# Patient Record
Sex: Female | Born: 1940 | Race: White | Hispanic: No | Marital: Married | Smoking: Former smoker
Health system: Southern US, Community
[De-identification: ages and names within clinical notes are randomized; demographics above are authoritative.]

## PROBLEM LIST (undated history)

## (undated) ENCOUNTER — Emergency Department (HOSPITAL_BASED_OUTPATIENT_CLINIC_OR_DEPARTMENT_OTHER): Disposition: A | Discharge status: Home / Self Care | Payer: PPO

## (undated) ENCOUNTER — Emergency Department (HOSPITAL_BASED_OUTPATIENT_CLINIC_OR_DEPARTMENT_OTHER): Admission: EM | Source: Home / Self Care

## (undated) ENCOUNTER — Ambulatory Visit: Admission: EM

## (undated) DIAGNOSIS — I951 Orthostatic hypotension: Secondary | ICD-10-CM

## (undated) DIAGNOSIS — M25562 Pain in left knee: Secondary | ICD-10-CM

## (undated) DIAGNOSIS — L729 Follicular cyst of the skin and subcutaneous tissue, unspecified: Secondary | ICD-10-CM

## (undated) DIAGNOSIS — R0600 Dyspnea, unspecified: Secondary | ICD-10-CM

## (undated) DIAGNOSIS — Z8041 Family history of malignant neoplasm of ovary: Secondary | ICD-10-CM

## (undated) DIAGNOSIS — Z9289 Personal history of other medical treatment: Secondary | ICD-10-CM

## (undated) DIAGNOSIS — M8580 Other specified disorders of bone density and structure, unspecified site: Secondary | ICD-10-CM

## (undated) DIAGNOSIS — R700 Elevated erythrocyte sedimentation rate: Secondary | ICD-10-CM

## (undated) DIAGNOSIS — T7840XA Allergy, unspecified, initial encounter: Secondary | ICD-10-CM

## (undated) DIAGNOSIS — J449 Chronic obstructive pulmonary disease, unspecified: Secondary | ICD-10-CM

## (undated) DIAGNOSIS — R350 Frequency of micturition: Secondary | ICD-10-CM

## (undated) DIAGNOSIS — J189 Pneumonia, unspecified organism: Secondary | ICD-10-CM

## (undated) DIAGNOSIS — Z8619 Personal history of other infectious and parasitic diseases: Secondary | ICD-10-CM

## (undated) DIAGNOSIS — I739 Peripheral vascular disease, unspecified: Secondary | ICD-10-CM

## (undated) DIAGNOSIS — R109 Unspecified abdominal pain: Secondary | ICD-10-CM

## (undated) DIAGNOSIS — M1990 Unspecified osteoarthritis, unspecified site: Secondary | ICD-10-CM

## (undated) DIAGNOSIS — K5520 Angiodysplasia of colon without hemorrhage: Secondary | ICD-10-CM

## (undated) DIAGNOSIS — J439 Emphysema, unspecified: Secondary | ICD-10-CM

## (undated) DIAGNOSIS — W19XXXA Unspecified fall, initial encounter: Secondary | ICD-10-CM

## (undated) DIAGNOSIS — R1310 Dysphagia, unspecified: Secondary | ICD-10-CM

## (undated) DIAGNOSIS — I779 Disorder of arteries and arterioles, unspecified: Secondary | ICD-10-CM

## (undated) DIAGNOSIS — K222 Esophageal obstruction: Secondary | ICD-10-CM

## (undated) DIAGNOSIS — E785 Hyperlipidemia, unspecified: Secondary | ICD-10-CM

## (undated) DIAGNOSIS — F419 Anxiety disorder, unspecified: Secondary | ICD-10-CM

## (undated) DIAGNOSIS — S52133A Displaced fracture of neck of unspecified radius, initial encounter for closed fracture: Secondary | ICD-10-CM

## (undated) DIAGNOSIS — R748 Abnormal levels of other serum enzymes: Secondary | ICD-10-CM

## (undated) DIAGNOSIS — L309 Dermatitis, unspecified: Secondary | ICD-10-CM

## (undated) DIAGNOSIS — C801 Malignant (primary) neoplasm, unspecified: Secondary | ICD-10-CM

## (undated) DIAGNOSIS — F329 Major depressive disorder, single episode, unspecified: Secondary | ICD-10-CM

## (undated) DIAGNOSIS — F1021 Alcohol dependence, in remission: Secondary | ICD-10-CM

## (undated) DIAGNOSIS — D689 Coagulation defect, unspecified: Secondary | ICD-10-CM

## (undated) DIAGNOSIS — Z800 Family history of malignant neoplasm of digestive organs: Secondary | ICD-10-CM

## (undated) DIAGNOSIS — R319 Hematuria, unspecified: Secondary | ICD-10-CM

## (undated) DIAGNOSIS — F1910 Other psychoactive substance abuse, uncomplicated: Secondary | ICD-10-CM

## (undated) DIAGNOSIS — J45909 Unspecified asthma, uncomplicated: Secondary | ICD-10-CM

## (undated) DIAGNOSIS — Z853 Personal history of malignant neoplasm of breast: Secondary | ICD-10-CM

## (undated) DIAGNOSIS — Z0000 Encounter for general adult medical examination without abnormal findings: Secondary | ICD-10-CM

## (undated) DIAGNOSIS — D649 Anemia, unspecified: Secondary | ICD-10-CM

## (undated) DIAGNOSIS — R0789 Other chest pain: Secondary | ICD-10-CM

## (undated) DIAGNOSIS — F0390 Unspecified dementia without behavioral disturbance: Secondary | ICD-10-CM

## (undated) DIAGNOSIS — K200 Eosinophilic esophagitis: Secondary | ICD-10-CM

## (undated) DIAGNOSIS — I493 Ventricular premature depolarization: Secondary | ICD-10-CM

## (undated) DIAGNOSIS — J069 Acute upper respiratory infection, unspecified: Secondary | ICD-10-CM

## (undated) DIAGNOSIS — J019 Acute sinusitis, unspecified: Secondary | ICD-10-CM

## (undated) DIAGNOSIS — M7120 Synovial cyst of popliteal space [Baker], unspecified knee: Secondary | ICD-10-CM

## (undated) DIAGNOSIS — N760 Acute vaginitis: Secondary | ICD-10-CM

## (undated) DIAGNOSIS — M810 Age-related osteoporosis without current pathological fracture: Secondary | ICD-10-CM

## (undated) DIAGNOSIS — M25561 Pain in right knee: Secondary | ICD-10-CM

## (undated) DIAGNOSIS — I10 Essential (primary) hypertension: Secondary | ICD-10-CM

## (undated) DIAGNOSIS — Z803 Family history of malignant neoplasm of breast: Secondary | ICD-10-CM

## (undated) DIAGNOSIS — K219 Gastro-esophageal reflux disease without esophagitis: Secondary | ICD-10-CM

## (undated) DIAGNOSIS — Z9221 Personal history of antineoplastic chemotherapy: Secondary | ICD-10-CM

## (undated) DIAGNOSIS — E782 Mixed hyperlipidemia: Secondary | ICD-10-CM

## (undated) DIAGNOSIS — T1490XA Injury, unspecified, initial encounter: Principal | ICD-10-CM

## (undated) DIAGNOSIS — Z923 Personal history of irradiation: Secondary | ICD-10-CM

## (undated) HISTORY — DX: Orthostatic hypotension: I95.1

## (undated) HISTORY — DX: Family history of malignant neoplasm of ovary: Z80.41

## (undated) HISTORY — DX: Family history of malignant neoplasm of breast: Z80.3

## (undated) HISTORY — DX: Coagulation defect, unspecified: D68.9

## (undated) HISTORY — PX: ESOPHAGOGASTRODUODENOSCOPY (EGD) WITH ESOPHAGEAL DILATION: SHX5812

## (undated) HISTORY — DX: Other specified disorders of bone density and structure, unspecified site: M85.80

## (undated) HISTORY — DX: Acute upper respiratory infection, unspecified: J06.9

## (undated) HISTORY — DX: Hyperlipidemia, unspecified: E78.5

## (undated) HISTORY — DX: Unspecified abdominal pain: R10.9

## (undated) HISTORY — DX: Dysphagia, unspecified: R13.10

## (undated) HISTORY — DX: Anxiety disorder, unspecified: F41.9

## (undated) HISTORY — PX: APPENDECTOMY: SHX54

## (undated) HISTORY — DX: Pain in left knee: M25.562

## (undated) HISTORY — DX: Elevated erythrocyte sedimentation rate: R70.0

## (undated) HISTORY — DX: Unspecified osteoarthritis, unspecified site: M19.90

## (undated) HISTORY — DX: Family history of malignant neoplasm of digestive organs: Z80.0

## (undated) HISTORY — DX: Allergy, unspecified, initial encounter: T78.40XA

## (undated) HISTORY — DX: Acute vaginitis: N76.0

## (undated) HISTORY — DX: Age-related osteoporosis without current pathological fracture: M81.0

## (undated) HISTORY — DX: Unspecified asthma, uncomplicated: J45.909

## (undated) HISTORY — DX: Other psychoactive substance abuse, uncomplicated: F19.10

## (undated) HISTORY — DX: Personal history of other medical treatment: Z92.89

## (undated) HISTORY — DX: Emphysema, unspecified: J43.9

## (undated) HISTORY — DX: Hematuria, unspecified: R31.9

## (undated) HISTORY — DX: Injury, unspecified, initial encounter: T14.90XA

## (undated) HISTORY — DX: Unspecified dementia, unspecified severity, without behavioral disturbance, psychotic disturbance, mood disturbance, and anxiety: F03.90

## (undated) HISTORY — DX: Angiodysplasia of colon without hemorrhage: K55.20

## (undated) HISTORY — DX: Follicular cyst of the skin and subcutaneous tissue, unspecified: L72.9

## (undated) HISTORY — DX: Unspecified fall, initial encounter: W19.XXXA

## (undated) HISTORY — DX: Esophageal obstruction: K22.2

## (undated) HISTORY — DX: Dermatitis, unspecified: L30.9

## (undated) HISTORY — DX: Personal history of malignant neoplasm of breast: Z85.3

## (undated) HISTORY — DX: Major depressive disorder, single episode, unspecified: F32.9

## (undated) HISTORY — DX: Synovial cyst of popliteal space (Baker), unspecified knee: M71.20

## (undated) HISTORY — DX: Encounter for general adult medical examination without abnormal findings: Z00.00

## (undated) HISTORY — DX: Disorder of arteries and arterioles, unspecified: I77.9

## (undated) HISTORY — PX: REDUCTION MAMMAPLASTY: SUR839

## (undated) HISTORY — DX: Frequency of micturition: R35.0

## (undated) HISTORY — DX: Chronic obstructive pulmonary disease, unspecified: J44.9

## (undated) HISTORY — DX: Gastro-esophageal reflux disease without esophagitis: K21.9

## (undated) HISTORY — DX: Anemia, unspecified: D64.9

## (undated) HISTORY — PX: BREAST RECONSTRUCTION: SHX9

## (undated) HISTORY — DX: Ventricular premature depolarization: I49.3

## (undated) HISTORY — DX: Other chest pain: R07.89

## (undated) HISTORY — DX: Acute sinusitis, unspecified: J01.90

## (undated) HISTORY — DX: Pain in right knee: M25.561

## (undated) HISTORY — DX: Abnormal levels of other serum enzymes: R74.8

## (undated) HISTORY — DX: Pneumonia, unspecified organism: J18.9

## (undated) HISTORY — DX: Peripheral vascular disease, unspecified: I73.9

## (undated) HISTORY — DX: Malignant (primary) neoplasm, unspecified: C80.1

## (undated) HISTORY — DX: Personal history of other infectious and parasitic diseases: Z86.19

## (undated) HISTORY — DX: Displaced fracture of neck of unspecified radius, initial encounter for closed fracture: S52.133A

## (undated) HISTORY — DX: Mixed hyperlipidemia: E78.2

## (undated) HISTORY — DX: Alcohol dependence, in remission: F10.21

---

## 1998-12-16 ENCOUNTER — Other Ambulatory Visit: Admission: RE | Admit: 1998-12-16 | Discharge: 1998-12-16 | Payer: Self-pay | Admitting: Family Medicine

## 1999-11-13 DIAGNOSIS — Z9221 Personal history of antineoplastic chemotherapy: Secondary | ICD-10-CM

## 1999-11-13 DIAGNOSIS — Z923 Personal history of irradiation: Secondary | ICD-10-CM

## 1999-11-13 HISTORY — PX: BREAST LUMPECTOMY: SHX2

## 1999-11-13 HISTORY — DX: Personal history of antineoplastic chemotherapy: Z92.21

## 1999-11-13 HISTORY — DX: Personal history of irradiation: Z92.3

## 2000-01-22 ENCOUNTER — Encounter: Payer: Self-pay | Admitting: Surgery

## 2000-01-22 ENCOUNTER — Encounter (INDEPENDENT_AMBULATORY_CARE_PROVIDER_SITE_OTHER): Payer: Self-pay

## 2000-01-22 ENCOUNTER — Ambulatory Visit (HOSPITAL_COMMUNITY): Admission: RE | Admit: 2000-01-22 | Discharge: 2000-01-22 | Payer: Self-pay | Admitting: Surgery

## 2000-02-09 ENCOUNTER — Encounter: Admission: RE | Admit: 2000-02-09 | Discharge: 2000-02-09 | Payer: Self-pay | Admitting: Family Medicine

## 2000-02-09 ENCOUNTER — Encounter: Payer: Self-pay | Admitting: Family Medicine

## 2000-02-16 ENCOUNTER — Ambulatory Visit (HOSPITAL_COMMUNITY): Admission: RE | Admit: 2000-02-16 | Discharge: 2000-02-16 | Payer: Self-pay | Admitting: Surgery

## 2000-02-16 ENCOUNTER — Encounter: Payer: Self-pay | Admitting: Surgery

## 2000-02-16 ENCOUNTER — Encounter (INDEPENDENT_AMBULATORY_CARE_PROVIDER_SITE_OTHER): Payer: Self-pay | Admitting: *Deleted

## 2000-07-17 ENCOUNTER — Observation Stay (HOSPITAL_COMMUNITY): Admission: RE | Admit: 2000-07-17 | Discharge: 2000-07-18 | Payer: Self-pay | Admitting: Oncology

## 2000-08-14 ENCOUNTER — Observation Stay (HOSPITAL_COMMUNITY): Admission: RE | Admit: 2000-08-14 | Discharge: 2000-08-15 | Payer: Self-pay | Admitting: Oncology

## 2000-08-16 ENCOUNTER — Encounter (HOSPITAL_COMMUNITY): Admission: RE | Admit: 2000-08-16 | Discharge: 2000-11-14 | Payer: Self-pay | Admitting: Oncology

## 2000-09-09 ENCOUNTER — Observation Stay (HOSPITAL_COMMUNITY): Admission: AD | Admit: 2000-09-09 | Discharge: 2000-09-10 | Payer: Self-pay | Admitting: Oncology

## 2000-10-07 ENCOUNTER — Encounter: Admission: RE | Admit: 2000-10-07 | Discharge: 2001-01-05 | Payer: Self-pay | Admitting: Radiation Oncology

## 2000-12-31 ENCOUNTER — Other Ambulatory Visit: Admission: RE | Admit: 2000-12-31 | Discharge: 2000-12-31 | Payer: Self-pay | Admitting: Obstetrics and Gynecology

## 2001-01-06 ENCOUNTER — Encounter: Admission: RE | Admit: 2001-01-06 | Discharge: 2001-04-06 | Payer: Self-pay | Admitting: Radiation Oncology

## 2001-04-11 ENCOUNTER — Encounter: Admission: RE | Admit: 2001-04-11 | Discharge: 2001-04-11 | Payer: Self-pay | Admitting: Oncology

## 2001-04-11 ENCOUNTER — Encounter: Payer: Self-pay | Admitting: Oncology

## 2001-04-11 ENCOUNTER — Ambulatory Visit (HOSPITAL_COMMUNITY): Admission: RE | Admit: 2001-04-11 | Discharge: 2001-04-11 | Payer: Self-pay | Admitting: Oncology

## 2001-04-16 ENCOUNTER — Ambulatory Visit (HOSPITAL_COMMUNITY): Admission: RE | Admit: 2001-04-16 | Discharge: 2001-04-16 | Payer: Self-pay | Admitting: Oncology

## 2001-04-16 ENCOUNTER — Encounter: Payer: Self-pay | Admitting: Oncology

## 2001-04-23 ENCOUNTER — Ambulatory Visit: Admission: RE | Admit: 2001-04-23 | Discharge: 2001-07-22 | Payer: Self-pay | Admitting: *Deleted

## 2002-07-21 ENCOUNTER — Encounter: Admission: RE | Admit: 2002-07-21 | Discharge: 2002-07-21 | Payer: Self-pay | Admitting: Family Medicine

## 2002-07-21 ENCOUNTER — Encounter: Payer: Self-pay | Admitting: Family Medicine

## 2003-07-24 ENCOUNTER — Encounter: Payer: Self-pay | Admitting: Emergency Medicine

## 2003-07-24 ENCOUNTER — Emergency Department (HOSPITAL_COMMUNITY): Admission: EM | Admit: 2003-07-24 | Discharge: 2003-07-25 | Payer: Self-pay | Admitting: Emergency Medicine

## 2003-11-16 ENCOUNTER — Encounter: Admission: RE | Admit: 2003-11-16 | Discharge: 2003-11-16 | Payer: Self-pay

## 2004-02-01 ENCOUNTER — Ambulatory Visit (HOSPITAL_COMMUNITY): Admission: RE | Admit: 2004-02-01 | Discharge: 2004-02-01 | Payer: Self-pay | Admitting: Oncology

## 2004-10-24 ENCOUNTER — Encounter: Admission: RE | Admit: 2004-10-24 | Discharge: 2004-10-24 | Payer: Self-pay | Admitting: Oncology

## 2004-11-22 ENCOUNTER — Ambulatory Visit: Payer: Self-pay | Admitting: Oncology

## 2004-11-22 ENCOUNTER — Encounter: Admission: RE | Admit: 2004-11-22 | Discharge: 2004-11-22 | Payer: Self-pay | Admitting: Oncology

## 2005-05-24 ENCOUNTER — Ambulatory Visit: Payer: Self-pay | Admitting: Oncology

## 2005-05-25 ENCOUNTER — Ambulatory Visit (HOSPITAL_COMMUNITY): Admission: RE | Admit: 2005-05-25 | Discharge: 2005-05-25 | Payer: Self-pay | Admitting: Oncology

## 2005-05-30 ENCOUNTER — Encounter: Admission: RE | Admit: 2005-05-30 | Discharge: 2005-05-30 | Payer: Self-pay | Admitting: Oncology

## 2005-07-12 ENCOUNTER — Ambulatory Visit: Payer: Self-pay | Admitting: Oncology

## 2005-09-03 ENCOUNTER — Ambulatory Visit: Payer: Self-pay | Admitting: Oncology

## 2005-12-11 ENCOUNTER — Encounter: Admission: RE | Admit: 2005-12-11 | Discharge: 2005-12-11 | Payer: Self-pay | Admitting: Oncology

## 2006-04-29 ENCOUNTER — Ambulatory Visit: Payer: Self-pay | Admitting: Oncology

## 2006-05-01 LAB — CBC WITH DIFFERENTIAL/PLATELET
BASO%: 0.1 % (ref 0.0–2.0)
Basophils Absolute: 0 10*3/uL (ref 0.0–0.1)
EOS%: 5 % (ref 0.0–7.0)
HCT: 38.7 % (ref 34.8–46.6)
LYMPH%: 19.9 % (ref 14.0–48.0)
MCH: 31.5 pg (ref 26.0–34.0)
MCHC: 34.1 g/dL (ref 32.0–36.0)
MCV: 92.3 fL (ref 81.0–101.0)
MONO%: 8.3 % (ref 0.0–13.0)
NEUT%: 66.7 % (ref 39.6–76.8)
Platelets: 251 10*3/uL (ref 145–400)
lymph#: 1.3 10*3/uL (ref 0.9–3.3)

## 2006-05-01 LAB — COMPREHENSIVE METABOLIC PANEL
Albumin: 3.8 g/dL (ref 3.5–5.2)
Alkaline Phosphatase: 80 U/L (ref 39–117)
BUN: 24 mg/dL — ABNORMAL HIGH (ref 6–23)
Creatinine, Ser: 0.72 mg/dL (ref 0.40–1.20)
Glucose, Bld: 89 mg/dL (ref 70–99)
Total Bilirubin: 0.4 mg/dL (ref 0.3–1.2)

## 2006-07-29 ENCOUNTER — Ambulatory Visit: Payer: Self-pay | Admitting: Oncology

## 2006-10-25 ENCOUNTER — Ambulatory Visit: Payer: Self-pay | Admitting: Oncology

## 2006-10-30 LAB — COMPREHENSIVE METABOLIC PANEL
ALT: 17 U/L (ref 0–35)
AST: 15 U/L (ref 0–37)
Alkaline Phosphatase: 99 U/L (ref 39–117)
Calcium: 9.4 mg/dL (ref 8.4–10.5)
Chloride: 103 mEq/L (ref 96–112)
Creatinine, Ser: 0.72 mg/dL (ref 0.40–1.20)

## 2006-10-30 LAB — CBC WITH DIFFERENTIAL/PLATELET
BASO%: 0.5 % (ref 0.0–2.0)
EOS%: 2.6 % (ref 0.0–7.0)
HCT: 41.3 % (ref 34.8–46.6)
MCH: 31.2 pg (ref 26.0–34.0)
MCHC: 33.8 g/dL (ref 32.0–36.0)
MONO%: 7.4 % (ref 0.0–13.0)
NEUT%: 58.3 % (ref 39.6–76.8)
RDW: 13.4 % (ref 11.3–14.5)
lymph#: 1.7 10*3/uL (ref 0.9–3.3)

## 2006-11-12 DIAGNOSIS — C801 Malignant (primary) neoplasm, unspecified: Secondary | ICD-10-CM

## 2006-11-12 HISTORY — DX: Malignant (primary) neoplasm, unspecified: C80.1

## 2006-12-12 ENCOUNTER — Encounter: Admission: RE | Admit: 2006-12-12 | Discharge: 2006-12-12 | Payer: Self-pay | Admitting: Oncology

## 2006-12-26 ENCOUNTER — Encounter (INDEPENDENT_AMBULATORY_CARE_PROVIDER_SITE_OTHER): Payer: Self-pay | Admitting: *Deleted

## 2006-12-26 ENCOUNTER — Encounter: Admission: RE | Admit: 2006-12-26 | Discharge: 2006-12-26 | Payer: Self-pay | Admitting: Oncology

## 2006-12-26 HISTORY — PX: BREAST BIOPSY: SHX20

## 2007-01-02 ENCOUNTER — Encounter: Admission: RE | Admit: 2007-01-02 | Discharge: 2007-01-02 | Payer: Self-pay | Admitting: Oncology

## 2007-01-02 ENCOUNTER — Encounter (INDEPENDENT_AMBULATORY_CARE_PROVIDER_SITE_OTHER): Payer: Self-pay | Admitting: Specialist

## 2007-01-02 ENCOUNTER — Encounter (INDEPENDENT_AMBULATORY_CARE_PROVIDER_SITE_OTHER): Payer: Self-pay | Admitting: Diagnostic Radiology

## 2007-01-02 HISTORY — PX: BREAST BIOPSY: SHX20

## 2007-01-23 ENCOUNTER — Ambulatory Visit (HOSPITAL_COMMUNITY): Admission: RE | Admit: 2007-01-23 | Discharge: 2007-01-23 | Payer: Self-pay | Admitting: Surgery

## 2007-01-29 ENCOUNTER — Ambulatory Visit: Payer: Self-pay | Admitting: Oncology

## 2007-04-23 ENCOUNTER — Ambulatory Visit: Payer: Self-pay | Admitting: Psychiatry

## 2007-05-01 ENCOUNTER — Ambulatory Visit: Payer: Self-pay | Admitting: Psychiatry

## 2007-05-07 ENCOUNTER — Ambulatory Visit: Payer: Self-pay | Admitting: Psychiatry

## 2007-05-27 HISTORY — PX: AUGMENTATION MAMMAPLASTY: SUR837

## 2007-05-27 HISTORY — PX: MASTECTOMY MODIFIED RADICAL: SUR848

## 2007-06-06 ENCOUNTER — Inpatient Hospital Stay (HOSPITAL_COMMUNITY): Admission: RE | Admit: 2007-06-06 | Discharge: 2007-06-09 | Payer: Self-pay | Admitting: Surgery

## 2007-06-06 ENCOUNTER — Encounter (INDEPENDENT_AMBULATORY_CARE_PROVIDER_SITE_OTHER): Payer: Self-pay | Admitting: Surgery

## 2007-06-16 ENCOUNTER — Ambulatory Visit: Payer: Self-pay | Admitting: Oncology

## 2007-07-01 LAB — POC HEMOCCULT BLD/STL (HOME/3-CARD/SCREEN)

## 2007-08-07 ENCOUNTER — Ambulatory Visit: Payer: Self-pay | Admitting: Oncology

## 2007-08-12 LAB — CBC WITH DIFFERENTIAL/PLATELET
Basophils Absolute: 0 10*3/uL (ref 0.0–0.1)
Eosinophils Absolute: 0.2 10*3/uL (ref 0.0–0.5)
HCT: 31.9 % — ABNORMAL LOW (ref 34.8–46.6)
HGB: 11.1 g/dL — ABNORMAL LOW (ref 11.6–15.9)
MCV: 88 fL (ref 81.0–101.0)
MONO%: 5.3 % (ref 0.0–13.0)
NEUT#: 5.8 10*3/uL (ref 1.5–6.5)
Platelets: 464 10*3/uL — ABNORMAL HIGH (ref 145–400)
RDW: 13.9 % (ref 11.3–14.5)

## 2007-08-12 LAB — COMPREHENSIVE METABOLIC PANEL
Albumin: 3.6 g/dL (ref 3.5–5.2)
Alkaline Phosphatase: 173 U/L — ABNORMAL HIGH (ref 39–117)
BUN: 15 mg/dL (ref 6–23)
Calcium: 9.1 mg/dL (ref 8.4–10.5)
Glucose, Bld: 88 mg/dL (ref 70–99)
Potassium: 3.8 mEq/L (ref 3.5–5.3)

## 2007-08-12 LAB — CANCER ANTIGEN 27.29: CA 27.29: 16 U/mL (ref 0–39)

## 2007-10-02 ENCOUNTER — Ambulatory Visit: Payer: Self-pay | Admitting: Oncology

## 2007-10-06 LAB — COMPREHENSIVE METABOLIC PANEL
AST: 14 U/L (ref 0–37)
Albumin: 4.2 g/dL (ref 3.5–5.2)
Alkaline Phosphatase: 132 U/L — ABNORMAL HIGH (ref 39–117)
Glucose, Bld: 93 mg/dL (ref 70–99)
Potassium: 4.1 mEq/L (ref 3.5–5.3)
Sodium: 140 mEq/L (ref 135–145)
Total Bilirubin: 0.3 mg/dL (ref 0.3–1.2)
Total Protein: 7.1 g/dL (ref 6.0–8.3)

## 2007-10-06 LAB — CBC WITH DIFFERENTIAL/PLATELET
BASO%: 0.3 % (ref 0.0–2.0)
MCHC: 34.1 g/dL (ref 32.0–36.0)
MONO#: 0.4 10*3/uL (ref 0.1–0.9)
RBC: 4.29 10*6/uL (ref 3.70–5.32)
RDW: 14.4 % (ref 11.3–14.5)
WBC: 6.4 10*3/uL (ref 3.9–10.0)
lymph#: 1.7 10*3/uL (ref 0.9–3.3)

## 2007-11-17 ENCOUNTER — Encounter (INDEPENDENT_AMBULATORY_CARE_PROVIDER_SITE_OTHER): Payer: Self-pay | Admitting: Specialist

## 2007-11-17 ENCOUNTER — Ambulatory Visit (HOSPITAL_BASED_OUTPATIENT_CLINIC_OR_DEPARTMENT_OTHER): Admission: RE | Admit: 2007-11-17 | Discharge: 2007-11-17 | Payer: Self-pay | Admitting: Specialist

## 2007-12-04 ENCOUNTER — Ambulatory Visit: Payer: Self-pay | Admitting: Oncology

## 2007-12-09 LAB — CBC WITH DIFFERENTIAL/PLATELET
Basophils Absolute: 0.1 10*3/uL (ref 0.0–0.1)
EOS%: 7 % (ref 0.0–7.0)
Eosinophils Absolute: 0.4 10*3/uL (ref 0.0–0.5)
LYMPH%: 26.9 % (ref 14.0–48.0)
MCH: 29.1 pg (ref 26.0–34.0)
MCV: 86.5 fL (ref 81.0–101.0)
MONO%: 6.2 % (ref 0.0–13.0)
NEUT#: 3.3 10*3/uL (ref 1.5–6.5)
Platelets: 380 10*3/uL (ref 145–400)
RBC: 4.18 10*6/uL (ref 3.70–5.32)

## 2007-12-09 LAB — COMPREHENSIVE METABOLIC PANEL
AST: 15 U/L (ref 0–37)
Alkaline Phosphatase: 149 U/L — ABNORMAL HIGH (ref 39–117)
BUN: 25 mg/dL — ABNORMAL HIGH (ref 6–23)
Glucose, Bld: 88 mg/dL (ref 70–99)
Sodium: 141 mEq/L (ref 135–145)
Total Bilirubin: 0.3 mg/dL (ref 0.3–1.2)

## 2007-12-15 ENCOUNTER — Encounter: Admission: RE | Admit: 2007-12-15 | Discharge: 2007-12-15 | Payer: Self-pay | Admitting: Oncology

## 2008-01-29 ENCOUNTER — Ambulatory Visit: Payer: Self-pay | Admitting: Oncology

## 2008-02-02 LAB — COMPREHENSIVE METABOLIC PANEL
ALT: 14 U/L (ref 0–35)
AST: 16 U/L (ref 0–37)
Alkaline Phosphatase: 112 U/L (ref 39–117)
Chloride: 102 mEq/L (ref 96–112)
Creatinine, Ser: 0.76 mg/dL (ref 0.40–1.20)
Total Bilirubin: 0.3 mg/dL (ref 0.3–1.2)

## 2008-02-02 LAB — CBC WITH DIFFERENTIAL/PLATELET
BASO%: 0.6 % (ref 0.0–2.0)
EOS%: 2.6 % (ref 0.0–7.0)
HCT: 36.3 % (ref 34.8–46.6)
LYMPH%: 26.7 % (ref 14.0–48.0)
MCH: 30.8 pg (ref 26.0–34.0)
MCHC: 35 g/dL (ref 32.0–36.0)
NEUT%: 61.4 % (ref 39.6–76.8)
Platelets: 260 10*3/uL (ref 145–400)

## 2008-02-20 ENCOUNTER — Encounter: Admission: RE | Admit: 2008-02-20 | Discharge: 2008-02-20 | Payer: Self-pay | Admitting: Oncology

## 2008-06-06 ENCOUNTER — Ambulatory Visit: Payer: Self-pay | Admitting: Oncology

## 2008-06-11 LAB — CBC WITH DIFFERENTIAL/PLATELET
BASO%: 0.4 % (ref 0.0–2.0)
EOS%: 3 % (ref 0.0–7.0)
LYMPH%: 22.4 % (ref 14.0–48.0)
MCH: 30.9 pg (ref 26.0–34.0)
MCHC: 34.2 g/dL (ref 32.0–36.0)
MCV: 90.4 fL (ref 81.0–101.0)
MONO%: 6 % (ref 0.0–13.0)
Platelets: 251 10*3/uL (ref 145–400)
RBC: 4.22 10*6/uL (ref 3.70–5.32)
WBC: 5.6 10*3/uL (ref 3.9–10.0)

## 2008-06-11 LAB — COMPREHENSIVE METABOLIC PANEL
ALT: 16 U/L (ref 0–35)
Alkaline Phosphatase: 101 U/L (ref 39–117)
Sodium: 140 mEq/L (ref 135–145)
Total Bilirubin: 0.4 mg/dL (ref 0.3–1.2)
Total Protein: 7.1 g/dL (ref 6.0–8.3)

## 2008-07-13 ENCOUNTER — Encounter: Admission: RE | Admit: 2008-07-13 | Discharge: 2008-07-13 | Payer: Self-pay | Admitting: Oncology

## 2008-07-16 LAB — CBC WITH DIFFERENTIAL/PLATELET
BASO%: 0.5 % (ref 0.0–2.0)
LYMPH%: 28.8 % (ref 14.0–48.0)
MCHC: 33.9 g/dL (ref 32.0–36.0)
MONO#: 0.4 10*3/uL (ref 0.1–0.9)
Platelets: 258 10*3/uL (ref 145–400)
RBC: 4.05 10*6/uL (ref 3.70–5.32)
WBC: 5.2 10*3/uL (ref 3.9–10.0)

## 2008-07-16 LAB — COMPREHENSIVE METABOLIC PANEL
ALT: 14 U/L (ref 0–35)
Alkaline Phosphatase: 110 U/L (ref 39–117)
CO2: 30 mEq/L (ref 19–32)
Sodium: 140 mEq/L (ref 135–145)
Total Bilirubin: 0.4 mg/dL (ref 0.3–1.2)
Total Protein: 7 g/dL (ref 6.0–8.3)

## 2008-07-22 ENCOUNTER — Ambulatory Visit: Payer: Self-pay | Admitting: Oncology

## 2008-08-13 ENCOUNTER — Ambulatory Visit (HOSPITAL_COMMUNITY): Admission: RE | Admit: 2008-08-13 | Discharge: 2008-08-13 | Payer: Self-pay | Admitting: Oncology

## 2008-09-16 ENCOUNTER — Ambulatory Visit: Payer: Self-pay | Admitting: Oncology

## 2008-09-27 ENCOUNTER — Ambulatory Visit: Payer: Self-pay | Admitting: Internal Medicine

## 2008-10-06 ENCOUNTER — Ambulatory Visit: Payer: Self-pay | Admitting: Internal Medicine

## 2008-10-06 LAB — CONVERTED CEMR LAB
Cholesterol: 252 mg/dL (ref 0–200)
HDL: 67 mg/dL (ref >39.0)
VLDL: 16 mg/dL (ref 0–40)

## 2008-11-12 HISTORY — PX: ERCP: SHX60

## 2008-11-12 HISTORY — PX: CHOLECYSTECTOMY: SHX55

## 2008-12-08 ENCOUNTER — Ambulatory Visit: Payer: Self-pay | Admitting: Internal Medicine

## 2008-12-08 LAB — CONVERTED CEMR LAB: AST: 14 units/L (ref 0–37)

## 2008-12-13 DIAGNOSIS — J189 Pneumonia, unspecified organism: Secondary | ICD-10-CM

## 2008-12-13 HISTORY — DX: Pneumonia, unspecified organism: J18.9

## 2008-12-15 ENCOUNTER — Encounter: Admission: RE | Admit: 2008-12-15 | Discharge: 2008-12-15 | Payer: Self-pay | Admitting: Oncology

## 2008-12-16 ENCOUNTER — Encounter: Admission: RE | Admit: 2008-12-16 | Discharge: 2008-12-16 | Payer: Self-pay | Admitting: Oncology

## 2008-12-17 ENCOUNTER — Ambulatory Visit: Payer: Self-pay | Admitting: Oncology

## 2008-12-21 ENCOUNTER — Encounter: Payer: Self-pay | Admitting: Internal Medicine

## 2008-12-21 LAB — COMPREHENSIVE METABOLIC PANEL
ALT: 43 U/L — ABNORMAL HIGH (ref 0–35)
Albumin: 4 g/dL (ref 3.5–5.2)
CO2: 29 mEq/L (ref 19–32)
Calcium: 9 mg/dL (ref 8.4–10.5)
Chloride: 100 mEq/L (ref 96–112)
Potassium: 4.1 mEq/L (ref 3.5–5.3)
Sodium: 139 mEq/L (ref 135–145)
Total Bilirubin: 0.3 mg/dL (ref 0.3–1.2)
Total Protein: 6.9 g/dL (ref 6.0–8.3)

## 2008-12-21 LAB — CBC WITH DIFFERENTIAL/PLATELET
BASO%: 0.1 % (ref 0.0–2.0)
MCHC: 34 g/dL (ref 32.0–36.0)
MONO#: 0.7 10*3/uL (ref 0.1–0.9)
RBC: 3.85 10*6/uL (ref 3.70–5.32)
RDW: 13.8 % (ref 11.3–14.5)
WBC: 9 10*3/uL (ref 3.9–10.0)
lymph#: 1.4 10*3/uL (ref 0.9–3.3)

## 2008-12-23 ENCOUNTER — Inpatient Hospital Stay (HOSPITAL_COMMUNITY): Admission: EM | Admit: 2008-12-23 | Discharge: 2008-12-27 | Payer: Self-pay | Admitting: Emergency Medicine

## 2009-01-11 ENCOUNTER — Encounter: Payer: Self-pay | Admitting: Internal Medicine

## 2009-01-11 LAB — COMPREHENSIVE METABOLIC PANEL
ALT: 26 U/L (ref 0–35)
BUN: 10 mg/dL (ref 6–23)
CO2: 32 mEq/L (ref 19–32)
Calcium: 9 mg/dL (ref 8.4–10.5)
Chloride: 102 mEq/L (ref 96–112)
Creatinine, Ser: 0.68 mg/dL (ref 0.40–1.20)
Glucose, Bld: 108 mg/dL — ABNORMAL HIGH (ref 70–99)
Total Bilirubin: 0.4 mg/dL (ref 0.3–1.2)

## 2009-01-12 ENCOUNTER — Encounter: Admission: RE | Admit: 2009-01-12 | Discharge: 2009-01-12 | Payer: Self-pay | Admitting: Family Medicine

## 2009-01-13 ENCOUNTER — Ambulatory Visit: Payer: Self-pay

## 2009-02-07 ENCOUNTER — Ambulatory Visit: Payer: Self-pay | Admitting: Oncology

## 2009-02-10 ENCOUNTER — Encounter: Payer: Self-pay | Admitting: Internal Medicine

## 2009-02-10 LAB — CANCER ANTIGEN 27.29: CA 27.29: 15 U/mL (ref 0–39)

## 2009-02-10 LAB — COMPREHENSIVE METABOLIC PANEL
ALT: 113 U/L — ABNORMAL HIGH (ref 0–35)
AST: 24 U/L (ref 0–37)
Albumin: 3.8 g/dL (ref 3.5–5.2)
Alkaline Phosphatase: 246 U/L — ABNORMAL HIGH (ref 39–117)
Glucose, Bld: 84 mg/dL (ref 70–99)
Potassium: 3.6 mEq/L (ref 3.5–5.3)
Sodium: 138 mEq/L (ref 135–145)
Total Bilirubin: 0.3 mg/dL (ref 0.3–1.2)
Total Protein: 7.1 g/dL (ref 6.0–8.3)

## 2009-02-17 ENCOUNTER — Ambulatory Visit (HOSPITAL_COMMUNITY): Admission: RE | Admit: 2009-02-17 | Discharge: 2009-02-17 | Payer: Self-pay | Admitting: Oncology

## 2009-02-17 ENCOUNTER — Encounter (INDEPENDENT_AMBULATORY_CARE_PROVIDER_SITE_OTHER): Payer: Self-pay | Admitting: *Deleted

## 2009-03-15 ENCOUNTER — Ambulatory Visit: Payer: Self-pay | Admitting: Internal Medicine

## 2009-03-15 DIAGNOSIS — R748 Abnormal levels of other serum enzymes: Secondary | ICD-10-CM

## 2009-03-15 DIAGNOSIS — R079 Chest pain, unspecified: Secondary | ICD-10-CM

## 2009-03-15 HISTORY — DX: Abnormal levels of other serum enzymes: R74.8

## 2009-03-18 ENCOUNTER — Telehealth: Payer: Self-pay | Admitting: Internal Medicine

## 2009-03-21 ENCOUNTER — Telehealth: Payer: Self-pay | Admitting: Internal Medicine

## 2009-03-22 ENCOUNTER — Encounter: Admission: RE | Admit: 2009-03-22 | Discharge: 2009-03-22 | Payer: Self-pay | Admitting: Internal Medicine

## 2009-03-22 ENCOUNTER — Telehealth: Payer: Self-pay | Admitting: Internal Medicine

## 2009-03-29 ENCOUNTER — Ambulatory Visit (HOSPITAL_COMMUNITY): Admission: RE | Admit: 2009-03-29 | Discharge: 2009-03-29 | Payer: Self-pay | Admitting: Internal Medicine

## 2009-03-29 ENCOUNTER — Ambulatory Visit: Payer: Self-pay | Admitting: Internal Medicine

## 2009-03-29 DIAGNOSIS — K222 Esophageal obstruction: Secondary | ICD-10-CM

## 2009-03-29 HISTORY — DX: Esophageal obstruction: K22.2

## 2009-03-30 ENCOUNTER — Telehealth: Payer: Self-pay | Admitting: Internal Medicine

## 2009-04-07 ENCOUNTER — Telehealth: Payer: Self-pay | Admitting: Internal Medicine

## 2009-04-07 ENCOUNTER — Ambulatory Visit: Payer: Self-pay | Admitting: Gastroenterology

## 2009-04-07 LAB — CONVERTED CEMR LAB
ALT: 33 units/L (ref 0–35)
Alkaline Phosphatase: 211 units/L — ABNORMAL HIGH (ref 39–117)
Basophils Absolute: 0 10*3/uL (ref 0.0–0.1)
HCT: 34.4 % — ABNORMAL LOW (ref 36.0–46.0)
Lymphs Abs: 1.1 10*3/uL (ref 0.7–4.0)
MCV: 86.1 fL (ref 78.0–100.0)
Monocytes Absolute: 0.6 10*3/uL (ref 0.1–1.0)
Platelets: 246 10*3/uL (ref 150.0–400.0)
RDW: 14.4 % (ref 11.5–14.6)
Sodium: 140 meq/L (ref 135–145)
Total Bilirubin: 0.6 mg/dL (ref 0.3–1.2)
Total Protein: 7.2 g/dL (ref 6.0–8.3)

## 2009-04-08 ENCOUNTER — Inpatient Hospital Stay (HOSPITAL_COMMUNITY): Admission: AD | Admit: 2009-04-08 | Discharge: 2009-04-11 | Payer: Self-pay | Admitting: General Surgery

## 2009-04-08 ENCOUNTER — Encounter (INDEPENDENT_AMBULATORY_CARE_PROVIDER_SITE_OTHER): Payer: Self-pay | Admitting: General Surgery

## 2009-04-08 ENCOUNTER — Encounter: Payer: Self-pay | Admitting: Internal Medicine

## 2009-04-08 ENCOUNTER — Ambulatory Visit (HOSPITAL_COMMUNITY): Admission: RE | Admit: 2009-04-08 | Discharge: 2009-04-08 | Payer: Self-pay | Admitting: Gastroenterology

## 2009-04-12 ENCOUNTER — Telehealth: Payer: Self-pay | Admitting: Internal Medicine

## 2009-04-22 ENCOUNTER — Encounter: Payer: Self-pay | Admitting: Internal Medicine

## 2009-04-29 ENCOUNTER — Ambulatory Visit: Payer: Self-pay | Admitting: Oncology

## 2009-05-04 ENCOUNTER — Encounter: Payer: Self-pay | Admitting: Internal Medicine

## 2009-05-04 LAB — CBC WITH DIFFERENTIAL/PLATELET
BASO%: 0.6 % (ref 0.0–2.0)
Basophils Absolute: 0 10*3/uL (ref 0.0–0.1)
Eosinophils Absolute: 0.2 10*3/uL (ref 0.0–0.5)
HCT: 33.5 % — ABNORMAL LOW (ref 34.8–46.6)
HGB: 11.7 g/dL (ref 11.6–15.9)
MCHC: 35 g/dL (ref 31.5–36.0)
MONO#: 0.4 10*3/uL (ref 0.1–0.9)
NEUT#: 3.4 10*3/uL (ref 1.5–6.5)
NEUT%: 64 % (ref 38.4–76.8)
WBC: 5.3 10*3/uL (ref 3.9–10.3)
lymph#: 1.3 10*3/uL (ref 0.9–3.3)

## 2009-05-04 LAB — COMPREHENSIVE METABOLIC PANEL
ALT: 11 U/L (ref 0–35)
Albumin: 3.9 g/dL (ref 3.5–5.2)
CO2: 28 mEq/L (ref 19–32)
Calcium: 9.3 mg/dL (ref 8.4–10.5)
Chloride: 105 mEq/L (ref 96–112)
Glucose, Bld: 75 mg/dL (ref 70–99)
Sodium: 143 mEq/L (ref 135–145)
Total Bilirubin: 0.3 mg/dL (ref 0.3–1.2)
Total Protein: 6.8 g/dL (ref 6.0–8.3)

## 2009-05-20 ENCOUNTER — Ambulatory Visit: Payer: Self-pay | Admitting: Internal Medicine

## 2009-06-22 ENCOUNTER — Ambulatory Visit: Payer: Self-pay | Admitting: Oncology

## 2009-07-12 ENCOUNTER — Telehealth: Payer: Self-pay | Admitting: Internal Medicine

## 2009-07-14 ENCOUNTER — Ambulatory Visit: Payer: Self-pay | Admitting: Oncology

## 2009-07-14 LAB — CEA: CEA: 1.2 ng/mL (ref 0.0–5.0)

## 2009-07-14 LAB — CBC WITH DIFFERENTIAL/PLATELET
BASO%: 0.5 % (ref 0.0–2.0)
Basophils Absolute: 0 10e3/uL (ref 0.0–0.1)
EOS%: 3.1 % (ref 0.0–7.0)
Eosinophils Absolute: 0.2 10e3/uL (ref 0.0–0.5)
HCT: 34.2 % — ABNORMAL LOW (ref 34.8–46.6)
HGB: 11.6 g/dL (ref 11.6–15.9)
LYMPH%: 17.9 % (ref 14.0–49.7)
MCH: 30.4 pg (ref 25.1–34.0)
MCHC: 33.9 g/dL (ref 31.5–36.0)
MCV: 89.6 fL (ref 79.5–101.0)
MONO#: 0.3 10e3/uL (ref 0.1–0.9)
MONO%: 5.9 % (ref 0.0–14.0)
NEUT#: 3.6 10e3/uL (ref 1.5–6.5)
NEUT%: 72.6 % (ref 38.4–76.8)
Platelets: 220 10e3/uL (ref 145–400)
RBC: 3.81 10e6/uL (ref 3.70–5.45)
RDW: 15.1 % — ABNORMAL HIGH (ref 11.2–14.5)
WBC: 5 10e3/uL (ref 3.9–10.3)
lymph#: 0.9 10e3/uL (ref 0.9–3.3)

## 2009-07-14 LAB — COMPREHENSIVE METABOLIC PANEL WITH GFR
ALT: 14 U/L (ref 0–35)
AST: 14 U/L (ref 0–37)
Albumin: 3.9 g/dL (ref 3.5–5.2)
Alkaline Phosphatase: 127 U/L — ABNORMAL HIGH (ref 39–117)
BUN: 15 mg/dL (ref 6–23)
CO2: 28 meq/L (ref 19–32)
Calcium: 9.1 mg/dL (ref 8.4–10.5)
Chloride: 103 meq/L (ref 96–112)
Creatinine, Ser: 0.7 mg/dL (ref 0.40–1.20)
Glucose, Bld: 104 mg/dL — ABNORMAL HIGH (ref 70–99)
Potassium: 4 meq/L (ref 3.5–5.3)
Sodium: 140 meq/L (ref 135–145)
Total Bilirubin: 0.3 mg/dL (ref 0.3–1.2)
Total Protein: 6.7 g/dL (ref 6.0–8.3)

## 2009-07-14 LAB — CANCER ANTIGEN 27.29: CA 27.29: 22 U/mL (ref 0–39)

## 2009-07-25 ENCOUNTER — Ambulatory Visit (HOSPITAL_COMMUNITY): Admission: RE | Admit: 2009-07-25 | Discharge: 2009-07-25 | Payer: Self-pay | Admitting: Oncology

## 2009-08-02 ENCOUNTER — Telehealth: Payer: Self-pay | Admitting: Internal Medicine

## 2009-08-09 ENCOUNTER — Encounter (INDEPENDENT_AMBULATORY_CARE_PROVIDER_SITE_OTHER): Payer: Self-pay | Admitting: Surgery

## 2009-08-09 ENCOUNTER — Ambulatory Visit (HOSPITAL_BASED_OUTPATIENT_CLINIC_OR_DEPARTMENT_OTHER): Admission: RE | Admit: 2009-08-09 | Discharge: 2009-08-09 | Payer: Self-pay | Admitting: Surgery

## 2009-08-16 ENCOUNTER — Ambulatory Visit: Payer: Self-pay | Admitting: Oncology

## 2009-09-01 ENCOUNTER — Encounter: Admission: RE | Admit: 2009-09-01 | Discharge: 2009-09-01 | Payer: Self-pay | Admitting: Surgery

## 2009-09-09 LAB — CBC WITH DIFFERENTIAL/PLATELET
BASO%: 0.3 % (ref 0.0–2.0)
Eosinophils Absolute: 0.1 10*3/uL (ref 0.0–0.5)
LYMPH%: 28.2 % (ref 14.0–49.7)
MCHC: 34 g/dL (ref 31.5–36.0)
MONO#: 0.4 10*3/uL (ref 0.1–0.9)
NEUT#: 4 10*3/uL (ref 1.5–6.5)
RBC: 4.11 10*6/uL (ref 3.70–5.45)
RDW: 14.9 % — ABNORMAL HIGH (ref 11.2–14.5)
WBC: 6.3 10*3/uL (ref 3.9–10.3)
lymph#: 1.8 10*3/uL (ref 0.9–3.3)

## 2009-09-13 LAB — PULMONARY FUNCTION TEST

## 2009-09-16 ENCOUNTER — Ambulatory Visit: Payer: Self-pay | Admitting: Oncology

## 2009-09-16 LAB — CBC WITH DIFFERENTIAL/PLATELET
BASO%: 0.3 % (ref 0.0–2.0)
Eosinophils Absolute: 0.1 10*3/uL (ref 0.0–0.5)
LYMPH%: 27.4 % (ref 14.0–49.7)
MCHC: 33.5 g/dL (ref 31.5–36.0)
MONO#: 0.3 10*3/uL (ref 0.1–0.9)
NEUT#: 4.2 10*3/uL (ref 1.5–6.5)
Platelets: 254 10*3/uL (ref 145–400)
RBC: 4.08 10*6/uL (ref 3.70–5.45)
RDW: 15 % — ABNORMAL HIGH (ref 11.2–14.5)
WBC: 6.4 10*3/uL (ref 3.9–10.3)

## 2009-09-27 ENCOUNTER — Encounter: Payer: Self-pay | Admitting: Internal Medicine

## 2009-09-27 ENCOUNTER — Telehealth: Payer: Self-pay | Admitting: Internal Medicine

## 2009-09-27 LAB — CBC WITH DIFFERENTIAL/PLATELET
BASO%: 0.7 % (ref 0.0–2.0)
Eosinophils Absolute: 0.1 10*3/uL (ref 0.0–0.5)
HCT: 36 % (ref 34.8–46.6)
LYMPH%: 36.1 % (ref 14.0–49.7)
MCHC: 33.6 g/dL (ref 31.5–36.0)
MONO#: 0.4 10*3/uL (ref 0.1–0.9)
NEUT#: 2.7 10*3/uL (ref 1.5–6.5)
NEUT%: 53.3 % (ref 38.4–76.8)
Platelets: 251 10*3/uL (ref 145–400)
RBC: 3.81 10*6/uL (ref 3.70–5.45)
WBC: 5 10*3/uL (ref 3.9–10.3)
lymph#: 1.8 10*3/uL (ref 0.9–3.3)

## 2009-09-27 LAB — COMPREHENSIVE METABOLIC PANEL
ALT: 18 U/L (ref 0–35)
Albumin: 4.4 g/dL (ref 3.5–5.2)
CO2: 29 mEq/L (ref 19–32)
Calcium: 9.7 mg/dL (ref 8.4–10.5)
Chloride: 101 mEq/L (ref 96–112)
Glucose, Bld: 89 mg/dL (ref 70–99)
Sodium: 142 mEq/L (ref 135–145)
Total Protein: 7.3 g/dL (ref 6.0–8.3)

## 2009-09-29 ENCOUNTER — Ambulatory Visit: Payer: Self-pay | Admitting: Gastroenterology

## 2009-09-29 DIAGNOSIS — Z853 Personal history of malignant neoplasm of breast: Secondary | ICD-10-CM

## 2009-09-29 DIAGNOSIS — K219 Gastro-esophageal reflux disease without esophagitis: Secondary | ICD-10-CM

## 2009-09-29 DIAGNOSIS — F411 Generalized anxiety disorder: Secondary | ICD-10-CM | POA: Insufficient documentation

## 2009-09-29 DIAGNOSIS — R197 Diarrhea, unspecified: Secondary | ICD-10-CM

## 2009-09-29 DIAGNOSIS — K5900 Constipation, unspecified: Secondary | ICD-10-CM | POA: Insufficient documentation

## 2009-09-29 HISTORY — DX: Personal history of malignant neoplasm of breast: Z85.3

## 2009-09-29 HISTORY — DX: Gastro-esophageal reflux disease without esophagitis: K21.9

## 2009-10-18 ENCOUNTER — Encounter: Payer: Self-pay | Admitting: Internal Medicine

## 2009-10-18 ENCOUNTER — Ambulatory Visit: Payer: Self-pay | Admitting: Oncology

## 2009-10-18 LAB — COMPREHENSIVE METABOLIC PANEL
ALT: 14 U/L (ref 0–35)
AST: 16 U/L (ref 0–37)
Alkaline Phosphatase: 95 U/L (ref 39–117)
CO2: 24 mEq/L (ref 19–32)
Sodium: 141 mEq/L (ref 135–145)
Total Bilirubin: 0.3 mg/dL (ref 0.3–1.2)
Total Protein: 6.7 g/dL (ref 6.0–8.3)

## 2009-10-18 LAB — CBC WITH DIFFERENTIAL/PLATELET
BASO%: 0.3 % (ref 0.0–2.0)
EOS%: 1 % (ref 0.0–7.0)
LYMPH%: 12.5 % — ABNORMAL LOW (ref 14.0–49.7)
MCH: 32.9 pg (ref 25.1–34.0)
MCHC: 33.9 g/dL (ref 31.5–36.0)
MONO#: 0.3 10*3/uL (ref 0.1–0.9)
Platelets: 240 10*3/uL (ref 145–400)
RBC: 3.67 10*6/uL — ABNORMAL LOW (ref 3.70–5.45)
WBC: 9.5 10*3/uL (ref 3.9–10.3)

## 2009-11-01 LAB — CBC WITH DIFFERENTIAL/PLATELET
BASO%: 0.2 % (ref 0.0–2.0)
Eosinophils Absolute: 0.1 10*3/uL (ref 0.0–0.5)
LYMPH%: 9 % — ABNORMAL LOW (ref 14.0–49.7)
MCHC: 34 g/dL (ref 31.5–36.0)
MONO#: 0.5 10*3/uL (ref 0.1–0.9)
NEUT#: 9.8 10*3/uL — ABNORMAL HIGH (ref 1.5–6.5)
Platelets: 265 10*3/uL (ref 145–400)
RBC: 3.66 10*6/uL — ABNORMAL LOW (ref 3.70–5.45)
RDW: 18 % — ABNORMAL HIGH (ref 11.2–14.5)
WBC: 11.4 10*3/uL — ABNORMAL HIGH (ref 3.9–10.3)
lymph#: 1 10*3/uL (ref 0.9–3.3)

## 2009-11-01 LAB — COMPREHENSIVE METABOLIC PANEL
ALT: 14 U/L (ref 0–35)
Albumin: 4.3 g/dL (ref 3.5–5.2)
CO2: 28 mEq/L (ref 19–32)
Calcium: 9.5 mg/dL (ref 8.4–10.5)
Chloride: 101 mEq/L (ref 96–112)
Glucose, Bld: 95 mg/dL (ref 70–99)
Potassium: 3.7 mEq/L (ref 3.5–5.3)
Sodium: 140 mEq/L (ref 135–145)
Total Protein: 7.2 g/dL (ref 6.0–8.3)

## 2009-11-12 DIAGNOSIS — K5520 Angiodysplasia of colon without hemorrhage: Secondary | ICD-10-CM

## 2009-11-12 HISTORY — DX: Angiodysplasia of colon without hemorrhage: K55.20

## 2009-11-15 ENCOUNTER — Encounter: Payer: Self-pay | Admitting: Internal Medicine

## 2009-11-15 LAB — CBC WITH DIFFERENTIAL/PLATELET
BASO%: 0.2 % (ref 0.0–2.0)
LYMPH%: 24.4 % (ref 14.0–49.7)
MCHC: 33.9 g/dL (ref 31.5–36.0)
MONO#: 0.4 10*3/uL (ref 0.1–0.9)
MONO%: 6.6 % (ref 0.0–14.0)
Platelets: 275 10*3/uL (ref 145–400)
RBC: 3.51 10*6/uL — ABNORMAL LOW (ref 3.70–5.45)
RDW: 18.4 % — ABNORMAL HIGH (ref 11.2–14.5)
WBC: 5.7 10*3/uL (ref 3.9–10.3)

## 2009-12-02 ENCOUNTER — Ambulatory Visit: Payer: Self-pay | Admitting: Surgery

## 2009-12-06 ENCOUNTER — Ambulatory Visit: Payer: Self-pay | Admitting: Oncology

## 2009-12-09 LAB — COMPREHENSIVE METABOLIC PANEL
ALT: 13 U/L (ref 0–35)
Albumin: 4.4 g/dL (ref 3.5–5.2)
CO2: 28 mEq/L (ref 19–32)
Calcium: 9.5 mg/dL (ref 8.4–10.5)
Chloride: 101 mEq/L (ref 96–112)
Sodium: 139 mEq/L (ref 135–145)
Total Protein: 7.1 g/dL (ref 6.0–8.3)

## 2009-12-09 LAB — CBC WITH DIFFERENTIAL/PLATELET
BASO%: 0.4 % (ref 0.0–2.0)
HCT: 36.3 % (ref 34.8–46.6)
MCHC: 34.5 g/dL (ref 31.5–36.0)
MONO#: 0.3 10*3/uL (ref 0.1–0.9)
NEUT#: 3.3 10*3/uL (ref 1.5–6.5)
RBC: 3.6 10*6/uL — ABNORMAL LOW (ref 3.70–5.45)
WBC: 5.4 10*3/uL (ref 3.9–10.3)
lymph#: 1.7 10*3/uL (ref 0.9–3.3)

## 2009-12-16 ENCOUNTER — Encounter: Admission: RE | Admit: 2009-12-16 | Discharge: 2009-12-16 | Payer: Self-pay | Admitting: Oncology

## 2009-12-23 ENCOUNTER — Telehealth: Payer: Self-pay | Admitting: Physician Assistant

## 2009-12-26 ENCOUNTER — Telehealth: Payer: Self-pay | Admitting: Internal Medicine

## 2010-01-04 ENCOUNTER — Encounter: Payer: Self-pay | Admitting: Family Medicine

## 2010-01-04 LAB — CBC WITH DIFFERENTIAL/PLATELET
Basophils Absolute: 0 10*3/uL (ref 0.0–0.1)
Eosinophils Absolute: 0.1 10*3/uL (ref 0.0–0.5)
LYMPH%: 16 % (ref 14.0–49.7)
MCH: 33.3 pg (ref 25.1–34.0)
MCV: 102.2 fL — ABNORMAL HIGH (ref 79.5–101.0)
MONO%: 6.7 % (ref 0.0–14.0)
NEUT#: 7.1 10*3/uL — ABNORMAL HIGH (ref 1.5–6.5)
Platelets: 238 10*3/uL (ref 145–400)
RBC: 3.63 10*6/uL — ABNORMAL LOW (ref 3.70–5.45)

## 2010-01-13 ENCOUNTER — Encounter: Payer: Self-pay | Admitting: Internal Medicine

## 2010-01-13 DIAGNOSIS — I6529 Occlusion and stenosis of unspecified carotid artery: Secondary | ICD-10-CM

## 2010-01-17 ENCOUNTER — Ambulatory Visit: Payer: Self-pay

## 2010-01-17 ENCOUNTER — Encounter: Payer: Self-pay | Admitting: Internal Medicine

## 2010-01-30 ENCOUNTER — Ambulatory Visit: Payer: Self-pay | Admitting: Oncology

## 2010-02-01 LAB — CBC WITH DIFFERENTIAL/PLATELET
BASO%: 0.7 % (ref 0.0–2.0)
EOS%: 2.2 % (ref 0.0–7.0)
Eosinophils Absolute: 0.1 10*3/uL (ref 0.0–0.5)
LYMPH%: 23.4 % (ref 14.0–49.7)
MCH: 33.9 pg (ref 25.1–34.0)
MCHC: 34 g/dL (ref 31.5–36.0)
MCV: 99.8 fL (ref 79.5–101.0)
MONO%: 6.5 % (ref 0.0–14.0)
Platelets: 234 10*3/uL (ref 145–400)
RBC: 3.59 10*6/uL — ABNORMAL LOW (ref 3.70–5.45)
RDW: 14.7 % — ABNORMAL HIGH (ref 11.2–14.5)

## 2010-02-01 LAB — COMPREHENSIVE METABOLIC PANEL
AST: 16 U/L (ref 0–37)
Alkaline Phosphatase: 89 U/L (ref 39–117)
Glucose, Bld: 81 mg/dL (ref 70–99)
Sodium: 138 mEq/L (ref 135–145)
Total Bilirubin: 0.3 mg/dL (ref 0.3–1.2)
Total Protein: 6.5 g/dL (ref 6.0–8.3)

## 2010-02-07 ENCOUNTER — Ambulatory Visit: Payer: Self-pay | Admitting: Internal Medicine

## 2010-02-10 ENCOUNTER — Ambulatory Visit: Payer: Self-pay | Admitting: Internal Medicine

## 2010-02-10 DIAGNOSIS — E785 Hyperlipidemia, unspecified: Secondary | ICD-10-CM | POA: Insufficient documentation

## 2010-03-10 ENCOUNTER — Ambulatory Visit: Payer: Self-pay | Admitting: Oncology

## 2010-03-13 LAB — CBC WITH DIFFERENTIAL/PLATELET
EOS%: 2 % (ref 0.0–7.0)
Eosinophils Absolute: 0.1 10*3/uL (ref 0.0–0.5)
LYMPH%: 31.8 % (ref 14.0–49.7)
MCH: 33.3 pg (ref 25.1–34.0)
MCV: 97.7 fL (ref 79.5–101.0)
MONO%: 5.5 % (ref 0.0–14.0)
NEUT#: 3.8 10*3/uL (ref 1.5–6.5)
Platelets: 246 10*3/uL (ref 145–400)
RBC: 3.54 10*6/uL — ABNORMAL LOW (ref 3.70–5.45)
RDW: 15.1 % — ABNORMAL HIGH (ref 11.2–14.5)

## 2010-03-13 LAB — COMPREHENSIVE METABOLIC PANEL
AST: 21 U/L (ref 0–37)
Alkaline Phosphatase: 89 U/L (ref 39–117)
BUN: 17 mg/dL (ref 6–23)
Glucose, Bld: 71 mg/dL (ref 70–99)
Potassium: 4 mEq/L (ref 3.5–5.3)
Sodium: 140 mEq/L (ref 135–145)
Total Bilirubin: 0.3 mg/dL (ref 0.3–1.2)
Total Protein: 7 g/dL (ref 6.0–8.3)

## 2010-03-15 ENCOUNTER — Telehealth: Payer: Self-pay | Admitting: Internal Medicine

## 2010-04-03 ENCOUNTER — Ambulatory Visit (HOSPITAL_COMMUNITY): Admission: RE | Admit: 2010-04-03 | Discharge: 2010-04-03 | Payer: Self-pay | Admitting: Oncology

## 2010-04-07 ENCOUNTER — Encounter: Payer: Self-pay | Admitting: Internal Medicine

## 2010-04-07 ENCOUNTER — Encounter: Payer: Self-pay | Admitting: Family Medicine

## 2010-04-07 LAB — CBC WITH DIFFERENTIAL/PLATELET
Basophils Absolute: 0 10*3/uL (ref 0.0–0.1)
EOS%: 1.4 % (ref 0.0–7.0)
Eosinophils Absolute: 0.1 10*3/uL (ref 0.0–0.5)
HGB: 11.4 g/dL — ABNORMAL LOW (ref 11.6–15.9)
LYMPH%: 18.4 % (ref 14.0–49.7)
MCH: 33.5 pg (ref 25.1–34.0)
MCV: 97.8 fL (ref 79.5–101.0)
MONO%: 5.4 % (ref 0.0–14.0)
NEUT#: 4.5 10*3/uL (ref 1.5–6.5)
Platelets: 199 10*3/uL (ref 145–400)

## 2010-04-17 ENCOUNTER — Telehealth: Payer: Self-pay | Admitting: Internal Medicine

## 2010-04-18 ENCOUNTER — Telehealth: Payer: Self-pay | Admitting: Internal Medicine

## 2010-04-25 ENCOUNTER — Telehealth: Payer: Self-pay | Admitting: Internal Medicine

## 2010-04-26 ENCOUNTER — Ambulatory Visit: Payer: Self-pay | Admitting: Oncology

## 2010-04-28 LAB — CBC WITH DIFFERENTIAL/PLATELET
Basophils Absolute: 0 10*3/uL (ref 0.0–0.1)
EOS%: 2.3 % (ref 0.0–7.0)
Eosinophils Absolute: 0.1 10*3/uL (ref 0.0–0.5)
HCT: 34.2 % — ABNORMAL LOW (ref 34.8–46.6)
HGB: 11.7 g/dL (ref 11.6–15.9)
LYMPH%: 29.9 % (ref 14.0–49.7)
MCH: 34.1 pg — ABNORMAL HIGH (ref 25.1–34.0)
MCV: 99.7 fL (ref 79.5–101.0)
MONO%: 6.5 % (ref 0.0–14.0)
NEUT#: 3.2 10*3/uL (ref 1.5–6.5)
NEUT%: 60.6 % (ref 38.4–76.8)
Platelets: 273 10*3/uL (ref 145–400)

## 2010-05-04 ENCOUNTER — Ambulatory Visit: Payer: Self-pay | Admitting: Internal Medicine

## 2010-05-05 ENCOUNTER — Telehealth: Payer: Self-pay | Admitting: Internal Medicine

## 2010-05-10 ENCOUNTER — Encounter: Payer: Self-pay | Admitting: Internal Medicine

## 2010-05-24 ENCOUNTER — Encounter: Payer: Self-pay | Admitting: Family Medicine

## 2010-05-24 LAB — CBC WITH DIFFERENTIAL/PLATELET
BASO%: 0.5 % (ref 0.0–2.0)
EOS%: 2.2 % (ref 0.0–7.0)
LYMPH%: 27.2 % (ref 14.0–49.7)
MCH: 34.2 pg — ABNORMAL HIGH (ref 25.1–34.0)
MCHC: 34.5 g/dL (ref 31.5–36.0)
MONO#: 0.4 10*3/uL (ref 0.1–0.9)
Platelets: 231 10*3/uL (ref 145–400)
RBC: 3.47 10*6/uL — ABNORMAL LOW (ref 3.70–5.45)
WBC: 5.2 10*3/uL (ref 3.9–10.3)
lymph#: 1.4 10*3/uL (ref 0.9–3.3)

## 2010-05-24 LAB — COMPREHENSIVE METABOLIC PANEL
ALT: 20 U/L (ref 0–35)
AST: 26 U/L (ref 0–37)
Alkaline Phosphatase: 91 U/L (ref 39–117)
CO2: 26 mEq/L (ref 19–32)
Creatinine, Ser: 0.76 mg/dL (ref 0.40–1.20)
Sodium: 140 mEq/L (ref 135–145)
Total Bilirubin: 0.4 mg/dL (ref 0.3–1.2)
Total Protein: 7.1 g/dL (ref 6.0–8.3)

## 2010-05-24 LAB — CANCER ANTIGEN 27.29: CA 27.29: 21 U/mL (ref 0–39)

## 2010-06-14 ENCOUNTER — Telehealth: Payer: Self-pay | Admitting: Internal Medicine

## 2010-06-16 ENCOUNTER — Ambulatory Visit: Payer: Self-pay | Admitting: Oncology

## 2010-06-20 ENCOUNTER — Encounter: Payer: Self-pay | Admitting: Family Medicine

## 2010-07-13 ENCOUNTER — Encounter (INDEPENDENT_AMBULATORY_CARE_PROVIDER_SITE_OTHER): Payer: Self-pay | Admitting: *Deleted

## 2010-07-13 ENCOUNTER — Ambulatory Visit: Payer: Self-pay | Admitting: Internal Medicine

## 2010-07-13 DIAGNOSIS — R198 Other specified symptoms and signs involving the digestive system and abdomen: Secondary | ICD-10-CM

## 2010-07-21 ENCOUNTER — Ambulatory Visit: Payer: Self-pay | Admitting: Oncology

## 2010-07-26 ENCOUNTER — Encounter: Payer: Self-pay | Admitting: Family Medicine

## 2010-07-26 LAB — CBC WITH DIFFERENTIAL/PLATELET
BASO%: 0.5 % (ref 0.0–2.0)
LYMPH%: 27.8 % (ref 14.0–49.7)
MCHC: 33.1 g/dL (ref 31.5–36.0)
MCV: 98.4 fL (ref 79.5–101.0)
MONO%: 6 % (ref 0.0–14.0)
NEUT%: 64.1 % (ref 38.4–76.8)
Platelets: 251 10*3/uL (ref 145–400)
RBC: 3.63 10*6/uL — ABNORMAL LOW (ref 3.70–5.45)

## 2010-08-11 ENCOUNTER — Ambulatory Visit: Payer: Self-pay | Admitting: Internal Medicine

## 2010-08-22 ENCOUNTER — Telehealth: Payer: Self-pay | Admitting: Internal Medicine

## 2010-08-24 LAB — CONVERTED CEMR LAB
AST: 27 units/L (ref 0–37)
Albumin: 3.9 g/dL (ref 3.5–5.2)
Cholesterol: 208 mg/dL — ABNORMAL HIGH (ref 0–200)
Direct LDL: 121.2 mg/dL
Total CHOL/HDL Ratio: 3
Triglycerides: 114 mg/dL (ref 0.0–149.0)
VLDL: 22.8 mg/dL (ref 0.0–40.0)

## 2010-09-04 ENCOUNTER — Telehealth (INDEPENDENT_AMBULATORY_CARE_PROVIDER_SITE_OTHER): Payer: Self-pay | Admitting: *Deleted

## 2010-09-05 ENCOUNTER — Ambulatory Visit: Payer: Self-pay | Admitting: Internal Medicine

## 2010-09-05 HISTORY — PX: COLONOSCOPY: SHX5424

## 2010-09-05 LAB — HM COLONOSCOPY

## 2010-09-15 ENCOUNTER — Ambulatory Visit: Payer: Self-pay | Admitting: Oncology

## 2010-09-19 ENCOUNTER — Encounter: Payer: Self-pay | Admitting: Family Medicine

## 2010-09-19 LAB — CBC WITH DIFFERENTIAL/PLATELET
BASO%: 0.4 % (ref 0.0–2.0)
LYMPH%: 27.8 % (ref 14.0–49.7)
MCHC: 33.6 g/dL (ref 31.5–36.0)
MCV: 96.8 fL (ref 79.5–101.0)
MONO%: 6.6 % (ref 0.0–14.0)
Platelets: 244 10*3/uL (ref 145–400)
RBC: 3.69 10*6/uL — ABNORMAL LOW (ref 3.70–5.45)
WBC: 5 10*3/uL (ref 3.9–10.3)

## 2010-09-20 LAB — COMPREHENSIVE METABOLIC PANEL WITH GFR
ALT: 22 U/L (ref 0–35)
AST: 23 U/L (ref 0–37)
Albumin: 4.6 g/dL (ref 3.5–5.2)
Alkaline Phosphatase: 113 U/L (ref 39–117)
BUN: 16 mg/dL (ref 6–23)
CO2: 28 meq/L (ref 19–32)
Calcium: 9.4 mg/dL (ref 8.4–10.5)
Chloride: 103 meq/L (ref 96–112)
Creatinine, Ser: 0.72 mg/dL (ref 0.40–1.20)
Glucose, Bld: 95 mg/dL (ref 70–99)
Potassium: 3.8 meq/L (ref 3.5–5.3)
Sodium: 142 meq/L (ref 135–145)
Total Bilirubin: 0.3 mg/dL (ref 0.3–1.2)
Total Protein: 6.7 g/dL (ref 6.0–8.3)

## 2010-10-17 ENCOUNTER — Ambulatory Visit: Payer: Self-pay | Admitting: Internal Medicine

## 2010-10-17 DIAGNOSIS — E782 Mixed hyperlipidemia: Secondary | ICD-10-CM

## 2010-10-17 HISTORY — DX: Mixed hyperlipidemia: E78.2

## 2010-10-17 LAB — CONVERTED CEMR LAB: Cholesterol: 205 mg/dL — ABNORMAL HIGH (ref 0–200)

## 2010-10-24 ENCOUNTER — Telehealth: Payer: Self-pay | Admitting: Internal Medicine

## 2010-11-24 ENCOUNTER — Ambulatory Visit: Payer: Self-pay | Admitting: Oncology

## 2010-11-28 LAB — COMPREHENSIVE METABOLIC PANEL
ALT: 17 U/L (ref 0–35)
AST: 17 U/L (ref 0–37)
Albumin: 4.2 g/dL (ref 3.5–5.2)
Alkaline Phosphatase: 110 U/L (ref 39–117)
BUN: 18 mg/dL (ref 6–23)
CO2: 30 mEq/L (ref 19–32)
Calcium: 9.5 mg/dL (ref 8.4–10.5)
Chloride: 103 mEq/L (ref 96–112)
Creatinine, Ser: 0.7 mg/dL (ref 0.40–1.20)
Glucose, Bld: 78 mg/dL (ref 70–99)
Potassium: 4.1 mEq/L (ref 3.5–5.3)
Sodium: 141 mEq/L (ref 135–145)
Total Bilirubin: 0.4 mg/dL (ref 0.3–1.2)
Total Protein: 6.8 g/dL (ref 6.0–8.3)

## 2010-11-28 LAB — CBC WITH DIFFERENTIAL/PLATELET
BASO%: 0.8 % (ref 0.0–2.0)
Basophils Absolute: 0 10*3/uL (ref 0.0–0.1)
EOS%: 1.6 % (ref 0.0–7.0)
Eosinophils Absolute: 0.1 10*3/uL (ref 0.0–0.5)
HCT: 35.4 % (ref 34.8–46.6)
HGB: 11.9 g/dL (ref 11.6–15.9)
LYMPH%: 25.1 % (ref 14.0–49.7)
MCH: 32.3 pg (ref 25.1–34.0)
MCHC: 33.8 g/dL (ref 31.5–36.0)
MCV: 95.5 fL (ref 79.5–101.0)
MONO#: 0.4 10*3/uL (ref 0.1–0.9)
MONO%: 6.9 % (ref 0.0–14.0)
NEUT#: 3.5 10*3/uL (ref 1.5–6.5)
NEUT%: 65.6 % (ref 38.4–76.8)
Platelets: 223 10*3/uL (ref 145–400)
RBC: 3.7 10*6/uL (ref 3.70–5.45)
RDW: 13.1 % (ref 11.2–14.5)
WBC: 5.4 10*3/uL (ref 3.9–10.3)
lymph#: 1.3 10*3/uL (ref 0.9–3.3)

## 2010-12-04 ENCOUNTER — Encounter: Payer: Self-pay | Admitting: Internal Medicine

## 2010-12-12 NOTE — Progress Notes (Signed)
Summary: pt calling to give report  Phone Note Call from Patient Call back at Home Phone [phone]   Caller: Patient Reason for Call: Talk to Nurse, Talk to Doctor Summary of Call: pt was taken off crestor for 1wk and was told to call and give a report  Initial call taken by: Omer Jack,  April 25, 2010 1:09 PM  Follow-up for Phone Call        Phone Call Completed PT INSTRUCTED TO CONT TO HOLD  CRESTOR FOR ANOTHER WEEK AND TO CALL WITH UPDATE  INSTRUCTED  WILL FORWARD TO DR Parissa Chiao  FOR REVIEW IF GIVEN DIFFER INSTRUCTIONS WILL CALL PT BACK VERBALZIED UNDERSTANDING. Follow-up by: Scherrie Bateman, LPN,  April 25, 2010 1:30 PM  Additional Follow-up for Phone Call Additional follow up Details #1::        Patient came off crestor because she was convinced it was leading to knee swelling.  When she calls again need to ask how legs/knee is. Additional Follow-up by: Sherrill Raring, MD, [facility],  April 25, 2010 2:28 PM     Appended Document: pt calling to give report CONT TO C/O KNEE SWELLING AND SLIGHT LEG PAIN INSTRUCTED TO GIVE ANOTHER WEEK AND TO CALL WITH UPDATE VERBALIZED UNDERSTANDING.

## 2010-12-12 NOTE — Progress Notes (Signed)
Summary: Medication  Phone Note Call from Patient   Caller: Spouse Call For: Dr. Leone Payor Reason for Call: Talk to Nurse Summary of Call: Pt is calling about the Omeprazole medication. She is on chemo one wk and then off one wk. Was suggested she take the omneprozole twice a day on the days she has chemo so she is using more medication than what is prescribed. Initial call taken by: Karna Christmas,  December 26, 2009 2:23 PM  Follow-up for Phone Call        spoke with pt's husband--I will correct 90 day rx sent to [facility].  Offered pt samples until mail order arrives.[name] pt's husband declines.  Advised I will change to two times a day dosing.  I spoke to [place], Kathie Dike and [place], Teacher, early years/pre.  Corrected quantity to 180 tabs (90 day supply) and changed prescriber  to Dr. Leone Payor.   Follow-up by: Francee Piccolo CMA Duncan Dull),  December 28, 2009 11:27 AM    Prescriptions: OMEPRAZOLE 20 MG  CPDR (OMEPRAZOLE) take one by mouth two times a day  #180 x 2   Entered by:   Francee Piccolo CMA (AAMA)   Authorized by:   [name] MD, [facility]   Signed by:   Francee Piccolo CMA (AAMA) on 12/28/2009   Method used:   Reprint   RxID:   [id number]

## 2010-12-12 NOTE — Letter (Signed)
[facility]   Imported By: Sherian Rein 12/16/2009 07:47:05  _____________________________________________________________________  External Attachment:    Type:   Image     Comment:   External Document

## 2010-12-12 NOTE — Progress Notes (Signed)
Summary: test results  Phone Note Call from Patient Call back at Home Phone [phone]   Caller: Patient Reason for Call: Talk to Nurse, Lab or Test Results Summary of Call: pt will like to speak to a nurse re her test results.  Initial call taken by: Roe Coombs,  August 22, 2010 9:56 AM  Follow-up for Phone Call        Spoke with pt. Lab results and Md's recommendations given. Pt is taken Lipitor 10 mg tablet. She  takes one tablet by mouth daily. A prescription was send to [facility] for Lipitor 20 mg take one tablet by mouth daily. An appointment was made for pt. for labs, Lipid profile and AST in 8 weeks 10/17/10. Pt. aware. Follow-up by: Ollen Gross, RN, BSN,  August 22, 2010 10:27 AM    New/Updated Medications: LIPITOR 20 MG  TABS (ATORVASTATIN CALCIUM) take One tablet by mouth daily Prescriptions: LIPITOR 20 MG  TABS (ATORVASTATIN CALCIUM) take One tablet by mouth daily  #30 x 6   Entered by:   Ollen Gross, RN, BSN   Authorized by:   Sherrill Raring, MD, [facility]   Signed by:   Ollen Gross, RN, BSN on 08/22/2010   Method used:   Electronically to        [facility] [phone]* (retail)       [address] [place], [place]  [address]       Ph: [phone]       Fax: [phone]   RxID:   [id number]

## 2010-12-12 NOTE — Miscellaneous (Signed)
Summary: Orders Update  Clinical Lists Changes  Problems: Added new problem of CAROTID ARTERY STENOSIS (ICD-433.10) Orders: Added new Test order of Carotid Duplex (Carotid Duplex) - Signed 

## 2010-12-12 NOTE — Letter (Signed)
[facility]   Imported By: Maryln Gottron 02/02/2010 15:04:58  _____________________________________________________________________  External Attachment:    Type:   Image     Comment:   External Document

## 2010-12-12 NOTE — Letter (Signed)
Summary: [facility] Instructions  [facility]  [address] [place], [place] [address]   Phone: [phone]  Fax: [phone]       JOSIE BURLEIGH    1941-09-02    MRN: [id number]      Procedure Day Dorna Bloom: Jake Shark, 09/05/10     Arrival Time: 7:30 AM      Procedure Time: 8:30 AM    Location of Procedure:                    _X_  [facility] (4th Floor)                    PREPARATION FOR COLONOSCOPY WITH MOVIPREP   Starting 5 days prior to your procedure 08/31/10 do not eat nuts, seeds, popcorn, corn, beans, peas,  salads, or any raw vegetables.  Do not take any fiber supplements (e.g. Metamucil, Citrucel, and Benefiber).  THE DAY BEFORE YOUR PROCEDURE         MONDAY, 09/04/10  1.  Drink clear liquids the entire day-NO SOLID FOOD  2.  Do not drink anything colored red or purple.  Avoid juices with pulp.  No orange juice.  3.  Drink at least 64 oz. (8 glasses) of fluid/clear liquids during the day to prevent dehydration and help the prep work efficiently.  CLEAR LIQUIDS INCLUDE: Water Jello Ice Popsicles Tea (sugar ok, no milk/cream) Powdered fruit flavored drinks Coffee (sugar ok, no milk/cream) Gatorade Juice: apple, white grape, white cranberry  Lemonade Clear bullion, consomm, broth Carbonated beverages (any kind) Strained chicken noodle soup Hard Candy                             4.  In the morning, mix first dose of MoviPrep solution:    Empty 1 Pouch A and 1 Pouch B into the disposable container    Add lukewarm drinking water to the top line of the container. Mix to dissolve    Refrigerate (mixed solution should be used within 24 hrs)  5.  Begin drinking the prep at 5:00 p.m. The MoviPrep container is divided by 4 marks.   Every 15 minutes drink the solution down to the next mark (approximately 8 oz) until the full liter is complete.   6.  Follow completed prep with 16 oz of clear liquid of your choice (Nothing red or purple).   Continue to drink clear liquids until bedtime.  7.  Mix second dose of MoviPrep solution:    Empty 1 Pouch A and 1 Pouch B into the disposable container    Add lukewarm drinking water to the top line of the container. Mix to dissolve    Refrigerate  Beginning at 9:00 p.m.         1. Every 15 minutes, drink the solution down to the next mark (approx 8 oz) until the full liter is complete.         2. Follow completed prep with 16 oz. of clear liquid of your choice.  THE DAY OF YOUR PROCEDURE      TUESDAY, 09/05/10    1. You may drink clear liquids until 6:30 AM (2 HOURS BEFORE PROCEDURE).  MEDICATION INSTRUCTIONS  Unless otherwise instructed, you should take regular prescription medications with a small sip of water   as early as possible the morning of your procedure.       OTHER INSTRUCTIONS  You will need  a responsible adult at least 69 years of age to accompany you and drive you home.   This person must remain in the waiting room during your procedure.  Wear loose fitting clothing that is easily removed.  Leave jewelry and other valuables at home.  However, you may wish to bring a book to read or  an iPod/MP3 player to listen to music as you wait for your procedure to start.  Remove all body piercing jewelry and leave at home.  Total time from sign-in until discharge is approximately 2-3 hours.  You should go home directly after your procedure and rest.  You can resume normal activities the  day after your procedure.  The day of your procedure you should not:   Drive   Make legal decisions   Operate machinery   Drink alcohol   Return to work  You will receive specific instructions about eating, activities and medications before you leave.   The above instructions have been reviewed and explained to me by   _______________________   I fully understand and can verbalize these instructions _____________________________ Date _________

## 2010-12-12 NOTE — Letter (Signed)
[facility]   Imported By: Maryln Gottron 07/06/2010 12:45:55  _____________________________________________________________________  External Attachment:    Type:   Image     Comment:   External Document

## 2010-12-12 NOTE — Progress Notes (Signed)
[facility]   Imported By: Debby Freiberg 05/06/2010 10:03:36  _____________________________________________________________________  External Attachment:    Type:   Image     Comment:   External Document

## 2010-12-12 NOTE — Letter (Signed)
Summary: [facility]  [facility]   Imported By: Maryln Gottron 08/10/2010 14:20:48  _____________________________________________________________________  External Attachment:    Type:   Image     Comment:   External Document

## 2010-12-12 NOTE — Miscellaneous (Signed)
Summary: Orders Update  Clinical Lists Changes  Orders: Added new Test order of TLB-Lipid Panel (80061-LIPID) - Signed Added new Test order of TLB-AST (SGOT) (84450-SGOT) - Signed 

## 2010-12-12 NOTE — Progress Notes (Signed)
Summary: New script for omeprazole  Phone Note Call from Patient Call back at Home Phone [phone]   Caller: Spouse-Sam Call For: Mike Gip, PA Summary of Call: Is on chemo one wk off one wk. Was suggested she take her medicine omneprozole twice a day on the days she has chemo. Now she is getting short on her medicines. Can we please fax a new script to [facility] [employer]  Initial call taken by: Leanor Kail [facility],  December 23, 2009 3:42 PM  Follow-up for Phone Call        rx sent, left message on patients machine to advise patient. patient needs office visit on may 2011.  Follow-up by: Harlow Mares CMA Duncan Dull),  December 23, 2009 4:32 PM    New/Updated Medications: OMEPRAZOLE 20 MG  CPDR (OMEPRAZOLE) take one by mouth two times a day Prescriptions: OMEPRAZOLE 20 MG  CPDR (OMEPRAZOLE) take one by mouth two times a day  #90 x 2   Entered by:   Harlow Mares CMA (AAMA)   Authorized by:   Sammuel Cooper PA-c   Signed by:   Harlow Mares CMA (AAMA) on 12/23/2009   Method used:   Faxed to ...       [facility] (mail-order)       [address]       [place], [place]  [id number]       Ph: [phone]       Fax: [phone]   RxID:   [id number]

## 2010-12-12 NOTE — Progress Notes (Signed)
Summary: Triage  Phone Note Call from Patient Call back at Home Phone [phone]   Caller: Patient Call For: Dr. Leone Payor Reason for Call: Talk to Nurse Summary of Call: Pt. has popcorn Friday and Saturday evening....would like to discuss Initial call taken by: Karna Christmas,  September 04, 2010 9:10 AM  Follow-up for Phone Call        Returned phone call to patient. She was concerned after reading prep instructions again that there would be a problem as she ate popcorn Friday and Saturday evenings. Advised to follow prep instructions given and follow clear liquid diet today. Pt denies any further questions. Follow-up by: Haynes Bast, RN

## 2010-12-12 NOTE — Letter (Signed)
Summary: [facility]  [facility]   Imported By: Maryln Gottron 09/29/2010 14:31:12  _____________________________________________________________________  External Attachment:    Type:   Image     Comment:   External Document

## 2010-12-12 NOTE — Letter (Signed)
[facility]   Imported By: Lester [place] 11/24/2009 12:27:34  _____________________________________________________________________  External Attachment:    Type:   Image     Comment:   External Document

## 2010-12-12 NOTE — Letter (Signed)
[facility]   Imported By: Maryln Gottron 06/08/2010 14:21:15  _____________________________________________________________________  External Attachment:    Type:   Image     Comment:   External Document

## 2010-12-12 NOTE — Progress Notes (Signed)
Summary: Preload-Problems-Medications-Allergies       Additional Follow-up for Phone Call Additional follow up Details #2::    Pharmacy wants to know can pt switch to simvastaion  (per insurance) instead of lipitor. Follow-up by: Burnett Kanaris, CNA,  June 14, 2010 3:40 PM  Additional Follow-up for Phone Call Additional follow up Details #3:: Details for Additional Follow-up Action Taken: She could try Zocor 20.  Had problems with Crestor (metabolized differently)  F/U lipid and AST and CK in 8 wks. Additional Follow-up by: Sherrill Raring, MD, [facility],  June 20, 2010 10:17 AM   Appended Document: Preload-Problems-Medications-Allergies Called patient and advised will mail Lipitor 4 dollar coupon for 1 month supply each refill untill it goes generic so she can stay on the same medication.

## 2010-12-12 NOTE — Assessment & Plan Note (Signed)
Summary: per phone call/ jr   Referring Provider:  Lupita Dawn, MD Primary Provider:  Herb Grays, MD  CC:  ROV; Lab results.  History of Present Illness: patient is a 70 year old with a hsitory of CV dz and dyslipidemia.  I last saw her in November 2009. Since seen she denies chest pain.  Breathing is good.  She has backed down on activity some because of breast CA and chemotherapy.   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 At Bedtime 2)  Amitriptyline Hcl 150 Mg Tabs (Amitriptyline Hcl) .... Take 1 Tab By Mouth At Bedtime 3)  Omeprazole 20 Mg  Cpdr (Omeprazole) .... Take One By Mouth Two Times A Day 4)  Vitamin B-6 50 Mg Tabs (Pyridoxine Hcl) .... Three Times A Day 5)  Vitamin B-12 1000 Mcg Tabs (Cyanocobalamin) .... Once Daily 6)  Vitamin D3 1000 Unit Tabs (Cholecalciferol) .... Take 2 Tablets Daily 7)  Allegra 60 Mg Tabs (Fexofenadine Hcl) .... As Needed 8)  Align  Caps (Probiotic Product) .... Take 1 Capsule Daily X 1 Month 9)  Xeloda 500 Mg Tabs (Capecitabine) .... As Directed  Allergies: 1)  ! Benadryl 2)  ! Codeine 3)  ! Morphine  Past History:  Past medical, surgical, family and social histories (including risk factors) reviewed, and no changes noted (except as noted below).  Past Medical History: Reviewed history from 05/20/2009 and no changes required. Breast Cancer 2001, 2008 Anemia Anxiety Disorder COPD Hyperlipidemia Pneumonia/Pleurisy 12/2008 Arthritis Breast Cancer XRT/chemo 2001-2002 Depression  Past Surgical History: Reviewed history from 05/20/2009 and no changes  required. Breast-Lumpectomy 2001 Breast-Mastectomy-modified radical 2008 Reconstruction surgery,2008,2009, 2010 ERCP, CBD stone extraction 2010 Cholecystectomy 2010 EGD/dili 2010  Family History: Reviewed history from 05/20/2009 and no changes required. Family History of Breast Cancer: Family History of Ovarian Cancer: Family History of Pancreatic Cancer: Family History of Heart Disease: Father Primary Biliary Cirrhosis-sister died age 23  Social History: Reviewed history from 03/15/2009 and no changes required. Occupation: Retired Patient is a former smoker.  Alcohol Use - yes occ Daily Caffeine Use -2 Illicit Drug Use - no  Review of Systems       ALl systems reviewed.  Neg to above problem.  Vital Signs:  Patient profile:   70 year old female Height:      66 inches Weight:      128 pounds Pulse rate:   82 / minute Pulse rhythm:   regular BP sitting:   120 / 76  (left arm) Cuff size:   regular  Vitals Entered By: Stanton Kidney, EMT-P (February 10, 2010 3:44 PM)  Physical Exam  Additional Exam:  Patient is in NAD HEENT:  Normocephalic, atraumatic. EOMI, PERRLA.  Neck: JVP is normal. No thyromegaly. No bruits.  Lungs: clear to auscultation. No rales no wheezes.  Heart: Regular rate and rhythm. Normal S1, S2. No S3.   No significant murmurs. PMI not displaced.  Abdomen:  Supple, nontender. Normal bowel sounds. No masses. No hepatomegaly.  Extremities:   Good distal pulses throughout. No lower extremity edema.  Musculoskeletal :moving all extremities.  Neuro:   alert and  oriented x3.    EKG  Procedure date:  02/10/2010  Findings:      NSR.  82 bpm.  Impression & Recommendations:  Problem # 1:  CAROTID ARTERY STENOSIS (ICD-433.10) MIld by recent USN.  F/U in 1 year.  Will need control of lipids.  Problem # 2:  HYPERLIPIDEMIA-MIXED (ICD-272.4) SIgnificant .  LDL was 192 with smaller particles.  I would begin Crestor.  I have reviewed wht pharmacy.  I do not  think it sould interfer with chemo Rx.  Check lipomed in 10 wks. with AST. Her updated medication list for this problem includes:    Crestor 10 Mg Tabs (Rosuvastatin calcium) .[name]... 1 tab every day at bedtime  Other Orders: EKG w/ Interpretation (93000)  Patient Instructions: 1)  Your physician recommends that you return for a FASTING lipid profile: lipomed and ast in 10 weeks [address] lab. Prescriptions: CRESTOR 10 MG TABS (ROSUVASTATIN CALCIUM) 1 tab every day at bedtime  #30 x 6   Entered by:   Layne Benton, RN, BSN   Authorized by:   Sherrill Raring, MD, [facility]   Signed by:   Layne Benton, RN, BSN on 02/10/2010   Method used:   Electronically to        [facility] [phone]* (retail)       [address] [place], [place]  [address]       Ph: [phone]       Fax: [phone]   RxID:   [id number]

## 2010-12-12 NOTE — Procedures (Signed)
Summary: Colonoscopy  Patient: [name] Note: All result statuses are Final unless otherwise noted.  Tests: (1) Colonoscopy (COL)   COL Colonoscopy           DONE     [facility]     [address]     [place], [place]  [address]           COLONOSCOPY PROCEDURE REPORT           PATIENT:  Anne [name], Anne [name]  MR#:  [id number]     BIRTHDATE:  Feb 14, 1941, 68 yrs. old  GENDER:  female     ENDOSCOPIST:  [name], MD, [facility]           PROCEDURE DATE:  09/05/2010     PROCEDURE:  Colonoscopy [address]     ASA CLASS:  Class II     INDICATIONS:  change in bowel habits increased constipation since     starting Arimidex     MEDICATIONS:   Fentanyl 100 mcg IV, Versed 9 mg IV           DESCRIPTION OF PROCEDURE:   After the risks benefits and     alternatives of the procedure were thoroughly explained, informed     consent was obtained.  Digital rectal exam was performed and     revealed no abnormalities and decreased sphincter tone.  Mildly     decreased tone, no masses. The LB CF-[id number] endoscope was     introduced through the anus and advanced to the cecum, which was     identified by both the appendix and ileocecal valve, limited by a     redundant colon.    The quality of the prep was good, using     MoviPrep.  The instrument was then slowly withdrawn as the colon     was fully examined. Insertion: 19:17 minutes Withdrawal: 12:03     minutes     <<PROCEDUREIMAGES>>           FINDINGS:  Arteriovenous malformations were seen in the in the     cecum. 3, non-bleeding, not >5 mm.  This was otherwise a normal     examination of the colon. The colon was very redundant.     Retroflexed views in the rectum revealed no abnormalities.    The     scope was then withdrawn from the patient and the procedure     completed.           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     Watch for blood loss anemia or other bleeding if placed on     anti-platelet or anti-coagulant agents in the future.           REPEAT EXAM:  In 10 year(s) for routine screening colonoscopy.     Consider deep sedation then.           [name], MD, [facility]           CC:  Jama Flavors, MD     Herb Grays, MD     The Patient           n.     Rosalie Doctor:   [name] at 09/05/2010 09:20 AM  [place], [place], [id number]  Note: An exclamation mark (!) indicates a result that was not dispersed into the flowsheet. Document Creation Date: 09/05/2010 9:20 AM _______________________________________________________________________  (1) Order result status: Final Collection or observation date-time: 09/05/2010 09:07 Requested date-time:  Receipt date-time:  Reported date-time:  Referring Physician:   Ordering Physician: Stan Head [phone]) Specimen Source:  Source: Launa Grill Order Number: [id number] Lab site:   Appended Document: Colonoscopy    Clinical Lists Changes  Observations: Added new observation of COLONNXTDUE: 08/2020 (09/05/2010 14:48)

## 2010-12-12 NOTE — Miscellaneous (Signed)
  Clinical Lists Changes  Medications: Added new medication of LIPITOR 10 MG TABS (ATORVASTATIN CALCIUM) 1 tab every evening - Signed Rx of LIPITOR 10 MG TABS (ATORVASTATIN CALCIUM) 1 tab every evening;  #30 x 3;  Signed;  Entered by: Layne Benton, RN, BSN;  Authorized by: Sherrill Raring, MD, [facility];  Method used: Electronically to [facility] [phone]*, [address] [place], [place]  [address], Ph: [phone], Fax: [phone]    Prescriptions: LIPITOR 10 MG TABS (ATORVASTATIN CALCIUM) 1 tab every evening  #30 x 3   Entered by:   Layne Benton, RN, BSN   Authorized by:   Sherrill Raring, MD, [facility]   Signed by:   Layne Benton, RN, BSN on 05/10/2010   Method used:   Electronically to        [facility] [phone]* (retail)       [address] [place], [place]  [address]       Ph: [phone]       Fax: [phone]   RxID:   [id number]

## 2010-12-12 NOTE — Progress Notes (Signed)
Summary: medication question  Phone Note Call from Patient Call back at Home Phone [phone]   Caller: Spouse Reason for Call: Talk to Nurse, Talk to Doctor Summary of Call: they have question regarding medication pt was perscribed lipitor about 6 months ealier and has not been taking it due to treatment. So at her next visit Dr. Tenny Craw gave her crestor. Since she has been on crestor pt has been having leg cramps so they where wondering if Dr. Tenny Craw knew that she wasn't taking the lipitor and if patient should still take crestor or can she go back on lipitor Initial call taken by: Omer Jack,  Mar 15, 2010 3:08 PM  Follow-up for Phone Call        Called pt. and the husband back...she is having leg pains on Crestor 10mg  every day. Advised her to decrease dose to 5 mg every day and give this at least a week and if not better to call us back. She is scheduled for repeat lab work in about 5 to 6 weeks. Will let Dr.Vallory Oetken know. Follow-up by: J REISS RN    New/Updated Medications: CRESTOR 10 MG TABS (ROSUVASTATIN CALCIUM) 1 half tab every day at bedtime

## 2010-12-12 NOTE — Progress Notes (Signed)
Summary: Legs have stop hurting since stopping Crestor  Phone Note Call from Patient Call back at Home Phone [phone]   Caller: Patient Summary of Call: Pt calling regarding her legs they have stop hurting since she have stop taking Crestor Initial call taken by: Judie Grieve,  May 05, 2010 9:53 AM  Follow-up for Phone Call        [facility] for call back. Follow-up by: Suzan Garibaldi RN  Additional Follow-up for Phone Call Additional follow up Details #1::        will discuss on return. Additional Follow-up by: Sherrill Raring, MD, [facility],  May 06, 2010 10:35 PM     Appended Document: Legs have stop hurting since stopping Crestor LMOM with above information.

## 2010-12-12 NOTE — Progress Notes (Signed)
Summary: c/o arm went cold on yesterday  Phone Note Call from Patient Call back at Home Phone [phone]   Caller: Patient Reason for Call: Talk to Nurse Summary of Call: per pt calling back today/no problems today/arms went cold  late yesterday Initial call taken by: Lorne Skeens,  April 18, 2010 11:40 AM  Follow-up for Phone Call        Called patient back...she reports that yesterday she went from her house that was air conditioned to outside where it was warm and she had an episode where her arms became cold lasting 30 to 60 seconds. She denies numbness, tingling,  or discoloration. She has not had another episode since yesterday. She does have a history of a Right mastectomy and a Left lumpectomy. Advised her I would let Dr.Kirin Brandenburger know and call her if she wants anything to be done. Patient will be calling back in about a week concerning the Crestor issue. Follow-up by: J REISS RN     Appended Document: c/o arm went cold on yesterday I would recomm following progresssion of symptoms.  Symptoms are atypical.

## 2010-12-12 NOTE — Letter (Signed)
[facility]   Imported By: Maryln Gottron 04/27/2010 08:42:40  _____________________________________________________________________  External Attachment:    Type:   Image     Comment:   External Document

## 2010-12-12 NOTE — Assessment & Plan Note (Signed)
Summary: checkup after completing Chemo/bs   History of Present Illness Visit Type: Follow-up Visit Primary GI MD: Stan Head MD [facility] Primary Arlen Dupuis: Herb Grays, MD Requesting Samarion Ehle: n/a Chief Complaint: f/u after Chemo treatment for recurrent breast cancer. Pt states her bowels are doing well and denies any problems at this time.  History of Present Illness:   After staring Arimidex she began to have bilateral LQ pain and defecation. She thought it was related to the Arimidex, she stopped it and is not having that problem anymore. Dr. Darrold Span thought it was probably not due to the medication and that we should see her.  Still has some difficulty getting foods from mouth to esophagus when anxous but no impact dysphagia.   GI Review of Systems      Denies abdominal pain, acid reflux, belching, bloating, chest pain, dysphagia with liquids, dysphagia with solids, heartburn, loss of appetite, nausea, vomiting, vomiting blood, weight loss, and  weight gain.        Denies anal fissure, black tarry stools, change in bowel habit, constipation, diarrhea, diverticulosis, fecal incontinence, heme positive stool, hemorrhoids, irritable bowel syndrome, jaundice, light color stool, liver problems, rectal bleeding, and  rectal pain.    Current Medications (verified): 1)  Xanax 0.5 Mg Tabs (Alprazolam) .... At Bedtime 2)  Amitriptyline Hcl 150 Mg Tabs (Amitriptyline Hcl) .... Take 1 Tab By Mouth At Bedtime 3)  Omeprazole 20 Mg  Cpdr (Omeprazole) .... Take One By Mouth Two Times A Day 4)  Vitamin B-6 50 Mg Tabs (Pyridoxine Hcl) .... Three Times A Day 5)  Vitamin B-12 1000 Mcg Tabs (Cyanocobalamin) .... Once Daily 6)  Vitamin D3 1000 Unit Tabs (Cholecalciferol) .... Take 2 Tablets Daily 7)  Lipitor 10 Mg Tabs (Atorvastatin Calcium) .[name].. 1 Tab Every Evening 8)  Arimidex 1 Mg Tabs (Anastrozole) .... One Tablet By Mouth Once Daily  Allergies (verified): 1)  ! Benadryl 2)  ! Codeine 3)  !  Morphine  Past History:  Past Medical History: Reviewed history from 05/20/2009 and no changes required. Breast Cancer 2001, 2008 Anemia Anxiety Disorder COPD Hyperlipidemia Pneumonia/Pleurisy 12/2008 Arthritis Breast Cancer XRT/chemo 2001-2002 Depression  Past Surgical History: Reviewed history from 05/20/2009 and no changes required. Breast-Lumpectomy 2001 Breast-Mastectomy-modified radical 2008 Reconstruction surgery,2008,2009, 2010 ERCP, CBD stone extraction 2010 Cholecystectomy 2010 EGD/dili 2010  Family History: Reviewed history from 05/20/2009 and no changes required. Family History of Breast Cancer: Family History of Ovarian Cancer: Family History of Pancreatic Cancer: Family History of Heart Disease: Father Primary Biliary Cirrhosis-sister died age 53  Social History: Reviewed history from 03/15/2009 and no changes required. Occupation: Retired Patient is a former smoker.  Alcohol Use - yes occ Daily Caffeine Use -2 Illicit Drug Use - no  Review of Systems       anxiety at times  Vital Signs:  Patient profile:   70 year old female Height:      66 inches Weight:      125.38 pounds BMI:     20.31 O2 Sat:      96 % on Room air Pulse rate:   105 / minute Pulse rhythm:   regular BP sitting:   98 / 62  (left arm) Cuff size:   regular  Vitals Entered By: Christie Nottingham CMA Duncan Dull) (July 13, 2010 2:29 PM)  O2 Flow:  Room air  Physical Exam  General:  Well developed, well nourished, no acute distress. Abdomen:  Soft, nontender and nondistended. No masses, hepatosplenomegal. Normal bowel sounds.  Extremities:  no edema    Impression & Recommendations:  Problem # 1:  CHANGE IN BOWELS (EAV-409.81) Assessment New ? Arimidex causing - probably given that it started after she began the medication adn stopped with holdimg she has never had a colonoscopy so will pursue to be sure no other cause of change in bowels Risks, benefits,and indications  of endoscopic procedure(s) were reviewed with the patient and all questions answered.   Orders: Colonoscopy (Colon)  Problem # 2:  GERD (ICD-530.81) Assessment: Improved ok on omeprazole she still has some anxiety-indued dysphagia (oropharyngeal it seems)  Patient Instructions: 1)  Please pick up your medications at your pharmacy. 2)  We will see you at your procedure on 09/05/10. 3)  [facility] Patient Information Guide given to patient.  4)  Colonoscopy and Flexible Sigmoidoscopy brochure given.  5)  Copy sent to : Jama Flavors, MD, Herb Grays, MD 6)  The medication list was reviewed and reconciled.  All changed / newly prescribed medications were explained.  A complete medication list was provided to the patient / caregiver. Prescriptions: MOVIPREP 100 GM  SOLR (PEG-KCL-NACL-NASULF-NA ASC-C) As per prep instructions.  #1 x 0   Entered by:   Francee Piccolo CMA (AAMA)   Authorized by:   [name] MD, [facility]   Signed by:   Francee Piccolo CMA (AAMA) on 07/13/2010   Method used:   Electronically to        [facility]  [facility] [phone]* (retail)       [address] [place], [place]  [address]       Ph: [phone]       Fax: [phone]   RxID:   [id number]

## 2010-12-12 NOTE — Progress Notes (Signed)
Summary: left leg swelling -stop crestor  Phone Note Call from Patient Call back at Home Phone [phone]   Caller: Patient Reason for Call: Talk to Nurse Summary of Call: c/o left leg swelling - was given a brace by her pcp. stop taken crestor 2 night ago.  Initial call taken by: Lorne Skeens,  April 17, 2010 10:11 AM  Follow-up for Phone Call        [facility] for call back. Follow-up by: Suzan Garibaldi RN  Additional Follow-up for Phone Call Additional follow up Details #1::        Called patient back. She states that she did feel better on the decreased dose of Crestor 5mg  every day. Then she states that her left knee and the inner part of her leg became swollen and painful so she saw Dr.Tammy Spears. She states that the PCP thought that she was abusing her knee with too much yard work so she gave her a knee brace. She states that the knee was a little better but she is sure that the problem is from the Crestor so she stopped the medication 2 days ago. Advised her to call back in 1 week to let us know her status then we will discuss what meds to take and when to have repeat labs and MD visit. She verbalized understanding.  Additional Follow-up by: Suzan Garibaldi RN    Additional Follow-up for Phone Call Additional follow up Details #2::    OK.  Await patients's call Follow-up by: Sherrill Raring, MD, [facility],  April 18, 2010 11:13 AM

## 2010-12-14 NOTE — Progress Notes (Signed)
Summary: pt calling for test results  Phone Note Call from Patient   Caller: Patient [phone] Reason for Call: Talk to Nurse, Lab or Test Results Summary of Call: pt calling back re test results Initial call taken by: Glynda Jaeger,  October 24, 2010 9:51 AM  Follow-up for Phone Call        Called patient with lab results...she will increase Lipitor to 40mg  at bedtime and return for labs on 12/27/2010.  Layne Benton, RN, BSN  October 24, 2010 4:36 PM       New/Updated Medications: LIPITOR 40 MG TABS (ATORVASTATIN CALCIUM) 1 tab prior to bedtime Prescriptions: LIPITOR 40 MG TABS (ATORVASTATIN CALCIUM) 1 tab prior to bedtime  #30 x 6   Entered by:   Layne Benton, RN, BSN   Authorized by:   Sherrill Raring, MD, [facility]   Signed by:   Layne Benton, RN, BSN on 10/24/2010   Method used:   Electronically to        [facility] [phone]* (retail)       [address] [place], [place]  [address]       Ph: [phone]       Fax: [phone]   RxID:   [id number]

## 2010-12-15 ENCOUNTER — Other Ambulatory Visit: Payer: Self-pay | Admitting: Oncology

## 2010-12-15 DIAGNOSIS — Z853 Personal history of malignant neoplasm of breast: Secondary | ICD-10-CM

## 2010-12-20 ENCOUNTER — Ambulatory Visit
Admission: RE | Admit: 2010-12-20 | Discharge: 2010-12-20 | Disposition: A | Discharge status: Home / Self Care | Payer: Medicare HMO | Source: Ambulatory Visit | Attending: Oncology | Admitting: Oncology

## 2010-12-20 DIAGNOSIS — Z853 Personal history of malignant neoplasm of breast: Secondary | ICD-10-CM

## 2010-12-27 ENCOUNTER — Other Ambulatory Visit: Payer: Self-pay | Admitting: Internal Medicine

## 2010-12-27 ENCOUNTER — Other Ambulatory Visit (INDEPENDENT_AMBULATORY_CARE_PROVIDER_SITE_OTHER): Payer: Medicare HMO

## 2010-12-27 ENCOUNTER — Encounter: Payer: Self-pay | Admitting: Internal Medicine

## 2010-12-27 DIAGNOSIS — E7800 Pure hypercholesterolemia, unspecified: Secondary | ICD-10-CM

## 2010-12-27 DIAGNOSIS — E785 Hyperlipidemia, unspecified: Secondary | ICD-10-CM

## 2010-12-27 LAB — LIPID PANEL
Cholesterol: 204 mg/dL — ABNORMAL HIGH (ref 0–200)
HDL: 71.8 mg/dL
Total CHOL/HDL Ratio: 3
Triglycerides: 111 mg/dL (ref 0.0–149.0)
VLDL: 22.2 mg/dL (ref 0.0–40.0)

## 2010-12-27 LAB — AST: AST: 25 U/L (ref 0–37)

## 2010-12-29 ENCOUNTER — Telehealth: Payer: Self-pay | Admitting: Internal Medicine

## 2011-01-09 NOTE — Progress Notes (Addendum)
Summary: pt calling for lab results  Phone Note Call from Patient   Caller: Patient [phone] ok to leave message Reason for Call: Talk to Nurse, Lab or Test Results Complaint: Breathing Problems Summary of Call: pt calling for lab results  Initial call taken by: Glynda Jaeger,  December 29, 2010 2:23 PM  Follow-up for Phone Call        Called patient with lab results. She advised me that she took Crestor in the past but had leg pains and could not tolerate.  Advised her that I will speak with Dr.Carlissa Pesola about the lipid lowering medication and call her back.  Layne Benton, RN, BSN  December 29, 2010 4:14 PM    Additional Follow-up for Phone Call Additional follow up Details #1::        Is there any statin she has not tried.  needs to get LDL down. Additional Follow-up by: Sherrill Raring, MD, [facility],  December 31, 2010 11:12 PM     Appended Document: pt calling for lab results Called patient again. She failed Crestor because of the leg pains but is tolerating Lipitor 40mg  every day without problems although she is not at goal. She has not tried any other statins. Will discuss with Dr.Arnell Mausolf and call her back.  Appended Document: pt calling for lab results Get back on Lipitor 40.  Will check lipids and AST in 8 wks.  Appended Document: pt calling for lab results Patient aware to stay on Lipitor 40mg  every day. She will have repeat labs done on 03/20/2011.

## 2011-01-31 ENCOUNTER — Encounter (INDEPENDENT_AMBULATORY_CARE_PROVIDER_SITE_OTHER): Payer: Medicare HMO | Admitting: *Deleted

## 2011-01-31 ENCOUNTER — Other Ambulatory Visit: Payer: Self-pay | Admitting: *Deleted

## 2011-01-31 DIAGNOSIS — I6529 Occlusion and stenosis of unspecified carotid artery: Secondary | ICD-10-CM

## 2011-02-14 ENCOUNTER — Encounter: Payer: Self-pay | Admitting: Internal Medicine

## 2011-02-20 LAB — COMPREHENSIVE METABOLIC PANEL
AST: 27 U/L (ref 0–37)
Alkaline Phosphatase: 173 U/L — ABNORMAL HIGH (ref 39–117)
Alkaline Phosphatase: 226 U/L — ABNORMAL HIGH (ref 39–117)
BUN: 5 mg/dL — ABNORMAL LOW (ref 6–23)
CO2: 30 mEq/L (ref 19–32)
CO2: 31 mEq/L (ref 19–32)
Chloride: 103 mEq/L (ref 96–112)
Chloride: 97 mEq/L (ref 96–112)
Creatinine, Ser: 0.65 mg/dL (ref 0.4–1.2)
Creatinine, Ser: 0.65 mg/dL (ref 0.4–1.2)
GFR calc Af Amer: >60 mL/min (ref >60)
GFR calc non Af Amer: >60 mL/min (ref >60)
GFR calc non Af Amer: >60 mL/min (ref >60)
Glucose, Bld: 101 mg/dL — ABNORMAL HIGH (ref 70–99)
Potassium: 3.6 mEq/L (ref 3.5–5.1)
Total Bilirubin: 0.4 mg/dL (ref 0.3–1.2)
Total Bilirubin: 0.6 mg/dL (ref 0.3–1.2)

## 2011-02-20 LAB — COMPREHENSIVE METABOLIC PANEL WITH GFR
ALT: 91 U/L — ABNORMAL HIGH (ref 0–35)
AST: 101 U/L — ABNORMAL HIGH (ref 0–37)
Albumin: 3 g/dL — ABNORMAL LOW (ref 3.5–5.2)
Alkaline Phosphatase: 200 U/L — ABNORMAL HIGH (ref 39–117)
BUN: 7 mg/dL (ref 6–23)
CO2: 28 meq/L (ref 19–32)
Calcium: 8.6 mg/dL (ref 8.4–10.5)
Chloride: 101 meq/L (ref 96–112)
Creatinine, Ser: 0.58 mg/dL (ref 0.4–1.2)
GFR calc non Af Amer: >60 mL/min
Glucose, Bld: 188 mg/dL — ABNORMAL HIGH (ref 70–99)
Potassium: 4.2 meq/L (ref 3.5–5.1)
Sodium: 135 meq/L (ref 135–145)
Total Bilirubin: 0.5 mg/dL (ref 0.3–1.2)
Total Protein: 6.3 g/dL (ref 6.0–8.3)

## 2011-02-20 LAB — CBC
HCT: 31.6 % — ABNORMAL LOW (ref 36.0–46.0)
HCT: 39.6 % (ref 36.0–46.0)
Hemoglobin: 11.4 g/dL — ABNORMAL LOW (ref 12.0–15.0)
MCHC: 33.5 g/dL (ref 30.0–36.0)
MCV: 89.3 fL (ref 78.0–100.0)
MCV: 89.5 fL (ref 78.0–100.0)
Platelets: 223 K/uL (ref 150–400)
RBC: 3.53 MIL/uL — ABNORMAL LOW (ref 3.87–5.11)
RBC: 4.44 MIL/uL (ref 3.87–5.11)
RDW: 15.1 % (ref 11.5–15.5)
WBC: 8.8 K/uL (ref 4.0–10.5)
WBC: 9.3 10*3/uL (ref 4.0–10.5)

## 2011-02-20 LAB — LIPASE, BLOOD: Lipase: 18 U/L (ref 11–59)

## 2011-02-27 LAB — DIFFERENTIAL
Basophils Absolute: 0 10*3/uL (ref 0.0–0.1)
Basophils Relative: 0 % (ref 0–1)
Lymphocytes Relative: 12 % (ref 12–46)
Monocytes Absolute: 0.5 10*3/uL (ref 0.1–1.0)
Neutro Abs: 9.5 10*3/uL — ABNORMAL HIGH (ref 1.7–7.7)
Neutrophils Relative %: 83 % — ABNORMAL HIGH (ref 43–77)

## 2011-02-27 LAB — CBC
Hemoglobin: 11.7 g/dL — ABNORMAL LOW (ref 12.0–15.0)
Hemoglobin: 9.8 g/dL — ABNORMAL LOW (ref 12.0–15.0)
MCHC: 35 g/dL (ref 30.0–36.0)
MCV: 89.6 fL (ref 78.0–100.0)
MCV: 89.6 fL (ref 78.0–100.0)
Platelets: 233 10*3/uL (ref 150–400)
Platelets: 240 10*3/uL (ref 150–400)
Platelets: 297 10*3/uL (ref 150–400)
RBC: 3.17 MIL/uL — ABNORMAL LOW (ref 3.87–5.11)
RBC: 3.71 MIL/uL — ABNORMAL LOW (ref 3.87–5.11)
RDW: 13.7 % (ref 11.5–15.5)
RDW: 13.7 % (ref 11.5–15.5)
RDW: 13.9 % (ref 11.5–15.5)
WBC: 8.1 10*3/uL (ref 4.0–10.5)

## 2011-02-27 LAB — CULTURE, BLOOD (ROUTINE X 2)
Culture: NO GROWTH
Culture: NO GROWTH

## 2011-02-27 LAB — BASIC METABOLIC PANEL
BUN: 9 mg/dL (ref 6–23)
BUN: 9 mg/dL (ref 6–23)
CO2: 26 mEq/L (ref 19–32)
Calcium: 9.3 mg/dL (ref 8.4–10.5)
Chloride: 101 mEq/L (ref 96–112)
Chloride: 106 mEq/L (ref 96–112)
Creatinine, Ser: 0.53 mg/dL (ref 0.4–1.2)
Creatinine, Ser: 0.63 mg/dL (ref 0.4–1.2)
Creatinine, Ser: 0.75 mg/dL (ref 0.4–1.2)
GFR calc Af Amer: >60 mL/min (ref >60)
GFR calc non Af Amer: >60 mL/min (ref >60)
Glucose, Bld: 81 mg/dL (ref 70–99)
Sodium: 138 mEq/L (ref 135–145)

## 2011-02-27 LAB — RETICULOCYTES: Retic Count, Absolute: 28.2 10*3/uL (ref 19.0–186.0)

## 2011-02-27 LAB — IRON AND TIBC
Iron: 16 ug/dL — ABNORMAL LOW (ref 42–135)
Saturation Ratios: 8 % — ABNORMAL LOW (ref 20–55)
TIBC: 192 ug/dL — ABNORMAL LOW (ref 250–470)

## 2011-02-27 LAB — LIPID PANEL
LDL Cholesterol: 89 mg/dL (ref 0–99)
Triglycerides: 70 mg/dL (ref <150)

## 2011-02-27 LAB — TSH: TSH: 2.605 u[IU]/mL (ref 0.350–4.500)

## 2011-02-27 LAB — FOLATE: Folate: 6.4 ng/mL

## 2011-02-27 LAB — VITAMIN B12: Vitamin B-12: 687 pg/mL (ref 211–911)

## 2011-03-07 ENCOUNTER — Other Ambulatory Visit: Payer: Self-pay | Admitting: Oncology

## 2011-03-07 ENCOUNTER — Encounter (HOSPITAL_BASED_OUTPATIENT_CLINIC_OR_DEPARTMENT_OTHER): Payer: Medicare HMO | Admitting: Oncology

## 2011-03-07 DIAGNOSIS — M899 Disorder of bone, unspecified: Secondary | ICD-10-CM

## 2011-03-07 DIAGNOSIS — Z170 Estrogen receptor positive status [ER+]: Secondary | ICD-10-CM

## 2011-03-07 DIAGNOSIS — C792 Secondary malignant neoplasm of skin: Secondary | ICD-10-CM

## 2011-03-07 DIAGNOSIS — C50419 Malignant neoplasm of upper-outer quadrant of unspecified female breast: Secondary | ICD-10-CM

## 2011-03-07 LAB — COMPREHENSIVE METABOLIC PANEL
ALT: 27 U/L (ref 0–35)
CO2: 28 mEq/L (ref 19–32)
Calcium: 9.2 mg/dL (ref 8.4–10.5)
Chloride: 104 mEq/L (ref 96–112)
Creatinine, Ser: 0.69 mg/dL (ref 0.40–1.20)
Glucose, Bld: 76 mg/dL (ref 70–99)
Total Bilirubin: 0.5 mg/dL (ref 0.3–1.2)
Total Protein: 6.8 g/dL (ref 6.0–8.3)

## 2011-03-07 LAB — CBC WITH DIFFERENTIAL/PLATELET
Basophils Absolute: 0 10*3/uL (ref 0.0–0.1)
Eosinophils Absolute: 0.1 10*3/uL (ref 0.0–0.5)
HCT: 33.7 % — ABNORMAL LOW (ref 34.8–46.6)
HGB: 11.3 g/dL — ABNORMAL LOW (ref 11.6–15.9)
LYMPH%: 23.4 % (ref 14.0–49.7)
MONO#: 0.5 10*3/uL (ref 0.1–0.9)
NEUT#: 3.7 10*3/uL (ref 1.5–6.5)
NEUT%: 66 % (ref 38.4–76.8)
Platelets: 232 10*3/uL (ref 145–400)
WBC: 5.6 10*3/uL (ref 3.9–10.3)
lymph#: 1.3 10*3/uL (ref 0.9–3.3)

## 2011-03-12 ENCOUNTER — Encounter: Payer: Self-pay | Admitting: Family Medicine

## 2011-03-14 ENCOUNTER — Encounter: Payer: Self-pay | Admitting: Family Medicine

## 2011-03-14 ENCOUNTER — Ambulatory Visit (INDEPENDENT_AMBULATORY_CARE_PROVIDER_SITE_OTHER): Payer: Medicare HMO | Admitting: Family Medicine

## 2011-03-14 ENCOUNTER — Ambulatory Visit: Payer: Medicare HMO | Admitting: Family Medicine

## 2011-03-14 DIAGNOSIS — K219 Gastro-esophageal reflux disease without esophagitis: Secondary | ICD-10-CM

## 2011-03-14 DIAGNOSIS — F411 Generalized anxiety disorder: Secondary | ICD-10-CM

## 2011-03-14 DIAGNOSIS — E7800 Pure hypercholesterolemia, unspecified: Secondary | ICD-10-CM

## 2011-03-14 DIAGNOSIS — K5900 Constipation, unspecified: Secondary | ICD-10-CM

## 2011-03-14 DIAGNOSIS — Z8619 Personal history of other infectious and parasitic diseases: Secondary | ICD-10-CM | POA: Insufficient documentation

## 2011-03-14 DIAGNOSIS — I6529 Occlusion and stenosis of unspecified carotid artery: Secondary | ICD-10-CM

## 2011-03-14 DIAGNOSIS — M8580 Other specified disorders of bone density and structure, unspecified site: Secondary | ICD-10-CM

## 2011-03-14 DIAGNOSIS — Z853 Personal history of malignant neoplasm of breast: Secondary | ICD-10-CM

## 2011-03-14 DIAGNOSIS — M949 Disorder of cartilage, unspecified: Secondary | ICD-10-CM

## 2011-03-14 HISTORY — DX: Other specified disorders of bone density and structure, unspecified site: M85.80

## 2011-03-14 NOTE — Assessment & Plan Note (Signed)
Has had 2 episodes in past agrees to Zostavax at next visit

## 2011-03-14 NOTE — Patient Instructions (Signed)
Hypercholesterolemia High Blood Cholesterol Cholesterol is a white, waxy, fat-like protein needed by your body in small amounts. The liver makes all the cholesterol you need. It is carried from the liver by the blood through the blood vessels. Deposits (plaque) may build up on blood vessel walls. This makes the arteries narrower and stiffer. Plaque increases the risk for heart attack and stroke. You cannot feel your cholesterol level even if it is very high. The only way to know is by a blood test to check your lipid (fats) levels. Once you know your cholesterol levels, you should keep a record of the test results. Work with your caregiver to to keep your levels in the desired range. WHAT THE RESULTS MEAN:  Total cholesterol is a rough measure of all the cholesterol in your blood.   LDL is the so-called bad cholesterol. This is the type that deposits cholesterol in the walls of the arteries. You want this level to be low.   HDL is the good cholesterol because it cleans the arteries and carries the LDL away. You want this level to be high.   Triglycerides are fat that the body can either burn for energy or store. High levels are closely linked to heart disease.  DESIRED LEVELS:  Total cholesterol below 200.   LDL below 100 for people at risk, below 70 for very high risk.   HDL above 50 is good, above 60 is best.   Triglycerides below 150.  HOW TO LOWER YOUR CHOLESTEROL:  Diet.   Choose fish or white meat chicken and [place], roasted or baked. Limit fatty cuts of red meat, fried foods, and processed meats, such as sausage and lunch meat.   Eat lots of fresh fruits and vegetables. Choose whole grains, beans, pasta, potatoes and cereals.   Use only small amounts of olive, corn or canola oils. Avoid butter, mayonnaise, shortening or palm kernel oils. Avoid foods with trans-fats.   Use skim/nonfat milk and low-fat/nonfat yogurt and cheeses. Avoid whole milk, cream, ice cream, egg yolks and  cheeses. Healthy desserts include angel food cake, gingersnaps, animal crackers, hard candy, popsicles, and low-fat/nonfat frozen yogurt. Avoid pastries, cakes, pies and cookies.   Exercise.   A regular program helps decrease LDL and raises HDL.   Helps with weight control.   Do things that increase your activity level like gardening, walking, or taking the stairs.   Medication.   May be prescribed by your caregiver to help lowering cholesterol and the risk for heart disease.   You may need medicine even if your levels are normal if you have several risk factors.  HOME CARE INSTRUCTIONS  Follow your diet and exercise programs as suggested by your caregiver.   Take medications as directed.   Have blood work done when your caregiver feels it is necessary.  MAKE SURE YOU:   Understand these instructions.   Will watch your condition.   Will get help right away if you are not doing well or get worse.  Document Released: 10/29/2005 Document Re-Released: 10/11/2008 [facility] Patient Information 2011 [place], [place].  Release of Records Dr Dewain Penning

## 2011-03-14 NOTE — Assessment & Plan Note (Signed)
Patient notes she follows with cardiology and last had it checked in 2/12, only mild elevation noted, tolerating Lipitor, will not make any significant changes to her meds today

## 2011-03-15 ENCOUNTER — Other Ambulatory Visit: Payer: Self-pay | Admitting: Family Medicine

## 2011-03-15 ENCOUNTER — Encounter: Payer: Self-pay | Admitting: Family Medicine

## 2011-03-15 DIAGNOSIS — K5909 Other constipation: Secondary | ICD-10-CM | POA: Insufficient documentation

## 2011-03-15 DIAGNOSIS — F419 Anxiety disorder, unspecified: Secondary | ICD-10-CM

## 2011-03-15 NOTE — Assessment & Plan Note (Signed)
Patient feels she is doing well inthis regard at this time. Is using Alprazolam infrequently prn with good results, may continue the same

## 2011-03-15 NOTE — Telephone Encounter (Signed)
Please advise 

## 2011-03-15 NOTE — Telephone Encounter (Signed)
Patient needs a refill on her xanax. [facility] in [place], #[phone].

## 2011-03-15 NOTE — Assessment & Plan Note (Signed)
Patient is taking in at least 3 servings of milk daily. We will monitor labs and Dexa scans and patient encouraged to stay active.

## 2011-03-15 NOTE — Assessment & Plan Note (Signed)
Improved s/p her cholecystectomy, encouraged ongoing hi fiber diet, good fluid intake, consider a probiotic or yogurt daily and maintain adequate exercise

## 2011-03-15 NOTE — Assessment & Plan Note (Signed)
No recent flares has done well since her Esophageal dilatation, may use Omeprazole prn

## 2011-03-15 NOTE — Assessment & Plan Note (Signed)
Patient following closely with Oncology has had 3 recurrences all on the right side and is doing well at this time after undergoing a lumpectomy in 2001, a modified radical mastectomy in 2008 and excision of recurrent lesions on her abdominal wall in 2010 as well as chemo and radiation. Patient now maintained on Arimedex with good results. No changes today

## 2011-03-15 NOTE — Progress Notes (Signed)
Anne [name] [id number] 19-Jun-1941 03/15/2011      Progress Note New Patient  Subjective  Chief Complaint  Chief Complaint  Patient presents with  . Establish Care    new patient    HPI  Patient is a 70 year old Caucasian female in today to establish new patient appointment. Overall she feels she's doing well at the present time but he is interested in a new PMD. Her major medical history consists of recurrent right-sided breast cancer. She was a smoker up until 2008 smoking greater than a pack per day for 50 years. Her initial cancer diagnosis was in 2001 when she underwent a right-sided lumpectomy chemotherapy and radiation. Then in 2008 she had a recurrence despite 5 years of tamoxifen in the intervening period. She went through modified radical: Mastectomy in 2008 only to experience a abdominal wall recurrence in 2010. She once again underwent surgery and has had no further recurrence. Presently is maintained on Demadex and is following closely with oncology. Overall at present she feels she's doing well she believes her anxiety is well controlled on her current course of medications. In the past she's had quite a bit of trouble with GI issues as well most notably dysphasia reflux which improved with stretching. She also underwent a cholecystectomy several years ago. Prior to that she been struggling with constipation she then developed some diarrhea but now reports that her bowels move relatively well each day without any bloody or tarry stool. She does note some stiffness and discomfort in her knees first in the morning but this always improves with walking and right now she feels her symptoms are better than they've been in quite some time. She walks routinely. No recent fevers, chills, congestion, chest pain, palpitations, shortness of breath, GI or GU concerns at this time.  Past Medical History  Diagnosis Date  . Anemia   . Anxiety   . COPD (chronic obstructive pulmonary disease)     . Hyperlipidemia   . Pneumonia 2-10    pleurisy  . Arthritis   . Depression   . GERD (gastroesophageal reflux disease)     improved s/p cholecystectomy and esophagus dilatation  . History of chicken pox   . History of shingles     2 episodes  . History of measles   . Osteopenia 03/14/2011  . 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 Pure hypercholesterolemia 10/17/2010  . Constipation 03/15/2011  . 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 GERD 09/29/2009  . ESOPHAGEAL STRICTURE 03/29/2009  . CONSTIPATION 09/29/2009  . CAROTID ARTERY STENOSIS 01/13/2010  . ANXIETY 09/29/2009  . ALKALINE PHOSPHATASE, ELEVATED 03/15/2009    Past Surgical History  Procedure Date  . Anterior cruciate ligament repair 08, 09, 10  . Ercp, cbd stone extraction 2010  . Cholecystectomy 2010  . Egd/ dili 2010  . Breast surgery 2001, 2008    lumpectomy (01), Mastectomy modified radical (08), breast reconstruction, CA lesions excised lateral abd wall 2010    Family History  Problem Relation Age of Onset  . Heart disease Father   . Cancer Father     lung cancer/smoker  . Cirrhosis Sister     Primary Biliary  . Cancer Other     Breast, ovarian, pancreatic  . Stroke Maternal Grandmother   . Alcohol abuse Maternal Grandfather   . Heart disease Paternal Grandfather   . Anxiety disorder Sister   . Osteoporosis Sister   . Cancer Sister  skin  . Osteoporosis Sister   . Other Mother     tic deleauroux    History   Social History  . Marital Status: Married    Spouse Name: N/A    Number of Children: N/A  . Years of Education: N/A   Occupational History  . Not on file.   Social History Main Topics  . Smoking status: Former Smoker    Quit date: 05/22/2007  . Smokeless tobacco: Never Used  . Alcohol Use: Yes     1 bottle of wine a week  . Drug Use: No  . Sexually Active: Yes -- Female partner(s)   Other Topics Concern  . Not on file   Social History Narrative  . No  narrative on file    Current Outpatient Prescriptions on File Prior to Visit  Medication Sig Dispense Refill  . ALPRAZolam (XANAX) 0.5 MG tablet Take 0.5 mg by mouth at bedtime as needed.        [name] amitriptyline (ELAVIL) 150 MG tablet Take 150 mg by mouth at bedtime.        [name] anastrozole (ARIMIDEX) 1 MG tablet Take 1 mg by mouth daily.        [name] atorvastatin (LIPITOR) 40 MG tablet Take 40 mg by mouth daily.       . Cholecalciferol (VITAMIN D3) 1000 UNITS CAPS Take 2 tablets by mouth daily.        [name] omeprazole (PRILOSEC) 20 MG capsule Take 20 mg by mouth 2 (two) times daily.        [name] pyridOXINE (VITAMIN B-6) 50 MG tablet Take 50 mg by mouth 3 (three) times daily.        . vitamin B-12 (CYANOCOBALAMIN) 1000 MCG tablet Take 1,000 mcg by mouth daily.          Allergies  Allergen Reactions  . Codeine   . Diphenhydramine Hcl   . Morphine     Review of Systems  Review of Systems  Constitutional: Negative for fever, chills, weight loss and malaise/fatigue.  HENT: Negative for hearing loss, nosebleeds and congestion.   Eyes: Negative for discharge.  Respiratory: Negative for cough, sputum production, shortness of breath and wheezing.   Cardiovascular: Negative for chest pain, palpitations and leg swelling.  Gastrointestinal: Negative for heartburn, nausea, vomiting, abdominal pain, diarrhea, constipation and blood in stool.  Genitourinary: Negative for dysuria, urgency, frequency and hematuria.  Musculoskeletal: Negative for myalgias, back pain and falls.  Skin: Negative for rash.  Neurological: Negative for dizziness, tremors, sensory change, focal weakness, loss of consciousness, weakness and headaches.  Endo/Heme/Allergies: Negative for polydipsia. Does not bruise/bleed easily.  Psychiatric/Behavioral: Negative for depression and suicidal ideas. The patient is not nervous/anxious and does not have insomnia.     Objective  BP 111/74  Pulse 89  Temp(Src) 99 F (37.2 C) (Oral)  Ht  5\' 6"  (1.676 m)  Wt 129 lb (58.514 kg)  BMI 20.82 kg/m2  SpO2 96%  Physical Exam  Physical Exam  Constitutional: She is oriented to person, place, and time and well-developed, well-nourished, and in no distress. No distress.  HENT:  Head: Normocephalic and atraumatic.  Right Ear: External ear normal.  Left Ear: External ear normal.  Nose: Nose normal.  Mouth/Throat: Oropharynx is clear and moist. No oropharyngeal exudate.  Eyes: Conjunctivae are normal. Pupils are equal, round, and reactive to light. Right eye exhibits no discharge. Left eye exhibits no discharge. No scleral icterus.  Neck: Normal range of motion. Neck supple.  No thyromegaly present.  Cardiovascular: Normal rate, regular rhythm, normal heart sounds and intact distal pulses.   No murmur heard. Pulmonary/Chest: Effort normal and breath sounds normal. No respiratory distress. She has no wheezes. She has no rales.  Abdominal: Soft. Bowel sounds are normal. She exhibits no distension and no mass. There is no tenderness.  Musculoskeletal: Normal range of motion. She exhibits no edema and no tenderness.  Lymphadenopathy:    She has no cervical adenopathy.  Neurological: She is alert and oriented to person, place, and time. She has normal reflexes. No cranial nerve deficit. Coordination normal.  Skin: Skin is warm and dry. No rash noted. She is not diaphoretic.  Psychiatric: Mood, memory and affect normal.       Assessment & Plan  PURE HYPERCHOLESTEROLEMIA Patient notes she follows with cardiology and last had it checked in 2/12, only mild elevation noted, tolerating Lipitor, will not make any significant changes to her meds today  History of shingles Has had 2 episodes in past agrees to Zostavax at next visit  PERSONAL HX BREAST CANCER Patient following closely with Oncology has had 3 recurrences all on the right side and is doing well at this time after undergoing a lumpectomy in 2001, a modified radical mastectomy  in 2008 and excision of recurrent lesions on her abdominal wall in 2010 as well as chemo and radiation. Patient now maintained on Arimedex with good results. No changes today  Osteopenia Patient is taking in at least 3 servings of milk daily. We will monitor labs and Dexa scans and patient encouraged to stay active.   GERD No recent flares has done well since her Esophageal dilatation, may use Omeprazole prn  CONSTIPATION Improved s/p her cholecystectomy, encouraged ongoing hi fiber diet, good fluid intake, consider a probiotic or yogurt daily and maintain adequate exercise  CAROTID ARTERY STENOSIS Patient may benefit from repeat US in the future  ANXIETY Patient feels she is doing well inthis regard at this time. Is using Alprazolam infrequently prn with good results, may continue the same

## 2011-03-15 NOTE — Assessment & Plan Note (Signed)
Patient may benefit from repeat US in the future

## 2011-03-16 MED ORDER — ALPRAZOLAM 0.5 MG PO TABS: 0.5000 mg | ORAL_TABLET | Freq: Every evening | ORAL | Status: DC | PRN

## 2011-03-16 NOTE — Telephone Encounter (Signed)
OK to fill with same sig, #30, 2 rf

## 2011-03-16 NOTE — Telephone Encounter (Signed)
Rx faxed to pharmacy  

## 2011-03-20 ENCOUNTER — Telehealth: Payer: Self-pay | Admitting: Family Medicine

## 2011-03-20 ENCOUNTER — Other Ambulatory Visit (INDEPENDENT_AMBULATORY_CARE_PROVIDER_SITE_OTHER): Payer: 59 | Admitting: *Deleted

## 2011-03-20 DIAGNOSIS — E7800 Pure hypercholesterolemia, unspecified: Secondary | ICD-10-CM

## 2011-03-20 LAB — LIPID PANEL
HDL: 76 mg/dL (ref >39.00)
Total CHOL/HDL Ratio: 3
Triglycerides: 123 mg/dL (ref 0.0–149.0)

## 2011-03-20 LAB — AST: AST: 25 U/L (ref 0–37)

## 2011-03-20 NOTE — Telephone Encounter (Signed)
Please advise 

## 2011-03-20 NOTE — Telephone Encounter (Signed)
Patient requests dosage change to 1/2 up to 2 tablets per day, would like next refill to be a quantity of 60 tablets for one month

## 2011-03-21 MED ORDER — ALPRAZOLAM 0.5 MG PO TABS: ORAL_TABLET | ORAL | Status: DC

## 2011-03-21 NOTE — Telephone Encounter (Signed)
Ok to change sig to read 1/2 to 2 tabs po qhs prn insomnia/anxiety. Disp # 60 with 3 rf

## 2011-03-27 NOTE — Assessment & Plan Note (Signed)
[facility]                            CARDIOLOGY OFFICE NOTE   ISABEAU, Anne [name]                      MRN:          [id number]  DATE:09/27/2008                            DOB:          07-17-1941    IDENTIFICATION:  [name] is a 70 year old, who is referred by Dr.  Darrold [name] for evaluation of chest pain.   HISTORY OF PRESENT ILLNESS:  The patient has no known history of  coronary artery disease.  Note, she is followed by Dr. Darrold [name], has a  history of breast cancer.   She was seen recently in related history of chest pain.  She states back  in October, she woke up in the middle of one night, with chest pressure,  substernal in location, no radiation, took some water, did not help,  took Pepto-Bismol set up, about an hour later the episodes eased off and  she was able to go back to sleep.  She noted some shortness of breath  with this.  After this, she was having at about 2 times per week.  Again, always when she was lying at night, not associated with activity.  About 3 weeks ago, she was started on Nexium and she notes that since  then she has only had spells one time per week again at night.  She  denies any brackish taste in her mouth.  No other reflux symptoms.   Otherwise, she remains active.  She does yard work.  She and her husband  are walking 4-5 miles per day doing about 11-13 minute a mile.  She  notes no problems with this.  No chest pressure.   CURRENT MEDICATIONS:  1. Amitriptyline 150.  2. Lorazepam 0.5.  3. Aspirin 81.  4. Vitamin D3.  5. Vitamin B12.  6. Nexium 40.   ALLERGIES:  None.   PAST MEDICAL HISTORY:  1. History of breast CA of the right breast, status post lumpectomy in      2001 with chemotherapy and radiation and also 5 years of tamoxifen.      She developed a second tumor in the right breast and underwent a      brief trial of Arimidex.  2. History of osteopenia.   SOCIAL HISTORY:  The patient is married.   Quit tobacco in July 2008  after about a 50 pack-year, drinks alcohol rarely.   FAMILY HISTORY:  Mother died at age 30.  Father died at age 33 of lung  cancer, had angina in his 22s.  One sister died of primary biliary  cirrhosis, two sisters alive, one with skin cancer.   REVIEW OF SYSTEMS:  All systems reviewed, history of syncope in 2001, no  recurrence.  Otherwise, symptoms negative to the above problem except as  noted.   PHYSICAL EXAMINATION:  GENERAL:  The patient is in no acute distress at  rest.  VITAL SIGNS:  Blood pressure is 119/81, pulse is 90, and weight 140.  HEENT:  Normocephalic and atraumatic.  EOMI.  PERL.  Mucous membranes  are moist.  NECK:  JVP is normal.  No bruits.  LUNGS:  Clear to auscultation.  No wheezes or rales.  CARDIAC:  Regular rate and rhythm, S1 and S2.  No S3, S4, or murmurs.  ABDOMEN:  Benign.  EXTREMITIES:  No masses.  No hepatomegaly.  Good distal pulses  throughout.  No bruits.  No lower extremity edema.   Chest CT done on August 13, 2008, shows no pulmonary embolus.  No  adenopathy.  Mild emphysematous changes.  Note, there is a comment of  mild atherosclerotic plaques in thoracic aorta.   A 12-lead EKG, normal sinus rhythm, 90 beats per minute, and right  bundle-branch block.   IMPRESSION:  [name] is a 70 year old woman with a history of chest  pain.  Pain is very atypical for coronary ischemia.  I think it is more  suggestive of reflux.  It seems to be improving with Nexium.  With  activity, she notes no symptoms.   She has some evidence of mild atherosclerosis on the CT scan, but again  I do not think there are any flow-limiting lesions.   For now, I would not recommend any further testing unless her symptoms  change.  I encouraged her to stay active.  I would be in touch with Dr.  Alda [name] [facility] to see if we can get a fasting lipid panel.  We will be  in touch with this.   Otherwise, again continue on current medical regimen.   I will see her on  a p.r.n. basis for symptom changes.     Pricilla Riffle, MD, [facility]  Electronically Signed    PVR/MedQ  DD: 09/27/2008  DT: 09/28/2008  Job #: [id number]   cc:   Anne [name], M.D.  Anne [name], M.D.  Anne [name], M.D.

## 2011-03-27 NOTE — Discharge Summary (Signed)
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   CLINICAL HISTORY:  [name] is a 65-year lady who is status post  lumpectomy, sentinel node evaluation several years ago for an invasive  lobular carcinoma.  She recently presented with a local recurrence.  After a lengthy discussion with the patient, she elected to come in and  have a mastectomy.  She planned also for reconstruction with a mastopexy  on the contralateral side.   HOSPITAL COURSE:  The patient admitted and taken to the operating room  where she underwent the surgery.  Postoperatively, she had an  essentially benign course.  She did have a little anxiety episode but  that resolved quickly.  By 08/28, she was feeling better.  Some Aleve  helped with her postoperative pain control.  She was able to be  discharged on the 28th.   FINAL PATHOLOGY REPORT:  Confirmed invasive lobular carcinoma 1.5 cm,  MSBR one of three.  1/3.  Three lymph nodes were negative for tumor.  ER  was positive 81%, PR positive 96%, Ki 67 was 49%.  HER-2/neu by  HercepTest was 1+ negative, and HER-2/neu by FISH was not performed.   Following discharge, the patient is to be followed in our office as well  as by Dr. Shon Hough who did the reconstruction.  She is sent home on  Tylox plus Aleve for pain.  She was to resume her amitriptyline, Ativan  and B12.      Currie Paris, M.D.  Electronically Signed     CJS/MEDQ  D:  07/16/2007  T:  07/16/2007  Job:  [id number]   cc:   Earvin Hansen L. Shon Hough, M.D.  Lennis P. Darrold Span, M.D.

## 2011-03-27 NOTE — Op Note (Signed)
NAME:  Anne [name], Anne [name]               ACCOUNT NO.:  [id number]   MEDICAL RECORD NO.:  [id number]          PATIENT TYPE:  AMB   LOCATION:  DSC                          FACILITY:  [facility]   PHYSICIAN:  Earvin Hansen L. Shon Hough, M.D.DATE OF BIRTH:  [birth year]   DATE OF PROCEDURE:  11/17/2007  DATE OF DISCHARGE:                               OPERATIVE REPORT   INDICATIONS:  A 70 year old lady status post right mastectomy for breast  cancer.  She had a tissue expander placed immediately at that time.  However, the skin overlying the flap was very thin and broke down post  op.  We attempted to salvage the skin, but over a period of time it  weakened, causing [place] to have to remove the tissue expander temporarily  and treat the wound as an open wound.  The wound is finally healed, but  still has weakening, measuring approximately 2 x 2.5 cm medial aspect of  the right mastectomy.   PROCEDURES DONE:  Excision of scars right chest, tissue expansion.   ANESTHESIA:  General.   PROCEDURE:  Preoperatively, the patient was set up and drawn for the  midline of the chest.  The anterior axillary line as well as the new  inframammary fold.  She then underwent general anesthesia intubated  orally.  Prep was done to the chest and breast areas in a routine  fashion using hibiclens soap and solution, walled off with sterile  towels and drapes so as to make a sterile field.  Xylocaine 0.5% with  epinephrine was injected locally, 1:200,000 in its concentration, a  total of 50 cc.  The previous transverse incision was opened.  Then  medially, the scar surface was totally excised down to the underlying  deep subcutaneous tissue.  Laterally, finger dissection, as well as  use  of the lateral retractor was used to dissect subpectorally.  The flap  over to the scar vision area, and I was able to dissect under that as  well.  Hemostasis was maintained with the Bovie unit on coagulation.  Medially, finger dissection was  done to give a cleavage area, as well as  superior aspect of the flap.  Laterally the same.  After the proper  hemostasis, the wound was irrigated with copious amounts of saline and I  was able to put a tissue expander in, [place] type with no fill.  The  tissue expander was placed in the subpectoral plane and the port placed  laterally into the axillary bl anterior areas and secured with 3-0  Monocryl.  Next, the muscles were padded back with 2-0 Monocryl.  The  subcutaneous tissues deep with 2-0 Monocryl.  The subdermal suture with  3-0 Monocryl, then I ran a subcuticular stitch with 3-0 Monocryl.  The  wounds were cleansed, Steri-Strips with soft dressings applied to all  the areas.  She withstood the procedures very well and was taken to  recovery in excellent condition.      Yaakov Guthrie. Shon Hough, M.D.  Electronically Signed     GLT/MEDQ  D:  11/17/2007  T:  11/17/2007  Job:  [id number]

## 2011-03-27 NOTE — Procedures (Signed)
DUPLEX DEEP VENOUS EXAM - LOWER EXTREMITY   INDICATION:  Right lower extremity pain and swelling.   HISTORY:  Edema:  Yes.  Trauma/Surgery:  No.  Pain:  Yes.  PE:  No.  Previous DVT:  No.  Anticoagulants:  None.  Other:   DUPLEX EXAM:                CFV   SFV   PopV  PTV    GSV                R  L  R  L  R  L  R   L  R  L  Thrombosis    0  0  0     0     0      0  Spontaneous   +  +  +     +     +  Phasic        +  +  +     +     +  Augmentation  +  +  +     +     +  Compressible  +  +  +     +     +  Competent     +  +  +     +     +   Legend:  + - yes  o - no  p - partial  D - decreased   IMPRESSION:  1. No evidence of deep venous thrombosis noted in the right leg.     _____________________________  V. Charlena Cross, MD    Notified Dr. Herb Grays with results.   MG/MEDQ  D:  12/02/2009  T:  12/03/2009  Job:  [id number]

## 2011-03-27 NOTE — Discharge Summary (Signed)
[name], [name]               ACCOUNT NO.:  [id number]   MEDICAL RECORD NO.:  [id number]          PATIENT TYPE:  INP   LOCATION:  5503                         FACILITY:  [facility]   PHYSICIAN:  Anne Scott, MD     DATE OF BIRTH:  [birth year]   DATE OF ADMISSION:  12/23/2008  DATE OF DISCHARGE:  12/27/2008                               DISCHARGE SUMMARY   PRIMARY MEDICAL DOCTORS:  Dr. Herb Grays and Dr. Evelena Peat.   ONCOLOGIST:  Dr. Darrold Span.   PLASTIC SURGEON:  Dr. Shon Hough.   DISCHARGE DIAGNOSES:  1. Community-acquired pneumonia.  2. Anemia.  3. Hypotension - resolved.  4. Leukocytosis.  5. Breast cancer status post right mastectomy.  6. Hyperlipidemia.  7. Chronic obstructive pulmonary disease.  8. Anxiety.   DISCHARGE MEDICATIONS:  1. Lorazepam 0.5 mg p.o. daily.  2. Amitriptyline 150 mg p.o. q.h.s.  3. Zocor 20 mg p.o. daily.  4. Prevacid 15 mg p.o. daily.  5. Enteric-coated aspirin 81 mg p.o. daily.  6. Tamiflu 75 mg p.o. b.i.d.  7. Flonase 1 spray each nostril b.i.d.  8. Avelox 400 mg p.o. daily.  9. Robitussin 5 mL  p.o. q.6 hours p.r.n.  10.Tylenol 650 mg p.o. q.6 hours p.r.n.   PROCEDURES:  1. Chest x-ray on February 11.  Impression:      a.     Suspect interval right lumpectomy with axillary node       dissection.      b.     Right upper lobe infiltrate.   PERTINENT LABORATORIES:  Anemia panel with absolute reticulocytes 28.2.  Iron 16.  Total iron binding capacity 192, % saturation 8.  Vitamin B12  687.  Serum folate 6.4.  Ferritin  179.  CBC with hemoglobin 9.8,  hematocrit 25.9, white blood cells 9, platelets 297.  Blood cultures x2  are no growth to date.  Basic metabolic panel on February 13  unremarkable with BUN 9, creatinine 0.63.  TSH was 2.605.  Lipid panel:  Cholesterol 149, triglycerides 70, HDL 46, LDL 80, VLDL  14.   CONSULTATIONS:  Phone consultation with Dr. Shon Hough.   HOSPITAL COURSE AND PATIENT DISPOSITION:  [name] is a  very pleasant 70-  year-old Caucasian female patient with history of right breast cancer  status post mastectomy, hyperlipidemia, anxiety, previous smoker who  presented with worsening dyspnea and productive cough.  She was seen by  her primary MD, and prescribed Zithromax but continued to have  congestion in the head and these symptoms.  She was sent to the  emergency room where findings were suggestive of a right upper lobe  pneumonia.  The patient was then admitted for further management.   1. Community-acquired pneumonia.  The patient was admitted to the      hospital.  She was placed on droplet isolation.  Cultures were sent      off.  She was then placed on IV  Avelox and Tamiflu.  She initially      had right-sided chest pain which was found to be pleuritic in      nature.  However, patient has a breast expander on that side and      was concerned that this was contributing.  In any event examination      of her breasts did not reveal any new findings.  Her case was      discussed with Dr. Shon Hough by phone who agreed that  her chest      pain was probably secondary to her pneumonic process rather than      from the breast expander.  The patient also had some low blood      pressures, and was hydrated with IV fluids.  With these measures      the patient progressively did well.  She has been afebrile with      minimal dry cough She has no dyspnea and no further chest pains.      Her lung findings are clear.  Her leukocytosis has resolved.  The      patient will be sent home to complete a total 1 week course of      Avelox and 5 days' course of Tamiflu.  She has been advised to use      loop  face mask around family members until cough is completely      resolved.  She is to follow with her primary MD in a week's time.      Also recommend repeating chest x-ray in the next couple of weeks to      ensure resolution of findings. The patient complains of head      congestion.  She is  to continue using her Flonase and has been      advised regarding p.r.n. over the counter decongestants such as      Claritin.  2. Anemia which seems to be of chronic disease, but is stable.      Outpatient followup with Dr. Darrold Span.  3. Hypotension - resolved.  The patient has had low normal blood      pressures according to husband, systolics between 100 and 120 mmHg.  4. Leukocytosis - resolved.  5. Breast cancer - status post right mastectomy.  Follow up with Dr.      Darrold Span and Dr. Shon Hough.     Time spent in coordinating discharge 25 minutes.      Anne Scott, MD  Electronically Signed     AH/MEDQ  D:  12/27/2008  T:  12/27/2008  Job:  [id number]   cc:   Tammy R. Collins [place], M.D.  Evelena Peat, M.D.  Yaakov Guthrie. Shon Hough, M.D.  Lennis P. Darrold Span, M.D.

## 2011-03-27 NOTE — Discharge Summary (Signed)
[name], Anne [name]               ACCOUNT NO.:  [id number]   MEDICAL RECORD NO.:  [id number]          PATIENT TYPE:  INP   LOCATION:  1523                         FACILITY:  [facility]   PHYSICIAN:  Juanetta Gosling, MDDATE OF BIRTH:  [birth year]   DATE OF ADMISSION:  04/08/2009  DATE OF DISCHARGE:  04/11/2009                               DISCHARGE SUMMARY   ADMISSION DIAGNOSES:  1. Acute cholecystitis.  2. Chronic obstructive pulmonary disorder.  3. Anxiety disorder.  4. History of right breast cancer.   DISCHARGE DIAGNOSES:  1. Acute cholecystitis.  2. Chronic obstructive pulmonary disorder.  3. Anxiety disorder.  4. History of right breast cancer.  5. Status post laparoscopic converted to open cholecystectomy.   PERTINENT RADIOLOGIC STUDIES:  1. CT scan February 17, 2009 with a mildly dilated common bile duct and a      possible common bile duct stone.  2. MRCP May 11 with numerous gallstones and distal common bile duct      calculus.  3. ERCP May 18 that had a stone removed and a sphincterotomy      performed.   PERTINENT LABORATORY EVALUATION:  Showed a lipase and amylase that were  normal.  An alkaline phosphatase of 211, total bilirubin that was  normal, and a white blood cell count of 9.2 with a left shift.   HOSPITAL COURSE:  Anne Anne [name] is a 70 year old female who was seen in the  office by Dr. Corliss Skains, and diagnosed with acute cholecystitis on her  examination with a pulse of 102, a temperature of 98.1, and some  significant right upper quadrant pain.  I then evaluated her in the  [facility] emergency room where I agreed with Dr. Fatima Sanger plan.  She  had also had a HIDA scan on May 20 that showed a non-filling  gallbladder.  On her exam she had significant right upper quadrant  tenderness.  She and I discussed a laparoscopic with a possible open  cholecystectomy on Apr 08, 2009.  She was taken to the operating room  where she had a laparoscopic converted to an open  cholecystectomy with a  cholangiogram performed and also left a 19-French Blake drain in the  gallbladder fossa.  Her gallbladder was very inflamed, and the common  duct gallbladder junction was very enlarged and was unable to dissect it  satisfactory laparoscopically, so I did open her and extracted all the  stones and left a small cuff on gallbladder on the common bile duct and  then oversewed this with a drain placed immediately overlying that.  Immediately postoperatively she did well.  Her pain was controlled.  No  nausea or vomiting were noted.  The JP had only serosanguineous output  and no bile throughout the hospital course.  She had some right upper  elbow pain that appeared to subside over the time as well.  Her Foley  was removed on postoperative day #1.  Her total bilirubin was normal on  postoperative day #1 as well.  She continued to do well, and began  tolerating clears on postoperative day #2.  Her  wound remained clean  without any evidence of infection, and was changed over to oral pain  medications.  On postoperative day #3, she was tolerating a diet, taking  a lot of clears, and had return of  flatus and was doing well on the  oral pain medications.  She and her husband both agreed going home would  be a reasonable plan at that point.   MEDICATIONS UPON DISCHARGE:  Were her previous medications which  included amitriptyline and her lorazepam and Ativan was 0.5 mg q.h.s.  and p.r.n.  Amitriptyline is 150 mg q.h.s.   OTHER MEDICATIONS:  The other medications I discharged her home with:   1. Percocet as needed.  2. Colace 100 mg p.o. b.i.d.   FOLLOWUP:  With [facility] in 1 week as she went home with  her JP.  She had drain teaching prior to discharge as well.   Her pathology returned as an acute cholecystitis.      Juanetta Gosling, MD  Electronically Signed     MCW/MEDQ  D:  05/17/2009  T:  05/17/2009  Job:  [id number]

## 2011-03-27 NOTE — H&P (Signed)
NAME:  Anne [name], Anne [name]               ACCOUNT NO.:  [id number]   MEDICAL RECORD NO.:  [id number]          PATIENT TYPE:  INP   LOCATION:  5503                         FACILITY:  [facility]   PHYSICIAN:  Eduard Clos, MDDATE OF BIRTH:  [birth year]   DATE OF ADMISSION:  12/23/2008  DATE OF DISCHARGE:                              HISTORY & PHYSICAL   PRIMARY CARE PHYSICIAN:  Tammy R. Collins Scotland, M.D.   CHIEF COMPLAINT:  Shortness of breath and cough.   HISTORY OF THE PRESENT ILLNESS:  This is a 70 year old female with a  known history of CA of the breast status post right-sided mastectomy 2  years ago, hyperlipidemia, anxiety disorder and previous history of  cigarette smoking, but quit 2 years ago, who presents with complaints of  increasing shortness of breath, cough and expectoration.  The patient  initially developed these symptoms 4 days ago.  She went to see her  primary care physician who prescribed Zithromax; however, despite this  the patient had increasing tension of her face, cough with expectoration  and shortness of breath, with shortness of breath being present at rest.  In the ER the patient had a chest x-ray, which showed features of  pneumonia and the patient is admitted for further management.  Presently  the patient is not in acute.  She denies any __________ , dizziness,  loss of consciousness, nausea, vomiting, and abdominal pain.  She has  had some fever and 2-3 days ago; however, presently she does not have  any chills or fever.  She denies any diarrhea, discharge or dysuria.   PAST MEDICAL HISTORY:  1. History of CA of the breast status post right-sided mastectomy and      axillary node dissection.  2. History of hyperlipidemia.  3. COPD.   PAST SURGICAL HISTORY:  History of mastectomy.   MEDICATIONS:  The patient's medications prior to admission included:  1. Lorazepam 0.5 daily.  2. Amitriptyline 150 mg by mouth at bedtime.  3. Zocor 20 mg by mouth daily.  4.  Prevacid 15 by mouth daily.   ALLERGIES:  BENADRYL, WHICH CAUSES DISCOMFORT.   FAMILY HISTORY:  The family history reveals nothing contributory.   SOCIAL HISTORY:  The patient quit smoking 2 years ago and had smoked for  more than 30 years.  The patient is a social drinker.  Denies any drug  abuse.   REVIEW OF SYSTEMS:  The review of systems is as per the history of  presenting illness otherwise nothing else of significance.   PHYSICAL EXAMINATION:  GENERAL APPEARANCE:  The patient is examined at  the bedside. She is not in acute distress.  VITAL SIGNS:  Blood pressure is 124/78, pulse is 106/minute, temperature  98.1, respirations are 18, and O2 is sat 94% on room air.  HEENT:  Anicteric.  No pallor.  CHEST:  The chest shows bilateral air entry present.  No rhonchi.  No  crepitations.  HEART:  S1 and S2 are heard.  ABDOMEN:  The abdomen is soft and nontender.  Bowel sounds are present.  NEUROLOGIC EXAMINATION:  The patient is  alert and oriented to time,  place and person.  EXTREMITIES:  The patient moves all four extremities well.  Peripheral  pulses are felt.  No edema.   LABORATORY DATA:  Chest x-ray shows right mastectomy with axillary node  dissection and right upper lobe infiltrates.  CBC; WBC 11.4, hemoglobin  7.7, hematocrit 33.3, platelets 247,000, and neutrophils  83%.  Basic  metabolic panel; sodium 138, potassium 3.7, chloride 98, carbon dioxide  30, glucose 93, BUN 16 and creatinine 0.7.  Calcium 9.3.  Blood cultures  are pending.   ASSESSMENT:  1. Pneumonia,  2. Hyperlipidemia.  3. Anxiety disorder.  4. History of carcinoma of the breast status post right-sided      mastectomy.   PLAN:  1. We will admit the patient to telemetry.  2. We will place the patient on Avelox and Tamiflu.  3. We will place on droplet  precautions.  4. We will follow blood cultures.  5. If the patient's condition improves we will change her to by mouth      Avelox and discharge ti  home.  6. For recommendations as her condition evolves.      Eduard Clos, MD  Electronically Signed     ANK/MEDQ  D:  12/23/2008  T:  12/24/2008  Job:  [id number]   cc:   Tammy R. Collins Scotland, M.D.

## 2011-03-27 NOTE — Op Note (Signed)
NAME:  Anne [name], Anne [name]               ACCOUNT NO.:  [id number]   MEDICAL RECORD NO.:  [id number]          PATIENT TYPE:  INP   LOCATION:  5734                         FACILITY:  [facility]   PHYSICIAN:  Currie Paris, M.D.DATE OF BIRTH:  [birth year]   DATE OF PROCEDURE:  06/06/2007  DATE OF DISCHARGE:                               OPERATIVE REPORT   OFFICE MEDICAL RECORD NUMBER:  [id number]   PREOPERATIVE DIAGNOSIS:  Recurrent right breast cancer, upper outer  quadrant.   POSTOPERATIVE DIAGNOSIS:  Recurrent right breast cancer, upper outer  quadrant.   OPERATION:  Right total mastectomy with blue dye injection, attempted  sentinel lymph node biopsy and axillary dissection.   SURGEON:  Currie Paris, M.D.   ASSISTANT:  Leonie Man, M.D.   ANESTHESIA:  General endotracheal.   CLINICAL HISTORY:  Anne [name] is a 70 year old lady who has presented with  a right breast cancer that is recurrent and looks to be in the same site  as her primary, which was many years ago and was in the upper outer  quadrant of the right breast.  A PET scan showed no evidence of  metastatic disease.  After lengthy discussion with the patient, she  elected to have a mastectomy with reconstruction with a reduction on the  opposite side.  We told her we would attempt to do a sentinel node  biopsy, but since she has already had a sentinel node biopsy, one of  which had been positive previously and treated with radiation, I was not  certain whether we would be able to find a sentinel node and that we  would need to take nodal tissue out for evaluation, especially if any of  it felt abnormal.  She had no further questions.   DESCRIPTION OF PROCEDURE:  The patient was seen in the holding area and  she had no further questions there.  The right breast was identified as  the operative side and initialed by me.  She had already had her  filtered sulfur colloid injected.   The patient was taken to the  operating room and after satisfactory  general anesthesia had been obtained, both breasts and upper abdomen  were prepped and draped as a sterile field.  A time-out occurred and I  injected 5 mL of methylene blue subcutaneously around the nipple-areolar  area.   I outlined an elliptical incision somewhat oblique from lower inner to  upper outer so I could encompass the prior upper outer quadrant scar.  I  took basically no skin medial to the nipple and only a narrow area  lateral, but to include the scar.  I raised the skin flap superiorly,  going to the clavicle and then out into the axilla and I carried this  one medially to the sternal edge.  Using the Neoprobe, I tried to  identify a hot area in the axilla and could not find any.  I looked for  blue dye and saw none.  At this point, after several minutes of trying,  I went ahead to complete the mastectomy with another look planned  at the  axilla with the breast retracted laterally.   The inferior skin incision was made and the flaps raised in the usual  fashion.  The breast was removed from medial to lateral.  The  clavipectoral fascia was opened and the axilla entered.  There was some  very firm nodal tissue present and I did not know if this represented  scarred-up tissue for radiation or disease.  We went ahead and stripped  out the axillary contents, which were really fairly scant, going from  medial to lateral, superior to inferior, taking care to stay away from  the vein and not to above it and to preserve and identify both the long  thoracic and thoracodorsal nerves.  The final lateral attachments to the  latissimus were divided the specimen handed off.  The area was irrigated  and checked for hemostasis and everything appeared to be dry.  I pinched  both the long thoracic and thoracodorsal nerves and they seemed to work  fine.  The skin flaps appeared healthy.   Dr. Shon Hough was then to come in and complete the  reconstruction with a  reduction on the opposite side.      Currie Paris, M.D.  Electronically Signed     CJS/MEDQ  D:  06/06/2007  T:  06/07/2007  Job:  [id number]

## 2011-03-27 NOTE — Op Note (Signed)
NAME:  Anne [name], Anne [name]               ACCOUNT NO.:  [id number]   MEDICAL RECORD NO.:  [id number]          PATIENT TYPE:  INP   LOCATION:  1523                         [facility]AC[facility]   PHYSICIAN:  Juanetta Gosling, MDDATE OF BIRTH:  [birth year]   DATE OF PROCEDURE:  04/08/2009  DATE OF DISCHARGE:  04/08/2009                               OPERATIVE REPORT   PREOPERATIVE DIAGNOSIS:  Acute cholecystitis.   POSTOPERATIVE DIAGNOSIS:  Acute cholecystitis.   PROCEDURE:  Laparoscopic cholecystectomy converted to open  cholecystectomy with a cholangiogram.   SURGEON:  Troy Sine. Dwain Sarna, M.D.   ASSISTANT:  Currie Paris, M.D.   ANESTHESIA:  General.   SPECIMENS:  Gallbladder and contents to pathology.   ESTIMATED BLOOD LOSS:  100 mL.   DRAINS:  90 [place] Blake drain to gallbladder fossa.   COMPLICATIONS:  None.   SUPERVISING ANESTHESIOLOGIST:  Hezzie Bump. Rose, M.D.   DISPOSITION:  To recovery room in stable condition.   INDICATIONS:  Anne [name] is a 70 year old female who was a former patient  of Dr. Tenna Child for a right breast cancer who has had a number of years  history of right upper quadrant pain.  She was identified as having an  elevated alkaline phosphatase and underwent a CT scan in early April  with a mildly dilated common bile duct and possible common bile duct  stone.  She underwent MRCP May 11 with numerous gallstones and a distal  common bile duct calculus.  This was followed by an ERCP by Dr. Stan Head on May 18 with the stone removed and a sphincterotomy performed.  Forty-eight hours ago she had worsening right upper quadrant pain and  back pain that persisted.  She underwent a HIDA scan that showed  nonfilling gallbladder.  She and I discussed laparoscopic with possible  open cholecystectomy for her acute cholecystitis.   PROCEDURE IN DETAIL:  After informed consent was obtained the patient  was taken to the operating room.  She was administered 1  gram of  intravenous cefoxitin.  Sequential compression devices were placed on  lower extremities prior to induction.  She was then placed under general  anesthesia without complication.  Her abdomen was then prepped and  draped in a standard sterile surgical fashion.  A surgical time-out was  then performed.  A 10 mm vertical incision was then made below her umbilicus.  Dissection  was carried out down to the level of her fascia.  This was incised  sharply and the peritoneum was entered bluntly.  A zero Vicryl  pursestring suture was placed in the fascia.  Hasson trocar was then  introduced and the abdomen insufflated to 15 mmHg pressure.  Three  further 5 mm ports were placed in the epigastrium and right side of the  abdomen after infiltration with local anesthetic under direct vision  without complication.  She was then placed in the reverse Trendelenburg  position.  The gallbladder was noted to be very adherent to the omentum  and the duodenum.  Blunt dissection was used to remove the gallbladder  from the duodenum.  The gallbladder was very difficult to retract, had a  very thick wall and was completely full of stones.  There was a lot of  friable tissue around there causing some bleeding with all of this  dissection as well.  The gallbladder was attempted to be aspirated with  no real results.  Eventually we were able to trace down to find what we  thought was the cystic duct/common duct junction.  This appeared to be a  very enlarged junction and we were unable to confidently separate the  cystic duct from the common bile duct.  We clearly saw the common bile  duct in the gallbladder but we were really unable to identify  comfortably this junction to continue the dissection.  Dr. Jamey Ripa and I  both felt that at this point it would be safer to perform an open  procedure on her as it was very difficult to identify her structures and  to retract her gallbladder at all.  We removed  all the laparoscopic instruments and made a Kocher incision  in the right upper quadrant.  Dissection was carried out down to the  level of her fascia.  This was incised.  Her rectus muscle was divided.  Her peritoneum was entered.  The Bookwalter retractor was then inserted  and packs were placed above the liver to bring the gallbladder down.  The gallbladder was identified and then was taken down in a top down  fashion with electrocautery and removed from the liver bed.  This was  very adherent to the liver bed and there were multiple areas of  hemorrhage from the liver bed that were controlled with electrocautery.  This was then dissected down to the level of the cystic duct/common duct  junction.  We were able to identify an obliterated cystic artery that  was tied and divided.  We then were able to identify where the very  large cystic duct met the common duct.  We dissected this free.  This  was still about, with the inflammation around it, 2 cm in diameter.  We  divided the gallbladder about a centimeter off of the common duct and  passed that off the table as a specimen.  We then extracted a stone that  was impacted at the cystic duct/common duct junction.  We then passed a  Baham catheter into the common duct and secured this with a ligature.  A  cholangiogram was then performed which showed no further stones apparent  with filling of the duodenum and filling of both hepatic radicals  showing that we were in the cystic duct.  Then we removed this  cholangiogram catheter.  We discussed placing  a T tube and attempted  actually to place one but we were unable to do this due to the small  size of our cystic duct ductotomy.  We then elected to just over sew her  gallbladder, the cuff of gallbladder at that point as opposed to  entering into her common duct.  A 3-0 Vicryl was then used to perform a  running two layered closure of the gallbladder and this was then tied on  itself upon  completion.  Irrigation was then performed.  Hemostasis was  observed to the liver.  There was no leakage of bile from the common  bile duct or the gallbladder.  We then placed a 19 [place] Blake drain to  the gallbladder fossa as well as near the repair of the  gallbladder/common duct junction  with suture.  We then closed the  peritoneum with a 2-0 Vicryl.  The fascia was then closed with #1 PDS.  Skin was then closed with staples and as were the one remaining  laparoscopic incision.  The drain was secured with a 2-0 nylon suture.  Sponge and needle counts were correct at the completion of this  operation.  Sterile dressings were placed.  A Foley catheter was placed  and then she was awakened in the operating room and transferred to the  recovery room in stable condition.      Juanetta Gosling, MD  Electronically Signed     MCW/MEDQ  D:  04/08/2009  T:  04/09/2009  Job:  [id number]   cc:   [name], MD,FACG  [facility]  [address] [place], [place] [address]

## 2011-03-27 NOTE — Op Note (Signed)
[name], Anne [name]               ACCOUNT NO.:  [id number]   MEDICAL RECORD NO.:  [id number]          PATIENT TYPE:  INP   LOCATION:  5734                         FACILITY:  [facility]   PHYSICIAN:  Yaakov Guthrie. [place], M.D.DATE OF BIRTH:  1941-05-02   DATE OF PROCEDURE:  06/06/2007  DATE OF DISCHARGE:                               OPERATIVE REPORT   This 70 year old lady has recurrent right breast cancer, and is now been  prepared for a right modified radical mastectomy.  The patient is to now  get a reconstruction today and used a tissue expansion.  She also has  increased ptosis about her left breast and will undergo a mastopexy for  symmetry.   ANESTHESIA:  General.   The patient came by my office yesterday to have drawings done for each  procedure, remarking the nipple areolar complexes out to 20 cm on the  left side for the mastopexy for lift.  After Dr. Jamey Ripa had done his  right mastectomy, I then came in using sterile technique.  We injected  the left breast with Xylocaine 0.14% with epinephrine 1:400,000  concentration, in a total of 200 mL.  The skin over the left drawing was  de-epithelialized with a #15 and #20 blades.  The lateral flaps were  freed up and the mastopexy had the shape of an anchor-type of inverted  T.  Flaps were freed laterally.  Hemostasis was maintained with the  Bovie unipolar coagulation.  Next, the 12 o'clock position of the nipple  areolar complex then was brought up to the 12 o'clock position for the  new lift of the periareolar area and secured with 2-0 Monocryl sutures.  The flaps were then closed in vertically with multiple sutures of 2-0  Monocryl times 2 layers, then running subcuticular sutures of 3-0 and 5-  0 Monocryl.  The wounds were cleansed and half the Steri-Strips were  applied.   Next, attention was drawn to the right mastectomy site.  A flap was made  between the pectoralis major and the serratus anterior musculature.  The  flap  was then freed medially all the way medial to the sternum,  superiorly, inferiorly over the fifth rib and laterally.  After proper  hemostasis, the Mentor Spectrum implant tissue expander was placed in  the subpectoral pocket, 200 mL of saline was injected to give [place] a head  start.  The muscles then reclosed with 2-0 Monocryl.  The port was then  closed with 3-0 Monocryl and the lateral aspect of the wound.  Next, the  skin edges were reapproximated with 2-0 Monocryl times 2 layers in the  deep subcutaneous plane and then running subcuticular stitches of 3-0  Monocryl.  Half inch Steri-Strips and soft dressings were applied.  The  wounds were drained with #10 fully fluted Blake drain, which was placed  in the depth of the wound and brought out through the lateral portion of  the incision and secured with 3-0 Prolene.  The wounds were cleansed.  Sterile dressing applied to all of the areas included Xeroform, 4 x 4's,  ABDs, Hypafix tape.  She was then taken to recovery in excellent  condition.   ESTIMATED BLOOD LOSS:  Less than 150 mL.   COMPLICATIONS:  None.     Yaakov Guthrie. Shon Hough, M.D.  Electronically Signed    GLT/MEDQ  D:  06/06/2007  T:  06/06/2007  Job:  [id number]

## 2011-03-29 ENCOUNTER — Encounter: Payer: Self-pay | Admitting: Family Medicine

## 2011-03-29 ENCOUNTER — Ambulatory Visit (INDEPENDENT_AMBULATORY_CARE_PROVIDER_SITE_OTHER): Payer: 59 | Admitting: Family Medicine

## 2011-03-29 DIAGNOSIS — T148XXA Other injury of unspecified body region, initial encounter: Secondary | ICD-10-CM

## 2011-03-29 DIAGNOSIS — F1021 Alcohol dependence, in remission: Secondary | ICD-10-CM | POA: Insufficient documentation

## 2011-03-29 DIAGNOSIS — R000 Tachycardia, unspecified: Secondary | ICD-10-CM

## 2011-03-29 DIAGNOSIS — F1010 Alcohol abuse, uncomplicated: Secondary | ICD-10-CM

## 2011-03-29 HISTORY — DX: Alcohol dependence, in remission: F10.21

## 2011-03-29 LAB — CBC WITH DIFFERENTIAL/PLATELET
Basophils Relative: 0.9 % (ref 0.0–3.0)
Eosinophils Relative: 1.3 % (ref 0.0–5.0)
Lymphocytes Relative: 24.8 % (ref 12.0–46.0)
Monocytes Absolute: 0.4 10*3/uL (ref 0.1–1.0)
Neutrophils Relative %: 66.2 % (ref 43.0–77.0)
Platelets: 235 10*3/uL (ref 150.0–400.0)
RBC: 3.73 Mil/uL — ABNORMAL LOW (ref 3.87–5.11)
WBC: 5.2 10*3/uL (ref 4.5–10.5)

## 2011-03-29 LAB — PROTIME-INR: INR: 1 ratio (ref 0.8–1.0)

## 2011-03-29 MED ORDER — NEBIVOLOL HCL 5 MG PO TABS: 5.0000 mg | ORAL_TABLET | Freq: Every day | ORAL | Status: DC

## 2011-03-29 MED ORDER — LORAZEPAM 1 MG PO TABS: ORAL_TABLET | ORAL | Status: DC

## 2011-03-29 MED ORDER — FOLIC ACID 1 MG PO TABS: 1.0000 mg | ORAL_TABLET | Freq: Every day | ORAL | Status: AC

## 2011-03-29 MED ORDER — THIAMINE HCL 100 MG PO TABS: 100.0000 mg | ORAL_TABLET | Freq: Every day | ORAL | Status: AC

## 2011-03-29 NOTE — Patient Instructions (Signed)
Alcohol Withdrawal Anytime drug use is interfering with normal living activities it has become abuse. This includes problems with family and friends. Psychological dependence has developed when your mind tells you that the drug is needed. This is usually followed by physical dependence when a continuing increase of drugs are required to get the same feeling or "high". This is known as addiction or chemical dependency. A person's risk is much higher if there is a history of chemical dependency in the family. Mild Withdrawal Following Stopping Alcohol, When Addiction or Chemical Dependency Has Developed When a person has developed tolerance to alcohol, any sudden stopping of alcohol can cause uncomfortable physical symptoms (problems). Most of the time these are mild and consist of tremors in the hands and increases in heart rate, breathing, and temperature. Sometimes these symptoms are associated with anxiety, panic attacks, and bad dreams. There may also be stomach upset. Normal sleep patterns are often interrupted with periods of insomnia (inability to sleep). This may last for 6 months. Because of this discomfort, many people choose to continue drinking to get rid of this discomfort and to try to feel normal. Severe Withdrawal with Decreased or No Alcohol Intake, When Addiction or Chemical Dependency Has Developed About five percent of alcoholics will develop signs of severe withdrawal when they stop using alcohol. One sign of this is development of generalized seizures (convulsions). Other signs of this are severe agitation and confusion. This may be associated with delusions and hallucinations (believing in things which are not real or seeing things which are not really there). Vitamin deficiencies are usually present if alcohol intake has been long term. Treatment for this most often requires hospitalization and close observation. Addiction can only be helped by stopping use of all chemicals. This is hard  but may save your life. With continual alcohol use possible outcomes are usually loss of self respect and esteem, violence, and death. It can eventually lead to prison if the use of alcohol has caused the death of another. Addiction cannot be cured but it can be stopped. This often requires outside help and the care of professionals. Treatment centers are listed in the yellow pages under Cocaine, Narcotics, and Alcoholics anonymous. Most hospitals and clinics can refer you to a specialized care center. It is not necessary for you to go through the uncomfortable symptoms of withdrawal. Your caregiver can provide you with medications that will help you through this difficult period. Try to avoid situations, friends, or drugs that made it possible for you to keep using alcohol in the past. Learn how to say no! It takes a long period of time to overcome addictions to all drugs, including alcohol. There may be many times when you feel as though you want a drink. All this amounts to is a want. After getting rid of the physical addiction and withdrawal, you will have a lessening of the craving which tells you that you need alcohol to feel normal. Call your caregiver if more support is needed. Learn who to talk to in your family and among your friends so that during these periods you can receive outside help. AA (Alcoholics Anonymous) has helped many people over the years. To get further help, contact AA or call your caregiver, counselor or minister. AL-ANON and ALA-TEEN are support groups for friends and family members of an alcoholic. The people who love and care for an alcoholic often need help, too. For information about these organizations, check your phone directory or call a local alcoholism  treatment center.  SEEK IMMEDIATE MEDICAL CARE IF:  You have a seizure.   You develop a high fever.   You experience uncontrolled vomiting or you vomit up blood. This may be bright red or look like black coffee grounds.    You have blood in the stool. This may be bright red or appear as a black tarry, foul smelling stool.   You become light headed or faint. DO NOT DRIVE IF YOU FEEL THIS WAY. Have someone else drive you or call 161 for help.   You become more agitated or confused.   You develop uncontrolled anxiety.   You begin to have hallucinations (see things that are not really there).  Your caregiver has determined that you completely understand your medical condition, and that your mental state is back to normal. You understand that you have been treated for alcohol withdrawal, have agreed not to drink any alcohol for a minimum of 1 day, will not operate a car or other machinery for 24 hours, and have had an opportunity to ask any questions about your condition. Document Released: 08/08/2005 Document Re-Released: 01/23/2010 [facility] Patient Information 2011 [place], [place].Sprains Your caregiver has diagnosed you as suffering from a sprain. Sprains are painful injuries to the joints as a result of partial or complete tearing of ligaments. HOME CARE INSTRUCTIONS  For the first 24 hours, keep the injured limb raised on 2 pillows while lying down.   Apply ice bags about every 2 hours for 20 to 30 minutes, while awake, to the injured area for the first 24 hours. Then apply as directed by your caregiver. Place the ice in a plastic bag with a towel around it to prevent frostbite to the skin.   Only take over-the-counter or prescription medicines for pain, discomfort, or fever as directed by your caregiver.   If an ace bandage (stretchy, elastic wrapping bandage) has been applied today, remove and reapply every 3 to 4 hours. Apply firm enough to keep swelling down. Do not apply tightly. Watch fingers or toes for swelling, bluish discoloration, coldness, numbness, or excessive pain. If any of these symptoms (problems) occur, remove the ace bandage and reapply more loosely. Contact your caregiver or return to this  location if these symptoms persist.  Persistent pain and inability to use the injured area for more than 2 to 3 days are warning signs. See a caregiver for a follow-up visit as soon as possible. A hairline fracture (this is the same as a broken bone) may not show on x-rays. Persistent pain and swelling indicate that further evaluation, use of crutches, and/or more x-rays are needed. X-rays may sometimes not show a small fracture until a week or ten days later. Make a follow-up appointment with your own caregiver or to whom we have referred you. A radiologist (a specialist in reading x-rays) will re-read your x-rays. Make sure you know how to obtain your x-ray results. Do not assume everything is normal if you do not hear from [place]. SEEK IMMEDIATE MEDICAL ATTENTION IF:  You develop severe pain or more swelling.   The pain is not controlled with medicine.   Your skin or nails below the injury turn blue or grey or feel cold or numb.  Document Released: 10/26/2000 Document Re-Released: 08/07/2008 [facility] Patient Information 2011 [place], [place].   Try applying Aspercreme to left arm for pain twice daily

## 2011-03-30 ENCOUNTER — Ambulatory Visit (INDEPENDENT_AMBULATORY_CARE_PROVIDER_SITE_OTHER)
Admission: RE | Admit: 2011-03-30 | Discharge: 2011-03-30 | Disposition: A | Discharge status: Home / Self Care | Payer: 59 | Source: Ambulatory Visit | Attending: Family Medicine | Admitting: Family Medicine

## 2011-03-30 DIAGNOSIS — T148XXA Other injury of unspecified body region, initial encounter: Secondary | ICD-10-CM

## 2011-03-30 NOTE — H&P (Signed)
[facility]  Patient:    Anne [name], Anne [name]                        MRN: [id number] Adm. Date:  07/17/00 Attending:  Ottie Glazier P. Darrold Span, M.D. CC:         Teena Irani. Arlyce Dice, M.D.  Currie Paris, M.D.  Jackelyn Knife, M.D.   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  Admitted for 23-hour observation for continuous IV fluids and IV antiemetics following the fourth cycle of chemotherapy due to intolerable nausea, vomiting, and symptomatic orthostatic hypertension with her previous treatments.  The situation has been complicated by psychiatric problems exacerbated by the stress of her diagnosis and the treatments.  She has a history of depression, anorexia, and sleep disturbance for years.  Since 1981, she has been on large doses of amitriptyline.  This recently decreased gradually from 200 down to 150 mg at h.s. because of the orthostatic hypotension.  She has recently seen started also on Wellbutrin from Dr. Teena Irani. [place] office and seen a counselor with [facility] on July 16, 2000.  No changes were made to her medications then and she is for return return visit to the counselor in two weeks.  REVIEW OF SYSTEMS:  Slightly decreased appetite for the past two days, though she did eat breakfast.  Has not been drinking her Gatorade.  She has been exercising quite a bit.  She has been sleeping without problems.  No symptoms of infection.  Just some minimal orthostatic hypotensive symptoms that had previously been much more severe.  ALLERGIES: Intolerance to CODEINE and CIPROFLOXACIN.  She gets headaches with ZOFRAN.  SOCIAL HISTORY:  Primary care is Teena Irani. Arlyce Dice, M.D.  Ongoing tobacco.  She is married and lives [place]  FAMILY HISTORY:  Positive for breast cancer in two maternal aunts.  PHYSICAL EXAMINATION:  Weight 130 pounds.  VITAL SIGNS:  Temperature 97.7 degrees, respirations not labored, blood pressure 100/80, heart rate 80 and regular.  HEENT:  Pupils equal and reactive.  Oral mucosa and posterior pharynx clear.  NECK:  Supple.  No palpable peripheral adenopathy.  No JVD.  LUNGS:  Clear to the bases.  HEART:  Regular rate and rhythm.  BREASTS:  Exam has unremarkable for recurrence around the lumpectomy.  ABDOMEN:  Soft and nontender without appreciable organomegaly.  Normally active bowel sounds.  EXTREMITIES:  Lower extremities with no edema.  NEUROLOGIC:  Nonfocal exam.  She is alert, talkative, and appropriate today.  LABORATORY DATA:  Today the white count was 5700 with an ANC of 3.8, hemoglobin 12.6, and platelets 259.  IMPRESSION AND PLAN: 1. T1, N1 right breast carcinoma in a patient who has had intractable nausea    and vomiting and symptomatic orthostatic hypotension exacerbated by    dehydration, particularly with the last treatment.  Will treat as an    outpatient at [facility] today and then admit her for 23-hour    observation for continuous IV fluid and IV antiemetics. 2. Psychiatric problems with anxiety and sleep and appetite disturbance on    amitriptyline at very large doses for the past 20 years and Wellbutrin    recently added. DD:  07/17/00 TD:  07/17/00 Job: [id number]

## 2011-03-30 NOTE — Op Note (Signed)
[facility]  Patient:    Anne [name], Anne [name]                      MRN: [id number] Proc. Date: 02/16/00 Adm. Date:  [id number] Attending:  Charlton Haws                           Operative Report  OFFICE NUMBER:  [id number]  PREOPERATIVE DIAGNOSIS:  Carcinoma of right breast.  POSTOPERATIVE DIAGNOSIS:  Carcinoma of right breast.  PROCEDURE:  Right sentinel node dissection with blue dye injection and lymphatic mapping.  SURGEON:  Currie Paris, M.D.  ANESTHESIA:  General endotracheal anesthesia.  INDICATIONS:  This patient is status post excision of her breast cancer, right breast upper outer quadrant.  For staging purposes, node dissection was indicated.  DESCRIPTION OF PROCEDURE:  The patient was brought to the operating room having had her radionucleatide injected.  The Neoprobe was used and identified an area in he axilla.  There were no areas in the sternal areas noted to be hot.  Blue dye was injected circumareolarly and subdermally around the lumpectomy site and then in the breast tissue around the lumpectomy site.  This was massaged in for about five minutes.  At this point a transverse incision was made in the lower area of the axilla directly over the area that was identified as hot.  I divided some of the subcutaneous tissues and almost immediately found a blue lymphatic.  I traced that a little bit deeper and superior and this was the direction that the Neoprobe indicated the node to be.  After very brief dissection, I found the blue dye entering the node which had already begun to turn blue and grasped this with Babcock.  There was actually a cluster of three nodes here together, two of which had already turned blue and these were excised as a single specimen.  The third of the three nodes had not turned blue and had not become hot, but was closely attached to the first two which had counts in the range of 2000  with background in the range of about 20 to 30.  With removal of these, backround counts in the axilla dropped down to level all through the axillary areas.  There were no other hot areas noted.  Central dissection revealed no other blue nodes.  The breast was closed with some 3-0 Vicryl followed by 4-0 Monocryl subcuticular plus Steri-Strips.  Pathology report of these nodes were negative for touchpreps for metastatic disease. DD:  02/16/00 TD:  02/16/00 Job: [id number]

## 2011-04-01 NOTE — Progress Notes (Signed)
Anne [name] [id number] 08/09/1941 04/01/2011      Progress Note-Follow Up  Subjective  Chief Complaint  Chief Complaint  Patient presents with  . Hand Injury    bruise on left hand X7 days (had a blister on it when spot first appeared    HPI  Patient is a 70 year old Caucasian female in today with her husband initially complaining of a bruise at the base of her thumb on the left hand which he says it's been present for 7 days. She denies any obvious injury or fall but says that there is a slightly painful and slowly improving. She also complains of arm pain going from her elbow down her arm at times. She then proceeds to discuss that she's been drinking heavily about following a day. This is a new problem and she is aware that she needs to cut back. She denies she's ever had the same problem she denies any chest pain, palpitations, shortness of breath, fevers, chills, recent illness, GI or GU complaints. She acknowledges being under a great deal of stress and to using alcohol to help her manage her stress. She cannot use depression anhedonia has become an ongoing issue for her.  Past Medical History  Diagnosis Date  . Anemia   . Anxiety   . COPD (chronic obstructive pulmonary disease)   . Hyperlipidemia   . Pneumonia 2-10    pleurisy  . Arthritis   . Depression   . GERD (gastroesophageal reflux disease)     improved s/p cholecystectomy and esophagus dilatation  . History of chicken pox   . History of shingles     2 episodes  . History of measles   . Osteopenia 03/14/2011  . 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 Pure hypercholesterolemia 10/17/2010  . Constipation 03/15/2011  . 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 GERD 09/29/2009  . ESOPHAGEAL STRICTURE 03/29/2009  . CONSTIPATION 09/29/2009  . CAROTID ARTERY STENOSIS 01/13/2010  . ANXIETY 09/29/2009  . ALKALINE PHOSPHATASE, ELEVATED 03/15/2009    Past Surgical History  Procedure Date  . Anterior  cruciate ligament repair 08, 09, 10  . Ercp, cbd stone extraction 2010  . Cholecystectomy 2010  . Egd/ dili 2010  . Breast surgery 2001, 2008    lumpectomy (01), Mastectomy modified radical (08), breast reconstruction, CA lesions excised lateral abd wall 2010    Family History  Problem Relation Age of Onset  . Heart disease Father   . Cancer Father     lung cancer/smoker  . Cirrhosis Sister     Primary Biliary  . Cancer Other     Breast, ovarian, pancreatic  . Stroke Maternal Grandmother   . Alcohol abuse Maternal Grandfather   . Heart disease Paternal Grandfather   . Anxiety disorder Sister   . Osteoporosis Sister   . Cancer Sister     skin  . Osteoporosis Sister   . Other Mother     tic deleauroux    History   Social History  . Marital Status: Married    Spouse Name: N/A    Number of Children: N/A  . Years of Education: N/A   Occupational History  . Not on file.   Social History Main Topics  . Smoking status: Former Smoker    Quit date: 05/22/2007  . Smokeless tobacco: Never Used  . Alcohol Use: Yes     1 bottle of wine a week  . Drug Use: No  .  Sexually Active: Yes -- Female partner(s)   Other Topics Concern  . Not on file   Social History Narrative  . No narrative on file    Current Outpatient Prescriptions on File Prior to Visit  Medication Sig Dispense Refill  . amitriptyline (ELAVIL) 150 MG tablet Take 150 mg by mouth at bedtime.        [name] anastrozole (ARIMIDEX) 1 MG tablet Take 1 mg by mouth daily.        [name] aspirin 81 MG tablet Take 81 mg by mouth daily.        [name] atorvastatin (LIPITOR) 40 MG tablet Take 40 mg by mouth daily.       . beta carotene 65784 UNIT capsule Take 25,000 Units by mouth daily.        . Calcium Citrate (CITRACAL PO) Take 500 mg by mouth daily.        . Cholecalciferol (VITAMIN D3) 1000 UNITS CAPS Take 2 tablets by mouth daily.        . diphenoxylate-atropine (LOMOTIL) 2.5-0.025 MG per tablet Take 1 tablet by mouth as needed.         . NON FORMULARY Zinc oxide 50 mg       . omeprazole (PRILOSEC) 20 MG capsule Take 20 mg by mouth 2 (two) times daily.        . ondansetron (ZOFRAN) 8 MG tablet Take by mouth 2 (two) times daily.        [name] pyridOXINE (VITAMIN B-6) 50 MG tablet Take 50 mg by mouth 3 (three) times daily.        . vitamin B-12 (CYANOCOBALAMIN) 1000 MCG tablet Take 1,000 mcg by mouth daily.          Allergies  Allergen Reactions  . Codeine   . Diphenhydramine Hcl   . Morphine     Review of Systems  Review of Systems  Constitutional: Negative for fever and malaise/fatigue.  HENT: Negative for congestion.   Eyes: Negative for discharge.  Respiratory: Negative for shortness of breath.   Cardiovascular: Negative for chest pain, palpitations and leg swelling.  Gastrointestinal: Negative for nausea, abdominal pain and diarrhea.  Genitourinary: Negative for dysuria.  Musculoskeletal: Negative for falls.  Skin: Negative for rash.  Neurological: Negative for loss of consciousness and headaches.  Endo/Heme/Allergies: Negative for polydipsia. Bruises/bleeds easily.  Psychiatric/Behavioral: Positive for substance abuse. Negative for depression and suicidal ideas. The patient is not nervous/anxious and does not have insomnia.     Objective  BP 115/77  Pulse 96  Temp(Src) 98 F (36.7 C) (Oral)  Ht 5\' 6"  (1.676 m)  Wt 126 lb 1.9 oz (57.208 kg)  BMI 20.36 kg/m2  SpO2 97%  Physical Exam  Physical Exam  Constitutional: She is oriented to person, place, and time and well-developed, well-nourished, and in no distress. No distress.  HENT:  Head: Normocephalic and atraumatic.  Eyes: Conjunctivae are normal.  Neck: Neck supple. No thyromegaly present.  Cardiovascular: Normal rate, regular rhythm and normal heart sounds.   No murmur heard. Pulmonary/Chest: Effort normal and breath sounds normal. She has no wheezes.  Abdominal: She exhibits no distension and no mass.  Musculoskeletal: She exhibits no  edema.       Yellowing bruise over left hand, at base of thumb  Lymphadenopathy:    She has no cervical adenopathy.  Neurological: She is alert and oriented to person, place, and time.  Skin: Skin is warm and dry. No rash noted. She is not diaphoretic.  Psychiatric: Memory, affect and judgment normal.     Lab Results  Component Value Date   WBC 5.2 03/29/2011   HGB 12.1 03/29/2011   HCT 35.2* 03/29/2011   MCV 94.5 03/29/2011   PLT 235.0 03/29/2011   Lab Results  Component Value Date   CREATININE 0.69 03/07/2011   CREATININE 0.69 03/07/2011   BUN 22 03/07/2011   BUN 22 03/07/2011   NA 140 03/07/2011   NA 140 03/07/2011   K 3.9 03/07/2011   K 3.9 03/07/2011   CL 104 03/07/2011   CL 104 03/07/2011   CO2 28 03/07/2011   CO2 28 03/07/2011   Lab Results  Component Value Date   ALT 27 03/07/2011   ALT 27 03/07/2011   AST 25 03/20/2011   ALKPHOS 113 03/07/2011   ALKPHOS 113 03/07/2011   BILITOT 0.5 03/07/2011   BILITOT 0.5 03/07/2011   Lab Results  Component Value Date   CHOL 195 03/20/2011   Lab Results  Component Value Date   HDL 76.00 03/20/2011   Lab Results  Component Value Date   LDLCALC 94 03/20/2011   Lab Results  Component Value Date   TRIG 123.0 03/20/2011   Lab Results  Component Value Date   CHOLHDL 3 03/20/2011     Assessment & Plan  Alcohol abuse Patient acknowledges her drinking has gotten away from her recently. Her husband is with her and concurs that her she is drinking about a bottle of wine each day. They deny that she has ever drank this much before. She agrees she needs to stop, so we will have her cut down by 1/2 and start Ativan tid, then she will continue to drop her intake every couple of days. We will see her next week in follow up. She will seek immediate medical care if she has any concerning symptoms  Tachycardia Will start her on Bystolic 5mg  1/2 tab po daily and we will reeval next week. If her pulse begins to run over 100 we will increase to a full tab.    Bruising She has bruising over left hand but does not remember falling. We will xray her hand and elbow where she is uncomfortable and aeait results

## 2011-04-01 NOTE — Assessment & Plan Note (Signed)
Will start her on Bystolic 5mg  1/2 tab po daily and we will reeval next week. If her pulse begins to run over 100 we will increase to a full tab.

## 2011-04-01 NOTE — Assessment & Plan Note (Signed)
She has bruising over left hand but does not remember falling. We will xray her hand and elbow where she is uncomfortable and aeait results

## 2011-04-01 NOTE — Assessment & Plan Note (Addendum)
Patient acknowledges her drinking has gotten away from her recently. Her husband is with her and concurs that her she is drinking about a bottle of wine each day. They deny that she has ever drank this much before. She agrees she needs to stop, so we will have her cut down by 1/2 and start Ativan tid, then she will continue to drop her intake every couple of days. We will see her next week in follow up. She will seek immediate medical care if she has any concerning symptoms

## 2011-04-02 ENCOUNTER — Other Ambulatory Visit: Payer: Self-pay

## 2011-04-02 DIAGNOSIS — F1010 Alcohol abuse, uncomplicated: Secondary | ICD-10-CM

## 2011-04-02 MED ORDER — LORAZEPAM 1 MG PO TABS: ORAL_TABLET | ORAL | Status: DC

## 2011-04-04 ENCOUNTER — Telehealth: Payer: Self-pay | Admitting: Family Medicine

## 2011-04-04 NOTE — Telephone Encounter (Signed)
Please advise 

## 2011-04-04 NOTE — Telephone Encounter (Signed)
Patient informed and transferred call to Diane to make appt for pt on 04-04-10

## 2011-04-04 NOTE — Telephone Encounter (Signed)
Patient said she is still is craving alcohol feels like it may be getting worse, she feels like the ativan in not helping

## 2011-04-04 NOTE — Telephone Encounter (Signed)
Patient needs to come in for [place] to check her vital signs and reevaluate her condition before we change these medications

## 2011-04-05 ENCOUNTER — Encounter: Payer: Self-pay | Admitting: Family Medicine

## 2011-04-05 ENCOUNTER — Ambulatory Visit (INDEPENDENT_AMBULATORY_CARE_PROVIDER_SITE_OTHER): Payer: 59 | Admitting: Family Medicine

## 2011-04-05 VITALS — BP 112/75 | HR 95 | Temp 98.0°F | Ht 66.0 in | Wt 126.1 lb

## 2011-04-05 DIAGNOSIS — T148XXA Other injury of unspecified body region, initial encounter: Secondary | ICD-10-CM

## 2011-04-05 DIAGNOSIS — F1010 Alcohol abuse, uncomplicated: Secondary | ICD-10-CM

## 2011-04-05 DIAGNOSIS — R000 Tachycardia, unspecified: Secondary | ICD-10-CM

## 2011-04-05 DIAGNOSIS — E7800 Pure hypercholesterolemia, unspecified: Secondary | ICD-10-CM

## 2011-04-05 MED ORDER — LORAZEPAM 1 MG PO TABS: ORAL_TABLET | ORAL | Status: DC

## 2011-04-05 MED ORDER — NEBIVOLOL HCL 5 MG PO TABS: ORAL_TABLET | ORAL | Status: DC

## 2011-04-05 NOTE — Progress Notes (Signed)
Anne [name] [id number] [birth year] 04/05/2011      Progress Note-Follow Up  Subjective  Chief Complaint  Chief Complaint  Patient presents with  . Follow-up    pt is still drinking doesnt' think ativan is helping    HPI  Patient in any 70 year old Caucasian female in today accompanied by her husband. They're here for followup on her substance abuse. She continues to drink close to a ball wine a day. After her last visit she did try cutting half bottle and became very irritable anxious and went back up to a bottle. She stated that level until yesterday when she tried to three quarters of a bottle that says she still felt anxious and uncomfortable. He did not start the lorazepam as instructed but instead used it much more sparingly. They're asked to use it as instructed one tab 3 times a day for the next week and then titrate back as tolerated but to only take this dose if she is cutting back on alcohol. She will report any concerning symptoms or seek immediate medical care if she has any further troubles but presently she is denying any chest pain, palpitations, fevers, chills, nausea, vomiting, diaphoresis, shortness of breath, abdominal pain, GI or GU complaints. Her left hand and left arm continued to improve and recent x-rays were negative and are the patient. She is taking an occasional adult for the discomfort and that has been helpful  Past Medical History  Diagnosis Date  . Anemia   . Anxiety   . COPD (chronic obstructive pulmonary disease)   . Hyperlipidemia   . Pneumonia 2-10    pleurisy  . Arthritis   . Depression   . GERD (gastroesophageal reflux disease)     improved s/p cholecystectomy and esophagus dilatation  . History of chicken pox   . History of shingles     2 episodes  . History of measles   . Osteopenia 03/14/2011  . 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 Pure hypercholesterolemia 10/17/2010  . Constipation 03/15/2011  . PERSONAL HX BREAST  CANCER 09/29/2009  . GERD 09/29/2009  . ESOPHAGEAL STRICTURE 03/29/2009  . CONSTIPATION 09/29/2009  . CAROTID ARTERY STENOSIS 01/13/2010  . ANXIETY 09/29/2009  . ALKALINE PHOSPHATASE, ELEVATED 03/15/2009    Past Surgical History  Procedure Date  . Anterior cruciate ligament repair 08, 09, 10  . Ercp, cbd stone extraction 2010  . Cholecystectomy 2010  . Egd/ dili 2010  . Breast surgery 2001, 2008    lumpectomy (01), Mastectomy modified radical (08), breast reconstruction, CA lesions excised lateral abd wall 2010    Family History  Problem Relation Age of Onset  . Heart disease Father   . Cancer Father     lung cancer/smoker  . Cirrhosis Sister     Primary Biliary  . Cancer Other     Breast, ovarian, pancreatic  . Stroke Maternal Grandmother   . Alcohol abuse Maternal Grandfather   . Heart disease Paternal Grandfather   . Anxiety disorder Sister   . Osteoporosis Sister   . Cancer Sister     skin  . Osteoporosis Sister   . Other Mother     tic deleauroux    History   Social History  . Marital Status: Married    Spouse Name: N/A    Number of Children: N/A  . Years of Education: N/A   Occupational History  . Not on file.   Social History Main Topics  .  Smoking status: Former Smoker    Quit date: 05/22/2007  . Smokeless tobacco: Never Used  . Alcohol Use: Yes     1 bottle of wine a week  . Drug Use: No  . Sexually Active: Yes -- Female partner(s)   Other Topics Concern  . Not on file   Social History Narrative  . No narrative on file    Current Outpatient Prescriptions on File Prior to Visit  Medication Sig Dispense Refill  . amitriptyline (ELAVIL) 150 MG tablet Take 150 mg by mouth at bedtime.        [name] anastrozole (ARIMIDEX) 1 MG tablet Take 1 mg by mouth daily.        [name] aspirin 81 MG tablet Take 81 mg by mouth daily.        [name] atorvastatin (LIPITOR) 40 MG tablet Take 40 mg by mouth daily.       . beta carotene 34742 UNIT capsule Take 25,000 Units by mouth  daily.        . Calcium Citrate (CITRACAL PO) Take 500 mg by mouth daily.        . Cholecalciferol (VITAMIN D3) 1000 UNITS CAPS Take 2 tablets by mouth daily.        . diphenoxylate-atropine (LOMOTIL) 2.5-0.025 MG per tablet Take 1 tablet by mouth as needed.        . folic acid (FOLVITE) 1 MG tablet Take 1 tablet (1 mg total) by mouth daily.  30 tablet  11  . NON FORMULARY Zinc oxide 50 mg       . omeprazole (PRILOSEC) 20 MG capsule Take 20 mg by mouth 2 (two) times daily.        . ondansetron (ZOFRAN) 8 MG tablet Take by mouth 2 (two) times daily.        [name] pyridOXINE (VITAMIN B-6) 50 MG tablet Take 50 mg by mouth 3 (three) times daily.        [name] thiamine 100 MG tablet Take 1 tablet (100 mg total) by mouth daily.  30 tablet  11  . vitamin B-12 (CYANOCOBALAMIN) 1000 MCG tablet Take 1,000 mcg by mouth daily.        [name] DISCONTD: LORazepam (ATIVAN) 1 MG tablet 1-2 tabs po tid prn anxiety, tremulousness, Take 1 tab 3 x daily for next week and the others only as needed as you quit alcohol  90 tablet  0  . DISCONTD: nebivolol (BYSTOLIC) 5 MG tablet Take 1 tablet (5 mg total) by mouth daily.  21 tablet  0    Allergies  Allergen Reactions  . Codeine   . Diphenhydramine Hcl   . Morphine     Review of Systems  Review of Systems  Constitutional: Negative for fever and malaise/fatigue.  HENT: Negative for congestion.   Eyes: Negative for discharge.  Respiratory: Negative for shortness of breath.   Cardiovascular: Negative for chest pain, palpitations and leg swelling.  Gastrointestinal: Negative for nausea, abdominal pain and diarrhea.  Genitourinary: Negative for dysuria.  Musculoskeletal: Negative for falls.  Skin: Negative for rash.  Neurological: Negative for loss of consciousness and headaches.  Endo/Heme/Allergies: Negative for polydipsia. Bruises/bleeds easily.  Psychiatric/Behavioral: Positive for substance abuse. Negative for depression, suicidal ideas and hallucinations. The patient is  nervous/anxious. The patient does not have insomnia.     Objective  BP 112/75  Pulse 95  Temp(Src) 98 F (36.7 C) (Oral)  Ht 5\' 6"  (1.676 m)  Wt 126 lb 1.9 oz (57.208 kg)  BMI  20.36 kg/m2  SpO2 100%  Physical Exam  Physical Exam  Constitutional: She is oriented to person, place, and time and well-developed, well-nourished, and in no distress. No distress.  HENT:  Head: Normocephalic and atraumatic.  Eyes: Conjunctivae are normal.  Neck: Neck supple. No thyromegaly present.  Cardiovascular: Normal rate, regular rhythm and normal heart sounds.   No murmur heard. Pulmonary/Chest: Effort normal and breath sounds normal. She has no wheezes.  Abdominal: She exhibits no distension and no mass.  Musculoskeletal: She exhibits no edema.  Lymphadenopathy:    She has no cervical adenopathy.  Neurological: She is alert and oriented to person, place, and time.  Skin: Skin is warm and dry. No rash noted. She is not diaphoretic.  Psychiatric: Memory, affect and judgment normal.     Lab Results  Component Value Date   WBC 5.2 03/29/2011   HGB 12.1 03/29/2011   HCT 35.2* 03/29/2011   MCV 94.5 03/29/2011   PLT 235.0 03/29/2011   Lab Results  Component Value Date   CREATININE 0.69 03/07/2011   CREATININE 0.69 03/07/2011   BUN 22 03/07/2011   BUN 22 03/07/2011   NA 140 03/07/2011   NA 140 03/07/2011   K 3.9 03/07/2011   K 3.9 03/07/2011   CL 104 03/07/2011   CL 104 03/07/2011   CO2 28 03/07/2011   CO2 28 03/07/2011   Lab Results  Component Value Date   ALT 27 03/07/2011   ALT 27 03/07/2011   AST 25 03/20/2011   ALKPHOS 113 03/07/2011   ALKPHOS 113 03/07/2011   BILITOT 0.5 03/07/2011   BILITOT 0.5 03/07/2011   Lab Results  Component Value Date   CHOL 195 03/20/2011   Lab Results  Component Value Date   HDL 76.00 03/20/2011   Lab Results  Component Value Date   LDLCALC 94 03/20/2011   Lab Results  Component Value Date   TRIG 123.0 03/20/2011   Lab Results  Component Value Date   CHOLHDL  3 03/20/2011     Assessment & Plan  Tachycardia Improved today but did not stop the diastolic at this point she did drink three quarters of a bubbling yesterday and had been back up to a bottle she is asked to start decreasing again back in his record as well for a few days and then down to half a bottle and then drop by class a day every few days. We will monitor her blood pressure and pulse in for blood pressure pulse began to trend up we will start half diastolic is instructed.  PURE HYPERCHOLESTEROLEMIA  Labs reviewed with patient today will continue her Lipitor and avoid trans-fats continue to monitor.  Bruising Bruising and pain in left upper extremity improving slowly x-rays were unremarkable patient may continue to use very infrequent Advil with food intake or try topical Aspercreme and report if symptoms worsen.  Alcohol abuse Patient has continued to struggle with alcohol and unfortunately has only cut down slightly. She did not receive lorazepam earlier this week. Last week she tried cutting back from about half a bottle and had excessive anxiety so went back up to a bottle of wine daily and yesterday did recommend to three quarters of a bottle but does acknowledge increased anxiety when she does that. Denies any chest pain, diaphoresis, nausea, hallucinations or palpitations thus far. She and her husband agree that they will start lorazepam 3 times a day as instructed which they had not previously done and only use any further  doses the area sparingly her husband will monitor and control medications. She will cut back and states records or less over the next couple days and continued to drop to a half and then drop by glass each day we will see her in followup in 2 weeks time or as needed they have started Foley catheter thiamine and they recurred to continue this

## 2011-04-05 NOTE — Assessment & Plan Note (Signed)
Bruising and pain in left upper extremity improving slowly x-rays were unremarkable patient may continue to use very infrequent Advil with food intake or try topical Aspercreme and report if symptoms worsen.

## 2011-04-05 NOTE — Assessment & Plan Note (Signed)
Improved today but did not stop the diastolic at this point she did drink three quarters of a bubbling yesterday and had been back up to a bottle she is asked to start decreasing again back in his record as well for a few days and then down to half a bottle and then drop by class a day every few days. We will monitor her blood pressure and pulse in for blood pressure pulse began to trend up we will start half diastolic is instructed.

## 2011-04-05 NOTE — Assessment & Plan Note (Signed)
Patient has continued to struggle with alcohol and unfortunately has only cut down slightly. She did not receive lorazepam earlier this week. Last week she tried cutting back from about half a bottle and had excessive anxiety so went back up to a bottle of wine daily and yesterday did recommend to three quarters of a bottle but does acknowledge increased anxiety when she does that. Denies any chest pain, diaphoresis, nausea, hallucinations or palpitations thus far. She and her husband agree that they will start lorazepam 3 times a day as instructed which they had not previously done and only use any further doses the area sparingly her husband will monitor and control medications. She will cut back and states records or less over the next couple days and continued to drop to a half and then drop by glass each day we will see her in followup in 2 weeks time or as needed they have started Foley catheter thiamine and they recurred to continue this

## 2011-04-05 NOTE — Patient Instructions (Signed)
Alcohol Withdrawal   Anytime drug use is interfering with normal living activities it has become abuse. This includes problems with family and friends. Psychological dependence has developed when your mind tells you that the drug is needed. This is usually followed by physical dependence when a continuing increase of drugs are required to get the same feeling or “high”. This is known as addiction or chemical dependency. A person's risk is much higher if there is a history of chemical dependency in the family.   Mild Withdrawal Following Stopping Alcohol, When Addiction or Chemical Dependency Has Developed   When a person has developed tolerance to alcohol, any sudden stopping of alcohol can cause uncomfortable physical symptoms (problems). Most of the time these are mild and consist of tremors in the hands and increases in heart rate, breathing, and temperature. Sometimes these symptoms are associated with anxiety, panic attacks, and bad dreams. There may also be stomach upset. Normal sleep patterns are often interrupted with periods of insomnia (inability to sleep). This may last for 6 months. Because of this discomfort, many people choose to continue drinking to get rid of this discomfort and to try to feel normal.   Severe Withdrawal with Decreased or No Alcohol Intake, When Addiction or Chemical Dependency Has Developed   About five percent of alcoholics will develop signs of severe withdrawal when they stop using alcohol. One sign of this is development of generalized seizures (convulsions). Other signs of this are severe agitation and confusion. This may be associated with delusions and hallucinations (believing in things which are not real or seeing things which are not really there). Vitamin deficiencies are usually present if alcohol intake has been long term. Treatment for this most often requires hospitalization and close observation.   Addiction can only be helped by stopping use of all chemicals. This  is hard but may save your life. With continual alcohol use possible outcomes are usually loss of self respect and esteem, violence, and death. It can eventually lead to prison if the use of alcohol has caused the death of another.   Addiction cannot be cured but it can be stopped. This often requires outside help and the care of professionals. Treatment centers are listed in the yellow pages under Cocaine, Narcotics, and Alcoholics anonymous. Most hospitals and clinics can refer you to a specialized care center.   It is not necessary for you to go through the uncomfortable symptoms of withdrawal. Your caregiver can provide you with medications that will help you through this difficult period. Try to avoid situations, friends, or drugs that made it possible for you to keep using alcohol in the past. Learn how to say no!   It takes a long period of time to overcome addictions to all drugs, including alcohol. There may be many times when you feel as though you want a drink. All this amounts to is a want. After getting rid of the physical addiction and withdrawal, you will have a lessening of the craving which tells you that you need alcohol to feel normal. Call your caregiver if more support is needed. Learn who to talk to in your family and among your friends so that during these periods you can receive outside help. AA (Alcoholics Anonymous) has helped many people over the years. To get further help, contact AA or call your caregiver, counselor or minister. AL-ANON and ALA-TEEN are support groups for friends and family members of an alcoholic. The people who love and care for an alcoholic   often need help, too. For information about these organizations, check your phone directory or call a local alcoholism treatment center.   SEEK IMMEDIATE MEDICAL CARE IF:   You have a seizure.   You develop a high fever.   You experience uncontrolled vomiting or you vomit up blood. This may be bright red or look like black coffee  grounds.   You have blood in the stool. This may be bright red or appear as a black tarry, foul smelling stool.   You become light headed or faint. DO NOT DRIVE IF YOU FEEL THIS WAY. Have someone else drive you or call 911 for help.   You become more agitated or confused.   You develop uncontrolled anxiety.   You begin to have hallucinations (see things that are not really there).   Your caregiver has determined that you completely understand your medical condition, and that your mental state is back to normal. You understand that you have been treated for alcohol withdrawal, have agreed not to drink any alcohol for a minimum of 1 day, will not operate a car or other machinery for 24 hours, and have had an opportunity to ask any questions about your condition.   Document Released: 08/08/2005 Document Re-Released: 01/23/2010   [employer]® Patient Information ©2011 [employer].

## 2011-04-05 NOTE — Assessment & Plan Note (Signed)
Labs reviewed with patient today will continue her Lipitor and avoid trans-fats continue to monitor.

## 2011-04-12 ENCOUNTER — Encounter: Payer: Self-pay | Admitting: Internal Medicine

## 2011-04-20 ENCOUNTER — Ambulatory Visit (INDEPENDENT_AMBULATORY_CARE_PROVIDER_SITE_OTHER): Payer: 59 | Admitting: Family Medicine

## 2011-04-20 ENCOUNTER — Encounter: Payer: Self-pay | Admitting: Family Medicine

## 2011-04-20 VITALS — BP 111/76 | HR 86 | Temp 97.9°F | Ht 66.0 in | Wt 125.8 lb

## 2011-04-20 DIAGNOSIS — F32A Depression, unspecified: Secondary | ICD-10-CM

## 2011-04-20 DIAGNOSIS — R000 Tachycardia, unspecified: Secondary | ICD-10-CM

## 2011-04-20 DIAGNOSIS — F329 Major depressive disorder, single episode, unspecified: Secondary | ICD-10-CM

## 2011-04-20 DIAGNOSIS — F1010 Alcohol abuse, uncomplicated: Secondary | ICD-10-CM

## 2011-04-20 DIAGNOSIS — F411 Generalized anxiety disorder: Secondary | ICD-10-CM

## 2011-04-20 MED ORDER — CITALOPRAM HYDROBROMIDE 10 MG PO TABS: 10.0000 mg | ORAL_TABLET | Freq: Every day | ORAL | Status: DC

## 2011-04-20 NOTE — Patient Instructions (Signed)
Alcohol and Drug Use  When Is It Abuse? When Is It Addiction? When alcohol and/or drug use interferes with normal living activities, it is called alcohol and/or drug abuse. Problems can be with family and friends, one's job, health issues, legal issues, financial and emotional problems. Mood swings are not uncommon. Depression may even develop and persist. When abuse continues even with bad consequences, the substance abuse may have progressed to addiction. Addiction to alcohol is usually referred to as alcoholism. Addiction to other substances is often referred to as drug addiction. Chemical dependency also describes the condition where a person cannot control their alcohol and/or drug use. CAUSES   The cause of alcoholism and drug addiction (chemical dependency) is unknown.   Research has found that having an alcoholic family member makes it more likely to have another family member become an alcoholic as well.   A person's risk for developing alcoholism or drug addiction can increase based on the person's environment. This includes:   Where and how he or she lives.  Family, friends, and culture.   Peer pressure.  How easy it is to get alcohol and drugs.   SYMPTOMS  A strong need or instinct to drink and/or use drugs (cravings).   Not able to limit drinking and/or drug-taking on any given occasion (loss of control).   Withdrawal symptoms such as nausea, sweating, shakiness and anxiety occur when alcohol and/or drug use is stopped after a period of heavy drinking and/or using (physical dependence).   The need to drink greater amounts of alcohol or take greater amounts of drugs in order to "get high" (tolerance).  DIAGNOSIS The diagnosis is best made by a physician who specializes in addiction medicine or a licensed drug and alcohol specialist. However, it is possible to get an idea if you have a problem by answering the questions below.  Have you been told by friends or family that  drugs/alcohol has become a problem?   Do you get into fights when drinking or using drugs?   Do you not remember what you do while using (blackouts)?   Do you lie about use or amounts of drugs or alcohol you consume?   Do you need chemicals to get you going?   Do you suffer in work performance or school because of drug or alcohol use?   Do you get sick from use of drugs or alcohol, but keep using anyway?   Do you need drugs or alcohol to relate to people or feel comfortable in social situations?   Do you use drugs or alcohol to forget problems?  If you answered yes to any of the above questions, you show signs of chemical dependency. Professional evaluation is suggested. The longer you use drugs and alcohol, the greater the problems will become. TREATMENT Alcoholism/addiction has little to do with willpower. Alcoholics and addicts have an uncontrollable need for alcohol and/or drugs that overrides their ability to stop drinking or using. This need can be as strong as the need for food or water. Although some people are able to recover from alcoholism/addiction without help, most alcoholics and addicts need help. With treatment and support, many people are able to stop drinking and using drugs. Long term recovery is possible.  Addiction cannot be cured but it can be treated successfully. Most hospitals and clinics can refer you to a specialized care center.  Document Released: 10/23/2001 Document Re-Released: 08/07/2008 [facility] Patient Information 2011 [place], [place].  Please cut back on the Lorazepam to no  more than 1 daily until you are ready to cut down on the alchol further.  For the diarrhea consider a Gatorade or Powerade or Pedialyte daily and start a probiotici such as the samples of Restora or OTC Align 1 cap daily whenever you have an episode of diarrhea or stomach upset.

## 2011-04-25 ENCOUNTER — Encounter: Payer: Self-pay | Admitting: Family Medicine

## 2011-04-25 NOTE — Assessment & Plan Note (Signed)
Improved today, she will return next week for further evaluation

## 2011-04-25 NOTE — Assessment & Plan Note (Signed)
Patient admits to dealing worsening anxiety and depression since her breast cancer diagnosis. She agrees to start an antidepressant, Citalopram today. She denies any suicidal or homicidal ideation. She agrees to seek immediate care if any of her symptoms worsen

## 2011-04-25 NOTE — Assessment & Plan Note (Signed)
Patient is brought in today by her daughter for evaluation but patient does not want her daughter in the room. She has cut down from 1 bottle of wine to 3/4 of a bottle but she continues to drink and she is taking the Ativan several times a day. The patient does not agree to cut down on her drinking more at this time and declines inpatient rehab but she does agree to cut her Ativan use down to once daily unless she decides to decrease her drinking later.

## 2011-04-25 NOTE — Progress Notes (Signed)
Anne [name] [id number] April 15, 1941 04/25/2011      Progress Note-Follow Up  Subjective  Chief Complaint  Chief Complaint  Patient presents with  . Alcohol Problem    can't stop drinking    HPI  Patient is in today for evaluation of ongoing drinking and worsening depression. She admits to drinking roughly 3/4 of bottle of wine daily. She has cut down some but does not want to cut down further, she continues to take her Ativan several times a day. She denies any suicidal or homicidal ideation but she notes her depression has been worsening since her breast cancer diagnosis. She is noting she is also struggling with some tensions in her marriage and she is willing to try an antidepressant to help with her stress. She is noting fatigue but she is not offering any other physical complaints such as CP/palp/SOB/GI or GU c/o.  Past Medical History  Diagnosis Date  . Anemia   . Anxiety   . COPD (chronic obstructive pulmonary disease)   . Hyperlipidemia   . Pneumonia 2-10    pleurisy  . Arthritis   . Depression   . GERD (gastroesophageal reflux disease)     improved s/p cholecystectomy and esophagus dilatation  . History of chicken pox   . History of shingles     2 episodes  . History of measles   . Osteopenia 03/14/2011  . 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 Pure hypercholesterolemia 10/17/2010  . Constipation 03/15/2011  . 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 GERD 09/29/2009  . ESOPHAGEAL STRICTURE 03/29/2009  . CONSTIPATION 09/29/2009  . CAROTID ARTERY STENOSIS 01/13/2010  . ANXIETY 09/29/2009  . ALKALINE PHOSPHATASE, ELEVATED 03/15/2009    Past Surgical History  Procedure Date  . Anterior cruciate ligament repair 08, 09, 10  . Ercp, cbd stone extraction 2010  . Cholecystectomy 2010  . Egd/ dili 2010  . Breast surgery 2001, 2008    lumpectomy (01), Mastectomy modified radical (08), breast reconstruction, CA lesions excised lateral abd wall 2010     Family History  Problem Relation Age of Onset  . Heart disease Father   . Cancer Father     lung cancer/smoker  . Cirrhosis Sister     Primary Biliary  . Cancer Other     Breast, ovarian, pancreatic  . Stroke Maternal Grandmother   . Alcohol abuse Maternal Grandfather   . Heart disease Paternal Grandfather   . Anxiety disorder Sister   . Osteoporosis Sister   . Cancer Sister     skin  . Osteoporosis Sister   . Other Mother     tic deleauroux    History   Social History  . Marital Status: Married    Spouse Name: N/A    Number of Children: N/A  . Years of Education: N/A   Occupational History  . Not on file.   Social History Main Topics  . Smoking status: Former Smoker    Quit date: 05/22/2007  . Smokeless tobacco: Never Used  . Alcohol Use: Yes     1 bottle of wine a week  . Drug Use: No  . Sexually Active: Yes -- Female partner(s)   Other Topics Concern  . Not on file   Social History Narrative  . No narrative on file    Current Outpatient Prescriptions on File Prior to Visit  Medication Sig Dispense Refill  . ALPRAZolam (XANAX) 0.5 MG tablet Take 0.5 mg  by mouth at bedtime as needed.        [name] amitriptyline (ELAVIL) 150 MG tablet Take 150 mg by mouth at bedtime.        [name] anastrozole (ARIMIDEX) 1 MG tablet Take 1 mg by mouth daily.        [name] aspirin 81 MG tablet Take 81 mg by mouth daily.        [name] atorvastatin (LIPITOR) 40 MG tablet Take 40 mg by mouth daily.       . beta carotene 33295 UNIT capsule Take 25,000 Units by mouth daily.        . Calcium Citrate (CITRACAL PO) Take 500 mg by mouth daily.        . Cholecalciferol (VITAMIN D3) 1000 UNITS CAPS Take 2 tablets by mouth daily.        . diphenoxylate-atropine (LOMOTIL) 2.5-0.025 MG per tablet Take 1 tablet by mouth as needed.        . folic acid (FOLVITE) 1 MG tablet Take 1 tablet (1 mg total) by mouth daily.  30 tablet  11  . LORazepam (ATIVAN) 1 MG tablet 1-2 tabs po tid prn anxiety, tremulousness,  Take 1 tab 3 x daily for next week and the others only as needed as you quit alcohol  90 tablet  0  . nebivolol (BYSTOLIC) 5 MG tablet 1/2 tab po daily prn BP>135/90, P>105  21 tablet  0  . NON FORMULARY Zinc oxide 50 mg       . omeprazole (PRILOSEC) 20 MG capsule Take 20 mg by mouth 2 (two) times daily.        . ondansetron (ZOFRAN) 8 MG tablet Take by mouth 2 (two) times daily.        [name] pyridOXINE (VITAMIN B-6) 50 MG tablet Take 50 mg by mouth 3 (three) times daily.        [name] thiamine 100 MG tablet Take 1 tablet (100 mg total) by mouth daily.  30 tablet  11  . vitamin B-12 (CYANOCOBALAMIN) 1000 MCG tablet Take 1,000 mcg by mouth daily.          Allergies  Allergen Reactions  . Codeine   . Diphenhydramine Hcl   . Morphine     Review of Systems  Review of Systems  Constitutional: Negative for fever and malaise/fatigue.  HENT: Negative for congestion.   Eyes: Negative for discharge.  Respiratory: Negative for shortness of breath.   Cardiovascular: Negative for chest pain, palpitations and leg swelling.  Gastrointestinal: Negative for nausea, abdominal pain and diarrhea.  Genitourinary: Negative for dysuria.  Musculoskeletal: Negative for falls.  Skin: Negative for rash.  Neurological: Negative for loss of consciousness and headaches.  Endo/Heme/Allergies: Negative for polydipsia.  Psychiatric/Behavioral: Positive for depression and substance abuse. Negative for suicidal ideas. The patient is nervous/anxious. The patient does not have insomnia.     Objective  BP 111/76  Pulse 86  Temp(Src) 97.9 F (36.6 C) (Oral)  Ht 5\' 6"  (1.676 m)  Wt 125 lb 12.8 oz (57.063 kg)  BMI 20.30 kg/m2  SpO2 96%  Physical Exam  Physical Exam  Constitutional: She is oriented to person, place, and time and well-developed, well-nourished, and in no distress. No distress.  HENT:  Head: Normocephalic and atraumatic.  Eyes: Conjunctivae are normal.  Neck: Neck supple. No thyromegaly present.   Cardiovascular: Normal rate, regular rhythm and normal heart sounds.   No murmur heard. Pulmonary/Chest: Effort normal and breath sounds normal. She has no wheezes.  Abdominal: She exhibits no distension and no mass.  Musculoskeletal: She exhibits no edema.  Lymphadenopathy:    She has no cervical adenopathy.  Neurological: She is alert and oriented to person, place, and time.  Skin: Skin is warm and dry. No rash noted. She is not diaphoretic.  Psychiatric: Memory, affect and judgment normal.     Lab Results  Component Value Date   WBC 5.2 03/29/2011   HGB 12.1 03/29/2011   HCT 35.2* 03/29/2011   MCV 94.5 03/29/2011   PLT 235.0 03/29/2011   Lab Results  Component Value Date   CREATININE 0.69 03/07/2011   CREATININE 0.69 03/07/2011   BUN 22 03/07/2011   BUN 22 03/07/2011   NA 140 03/07/2011   NA 140 03/07/2011   K 3.9 03/07/2011   K 3.9 03/07/2011   CL 104 03/07/2011   CL 104 03/07/2011   CO2 28 03/07/2011   CO2 28 03/07/2011   Lab Results  Component Value Date   ALT 27 03/07/2011   ALT 27 03/07/2011   AST 25 03/20/2011   ALKPHOS 113 03/07/2011   ALKPHOS 113 03/07/2011   BILITOT 0.5 03/07/2011   BILITOT 0.5 03/07/2011   Lab Results  Component Value Date   CHOL 195 03/20/2011   Lab Results  Component Value Date   HDL 76.00 03/20/2011   Lab Results  Component Value Date   LDLCALC 94 03/20/2011   Lab Results  Component Value Date   TRIG 123.0 03/20/2011   Lab Results  Component Value Date   CHOLHDL 3 03/20/2011     Assessment & Plan  Alcohol abuse Patient is brought in today by her daughter for evaluation but patient does not want her daughter in the room. She has cut down from 1 bottle of wine to 3/4 of a bottle but she continues to drink and she is taking the Ativan several times a day. The patient does not agree to cut down on her drinking more at this time and declines inpatient rehab but she does agree to cut her Ativan use down to once daily unless she decides to decrease her  drinking later.   ANXIETY Patient admits to dealing worsening anxiety and depression since her breast cancer diagnosis. She agrees to start an antidepressant, Citalopram today. She denies any suicidal or homicidal ideation. She agrees to seek immediate care if any of her symptoms worsen  Tachycardia Improved today, she will return next week for further evaluation

## 2011-04-27 ENCOUNTER — Ambulatory Visit (INDEPENDENT_AMBULATORY_CARE_PROVIDER_SITE_OTHER): Payer: 59 | Admitting: Family Medicine

## 2011-04-27 ENCOUNTER — Encounter: Payer: Self-pay | Admitting: Family Medicine

## 2011-04-27 VITALS — BP 116/80 | HR 90 | Temp 98.2°F | Ht 66.0 in | Wt 127.8 lb

## 2011-04-27 DIAGNOSIS — F341 Dysthymic disorder: Secondary | ICD-10-CM

## 2011-04-27 DIAGNOSIS — F329 Major depressive disorder, single episode, unspecified: Secondary | ICD-10-CM

## 2011-04-27 DIAGNOSIS — F1010 Alcohol abuse, uncomplicated: Secondary | ICD-10-CM

## 2011-04-27 DIAGNOSIS — R000 Tachycardia, unspecified: Secondary | ICD-10-CM

## 2011-04-27 MED ORDER — LORAZEPAM 1 MG PO TABS: ORAL_TABLET | ORAL | Status: DC

## 2011-04-27 NOTE — Patient Instructions (Addendum)
Alcohol Withdrawal Anytime drug use is interfering with normal living activities it has become abuse. This includes problems with family and friends. Psychological dependence has developed when your mind tells you that the drug is needed. This is usually followed by physical dependence when a continuing increase of drugs are required to get the same feeling or "high". This is known as addiction or chemical dependency. A person's risk is much higher if there is a history of chemical dependency in the family. Mild Withdrawal Following Stopping Alcohol, When Addiction or Chemical Dependency Has Developed When a person has developed tolerance to alcohol, any sudden stopping of alcohol can cause uncomfortable physical symptoms (problems). Most of the time these are mild and consist of tremors in the hands and increases in heart rate, breathing, and temperature. Sometimes these symptoms are associated with anxiety, panic attacks, and bad dreams. There may also be stomach upset. Normal sleep patterns are often interrupted with periods of insomnia (inability to sleep). This may last for 6 months. Because of this discomfort, many people choose to continue drinking to get rid of this discomfort and to try to feel normal. Severe Withdrawal with Decreased or No Alcohol Intake, When Addiction or Chemical Dependency Has Developed About five percent of alcoholics will develop signs of severe withdrawal when they stop using alcohol. One sign of this is development of generalized seizures (convulsions). Other signs of this are severe agitation and confusion. This may be associated with delusions and hallucinations (believing in things which are not real or seeing things which are not really there). Vitamin deficiencies are usually present if alcohol intake has been long term. Treatment for this most often requires hospitalization and close observation. Addiction can only be helped by stopping use of all chemicals. This is hard  but may save your life. With continual alcohol use possible outcomes are usually loss of self respect and esteem, violence, and death. It can eventually lead to prison if the use of alcohol has caused the death of another. Addiction cannot be cured but it can be stopped. This often requires outside help and the care of professionals. Treatment centers are listed in the yellow pages under Cocaine, Narcotics, and Alcoholics anonymous. Most hospitals and clinics can refer you to a specialized care center. It is not necessary for you to go through the uncomfortable symptoms of withdrawal. Your caregiver can provide you with medications that will help you through this difficult period. Try to avoid situations, friends, or drugs that made it possible for you to keep using alcohol in the past. Learn how to say no! It takes a long period of time to overcome addictions to all drugs, including alcohol. There may be many times when you feel as though you want a drink. All this amounts to is a want. After getting rid of the physical addiction and withdrawal, you will have a lessening of the craving which tells you that you need alcohol to feel normal. Call your caregiver if more support is needed. Learn who to talk to in your family and among your friends so that during these periods you can receive outside help. AA (Alcoholics Anonymous) has helped many people over the years. To get further help, contact AA or call your caregiver, counselor or minister. AL-ANON and ALA-TEEN are support groups for friends and family members of an alcoholic. The people who love and care for an alcoholic often need help, too. For information about these organizations, check your phone directory or call a local alcoholism  treatment center.  SEEK IMMEDIATE MEDICAL CARE IF:  You have a seizure.   You develop a high fever.   You experience uncontrolled vomiting or you vomit up blood. This may be bright red or look like black coffee grounds.    You have blood in the stool. This may be bright red or appear as a black tarry, foul smelling stool.   You become light headed or faint. DO NOT DRIVE IF YOU FEEL THIS WAY. Have someone else drive you or call 161 for help.   You become more agitated or confused.   You develop uncontrolled anxiety.   You begin to have hallucinations (see things that are not really there).  Your caregiver has determined that you completely understand your medical condition, and that your mental state is back to normal. You understand that you have been treated for alcohol withdrawal, have agreed not to drink any alcohol for a minimum of 1 day, will not operate a car or other machinery for 24 hours, and have had an opportunity to ask any questions about your condition. Document Released: 08/08/2005 Document Re-Released: 01/23/2010 [facility] Patient Information 2011 [place], [place].  Increase the Citalopram 10 mg tabs to two tabs daily and if tolerating call next week for new prescription of the Citalopram 20 mg tabs one daily

## 2011-04-28 ENCOUNTER — Encounter: Payer: Self-pay | Admitting: Family Medicine

## 2011-04-28 DIAGNOSIS — F419 Anxiety disorder, unspecified: Secondary | ICD-10-CM

## 2011-04-28 DIAGNOSIS — F329 Major depressive disorder, single episode, unspecified: Secondary | ICD-10-CM | POA: Insufficient documentation

## 2011-04-28 DIAGNOSIS — F32A Depression, unspecified: Secondary | ICD-10-CM

## 2011-04-28 HISTORY — DX: Depression, unspecified: F32.A

## 2011-04-28 HISTORY — DX: Anxiety disorder, unspecified: F41.9

## 2011-04-28 NOTE — Progress Notes (Signed)
Anne [name] [id number] 04-Oct-1941 04/28/2011      Progress Note-Follow Up  Subjective  Chief Complaint  Chief Complaint  Patient presents with  . Follow-up    1 week follow up    HPI  Patient is in today accompanied by her husband and has done a very good job of cutting down on her alcohol consumption over the past week. She had been taking the Levaquin nightly when she was seen last week and has titrated herself a couple of glasses each night last night she drinks one glass of wine. Denies any palpitations, chest pain, shortness of breath, hallucinations, or neurologic concerns with this change. She does note feeling anxious and jittery at times but uses the Ativan with good effect. Complains of pulling in the next week. She did quit to start Celexa at the last visit and has not had any difficulty with that either she denies any upset stomach, nausea, diarrhea any change in sleeping patterns or other concerns. She is willing to continue taking it despite not seeing any benefit so far in terms of her mood. She does not believe it is helped with her anxiety depression at this point is willing to continue taking. Past Medical History  Diagnosis Date  . Anemia   . Anxiety   . COPD (chronic obstructive pulmonary disease)   . Hyperlipidemia   . Pneumonia 2-10    pleurisy  . Arthritis   . Depression   . GERD (gastroesophageal reflux disease)     improved s/p cholecystectomy and esophagus dilatation  . History of chicken pox   . History of shingles     2 episodes  . History of measles   . Osteopenia 03/14/2011  . 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 Pure hypercholesterolemia 10/17/2010  . Constipation 03/15/2011  . 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 GERD 09/29/2009  . ESOPHAGEAL STRICTURE 03/29/2009  . CONSTIPATION 09/29/2009  . CAROTID ARTERY STENOSIS 01/13/2010  . ANXIETY 09/29/2009  . ALKALINE PHOSPHATASE, ELEVATED 03/15/2009  . Anxiety and depression  04/28/2011    Past Surgical History  Procedure Date  . Anterior cruciate ligament repair 08, 09, 10  . Ercp, cbd stone extraction 2010  . Cholecystectomy 2010  . Egd/ dili 2010  . Breast surgery 2001, 2008    lumpectomy (01), Mastectomy modified radical (08), breast reconstruction, CA lesions excised lateral abd wall 2010    Family History  Problem Relation Age of Onset  . Heart disease Father   . Cancer Father     lung cancer/smoker  . Cirrhosis Sister     Primary Biliary  . Cancer Other     Breast, ovarian, pancreatic  . Stroke Maternal Grandmother   . Alcohol abuse Maternal Grandfather   . Heart disease Paternal Grandfather   . Anxiety disorder Sister   . Osteoporosis Sister   . Cancer Sister     skin  . Osteoporosis Sister   . Other Mother     tic deleauroux    History   Social History  . Marital Status: Married    Spouse Name: N/A    Number of Children: N/A  . Years of Education: N/A   Occupational History  . Not on file.   Social History Main Topics  . Smoking status: Former Smoker    Quit date: 05/22/2007  . Smokeless tobacco: Never Used  . Alcohol Use: Yes     6 oz of wine daily  .  Drug Use: No  . Sexually Active: Yes -- Female partner(s)   Other Topics Concern  . Not on file   Social History Narrative  . No narrative on file    Current Outpatient Prescriptions on File Prior to Visit  Medication Sig Dispense Refill  . ALPRAZolam (XANAX) 0.5 MG tablet Take 0.5 mg by mouth at bedtime as needed.        [name] amitriptyline (ELAVIL) 150 MG tablet Take 150 mg by mouth at bedtime.        [name] anastrozole (ARIMIDEX) 1 MG tablet Take 1 mg by mouth daily.        [name] aspirin 81 MG tablet Take 81 mg by mouth daily.        [name] atorvastatin (LIPITOR) 40 MG tablet Take 40 mg by mouth daily.       . beta carotene 16109 UNIT capsule Take 25,000 Units by mouth daily.        . Calcium Citrate (CITRACAL PO) Take 500 mg by mouth daily.        . Cholecalciferol (VITAMIN D3)  1000 UNITS CAPS Take 2 tablets by mouth daily.        . citalopram (CELEXA) 10 MG tablet Take 1 tablet (10 mg total) by mouth daily.  30 tablet  2  . diphenoxylate-atropine (LOMOTIL) 2.5-0.025 MG per tablet Take 1 tablet by mouth as needed.        . folic acid (FOLVITE) 1 MG tablet Take 1 tablet (1 mg total) by mouth daily.  30 tablet  11  . nebivolol (BYSTOLIC) 5 MG tablet 1/2 tab po daily prn BP>135/90, P>105  21 tablet  0  . NON FORMULARY Zinc oxide 50 mg       . omeprazole (PRILOSEC) 20 MG capsule Take 20 mg by mouth 2 (two) times daily.        . ondansetron (ZOFRAN) 8 MG tablet Take by mouth 2 (two) times daily.        [name] pyridOXINE (VITAMIN B-6) 50 MG tablet Take 50 mg by mouth 3 (three) times daily.        [name] thiamine 100 MG tablet Take 1 tablet (100 mg total) by mouth daily.  30 tablet  11  . vitamin B-12 (CYANOCOBALAMIN) 1000 MCG tablet Take 1,000 mcg by mouth daily.          Allergies  Allergen Reactions  . Codeine   . Diphenhydramine Hcl   . Morphine     Review of Systems  Review of Systems  Constitutional: Positive for malaise/fatigue. Negative for fever.  HENT: Negative for congestion.   Eyes: Negative for discharge.  Respiratory: Negative for shortness of breath.   Cardiovascular: Negative for chest pain, palpitations and leg swelling.  Gastrointestinal: Negative for nausea, abdominal pain and diarrhea.  Genitourinary: Negative for dysuria.  Musculoskeletal: Negative for falls.  Skin: Negative for rash.  Neurological: Negative for loss of consciousness and headaches.  Endo/Heme/Allergies: Negative for polydipsia.  Psychiatric/Behavioral: Positive for depression and substance abuse. Negative for suicidal ideas. The patient is not nervous/anxious and does not have insomnia.     Objective  BP 116/80  Pulse 90  Temp(Src) 98.2 F (36.8 C) (Oral)  Ht 5\' 6"  (1.676 m)  Wt 127 lb 12.8 oz (57.97 kg)  BMI 20.63 kg/m2  SpO2 96%  Physical Exam  Physical Exam    Constitutional: She is oriented to person, place, and time. She appears well-developed and well-nourished. No distress.  HENT:  Head: Normocephalic  and atraumatic.  Eyes: Conjunctivae are normal. Pupils are equal, round, and reactive to light. Right eye exhibits no discharge. Left eye exhibits no discharge. No scleral icterus.  Neck: Normal range of motion. Neck supple.  Cardiovascular: Normal rate, regular rhythm and normal heart sounds.   Pulmonary/Chest: Effort normal and breath sounds normal. No respiratory distress. She has no wheezes.  Abdominal: Soft. Bowel sounds are normal. She exhibits no distension. There is no tenderness.  Musculoskeletal: Normal range of motion. She exhibits no edema.  Lymphadenopathy:    She has no cervical adenopathy.  Neurological: She is alert and oriented to person, place, and time. She exhibits normal muscle tone.  Skin: Skin is warm and dry. She is not diaphoretic.  Psychiatric: Her behavior is normal. Thought content normal.      Lab Results  Component Value Date   WBC 5.2 03/29/2011   HGB 12.1 03/29/2011   HCT 35.2* 03/29/2011   MCV 94.5 03/29/2011   PLT 235.0 03/29/2011   Lab Results  Component Value Date   CREATININE 0.69 03/07/2011   CREATININE 0.69 03/07/2011   BUN 22 03/07/2011   BUN 22 03/07/2011   NA 140 03/07/2011   NA 140 03/07/2011   K 3.9 03/07/2011   K 3.9 03/07/2011   CL 104 03/07/2011   CL 104 03/07/2011   CO2 28 03/07/2011   CO2 28 03/07/2011   Lab Results  Component Value Date   ALT 27 03/07/2011   ALT 27 03/07/2011   AST 25 03/20/2011   ALKPHOS 113 03/07/2011   ALKPHOS 113 03/07/2011   BILITOT 0.5 03/07/2011   BILITOT 0.5 03/07/2011   Lab Results  Component Value Date   CHOL 195 03/20/2011   Lab Results  Component Value Date   HDL 76.00 03/20/2011   Lab Results  Component Value Date   LDLCALC 94 03/20/2011   Lab Results  Component Value Date   TRIG 123.0 03/20/2011   Lab Results  Component Value Date   CHOLHDL 3 03/20/2011      Assessment & Plan  Alcohol abuse Patient is in with her husband and has made a very strong effort to cut down significantly on her alcohol consumption this week. She is down from a bottle of wine a night to just one glass of wine daily. Her plan is to quit completely, she is encouraged to stick with one glass daily for roughly the next 4 days and then stop. She may use the Ativan as prescribed to help with the process, we will see her in f/u in 3 weeks or as needed.  Anxiety and depression She hs tolerated the Citalopram at 10 mg without any side effects and would like to increase to the 20mg  dose. She will take two tabs til gone and then start on a 20mg  tab, taking one daily. She will report any concerning side effects and we will reevaluate in 3 weeks  Tachycardia Well controlled today even with Alcohol reduction. They are monitoring at home and will report any increase in BP or pulse

## 2011-04-28 NOTE — Assessment & Plan Note (Signed)
Patient is in with her husband and has made a very strong effort to cut down significantly on her alcohol consumption this week. She is down from a bottle of wine a night to just one glass of wine daily. Her plan is to quit completely, she is encouraged to stick with one glass daily for roughly the next 4 days and then stop. She may use the Ativan as prescribed to help with the process, we will see her in f/u in 3 weeks or as needed.

## 2011-04-28 NOTE — Assessment & Plan Note (Signed)
She hs tolerated the Citalopram at 10 mg without any side effects and would like to increase to the 20mg  dose. She will take two tabs til gone and then start on a 20mg  tab, taking one daily. She will report any concerning side effects and we will reevaluate in 3 weeks

## 2011-04-28 NOTE — Assessment & Plan Note (Signed)
Well controlled today even with Alcohol reduction. They are monitoring at home and will report any increase in BP or pulse

## 2011-04-30 ENCOUNTER — Ambulatory Visit (INDEPENDENT_AMBULATORY_CARE_PROVIDER_SITE_OTHER): Payer: 59 | Admitting: Internal Medicine

## 2011-04-30 ENCOUNTER — Encounter: Payer: Self-pay | Admitting: Internal Medicine

## 2011-04-30 VITALS — BP 100/70 | HR 80 | Ht 66.0 in | Wt 128.0 lb

## 2011-04-30 DIAGNOSIS — E7800 Pure hypercholesterolemia, unspecified: Secondary | ICD-10-CM

## 2011-04-30 DIAGNOSIS — I2510 Atherosclerotic heart disease of native coronary artery without angina pectoris: Secondary | ICD-10-CM

## 2011-04-30 DIAGNOSIS — F1010 Alcohol abuse, uncomplicated: Secondary | ICD-10-CM

## 2011-04-30 NOTE — Patient Instructions (Signed)
Your physician wants you to follow-up in: 1 year. You will receive a reminder letter in the mail two months in advance. If you don't receive a letter, please call our office to schedule the follow-up appointment.  

## 2011-04-30 NOTE — Progress Notes (Addendum)
HPI Patient is a 70 year old with a history of CV disease and dyslpiidemia.  She also has a history of ETOH abuse.  I saw her in clinic 1 year ago and have been f/yu with lipids since. Patient denies chest pains.  No dizziness, weakness.   Has cut back on EtOH.  Now drinkng 2 oz wine 3x per day. Allergies  Allergen Reactions  . Codeine   . Diphenhydramine Hcl   . Morphine     Current Outpatient Prescriptions  Medication Sig Dispense Refill  . amitriptyline (ELAVIL) 150 MG tablet Take 150 mg by mouth at bedtime.        [name] anastrozole (ARIMIDEX) 1 MG tablet Take 1 mg by mouth daily.        [name] aspirin 81 MG tablet Take 81 mg by mouth daily.        [name] atorvastatin (LIPITOR) 40 MG tablet Take 40 mg by mouth daily.       . beta carotene 16109 UNIT capsule Take 25,000 Units by mouth daily.        . Calcium Citrate (CITRACAL PO) Take 500 mg by mouth daily.        . Cholecalciferol (VITAMIN D3) 1000 UNITS CAPS Take 2 tablets by mouth daily.        . citalopram (CELEXA) 10 MG tablet Take 20 mg by mouth daily.        . folic acid (FOLVITE) 1 MG tablet Take 1 tablet (1 mg total) by mouth daily.  30 tablet  11  . LORazepam (ATIVAN) 1 MG tablet 1-2 tabs po tid prn anxiety, tremulousness, Take 1 tab 3 x daily for next week and the others only as needed as you quit alcohol  90 tablet  0  . nebivolol (BYSTOLIC) 5 MG tablet 1/2 tab po daily prn BP>135/90, P>105  21 tablet  0  . NON FORMULARY Zinc oxide 50 mg       . omeprazole (PRILOSEC) 20 MG capsule Take 20 mg by mouth 2 (two) times daily.        [name] pyridOXINE (VITAMIN B-6) 50 MG tablet Take 50 mg by mouth 3 (three) times daily.        [name] thiamine 100 MG tablet Take 1 tablet (100 mg total) by mouth daily.  30 tablet  11  . vitamin B-12 (CYANOCOBALAMIN) 1000 MCG tablet Take 1,000 mcg by mouth daily.        [name] DISCONTD: citalopram (CELEXA) 10 MG tablet Take 1 tablet (10 mg total) by mouth daily.  30 tablet  2  . diphenoxylate-atropine (LOMOTIL) 2.5-0.025 MG  per tablet Take 1 tablet by mouth as needed.        . ondansetron (ZOFRAN) 8 MG tablet Take by mouth 2 (two) times daily.        [name] DISCONTD: ALPRAZolam (XANAX) 0.5 MG tablet Take 0.5 mg by mouth at bedtime as needed.          Past Medical History  Diagnosis Date  . Anemia   . Anxiety   . COPD (chronic obstructive pulmonary disease)   . Hyperlipidemia   . Pneumonia 2-10    pleurisy  . Arthritis   . Depression   . GERD (gastroesophageal reflux disease)     improved s/p cholecystectomy and esophagus dilatation  . History of chicken pox   . History of shingles     2 episodes  . History of measles   . Osteopenia 03/14/2011  . 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 Pure hypercholesterolemia 10/17/2010  . Constipation 03/15/2011  . 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 GERD 09/29/2009  . ESOPHAGEAL STRICTURE 03/29/2009  . CONSTIPATION 09/29/2009  . CAROTID ARTERY STENOSIS 01/13/2010  . ANXIETY 09/29/2009  . ALKALINE PHOSPHATASE, ELEVATED 03/15/2009  . Anxiety and depression 04/28/2011    Past Surgical History  Procedure Date  . Anterior cruciate ligament repair 08, 09, 10  . Ercp, cbd stone extraction 2010  . Cholecystectomy 2010  . Egd/ dili 2010  . Breast surgery 2001, 2008    lumpectomy (01), Mastectomy modified radical (08), breast reconstruction, CA lesions excised lateral abd wall 2010    Family History  Problem Relation Age of Onset  . Heart disease Father   . Cancer Father     lung cancer/smoker  . Cirrhosis Sister     Primary Biliary  . Cancer Other     Breast, ovarian, pancreatic  . Stroke Maternal Grandmother   . Alcohol abuse Maternal Grandfather   . Heart disease Paternal Grandfather   . Anxiety disorder Sister   . Osteoporosis Sister   . Cancer Sister     skin  . Osteoporosis Sister   . Other Mother     tic deleauroux    History   Social History  . Marital Status: Married    Spouse Name: N/A    Number of Children: N/A  . Years  of Education: N/A   Occupational History  . Not on file.   Social History Main Topics  . Smoking status: Former Smoker    Quit date: 05/22/2007  . Smokeless tobacco: Never Used  . Alcohol Use: Yes     6 oz of wine daily  . Drug Use: No  . Sexually Active: Yes -- Female partner(s)   Other Topics Concern  . Not on file   Social History Narrative  . No narrative on file    Review of Systems:  All systems reviewed.  They are negative to the above problem except as previously stated.  Vital Signs: BP 100/70  Pulse 80  Ht 5\' 6"  (1.676 m)  Wt 128 lb (58.06 kg)  BMI 20.66 kg/m2  Physical Exam Patient is in NAD  HEENT:  Normocephalic, atraumatic. EOMI, PERRLA.  Neck: JVP is normal. No thyromegaly. No bruits.  Lungs: clear to auscultation. No rales no wheezes.  Heart: Regular rate and rhythm. Normal S1, S2. No S3.   No significant murmurs. PMI not displaced.  Abdomen:  Supple, nontender. Normal bowel sounds. No masses. No hepatomegaly.  Extremities:   Good distal pulses throughout. No lower extremity edema.  Musculoskeletal :moving all extremities.  Neuro:   alert and oriented x3.  CN II-XII grossly intact.  EKG:  NSR.  88 bpm.  RBBB.  Nonspecific ST T wave changes.   Assessment and Plan:

## 2011-05-04 ENCOUNTER — Telehealth: Payer: Self-pay | Admitting: Internal Medicine

## 2011-05-04 DIAGNOSIS — K222 Esophageal obstruction: Secondary | ICD-10-CM

## 2011-05-04 NOTE — Assessment & Plan Note (Signed)
Continues to be dependent bu volume down.

## 2011-05-04 NOTE — Telephone Encounter (Signed)
Left message for patient to call back  

## 2011-05-04 NOTE — Assessment & Plan Note (Addendum)
Patient says she is taking meds regularly.  Will continue to recheck lipids.

## 2011-05-04 NOTE — Telephone Encounter (Signed)
Patient had a EGD with dil in 2010 for a esophageal stricture and ring.  She reports an increase in dysphagia and foods sticking in her throat.  She is wanting to be seen.  Dr Leone Payor is it ok to set her up for a direct dil, but please review her chart and indicate if she will need propofol? She is advised until Dr  Leone Payor has reviewed she is to avoid steak, chicken, non-ground beef/meat, or bread.  She is stating she is having difficulty with her pills.  I have asked her to crush and take in applesauce all pills that are able to be crushed.  The patient verbalized understanding.

## 2011-05-07 NOTE — Telephone Encounter (Signed)
Ok to arrange for EGD/dilation (balloon) at hospital this week - moderate (tandard) sedation should be ok

## 2011-05-07 NOTE — Telephone Encounter (Signed)
Left message for patient to call back  

## 2011-05-08 NOTE — Telephone Encounter (Signed)
Left message for patient to call back  

## 2011-05-08 NOTE — Telephone Encounter (Signed)
Patient is scheduled for EGD with balloon dil [facility] 05/11/11 10:0.  I have advised her to be NPO after midnight and to arrive at 9:30.  The patient verbalized understanding.

## 2011-05-10 ENCOUNTER — Telehealth: Payer: Self-pay | Admitting: Family Medicine

## 2011-05-10 ENCOUNTER — Other Ambulatory Visit: Payer: Self-pay | Admitting: Family Medicine

## 2011-05-10 NOTE — Telephone Encounter (Signed)
Patient needs Rx for citalopram sent to [facility] in [place] [phone], she needs Rx changed to b.i.d.

## 2011-05-11 ENCOUNTER — Ambulatory Visit (HOSPITAL_COMMUNITY)
Admission: RE | Admit: 2011-05-11 | Discharge: 2011-05-11 | Disposition: A | Discharge status: Home / Self Care | Payer: Medicare HMO | Source: Ambulatory Visit | Attending: Internal Medicine | Admitting: Internal Medicine

## 2011-05-11 ENCOUNTER — Telehealth: Payer: Self-pay | Admitting: Internal Medicine

## 2011-05-11 ENCOUNTER — Other Ambulatory Visit: Payer: Self-pay | Admitting: Internal Medicine

## 2011-05-11 ENCOUNTER — Encounter: Payer: 59 | Admitting: Internal Medicine

## 2011-05-11 DIAGNOSIS — K222 Esophageal obstruction: Secondary | ICD-10-CM

## 2011-05-11 DIAGNOSIS — R1319 Other dysphagia: Secondary | ICD-10-CM

## 2011-05-11 DIAGNOSIS — R1310 Dysphagia, unspecified: Secondary | ICD-10-CM | POA: Insufficient documentation

## 2011-05-11 DIAGNOSIS — K200 Eosinophilic esophagitis: Secondary | ICD-10-CM | POA: Insufficient documentation

## 2011-05-11 MED ORDER — HYDROCODONE-ACETAMINOPHEN 5-500 MG PO CAPS: 1.0000 | ORAL_CAPSULE | ORAL | Status: AC | PRN

## 2011-05-11 MED ORDER — CITALOPRAM HYDROBROMIDE 10 MG PO TABS: 10.0000 mg | ORAL_TABLET | Freq: Two times a day (BID) | ORAL | Status: DC

## 2011-05-11 NOTE — Telephone Encounter (Signed)
OK to give a refill for same strength but change sig to 1 tab po bid prn anxiety/alcohol withdrawal # 40, no rf til I see her again and I can make sure she is not taking hi doses and continuing to drink excessively. Thanks

## 2011-05-11 NOTE — Telephone Encounter (Signed)
C/o sore throat - pain in pharynx but no chest pain or fever after esophageal dilation today. This is similar to what happened after 2010 esophageal dilation. After discussion with her will rx hydrocodone/APAP 5/500 mg 1-2 every 4 hrs prn - before meals She did not want viscous xylocaine due to possible "bad taste"  She was told to call back if fever, chest pain, persistent or worseming problems.

## 2011-05-11 NOTE — Telephone Encounter (Signed)
OK to call in Citalopram 10 mg po bid # 60 1 rf

## 2011-05-11 NOTE — Telephone Encounter (Signed)
Please advise 

## 2011-05-13 ENCOUNTER — Other Ambulatory Visit: Payer: Self-pay | Admitting: Emergency Medicine

## 2011-05-13 ENCOUNTER — Inpatient Hospital Stay (INDEPENDENT_AMBULATORY_CARE_PROVIDER_SITE_OTHER)
Admission: RE | Admit: 2011-05-13 | Discharge: 2011-05-13 | Disposition: A | Discharge status: Home / Self Care | Payer: 59 | Source: Ambulatory Visit | Attending: Emergency Medicine | Admitting: Emergency Medicine

## 2011-05-13 ENCOUNTER — Ambulatory Visit
Admission: RE | Admit: 2011-05-13 | Discharge: 2011-05-13 | Disposition: A | Discharge status: Home / Self Care | Payer: 59 | Source: Ambulatory Visit | Attending: Emergency Medicine | Admitting: Emergency Medicine

## 2011-05-13 ENCOUNTER — Encounter: Payer: Self-pay | Admitting: Emergency Medicine

## 2011-05-13 DIAGNOSIS — M25569 Pain in unspecified knee: Secondary | ICD-10-CM

## 2011-05-15 ENCOUNTER — Telehealth: Payer: Self-pay

## 2011-05-15 MED ORDER — FLUTICASONE PROPIONATE HFA 220 MCG/ACT IN AERO: INHALATION_SPRAY | RESPIRATORY_TRACT | Status: DC

## 2011-05-15 NOTE — Telephone Encounter (Signed)
Patient advised.  She needs to call back to schedule the 6 week follow up.  All questions about the diagnosis and how to properly take medications answered.

## 2011-05-15 NOTE — Progress Notes (Signed)
Quick Note:  She has eosinophilic esophagitis  Allergic disease - poorly understood as to cause but treatable with PPi and ingested fluticasone  She needs:   1) fluticasone inhaler 220 micrograms/puf, 2 puffs 2 times a day # 1 month supply with two refills DO NOT USE SPACER, SWALLOW THE PUFFS SIP A SMALL AMOUNT OF WATER AFTER THE PUFFS AND THEN NPO X 30 MINS, ALSO RINSE AND SPIT AFTER SWALLOW TO AVOID ORAL THRUSH 2) REV me 6 weeks or so will discuss further evaluation and reassess then 3) no egd recall    ______

## 2011-05-15 NOTE — Telephone Encounter (Signed)
Message copied by Annett Fabian on Tue May 15, 2011 10:02 AM ------      Message from: [name]      Created: Tue May 15, 2011  7:57 AM       She has eosinophilic esophagitis            Allergic disease - poorly understood as to cause but treatable with PPi and ingested fluticasone            She needs:             1) fluticasone inhaler 220 micrograms/puf, 2 puffs 2 times a day # 1 month supply with two refills      DO NOT USE SPACER, SWALLOW THE PUFFS SIP A SMALL AMOUNT OF WATER AFTER THE PUFFS AND THEN NPO X 30 MINS, ALSO RINSE AND SPIT AFTER SWALLOW TO AVOID ORAL THRUSH      2) REV me 6 weeks or so will discuss further evaluation and reassess then      3) no egd recall

## 2011-05-15 NOTE — Telephone Encounter (Signed)
I have left a message for the patient to call back to review  Results of the biopsy

## 2011-05-18 ENCOUNTER — Telehealth: Payer: Self-pay | Admitting: *Deleted

## 2011-05-18 ENCOUNTER — Other Ambulatory Visit: Payer: Self-pay | Admitting: Family Medicine

## 2011-05-18 MED ORDER — DIPHENOXYLATE-ATROPINE 2.5-0.025 MG PO TABS: 2.0000 | ORAL_TABLET | Freq: Four times a day (QID) | ORAL | Status: DC | PRN

## 2011-05-18 NOTE — Telephone Encounter (Signed)
Notified pt of advice below.  She stopped Celexa last night.  Pt is having more than one loose BM every 3 hours.  She states they are watery and the diarrhea and been "pure hell".  BM's are more frequent during the day, but she will still wake up every few hours during the night to have BM.

## 2011-05-18 NOTE — Telephone Encounter (Signed)
This sounds a bit extreme to be celexa side effects (plus she tolerated 10mg  celexa for at least a week without ANY side effects --per Dr. Mariel Aloe note 04/27/11).  The only way to try to see if it is celexa-related is to completely stop it for 4-5 days and see if the GI sx's go away.  I would rather avoid anti-diarrheal med during this time b/c it will make it difficult to tell if the cessation of celexa is making a difference or not.  However, if she's having a loose BM more frequently than one every 3 hours then I'll do lomotil rx.  Let me know.

## 2011-05-18 NOTE — Telephone Encounter (Signed)
Lomotil rx'd.  If GI sx's don't abate with cessation of celexa then she should come in for o/v.

## 2011-05-18 NOTE — Telephone Encounter (Signed)
Pt notified RX faxed to pharm and to call for appt if symptoms are not better.

## 2011-05-18 NOTE — Telephone Encounter (Signed)
RX faxed to pharmacy.

## 2011-05-18 NOTE — Telephone Encounter (Signed)
Pt states she has been having nausea and diarrhea day and night for 1 week.  She spoke with her [facility] nurse who advised her that Celexa can cause these side effects.  Pt has been taking Celexa for about 3 weeks now. She is not noticing any improvement of her symptoms on Celexa.  Pt is taking Immodium without any relief, pt would like something stronger.  Please advise.

## 2011-05-21 ENCOUNTER — Telehealth: Payer: Self-pay

## 2011-05-21 ENCOUNTER — Telehealth: Payer: Self-pay | Admitting: Internal Medicine

## 2011-05-21 DIAGNOSIS — R197 Diarrhea, unspecified: Secondary | ICD-10-CM

## 2011-05-21 NOTE — Telephone Encounter (Signed)
She is having loose to watery stools 3 or more a day with incontinence.  She will come in and have labs asap.

## 2011-05-21 NOTE — Telephone Encounter (Signed)
Find out how many stools/day Somebody rxed Lomotil? On 7/6 Does need stool for C diff PCR, fecal leukocytes

## 2011-05-21 NOTE — Telephone Encounter (Signed)
Left message for patient to call back  

## 2011-05-21 NOTE — Telephone Encounter (Signed)
Opened on accident

## 2011-05-21 NOTE — Telephone Encounter (Signed)
Has had daily diarrhea that started around the time of the EGD.  She is taking imodium daily but it is not controlling her symptoms.  No new antibiotics or sick contacts.  She has tried holding different meds with no success. Dr Leone Payor please advise if needs office visit or stool studies.

## 2011-05-22 ENCOUNTER — Ambulatory Visit (INDEPENDENT_AMBULATORY_CARE_PROVIDER_SITE_OTHER): Payer: 59 | Admitting: Family Medicine

## 2011-05-22 ENCOUNTER — Encounter: Payer: Self-pay | Admitting: Family Medicine

## 2011-05-22 ENCOUNTER — Other Ambulatory Visit (HOSPITAL_COMMUNITY)
Admission: RE | Admit: 2011-05-22 | Discharge: 2011-05-22 | Disposition: A | Discharge status: Home / Self Care | Payer: Medicare HMO | Source: Ambulatory Visit | Attending: Family Medicine | Admitting: Family Medicine

## 2011-05-22 DIAGNOSIS — F341 Dysthymic disorder: Secondary | ICD-10-CM

## 2011-05-22 DIAGNOSIS — R197 Diarrhea, unspecified: Secondary | ICD-10-CM

## 2011-05-22 DIAGNOSIS — Z113 Encounter for screening for infections with a predominantly sexual mode of transmission: Secondary | ICD-10-CM | POA: Insufficient documentation

## 2011-05-22 DIAGNOSIS — N760 Acute vaginitis: Secondary | ICD-10-CM | POA: Insufficient documentation

## 2011-05-22 DIAGNOSIS — F1010 Alcohol abuse, uncomplicated: Secondary | ICD-10-CM

## 2011-05-22 DIAGNOSIS — Z124 Encounter for screening for malignant neoplasm of cervix: Secondary | ICD-10-CM | POA: Insufficient documentation

## 2011-05-22 DIAGNOSIS — R000 Tachycardia, unspecified: Secondary | ICD-10-CM

## 2011-05-22 DIAGNOSIS — F419 Anxiety disorder, unspecified: Secondary | ICD-10-CM

## 2011-05-22 DIAGNOSIS — R3911 Hesitancy of micturition: Secondary | ICD-10-CM

## 2011-05-22 DIAGNOSIS — R319 Hematuria, unspecified: Secondary | ICD-10-CM

## 2011-05-22 DIAGNOSIS — K5900 Constipation, unspecified: Secondary | ICD-10-CM

## 2011-05-22 DIAGNOSIS — M7120 Synovial cyst of popliteal space [Baker], unspecified knee: Secondary | ICD-10-CM

## 2011-05-22 HISTORY — DX: Acute vaginitis: N76.0

## 2011-05-22 HISTORY — DX: Synovial cyst of popliteal space (Baker), unspecified knee: M71.20

## 2011-05-22 LAB — POCT URINALYSIS DIPSTICK
Bilirubin, UA: NEGATIVE
Glucose, UA: NEGATIVE
Ketones, UA: NEGATIVE
Nitrite, UA: NEGATIVE

## 2011-05-22 NOTE — Patient Instructions (Signed)
Chronic Diarrhea Diarrhea is loose, watery stools. Having diarrhea means passing loose stools 3 or more times a day. Diarrhea that lasts longer than 4 weeks is considered long-lasting (chronic). Symptoms of chronic diarrhea may be continual or may come and go. People of all ages can get diarrhea. Body fluid loss (dehydration) may occur as a result of diarrhea. This means the body does not have as many fluids and salts (electrolytes) as it needs. CAUSES There are many causes of chronic diarrhea. Causes may be different for children and adults. The various causes can be grouped into 2 categories: diarrhea caused by an infection and diarrhea not caused by an infection. Sometimes, the cause is unknown. Diarrhea caused by an infection may result from:  Parasites.   Bacteria.   Viral infections.  Diarrhea not caused by an infection may result from:  Irritable bowel syndrome.   Reaction to medicines, such as antibiotics, cancer drugs, blood pressure medicines, and antacids.   Intestinal disease (Crohn's disease, ulcerative colitis, celiac disease).   Food allergies or sensitivity to additives (fructose, lactose, sugar substitutes).   Tumors.   Diabetes, thyroid disease, and other endocrine diseases.   Reduced blood flow to the intestine.   Previous surgery or radiation of the abdomen or gastrointestinal tract.  Risk factors for chronic diarrhea include:  Having a severely weakened immune system, such as from HIV/AIDS.   Taking certain types of cancer-fighting drugs (chemotherapy) or other medicines.   A recent organ transplant.   Having a portion of the stomach removed.   Traveling to countries where food and water supplies are often contaminated.  SYMPTOMS In addition to frequent, loose stools, diarrhea may cause:  Cramping.   Abdominal pain.   Nausea.   Urgent need to use the bathroom, or loss of bowel control.  If dehydration occurs, problems include:  Thirst.   Less  frequent urination.   Dark urine.   Dry skin.   Fatigue.   Dizziness.  Infections that cause diarrhea may also cause a fever, chills, or bloody stools. DIAGNOSIS Diagnosis may be difficult. Your caregiver must take a careful history and perform a physical exam. Tests given are based on your symptoms and history. Tests may include:  Blood or stool tests, in which 3 or more stool samples may be examined. Stool cultures may be used to test for bacteria or parasites.   X-rays.   A procedure in which a thin tube is inserted into the mouth or rectum (endoscopy). This allows the caregiver to look inside the intestine.  TREATMENT  Diarrhea caused by an infection can often be treated with antibiotics.   Diarrhea not caused by an infection is more difficult to diagnose and treat. Long-term medicine use or surgery may be required. Specific treatment should be discussed with your caregiver.   If the cause cannot determined, treatment to relieve symptoms includes:   Preventing dehydration. Serious health problems can occur if you do not maintain proper fluid levels. Many oral rehydration solutions (ORS) are available at drug stores. Ask your caregiver what product is best for you.   Not drinking beverages that contain caffeine (tea, coffee, soft drinks).   Not drinking alcohol. It causes dehydration.   Not relying on sports drinks and broths alone to maintain proper fluid levels. They should not be used to prevent severe dehydration.   Maintaining well-balanced nutrition. This may help you recover faster.  PREVENTION  Drink clean or purified water.   Use proper food handling techniques.  Maintain proper hand-washing habits.  HOME CARE INSTRUCTIONS  Avoid:   Caffeine.   Greasy foods.   High fiber.   If you have problems digesting lactose during or after an episode of diarrhea, you might want to try yogurt. Yogurt is often better tolerated, because it has less lactose than milk.  Yogurt with active, live bacterial cultures may even help you recover faster.  SEEK MEDICAL CARE IF: The person with diarrhea is an otherwise healthy adult and has:  Signs of dehydration.   Diarrhea for more than 2 days.   Severe pain in the abdomen or rectum.   An oral temperature above 104.   Stools containing blood or pus.   Stools that are black and tarry.  SEEK IMMEDIATE MEDICAL CARE IF: The person with diarrhea is a child, elderly person, or has a weakened immune system and has:  Signs of dehydration.   Diarrhea for more than 1 day.   Severe pain in the abdomen or rectum.   An oral temperature above 104, not controlled by medicine.   Stools containing blood or pus.   Stools that are black and tarry.  Document Released: 01/19/2004 Document Re-Released: 04/18/2010 [facility] Patient Information 2011 [place], [place].  Try Benefiber or Citracel three times a day to help slow down the diarrhea

## 2011-05-22 NOTE — Assessment & Plan Note (Signed)
Previous history of BM every 3-4 days, now having several loose stool daily

## 2011-05-22 NOTE — Assessment & Plan Note (Signed)
Patient suffered a fall recently and was seen at an Urgent Care facility. She exacerbated a Baker's Cyst in her right knee and took pain meds for a couple days but the pain has improved so she has stopped th pain med again

## 2011-05-22 NOTE — Assessment & Plan Note (Signed)
Mild and improved today

## 2011-05-22 NOTE — Progress Notes (Signed)
Anne [name] [id number] [birth year] 05/22/2011      Progress Note-Follow Up  Subjective  Chief Complaint  Chief Complaint  Patient presents with  . Vaginitis    possible yeast infection  . Fall    pt fell last Saturday went to Urgent Care on Sunday and was told that pt had a bakers cyst on back of right knee    HPI  Patient is a 70 year old Caucasian female who is in today. Roughly 2 days ago she was leaving she noted some burning of the skin in the vaginal region since then she's noted some mild whitish to grayish vaginal discharge and is here today for evaluation. She denies fevers, chills, chest pain, abdominal pain, back pain. She has not had a Pap in 2 or more years she agrees to that today. Denies any history of abnormal Paps. Denies any history of GYN surgeries. G3 P3 status post 3 spontaneous vaginal deliveries. She has also noted some recent trouble with urinary hesitancy. When she starts going she has no trouble with dysuria or hematuria and she is denying increased frequency. She does have trouble over the last 3-4 weeks with worsening diarrhea. Historically truly doesn't form sometimes difficult to pass bowel movement every 3-4 days. Over the last 2 weeks she's had several loose stool daily although she denies ever seeing blood. In the last week or so she said she had episodes of incontinence overnight as a result but denies any bloody or tarry stool. She had a recent fall and was seen at an urgent care facility. A Baker's cyst in her right knee was exacerbated by this fall. She increased knee pain and to pain medications temporarily but has since stopped them and her pain is improving.  Past Medical History  Diagnosis Date  . Anemia   . Anxiety   . COPD (chronic obstructive pulmonary disease)   . Hyperlipidemia   . Pneumonia 2-10    pleurisy  . Arthritis   . Depression   . GERD (gastroesophageal reflux disease)     improved s/p cholecystectomy and esophagus dilatation    . History of chicken pox   . History of shingles     2 episodes  . History of measles   . Osteopenia 03/14/2011  . 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 Pure hypercholesterolemia 10/17/2010  . Constipation 03/15/2011  . 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 GERD 09/29/2009  . ESOPHAGEAL STRICTURE 03/29/2009  . CONSTIPATION 09/29/2009  . CAROTID ARTERY STENOSIS 01/13/2010  . ANXIETY 09/29/2009  . ALKALINE PHOSPHATASE, ELEVATED 03/15/2009  . Anxiety and depression 04/28/2011  . Vaginitis 05/22/2011  . Diarrhea 05/22/2011  . Baker's cyst of knee 05/22/2011    Past Surgical History  Procedure Date  . Anterior cruciate ligament repair 08, 09, 10  . Ercp, cbd stone extraction 2010  . Cholecystectomy 2010  . Egd/ dili 2010  . Breast surgery 2001, 2008    lumpectomy (01), Mastectomy modified radical (08), breast reconstruction, CA lesions excised lateral abd wall 2010    Family History  Problem Relation Age of Onset  . Heart disease Father   . Cancer Father     lung cancer/smoker  . Cirrhosis Sister     Primary Biliary  . Cancer Other     Breast, ovarian, pancreatic  . Stroke Maternal Grandmother   . Alcohol abuse Maternal Grandfather   . Heart disease Paternal Grandfather   . Anxiety disorder Sister   .  Osteoporosis Sister   . Cancer Sister     skin  . Osteoporosis Sister   . Other Mother     tic deleauroux    History   Social History  . Marital Status: Married    Spouse Name: N/A    Number of Children: N/A  . Years of Education: N/A   Occupational History  . Not on file.   Social History Main Topics  . Smoking status: Former Smoker    Quit date: 05/22/2007  . Smokeless tobacco: Never Used  . Alcohol Use: Yes     6 oz of wine daily  . Drug Use: No  . Sexually Active: Yes -- Female partner(s)   Other Topics Concern  . Not on file   Social History Narrative  . No narrative on file    Current Outpatient Prescriptions on File Prior to Visit   Medication Sig Dispense Refill  . amitriptyline (ELAVIL) 150 MG tablet Take 150 mg by mouth at bedtime.        [name] anastrozole (ARIMIDEX) 1 MG tablet Take 1 mg by mouth daily.        [name] aspirin 81 MG tablet Take 81 mg by mouth daily.        [name] atorvastatin (LIPITOR) 40 MG tablet Take 40 mg by mouth daily.       . beta carotene 78295 UNIT capsule Take 25,000 Units by mouth daily.        . Calcium Citrate (CITRACAL PO) Take 500 mg by mouth daily.        . Cholecalciferol (VITAMIN D3) 1000 UNITS CAPS Take 2 tablets by mouth daily.        . diphenoxylate-atropine (LOMOTIL) 2.5-0.025 MG per tablet Take 2 tablets by mouth 4 (four) times daily as needed.  30 tablet  1  . fluticasone (FLOVENT HFA) 220 MCG/ACT inhaler 2 puffs 2 times a day DO NOT USE SPACER, SWALLOW THE PUFFS SIP A SMALL AMOUNT OF WATER AFTER THE PUFFS AND THEN NPO X 30 MINS, ALSO RINSE AND SPIT AFTER SWALLOW TO AVOID ORAL THRUSH   1 Inhaler  2  . folic acid (FOLVITE) 1 MG tablet Take 1 tablet (1 mg total) by mouth daily.  30 tablet  11  . LORazepam (ATIVAN) 1 MG tablet take 1 tablet by mouth three times a day FOR NEXT WEEK then take 1-2 three times a day if needed for anxiety FROM WITHDRAWAL SYMPTOMS  90 tablet  0  . nebivolol (BYSTOLIC) 5 MG tablet 1/2 tab po daily prn BP>135/90, P>105  21 tablet  0  . NON FORMULARY Zinc oxide 50 mg       . omeprazole (PRILOSEC) 20 MG capsule Take 20 mg by mouth 2 (two) times daily.        . ondansetron (ZOFRAN) 8 MG tablet Take by mouth 2 (two) times daily.        [name] pyridOXINE (VITAMIN B-6) 50 MG tablet Take 50 mg by mouth 3 (three) times daily.        [name] thiamine 100 MG tablet Take 1 tablet (100 mg total) by mouth daily.  30 tablet  11  . vitamin B-12 (CYANOCOBALAMIN) 1000 MCG tablet Take 1,000 mcg by mouth daily.        [name] DISCONTD: citalopram (CELEXA) 10 MG tablet Take 1 tablet (10 mg total) by mouth 2 (two) times daily.  60 tablet  1  . hydrocodone-acetaminophen (LORCET-HD) 5-500 MG per capsule Take 1  capsule by  mouth every 4 (four) hours as needed for pain (1 hour before meals).  30 capsule  0  . DISCONTD: citalopram (CELEXA) 10 MG tablet Take 1 tablet (10 mg total) by mouth daily.  30 tablet  2    Allergies  Allergen Reactions  . Codeine   . Diphenhydramine Hcl   . Morphine     Review of Systems  Review of Systems  Constitutional: Negative for fever and malaise/fatigue.  HENT: Negative for congestion.   Eyes: Negative for discharge.  Respiratory: Negative for shortness of breath.   Cardiovascular: Negative for chest pain, palpitations and leg swelling.  Gastrointestinal: Negative for nausea, abdominal pain and diarrhea.  Genitourinary: Negative for dysuria.       Urinary hesitancy Vaginal discharge white to greenish G3P3 s/p 3 svd, no history of GYN surgeries or abnormal paps Menarche at 24 and menopause at 32  Musculoskeletal: Negative for falls.  Skin: Negative for rash.  Neurological: Negative for loss of consciousness and headaches.  Endo/Heme/Allergies: Negative for polydipsia.  Psychiatric/Behavioral: Negative for depression and suicidal ideas. The patient is not nervous/anxious and does not have insomnia.     Objective  BP 101/70  Pulse 92  Temp(Src) 98.5 F (36.9 C) (Oral)  Ht 5\' 6"  (1.676 m)  Wt 126 lb 1.9 oz (57.208 kg)  BMI 20.36 kg/m2  SpO2 97%  Physical Exam  Physical Exam  Genitourinary: Uterus normal, cervix normal, right adnexa normal and left adnexa normal. Vaginal discharge found.       Small amount clear vaginal discharge, patient with some atrophic changes in the labia with slight anterior and posterior adhesions and thinning of mucosal skin     Lab Results  Component Value Date   WBC 5.2 03/29/2011   HGB 12.1 03/29/2011   HCT 35.2* 03/29/2011   MCV 94.5 03/29/2011   PLT 235.0 03/29/2011   Lab Results  Component Value Date   CREATININE 0.69 03/07/2011   CREATININE 0.69 03/07/2011   BUN 22 03/07/2011   BUN 22 03/07/2011   NA 140  03/07/2011   NA 140 03/07/2011   K 3.9 03/07/2011   K 3.9 03/07/2011   CL 104 03/07/2011   CL 104 03/07/2011   CO2 28 03/07/2011   CO2 28 03/07/2011   Lab Results  Component Value Date   ALT 27 03/07/2011   ALT 27 03/07/2011   AST 25 03/20/2011   ALKPHOS 113 03/07/2011   ALKPHOS 113 03/07/2011   BILITOT 0.5 03/07/2011   BILITOT 0.5 03/07/2011   Lab Results  Component Value Date   CHOL 195 03/20/2011   Lab Results  Component Value Date   HDL 76.00 03/20/2011   Lab Results  Component Value Date   LDLCALC 94 03/20/2011   Lab Results  Component Value Date   TRIG 123.0 03/20/2011   Lab Results  Component Value Date   CHOLHDL 3 03/20/2011     Assessment & Plan  Diarrhea Has been struggling with loose, frequent stool over the past 3-4 weeks, will have several loose stool during the day and now she is also struggling with fecal incontinence with the diarrhea over night many nights a week. No bloody or tarry stool. Underwent endoscopy on June 29.  Anxiety and depression She stopped the Citalopram at the suggestion of her nurse from her insurance company to see  If that resolved her diarrhea but unfortunately it did not. She did go ahead and restart this and has not had any further trouble with  it but is unsure if it is helping. We will continue with the 10mg  dosing for now and reevaluate at next visit.  Baker's cyst of knee Patient suffered a fall recently and was seen at an Urgent Care facility. She exacerbated a Baker's Cyst in her right knee and took pain meds for a couple days but the pain has improved so she has stopped th pain med again  Alcohol abuse Patient continues to maintain a lower intake level and her vitals look better today. She reports drinking 2 oz of alcohol in the afternoon and 4 oz later in the evening. She limits herself to that amount despite wanting more. She is encouraged to continue her efforts at cessation  CONSTIPATION Previous history of BM every 3-4 days, now having  several loose stool daily  Tachycardia Mild and improved today  Hematuria Patient in today noting recent development of urinary hesitancy and urine dip revealed some hematuria, will send urine for culture  Vaginitis Patient in today complaining of several day history of some vaginal discharge creamy greenish looking at times. She looked "some mild burning of the mucosa when she was bathing. She denies any recent change in products. No abdominal or back pain. Like her Pap smear run and as well as some testing for vaginitis and STDs. Those are obtained today and we await results before treating. Push clear fluids

## 2011-05-22 NOTE — Assessment & Plan Note (Signed)
Has been struggling with loose, frequent stool over the past 3-4 weeks, will have several loose stool during the day and now she is also struggling with fecal incontinence with the diarrhea over night many nights a week. No bloody or tarry stool. Underwent endoscopy on June 29.

## 2011-05-22 NOTE — Assessment & Plan Note (Signed)
Patient in today noting recent development of urinary hesitancy and urine dip revealed some hematuria, will send urine for culture

## 2011-05-22 NOTE — Assessment & Plan Note (Addendum)
She stopped the Citalopram at the suggestion of her nurse from her insurance company to see  If that resolved her diarrhea but unfortunately it did not. She did go ahead and restart this and has not had any further trouble with it but is unsure if it is helping. We will continue with the 10mg  dosing for now and reevaluate at next visit.

## 2011-05-22 NOTE — Assessment & Plan Note (Signed)
Patient in today complaining of several day history of some vaginal discharge creamy greenish looking at times. She looked "some mild burning of the mucosa when she was bathing. She denies any recent change in products. No abdominal or back pain. Like her Pap smear run and as well as some testing for vaginitis and STDs. Those are obtained today and we await results before treating. Push clear fluids

## 2011-05-22 NOTE — Assessment & Plan Note (Signed)
Patient continues to maintain a lower intake level and her vitals look better today. She reports drinking 2 oz of alcohol in the afternoon and 4 oz later in the evening. She limits herself to that amount despite wanting more. She is encouraged to continue her efforts at cessation

## 2011-05-23 NOTE — Telephone Encounter (Signed)
FYI She went to PCP They are doing stool studies including C diff toxin (not PCR)

## 2011-05-30 ENCOUNTER — Telehealth: Payer: Self-pay

## 2011-05-30 NOTE — Telephone Encounter (Signed)
Pt hasn't had a BM since

## 2011-05-30 NOTE — Telephone Encounter (Signed)
Opened on accident

## 2011-06-13 ENCOUNTER — Ambulatory Visit (INDEPENDENT_AMBULATORY_CARE_PROVIDER_SITE_OTHER): Payer: 59 | Admitting: Family Medicine

## 2011-06-13 ENCOUNTER — Encounter: Payer: Self-pay | Admitting: Family Medicine

## 2011-06-13 DIAGNOSIS — F341 Dysthymic disorder: Secondary | ICD-10-CM

## 2011-06-13 DIAGNOSIS — F1010 Alcohol abuse, uncomplicated: Secondary | ICD-10-CM

## 2011-06-13 DIAGNOSIS — E7800 Pure hypercholesterolemia, unspecified: Secondary | ICD-10-CM

## 2011-06-13 DIAGNOSIS — F419 Anxiety disorder, unspecified: Secondary | ICD-10-CM

## 2011-06-13 DIAGNOSIS — F329 Major depressive disorder, single episode, unspecified: Secondary | ICD-10-CM

## 2011-06-13 DIAGNOSIS — R000 Tachycardia, unspecified: Secondary | ICD-10-CM

## 2011-06-13 DIAGNOSIS — N760 Acute vaginitis: Secondary | ICD-10-CM

## 2011-06-13 MED ORDER — CITALOPRAM HYDROBROMIDE 20 MG PO TABS: 20.0000 mg | ORAL_TABLET | Freq: Every day | ORAL | Status: DC

## 2011-06-13 NOTE — Patient Instructions (Signed)
Alcohol and Drug Use   When Is It Abuse? When Is It Addiction?  When alcohol and/or drug use interferes with normal living activities, it is called alcohol and/or drug abuse. Problems can be with family and friends, one's job, health issues, legal issues, financial and emotional problems. Mood swings are not uncommon. Depression may even develop and persist.  When abuse continues even with bad consequences, the substance abuse may have progressed to addiction. Addiction to alcohol is usually referred to as alcoholism. Addiction to other substances is often referred to as drug addiction. Chemical dependency also describes the condition where a person cannot control their alcohol and/or drug use.  CAUSES    The cause of alcoholism and drug addiction (chemical dependency) is unknown.    Research has found that having an alcoholic family member makes it more likely to have another family member become an alcoholic as well.    A person's risk for developing alcoholism or drug addiction can increase based on the person's environment. This includes:    Where and how he or she lives.   Family, friends, and culture.   Peer pressure.   How easy it is to get alcohol and drugs.    SYMPTOMS   A strong need or instinct to drink and/or use drugs (cravings).    Not able to limit drinking and/or drug-taking on any given occasion (loss of control).    Withdrawal symptoms such as nausea, sweating, shakiness and anxiety occur when alcohol and/or drug use is stopped after a period of heavy drinking and/or using (physical dependence).    The need to drink greater amounts of alcohol or take greater amounts of drugs in order to "get high" (tolerance).   DIAGNOSIS  The diagnosis is best made by a physician who specializes in addiction medicine or a licensed drug and alcohol specialist. However, it is possible to get an idea if you have a problem by answering the questions below.   Have you been told by friends or family that  drugs/alcohol has become a problem?    Do you get into fights when drinking or using drugs?    Do you not remember what you do while using (blackouts)?    Do you lie about use or amounts of drugs or alcohol you consume?    Do you need chemicals to get you going?    Do you suffer in work performance or school because of drug or alcohol use?    Do you get sick from use of drugs or alcohol, but keep using anyway?    Do you need drugs or alcohol to relate to people or feel comfortable in social situations?    Do you use drugs or alcohol to forget problems?   If you answered yes to any of the above questions, you show signs of chemical dependency. Professional evaluation is suggested. The longer you use drugs and alcohol, the greater the problems will become.  TREATMENT  Alcoholism/addiction has little to do with willpower. Alcoholics and addicts have an uncontrollable need for alcohol and/or drugs that overrides their ability to stop drinking or using. This need can be as strong as the need for food or water. Although some people are able to recover from alcoholism/addiction without help, most alcoholics and addicts need help. With treatment and support, many people are able to stop drinking and using drugs. Long term recovery is possible.   Addiction cannot be cured but it can be treated successfully. Most hospitals and clinics 

## 2011-06-13 NOTE — Assessment & Plan Note (Addendum)
Patient has started drinking more again, she is back up to a bottle a day again. She has been struggling with increased anxiety and especially in social situations. She does not feel as if the Citalopram has been helping with her anxiety or depression. She has been taking it until now and has not had any trouble with it but does not feel it has helped, she is agreeable to increasing the dosage of Citalopram and accepting a referral to [facility] for help with her substance abuse counseling. She will not allowed large doses of Lorazepam until she cuts down on her drinking some again

## 2011-06-13 NOTE — Assessment & Plan Note (Signed)
Tolerating Atorvastatin 

## 2011-06-14 NOTE — Assessment & Plan Note (Signed)
No concerns today 

## 2011-06-14 NOTE — Progress Notes (Signed)
Anne [name] [id number] [birth year] 06/14/2011      Progress Note-Follow Up  Subjective  Chief Complaint  Chief Complaint  Patient presents with  . Follow-up    3 month follow up    HPI  Patient is a 70 yo caucasian female who is in today with her husband for follow up on multiple medical problems. Unfortunately she has increased her wine consumption back up to a bottle a day again after getting her amount down to just 6 oz a day. She has been more anxious and has had a few social events recently that she did not do well at. She felt very out of place and as if no one else wanted her there. She has since increased her drinking more. She is taking the Citalopram and she does not report any concerning side effects but does not feel it has helped her mood. No recent illness, fevers, chills, CP, palp, SOB, GI or GU c/o.  Past Medical History  Diagnosis Date  . Anemia   . Anxiety   . COPD (chronic obstructive pulmonary disease)   . Hyperlipidemia   . Pneumonia 2-10    pleurisy  . Arthritis   . Depression   . GERD (gastroesophageal reflux disease)     improved s/p cholecystectomy and esophagus dilatation  . History of chicken pox   . History of shingles     2 episodes  . History of measles   . Osteopenia 03/14/2011  . 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 Pure hypercholesterolemia 10/17/2010  . Constipation 03/15/2011  . 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 GERD 09/29/2009  . ESOPHAGEAL STRICTURE 03/29/2009  . CONSTIPATION 09/29/2009  . CAROTID ARTERY STENOSIS 01/13/2010  . ANXIETY 09/29/2009  . ALKALINE PHOSPHATASE, ELEVATED 03/15/2009  . Anxiety and depression 04/28/2011  . Vaginitis 05/22/2011  . Diarrhea 05/22/2011  . Baker's cyst of knee 05/22/2011    Past Surgical History  Procedure Date  . Anterior cruciate ligament repair 08, 09, 10  . Ercp, cbd stone extraction 2010  . Cholecystectomy 2010  . Egd/ dili 2010  . Breast surgery 2001, 2008   lumpectomy (01), Mastectomy modified radical (08), breast reconstruction, CA lesions excised lateral abd wall 2010    Family History  Problem Relation Age of Onset  . Heart disease Father   . Cancer Father     lung cancer/smoker  . Cirrhosis Sister     Primary Biliary  . Cancer Other     Breast, ovarian, pancreatic  . Stroke Maternal Grandmother   . Alcohol abuse Maternal Grandfather   . Heart disease Paternal Grandfather   . Anxiety disorder Sister   . Osteoporosis Sister   . Cancer Sister     skin  . Osteoporosis Sister   . Other Mother     tic deleauroux    History   Social History  . Marital Status: Married    Spouse Name: N/A    Number of Children: N/A  . Years of Education: N/A   Occupational History  . Not on file.   Social History Main Topics  . Smoking status: Former Smoker    Quit date: 05/22/2007  . Smokeless tobacco: Never Used  . Alcohol Use: Yes     1/2 to 3/4 bottle sometimes 1 bottle  . Drug Use: No  . Sexually Active: Yes -- Female partner(s)   Other Topics Concern  . Not on file   Social History  Narrative  . No narrative on file    Current Outpatient Prescriptions on File Prior to Visit  Medication Sig Dispense Refill  . amitriptyline (ELAVIL) 150 MG tablet Take 150 mg by mouth at bedtime.        [name] anastrozole (ARIMIDEX) 1 MG tablet Take 1 mg by mouth daily.        [name] aspirin 81 MG tablet Take 81 mg by mouth daily.        [name] atorvastatin (LIPITOR) 40 MG tablet Take 40 mg by mouth daily.       . beta carotene 40981 UNIT capsule Take 25,000 Units by mouth daily.        . Calcium Citrate (CITRACAL PO) Take 500 mg by mouth daily.        . Cholecalciferol (VITAMIN D3) 1000 UNITS CAPS Take 2 tablets by mouth daily.        . fluticasone (FLOVENT HFA) 220 MCG/ACT inhaler 2 puffs 2 times a day DO NOT USE SPACER, SWALLOW THE PUFFS SIP A SMALL AMOUNT OF WATER AFTER THE PUFFS AND THEN NPO X 30 MINS, ALSO RINSE AND SPIT AFTER SWALLOW TO AVOID ORAL THRUSH    1 Inhaler  2  . folic acid (FOLVITE) 1 MG tablet Take 1 tablet (1 mg total) by mouth daily.  30 tablet  11  . LORazepam (ATIVAN) 1 MG tablet take 1 tablet by mouth three times a day FOR NEXT WEEK then take 1-2 three times a day if needed for anxiety FROM WITHDRAWAL SYMPTOMS  90 tablet  0  . nebivolol (BYSTOLIC) 5 MG tablet 1/2 tab po daily prn BP>135/90, P>105  21 tablet  0  . NON FORMULARY Zinc oxide 50 mg       . omeprazole (PRILOSEC) 20 MG capsule Take 20 mg by mouth 2 (two) times daily.        . ondansetron (ZOFRAN) 8 MG tablet Take by mouth 2 (two) times daily.        [name] pyridOXINE (VITAMIN B-6) 50 MG tablet Take 50 mg by mouth 3 (three) times daily.        [name] thiamine 100 MG tablet Take 1 tablet (100 mg total) by mouth daily.  30 tablet  11  . vitamin B-12 (CYANOCOBALAMIN) 1000 MCG tablet Take 1,000 mcg by mouth daily.          Allergies  Allergen Reactions  . Codeine   . Diphenhydramine Hcl   . Morphine     Review of Systems  Review of Systems  Constitutional: Negative for fever and malaise/fatigue.  HENT: Negative for congestion.   Eyes: Negative for discharge. Eye pain: none.  Respiratory: Negative for shortness of breath.   Cardiovascular: Negative for chest pain, palpitations and leg swelling.  Gastrointestinal: Negative for nausea, abdominal pain and diarrhea.  Genitourinary: Negative for dysuria.  Musculoskeletal: Negative for falls.  Skin: Negative for rash.  Neurological: Negative for loss of consciousness and headaches.  Endo/Heme/Allergies: Negative for polydipsia.  Psychiatric/Behavioral: Positive for depression, memory loss and substance abuse. Negative for suicidal ideas. The patient is nervous/anxious. The patient does not have insomnia.     Objective  BP 126/81  Pulse 96  Temp(Src) 98 F (36.7 C) (Oral)  Ht 5\' 6"  (1.676 m)  Wt 124 lb 12.8 oz (56.609 kg)  BMI 20.14 kg/m2  SpO2 99%  Physical Exam  Physical Exam  Constitutional: She is oriented to  person, place, and time and well-developed, well-nourished, and in no distress. No  distress.  HENT:  Head: Normocephalic and atraumatic.  Eyes: Conjunctivae are normal.  Neck: Neck supple. No thyromegaly present.  Cardiovascular: Normal rate, regular rhythm and normal heart sounds.   No murmur heard. Pulmonary/Chest: Effort normal and breath sounds normal. She has no wheezes.  Abdominal: She exhibits no distension and no mass.  Musculoskeletal: She exhibits no edema.  Lymphadenopathy:    She has no cervical adenopathy.  Neurological: She is alert and oriented to person, place, and time.  Skin: Skin is warm and dry. No rash noted. She is not diaphoretic.  Psychiatric: Memory, affect and judgment normal.     Lab Results  Component Value Date   WBC 5.2 03/29/2011   HGB 12.1 03/29/2011   HCT 35.2* 03/29/2011   MCV 94.5 03/29/2011   PLT 235.0 03/29/2011   Lab Results  Component Value Date   CREATININE 0.69 03/07/2011   CREATININE 0.69 03/07/2011   BUN 22 03/07/2011   BUN 22 03/07/2011   NA 140 03/07/2011   NA 140 03/07/2011   K 3.9 03/07/2011   K 3.9 03/07/2011   CL 104 03/07/2011   CL 104 03/07/2011   CO2 28 03/07/2011   CO2 28 03/07/2011   Lab Results  Component Value Date   ALT 27 03/07/2011   ALT 27 03/07/2011   AST 25 03/20/2011   ALKPHOS 113 03/07/2011   ALKPHOS 113 03/07/2011   BILITOT 0.5 03/07/2011   BILITOT 0.5 03/07/2011   Lab Results  Component Value Date   CHOL 195 03/20/2011   Lab Results  Component Value Date   HDL 76.00 03/20/2011   Lab Results  Component Value Date   LDLCALC 94 03/20/2011   Lab Results  Component Value Date   TRIG 123.0 03/20/2011   Lab Results  Component Value Date   CHOLHDL 3 03/20/2011     Assessment & Plan  Alcohol abuse Patient has started drinking more again, she is back up to a bottle a day again. She has been struggling with increased anxiety and especially in social situations. She does not feel as if the Citalopram has been helping with  her anxiety or depression. She has been taking it until now and has not had any trouble with it but does not feel it has helped, she is agreeable to increasing the dosage of Citalopram and accepting a referral to [facility] for help with her substance abuse counseling. She will not allowed large doses of Lorazepam until she cuts down on her drinking some again  PURE HYPERCHOLESTEROLEMIA Tolerating Atorvastatin   Anxiety and depression Agrees to an increase in Citalopram dose to 20mg  and we will minimize Lorazepam use until her alcohol consumption again  Tachycardia Mild today with increased alcohol consumption. She is encouraged to decrease her consumption again  Vaginitis No concerns today

## 2011-06-14 NOTE — Assessment & Plan Note (Signed)
Mild today with increased alcohol consumption. She is encouraged to decrease her consumption again

## 2011-06-14 NOTE — Assessment & Plan Note (Signed)
Agrees to an increase in Citalopram dose to 20mg  and we will minimize Lorazepam use until her alcohol consumption again

## 2011-06-19 ENCOUNTER — Telehealth: Payer: Self-pay | Admitting: Internal Medicine

## 2011-06-19 NOTE — Telephone Encounter (Signed)
Pt states she is constipated, she took MOM and had a bowel movement but has not had one since Sunday. Instructed pt to try Miralax that is available OTC. Pt verbalized understanding.

## 2011-06-26 ENCOUNTER — Other Ambulatory Visit: Payer: Self-pay | Admitting: Family Medicine

## 2011-06-26 ENCOUNTER — Telehealth: Payer: Self-pay | Admitting: Family Medicine

## 2011-06-26 NOTE — Telephone Encounter (Signed)
Is there anything else the pt can take besides Celexa? Pt states it made her sick on her stomach? FYI: Pt also states she is going to see Dr Noe Gens on Aug  23. Pt would also like her Lorazepam refilled. Pt states she is going to try to quit drinking again?  Please advise?

## 2011-06-26 NOTE — Telephone Encounter (Signed)
She can have a small amount of Lorazepam if she is still drinkgin, Can have same strength but change sig to 1 tab po bid prn anxiety or alcohol withdrawal and dispense only 40 tabs and no rf for now. She can start Sertraline 25 mg tab daily, #30, 1 rf to replace Citalopram

## 2011-06-26 NOTE — Telephone Encounter (Signed)
Please advise 

## 2011-06-26 NOTE — Telephone Encounter (Signed)
Patient will be home til 3:15, please call her about one of her meds that she needs a refill on

## 2011-06-26 NOTE — Telephone Encounter (Signed)
Patient can be reached on her cell this afternoon, please call ASAP

## 2011-06-27 ENCOUNTER — Ambulatory Visit: Payer: Medicare HMO | Admitting: Licensed Clinical Social Worker

## 2011-06-27 MED ORDER — LORAZEPAM 1 MG PO TABS: 1.0000 mg | ORAL_TABLET | Freq: Two times a day (BID) | ORAL | Status: DC | PRN

## 2011-06-27 MED ORDER — SERTRALINE HCL 25 MG PO TABS: 25.0000 mg | ORAL_TABLET | Freq: Every day | ORAL | Status: DC

## 2011-06-27 NOTE — Telephone Encounter (Signed)
Judeth Cornfield can you please print the Lorazepam for Dr Abner Greenspan to sign? I put everything in for the Sertraline and Lorazepam. It just needs to be printed and the Sertraline sent over.    Thank you

## 2011-06-27 NOTE — Telephone Encounter (Signed)
RX printed and sent.

## 2011-07-05 ENCOUNTER — Ambulatory Visit (INDEPENDENT_AMBULATORY_CARE_PROVIDER_SITE_OTHER): Payer: Medicare HMO | Admitting: Psychiatry

## 2011-07-05 ENCOUNTER — Ambulatory Visit: Payer: Medicare HMO | Admitting: Psychiatry

## 2011-07-05 DIAGNOSIS — F1010 Alcohol abuse, uncomplicated: Secondary | ICD-10-CM

## 2011-07-07 ENCOUNTER — Other Ambulatory Visit: Payer: Self-pay | Admitting: Internal Medicine

## 2011-07-12 ENCOUNTER — Ambulatory Visit: Payer: Medicare HMO | Admitting: Psychiatry

## 2011-07-12 ENCOUNTER — Other Ambulatory Visit: Payer: Self-pay | Admitting: Internal Medicine

## 2011-07-13 ENCOUNTER — Other Ambulatory Visit: Payer: Self-pay | Admitting: Family Medicine

## 2011-07-13 NOTE — Telephone Encounter (Signed)
Please advise? Pt was given 40 on 06-26-11??

## 2011-07-19 ENCOUNTER — Ambulatory Visit (INDEPENDENT_AMBULATORY_CARE_PROVIDER_SITE_OTHER): Payer: Medicare HMO | Admitting: Family Medicine

## 2011-07-19 ENCOUNTER — Encounter: Payer: Self-pay | Admitting: Family Medicine

## 2011-07-19 ENCOUNTER — Ambulatory Visit
Admission: RE | Admit: 2011-07-19 | Discharge: 2011-07-19 | Disposition: A | Discharge status: Home / Self Care | Payer: Medicare HMO | Source: Ambulatory Visit | Attending: Family Medicine | Admitting: Family Medicine

## 2011-07-19 ENCOUNTER — Ambulatory Visit: Payer: Medicare HMO | Admitting: Psychiatry

## 2011-07-19 ENCOUNTER — Other Ambulatory Visit: Payer: Self-pay | Admitting: Family Medicine

## 2011-07-19 DIAGNOSIS — F1010 Alcohol abuse, uncomplicated: Secondary | ICD-10-CM

## 2011-07-19 DIAGNOSIS — M25551 Pain in right hip: Secondary | ICD-10-CM

## 2011-07-19 DIAGNOSIS — L0291 Cutaneous abscess, unspecified: Secondary | ICD-10-CM

## 2011-07-19 DIAGNOSIS — M549 Dorsalgia, unspecified: Secondary | ICD-10-CM

## 2011-07-19 DIAGNOSIS — L0390 Cellulitis, unspecified: Secondary | ICD-10-CM

## 2011-07-19 DIAGNOSIS — Z853 Personal history of malignant neoplasm of breast: Secondary | ICD-10-CM

## 2011-07-19 DIAGNOSIS — M25559 Pain in unspecified hip: Secondary | ICD-10-CM

## 2011-07-19 MED ORDER — TRAMADOL HCL 50 MG PO TABS: 50.0000 mg | ORAL_TABLET | Freq: Three times a day (TID) | ORAL | Status: DC | PRN

## 2011-07-19 MED ORDER — LIDOCAINE 5 % EX PTCH: 1.0000 | MEDICATED_PATCH | Freq: Two times a day (BID) | CUTANEOUS | Status: DC

## 2011-07-19 MED ORDER — SULFAMETHOXAZOLE-TRIMETHOPRIM 800-160 MG PO TABS
1.0000 | ORAL_TABLET | Freq: Two times a day (BID) | ORAL | Status: AC
Start: 2011-07-19 — End: 2011-07-22

## 2011-07-19 NOTE — Patient Instructions (Signed)
Cellulitis Cellulitis is an infection of the skin and the tissue beneath it. The area is typically red and tender. It is caused by germs (bacteria) (usually staph or strep) that enter the body through cuts or sores. Cellulitis most commonly occurs in the arms or lower legs.  HOME CARE INSTRUCTIONS  If you are given a prescription for medications which kill germs (antibiotics), take as directed until finished.   If the infection is on the arm or leg, keep the limb elevated as able.   Use a warm cloth several times per day to relieve pain and encourage healing.   See your caregiver for recheck of the infected site in 102 or sooner if problems arise.   Only take over-the-counter or prescription medicines for pain, discomfort, or fever as directed by your caregiver.  SEEK MEDICAL CARE IF:  An oral temperature above 102 develops, not controlled by medication.   The area of redness (inflammation) is spreading, there are red streaks coming from the infected site, or if a part of the infection begins to turn dark in color.   The joint or bone underneath the infected skin becomes painful after the skin has healed.   The infection returns in the same or another area after it seems to have gone away.   A boil or bump swells up. This may be an abscess.   New, unexplained problems such as pain or fever develop.  SEEK IMMEDIATE MEDICAL CARE IF:  You or your child feels drowsy or lethargic.   There is vomiting, diarrhea, or lasting discomfort or feeling ill (malaise) with muscle aches and pains.  MAKE SURE YOU:   Understand these instructions.   Will watch your condition.   Will get help right away if you are not doing well or get worse.  Document Released: 08/08/2005 Document Re-Released: 08/26/2009 [facility] Patient Information 2011 [place], [place].Sacroiliac Joint Dysfunction The sacroiliac joint connects the lower part of the spine (the sacrum) with the bones of the  pelvis. CAUSES Sometimes, there is no obvious reason for sacroiliac joint dysfunction. Other times, it may occur   During pregnancy.   After injury, such as:   Car accidents.   Sport-related injuries.   Work-related injuries.   Due to one leg being shorter than the other.   Due to other conditions that affect the joints, such as:   Rheumatoid arthritis.   Gout.   Psoriasis.   Joint infection (septic arthritis).  SYMPTOMS  Symptoms may include:  Pain in the:   Lower back.   Buttocks.   Groin.   Thighs and legs.   Difficult sitting, standing, walking, lying, bending or lifting.  DIAGNOSIS A number of tests may be used to help diagnose the cause of sacroiliac joint dysfunction, including:  Imaging tests to look for other causes of pain, including:   MRI.   CT scan.   Bone scan.   Diagnostic injection: During a special x-ray (called fluoroscopy), a needle is put into the sacroiliac joint. A numbing medicine is injected into the joint. If the pain is improved or stopped, the diagnosis of sacroiliac joint dysfunction is more likely.  TREATMENT There are a number of types of treatment used for sacroiliac joint dysfunction, including:  Only take over-the-counter or prescription medicines for pain, discomfort, or fever as directed by your caregiver.   Medications to relax muscles.   Rest. Decreasing activity can help cut down on painful muscle spasms and allow the back to heal.   Application of  heat or ice to the lower back may improve muscle spasms and soothe pain.   Brace. A special back brace, called a sacroiliac belt, can help support the joint while your back is healing.   Physical therapy can help teach comfortable positions and exercises to strengthen muscles that support the sacroiliac joint.   Cortisone injections. Injections of steroid medicine into the joint can help decrease swelling and improve pain.   Hyaluronic acid injections. This chemical  improves lubrication within the sacroiliac joint, thereby decreasing pain.   Radiofrequency ablation. A special needle is placed into the joint, where it burns away nerves that are carrying pain messages from the joint.   Surgery. Because pain occurs during movement of the joint, screws and plates may be installed in order to limit or prevent joint motion.  HOME CARE INSTRUCTIONS  Take all medications exactly as directed.   Follow instructions regarding both rest and physical activity, to avoid worsening the pain.   Do physical therapy exercises exactly as prescribed.  SEEK IMMEDIATE MEDICAL CARE IF YOU:  Experience increasingly severe pain.   Develop new symptoms, such as numbness or tingling in your legs or feet.   Lose bladder or bowel control.  Document Released: 01/25/2009 Document Re-Released: 01/23/2010 [facility] Patient Information 2011 [place], [place].

## 2011-07-20 ENCOUNTER — Telehealth: Payer: Self-pay

## 2011-07-20 ENCOUNTER — Ambulatory Visit
Admission: RE | Admit: 2011-07-20 | Discharge: 2011-07-20 | Disposition: A | Discharge status: Home / Self Care | Payer: Medicare HMO | Source: Ambulatory Visit | Attending: Family Medicine | Admitting: Family Medicine

## 2011-07-20 DIAGNOSIS — M25551 Pain in right hip: Secondary | ICD-10-CM

## 2011-07-20 LAB — RENAL FUNCTION PANEL
Albumin: 3.9 g/dL (ref 3.5–5.2)
CO2: 27 mEq/L (ref 19–32)
Glucose, Bld: 88 mg/dL (ref 70–99)
Potassium: 4.2 mEq/L (ref 3.5–5.3)
Sodium: 137 mEq/L (ref 135–145)

## 2011-07-20 LAB — CBC WITH DIFFERENTIAL/PLATELET
Eosinophils Absolute: 0.1 10*3/uL (ref 0.0–0.7)
Hemoglobin: 10.9 g/dL — ABNORMAL LOW (ref 12.0–15.0)
Lymphs Abs: 1.9 10*3/uL (ref 0.7–4.0)
MCH: 30 pg (ref 26.0–34.0)
Monocytes Relative: 9 % (ref 3–12)
Neutrophils Relative %: 64 % (ref 43–77)
RBC: 3.63 MIL/uL — ABNORMAL LOW (ref 3.87–5.11)

## 2011-07-20 MED ORDER — FERROUS FUMARATE 325 (106 FE) MG PO TABS: 1.0000 | ORAL_TABLET | Freq: Every day | ORAL | Status: DC

## 2011-07-20 NOTE — Telephone Encounter (Signed)
FYI: Pts husband states they went to [facility] and waited 30 minutes with no one else there. Patient finally went and got xray then went out to car to leave and the guy came running out to inform pt they took xray of wrong hip. They then went back to the office and waited another 30 minutes so they left and are now going to the [facility] in [facility] today.

## 2011-07-23 ENCOUNTER — Telehealth: Payer: Self-pay

## 2011-07-23 ENCOUNTER — Encounter: Payer: Self-pay | Admitting: Family Medicine

## 2011-07-23 DIAGNOSIS — M25561 Pain in right knee: Secondary | ICD-10-CM | POA: Insufficient documentation

## 2011-07-23 HISTORY — DX: Pain in right knee: M25.561

## 2011-07-23 NOTE — Telephone Encounter (Signed)
May start back as tolerated, recommend for the first two days she walk 1/2 the distance she is used to and then increase as tolerated

## 2011-07-23 NOTE — Assessment & Plan Note (Signed)
Has cut back considerably again to just about 5 oz daily at the present time, she is encouraged in her efforts to keep cutting back and to see if she can quit completely.

## 2011-07-23 NOTE — Telephone Encounter (Signed)
Pt left a message stating she is feeling much better and has hip pain under control? Patient would like advice from MD on returning to her daily walking. Pt would just like to make sure this would be ok to start again? Please advise?

## 2011-07-23 NOTE — Assessment & Plan Note (Signed)
Patient recently had surgery on her right breast to remove a tissue expander and put in a smaller one. Today she is concerned because she is noting increased warmth and redness int he skin around the procedure and this symptoms just started today. No fevers, chills, malaise or increased pain. She is encouraged to contact her surgeon and she is started on a course of Bactrim to accompany her cephalosporin.

## 2011-07-23 NOTE — Telephone Encounter (Signed)
Patient informed. 

## 2011-07-23 NOTE — Progress Notes (Signed)
Anne [name] [id number] 06-30-1941 07/23/2011      Progress Note-Follow Up  Subjective  Chief Complaint  Chief Complaint  Patient presents with  . Fall    X 8 days- right buttocks    HPI  Patient is a 70 year old Caucasian female who is in today for evaluation status post fall. She reports tripping last week and falling on her right hip. She did not seek care at that time but stated his pain is persistent. Ecchymosis is improving and she denies any radicular symptoms or incontinence. She has pain over her posterior and lateral hip however it is not worsening but is not resolving. She denies any pain elsewhere. Did have some other bruising but that is largely resolved. She has cut her alcohol down to just 5 ounces daily her husband is with her and confirms. She says overall she actually feels improved. She did just have him purchase her expander in the right breast removed and a small one placed due to some ongoing pain. Today she is noting some increased erythema and discomfort and warmth in the skin. They have placed her on an antibiotic. She denies any malaise, myalgias, fevers, chills, chest pain, palpitations or GI complaints today  Past Medical History  Diagnosis Date  . Anemia   . Anxiety   . COPD (chronic obstructive pulmonary disease)   . Hyperlipidemia   . Pneumonia 2-10    pleurisy  . Arthritis   . Depression   . GERD (gastroesophageal reflux disease)     improved s/p cholecystectomy and esophagus dilatation  . History of chicken pox   . History of shingles     2 episodes  . History of measles   . Osteopenia 03/14/2011  . 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 Pure hypercholesterolemia 10/17/2010  . Constipation 03/15/2011  . 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 GERD 09/29/2009  . ESOPHAGEAL STRICTURE 03/29/2009  . CONSTIPATION 09/29/2009  . CAROTID ARTERY STENOSIS 01/13/2010  . ANXIETY 09/29/2009  . ALKALINE PHOSPHATASE, ELEVATED 03/15/2009  .  Anxiety and depression 04/28/2011  . Vaginitis 05/22/2011  . Diarrhea 05/22/2011  . Baker's cyst of knee 05/22/2011    Past Surgical History  Procedure Date  . Anterior cruciate ligament repair 08, 09, 10  . Ercp, cbd stone extraction 2010  . Cholecystectomy 2010  . Egd/ dili 2010  . Breast surgery 2001, 2008    lumpectomy (01), Mastectomy modified radical (08), breast reconstruction, CA lesions excised lateral abd wall 2010    Family History  Problem Relation Age of Onset  . Heart disease Father   . Cancer Father     lung cancer/smoker  . Cirrhosis Sister     Primary Biliary  . Cancer Other     Breast, ovarian, pancreatic  . Stroke Maternal Grandmother   . Alcohol abuse Maternal Grandfather   . Heart disease Paternal Grandfather   . Anxiety disorder Sister   . Osteoporosis Sister   . Cancer Sister     skin  . Osteoporosis Sister   . Other Mother     tic deleauroux    History   Social History  . Marital Status: Married    Spouse Name: N/A    Number of Children: N/A  . Years of Education: N/A   Occupational History  . Not on file.   Social History Main Topics  . Smoking status: Former Smoker    Types: Cigarettes    Quit  date: 05/22/2007  . Smokeless tobacco: Never Used  . Alcohol Use: Yes     5 oz daily  . Drug Use: No  . Sexually Active: Yes -- Female partner(s)   Other Topics Concern  . Not on file   Social History Narrative  . No narrative on file    Current Outpatient Prescriptions on File Prior to Visit  Medication Sig Dispense Refill  . amitriptyline (ELAVIL) 150 MG tablet Take 150 mg by mouth at bedtime.        [name] anastrozole (ARIMIDEX) 1 MG tablet Take 1 mg by mouth daily.        [name] aspirin 81 MG tablet Take 81 mg by mouth daily.        [name] atorvastatin (LIPITOR) 40 MG tablet take 1 tablet by mouth at bedtime  30 tablet  6  . beta carotene 96045 UNIT capsule Take 25,000 Units by mouth daily.        . Calcium Citrate (CITRACAL PO) Take 500 mg by  mouth daily.        . Cholecalciferol (VITAMIN D3) 1000 UNITS CAPS Take 2 tablets by mouth daily.        . fluticasone (FLOVENT HFA) 220 MCG/ACT inhaler 2 puffs 2 times a day DO NOT USE SPACER, SWALLOW THE PUFFS SIP A SMALL AMOUNT OF WATER AFTER THE PUFFS AND THEN NPO X 30 MINS, ALSO RINSE AND SPIT AFTER SWALLOW TO AVOID ORAL THRUSH   1 Inhaler  2  . folic acid (FOLVITE) 1 MG tablet Take 1 tablet (1 mg total) by mouth daily.  30 tablet  11  . LORazepam (ATIVAN) 1 MG tablet take 1 tablet by mouth twice a day if needed for anxiety  40 tablet  0  . nebivolol (BYSTOLIC) 5 MG tablet 1/2 tab po daily prn BP>135/90, P>105  21 tablet  0  . NON FORMULARY Zinc oxide 50 mg       . omeprazole (PRILOSEC) 20 MG capsule Take 20 mg by mouth 2 (two) times daily.        . ondansetron (ZOFRAN) 8 MG tablet Take by mouth 2 (two) times daily.        [name] pyridOXINE (VITAMIN B-6) 50 MG tablet Take 50 mg by mouth 3 (three) times daily.        . sertraline (ZOLOFT) 25 MG tablet Take 1 tablet (25 mg total) by mouth daily.  30 tablet  1  . thiamine 100 MG tablet Take 1 tablet (100 mg total) by mouth daily.  30 tablet  11  . vitamin B-12 (CYANOCOBALAMIN) 1000 MCG tablet Take 1,000 mcg by mouth daily.          Allergies  Allergen Reactions  . Codeine   . Diphenhydramine Hcl   . Morphine     Review of Systems  Review of Systems  Constitutional: Negative for fever and malaise/fatigue.  HENT: Negative for congestion.   Eyes: Negative for discharge.  Respiratory: Negative for shortness of breath.   Cardiovascular: Negative for chest pain, palpitations and leg swelling.  Gastrointestinal: Negative for nausea, abdominal pain and diarrhea.  Genitourinary: Negative for dysuria and flank pain.  Musculoskeletal: Positive for myalgias, joint pain and falls.       Larey Seat last week, right hip is ecchymotic and painful  Skin: Negative for rash.  Neurological: Negative for loss of consciousness and headaches.    Endo/Heme/Allergies: Negative for polydipsia.  Psychiatric/Behavioral: Negative for depression and suicidal ideas. The patient is not  nervous/anxious and does not have insomnia.     Objective  BP 113/79  Pulse 97  Temp(Src) 98.2 F (36.8 C) (Oral)  Ht 5\' 6"  (1.676 m)  Wt 128 lb (58.06 kg)  BMI 20.66 kg/m2  SpO2 97%  Physical Exam  Physical Exam  Constitutional: She is oriented to person, place, and time and well-developed, well-nourished, and in no distress. No distress.  HENT:  Head: Normocephalic and atraumatic.  Eyes: Conjunctivae are normal.  Neck: Neck supple. No thyromegaly present.  Cardiovascular: Normal rate, regular rhythm and normal heart sounds.   No murmur heard. Pulmonary/Chest: Effort normal and breath sounds normal. She has no wheezes.  Abdominal: She exhibits no distension and no mass.  Musculoskeletal: She exhibits no edema and no tenderness.       Mildly tender with palp over right posterior sacroiliac joint  Lymphadenopathy:    She has no cervical adenopathy.  Neurological: She is alert and oriented to person, place, and time. She has normal reflexes. Gait normal. Coordination normal. GCS score is 15.  Skin: Skin is warm and dry. No rash noted. She is not diaphoretic.  Psychiatric: Memory, affect and judgment normal.     Lab Results  Component Value Date   WBC 7.6 07/19/2011   HGB 10.9* 07/19/2011   HCT 34.3* 07/19/2011   MCV 94.5 07/19/2011   PLT 226 07/19/2011   Lab Results  Component Value Date   CREATININE 0.58 07/19/2011   BUN 13 07/19/2011   NA 137 07/19/2011   K 4.2 07/19/2011   CL 100 07/19/2011   CO2 27 07/19/2011   Lab Results  Component Value Date   ALT 27 03/07/2011   ALT 27 03/07/2011   AST 25 03/20/2011   ALKPHOS 113 03/07/2011   ALKPHOS 113 03/07/2011   BILITOT 0.5 03/07/2011   BILITOT 0.5 03/07/2011   Lab Results  Component Value Date   CHOL 195 03/20/2011   Lab Results  Component Value Date   HDL 76.00 03/20/2011   Lab Results  Component  Value Date   LDLCALC 94 03/20/2011   Lab Results  Component Value Date   TRIG 123.0 03/20/2011   Lab Results  Component Value Date   CHOLHDL 3 03/20/2011     Assessment & Plan  PERSONAL HX BREAST CANCER Patient recently had surgery on her right breast to remove a tissue expander and put in a smaller one. Today she is concerned because she is noting increased warmth and redness int he skin around the procedure and this symptoms just started today. No fevers, chills, malaise or increased pain. She is encouraged to contact her surgeon and she is started on a course of Bactrim to accompany her cephalosporin.  Alcohol abuse Has cut back considerably again to just about 5 oz daily at the present time, she is encouraged in her efforts to keep cutting back and to see if she can quit completely.   Hip pain She tripped and fell last week and struck her irght hip very hard. She has significant bruising but did not go have the hip xrayed. She is here today because pain is persistent. It is not worsening and there are no radicular symptoms or incontinence. She is sent for xray to confirm no fracture. And she may use Lidoderm patches and urged to use Hydrocodone sparingly for severe pain

## 2011-07-23 NOTE — Assessment & Plan Note (Addendum)
She tripped and fell last week and struck her irght hip very hard. She has significant bruising but did not go have the hip xrayed. She is here today because pain is persistent. It is not worsening and there are no radicular symptoms or incontinence. She is sent for xray to confirm no fracture. And she may use Lidoderm patches and urged to use Hydrocodone sparingly for severe pain

## 2011-07-29 ENCOUNTER — Other Ambulatory Visit: Payer: Self-pay | Admitting: Family Medicine

## 2011-07-30 NOTE — Telephone Encounter (Signed)
Please advise 

## 2011-08-02 LAB — POCT HEMOGLOBIN-HEMACUE: Operator id: 123881

## 2011-08-16 ENCOUNTER — Other Ambulatory Visit: Payer: Self-pay | Admitting: Family Medicine

## 2011-08-16 NOTE — Telephone Encounter (Signed)
Please advise 

## 2011-08-17 NOTE — Telephone Encounter (Signed)
RX faxed

## 2011-08-21 ENCOUNTER — Ambulatory Visit (INDEPENDENT_AMBULATORY_CARE_PROVIDER_SITE_OTHER): Payer: Medicare HMO | Admitting: Psychiatry

## 2011-08-21 DIAGNOSIS — F1010 Alcohol abuse, uncomplicated: Secondary | ICD-10-CM

## 2011-08-21 DIAGNOSIS — F0630 Mood disorder due to known physiological condition, unspecified: Secondary | ICD-10-CM

## 2011-08-27 ENCOUNTER — Encounter: Payer: Self-pay | Admitting: Family Medicine

## 2011-08-27 ENCOUNTER — Ambulatory Visit (INDEPENDENT_AMBULATORY_CARE_PROVIDER_SITE_OTHER): Payer: Medicare HMO | Admitting: Family Medicine

## 2011-08-27 VITALS — BP 94/66 | HR 89 | Temp 98.3°F | Ht 66.0 in | Wt 127.1 lb

## 2011-08-27 DIAGNOSIS — F3289 Other specified depressive episodes: Secondary | ICD-10-CM

## 2011-08-27 DIAGNOSIS — M25551 Pain in right hip: Secondary | ICD-10-CM

## 2011-08-27 DIAGNOSIS — F419 Anxiety disorder, unspecified: Secondary | ICD-10-CM

## 2011-08-27 DIAGNOSIS — F341 Dysthymic disorder: Secondary | ICD-10-CM

## 2011-08-27 DIAGNOSIS — M25559 Pain in unspecified hip: Secondary | ICD-10-CM

## 2011-08-27 DIAGNOSIS — Z23 Encounter for immunization: Secondary | ICD-10-CM

## 2011-08-27 DIAGNOSIS — F1010 Alcohol abuse, uncomplicated: Secondary | ICD-10-CM

## 2011-08-27 DIAGNOSIS — R000 Tachycardia, unspecified: Secondary | ICD-10-CM

## 2011-08-27 DIAGNOSIS — F329 Major depressive disorder, single episode, unspecified: Secondary | ICD-10-CM

## 2011-08-27 DIAGNOSIS — Z853 Personal history of malignant neoplasm of breast: Secondary | ICD-10-CM

## 2011-08-27 LAB — COMPREHENSIVE METABOLIC PANEL
ALT: 19
Albumin: 3.9
Alkaline Phosphatase: 105
BUN: 20
Chloride: 101
Glucose, Bld: 101 — ABNORMAL HIGH
Potassium: 3.4 — ABNORMAL LOW
Sodium: 141
Total Bilirubin: 0.3
Total Protein: 7.2

## 2011-08-27 LAB — URINE MICROSCOPIC-ADD ON

## 2011-08-27 LAB — CBC
HCT: 35 — ABNORMAL LOW
HCT: 38.2
Hemoglobin: 13.2
Platelets: 218
RDW: 13.1
RDW: 13.4
WBC: 8.6

## 2011-08-27 LAB — DIFFERENTIAL
Basophils Absolute: 0
Basophils Relative: 1
Eosinophils Absolute: 0.1
Monocytes Absolute: 0.6
Monocytes Relative: 7
Neutrophils Relative %: 63

## 2011-08-27 LAB — URINALYSIS, ROUTINE W REFLEX MICROSCOPIC
Bilirubin Urine: NEGATIVE
Hgb urine dipstick: NEGATIVE
Ketones, ur: NEGATIVE
Specific Gravity, Urine: 1.021
pH: 5.5

## 2011-08-27 MED ORDER — LIDOCAINE 5 % EX PTCH: 1.0000 | MEDICATED_PATCH | Freq: Two times a day (BID) | CUTANEOUS | Status: DC

## 2011-08-27 MED ORDER — SERTRALINE HCL 50 MG PO TABS: 50.0000 mg | ORAL_TABLET | Freq: Every day | ORAL | Status: DC

## 2011-08-27 NOTE — Patient Instructions (Addendum)
Hip Pain The hips join the upper legs to the lower pelvis. The bones, cartilage, tendons, and muscles of the hip joint perform a lot of work each day holding your body weight and allowing you to move around. Hip pain is a common symptom. It can range from a minor ache to severe pain on 1 or both hips. Pain may be felt on the inside of the hip joint near the groin, or the outside near the buttocks and upper thigh. There may be swelling or stiffness as well. It occurs more often when a person walks or performs activity. There are many reasons hip pain can develop. CAUSES It is important to work with your caregiver to identify the cause since many conditions can impact the bones, cartilage, muscles, and tendons of the hips. Causes for hip pain include:  Broken (fractured) bones.   Separation of the thighbone from the hip socket (dislocation).   Torn cartilage of the hip joint.   Swelling (inflammation) of a tendon (tendonitis), the sac within the hip joint (bursitis), or a joint.   A weakening in the abdominal wall (hernia), affecting the nerves to the hip.   Arthritis in the hip joint or lining of the hip joint.   Pinched nerves in the back, hip, or upper thigh.   A bulging disc in the spine (herniated disc).   Rarely, bone infection or cancer.  DIAGNOSIS The location of your hip pain will help your caregiver understand what may be causing the pain. A diagnosis is based on your medical history, your symptoms, results from your physical exam, and results from diagnostic tests. Diagnostic tests may include X-ray exams, a computerized magnetic scan (magnetic resonance imaging, MRI), or bone scan. TREATMENT Treatment will depend on the cause of your hip pain. Treatment may include:  Limiting activities and resting until symptoms improve.   Crutches or other walking supports (a cane or brace).   Ice, elevation, and compression.   Physical therapy or home exercises.   Shoe inserts or  special shoes.   Losing weight.   Medications to reduce pain.   Undergoing surgery.  HOME CARE INSTRUCTIONS  Only take over-the-counter or prescription medicines for pain, discomfort, or fever as directed by your caregiver.   Put ice on the injured area:   Put ice in a plastic bag.   Place a towel between your skin and the bag.   Leave the ice on for 15 minutes at a time, 3 times a day.   Keep your leg raised (elevated) when possible to lessen swelling.   Avoid activities that cause pain.   Follow specific exercises as directed by your caregiver.   Sleep with a pillow between your legs on your most comfortable side.   Record how often you have hip pain, the location of the pain, and what it feels like. This information may be helpful to you and your caregiver.   Ask your caregiver about returning to work or sports and whether you should drive.   Follow up with your caregiver for further exams, therapy, or testing as directed.  SEEK MEDICAL CARE IF:  Pain or swelling continues or worsens beyond 1 week.   You have an oral temperature above 102.   You are feeling unwell or have chills.   You are having an increasingly difficult time with walking.   You have a loss of sensation or other new symptoms.   You have questions or concerns.  SEEK IMMEDIATE MEDICAL CARE IF:  You cannot put weight on the affected hip.   You have fallen.   You have a sudden increase in pain and swelling in your hip.   You have an oral temperature above 102, not controlled by medicine.  MAKE SURE YOU:  Understand these instructions.   Will watch your condition.   Will get help right away if you are not doing well or get worse.  Document Released: 04/18/2010  [facility] Patient Information 2011 [place], [place].   Heat three times a day and apply Aspercreme 2 to 3 times a day, let me know if pain does not improve

## 2011-08-28 ENCOUNTER — Ambulatory Visit
Admission: RE | Admit: 2011-08-28 | Discharge: 2011-08-28 | Disposition: A | Discharge status: Home / Self Care | Payer: Medicare HMO | Source: Ambulatory Visit | Attending: Family Medicine | Admitting: Family Medicine

## 2011-08-28 DIAGNOSIS — M25551 Pain in right hip: Secondary | ICD-10-CM

## 2011-08-29 ENCOUNTER — Encounter: Payer: Self-pay | Admitting: Family Medicine

## 2011-08-29 NOTE — Progress Notes (Signed)
Anne [name] [id number] [birth year] 08/29/2011      Progress Note-Follow Up  Subjective  Chief Complaint  Chief Complaint  Patient presents with  . Fall    hurt right hip X 5 days    HPI  Patient is a 70 year old Caucasian female in today accompanied by her husband. She has had 2 falls since her last visit. The first was a couple of weeks with the [employer]. She had multiple abrasions and hurt her left knee but that has not resolved. Then 5 days ago she tripped on the carpet at home and fell on her right posterior hip. She is pain over the swollen area since then. She is able to bear weight and has only minimal radicular symptoms down the back of the right eye. He is uncomfortable when she has to put pressure on her lying on the hip. No numbness tingling weakness noted in either leg. No increased urinary or fecal incontinence. No fevers, chills, chest pain, palpitations, shortness of breath. She continues to take her vitamins. Patient denies but has reports she has increased her alcohol consumption somewhat. She is requesting an increase in her sertraline and she does acknowledge that her anxiety and depression are worsening again. No suicidal ideation.  Past Medical History  Diagnosis Date  . Anemia   . Anxiety   . COPD (chronic obstructive pulmonary disease)   . Hyperlipidemia   . Pneumonia 2-10    pleurisy  . Arthritis   . Depression   . GERD (gastroesophageal reflux disease)     improved s/p cholecystectomy and esophagus dilatation  . History of chicken pox   . History of shingles     2 episodes  . History of measles   . Osteopenia 03/14/2011  . 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 Pure hypercholesterolemia 10/17/2010  . Constipation 03/15/2011  . 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 GERD 09/29/2009  . ESOPHAGEAL STRICTURE 03/29/2009  . CONSTIPATION 09/29/2009  . CAROTID ARTERY STENOSIS 01/13/2010  . ANXIETY 09/29/2009  . ALKALINE PHOSPHATASE,  ELEVATED 03/15/2009  . Anxiety and depression 04/28/2011  . Vaginitis 05/22/2011  . Diarrhea 05/22/2011  . Baker's cyst of knee 05/22/2011    Past Surgical History  Procedure Date  . Anterior cruciate ligament repair 08, 09, 10  . Ercp, cbd stone extraction 2010  . Cholecystectomy 2010  . Egd/ dili 2010  . Breast surgery 2001, 2008    lumpectomy (01), Mastectomy modified radical (08), breast reconstruction, CA lesions excised lateral abd wall 2010    Family History  Problem Relation Age of Onset  . Heart disease Father   . Cancer Father     lung cancer/smoker  . Cirrhosis Sister     Primary Biliary  . Cancer Other     Breast, ovarian, pancreatic  . Stroke Maternal Grandmother   . Alcohol abuse Maternal Grandfather   . Heart disease Paternal Grandfather   . Anxiety disorder Sister   . Osteoporosis Sister   . Cancer Sister     skin  . Osteoporosis Sister   . Other Mother     tic deleauroux    History   Social History  . Marital Status: Married    Spouse Name: N/A    Number of Children: N/A  . Years of Education: N/A   Occupational History  . Not on file.   Social History Main Topics  . Smoking status: Former Smoker    Types:  Cigarettes    Quit date: 05/22/2007  . Smokeless tobacco: Never Used  . Alcohol Use: Yes     1 bottle daily  . Drug Use: No  . Sexually Active: Yes -- Female partner(s)   Other Topics Concern  . Not on file   Social History Narrative  . No narrative on file    Current Outpatient Prescriptions on File Prior to Visit  Medication Sig Dispense Refill  . amitriptyline (ELAVIL) 150 MG tablet Take 150 mg by mouth at bedtime.        [name] anastrozole (ARIMIDEX) 1 MG tablet Take 1 mg by mouth daily.        [name] aspirin 81 MG tablet Take 81 mg by mouth daily.        [name] atorvastatin (LIPITOR) 40 MG tablet take 1 tablet by mouth at bedtime  30 tablet  6  . beta carotene 14782 UNIT capsule Take 25,000 Units by mouth daily.        . Calcium Citrate  (CITRACAL PO) Take 500 mg by mouth daily.        . cefadroxil (DURICEF) 500 MG capsule Take 500 mg by mouth 2 (two) times daily.        . Cholecalciferol (VITAMIN D3) 1000 UNITS CAPS Take 2 tablets by mouth daily.        . ferrous fumarate (HEMOCYTE - 106 MG FE) 325 (106 FE) MG TABS Take 1 tablet (106 mg of iron total) by mouth daily.  30 each  1  . fluticasone (FLOVENT HFA) 220 MCG/ACT inhaler 2 puffs 2 times a day DO NOT USE SPACER, SWALLOW THE PUFFS SIP A SMALL AMOUNT OF WATER AFTER THE PUFFS AND THEN NPO X 30 MINS, ALSO RINSE AND SPIT AFTER SWALLOW TO AVOID ORAL THRUSH   1 Inhaler  2  . folic acid (FOLVITE) 1 MG tablet Take 1 tablet (1 mg total) by mouth daily.  30 tablet  11  . HYDROcodone-acetaminophen (VICODIN) 5-500 MG per tablet Take 1 tablet by mouth every 6 (six) hours as needed.        [name] LORazepam (ATIVAN) 1 MG tablet take 1 tablet by mouth twice a day if needed for anxiety  40 tablet  0  . nebivolol (BYSTOLIC) 5 MG tablet 1/2 tab po daily prn BP>135/90, P>105  21 tablet  0  . NON FORMULARY Zinc oxide 50 mg       . omeprazole (PRILOSEC) 20 MG capsule Take 20 mg by mouth 2 (two) times daily.        . ondansetron (ZOFRAN) 8 MG tablet Take by mouth 2 (two) times daily.        [name] pyridOXINE (VITAMIN B-6) 50 MG tablet Take 50 mg by mouth 3 (three) times daily.        [name] thiamine 100 MG tablet Take 1 tablet (100 mg total) by mouth daily.  30 tablet  11  . traMADol (ULTRAM) 50 MG tablet Take 1 tablet (50 mg total) by mouth every 8 (eight) hours as needed for pain.  30 tablet  1  . vitamin B-12 (CYANOCOBALAMIN) 1000 MCG tablet Take 1,000 mcg by mouth daily.          Allergies  Allergen Reactions  . Codeine   . Diphenhydramine Hcl   . Morphine     Review of Systems  Review of Systems  Constitutional: Negative for fever, chills and malaise/fatigue.  HENT: Negative for congestion.   Eyes: Negative for discharge.  Respiratory:  Negative for cough and shortness of breath.     Cardiovascular: Negative for chest pain, palpitations and leg swelling.  Gastrointestinal: Negative for nausea, abdominal pain and diarrhea.  Genitourinary: Negative for dysuria.  Musculoskeletal: Positive for joint pain and falls.       Right hip pain, some radiation at times down back of thigh  Skin: Negative for rash.  Neurological: Negative for tremors, focal weakness, seizures, loss of consciousness and headaches.  Endo/Heme/Allergies: Negative for polydipsia.  Psychiatric/Behavioral: Positive for depression. Negative for suicidal ideas. The patient is nervous/anxious. The patient does not have insomnia.     Objective  BP 94/66  Pulse 89  Temp(Src) 98.3 F (36.8 C) (Oral)  Ht 5\' 6"  (1.676 m)  Wt 127 lb 1.9 oz (57.661 kg)  BMI 20.52 kg/m2  SpO2 98%  Physical Exam  Physical Exam  Constitutional: She is oriented to person, place, and time and well-developed, well-nourished, and in no distress. No distress.  HENT:  Head: Normocephalic and atraumatic.  Eyes: Conjunctivae are normal.  Neck: Neck supple. No thyromegaly present.  Cardiovascular: Normal rate, regular rhythm and normal heart sounds.   No murmur heard. Pulmonary/Chest: Effort normal and breath sounds normal. She has no wheezes.  Abdominal: She exhibits no distension and no mass.  Musculoskeletal: Normal range of motion. She exhibits tenderness. She exhibits no edema.       Pain with palp over swelling on top of right Gluteal muscle. No fluctuance or erythema, firm. No pain with passive internal or external rotation of hip  Lymphadenopathy:    She has no cervical adenopathy.  Neurological: She is alert and oriented to person, place, and time.  Skin: Skin is warm and dry. No rash noted. She is not diaphoretic.  Psychiatric: Memory, affect and judgment normal.     Lab Results  Component Value Date   WBC 7.6 07/19/2011   HGB 10.9* 07/19/2011   HCT 34.3* 07/19/2011   MCV 94.5 07/19/2011   PLT 226 07/19/2011   Lab  Results  Component Value Date   CREATININE 0.58 07/19/2011   BUN 13 07/19/2011   NA 137 07/19/2011   K 4.2 07/19/2011   CL 100 07/19/2011   CO2 27 07/19/2011   Lab Results  Component Value Date   ALT 27 03/07/2011   ALT 27 03/07/2011   AST 25 03/20/2011   ALKPHOS 113 03/07/2011   ALKPHOS 113 03/07/2011   BILITOT 0.5 03/07/2011   BILITOT 0.5 03/07/2011   Lab Results  Component Value Date   CHOL 195 03/20/2011   Lab Results  Component Value Date   HDL 76.00 03/20/2011   Lab Results  Component Value Date   LDLCALC 94 03/20/2011   Lab Results  Component Value Date   TRIG 123.0 03/20/2011   Lab Results  Component Value Date   CHOLHDL 3 03/20/2011     Assessment & Plan  Alcohol abuse Unfortunately patient continues to drink and her husband is with her today and is concerned that her amount has gone up again. She is once again encouraged to cut back as much as possible  Hip pain Fell 5 days ago onto her right hip and has persistent pain. She has swelling over her posterior hip and tenderness with palpation. No redness or erythema. She is able to walk and bear weight but when she lies on the spot which is swollen it is painful. She's had 2 falls since her last visit. The first fall was at [facility] and  Lopid and some abrasions and some left knee discomfort but that has resolved. The hip pain is more concerning to her. She has not tried any significant pain medicine so far. We will x-ray the hip today for precaution. Exam does not suggest any fracture. There is some swelling and firmness under the skin over the gluteal muscle. Consistent with a contusion. Patient may apply heat at this time. Apply Aspercreme when necessary. Stay active and uses Lidoderm patches when necessary if this is inadequate may use tramadol when necessary for pain relief during the day and Advil very sparingly as needed.  Tachycardia Adequately controlled today  PERSONAL HX BREAST CANCER Lesion well healed. No concerns  today.  Anxiety and depression Patient ready to increase her Sertraline to 50 mg daily at this time due to worsening symptoms. No suicidal ideation, icnreae approved today

## 2011-08-29 NOTE — Assessment & Plan Note (Signed)
Adequately controlled today 

## 2011-08-29 NOTE — Assessment & Plan Note (Signed)
Lesion well healed. No concerns today.

## 2011-08-29 NOTE — Assessment & Plan Note (Signed)
Patient ready to increase her Sertraline to 50 mg daily at this time due to worsening symptoms. No suicidal ideation, icnreae approved today

## 2011-08-29 NOTE — Assessment & Plan Note (Signed)
Unfortunately patient continues to drink and her husband is with her today and is concerned that her amount has gone up again. She is once again encouraged to cut back as much as possible

## 2011-08-29 NOTE — Assessment & Plan Note (Signed)
Fell 5 days ago onto her right hip and has persistent pain. She has swelling over her posterior hip and tenderness with palpation. No redness or erythema. She is able to walk and bear weight but when she lies on the spot which is swollen it is painful. She's had 2 falls since her last visit. The first fall was at [facility] and Lopid and some abrasions and some left knee discomfort but that has resolved. The hip pain is more concerning to her. She has not tried any significant pain medicine so far. We will x-ray the hip today for precaution. Exam does not suggest any fracture. There is some swelling and firmness under the skin over the gluteal muscle. Consistent with a contusion. Patient may apply heat at this time. Apply Aspercreme when necessary. Stay active and uses Lidoderm patches when necessary if this is inadequate may use tramadol when necessary for pain relief during the day and Advil very sparingly as needed.

## 2011-08-30 ENCOUNTER — Other Ambulatory Visit: Payer: Self-pay | Admitting: Family Medicine

## 2011-08-30 NOTE — Telephone Encounter (Signed)
Please advise 

## 2011-09-06 ENCOUNTER — Other Ambulatory Visit: Payer: Self-pay | Admitting: Internal Medicine

## 2011-09-07 ENCOUNTER — Other Ambulatory Visit: Payer: Self-pay | Admitting: Oncology

## 2011-09-07 ENCOUNTER — Encounter (HOSPITAL_BASED_OUTPATIENT_CLINIC_OR_DEPARTMENT_OTHER): Payer: Medicare HMO | Admitting: Oncology

## 2011-09-07 ENCOUNTER — Telehealth: Payer: Self-pay | Admitting: Oncology

## 2011-09-07 DIAGNOSIS — C50419 Malignant neoplasm of upper-outer quadrant of unspecified female breast: Secondary | ICD-10-CM

## 2011-09-07 DIAGNOSIS — Z170 Estrogen receptor positive status [ER+]: Secondary | ICD-10-CM

## 2011-09-07 DIAGNOSIS — Z1231 Encounter for screening mammogram for malignant neoplasm of breast: Secondary | ICD-10-CM

## 2011-09-07 DIAGNOSIS — M899 Disorder of bone, unspecified: Secondary | ICD-10-CM

## 2011-09-07 DIAGNOSIS — C50919 Malignant neoplasm of unspecified site of unspecified female breast: Secondary | ICD-10-CM

## 2011-09-07 DIAGNOSIS — Z79811 Long term (current) use of aromatase inhibitors: Secondary | ICD-10-CM

## 2011-09-07 LAB — CBC WITH DIFFERENTIAL/PLATELET
Basophils Absolute: 0 10*3/uL (ref 0.0–0.1)
EOS%: 2.2 % (ref 0.0–7.0)
HGB: 11.3 g/dL — ABNORMAL LOW (ref 11.6–15.9)
MCH: 31.5 pg (ref 25.1–34.0)
MONO#: 0.4 10*3/uL (ref 0.1–0.9)
NEUT#: 3 10*3/uL (ref 1.5–6.5)
RDW: 15.5 % — ABNORMAL HIGH (ref 11.2–14.5)
WBC: 4.8 10*3/uL (ref 3.9–10.3)
lymph#: 1.3 10*3/uL (ref 0.9–3.3)

## 2011-09-07 LAB — COMPREHENSIVE METABOLIC PANEL
ALT: 26 U/L (ref 0–35)
AST: 26 U/L (ref 0–37)
Albumin: 4.3 g/dL (ref 3.5–5.2)
BUN: 11 mg/dL (ref 6–23)
Calcium: 9.3 mg/dL (ref 8.4–10.5)
Chloride: 105 mEq/L (ref 96–112)
Potassium: 4.2 mEq/L (ref 3.5–5.3)

## 2011-09-07 MED ORDER — OMEPRAZOLE 20 MG PO CPDR: 20.0000 mg | DELAYED_RELEASE_CAPSULE | Freq: Two times a day (BID) | ORAL | Status: DC

## 2011-09-07 NOTE — Telephone Encounter (Signed)
Patient called requesting a refill on Omeprazole. #[phone] with no refills sent to [facility] with a note that patient needs an appointment. According to 05/11/11 Orders only note, patient was supposed to schedule a return office visit but never scheduled one.

## 2011-09-07 NOTE — Telephone Encounter (Signed)
Pt has an appt for [facility] on 2/11/ 12  12noon.Pt aware of appt. She also has an apptf or Bone density @ [facility] on 11/212  1245, pt aware of appt.

## 2011-09-10 ENCOUNTER — Telehealth: Payer: Self-pay

## 2011-09-10 NOTE — Telephone Encounter (Signed)
Informed patients husband that Anne [name] has denied payment for the Lidoderm is not going to get paid for

## 2011-09-12 ENCOUNTER — Other Ambulatory Visit: Payer: Self-pay | Admitting: Family Medicine

## 2011-09-12 ENCOUNTER — Ambulatory Visit (INDEPENDENT_AMBULATORY_CARE_PROVIDER_SITE_OTHER): Payer: Medicare HMO | Admitting: Family Medicine

## 2011-09-12 ENCOUNTER — Encounter: Payer: Self-pay | Admitting: Family Medicine

## 2011-09-12 DIAGNOSIS — R197 Diarrhea, unspecified: Secondary | ICD-10-CM

## 2011-09-12 DIAGNOSIS — M549 Dorsalgia, unspecified: Secondary | ICD-10-CM

## 2011-09-12 DIAGNOSIS — F1010 Alcohol abuse, uncomplicated: Secondary | ICD-10-CM

## 2011-09-12 DIAGNOSIS — R05 Cough: Secondary | ICD-10-CM

## 2011-09-12 DIAGNOSIS — R059 Cough, unspecified: Secondary | ICD-10-CM

## 2011-09-12 DIAGNOSIS — M791 Myalgia, unspecified site: Secondary | ICD-10-CM

## 2011-09-12 DIAGNOSIS — IMO0001 Reserved for inherently not codable concepts without codable children: Secondary | ICD-10-CM

## 2011-09-12 DIAGNOSIS — B9789 Other viral agents as the cause of diseases classified elsewhere: Secondary | ICD-10-CM

## 2011-09-12 DIAGNOSIS — B349 Viral infection, unspecified: Secondary | ICD-10-CM

## 2011-09-12 LAB — POCT URINALYSIS DIPSTICK
Blood, UA: NEGATIVE
Glucose, UA: NEGATIVE
Nitrite, UA: NEGATIVE
Urobilinogen, UA: 0.2
pH, UA: 5.5

## 2011-09-12 MED ORDER — BENZONATATE 100 MG PO CAPS: 100.0000 mg | ORAL_CAPSULE | Freq: Three times a day (TID) | ORAL | Status: AC | PRN

## 2011-09-12 NOTE — Patient Instructions (Signed)

## 2011-09-13 LAB — CBC
MCH: 30.5 pg (ref 26.0–34.0)
MCHC: 31.9 g/dL (ref 30.0–36.0)
Platelets: 271 10*3/uL (ref 150–400)
RDW: 14.9 % (ref 11.5–15.5)

## 2011-09-13 LAB — SEDIMENTATION RATE: Sed Rate: 30 mm/hr — ABNORMAL HIGH (ref 0–22)

## 2011-09-13 LAB — BASIC METABOLIC PANEL
Calcium: 9.3 mg/dL (ref 8.4–10.5)
Creat: 0.71 mg/dL (ref 0.50–1.10)

## 2011-09-13 LAB — HEPATIC FUNCTION PANEL
Bilirubin, Direct: 0.1 mg/dL (ref 0.0–0.3)
Indirect Bilirubin: 0.2 mg/dL (ref 0.0–0.9)

## 2011-09-17 ENCOUNTER — Other Ambulatory Visit: Payer: Self-pay | Admitting: Family Medicine

## 2011-09-18 ENCOUNTER — Encounter: Payer: Self-pay | Admitting: Family Medicine

## 2011-09-18 DIAGNOSIS — B349 Viral infection, unspecified: Secondary | ICD-10-CM | POA: Insufficient documentation

## 2011-09-18 NOTE — Progress Notes (Signed)
Anne [name] [id number] 10-11-1941 09/18/2011      Progress Note-Follow Up  Subjective  Chief Complaint  Chief Complaint  Patient presents with  . Cough    started day  . Headache    X 4 days    HPI  Patient is in today c/o roughly 4 days of malaise, fatigue, dyspepsia, cough, congestion, diarrhea. She had been feeling well and then on Saturday night she ate increased amount of rich food at a party and increased her alcohol consumption. She had large amount of diarrhea the next morning and the other symptoms noted. The diarrhea has calmed down. The other symptoms have persisted. She has cut back on her alcohol again, has not made any other changes. No fevers, chills, rhinorrhea, CP, palp, SOB  Past Medical History  Diagnosis Date  . Anemia   . Anxiety   . COPD (chronic obstructive pulmonary disease)   . Hyperlipidemia   . Pneumonia 2-10    pleurisy  . Arthritis   . Depression   . GERD (gastroesophageal reflux disease)     improved s/p cholecystectomy and esophagus dilatation  . History of chicken pox   . History of shingles     2 episodes  . History of measles   . Osteopenia 03/14/2011  . 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 Pure hypercholesterolemia 10/17/2010  . Constipation 03/15/2011  . 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 GERD 09/29/2009  . ESOPHAGEAL STRICTURE 03/29/2009  . CONSTIPATION 09/29/2009  . CAROTID ARTERY STENOSIS 01/13/2010  . ANXIETY 09/29/2009  . ALKALINE PHOSPHATASE, ELEVATED 03/15/2009  . Anxiety and depression 04/28/2011  . Vaginitis 05/22/2011  . Diarrhea 05/22/2011  . Baker's cyst of knee 05/22/2011    Past Surgical History  Procedure Date  . Anterior cruciate ligament repair 08, 09, 10  . Ercp, cbd stone extraction 2010  . Cholecystectomy 2010  . Egd/ dili 2010  . Breast surgery 2001, 2008    lumpectomy (01), Mastectomy modified radical (08), breast reconstruction, CA lesions excised lateral abd wall 2010    Family  History  Problem Relation Age of Onset  . Heart disease Father   . Cancer Father     lung cancer/smoker  . Cirrhosis Sister     Primary Biliary  . Cancer Other     Breast, ovarian, pancreatic  . Stroke Maternal Grandmother   . Alcohol abuse Maternal Grandfather   . Heart disease Paternal Grandfather   . Anxiety disorder Sister   . Osteoporosis Sister   . Cancer Sister     skin  . Osteoporosis Sister   . Other Mother     tic deleauroux    History   Social History  . Marital Status: Married    Spouse Name: N/A    Number of Children: N/A  . Years of Education: N/A   Occupational History  . Not on file.   Social History Main Topics  . Smoking status: Former Smoker    Types: Cigarettes    Quit date: 05/22/2007  . Smokeless tobacco: Never Used  . Alcohol Use: Yes     6 oz daily  . Drug Use: No  . Sexually Active: Yes -- Female partner(s)   Other Topics Concern  . Not on file   Social History Narrative  . No narrative on file    Current Outpatient Prescriptions on File Prior to Visit  Medication Sig Dispense Refill  . amitriptyline (ELAVIL) 150 MG  tablet Take 150 mg by mouth at bedtime.        [name] anastrozole (ARIMIDEX) 1 MG tablet Take 1 mg by mouth daily.        [name] aspirin 81 MG tablet Take 81 mg by mouth daily.        [name] atorvastatin (LIPITOR) 40 MG tablet take 1 tablet by mouth at bedtime  30 tablet  6  . beta carotene 16109 UNIT capsule Take 25,000 Units by mouth daily.        . Calcium Citrate (CITRACAL PO) Take 500 mg by mouth daily.        . cefadroxil (DURICEF) 500 MG capsule Take 500 mg by mouth 2 (two) times daily.        . Cholecalciferol (VITAMIN D3) 1000 UNITS CAPS Take 2 tablets by mouth daily.        . ferrous fumarate (HEMOCYTE - 106 MG FE) 325 (106 FE) MG TABS Take 1 tablet (106 mg of iron total) by mouth daily.  30 each  1  . fluticasone (FLOVENT HFA) 220 MCG/ACT inhaler 2 puffs 2 times a day DO NOT USE SPACER, SWALLOW THE PUFFS SIP A SMALL AMOUNT OF  WATER AFTER THE PUFFS AND THEN NPO X 30 MINS, ALSO RINSE AND SPIT AFTER SWALLOW TO AVOID ORAL THRUSH   1 Inhaler  2  . folic acid (FOLVITE) 1 MG tablet Take 1 tablet (1 mg total) by mouth daily.  30 tablet  11  . HYDROcodone-acetaminophen (VICODIN) 5-500 MG per tablet Take 1 tablet by mouth every 6 (six) hours as needed.        . lidocaine (LIDODERM) 5 % Place 1 patch onto the skin every 12 (twelve) hours. Remove & Discard patch within 12 hours or as directed by MD  30 patch  1  . LORazepam (ATIVAN) 1 MG tablet take 1 tablet by mouth twice a day if needed for anxiety  40 tablet  0  . nebivolol (BYSTOLIC) 5 MG tablet 1/2 tab po daily prn BP>135/90, P>105  21 tablet  0  . NON FORMULARY Zinc oxide 50 mg       . omeprazole (PRILOSEC) 20 MG capsule Take 1 capsule (20 mg total) by mouth 2 (two) times daily.  60 capsule  0  . ondansetron (ZOFRAN) 8 MG tablet Take by mouth 2 (two) times daily.        [name] pyridOXINE (VITAMIN B-6) 50 MG tablet Take 50 mg by mouth 3 (three) times daily.        . sertraline (ZOLOFT) 50 MG tablet Take 1 tablet (50 mg total) by mouth daily.  30 tablet  2  . thiamine 100 MG tablet Take 1 tablet (100 mg total) by mouth daily.  30 tablet  11  . traMADol (ULTRAM) 50 MG tablet Take 1 tablet (50 mg total) by mouth every 8 (eight) hours as needed for pain.  30 tablet  1  . vitamin B-12 (CYANOCOBALAMIN) 1000 MCG tablet Take 1,000 mcg by mouth daily.          Allergies  Allergen Reactions  . Codeine   . Diphenhydramine Hcl   . Morphine     Review of Systems  Review of Systems  Constitutional: Positive for malaise/fatigue. Negative for fever.  HENT: Negative for congestion.   Eyes: Negative for discharge.  Respiratory: Positive for cough. Negative for shortness of breath.   Cardiovascular: Negative for chest pain, palpitations and leg swelling.  Gastrointestinal: Positive for heartburn, nausea  and diarrhea. Negative for abdominal pain, blood in stool and melena.    Genitourinary: Positive for frequency. Negative for dysuria and hematuria.  Musculoskeletal: Negative for falls.  Skin: Negative for rash.  Neurological: Negative for loss of consciousness and headaches.  Endo/Heme/Allergies: Negative for polydipsia.  Psychiatric/Behavioral: Negative for depression and suicidal ideas. The patient is not nervous/anxious and does not have insomnia.     Objective  BP 110/77  Pulse 91  Temp(Src) 98 F (36.7 C) (Oral)  Ht 5\' 6"  (1.676 m)  Wt 129 lb 12.8 oz (58.877 kg)  BMI 20.95 kg/m2  SpO2 97%  Physical Exam  Physical Exam  Constitutional: She is oriented to person, place, and time and well-developed, well-nourished, and in no distress. No distress.  HENT:  Head: Normocephalic and atraumatic.  Eyes: Conjunctivae are normal.  Neck: Neck supple. No thyromegaly present.  Cardiovascular: Normal rate, regular rhythm and normal heart sounds.   No murmur heard. Pulmonary/Chest: Effort normal and breath sounds normal. She has no wheezes.  Abdominal: She exhibits no distension and no mass.  Musculoskeletal: She exhibits no edema.  Lymphadenopathy:    She has no cervical adenopathy.  Neurological: She is alert and oriented to person, place, and time.  Skin: Skin is warm and dry. No rash noted. She is not diaphoretic.  Psychiatric: Memory, affect and judgment normal.     Lab Results  Component Value Date   WBC 6.6 09/12/2011   HGB 11.6* 09/12/2011   HCT 36.4 09/12/2011   MCV 95.8 09/12/2011   PLT 271 09/12/2011   Lab Results  Component Value Date   CREATININE 0.71 09/12/2011   BUN 18 09/12/2011   NA 138 09/12/2011   K 4.6 09/12/2011   CL 102 09/12/2011   CO2 26 09/12/2011   Lab Results  Component Value Date   ALT 21 09/12/2011   AST 21 09/12/2011   ALKPHOS 100 09/12/2011   BILITOT 0.3 09/12/2011   Lab Results  Component Value Date   CHOL 195 03/20/2011   Lab Results  Component Value Date   HDL 76.00 03/20/2011   Lab Results   Component Value Date   LDLCALC 94 03/20/2011   Lab Results  Component Value Date   TRIG 123.0 03/20/2011   Lab Results  Component Value Date   CHOLHDL 3 03/20/2011     Assessment & Plan  Viral illness Symptoms c/w viral illness vs increased alcohol consumption. She had been feeling well and had cut down on alcohol consumption until 4 days ago the day after she ate excessive fatty foods and significantly increased her alcohol consumption. She developed malaise, nausea, diarrhea, fatigue, increased dyspepsia and cough. Labs are reassuring, she is encouraged to increase rest and clear fluid intake, notify us if symptoms worsen or do not improve.  Diarrhea Has recurred slightly, is better today than it was a couple of days ago. Likely due to dietary changes. Encouraged bland diet with hi fiber, low alcohol and low fat intake  Alcohol abuse Encouraged return to decreased alcohol consumption again

## 2011-09-18 NOTE — Assessment & Plan Note (Signed)
Encouraged return to decreased alcohol consumption again

## 2011-09-18 NOTE — Telephone Encounter (Signed)
Please advise refill? 

## 2011-09-18 NOTE — Assessment & Plan Note (Addendum)
Symptoms c/w viral illness vs increased alcohol consumption. She had been feeling well and had cut down on alcohol consumption until 4 days ago the day after she ate excessive fatty foods and significantly increased her alcohol consumption. She developed malaise, nausea, diarrhea, fatigue, increased dyspepsia and cough. Labs are reassuring, she is encouraged to increase rest and clear fluid intake, notify us if symptoms worsen or do not improve.

## 2011-09-18 NOTE — Assessment & Plan Note (Signed)
Has recurred slightly, is better today than it was a couple of days ago. Likely due to dietary changes. Encouraged bland diet with hi fiber, low alcohol and low fat intake

## 2011-09-19 ENCOUNTER — Ambulatory Visit: Payer: Medicare HMO | Admitting: Family Medicine

## 2011-09-19 ENCOUNTER — Telehealth: Payer: Self-pay

## 2011-09-19 NOTE — Telephone Encounter (Signed)
Anne [name] CALLED REQUESTING THE RESULTS OF HER BONE DENSITY SCAN DONE  LAST WEEK.  IF SHE IS NOT AVAILABLE, A MESSAGE CAN BE LEFT IN HER VOICE MAIL.

## 2011-09-20 ENCOUNTER — Encounter: Payer: Self-pay | Admitting: Family Medicine

## 2011-09-20 ENCOUNTER — Ambulatory Visit (INDEPENDENT_AMBULATORY_CARE_PROVIDER_SITE_OTHER): Payer: Medicare HMO | Admitting: Family Medicine

## 2011-09-20 VITALS — BP 104/71 | HR 97 | Temp 98.0°F | Ht 66.0 in | Wt 129.8 lb

## 2011-09-20 DIAGNOSIS — F341 Dysthymic disorder: Secondary | ICD-10-CM

## 2011-09-20 DIAGNOSIS — F419 Anxiety disorder, unspecified: Secondary | ICD-10-CM

## 2011-09-20 DIAGNOSIS — F1010 Alcohol abuse, uncomplicated: Secondary | ICD-10-CM

## 2011-09-20 DIAGNOSIS — F329 Major depressive disorder, single episode, unspecified: Secondary | ICD-10-CM

## 2011-09-20 DIAGNOSIS — G4700 Insomnia, unspecified: Secondary | ICD-10-CM

## 2011-09-20 DIAGNOSIS — B349 Viral infection, unspecified: Secondary | ICD-10-CM

## 2011-09-20 DIAGNOSIS — B9789 Other viral agents as the cause of diseases classified elsewhere: Secondary | ICD-10-CM

## 2011-09-20 DIAGNOSIS — F32A Depression, unspecified: Secondary | ICD-10-CM

## 2011-09-20 DIAGNOSIS — F3289 Other specified depressive episodes: Secondary | ICD-10-CM

## 2011-09-20 MED ORDER — AMITRIPTYLINE HCL 150 MG PO TABS: 150.0000 mg | ORAL_TABLET | Freq: Every day | ORAL | Status: DC

## 2011-09-20 MED ORDER — SERTRALINE HCL 100 MG PO TABS: 100.0000 mg | ORAL_TABLET | Freq: Every day | ORAL | Status: DC

## 2011-09-20 NOTE — Patient Instructions (Signed)
Depression You have signs of depression. This is a common problem. It can occur at any age. It is often hard to recognize. People can suffer from depression and still have moments of enjoyment. Depression interferes with your basic ability to function in life. It upsets your relationships, sleep, eating, and work habits. CAUSES  Depression is believed to be caused by an imbalance in brain chemicals. It may be triggered by an unpleasant event. Relationship crises, a death in the family, financial worries, retirement, or other stressors are normal causes of depression. Depression may also start for no known reason. Other factors that may play a part include medical illnesses, some medicines, genetics, and alcohol or drug abuse. SYMPTOMS   Feeling unhappy or worthless.   Long-lasting (chronic) tiredness or worn-out feeling.   Self-destructive thoughts and actions.   Not being able to sleep or sleeping too much.   Eating more than usual or not eating at all.   Headaches or feeling anxious.   Trouble concentrating or making decisions.   Unexplained physical problems and substance abuse.  TREATMENT  Depression usually gets better with treatment. This can include:  Antidepressant medicines. It can take weeks before the proper dose is achieved and benefits are reached.   Talking with a therapist, clergyperson, counselor, or friend. These people can help you gain insight into your problem and regain control of your life.   Eating a good diet.   Getting regular physical exercise, such as walking for 30 minutes every day.   Not abusing alcohol or drugs.  Treating depression often takes 6 months or longer. This length of treatment is needed to keep symptoms from returning. Call your caregiver and arrange for follow-up care as suggested. SEEK IMMEDIATE MEDICAL CARE IF:   You start to have thoughts of hurting yourself or others.   Call your local emergency services (911 in [place]   Go to  your local medical emergency department.   Call the National Suicide Prevention Lifeline: 1-800-273-TALK (604)456-6019).  Document Released: 10/29/2005 Document Revised: 07/11/2011 Document Reviewed: 03/31/2010 [facility] Patient Information 2012 [place], [place].

## 2011-09-21 NOTE — Assessment & Plan Note (Signed)
She has continued to drink but has cut back once again

## 2011-09-21 NOTE — Progress Notes (Signed)
Anne [name] [id number] [birth year] 09/21/2011      Progress Note-Follow Up  Subjective  Chief Complaint  Chief Complaint  Patient presents with  . Follow-up    on medication    HPI  Patient is a 70 yo Caucasian female in today for follow up. She is feeling much better. At her last visit she had a viral illness, her malaise and GI symptoms have resolved. Physically she feels much better, no recent fevers, congestion, cp, palp, sob, gi or gu complaints at this time. She is noting worsening depression and is requesting an increase in her Sertraline, she denies suicidal ideation. She does continue to drink variable amounts of alcohol intake  Past Medical History  Diagnosis Date  . Anemia   . Anxiety   . COPD (chronic obstructive pulmonary disease)   . Hyperlipidemia   . Pneumonia 2-10    pleurisy  . Arthritis   . Depression   . GERD (gastroesophageal reflux disease)     improved s/p cholecystectomy and esophagus dilatation  . History of chicken pox   . History of shingles     2 episodes  . History of measles   . Osteopenia 03/14/2011  . 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 Pure hypercholesterolemia 10/17/2010  . Constipation 03/15/2011  . 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 GERD 09/29/2009  . ESOPHAGEAL STRICTURE 03/29/2009  . CONSTIPATION 09/29/2009  . CAROTID ARTERY STENOSIS 01/13/2010  . ANXIETY 09/29/2009  . ALKALINE PHOSPHATASE, ELEVATED 03/15/2009  . Anxiety and depression 04/28/2011  . Vaginitis 05/22/2011  . Diarrhea 05/22/2011  . Baker's cyst of knee 05/22/2011    Past Surgical History  Procedure Date  . Anterior cruciate ligament repair 08, 09, 10  . Ercp, cbd stone extraction 2010  . Cholecystectomy 2010  . Egd/ dili 2010  . Breast surgery 2001, 2008    lumpectomy (01), Mastectomy modified radical (08), breast reconstruction, CA lesions excised lateral abd wall 2010    Family History  Problem Relation Age of Onset  . Heart disease  Father   . Cancer Father     lung cancer/smoker  . Cirrhosis Sister     Primary Biliary  . Cancer Other     Breast, ovarian, pancreatic  . Stroke Maternal Grandmother   . Alcohol abuse Maternal Grandfather   . Heart disease Paternal Grandfather   . Anxiety disorder Sister   . Osteoporosis Sister   . Cancer Sister     skin  . Osteoporosis Sister   . Other Mother     tic deleauroux    History   Social History  . Marital Status: Married    Spouse Name: N/A    Number of Children: N/A  . Years of Education: N/A   Occupational History  . Not on file.   Social History Main Topics  . Smoking status: Former Smoker    Types: Cigarettes    Quit date: 05/22/2007  . Smokeless tobacco: Never Used  . Alcohol Use: Yes     6 oz daily  . Drug Use: No  . Sexually Active: Yes -- Female partner(s)   Other Topics Concern  . Not on file   Social History Narrative  . No narrative on file    Current Outpatient Prescriptions on File Prior to Visit  Medication Sig Dispense Refill  . anastrozole (ARIMIDEX) 1 MG tablet Take 1 mg by mouth daily.        [name]  aspirin 81 MG tablet Take 81 mg by mouth daily.        [name] atorvastatin (LIPITOR) 40 MG tablet take 1 tablet by mouth at bedtime  30 tablet  6  . beta carotene 16109 UNIT capsule Take 25,000 Units by mouth daily.        . Calcium Citrate (CITRACAL PO) Take 500 mg by mouth daily.        . cefadroxil (DURICEF) 500 MG capsule Take 500 mg by mouth 2 (two) times daily.        . Cholecalciferol (VITAMIN D3) 1000 UNITS CAPS Take 2 tablets by mouth daily.        . ferrous fumarate (HEMOCYTE - 106 MG FE) 325 (106 FE) MG TABS Take 1 tablet (106 mg of iron total) by mouth daily.  30 each  1  . fluticasone (FLOVENT HFA) 220 MCG/ACT inhaler 2 puffs 2 times a day DO NOT USE SPACER, SWALLOW THE PUFFS SIP A SMALL AMOUNT OF WATER AFTER THE PUFFS AND THEN NPO X 30 MINS, ALSO RINSE AND SPIT AFTER SWALLOW TO AVOID ORAL THRUSH   1 Inhaler  2  . folic acid  (FOLVITE) 1 MG tablet Take 1 tablet (1 mg total) by mouth daily.  30 tablet  11  . HYDROcodone-acetaminophen (VICODIN) 5-500 MG per tablet Take 1 tablet by mouth every 6 (six) hours as needed.        . lidocaine (LIDODERM) 5 % Place 1 patch onto the skin every 12 (twelve) hours. Remove & Discard patch within 12 hours or as directed by MD  30 patch  1  . LORazepam (ATIVAN) 1 MG tablet take 1 tablet by mouth twice a day if needed for anxiety  40 tablet  0  . nebivolol (BYSTOLIC) 5 MG tablet 1/2 tab po daily prn BP>135/90, P>105  21 tablet  0  . NON FORMULARY Zinc oxide 50 mg       . omeprazole (PRILOSEC) 20 MG capsule Take 1 capsule (20 mg total) by mouth 2 (two) times daily.  60 capsule  0  . ondansetron (ZOFRAN) 8 MG tablet Take by mouth 2 (two) times daily.        [name] pyridOXINE (VITAMIN B-6) 50 MG tablet Take 50 mg by mouth 3 (three) times daily.        [name] thiamine 100 MG tablet Take 1 tablet (100 mg total) by mouth daily.  30 tablet  11  . traMADol (ULTRAM) 50 MG tablet Take 1 tablet (50 mg total) by mouth every 8 (eight) hours as needed for pain.  30 tablet  1  . vitamin B-12 (CYANOCOBALAMIN) 1000 MCG tablet Take 1,000 mcg by mouth daily.          Allergies  Allergen Reactions  . Codeine   . Diphenhydramine Hcl   . Morphine     Review of Systems  Review of Systems  Constitutional: Negative for fever and malaise/fatigue.  HENT: Negative for congestion.   Eyes: Negative for discharge.  Respiratory: Negative for shortness of breath.   Cardiovascular: Negative for chest pain, palpitations and leg swelling.  Gastrointestinal: Positive for diarrhea. Negative for nausea and abdominal pain.       Now resolved  Genitourinary: Negative for dysuria.  Musculoskeletal: Negative for falls.  Skin: Negative for rash.  Neurological: Negative for loss of consciousness and headaches.  Endo/Heme/Allergies: Negative for polydipsia.  Psychiatric/Behavioral: Positive for depression and substance abuse.  Negative for suicidal ideas. The patient is nervous/anxious. The  patient does not have insomnia.     Objective  BP 104/71  Pulse 97  Temp(Src) 98 F (36.7 C) (Oral)  Ht 5\' 6"  (1.676 m)  Wt 129 lb 12.8 oz (58.877 kg)  BMI 20.95 kg/m2  SpO2 97%  Physical Exam  Physical Exam  Constitutional: She is oriented to person, place, and time and well-developed, well-nourished, and in no distress. No distress.  HENT:  Head: Normocephalic and atraumatic.  Eyes: Conjunctivae are normal.  Neck: Neck supple. No thyromegaly present.  Cardiovascular: Normal rate, regular rhythm and normal heart sounds.   No murmur heard. Pulmonary/Chest: Effort normal and breath sounds normal. She has no wheezes.  Abdominal: She exhibits no distension and no mass.  Musculoskeletal: She exhibits no edema.  Lymphadenopathy:    She has no cervical adenopathy.  Neurological: She is alert and oriented to person, place, and time.  Skin: Skin is warm and dry. No rash noted. She is not diaphoretic.  Psychiatric: Memory, affect and judgment normal.     Lab Results  Component Value Date   WBC 6.6 09/12/2011   HGB 11.6* 09/12/2011   HCT 36.4 09/12/2011   MCV 95.8 09/12/2011   PLT 271 09/12/2011   Lab Results  Component Value Date   CREATININE 0.71 09/12/2011   BUN 18 09/12/2011   NA 138 09/12/2011   K 4.6 09/12/2011   CL 102 09/12/2011   CO2 26 09/12/2011   Lab Results  Component Value Date   ALT 21 09/12/2011   AST 21 09/12/2011   ALKPHOS 100 09/12/2011   BILITOT 0.3 09/12/2011   Lab Results  Component Value Date   CHOL 195 03/20/2011   Lab Results  Component Value Date   HDL 76.00 03/20/2011   Lab Results  Component Value Date   LDLCALC 94 03/20/2011   Lab Results  Component Value Date   TRIG 123.0 03/20/2011   Lab Results  Component Value Date   CHOLHDL 3 03/20/2011     Assessment & Plan  Viral illness Feels much better today, had one more day of malaise and diarrhea but that was  nearly a week ago.  Anxiety and depression Patient reports struggling with increased irritability and lability recently. When she first started the Sertraline she felt it was helping more. She has not had any trouble with it and she believes an increase would be helpful, so we will increase it to 100 mg and we will reevaluate at next visit in 4 weeks or as needed  Alcohol abuse She has continued to drink but has cut back once again

## 2011-09-21 NOTE — Assessment & Plan Note (Addendum)
Feels much better today, had one more day of malaise and diarrhea but that was nearly a week ago.

## 2011-09-21 NOTE — Assessment & Plan Note (Signed)
Patient reports struggling with increased irritability and lability recently. When she first started the Sertraline she felt it was helping more. She has not had any trouble with it and she believes an increase would be helpful, so we will increase it to 100 mg and we will reevaluate at next visit in 4 weeks or as needed

## 2011-09-24 ENCOUNTER — Telehealth: Payer: Self-pay

## 2011-09-24 NOTE — Telephone Encounter (Signed)
LEFT A MESSAGE FOR [facility] [place] STATING THAT HER BONE DENSITY  IS A LITTLE LOW, BUT JUST OSTEOPENIC RANGE PER DR. Darrold Span.  THIS IS BETTER THAN THE VERY LOW OSTEOPOROTIC.  GETTING BACK TO A REGULAR WALKING PROGRAM WILL  BE GOOD FOR THE BONE DENSITY.

## 2011-10-06 ENCOUNTER — Other Ambulatory Visit: Payer: Self-pay | Admitting: Family Medicine

## 2011-10-08 NOTE — Telephone Encounter (Signed)
Please advise 

## 2011-10-08 NOTE — Telephone Encounter (Signed)
Given new rx on 11/24? If not can refill

## 2011-10-13 DIAGNOSIS — S52133A Displaced fracture of neck of unspecified radius, initial encounter for closed fracture: Secondary | ICD-10-CM

## 2011-10-13 HISTORY — DX: Displaced fracture of neck of unspecified radius, initial encounter for closed fracture: S52.133A

## 2011-10-15 ENCOUNTER — Telehealth: Payer: Self-pay | Admitting: Internal Medicine

## 2011-10-15 NOTE — Progress Notes (Signed)
Summary: fell on knee/TM(rm5)   Vital Signs:  Patient Profile:   70 Years Old Female CC:      fall- knee injury O2 treatment:    Room Air (left arm) Cuff size:   regular  Vitals Entered By: Linton Flemings RN (May 13, 2011 2:49 PM)                  Updated Prior Medication List: Prudy Feeler 0.5 MG TABS (ALPRAZOLAM) at bedtime AMITRIPTYLINE HCL 150 MG TABS (AMITRIPTYLINE HCL) Take 1 tab by mouth at bedtime OMEPRAZOLE 20 MG  CPDR (OMEPRAZOLE) take one by mouth two times a day VITAMIN B-6 50 MG TABS (PYRIDOXINE HCL) three times a day VITAMIN B-12 1000 MCG TABS (CYANOCOBALAMIN) once daily VITAMIN D3 1000 UNIT TABS (CHOLECALCIFEROL) take 2 tablets daily ARIMIDEX 1 MG TABS (ANASTROZOLE) one tablet by mouth once daily LIPITOR 40 MG TABS (ATORVASTATIN CALCIUM) 1 tab prior to bedtime  Current Allergies (reviewed today): ! BENADRYL ! CODEINE ! MORPHINEHistory of Present Illness History from: patient Chief Complaint: fall- knee injury History of Present Illness: She was leaving [employer] yesterday and slipped on some food that was on the floor. It happened quickly and she doesn't recall how she fell but she landed with her R leg underneath her.  Pain and swelling, especially in the back side of her R knee.  She has had a Baker's cyst in that same knee previously but no treatment.  No F/C/N/V.  Not using any meds.  REVIEW OF SYSTEMS Constitutional Symptoms      Denies fever, chills, night sweats, weight loss, weight gain, and fatigue.  Eyes       Complains of eye surgery.      Denies change in vision, eye pain, eye discharge, glasses, and contact lenses. Ear/Nose/Throat/Mouth       Denies hearing loss/aids, change in hearing, ear pain, ear discharge, dizziness, frequent runny nose, frequent nose bleeds, sinus problems, sore throat, hoarseness, and tooth pain or bleeding.  Respiratory       Complains of shortness of breath.      Denies dry cough, productive cough, wheezing, asthma,  bronchitis, and emphysema/COPD.  Cardiovascular       Denies murmurs, chest pain, and tires easily with exhertion.    Gastrointestinal       Denies stomach pain, nausea/vomiting, diarrhea, constipation, blood in bowel movements, and indigestion. Genitourniary       Denies painful urination, kidney stones, and loss of urinary control. Neurological       Denies paralysis, seizures, and fainting/blackouts. Musculoskeletal       Denies muscle pain, joint pain, joint stiffness, decreased range of motion, redness, swelling, muscle weakness, and gout.  Skin       Denies bruising, unusual mles/lumps or sores, and hair/skin or nail changes.  Psych       Denies mood changes, temper/anger issues, anxiety/stress, speech problems, depression, and sleep problems.  Past History:  Past Medical History: Reviewed history from 05/20/2009 and no changes required. Breast Cancer 2001, 2008 Anemia Anxiety Disorder COPD Hyperlipidemia Pneumonia/Pleurisy 12/2008 Arthritis Breast Cancer XRT/chemo 2001-2002 Depression  Past Surgical History: Reviewed history from 05/20/2009 and no changes required. Breast-Lumpectomy 2001 Breast-Mastectomy-modified radical 2008 Reconstruction surgery,2008,2009, 2010 ERCP, CBD stone extraction 2010 Cholecystectomy 2010 EGD/dili 2010  Family History: Reviewed history from 05/20/2009 and no changes required. Family History of Breast Cancer: Family History of Ovarian Cancer: Family History of Pancreatic Cancer: Family History of Heart Disease: Father Primary Biliary Cirrhosis-sister died age  58  Social History: Reviewed history from 03/15/2009 and no changes required. Occupation: Retired Patient is a former smoker.  Alcohol Use - yes occ Daily Caffeine Use -2 Illicit Drug Use - no Physical Exam General appearance: well developed, well nourished, no acute distress MSE: oriented to time, place, and person R knee: FROM, 1+ effusion and swelling posterior knee  c/w Baker's cyst, no ecchymoses, Lachmans normal, Anterior & posterior drawer normal, McMurrays painful, Varus & valgus stress normal.  Patella freely mobile, Clarks compression test slightly painful.  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  Since she does have a baker's cyst and a effusion, I suspect a meniscal injury, although with FROM could be reactivation of a chronic injury.  Will give her a knee sleeve and refer her to sports med for follow up as she may need PT or cortisone if not improving.   Arthrotec three times a day as needed (because she has some esophageal/stomach issues).    The patient and/or caregiver has been counseled thoroughly with regard to medications prescribed including dosage, schedule, interactions, rationale for use, and possible side effects and they verbalize understanding.  Diagnoses and expected course of recovery discussed and will return if not improved as expected or if the condition worsens. Patient and/or caregiver verbalized understanding.  Prescriptions: ARTHROTEC 50-200 MG-MCG TABS (DICLOFENAC-MISOPROSTOL) 1 by mouth three times a day as needed for knee pain  #21 x 0   Entered and Authorized by:   Hoyt Koch MD   Signed by:   Hoyt Koch MD on 05/13/2011   Method used:   Print then Give to Patient   RxID:   [id number]   Orders Added: 1)  T-DG Knee Complete 4 Views*R* [62952] 2)  Knee Sleeve [L1825] 3)  New Patient Level III [84132]

## 2011-10-16 ENCOUNTER — Ambulatory Visit (INDEPENDENT_AMBULATORY_CARE_PROVIDER_SITE_OTHER): Payer: Medicare HMO | Admitting: Family Medicine

## 2011-10-16 ENCOUNTER — Encounter: Payer: Self-pay | Admitting: Family Medicine

## 2011-10-16 DIAGNOSIS — T1490XA Injury, unspecified, initial encounter: Secondary | ICD-10-CM

## 2011-10-16 DIAGNOSIS — F32A Depression, unspecified: Secondary | ICD-10-CM

## 2011-10-16 DIAGNOSIS — F329 Major depressive disorder, single episode, unspecified: Secondary | ICD-10-CM

## 2011-10-16 DIAGNOSIS — F341 Dysthymic disorder: Secondary | ICD-10-CM

## 2011-10-16 DIAGNOSIS — F1010 Alcohol abuse, uncomplicated: Secondary | ICD-10-CM

## 2011-10-16 NOTE — Patient Instructions (Signed)
Bursitis Bursitis is a swelling and soreness (inflammation) of a fluid-filled sac (bursa) that overlies and protects a joint. It can be caused by injury, overuse of the joint, arthritis or infection. The joints most likely to be affected are the elbows, shoulders, hips and knees. HOME CARE INSTRUCTIONS   Apply ice to the affected area for 15 to 20 minutes each hour while awake for 2 days. Put the ice in a plastic bag and place a towel between the bag of ice and your skin.   Rest the injured joint as much as possible, but continue to put the joint through a full range of motion, 4 times per day. (The shoulder joint especially becomes rapidly "frozen" if not used.) When the pain lessens, begin normal slow movements and usual activities.   Only take over-the-counter or prescription medicines for pain, discomfort or fever as directed by your caregiver.   Your caregiver may recommend draining the bursa and injecting medicine into the bursa. This may help the healing process.   Follow all instructions for follow-up with your caregiver. This includes any orthopedic referrals, physical therapy and rehabilitation. Any delay in obtaining necessary care could result in a delay or failure of the bursitis to heal and chronic pain.  SEEK IMMEDIATE MEDICAL CARE IF:   Your pain increases even during treatment.   You develop an oral temperature above 102 F (38.9 C) and have heat and inflammation over the involved bursa.  MAKE SURE YOU:   Understand these instructions.   Will watch your condition.   Will get help right away if you are not doing well or get worse.  Document Released: 10/26/2000 Document Revised: 07/11/2011 Document Reviewed: 09/30/2009 [employer] Patient Information 2012 [employer]. 

## 2011-10-17 ENCOUNTER — Ambulatory Visit: Payer: Medicare HMO | Admitting: Family Medicine

## 2011-10-18 ENCOUNTER — Encounter: Payer: Self-pay | Admitting: Family Medicine

## 2011-10-18 DIAGNOSIS — T1490XA Injury, unspecified, initial encounter: Secondary | ICD-10-CM | POA: Insufficient documentation
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
## 2011-10-18 NOTE — Assessment & Plan Note (Signed)
We have recently increased her Sertraline and it is too soon to make further changes, she has not had any difficult SE. She reports she does not take the Lorazepam when she has consumed alcohol as instructed
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
## 2011-10-19 ENCOUNTER — Telehealth: Payer: Self-pay | Admitting: *Deleted

## 2011-10-19 NOTE — Telephone Encounter (Signed)
Message copied from daughter's chart.  Daughter did not return call after first day and follow up was ended.   Iris Pert   10/16/2011 10:41 AM Telephone  MRN:   Description:  Provider: Department: [facility]     Reason for Call     Advice Only    Patient would like to talk to you about her parents Sam and [place]. She can schedule an appointment or talk over the phone.       Reason For Call History Recorded         Call Documentation     Francee Piccolo, [facility]  10/19/2011  1:17 PM  Signed No return call from Ms. Truelove.  Follow up ended.    Francee Piccolo, [facility]  10/16/2011  4:44 PM  Signed I have attempted to contact this patient by phone with the following results: left message to return my call on answering machine (home).       Routing History        From To Priority    10/16/2011 10:51 AM Nikki T Denton Meek, CMA Routine     Created by     Barnie Mort on 10/16/2011 10:41 AM     Contacts        Type Contact Phone    10/16/2011 10:41 AM Phone (Incoming) Geannie Risen, Idalia Needle (Self) [phone] (H)

## 2011-10-22 ENCOUNTER — Telehealth: Payer: Self-pay

## 2011-10-22 NOTE — Telephone Encounter (Signed)
Pt was also offered and appt. Per MD pt can take 1 every 6 hrs prn for pain

## 2011-10-22 NOTE — Telephone Encounter (Signed)
Pt called stating her arm is still hurting really bad? Pt states a lot of the bruising has gone down but its still swollen? Pt states she has pain medication left from another fall and would like to know if its okay to take? Tramadol is what patient has left. Per md patient can take pain medication as directed on bottle but not to take it while directly with Lorazepam or while drinking. Pt informed and states the Tramadol bottle reads to take every 8 hrs? Pt would like to know if she can take it more often?  Please advise?

## 2011-10-22 NOTE — Telephone Encounter (Signed)
Pt informed and states if its not any better tomorrow morning she will schedule an appt

## 2011-10-24 ENCOUNTER — Other Ambulatory Visit: Payer: Self-pay | Admitting: Family Medicine

## 2011-10-24 NOTE — Telephone Encounter (Signed)
Please advise refill? Last refill is 10-08-11

## 2011-10-25 ENCOUNTER — Telehealth: Payer: Self-pay | Admitting: *Deleted

## 2011-10-25 NOTE — Telephone Encounter (Signed)
It is possible she could benefit from a steroid injection, I spoke with Dr Milinda Cave and he would have to see it to decide but he might be able to inject it. Why doesn't she come in and see [place] tomorrow to evaluate

## 2011-10-25 NOTE — Telephone Encounter (Signed)
Pt left voice message regarding her bursitis in her left arm.. Want to know if steroid injection would be better than pain med that is not workign.  Please advise.

## 2011-10-25 NOTE — Telephone Encounter (Signed)
Informed patient of Dr. Mariel Aloe response and scheduled an appointment for tomorrow morning.

## 2011-10-26 ENCOUNTER — Ambulatory Visit (INDEPENDENT_AMBULATORY_CARE_PROVIDER_SITE_OTHER): Payer: Medicare HMO | Admitting: Family Medicine

## 2011-10-26 ENCOUNTER — Encounter: Payer: Self-pay | Admitting: Family Medicine

## 2011-10-26 VITALS — BP 121/83 | HR 93 | Temp 97.6°F | Ht 66.0 in | Wt 130.0 lb

## 2011-10-26 DIAGNOSIS — M778 Other enthesopathies, not elsewhere classified: Secondary | ICD-10-CM

## 2011-10-26 DIAGNOSIS — F341 Dysthymic disorder: Secondary | ICD-10-CM

## 2011-10-26 DIAGNOSIS — R000 Tachycardia, unspecified: Secondary | ICD-10-CM

## 2011-10-26 DIAGNOSIS — T1490XA Injury, unspecified, initial encounter: Secondary | ICD-10-CM

## 2011-10-26 DIAGNOSIS — F329 Major depressive disorder, single episode, unspecified: Secondary | ICD-10-CM

## 2011-10-26 MED ORDER — METHYLPREDNISOLONE (PAK) 4 MG PO TABS: 4.0000 mg | ORAL_TABLET | Freq: Every day | ORAL | Status: DC

## 2011-10-26 MED ORDER — METHYLPREDNISOLONE ACETATE PF 40 MG/ML IJ SUSP
20.0000 mg | Freq: Once | INTRAMUSCULAR | Status: AC
  Administered 2011-10-26: 20 mg via INTRAMUSCULAR

## 2011-10-26 MED ORDER — DICLOFENAC SODIUM 1 % TD GEL: 1.0000 "application " | Freq: Two times a day (BID) | TRANSDERMAL | Status: DC | PRN

## 2011-10-26 NOTE — Progress Notes (Signed)
Patient ID: Anne [name], female   DOB: Jan 26, 1941, 70 y.o.   MRN: [id number] [name] [id number] 1941-06-06 10/26/2011      Progress Note-Follow Up  Subjective  Chief Complaint  Chief Complaint  Patient presents with  . Shoulder Pain    left side    HPI  Patient is a 70 year old Caucasian female who is in today with persistent pain in her left arm. She suffered a trauma in her left arm in the last couple of weeks and had a lot of swelling in her left elbow initially but the swelling is resolved. Is worse with significant stranding are more internal or external rotation. The pain often starts in the elbow and radiates down to the wrist or shoulder. It is worse in the base of the radius. Worse with weightbearing and significant movement. No numbness or tingling in the hand. No other recent complaints. He is accompanied by her husband continues to show for depression but does feel the sertraline is helping some. No further falls or  Past Medical History  Diagnosis Date  . Anemia   . Anxiety   . COPD (chronic obstructive pulmonary disease)   . Hyperlipidemia   . Pneumonia 2-10    pleurisy  . Arthritis   . Depression   . GERD (gastroesophageal reflux disease)     improved s/p cholecystectomy and esophagus dilatation  . History of chicken pox   . History of shingles     2 episodes  . History of measles   . Osteopenia 03/14/2011  . 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 Pure hypercholesterolemia 10/17/2010  . Constipation 03/15/2011  . 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 GERD 09/29/2009  . ESOPHAGEAL STRICTURE 03/29/2009  . CONSTIPATION 09/29/2009  . CAROTID ARTERY STENOSIS 01/13/2010  . ANXIETY 09/29/2009  . ALKALINE PHOSPHATASE, ELEVATED 03/15/2009  . Anxiety and depression 04/28/2011  . Vaginitis 05/22/2011  . Diarrhea 05/22/2011  . Baker's cyst of knee 05/22/2011  . Trauma 10/18/2011    Past Surgical History  Procedure Date  . Anterior cruciate  ligament repair 08, 09, 10  . Ercp, cbd stone extraction 2010  . Cholecystectomy 2010  . Egd/ dili 2010  . Breast surgery 2001, 2008    lumpectomy (01), Mastectomy modified radical (08), breast reconstruction, CA lesions excised lateral abd wall 2010    Family History  Problem Relation Age of Onset  . Heart disease Father   . Cancer Father     lung cancer/smoker  . Cirrhosis Sister     Primary Biliary  . Cancer Other     Breast, ovarian, pancreatic  . Stroke Maternal Grandmother   . Alcohol abuse Maternal Grandfather   . Heart disease Paternal Grandfather   . Anxiety disorder Sister   . Osteoporosis Sister   . Cancer Sister     skin  . Osteoporosis Sister   . Other Mother     tic deleauroux    History   Social History  . Marital Status: Married    Spouse Name: N/A    Number of Children: N/A  . Years of Education: N/A   Occupational History  . Not on file.   Social History Main Topics  . Smoking status: Former Smoker    Types: Cigarettes    Quit date: 05/22/2007  . Smokeless tobacco: Never Used  . Alcohol Use: Yes     6 oz daily  . Drug Use: No  .  Sexually Active: Yes -- Female partner(s)   Other Topics Concern  . Not on file   Social History Narrative  . No narrative on file    Current Outpatient Prescriptions on File Prior to Visit  Medication Sig Dispense Refill  . amitriptyline (ELAVIL) 150 MG tablet Take 1 tablet (150 mg total) by mouth at bedtime.  90 tablet  3  . anastrozole (ARIMIDEX) 1 MG tablet Take 1 mg by mouth daily.        [name] aspirin 81 MG tablet Take 81 mg by mouth daily.        [name] atorvastatin (LIPITOR) 40 MG tablet take 1 tablet by mouth at bedtime  30 tablet  6  . beta carotene 19147 UNIT capsule Take 25,000 Units by mouth daily.        . Calcium Citrate (CITRACAL PO) Take 500 mg by mouth daily.        . Cholecalciferol (VITAMIN D3) 1000 UNITS CAPS Take 2 tablets by mouth daily.        . ferrous fumarate (HEMOCYTE - 106 MG FE) 325 (106  FE) MG TABS Take 1 tablet (106 mg of iron total) by mouth daily.  30 each  1  . folic acid (FOLVITE) 1 MG tablet Take 1 tablet (1 mg total) by mouth daily.  30 tablet  11  . LORazepam (ATIVAN) 1 MG tablet take 1 tablet by mouth twice a day if needed for anxiety  40 tablet  0  . omeprazole (PRILOSEC) 20 MG capsule Take 1 capsule (20 mg total) by mouth 2 (two) times daily.  60 capsule  0  . pyridOXINE (VITAMIN B-6) 50 MG tablet Take 50 mg by mouth 3 (three) times daily.        . sertraline (ZOLOFT) 100 MG tablet Take 1 tablet (100 mg total) by mouth daily.  30 tablet  2  . thiamine 100 MG tablet Take 1 tablet (100 mg total) by mouth daily.  30 tablet  11  . vitamin B-12 (CYANOCOBALAMIN) 1000 MCG tablet Take 1,000 mcg by mouth daily.         No current facility-administered medications on file prior to visit.    Allergies  Allergen Reactions  . Codeine   . Diphenhydramine Hcl   . Morphine     Review of Systems  Review of Systems  Constitutional: Negative for fever and malaise/fatigue.  HENT: Negative for congestion.   Eyes: Negative for discharge.  Respiratory: Positive for cough. Negative for shortness of breath.        Slight  Cardiovascular: Negative for chest pain, palpitations and leg swelling.  Gastrointestinal: Negative for nausea, abdominal pain and diarrhea.  Genitourinary: Negative for dysuria.  Musculoskeletal: Positive for myalgias and joint pain. Negative for falls.       Left arm pain from shoulder to wrist with internal and external rotation. Selling resolved, no pain in neck or neurologic c/o.  Skin: Negative for rash.  Neurological: Negative for loss of consciousness and headaches.  Endo/Heme/Allergies: Negative for polydipsia.  Psychiatric/Behavioral: Negative for depression and suicidal ideas. The patient is not nervous/anxious and does not have insomnia.     Objective  BP 121/83  Pulse 93  Temp(Src) 97.6 F (36.4 C) (Temporal)  Ht 5\' 6"  (1.676 m)  Wt 130  lb (58.968 kg)  BMI 20.98 kg/m2  Physical Exam  Physical Exam  Constitutional: She is oriented to person, place, and time and well-developed, well-nourished, and in no distress. No distress.  HENT:  Head: Normocephalic and atraumatic.  Eyes: Conjunctivae are normal.  Neck: Neck supple. No thyromegaly present.  Cardiovascular: Normal rate, regular rhythm and normal heart sounds.   No murmur heard. Pulmonary/Chest: Effort normal and breath sounds normal. She has no wheezes.  Abdominal: She exhibits no distension and no mass.  Musculoskeletal: Normal range of motion. She exhibits tenderness. She exhibits no edema.       Tender over radial prominence of left wrist, no edema or redness. Pain with rotation at hip and wrist  Lymphadenopathy:    She has no cervical adenopathy.  Neurological: She is alert and oriented to person, place, and time.  Skin: Skin is warm and dry. No rash noted. She is not diaphoretic.  Psychiatric: Memory, affect and judgment normal.     Lab Results  Component Value Date   WBC 6.6 09/12/2011   HGB 11.6* 09/12/2011   HCT 36.4 09/12/2011   MCV 95.8 09/12/2011   PLT 271 09/12/2011   Lab Results  Component Value Date   CREATININE 0.71 09/12/2011   BUN 18 09/12/2011   NA 138 09/12/2011   K 4.6 09/12/2011   CL 102 09/12/2011   CO2 26 09/12/2011   Lab Results  Component Value Date   ALT 21 09/12/2011   AST 21 09/12/2011   ALKPHOS 100 09/12/2011   BILITOT 0.3 09/12/2011   Lab Results  Component Value Date   CHOL 195 03/20/2011   Lab Results  Component Value Date   HDL 76.00 03/20/2011   Lab Results  Component Value Date   LDLCALC 94 03/20/2011   Lab Results  Component Value Date   TRIG 123.0 03/20/2011   Lab Results  Component Value Date   CHOLHDL 3 03/20/2011     Assessment & Plan  Trauma Left elbow swelling is down but she has increased pain over her wrist especially with internal and external rotation. Pain radiates up and down the arm  from the wrist and to the shoulder at times. No point tenderness or weakness. Given a shot of Depo Medrol 20 mg once and then given a Medrol dose pack, also given a sample of Voltaren gel to use bid, given a splint for wrist as well asked to splint it as much as possible and to ice tid. Notify us if symptoms worsen or do not improve.  Tachycardia Adequately controlled, no changes  Anxiety and depression Accompanied by her husband today, continues to struggle with depression but feels Sertraline is helping some

## 2011-10-26 NOTE — Patient Instructions (Signed)
Tendinitis Tendinitis is swelling and inflammation of the tendons. Tendons are band-like tissues that connect muscle to bone. Tendinitis commonly occurs in the:   Shoulders (rotator cuff).   Heels (Achilles tendon).   Elbows (triceps tendon).  CAUSES Tendinitis is usually caused by overusing the tendon, muscles, and joints involved. When the tissue surrounding a tendon (synovium) becomes inflamed, it is called tenosynovitis. Tendinitis commonly develops in people whose jobs require repetitive motions. SYMPTOMS  Pain.   Tenderness.   Mild swelling.  DIAGNOSIS Tendinitis is usually diagnosed by physical exam. Your caregiver may also order X-rays or other imaging tests. TREATMENT Your caregiver may recommend certain medicines or exercises for your treatment. HOME CARE INSTRUCTIONS   Use a sling or splint for as long as directed by your caregiver until the pain decreases.   Put ice on the injured area.   Put ice in a plastic bag.   Place a towel between your skin and the bag.   Leave the ice on for 15 to 20 minutes, 3 to 4 times a day.   Avoid using the limb while the tendon is painful. Perform gentle range of motion exercises only as directed by your caregiver. Stop exercises if pain or discomfort increase, unless directed otherwise by your caregiver.   Only take over-the-counter or prescription medicines for pain, discomfort, or fever as directed by your caregiver.  SEEK MEDICAL CARE IF:   Your pain and swelling increase.   You develop new, unexplained symptoms, especially increased numbness in the hands.  MAKE SURE YOU:   Understand these instructions.   Will watch your condition.   Will get help right away if you are not doing well or get worse.  Document Released: 10/26/2000 Document Revised: 07/11/2011 Document Reviewed: 01/15/2011 [facility] Patient Information 2012 [place], [place]. Ice three times daily and apply Voltaren twice daily Start Medrol dose pak tomorrow  call if no improvement

## 2011-10-26 NOTE — Assessment & Plan Note (Signed)
Adequately controlled, no changes 

## 2011-10-26 NOTE — Assessment & Plan Note (Addendum)
Left elbow swelling is down but she has increased pain over her wrist especially with internal and external rotation. Pain radiates up and down the arm from the wrist and to the shoulder at times. No point tenderness or weakness. Given a shot of Depo Medrol 20 mg once and then given a Medrol dose pack, also given a sample of Voltaren gel to use bid, given a splint for wrist as well asked to splint it as much as possible and to ice tid. Notify us if symptoms worsen or do not improve.

## 2011-10-29 NOTE — Assessment & Plan Note (Signed)
Accompanied by her husband today, continues to struggle with depression but feels Sertraline is helping some

## 2011-11-01 ENCOUNTER — Other Ambulatory Visit: Payer: Self-pay | Admitting: Family Medicine

## 2011-11-01 ENCOUNTER — Ambulatory Visit (HOSPITAL_BASED_OUTPATIENT_CLINIC_OR_DEPARTMENT_OTHER)
Admission: RE | Admit: 2011-11-01 | Discharge: 2011-11-01 | Disposition: A | Discharge status: Home / Self Care | Payer: Medicare HMO | Source: Ambulatory Visit | Attending: Family Medicine | Admitting: Family Medicine

## 2011-11-01 DIAGNOSIS — M25522 Pain in left elbow: Secondary | ICD-10-CM

## 2011-11-01 DIAGNOSIS — S52133A Displaced fracture of neck of unspecified radius, initial encounter for closed fracture: Secondary | ICD-10-CM

## 2011-11-01 DIAGNOSIS — X58XXXA Exposure to other specified factors, initial encounter: Secondary | ICD-10-CM

## 2011-11-01 DIAGNOSIS — M25429 Effusion, unspecified elbow: Secondary | ICD-10-CM

## 2011-11-01 DIAGNOSIS — W19XXXA Unspecified fall, initial encounter: Secondary | ICD-10-CM | POA: Insufficient documentation

## 2011-11-01 DIAGNOSIS — M25529 Pain in unspecified elbow: Secondary | ICD-10-CM | POA: Insufficient documentation

## 2011-11-01 NOTE — Progress Notes (Signed)
Quick Note:  Called pt and notified her of left radial neck fracture, appt with [facility] at 8:30 tomorrow morning. She mentioned her left wrist was hurting a lot, too. We did not know this when we ordered her arm x-ray today. I told her to make sure she mentions her wrist pain to Dr. Althea Charon at [facility] at her appt tomorrow and he would likely x-ray it at that time. She has tramadol for pain but it does not help any. I called vicodin 5/500, #30, in to [facility] on [address] in [place] tonight. --PM ______

## 2011-11-02 ENCOUNTER — Ambulatory Visit: Payer: Medicare HMO | Admitting: Family Medicine

## 2011-11-07 ENCOUNTER — Encounter: Payer: Self-pay | Admitting: Family Medicine

## 2011-11-09 ENCOUNTER — Other Ambulatory Visit: Payer: Self-pay

## 2011-11-09 ENCOUNTER — Telehealth: Payer: Self-pay

## 2011-11-09 DIAGNOSIS — C50919 Malignant neoplasm of unspecified site of unspecified female breast: Secondary | ICD-10-CM

## 2011-11-09 MED ORDER — ANASTROZOLE 1 MG PO TABS: 1.0000 mg | ORAL_TABLET | Freq: Every day | ORAL | Status: DC

## 2011-11-09 NOTE — Telephone Encounter (Signed)
Rec'd call from pt's husband stating that pt is out of arimidex, and shipment from specialty pharmacy is delayed, so he would like 15 or 30 day supply (if copay is same) called in to [facility] on [address] in [place].    Ok per Dr. Darrold Span.  Per [facility], they do not have this in stock.  Informed pt's husband, who said they live near [facility] and [facility].  Called in to [facility] on [address].  Called and left message on pt's home phone informing them prescription was called in here.

## 2011-11-11 ENCOUNTER — Other Ambulatory Visit: Payer: Self-pay | Admitting: Family Medicine

## 2011-11-12 NOTE — Telephone Encounter (Signed)
Please advise refill? 

## 2011-11-12 NOTE — Telephone Encounter (Signed)
Faxed to pharmacy

## 2011-11-21 ENCOUNTER — Telehealth: Payer: Self-pay

## 2011-11-21 NOTE — Telephone Encounter (Addendum)
Anne [name] CALLED STATING THAT Anne [name] SUFFERED A FX OF HE LEFT ARM 10-13-11. Anne [name] SAW DR. Althea Charon AT [facility].  HE MENTIONED THAT HER BONES WERE THIN AND WHAT WAS Anne [name] TAKING FOR THIS.  Anne [name] TOLD HIM NOTHING.   Anne [name] WAS AT THE DENTIST RECENTLY AND Anne [name] STATED THAT HER BONES ARE RECEDING IN HER MOUTH. THIS WEEK Anne [name] IS IS EXPERIENCING BILATERAL HIP PAIN. WHAT DOES DR. Darrold Span WANT HER TO DO FOR THE OSTEOPOROSIS? THE CALL IS AT 1430 WHICH IS BEFORE 4PM SO Anne [name] EXPECTS A RETURN CALL TODAY. CALLED PT. AT 1700 ON HOME PHONE.  LM THAT PHONE CALL WAS BEING RETURNED.  Anne [name] CAN CALL TO DISCUSS DR. Precious Reel SUGGESTIONS.

## 2011-11-22 ENCOUNTER — Telehealth: Payer: Self-pay

## 2011-11-22 ENCOUNTER — Telehealth: Payer: Self-pay | Admitting: Family Medicine

## 2011-11-22 NOTE — Telephone Encounter (Signed)
I would make sure she is not drinking alcohol in the evenings, make sure she has not recently stopped a medication that she had been taking long term, and make sure she's not taking any herbal supplements that we don't know about. Any new meds rx'd by anyone lately?  Any sleep deprivation lately? She may want to think about changing where she sleeps for short term to see if this helps.

## 2011-11-22 NOTE — Telephone Encounter (Signed)
Received call from pt stating that she was returning call from yesterday.  Informed pt per Dr. Darrold Span, the bone density scan she had done 09/14/2011 showed mild osteopenia, or mild decrease in bone density, stable from the scan Sept 2011.  She recommends at least 1200 mg calcium daily as TUMS (3 of tums ultra, 4 of tums 750, or 7 of tums 500).  Pt states she already takes 3 tabs tums 750 daily, so this RN instructed pt to increase to 4.  Also informed her per Dr. Darrold Span, her vitamin D level was great 1 year ago, and if Dr. Abner Greenspan has not re-checked it, we can do that with her next visit her.  Pt should continue to walk 2-5 miles daily, and we will send a copy of the bone density scan to Dr. Abner Greenspan.  Per Dr. Darrold Span, appt scheduled 03/06/11 can be moved to sooner, if pt wants to discuss.  Pt states that her vitamin D level was re-checked this past fall, and she would like appointment moved up to sooner date.  Informed her that this RN will call Dr. Mariel Aloe office to get vit D results faxed to Dr. Darrold Span, and note will be left for her to advise on when pt can be seen.  Informed pt office will call her back, and she verbalizes understanding.  Faxed bone density scan to Dr. Mariel Aloe office at [phone].  Called to get pt's recent vit D levels faxed to this office, and was placed on hold for ~6 min.  Vm left on triage vm requesting this information.  Note left for Dr. Darrold Span and desk RN to f/u.

## 2011-11-22 NOTE — Telephone Encounter (Signed)
Pt called stating she is having nightmares and waking up seeing people in her room? Pt states she sees a tall man and a short man humped over? Pt states she used to do this a few years ago? Pt stated that she wakes her husband up and as soon as he turns on the overhead light there gone? Please advise?

## 2011-11-22 NOTE — Telephone Encounter (Signed)
Patient advised and stated that was worth a try and thanks for helping

## 2011-11-22 NOTE — Telephone Encounter (Signed)
Pls send most recent Vitamin D level results to Tiffany at the [facility] fax [phone]

## 2011-11-22 NOTE — Telephone Encounter (Signed)
Please have her sleep in her son Chip's room.  I really don't know of anything else to recommend about this.

## 2011-11-22 NOTE — Telephone Encounter (Signed)
Pt states that Dr Su Hoff increased pts Tramadol 50 mg to 100 mg q 6 hrs prn? Pt states nothing else has changed with medication. Pt also stated that when she sleeps in her son Chips room she has never seen the two guys? Pt thinks that it is more psychological because when she is hurt that's when she sees them? Pt stated it started with her breast cancer then now with her broken arm?

## 2011-11-26 ENCOUNTER — Other Ambulatory Visit: Payer: Self-pay | Admitting: Family Medicine

## 2011-11-26 NOTE — Telephone Encounter (Signed)
Rx faxed to pharmacy  

## 2011-11-26 NOTE — Telephone Encounter (Signed)
Please advise Lorazepam refill? 

## 2011-11-28 ENCOUNTER — Telehealth: Payer: Self-pay

## 2011-11-28 ENCOUNTER — Other Ambulatory Visit: Payer: Self-pay

## 2011-11-28 DIAGNOSIS — M8580 Other specified disorders of bone density and structure, unspecified site: Secondary | ICD-10-CM

## 2011-11-28 DIAGNOSIS — Z853 Personal history of malignant neoplasm of breast: Secondary | ICD-10-CM

## 2011-11-28 NOTE — Telephone Encounter (Signed)
TOLD MS. Wilsey THAT DR. Darrold Span WOULD SEE HER SOONER THAN HER April 24 TH APPT. TO DISCUSS BONE DENSITY WITH DR. Darrold Span.  SCHEDULED PT FOR 01-02-12 X 45 MIN. PER DR. Darrold Span.  PT. WILL HAVE CBC, C-MET , AND A VITAMIN D LEVEL DRAWN AT THAT TIME. PT. STATED THAT SHE WILL CALL HER PLASTIC SURGEON CONCERNING THE PAIN IN HER RIGHT SIDE WHERE BREAST IMPLANT WAS PLACED.  HE DRAINED FLUID OUT OF THE IMPLANT ~ 2 MONTHS A GO AND THIS COULD BE THE PROBLEM.

## 2011-11-29 ENCOUNTER — Telehealth: Payer: Self-pay

## 2011-11-29 NOTE — Telephone Encounter (Signed)
FYI: Pain (sharp - comes and goes) in right chest. Right breast is full of saline. Smoked from age 71 to 35 yrs ago. Pt called plastic surgeon and he doesn't think that its from the stage 4 breast cancer. Pt would like to come in on Friday to see Dr Abner Greenspan. Will schedule.

## 2011-11-30 ENCOUNTER — Encounter: Payer: Self-pay | Admitting: Family Medicine

## 2011-11-30 ENCOUNTER — Ambulatory Visit (HOSPITAL_BASED_OUTPATIENT_CLINIC_OR_DEPARTMENT_OTHER)
Admission: RE | Admit: 2011-11-30 | Discharge: 2011-11-30 | Disposition: A | Discharge status: Home / Self Care | Payer: MEDICARE | Source: Ambulatory Visit | Attending: Family Medicine | Admitting: Family Medicine

## 2011-11-30 ENCOUNTER — Ambulatory Visit (INDEPENDENT_AMBULATORY_CARE_PROVIDER_SITE_OTHER): Payer: MEDICARE | Admitting: Family Medicine

## 2011-11-30 VITALS — BP 101/67 | HR 95 | Temp 97.6°F | Ht 66.0 in | Wt 130.4 lb

## 2011-11-30 DIAGNOSIS — R0789 Other chest pain: Secondary | ICD-10-CM | POA: Insufficient documentation

## 2011-11-30 DIAGNOSIS — R071 Chest pain on breathing: Secondary | ICD-10-CM

## 2011-11-30 DIAGNOSIS — Z853 Personal history of malignant neoplasm of breast: Secondary | ICD-10-CM

## 2011-11-30 DIAGNOSIS — E7800 Pure hypercholesterolemia, unspecified: Secondary | ICD-10-CM

## 2011-11-30 HISTORY — DX: Other chest pain: R07.89

## 2011-11-30 NOTE — Assessment & Plan Note (Signed)
Patient agrees to come in soon for fasting labs prior to seeing cardiology for follow up

## 2011-11-30 NOTE — Progress Notes (Signed)
Patient ID: Anne [name], female   DOB: 26-Nov-1940, 71 y.o.   MRN: [id number] Anne [name] [id number] Mar 25, 1941 11/30/2011      Progress Note-Follow Up  Subjective  Chief Complaint  Chief Complaint  Patient presents with  . Chest Pain    right side- sharp- comes and goes    HPI  Patient is a 71 yo Caucasian female in today for evaluation of a right-sided chest wall pain. It has been coming on for about 7 days. She denies any falls or trauma. She's had no cough, fevers or respiratory symptoms. The pain she describes is sharp and fleeting lasting only seconds. Worse with certain movements especially rotation. No radiation, diaphoresis, nausea, palpitations or shortness of breath is associated. No history of similar pain in the past.  Past Medical History  Diagnosis Date  . Anemia   . Anxiety   . COPD (chronic obstructive pulmonary disease)   . Hyperlipidemia   . Pneumonia 2-10    pleurisy  . Arthritis   . Depression   . GERD (gastroesophageal reflux disease)     improved s/p cholecystectomy and esophagus dilatation  . History of chicken pox   . History of shingles     2 episodes  . History of measles   . Osteopenia 03/14/2011  . 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 Pure hypercholesterolemia 10/17/2010  . Constipation 03/15/2011  . 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 GERD 09/29/2009  . ESOPHAGEAL STRICTURE 03/29/2009  . CONSTIPATION 09/29/2009  . CAROTID ARTERY STENOSIS 01/13/2010  . ANXIETY 09/29/2009  . ALKALINE PHOSPHATASE, ELEVATED 03/15/2009  . Anxiety and depression 04/28/2011  . Vaginitis 05/22/2011  . Diarrhea 05/22/2011  . Baker's cyst of knee 05/22/2011  . Trauma 10/18/2011  . Radial neck fracture 10/2011    minimally displaced    Past Surgical History  Procedure Date  . Anterior cruciate ligament repair 08, 09, 10  . Ercp, cbd stone extraction 2010  . Cholecystectomy 2010  . Egd/ dili 2010  . Breast surgery 2001, 2008   lumpectomy (01), Mastectomy modified radical (08), breast reconstruction, CA lesions excised lateral abd wall 2010    Family History  Problem Relation Age of Onset  . Heart disease Father   . Cancer Father     lung cancer/smoker  . Cirrhosis Sister     Primary Biliary  . Cancer Other     Breast, ovarian, pancreatic  . Stroke Maternal Grandmother   . Alcohol abuse Maternal Grandfather   . Heart disease Paternal Grandfather   . Anxiety disorder Sister   . Osteoporosis Sister   . Cancer Sister     skin  . Osteoporosis Sister   . Other Mother     tic deleauroux    History   Social History  . Marital Status: Married    Spouse Name: N/A    Number of Children: N/A  . Years of Education: N/A   Occupational History  . Not on file.   Social History Main Topics  . Smoking status: Former Smoker    Types: Cigarettes    Quit date: 05/22/2007  . Smokeless tobacco: Never Used  . Alcohol Use: Yes     6 oz daily  . Drug Use: No  . Sexually Active: Yes -- Female partner(s)   Other Topics Concern  . Not on file   Social History Narrative  . No narrative on file    Current Outpatient Prescriptions  on File Prior to Visit  Medication Sig Dispense Refill  . amitriptyline (ELAVIL) 150 MG tablet Take 1 tablet (150 mg total) by mouth at bedtime.  90 tablet  3  . anastrozole (ARIMIDEX) 1 MG tablet Take 1 tablet (1 mg total) by mouth daily.  30 tablet  0  . aspirin 81 MG tablet Take 81 mg by mouth daily.        [name] atorvastatin (LIPITOR) 40 MG tablet take 1 tablet by mouth at bedtime  30 tablet  6  . beta carotene 11914 UNIT capsule Take 25,000 Units by mouth daily.        . Calcium Citrate (CITRACAL PO) Take 500 mg by mouth daily.        . Cholecalciferol (VITAMIN D3) 1000 UNITS CAPS Take 2 tablets by mouth daily.        . diclofenac sodium (VOLTAREN) 1 % GEL Apply 1 application topically 2 (two) times daily as needed.  1 Tube  0  . FERRETTS 325 (106 FE) MG TABS take 1 tablet by mouth  once daily  30 tablet  1  . folic acid (FOLVITE) 1 MG tablet Take 1 tablet (1 mg total) by mouth daily.  30 tablet  11  . LORazepam (ATIVAN) 1 MG tablet take 1 tablet by mouth twice a day if needed  40 tablet  0  . methylPREDNIsolone (MEDROL DOSPACK) 4 MG tablet Take 1 tablet (4 mg total) by mouth daily. follow package directions  21 tablet  0  . NON FORMULARY ZINC OXIDE 50 MG DAILY       . omeprazole (PRILOSEC) 20 MG capsule Take 1 capsule (20 mg total) by mouth 2 (two) times daily.  60 capsule  0  . pyridOXINE (VITAMIN B-6) 50 MG tablet Take 50 mg by mouth 3 (three) times daily.        . sertraline (ZOLOFT) 100 MG tablet Take 1 tablet (100 mg total) by mouth daily.  30 tablet  2  . thiamine 100 MG tablet Take 1 tablet (100 mg total) by mouth daily.  30 tablet  11  . traMADol (ULTRAM) 50 MG tablet Take 100 mg by mouth every 6 (six) hours as needed.      . vitamin B-12 (CYANOCOBALAMIN) 1000 MCG tablet Take 1,000 mcg by mouth daily.          Allergies  Allergen Reactions  . Codeine   . Diphenhydramine Hcl   . Morphine     Review of Systems  Review of Systems  Constitutional: Negative for fever and malaise/fatigue.  HENT: Negative for congestion.   Eyes: Negative for discharge.  Respiratory: Negative for shortness of breath.   Cardiovascular: Negative for chest pain, palpitations and leg swelling.  Gastrointestinal: Negative for nausea, abdominal pain and diarrhea.  Genitourinary: Negative for dysuria.  Musculoskeletal: Negative for falls.  Skin: Negative for rash.  Neurological: Negative for loss of consciousness and headaches.  Endo/Heme/Allergies: Negative for polydipsia.  Psychiatric/Behavioral: Negative for depression and suicidal ideas. The patient is not nervous/anxious and does not have insomnia.     Objective  BP 101/67  Pulse 95  Temp(Src) 97.6 F (36.4 C) (Temporal)  Ht 5\' 6"  (1.676 m)  Wt 130 lb 6.4 oz (59.149 kg)  BMI 21.05 kg/m2  SpO2 95%  Physical  Exam  Physical Exam  Constitutional: She is oriented to person, place, and time and well-developed, well-nourished, and in no distress. No distress.  HENT:  Head: Normocephalic and atraumatic.  Eyes:  Conjunctivae are normal.  Neck: Neck supple. No thyromegaly present.  Cardiovascular: Normal rate, regular rhythm and normal heart sounds.   No murmur heard. Pulmonary/Chest: Effort normal and breath sounds normal. She has no wheezes.  Abdominal: She exhibits no distension and no mass.  Musculoskeletal: She exhibits no edema.  Lymphadenopathy:    She has no cervical adenopathy.  Neurological: She is alert and oriented to person, place, and time.  Skin: Skin is warm and dry. No rash noted. She is not diaphoretic.  Psychiatric: Memory, affect and judgment normal.     Lab Results  Component Value Date   WBC 6.6 09/12/2011   HGB 11.6* 09/12/2011   HCT 36.4 09/12/2011   MCV 95.8 09/12/2011   PLT 271 09/12/2011   Lab Results  Component Value Date   CREATININE 0.71 09/12/2011   BUN 18 09/12/2011   NA 138 09/12/2011   K 4.6 09/12/2011   CL 102 09/12/2011   CO2 26 09/12/2011   Lab Results  Component Value Date   ALT 21 09/12/2011   AST 21 09/12/2011   ALKPHOS 100 09/12/2011   BILITOT 0.3 09/12/2011   Lab Results  Component Value Date   CHOL 195 03/20/2011   Lab Results  Component Value Date   HDL 76.00 03/20/2011   Lab Results  Component Value Date   LDLCALC 94 03/20/2011   Lab Results  Component Value Date   TRIG 123.0 03/20/2011   Lab Results  Component Value Date   CHOLHDL 3 03/20/2011     Assessment & Plan   Atypical chest pain Right sided chest pain intemittently for 7 to 10 days, it is sharp and usually with movement, it shoots and resolves within seconds. Pain with palp over muscles of upper, anterior upper chest wall pain, reproducible with palpation. Will proceed with xray today for reassurance. Try applying Aspercreme bid. Gentle stretching.  PURE  HYPERCHOLESTEROLEMIA Patient agrees to come in soon for fasting labs prior to seeing cardiology for follow up

## 2011-11-30 NOTE — Assessment & Plan Note (Addendum)
Right sided chest pain intemittently for 7 to 10 days, it is sharp and usually with movement, it shoots and resolves within seconds. Pain with palp over muscles of upper, anterior upper chest wall pain, reproducible with palpation. Will proceed with xray today for reassurance. Try applying Aspercreme bid. Gentle stretching.

## 2011-11-30 NOTE — Patient Instructions (Signed)
Costochondritis Costochondritis (Tietze syndrome), or costochondral separation, is a swelling and irritation (inflammation) of the tissue (cartilage) that connects your ribs with your breastbone (sternum). It may occur on its own (spontaneously), through damage caused by an accident (trauma), or simply from coughing or minor exercise. It may take up to 6 weeks to get better and longer if you are unable to be conservative in your activities. HOME CARE INSTRUCTIONS   Avoid exhausting physical activity. Try not to strain your ribs during normal activity. This would include any activities using chest, belly (abdominal), and side muscles, especially if heavy weights are used.   Use ice for 15 to 20 minutes per hour while awake for the first 2 days. Place the ice in a plastic bag, and place a towel between the bag of ice and your skin.   Only take over-the-counter or prescription medicines for pain, discomfort, or fever as directed by your caregiver.  SEEK IMMEDIATE MEDICAL CARE IF:   Your pain increases or you are very uncomfortable.   You have a fever.   You develop difficulty with your breathing.   You cough up blood.   You develop worse chest pains, shortness of breath, sweating, or vomiting.   You develop new, unexplained problems (symptoms).  MAKE SURE YOU:   Understand these instructions.   Will watch your condition.   Will get help right away if you are not doing well or get worse.  Document Released: 08/08/2005 Document Revised: 07/11/2011 Document Reviewed: 06/16/2008 Exit[employer] Patient Information 2012 [employer]. 

## 2011-12-04 ENCOUNTER — Other Ambulatory Visit: Payer: Self-pay

## 2011-12-04 ENCOUNTER — Telehealth: Payer: Self-pay

## 2011-12-04 ENCOUNTER — Other Ambulatory Visit: Payer: Self-pay | Admitting: Oncology

## 2011-12-04 DIAGNOSIS — Z853 Personal history of malignant neoplasm of breast: Secondary | ICD-10-CM

## 2011-12-04 MED ORDER — ANASTROZOLE 1 MG PO TABS: 1.0000 mg | ORAL_TABLET | Freq: Every day | ORAL | Status: DC
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
## 2011-12-04 NOTE — Telephone Encounter (Signed)
HUSBAND TO P/U PRESCRIPTION FROM [facility] Thursday  12-06-11.

## 2011-12-05 ENCOUNTER — Ambulatory Visit (INDEPENDENT_AMBULATORY_CARE_PROVIDER_SITE_OTHER): Payer: MEDICARE | Admitting: Family Medicine

## 2011-12-05 ENCOUNTER — Ambulatory Visit: Payer: MEDICARE | Admitting: Family Medicine

## 2011-12-05 ENCOUNTER — Encounter: Payer: Self-pay | Admitting: Family Medicine

## 2011-12-05 DIAGNOSIS — F341 Dysthymic disorder: Secondary | ICD-10-CM

## 2011-12-05 DIAGNOSIS — F419 Anxiety disorder, unspecified: Secondary | ICD-10-CM

## 2011-12-05 DIAGNOSIS — F1010 Alcohol abuse, uncomplicated: Secondary | ICD-10-CM

## 2011-12-09 ENCOUNTER — Encounter: Payer: Self-pay | Admitting: Family Medicine
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
## 2011-12-15 ENCOUNTER — Other Ambulatory Visit: Payer: Self-pay | Admitting: Family Medicine

## 2011-12-17 ENCOUNTER — Ambulatory Visit (INDEPENDENT_AMBULATORY_CARE_PROVIDER_SITE_OTHER): Payer: No Typology Code available for payment source | Admitting: Psychiatry

## 2011-12-17 DIAGNOSIS — F1010 Alcohol abuse, uncomplicated: Secondary | ICD-10-CM

## 2011-12-17 DIAGNOSIS — F329 Major depressive disorder, single episode, unspecified: Secondary | ICD-10-CM

## 2011-12-18 ENCOUNTER — Other Ambulatory Visit: Payer: Self-pay | Admitting: Family Medicine

## 2011-12-18 NOTE — Telephone Encounter (Signed)
Provided this is not early she can have a 1 month supply with same sig, same amount but no further refills
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
## 2011-12-18 NOTE — Telephone Encounter (Signed)
Please advise Lorazepam refill? Pt got an RX for #[id number] on 11-26-11?

## 2011-12-21 ENCOUNTER — Ambulatory Visit (INDEPENDENT_AMBULATORY_CARE_PROVIDER_SITE_OTHER): Payer: MEDICARE | Admitting: Family Medicine

## 2011-12-21 ENCOUNTER — Other Ambulatory Visit: Payer: MEDICARE

## 2011-12-21 ENCOUNTER — Encounter: Payer: Self-pay | Admitting: Family Medicine

## 2011-12-21 DIAGNOSIS — D649 Anemia, unspecified: Secondary | ICD-10-CM

## 2011-12-21 DIAGNOSIS — R000 Tachycardia, unspecified: Secondary | ICD-10-CM

## 2011-12-21 DIAGNOSIS — M8580 Other specified disorders of bone density and structure, unspecified site: Secondary | ICD-10-CM

## 2011-12-21 DIAGNOSIS — F329 Major depressive disorder, single episode, unspecified: Secondary | ICD-10-CM

## 2011-12-21 DIAGNOSIS — E785 Hyperlipidemia, unspecified: Secondary | ICD-10-CM

## 2011-12-21 DIAGNOSIS — F418 Other specified anxiety disorders: Secondary | ICD-10-CM

## 2011-12-21 DIAGNOSIS — F341 Dysthymic disorder: Secondary | ICD-10-CM

## 2011-12-21 DIAGNOSIS — F1010 Alcohol abuse, uncomplicated: Secondary | ICD-10-CM

## 2011-12-21 DIAGNOSIS — E7800 Pure hypercholesterolemia, unspecified: Secondary | ICD-10-CM

## 2011-12-21 DIAGNOSIS — Z853 Personal history of malignant neoplasm of breast: Secondary | ICD-10-CM

## 2011-12-21 LAB — TSH: TSH: 3.51 u[IU]/mL (ref 0.35–5.50)

## 2011-12-21 LAB — RENAL FUNCTION PANEL
Albumin: 4.2 g/dL (ref 3.5–5.2)
Calcium: 9.2 mg/dL (ref 8.4–10.5)
Creatinine, Ser: 0.7 mg/dL (ref 0.4–1.2)
Glucose, Bld: 97 mg/dL (ref 70–99)
Phosphorus: 3.3 mg/dL (ref 2.3–4.6)
Sodium: 139 mEq/L (ref 135–145)

## 2011-12-21 LAB — CBC
RDW: 15.3 % — ABNORMAL HIGH (ref 11.5–14.6)
WBC: 5.2 10*3/uL (ref 4.5–10.5)

## 2011-12-21 LAB — LIPID PANEL
HDL: 79.8 mg/dL (ref >39.00)
LDL Cholesterol: 85 mg/dL (ref 0–99)
VLDL: 23.2 mg/dL (ref 0.0–40.0)

## 2011-12-21 LAB — HEPATIC FUNCTION PANEL
ALT: 28 U/L (ref 0–35)
Total Bilirubin: 0.7 mg/dL (ref 0.3–1.2)
Total Protein: 7 g/dL (ref 6.0–8.3)

## 2011-12-21 MED ORDER — SERTRALINE HCL 50 MG PO TABS: 50.0000 mg | ORAL_TABLET | Freq: Every day | ORAL | Status: DC

## 2011-12-21 MED ORDER — SERTRALINE HCL 50 MG PO TABS: ORAL_TABLET | ORAL | Status: DC

## 2011-12-21 NOTE — Patient Instructions (Signed)
Depression      Depression is a strong emotion of feeling unhappy that can last for weeks, months, or even longer. Depression causes problems with the ability to function in life. It upsets your:   · Relationships.   · Sleep.   · Eating habits.   · Work habits.   HOME CARE  · Take all medicine as told by your doctor.   · Talk with a therapist, counselor, or friend.   · Eat a healthy diet.   · Exercise regularly.   · Do not drink alcohol or use drugs.   GET HELP RIGHT AWAY IF:  You start to have thoughts about hurting yourself or others.  MAKE SURE YOU:  · Understand these instructions.   · Will watch your condition.   · Will get help right away if you are not doing well or get worse.   Document Released: 12/01/2010 Document Revised: 07/11/2011 Document Reviewed: 12/01/2010  [employer]® Patient Information ©2012 [employer].
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
## 2011-12-24 ENCOUNTER — Ambulatory Visit
Admission: RE | Admit: 2011-12-24 | Discharge: 2011-12-24 | Disposition: A | Discharge status: Home / Self Care | Payer: Medicare Other | Source: Ambulatory Visit | Attending: Oncology | Admitting: Oncology

## 2011-12-24 DIAGNOSIS — Z1231 Encounter for screening mammogram for malignant neoplasm of breast: Secondary | ICD-10-CM

## 2011-12-24 NOTE — Assessment & Plan Note (Signed)
Encouraged complete cessation, offered inpatient rehab. Patient declines but agrees to psychiatry referral

## 2011-12-24 NOTE — Assessment & Plan Note (Signed)
Improving with diet and exercise changes

## 2011-12-24 NOTE — Assessment & Plan Note (Signed)
Patient has recently gone back to see a counselor and Dr. Noe Gens. She is not assess her depression is worsening. She is very happy in her marriage and will she does not want to leave the home she says she does not always feel safe her husband is around. She agrees to referral to psychiatry today. She also agrees to an increase in circulating from 100 250. When we first started the medication with each dose adjustment she does improve for a period of time. Once again encouraged to try complete alcohol cessation. Patient is noncommittal. An hour and 15 minutes the patient and her husband over an hour at that time it's been strictly in counseling and discussing patient and husband asked options

## 2011-12-24 NOTE — Assessment & Plan Note (Signed)
Mild.

## 2011-12-31 ENCOUNTER — Encounter: Payer: Self-pay | Admitting: Family Medicine

## 2011-12-31 ENCOUNTER — Ambulatory Visit (INDEPENDENT_AMBULATORY_CARE_PROVIDER_SITE_OTHER): Payer: Medicare Other | Admitting: Family Medicine

## 2011-12-31 VITALS — BP 104/74 | HR 69 | Temp 97.6°F | Ht 66.0 in | Wt 127.0 lb

## 2011-12-31 DIAGNOSIS — F341 Dysthymic disorder: Secondary | ICD-10-CM

## 2011-12-31 DIAGNOSIS — F1010 Alcohol abuse, uncomplicated: Secondary | ICD-10-CM

## 2011-12-31 DIAGNOSIS — L739 Follicular disorder, unspecified: Secondary | ICD-10-CM

## 2011-12-31 DIAGNOSIS — G4700 Insomnia, unspecified: Secondary | ICD-10-CM

## 2011-12-31 DIAGNOSIS — L738 Other specified follicular disorders: Secondary | ICD-10-CM

## 2011-12-31 DIAGNOSIS — F329 Major depressive disorder, single episode, unspecified: Secondary | ICD-10-CM

## 2011-12-31 HISTORY — DX: Follicular disorder, unspecified: L73.9

## 2011-12-31 MED ORDER — SULFAMETHOXAZOLE-TRIMETHOPRIM 800-160 MG PO TABS: 1.0000 | ORAL_TABLET | Freq: Two times a day (BID) | ORAL | Status: AC

## 2011-12-31 MED ORDER — MUPIROCIN 2 % EX OINT: TOPICAL_OINTMENT | Freq: Every evening | CUTANEOUS | Status: AC | PRN

## 2011-12-31 NOTE — Assessment & Plan Note (Signed)
Warm compresses to right nares bid and Bactroban ointment qhs, Bactrim DS bid with increased probiotics and culture taken today

## 2011-12-31 NOTE — Patient Instructions (Signed)
Folliculitis      Folliculitis is an infection and inflammation of the hair follicles. Hair follicles become red and irritated. This inflammation is usually caused by bacteria. The bacteria thrive in warm, moist environments. This condition can be seen anywhere on the body.   CAUSES  The most common cause of folliculitis is an infection by germs (bacteria). Fungal and viral infections can also cause the condition. Viral infections may be more common in people whose bodies are unable to fight disease well (weakened immune systems). Examples include people with:  · AIDS.   · An organ transplant.   · Cancer.   People with depressed immune systems, diabetes, or obesity, have a greater risk of getting folliculitis than the general population. Certain chemicals, especially oils and tars, also can cause folliculitis.  SYMPTOMS  · An early sign of folliculitis is a small, white or yellow pus-filled, itchy lesion (pustule). These lesions appear on a red, inflamed follicle. They are usually less than 5 mm (.20 inches).   · The most likely starting points are the scalp, thighs, legs, back and buttocks. Folliculitis is also frequently found in areas of repeated shaving.   · When an infection of the follicle goes deeper, it becomes a boil or furuncle. A group of closely packed boils create a larger lesion (a carbuncle). These sores (lesions) tend to occur in hairy, sweaty areas of the body.   TREATMENT   · A doctor who specializes in skin problems (dermatologists) treats mild cases of folliculitis with antiseptic washes.   · They also use a skin application which kills germs (topical antibiotics). Tea tree oil is a good topical antiseptic as well. It can be found at a health food store. A small percentage of individuals may develop an allergy to the tea tree oil.   · Mild to moderate boils respond well to warm water compresses applied three times daily.   · In some cases, oral antibiotics should be taken with the skin treatment.    · If lesions contain large quantities of pus or fluid, your caregiver may drain them. This allows the topical antibiotics to get to the affected areas better.   · Stubborn cases of folliculitis may respond to laser hair removal. This process uses a high intensity light beam (a laser) to destroy the follicle and reduces the scarring from folliculitis. After laser hair removal, hair will no longer grow in the laser treated area.   Patients with long-lasting folliculitis need to find out where the infection is coming from. Germs can live in the nostrils of the patient. This can trigger an outbreak now and then. Sometimes the bacteria live in the nostrils of a family member. This person does not develop the disorder but they repeatedly re-expose others to the germ. To break the cycle of recurrence in the patient, the family member must also undergo treatment.  PREVENTION   · Individuals who are predisposed to folliculitis should be extremely careful about personal hygiene.   · Application of antiseptic washes may help prevent recurrences.   · A topical antibiotic cream, mupirocin (Bactroban®), has been effective at reducing bacteria in the nostrils. It is applied inside the nose with your little finger. This is done twice daily for a week. Then it is repeated every 6 months.   · Because follicle disorders tend to come back, patients must receive follow-up care. Your caregiver may be able to recognize a recurrence before it becomes severe.   SEEK IMMEDIATE MEDICAL CARE   IF:   · You develop redness, swelling, or increasing pain in the area.   · You have a fever.   · You are not improving with treatment or are getting worse.   · You have any other questions or concerns.   Document Released: 01/07/2002 Document Revised: 07/11/2011 Document Reviewed: 11/03/2008  [employer]® Patient Information ©2012 [employer].

## 2011-12-31 NOTE — Progress Notes (Signed)
Patient ID: Anne [name], female   DOB: February 08, 1941, 71 y.o.   MRN: [id number] RANELLE AUKER [id number] 01-Apr-1941 12/31/2011      Progress Note-Follow Up  Subjective  Chief Complaint  Chief Complaint  Patient presents with  . infection in nose    HPI  Patient is a 71 year old Caucasian female who is in today for evaluation of a sore in her right nares. It is tender red and swollen. It did drain. She denies fevers, chills or persistent congestion. No cough, sore throat, chest pain, palpitations, shortness of breath, GI or GU complaints. She continues to talk about her bad marriage and how she would like her husband moved out. She is unwilling to consider moving on herself over getting a lawyer involved at this time.  Past Medical History  Diagnosis Date  . Anemia   . Anxiety   . COPD (chronic obstructive pulmonary disease)   . Hyperlipidemia   . Pneumonia 2-10    pleurisy  . Arthritis   . Depression   . GERD (gastroesophageal reflux disease)     improved s/p cholecystectomy and esophagus dilatation  . History of chicken pox   . History of shingles     2 episodes  . History of measles   . Osteopenia 03/14/2011  . 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 Pure hypercholesterolemia 10/17/2010  . Constipation 03/15/2011  . 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 GERD 09/29/2009  . ESOPHAGEAL STRICTURE 03/29/2009  . CONSTIPATION 09/29/2009  . CAROTID ARTERY STENOSIS 01/13/2010  . ANXIETY 09/29/2009  . ALKALINE PHOSPHATASE, ELEVATED 03/15/2009  . Anxiety and depression 04/28/2011  . Vaginitis 05/22/2011  . Diarrhea 05/22/2011  . Baker's cyst of knee 05/22/2011  . Trauma 10/18/2011  . Radial neck fracture 10/2011    minimally displaced  . Atypical chest pain 11/30/2011  . Folliculitis of nose 12/31/2011    Past Surgical History  Procedure Date  . Anterior cruciate ligament repair 08, 09, 10  . Ercp, cbd stone extraction 2010  . Cholecystectomy 2010  . Egd/  dili 2010  . Breast surgery 2001, 2008    lumpectomy (01), Mastectomy modified radical (08), breast reconstruction, CA lesions excised lateral abd wall 2010    Family History  Problem Relation Age of Onset  . Heart disease Father   . Cancer Father     lung cancer/smoker  . Cirrhosis Sister     Primary Biliary  . Cancer Other     Breast, ovarian, pancreatic  . Stroke Maternal Grandmother   . Alcohol abuse Maternal Grandfather   . Heart disease Paternal Grandfather   . Anxiety disorder Sister   . Osteoporosis Sister   . Cancer Sister     skin  . Osteoporosis Sister   . Other Mother     tic deleauroux    History   Social History  . Marital Status: Married    Spouse Name: N/A    Number of Children: N/A  . Years of Education: N/A   Occupational History  . Not on file.   Social History Main Topics  . Smoking status: Former Smoker    Types: Cigarettes    Quit date: 05/22/2007  . Smokeless tobacco: Never Used  . Alcohol Use: Yes     6 oz daily  . Drug Use: No  . Sexually Active: Yes -- Female partner(s)   Other Topics Concern  . Not on file   Social History Narrative  .  No narrative on file    Current Outpatient Prescriptions on File Prior to Visit  Medication Sig Dispense Refill  . amitriptyline (ELAVIL) 150 MG tablet Take 1 tablet (150 mg total) by mouth at bedtime.  90 tablet  3  . anastrozole (ARIMIDEX) 1 MG tablet Take 1 tablet (1 mg total) by mouth daily.  90 tablet  1  . aspirin 81 MG tablet Take 81 mg by mouth daily.        [name] atorvastatin (LIPITOR) 40 MG tablet take 1 tablet by mouth at bedtime  30 tablet  6  . beta carotene 45409 UNIT capsule Take 25,000 Units by mouth daily.        . Calcium Citrate (CITRACAL PO) Take 500 mg by mouth daily.        . Cholecalciferol (VITAMIN D3) 1000 UNITS CAPS Take 2 tablets by mouth daily.        [name] FERRETTS 325 (106 FE) MG TABS take 1 tablet by mouth once daily  30 tablet  1  . folic acid (FOLVITE) 1 MG tablet Take 1  tablet (1 mg total) by mouth daily.  30 tablet  11  . LORazepam (ATIVAN) 1 MG tablet take 1 tablet by mouth twice a day if needed  40 tablet  0  . NON FORMULARY ZINC OXIDE 50 MG DAILY       . omeprazole (PRILOSEC) 20 MG capsule Take 1 capsule (20 mg total) by mouth 2 (two) times daily.  60 capsule  0  . pyridOXINE (VITAMIN B-6) 50 MG tablet Take 50 mg by mouth 3 (three) times daily.        . sertraline (ZOLOFT) 50 MG tablet 3 tabs po daily  90 tablet  1  . thiamine 100 MG tablet Take 1 tablet (100 mg total) by mouth daily.  30 tablet  11  . vitamin B-12 (CYANOCOBALAMIN) 1000 MCG tablet Take 1,000 mcg by mouth daily.        . diclofenac sodium (VOLTAREN) 1 % GEL Apply 1 application topically 2 (two) times daily as needed.  1 Tube  0  . traMADol (ULTRAM) 50 MG tablet take 1 tablet by mouth every 8 hours if needed pain  30 tablet  1    Allergies  Allergen Reactions  . Codeine   . Diphenhydramine Hcl   . Morphine   . Zofran Other (See Comments)    HEADACHE    Review of Systems  Review of Systems  Constitutional: Negative for fever, chills and malaise/fatigue.  HENT: Negative for congestion.   Eyes: Negative for discharge.  Respiratory: Negative for shortness of breath.   Cardiovascular: Negative for chest pain, palpitations and leg swelling.  Gastrointestinal: Negative for nausea, abdominal pain and diarrhea.  Genitourinary: Negative for dysuria.  Musculoskeletal: Negative for falls.  Skin: Negative for rash.       Painful lesion started yesterday at anterior portion of right nares. Warm compresses have helped some, no drainage  Neurological: Negative for loss of consciousness and headaches.  Endo/Heme/Allergies: Negative for polydipsia.  Psychiatric/Behavioral: Negative for depression and suicidal ideas. The patient is not nervous/anxious and does not have insomnia.     Objective  BP 104/74  Pulse 69  Temp(Src) 97.6 F (36.4 C) (Temporal)  Ht 5\' 6"  (1.676 m)  Wt 127 lb  (57.607 kg)  BMI 20.50 kg/m2  SpO2 98%  Physical Exam  Physical Exam  Constitutional: She is oriented to person, place, and time and well-developed, well-nourished, and in  no distress. No distress.  HENT:  Head: Normocephalic and atraumatic.       3 mm raised, erythematous maculopapular lesion at anterior portion of right nares at septum with small pustular center. Debrided with peroxide and gauze and then cultured  Eyes: Conjunctivae are normal.  Neck: Neck supple. No thyromegaly present.  Cardiovascular: Normal rate, regular rhythm and normal heart sounds.   No murmur heard. Pulmonary/Chest: Effort normal and breath sounds normal. She has no wheezes.  Abdominal: She exhibits no distension and no mass.  Musculoskeletal: She exhibits no edema.  Lymphadenopathy:    She has no cervical adenopathy.  Neurological: She is alert and oriented to person, place, and time.  Skin: Skin is warm and dry. No rash noted. She is not diaphoretic.  Psychiatric: Memory, affect and judgment normal.    Lab Results  Component Value Date   TSH 3.51 12/21/2011   Lab Results  Component Value Date   WBC 5.2 12/21/2011   HGB 12.1 12/21/2011   HCT 35.7* 12/21/2011   MCV 95.1 12/21/2011   PLT 237.0 12/21/2011   Lab Results  Component Value Date   CREATININE 0.7 12/21/2011   BUN 21 12/21/2011   NA 139 12/21/2011   K 4.2 12/21/2011   CL 102 12/21/2011   CO2 29 12/21/2011   Lab Results  Component Value Date   ALT 28 12/21/2011   AST 28 12/21/2011   ALKPHOS 96 12/21/2011   BILITOT 0.7 12/21/2011   Lab Results  Component Value Date   CHOL 188 12/21/2011   Lab Results  Component Value Date   HDL 79.80 12/21/2011   Lab Results  Component Value Date   LDLCALC 85 12/21/2011   Lab Results  Component Value Date   TRIG 116.0 12/21/2011   Lab Results  Component Value Date   CHOLHDL 2 12/21/2011     Assessment & Plan  Folliculitis of nose Warm compresses to right nares bid and Bactroban ointment qhs, Bactrim DS bid with  increased probiotics and culture taken today  Alcohol abuse Patient does not talk about wanting to cut back today but is encouraged to consider stopping to help her state of mind and her legal case if she chooses to pursue leaving her husband and trying to get him out of the house.  Anxiety and depression Very frustrated with her marriage and continues to say she does not trust or like her husband at this point. She would like to get him out of the house but does not have any plans to file for divorce or seek any legal action. She is encouraged to do this due to the longstanding nature of the disagreements she has with her husband

## 2011-12-31 NOTE — Assessment & Plan Note (Signed)
Very frustrated with her marriage and continues to say she does not trust or like her husband at this point. She would like to get him out of the house but does not have any plans to file for divorce or seek any legal action. She is encouraged to do this due to the longstanding nature of the disagreements she has with her husband

## 2011-12-31 NOTE — Assessment & Plan Note (Signed)
Patient does not talk about wanting to cut back today but is encouraged to consider stopping to help her state of mind and her legal case if she chooses to pursue leaving her husband and trying to get him out of the house.

## 2012-01-01 ENCOUNTER — Other Ambulatory Visit: Payer: Self-pay | Admitting: Gastroenterology

## 2012-01-01 ENCOUNTER — Other Ambulatory Visit: Payer: Self-pay | Admitting: Internal Medicine

## 2012-01-01 ENCOUNTER — Telehealth: Payer: Self-pay | Admitting: Internal Medicine

## 2012-01-01 MED ORDER — OMEPRAZOLE 20 MG PO CPDR: 20.0000 mg | DELAYED_RELEASE_CAPSULE | Freq: Two times a day (BID) | ORAL | Status: DC

## 2012-01-01 MED ORDER — AMITRIPTYLINE HCL 150 MG PO TABS: 150.0000 mg | ORAL_TABLET | Freq: Every day | ORAL | Status: DC

## 2012-01-01 NOTE — Telephone Encounter (Signed)
Omeprazole refilled and sent to mail order pharmacy ([facility]) fax # [phone]. #[phone]

## 2012-01-01 NOTE — Telephone Encounter (Signed)
Pt wanted to know if Dr Tenny Craw had reviewed her lab work that was done 2/8.  The results are in the system and I will forward this to her to review pt's labs.

## 2012-01-01 NOTE — Telephone Encounter (Signed)
Pt want to know if we have received blood work results from [facility]?

## 2012-01-02 ENCOUNTER — Ambulatory Visit (HOSPITAL_BASED_OUTPATIENT_CLINIC_OR_DEPARTMENT_OTHER): Payer: Medicare Other | Admitting: Oncology

## 2012-01-02 ENCOUNTER — Telehealth: Payer: Self-pay | Admitting: *Deleted

## 2012-01-02 ENCOUNTER — Other Ambulatory Visit (HOSPITAL_BASED_OUTPATIENT_CLINIC_OR_DEPARTMENT_OTHER): Payer: Medicare Other | Admitting: Lab

## 2012-01-02 VITALS — BP 110/72 | HR 84 | Temp 97.5°F | Ht 66.0 in | Wt 126.5 lb

## 2012-01-02 DIAGNOSIS — M8580 Other specified disorders of bone density and structure, unspecified site: Secondary | ICD-10-CM

## 2012-01-02 DIAGNOSIS — M949 Disorder of cartilage, unspecified: Secondary | ICD-10-CM

## 2012-01-02 DIAGNOSIS — C50919 Malignant neoplasm of unspecified site of unspecified female breast: Secondary | ICD-10-CM

## 2012-01-02 DIAGNOSIS — Z79811 Long term (current) use of aromatase inhibitors: Secondary | ICD-10-CM

## 2012-01-02 DIAGNOSIS — F1010 Alcohol abuse, uncomplicated: Secondary | ICD-10-CM

## 2012-01-02 DIAGNOSIS — Z853 Personal history of malignant neoplasm of breast: Secondary | ICD-10-CM

## 2012-01-02 LAB — CBC WITH DIFFERENTIAL/PLATELET
BASO%: 0.3 % (ref 0.0–2.0)
EOS%: 2.4 % (ref 0.0–7.0)
MCH: 32 pg (ref 25.1–34.0)
MCHC: 34.1 g/dL (ref 31.5–36.0)
RBC: 3.69 10*6/uL — ABNORMAL LOW (ref 3.70–5.45)
RDW: 15.2 % — ABNORMAL HIGH (ref 11.2–14.5)
lymph#: 1.3 10*3/uL (ref 0.9–3.3)

## 2012-01-02 LAB — COMPREHENSIVE METABOLIC PANEL
ALT: 48 U/L — ABNORMAL HIGH (ref 0–35)
AST: 35 U/L (ref 0–37)
Calcium: 9.3 mg/dL (ref 8.4–10.5)
Chloride: 103 mEq/L (ref 96–112)
Creatinine, Ser: 0.77 mg/dL (ref 0.50–1.10)
Potassium: 4.4 mEq/L (ref 3.5–5.3)
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
## 2012-01-03 ENCOUNTER — Other Ambulatory Visit: Payer: Self-pay | Admitting: Family Medicine

## 2012-01-03 ENCOUNTER — Ambulatory Visit (INDEPENDENT_AMBULATORY_CARE_PROVIDER_SITE_OTHER): Payer: Medicare Other | Admitting: Family Medicine

## 2012-01-03 VITALS — BP 106/73 | HR 88 | Temp 97.8°F | Ht 66.0 in | Wt 127.0 lb

## 2012-01-03 DIAGNOSIS — S139XXA Sprain of joints and ligaments of unspecified parts of neck, initial encounter: Secondary | ICD-10-CM

## 2012-01-03 DIAGNOSIS — S161XXA Strain of muscle, fascia and tendon at neck level, initial encounter: Secondary | ICD-10-CM | POA: Insufficient documentation

## 2012-01-03 LAB — WOUND CULTURE
Gram Stain: NONE SEEN
Gram Stain: NONE SEEN
Organism ID, Bacteria: NO GROWTH

## 2012-01-03 NOTE — Assessment & Plan Note (Signed)
Right>>left. Reassured.  Discussed usual self limited nature of this injury. Continue heat, NSAIDs, gentle ROM, relative rest.

## 2012-01-03 NOTE — Progress Notes (Signed)
OFFICE VISIT  01/03/2012   CC:  Chief Complaint  Patient presents with  . Neck Pain    since June, intermittent     HPI:    Patient is a 71 y.o. Caucasian female who presents with her husband present for neck pain. On and off over the last 8 mo she has noted periods of pain in lateral neck area, usually right side but occ left, described as sore with occasional shooting pains with movements of neck.  No radiation into arm or jaw.  No tingling, numbness, or weakness in UEs.  Raked leaves 2-3 hours yesterday. She was worried it may be pain from vascular congestion in right side of neck since she has hx of left sided carotid disease in past (although she admits this didn't cause any pain). She applied heat today and took two ibuprofen and the pain is essentially gone at this time.   Past Medical History  Diagnosis Date  . Anemia   . Anxiety   . COPD (chronic obstructive pulmonary disease)   . Hyperlipidemia   . Pneumonia 2-10    pleurisy  . Arthritis   . Depression   . GERD (gastroesophageal reflux disease)     improved s/p cholecystectomy and esophagus dilatation  . History of chicken pox   . History of shingles     2 episodes  . History of measles   . Osteopenia 03/14/2011  . 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 Pure hypercholesterolemia 10/17/2010  . Constipation 03/15/2011  . 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 GERD 09/29/2009  . ESOPHAGEAL STRICTURE 03/29/2009  . CONSTIPATION 09/29/2009  . CAROTID ARTERY STENOSIS 01/13/2010  . ANXIETY 09/29/2009  . ALKALINE PHOSPHATASE, ELEVATED 03/15/2009  . Anxiety and depression 04/28/2011  . Vaginitis 05/22/2011  . Diarrhea 05/22/2011  . Baker's cyst of knee 05/22/2011  . Trauma 10/18/2011  . Radial neck fracture 10/2011    minimally displaced  . Atypical chest pain 11/30/2011  . Folliculitis of nose 12/31/2011    Past Surgical History  Procedure Date  . Anterior cruciate ligament repair 08, 09, 10  . Ercp,  cbd stone extraction 2010  . Cholecystectomy 2010  . Egd/ dili 2010  . Breast surgery 2001, 2008    lumpectomy (01), Mastectomy modified radical (08), breast reconstruction, CA lesions excised lateral abd wall 2010  . Colonoscopy     Outpatient Prescriptions Prior to Visit  Medication Sig Dispense Refill  . amitriptyline (ELAVIL) 150 MG tablet Take 1 tablet (150 mg total) by mouth at bedtime.  90 tablet  3  . anastrozole (ARIMIDEX) 1 MG tablet Take 1 tablet (1 mg total) by mouth daily.  90 tablet  1  . aspirin 81 MG tablet Take 81 mg by mouth daily.        [name] atorvastatin (LIPITOR) 40 MG tablet take 1 tablet by mouth at bedtime  30 tablet  6  . beta carotene 29562 UNIT capsule Take 25,000 Units by mouth daily.        . calcium carbonate (TUMS - DOSED IN MG ELEMENTAL CALCIUM) 500 MG chewable tablet Chew 1 tablet by mouth 4 (four) times daily.      . Calcium Citrate (CITRACAL PO) Take 500 mg by mouth daily.        . Cholecalciferol (VITAMIN D3) 1000 UNITS CAPS Take 2 tablets by mouth daily.        [name] FERRETTS 325 (106 FE) MG TABS take  1 tablet by mouth once daily  30 tablet  1  . folic acid (FOLVITE) 1 MG tablet Take 1 tablet (1 mg total) by mouth daily.  30 tablet  11  . LORazepam (ATIVAN) 1 MG tablet take 1 tablet by mouth twice a day if needed  40 tablet  0  . mupirocin ointment (BACTROBAN) 2 % Apply topically at bedtime and may repeat dose one time if needed. Apply to each nares with clean qtip  22 g  1  . NON FORMULARY ZINC OXIDE 50 MG DAILY       . omeprazole (PRILOSEC) 20 MG capsule Take 1 capsule (20 mg total) by mouth 2 (two) times daily.  120 capsule  0  . pyridOXINE (VITAMIN B-6) 50 MG tablet Take 50 mg by mouth 3 (three) times daily.        . sertraline (ZOLOFT) 50 MG tablet 3 tabs po daily  90 tablet  1  . sulfamethoxazole-trimethoprim (BACTRIM DS) 800-160 MG per tablet Take 1 tablet by mouth 2 (two) times daily.  20 tablet  0  . thiamine 100 MG tablet Take 1 tablet (100 mg total)  by mouth daily.  30 tablet  11  . traMADol (ULTRAM) 50 MG tablet take 1 tablet by mouth every 8 hours if needed pain  30 tablet  1  . vitamin B-12 (CYANOCOBALAMIN) 1000 MCG tablet Take 1,000 mcg by mouth daily.          Allergies  Allergen Reactions  . Codeine   . Diphenhydramine Hcl   . Morphine   . Zofran Other (See Comments)    HEADACHE    ROS As per HPI  PE: Blood pressure 106/73, pulse 88, temperature 97.8 F (36.6 C), temperature source Temporal, height 5\' 6"  (1.676 m), weight 127 lb (57.607 kg). Gen: Alert, well appearing.  Patient is oriented to person, place, time, and situation. PERRLA, EOMI. NECK: No bruising or deformity. ROM intact without pain or stiffness except with lateral bending, to the left > right. Mild TTP in right SCM muscle bundle, with a minimal amount of the same on left. No LAD.  No TM.  Carotids easily palpable bilat, without bruit. Shoulders ROM fully intact without pain/stiffness.  Traps/shoulder girdles nontender. UE strength 5/5 prox/dist bilat.  Triceps/biceps/brachioradialis reflexes 2+ on right , tricepes and biceps 2+ on left, did not test brachioradialis on left due to pt wearing a forearm splint currently.  LABS:  none  IMPRESSION AND PLAN:  Strain of sternocleidomastoid muscle Right>>left. Reassured.  Discussed usual self limited nature of this injury. Continue heat, NSAIDs, gentle ROM, relative rest.     FOLLOW UP: Return if symptoms worsen or fail to improve.

## 2012-01-06 ENCOUNTER — Encounter: Payer: Self-pay | Admitting: Oncology

## 2012-01-07 ENCOUNTER — Telehealth: Payer: Self-pay | Admitting: Internal Medicine

## 2012-01-07 ENCOUNTER — Other Ambulatory Visit: Payer: Self-pay

## 2012-01-07 DIAGNOSIS — K200 Eosinophilic esophagitis: Secondary | ICD-10-CM

## 2012-01-07 NOTE — Telephone Encounter (Signed)
Left message for patient to call back  

## 2012-01-07 NOTE — Telephone Encounter (Signed)
Best if I have fluoro available for the endo also

## 2012-01-07 NOTE — Telephone Encounter (Signed)
REFERENCE  # [id number] TOLD PHARMACY TECH THAT PT. IS ON ARIMIDEX AND IT WAS FINE TO FILL PRESCRIPTION THAT PT. FAXED FOR 90 TABS AND 1 REFILL.

## 2012-01-07 NOTE — Telephone Encounter (Signed)
She has had severe dysphagia all weekend.  Has been getting progressively worse over the last few weeks.  She was eating yogurt yesterday and couldn't even get that down.  She is "afraid to eat".  I talked to her about EE tx and she said she was unable to take the medications properly.  "We just had great difficulty with that".  Dr Leone Payor please advise the next step.

## 2012-01-07 NOTE — Telephone Encounter (Signed)
Patient is scheduled for EGD with MAC and fluoro at 1:30 01/08/12.  Patient aware and verbalized understanding to be NPO after 9:30 in the am and be on a liquid diet until then.

## 2012-01-07 NOTE — Telephone Encounter (Signed)
She has eosinophilic esophagitis and is having recurrent dysphagia. Please contact her to triage things. May just need to get back on ingested fluticasone - would need propofol for EGD most likely

## 2012-01-07 NOTE — Telephone Encounter (Signed)
Need to arrange for an EGD with dilation - hopefully with Propofol - feel her out about that and see if we can do that in next 2 days at [facility] - if unable to get propofol will add Benadryl etc if she thinks she did not get adequate sedation last time  Liquid diet until then

## 2012-01-08 ENCOUNTER — Ambulatory Visit (HOSPITAL_COMMUNITY)
Admission: RE | Admit: 2012-01-08 | Discharge: 2012-01-08 | Disposition: A | Discharge status: Home / Self Care | Payer: Medicare Other | Source: Ambulatory Visit | Attending: Internal Medicine | Admitting: Internal Medicine

## 2012-01-08 ENCOUNTER — Encounter (HOSPITAL_COMMUNITY): Payer: Self-pay | Admitting: Anesthesiology

## 2012-01-08 ENCOUNTER — Other Ambulatory Visit: Payer: Self-pay | Admitting: Family Medicine

## 2012-01-08 ENCOUNTER — Ambulatory Visit (HOSPITAL_COMMUNITY): Payer: Medicare Other

## 2012-01-08 ENCOUNTER — Encounter (HOSPITAL_COMMUNITY)
Admission: RE | Disposition: A | Discharge status: Home / Self Care | Payer: Self-pay | Source: Ambulatory Visit | Attending: Internal Medicine

## 2012-01-08 ENCOUNTER — Ambulatory Visit (HOSPITAL_COMMUNITY): Payer: Medicare Other | Admitting: Anesthesiology

## 2012-01-08 ENCOUNTER — Encounter (HOSPITAL_COMMUNITY): Payer: Self-pay | Admitting: Internal Medicine

## 2012-01-08 DIAGNOSIS — Z9221 Personal history of antineoplastic chemotherapy: Secondary | ICD-10-CM | POA: Insufficient documentation

## 2012-01-08 DIAGNOSIS — E7800 Pure hypercholesterolemia, unspecified: Secondary | ICD-10-CM | POA: Insufficient documentation

## 2012-01-08 DIAGNOSIS — K222 Esophageal obstruction: Secondary | ICD-10-CM | POA: Insufficient documentation

## 2012-01-08 DIAGNOSIS — Z853 Personal history of malignant neoplasm of breast: Secondary | ICD-10-CM | POA: Insufficient documentation

## 2012-01-08 DIAGNOSIS — K200 Eosinophilic esophagitis: Secondary | ICD-10-CM | POA: Insufficient documentation

## 2012-01-08 DIAGNOSIS — R1310 Dysphagia, unspecified: Secondary | ICD-10-CM | POA: Insufficient documentation

## 2012-01-08 DIAGNOSIS — M899 Disorder of bone, unspecified: Secondary | ICD-10-CM | POA: Insufficient documentation

## 2012-01-08 DIAGNOSIS — J4489 Other specified chronic obstructive pulmonary disease: Secondary | ICD-10-CM | POA: Insufficient documentation

## 2012-01-08 DIAGNOSIS — K219 Gastro-esophageal reflux disease without esophagitis: Secondary | ICD-10-CM | POA: Insufficient documentation

## 2012-01-08 DIAGNOSIS — Z923 Personal history of irradiation: Secondary | ICD-10-CM | POA: Insufficient documentation

## 2012-01-08 DIAGNOSIS — J449 Chronic obstructive pulmonary disease, unspecified: Secondary | ICD-10-CM | POA: Insufficient documentation

## 2012-01-08 DIAGNOSIS — M949 Disorder of cartilage, unspecified: Secondary | ICD-10-CM | POA: Insufficient documentation

## 2012-01-08 DIAGNOSIS — E785 Hyperlipidemia, unspecified: Secondary | ICD-10-CM | POA: Insufficient documentation

## 2012-01-08 HISTORY — PX: ESOPHAGOGASTRODUODENOSCOPY: SHX5428

## 2012-01-08 HISTORY — PX: SAVORY DILATION: SHX5439

## 2012-01-08 SURGERY — EGD (ESOPHAGOGASTRODUODENOSCOPY)
Anesthesia: Monitor Anesthesia Care

## 2012-01-08 MED ORDER — MIDAZOLAM HCL 5 MG/5ML IJ SOLN
INTRAMUSCULAR | Status: DC | PRN
  Administered 2012-01-08: 2 mg via INTRAVENOUS

## 2012-01-08 MED ORDER — FENTANYL CITRATE 0.05 MG/ML IJ SOLN
INTRAMUSCULAR | Status: DC | PRN
  Administered 2012-01-08: 50 ug via INTRAVENOUS

## 2012-01-08 MED ORDER — LACTATED RINGERS IV SOLN
INTRAVENOUS | Status: DC | PRN
  Administered 2012-01-08: 13:00:00 via INTRAVENOUS

## 2012-01-08 MED ORDER — PROPOFOL 10 MG/ML IV EMUL
INTRAVENOUS | Status: DC | PRN
  Administered 2012-01-08: 300 ug/kg/min via INTRAVENOUS

## 2012-01-08 MED ORDER — PROMETHAZINE HCL 25 MG/ML IJ SOLN: 6.2500 mg | INTRAMUSCULAR | Status: DC | PRN

## 2012-01-08 MED ORDER — PANTOPRAZOLE SODIUM 40 MG PO TBEC: 40.0000 mg | DELAYED_RELEASE_TABLET | Freq: Every day | ORAL | Status: DC

## 2012-01-08 MED ORDER — FENTANYL CITRATE 0.05 MG/ML IJ SOLN: 25.0000 ug | INTRAMUSCULAR | Status: DC | PRN

## 2012-01-08 MED ORDER — LACTATED RINGERS IV SOLN
INTRAVENOUS | Status: DC
  Administered 2012-01-08: 1000 mL via INTRAVENOUS

## 2012-01-08 NOTE — Discharge Instructions (Addendum)
The esophagus was dilated again today. It should help your swallowing but you may need another dilation in the next month. Please drink only clear liquids until 4 PM today then may try soft foods. You may have some pain with swallowing. If you have severe chest pain call my office # and speak to the MD on call [phone]). Avoid very cold or very hot foods for next 24 hours. Try eating harder foods when you feel ready in the next 1-2 days - call with questions. [name], MD, [facility]  Endoscopy Care After Please read the instructions outlined below and refer to this sheet in the next few weeks. These discharge instructions provide you with general information on caring for yourself after you leave the hospital. Your doctor may also give you specific instructions. While your treatment has been planned according to the most current medical practices available, unavoidable complications occasionally occur. If you have any problems or questions after discharge, please call your doctor. HOME CARE INSTRUCTIONS Activity  You may resume your regular activity but move at a slower pace for the next 24 hours.   Take frequent rest periods for the next 24 hours.   Walking will help expel (get rid of) the air and reduce the bloated feeling in your abdomen.   No driving for 24 hours (because of the anesthesia (medicine) used during the test).   You may shower.   Do not sign any important legal documents or operate any machinery for 24 hours (because of the anesthesia used during the test).  Nutrition  Drink plenty of fluids.   You may resume your normal diet.   Begin with a light meal and progress to your normal diet.   Avoid alcoholic beverages for 24 hours or as instructed by your caregiver.  Medications You may resume your normal medications unless your caregiver tells you otherwise. What you can expect today  You may experience abdominal discomfort such as a feeling of fullness or "gas"  pains.   You may experience a sore throat for 2 to 3 days. This is normal. Gargling with salt water may help this.  Follow-up Your doctor will discuss the results of your test with you. SEEK IMMEDIATE MEDICAL CARE IF:  You have excessive nausea (feeling sick to your stomach) and/or vomiting.   You have severe abdominal pain and distention (swelling).   You have trouble swallowing.   You have a temperature over 100 F (37.8 C).   You have rectal bleeding or vomiting of blood.  Document Released: 06/12/2004 Document Revised: 07/11/2011 Document Reviewed: 12/24/2007 [facility] Patient Information 2012 [place], [place].

## 2012-01-08 NOTE — Progress Notes (Signed)
Hand written discharge instructions provided related to Epic system down.  Patient and husband voiced understanding.  See chart to view discharge instructions.

## 2012-01-08 NOTE — Transfer of Care (Signed)
Immediate Anesthesia Transfer of Care Note  Patient: Anne [name]  Procedure(s) Performed: Procedure(s) (LRB): ESOPHAGOGASTRODUODENOSCOPY (EGD) (N/A) SAVORY DILATION (N/A)  Patient Location: PACU  Anesthesia Type: MAC  Level of Consciousness: sedated and patient cooperative  Airway & Oxygen Therapy: Patient Spontanous Breathing and Patient connected to nasal cannula oxygen  Post-op Assessment: Report given to PACU RN and Post -op Vital signs reviewed and stable  Post vital signs: Reviewed and stable  Complications: No apparent anesthesia complications

## 2012-01-08 NOTE — Telephone Encounter (Signed)
Cannot find labs.
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
## 2012-01-08 NOTE — Op Note (Addendum)
[facility] [address] [place], [place]  [address]  ENDOSCOPY PROCEDURE REPORT  [name]:  Anne [name], Anne [name]  MR#:  [id number] BIRTHDATE:  04-05-1941, 70 yrs. old  GENDER:  female  ENDOSCOPIST:  [name], MD, [facility] ASSISTANT:  Olene Craven and Darral Dash, RN  PROCEDURE DATE:  01/08/2012 PROCEDURE:  Esophagoscopy, [name] ASA CLASS:  Class II INDICATIONS:  1) dilation of esophageal stricture recurrent dysphagia in setting of multiple esophageal rings and eosinophilic esophagitis  MEDICATIONS:   MAC sedation, administered by CRNA TOPICAL ANESTHETIC:  none  DESCRIPTION OF PROCEDURE:   After Anne risks benefits and alternatives of Anne procedure were thoroughly explained, informed consent was obtained.  Anne Pentax Gastroscope [id number] endoscope was introduced through Anne mouth and advanced to Anne stomach antrum, without limitations.  Anne instrument was slowly withdrawn as Anne mucosa was carefully examined. <<PROCEDUREIMAGES>>  A web was present in Anne proximal esophagus. Scope would not pass until dilated. See below.  Multiple rings were seen in Anne esophagus. in Anne mid esophagus.  An esophageal ring was found in Anne distal esophagus. Scope passed with manipulation.  Anne examination was otherwise normal.    Dilation was then performed at Anne total esophagus  1) Dilator:  [name]  Size(s):  40 French Resistance:  moderate  Heme:  yes Appearance:  satisfactory Initially passed 40 Fr dilator 25 cm without fluoro to dilate proximal web as in past. Scope then inserted revealing more distal findings. after endoscopic survey I passed Anne 60 [place] dilator with fluoro guidance. Reinspection showed successful dilation of proximal and distal strictures. Not much effect elsewhere.  COMPLICATIONS:  None  ENDOSCOPIC IMPRESSION: 1) Web in Anne proximal esophagus - dilated 40 Fr 2) Rings, multiple in Anne mid esophagus 3) Ring,  esophageal in Anne distal esophagus - dlated 40 Fr 4) Otherwise normal examination to antrum RECOMMENDATIONS: 1) Clear liquids until 4 PM today - avoid very cold or hot. May try very soft foods after that and gradually advance diet starting tomorrow. 2) Call Dr. Leone Payor to let him know how Anne swallowing is going in 2 weeks - may need more dilation.  Addendum: was out of omeprazole - 40 mg pantoprazole daily was prescribed. That was on formulary.  [name], MD, [facility]  CC:  Anne [name]  n. REVISED:  01/08/2012 02:34 PM eSIGNED:   [name] at 01/08/2012 02:34 PM  Farris Has, [phone]

## 2012-01-08 NOTE — Anesthesia Preprocedure Evaluation (Addendum)
Anesthesia Evaluation  Patient identified by MRN, date of birth, ID band Patient awake    Reviewed: Allergy & Precautions, H&P , NPO status , Patient's Chart, lab work & pertinent test results, reviewed documented beta blocker date and time   Airway Mallampati: II TM Distance: >3 FB Neck ROM: Full    Dental  (+) Lower Dentures and Partial Upper   Pulmonary COPD clear to auscultation        Cardiovascular neg cardio ROS Regular Normal Denies cardiac symptoms   Neuro/Psych Negative Neurological ROS  Negative Psych ROS   GI/Hepatic negative GI ROS, Neg liver ROS, dysphagia   Endo/Other  Negative Endocrine ROS  Renal/GU negative Renal ROS  Genitourinary negative   Musculoskeletal negative musculoskeletal ROS (+)   Abdominal   Peds negative pediatric ROS (+)  Hematology negative hematology ROS (+)   Anesthesia Other Findings   Reproductive/Obstetrics Hx Breast cancer                          Anesthesia Physical Anesthesia Plan  ASA: II  Anesthesia Plan: MAC   Post-op Pain Management:    Induction:   Airway Management Planned: Mask  Additional Equipment:   Intra-op Plan:   Post-operative Plan:   Informed Consent: I have reviewed the patients History and Physical, chart, labs and discussed the procedure including the risks, benefits and alternatives for the proposed anesthesia with the patient or authorized representative who has indicated his/her understanding and acceptance.     Plan Discussed with: Surgeon  Anesthesia Plan Comments:         Anesthesia Quick Evaluation

## 2012-01-08 NOTE — Anesthesia Postprocedure Evaluation (Signed)
  Anesthesia Post-op Note  Patient: Anne [name]  Procedure(s) Performed: Procedure(s) (LRB): ESOPHAGOGASTRODUODENOSCOPY (EGD) (N/A) SAVORY DILATION (N/A)  Patient Location: PACU  Anesthesia Type: General  Level of Consciousness: oriented and sedated  Airway and Oxygen Therapy: Patient Spontanous Breathing  Post-op Pain: mild  Post-op Assessment: Post-op Vital signs reviewed, Patient's Cardiovascular Status Stable, Respiratory Function Stable and Patent Airway  Post-op Vital Signs: stable  Complications: No apparent anesthesia complications

## 2012-01-09 ENCOUNTER — Encounter (HOSPITAL_COMMUNITY): Payer: Self-pay | Admitting: Internal Medicine

## 2012-01-09 ENCOUNTER — Other Ambulatory Visit: Payer: Self-pay

## 2012-01-09 MED ORDER — LORAZEPAM 1 MG PO TABS: ORAL_TABLET | ORAL | Status: DC

## 2012-01-09 NOTE — Telephone Encounter (Signed)
Patients spouse left a message stating that he was guilty for taking his wifes Lorazepam. Sam stated that since he has been sick he takes 1/2 tablet every night. Sam also stated that patient is completely out of the Lorazepam and really needs more sent into pharmacy? Please advise? [phone]

## 2012-01-09 NOTE — Telephone Encounter (Signed)
Pt states that she is completely out of her Lorazepam? Pt states that her husband keeps taking her medication? Pt would like a refill?

## 2012-01-09 NOTE — Telephone Encounter (Signed)
Lipid panel from 12/21/2011 sent to Dr.Ross.

## 2012-01-09 NOTE — Telephone Encounter (Signed)
Patient can have a refill on her Lorazepam same sig, same number no extra refill, her husband will have to call and make an appt to get his own supply

## 2012-01-09 NOTE — Telephone Encounter (Signed)
Pt informed and states understand ment. RX sent into pharmacy

## 2012-01-10 NOTE — Telephone Encounter (Signed)
CMA opened another phone note.

## 2012-01-21 ENCOUNTER — Ambulatory Visit (INDEPENDENT_AMBULATORY_CARE_PROVIDER_SITE_OTHER): Payer: Medicare Other | Admitting: Internal Medicine

## 2012-01-21 ENCOUNTER — Encounter: Payer: Self-pay | Admitting: Internal Medicine

## 2012-01-21 VITALS — BP 120/64 | HR 88 | Ht 66.0 in | Wt 124.0 lb

## 2012-01-21 DIAGNOSIS — K219 Gastro-esophageal reflux disease without esophagitis: Secondary | ICD-10-CM

## 2012-01-21 DIAGNOSIS — K200 Eosinophilic esophagitis: Secondary | ICD-10-CM

## 2012-01-21 DIAGNOSIS — K222 Esophageal obstruction: Secondary | ICD-10-CM

## 2012-01-21 NOTE — Patient Instructions (Signed)
We have scheduled you to see Dr. Maple Hudson on the second floor of the [facility] building on April 15 th at 1:30 pm.  Please arrive 10-15 minutes for registration.  If you need to call to cancel or reschedule call [phone]. You have been given a GERD diet handout today.

## 2012-01-21 NOTE — Progress Notes (Signed)
  Subjective:    Patient ID: Anne [name], female    DOB: 09-Jun-1941, 71 y.o.   MRN: [id number]  HPI This pleasant 71 year old white woman presents with her husband to followup after she underwent an urgent esophageal dilation last week. She has GERD and eosinophilic esophagitis. She had run out of her omeprazole. She had been prescribed ingested Flovent last year but could not really take that and says it's too cumbersome, so she did not. She did not followup is recommended. She had called in claiming severe dysphagia problems. She was added onto the schedule at the hospital and had an upper endoscopy with [facility] dilation under fluoroscopy, dilating a severe proximal web, showed a multi-ring the esophagus and a distal esophageal ring as well that was dilated. As in the past, a 70 [place] dilation was performed. She reports complete or near complete resolution of her dysphagia. She is not interested in another dilation at this time. She has been started on pantoprazole 40 mg daily. She is not taking that regularly before meals and that is re\re recommended to the patient today.  She claims reduced alcohol intake at this time.  When queried about possible food allergy she says that Citrus and tomato products bother her and make her mouth pucker in her chest burn. She is narrowing of any swelling or chew allergic reactions with those. She tends to avoid them.  Past medical history, past surgical history, medications and allergies are reviewed in the electronic medical record and updated. Social history also. Review of Systems As above    Objective:   Physical Exam Thin but well-developed well-nourished no acute distress Mood and affect appear appropriate.       Assessment & Plan:   1. Eosinophilic esophagitis   Improved after dilation. She declines ingested Flovent. We'll have her undergo an allergy evaluation to see if we can learn anything about food avoidance. She understands the  possible need for repeated dilation and the need to stay on a PPI therapy. Depending upon the coarse, could consider alternative allergy therapy but she is not interested in having other medications at this time.   2. Esophageal strictures   Multiple, related to GERD and eosinophilic esophagitis. Markedly improved dysphagia after dilation with 70 [place] dilator. We discussed the possibility of dilating to a larger diameter but she feels comfortable that she is swallowing everything she needs do at this point so will hold off and wait for recurrent dysphagia problems. I advised her to call me sooner the next time.   3.    GERD (gastroesophageal reflux disease)   She had run out of her PPI in the months prior to her recent EGD and dilation. I have explained to her the need to continue to take that. We did discuss the possibility of bone loss and osteoporosis and is resultant complications but she has severe esophageal reflux with strictures in the setting of eosinophilic esophagitis and needs to take a daily PPI. She may even need twice a day therapy but we don't have any proof that she needs that at this point so I have held off on increasing things. GERD diet is provided. She is clearly at least intolerant of [place] tomato-based foods which sounds like typical GERD-related issues other than some question of allergy in her mind. Allergy evaluation is pending.

## 2012-01-29 ENCOUNTER — Telehealth: Payer: Self-pay

## 2012-01-29 NOTE — Telephone Encounter (Signed)
PT. RECENTLY HAD DILATATION OF HER ESOPHAGUS AND DR. Leone Payor PRESCRIBED PROTONIX 40 MG DAILY FOR HER BUT HAS SOME BONE THINNING  PROPERTIES.  TOLD MS. COOL THAT DR. Darrold Span SAID THAT SHE NEEDED TO DISCUSS THIS CONCERN WITH DR. Leone Payor.  PT. VERBALIZED UNDERSTANDING.

## 2012-02-06 ENCOUNTER — Ambulatory Visit: Payer: Medicare HMO | Admitting: Hematology and Oncology

## 2012-02-12 ENCOUNTER — Other Ambulatory Visit: Payer: Self-pay | Admitting: Family Medicine

## 2012-02-12 ENCOUNTER — Other Ambulatory Visit: Payer: Self-pay

## 2012-02-12 MED ORDER — LORAZEPAM 1 MG PO TABS: ORAL_TABLET | ORAL | Status: DC

## 2012-02-12 NOTE — Telephone Encounter (Signed)
Patient would like the Lorazepam refilled? Last refilled on 01-09-12? Can we also put refills on this? Please advise?

## 2012-02-12 NOTE — Telephone Encounter (Signed)
RX faxed

## 2012-02-12 NOTE — Telephone Encounter (Signed)
OK to refill with same sig, same number and 2 rf on the Lorazepam

## 2012-02-14 ENCOUNTER — Ambulatory Visit (INDEPENDENT_AMBULATORY_CARE_PROVIDER_SITE_OTHER): Payer: Medicare Other | Admitting: Family Medicine

## 2012-02-14 ENCOUNTER — Encounter: Payer: Self-pay | Admitting: Family Medicine

## 2012-02-14 ENCOUNTER — Telehealth: Payer: Self-pay

## 2012-02-14 VITALS — BP 104/71 | HR 98 | Temp 97.6°F | Wt 127.0 lb

## 2012-02-14 DIAGNOSIS — M7989 Other specified soft tissue disorders: Secondary | ICD-10-CM

## 2012-02-14 NOTE — Assessment & Plan Note (Signed)
Right hand, acute, nontraumatic. No sign of infection.  I think she had a small ganglion cyst already that she was unaware of possibly, and this was smashed/hit while doing her work in garden/woods recently and this caused local inflamm rxn + hematoma, and since that time the hematoma has spread downward with gravity more distally into proximal and middle phalanges level.   Reassured pt, encouraged regular application of heat to see if the soft focal swelling drains, watch and wait and expect spontaneous resolution. No meds or testing indicated at this time.

## 2012-02-14 NOTE — Telephone Encounter (Signed)
Scheduled for appt. Due to pain in right hand

## 2012-02-14 NOTE — Progress Notes (Signed)
OFFICE NOTE  02/14/2012  CC:  Chief Complaint  Patient presents with  . Hand Injury    occurred Monday; was working in garden, did not notice any pain at the time, noticed swelling and discoloration when she got inside and was putting on lotion, discoloration is spreading     HPI: Patient is a 71 y.o. Caucasian female pt of Dr. Abner Greenspan who is here for right hand swelling.   Onset 4d/a after working in her garden and in the woods behind her house, had gloves on. Recalls no trauma or bite, but after she came in the house she noted the top of her right hand was swollen diffusely over metacarpal area.  This then shrunk down and she noted discoloration forming in proximal part of fingers, and a small subQ bump remained over her 4th metacarpal.  This spot is mildly tender to touch but not painful if left alone.  No itching.  She has normal strength and sensation in her hand. No other skin abnormalities. No fevers or malaise.   Pertinent PMH:  No hx of hand swelling in past, no hx of known hand problems of any kind.  MEDS:  Outpatient Prescriptions Prior to Visit  Medication Sig Dispense Refill  . amitriptyline (ELAVIL) 150 MG tablet Take 1 tablet (150 mg total) by mouth at bedtime.  90 tablet  3  . anastrozole (ARIMIDEX) 1 MG tablet Take 1 tablet (1 mg total) by mouth daily.  90 tablet  1  . aspirin 81 MG tablet Take 81 mg by mouth daily.        [name] atorvastatin (LIPITOR) 40 MG tablet take 1 tablet by mouth at bedtime  30 tablet  6  . beta carotene 04540 UNIT capsule Take 25,000 Units by mouth daily.        . calcium carbonate (TUMS - DOSED IN MG ELEMENTAL CALCIUM) 500 MG chewable tablet Chew 1 tablet by mouth 4 (four) times daily.      . Calcium Citrate (CITRACAL PO) Take 500 mg by mouth daily.        . Cholecalciferol (VITAMIN D3) 1000 UNITS CAPS Take 2 tablets by mouth daily.        [name] FERRETTS 325 (106 FE) MG TABS take 1 tablet by mouth once daily  30 tablet  1  . folic acid (FOLVITE) 1 MG  tablet Take 1 tablet (1 mg total) by mouth daily.  30 tablet  11  . LORazepam (ATIVAN) 1 MG tablet 1 tablet po twice a day if needed  40 tablet  2  . NON FORMULARY ZINC OXIDE 50 MG DAILY       . pantoprazole (PROTONIX) 40 MG tablet Take 1 tablet (40 mg total) by mouth daily. Take 30 minutes before breakfast  30 tablet  1  . pyridOXINE (VITAMIN B-6) 50 MG tablet Take 50 mg by mouth 3 (three) times daily.        . sertraline (ZOLOFT) 50 MG tablet Take 50 mg by mouth as directed.      . thiamine 100 MG tablet Take 1 tablet (100 mg total) by mouth daily.  30 tablet  11  . vitamin B-12 (CYANOCOBALAMIN) 1000 MCG tablet Take 1,000 mcg by mouth daily.          PE: Blood pressure 104/71, pulse 98, temperature 97.6 F (36.4 C), temperature source Temporal, weight 127 lb (57.607 kg). Gen: Alert, well appearing.  Patient is oriented to person, place, time, and situation. Right hand: dorsal  surface with mild violacious hue to skin.   Focal, soft, pea-sized sub Q nodule over 4th metacarpal that is mildly tender to touch.  No tenderness anywhere else on hand, fingers, or wrist.   Deep violacious hue that is nonblanching on dorsal surfaces of prox and middle phalanges.  No warmth. No erythema or streaking to suggest infection.  IMPRESSION AND PLAN:  Hand swelling Right hand, acute, nontraumatic. No sign of infection.  I think she had a small ganglion cyst already that she was unaware of possibly, and this was smashed/hit while doing her work in garden/woods recently and this caused local inflamm rxn + hematoma, and since that time the hematoma has spread downward with gravity more distally into proximal and middle phalanges level.   Reassured pt, encouraged regular application of heat to see if the soft focal swelling drains, watch and wait and expect spontaneous resolution. No meds or testing indicated at this time.      FOLLOW UP: prn

## 2012-02-19 ENCOUNTER — Other Ambulatory Visit: Payer: Self-pay | Admitting: Internal Medicine

## 2012-02-19 ENCOUNTER — Ambulatory Visit (INDEPENDENT_AMBULATORY_CARE_PROVIDER_SITE_OTHER): Payer: Medicare Other | Admitting: Family Medicine

## 2012-02-19 ENCOUNTER — Encounter: Payer: Self-pay | Admitting: Family Medicine

## 2012-02-19 VITALS — BP 111/69 | HR 90 | Temp 98.1°F | Ht 66.0 in | Wt 129.0 lb

## 2012-02-19 DIAGNOSIS — M79643 Pain in unspecified hand: Secondary | ICD-10-CM

## 2012-02-19 DIAGNOSIS — M6740 Ganglion, unspecified site: Secondary | ICD-10-CM

## 2012-02-19 DIAGNOSIS — IMO0002 Reserved for concepts with insufficient information to code with codable children: Secondary | ICD-10-CM

## 2012-02-19 DIAGNOSIS — M79609 Pain in unspecified limb: Secondary | ICD-10-CM

## 2012-02-19 NOTE — Progress Notes (Signed)
OFFICE NOTE  02/19/2012  CC:  Chief Complaint  Patient presents with  . Hand Pain    bruising is better, cyst is larger, pain behind knuckle of index finger.     HPI: Patient is a 71 y.o. Caucasian female who is here for 5 day f/u of hand swelling/lesion.  It has been 9d now since she noted swelling and discoloration after doing gardening and working outside. The swelling has resolved except one focal area that I thought was likely a ganglion cyst that had been traumatized--about 1-2 cm.  Diffuse ecchymoses present last exam are now resolved.  She has applied dry heat a couple of times a day for 20 min at a time. No fever, no malaise.    Pertinent PMH:  Past Medical History  Diagnosis Date  . Anemia   . COPD (chronic obstructive pulmonary disease)   . Hyperlipidemia   . Pneumonia 2-10    pleurisy  . Arthritis   . Depression   . GERD (gastroesophageal reflux disease) 09/29/2009    improved s/p cholecystectomy and esophagus dilatation  . History of chicken pox   . History of shingles     2 episodes  . History of measles   . Osteopenia 03/14/2011  . 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 Pure hypercholesterolemia 10/17/2010  . 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 ESOPHAGEAL STRICTURE 03/29/2009  . CAROTID ARTERY STENOSIS 01/13/2010  . ALKALINE PHOSPHATASE, ELEVATED 03/15/2009  . Anxiety and depression 04/28/2011  . Vaginitis 05/22/2011  . Baker's cyst of knee 05/22/2011  . Trauma 10/18/2011  . Radial neck fracture 10/2011    minimally displaced  . Atypical chest pain 11/30/2011  . Folliculitis of nose 12/31/2011  . Esophageal ring   . 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 amitriptyline (ELAVIL) 150 MG tablet Take 1 tablet (150 mg total) by mouth at bedtime.  90 tablet  3  . anastrozole (ARIMIDEX) 1 MG tablet Take 1 tablet (1 mg total) by mouth daily.  90  tablet  1  . aspirin 81 MG tablet Take 81 mg by mouth daily.        . beta carotene 16109 UNIT capsule Take 25,000 Units by mouth daily.        . calcium carbonate (TUMS - DOSED IN MG ELEMENTAL CALCIUM) 500 MG chewable tablet Chew 1 tablet by mouth 4 (four) times daily.      . Calcium Citrate (CITRACAL PO) Take 500 mg by mouth daily.        . Cholecalciferol (VITAMIN D3) 1000 UNITS CAPS Take 2 tablets by mouth daily.        [name] FERRETTS 325 (106 FE) MG TABS take 1 tablet by mouth once daily  30 tablet  1  . folic acid (FOLVITE) 1 MG tablet Take 1 tablet (1 mg total) by mouth daily.  30 tablet  11  . LORazepam (ATIVAN) 1 MG tablet 1 tablet po twice a day if needed  40 tablet  2  . NON FORMULARY ZINC OXIDE 50 MG DAILY       . pantoprazole (PROTONIX) 40 MG tablet Take 1 tablet (40 mg total) by mouth daily. Take 30 minutes before breakfast  30 tablet  1  . pyridOXINE (VITAMIN B-6) 50 MG tablet Take 50 mg by mouth 3 (three) times daily.        [name]  sertraline (ZOLOFT) 50 MG tablet Take 50 mg by mouth as directed.      . thiamine 100 MG tablet Take 1 tablet (100 mg total) by mouth daily.  30 tablet  11  . vitamin B-12 (CYANOCOBALAMIN) 1000 MCG tablet Take 1,000 mcg by mouth daily.          PE: Blood pressure 111/69, pulse 90, temperature 98.1 F (36.7 C), temperature source Temporal, height 5\' 6"  (1.676 m), weight 129 lb (58.514 kg). Gen: Alert, well appearing.  Patient is oriented to person, place, time, and situation. Right hand without erythema or ecchymoses. She has a small, soft subQ nodule over distal 4th metacarpal bone that is mildly fluctuant, mildly TTP, a bit pinkish hue on skin surface over this.  Question of whether this moves in conjunction with finger extension tendeon movement.  No surrounding erythema, no streaking, no warmth.  ROM of fingers/wrists all intact.    IMPRESSION AND PLAN:  Suspect traumatized ganglion cyst.  I offered to aspirate this to see if this helped it feel better  but she declined, opting for more conservative mgmt with moist heat/soaks several times per day and RELATIVE rest via immobilization with wrist splint.  She says she will be unable to wear it much b/c of having to use her hands all the time for various things, but she'll try as much as she can. I reiterated that I see no significant sign of infection, although if I did aspirate the localized swelling area in the future I would send for c/s.  No meds rx'd today.  FOLLOW UP: prn

## 2012-02-20 ENCOUNTER — Telehealth: Payer: Self-pay

## 2012-02-20 ENCOUNTER — Other Ambulatory Visit: Payer: Self-pay | Admitting: Family Medicine

## 2012-02-20 NOTE — Telephone Encounter (Signed)
RX sent to pharmacy  

## 2012-02-20 NOTE — Telephone Encounter (Signed)
MS. Knouff WANTED DR. Darrold Span TO BE AWARE THAT SHE HAS A GANGLION CYST IN HER RIGHT HAND FOR ABOUT THE LAST 10 DAYS HAND SWOLLEN BLUE TO RED DISCOLORATION.  SHE IS SEEING DR. Milinda Cave AT [facility] FOR IT. SHE WANTED DR. Darrold Span TO KNOW BECAUSE THAT IS THE SIDE SHE HAD THE MASTECTOMY.

## 2012-02-25 ENCOUNTER — Institutional Professional Consult (permissible substitution): Payer: Medicare Other | Admitting: Internal Medicine

## 2012-02-26 ENCOUNTER — Ambulatory Visit: Payer: Self-pay | Admitting: Oncology

## 2012-03-04 ENCOUNTER — Telehealth: Payer: Self-pay

## 2012-03-04 NOTE — Telephone Encounter (Signed)
Rec'd call from pt stating that she wanted to ask a question about her arimidex, and how long she has been on it.  Called pt back and left message on unidentified vm to call office back.

## 2012-03-05 ENCOUNTER — Ambulatory Visit: Payer: Medicare HMO | Admitting: Oncology

## 2012-03-05 ENCOUNTER — Other Ambulatory Visit: Payer: Medicare HMO | Admitting: Lab

## 2012-03-05 NOTE — Telephone Encounter (Signed)
Close  

## 2012-03-09 ENCOUNTER — Other Ambulatory Visit (HOSPITAL_COMMUNITY): Payer: Self-pay | Admitting: Internal Medicine

## 2012-03-12 ENCOUNTER — Telehealth: Payer: Self-pay

## 2012-03-12 NOTE — Telephone Encounter (Signed)
SPOKE WITH MS. Acrey AND TOLD HER THAT SHE BEGAN THE ARIMIDEX July OF 2011 PER DR. Precious Reel OFFICE NOTE 01-02-2012. PT. APPRECIATED THE INFORMATION.

## 2012-03-19 ENCOUNTER — Ambulatory Visit: Payer: Medicare Other | Admitting: Family Medicine

## 2012-03-19 ENCOUNTER — Encounter: Payer: Self-pay | Admitting: Family Medicine

## 2012-03-19 ENCOUNTER — Ambulatory Visit (INDEPENDENT_AMBULATORY_CARE_PROVIDER_SITE_OTHER): Payer: Medicare Other | Admitting: Family Medicine

## 2012-03-19 DIAGNOSIS — M129 Arthropathy, unspecified: Secondary | ICD-10-CM

## 2012-03-19 DIAGNOSIS — M1990 Unspecified osteoarthritis, unspecified site: Secondary | ICD-10-CM

## 2012-03-19 DIAGNOSIS — R32 Unspecified urinary incontinence: Secondary | ICD-10-CM

## 2012-03-19 NOTE — Progress Notes (Signed)
Patient ID: Anne [name], female   DOB: 1941-08-29, 71 y.o.   MRN: [id number] Anne [name] [id number] 02/25/1941 03/19/2012      Progress Note-Follow Up  Subjective  Chief Complaint  Chief Complaint  Patient presents with  . urine and stool comes out at same time    X 1 year- stool and urine is not mixed    HPI  Patient is a 71 year old Caucasian female who is in today concerned about increasing urinary incontinence. She reports essentially only happens when she strains to have a bowel movement but she feels she has no control. She has a sensation of fullness sometimes when she wipes after urinating as well. It is more notable on the left than the right. She denies any difficulty with dribbling or stress incontinence while sneezing etc. She denies any dysuria, Hematuria, abdominal pain, fevers, chills malaise or myalgias. She is interested in a referral for further evaluation to consider her options of treatment.  Past Medical History  Diagnosis Date  . Anemia   . COPD (chronic obstructive pulmonary disease)   . Hyperlipidemia   . Pneumonia 2-10    pleurisy  . Arthritis   . Depression   . GERD (gastroesophageal reflux disease) 09/29/2009    improved s/p cholecystectomy and esophagus dilatation  . History of chicken pox   . History of shingles     2 episodes  . History of measles   . Osteopenia 03/14/2011  . 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 Pure hypercholesterolemia 10/17/2010  . 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 ESOPHAGEAL STRICTURE 03/29/2009  . CAROTID ARTERY STENOSIS 01/13/2010  . ALKALINE PHOSPHATASE, ELEVATED 03/15/2009  . Anxiety and depression 04/28/2011  . Vaginitis 05/22/2011  . Baker's cyst of knee 05/22/2011  . Trauma 10/18/2011  . Radial neck fracture 10/2011    minimally displaced  . Atypical chest pain 11/30/2011  . Folliculitis of nose 12/31/2011  . Esophageal ring   . 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  Urinary incontinence 03/19/2012    Past Surgical History  Procedure Date  . Anterior cruciate ligament repair 08, 09, 10  . Ercp, cbd stone extraction 2010  . Cholecystectomy 2010  . Egd/ dili 2010, 2012  . Breast surgery 2001    lumpectomy (01)  . Colonoscopy 09/05/10    cecal avm's  . Esophagogastroduodenoscopy 01/08/2012    Procedure: ESOPHAGOGASTRODUODENOSCOPY (EGD);  Surgeon: [name], MD;  Location: [facility];  Service: Endoscopy;  Laterality: N/A;  . Savory dilation 01/08/2012    Procedure: SAVORY DILATION;  Surgeon: [name], MD;  Location: [facility];  Service: Endoscopy;  Laterality: N/A;  need xray  . Mastectomy modified radical     , Mastectomy modified radical (08), breast reconstruction, CA lesions excised lateral abd wall 2010  . Breast reconstructive surgery 2008, 2009, 2010    Family History  Problem Relation Age of Onset  . Heart disease Father   . Lung cancer Father     smoker  . Cirrhosis Sister     Primary Biliary  . Breast cancer    . Stroke Maternal Grandmother   . Alcohol abuse Maternal Grandfather   . Heart disease Paternal Grandfather   . Anxiety disorder Sister   . Osteoporosis Sister   . Skin cancer Sister   . Osteoporosis Sister   . Other Mother     tic deleauroux  . Anesthesia problems  Neg Hx   . Hypotension Neg Hx   . Malignant hyperthermia Neg Hx   . Pseudochol deficiency Neg Hx   . Ovarian cancer    . Pancreatic cancer    . Colon cancer Neg Hx     History   Social History  . Marital Status: Married    Spouse Name: N/A    Number of Children: N/A  . Years of Education: N/A   Occupational History  . Not on file.   Social History Main Topics  . Smoking status: Former Smoker    Types: Cigarettes    Quit date: 05/22/2007  . Smokeless tobacco: Never Used  . Alcohol Use: 0.6 oz/week    1 Glasses of wine per week     6 oz daily  . Drug Use: No  . Sexually Active: Yes -- Female partner(s)   Other Topics Concern    . Not on file   Social History Narrative  . No narrative on file    Current Outpatient Prescriptions on File Prior to Visit  Medication Sig Dispense Refill  . amitriptyline (ELAVIL) 150 MG tablet Take 1 tablet (150 mg total) by mouth at bedtime.  90 tablet  3  . anastrozole (ARIMIDEX) 1 MG tablet Take 1 tablet (1 mg total) by mouth daily.  90 tablet  1  . aspirin 81 MG tablet Take 81 mg by mouth daily.        [name] atorvastatin (LIPITOR) 40 MG tablet take 1 tablet by mouth at bedtime  30 tablet  6  . beta carotene 16109 UNIT capsule Take 25,000 Units by mouth daily.        . calcium carbonate (TUMS - DOSED IN MG ELEMENTAL CALCIUM) 500 MG chewable tablet Chew 1 tablet by mouth 4 (four) times daily.      . Calcium Citrate (CITRACAL PO) Take 500 mg by mouth daily.        . Cholecalciferol (VITAMIN D3) 1000 UNITS CAPS Take 2 tablets by mouth daily.        [name] FERRETTS 325 (106 FE) MG TABS take 1 tablet by mouth once daily  30 tablet  4  . folic acid (FOLVITE) 1 MG tablet Take 1 tablet (1 mg total) by mouth daily.  30 tablet  11  . LORazepam (ATIVAN) 1 MG tablet 1 tablet po twice a day if needed  40 tablet  2  . NON FORMULARY ZINC OXIDE 50 MG DAILY       . pantoprazole (PROTONIX) 40 MG tablet take 1 tablet by mouth once daily 30 MINUTES BEFORE BREAKFAST  30 tablet  3  . pyridOXINE (VITAMIN B-6) 50 MG tablet Take 50 mg by mouth 3 (three) times daily.        . sertraline (ZOLOFT) 50 MG tablet Take 25 mg by mouth as directed.       . thiamine 100 MG tablet Take 1 tablet (100 mg total) by mouth daily.  30 tablet  11  . vitamin B-12 (CYANOCOBALAMIN) 1000 MCG tablet Take 1,000 mcg by mouth daily.          Allergies  Allergen Reactions  . Codeine   . Diphenhydramine Hcl   . Morphine   . Zofran Other (See Comments)    HEADACHE    Review of Systems  Review of Systems  Constitutional: Negative for fever and malaise/fatigue.  HENT: Negative for congestion.   Eyes: Negative for discharge.   Respiratory: Negative for shortness of breath.  Cardiovascular: Negative for chest pain, palpitations and leg swelling.  Gastrointestinal: Negative for nausea, abdominal pain and diarrhea.  Genitourinary: Negative for dysuria, urgency, frequency, hematuria and flank pain.  Musculoskeletal: Negative for falls.  Skin: Negative for rash.  Neurological: Negative for loss of consciousness and headaches.  Endo/Heme/Allergies: Negative for polydipsia.  Psychiatric/Behavioral: Negative for depression and suicidal ideas. The patient is not nervous/anxious and does not have insomnia.     Objective  BP 107/73  Pulse 91  Temp(Src) 98.6 F (37 C) (Temporal)  Ht 5\' 6"  (1.676 m)  Wt 129 lb 12.8 oz (58.877 kg)  BMI 20.95 kg/m2  SpO2 95%  Physical Exam  Physical Exam  Constitutional: She is oriented to person, place, and time and well-developed, well-nourished, and in no distress. No distress.  HENT:  Head: Normocephalic and atraumatic.  Eyes: Conjunctivae are normal.  Neck: Neck supple. No thyromegaly present.  Cardiovascular: Normal rate, regular rhythm and normal heart sounds.   No murmur heard. Pulmonary/Chest: Effort normal and breath sounds normal. She has no wheezes.  Abdominal: She exhibits no distension and no mass.  Musculoskeletal: She exhibits no edema.  Lymphadenopathy:    She has no cervical adenopathy.  Neurological: She is alert and oriented to person, place, and time.  Skin: Skin is warm and dry. No rash noted. She is not diaphoretic.  Psychiatric: Memory, affect and judgment normal.    Lab Results  Component Value Date   TSH 3.51 12/21/2011   Lab Results  Component Value Date   WBC 5.4 01/02/2012   HGB 11.8 01/02/2012   HCT 34.6* 01/02/2012   MCV 93.8 01/02/2012   PLT 203 01/02/2012   Lab Results  Component Value Date   CREATININE 0.77 01/02/2012   BUN 17 01/02/2012   NA 142 01/02/2012   K 4.4 01/02/2012   CL 103 01/02/2012   CO2 28 01/02/2012   Lab Results   Component Value Date   ALT 48* 01/02/2012   AST 35 01/02/2012   ALKPHOS 111 01/02/2012   BILITOT 0.4 01/02/2012   Lab Results  Component Value Date   CHOL 188 12/21/2011   Lab Results  Component Value Date   HDL 79.80 12/21/2011   Lab Results  Component Value Date   LDLCALC 85 12/21/2011   Lab Results  Component Value Date   TRIG 116.0 12/21/2011   Lab Results  Component Value Date   CHOLHDL 2 12/21/2011     Assessment & Plan  Urinary incontinence Mostly occurs when she strains to move her bowels. She does note some sense of fullness vaginally when she wipes as well. She would like an evaluation to determine if anything can be done to manage it. Sent to urology for further evaluation

## 2012-03-19 NOTE — Assessment & Plan Note (Signed)
Mostly occurs when she strains to move her bowels. She does note some sense of fullness vaginally when she wipes as well. She would like an evaluation to determine if anything can be done to manage it. Sent to urology for further evaluation

## 2012-03-19 NOTE — Patient Instructions (Signed)
Urinary Incontinence Your doctor wants you to have this information about urinary incontinence. This is the inability to keep urine in your body until you decide to release it. CAUSES  Prostate gland enlargement is a common cause of urinary incontinence. But there are many different causes for losing urinary control. They include:  Medicines.   Infections.   Prostate problems.   Surgery.   Neurological diseases.   Emotional factors.  DIAGNOSIS  Evaluating the cause of incontinence is important in choosing the best treatment. This may require:  An ultrasound exam.   Kidney and bladder X-rays.   Cystoscopy. This is an exam of the bladder using a narrow scope.  TREATMENT  For incontinent patients, normal daily hygiene and using changing pads or adult diapers regularly will prevent offensive odors and skin damage from the moisture. Changing your medicines may help control incontinence. Your caregiver may prescribe some medicines to help you regain control. Avoid caffeine. It can over-stimulate the bladder. Use the bathroom regularly. Try about every 2 to 3 hours even if you do not feel the need. Take time to empty your bladder completely. After urinating, wait a minute. Then try to urinate again. External devices used to catch urine or an indwelling urine catheter (Foley catheter) may be needed as well. Some prostate gland problems require surgery to correct. Call your caregiver for more information. Document Released: 12/06/2004 Document Revised: 10/18/2011 Document Reviewed: 12/01/2008 Exit[employer] Patient Information 2012 [employer]. 

## 2012-03-26 ENCOUNTER — Other Ambulatory Visit: Payer: Self-pay | Admitting: Family Medicine

## 2012-03-26 ENCOUNTER — Other Ambulatory Visit (INDEPENDENT_AMBULATORY_CARE_PROVIDER_SITE_OTHER): Payer: Medicare Other

## 2012-03-26 ENCOUNTER — Telehealth: Payer: Self-pay | Admitting: Family Medicine

## 2012-03-26 DIAGNOSIS — M791 Myalgia, unspecified site: Secondary | ICD-10-CM

## 2012-03-26 DIAGNOSIS — M1990 Unspecified osteoarthritis, unspecified site: Secondary | ICD-10-CM

## 2012-03-26 DIAGNOSIS — M129 Arthropathy, unspecified: Secondary | ICD-10-CM

## 2012-03-26 DIAGNOSIS — IMO0001 Reserved for inherently not codable concepts without codable children: Secondary | ICD-10-CM

## 2012-03-26 LAB — C-REACTIVE PROTEIN: CRP: 1 mg/dL (ref 1–20)

## 2012-03-26 LAB — CBC
MCHC: 33.6 g/dL (ref 30.0–36.0)
Platelets: 218 10*3/uL (ref 150.0–400.0)
RDW: 13.2 % (ref 11.5–14.6)

## 2012-03-26 NOTE — Telephone Encounter (Signed)
Sed rate and RF already ordered, will add a crp

## 2012-03-26 NOTE — Telephone Encounter (Signed)
Patient would like to come in for her labs today but she wants to add additional tests, please contact patient

## 2012-03-26 NOTE — Telephone Encounter (Signed)
Pt wants to see if theres anything to test because she feels terrible for last 5 days and is achy down arms and legs? If so, can you just add it to her orders. She is coming in this afternoon to get blood drawn.

## 2012-03-27 LAB — RHEUMATOID FACTOR: Rhuematoid fact SerPl-aCnc: <10 IU/mL (ref <=14)

## 2012-03-27 LAB — ANA: Anti Nuclear Antibody(ANA): NEGATIVE

## 2012-03-28 ENCOUNTER — Telehealth: Payer: Self-pay

## 2012-03-28 NOTE — Telephone Encounter (Signed)
Patient left a message wanting to know her lab results. Can you please advise?

## 2012-03-30 NOTE — Telephone Encounter (Signed)
Please note Dr. Mariel Aloe comments regarding these results 03/27/12.--thx

## 2012-03-31 NOTE — Telephone Encounter (Signed)
Pt informed

## 2012-03-31 NOTE — Telephone Encounter (Signed)
Entered by Bradd Canary, MD at 03/27/2012 1:30 PM:.  Labs look good for the most part. Anemia is still present and mild. Sed rate still up but better and very slight. Women tend to run slightly hi. Can be a marker of having recently had a viral illness. Everything else normal.  I left a message for patient to return my call.

## 2012-04-21 ENCOUNTER — Other Ambulatory Visit: Payer: Self-pay

## 2012-04-24 ENCOUNTER — Other Ambulatory Visit: Payer: Self-pay

## 2012-04-24 DIAGNOSIS — G4700 Insomnia, unspecified: Secondary | ICD-10-CM

## 2012-04-24 DIAGNOSIS — F329 Major depressive disorder, single episode, unspecified: Secondary | ICD-10-CM

## 2012-04-24 MED ORDER — AMITRIPTYLINE HCL 150 MG PO TABS: 150.0000 mg | ORAL_TABLET | Freq: Every day | ORAL | Status: DC

## 2012-04-29 ENCOUNTER — Encounter: Payer: Self-pay | Admitting: Oncology

## 2012-04-29 ENCOUNTER — Other Ambulatory Visit (HOSPITAL_BASED_OUTPATIENT_CLINIC_OR_DEPARTMENT_OTHER): Payer: Medicare Other | Admitting: Lab

## 2012-04-29 ENCOUNTER — Ambulatory Visit (HOSPITAL_BASED_OUTPATIENT_CLINIC_OR_DEPARTMENT_OTHER): Payer: Medicare Other | Admitting: Oncology

## 2012-04-29 ENCOUNTER — Telehealth: Payer: Self-pay | Admitting: Oncology

## 2012-04-29 VITALS — BP 118/75 | HR 88 | Temp 97.3°F | Ht 66.0 in | Wt 128.2 lb

## 2012-04-29 DIAGNOSIS — M899 Disorder of bone, unspecified: Secondary | ICD-10-CM

## 2012-04-29 DIAGNOSIS — C50919 Malignant neoplasm of unspecified site of unspecified female breast: Secondary | ICD-10-CM

## 2012-04-29 DIAGNOSIS — Z8501 Personal history of malignant neoplasm of esophagus: Secondary | ICD-10-CM

## 2012-04-29 DIAGNOSIS — F1020 Alcohol dependence, uncomplicated: Secondary | ICD-10-CM

## 2012-04-29 LAB — CBC WITH DIFFERENTIAL/PLATELET
Basophils Absolute: 0 10*3/uL (ref 0.0–0.1)
Eosinophils Absolute: 0.1 10*3/uL (ref 0.0–0.5)
HCT: 34.9 % (ref 34.8–46.6)
HGB: 11.7 g/dL (ref 11.6–15.9)
MCH: 31.3 pg (ref 25.1–34.0)
MONO#: 0.3 10*3/uL (ref 0.1–0.9)
NEUT%: 61.4 % (ref 38.4–76.8)
WBC: 4.2 10*3/uL (ref 3.9–10.3)
lymph#: 1.1 10*3/uL (ref 0.9–3.3)

## 2012-04-29 LAB — COMPREHENSIVE METABOLIC PANEL
BUN: 18 mg/dL (ref 6–23)
CO2: 30 mEq/L (ref 19–32)
Calcium: 9.4 mg/dL (ref 8.4–10.5)
Chloride: 104 mEq/L (ref 96–112)
Creatinine, Ser: 0.72 mg/dL (ref 0.50–1.10)
Glucose, Bld: 76 mg/dL (ref 70–99)

## 2012-04-29 NOTE — Patient Instructions (Signed)
Appointments as scheduled. 

## 2012-04-29 NOTE — Telephone Encounter (Signed)
gve the pt her July 2013 appt along with the pet scan
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
## 2012-05-13 ENCOUNTER — Telehealth: Payer: Self-pay | Admitting: *Deleted

## 2012-05-13 NOTE — Telephone Encounter (Signed)
Called patient and left message that Dr.Ross reviewed her lipid panel from 12/21/2011 and advised that profile looked good and to stay on the same regimen.

## 2012-05-14 ENCOUNTER — Encounter (HOSPITAL_COMMUNITY)
Admission: RE | Admit: 2012-05-14 | Discharge: 2012-05-14 | Disposition: A | Discharge status: Home / Self Care | Payer: Medicare Other | Source: Ambulatory Visit | Attending: Oncology | Admitting: Oncology

## 2012-05-14 ENCOUNTER — Encounter (HOSPITAL_COMMUNITY): Payer: Self-pay

## 2012-05-14 DIAGNOSIS — C50919 Malignant neoplasm of unspecified site of unspecified female breast: Secondary | ICD-10-CM

## 2012-05-14 DIAGNOSIS — C792 Secondary malignant neoplasm of skin: Secondary | ICD-10-CM | POA: Insufficient documentation

## 2012-05-14 LAB — GLUCOSE, CAPILLARY: Glucose-Capillary: 93 mg/dL (ref 70–99)

## 2012-05-14 MED ORDER — FLUDEOXYGLUCOSE F - 18 (FDG) INJECTION
1147.0000 | Freq: Once | INTRAVENOUS | Status: AC | PRN
  Administered 2012-05-14: 17.9 via INTRAVENOUS

## 2012-05-16 ENCOUNTER — Telehealth: Payer: Self-pay

## 2012-05-16 NOTE — Telephone Encounter (Signed)
TOLD MS. COK THAT THE PET SCAN SHOWED NOTHING OF CONCERN FOR ACTIVE CANCER-GOOD!  kEEP APPT. AS SCHEDULED WITH DR. Darrold Span ON 05-28-12.

## 2012-05-19 ENCOUNTER — Other Ambulatory Visit: Payer: Self-pay | Admitting: Family Medicine

## 2012-05-21 NOTE — Telephone Encounter (Signed)
eScribe request for refill on LORAZEPAM Last seen on 03/19/12 Follow up was needed in one month.  Pt does not have appt scheduled. Please advise refills.

## 2012-05-21 NOTE — Telephone Encounter (Signed)
OK to give refill of Lorazepam with same sig, same number 40 with 1 rf, needs to be seen before more is given

## 2012-05-22 NOTE — Telephone Encounter (Signed)
Pt notified.  She will call back to schedule.

## 2012-05-22 NOTE — Telephone Encounter (Signed)
RX printed.  Message left for patient to return call at earliest convenience.

## 2012-05-28 ENCOUNTER — Encounter: Payer: Self-pay | Admitting: Oncology

## 2012-05-28 ENCOUNTER — Ambulatory Visit (HOSPITAL_BASED_OUTPATIENT_CLINIC_OR_DEPARTMENT_OTHER): Payer: Medicare Other | Admitting: Oncology

## 2012-05-28 VITALS — BP 107/75 | HR 91 | Temp 97.7°F | Ht 66.0 in | Wt 126.7 lb

## 2012-05-28 DIAGNOSIS — Z853 Personal history of malignant neoplasm of breast: Secondary | ICD-10-CM

## 2012-05-28 NOTE — Patient Instructions (Signed)
Increase weight bearing exercise.

## 2012-05-28 NOTE — Progress Notes (Signed)
OFFICE PROGRESS NOTE   05/28/2012   Physicians:S.Alena Bills (urology), C.Gessner, P.Ross, G.Truesdale   INTERVAL HISTORY:   Patient is seen, together with husband, in follow up of PET done 05-14-12 because of complaints of unusual fatigue and diffuse bone and joint pain at visit 04-29-12, this is setting of two primary right breast cancers and a chest wall recurrence. Fortunately the PET does not show any findings that show active metastatic disease. She does continue Arimidex.  History is of 2 primary lobular left breast carcinomas. Her initial diagnosis was March 2001, treated systemically with CMF followed by 5 years of tamoxifen. Second diagnosis was Jan. 2008 (tho patient did not agree to surgery until July 2008) and subsequently had no adjuvant systemic treatment at her decision. She had skin recurrence lateral right chest wall excised in Sept 2010 then treated with xeloda from Oct 2010 thru Feb 2011 and has been on Arimidex since July 2011, which she is tolerating well. She has had no documented active disease since this most recent treatment. Bone density scan was done at [facility] 09-14-11, with osteopenia not significantly changed from 07-2010. Last mammogram (left) was 12-25-11. Last body CTs were 03-2010; PET 05-14-12 as above.  Patient seems to be feeling better now. She is not complaining of fatigue, wants to resume walking when weather allows. She tells me that the AM stiffness and joint symptoms are only intermittent, improve quickly with activity and that she had not needed NSAID in at least 5 days. She denies new or different pain, respiratory or GI symptoms, any fever.  Remainder of 10 point Review of Systems negative.  Objective:  Vital signs in last 24 hours:  BP 107/75  Pulse 91  Temp 97.7 F (36.5 C) (Oral)  Ht 5\' 6"  (1.676 m)  Wt 126 lb 11.2 oz (57.471 kg)  BMI 20.45 kg/m2 Weight is down 1 lb. Easily mobile, respirations not labored RA.   HEENT:PERRLA, sclera clear,  anicteric and oropharynx clear, no lesions LymphaticsCervical, supraclavicular, and axillary nodes normal. Resp: clear to auscultation bilaterally and normal percussion bilaterally Cardio: regular rate and rhythm GI: soft, non-tender; bowel sounds normal; no masses,  no organomegaly Extremities: extremities normal, atraumatic, no cyanosis or edema Neuro:grossly nonfocal Breasts: reconstructed right breast not tight, no skin changes of concern, no mass. Right axilla not remarkable, no swelling RUE. Left breast with well healed incisions, no dominant masses, no skin or nipple findings and left axilla benign Skin without rash or other lesions.  Lab Results:  Results for orders placed during the hospital encounter of 05/14/12  GLUCOSE, CAPILLARY      Component Value Range   Glucose-Capillary 93  70 - 99 mg/dL     Studies/Results:  PET 05-14-12 NUCLEAR MEDICINE PET SKULL BASE TO THIGH  Fasting Blood Glucose: 93  Technique: 17.9 mCi F-18 FDG was injected intravenously. CT data  was obtained and used for attenuation correction and anatomic  localization only. (This was not acquired as a diagnostic CT  examination.) Additional exam technical data entered on  technologist worksheet.  Comparison: PET CT scan 08/11/2009  Findings:  Neck: There are small bilateral level II lymph nodes which are not  changed from prior and do not have significant metabolic activity.  Chest: There are small paratracheal lymph nodes which are less than  10 mm and have low metabolic active are likely reactive. Linear  fibrotic pattern in the right upper lobe which likely represents  radiation change.  Abdomen / Pelvis:No abnormal hypermetabolic activity  within the  solid organs. No evidence of abdominal or pelvic hypermetabolic  nodes.  Skeleton:No focal hypermetabolic activity to suggest skeletal  metastasis.  IMPRESSION:  No evidence of breast cancer recurrence.  Original Report Authenticated By: Genevive Bi, M.D.  Medications: I have reviewed the patient's current medications.  Assessment/Plan: 1. History of lobular right breast cancer as above: continuing arimidex since right chest wall recurrence. I will see her back in 6 months or sooner if needed, CBC/CMET with visit. 2. Osteopenia by last bone density scan at [facility]. She needs to resume walking regularly. As she is on chronic high risk medication arimidex, it is medically necessary to have this done again on yearly basis.  3. ETOH abuse 4. History anxiety and depression 5. Past tobacco  Patient asks what results were of genetic testing sent after most recent (?) breast cancer -- I believe this testing was somehow done without patient's consent (?) so was never charged. In time available during this visit, I am not able to locate that information, if it is even available. I will ask our genetics counselor if she can follow up. Patient requested and was given a copy of the PET report.  Reece Packer, MD   05/28/2012, 8:27 PM

## 2012-05-29 ENCOUNTER — Other Ambulatory Visit: Payer: Self-pay | Admitting: Oncology

## 2012-05-29 DIAGNOSIS — M8580 Other specified disorders of bone density and structure, unspecified site: Secondary | ICD-10-CM

## 2012-05-30 ENCOUNTER — Telehealth: Payer: Self-pay | Admitting: Oncology

## 2012-05-30 NOTE — Telephone Encounter (Signed)
S/w the pt and she is aware of her jan 2014 appt along with the bone density appt

## 2012-05-30 NOTE — Telephone Encounter (Signed)
S/w the pt and she is aware of her bone density appt in nov

## 2012-06-06 ENCOUNTER — Telehealth: Payer: Self-pay

## 2012-06-06 NOTE — Telephone Encounter (Signed)
Left message for Anne [name] asking if the "genetic testing " sent on recent breast cancer was the tests done for prognostic indicators on tissue sample?   Stated that she only needed page one of PET scan repot.  The other pages were not related to report. Suggested that she discuss the impact of her deviated septum on her emphysema with her pcp.

## 2012-06-10 ENCOUNTER — Ambulatory Visit (INDEPENDENT_AMBULATORY_CARE_PROVIDER_SITE_OTHER): Payer: Medicare Other | Admitting: Family Medicine

## 2012-06-10 ENCOUNTER — Encounter: Payer: Self-pay | Admitting: Family Medicine

## 2012-06-10 VITALS — BP 110/75 | HR 97 | Temp 97.3°F | Ht 66.0 in | Wt 127.8 lb

## 2012-06-10 DIAGNOSIS — K219 Gastro-esophageal reflux disease without esophagitis: Secondary | ICD-10-CM

## 2012-06-10 DIAGNOSIS — R32 Unspecified urinary incontinence: Secondary | ICD-10-CM

## 2012-06-10 DIAGNOSIS — F329 Major depressive disorder, single episode, unspecified: Secondary | ICD-10-CM

## 2012-06-10 DIAGNOSIS — R1310 Dysphagia, unspecified: Secondary | ICD-10-CM

## 2012-06-10 DIAGNOSIS — F419 Anxiety disorder, unspecified: Secondary | ICD-10-CM

## 2012-06-10 DIAGNOSIS — F411 Generalized anxiety disorder: Secondary | ICD-10-CM

## 2012-06-10 DIAGNOSIS — T7840XA Allergy, unspecified, initial encounter: Secondary | ICD-10-CM

## 2012-06-10 DIAGNOSIS — R319 Hematuria, unspecified: Secondary | ICD-10-CM

## 2012-06-10 DIAGNOSIS — F341 Dysthymic disorder: Secondary | ICD-10-CM

## 2012-06-10 DIAGNOSIS — D649 Anemia, unspecified: Secondary | ICD-10-CM

## 2012-06-10 HISTORY — DX: Allergy, unspecified, initial encounter: T78.40XA

## 2012-06-10 MED ORDER — LORAZEPAM 1 MG PO TABS: 1.0000 mg | ORAL_TABLET | Freq: Three times a day (TID) | ORAL | Status: DC | PRN

## 2012-06-10 NOTE — Assessment & Plan Note (Signed)
Mild continue vitamin b and iron supplements

## 2012-06-10 NOTE — Assessment & Plan Note (Signed)
Patient only notes lack of control during a BM, denies any other episodes

## 2012-06-10 NOTE — Progress Notes (Signed)
Patient ID: Anne [name], female   DOB: 1941-01-05, 71 y.o.   MRN: [id number] Anne [name] [id number] November 16, 1940 06/10/2012      Progress Note-Follow Up  Subjective  Chief Complaint  Chief Complaint  Patient presents with  . Follow-up    med refill    HPI  Patient is 71 year old Caucasian female in today for followup. She's had a recent trouble with increasing dysphasia. He has had a dilatation to the past. At present she notes meat as well as rice and bread sticks are worse. She denies true burning or heartburn in that sense. Otherwise reports she's doing well. Although she is asking for refill on her lorazepam. She denies difficulty with her depression recently. No chest pain, palpitations, shortness of breath, GI or GU complaints  Past Medical History  Diagnosis Date  . Anemia   . COPD (chronic obstructive pulmonary disease)   . Hyperlipidemia   . Pneumonia 2-10    pleurisy  . Arthritis   . Depression   . GERD (gastroesophageal reflux disease) 09/29/2009    improved s/p cholecystectomy and esophagus dilatation  . History of chicken pox   . History of shingles     2 episodes  . History of measles   . Osteopenia 03/14/2011  . 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 Pure hypercholesterolemia 10/17/2010  . 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 ESOPHAGEAL STRICTURE 03/29/2009  . CAROTID ARTERY STENOSIS 01/13/2010  . ALKALINE PHOSPHATASE, ELEVATED 03/15/2009  . Anxiety and depression 04/28/2011  . Vaginitis 05/22/2011  . Baker's cyst of knee 05/22/2011  . Trauma 10/18/2011  . Radial neck fracture 10/2011    minimally displaced  . Atypical chest pain 11/30/2011  . Folliculitis of nose 12/31/2011  . Esophageal ring   . 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 Urinary incontinence 03/19/2012  . Allergic state 06/10/2012    Past Surgical History  Procedure Date  . Anterior cruciate ligament repair 08, 09, 10  . Ercp, cbd stone extraction  2010  . Cholecystectomy 2010  . Egd/ dili 2010, 2012  . Breast surgery 2001    lumpectomy (01)  . Colonoscopy 09/05/10    cecal avm's  . Esophagogastroduodenoscopy 01/08/2012    Procedure: ESOPHAGOGASTRODUODENOSCOPY (EGD);  Surgeon: [name], MD;  Location: [facility];  Service: Endoscopy;  Laterality: N/A;  . Savory dilation 01/08/2012    Procedure: SAVORY DILATION;  Surgeon: [name], MD;  Location: [facility];  Service: Endoscopy;  Laterality: N/A;  need xray  . Mastectomy modified radical     , Mastectomy modified radical (08), breast reconstruction, CA lesions excised lateral abd wall 2010  . Breast reconstructive surgery 2008, 2009, 2010    Family History  Problem Relation Age of Onset  . Heart disease Father   . Lung cancer Father     smoker  . Cirrhosis Sister     Primary Biliary  . Breast cancer    . Stroke Maternal Grandmother   . Alcohol abuse Maternal Grandfather   . Heart disease Paternal Grandfather   . Anxiety disorder Sister   . Osteoporosis Sister   . Skin cancer Sister   . Osteoporosis Sister   . Other Mother     tic deleauroux  . Anesthesia problems Neg Hx   . Hypotension Neg Hx   . Malignant hyperthermia Neg Hx   . Pseudochol deficiency Neg Hx   . Ovarian cancer    .  Pancreatic cancer    . Colon cancer Neg Hx     History   Social History  . Marital Status: Married    Spouse Name: N/A    Number of Children: N/A  . Years of Education: N/A   Occupational History  . Not on file.   Social History Main Topics  . Smoking status: Former Smoker    Types: Cigarettes    Quit date: 05/22/2007  . Smokeless tobacco: Never Used  . Alcohol Use: 0.6 oz/week    1 Glasses of wine per week     6 oz daily  . Drug Use: No  . Sexually Active: Yes -- Female partner(s)   Other Topics Concern  . Not on file   Social History Narrative  . No narrative on file    Current Outpatient Prescriptions on File Prior to Visit  Medication Sig Dispense  Refill  . amitriptyline (ELAVIL) 150 MG tablet Take 1 tablet (150 mg total) by mouth at bedtime.  90 tablet  1  . anastrozole (ARIMIDEX) 1 MG tablet Take 1 tablet (1 mg total) by mouth daily.  90 tablet  1  . aspirin 81 MG tablet Take 81 mg by mouth daily.        [name] atorvastatin (LIPITOR) 40 MG tablet take 1 tablet by mouth at bedtime  30 tablet  6  . beta carotene 16109 UNIT capsule Take 25,000 Units by mouth daily.        . calcium carbonate (TUMS - DOSED IN MG ELEMENTAL CALCIUM) 500 MG chewable tablet Chew 1 tablet by mouth 4 (four) times daily.      . Cholecalciferol (VITAMIN D3) 1000 UNITS CAPS Take 2 tablets by mouth daily.        [name] FERRETTS 325 (106 FE) MG TABS take 1 tablet by mouth once daily  30 tablet  4  . pantoprazole (PROTONIX) 40 MG tablet take 1 tablet by mouth once daily 30 MINUTES BEFORE BREAKFAST  30 tablet  3  . polyethylene glycol (MIRALAX / GLYCOLAX) packet Take 17 g by mouth daily as needed.       . Probiotic Product (PROBIOTIC FORMULA PO) Take 1 capsule by mouth daily.      . vitamin B-12 (CYANOCOBALAMIN) 1000 MCG tablet Take 1,000 mcg by mouth daily.          Allergies  Allergen Reactions  . Codeine   . Diphenhydramine Hcl   . Morphine   . Peanut-Containing Drug Products   . Zofran Other (See Comments)    HEADACHE    Review of Systems  Review of Systems  Constitutional: Negative for fever and malaise/fatigue.  HENT: Negative for congestion.   Eyes: Negative for discharge.  Respiratory: Negative for shortness of breath.   Cardiovascular: Negative for chest pain, palpitations, orthopnea and leg swelling.  Gastrointestinal: Negative for nausea, abdominal pain and diarrhea.       Dysphagia  Genitourinary: Negative for dysuria.  Musculoskeletal: Negative for falls.  Skin: Negative for rash.  Neurological: Negative for loss of consciousness and headaches.  Endo/Heme/Allergies: Negative for polydipsia.  Psychiatric/Behavioral: Negative for depression and  suicidal ideas. The patient is not nervous/anxious and does not have insomnia.     Objective  BP 110/75  Pulse 97  Temp 97.3 F (36.3 C) (Temporal)  Ht 5\' 6"  (1.676 m)  Wt 127 lb 12.8 oz (57.97 kg)  BMI 20.63 kg/m2  SpO2 97%  Physical Exam  Physical Exam  Constitutional: She is oriented to  person, place, and time and well-developed, well-nourished, and in no distress. No distress.  HENT:  Head: Normocephalic and atraumatic.  Eyes: Conjunctivae are normal.  Neck: Neck supple. No thyromegaly present.  Cardiovascular: Normal rate, regular rhythm and normal heart sounds.   No murmur heard. Pulmonary/Chest: Effort normal and breath sounds normal. She has no wheezes.  Abdominal: She exhibits no distension and no mass.  Musculoskeletal: She exhibits no edema.  Lymphadenopathy:    She has no cervical adenopathy.  Neurological: She is alert and oriented to person, place, and time.  Skin: Skin is warm and dry. No rash noted. She is not diaphoretic.  Psychiatric: Memory, affect and judgment normal.    Lab Results  Component Value Date   TSH 3.51 12/21/2011   Lab Results  Component Value Date   WBC 4.2 04/29/2012   HGB 11.7 04/29/2012   HCT 34.9 04/29/2012   MCV 93.7 04/29/2012   PLT 232 04/29/2012   Lab Results  Component Value Date   CREATININE 0.72 04/29/2012   BUN 18 04/29/2012   NA 141 04/29/2012   K 3.6 04/29/2012   CL 104 04/29/2012   CO2 30 04/29/2012   Lab Results  Component Value Date   ALT 22 04/29/2012   AST 19 04/29/2012   ALKPHOS 104 04/29/2012   BILITOT 0.4 04/29/2012   Lab Results  Component Value Date   CHOL 188 12/21/2011   Lab Results  Component Value Date   HDL 79.80 12/21/2011   Lab Results  Component Value Date   LDLCALC 85 12/21/2011   Lab Results  Component Value Date   TRIG 116.0 12/21/2011   Lab Results  Component Value Date   CHOLHDL 2 12/21/2011     Assessment & Plan  Anxiety and depression Refilled her Lorazepam today  Hematuria Has been  evaluated by urology and under went cystoscopy. She denies any significant incontinence other than when she is having a BM  Urinary incontinence Patient only notes lack of control during a BM, denies any other episodes  GERD Is having more trouble with Dysphagia at this time, is referred back to Gastroenterology and continue Omeprazole  Anemia Mild continue vitamin b and iron supplements

## 2012-06-10 NOTE — Assessment & Plan Note (Signed)
Refilled her Lorazepam today

## 2012-06-10 NOTE — Assessment & Plan Note (Signed)
Is having more trouble with Dysphagia at this time, is referred back to Gastroenterology and continue Omeprazole

## 2012-06-10 NOTE — Patient Instructions (Signed)
Dysphagia  Swallowing problems (dysphagia) occur when solids and liquids seem to stick in your throat on the way down to your stomach, or the food takes longer to get to the stomach. Other symptoms (problems) include regurgitating (burping) up food, noises coming from the throat, chest discomfort with swallowing, and a feeling of fullness in the throat when swallowing. When blockage in the throat is complete it may be associated with drooling.  CAUSES  There are many causes of swallowing difficulties and the following is generalized information regarding a number of reasons for this problem. Problems with swallowing may occur because of problems with the muscles. The food cannot be propelled in the usual manner into the stomach. There may be ulcers, scar tissueor inflammation (soreness) in the esophagus (the food tube from the mouth to the stomach) which blocks food from passing normally into the stomach. Causes of inflammation include acid reflux from the stomach into the esophagus. Inflammation can also be caused by the herpes simplex virus, Candida (yeast), radiation (as with treatment of cancer), or inflammation from medications not taken with adequate fluids to wash them down into the stomach. There may be nerve problems so signals cannot be sent adequately telling the muscles of the esophagus to contract and move the food along. Achalasia is a rare disorder of the esophagus in which muscular contractions of the esophagus are uncoordinated. Globus hystericus is a relatively common problem in young females in which there is a sense of an obstruction or difficulty in swallowing, but in which no abnormalities can be found. This problem usually improves over time with reassurance and testing to rule out other causes.  DIAGNOSIS  A number of tests will help your caregiver know what is the cause of your swallowing problems. These tests may include a barium swallow in which x-rays are taken while you are drinking a  liquid that outlines the lining of the esophagus on x-ray. If the stomach and small bowel are also studied in this manner it is called an upper gastrointestinal exam (UGI). Endoscopy may be done in which your caregiver examines your throat, esophagus, stomach and small bowel with an instrument like a small flexible telescope. Motility studies which measure the effectiveness and coordination of the muscular contractions of the esophagus may also be done.  TREATMENT  The treatment of swallowing problems are many, varying from medications to surgical treatment. The treatment varies with the type of problem found. Your caregiver will discuss your results and treatment with you. If swallowing problems are severe the long term problems which may occur include: malnutrition, pneumonia (from food going into the breathing tubes called trachea and bronchi), and an increase in tumors (lumps) of the esophagus.  SEEK IMMEDIATE MEDICAL CARE IF:   Food or other object becomes lodged in your throat or esophagus and won't move.  Document Released: 10/26/2000 Document Revised: 10/18/2011 Document Reviewed: 06/16/2008  [employer] Patient Information 2012 [employer].

## 2012-06-10 NOTE — Assessment & Plan Note (Signed)
Has been evaluated by urology and under went cystoscopy. She denies any significant incontinence other than when she is having a BM

## 2012-06-11 ENCOUNTER — Encounter: Payer: Self-pay | Admitting: Family Medicine

## 2012-06-11 ENCOUNTER — Telehealth: Payer: Self-pay

## 2012-06-11 NOTE — Telephone Encounter (Signed)
Anne [name] saw diagnosis of breast cancer in the skin on the report sheets attached to her recent PET scan results.  She did not know she had skin cancer. Explained to her that the breast cancer was found to have come back to her right chest wall in the skin in 2010. She does not have skin caner.   Anne [name] concerned about some red dots on her legs. Repeated above explanation several times. Suggested that if she is concerned with those red spots on her legs that she can have her PCP look at the area.    Anne [name] went on to say that she accessed her chart on line and saw a lab test done in 2011 that said that there is a problem with the veins connecting to the artery.  She asked for an explanation. Told her that this information needs to be seen in proper context to be interpreted.  Offered to have her bring this report by to look at.  She will sent it by mail with attention to Dr. Precious Reel Nurse.

## 2012-06-13 ENCOUNTER — Telehealth: Payer: Self-pay | Admitting: Internal Medicine

## 2012-06-13 ENCOUNTER — Telehealth: Payer: Self-pay

## 2012-06-13 MED ORDER — ATORVASTATIN CALCIUM 40 MG PO TABS: 40.0000 mg | ORAL_TABLET | Freq: Every day | ORAL | Status: DC

## 2012-06-13 NOTE — Telephone Encounter (Signed)
Left a message for patient to return my call. 

## 2012-06-13 NOTE — Telephone Encounter (Signed)
Patient had colonoscopy on 09-05-10. Patient did have AVM per Dr Abner Greenspan this is not concerning and repeat in 10 yrs.

## 2012-06-13 NOTE — Telephone Encounter (Signed)
Pt calling re refill needed , lipitor 40mg  generic , mail order prime mail [phone]

## 2012-06-16 NOTE — Telephone Encounter (Signed)
Pt informed and I put a copy of the colonoscopy at the front desk for pt to pick up

## 2012-06-19 ENCOUNTER — Ambulatory Visit: Payer: Medicare Other

## 2012-06-19 ENCOUNTER — Telehealth: Payer: Self-pay | Admitting: Family Medicine

## 2012-06-19 ENCOUNTER — Other Ambulatory Visit: Payer: Self-pay | Admitting: Family Medicine

## 2012-06-19 DIAGNOSIS — H6120 Impacted cerumen, unspecified ear: Secondary | ICD-10-CM

## 2012-06-19 MED ORDER — NEOMYCIN-POLYMYXIN-HC 3.5-10000-1 OT SOLN: 3.0000 [drp] | Freq: Two times a day (BID) | OTIC | Status: AC

## 2012-06-19 MED ORDER — NEOMYCIN-POLYMYXIN-HC 3.5-10000-1 OT SOLN: 3.0000 [drp] | Freq: Two times a day (BID) | OTIC | Status: DC

## 2012-06-19 NOTE — Telephone Encounter (Signed)
Spoke with Dr. Abner Greenspan and she has advised the patient try drops before having her ears cleaned. Patient is aware and would like the drops called in to the [facility] on [address] [phone].

## 2012-06-19 NOTE — Telephone Encounter (Signed)
Sent ears drops to pharmacy

## 2012-06-20 ENCOUNTER — Telehealth: Payer: Self-pay | Admitting: Internal Medicine

## 2012-06-20 NOTE — Telephone Encounter (Signed)
Please return call to patient [phone], regarding new diagnosis from Dr. Leone Payor and medication

## 2012-06-20 NOTE — Telephone Encounter (Signed)
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  She wants to know if Dr Tenny Craw thinks she should stop her blood thinners (ASA 81mg ) because of this?

## 2012-06-24 NOTE — Telephone Encounter (Signed)
She has cholesterol plaquing in the carotid arteries.  Yes she should stay on Aspirin.  Will need periodic f/u of CBC

## 2012-06-26 NOTE — Telephone Encounter (Signed)
Called and left message on machine that she needs to stay on ASA 81mg  per day per Dr.Ross.

## 2012-07-15 ENCOUNTER — Ambulatory Visit (INDEPENDENT_AMBULATORY_CARE_PROVIDER_SITE_OTHER): Payer: Medicare Other | Admitting: Internal Medicine

## 2012-07-15 ENCOUNTER — Telehealth: Payer: Self-pay

## 2012-07-15 ENCOUNTER — Encounter: Payer: Self-pay | Admitting: Internal Medicine

## 2012-07-15 VITALS — BP 92/64 | HR 100 | Ht 65.5 in | Wt 129.0 lb

## 2012-07-15 DIAGNOSIS — R1314 Dysphagia, pharyngoesophageal phase: Secondary | ICD-10-CM

## 2012-07-15 DIAGNOSIS — K219 Gastro-esophageal reflux disease without esophagitis: Secondary | ICD-10-CM

## 2012-07-15 DIAGNOSIS — K200 Eosinophilic esophagitis: Secondary | ICD-10-CM

## 2012-07-15 DIAGNOSIS — Z91010 Allergy to peanuts: Secondary | ICD-10-CM | POA: Insufficient documentation

## 2012-07-15 DIAGNOSIS — R1310 Dysphagia, unspecified: Secondary | ICD-10-CM

## 2012-07-15 DIAGNOSIS — T781XXA Other adverse food reactions, not elsewhere classified, initial encounter: Secondary | ICD-10-CM

## 2012-07-15 DIAGNOSIS — F1010 Alcohol abuse, uncomplicated: Secondary | ICD-10-CM

## 2012-07-15 MED ORDER — PANTOPRAZOLE SODIUM 40 MG PO TBEC: 40.0000 mg | DELAYED_RELEASE_TABLET | Freq: Two times a day (BID) | ORAL | Status: DC

## 2012-07-15 NOTE — Progress Notes (Signed)
Subjective:    Patient ID: Anne [name], female    DOB: Dec 11, 1940, 71 y.o.   MRN: [id number]  HPI The patient presents with her husband today with complaints of some dysphagia. She has chronic intermittent dysphagia. She has eosinophilic esophagitis with multiple rings and strictures of the esophagus, last dilated to 40 [place] in February 2013. She says her dysphagia is not that bad other still an intermittent problem were things hang up liquids or solids. I think she is more concerned about her retrial of peanuts, which she had avoided for several years because she broke out in welts a rash. I do not know about this before. However she recently tried it again and had a rash, no swelling or respiratory compromise reported. In the spring I had recommended an allergy referral and she had not yet done that and is interested in pursuing that. She is not interested in pursuing repeat upper endoscopy and dilation.  She says she is faithful with her pantoprazole taking it daily. She could not handle ingested fluticasone in the past. This could not get that right and does not want to take it.  She continues to drink several glasses of wine daily, at least. She understands the potential problems associated with that.  Allergies  Allergen Reactions  . Codeine   . Diphenhydramine Hcl   . Morphine   . Peanut-Containing Drug Products   . Zofran Other (See Comments)    HEADACHE   Outpatient Prescriptions Prior to Visit  Medication Sig Dispense Refill  . amitriptyline (ELAVIL) 150 MG tablet Take 1 tablet (150 mg total) by mouth at bedtime.  90 tablet  1  . anastrozole (ARIMIDEX) 1 MG tablet Take 1 tablet (1 mg total) by mouth daily.  90 tablet  1  . aspirin 81 MG tablet Take 81 mg by mouth daily.        [name] atorvastatin (LIPITOR) 40 MG tablet Take 1 tablet (40 mg total) by mouth daily.  90 tablet  3  . beta carotene 40981 UNIT capsule Take 25,000 Units by mouth daily.        . calcium carbonate (TUMS  - DOSED IN MG ELEMENTAL CALCIUM) 500 MG chewable tablet Chew 1 tablet by mouth 4 (four) times daily.      . Cholecalciferol (VITAMIN D3) 1000 UNITS CAPS Take 2 tablets by mouth daily.        [name] FERRETTS 325 (106 FE) MG TABS take 1 tablet by mouth once daily  30 tablet  4  . LORazepam (ATIVAN) 1 MG tablet Take 1 tablet (1 mg total) by mouth every 8 (eight) hours as needed for anxiety.  60 tablet  2  . Probiotic Product (PROBIOTIC FORMULA PO) Take 1 capsule by mouth daily.      . vitamin B-12 (CYANOCOBALAMIN) 1000 MCG tablet Take 1,000 mcg by mouth daily.        . pantoprazole (PROTONIX) 40 MG tablet take 1 tablet by mouth once daily 30 MINUTES BEFORE BREAKFAST  30 tablet  3  . polyethylene glycol (MIRALAX / GLYCOLAX) packet Take 17 g by mouth daily as needed.        Past Medical History  Diagnosis Date  . Anemia   . COPD (chronic obstructive pulmonary disease)   . Hyperlipidemia   . Pneumonia 2-10    pleurisy  . Arthritis   . Depression   . GERD (gastroesophageal reflux disease) 09/29/2009    improved s/p cholecystectomy and esophagus dilatation  . History  of chicken pox   . History of shingles     2 episodes  . History of measles   . Osteopenia 03/14/2011  . 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 Pure hypercholesterolemia 10/17/2010  . 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 ESOPHAGEAL STRICTURE 03/29/2009  . CAROTID ARTERY STENOSIS 01/13/2010  . ALKALINE PHOSPHATASE, ELEVATED 03/15/2009  . Anxiety and depression 04/28/2011  . Vaginitis 05/22/2011  . Baker's cyst of knee 05/22/2011  . Trauma 10/18/2011  . Radial neck fracture 10/2011    minimally displaced  . Atypical chest pain 11/30/2011  . Folliculitis of nose 12/31/2011  . Esophageal ring   . 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 Urinary incontinence 03/19/2012  . Allergic state 06/10/2012  . Hematuria   . Dysphagia    Past Surgical History  Procedure Date  . Anterior cruciate ligament  repair 08, 09, 10  . Ercp, cbd stone extraction 2010  . Cholecystectomy 2010  . Egd/ dili 2010, 2012  . Breast surgery 2001    lumpectomy (01)  . Colonoscopy 09/05/10    cecal avm's  . Esophagogastroduodenoscopy 01/08/2012    Procedure: ESOPHAGOGASTRODUODENOSCOPY (EGD);  Surgeon: [name], MD;  Location: [facility];  Service: Endoscopy;  Laterality: N/A;  . Savory dilation 01/08/2012    Procedure: SAVORY DILATION;  Surgeon: [name], MD;  Location: [facility];  Service: Endoscopy;  Laterality: N/A;  need xray  . Mastectomy modified radical     , Mastectomy modified radical (08), breast reconstruction, CA lesions excised lateral abd wall 2010  . Breast reconstructive surgery 2008, 2009, 2010   History   Social History  . Marital Status: Married                 Social History Main Topics  . Smoking status: Former Smoker    Types: Cigarettes    Quit date: 05/22/2007  . Smokeless tobacco: Never Used  . Alcohol Use:  several glasses of wine daily            . Drug Use: No  . Sexually Active: Yes -- Female partner(s)     Family History  Problem Relation Age of Onset  . Heart disease Father   . Lung cancer Father     smoker  . Cirrhosis Sister     Primary Biliary  . Breast cancer    . Stroke Maternal Grandmother   . Alcohol abuse Maternal Grandfather   . Heart disease Paternal Grandfather   . Anxiety disorder Sister   . Osteoporosis Sister   . Skin cancer Sister   . Osteoporosis Sister   . Other Mother     tic deleauroux  . Anesthesia problems Neg Hx   . Hypotension Neg Hx   . Malignant hyperthermia Neg Hx   . Pseudochol deficiency Neg Hx   . Ovarian cancer    . Pancreatic cancer    . Colon cancer Neg Hx        Review of Systems Baker's cysts pain - using 2 ibuprofen a day    Objective:   Physical Exam General:  NAD Eyes:   anicteric Lungs:  clear Heart:  S1S2 no rubs, murmurs or gallops      Data:  Patient Name: JENAYAH, ANTU  Accession #: [id number] DOB: [birth year] Age: 6 Gender: F Client Name [facility] Collected Date: 05/11/2011 Received Date: 05/11/2011 Physician: Stan Head Chart #:  MRN # : [id number] Physician cc: Race:W Visit #: [id number] REPORT OF SURGICAL PATHOLOGY FINAL DIAGNOSIS Diagnosis Esophagus, biopsy - INCREASED EOSINOPHILS CONSISTENT WITH EOSINOPHILIC ESOPHAGITIS. Microscopic Comment There are fragments of benign esophageal squamous mucosa with markedly increased numbers of eosinophils, greater than 20 per high power field. The findings are consistent with the clinical impression of eosinophilic esophagitis. No fungi are identified with PAS stain. (JDP:kh 05-14-11) Jimmy Picket MD Pathologist, Electronic Signature (Case signed 05/14/2011) Assessment & Plan:   1. Eosinophilic esophagitis   2. Esophageal dysphagia   3. GERD (gastroesophageal reflux disease)   I recommended upper endoscopy with repeat dilation in the [place] [facility] with propofol sedation which sounds reasonable, she is not interested. Dysphagia 3 diet is prescribed. Allergy referral will be undertaken again, she says she was unable to follow through with this the last time Increase pantoprazole to 40 mg twice a day   4. Peanut allergy suspected   Allergy referral, avoid peanuts  5. Alcohol abuse   We again discussed the need to reduce and a limited alcohol.    CC: [name], MD Laurette Schimke, MD

## 2012-07-15 NOTE — Patient Instructions (Addendum)
We are referring you to Dr. Thad Ranger at [facility].. The appointment is 08/01/12 at 10:00am.  They are going to send you paperwork to fill out.  They are located at [address] [place] , [place] [address]. Phone # [phone].  Please read and follow the Dysphagia diet level 3 handout you have been given today.  We are changing your Pantoprazole to twice a day.  An Rx has been sent to your pharmacy.  Please continue to decrease/stop consuming alcohol.  Thank you for choosing me and [facility].  [name], M.D., [facility]

## 2012-07-15 NOTE — Telephone Encounter (Signed)
Told Anne [name] that Dr. Darrold Span has said that it is medically necessary to have bone density done yearly with her on high risk med-Arimidex.  She will call [facility] and Schedule test as she just received a reminder. Ms. Mellor also inquired about the 3D technology that [facility] is offering with mammograms.  She wanted to know if Dr. Darrold Span thought it would be beneficial in her situation.  It is $50.00 out of pocket as insurance does not cover the cost. She is due for mammograms in Feb. 2014.

## 2012-07-16 ENCOUNTER — Ambulatory Visit (INDEPENDENT_AMBULATORY_CARE_PROVIDER_SITE_OTHER): Payer: Medicare Other | Admitting: Family Medicine

## 2012-07-16 ENCOUNTER — Encounter: Payer: Self-pay | Admitting: Family Medicine

## 2012-07-16 ENCOUNTER — Other Ambulatory Visit (HOSPITAL_COMMUNITY): Payer: Self-pay | Admitting: Internal Medicine

## 2012-07-16 VITALS — BP 102/71 | HR 89 | Temp 97.8°F | Ht 66.0 in | Wt 129.8 lb

## 2012-07-16 DIAGNOSIS — R000 Tachycardia, unspecified: Secondary | ICD-10-CM

## 2012-07-16 DIAGNOSIS — M7120 Synovial cyst of popliteal space [Baker], unspecified knee: Secondary | ICD-10-CM

## 2012-07-16 MED ORDER — OXYCODONE-ACETAMINOPHEN 5-325 MG PO TABS
1.0000 | ORAL_TABLET | Freq: Two times a day (BID) | ORAL | Status: AC | PRN
Start: 2012-07-16 — End: 2012-07-26

## 2012-07-16 NOTE — Patient Instructions (Addendum)
Baker's Cyst  A Baker's cyst is a swelling that forms in the back of the knee. It is a sac-like structure. It is filled with the same fluid that is located in your knee. The fluid located in your knee is necessary because it lubricates the bones and cartilage. It allows them to move over each other more easily.  CAUSES   When the knee becomes injured or has soreness (inflammation) present, more fluid forms in the knee. When this happens, the joint lining is pushed out behind the knee and forms the baker's cyst. This cyst may also be caused by inflammation from arthritic conditions and infections.  DIAGNOSIS   A Baker's cyst is most often diagnosed with an ultrasound. This is a specialized picture (like an X-ray). It shows a picture by using sound waves. Sometimes a specialized x-ray called an MRI (magnetic resonance imaging) is used. This picks up other problems within a joint if an ultrasound alone cannot make the diagnosis. If the cyst came immediately following an injury, plain x-rays may be used to make a diagnosis.  TREATMENT   The treatment depends on the cause of the cyst. But most of these cysts are caused by an inflammation. Anti-inflammatory medications and rest often will get rid of the problem. If the cyst is caused by an infection, medications (antibiotics) will be prescribed to help this. Take the medications as directed. Refer to Home Care Instructions, below, for additional treatment suggestions.  HOME CARE INSTRUCTIONS    If the cyst was caused by an injury, for the first 24 hours, while lying down, keep the injured extremity elevated on 2 pillows.   For the first 24 hours while you are awake, apply ice bags (ice in a plastic bag with a towel around it to prevent frostbite to skin) 3 to 4 times per day for 15 to 20 minutes to the injured area. Then do as directed by your caregiver.   Only take over-the-counter or prescription medicines for pain, discomfort, or fever as directed by your  caregiver.  Persistent pain and inability to use the injured area for more than 2 to 3 days are warning signs indicating that you should see a caregiver for a follow-up visit as soon as possible. Persistent pain and swelling indicate that further evaluation, non-weight bearing (use of crutches as instructed), and/or further x-rays are needed. Make a follow-up appointment with your own caregiver.  If conservative measures (rest, medications and inactivity) do not help the problem get better, sometimes surgery for removal of the cyst is needed. Reasons for this may be that the cyst is pressing on nerves and/or vessels and causing problems which cannot wait for improvement with conservative treatment. If the problem is caused by injuries to the cartilage in the knee, surgery is often needed for treatment of that problem.  MAKE SURE YOU:    Understand these instructions.   Will watch your condition.   Will get help right away if you are not doing well or get worse.  Document Released: 10/29/2005 Document Revised: 10/18/2011 Document Reviewed: 06/16/2008  [employer] Patient Information 2012 [employer].

## 2012-07-16 NOTE — Progress Notes (Signed)
Patient ID: Anne [name], female   DOB: 01/02/1941, 71 y.o.   MRN: [id number] Office visit notes and procedures faxed to Dr. Lucie Leather for her upcoming 08/01/12 appointment.

## 2012-07-17 ENCOUNTER — Encounter: Payer: Self-pay | Admitting: Family Medicine

## 2012-07-20 NOTE — Assessment & Plan Note (Signed)
Numbers tolerable today

## 2012-07-20 NOTE — Assessment & Plan Note (Signed)
B/l knees, lately they have been bothering her more, she is referred to orthopaedics for further consideration

## 2012-07-20 NOTE — Progress Notes (Signed)
Patient ID: Anne [name], female   DOB: December 19, 1940, 71 y.o.   MRN: [id number] GIAVONNI CIZEK [id number] 1941-09-16 07/20/2012      Progress Note-Follow Up  Subjective  Chief Complaint  Chief Complaint  Patient presents with  . 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 She has Baker's cyst behind both knees and they tend to enlarge and then proceed. Recently they've been bothering her more. No trauma or injury. No calf pain or anterior swelling. No trauma. No other complaints. No chest pain, palpitations, shortness of breath  Past Medical History  Diagnosis Date  . Anemia   . COPD (chronic obstructive pulmonary disease)   . Hyperlipidemia   . Pneumonia 2-10    pleurisy  . Arthritis   . Depression   . GERD (gastroesophageal reflux disease) 09/29/2009    improved s/p cholecystectomy and esophagus dilatation  . History of chicken pox   . History of shingles     2 episodes  . History of measles   . Osteopenia 03/14/2011  . 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 Pure hypercholesterolemia 10/17/2010  . 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 ESOPHAGEAL STRICTURE 03/29/2009  . CAROTID ARTERY STENOSIS 01/13/2010  . ALKALINE PHOSPHATASE, ELEVATED 03/15/2009  . Anxiety and depression 04/28/2011  . Vaginitis 05/22/2011  . Baker's cyst of knee 05/22/2011  . Trauma 10/18/2011  . Radial neck fracture 10/2011    minimally displaced  . Atypical chest pain 11/30/2011  . Folliculitis of nose 12/31/2011  . Esophageal ring   . 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 Urinary incontinence 03/19/2012  . Allergic state 06/10/2012  . Hematuria   . Dysphagia     Past Surgical History  Procedure Date  . Anterior cruciate ligament repair 08, 09, 10  . Ercp, cbd stone extraction 2010  . Cholecystectomy 2010  . Egd/ dili 2010, 2012  . Breast surgery 2001    lumpectomy (01)  .  Colonoscopy 09/05/10    cecal avm's  . Esophagogastroduodenoscopy 01/08/2012    Procedure: ESOPHAGOGASTRODUODENOSCOPY (EGD);  Surgeon: [name], MD;  Location: [facility];  Service: Endoscopy;  Laterality: N/A;  . Savory dilation 01/08/2012    Procedure: SAVORY DILATION;  Surgeon: [name], MD;  Location: [facility];  Service: Endoscopy;  Laterality: N/A;  need xray  . Mastectomy modified radical     , Mastectomy modified radical (08), breast reconstruction, CA lesions excised lateral abd wall 2010  . Breast reconstructive surgery 2008, 2009, 2010    Family History  Problem Relation Age of Onset  . Heart disease Father   . Lung cancer Father     smoker  . Cirrhosis Sister     Primary Biliary  . Breast cancer    . Stroke Maternal Grandmother   . Alcohol abuse Maternal Grandfather   . Heart disease Paternal Grandfather   . Anxiety disorder Sister   . Osteoporosis Sister   . Skin cancer Sister   . Osteoporosis Sister   . Other Mother     tic deleauroux  . Anesthesia problems Neg Hx   . Hypotension Neg Hx   . Malignant hyperthermia Neg Hx   . Pseudochol deficiency Neg Hx   . Ovarian cancer    . Pancreatic cancer    . Colon cancer Neg Hx  History   Social History  . Marital Status: Married    Spouse Name: N/A    Number of Children: N/A  . Years of Education: N/A   Occupational History  . Not on file.   Social History Main Topics  . Smoking status: Former Smoker    Types: Cigarettes    Quit date: 05/22/2007  . Smokeless tobacco: Never Used  . Alcohol Use: 0.6 oz/week    1 Glasses of wine per week     6 oz daily  . Drug Use: No  . Sexually Active: Yes -- Female partner(s)   Other Topics Concern  . Not on file   Social History Narrative  . No narrative on file    Current Outpatient Prescriptions on File Prior to Visit  Medication Sig Dispense Refill  . amitriptyline (ELAVIL) 150 MG tablet Take 1 tablet (150 mg total) by mouth at bedtime.  90  tablet  1  . anastrozole (ARIMIDEX) 1 MG tablet Take 1 tablet (1 mg total) by mouth daily.  90 tablet  1  . aspirin 81 MG tablet Take 81 mg by mouth daily.        [name] atorvastatin (LIPITOR) 40 MG tablet Take 1 tablet (40 mg total) by mouth daily.  90 tablet  3  . beta carotene 91478 UNIT capsule Take 25,000 Units by mouth daily.        . calcium carbonate (TUMS - DOSED IN MG ELEMENTAL CALCIUM) 500 MG chewable tablet Chew 750 mg by mouth 2 (two) times daily.       . Cholecalciferol (VITAMIN D3) 1000 UNITS CAPS Take 2 tablets by mouth daily.        [name] FERRETTS 325 (106 FE) MG TABS take 1 tablet by mouth once daily  30 tablet  4  . LORazepam (ATIVAN) 1 MG tablet Take 1 tablet (1 mg total) by mouth every 8 (eight) hours as needed for anxiety.  60 tablet  2  . pantoprazole (PROTONIX) 40 MG tablet Take 1 tablet (40 mg total) by mouth 2 (two) times daily before a meal. 30-60 minutes before breakfast and supper  180 tablet  3  . Probiotic Product (PROBIOTIC FORMULA PO) Take 1 capsule by mouth daily.      . vitamin B-12 (CYANOCOBALAMIN) 1000 MCG tablet Take 1,000 mcg by mouth daily.          Allergies  Allergen Reactions  . Codeine   . Diphenhydramine Hcl   . Morphine   . Peanut-Containing Drug Products   . Zofran Other (See Comments)    HEADACHE    Review of Systems  Review of Systems  Constitutional: Negative for fever and malaise/fatigue.  HENT: Negative for congestion.   Eyes: Negative for discharge.  Respiratory: Negative for shortness of breath.   Cardiovascular: Negative for chest pain, palpitations and leg swelling.  Gastrointestinal: Negative for nausea, abdominal pain and diarrhea.  Genitourinary: Negative for dysuria.  Musculoskeletal: Positive for joint pain. Negative for falls.       B/l knee pain and swelling posteriorly  Skin: Negative for rash.  Neurological: Negative for loss of consciousness and headaches.  Endo/Heme/Allergies: Negative for polydipsia.    Psychiatric/Behavioral: Negative for depression and suicidal ideas. The patient is not nervous/anxious and does not have insomnia.     Objective  BP 102/71  Pulse 89  Temp 97.8 F (36.6 C) (Temporal)  Ht 5\' 6"  (1.676 m)  Wt 129 lb 12.8 oz (58.877 kg)  BMI 20.95 kg/m2  SpO2 93%  Physical Exam  Physical Exam  Constitutional: She is oriented to person, place, and time and well-developed, well-nourished, and in no distress. No distress.  HENT:  Head: Normocephalic and atraumatic.  Eyes: Conjunctivae are normal.  Neck: Neck supple. No thyromegaly present.  Cardiovascular: Normal rate, regular rhythm and normal heart sounds.   No murmur heard. Pulmonary/Chest: Effort normal and breath sounds normal. She has no wheezes.  Abdominal: She exhibits no distension and no mass.  Musculoskeletal: She exhibits no edema.  Lymphadenopathy:    She has no cervical adenopathy.  Neurological: She is alert and oriented to person, place, and time.  Skin: Skin is warm and dry. No rash noted. She is not diaphoretic.  Psychiatric: Memory, affect and judgment normal.    Lab Results  Component Value Date   TSH 3.51 12/21/2011   Lab Results  Component Value Date   WBC 4.2 04/29/2012   HGB 11.7 04/29/2012   HCT 34.9 04/29/2012   MCV 93.7 04/29/2012   PLT 232 04/29/2012   Lab Results  Component Value Date   CREATININE 0.72 04/29/2012   BUN 18 04/29/2012   NA 141 04/29/2012   K 3.6 04/29/2012   CL 104 04/29/2012   CO2 30 04/29/2012   Lab Results  Component Value Date   ALT 22 04/29/2012   AST 19 04/29/2012   ALKPHOS 104 04/29/2012   BILITOT 0.4 04/29/2012   Lab Results  Component Value Date   CHOL 188 12/21/2011   Lab Results  Component Value Date   HDL 79.80 12/21/2011   Lab Results  Component Value Date   LDLCALC 85 12/21/2011   Lab Results  Component Value Date   TRIG 116.0 12/21/2011   Lab Results  Component Value Date   CHOLHDL 2 12/21/2011     Assessment & Plan  Baker's cyst of  knee B/l knees, lately they have been bothering her more, she is referred to orthopaedics for further consideration  Tachycardia Numbers tolerable today

## 2012-07-21 ENCOUNTER — Other Ambulatory Visit: Payer: Self-pay | Admitting: *Deleted

## 2012-07-21 DIAGNOSIS — Z853 Personal history of malignant neoplasm of breast: Secondary | ICD-10-CM

## 2012-07-21 MED ORDER — ANASTROZOLE 1 MG PO TABS: 1.0000 mg | ORAL_TABLET | Freq: Every day | ORAL | Status: DC

## 2012-07-21 NOTE — Telephone Encounter (Signed)
Left a message for Anne [name] explaining that the [facility]  And [facility] have begun using the 3D imaging with mammograms in June 2013.   The 3D imaging allows the radiologist to see the breast tissue layer by layer.  This technology has decreased call backs for additional views, especially with dense breast tissue.   There has been a 40% increase in detections of lesions with technology.  If she wants to have this done with mammogram in 12/2012 she needs to tell them when she makes appt.  She will pay the  $50 at time of service. She will be reimbursed for the csot if her ins. Pays for technology.

## 2012-08-01 ENCOUNTER — Encounter: Payer: Self-pay | Admitting: Oncology

## 2012-08-01 NOTE — Progress Notes (Signed)
Communication from genetics counselor at [facility] in follow up of question of genetics testing previously: previous genetics counselor Annia Friendly did see patient but no blood was drawn. No information in [facility] per search thru that system. BRCA testing at the time would have been thru [facility], who have no record of testing on her.  This investigation by [name] genetics counselor appreciated and I can discuss with patient at next appointment.  Ila Mcgill, MD

## 2012-08-25 ENCOUNTER — Telehealth: Payer: Self-pay

## 2012-08-25 NOTE — Telephone Encounter (Signed)
Anne [name] called stating that she has been thinking a lot about having a prophylactic mastectomy on the right side.  She has not been sleeping well and concerned that she will eventually get cancer in that breast and die from it. She would like for Dr. Darrold Span to arrange this for her.

## 2012-09-01 ENCOUNTER — Encounter: Payer: Self-pay | Admitting: Family Medicine

## 2012-09-01 ENCOUNTER — Ambulatory Visit (INDEPENDENT_AMBULATORY_CARE_PROVIDER_SITE_OTHER): Payer: Medicare Other | Admitting: Family Medicine

## 2012-09-01 VITALS — BP 98/64 | HR 94 | Temp 97.9°F | Ht 66.0 in | Wt 131.8 lb

## 2012-09-01 DIAGNOSIS — K200 Eosinophilic esophagitis: Secondary | ICD-10-CM

## 2012-09-01 DIAGNOSIS — B379 Candidiasis, unspecified: Secondary | ICD-10-CM

## 2012-09-01 DIAGNOSIS — J029 Acute pharyngitis, unspecified: Secondary | ICD-10-CM

## 2012-09-01 DIAGNOSIS — J069 Acute upper respiratory infection, unspecified: Secondary | ICD-10-CM

## 2012-09-01 MED ORDER — SUCRALFATE 1 GM/10ML PO SUSP: 1.0000 g | Freq: Three times a day (TID) | ORAL | Status: DC

## 2012-09-01 MED ORDER — AMOXICILLIN 500 MG PO CAPS: 500.0000 mg | ORAL_CAPSULE | Freq: Three times a day (TID) | ORAL | Status: DC

## 2012-09-01 MED ORDER — PROBIOTIC PRODUCT PO CHEW: CHEWABLE_TABLET | ORAL | Status: DC

## 2012-09-01 MED ORDER — FLUCONAZOLE 150 MG PO TABS: 150.0000 mg | ORAL_TABLET | ORAL | Status: DC

## 2012-09-01 MED ORDER — FLUTICASONE PROPIONATE HFA 220 MCG/ACT IN AERO: 2.0000 | INHALATION_SPRAY | Freq: Two times a day (BID) | RESPIRATORY_TRACT | Status: DC

## 2012-09-01 NOTE — Patient Instructions (Addendum)
Hold flovent for 3 days then start back at 1 puff twice daily and up as tolerated  Viral Pharyngitis Viral pharyngitis is a viral infection that produces redness, pain, and swelling (inflammation) of the throat. It can spread from person to person (contagious). CAUSES Viral pharyngitis is caused by inhaling a large amount of certain germs called viruses. Many different viruses cause viral pharyngitis. SYMPTOMS Symptoms of viral pharyngitis include:  Sore throat.  Tiredness.  Stuffy nose.  Low-grade fever.  Congestion.  Cough. TREATMENT Treatment includes rest, drinking plenty of fluids, and the use of over-the-counter medication (approved by your caregiver). HOME CARE INSTRUCTIONS   Drink enough fluids to keep your urine clear or pale yellow.  Eat soft, cold foods such as ice cream, frozen ice pops, or gelatin dessert.  Gargle with warm salt water (1 tsp salt per 1 qt of water).  If over age 67, throat lozenges may be used safely.  Only take over-the-counter or prescription medicines for pain, discomfort, or fever as directed by your caregiver. Do not take aspirin. To help prevent spreading viral pharyngitis to others, avoid:  Mouth-to-mouth contact with others.  Sharing utensils for eating and drinking.  Coughing around others. SEEK MEDICAL CARE IF:   You are better in a few days, then become worse.  You have a fever or pain not helped by pain medicines.  There are any other changes that concern you. Document Released: 08/08/2005 Document Revised: 01/21/2012 Document Reviewed: 01/04/2011 [facility] Patient Information 2013 [place], [place].

## 2012-09-02 ENCOUNTER — Encounter: Payer: Self-pay | Admitting: Family Medicine

## 2012-09-02 ENCOUNTER — Telehealth: Payer: Self-pay

## 2012-09-02 DIAGNOSIS — J069 Acute upper respiratory infection, unspecified: Secondary | ICD-10-CM | POA: Insufficient documentation

## 2012-09-02 NOTE — Progress Notes (Signed)
Patient ID: Anne [name], female   DOB: [birth year], 71 y.o.   MRN: [id number] Anne [name] [id number] [birth year] 09/02/2012      Progress Note-Follow Up  Subjective  Chief Complaint  Chief Complaint  Patient presents with  . Medication Reaction    sore throat, neck pain, headache, cough, arm pain    HPI  Patient is a 71 year old Caucasian female who is here today complaining of 3-4 days worth of worsening sore throat and miscellaneous symptoms. Of concern is that she's having an adverse reaction to her Flovent. She has recently been seen an allergist and they were able to get her to start Flovent 220 mcg 2 puffs by mouth twice a day Colestid and saliva and swallowing. She says this is definitely helping her dysphagia and her ability to eat comfortably. Unfortunately roughly 4 days ago she developed a sore throat which is slowly worsening. She's having since this and discomfort in the left side of her neck as well as some generalized headache and malaise. She says she gets a sensation as if it is hard to breathe in her left upper chest as well. No fevers or chills. No cough, palpitations or chest pain. No GI complaints. Does have a low-grade cough is generally nonproductive.  Past Medical History  Diagnosis Date  . Anemia   . COPD (chronic obstructive pulmonary disease)   . Hyperlipidemia   . Pneumonia 2-10    pleurisy  . Arthritis   . Depression   . GERD (gastroesophageal reflux disease) 09/29/2009    improved s/p cholecystectomy and esophagus dilatation  . History of chicken pox   . History of shingles     2 episodes  . History of measles   . Osteopenia 03/14/2011  . 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 Pure hypercholesterolemia 10/17/2010  . 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 ESOPHAGEAL STRICTURE 03/29/2009  . CAROTID ARTERY STENOSIS 01/13/2010  . ALKALINE PHOSPHATASE, ELEVATED 03/15/2009  . Anxiety and depression 04/28/2011  . Vaginitis 05/22/2011    . Baker's cyst of knee 05/22/2011  . Trauma 10/18/2011  . Radial neck fracture 10/2011    minimally displaced  . Atypical chest pain 11/30/2011  . Folliculitis of nose 12/31/2011  . Esophageal ring   . 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 Urinary incontinence 03/19/2012  . Allergic state 06/10/2012  . Hematuria   . Dysphagia   . URI (upper respiratory infection) 09/02/2012    Past Surgical History  Procedure Date  . Anterior cruciate ligament repair 08, 09, 10  . Ercp, cbd stone extraction 2010  . Cholecystectomy 2010  . Egd/ dili 2010, 2012  . Breast surgery 2001    lumpectomy (01)  . Colonoscopy 09/05/10    cecal avm's  . Esophagogastroduodenoscopy 01/08/2012    Procedure: ESOPHAGOGASTRODUODENOSCOPY (EGD);  Surgeon: [name], MD;  Location: [facility];  Service: Endoscopy;  Laterality: N/A;  . Savory dilation 01/08/2012    Procedure: SAVORY DILATION;  Surgeon: [name], MD;  Location: [facility];  Service: Endoscopy;  Laterality: N/A;  need xray  . Mastectomy modified radical     , Mastectomy modified radical (08), breast reconstruction, CA lesions excised lateral abd wall 2010  . Breast reconstructive surgery 2008, 2009, 2010    Family History  Problem Relation Age of Onset  . Heart disease Father   . Lung cancer Father     smoker  .  Cirrhosis Sister     Primary Biliary  . Breast cancer    . Stroke Maternal Grandmother   . Alcohol abuse Maternal Grandfather   . Heart disease Paternal Grandfather   . Anxiety disorder Sister   . Osteoporosis Sister   . Skin cancer Sister   . Osteoporosis Sister   . Other Mother     tic deleauroux  . Anesthesia problems Neg Hx   . Hypotension Neg Hx   . Malignant hyperthermia Neg Hx   . Pseudochol deficiency Neg Hx   . Ovarian cancer    . Pancreatic cancer    . Colon cancer Neg Hx     History   Social History  . Marital Status: Married    Spouse Name: N/A    Number of Children: N/A  .  Years of Education: N/A   Occupational History  . Not on file.   Social History Main Topics  . Smoking status: Former Smoker    Types: Cigarettes    Quit date: 05/22/2007  . Smokeless tobacco: Never Used  . Alcohol Use: 0.6 oz/week    1 Glasses of wine per week     6 oz daily  . Drug Use: No  . Sexually Active: Yes -- Female partner(s)   Other Topics Concern  . Not on file   Social History Narrative  . No narrative on file    Current Outpatient Prescriptions on File Prior to Visit  Medication Sig Dispense Refill  . amitriptyline (ELAVIL) 150 MG tablet Take 1 tablet (150 mg total) by mouth at bedtime.  90 tablet  1  . anastrozole (ARIMIDEX) 1 MG tablet Take 1 tablet (1 mg total) by mouth daily.  90 tablet  1  . aspirin 81 MG tablet Take 81 mg by mouth daily.        [name] atorvastatin (LIPITOR) 40 MG tablet Take 1 tablet (40 mg total) by mouth daily.  90 tablet  3  . beta carotene 87564 UNIT capsule Take 25,000 Units by mouth daily.        . calcium carbonate (TUMS - DOSED IN MG ELEMENTAL CALCIUM) 500 MG chewable tablet Chew 750 mg by mouth 2 (two) times daily.       . Cholecalciferol (VITAMIN D3) 1000 UNITS CAPS Take 2 tablets by mouth daily.        [name] FERRETTS 325 (106 FE) MG TABS take 1 tablet by mouth once daily  30 tablet  4  . LORazepam (ATIVAN) 1 MG tablet Take 1 tablet (1 mg total) by mouth every 8 (eight) hours as needed for anxiety.  60 tablet  2  . pantoprazole (PROTONIX) 40 MG tablet Take 1 tablet (40 mg total) by mouth 2 (two) times daily before a meal. 30-60 minutes before breakfast and supper  180 tablet  3  . Probiotic Product (PROBIOTIC FORMULA PO) Take 1 capsule by mouth daily.      . vitamin B-12 (CYANOCOBALAMIN) 1000 MCG tablet Take 1,000 mcg by mouth daily.        . fluticasone (FLOVENT HFA) 220 MCG/ACT inhaler Inhale 2 puffs into the lungs 2 (two) times daily. swallow  1 Inhaler  12  . sucralfate (CARAFATE) 1 GM/10ML suspension Take 10 mLs (1 g total) by mouth 4  (four) times daily -  with meals and at bedtime. As needed for pain w ith swallowing  420 mL  0    Allergies  Allergen Reactions  . Codeine   . Diphenhydramine  Hcl   . Morphine   . Peanut-Containing Drug Products   . Zofran Other (See Comments)    HEADACHE    Review of Systems  Review of Systems  Constitutional: Positive for malaise/fatigue. Negative for fever and chills.  HENT: Positive for congestion and sore throat.   Eyes: Negative for discharge.  Respiratory: Negative for shortness of breath.   Cardiovascular: Negative for chest pain, palpitations and leg swelling.  Gastrointestinal: Negative for nausea, abdominal pain and diarrhea.  Genitourinary: Negative for dysuria.  Musculoskeletal: Negative for falls.  Skin: Negative for rash.  Neurological: Negative for loss of consciousness and headaches.  Endo/Heme/Allergies: Negative for polydipsia.  Psychiatric/Behavioral: Negative for depression and suicidal ideas. The patient is not nervous/anxious and does not have insomnia.     Objective  BP 98/64  Pulse 94  Temp 97.9 F (36.6 C) (Temporal)  Ht 5\' 6"  (1.676 m)  Wt 131 lb 12.8 oz (59.784 kg)  BMI 21.27 kg/m2  SpO2 99%  Physical Exam  Physical Exam  Constitutional: She is oriented to person, place, and time and well-developed, well-nourished, and in no distress. No distress.  HENT:  Head: Normocephalic and atraumatic.       Oral mucosa and oropharynx, erythematous and mildly edematous.   Eyes: Conjunctivae normal are normal.  Neck: Neck supple. No thyromegaly present.  Cardiovascular: Normal rate, regular rhythm and normal heart sounds.   No murmur heard. Pulmonary/Chest: Effort normal and breath sounds normal. She has no wheezes.  Abdominal: She exhibits no distension and no mass.  Musculoskeletal: She exhibits no edema.  Lymphadenopathy:    She has cervical adenopathy.  Neurological: She is alert and oriented to person, place, and time.  Skin: Skin is warm  and dry. No rash noted. She is not diaphoretic.  Psychiatric: Memory, affect and judgment normal.    Lab Results  Component Value Date   TSH 3.51 12/21/2011   Lab Results  Component Value Date   WBC 4.2 04/29/2012   HGB 11.7 04/29/2012   HCT 34.9 04/29/2012   MCV 93.7 04/29/2012   PLT 232 04/29/2012   Lab Results  Component Value Date   CREATININE 0.72 04/29/2012   BUN 18 04/29/2012   NA 141 04/29/2012   K 3.6 04/29/2012   CL 104 04/29/2012   CO2 30 04/29/2012   Lab Results  Component Value Date   ALT 22 04/29/2012   AST 19 04/29/2012   ALKPHOS 104 04/29/2012   BILITOT 0.4 04/29/2012   Lab Results  Component Value Date   CHOL 188 12/21/2011   Lab Results  Component Value Date   HDL 79.80 12/21/2011   Lab Results  Component Value Date   LDLCALC 85 12/21/2011   Lab Results  Component Value Date   TRIG 116.0 12/21/2011   Lab Results  Component Value Date   CHOLHDL 2 12/21/2011     Assessment & Plan  Eosinophilic esophagitis Patient has now seen Dr Sharyn Lull of ENT and has chosen to start the Flovent 220, 2 puffs po collected in saliva and swallowed as directed. She has definitely noted a good response. She is having a lot less trouble with dysphasia and he is able to eat without feeling as if she is choking frequently. She appears to develop a secondary URI infection over the last 2 days and is concerned she is having a reaction to the medication. She is offered reassurance this appears to be in acute illness. There is a hent yeast in her mouth  however. There is a lot of erythema and some mild edema noted on her palate. We will give her Diflucan 150 mg once weekly for the next 2 weeks and then she is encouraged to rinse her mouth out thoroughly after using the Flovent. She will notify us if she is not improving or if symptoms worsen  URI (upper respiratory infection) Appears viral, encouraged increased rest and hydration. Patient is given a prescription for Amoxicillin if symptoms worsen  or fevers etc develop she can star this, also encouraged to try probiotic, [facility]

## 2012-09-02 NOTE — Assessment & Plan Note (Signed)
Patient has now seen Dr Sharyn Lull of ENT and has chosen to start the Flovent 220, 2 puffs po collected in saliva and swallowed as directed. She has definitely noted a good response. She is having a lot less trouble with dysphasia and he is able to eat without feeling as if she is choking frequently. She appears to develop a secondary URI infection over the last 2 days and is concerned she is having a reaction to the medication. She is offered reassurance this appears to be in acute illness. There is a hent yeast in her mouth however. There is a lot of erythema and some mild edema noted on her palate. We will give her Diflucan 150 mg once weekly for the next 2 weeks and then she is encouraged to rinse her mouth out thoroughly after using the Flovent. She will notify us if she is not improving or if symptoms worsen

## 2012-09-02 NOTE — Telephone Encounter (Signed)
Patient left a message stating that she had a rheumatoid test in May and still doesn't understand results? Please call pt back.  I left a detailed message stating that her number was below 14 (10) that this was normal

## 2012-09-02 NOTE — Assessment & Plan Note (Signed)
Appears viral, encouraged increased rest and hydration. Patient is given a prescription for Amoxicillin if symptoms worsen or fevers etc develop she can star this, also encouraged to try probiotic, [facility]

## 2012-09-02 NOTE — Telephone Encounter (Signed)
Requested Ms. Faulkner call back regarding Dr. Darrold Span setting up  surgeon for consult. When Ms. League calls back she can be told taht Dr. Darrold Span said that it is fine for Ms. Gopaul to discuss with Careers adviser.  Dr. Jamey Ripa knows her, and she can call that office for appointment to discuss.

## 2012-09-12 ENCOUNTER — Telehealth: Payer: Self-pay

## 2012-09-12 NOTE — Telephone Encounter (Signed)
Left message stating that Dr. Darrold Span said that she can call her surgeon Dr. Jamey Ripa and set up appt. to discuss mastectomy.

## 2012-09-17 ENCOUNTER — Telehealth: Payer: Self-pay

## 2012-09-17 NOTE — Telephone Encounter (Signed)
Called [facility].  The bone density results are not final.  The report will be faxed to [phone] when available. Anne [name] mentioned having the results e-mailed to her.  Will offer to mail the results to her, as E-mail capability is not available, when results discussed with her.

## 2012-09-18 ENCOUNTER — Other Ambulatory Visit: Payer: Self-pay | Admitting: Family Medicine

## 2012-09-18 NOTE — Telephone Encounter (Signed)
Please advise refill? Last RX 06-10-12 quantity 60 with 2 refills.  If ok send to [phone]

## 2012-09-19 ENCOUNTER — Encounter: Payer: Self-pay | Admitting: Family Medicine

## 2012-09-19 ENCOUNTER — Ambulatory Visit (INDEPENDENT_AMBULATORY_CARE_PROVIDER_SITE_OTHER): Payer: Medicare Other | Admitting: Family Medicine

## 2012-09-19 VITALS — BP 129/84 | HR 96 | Temp 97.6°F | Ht 66.0 in | Wt 131.8 lb

## 2012-09-19 DIAGNOSIS — F1010 Alcohol abuse, uncomplicated: Secondary | ICD-10-CM

## 2012-09-19 DIAGNOSIS — K222 Esophageal obstruction: Secondary | ICD-10-CM

## 2012-09-19 MED ORDER — LORAZEPAM 1 MG PO TABS: 1.0000 mg | ORAL_TABLET | Freq: Four times a day (QID) | ORAL | Status: DC | PRN

## 2012-09-19 NOTE — Assessment & Plan Note (Signed)
She describes an episode of esophageal spasm last night after eating some spicy food. If unable to get relief with pantoprazole for any of her medications. She did not try her Carafate. She is asked to restart her Carafate 4 times daily for the next week and titrate back as needed. Seek immediate care symptoms worsen.

## 2012-09-19 NOTE — Patient Instructions (Addendum)
Alcohol Withdrawal °Anytime drug use is interfering with normal living activities it has become abuse. This includes problems with family and friends. Psychological dependence has developed when your mind tells you that the drug is needed. This is usually followed by physical dependence when a continuing increase of drugs are required to get the same feeling or "high." This is known as addiction or chemical dependency. A person's risk is much higher if there is a history of chemical dependency in the family. °Mild Withdrawal Following Stopping Alcohol, When Addiction or Chemical Dependency Has Developed °When a person has developed tolerance to alcohol, any sudden stopping of alcohol can cause uncomfortable physical symptoms. Most of the time these are mild and consist of tremors in the hands and increases in heart rate, breathing, and temperature. Sometimes these symptoms are associated with anxiety, panic attacks, and bad dreams. There may also be stomach upset. Normal sleep patterns are often interrupted with periods of inability to sleep (insomnia). This may last for 6 months. Because of this discomfort, many people choose to continue drinking to get rid of this discomfort and to try to feel normal. °Severe Withdrawal with Decreased or No Alcohol Intake, When Addiction or Chemical Dependency Has Developed °About five percent of alcoholics will develop signs of severe withdrawal when they stop using alcohol. One sign of this is development of generalized seizures (convulsions). Other signs of this are severe agitation and confusion. This may be associated with believing in things which are not real or seeing things which are not really there (delusions and hallucinations). Vitamin deficiencies are usually present if alcohol intake has been long-term. Treatment for this most often requires hospitalization and close observation. °Addiction can only be helped by stopping use of all chemicals. This is hard but may  save your life. With continual alcohol use, possible outcomes are usually loss of self respect and esteem, violence, and death. °Addiction cannot be cured but it can be stopped. This often requires outside help and the care of professionals. Treatment centers are listed in the yellow pages under Cocaine, Narcotics, and Alcoholics Anonymous. Most hospitals and clinics can refer you to a specialized care center. °It is not necessary for you to go through the uncomfortable symptoms of withdrawal. Your caregiver can provide you with medicines that will help you through this difficult period. Try to avoid situations, friends, or drugs that made it possible for you to keep using alcohol in the past. Learn how to say no. °It takes a long period of time to overcome addictions to all drugs, including alcohol. There may be many times when you feel as though you want a drink. After getting rid of the physical addiction and withdrawal, you will have a lessening of the craving which tells you that you need alcohol to feel normal. Call your caregiver if more support is needed. Learn who to talk to in your family and among your friends so that during these periods you can receive outside help. Alcoholics Anonymous (AA) has helped many people over the years. To get further help, contact AA or call your caregiver, counselor, or clergyperson. Al-Anon and Alateen are support groups for friends and family members of an alcoholic. The people who love and care for an alcoholic often need help, too. For information about these organizations, check your phone directory or call a local alcoholism treatment center.  °SEEK IMMEDIATE MEDICAL CARE IF:  °· You have a seizure. °· You have a fever. °· You experience uncontrolled vomiting or you   You have a fever.   You experience uncontrolled vomiting or you vomit up blood. This may be bright red or look like black coffee grounds.   You have blood in the stool. This may be bright red or appear as a black, tarry, bad-smelling stool.   You become lightheaded or  faint. Do not drive if you feel this way. Have someone else drive you or call 911 for help.   You become more agitated or confused.   You develop uncontrolled anxiety.   You begin to see things that are not really there (hallucinate).  Your caregiver has determined that you completely understand your medical condition, and that your mental state is back to normal. You understand that you have been treated for alcohol withdrawal, have agreed not to drink any alcohol for a minimum of 1 day, will not operate a car or other machinery for 24 hours, and have had an opportunity to ask any questions about your condition.  Document Released: 08/08/2005 Document Revised: 01/21/2012 Document Reviewed: 06/16/2008  [employer] Patient Information 2013 [employer]

## 2012-09-19 NOTE — Progress Notes (Signed)
Patient ID: Anne [name], female   DOB: 06/24/1941, 71 y.o.   MRN: [id number] Anne [name] [id number] February 05, 1941 09/19/2012      Progress Note-Follow Up  Subjective  Chief Complaint  Chief Complaint  Patient presents with  . Follow-up    HPI  Patient is a 71 year old Caucasian female who is in today to discuss her alcohol consumption. Once again she is on backup her thinking over about wine a day. She's decided to cut back and has been down to 48 ounces but unfortunately as a result has had some bad nightmares. They deny any tremors or palpitations. He denies any hallucinations or change in mental status. She is also noting last night she had a very severe esophageal spasm after eating some mildly spicy food. They tried her pantoprazole as well as TUMS and other over-the-counter medications and only had minor relief. No bloody or tarry stool no change in bowel habits and otherwise she says her health has been good. No fevers or chills, shortness of breath.  Past Medical History  Diagnosis Date  . Anemia   . COPD (chronic obstructive pulmonary disease)   . Hyperlipidemia   . Pneumonia 2-10    pleurisy  . Arthritis   . Depression   . GERD (gastroesophageal reflux disease) 09/29/2009    improved s/p cholecystectomy and esophagus dilatation  . History of chicken pox   . History of shingles     2 episodes  . History of measles   . Osteopenia 03/14/2011  . 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 Pure hypercholesterolemia 10/17/2010  . 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 ESOPHAGEAL STRICTURE 03/29/2009  . CAROTID ARTERY STENOSIS 01/13/2010  . ALKALINE PHOSPHATASE, ELEVATED 03/15/2009  . Anxiety and depression 04/28/2011  . Vaginitis 05/22/2011  . Baker's cyst of knee 05/22/2011  . Trauma 10/18/2011  . Radial neck fracture 10/2011    minimally displaced  . Atypical chest pain 11/30/2011  . Folliculitis of nose 12/31/2011  . Esophageal ring   . AVM (arteriovenous  malformation) of colon 2011    cecum  . Urinary incontinence 03/19/2012  . Allergic state 06/10/2012  . Hematuria   . Dysphagia   . URI (upper respiratory infection) 09/02/2012    Past Surgical History  Procedure Date  . Anterior cruciate ligament repair 08, 09, 10  . Ercp, cbd stone extraction 2010  . Cholecystectomy 2010  . Egd/ dili 2010, 2012  . Breast surgery 2001    lumpectomy (01)  . Colonoscopy 09/05/10    cecal avm's  . Esophagogastroduodenoscopy 01/08/2012    Procedure: ESOPHAGOGASTRODUODENOSCOPY (EGD);  Surgeon: [name], MD;  Location: [facility];  Service: Endoscopy;  Laterality: N/A;  . Savory dilation 01/08/2012    Procedure: SAVORY DILATION;  Surgeon: [name], MD;  Location: [facility];  Service: Endoscopy;  Laterality: N/A;  need xray  . Mastectomy modified radical     , Mastectomy modified radical (08), breast reconstruction, CA lesions excised lateral abd wall 2010  . Breast reconstructive surgery 2008, 2009, 2010    Family History  Problem Relation Age of Onset  . Heart disease Father   . Lung cancer Father     smoker  . Cirrhosis Sister     Primary Biliary  . Breast cancer    . Stroke Maternal Grandmother   . Alcohol abuse Maternal Grandfather   . Heart disease Paternal Grandfather   . Anxiety disorder Sister   .  Osteoporosis Sister   . Skin cancer Sister   . Osteoporosis Sister   . Other Mother     tic deleauroux  . Anesthesia problems Neg Hx   . Hypotension Neg Hx   . Malignant hyperthermia Neg Hx   . Pseudochol deficiency Neg Hx   . Ovarian cancer    . Pancreatic cancer    . Colon cancer Neg Hx     History   Social History  . Marital Status: Married    Spouse Name: N/A    Number of Children: N/A  . Years of Education: N/A   Occupational History  . Not on file.   Social History Main Topics  . Smoking status: Former Smoker    Types: Cigarettes    Quit date: 05/22/2007  . Smokeless tobacco: Never Used  . Alcohol  Use: 0.6 oz/week    1 Glasses of wine per week     Comment: 6 oz daily  . Drug Use: No  . Sexually Active: Yes -- Female partner(s)   Other Topics Concern  . Not on file   Social History Narrative  . No narrative on file    Current Outpatient Prescriptions on File Prior to Visit  Medication Sig Dispense Refill  . amitriptyline (ELAVIL) 150 MG tablet Take 1 tablet (150 mg total) by mouth at bedtime.  90 tablet  1  . anastrozole (ARIMIDEX) 1 MG tablet Take 1 tablet (1 mg total) by mouth daily.  90 tablet  1  . aspirin 81 MG tablet Take 81 mg by mouth daily.        [name] atorvastatin (LIPITOR) 40 MG tablet Take 1 tablet (40 mg total) by mouth daily.  90 tablet  3  . beta carotene 98119 UNIT capsule Take 25,000 Units by mouth daily.        . calcium carbonate (TUMS - DOSED IN MG ELEMENTAL CALCIUM) 500 MG chewable tablet Chew 750 mg by mouth 2 (two) times daily.       . Cholecalciferol (VITAMIN D3) 1000 UNITS CAPS Take 2 tablets by mouth daily.        [name] FERRETTS 325 (106 FE) MG TABS take 1 tablet by mouth once daily  30 tablet  4  . fluticasone (FLOVENT HFA) 220 MCG/ACT inhaler Inhale 2 puffs into the lungs 2 (two) times daily. swallow  1 Inhaler  12  . pantoprazole (PROTONIX) 40 MG tablet Take 1 tablet (40 mg total) by mouth 2 (two) times daily before a meal. 30-60 minutes before breakfast and supper  180 tablet  3  . Probiotic Product (MISC INTESTINAL FLORA REGULAT) CHEW Digestive Health Probiotics by Schiff    0  . Probiotic Product (PROBIOTIC FORMULA PO) Take 1 capsule by mouth daily.      . sucralfate (CARAFATE) 1 GM/10ML suspension Take 10 mLs (1 g total) by mouth 4 (four) times daily -  with meals and at bedtime. As needed for pain w ith swallowing  420 mL  0  . vitamin B-12 (CYANOCOBALAMIN) 1000 MCG tablet Take 1,000 mcg by mouth daily.        [name] amoxicillin (AMOXIL) 500 MG capsule Take 1 capsule (500 mg total) by mouth 3 (three) times daily. Only use if fevers, congestion or worsening  symptoms develop  30 capsule  0  . fluconazole (DIFLUCAN) 150 MG tablet Take 1 tablet (150 mg total) by mouth once a week.  2 tablet  1    Allergies  Allergen Reactions  .  Codeine   . Diphenhydramine Hcl   . Morphine   . Peanut-Containing Drug Products   . Zofran Other (See Comments)    HEADACHE    Review of Systems  Review of Systems  Constitutional: Negative for fever and malaise/fatigue.  HENT: Negative for congestion.   Eyes: Negative for discharge.  Respiratory: Negative for shortness of breath.   Cardiovascular: Negative for chest pain, palpitations and leg swelling.  Gastrointestinal: Negative for nausea, abdominal pain and diarrhea.  Genitourinary: Negative for dysuria.  Musculoskeletal: Negative for falls.  Skin: Negative for rash.  Neurological: Negative for loss of consciousness and headaches.  Endo/Heme/Allergies: Negative for polydipsia.  Psychiatric/Behavioral: Negative for depression and suicidal ideas. The patient is not nervous/anxious and does not have insomnia.     Objective  BP 129/84  Pulse 96  Temp 97.6 F (36.4 C) (Temporal)  Ht 5\' 6"  (1.676 m)  Wt 131 lb 12.8 oz (59.784 kg)  BMI 21.27 kg/m2  SpO2 94%  Physical Exam  Physical Exam  Constitutional: She is oriented to person, place, and time and well-developed, well-nourished, and in no distress. No distress.  HENT:  Head: Normocephalic and atraumatic.  Eyes: Conjunctivae normal are normal.  Neck: Neck supple. No thyromegaly present.  Cardiovascular: Normal rate, regular rhythm and normal heart sounds.   No murmur heard. Pulmonary/Chest: Effort normal and breath sounds normal. She has no wheezes.  Abdominal: She exhibits no distension and no mass.  Musculoskeletal: She exhibits no edema.  Lymphadenopathy:    She has no cervical adenopathy.  Neurological: She is alert and oriented to person, place, and time.  Skin: Skin is warm and dry. No rash noted. She is not diaphoretic.  Psychiatric:  Memory, affect and judgment normal.    Lab Results  Component Value Date   TSH 3.51 12/21/2011   Lab Results  Component Value Date   WBC 4.2 04/29/2012   HGB 11.7 04/29/2012   HCT 34.9 04/29/2012   MCV 93.7 04/29/2012   PLT 232 04/29/2012   Lab Results  Component Value Date   CREATININE 0.72 04/29/2012   BUN 18 04/29/2012   NA 141 04/29/2012   K 3.6 04/29/2012   CL 104 04/29/2012   CO2 30 04/29/2012   Lab Results  Component Value Date   ALT 22 04/29/2012   AST 19 04/29/2012   ALKPHOS 104 04/29/2012   BILITOT 0.4 04/29/2012   Lab Results  Component Value Date   CHOL 188 12/21/2011   Lab Results  Component Value Date   HDL 79.80 12/21/2011   Lab Results  Component Value Date   LDLCALC 85 12/21/2011   Lab Results  Component Value Date   TRIG 116.0 12/21/2011   Lab Results  Component Value Date   CHOLHDL 2 12/21/2011     Assessment & Plan  Alcohol abuse Had increased her alcohol consumption for over a bottle of wine a day again recently. Is now trying to cut back. Has results found for an 8 ounces and has had several bad dreams. We will already increased her Ativan 1 mg to 4 times daily for the next 7-10 days and then to drop to 3 times a day for 7 days and then back to twice a day. She has to agree to continue to cut back on alcohol and to attempt to consume less than 6 ounces a day.  ESOPHAGEAL STRICTURE She describes an episode of esophageal spasm last night after eating some spicy food. If unable to get relief  with pantoprazole for any of her medications. She did not try her Carafate. She is asked to restart her Carafate 4 times daily for the next week and titrate back as needed. Seek immediate care symptoms worsen.

## 2012-09-19 NOTE — Assessment & Plan Note (Signed)
Had increased her alcohol consumption for over a bottle of wine a day again recently. Is now trying to cut back. Has results found for an 8 ounces and has had several bad dreams. We will already increased her Ativan 1 mg to 4 times daily for the next 7-10 days and then to drop to 3 times a day for 7 days and then back to twice a day. She has to agree to continue to cut back on alcohol and to attempt to consume less than 6 ounces a day.

## 2012-09-22 NOTE — Telephone Encounter (Signed)
Spoke with Ms. Bordas and told her that the bone density test shows osteopenia, lowest in the LS Spine, but stable from 2012 per  Dr. Darrold Span.  She is to continue 1200-1400 mg of calcium daily as Tums and weight bearing exercise such as walking  Daily.   Regular alcohol is risk factor for low bone density.  Ms. Formosa stated that she has taken care of that issue. She is to see Dr. Jamey Ripa tomorrow for consideration of prophylactic mastectomy.  She will call to schedule an appt. To discuss surgery if not fully comfortable after discussion with Dr. Jamey Ripa.

## 2012-09-23 ENCOUNTER — Ambulatory Visit (INDEPENDENT_AMBULATORY_CARE_PROVIDER_SITE_OTHER): Payer: Medicare Other | Admitting: Surgery

## 2012-09-23 ENCOUNTER — Encounter (INDEPENDENT_AMBULATORY_CARE_PROVIDER_SITE_OTHER): Payer: Self-pay | Admitting: Surgery

## 2012-09-23 VITALS — BP 132/74 | HR 98 | Temp 98.3°F | Resp 18 | Ht 65.5 in | Wt 131.0 lb

## 2012-09-23 DIAGNOSIS — Z853 Personal history of malignant neoplasm of breast: Secondary | ICD-10-CM

## 2012-09-23 NOTE — Patient Instructions (Signed)
If you wish to have a prophylactic mastectomy we will work to co-ordinate that with a Engineer, petroleum. You will need a mammogram prior to surgery

## 2012-09-23 NOTE — Progress Notes (Signed)
NAME: Anne [name]       DOB: [birth year]           DATE: 09/23/2012       MRN: [id number]   Anne [name] is a 71 y.o.Marland Kitchenfemale who presents for routine followup of her Right breast cancer  diagnosed in 2001 with a local recurrence in 2010 and treated with mastectomy, ax dissectin. She has an implant in place. She has no problems or concerns on either side.She is follwed by Dr Darrold Span. She wants a prophylactic left mastectomy.  PFSH: She has had no significant changes since the last visit here.  ROS: There have been no significant changes since the last visit here  EXAM:  VS: BP 132/74  Pulse 98  Temp 98.3 F (36.8 C) (Oral)  Resp 18  Ht 5' 5.5" (1.664 m)  Wt 131 lb (59.421 kg)  BMI 21.47 kg/m2  General: The patient is alert, oriented, generally healthy appearing, NAD. Mood and affect are normal.  Breasts:  Right s/p mastectomy. The implant rides high. Left is unremarkable  Lymphatics: She has no axillary or supraclavicular adenopathy on either side. ? Of a tiny right supraclavicular nodule which she says has been present for five years  Extremities: Full ROM of the surgical side with no lymphedema noted.  Data Reviewed: PET scan and last mammo negative  Impression: Doing well, with no evidence of recurrent cancer or new cancer  Plan: Discussed prophylactic mastectomy with her and her husband. I told her that I did not think that had any chance of improving her cure rate, only reducing her risk of a new left-sided breast cancer whichis not very large.she also wants to have a reconstruction. If, she does not on a mastectomy, she needs to have a current mammogram prior to surgery. She also need to arrange a Engineer, petroleum. I told her she could talk to Dr. Darrold Span and also to a plastic surgeon and then we can make definitive plans.

## 2012-09-30 ENCOUNTER — Encounter (INDEPENDENT_AMBULATORY_CARE_PROVIDER_SITE_OTHER): Payer: Medicare Other | Admitting: Surgery

## 2012-10-14 ENCOUNTER — Other Ambulatory Visit: Payer: Self-pay | Admitting: Family Medicine

## 2012-10-19 ENCOUNTER — Other Ambulatory Visit: Payer: Self-pay | Admitting: Family Medicine

## 2012-10-21 ENCOUNTER — Encounter: Payer: Self-pay | Admitting: Family Medicine

## 2012-10-21 ENCOUNTER — Ambulatory Visit (INDEPENDENT_AMBULATORY_CARE_PROVIDER_SITE_OTHER): Payer: Medicare Other | Admitting: Family Medicine

## 2012-10-21 VITALS — BP 115/79 | HR 84 | Temp 97.6°F | Ht 66.0 in | Wt 130.1 lb

## 2012-10-21 DIAGNOSIS — Z23 Encounter for immunization: Secondary | ICD-10-CM

## 2012-10-21 DIAGNOSIS — Z0000 Encounter for general adult medical examination without abnormal findings: Secondary | ICD-10-CM | POA: Insufficient documentation

## 2012-10-21 DIAGNOSIS — F1010 Alcohol abuse, uncomplicated: Secondary | ICD-10-CM

## 2012-10-21 DIAGNOSIS — F341 Dysthymic disorder: Secondary | ICD-10-CM

## 2012-10-21 DIAGNOSIS — F419 Anxiety disorder, unspecified: Secondary | ICD-10-CM

## 2012-10-21 DIAGNOSIS — K219 Gastro-esophageal reflux disease without esophagitis: Secondary | ICD-10-CM

## 2012-10-21 DIAGNOSIS — R000 Tachycardia, unspecified: Secondary | ICD-10-CM

## 2012-10-21 DIAGNOSIS — M7120 Synovial cyst of popliteal space [Baker], unspecified knee: Secondary | ICD-10-CM

## 2012-10-21 DIAGNOSIS — R319 Hematuria, unspecified: Secondary | ICD-10-CM

## 2012-10-21 DIAGNOSIS — E7800 Pure hypercholesterolemia, unspecified: Secondary | ICD-10-CM

## 2012-10-21 DIAGNOSIS — D649 Anemia, unspecified: Secondary | ICD-10-CM

## 2012-10-21 LAB — HEPATIC FUNCTION PANEL
AST: 22 U/L (ref 0–37)
Alkaline Phosphatase: 107 U/L (ref 39–117)
Total Bilirubin: 0.6 mg/dL (ref 0.3–1.2)

## 2012-10-21 LAB — CBC
MCHC: 32.3 g/dL (ref 30.0–36.0)
RDW: 14.2 % (ref 11.5–14.6)
WBC: 5.8 10*3/uL (ref 4.5–10.5)

## 2012-10-21 LAB — RENAL FUNCTION PANEL
CO2: 31 mEq/L (ref 19–32)
Chloride: 101 mEq/L (ref 96–112)
GFR: 87.9 mL/min (ref >60.00)
Phosphorus: 4 mg/dL (ref 2.3–4.6)
Potassium: 4.2 mEq/L (ref 3.5–5.1)
Sodium: 137 mEq/L (ref 135–145)

## 2012-10-21 LAB — POCT URINALYSIS DIPSTICK
Bilirubin, UA: NEGATIVE
Blood, UA: NEGATIVE
Ketones, UA: NEGATIVE
Protein, UA: NEGATIVE
pH, UA: 7

## 2012-10-21 LAB — LIPID PANEL
HDL: 74.7 mg/dL (ref >39.00)
Total CHOL/HDL Ratio: 3
Triglycerides: 136 mg/dL (ref 0.0–149.0)
VLDL: 27.2 mg/dL (ref 0.0–40.0)

## 2012-10-21 MED ORDER — LORAZEPAM 1 MG PO TABS: 1.0000 mg | ORAL_TABLET | Freq: Four times a day (QID) | ORAL | Status: DC | PRN

## 2012-10-21 NOTE — Assessment & Plan Note (Signed)
Has cut down to 2 glasses of wine a day

## 2012-10-21 NOTE — Assessment & Plan Note (Signed)
Walking 2+ miles a day. Has chosen to not fully stop drinking alcohol but is down to 2 glasses of wine a day (4 oz)  Daily. Is eating well and sleeping well. Generally feels week. Given flu shot today.

## 2012-10-21 NOTE — Assessment & Plan Note (Signed)
Doing much better in this regard without any dysphagia secondary to her new inhaled steroids, will continue the same and she will avoid offending foods

## 2012-10-21 NOTE — Progress Notes (Signed)
Patient ID: Anne [name], female   DOB: 1940/12/11, 71 y.o.   MRN: [id number] [name] [id number] [birth year] 10/21/2012      Progress Note-Follow Up  Subjective  Chief Complaint  Chief Complaint  Patient presents with  . Annual Exam    physical  . Injections    flu    HPI  Patient is a 71 year old Caucasian female who is in today for annual exam and followup. Overall she feels well. She's not had any recent illness or fevers or she continues to struggle with a difficult marriage but finds it tolerable at this time. I do daily.obtain care for self recently with good eating habits regular exercise. She wants to go spouse today. Does have some trouble with knee pain left greater than right but Aspercreme is helpful. Sees a dentist routinely. He is sleeping better with the combination of amitriptyline and lorazepam very infrequently needs medications during the day. Continue to follow closely with oncology for her breast cancer. No complaints of congestion, headache, chest pain, palpitations, shortness of breath, GI or GU concerns today  Past Medical History  Diagnosis Date  . Anemia   . COPD (chronic obstructive pulmonary disease)   . Hyperlipidemia   . Pneumonia 2-10    pleurisy  . Arthritis   . Depression   . GERD (gastroesophageal reflux disease) 09/29/2009    improved s/p cholecystectomy and esophagus dilatation  . History of chicken pox   . History of shingles     2 episodes  . History of measles   . Osteopenia 03/14/2011  . 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 Pure hypercholesterolemia 10/17/2010  . 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 ESOPHAGEAL STRICTURE 03/29/2009  . CAROTID ARTERY STENOSIS 01/13/2010  . ALKALINE PHOSPHATASE, ELEVATED 03/15/2009  . Anxiety and depression 04/28/2011  . Vaginitis 05/22/2011  . Baker's cyst of knee 05/22/2011  . Trauma 10/18/2011  . Radial neck fracture 10/2011    minimally displaced  . Atypical chest pain  11/30/2011  . Folliculitis of nose 12/31/2011  . Esophageal ring   . 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 Urinary incontinence 03/19/2012  . Allergic state 06/10/2012  . Hematuria   . Dysphagia   . URI (upper respiratory infection) 09/02/2012  . Preventative health care 10/21/2012    Past Surgical History  Procedure Date  . Anterior cruciate ligament repair 08, 09, 10  . Ercp, cbd stone extraction 2010  . Cholecystectomy 2010  . Egd/ dili 2010, 2012  . Breast surgery 2001    lumpectomy (01)  . Colonoscopy 09/05/10    cecal avm's  . Esophagogastroduodenoscopy 01/08/2012    Procedure: ESOPHAGOGASTRODUODENOSCOPY (EGD);  Surgeon: [name], MD;  Location: [facility];  Service: Endoscopy;  Laterality: N/A;  . Savory dilation 01/08/2012    Procedure: SAVORY DILATION;  Surgeon: [name], MD;  Location: [facility];  Service: Endoscopy;  Laterality: N/A;  need xray  . Mastectomy modified radical     , Mastectomy modified radical (08), breast reconstruction, CA lesions excised lateral abd wall 2010  . Breast reconstructive surgery 2008, 2009, 2010    Family History  Problem Relation Age of Onset  . Heart disease Father   . Lung cancer Father     smoker  . Cirrhosis Sister     Primary Biliary  . Breast cancer    . Stroke Maternal Grandmother   . Alcohol abuse  Maternal Grandfather   . Heart disease Paternal Grandfather   . Anxiety disorder Sister   . Osteoporosis Sister   . Arthritis Sister     Rheumatoid  . Skin cancer Sister   . Osteoporosis Sister   . Cancer Sister     multiple skin cancers, over 90 excisions.  . Other Mother     tic deleauroux  . Anesthesia problems Neg Hx   . Hypotension Neg Hx   . Malignant hyperthermia Neg Hx   . Pseudochol deficiency Neg Hx   . Colon cancer Neg Hx   . Ovarian cancer    . Pancreatic cancer      History   Social History  . Marital Status: Married    Spouse Name: N/A    Number of Children: N/A   . Years of Education: N/A   Occupational History  . Not on file.   Social History Main Topics  . Smoking status: Former Smoker    Types: Cigarettes    Quit date: 05/22/2007  . Smokeless tobacco: Never Used  . Alcohol Use: 0.6 oz/week    1 Glasses of wine per week     Comment: 6 oz daily  . Drug Use: No  . Sexually Active: Yes -- Female partner(s)   Other Topics Concern  . Not on file   Social History Narrative  . No narrative on file    Current Outpatient Prescriptions on File Prior to Visit  Medication Sig Dispense Refill  . amitriptyline (ELAVIL) 150 MG tablet take 1 tablet by mouth at bedtime  30 tablet  1  . anastrozole (ARIMIDEX) 1 MG tablet Take 1 tablet (1 mg total) by mouth daily.  90 tablet  1  . aspirin 81 MG tablet Take 81 mg by mouth daily.        [name] atorvastatin (LIPITOR) 40 MG tablet Take 1 tablet (40 mg total) by mouth daily.  90 tablet  3  . beta carotene 16109 UNIT capsule Take 25,000 Units by mouth daily.        . calcium carbonate (TUMS - DOSED IN MG ELEMENTAL CALCIUM) 500 MG chewable tablet Chew 750 mg by mouth 2 (two) times daily.       . Cholecalciferol (VITAMIN D3) 1000 UNITS CAPS Take 2 tablets by mouth daily.        [name] FERRETTS 325 (106 FE) MG TABS take 1 tablet by mouth once daily  30 tablet  4  . fluconazole (DIFLUCAN) 150 MG tablet Take 1 tablet (150 mg total) by mouth once a week.  2 tablet  1  . fluticasone (FLOVENT HFA) 220 MCG/ACT inhaler Inhale 2 puffs into the lungs 2 (two) times daily. swallow  1 Inhaler  12  . pantoprazole (PROTONIX) 40 MG tablet Take 1 tablet (40 mg total) by mouth 2 (two) times daily before a meal. 30-60 minutes before breakfast and supper  180 tablet  3  . Probiotic Product (MISC INTESTINAL FLORA REGULAT) CHEW Digestive Health Probiotics by Schiff    0  . Probiotic Product (PROBIOTIC FORMULA PO) Take 1 capsule by mouth daily.      . sucralfate (CARAFATE) 1 GM/10ML suspension Take 10 mLs (1 g total) by mouth 4 (four) times  daily -  with meals and at bedtime. As needed for pain w ith swallowing  420 mL  0  . vitamin B-12 (CYANOCOBALAMIN) 1000 MCG tablet Take 1,000 mcg by mouth daily.          Allergies  Allergen Reactions  . Codeine   . Diphenhydramine Hcl   . Morphine   . Peanut-Containing Drug Products   . Zofran Other (See Comments)    HEADACHE    Review of Systems  Review of Systems  Constitutional: Negative for fever, chills and malaise/fatigue.  HENT: Negative for hearing loss, nosebleeds and congestion.   Eyes: Negative for discharge.  Respiratory: Negative for cough, sputum production, shortness of breath and wheezing.   Cardiovascular: Negative for chest pain, palpitations and leg swelling.  Gastrointestinal: Negative for heartburn, nausea, vomiting, abdominal pain, diarrhea, constipation and blood in stool.  Genitourinary: Negative for dysuria, urgency, frequency and hematuria.  Musculoskeletal: Positive for joint pain. Negative for myalgias, back pain and falls.  Skin: Negative for rash.  Neurological: Negative for dizziness, tremors, sensory change, focal weakness, loss of consciousness, weakness and headaches.  Endo/Heme/Allergies: Negative for polydipsia. Does not bruise/bleed easily.  Psychiatric/Behavioral: Positive for depression and substance abuse. Negative for suicidal ideas. The patient is nervous/anxious and has insomnia.     Objective  BP 115/79  Pulse 84  Temp 97.6 F (36.4 C) (Temporal)  Ht 5\' 6"  (1.676 m)  Wt 130 lb 1.9 oz (59.022 kg)  BMI 21.00 kg/m2  SpO2 99%  Physical Exam  Physical Exam  Constitutional: She is oriented to person, place, and time and well-developed, well-nourished, and in no distress. No distress.  HENT:  Head: Normocephalic and atraumatic.  Right Ear: External ear normal.  Left Ear: External ear normal.  Nose: Nose normal.  Mouth/Throat: Oropharynx is clear and moist. No oropharyngeal exudate.  Eyes: Conjunctivae normal are normal. Pupils  are equal, round, and reactive to light. Right eye exhibits no discharge. Left eye exhibits no discharge. No scleral icterus.  Neck: Normal range of motion. Neck supple. No thyromegaly present.  Cardiovascular: Normal rate, regular rhythm, normal heart sounds and intact distal pulses.   No murmur heard. Pulmonary/Chest: Effort normal and breath sounds normal. No respiratory distress. She has no wheezes. She has no rales.  Abdominal: Soft. Bowel sounds are normal. She exhibits no distension and no mass. There is no tenderness.  Musculoskeletal: Normal range of motion. She exhibits no edema and no tenderness.  Lymphadenopathy:    She has no cervical adenopathy.  Neurological: She is alert and oriented to person, place, and time. She has normal reflexes. No cranial nerve deficit. Coordination normal.  Skin: Skin is warm and dry. No rash noted. She is not diaphoretic.  Psychiatric: Mood, memory and affect normal.    Lab Results  Component Value Date   TSH 3.51 12/21/2011   Lab Results  Component Value Date   WBC 4.2 04/29/2012   HGB 11.7 04/29/2012   HCT 34.9 04/29/2012   MCV 93.7 04/29/2012   PLT 232 04/29/2012   Lab Results  Component Value Date   CREATININE 0.72 04/29/2012   BUN 18 04/29/2012   NA 141 04/29/2012   K 3.6 04/29/2012   CL 104 04/29/2012   CO2 30 04/29/2012   Lab Results  Component Value Date   ALT 22 04/29/2012   AST 19 04/29/2012   ALKPHOS 104 04/29/2012   BILITOT 0.4 04/29/2012   Lab Results  Component Value Date   CHOL 188 12/21/2011   Lab Results  Component Value Date   HDL 79.80 12/21/2011   Lab Results  Component Value Date   LDLCALC 85 12/21/2011   Lab Results  Component Value Date   TRIG 116.0 12/21/2011   Lab Results  Component  Value Date   CHOLHDL 2 12/21/2011     Assessment & Plan  Preventative health care Walking 2+ miles a day. Has chosen to not fully stop drinking alcohol but is down to 2 glasses of wine a day (4 oz)  Daily. Is eating well and  sleeping well. Generally feels week. Given flu shot today.   Alcohol abuse Has cut down to 2 glasses of wine a day  Anemia Mild, repeat a cbc today  PURE HYPERCHOLESTEROLEMIA Tolerating Atorvastatin daily, avoid trans fats, increase exercise  GERD Doing much better in this regard without any dysphagia secondary to her new inhaled steroids, will continue the same and she will avoid offending foods  Baker's cyst of knee Good response to increased walking and Aspercreme prn  Anxiety and depression Improved with Amitriptyline despite her rocky marriage, uses Lorazepam more for sleep than anything else but can use it during the day as needed with good results.  Tachycardia Improved on recheck today

## 2012-10-21 NOTE — Assessment & Plan Note (Signed)
Mild, repeat a cbc today

## 2012-10-21 NOTE — Assessment & Plan Note (Signed)
Good response to increased walking and Aspercreme prn

## 2012-10-21 NOTE — Assessment & Plan Note (Signed)
Improved with Amitriptyline despite her rocky marriage, uses Lorazepam more for sleep than anything else but can use it during the day as needed with good results.

## 2012-10-21 NOTE — Assessment & Plan Note (Signed)
Improved on recheck today 

## 2012-10-21 NOTE — Patient Instructions (Addendum)
Preventive Care for Adults, Female A healthy lifestyle and preventive care can promote health and wellness. Preventive health guidelines for women include the following key practices.  A routine yearly physical is a good way to check with your caregiver about your health and preventive screening. It is a chance to share any concerns and updates on your health, and to receive a thorough exam.  Visit your dentist for a routine exam and preventive care every 6 months. Brush your teeth twice a day and floss once a day. Good oral hygiene prevents tooth decay and gum disease.  The frequency of eye exams is based on your age, health, family medical history, use of contact lenses, and other factors. Follow your caregiver's recommendations for frequency of eye exams.  Eat a healthy diet. Foods like vegetables, fruits, whole grains, low-fat dairy products, and lean protein foods contain the nutrients you need without too many calories. Decrease your intake of foods high in solid fats, added sugars, and salt. Eat the right amount of calories for you.Get information about a proper diet from your caregiver, if necessary.  Regular physical exercise is one of the most important things you can do for your health. Most adults should get at least 150 minutes of moderate-intensity exercise (any activity that increases your heart rate and causes you to sweat) each week. In addition, most adults need muscle-strengthening exercises on 2 or more days a week.  Maintain a healthy weight. The body mass index (BMI) is a screening tool to identify possible weight problems. It provides an estimate of body fat based on height and weight. Your caregiver can help determine your BMI, and can help you achieve or maintain a healthy weight.For adults 20 years and older:  A BMI below 18.5 is considered underweight.  A BMI of 18.5 to 24.9 is normal.  A BMI of 25 to 29.9 is considered overweight.  A BMI of 30 and above is  considered obese.  Maintain normal blood lipids and cholesterol levels by exercising and minimizing your intake of saturated fat. Eat a balanced diet with plenty of fruit and vegetables. Blood tests for lipids and cholesterol should begin at age 20 and be repeated every 5 years. If your lipid or cholesterol levels are high, you are over 50, or you are at high risk for heart disease, you may need your cholesterol levels checked more frequently.Ongoing high lipid and cholesterol levels should be treated with medicines if diet and exercise are not effective.  If you smoke, find out from your caregiver how to quit. If you do not use tobacco, do not start.  If you are pregnant, do not drink alcohol. If you are breastfeeding, be very cautious about drinking alcohol. If you are not pregnant and choose to drink alcohol, do not exceed 1 drink per day. One drink is considered to be 12 ounces (355 mL) of beer, 5 ounces (148 mL) of wine, or 1.5 ounces (44 mL) of liquor.  Avoid use of street drugs. Do not share needles with anyone. Ask for help if you need support or instructions about stopping the use of drugs.  High blood pressure causes heart disease and increases the risk of stroke. Your blood pressure should be checked at least every 1 to 2 years. Ongoing high blood pressure should be treated with medicines if weight loss and exercise are not effective.  If you are 55 to 70 years old, ask your caregiver if you should take aspirin to prevent strokes.  Diabetes   screening involves taking a blood sample to check your fasting blood sugar level. This should be done once every 3 years, after age 45, if you are within normal weight and without risk factors for diabetes. Testing should be considered at a younger age or be carried out more frequently if you are overweight and have at least 1 risk factor for diabetes.  Breast cancer screening is essential preventive care for women. You should practice "breast  self-awareness." This means understanding the normal appearance and feel of your breasts and may include breast self-examination. Any changes detected, no matter how small, should be reported to a caregiver. Women in their 20s and 30s should have a clinical breast exam (CBE) by a caregiver as part of a regular health exam every 1 to 3 years. After age 40, women should have a CBE every year. Starting at age 40, women should consider having a mammography (breast X-ray test) every year. Women who have a family history of breast cancer should talk to their caregiver about genetic screening. Women at a high risk of breast cancer should talk to their caregivers about having magnetic resonance imaging (MRI) and a mammography every year.  The Pap test is a screening test for cervical cancer. A Pap test can show cell changes on the cervix that might become cervical cancer if left untreated. A Pap test is a procedure in which cells are obtained and examined from the lower end of the uterus (cervix).  Women should have a Pap test starting at age 21.  Between ages 21 and 29, Pap tests should be repeated every 2 years.  Beginning at age 30, you should have a Pap test every 3 years as long as the past 3 Pap tests have been normal.  Some women have medical problems that increase the chance of getting cervical cancer. Talk to your caregiver about these problems. It is especially important to talk to your caregiver if a new problem develops soon after your last Pap test. In these cases, your caregiver may recommend more frequent screening and Pap tests.  The above recommendations are the same for women who have or have not gotten the vaccine for human papillomavirus (HPV).  If you had a hysterectomy for a problem that was not cancer or a condition that could lead to cancer, then you no longer need Pap tests. Even if you no longer need a Pap test, a regular exam is a good idea to make sure no other problems are  starting.  If you are between ages 65 and 70, and you have had normal Pap tests going back 10 years, you no longer need Pap tests. Even if you no longer need a Pap test, a regular exam is a good idea to make sure no other problems are starting.  If you have had past treatment for cervical cancer or a condition that could lead to cancer, you need Pap tests and screening for cancer for at least 20 years after your treatment.  If Pap tests have been discontinued, risk factors (such as a new sexual partner) need to be reassessed to determine if screening should be resumed.  The HPV test is an additional test that may be used for cervical cancer screening. The HPV test looks for the virus that can cause the cell changes on the cervix. The cells collected during the Pap test can be tested for HPV. The HPV test could be used to screen women aged 30 years and older, and should   be used in women of any age who have unclear Pap test results. After the age of 30, women should have HPV testing at the same frequency as a Pap test.  Colorectal cancer can be detected and often prevented. Most routine colorectal cancer screening begins at the age of 50 and continues through age 75. However, your caregiver may recommend screening at an earlier age if you have risk factors for colon cancer. On a yearly basis, your caregiver may provide home test kits to check for hidden blood in the stool. Use of a small camera at the end of a tube, to directly examine the colon (sigmoidoscopy or colonoscopy), can detect the earliest forms of colorectal cancer. Talk to your caregiver about this at age 50, when routine screening begins. Direct examination of the colon should be repeated every 5 to 10 years through age 75, unless early forms of pre-cancerous polyps or small growths are found.  Hepatitis C blood testing is recommended for all people born from 1945 through 1965 and any individual with known risks for hepatitis C.  Practice  safe sex. Use condoms and avoid high-risk sexual practices to reduce the spread of sexually transmitted infections (STIs). STIs include gonorrhea, chlamydia, syphilis, trichomonas, herpes, HPV, and human immunodeficiency virus (HIV). Herpes, HIV, and HPV are viral illnesses that have no cure. They can result in disability, cancer, and death. Sexually active women aged 25 and younger should be checked for chlamydia. Older women with new or multiple partners should also be tested for chlamydia. Testing for other STIs is recommended if you are sexually active and at increased risk.  Osteoporosis is a disease in which the bones lose minerals and strength with aging. This can result in serious bone fractures. The risk of osteoporosis can be identified using a bone density scan. Women ages 65 and over and women at risk for fractures or osteoporosis should discuss screening with their caregivers. Ask your caregiver whether you should take a calcium supplement or vitamin D to reduce the rate of osteoporosis.  Menopause can be associated with physical symptoms and risks. Hormone replacement therapy is available to decrease symptoms and risks. You should talk to your caregiver about whether hormone replacement therapy is right for you.  Use sunscreen with sun protection factor (SPF) of 30 or more. Apply sunscreen liberally and repeatedly throughout the day. You should seek shade when your shadow is shorter than you. Protect yourself by wearing long sleeves, pants, a wide-brimmed hat, and sunglasses year round, whenever you are outdoors.  Once a month, do a whole body skin exam, using a mirror to look at the skin on your back. Notify your caregiver of new moles, moles that have irregular borders, moles that are larger than a pencil eraser, or moles that have changed in shape or color.  Stay current with required immunizations.  Influenza. You need a dose every fall (or winter). The composition of the flu vaccine  changes each year, so being vaccinated once is not enough.  Pneumococcal polysaccharide. You need 1 to 2 doses if you smoke cigarettes or if you have certain chronic medical conditions. You need 1 dose at age 65 (or older) if you have never been vaccinated.  Tetanus, diphtheria, pertussis (Tdap, Td). Get 1 dose of Tdap vaccine if you are younger than age 65, are over 65 and have contact with an infant, are a healthcare worker, are pregnant, or simply want to be protected from whooping cough. After that, you need a Td   booster dose every 10 years. Consult your caregiver if you have not had at least 3 tetanus and diphtheria-containing shots sometime in your life or have a deep or dirty wound.  HPV. You need this vaccine if you are a woman age 26 or younger. The vaccine is given in 3 doses over 6 months.  Measles, mumps, rubella (MMR). You need at least 1 dose of MMR if you were born in 1957 or later. You may also need a second dose.  Meningococcal. If you are age 19 to 21 and a first-year college student living in a residence hall, or have one of several medical conditions, you need to get vaccinated against meningococcal disease. You may also need additional booster doses.  Zoster (shingles). If you are age 60 or older, you should get this vaccine.  Varicella (chickenpox). If you have never had chickenpox or you were vaccinated but received only 1 dose, talk to your caregiver to find out if you need this vaccine.  Hepatitis A. You need this vaccine if you have a specific risk factor for hepatitis A virus infection or you simply wish to be protected from this disease. The vaccine is usually given as 2 doses, 6 to 18 months apart.  Hepatitis B. You need this vaccine if you have a specific risk factor for hepatitis B virus infection or you simply wish to be protected from this disease. The vaccine is given in 3 doses, usually over 6 months. Preventive Services / Frequency Ages 19 to 39  Blood  pressure check.** / Every 1 to 2 years.  Lipid and cholesterol check.** / Every 5 years beginning at age 20.  Clinical breast exam.** / Every 3 years for women in their 20s and 30s.  Pap test.** / Every 2 years from ages 21 through 29. Every 3 years starting at age 30 through age 65 or 70 with a history of 3 consecutive normal Pap tests.  HPV screening.** / Every 3 years from ages 30 through ages 65 to 70 with a history of 3 consecutive normal Pap tests.  Hepatitis C blood test.** / For any individual with known risks for hepatitis C.  Skin self-exam. / Monthly.  Influenza immunization.** / Every year.  Pneumococcal polysaccharide immunization.** / 1 to 2 doses if you smoke cigarettes or if you have certain chronic medical conditions.  Tetanus, diphtheria, pertussis (Tdap, Td) immunization. / A one-time dose of Tdap vaccine. After that, you need a Td booster dose every 10 years.  HPV immunization. / 3 doses over 6 months, if you are 26 and younger.  Measles, mumps, rubella (MMR) immunization. / You need at least 1 dose of MMR if you were born in 1957 or later. You may also need a second dose.  Meningococcal immunization. / 1 dose if you are age 19 to 21 and a first-year college student living in a residence hall, or have one of several medical conditions, you need to get vaccinated against meningococcal disease. You may also need additional booster doses.  Varicella immunization.** / Consult your caregiver.  Hepatitis A immunization.** / Consult your caregiver. 2 doses, 6 to 18 months apart.  Hepatitis B immunization.** / Consult your caregiver. 3 doses usually over 6 months. Ages 40 to 64  Blood pressure check.** / Every 1 to 2 years.  Lipid and cholesterol check.** / Every 5 years beginning at age 20.  Clinical breast exam.** / Every year after age 40.  Mammogram.** / Every year beginning at age 40   and continuing for as long as you are in good health. Consult with your  caregiver.  Pap test.** / Every 3 years starting at age 30 through age 65 or 70 with a history of 3 consecutive normal Pap tests.  HPV screening.** / Every 3 years from ages 30 through ages 65 to 70 with a history of 3 consecutive normal Pap tests.  Fecal occult blood test (FOBT) of stool. / Every year beginning at age 50 and continuing until age 75. You may not need to do this test if you get a colonoscopy every 10 years.  Flexible sigmoidoscopy or colonoscopy.** / Every 5 years for a flexible sigmoidoscopy or every 10 years for a colonoscopy beginning at age 50 and continuing until age 75.  Hepatitis C blood test.** / For all people born from 1945 through 1965 and any individual with known risks for hepatitis C.  Skin self-exam. / Monthly.  Influenza immunization.** / Every year.  Pneumococcal polysaccharide immunization.** / 1 to 2 doses if you smoke cigarettes or if you have certain chronic medical conditions.  Tetanus, diphtheria, pertussis (Tdap, Td) immunization.** / A one-time dose of Tdap vaccine. After that, you need a Td booster dose every 10 years.  Measles, mumps, rubella (MMR) immunization. / You need at least 1 dose of MMR if you were born in 1957 or later. You may also need a second dose.  Varicella immunization.** / Consult your caregiver.  Meningococcal immunization.** / Consult your caregiver.  Hepatitis A immunization.** / Consult your caregiver. 2 doses, 6 to 18 months apart.  Hepatitis B immunization.** / Consult your caregiver. 3 doses, usually over 6 months. Ages 65 and over  Blood pressure check.** / Every 1 to 2 years.  Lipid and cholesterol check.** / Every 5 years beginning at age 20.  Clinical breast exam.** / Every year after age 40.  Mammogram.** / Every year beginning at age 40 and continuing for as long as you are in good health. Consult with your caregiver.  Pap test.** / Every 3 years starting at age 30 through age 65 or 70 with a 3  consecutive normal Pap tests. Testing can be stopped between 65 and 70 with 3 consecutive normal Pap tests and no abnormal Pap or HPV tests in the past 10 years.  HPV screening.** / Every 3 years from ages 30 through ages 65 or 70 with a history of 3 consecutive normal Pap tests. Testing can be stopped between 65 and 70 with 3 consecutive normal Pap tests and no abnormal Pap or HPV tests in the past 10 years.  Fecal occult blood test (FOBT) of stool. / Every year beginning at age 50 and continuing until age 75. You may not need to do this test if you get a colonoscopy every 10 years.  Flexible sigmoidoscopy or colonoscopy.** / Every 5 years for a flexible sigmoidoscopy or every 10 years for a colonoscopy beginning at age 50 and continuing until age 75.  Hepatitis C blood test.** / For all people born from 1945 through 1965 and any individual with known risks for hepatitis C.  Osteoporosis screening.** / A one-time screening for women ages 65 and over and women at risk for fractures or osteoporosis.  Skin self-exam. / Monthly.  Influenza immunization.** / Every year.  Pneumococcal polysaccharide immunization.** / 1 dose at age 65 (or older) if you have never been vaccinated.  Tetanus, diphtheria, pertussis (Tdap, Td) immunization. / A one-time dose of Tdap vaccine if you are over   65 and have contact with an infant, are a healthcare worker, or simply want to be protected from whooping cough. After that, you need a Td booster dose every 10 years.  Varicella immunization.** / Consult your caregiver.  Meningococcal immunization.** / Consult your caregiver.  Hepatitis A immunization.** / Consult your caregiver. 2 doses, 6 to 18 months apart.  Hepatitis B immunization.** / Check with your caregiver. 3 doses, usually over 6 months. ** Family history and personal history of risk and conditions may change your caregiver's recommendations. Document Released: 12/25/2001 Document Revised: 01/21/2012  Document Reviewed: 03/26/2011 [employer] Patient Information 2013 [employer].  

## 2012-10-21 NOTE — Assessment & Plan Note (Signed)
Tolerating Atorvastatin daily, avoid trans fats, increase exercise

## 2012-10-30 ENCOUNTER — Telehealth: Payer: Self-pay | Admitting: *Deleted

## 2012-10-30 NOTE — Telephone Encounter (Signed)
PT. COULD NOT REMEMBER THE NAME OF THE PHYSICIAN DR.LIVESAY RECOMMENDED. SHE WAS ALSO UNSURE WHEN THE DISCUSSION OCCURRED. THIS NOTE TO DR.LIVESAY'S DESK.

## 2012-11-09 ENCOUNTER — Telehealth: Payer: Self-pay | Admitting: Gastroenterology

## 2012-11-09 NOTE — Telephone Encounter (Signed)
The patient's husband called stating that his wife swallowed a pill that become stuck. She denied chest pain or odynophagia. She was instructed to try warm water for several hours. Should symptoms not improved she was instructed to call the answering service at which point she would be instructed to come to endoscopy for endoscopy for possible pill impaction.

## 2012-11-10 ENCOUNTER — Telehealth: Payer: Self-pay

## 2012-11-10 NOTE — Telephone Encounter (Signed)
I have left a message for the patient to call back to give an update on her condition.

## 2012-11-10 NOTE — Telephone Encounter (Signed)
Please follow-up Sounds like she may need an egd/dili

## 2012-11-11 ENCOUNTER — Encounter: Payer: Self-pay | Admitting: Family Medicine

## 2012-11-11 ENCOUNTER — Ambulatory Visit (INDEPENDENT_AMBULATORY_CARE_PROVIDER_SITE_OTHER): Payer: Medicare Other | Admitting: Family Medicine

## 2012-11-11 VITALS — BP 90/56 | HR 97 | Temp 97.8°F | Ht 66.0 in | Wt 130.0 lb

## 2012-11-11 DIAGNOSIS — J069 Acute upper respiratory infection, unspecified: Secondary | ICD-10-CM

## 2012-11-11 DIAGNOSIS — J209 Acute bronchitis, unspecified: Secondary | ICD-10-CM

## 2012-11-11 MED ORDER — ALBUTEROL SULFATE HFA 108 (90 BASE) MCG/ACT IN AERS: INHALATION_SPRAY | RESPIRATORY_TRACT | Status: DC

## 2012-11-11 NOTE — Telephone Encounter (Signed)
Left message for patient to call back  

## 2012-11-11 NOTE — Progress Notes (Signed)
OFFICE NOTE  11/11/2012  CC:  Chief Complaint  Patient presents with  . Cough    x 2-3 days, productive; head/chest congestion; HA, unsure fever     HPI: Patient is a 71 y.o. Caucasian female who is here for congestion.   Pt presents complaining of respiratory symptoms for 3-4 days.  Primary symptoms are: nasal cong/runny nose, cough.  ST initially but gone now.  Subjective f/c.  Worst symptoms seems to be the cough.  Lately the symptoms seem to be worsening. Pertinent negatives: No fevers, no wheezing, and no SOB.  No pain in face or teeth.  No significant HA.  ST mild at most.   Symptoms made worse by night.  Symptoms improved by robitussin and claritin. Smoker? Former, quit 2008. Recent sick contact? yes Muscle or joint aches? mild Flu shot this season at least 2 wks ago? Yes, about  Additional ROS: no n/v/d or abdominal pain.  No rash.  No neck stiffness.   +Mild fatigue.  +Mild appetite loss.  Pertinent PMH:  Past Medical History  Diagnosis Date  . Anemia   . COPD (chronic obstructive pulmonary disease)   . Hyperlipidemia   . Pneumonia 2-10    pleurisy  . Arthritis   . Depression   . GERD (gastroesophageal reflux disease) 09/29/2009    improved s/p cholecystectomy and esophagus dilatation  . History of chicken pox   . History of shingles     2 episodes  . History of measles   . Osteopenia 03/14/2011  . 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 Pure hypercholesterolemia 10/17/2010  . 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 ESOPHAGEAL STRICTURE 03/29/2009  . CAROTID ARTERY STENOSIS 01/13/2010  . ALKALINE PHOSPHATASE, ELEVATED 03/15/2009  . Anxiety and depression 04/28/2011  . Vaginitis 05/22/2011  . Baker's cyst of knee 05/22/2011  . Trauma 10/18/2011  . Radial neck fracture 10/2011    minimally displaced  . Atypical chest pain 11/30/2011  . Folliculitis of nose 12/31/2011  . Esophageal ring   . 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 Urinary incontinence 03/19/2012  . Allergic state 06/10/2012  . Hematuria   . Dysphagia   . URI (upper respiratory infection) 09/02/2012  . Preventative health care 10/21/2012    MEDS:  Outpatient Prescriptions Prior to Visit  Medication Sig Dispense Refill  . amitriptyline (ELAVIL) 150 MG tablet take 1 tablet by mouth at bedtime  30 tablet  1  . anastrozole (ARIMIDEX) 1 MG tablet Take 1 tablet (1 mg total) by mouth daily.  90 tablet  1  . aspirin 81 MG tablet Take 81 mg by mouth daily.        [name] atorvastatin (LIPITOR) 40 MG tablet Take 1 tablet (40 mg total) by mouth daily.  90 tablet  3  . beta carotene 40981 UNIT capsule Take 25,000 Units by mouth daily.        . calcium carbonate (TUMS - DOSED IN MG ELEMENTAL CALCIUM) 500 MG chewable tablet Chew 750 mg by mouth 2 (two) times daily.       . Cholecalciferol (VITAMIN D3) 1000 UNITS CAPS Take 2 tablets by mouth daily.        [name] FERRETTS 325 (106 FE) MG TABS take 1 tablet by mouth once daily  30 tablet  4  . fluconazole (DIFLUCAN) 150 MG tablet Take 1 tablet (150 mg total) by mouth once a week.  2  tablet  1  . fluticasone (FLOVENT HFA) 220 MCG/ACT inhaler Inhale 2 puffs into the lungs 2 (two) times daily. swallow  1 Inhaler  12  . LORazepam (ATIVAN) 1 MG tablet Take 1 tablet (1 mg total) by mouth every 6 (six) hours as needed for anxiety (x 10 days & then drop to tid x 7 days then back to bid).  90 tablet  1  . pantoprazole (PROTONIX) 40 MG tablet Take 1 tablet (40 mg total) by mouth 2 (two) times daily before a meal. 30-60 minutes before breakfast and supper  180 tablet  3  . Probiotic Product (MISC INTESTINAL FLORA REGULAT) CHEW Digestive Health Probiotics by Schiff    0  . Probiotic Product (PROBIOTIC FORMULA PO) Take 1 capsule by mouth daily.      . sucralfate (CARAFATE) 1 GM/10ML suspension Take 10 mLs (1 g total) by mouth 4 (four) times daily -  with meals and at bedtime. As needed for pain w ith swallowing  420 mL  0  . vitamin B-12  (CYANOCOBALAMIN) 1000 MCG tablet Take 1,000 mcg by mouth daily.         Last reviewed on 11/11/2012 10:16 AM by Jeoffrey Massed, MD  PE: Blood pressure 90/56, pulse 97, temperature 97.8 F (36.6 C), temperature source Temporal, height 5\' 6"  (1.676 m), weight 130 lb (58.968 kg), SpO2 98.00%. VS: noted--normal. Gen: alert, NAD, well- APPEARING. HEENT: eyes without injection, drainage, or swelling.  Ears: EACs clear, TMs with normal light reflex and landmarks.  Nose: Clear rhinorrhea, with some dried, crusty exudate adherent to mildly injected mucosa.  No purulent d/c.  No paranasal sinus TTP.  No facial swelling.  Throat and mouth without focal lesion.  No pharyngial swelling, erythema, or exudate.   Neck: supple, no LAD.   LUNGS: CTA bilat, nonlabored resps.  No prolongation of exp phase, no significant post-exhalation coughing with forced expiratory maneuver. CV: RRR, no m/r/g. EXT: no c/c/e SKIN: no rash  LAB: none today  IMPRESSION AND PLAN:  Acute bronchitis Acute viral URI with acute bronchitis. No obvious RAD but I think it is worthwhile for her to try using an albuterol inhaler q4h prn--ProAir HFA rx'd today. Discussed other routine symptomatic care and warning signs of worsening illness/what to call or return for.  An After Visit Summary was printed and given to the patient.  FOLLOW UP: prn

## 2012-11-12 NOTE — Assessment & Plan Note (Signed)
Acute viral URI with acute bronchitis. No obvious RAD but I think it is worthwhile for her to try using an albuterol inhaler q4h prn--ProAir HFA rx'd today. Discussed other routine symptomatic care and warning signs of worsening illness/what to call or return for.

## 2012-11-13 NOTE — Telephone Encounter (Signed)
I spoke with the patient and she feels that she does not need an appt at this time.  She will call back if she is interested.   She is still taking

## 2012-11-13 NOTE — Telephone Encounter (Signed)
Patient is taking inhaled Fluticasone for bronchitis, she never swallowed the Fluticasone as you had originally prescribed she inhaled it.  She is having difficultly swallowing but refuses an appt.

## 2012-11-13 NOTE — Telephone Encounter (Signed)
No return call from the patient.  I have left her a message to call us if having difficulty swallowing so we can schedule an appt.

## 2012-11-17 ENCOUNTER — Telehealth: Payer: Self-pay

## 2012-11-17 NOTE — Telephone Encounter (Signed)
Patient sent a message via Contact us on 11/15/12.  I have left her a message to call back to discuss dysphagia

## 2012-11-18 NOTE — Telephone Encounter (Signed)
I have left a message for patient on her home and attempted to reach her by cell.

## 2012-11-18 NOTE — Telephone Encounter (Signed)
Patient reports that she needs the procedure at lease 2 weeks out due to her husbands work schedule.  See phone note from 11/09/12 and 11/10/12.  I have scheduled the appt for 12/12/12 and pre-visit for 12/03/12 2:00.  She wants Dr. Leone Payor to know that the large amount of peppermint is most likely the cause of her dysphagia, but she will come for procedure on 12/12/12

## 2012-11-21 ENCOUNTER — Encounter: Payer: Self-pay | Admitting: Family Medicine

## 2012-11-21 ENCOUNTER — Ambulatory Visit (INDEPENDENT_AMBULATORY_CARE_PROVIDER_SITE_OTHER): Payer: Medicare Other | Admitting: Family Medicine

## 2012-11-21 VITALS — BP 99/64 | HR 100 | Temp 97.6°F | Ht 66.0 in | Wt 131.0 lb

## 2012-11-21 DIAGNOSIS — L259 Unspecified contact dermatitis, unspecified cause: Secondary | ICD-10-CM

## 2012-11-21 DIAGNOSIS — L309 Dermatitis, unspecified: Secondary | ICD-10-CM

## 2012-11-21 DIAGNOSIS — W19XXXA Unspecified fall, initial encounter: Secondary | ICD-10-CM

## 2012-11-21 MED ORDER — TRIAMCINOLONE ACETONIDE 0.1 % EX CREA: TOPICAL_CREAM | Freq: Two times a day (BID) | CUTANEOUS | Status: DC

## 2012-11-21 NOTE — Patient Instructions (Addendum)
Apply Aspercreme to affect area twice daily for any discomfort     Dermatitis Contact dermatitis is a reaction to certain substances that touch the skin. Contact dermatitis can be either irritant contact dermatitis or allergic contact dermatitis. Irritant contact dermatitis does not require previous exposure to the substance for a reaction to occur.Allergic contact dermatitis only occurs if you have been exposed to the substance before. Upon a repeat exposure, your body reacts to the substance.  CAUSES  Many substances can cause contact dermatitis. Irritant dermatitis is most commonly caused by repeated exposure to mildly irritating substances, such as:  Makeup.  Soaps.  Detergents.  Bleaches.  Acids.  Metal salts, such as nickel. Allergic contact dermatitis is most commonly caused by exposure to:  Poisonous plants.  Chemicals (deodorants, shampoos).  Jewelry.  Latex.  Neomycin in triple antibiotic cream.  Preservatives in products, including clothing. SYMPTOMS  The area of skin that is exposed may develop:  Dryness or flaking.  Redness.  Cracks.  Itching.  Pain or a burning sensation.  Blisters. With allergic contact dermatitis, there may also be swelling in areas such as the eyelids, mouth, or genitals.  DIAGNOSIS  Your caregiver can usually tell what the problem is by doing a physical exam. In cases where the cause is uncertain and an allergic contact dermatitis is suspected, a patch skin test may be performed to help determine the cause of your dermatitis. TREATMENT Treatment includes protecting the skin from further contact with the irritating substance by avoiding that substance if possible. Barrier creams, powders, and gloves may be helpful. Your caregiver may also recommend:  Steroid creams or ointments applied 2 times daily. For best results, soak the rash area in cool water for 20 minutes. Then apply the medicine. Cover the area with a plastic wrap. You  can store the steroid cream in the refrigerator for a "chilly" effect on your rash. That may decrease itching. Oral steroid medicines may be needed in more severe cases.  Antibiotics or antibacterial ointments if a skin infection is present.  Antihistamine lotion or an antihistamine taken by mouth to ease itching.  Lubricants to keep moisture in your skin.  Burow's solution to reduce redness and soreness or to dry a weeping rash. Mix one packet or tablet of solution in 2 cups cool water. Dip a clean washcloth in the mixture, wring it out a bit, and put it on the affected area. Leave the cloth in place for 30 minutes. Do this as often as possible throughout the day.  Taking several cornstarch or baking soda baths daily if the area is too large to cover with a washcloth. Harsh chemicals, such as alkalis or acids, can cause skin damage that is like a burn. You should flush your skin for 15 to 20 minutes with cold water after such an exposure. You should also seek immediate medical care after exposure. Bandages (dressings), antibiotics, and pain medicine may be needed for severely irritated skin.  HOME CARE INSTRUCTIONS  Avoid the substance that caused your reaction.  Keep the area of skin that is affected away from hot water, soap, sunlight, chemicals, acidic substances, or anything else that would irritate your skin.  Do not scratch the rash. Scratching may cause the rash to become infected.  You may take cool baths to help stop the itching.  Only take over-the-counter or prescription medicines as directed by your caregiver.  See your caregiver for follow-up care as directed to make sure your skin is healing properly.  SEEK MEDICAL CARE IF:   Your condition is not better after 3 days of treatment.  You seem to be getting worse.  You see signs of infection such as swelling, tenderness, redness, soreness, or warmth in the affected area.  You have any problems related to your  medicines. Document Released: 10/26/2000 Document Revised: 01/21/2012 Document Reviewed: 04/03/2011 [facility] Patient Information 2013 [place], [place].

## 2012-11-23 ENCOUNTER — Encounter: Payer: Self-pay | Admitting: Family Medicine

## 2012-11-23 DIAGNOSIS — R296 Repeated falls: Secondary | ICD-10-CM | POA: Insufficient documentation

## 2012-11-23 DIAGNOSIS — W19XXXA Unspecified fall, initial encounter: Secondary | ICD-10-CM

## 2012-11-23 DIAGNOSIS — L309 Dermatitis, unspecified: Secondary | ICD-10-CM

## 2012-11-23 HISTORY — DX: Dermatitis, unspecified: L30.9

## 2012-11-23 HISTORY — DX: Unspecified fall, initial encounter: W19.XXXA

## 2012-11-23 NOTE — Assessment & Plan Note (Signed)
Behind levt ear. Triacmcinolone cream to apply bid, avoid harsh soaps and shampoos

## 2012-11-23 NOTE — Assessment & Plan Note (Signed)
Last week tripped over vacuum cord and hit chin, left arm and hip, no significant pain except behind left ear, try Aspercreme prn

## 2012-11-23 NOTE — Progress Notes (Signed)
Patient ID: Anne [name], female   DOB: 1941-10-24, 72 y.o.   MRN: [id number] Anne [name] [id number] May 13, 1941 11/23/2012      Progress Note-Follow Up  Subjective  Chief Complaint  Chief Complaint  Patient presents with  . spot behind ear    irritated and sore- left ear X 1 week- no itching    HPI  Patient is a 72 year old Caucasian female who is in today with complaints of discomfort behind her left ear. She noted a tender to touch for about the last week behind her ear. She denies any fevers, chills, congestion. No headache. She feels like it was mildly swollen but that is better. She did fall last week tripping over her vacuum cord at her house and landing on her left chin left shoulder left hip. She's been sore since shortly after that. Denies any itching or discharge from behind her in a year. No chest pain palpitations syncope or other complaints noted.  Past Medical History  Diagnosis Date  . Anemia   . COPD (chronic obstructive pulmonary disease)   . Hyperlipidemia   . Pneumonia 2-10    pleurisy  . Arthritis   . Depression   . GERD (gastroesophageal reflux disease) 09/29/2009    improved s/p cholecystectomy and esophagus dilatation  . History of chicken pox   . History of shingles     2 episodes  . History of measles   . Osteopenia 03/14/2011  . 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 Pure hypercholesterolemia 10/17/2010  . 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 ESOPHAGEAL STRICTURE 03/29/2009  . CAROTID ARTERY STENOSIS 01/13/2010  . ALKALINE PHOSPHATASE, ELEVATED 03/15/2009  . Anxiety and depression 04/28/2011  . Vaginitis 05/22/2011  . Baker's cyst of knee 05/22/2011  . Trauma 10/18/2011  . Radial neck fracture 10/2011    minimally displaced  . Atypical chest pain 11/30/2011  . Folliculitis of nose 12/31/2011  . Esophageal ring   . 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 Urinary incontinence 03/19/2012  . Allergic state  06/10/2012  . Hematuria   . Dysphagia   . URI (upper respiratory infection) 09/02/2012  . Preventative health care 10/21/2012  . Dermatitis 11/23/2012  . Fall 11/23/2012    Past Surgical History  Procedure Date  . Anterior cruciate ligament repair 08, 09, 10  . Ercp, cbd stone extraction 2010  . Cholecystectomy 2010  . Egd/ dili 2010, 2012  . Breast surgery 2001    lumpectomy (01)  . Colonoscopy 09/05/10    cecal avm's  . Esophagogastroduodenoscopy 01/08/2012    Procedure: ESOPHAGOGASTRODUODENOSCOPY (EGD);  Surgeon: [name], MD;  Location: [facility];  Service: Endoscopy;  Laterality: N/A;  . Savory dilation 01/08/2012    Procedure: SAVORY DILATION;  Surgeon: [name], MD;  Location: [facility];  Service: Endoscopy;  Laterality: N/A;  need xray  . Mastectomy modified radical     , Mastectomy modified radical (08), breast reconstruction, CA lesions excised lateral abd wall 2010  . Breast reconstructive surgery 2008, 2009, 2010    Family History  Problem Relation Age of Onset  . Heart disease Father   . Lung cancer Father     smoker  . Cirrhosis Sister     Primary Biliary  . Breast cancer    . Stroke Maternal Grandmother   . Alcohol abuse Maternal Grandfather   . Heart disease Paternal Grandfather   . Anxiety  disorder Sister   . Osteoporosis Sister   . Arthritis Sister     Rheumatoid  . Skin cancer Sister   . Osteoporosis Sister   . Cancer Sister     multiple skin cancers, over 90 excisions.  . Other Mother     tic deleauroux  . Anesthesia problems Neg Hx   . Hypotension Neg Hx   . Malignant hyperthermia Neg Hx   . Pseudochol deficiency Neg Hx   . Colon cancer Neg Hx   . Ovarian cancer    . Pancreatic cancer      History   Social History  . Marital Status: Married    Spouse Name: N/A    Number of Children: N/A  . Years of Education: N/A   Occupational History  . Not on file.   Social History Main Topics  . Smoking status: Former Smoker      Types: Cigarettes    Quit date: 05/22/2007  . Smokeless tobacco: Never Used  . Alcohol Use: 0.6 oz/week    1 Glasses of wine per week     Comment: 6 oz daily  . Drug Use: No  . Sexually Active: Yes -- Female partner(s)   Other Topics Concern  . Not on file   Social History Narrative  . No narrative on file    Current Outpatient Prescriptions on File Prior to Visit  Medication Sig Dispense Refill  . albuterol (PROAIR HFA) 108 (90 BASE) MCG/ACT inhaler 1-2 puffs q4h prn dry cough, chest tightness, or wheezing  1 Inhaler  0  . amitriptyline (ELAVIL) 150 MG tablet take 1 tablet by mouth at bedtime  30 tablet  1  . anastrozole (ARIMIDEX) 1 MG tablet Take 1 tablet (1 mg total) by mouth daily.  90 tablet  1  . aspirin 81 MG tablet Take 81 mg by mouth daily.        [name] atorvastatin (LIPITOR) 40 MG tablet Take 1 tablet (40 mg total) by mouth daily.  90 tablet  3  . beta carotene 16109 UNIT capsule Take 25,000 Units by mouth daily.        . calcium carbonate (TUMS - DOSED IN MG ELEMENTAL CALCIUM) 500 MG chewable tablet Chew 750 mg by mouth 2 (two) times daily.       . Cholecalciferol (VITAMIN D3) 1000 UNITS CAPS Take 2 tablets by mouth daily.        [name] FERRETTS 325 (106 FE) MG TABS take 1 tablet by mouth once daily  30 tablet  4  . fluconazole (DIFLUCAN) 150 MG tablet Take 1 tablet (150 mg total) by mouth once a week.  2 tablet  1  . fluticasone (FLOVENT HFA) 220 MCG/ACT inhaler Inhale 2 puffs into the lungs 2 (two) times daily. swallow  1 Inhaler  12  . LORazepam (ATIVAN) 1 MG tablet Take 1 tablet (1 mg total) by mouth every 6 (six) hours as needed for anxiety (x 10 days & then drop to tid x 7 days then back to bid).  90 tablet  1  . pantoprazole (PROTONIX) 40 MG tablet Take 1 tablet (40 mg total) by mouth 2 (two) times daily before a meal. 30-60 minutes before breakfast and supper  180 tablet  3  . Probiotic Product (MISC INTESTINAL FLORA REGULAT) CHEW Digestive Health Probiotics by Schiff    0   . Probiotic Product (PROBIOTIC FORMULA PO) Take 1 capsule by mouth daily.      . sucralfate (CARAFATE) 1 GM/10ML  suspension Take 10 mLs (1 g total) by mouth 4 (four) times daily -  with meals and at bedtime. As needed for pain w ith swallowing  420 mL  0  . vitamin B-12 (CYANOCOBALAMIN) 1000 MCG tablet Take 1,000 mcg by mouth daily.          Allergies  Allergen Reactions  . Codeine   . Diphenhydramine Hcl   . Morphine   . Peanut-Containing Drug Products   . Zofran Other (See Comments)    HEADACHE    Review of Systems  Review of Systems  Constitutional: Negative for fever and malaise/fatigue.  HENT: Negative for congestion.   Eyes: Negative for discharge.  Respiratory: Negative for shortness of breath.   Cardiovascular: Negative for chest pain, palpitations and leg swelling.  Gastrointestinal: Negative for nausea, abdominal pain and diarrhea.  Genitourinary: Negative for dysuria.  Musculoskeletal: Negative for falls.  Skin: Positive for rash.       Bruise on chin with yellowing. Scaly patch behind left ear  Neurological: Negative for loss of consciousness and headaches.  Endo/Heme/Allergies: Negative for polydipsia.  Psychiatric/Behavioral: Negative for depression and suicidal ideas. The patient is not nervous/anxious and does not have insomnia.     Objective  BP 99/64  Pulse 100  Temp 97.6 F (36.4 C) (Temporal)  Ht 5\' 6"  (1.676 m)  Wt 131 lb (59.421 kg)  BMI 21.14 kg/m2  SpO2 95%  Physical Exam  Physical Exam  Constitutional: She is oriented to person, place, and time and well-developed, well-nourished, and in no distress. No distress.  HENT:  Head: Normocephalic and atraumatic.  Eyes: Conjunctivae normal are normal.  Neck: Neck supple. No thyromegaly present.  Cardiovascular: Normal rate, regular rhythm and normal heart sounds.   No murmur heard. Pulmonary/Chest: Effort normal and breath sounds normal. She has no wheezes.  Abdominal: She exhibits no  distension and no mass.  Musculoskeletal: She exhibits no edema.  Lymphadenopathy:    She has no cervical adenopathy.  Neurological: She is alert and oriented to person, place, and time.  Skin: Skin is warm and dry. Rash noted. She is not diaphoretic.       Small, scaly rash with erythema at base behind left ear  Psychiatric: Memory, affect and judgment normal.    Lab Results  Component Value Date   TSH 2.56 10/21/2012   Lab Results  Component Value Date   WBC 5.8 10/21/2012   HGB 11.9* 10/21/2012   HCT 36.8 10/21/2012   MCV 95.4 10/21/2012   PLT 267.0 10/21/2012   Lab Results  Component Value Date   CREATININE 0.7 10/21/2012   BUN 13 10/21/2012   NA 137 10/21/2012   K 4.2 10/21/2012   CL 101 10/21/2012   CO2 31 10/21/2012   Lab Results  Component Value Date   ALT 23 10/21/2012   AST 22 10/21/2012   ALKPHOS 107 10/21/2012   BILITOT 0.6 10/21/2012   Lab Results  Component Value Date   CHOL 191 10/21/2012   Lab Results  Component Value Date   HDL 74.70 10/21/2012   Lab Results  Component Value Date   LDLCALC 89 10/21/2012   Lab Results  Component Value Date   TRIG 136.0 10/21/2012   Lab Results  Component Value Date   CHOLHDL 3 10/21/2012     Assessment & Plan  Dermatitis Behind levt ear. Triacmcinolone cream to apply bid, avoid harsh soaps and shampoos  Fall Last week tripped over vacuum cord and hit chin, left  arm and hip, no significant pain except behind left ear, try Aspercreme prn

## 2012-11-24 ENCOUNTER — Other Ambulatory Visit: Payer: Self-pay

## 2012-11-24 MED ORDER — DESOXIMETASONE 0.25 % EX CREA: 1.0000 "application " | TOPICAL_CREAM | CUTANEOUS | Status: DC

## 2012-11-24 NOTE — Telephone Encounter (Signed)
Ok to rx this ointment, sig: apply small amount to affected area bid prn irritation and pruritus, disp 60 gm 2 rf

## 2012-11-24 NOTE — Telephone Encounter (Signed)
Patient left a message on voicemail stating the medication she uses for her hemorhoids is Desoximetasone Ointment USP 0.25%. Patient would like this sent to [facility]. Please advise?

## 2012-11-28 ENCOUNTER — Telehealth: Payer: Self-pay | Admitting: Oncology

## 2012-11-28 ENCOUNTER — Ambulatory Visit (HOSPITAL_BASED_OUTPATIENT_CLINIC_OR_DEPARTMENT_OTHER): Payer: Medicare Other | Admitting: Oncology

## 2012-11-28 ENCOUNTER — Other Ambulatory Visit (HOSPITAL_BASED_OUTPATIENT_CLINIC_OR_DEPARTMENT_OTHER): Payer: Medicare Other | Admitting: Lab

## 2012-11-28 ENCOUNTER — Encounter: Payer: Self-pay | Admitting: Oncology

## 2012-11-28 VITALS — BP 113/70 | HR 99 | Temp 98.7°F | Resp 18 | Ht 66.0 in | Wt 129.9 lb

## 2012-11-28 DIAGNOSIS — Z853 Personal history of malignant neoplasm of breast: Secondary | ICD-10-CM

## 2012-11-28 DIAGNOSIS — Z1231 Encounter for screening mammogram for malignant neoplasm of breast: Secondary | ICD-10-CM

## 2012-11-28 DIAGNOSIS — M899 Disorder of bone, unspecified: Secondary | ICD-10-CM

## 2012-11-28 LAB — CBC WITH DIFFERENTIAL/PLATELET
BASO%: 0.8 % (ref 0.0–2.0)
Basophils Absolute: 0 10*3/uL (ref 0.0–0.1)
EOS%: 1.7 % (ref 0.0–7.0)
HCT: 35.5 % (ref 34.8–46.6)
HGB: 12.1 g/dL (ref 11.6–15.9)
LYMPH%: 24.4 % (ref 14.0–49.7)
MCH: 31.5 pg (ref 25.1–34.0)
MCHC: 34.1 g/dL (ref 31.5–36.0)
MCV: 92.6 fL (ref 79.5–101.0)
MONO%: 7.1 % (ref 0.0–14.0)
NEUT%: 66 % (ref 38.4–76.8)
lymph#: 1.6 10*3/uL (ref 0.9–3.3)

## 2012-11-28 LAB — COMPREHENSIVE METABOLIC PANEL (CC13)
ALT: 18 U/L (ref 0–55)
AST: 18 U/L (ref 5–34)
Alkaline Phosphatase: 118 U/L (ref 40–150)
BUN: 19 mg/dL (ref 7.0–26.0)
Calcium: 9.3 mg/dL (ref 8.4–10.4)
Creatinine: 0.8 mg/dL (ref 0.6–1.1)
Total Bilirubin: 0.5 mg/dL (ref 0.20–1.20)

## 2012-11-28 NOTE — Telephone Encounter (Signed)
Pt appt.with Dr. Kelly Splinter is 12/23/12@10 :30. Medical records faxed. Pt is aware

## 2012-11-28 NOTE — Progress Notes (Signed)
OFFICE PROGRESS NOTE   11/28/2012   Physicians:S.Alena Bills (urology), C.Gessner, P.Ross, G.Ines Bloomer Sanger   INTERVAL HISTORY:   Patient is seen, together with husband, in scheduled follow up of her history of locally recurrent left breast carcinoma, continuing Arimidex.  History is of 2 primary lobular left breast carcinomas. Her initial diagnosis was March 2001, treated systemically with CMF followed by 5 years of tamoxifen. Second diagnosis was Jan. 2008 (tho patient did not agree to surgery until July 2008) and subsequently had no adjuvant systemic treatment at her decision. She had a complicated course with difficult healing after right breast reconstruction. She had skin recurrence lateral right chest wall excised in Sept 2010 then treated with xeloda from Oct 2010 thru Feb 2011 and has been on Arimidex since July 2011, which she is tolerating well. She has had no documented active disease since this most recent treatment, including PET 05-14-12 with no evidence of metastatic disease. Bone density scan was done at [facility] 09-15-2012, with osteopenia not significantly changed from 09-2011; on the high risk aromatase inhibitor and with known low bone density, it is medically necessary to follow bone density scans yearly. Last mammogram (left) was 12-25-11.  Patient is contemplating left mastectomy with reconstruction due to asymmetry of breasts now, and her concerns about risk of a primary right breast cancer.  Patient reports that she has felt well recently. She had lower respiratory infection with significant cough ~ 2 weeks ago, since which time she has had point tenderness just above reconstructed right breast. She has no cough or symptoms of infection now and no SOB> She is walking up to 3 miles at a time, tho not yet daily. She is for dilation of esophagus by Dr Leone Payor in late Jan. She has no other pain and no changes on breast self exam. Remainder of 10 point Review of  Systems negative.  Objective:  Vital signs in last 24 hours:  BP 113/70  Pulse 99  Temp 98.7 F (37.1 C) (Oral)  Resp 18  Ht 5\' 6"  (1.676 m)  Wt 129 lb 14.4 oz (58.922 kg)  BMI 20.97 kg/m2 Weight is up 3 lbs. Easily ambulatory, alert and appropriate, interacts easily with husband.   HEENT:PERRLA, sclera clear, anicteric, oropharynx clear, no lesions and neck supple with midline trachea normal hair pattern LymphaticsCervical, supraclavicular, and axillary nodes normal. Resp: clear to auscultation bilaterally and normal percussion bilaterally Cardio: regular rate and rhythm GI: soft, non-tender; bowel sounds normal; no masses,  no organomegaly Extremities: extremities normal, atraumatic, no cyanosis or edema Neuro:grossly nonfocla Breast:reconstructed right breast and right lateral chest wall into axilla without findings of concern for local recurrence. Point tenderness at 1200 just above the (high) reconstruction, without ecchymosis or other skin findings.Left breast without dominant mass or skin/nipple findings of concern.  Skin as described above.  Lab Results:  Results for orders placed in visit on 11/28/12  CBC WITH DIFFERENTIAL      Component Value Range   WBC 6.5  3.9 - 10.3 10e3/uL   NEUT# 4.3  1.5 - 6.5 10e3/uL   HGB 12.1  11.6 - 15.9 g/dL   HCT 40.9  81.1 - 91.4 %   Platelets 301  145 - 400 10e3/uL   MCV 92.6  79.5 - 101.0 fL   MCH 31.5  25.1 - 34.0 pg   MCHC 34.1  31.5 - 36.0 g/dL   RBC 7.82  9.56 - 2.13 10e6/uL   RDW 13.8  11.2 - 14.5 %  lymph# 1.6  0.9 - 3.3 10e3/uL   MONO# 0.5  0.1 - 0.9 10e3/uL   Eosinophils Absolute 0.1  0.0 - 0.5 10e3/uL   Basophils Absolute 0.0  0.0 - 0.1 10e3/uL   NEUT% 66.0  38.4 - 76.8 %   LYMPH% 24.4  14.0 - 49.7 %   MONO% 7.1  0.0 - 14.0 %   EOS% 1.7  0.0 - 7.0 %   BASO% 0.8  0.0 - 2.0 %  COMPREHENSIVE METABOLIC PANEL (CC13)      Component Value Range   Sodium 139  136 - 145 mEq/L   Potassium 3.4 (*) 3.5 - 5.1 mEq/L    Chloride 102  98 - 107 mEq/L   CO2 28  22 - 29 mEq/L   Glucose 94  70 - 99 mg/dl   BUN 16.1  7.0 - 09.6 mg/dL   Creatinine 0.8  0.6 - 1.1 mg/dL   Total Bilirubin 0.45  0.20 - 1.20 mg/dL   Alkaline Phosphatase 118  40 - 150 U/L   AST 18  5 - 34 U/L   ALT 18  0 - 55 U/L   Total Protein 7.4  6.4 - 8.3 g/dL   Albumin 3.5  3.5 - 5.0 g/dL   Calcium 9.3  8.4 - 40.9 mg/dL     Studies/Results: Bone density scan from [facility] 09-15-12 reviewed with patient  With heterogeneously dense breast tissue by mammography to date, I have encouraged her to do the 3D/ tomo mammography with upcoming yearly mammogram, which is ordered now as screening to be done 3D/tomo. Patient is aware of copay.  Medications: I have reviewed the patient's current medications. She agrees with continuing arimidex.  Patient would like to discuss reconstruction with Dr Wayland Denis, whom she has seen in consultation previously. Patient states that the problems with healing of right reconstruction "were Dr Charlesetta Garibaldi fault because he did not tell me that I had to stop smoking"; I have reminded her that multiple physicians had informed her of risks of smoking. Fortunately, she has not smoked now since 2008. I also mentioned that risks of surgery with general anesthesia and discomfort related to surgery and reconstruction are significant considerations.  Assessment/Plan: 1. History of two primary lobular right breast carcinomas in 2001 and 2008, with skin recurrence lateral right chest wall Sept 2010: treatment history as above, continuing Arimidex begun July 2011. Left tomo mammogram Feb 2014 requested. I will see her back in 6 months or sooner if needed. Referral to Dr Wayland Denis for discussion of reconstruction if she proceeds with left mastectomy. 2.Osteopenia: stable by bone density scan Nov 2013. Encouraged exercise and calcium. Needs yearly bone density scans as above. 3.long past tobacco 4.heavy ETOH use in past  year 5.significant history of anxiety/depression with ETOH abuse as above 6.point tenderness right chest wall since respiratory illness: suggested she see if this resolves in next few weeks without other intervention for now.  7.for esophageal dilation upcoming. 8.flu vaccine done this fall  Patient and husband were in agreement with plan above Arabell Neria P, MD   11/28/2012, 5:58 PM

## 2012-11-28 NOTE — Patient Instructions (Signed)
Dr Wayland Denis -- plastic surgery

## 2012-11-28 NOTE — Telephone Encounter (Signed)
gv and printed appt schedule for pt for Jan thru July....scheduled mammo with [facility] for Feb 2.13 @ 12:00pm...gv kim referral for Dr. Kelly Splinter @ [facility]

## 2012-12-02 ENCOUNTER — Telehealth: Payer: Self-pay

## 2012-12-02 NOTE — Telephone Encounter (Signed)
Told Ms. Pinkley that her KCL was slightly low at 3.4.  Dr. Darrold Span suggests increasing potassium in her diet.  Will mail a copy of her labs to her home as she requested.

## 2012-12-03 ENCOUNTER — Ambulatory Visit (AMBULATORY_SURGERY_CENTER): Payer: Medicare Other | Admitting: *Deleted

## 2012-12-03 VITALS — Ht 66.0 in | Wt 135.4 lb

## 2012-12-03 DIAGNOSIS — R1310 Dysphagia, unspecified: Secondary | ICD-10-CM

## 2012-12-03 DIAGNOSIS — K222 Esophageal obstruction: Secondary | ICD-10-CM

## 2012-12-03 NOTE — Progress Notes (Signed)
During previsit today explained to patient to Albuterol inhaler with her to procedure. She understands.

## 2012-12-03 NOTE — Progress Notes (Signed)
Patient states 2 days ago had episode when she stood up, sharpe pain in mid. Epigastric area, took Pepto and drink milk and then it went away. She states that it felt like when she had "gallstones". Patient wants me to let Dr.Gessner know. Patient is for EGD on 12-03-12.  Sent this note to Dr.Gessner.

## 2012-12-12 ENCOUNTER — Encounter: Payer: Self-pay | Admitting: Internal Medicine

## 2012-12-12 ENCOUNTER — Telehealth: Payer: Self-pay | Admitting: Internal Medicine

## 2012-12-12 ENCOUNTER — Telehealth: Payer: Self-pay | Admitting: *Deleted

## 2012-12-12 ENCOUNTER — Ambulatory Visit (AMBULATORY_SURGERY_CENTER): Payer: Medicare Other | Admitting: Internal Medicine

## 2012-12-12 VITALS — BP 134/77 | HR 75 | Temp 96.0°F | Resp 25 | Ht 66.0 in | Wt 135.0 lb

## 2012-12-12 DIAGNOSIS — K222 Esophageal obstruction: Secondary | ICD-10-CM

## 2012-12-12 DIAGNOSIS — K200 Eosinophilic esophagitis: Secondary | ICD-10-CM

## 2012-12-12 DIAGNOSIS — R1310 Dysphagia, unspecified: Secondary | ICD-10-CM

## 2012-12-12 MED ORDER — SODIUM CHLORIDE 0.9 % IV SOLN: 500.0000 mL | INTRAVENOUS | Status: DC

## 2012-12-12 NOTE — Progress Notes (Deleted)
Not called into procedure room for dilation- information taken from report

## 2012-12-12 NOTE — Progress Notes (Signed)
Stable to RR 

## 2012-12-12 NOTE — Telephone Encounter (Signed)
I would expect her to have some painful swallowing and chest pain today - that is why liquids only recommended I re-inspected her esophagus after dilation and no severe lacerations seen.  If she gets a fever, vomiting of blood, severe unremitting pain - call back or go to ED

## 2012-12-12 NOTE — Telephone Encounter (Signed)
Patient stated that she had a "bad" sore throat from her procedure.  She had been dilated earlier today.  She hadn't tried anything to relieve the pain.  I suggested gargling with salt water, and trying some chloraseptic spray.   She also wanted to take an advil and I okayed that.    She stated a pain scale of #4.  I will notify the doctor.   The patient is not nauseated, and not running a fever.   She can swallow, but it is painful.   I told her to call us back this afternoon so we could review again at that time.

## 2012-12-12 NOTE — Op Note (Signed)
[facility] [address] [place], [address]   ENDOSCOPY PROCEDURE REPORT  PATIENT: Anne, [name]  MR#: [id number] BIRTHDATE: 1941/04/08 , 71  yrs. old GENDER: Female ENDOSCOPIST: [name], MD, [facility] PROCEDURE DATE:  12/12/2012 PROCEDURE:  Esophagoscopy and Maloney dilation of esophagus ASA CLASS:     Class II INDICATIONS:  Dysphagia. MEDICATIONS: propofol (Diprivan) 100mg  IV, MAC sedation, administered by CRNA, and These medications were titrated to patient response per physician's verbal order TOPICAL ANESTHETIC: none  DESCRIPTION OF PROCEDURE: After the risks benefits and alternatives of the procedure were thoroughly explained, informed consent was obtained.  The LB-GIF Q180 [id number] endoscope was introduced through the mouth and advanced to the stomach body. Without limitations. The instrument was slowly withdrawn as the mucosa was fully examined.        ESOPHAGUS: Eosinophilic esophagitis with mucosal changes that included a ringed esophagus, a small-caliber esophagus and white spots were found in the entire esophagus.   The scope would only reach the GE junction before dilation. It was removed and a 40 fr Maloney dilator was passed with moderate resistance as in the past. No heme.The scope was reinserted showing some mucosal tears and heme - small -moderate and was able to enter stomach.  STOMACH: The stomach otherwise appeared normal on exam to proximal body. Retroflexed views revealed Tight LES but better after dilation..     The scope was then withdrawn from the patient and the procedure completed.  COMPLICATIONS: There were no complications. ENDOSCOPIC IMPRESSION: 1.   Eosinophilic esophagitis were found in the entire esophagus with multiple strictures and rings - dilated to 40 [place]. Scope then entered stomach. 2.   The stomach otherwise appeared normal - exam to proximal body.  RECOMMENDATIONS: Liquids only today, try soft and  solid foods tomorrow. Call and get an appointment to see Dr.  Leone Payor for February. We can discuss therapy that might prevent need for dilation in future.  REPEAT EXAM: If needed - likely  eSigned:  [name], MD, [facility] 12/12/2012 8:52 AM   CC: The Patient

## 2012-12-12 NOTE — Progress Notes (Signed)
No complaints noted in the recovery room. Maw   

## 2012-12-12 NOTE — Progress Notes (Signed)
Pt firmly stated that  At she wanted IV in Left AC and did not want IV anywhere else.  Pt took Ativan with gulp of water 710.  CRNA informed

## 2012-12-12 NOTE — Patient Instructions (Addendum)
As you would expect - the esophagus was narrow. I dilated the esophagus again. I expect that you will likely spit up a little blood but should not vomit largte amounts. Stay on liquids all day today - do not drink very cold or hot liquids. Tomorrow you may try eating solids - starting with soft foods.  I have learned some new information about eosinophilic esophagitis lately and would like you to schedule a follow-up for February or March to discuss.  Also - we have to be very careful dilating you and you could possibly benefit from another dilation in the next month or so also. If you are not significantly better by next week - call me.  Thank you for choosing me and [facility].  [name], MD, FACG    YOU HAD AN ENDOSCOPIC PROCEDURE TODAY AT THE [facility]: Refer to the procedure report that was given to you for any specific questions about what was found during the examination.  If the procedure report does not answer your questions, please call your gastroenterologist to clarify.  If you requested that your care partner not be given the details of your procedure findings, then the procedure report has been included in a sealed envelope for you to review at your convenience later.  YOU SHOULD EXPECT: Some feelings of bloating in the abdomen. Passage of more gas than usual.  Walking can help get rid of the air that was put into your GI tract during the procedure and reduce the bloating. If you had a lower endoscopy (such as a colonoscopy or flexible sigmoidoscopy) you may notice spotting of blood in your stool or on the toilet paper. If you underwent a bowel prep for your procedure, then you may not have a normal bowel movement for a few days.  DIET:   Drink plenty of fluids but you should avoid alcoholic beverages for 24 hours.  Per Dr. Leone Payor you will be on a liquid diet the rest of the day.  You may start with a soft diet Sat. Am and advance to regular  diet.  ACTIVITY: Your care partner should take you home directly after the procedure.  You should plan to take it easy, moving slowly for the rest of the day.  You can resume normal activity the day after the procedure however you should NOT DRIVE or use heavy machinery for 24 hours (because of the sedation medicines used during the test).    SYMPTOMS TO REPORT IMMEDIATELY: A gastroenterologist can be reached at any hour.  During normal business hours, 8:30 AM to 5:00 PM Monday through Friday, call [phone].  After hours and on weekends, please call the GI answering service at [phone] who will take a message and have the physician on call contact you.     Following upper endoscopy (EGD)  Vomiting of blood or coffee ground material  New chest pain or pain under the shoulder blades  Painful or persistently difficult swallowing  New shortness of breath  Fever of 100F or higher  Black, tarry-looking stools  FOLLOW UP: If any biopsies were taken you will be contacted by phone or by letter within the next 1-3 weeks.  Call your gastroenterologist if you have not heard about the biopsies in 3 weeks.  Our staff will call the home number listed on your records the next business day following your procedure to check on you and address any questions or concerns that you may have at that time  regarding the information given to you following your procedure. This is a courtesy call and so if there is no answer at the home number and we have not heard from you through the emergency physician on call, we will assume that you have returned to your regular daily activities without incident.  SIGNATURES/CONFIDENTIALITY: You and/or your care partner have signed paperwork which will be entered into your electronic medical record.  These signatures attest to the fact that that the information above on your After Visit Summary has been reviewed and is understood.  Full responsibility of the  confidentiality of this discharge information lies with you and/or your care-partner.

## 2012-12-12 NOTE — Progress Notes (Signed)
Patient did not experience any of the following events: a burn prior to discharge; a fall within the facility; wrong site/side/patient/procedure/implant event; or a hospital transfer or hospital admission upon discharge from the facility. (G8907) Patient did not have preoperative order for IV antibiotic SSI prophylaxis. (G8918)  

## 2012-12-15 ENCOUNTER — Telehealth: Payer: Self-pay | Admitting: *Deleted

## 2012-12-15 ENCOUNTER — Telehealth: Payer: Self-pay | Admitting: Internal Medicine

## 2012-12-15 NOTE — Telephone Encounter (Signed)
Patient called again today, and stated that every time she drank anything that she feels a bad #10 pain where her left breast used to be.  She states that it's the only time that it hurt.   She also stated that she still had a sore throat yet she is eating well.    She wants to know what to do when the pain starts.  She stated that where her breast used to be that she is very "sensitive."

## 2012-12-15 NOTE — Telephone Encounter (Signed)
  Follow up Call-  Call back number 12/12/2012  Post procedure Call Back phone  # [phone]  Permission to leave phone message Yes     Patient questions:  Left message to call if necessary.

## 2012-12-17 ENCOUNTER — Other Ambulatory Visit: Payer: Self-pay

## 2012-12-17 MED ORDER — AMITRIPTYLINE HCL 150 MG PO TABS: 150.0000 mg | ORAL_TABLET | Freq: Every day | ORAL | Status: DC

## 2012-12-17 NOTE — Telephone Encounter (Signed)
Pt left a message stating that [facility] didn't have the correct kind of Amitriptyline and would like 2 tablets sent to [facility] in [place] until [facility] gets the [facility] ones in. I sent in 2 tabs and informed patient

## 2012-12-19 ENCOUNTER — Telehealth: Payer: Self-pay | Admitting: Internal Medicine

## 2012-12-19 ENCOUNTER — Telehealth: Payer: Self-pay

## 2012-12-19 NOTE — Telephone Encounter (Signed)
Anne [name]  Left a message stating that she had a swollen lymph node in her left neck near the clavicle.  Dr. Abner Greenspan could not see her today.  She wanted to see if Dr. Darrold Span could see her.   In reviewing chart noticed patent had an appt. tomorrow  with [facility] on [address].   Left message for Anne [name] noting appt.  Dr. Darrold Span suggests  follow up with Dr. Abner Greenspan PCP.

## 2012-12-19 NOTE — Telephone Encounter (Signed)
I spoke to her and apologized about delay in follow-up - she was understanding  She had chest pain for a while after dilation - that is gone  She has had 2 episodes of dysphagia  To increase fluticasone to 880 ug bid and will look into getting a viscous solution

## 2012-12-19 NOTE — Telephone Encounter (Signed)
Patient states she needs to speak with Dr. Leone Payor.  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

## 2012-12-19 NOTE — Telephone Encounter (Signed)
Patient very upset that she has not received a return phone call.  She called with complaints of chest pain on 12/15/12.  She spoke with the [facility] and was told to expect a phone call back.  She reports that she has had pain in her chest up to yesterday.  She reports this am she had some burning in her chest that resolved soon after she was out of bed.  She is able to tolerate a normal diet, she denies fever, chest pain, or other complaints at this time.  She wants to know why she was never called back, why she had so much pain after the procedure, and what the next step is.    I have apologized for the delays.  Per the procedure visit the patient is to have a follow up to discuss EE.  I have scheduled a follow up visit for 12/30/12 11:30.  She wants to speak with Dr. Leone Payor today.  She was given the impression she was to have a return call from him on 12/15/12.  Dr. Leone Payor please call her today, she is aware you are in clinic at this time and it will be later this afternoon.

## 2012-12-20 ENCOUNTER — Encounter: Payer: Self-pay | Admitting: Family Medicine

## 2012-12-20 ENCOUNTER — Ambulatory Visit (INDEPENDENT_AMBULATORY_CARE_PROVIDER_SITE_OTHER): Payer: Medicare Other | Admitting: Family Medicine

## 2012-12-20 VITALS — BP 106/66 | HR 96 | Temp 98.5°F | Wt 131.0 lb

## 2012-12-20 DIAGNOSIS — R599 Enlarged lymph nodes, unspecified: Secondary | ICD-10-CM

## 2012-12-20 NOTE — Progress Notes (Signed)
Subjective:    Patient ID: Anne [name], female    DOB: Dec 03, 1940, 72 y.o.   MRN: [id number]  HPI Here for a swollen LN on L side of her neck  Noticed it 2 days ago  Not tender   No st or runny nose  Did have uri and bronchitis on 12/31- abx and felt better  Really been ok since then   Did have dermatitis behind her ear - given cream by her PCP middle of jan  No mouth pain or tooth issues or problems    She is a cancer patient  Has breast cancer - is on arimidex (year 2 )  Not on any active chemo  Sees Dr Darrold Span  Has next f/u in July    Patient Active Problem List  Diagnosis  . PURE HYPERCHOLESTEROLEMIA  . CAROTID ARTERY STENOSIS  . ESOPHAGEAL STRICTURE  . GERD  . PERSONAL HX BREAST CANCER  . History of chicken pox  . History of shingles  . History of measles  . Osteopenia  . Tachycardia  . Alcohol abuse  . Anxiety and depression  . Diarrhea  . Baker's cyst of knee  . Hematuria  . Hip pain  . KNEE PAIN  . Atypical chest pain  . Anemia  . Folliculitis of nose  . Strain of sternocleidomastoid muscle  . Eosinophilic esophagitis  . Hand swelling  . Urinary incontinence  . Allergic state  . Peanut allergy suspected  . Preventative health care  . Acute bronchitis  . Dermatitis  . Fall   Past Medical History  Diagnosis Date  . Anemia   . COPD (chronic obstructive pulmonary disease)   . Hyperlipidemia   . Pneumonia 2-10    pleurisy  . Arthritis   . Depression   . GERD (gastroesophageal reflux disease) 09/29/2009    improved s/p cholecystectomy and esophagus dilatation  . History of chicken pox   . History of shingles     2 episodes  . History of measles   . Osteopenia 03/14/2011  . 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 Pure hypercholesterolemia 10/17/2010  . 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 ESOPHAGEAL STRICTURE 03/29/2009  . CAROTID ARTERY STENOSIS 01/13/2010  . ALKALINE PHOSPHATASE, ELEVATED 03/15/2009  . Anxiety and  depression 04/28/2011  . Vaginitis 05/22/2011  . Baker's cyst of knee 05/22/2011  . Trauma 10/18/2011  . Radial neck fracture 10/2011    minimally displaced  . Atypical chest pain 11/30/2011  . Folliculitis of nose 12/31/2011  . Esophageal ring   . 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 Urinary incontinence 03/19/2012  . Allergic state 06/10/2012  . Hematuria   . Dysphagia   . URI (upper respiratory infection) 09/02/2012  . Preventative health care 10/21/2012  . Dermatitis 11/23/2012  . Fall 11/23/2012   Past Surgical History  Procedure Laterality Date  . Anterior cruciate ligament repair  08, 09, 10  . Ercp, cbd stone extraction  2010  . Cholecystectomy  2010  . Egd/ dili  2010, 2012  . Breast surgery  2001    lumpectomy (01)  . Colonoscopy  09/05/10    cecal avm's  . Esophagogastroduodenoscopy  01/08/2012    Procedure: ESOPHAGOGASTRODUODENOSCOPY (EGD);  Surgeon: [name], MD;  Location: [facility];  Service: Endoscopy;  Laterality: N/A;  . Savory dilation  01/08/2012    Procedure: SAVORY DILATION;  Surgeon: [name], MD;  Location: [facility];  Service: Endoscopy;  Laterality: N/A;  need xray  . Mastectomy modified radical      , Mastectomy modified radical (08), breast reconstruction, CA lesions excised lateral abd wall 2010  . Breast reconstructive surgery  2008, 2009, 2010   History  Substance Use Topics  . Smoking status: Former Smoker    Types: Cigarettes    Quit date: 05/22/2007  . Smokeless tobacco: Never Used  . Alcohol Use: 0.6 oz/week    1 Glasses of wine per week     Comment: 6 oz daily   Family History  Problem Relation Age of Onset  . Heart disease Father   . Lung cancer Father     smoker  . Cirrhosis Sister     Primary Biliary  . Breast cancer    . Stroke Maternal Grandmother   . Alcohol abuse Maternal Grandfather   . Heart disease Paternal Grandfather   . Anxiety disorder Sister   . Osteoporosis Sister   . Arthritis  Sister     Rheumatoid  . Skin cancer Sister   . Osteoporosis Sister   . Cancer Sister     multiple skin cancers, over 90 excisions.  . Other Mother     tic deleauroux  . Anesthesia problems Neg Hx   . Hypotension Neg Hx   . Malignant hyperthermia Neg Hx   . Pseudochol deficiency Neg Hx   . Colon cancer Neg Hx   . Ovarian cancer    . Pancreatic cancer     Allergies  Allergen Reactions  . Codeine Other (See Comments)    "flu like symptoms"  . Diphenhydramine Hcl Other (See Comments)    "nervous and upset"  . Morphine Other (See Comments)    "flu like symptoms"  . Peanut-Containing Drug Products Hives  . Zofran Other (See Comments)    HEADACHE   Current Outpatient Prescriptions on File Prior to Visit  Medication Sig Dispense Refill  . amitriptyline (ELAVIL) 150 MG tablet Take 1 tablet (150 mg total) by mouth at bedtime.  2 tablet  0  . anastrozole (ARIMIDEX) 1 MG tablet Take 1 tablet (1 mg total) by mouth daily.  90 tablet  1  . aspirin 81 MG tablet Take 81 mg by mouth daily.        [name] atorvastatin (LIPITOR) 40 MG tablet Take 1 tablet (40 mg total) by mouth daily.  90 tablet  3  . beta carotene 45409 UNIT capsule Take 25,000 Units by mouth daily.        . calcium carbonate (TUMS - DOSED IN MG ELEMENTAL CALCIUM) 500 MG chewable tablet Chew 750 mg by mouth 2 (two) times daily.       . Cholecalciferol (VITAMIN D3) 1000 UNITS CAPS Take 2 tablets by mouth daily.        [name] FERRETTS 325 (106 FE) MG TABS take 1 tablet by mouth once daily  30 tablet  4  . fluticasone (FLOVENT HFA) 220 MCG/ACT inhaler Inhale 2 puffs into the lungs 2 (two) times daily. swallow  1 Inhaler  12  . LORazepam (ATIVAN) 1 MG tablet Take 1 tablet (1 mg total) by mouth every 6 (six) hours as needed for anxiety (x 10 days & then drop to tid x 7 days then back to bid).  90 tablet  1  . pantoprazole (PROTONIX) 40 MG tablet Take 1 tablet (40 mg total) by mouth 2 (two) times daily before a meal. 30-60 minutes before  breakfast  and supper  180 tablet  3  . Probiotic Product (PROBIOTIC FORMULA PO) Take 1 capsule by mouth daily.      . vitamin B-12 (CYANOCOBALAMIN) 1000 MCG tablet Take 1,000 mcg by mouth daily.         No current facility-administered medications on file prior to visit.      Review of Systems Review of Systems  Constitutional: Negative for fever, appetite change, fatigue and unexpected weight change.  Eyes: Negative for pain and visual disturbance.  Respiratory: Negative for cough and shortness of breath.   Cardiovascular: Negative for cp or palpitations    Gastrointestinal: Negative for nausea, diarrhea and constipation.  Genitourinary: Negative for urgency and frequency.  Skin: Negative for pallor, pos for recent rash behind L ear that is better, pos for recent papule in scalp after trying a new hair product  Neurological: Negative for weakness, light-headedness, numbness and headaches.  Hematological: Negative for adenopathy. Does not bruise/bleed easily.  Psychiatric/Behavioral: Negative for dysphoric mood. The patient is not nervous/anxious.         Objective:   Physical Exam  Constitutional: She appears well-developed and well-nourished. No distress.  HENT:  Head: Normocephalic and atraumatic.  Right Ear: External ear normal.  Left Ear: External ear normal.  Nose: Nose normal.  Mouth/Throat: Oropharynx is clear and moist. No oropharyngeal exudate.  Eyes: Conjunctivae and EOM are normal. Pupils are equal, round, and reactive to light. Right eye exhibits no discharge. Left eye exhibits no discharge. No scleral icterus.  Neck: Normal range of motion. Neck supple. No JVD present. No thyromegaly present.  One shotty LN L post cervical area that is nt and mobile   Cardiovascular: Normal rate and regular rhythm.   Pulmonary/Chest: Effort normal and breath sounds normal. No respiratory distress. She has no wheezes. She has no rales.  Musculoskeletal: She exhibits no edema.   Lymphadenopathy:    She has cervical adenopathy.  Neurological: She is alert. She has normal reflexes.  Skin: Skin is warm and dry. No rash noted. No erythema. No pallor.  Scab on top of scalp from recent papule Prev L ear rash is resolved   No axillary adenopathy  Psychiatric: She has a normal mood and affect.          Assessment & Plan:

## 2012-12-20 NOTE — Patient Instructions (Addendum)
You have a small swollen lymph node in your neck on the Left side  I think this may be reactive from your recent rash or papule on your scalp  If it enlarges or hurts - contact us or your oncologist  It is does not shrink or go away in 1-2 weeks - also let us know (or if fever or other symptoms)

## 2012-12-20 NOTE — Assessment & Plan Note (Signed)
Posterior shotty lymph node in L post cervical area - I suspect this correlates to recent rash behind ear or papule on scalp  No other adenopathy detected Will watch closely as pt is a cancer survivor See aVS- will update if worse or no resolution or other symptoms

## 2012-12-22 NOTE — Telephone Encounter (Signed)
New phone note was created addressing this phone note.

## 2012-12-25 ENCOUNTER — Ambulatory Visit: Payer: Medicare Other

## 2012-12-30 ENCOUNTER — Encounter: Payer: Self-pay | Admitting: Internal Medicine

## 2012-12-30 ENCOUNTER — Ambulatory Visit (INDEPENDENT_AMBULATORY_CARE_PROVIDER_SITE_OTHER): Payer: Medicare Other | Admitting: Internal Medicine

## 2012-12-30 ENCOUNTER — Telehealth: Payer: Self-pay

## 2012-12-30 VITALS — BP 90/62 | HR 80 | Ht 66.0 in | Wt 131.6 lb

## 2012-12-30 DIAGNOSIS — K200 Eosinophilic esophagitis: Secondary | ICD-10-CM

## 2012-12-30 MED ORDER — FLUTICASONE PROPIONATE HFA 220 MCG/ACT IN AERO: 4.0000 | INHALATION_SPRAY | Freq: Two times a day (BID) | RESPIRATORY_TRACT | Status: DC

## 2012-12-30 NOTE — Telephone Encounter (Signed)
Patient called back and I asked if she was able to talk and she said "yes", I informed pt that Dr Abner Greenspan had received her email from over the weekend and we are calling to check on her. Pt then stated she believed everything was ok but I could tell something wasn't right so I asked pt if she was sure she could talk and she said no. I then proceeded to tell pt to call me later today or at OR tomorrow or come and see me at OR if she can. Pt stated she would. I then told pt to tell spouse I was calling about a medication.

## 2012-12-30 NOTE — Patient Instructions (Addendum)
Follow up with [place] in three months.  Today you have been given information to read on eosinophilic esophagitis and the six food elimination diet.  Thank you for choosing me and [facility].  [name], M.D., [facility]

## 2012-12-30 NOTE — Telephone Encounter (Signed)
So please call and check with patient and let her know we have been made aware of her situation. Make sure she feels safe and see if she wants to come in to discuss

## 2012-12-30 NOTE — Telephone Encounter (Signed)
Please advise 

## 2012-12-30 NOTE — Telephone Encounter (Signed)
I left a message for patient to return my call at [facility] if its today or OR if its tomorrow.
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
## 2012-12-30 NOTE — Progress Notes (Signed)
  Subjective:    Patient ID: Anne [name], female    DOB: 1940-12-11, 72 y.o.   MRN: [id number]  HPI Patient returns with her husband. She is only had one episode of dysphagia since I spoke to her last week. She was dilated in early February. Fluticasone increased to 80 mcg twice a day and she is on her PPI twice a day. She feels much better overall. There is no more pain. Her husband was asking about a limitation diets we talked about food allergies, they have thought a book about gluten-free recipes also. Medications, allergies, past medical history, past surgical history, family history and social history are reviewed and updated in the EMR.   Review of Systems As above    Objective:   Physical Exam In no acute distress       Assessment & Plan:   1. Eosinophilic esophagitis    1. She is improved. I reminded her that this is a chronic disorder and compliance medical therapy can reduce the need for esophageal dilation. She indicates that she will comply. 2. We talked about the possibility of using viscous budesonide, I feel that would be cost prohibitive as it might cost thousand dollars a month, it could be gotten from [place] for $200 a month but her inhaler is $40 a month at this point in her PPI is negligible cost. 3. She will stick with the current treatment plan and see me in 3 months for reassessment. 4. We discussed the possibility of food allergy and is relationship and how that is difficult to sort out. Some basic information about a 6 food limitation diet was given and discussed, and she wishes to pursue that we should pursue that with a dietitian I believe.

## 2012-12-31 ENCOUNTER — Telehealth: Payer: Self-pay | Admitting: Internal Medicine

## 2012-12-31 NOTE — Telephone Encounter (Signed)
I spoke with the patient, she has no visual discoloration or coating on her tongue.

## 2012-12-31 NOTE — Telephone Encounter (Signed)
Patient advised.  She feels that some of her symptoms may be due to cashew allergy, she is going to cut this out of her diet and try the suggestions.  She will call back for any additional questions or concerns
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
## 2012-12-31 NOTE — Telephone Encounter (Signed)
Patient reports that she is having soreness in her mouth and feels like her "throat is closing up, but I took some advil and it is a little better".  She reports that her mouth and tongue are sore every am, but much worse today.  She has been using peroxide based mouthwash and feels this helps.  She feels the 4 buffs of fluticasone BID "might be too much".  Please advise what she should do from here.

## 2012-12-31 NOTE — Telephone Encounter (Signed)
Have her rinse and spit, do not swallow after she uses the inhaler. Can also try salt water gargle in between,  If that does not help we can prescribe something possibly.  I do not want her to change the dose

## 2012-12-31 NOTE — Telephone Encounter (Signed)
She might have thrush - can she check and see somehow?

## 2013-01-01 ENCOUNTER — Ambulatory Visit (INDEPENDENT_AMBULATORY_CARE_PROVIDER_SITE_OTHER): Payer: Medicare Other | Admitting: Family Medicine

## 2013-01-01 ENCOUNTER — Ambulatory Visit
Admission: RE | Admit: 2013-01-01 | Discharge: 2013-01-01 | Disposition: A | Discharge status: Home / Self Care | Payer: Medicare Other | Source: Ambulatory Visit | Attending: Oncology | Admitting: Oncology

## 2013-01-01 ENCOUNTER — Encounter: Payer: Self-pay | Admitting: Family Medicine

## 2013-01-01 VITALS — BP 100/70 | HR 75 | Temp 97.4°F | Ht 66.0 in | Wt 130.0 lb

## 2013-01-01 DIAGNOSIS — K2289 Other specified disease of esophagus: Secondary | ICD-10-CM

## 2013-01-01 MED ORDER — FLUCONAZOLE 150 MG PO TABS: ORAL_TABLET | ORAL | Status: DC

## 2013-01-01 NOTE — Assessment & Plan Note (Signed)
Esophageal candidiasis vs irritative effect of the fluticasone spray that she is required to swallow. Will do 7d trial of diflucan.  Hold statin and cut amitriptyline dose in half while on diflucan.

## 2013-01-01 NOTE — Progress Notes (Signed)
OFFICE NOTE  01/01/2013  CC:  Chief Complaint  Patient presents with  . Sore Throat    is using Flovent for eosinophilic esophagitis, she is swallowing instead of inhaling     HPI: Patient is a 72 y.o. Caucasian female who is here for throat pain. Onset over the last 5-6 d, seems to have really started getting bad after a dose increase in her oropharyngeal fluticasone spray treatment for eosinophilic esophagitis (she had EGD by Dr. Leone Payor 12/12/12). She was unable to do her fluticasone dosing last night or this morning but does plan to try to continue this med as recommended by Dr. Leone Payor. Describes the pain in throat as being deep in the throat, near level of crycoid cartilage and down into thyroid gland region.  Says the discomfort spreads episodically to surrounding areas like into her shoulders and upper back and neck diffusely.   No recent URI, cough, or fever.    Pertinent PMH:  Past Medical History  Diagnosis Date  . Anemia   . COPD (chronic obstructive pulmonary disease)   . Hyperlipidemia   . Pneumonia 2-10    pleurisy  . Arthritis   . Depression   . GERD (gastroesophageal reflux disease) 09/29/2009    improved s/p cholecystectomy and esophagus dilatation  . History of chicken pox   . History of shingles     2 episodes  . History of measles   . Osteopenia 03/14/2011  . 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 Pure hypercholesterolemia 10/17/2010  . 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 ESOPHAGEAL STRICTURE 03/29/2009  . CAROTID ARTERY STENOSIS 01/13/2010  . ALKALINE PHOSPHATASE, ELEVATED 03/15/2009  . Anxiety and depression 04/28/2011  . Vaginitis 05/22/2011  . Baker's cyst of knee 05/22/2011  . Trauma 10/18/2011  . Radial neck fracture 10/2011    minimally displaced  . Atypical chest pain 11/30/2011  . Folliculitis of nose 12/31/2011  . Esophageal ring   . 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 Urinary incontinence 03/19/2012  .  Allergic state 06/10/2012  . Hematuria   . Dysphagia   . URI (upper respiratory infection) 09/02/2012  . Preventative health care 10/21/2012  . Dermatitis 11/23/2012  . Fall 11/23/2012    MEDS:  Outpatient Prescriptions Prior to Visit  Medication Sig Dispense Refill  . amitriptyline (ELAVIL) 150 MG tablet Take 1 tablet (150 mg total) by mouth at bedtime.  2 tablet  0  . anastrozole (ARIMIDEX) 1 MG tablet Take 1 tablet (1 mg total) by mouth daily.  90 tablet  1  . aspirin 81 MG tablet Take 81 mg by mouth daily.        [name] atorvastatin (LIPITOR) 40 MG tablet Take 1 tablet (40 mg total) by mouth daily.  90 tablet  3  . beta carotene 40981 UNIT capsule Take 25,000 Units by mouth daily.        . calcium carbonate (TUMS - DOSED IN MG ELEMENTAL CALCIUM) 500 MG chewable tablet Chew 750 mg by mouth 2 (two) times daily.       . Cholecalciferol (VITAMIN D3) 1000 UNITS CAPS Take 2 tablets by mouth daily.        [name] FERRETTS 325 (106 FE) MG TABS take 1 tablet by mouth once daily  30 tablet  4  . fluticasone (FLOVENT HFA) 220 MCG/ACT inhaler Inhale 4 puffs into the lungs 2 (two) times daily. swallow  1 Inhaler  12  .  LORazepam (ATIVAN) 1 MG tablet Take 1 tablet (1 mg total) by mouth every 6 (six) hours as needed for anxiety (x 10 days & then drop to tid x 7 days then back to bid).  90 tablet  1  . pantoprazole (PROTONIX) 40 MG tablet Take 1 tablet (40 mg total) by mouth 2 (two) times daily before a meal. 30-60 minutes before breakfast and supper  180 tablet  3  . Probiotic Product (PROBIOTIC FORMULA PO) Take 1 capsule by mouth daily.      . vitamin B-12 (CYANOCOBALAMIN) 1000 MCG tablet Take 1,000 mcg by mouth daily.         No facility-administered medications prior to visit.    PE: Blood pressure 100/70, pulse 75, temperature 97.4 F (36.3 C), temperature source Temporal, height 5\' 6"  (1.676 m), weight 130 lb (58.968 kg). Gen: Alert, well appearing.  Patient is oriented to person, place, time, and  situation. ENT:  Eyes: no injection, icteris, swelling, or exudate.  EOMI, PERRLA. Nose: no drainage or turbinate edema/swelling.  No injection or focal lesion.  Mouth: lips without lesion/swelling.  Oral mucosa pink, dry.  Tongue dry, slight white crusty film on posterior region--this comes off onto tongue blade slightly with scraping.  Oropharynx without erythema, exudate, or swelling.    NECK: no abnormal LAD, no tenderness except for the area around lower anterior neck region diffusely.   IMPRESSION AND PLAN:  Esophageal pain Esophageal candidiasis vs irritative effect of the fluticasone spray that she is required to swallow. Will do 7d trial of diflucan.  Hold statin and cut amitriptyline dose in half while on diflucan.   An After Visit Summary was printed and given to the patient.  FOLLOW UP:  prn

## 2013-01-01 NOTE — Patient Instructions (Addendum)
Stop your lipitor (atorvastatin) and cut your amitriptyline in half for the 7d that you are on diflucan (fluconazole).

## 2013-01-05 ENCOUNTER — Telehealth: Payer: Self-pay | Admitting: Internal Medicine

## 2013-01-05 NOTE — Telephone Encounter (Signed)
Dr. Leone Payor please see Dr. Dietrich Pates note from 01/01/2013.  She has two days left of diflucan.  She has not taken the fluticasone since. Please advise.

## 2013-01-06 ENCOUNTER — Other Ambulatory Visit: Payer: Medicare Other

## 2013-01-06 NOTE — Telephone Encounter (Signed)
She could try the lower dose of fluticasone again that did not boter her when using 440 mcg bid (2 puffs)- we discussed obtaining a viscous budesonide Rx from [facility] - they can make it but I suspect it will be very expensive - we can double check that  I do not see that we have proof of Candida  See if [facility] can give you a cost estimate of viscous budesonide suspension I think it was 1 mg/ml and would need 2mg  daily so 60 mg/month

## 2013-01-06 NOTE — Telephone Encounter (Signed)
Patient advised She will try 2 puffs two times a day.  She will call back if her symptoms don;t improve

## 2013-01-08 ENCOUNTER — Telehealth: Payer: Self-pay | Admitting: Internal Medicine

## 2013-01-13 ENCOUNTER — Ambulatory Visit: Payer: Medicare Other | Admitting: Family Medicine

## 2013-01-13 ENCOUNTER — Other Ambulatory Visit (INDEPENDENT_AMBULATORY_CARE_PROVIDER_SITE_OTHER): Payer: Medicare Other

## 2013-01-13 DIAGNOSIS — E7800 Pure hypercholesterolemia, unspecified: Secondary | ICD-10-CM

## 2013-01-13 LAB — LDL CHOLESTEROL, DIRECT: Direct LDL: 117.4 mg/dL

## 2013-01-13 LAB — HEPATIC FUNCTION PANEL
Alkaline Phosphatase: 105 U/L (ref 39–117)
Bilirubin, Direct: 0 mg/dL (ref 0.0–0.3)
Total Protein: 7.6 g/dL (ref 6.0–8.3)

## 2013-01-13 LAB — LIPID PANEL
HDL: 65.7 mg/dL (ref >39.00)
VLDL: 31.6 mg/dL (ref 0.0–40.0)

## 2013-01-13 LAB — RENAL FUNCTION PANEL
Albumin: 3.9 g/dL (ref 3.5–5.2)
BUN: 16 mg/dL (ref 6–23)
CO2: 30 mEq/L (ref 19–32)
Chloride: 101 mEq/L (ref 96–112)
Phosphorus: 3.2 mg/dL (ref 2.3–4.6)

## 2013-01-13 LAB — TSH: TSH: 0.61 u[IU]/mL (ref 0.35–5.50)

## 2013-01-13 LAB — CBC
RDW: 14.5 % (ref 11.5–14.6)
WBC: 4.9 10*3/uL (ref 4.5–10.5)

## 2013-01-14 ENCOUNTER — Encounter: Payer: Self-pay | Admitting: Family Medicine

## 2013-01-14 ENCOUNTER — Ambulatory Visit (INDEPENDENT_AMBULATORY_CARE_PROVIDER_SITE_OTHER): Payer: Medicare Other | Admitting: Family Medicine

## 2013-01-14 VITALS — BP 110/70 | HR 100 | Temp 97.2°F | Ht 66.0 in | Wt 129.8 lb

## 2013-01-14 DIAGNOSIS — E7800 Pure hypercholesterolemia, unspecified: Secondary | ICD-10-CM

## 2013-01-14 DIAGNOSIS — L309 Dermatitis, unspecified: Secondary | ICD-10-CM

## 2013-01-14 NOTE — Progress Notes (Signed)
Patient ID: Anne [name], female   DOB: 12/19/1940, 72 y.o.   MRN: [id number] Anne [name] [id number] 1941/02/13 01/14/2013      Progress Note-Follow Up  Subjective  Chief Complaint  Chief Complaint  Patient presents with  . Follow-up    6 month    HPI  Patient is a 72 year old Caucasian female in today for followup. She had recent lab work. She has not been exercising as much his usual. She continues to follow a heart healthy diet. She is accompanied by her husband today. She has been complaining of some scaly, itchy ear itching skin on her hands but otherwise offers no acute complaints today. Denies chest pain, palpitations, shortness or breath, GI or GU concerns today  Past Medical History  Diagnosis Date  . Anemia   . COPD (chronic obstructive pulmonary disease)   . Hyperlipidemia   . Pneumonia 2-10    pleurisy  . Arthritis   . Depression   . GERD (gastroesophageal reflux disease) 09/29/2009    improved s/p cholecystectomy and esophagus dilatation  . History of chicken pox   . History of shingles     2 episodes  . History of measles   . Osteopenia 03/14/2011  . 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 Pure hypercholesterolemia 10/17/2010  . 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 ESOPHAGEAL STRICTURE 03/29/2009  . CAROTID ARTERY STENOSIS 01/13/2010  . ALKALINE PHOSPHATASE, ELEVATED 03/15/2009  . Anxiety and depression 04/28/2011  . Vaginitis 05/22/2011  . Baker's cyst of knee 05/22/2011  . Trauma 10/18/2011  . Radial neck fracture 10/2011    minimally displaced  . Atypical chest pain 11/30/2011  . Folliculitis of nose 12/31/2011  . Esophageal ring   . 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 Urinary incontinence 03/19/2012  . Allergic state 06/10/2012  . Hematuria   . Dysphagia   . URI (upper respiratory infection) 09/02/2012  . Preventative health care 10/21/2012  . Dermatitis 11/23/2012  . Fall 11/23/2012    Past Surgical  History  Procedure Laterality Date  . Anterior cruciate ligament repair  08, 09, 10  . Ercp, cbd stone extraction  2010  . Cholecystectomy  2010  . Egd/ dili  2010, 2012  . Breast surgery  2001    lumpectomy (01)  . Colonoscopy  09/05/10    cecal avm's  . Esophagogastroduodenoscopy  01/08/2012    Procedure: ESOPHAGOGASTRODUODENOSCOPY (EGD);  Surgeon: [name], MD;  Location: [facility];  Service: Endoscopy;  Laterality: N/A;  . Savory dilation  01/08/2012    Procedure: SAVORY DILATION;  Surgeon: [name], MD;  Location: [facility];  Service: Endoscopy;  Laterality: N/A;  need xray  . Mastectomy modified radical      , Mastectomy modified radical (08), breast reconstruction, CA lesions excised lateral abd wall 2010  . Breast reconstructive surgery  2008, 2009, 2010    Family History  Problem Relation Age of Onset  . Heart disease Father   . Lung cancer Father     smoker  . Cirrhosis Sister     Primary Biliary  . Breast cancer    . Stroke Maternal Grandmother   . Alcohol abuse Maternal Grandfather   . Heart disease Paternal Grandfather   . Anxiety disorder Sister   . Osteoporosis Sister   . Arthritis Sister     Rheumatoid  . Skin cancer Sister   . Osteoporosis Sister   .  Cancer Sister     multiple skin cancers, over 90 excisions.  . Other Mother     tic deleauroux  . Anesthesia problems Neg Hx   . Hypotension Neg Hx   . Malignant hyperthermia Neg Hx   . Pseudochol deficiency Neg Hx   . Colon cancer Neg Hx   . Ovarian cancer    . Pancreatic cancer      History   Social History  . Marital Status: Married    Spouse Name: N/A    Number of Children: N/A  . Years of Education: N/A   Occupational History  . Not on file.   Social History Main Topics  . Smoking status: Former Smoker    Types: Cigarettes    Quit date: 05/22/2007  . Smokeless tobacco: Never Used  . Alcohol Use: 0.6 oz/week    1 Glasses of wine per week     Comment: 6 oz daily  .  Drug Use: No  . Sexually Active: Yes -- Female partner(s)   Other Topics Concern  . Not on file   Social History Narrative  . No narrative on file    Current Outpatient Prescriptions on File Prior to Visit  Medication Sig Dispense Refill  . amitriptyline (ELAVIL) 150 MG tablet Take 1 tablet (150 mg total) by mouth at bedtime.  2 tablet  0  . anastrozole (ARIMIDEX) 1 MG tablet Take 1 tablet (1 mg total) by mouth daily.  90 tablet  1  . aspirin 81 MG tablet Take 81 mg by mouth daily.        [name] atorvastatin (LIPITOR) 40 MG tablet Take 1 tablet (40 mg total) by mouth daily.  90 tablet  3  . beta carotene 45409 UNIT capsule Take 25,000 Units by mouth daily.        . calcium carbonate (TUMS - DOSED IN MG ELEMENTAL CALCIUM) 500 MG chewable tablet Chew 750 mg by mouth 2 (two) times daily.       . Cholecalciferol (VITAMIN D3) 1000 UNITS CAPS Take 2 tablets by mouth daily.        [name] FERRETTS 325 (106 FE) MG TABS take 1 tablet by mouth once daily  30 tablet  4  . fluticasone (FLOVENT HFA) 220 MCG/ACT inhaler Inhale 4 puffs into the lungs 2 (two) times daily. swallow  1 Inhaler  12  . LORazepam (ATIVAN) 1 MG tablet Take 1 tablet (1 mg total) by mouth every 6 (six) hours as needed for anxiety (x 10 days & then drop to tid x 7 days then back to bid).  90 tablet  1  . pantoprazole (PROTONIX) 40 MG tablet Take 1 tablet (40 mg total) by mouth 2 (two) times daily before a meal. 30-60 minutes before breakfast and supper  180 tablet  3  . Probiotic Product (PROBIOTIC FORMULA PO) Take 1 capsule by mouth daily.      . vitamin B-12 (CYANOCOBALAMIN) 1000 MCG tablet Take 1,000 mcg by mouth daily.         No current facility-administered medications on file prior to visit.    Allergies  Allergen Reactions  . Codeine Other (See Comments)    "flu like symptoms"  . Diphenhydramine Hcl Other (See Comments)    "nervous and upset"  . Morphine Other (See Comments)    "flu like symptoms"  . Peanut-Containing Drug  Products Hives  . Zofran Other (See Comments)    HEADACHE    Review of Systems  Review of  Systems  Constitutional: Negative for fever and malaise/fatigue.  HENT: Negative for congestion.   Eyes: Negative for discharge.  Respiratory: Negative for shortness of breath.   Cardiovascular: Negative for chest pain, palpitations and leg swelling.  Gastrointestinal: Negative for nausea, abdominal pain and diarrhea.  Genitourinary: Negative for dysuria.  Musculoskeletal: Negative for falls.  Skin: Positive for itching and rash.       hands  Neurological: Negative for loss of consciousness and headaches.  Endo/Heme/Allergies: Negative for polydipsia.  Psychiatric/Behavioral: Negative for depression and suicidal ideas. The patient is not nervous/anxious and does not have insomnia.     Objective  BP 110/70  Pulse 100  Temp(Src) 97.2 F (36.2 C) (Temporal)  Ht 5\' 6"  (1.676 m)  Wt 129 lb 12.8 oz (58.877 kg)  BMI 20.96 kg/m2  SpO2 96%  Physical Exam  Physical Exam  Constitutional: She is oriented to person, place, and time and well-developed, well-nourished, and in no distress. No distress.  HENT:  Head: Normocephalic and atraumatic.  Eyes: Conjunctivae are normal.  Neck: Neck supple. No thyromegaly present.  Cardiovascular: Normal rate, regular rhythm and normal heart sounds.   Pulmonary/Chest: Effort normal and breath sounds normal. She has no wheezes.  Abdominal: She exhibits no distension and no mass.  Musculoskeletal: She exhibits no edema.  Lymphadenopathy:    She has no cervical adenopathy.  Neurological: She is alert and oriented to person, place, and time.  Skin: Skin is warm and dry. Rash noted. She is not diaphoretic. There is erythema.  Scaly, erythematous patches most notable on thumb  Psychiatric: Memory, affect and judgment normal.    Lab Results  Component Value Date   TSH 0.61 01/13/2013   Lab Results  Component Value Date   WBC 4.9 01/13/2013   HGB 12.5  01/13/2013   HCT 38.0 01/13/2013   MCV 93.2 01/13/2013   PLT 277.0 01/13/2013   Lab Results  Component Value Date   CREATININE 0.8 01/13/2013   BUN 16 01/13/2013   NA 139 01/13/2013   K 4.0 01/13/2013   CL 101 01/13/2013   CO2 30 01/13/2013   Lab Results  Component Value Date   ALT 16 01/13/2013   AST 18 01/13/2013   ALKPHOS 105 01/13/2013   BILITOT 0.6 01/13/2013   Lab Results  Component Value Date   CHOL 201* 01/13/2013   Lab Results  Component Value Date   HDL 65.70 01/13/2013   Lab Results  Component Value Date   LDLCALC 89 10/21/2012   Lab Results  Component Value Date   TRIG 158.0* 01/13/2013   Lab Results  Component Value Date   CHOLHDL 3 01/13/2013     Assessment & Plan  Dermatitis On hands, use Triamcinolone cream qhs  Anemia Resolved with blood draw. No changes  PURE HYPERCHOLESTEROLEMIA Mild, continue statin. Encouraged krill oil  Eosinophilic esophagitis Has dropped her flovent to 2 puffs daily and is doing better with rinsing after dosing after having a recent episode of thrush. This was well treated with Diflucan.

## 2013-01-14 NOTE — Patient Instructions (Addendum)
Premature Beats A premature beat is an extra heartbeat that happens earlier than normal. Premature beats are called premature atrial contractions (PACs) or premature ventricular contractions (PVCs) depending on the area of the heart where they start. CAUSES  Premature beats may be brought on by a variety of factors including:  Emotional stress.  Lack of sleep.  Caffeine.  Asthma medicines.  Stimulants.  Herbal teas.  Dietary supplements.  Alcohol. In most cases, premature beats are not dangerous and are not a sign of serious heart disease. Most patients evaluated for premature beats have completely normal heart function. Rarely, premature beats may be a sign of more significant heart problems or medical illness. SYMPTOMS  Premature beats may cause palpitations. This means you feel like your heart is skipping a beat or beating harder than usual. Sometimes, slight chest pain occurs with premature beats, lasting only a few seconds. This pain has been described as a "flopping" feeling inside the chest. In many cases, premature beats do not cause any symptoms and they are only detected when an electrocardiography test (EKG) or heart monitoring is performed. DIAGNOSIS  Your caregiver may run some tests to evaluate your heart such as an EKG or echocardiography. You may need to wear a portable heart monitor for several days to record the electrical activity of your heart. Blood testing may also be performed to check your electrolytes and thyroid function. TREATMENT  Premature beats usually go away with rest. If the problem continues, your caregiver will determine a treatment plan for you.  HOME CARE INSTRUCTIONS  Get plenty of rest over the next few days until your symptoms improve.  Avoid coffee, tea, alcohol, and soda (pop, cola).  Do not smoke. SEEK MEDICAL CARE IF:  Your symptoms continue after 1 to 2 days of rest.  You have new symptoms, such as chest pain or trouble  breathing. SEEK IMMEDIATE MEDICAL CARE IF:  You have severe chest pain or abdominal pain.  You have pain that radiates into the neck, arm, or jaw.  You faint or have extreme weakness.  You have shortness of breath.  Your heartbeat races for more than 5 seconds. MAKE SURE YOU:  Understand these instructions.  Will watch your condition.  Will get help right away if you are not doing well or get worse. Document Released: 12/06/2004 Document Revised: 01/21/2012 Document Reviewed: 07/02/2011 Exit[employer] Patient Information 2013 [employer].  

## 2013-01-14 NOTE — Assessment & Plan Note (Signed)
Has dropped her flovent to 2 puffs daily and is doing better with rinsing after dosing after having a recent episode of thrush. This was well treated with Diflucan.

## 2013-01-14 NOTE — Assessment & Plan Note (Signed)
Mild, continue statin. Encouraged krill oil

## 2013-01-14 NOTE — Assessment & Plan Note (Signed)
On hands, use Triamcinolone cream qhs

## 2013-01-14 NOTE — Assessment & Plan Note (Signed)
Resolved with blood draw. No changes

## 2013-01-21 ENCOUNTER — Ambulatory Visit (INDEPENDENT_AMBULATORY_CARE_PROVIDER_SITE_OTHER): Payer: Medicare Other | Admitting: Physician Assistant

## 2013-01-21 ENCOUNTER — Encounter: Payer: Self-pay | Admitting: Physician Assistant

## 2013-01-21 VITALS — BP 100/80 | HR 96 | Ht 66.0 in | Wt 131.0 lb

## 2013-01-21 NOTE — Patient Instructions (Addendum)
NO CHANGES IN MEDICATIONS WHEN YOU GET HOME TODAY PLEASE LOOK AT YOUR SINUS MEDICATION AND SEE IF IT HAS ANY DECONGESTANTS IN IT; PSEUDOEPHERINE OR PHENYLEPHRINE IF SO DO NOT TAKE THIS MEDICATION LOOK FOR ONE THAT DOES NOT HAVE DECONGESTANTS  Your physician has requested that you have an echocardiogram DX 785.1. Echocardiography is a painless test that uses sound waves to create images of your heart. It provides your doctor with information about the size and shape of your heart and how well your heart's chambers and valves are working. This procedure takes approximately one hour. There are no restrictions for this procedure.  Your physician has requested that you have a carotid duplex DX 433.10. This test is an ultrasound of the carotid arteries in your neck. It looks at blood flow through these arteries that supply the brain with blood. Allow one hour for this exam. There are no restrictions or special instructions.  Your physician has recommended that you wear an event monitor DX 785.1. Event monitors are medical devices that record the heart's electrical activity. Doctors most often [place] these monitors to diagnose arrhythmias. Arrhythmias are problems with the speed or rhythm of the heartbeat. The monitor is a small, portable device. You can wear one while you do your normal daily activities. This is usually used to diagnose what is causing palpitations/syncope (passing out).  PLEASE FOLLOW UP WITH DR. ROSS IN THE NEXT 4-6 WEEKS

## 2013-01-21 NOTE — Progress Notes (Signed)
[address] [place], [place]  [address] Phone: [phone], Fax:  [phone]  Date:  01/21/2013   ID:  Anne [name], Anne [name] 20, 1942, MRN [id number]  PCP:  [name], MD  Primary Cardiologist:  Dr. Dietrich Pates     History of Present Illness: Anne [name] is a 72 y.o. female who returns for the evaluation of palpitations.  She has a hx of carotid stenosis, HL, COPD, anemia, GERD, eosinophilic esophagitis, breast CA, ETOH abuse.  Dopplers 3/12: 0-39% bilateral ICA.  F/u due in 01/2013. Last seen by Dr. Tenny Craw in 6/12.  She has noted increased heart rates and irregularity over the last 6 weeks.  She feels lightheaded sometimes.  Has a long hx of orthostatic intolerance.  Feels like this his worsened somewhat.  No syncope.  No chest pain.  No dyspnea.  No orthopnea, PND, edema.    Labs (3/14):  K 4, creatinine 0.8, ALT 16, LDL 117, Hgb 12.5, TSH 0.61   Wt Readings from Last 3 Encounters:  01/14/13 129 lb 12.8 oz (58.877 kg)  01/01/13 130 lb (58.968 kg)  12/30/12 131 lb 9.6 oz (59.693 kg)     Past Medical History  Diagnosis Date  . Anemia   . COPD (chronic obstructive pulmonary disease)   . Hyperlipidemia   . Pneumonia 2-10    pleurisy  . Arthritis   . Depression   . GERD (gastroesophageal reflux disease) 09/29/2009    improved s/p cholecystectomy and esophagus dilatation  . History of chicken pox   . History of shingles     2 episodes  . History of measles   . Osteopenia 03/14/2011  . 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 Pure hypercholesterolemia 10/17/2010  . 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 ESOPHAGEAL STRICTURE 03/29/2009  . CAROTID ARTERY STENOSIS 01/13/2010  . ALKALINE PHOSPHATASE, ELEVATED 03/15/2009  . Anxiety and depression 04/28/2011  . Vaginitis 05/22/2011  . Baker's cyst of knee 05/22/2011  . Trauma 10/18/2011  . Radial neck fracture 10/2011    minimally displaced  . Atypical chest pain 11/30/2011  .  Folliculitis of nose 12/31/2011  . Esophageal ring   . 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 Urinary incontinence 03/19/2012  . Allergic state 06/10/2012  . Hematuria   . Dysphagia   . URI (upper respiratory infection) 09/02/2012  . Preventative health care 10/21/2012  . Dermatitis 11/23/2012  . Fall 11/23/2012    Current Outpatient Prescriptions  Medication Sig Dispense Refill  . amitriptyline (ELAVIL) 150 MG tablet Take 1 tablet (150 mg total) by mouth at bedtime.  2 tablet  0  . anastrozole (ARIMIDEX) 1 MG tablet Take 1 tablet (1 mg total) by mouth daily.  90 tablet  1  . aspirin 81 MG tablet Take 81 mg by mouth daily.        [name] atorvastatin (LIPITOR) 40 MG tablet Take 1 tablet (40 mg total) by mouth daily.  90 tablet  3  . beta carotene 44034 UNIT capsule Take 25,000 Units by mouth daily.        . calcium carbonate (TUMS - DOSED IN MG ELEMENTAL CALCIUM) 500 MG chewable tablet Chew 750 mg by mouth 2 (two) times daily.       . Cholecalciferol (VITAMIN D3) 1000 UNITS CAPS Take 2 tablets by mouth daily.        [name] FERRETTS 325 (106 FE) MG TABS  take 1 tablet by mouth once daily  30 tablet  4  . fluticasone (FLOVENT HFA) 220 MCG/ACT inhaler Inhale 4 puffs into the lungs 2 (two) times daily. swallow  1 Inhaler  12  . LORazepam (ATIVAN) 1 MG tablet Take 1 tablet (1 mg total) by mouth every 6 (six) hours as needed for anxiety (x 10 days & then drop to tid x 7 days then back to bid).  90 tablet  1  . pantoprazole (PROTONIX) 40 MG tablet Take 1 tablet (40 mg total) by mouth 2 (two) times daily before a meal. 30-60 minutes before breakfast and supper  180 tablet  3  . Probiotic Product (PROBIOTIC FORMULA PO) Take 1 capsule by mouth daily.      . vitamin B-12 (CYANOCOBALAMIN) 1000 MCG tablet Take 1,000 mcg by mouth daily.         No current facility-administered medications for this visit.    Allergies:    Allergies  Allergen Reactions  . Codeine Other (See Comments)    "flu  like symptoms"  . Diphenhydramine Hcl Other (See Comments)    "nervous and upset"  . Morphine Other (See Comments)    "flu like symptoms"  . Peanut-Containing Drug Products Hives  . Zofran Other (See Comments)    HEADACHE    Social History:  The patient  reports that she quit smoking about 5 years ago. Her smoking use included Cigarettes. She smoked 0.00 packs per day. She has never used smokeless tobacco. She reports that she drinks about 0.6 ounces of alcohol per week. She reports that she does not use illicit drugs.   ROS:  Please see the history of present illness.   She has chronic dysphagia and chronic diarrhea.   All other systems reviewed and negative.   PHYSICAL EXAM: VS:  BP 100/80  Pulse 96  Ht 5\' 6"  (1.676 m)  Wt 131 lb (59.421 kg)  BMI 21.15 kg/m2 Well nourished, well developed, in no acute distress HEENT: normal Neck: no JVD Cardiac:  normal S1, S2; RRR; no murmur Lungs:  clear to auscultation bilaterally, no wheezing, rhonchi or rales Abd: soft, nontender, no hepatomegaly Ext: no edema Skin: warm and dry Neuro:  CNs 2-12 intact, no focal abnormalities noted  EKG:  NSR, HR 96, normal axis, IVCD, no change from prior tracing  ASSESSMENT AND PLAN:  1. Palpitations:  Symptoms somewhat suggestive of AFib.  She has problems with ETOH abuse.  She does admit to snoring and daytime hypersomnolence.  I will arrange an Echocardiogram and set her up with an event monitor.  She will make sure she is not taking any decongestants (just started taking a "sinus medication").   2. Snoring:  Consider arranging a sleep study at follow up. 3. Carotid Stenosis:  Arrange follow up dopplers. 4. Hyperlipidemia:  LDL somewhat higher recently.  Continue Lipitor. 5. Alcohol Abuse:  We discussed different programs for treatment. 6. Disposition:  Follow up with Dr. Dietrich Pates in 4-6 weeks.   Signed, Tereso Newcomer, PA-C  2:33 PM 01/21/2013

## 2013-01-22 ENCOUNTER — Telehealth: Payer: Self-pay

## 2013-01-22 NOTE — Telephone Encounter (Signed)
Pt called stating that she has been drinking a lot lately and her husband and kids are really giving her a hard time. Pt doesn't want to do a DTOX program but will do some kind of behavior/counseling. I informed pt I would pass this on to MD and get back with her tomorrow. Please advise?

## 2013-01-22 NOTE — Telephone Encounter (Signed)
I believe some of the folks in [facility] covers addiction, have her call them and arrange care.

## 2013-01-23 ENCOUNTER — Other Ambulatory Visit: Payer: Self-pay

## 2013-01-23 MED ORDER — ANASTROZOLE 1 MG PO TABS: 1.0000 mg | ORAL_TABLET | Freq: Every day | ORAL | Status: DC

## 2013-01-23 NOTE — Telephone Encounter (Signed)
I left a message for patient to return my call. Pt can call Behavioral Health at [phone] to make an appt

## 2013-01-23 NOTE — Telephone Encounter (Signed)
FYI: Spoke with patient and inform of provider instructions, gave [facility] phone [[phone]] & address to patient; pt states she knows where they are located and will call them for addiction counseling. Pt advised to call our office back if we can be of any further assistance/SLS

## 2013-01-27 ENCOUNTER — Ambulatory Visit (HOSPITAL_COMMUNITY): Payer: Medicare Other | Attending: Cardiology

## 2013-01-27 ENCOUNTER — Ambulatory Visit (INDEPENDENT_AMBULATORY_CARE_PROVIDER_SITE_OTHER): Payer: Medicare Other

## 2013-01-27 DIAGNOSIS — I079 Rheumatic tricuspid valve disease, unspecified: Secondary | ICD-10-CM | POA: Insufficient documentation

## 2013-01-27 DIAGNOSIS — E785 Hyperlipidemia, unspecified: Secondary | ICD-10-CM | POA: Insufficient documentation

## 2013-01-27 DIAGNOSIS — J4489 Other specified chronic obstructive pulmonary disease: Secondary | ICD-10-CM | POA: Insufficient documentation

## 2013-01-27 DIAGNOSIS — R002 Palpitations: Secondary | ICD-10-CM | POA: Insufficient documentation

## 2013-01-27 DIAGNOSIS — J449 Chronic obstructive pulmonary disease, unspecified: Secondary | ICD-10-CM | POA: Insufficient documentation

## 2013-01-27 DIAGNOSIS — I059 Rheumatic mitral valve disease, unspecified: Secondary | ICD-10-CM | POA: Insufficient documentation

## 2013-01-27 NOTE — Progress Notes (Signed)
Placed a 30 day event monitor on patient and went over instructions on how to use and when to return it

## 2013-01-27 NOTE — Progress Notes (Signed)
Echocardiogram performed.  

## 2013-01-28 ENCOUNTER — Telehealth: Payer: Self-pay | Admitting: *Deleted

## 2013-01-28 ENCOUNTER — Encounter: Payer: Self-pay | Admitting: Physician Assistant

## 2013-01-28 NOTE — Telephone Encounter (Signed)
Message copied by Tarri Fuller on Wed Jan 28, 2013 10:41 AM ------      Message from: [place], [place] T      Created: Wed Jan 28, 2013  8:52 AM       Echo ok with      Normal LV function and just mild diastolic dysfunction      Tereso Newcomer, PA-C 01/28/2013 8:51 AM ------

## 2013-01-28 NOTE — Telephone Encounter (Signed)
lmptcb to go over echo results  

## 2013-01-28 NOTE — Telephone Encounter (Signed)
Patient states she has cut down to 4 oz of liquor a night and 1 beer.

## 2013-01-28 NOTE — Telephone Encounter (Signed)
Pt advised of Echo results, she verbalized understanding.

## 2013-01-28 NOTE — Telephone Encounter (Signed)
Follow up call    Test results

## 2013-02-02 ENCOUNTER — Telehealth: Payer: Self-pay | Admitting: Internal Medicine

## 2013-02-02 NOTE — Telephone Encounter (Signed)
Pt calling re echo was normal, why did she get a heart monitor? pls advise

## 2013-02-02 NOTE — Telephone Encounter (Signed)
Pt wanted to clarify if he still needed to wear the heart monitor, I said yes and she she said ok.

## 2013-02-06 ENCOUNTER — Encounter (INDEPENDENT_AMBULATORY_CARE_PROVIDER_SITE_OTHER): Payer: Medicare Other

## 2013-02-06 DIAGNOSIS — I6529 Occlusion and stenosis of unspecified carotid artery: Secondary | ICD-10-CM

## 2013-02-09 ENCOUNTER — Telehealth: Payer: Self-pay | Admitting: Internal Medicine

## 2013-02-09 NOTE — Telephone Encounter (Signed)
Pt advised to continue wearing monitor and not to make medication changes without checking with the prescriber (Dr. Leone Payor) first.  Pt agreed to plan.

## 2013-02-09 NOTE — Telephone Encounter (Signed)
See previous note

## 2013-02-09 NOTE — Telephone Encounter (Signed)
New problem   Pt has irregular heartbeat and thinks that her medication Flovant is causing her irregular heartbeat. Please call pt concerning this matter.

## 2013-02-10 ENCOUNTER — Telehealth: Payer: Self-pay | Admitting: Internal Medicine

## 2013-02-10 NOTE — Telephone Encounter (Signed)
Spoke with patient and gave her Dr. Marvell Fuller answer.

## 2013-02-10 NOTE — Telephone Encounter (Signed)
Patient calling to report irregular heart beat. Her PCP referred her to Dr. Tenny Craw. She has had a negative echo and is wearing a monitor for a month. She is wondering if the Flovent could be causing this and if it is what she should do. Please, advise.

## 2013-02-10 NOTE — Telephone Encounter (Signed)
I do not think it is the Flovent She can ask cardiologist also

## 2013-02-12 NOTE — Telephone Encounter (Signed)
error 

## 2013-02-15 ENCOUNTER — Other Ambulatory Visit: Payer: Self-pay | Admitting: Family Medicine

## 2013-02-16 NOTE — Telephone Encounter (Signed)
Amitriptyline request [last Rx 02.05.14 #2x0]/SLS Please advise.

## 2013-02-25 ENCOUNTER — Telehealth: Payer: Self-pay

## 2013-02-25 NOTE — Telephone Encounter (Signed)
Received monitor strips from [facility] which revealed bigeminy pvc's.Patient has appointment with Dr.Ross 03/05/13.Montior strips were shown to DOD Dr.Stuckey and he advised keep appointment with Dr.Ross 03/05/13.Patient advised to decrease caffeine.

## 2013-02-26 ENCOUNTER — Telehealth: Payer: Self-pay | Admitting: Internal Medicine

## 2013-02-26 NOTE — Telephone Encounter (Signed)
New Problem:   Patient's husband called in wanting someone form our office to call his wife because she is complaining of extreme fatigue and chest pressure.  After speaking with Elnita Maxwell on 02/25/13 these two new symptoms arose and husband is concerned.  Please call back.

## 2013-02-26 NOTE — Telephone Encounter (Signed)
Reassurance given to pt that echo was normal. Not sure why the pt is feeling bad. She will make sure she is drinking plenty of fluids. She will call her primary care md.

## 2013-02-26 NOTE — Telephone Encounter (Signed)
Spoke with pt, she reports she feels bad today. She has no energy and states she feels breathless. She reports it is taking all her breath to talk but she does not sound SOB and is not pausing to breathe while talking to me. She denies edema or trouble lying down to sleep last night. She has felt bad for several days but the breathlessness is a new symptom today. She reports her bp was normal today and her pulse was 64. Her palpitations have not changed, her heart is not racing. She states to me that she feels like she is in a daze. She is taking no new medicine.

## 2013-03-05 ENCOUNTER — Ambulatory Visit (INDEPENDENT_AMBULATORY_CARE_PROVIDER_SITE_OTHER): Payer: Medicare Other | Admitting: Internal Medicine

## 2013-03-05 VITALS — BP 104/68 | HR 63 | Ht 66.0 in | Wt 129.8 lb

## 2013-03-05 DIAGNOSIS — R079 Chest pain, unspecified: Secondary | ICD-10-CM

## 2013-03-05 NOTE — Patient Instructions (Addendum)
Your physician has requested that you have a lexiscan myoview. For further information please visit [email or web address]. Please follow instruction sheet, as given.   

## 2013-03-05 NOTE — Progress Notes (Signed)
HPI Ptient is a 72 yo with a hstory of palptiations, CV disease, HL, COPD, anemia, EtOH abuse, GERD, eosinophic esophagitis, breast CA, orthostatic intolerance She was recently seen by Wende Mott.  She complained of some palpitations.  Associated with dizziness. She was set up for an echo which was normal.  She also wore an event monitor  This showed extensive PVCs (isolated, bigeminy) SInce seen she continues to have the palpitations.  Denies CP  But has some chest tightness with skips  Allergies  Allergen Reactions  . Codeine Other (See Comments)    "flu like symptoms"  . Diphenhydramine Hcl Other (See Comments)    "nervous and upset"  . Morphine Other (See Comments)    "flu like symptoms"  . Peanut-Containing Drug Products Hives  . Zofran Other (See Comments)    HEADACHE    Current Outpatient Prescriptions  Medication Sig Dispense Refill  . amitriptyline (ELAVIL) 150 MG tablet 1TY BY MOUTH AT BEDTIME  30 tablet  3  . anastrozole (ARIMIDEX) 1 MG tablet Take 1 tablet (1 mg total) by mouth daily.  90 tablet  2  . aspirin 81 MG tablet Take 81 mg by mouth daily.        [name] atorvastatin (LIPITOR) 40 MG tablet Take 1 tablet (40 mg total) by mouth daily.  90 tablet  3  . beta carotene 16109 UNIT capsule Take 25,000 Units by mouth daily.        . calcium carbonate (TUMS - DOSED IN MG ELEMENTAL CALCIUM) 500 MG chewable tablet Chew 750 mg by mouth 2 (two) times daily.       . Cholecalciferol (VITAMIN D3) 1000 UNITS CAPS Take 2 tablets by mouth daily.        [name] FERRETTS 325 (106 FE) MG TABS take 1 tablet by mouth once daily  30 tablet  4  . fluticasone (FLOVENT HFA) 220 MCG/ACT inhaler Inhale 4 puffs into the lungs 2 (two) times daily. swallow  1 Inhaler  12  . LORazepam (ATIVAN) 1 MG tablet Take 1 tablet (1 mg total) by mouth every 6 (six) hours as needed for anxiety (x 10 days & then drop to tid x 7 days then back to bid).  90 tablet  1  . pantoprazole (PROTONIX) 40 MG tablet Take 1 tablet (40 mg  total) by mouth 2 (two) times daily before a meal. 30-60 minutes before breakfast and supper  180 tablet  3  . Probiotic Product (PROBIOTIC FORMULA PO) Take 1 capsule by mouth daily.      . vitamin B-12 (CYANOCOBALAMIN) 1000 MCG tablet Take 1,000 mcg by mouth daily.         No current facility-administered medications for this visit.    Past Medical History  Diagnosis Date  . Anemia   . COPD (chronic obstructive pulmonary disease)   . Hyperlipidemia   . Pneumonia 2-10    pleurisy  . Arthritis   . Depression   . GERD (gastroesophageal reflux disease) 09/29/2009    improved s/p cholecystectomy and esophagus dilatation  . History of chicken pox   . History of shingles     2 episodes  . History of measles   . Osteopenia 03/14/2011  . 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 Pure hypercholesterolemia 10/17/2010  . 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 ESOPHAGEAL STRICTURE 03/29/2009  . CAROTID ARTERY STENOSIS 01/13/2010  . ALKALINE PHOSPHATASE, ELEVATED 03/15/2009  . Anxiety and depression 04/28/2011  .  Vaginitis 05/22/2011  . Baker's cyst of knee 05/22/2011  . Trauma 10/18/2011  . Radial neck fracture 10/2011    minimally displaced  . Atypical chest pain 11/30/2011  . Folliculitis of nose 12/31/2011  . Esophageal ring   . 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 Urinary incontinence 03/19/2012  . Allergic state 06/10/2012  . Hematuria   . Dysphagia   . URI (upper respiratory infection) 09/02/2012  . Preventative health care 10/21/2012  . Dermatitis 11/23/2012  . Fall 11/23/2012  . Hx of echocardiogram     a. Echo 01/2103: mild LVH, EF 55-60%, normal wall motion, Gr 1 diast dysfn    Past Surgical History  Procedure Laterality Date  . Anterior cruciate ligament repair  08, 09, 10  . Ercp, cbd stone extraction  2010  . Cholecystectomy  2010  . Egd/ dili  2010, 2012  . Breast surgery  2001    lumpectomy (01)  . Colonoscopy  09/05/10    cecal  avm's  . Esophagogastroduodenoscopy  01/08/2012    Procedure: ESOPHAGOGASTRODUODENOSCOPY (EGD);  Surgeon: [name], MD;  Location: [facility];  Service: Endoscopy;  Laterality: N/A;  . Savory dilation  01/08/2012    Procedure: SAVORY DILATION;  Surgeon: [name], MD;  Location: [facility];  Service: Endoscopy;  Laterality: N/A;  need xray  . Mastectomy modified radical      , Mastectomy modified radical (08), breast reconstruction, CA lesions excised lateral abd wall 2010  . Breast reconstructive surgery  2008, 2009, 2010    Family History  Problem Relation Age of Onset  . Heart disease Father   . Lung cancer Father     smoker  . Cirrhosis Sister     Primary Biliary  . Breast cancer    . Stroke Maternal Grandmother   . Alcohol abuse Maternal Grandfather   . Heart disease Paternal Grandfather   . Anxiety disorder Sister   . Osteoporosis Sister   . Arthritis Sister     Rheumatoid  . Skin cancer Sister   . Osteoporosis Sister   . Cancer Sister     multiple skin cancers, over 90 excisions.  . Other Mother     tic deleauroux  . Anesthesia problems Neg Hx   . Hypotension Neg Hx   . Malignant hyperthermia Neg Hx   . Pseudochol deficiency Neg Hx   . Colon cancer Neg Hx   . Ovarian cancer    . Pancreatic cancer      History   Social History  . Marital Status: Married    Spouse Name: N/A    Number of Children: N/A  . Years of Education: N/A   Occupational History  . Not on file.   Social History Main Topics  . Smoking status: Former Smoker    Types: Cigarettes    Quit date: 05/22/2007  . Smokeless tobacco: Never Used  . Alcohol Use: 0.6 oz/week    1 Glasses of wine per week     Comment: 6 oz daily  . Drug Use: No  . Sexually Active: Yes -- Female partner(s)   Other Topics Concern  . Not on file   Social History Narrative  . No narrative on file    Review of Systems:  All systems reviewed.  They are negative to the above problem except as  previously stated.  Vital Signs: BP 104/68  Pulse 63  Ht 5\' 6"  (1.676 m)  Wt 129 lb  12.8 oz (58.877 kg)  BMI 20.96 kg/m2  Physical Exam Patient is a thin 72 yo in NAD HEENT:  Normocephalic, atraumatic. EOMI, PERRLA.  Neck: JVP is normal.  No bruits.  Lungs: clear to auscultation. No rales no wheezes.  Heart: Regular rate and rhythm with frequent skip. Normal S1, S2. No S3.   No significant murmurs. PMI not displaced.  Abdomen:  Supple, nontender. Normal bowel sounds. No masses. No hepatomegaly.  Extremities:   Good distal pulses throughout. No lower extremity edema.  Musculoskeletal :moving all extremities.  Neuro:   alert and oriented x3.  CN II-XII grossly intact.   Assessment and Plan:  1.  Palpitations  PVCs on monitor.  No afib.  Will set up for lexiscan myoview to rule out signif ischemia  2.  CV disease.  Follow  3.  HL  Keep on statin.    4.  EtOH abuse.  Admits to drinking only 1 beer per day.

## 2013-03-10 ENCOUNTER — Ambulatory Visit (HOSPITAL_COMMUNITY): Payer: Medicare Other | Attending: Cardiology | Admitting: Radiology

## 2013-03-10 VITALS — BP 107/81 | HR 80 | Ht 66.0 in | Wt 132.0 lb

## 2013-03-10 DIAGNOSIS — R0602 Shortness of breath: Secondary | ICD-10-CM

## 2013-03-10 DIAGNOSIS — R000 Tachycardia, unspecified: Secondary | ICD-10-CM | POA: Insufficient documentation

## 2013-03-10 DIAGNOSIS — I779 Disorder of arteries and arterioles, unspecified: Secondary | ICD-10-CM | POA: Insufficient documentation

## 2013-03-10 DIAGNOSIS — R42 Dizziness and giddiness: Secondary | ICD-10-CM | POA: Insufficient documentation

## 2013-03-10 DIAGNOSIS — R002 Palpitations: Secondary | ICD-10-CM | POA: Insufficient documentation

## 2013-03-10 DIAGNOSIS — R0789 Other chest pain: Secondary | ICD-10-CM | POA: Insufficient documentation

## 2013-03-10 DIAGNOSIS — Z8249 Family history of ischemic heart disease and other diseases of the circulatory system: Secondary | ICD-10-CM | POA: Insufficient documentation

## 2013-03-10 DIAGNOSIS — R55 Syncope and collapse: Secondary | ICD-10-CM | POA: Insufficient documentation

## 2013-03-10 DIAGNOSIS — E785 Hyperlipidemia, unspecified: Secondary | ICD-10-CM | POA: Insufficient documentation

## 2013-03-10 DIAGNOSIS — Z9221 Personal history of antineoplastic chemotherapy: Secondary | ICD-10-CM | POA: Insufficient documentation

## 2013-03-10 DIAGNOSIS — Z87891 Personal history of nicotine dependence: Secondary | ICD-10-CM | POA: Insufficient documentation

## 2013-03-10 DIAGNOSIS — R0989 Other specified symptoms and signs involving the circulatory and respiratory systems: Secondary | ICD-10-CM | POA: Insufficient documentation

## 2013-03-10 DIAGNOSIS — R0609 Other forms of dyspnea: Secondary | ICD-10-CM | POA: Insufficient documentation

## 2013-03-10 DIAGNOSIS — R079 Chest pain, unspecified: Secondary | ICD-10-CM

## 2013-03-10 DIAGNOSIS — I4949 Other premature depolarization: Secondary | ICD-10-CM | POA: Insufficient documentation

## 2013-03-10 MED ORDER — REGADENOSON 0.4 MG/5ML IV SOLN
0.4000 mg | Freq: Once | INTRAVENOUS | Status: AC
  Administered 2013-03-10: 0.4 mg via INTRAVENOUS

## 2013-03-10 MED ORDER — TECHNETIUM TC 99M SESTAMIBI GENERIC - CARDIOLITE
33.0000 | Freq: Once | INTRAVENOUS | Status: AC | PRN
  Administered 2013-03-10: 33 via INTRAVENOUS

## 2013-03-10 MED ORDER — TECHNETIUM TC 99M SESTAMIBI GENERIC - CARDIOLITE
11.0000 | Freq: Once | INTRAVENOUS | Status: AC | PRN
  Administered 2013-03-10: 11 via INTRAVENOUS

## 2013-03-10 NOTE — Progress Notes (Signed)
[facility] SITE 3 NUCLEAR MED [address] [place], [place] [address] [phone]    Cardiology Nuclear Med Study  Anne [name] is a 72 y.o. female     MRN : [id number]     DOB: 1941/08/25  Procedure Date: 03/10/2013  Nuclear Med Background Indication for Stress Test:  Evaluation for Ischemia History:  3/14 Echo:EF=60%; h/o Chemo Cardiac Risk Factors: Carotid Disease, Family History - CAD, History of Smoking and Lipids  Symptoms:  Chest Tightness with PVC's (last episode of chest discomfort now, 5/10), Dizziness, DOE, Near Syncope, Palpitations and Rapid HR   Nuclear Pre-Procedure Caffeine/Decaff Intake:  None > 12 hrs NPO After: 7:30am   Lungs:  Clear. O2 Sat: 99% on room air. IV 0.9% NS with Angio Cath:  22g  IV Site: L Wrist x 1, tolerated well IV Started by:  Irean Hong, RN  Chest Size (in):  32 Cup Size: D  Height: 5\' 6"  (1.676 m)  Weight:  132 lb (59.875 kg)  BMI:  Body mass index is 21.32 kg/(m^2). Tech Comments:  n/a    Nuclear Med Study 1 or 2 day study: 1 day  Stress Test Type:  Lexiscan  Reading MD: Willa Rough, MD  Order Authorizing Provider:  Dietrich Pates, MD  Resting Radionuclide: Technetium 35m Sestamibi  Resting Radionuclide Dose: 11.0 mCi   Stress Radionuclide:  Technetium 60m Sestamibi  Stress Radionuclide Dose: 33.0 mCi           Stress Protocol Rest HR: 80 Stress HR: 87  Rest BP: 107/81 Stress BP: 75/51  Exercise Time (min): n/a METS: n/a   Predicted Max HR: 150 bpm % Max HR: 58 bpm Rate Pressure Product: 6525   Dose of Adenosine (mg):  n/a Dose of Lexiscan: 0.4 mg  Dose of Atropine (mg): n/a Dose of Dobutamine: n/a mcg/kg/min (at max HR)  Stress Test Technologist: Smiley Houseman, CMA-N  Nuclear Technologist:  Domenic Polite, CNMT     Rest Procedure:  Myocardial perfusion imaging was performed at rest 45 minutes following the intravenous administration of Technetium 15m Sestamibi.  Rest ECG: Normal sinus rhythm. Many  scattered PVCs  Stress Procedure:  The patient received IV Lexiscan 0.4 mg over 15-seconds.  Technetium 62m Sestamibi injected at 30-seconds.  Quantitative spect images were obtained after a 45 minute delay.  Stress ECG: No significant change from baseline ECG  QPS Raw Data Images:  Normal; no motion artifact; normal heart/lung ratio. Stress Images:  Normal homogeneous uptake in all areas of the myocardium. Rest Images:  Normal homogeneous uptake in all areas of the myocardium. Subtraction (SDS):  No evidence of ischemia. Transient Ischemic Dilatation (Normal <1.22):  1.25 Lung/Heart Ratio (Normal <0.45):  0.13  Quantitative Gated Spect Images QGS EDV:  n/a QGS ESV:  n/a  Impression Exercise Capacity:  Lexiscan with no exercise. BP Response:  Normal blood pressure response. Clinical Symptoms:  No significant symptoms noted. ECG Impression:  No significant ST segment change suggestive of ischemia. Comparison with Prior Nuclear Study: No previous nuclear study performed  Overall Impression:  The study could not be gated because of PVCs. Therefore wall motion cannot be assessed. The tomographic images reveal no significant abnormality. There is no scar or ischemia. This is a low risk scan.  LV Ejection Fraction: Study not gated.  LV Wall Motion:  Study not gated  Willa Rough, MD

## 2013-03-19 ENCOUNTER — Other Ambulatory Visit: Payer: Self-pay | Admitting: *Deleted

## 2013-03-19 ENCOUNTER — Telehealth: Payer: Self-pay | Admitting: Internal Medicine

## 2013-03-19 MED ORDER — METOPROLOL TARTRATE 25 MG PO TABS: 12.5000 mg | ORAL_TABLET | Freq: Two times a day (BID) | ORAL | Status: DC

## 2013-03-19 NOTE — Telephone Encounter (Signed)
Pt.notified

## 2013-03-19 NOTE — Telephone Encounter (Signed)
New problem ° ° ° °Pt calling about lab results. °

## 2013-03-24 ENCOUNTER — Encounter (HOSPITAL_COMMUNITY): Payer: Medicare Other

## 2013-03-27 ENCOUNTER — Ambulatory Visit (INDEPENDENT_AMBULATORY_CARE_PROVIDER_SITE_OTHER): Payer: BC Managed Care – HMO | Admitting: Nurse Practitioner

## 2013-03-27 ENCOUNTER — Encounter: Payer: Self-pay | Admitting: Nurse Practitioner

## 2013-03-27 ENCOUNTER — Telehealth: Payer: Self-pay | Admitting: Internal Medicine

## 2013-03-27 VITALS — BP 84/60 | HR 64 | Resp 14 | Wt 127.0 lb

## 2013-03-27 DIAGNOSIS — T887XXA Unspecified adverse effect of drug or medicament, initial encounter: Secondary | ICD-10-CM

## 2013-03-27 DIAGNOSIS — T148XXA Other injury of unspecified body region, initial encounter: Secondary | ICD-10-CM

## 2013-03-27 DIAGNOSIS — I499 Cardiac arrhythmia, unspecified: Secondary | ICD-10-CM

## 2013-03-27 DIAGNOSIS — T50905A Adverse effect of unspecified drugs, medicaments and biological substances, initial encounter: Secondary | ICD-10-CM

## 2013-03-27 NOTE — Patient Instructions (Signed)
Your blood pressure is low today. This is likely due to the combination of dehydration and taking metoprolol. I think if you will increase your fluids to 6 8oz glasses daily and eat at least 2 meals with a snack, you will feel better and be able to tolerate the metoprolol. Continue with your 1/4 tab for the next 2 days, then increase back to 1/2 tab if tolerated. YOU MUST STAY HYDRATED in order to feel your best while taking metoprolol. I will refer you back to cardiology for medication follow up.   Best fluid choices are colorless soda (sierra mist, 7-up, sprite, ginger ale), decaffeinated tea, juice, lemonade, or water. If you choose dark soda, caffeinated tea or beer, please drink a glass of water after each soda or beer in order to stay hydrated.  Use moist heat on neck & shoulder as discussed. Use advil as needed. Allow about 2 more weeks for muscle strain to heal  Pleasure to meet you today! Feel better!

## 2013-03-27 NOTE — Telephone Encounter (Addendum)
Pt having fatigue , weakness and dizziness , see ov note from 03-27-13 from layne weaver,  her pcp requesting sooner appt than July with ross

## 2013-03-27 NOTE — Telephone Encounter (Signed)
Pt was started on Lopressor 12.5mg  BID about 2-3 weeks ago for PVC's on a monitor report.  Pt called to report that she is stopping the Lopressor because it is making her too fatigued.

## 2013-03-27 NOTE — Telephone Encounter (Signed)
New problem   Pt would like to know what to do about the medication Metoprolol pt can't take it has prescribe. Please call pt

## 2013-03-27 NOTE — Progress Notes (Signed)
Subjective:     Anne [name] is a 72 y.o. female who presents for evaluation of fatigue. Symptoms began a week ago. The patient feels the fatigue began with: starting metoprolol. Symptoms of her fatigue have been change in appetite, feelings of depression, general malaise and feels weak & dizzy with position changes. Patient describes the following psychological symptoms: depression. Patient denies fever, GI blood loss and unusual rashes. Symptoms have gradually worsened and pt has lowered her metoprolol to 1/4 tab, but still feels weak. . Symptom severity: does not feel like doing anything. Decreased appetite: states has not eaten today & has only had coffee latte.. Previous visits for this problem: none. Also pt c/o L sided neck & shoulder pain after doing yard work several days ago..   The following portions of the patient's history were reviewed and updated as appropriate: allergies, current medications, past medical history, past social history, past surgical history and problem list.  Review of Systems Constitutional: positive for fatigue and dizzy with position changes, negative for chills, fevers and night sweats Respiratory: negative for asthma, cough, dyspnea on exertion, pleurisy/chest pain and wheezing Cardiovascular: positive for fatigue and irregular heart beat, negative for chest pain, chest pressure/discomfort, dyspnea, lower extremity edema and near-syncope Gastrointestinal: positive for loss of appetite, negative for abdominal pain, diarrhea, dyspepsia, dysphagia and vomiting Hematologic/lymphatic: positive for lymphadenopathy Musculoskeletal:positive for L sided neck & upper back pain across trapezius Behavioral/Psych: positive for decreased appetite, fatigue and feelings of depression "not wanting to get out of bed", negative for loss of interest in favorite activities, mood swings and sleep disturbance    Objective:    BP 84/60  Pulse 64  Resp 14  Wt 127 lb (57.607 kg)   BMI 20.51 kg/m2 General appearance: alert, cooperative, appears stated age and no distress Head: Normocephalic, without obvious abnormality, atraumatic Eyes: negative findings: lids and lashes normal and conjunctivae and sclerae normal Neck: no JVD, supple, symmetrical, trachea midline and thyroid not enlarged, symmetric, no tenderness/mass/nodules Lungs: clear to auscultation bilaterally Heart: regularly irregular rhythm Extremities: extremities normal, atraumatic, no cyanosis or edema Pulses: 2+ and symmetric Lymph nodes: Cervical adenopathy: L sided posterior node NT, moveable, approx 1cm    Assessment:    Fatigue r/t medication side effect and dehydration  Slightly hypotensive. Irregular pulse. Mild muscle strain of L trapezius and possibly sternocleidomastoid.   Plan:    Discussed diagnosis with patient. Reassured that serious underlying cause for the fatigue is very unlikely. Discussed lifestyle modification as means of resolving problem. will call cardiology for appointment early next week for medication follow-up.  See pt instructions for daily hydration. Hopefully this will help tolerate metoprolol and may help irreg HR. Spent over 30 minutes discussing how to hydrate, importance of regular meals, self-monitoring bp, slow position changes.

## 2013-03-30 NOTE — Telephone Encounter (Signed)
Appt moved to 04/16/2013 at 8:45am.

## 2013-03-31 ENCOUNTER — Telehealth: Payer: Self-pay | Admitting: Nurse Practitioner

## 2013-03-31 NOTE — Telephone Encounter (Signed)
See telephone note.

## 2013-04-02 ENCOUNTER — Telehealth: Payer: Self-pay | Admitting: Internal Medicine

## 2013-04-02 NOTE — Telephone Encounter (Signed)
New problem   Pt had an episode today that she was walking and just suddenly fainted. Pt has had diarrhea since yesterday. She need to talk to nurse about this matter.

## 2013-04-02 NOTE — Telephone Encounter (Signed)
Follow Up ° ° ° ° ° °Pt calling in returning phone call from earlier. Please call. °

## 2013-04-02 NOTE — Telephone Encounter (Signed)
LMTCB

## 2013-04-02 NOTE — Telephone Encounter (Signed)
Pt did not faint.  Pt having a few episodes of diarrhea and weakness.  Diarrhea is now gone.  Advised pt to push po fluids.  Pt agreed.

## 2013-04-08 ENCOUNTER — Emergency Department (HOSPITAL_BASED_OUTPATIENT_CLINIC_OR_DEPARTMENT_OTHER): Payer: Medicare Other

## 2013-04-08 ENCOUNTER — Encounter (HOSPITAL_BASED_OUTPATIENT_CLINIC_OR_DEPARTMENT_OTHER): Payer: Self-pay | Admitting: Emergency Medicine

## 2013-04-08 ENCOUNTER — Emergency Department (HOSPITAL_BASED_OUTPATIENT_CLINIC_OR_DEPARTMENT_OTHER)
Admission: EM | Admit: 2013-04-08 | Discharge: 2013-04-08 | Disposition: A | Discharge status: Home / Self Care | Payer: Medicare Other | Attending: Emergency Medicine | Admitting: Emergency Medicine

## 2013-04-08 DIAGNOSIS — Z8719 Personal history of other diseases of the digestive system: Secondary | ICD-10-CM | POA: Insufficient documentation

## 2013-04-08 DIAGNOSIS — S4980XA Other specified injuries of shoulder and upper arm, unspecified arm, initial encounter: Secondary | ICD-10-CM | POA: Insufficient documentation

## 2013-04-08 DIAGNOSIS — Z8679 Personal history of other diseases of the circulatory system: Secondary | ICD-10-CM | POA: Insufficient documentation

## 2013-04-08 DIAGNOSIS — W010XXA Fall on same level from slipping, tripping and stumbling without subsequent striking against object, initial encounter: Secondary | ICD-10-CM | POA: Insufficient documentation

## 2013-04-08 DIAGNOSIS — Z859 Personal history of malignant neoplasm, unspecified: Secondary | ICD-10-CM | POA: Insufficient documentation

## 2013-04-08 DIAGNOSIS — S60512A Abrasion of left hand, initial encounter: Secondary | ICD-10-CM

## 2013-04-08 DIAGNOSIS — Z8742 Personal history of other diseases of the female genital tract: Secondary | ICD-10-CM | POA: Insufficient documentation

## 2013-04-08 DIAGNOSIS — S8000XA Contusion of unspecified knee, initial encounter: Secondary | ICD-10-CM | POA: Insufficient documentation

## 2013-04-08 DIAGNOSIS — Z8781 Personal history of (healed) traumatic fracture: Secondary | ICD-10-CM | POA: Insufficient documentation

## 2013-04-08 DIAGNOSIS — Z87891 Personal history of nicotine dependence: Secondary | ICD-10-CM | POA: Insufficient documentation

## 2013-04-08 DIAGNOSIS — J449 Chronic obstructive pulmonary disease, unspecified: Secondary | ICD-10-CM | POA: Insufficient documentation

## 2013-04-08 DIAGNOSIS — Z8709 Personal history of other diseases of the respiratory system: Secondary | ICD-10-CM | POA: Insufficient documentation

## 2013-04-08 DIAGNOSIS — Z8701 Personal history of pneumonia (recurrent): Secondary | ICD-10-CM | POA: Insufficient documentation

## 2013-04-08 DIAGNOSIS — Z872 Personal history of diseases of the skin and subcutaneous tissue: Secondary | ICD-10-CM | POA: Insufficient documentation

## 2013-04-08 DIAGNOSIS — Z87828 Personal history of other (healed) physical injury and trauma: Secondary | ICD-10-CM | POA: Insufficient documentation

## 2013-04-08 DIAGNOSIS — Z8739 Personal history of other diseases of the musculoskeletal system and connective tissue: Secondary | ICD-10-CM | POA: Insufficient documentation

## 2013-04-08 DIAGNOSIS — T07XXXA Unspecified multiple injuries, initial encounter: Secondary | ICD-10-CM

## 2013-04-08 DIAGNOSIS — Z7982 Long term (current) use of aspirin: Secondary | ICD-10-CM | POA: Insufficient documentation

## 2013-04-08 DIAGNOSIS — Y92009 Unspecified place in unspecified non-institutional (private) residence as the place of occurrence of the external cause: Secondary | ICD-10-CM | POA: Insufficient documentation

## 2013-04-08 DIAGNOSIS — W1809XA Striking against other object with subsequent fall, initial encounter: Secondary | ICD-10-CM | POA: Insufficient documentation

## 2013-04-08 DIAGNOSIS — Z8619 Personal history of other infectious and parasitic diseases: Secondary | ICD-10-CM | POA: Insufficient documentation

## 2013-04-08 DIAGNOSIS — E7800 Pure hypercholesterolemia, unspecified: Secondary | ICD-10-CM | POA: Insufficient documentation

## 2013-04-08 DIAGNOSIS — Z79899 Other long term (current) drug therapy: Secondary | ICD-10-CM | POA: Insufficient documentation

## 2013-04-08 DIAGNOSIS — M129 Arthropathy, unspecified: Secondary | ICD-10-CM | POA: Insufficient documentation

## 2013-04-08 DIAGNOSIS — S46909A Unspecified injury of unspecified muscle, fascia and tendon at shoulder and upper arm level, unspecified arm, initial encounter: Secondary | ICD-10-CM | POA: Insufficient documentation

## 2013-04-08 DIAGNOSIS — Y9389 Activity, other specified: Secondary | ICD-10-CM | POA: Insufficient documentation

## 2013-04-08 DIAGNOSIS — Z87448 Personal history of other diseases of urinary system: Secondary | ICD-10-CM | POA: Insufficient documentation

## 2013-04-08 DIAGNOSIS — IMO0002 Reserved for concepts with insufficient information to code with codable children: Secondary | ICD-10-CM | POA: Insufficient documentation

## 2013-04-08 DIAGNOSIS — K219 Gastro-esophageal reflux disease without esophagitis: Secondary | ICD-10-CM | POA: Insufficient documentation

## 2013-04-08 DIAGNOSIS — J4489 Other specified chronic obstructive pulmonary disease: Secondary | ICD-10-CM | POA: Insufficient documentation

## 2013-04-08 DIAGNOSIS — Q2733 Arteriovenous malformation of digestive system vessel: Secondary | ICD-10-CM | POA: Insufficient documentation

## 2013-04-08 DIAGNOSIS — Z853 Personal history of malignant neoplasm of breast: Secondary | ICD-10-CM | POA: Insufficient documentation

## 2013-04-08 DIAGNOSIS — D649 Anemia, unspecified: Secondary | ICD-10-CM | POA: Insufficient documentation

## 2013-04-08 DIAGNOSIS — F341 Dysthymic disorder: Secondary | ICD-10-CM | POA: Insufficient documentation

## 2013-04-08 DIAGNOSIS — E785 Hyperlipidemia, unspecified: Secondary | ICD-10-CM | POA: Insufficient documentation

## 2013-04-08 MED ORDER — TETANUS-DIPHTH-ACELL PERTUSSIS 5-2.5-18.5 LF-MCG/0.5 IM SUSP
0.5000 mL | Freq: Once | INTRAMUSCULAR | Status: AC
  Administered 2013-04-08: 0.5 mL via INTRAMUSCULAR
  Filled 2013-04-08: qty 0.5

## 2013-04-08 MED ORDER — IBUPROFEN 800 MG PO TABS: 800.0000 mg | ORAL_TABLET | Freq: Three times a day (TID) | ORAL | Status: DC

## 2013-04-08 NOTE — ED Notes (Signed)
Pt tripped over unknown object at home, hitting right knee and left hand.  Abrasion to base of left thumb.

## 2013-04-08 NOTE — ED Provider Notes (Signed)
History     CSN: [id number]  Arrival date & time 04/08/13  2037   First MD Initiated Contact with Patient 04/08/13 2230      Chief Complaint  Patient presents with  . Fall  . Hand Injury    (Consider location/radiation/quality/duration/timing/severity/associated sxs/prior treatment) HPI Comments: Mechanical fall at home. Tripped on something on the ground. Remodeling house and it is cluttered.  Landed on L hand and thumb.  Denies hitting head or LOC.  No head, neck, back, abdominal, chest pain.  No dizziness or lightheadedness. C/o pain in L thumb, R knee, L shoulder.  The history is provided by the spouse and the patient.    Past Medical History  Diagnosis Date  . Anemia   . COPD (chronic obstructive pulmonary disease)   . Hyperlipidemia   . Pneumonia 2-10    pleurisy  . Arthritis   . Depression   . GERD (gastroesophageal reflux disease) 09/29/2009    improved s/p cholecystectomy and esophagus dilatation  . History of chicken pox   . History of shingles     2 episodes  . History of measles   . Osteopenia 03/14/2011  . 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 Pure hypercholesterolemia 10/17/2010  . 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 ESOPHAGEAL STRICTURE 03/29/2009  . CAROTID ARTERY STENOSIS 01/13/2010  . ALKALINE PHOSPHATASE, ELEVATED 03/15/2009  . Anxiety and depression 04/28/2011  . Vaginitis 05/22/2011  . Baker's cyst of knee 05/22/2011  . Trauma 10/18/2011  . Radial neck fracture 10/2011    minimally displaced  . Atypical chest pain 11/30/2011  . Folliculitis of nose 12/31/2011  . Esophageal ring   . 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 Urinary incontinence 03/19/2012  . Allergic state 06/10/2012  . Hematuria   . Dysphagia   . URI (upper respiratory infection) 09/02/2012  . Preventative health care 10/21/2012  . Dermatitis 11/23/2012  . Fall 11/23/2012  . Hx of echocardiogram     a. Echo 01/2103: mild LVH, EF 55-60%, normal  wall motion, Gr 1 diast dysfn    Past Surgical History  Procedure Laterality Date  . Anterior cruciate ligament repair  08, 09, 10  . Ercp, cbd stone extraction  2010  . Cholecystectomy  2010  . Egd/ dili  2010, 2012  . Breast surgery  2001    lumpectomy (01)  . Colonoscopy  09/05/10    cecal avm's  . Esophagogastroduodenoscopy  01/08/2012    Procedure: ESOPHAGOGASTRODUODENOSCOPY (EGD);  Surgeon: [name], MD;  Location: [facility];  Service: Endoscopy;  Laterality: N/A;  . Savory dilation  01/08/2012    Procedure: SAVORY DILATION;  Surgeon: [name], MD;  Location: [facility];  Service: Endoscopy;  Laterality: N/A;  need xray  . Mastectomy modified radical      , Mastectomy modified radical (08), breast reconstruction, CA lesions excised lateral abd wall 2010  . Breast reconstructive surgery  2008, 2009, 2010    Family History  Problem Relation Age of Onset  . Heart disease Father   . Lung cancer Father     smoker  . Cirrhosis Sister     Primary Biliary  . Breast cancer    . Stroke Maternal Grandmother   . Alcohol abuse Maternal Grandfather   . Heart disease Paternal Grandfather   . Anxiety disorder Sister   . Osteoporosis Sister   . Arthritis Sister  Rheumatoid  . Skin cancer Sister   . Osteoporosis Sister   . Cancer Sister     multiple skin cancers, over 90 excisions.  . Other Mother     tic deleauroux  . Anesthesia problems Neg Hx   . Hypotension Neg Hx   . Malignant hyperthermia Neg Hx   . Pseudochol deficiency Neg Hx   . Colon cancer Neg Hx   . Ovarian cancer    . Pancreatic cancer      History  Substance Use Topics  . Smoking status: Former Smoker    Types: Cigarettes    Quit date: 05/22/2007  . Smokeless tobacco: Never Used  . Alcohol Use: 0.6 oz/week    1 Glasses of wine per week     Comment: 6 oz daily    OB History   Grav Para Term Preterm Abortions TAB SAB Ect Mult Living                  Review of Systems   Constitutional: Negative for activity change and appetite change.  HENT: Negative for neck pain.   Gastrointestinal: Negative for nausea, vomiting and abdominal pain.  Genitourinary: Negative for dysuria and hematuria.  Musculoskeletal: Positive for myalgias and arthralgias. Negative for back pain.  Skin: Negative for rash.  Neurological: Negative for dizziness and headaches.  A complete 10 system review of systems was obtained and all systems are negative except as noted in the HPI and PMH.    Allergies  Codeine; Diphenhydramine hcl; Morphine; Peanut-containing drug products; and Zofran  Home Medications   Current Outpatient Rx  Name  Route  Sig  Dispense  Refill  . amitriptyline (ELAVIL) 150 MG tablet      1TY BY MOUTH AT BEDTIME   30 tablet   3   . anastrozole (ARIMIDEX) 1 MG tablet   Oral   Take 1 tablet (1 mg total) by mouth daily.   90 tablet   2     CALLED TO PHARMACY   . aspirin 81 MG tablet   Oral   Take 81 mg by mouth daily.           [name] atorvastatin (LIPITOR) 40 MG tablet   Oral   Take 1 tablet (40 mg total) by mouth daily.   90 tablet   3   . beta carotene 16109 UNIT capsule   Oral   Take 25,000 Units by mouth daily.           . calcium carbonate (TUMS - DOSED IN MG ELEMENTAL CALCIUM) 500 MG chewable tablet   Oral   Chew 750 mg by mouth 2 (two) times daily.          . Cholecalciferol (VITAMIN D3) 1000 UNITS CAPS   Oral   Take 2 tablets by mouth daily.           [name] FERRETTS 325 (106 FE) MG TABS      take 1 tablet by mouth once daily   30 tablet   4   . fluticasone (FLOVENT HFA) 220 MCG/ACT inhaler   Inhalation   Inhale 4 puffs into the lungs 2 (two) times daily. swallow   1 Inhaler   12   . LORazepam (ATIVAN) 1 MG tablet   Oral   Take 1 tablet (1 mg total) by mouth every 6 (six) hours as needed for anxiety (x 10 days & then drop to tid x 7 days then back to bid).   90 tablet  1   . metoprolol tartrate (LOPRESSOR) 25 MG  tablet   Oral   Take 0.5 tablets (12.5 mg total) by mouth 2 (two) times daily.   30 tablet   1   . pantoprazole (PROTONIX) 40 MG tablet   Oral   Take 1 tablet (40 mg total) by mouth 2 (two) times daily before a meal. 30-60 minutes before breakfast and supper   180 tablet   3   . Probiotic Product (PROBIOTIC FORMULA PO)   Oral   Take 1 capsule by mouth daily.         . vitamin B-12 (CYANOCOBALAMIN) 1000 MCG tablet   Oral   Take 1,000 mcg by mouth daily.             BP 109/68  Pulse 82  Temp(Src) 98.7 F (37.1 C) (Oral)  Resp 16  Ht 5\' 6"  (1.676 m)  Wt 130 lb (58.968 kg)  BMI 20.99 kg/m2  SpO2 100%  Physical Exam  Constitutional: She is oriented to person, place, and time. She appears well-developed and well-nourished. No distress.  HENT:  Head: Normocephalic and atraumatic.  Mouth/Throat: Oropharynx is clear and moist. No oropharyngeal exudate.  Eyes: Conjunctivae and EOM are normal. Pupils are equal, round, and reactive to light.  Neck: Normal range of motion. Neck supple.  Cardiovascular: Normal rate and normal heart sounds.   Pulmonary/Chest: Effort normal and breath sounds normal. No respiratory distress.  Abdominal: Soft. There is no tenderness. There is no rebound and no guarding.  Musculoskeletal: Normal range of motion. She exhibits tenderness. She exhibits no edema.  Abrasion to base of left thumb on the palmar surface. +2 radial pulse, cardinal hand movements intact. No pain at snuff box. Full range of motion of elbow and wrist. Ecchymosis to left knee  Neurological: She is alert and oriented to person, place, and time. No cranial nerve deficit. She exhibits normal muscle tone. Coordination normal.  Skin: Skin is warm.    ED Course  Procedures (including critical care time)  Labs Reviewed - No data to display Dg Hand Complete Left  04/08/2013   *RADIOLOGY REPORT*  Clinical Data: Left hand pain status post fall, particularly at the first metacarpal,  with soft tissue abrasion.  LEFT HAND - COMPLETE 3+ VIEW  Comparison: Left forearm radiographs performed 11/01/2011  Findings: There is no evidence of fracture or dislocation.  The joint spaces are preserved; the known soft tissue abrasion is not well characterized on radiograph.  The carpal rows are intact, and demonstrate normal alignment.  The first metacarpal appears grossly intact.  IMPRESSION: No evidence of fracture or dislocation.   Original Report Authenticated By: Tonia Ghent, M.D.     No diagnosis found.    MDM  Mechanical fall with left thumb and right knee pain. Did not hit head or lose consciousness. No anticoagulants. No head, neck, back, chest or abdominal pain.  Abrasion to base of left thumb. X-ray negative for fracture. No snuffbox tenderness. Tetanus updated. Xray of knee negative.  ambulatory without assistance. Spica splint given for comfort. Followup with ortho prn.   Glynn Octave, MD 04/09/13 [phone]

## 2013-04-15 ENCOUNTER — Other Ambulatory Visit: Payer: Self-pay | Admitting: Family Medicine

## 2013-04-16 ENCOUNTER — Ambulatory Visit: Payer: Medicare Other | Admitting: Internal Medicine

## 2013-04-16 NOTE — Telephone Encounter (Signed)
Please advise refill?  Last RX was wrote on 10-21-12 quantity 90 with 1 refill  If ok fax to [phone]

## 2013-04-21 ENCOUNTER — Ambulatory Visit (INDEPENDENT_AMBULATORY_CARE_PROVIDER_SITE_OTHER): Payer: Medicare Other | Admitting: Family Medicine

## 2013-04-21 ENCOUNTER — Encounter: Payer: Self-pay | Admitting: Family Medicine

## 2013-04-21 VITALS — BP 116/80 | HR 97 | Temp 98.3°F | Ht 66.0 in | Wt 131.0 lb

## 2013-04-21 DIAGNOSIS — M25562 Pain in left knee: Secondary | ICD-10-CM

## 2013-04-21 DIAGNOSIS — M25569 Pain in unspecified knee: Secondary | ICD-10-CM

## 2013-04-21 NOTE — Patient Instructions (Addendum)
Switch to krill oil caps Try Glucosamine Chondroitin Try Salon Pas patch or cream   Degenerative Arthritis You have osteoarthritis. This is the wear and tear arthritis that comes with aging. It is also called degenerative arthritis. This is common in people past middle age. It is caused by stress on the joints. The large weight bearing joints of the lower extremities are most often affected. The knees, hips, back, neck, and hands can become painful, swollen, and stiff. This is the most common type of arthritis. It comes on with age, carrying too much weight, or from an injury. Treatment includes resting the sore joint until the pain and swelling improve. Crutches or a walker may be needed for severe flares. Only take over-the-counter or prescription medicines for pain, discomfort, or fever as directed by your caregiver. Local heat therapy may improve motion. Cortisone shots into the joint are sometimes used to reduce pain and swelling during flares. Osteoarthritis is usually not crippling and progresses slowly. There are things you can do to decrease pain:  Avoid high impact activities.  Exercise regularly.  Low impact exercises such as walking, biking and swimming help to keep the muscles strong and keep normal joint function.  Stretching helps to keep your range of motion.  Lose weight if you are overweight. This reduces joint stress. In severe cases when you have pain at rest or increasing disability, joint surgery may be helpful. See your caregiver for follow-up treatment as recommended.  SEEK IMMEDIATE MEDICAL CARE IF:   You have severe joint pain.  Marked swelling and redness in your joint develops.  You develop a high fever. Document Released: 10/29/2005 Document Revised: 01/21/2012 Document Reviewed: 03/31/2007 [facility] Patient Information 2014 [place], [place]. Arthritis, Rheumatoid You have rheumatoid arthritis. This is arthritis caused by problems with the immune system.  Symptoms may include swelling, pain, tenderness and heat around the affected joints. The hands, feet, and knees are most often involved. The pain may move from joint to joint. Other symptoms such as fever, weight loss, tiredness, and anemia may also occur. There is no cure. Most people get along well with treatment. A few patients have severe joint damage and disability. Only take over-the-counter or prescription medicines for pain or discomfort as directed by your caregiver. More powerful drugs such as cortisone and narcotics may be prescribed when symptoms are severe. Heat and other physical therapies are usually helpful. There are new medications that treat the underlying immune system problem. These are prescribed by specialists. You should be as active as possible, but extra rest is needed if the arthritis flares up. Hot painful joints sometimes need temporary splinting. To assure proper long-term treatment, see your doctor or a joint specialist or rheumatologist. Document Released: 12/06/2004 Document Revised: 10/18/2011 Document Reviewed: 10/29/2005 [employer] Patient Information 2012 [employer], Rhunette Croft, Cartilage and its Function The menisci are made of tough cartilage, and fit between the surfaces of the bones upon which they rest. The menisci are semi lunar (C-shaped) and have a wedged profile. The wedged profile helps the stability of the joint by keeping the rounded femur surface from sliding off the flat tibial surface. The menisci are nourished (fed) by small blood vessels; but there is also a large area in the center of the meniscus that does not have a good blood supply (avascular). This presents a problem when there is an injury to the meniscus, as areas without good blood supply heal poorly. When there is a torn cartilage in the knee, surgery is often required  to fix this. This is usually done with an arthroscopy (a surgical procedure less invasive than open surgery). Some times open surgery of  the knee is required if there is other damage which has occurred. The medial meniscus rests on the medial tibial plateau. The tibia is the large bone in your lower leg (the shin bone). The medial tibial plateau is the upper end of the bone making up the inner part of your knee. The lateral meniscus serves the same purpose and is located on the outside of the knee. The menisci help to distribute your body weight across the knee joint. Without the meniscus present, the weight of your body would be unevenly applied to the bones in your legs (the femur and tibia). The femur is the large bone in your thigh. This uneven weight distribution would cause increased wear and tear on the cartilage covering the bones, leading to early damage of these areas. The presence of the menisci cartilage is necessary for a healthy knee. PURPOSE OF THE KNEE CARTILAGE The knee joint is made up of three bones: the thigh bone (femur), the shin bone (tibia), and the knee cap (patella). The surfaces of these bones at the knee joint are covered with cartilage. This smooth, slippery surface allows the bones to slide against each other without causing bone damage. The meniscus sits between these cartilaginous surfaces of the bones (femur and tibia). It distributes the weight evenly in the joints and helps with the stability of the joint (keeps the joint steady). HOME CARE INSTRUCTIONS After surgery:  Use crutches as instructed.  Once home, an ice pack applied to your injury may help with discomfort and keep the swelling down. An ice pack can be used for 15-20 minutes 3-4 times per day for the first 2-3 days or as instructed.  Only take over-the-counter or prescription medicines for pain, discomfort, or fever as directed by your caregiver.  Call if you do not have relief of pain with medications or if there is an increase in pain.  Call if your foot becomes cold or blue.  Call if your knee gets stuck in a fixed position and will  not move.  You may resume normal diet and activities as directed.  Make sure to keep your appointment with your follow-up caregiver. This injury may require further evaluation and treatment beyond the treatment you received today. MAKE SURE YOU:   Understand these instructions.  Will watch your condition.  Will get help right away if you are not doing well or get worse. Document Released: 04/20/2002 Document Revised: 01/21/2012 Document Reviewed: 08/31/2009 [facility] Patient Information 2014 [place], [place].

## 2013-04-22 ENCOUNTER — Encounter: Payer: Self-pay | Admitting: *Deleted

## 2013-04-22 DIAGNOSIS — R700 Elevated erythrocyte sedimentation rate: Secondary | ICD-10-CM

## 2013-04-22 LAB — SEDIMENTATION RATE: Sed Rate: 51 mm/hr — ABNORMAL HIGH (ref 0–22)

## 2013-04-23 NOTE — Progress Notes (Signed)
Patient ID: Anne [name], female   DOB: 04/12/1941, 72 y.o.   MRN: [id number] ZUHA DEJONGE [id number] 1940/11/17 04/23/2013      Progress Note-Follow Up  Subjective  Chief Complaint  Chief Complaint  Patient presents with  . knee pain and weak    HPI  Caucasian female in today complaining of knee pain. She's been struggling with stiffness and pain in her knees. She describes feeling somewhat weak. She has not been walking routinely for roughly the last 6 months secondary to a house perhaps work done. She's to start back and notes it walking has become difficult now with this weakness and pain. Only swelling noted that she's previously been noted to have a Baker's cyst. She denies warmth or erythema. No trauma or falls. No calf pain or swelling. No other complaints. No GI or GU complaints. No shortness of breath, chest pain, headache or acute concerns  Past Medical History  Diagnosis Date  . Anemia   . COPD (chronic obstructive pulmonary disease)   . Hyperlipidemia   . Pneumonia 2-10    pleurisy  . Arthritis   . Depression   . GERD (gastroesophageal reflux disease) 09/29/2009    improved s/p cholecystectomy and esophagus dilatation  . History of chicken pox   . History of shingles     2 episodes  . History of measles   . Osteopenia 03/14/2011  . 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 Pure hypercholesterolemia 10/17/2010  . 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 ESOPHAGEAL STRICTURE 03/29/2009  . CAROTID ARTERY STENOSIS 01/13/2010  . ALKALINE PHOSPHATASE, ELEVATED 03/15/2009  . Anxiety and depression 04/28/2011  . Vaginitis 05/22/2011  . Baker's cyst of knee 05/22/2011  . Trauma 10/18/2011  . Radial neck fracture 10/2011    minimally displaced  . Atypical chest pain 11/30/2011  . Folliculitis of nose 12/31/2011  . Esophageal ring   . 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 Urinary incontinence 03/19/2012  . Allergic state 06/10/2012  .  Hematuria   . Dysphagia   . URI (upper respiratory infection) 09/02/2012  . Preventative health care 10/21/2012  . Dermatitis 11/23/2012  . Fall 11/23/2012  . Hx of echocardiogram     a. Echo 01/2103: mild LVH, EF 55-60%, normal wall motion, Gr 1 diast dysfn  . Knee pain, bilateral 07/23/2011    Past Surgical History  Procedure Laterality Date  . Anterior cruciate ligament repair  08, 09, 10  . Ercp, cbd stone extraction  2010  . Cholecystectomy  2010  . Egd/ dili  2010, 2012  . Breast surgery  2001    lumpectomy (01)  . Colonoscopy  09/05/10    cecal avm's  . Esophagogastroduodenoscopy  01/08/2012    Procedure: ESOPHAGOGASTRODUODENOSCOPY (EGD);  Surgeon: [name], MD;  Location: [facility];  Service: Endoscopy;  Laterality: N/A;  . Savory dilation  01/08/2012    Procedure: SAVORY DILATION;  Surgeon: [name], MD;  Location: [facility];  Service: Endoscopy;  Laterality: N/A;  need xray  . Mastectomy modified radical      , Mastectomy modified radical (08), breast reconstruction, CA lesions excised lateral abd wall 2010  . Breast reconstructive surgery  2008, 2009, 2010    Family History  Problem Relation Age of Onset  . Heart disease Father   . Lung cancer Father     smoker  . Cirrhosis Sister  Primary Biliary  . Breast cancer    . Stroke Maternal Grandmother   . Alcohol abuse Maternal Grandfather   . Heart disease Paternal Grandfather   . Anxiety disorder Sister   . Osteoporosis Sister   . Arthritis Sister     Rheumatoid  . Skin cancer Sister   . Osteoporosis Sister   . Cancer Sister     multiple skin cancers, over 90 excisions.  . Other Mother     tic deleauroux  . Anesthesia problems Neg Hx   . Hypotension Neg Hx   . Malignant hyperthermia Neg Hx   . Pseudochol deficiency Neg Hx   . Colon cancer Neg Hx   . Ovarian cancer    . Pancreatic cancer      History   Social History  . Marital Status: Married    Spouse Name: N/A    Number of  Children: N/A  . Years of Education: N/A   Occupational History  . Not on file.   Social History Main Topics  . Smoking status: Former Smoker    Types: Cigarettes    Quit date: 05/22/2007  . Smokeless tobacco: Never Used  . Alcohol Use: 0.6 oz/week    1 Glasses of wine per week     Comment: 6 oz daily  . Drug Use: No  . Sexually Active: Yes -- Female partner(s)   Other Topics Concern  . Not on file   Social History Narrative  . No narrative on file    Current Outpatient Prescriptions on File Prior to Visit  Medication Sig Dispense Refill  . amitriptyline (ELAVIL) 150 MG tablet 1TY BY MOUTH AT BEDTIME  30 tablet  3  . anastrozole (ARIMIDEX) 1 MG tablet Take 1 tablet (1 mg total) by mouth daily.  90 tablet  2  . aspirin 81 MG tablet Take 81 mg by mouth daily.        [name] atorvastatin (LIPITOR) 40 MG tablet Take 1 tablet (40 mg total) by mouth daily.  90 tablet  3  . beta carotene 16109 UNIT capsule Take 25,000 Units by mouth daily.        . calcium carbonate (TUMS - DOSED IN MG ELEMENTAL CALCIUM) 500 MG chewable tablet Chew 750 mg by mouth 2 (two) times daily.       . Cholecalciferol (VITAMIN D3) 1000 UNITS CAPS Take 2 tablets by mouth daily.        [name] FERRETTS 325 (106 FE) MG TABS take 1 tablet by mouth once daily  30 tablet  4  . fluticasone (FLOVENT HFA) 220 MCG/ACT inhaler Inhale 4 puffs into the lungs 2 (two) times daily. swallow  1 Inhaler  12  . ibuprofen (ADVIL,MOTRIN) 800 MG tablet Take 1 tablet (800 mg total) by mouth 3 (three) times daily.  21 tablet  0  . LORazepam (ATIVAN) 1 MG tablet TAKE 1 TABLET BY MOUTH TWICE A DAY  90 tablet  1  . metoprolol tartrate (LOPRESSOR) 25 MG tablet Take 0.5 tablets (12.5 mg total) by mouth 2 (two) times daily.  30 tablet  1  . pantoprazole (PROTONIX) 40 MG tablet Take 1 tablet (40 mg total) by mouth 2 (two) times daily before a meal. 30-60 minutes before breakfast and supper  180 tablet  3  . Probiotic Product (PROBIOTIC FORMULA PO) Take 1  capsule by mouth daily.      . vitamin B-12 (CYANOCOBALAMIN) 1000 MCG tablet Take 1,000 mcg by mouth daily.  No current facility-administered medications on file prior to visit.    Allergies  Allergen Reactions  . Codeine Other (See Comments)    "flu like symptoms"  . Diphenhydramine Hcl Other (See Comments)    "nervous and upset"  . Morphine Other (See Comments)    "flu like symptoms"  . Peanut-Containing Drug Products Hives  . Zofran Other (See Comments)    HEADACHE    Review of Systems  Review of Systems  Constitutional: Negative for fever and malaise/fatigue.  HENT: Negative for congestion.   Eyes: Negative for discharge.  Respiratory: Negative for shortness of breath.   Cardiovascular: Negative for chest pain, palpitations and leg swelling.  Gastrointestinal: Negative for nausea, abdominal pain and diarrhea.  Genitourinary: Negative for dysuria.  Musculoskeletal: Positive for joint pain. Negative for falls.  Skin: Negative for rash.  Neurological: Negative for loss of consciousness and headaches.  Endo/Heme/Allergies: Negative for polydipsia.  Psychiatric/Behavioral: Negative for depression and suicidal ideas. The patient is not nervous/anxious and does not have insomnia.     Objective  BP 116/80  Pulse 97  Temp(Src) 98.3 F (36.8 C) (Oral)  Ht 5\' 6"  (1.676 m)  Wt 131 lb (59.421 kg)  BMI 21.15 kg/m2  SpO2 97%  Physical Exam  Physical Exam  Constitutional: She is oriented to person, place, and time and well-developed, well-nourished, and in no distress. No distress.  HENT:  Head: Normocephalic and atraumatic.  Eyes: Conjunctivae are normal.  Neck: Neck supple. No thyromegaly present.  Cardiovascular: Normal rate, regular rhythm and normal heart sounds.   No murmur heard. Pulmonary/Chest: Effort normal and breath sounds normal. She has no wheezes.  Abdominal: She exhibits no distension and no mass.  Musculoskeletal: She exhibits tenderness. She  exhibits no edema.  With palpation over right medial meniscus. No other joint line tenderness or ligamentous laxity mild swelling posterior knees  Lymphadenopathy:    She has no cervical adenopathy.  Neurological: She is alert and oriented to person, place, and time.  Skin: Skin is warm and dry. No rash noted. She is not diaphoretic.  Psychiatric: Memory, affect and judgment normal.    Lab Results  Component Value Date   TSH 0.61 01/13/2013   Lab Results  Component Value Date   WBC 4.9 01/13/2013   HGB 12.5 01/13/2013   HCT 38.0 01/13/2013   MCV 93.2 01/13/2013   PLT 277.0 01/13/2013   Lab Results  Component Value Date   CREATININE 0.8 01/13/2013   BUN 16 01/13/2013   NA 139 01/13/2013   K 4.0 01/13/2013   CL 101 01/13/2013   CO2 30 01/13/2013   Lab Results  Component Value Date   ALT 16 01/13/2013   AST 18 01/13/2013   ALKPHOS 105 01/13/2013   BILITOT 0.6 01/13/2013   Lab Results  Component Value Date   CHOL 201* 01/13/2013   Lab Results  Component Value Date   HDL 65.70 01/13/2013   Lab Results  Component Value Date   LDLCALC 89 10/21/2012   Lab Results  Component Value Date   TRIG 158.0* 01/13/2013   Lab Results  Component Value Date   CHOLHDL 3 01/13/2013     Assessment & Plan  Knee pain, bilateral May alternate Ibuprofen in small doses with Tylenol prn, may try Salon Pas. Start krill oil caps and Cosamin DS. Report worsening symptoms. Worst pain is over right medial meniscus

## 2013-04-23 NOTE — Assessment & Plan Note (Signed)
May alternate Ibuprofen in small doses with Tylenol prn, may try Salon Pas. Start krill oil caps and Cosamin DS. Report worsening symptoms. Worst pain is over right medial meniscus

## 2013-04-24 ENCOUNTER — Telehealth: Payer: Self-pay | Admitting: Oncology

## 2013-04-24 NOTE — Telephone Encounter (Signed)
Moved 7/16 appt to covering provider due to LL's departure. S/w pt re new time for 7/16 @ 10am. Pt does not wish to see a female provider and was informed that I would forward her concerns to Trey Paula H who would contact her re her concerns. Message to [place].

## 2013-04-27 ENCOUNTER — Telehealth: Payer: Self-pay | Admitting: *Deleted

## 2013-04-27 NOTE — Telephone Encounter (Signed)
Called pt and let her know message below

## 2013-04-27 NOTE — Telephone Encounter (Signed)
Message copied by Phillis Knack on Mon Apr 27, 2013  1:31 PM ------      Message from: Jama Flavors P      Created: Mon Apr 27, 2013 12:57 PM       Patient had called last week re labs from PCP Dr Abner Greenspan. Please let her know that I looked at these (RF and ESR) as well as note from Dr Abner Greenspan. I am in agreement with her recommendations and her plan for follow up.       thanks ------

## 2013-05-01 ENCOUNTER — Telehealth: Payer: Self-pay | Admitting: Internal Medicine

## 2013-05-01 NOTE — Telephone Encounter (Signed)
New problem    C/o SOB .

## 2013-05-01 NOTE — Telephone Encounter (Signed)
Pt called to report occasional shortness of breath while walking.  Denies chest pain.  States "I think it is my heart medicine causing this but it really helps my heart."  Not clear of what patient wants.  Advised pt that Dr. Tenny Craw is out until next week.  Pt said that is fine it is not an emergency.  Will forward to Dr. Tenny Craw for review.

## 2013-05-04 ENCOUNTER — Ambulatory Visit (INDEPENDENT_AMBULATORY_CARE_PROVIDER_SITE_OTHER): Payer: Medicare Other | Admitting: Family

## 2013-05-04 ENCOUNTER — Encounter: Payer: Self-pay | Admitting: Family

## 2013-05-04 VITALS — BP 90/70 | HR 78 | Temp 97.8°F | Resp 16 | Wt 132.0 lb

## 2013-05-04 DIAGNOSIS — W57XXXA Bitten or stung by nonvenomous insect and other nonvenomous arthropods, initial encounter: Secondary | ICD-10-CM | POA: Insufficient documentation

## 2013-05-04 MED ORDER — DOXYCYCLINE HYCLATE 100 MG PO TABS: 100.0000 mg | ORAL_TABLET | Freq: Two times a day (BID) | ORAL | Status: DC

## 2013-05-04 NOTE — Telephone Encounter (Signed)
She can increase metoprolol to 1 tablet in AM   Keep 1/2 at night. Call in a couple wks with response

## 2013-05-04 NOTE — Patient Instructions (Addendum)
Call if redness worsens or if it does not continue to improve. Call if fever, joint pain or rash.

## 2013-05-04 NOTE — Assessment & Plan Note (Signed)
Will plan to treat for local reaction/cellulitis with doxycycline.  Clinically not consistent with lyme disease at this time, but I did advise her of symptoms as outlined in AVS and asked her to contact us if she develops these symptoms.

## 2013-05-04 NOTE — Progress Notes (Signed)
Subjective:    Patient ID: Anne [name], female    DOB: May 07, 1941, 72 y.o.   MRN: [id number]  HPI  Anne [name] is a 72 yr old female who presents today with chief complaint of tick bite.  She reports that her husband found tick on her left upper chest above the clavicle on Saturday afternoon. He successfully removed the tick. Since that time, the area surrounding the tick bite has become inflammed and tender. She denies fever, joint pain or rash.   Review of Systems    see HPI  Past Medical History  Diagnosis Date  . Anemia   . COPD (chronic obstructive pulmonary disease)   . Hyperlipidemia   . Pneumonia 2-10    pleurisy  . Arthritis   . Depression   . GERD (gastroesophageal reflux disease) 09/29/2009    improved s/p cholecystectomy and esophagus dilatation  . History of chicken pox   . History of shingles     2 episodes  . History of measles   . Osteopenia 03/14/2011  . 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 Pure hypercholesterolemia 10/17/2010  . 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 ESOPHAGEAL STRICTURE 03/29/2009  . CAROTID ARTERY STENOSIS 01/13/2010  . ALKALINE PHOSPHATASE, ELEVATED 03/15/2009  . Anxiety and depression 04/28/2011  . Vaginitis 05/22/2011  . Baker's cyst of knee 05/22/2011  . Trauma 10/18/2011  . Radial neck fracture 10/2011    minimally displaced  . Atypical chest pain 11/30/2011  . Folliculitis of nose 12/31/2011  . Esophageal ring   . 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 Urinary incontinence 03/19/2012  . Allergic state 06/10/2012  . Hematuria   . Dysphagia   . URI (upper respiratory infection) 09/02/2012  . Preventative health care 10/21/2012  . Dermatitis 11/23/2012  . Fall 11/23/2012  . Hx of echocardiogram     a. Echo 01/2103: mild LVH, EF 55-60%, normal wall motion, Gr 1 diast dysfn  . Knee pain, bilateral 07/23/2011    History   Social History  . Marital Status: Married    Spouse Name: N/A    Number  of Children: N/A  . Years of Education: N/A   Occupational History  . Not on file.   Social History Main Topics  . Smoking status: Former Smoker    Types: Cigarettes    Quit date: 05/22/2007  . Smokeless tobacco: Never Used  . Alcohol Use: 0.6 oz/week    1 Glasses of wine per week     Comment: 6 oz daily  . Drug Use: No  . Sexually Active: Yes -- Female partner(s)   Other Topics Concern  . Not on file   Social History Narrative  . No narrative on file    Past Surgical History  Procedure Laterality Date  . Anterior cruciate ligament repair  08, 09, 10  . Ercp, cbd stone extraction  2010  . Cholecystectomy  2010  . Egd/ dili  2010, 2012  . Breast surgery  2001    lumpectomy (01)  . Colonoscopy  09/05/10    cecal avm's  . Esophagogastroduodenoscopy  01/08/2012    Procedure: ESOPHAGOGASTRODUODENOSCOPY (EGD);  Surgeon: [name], MD;  Location: [facility];  Service: Endoscopy;  Laterality: N/A;  . Savory dilation  01/08/2012    Procedure: SAVORY DILATION;  Surgeon: [name], MD;  Location: [facility];  Service: Endoscopy;  Laterality: N/A;  need xray  .  Mastectomy modified radical      , Mastectomy modified radical (08), breast reconstruction, CA lesions excised lateral abd wall 2010  . Breast reconstructive surgery  2008, 2009, 2010    Family History  Problem Relation Age of Onset  . Heart disease Father   . Lung cancer Father     smoker  . Cirrhosis Sister     Primary Biliary  . Breast cancer    . Stroke Maternal Grandmother   . Alcohol abuse Maternal Grandfather   . Heart disease Paternal Grandfather   . Anxiety disorder Sister   . Osteoporosis Sister   . Arthritis Sister     Rheumatoid  . Skin cancer Sister   . Osteoporosis Sister   . Cancer Sister     multiple skin cancers, over 90 excisions.  . Other Mother     tic deleauroux  . Anesthesia problems Neg Hx   . Hypotension Neg Hx   . Malignant hyperthermia Neg Hx   . Pseudochol deficiency  Neg Hx   . Colon cancer Neg Hx   . Ovarian cancer    . Pancreatic cancer      Allergies  Allergen Reactions  . Codeine Other (See Comments)    "flu like symptoms"  . Diphenhydramine Hcl Other (See Comments)    "nervous and upset"  . Morphine Other (See Comments)    "flu like symptoms"  . Peanut-Containing Drug Products Hives  . Zofran Other (See Comments)    HEADACHE    Current Outpatient Prescriptions on File Prior to Visit  Medication Sig Dispense Refill  . amitriptyline (ELAVIL) 150 MG tablet 1TY BY MOUTH AT BEDTIME  30 tablet  3  . anastrozole (ARIMIDEX) 1 MG tablet Take 1 tablet (1 mg total) by mouth daily.  90 tablet  2  . aspirin 81 MG tablet Take 81 mg by mouth daily.        [name] atorvastatin (LIPITOR) 40 MG tablet Take 1 tablet (40 mg total) by mouth daily.  90 tablet  3  . beta carotene 16109 UNIT capsule Take 25,000 Units by mouth daily.        . calcium carbonate (TUMS - DOSED IN MG ELEMENTAL CALCIUM) 500 MG chewable tablet Chew 750 mg by mouth 2 (two) times daily.       . Cholecalciferol (VITAMIN D3) 1000 UNITS CAPS Take 2 tablets by mouth daily.        [name] FERRETTS 325 (106 FE) MG TABS take 1 tablet by mouth once daily  30 tablet  4  . fluticasone (FLOVENT HFA) 220 MCG/ACT inhaler Inhale 4 puffs into the lungs 2 (two) times daily. swallow  1 Inhaler  12  . ibuprofen (ADVIL,MOTRIN) 800 MG tablet Take 1 tablet (800 mg total) by mouth 3 (three) times daily.  21 tablet  0  . LORazepam (ATIVAN) 1 MG tablet TAKE 1 TABLET BY MOUTH TWICE A DAY  90 tablet  1  . metoprolol tartrate (LOPRESSOR) 25 MG tablet Take 0.5 tablets (12.5 mg total) by mouth 2 (two) times daily.  30 tablet  1  . pantoprazole (PROTONIX) 40 MG tablet Take 1 tablet (40 mg total) by mouth 2 (two) times daily before a meal. 30-60 minutes before breakfast and supper  180 tablet  3  . Probiotic Product (PROBIOTIC FORMULA PO) Take 1 capsule by mouth daily.      . vitamin B-12 (CYANOCOBALAMIN) 1000 MCG tablet Take  1,000 mcg by mouth daily.  No current facility-administered medications on file prior to visit.    BP 90/70  Pulse 78  Temp(Src) 97.8 F (36.6 C) (Oral)  Resp 16  Wt 132 lb (59.875 kg)  BMI 21.32 kg/m2  SpO2 99%    Objective:   Physical Exam  Constitutional: She is oriented to person, place, and time. She appears well-developed and well-nourished. No distress.  Pulmonary/Chest: Effort normal and breath sounds normal.  Musculoskeletal: She exhibits no edema.  Neurological: She is alert and oriented to person, place, and time.  Skin:  Silver dollar sized area of tender erythema/swelling noted overlying the left clavicle.           Assessment & Plan:

## 2013-05-07 ENCOUNTER — Other Ambulatory Visit: Payer: Self-pay | Admitting: *Deleted

## 2013-05-07 MED ORDER — METOPROLOL TARTRATE 25 MG PO TABS: ORAL_TABLET | ORAL | Status: DC

## 2013-05-07 NOTE — Telephone Encounter (Signed)
Pt.notified

## 2013-05-07 NOTE — Telephone Encounter (Signed)
F/u   Pt has called regarding sob-states she hasn't heard from anyone

## 2013-05-08 ENCOUNTER — Telehealth: Payer: Self-pay | Admitting: Oncology

## 2013-05-12 ENCOUNTER — Telehealth: Payer: Self-pay | Admitting: Family Medicine

## 2013-05-12 ENCOUNTER — Other Ambulatory Visit: Payer: Self-pay | Admitting: Family Medicine

## 2013-05-12 DIAGNOSIS — M2550 Pain in unspecified joint: Secondary | ICD-10-CM

## 2013-05-12 DIAGNOSIS — M549 Dorsalgia, unspecified: Secondary | ICD-10-CM

## 2013-05-12 NOTE — Telephone Encounter (Signed)
Patient states that she would like to be referred to a rheumatologist regarding her arthritis

## 2013-05-20 ENCOUNTER — Emergency Department (HOSPITAL_BASED_OUTPATIENT_CLINIC_OR_DEPARTMENT_OTHER): Payer: Medicare Other

## 2013-05-20 ENCOUNTER — Telehealth: Payer: Self-pay | Admitting: *Deleted

## 2013-05-20 ENCOUNTER — Encounter (HOSPITAL_BASED_OUTPATIENT_CLINIC_OR_DEPARTMENT_OTHER): Payer: Self-pay | Admitting: Family Medicine

## 2013-05-20 ENCOUNTER — Emergency Department (HOSPITAL_BASED_OUTPATIENT_CLINIC_OR_DEPARTMENT_OTHER)
Admission: EM | Admit: 2013-05-20 | Discharge: 2013-05-20 | Disposition: A | Discharge status: Home / Self Care | Payer: Medicare Other | Attending: Emergency Medicine | Admitting: Emergency Medicine

## 2013-05-20 DIAGNOSIS — Z7982 Long term (current) use of aspirin: Secondary | ICD-10-CM | POA: Insufficient documentation

## 2013-05-20 DIAGNOSIS — Y939 Activity, unspecified: Secondary | ICD-10-CM | POA: Insufficient documentation

## 2013-05-20 DIAGNOSIS — R51 Headache: Secondary | ICD-10-CM | POA: Insufficient documentation

## 2013-05-20 DIAGNOSIS — Z8679 Personal history of other diseases of the circulatory system: Secondary | ICD-10-CM | POA: Insufficient documentation

## 2013-05-20 DIAGNOSIS — Y929 Unspecified place or not applicable: Secondary | ICD-10-CM | POA: Insufficient documentation

## 2013-05-20 DIAGNOSIS — Z87891 Personal history of nicotine dependence: Secondary | ICD-10-CM | POA: Insufficient documentation

## 2013-05-20 DIAGNOSIS — R55 Syncope and collapse: Secondary | ICD-10-CM | POA: Insufficient documentation

## 2013-05-20 DIAGNOSIS — Z9189 Other specified personal risk factors, not elsewhere classified: Secondary | ICD-10-CM | POA: Insufficient documentation

## 2013-05-20 DIAGNOSIS — Z8619 Personal history of other infectious and parasitic diseases: Secondary | ICD-10-CM | POA: Insufficient documentation

## 2013-05-20 DIAGNOSIS — Z853 Personal history of malignant neoplasm of breast: Secondary | ICD-10-CM | POA: Insufficient documentation

## 2013-05-20 DIAGNOSIS — Z79899 Other long term (current) drug therapy: Secondary | ICD-10-CM | POA: Insufficient documentation

## 2013-05-20 DIAGNOSIS — Z8742 Personal history of other diseases of the female genital tract: Secondary | ICD-10-CM | POA: Insufficient documentation

## 2013-05-20 DIAGNOSIS — Z862 Personal history of diseases of the blood and blood-forming organs and certain disorders involving the immune mechanism: Secondary | ICD-10-CM | POA: Insufficient documentation

## 2013-05-20 DIAGNOSIS — Z8701 Personal history of pneumonia (recurrent): Secondary | ICD-10-CM | POA: Insufficient documentation

## 2013-05-20 DIAGNOSIS — J4489 Other specified chronic obstructive pulmonary disease: Secondary | ICD-10-CM | POA: Insufficient documentation

## 2013-05-20 DIAGNOSIS — E785 Hyperlipidemia, unspecified: Secondary | ICD-10-CM | POA: Insufficient documentation

## 2013-05-20 DIAGNOSIS — J449 Chronic obstructive pulmonary disease, unspecified: Secondary | ICD-10-CM | POA: Insufficient documentation

## 2013-05-20 DIAGNOSIS — Z8639 Personal history of other endocrine, nutritional and metabolic disease: Secondary | ICD-10-CM | POA: Insufficient documentation

## 2013-05-20 DIAGNOSIS — Z8739 Personal history of other diseases of the musculoskeletal system and connective tissue: Secondary | ICD-10-CM | POA: Insufficient documentation

## 2013-05-20 DIAGNOSIS — F0781 Postconcussional syndrome: Secondary | ICD-10-CM | POA: Insufficient documentation

## 2013-05-20 DIAGNOSIS — Z8719 Personal history of other diseases of the digestive system: Secondary | ICD-10-CM | POA: Insufficient documentation

## 2013-05-20 DIAGNOSIS — K219 Gastro-esophageal reflux disease without esophagitis: Secondary | ICD-10-CM | POA: Insufficient documentation

## 2013-05-20 DIAGNOSIS — Z872 Personal history of diseases of the skin and subcutaneous tissue: Secondary | ICD-10-CM | POA: Insufficient documentation

## 2013-05-20 DIAGNOSIS — Z87448 Personal history of other diseases of urinary system: Secondary | ICD-10-CM | POA: Insufficient documentation

## 2013-05-20 DIAGNOSIS — R5381 Other malaise: Secondary | ICD-10-CM | POA: Insufficient documentation

## 2013-05-20 DIAGNOSIS — Z8709 Personal history of other diseases of the respiratory system: Secondary | ICD-10-CM | POA: Insufficient documentation

## 2013-05-20 DIAGNOSIS — M129 Arthropathy, unspecified: Secondary | ICD-10-CM | POA: Insufficient documentation

## 2013-05-20 DIAGNOSIS — W010XXA Fall on same level from slipping, tripping and stumbling without subsequent striking against object, initial encounter: Secondary | ICD-10-CM | POA: Insufficient documentation

## 2013-05-20 DIAGNOSIS — F341 Dysthymic disorder: Secondary | ICD-10-CM | POA: Insufficient documentation

## 2013-05-20 DIAGNOSIS — M899 Disorder of bone, unspecified: Secondary | ICD-10-CM | POA: Insufficient documentation

## 2013-05-20 LAB — BASIC METABOLIC PANEL
CO2: 28 mEq/L (ref 19–32)
Chloride: 100 mEq/L (ref 96–112)
GFR calc Af Amer: >90 mL/min (ref >90)
Potassium: 3.7 mEq/L (ref 3.5–5.1)
Sodium: 140 mEq/L (ref 135–145)

## 2013-05-20 LAB — CBC WITH DIFFERENTIAL/PLATELET
Basophils Absolute: 0 10*3/uL (ref 0.0–0.1)
Basophils Relative: 0 % (ref 0–1)
Eosinophils Relative: 2 % (ref 0–5)
Lymphocytes Relative: 28 % (ref 12–46)
MCHC: 32.4 g/dL (ref 30.0–36.0)
Monocytes Absolute: 0.5 10*3/uL (ref 0.1–1.0)
Neutro Abs: 4.6 10*3/uL (ref 1.7–7.7)
Platelets: 202 10*3/uL (ref 150–400)
RDW: 14 % (ref 11.5–15.5)
WBC: 7.4 10*3/uL (ref 4.0–10.5)

## 2013-05-20 LAB — URINALYSIS, ROUTINE W REFLEX MICROSCOPIC
Glucose, UA: NEGATIVE mg/dL
Nitrite: NEGATIVE
Specific Gravity, Urine: 1.009 (ref 1.005–1.030)
pH: 5.5 (ref 5.0–8.0)

## 2013-05-20 LAB — URINE MICROSCOPIC-ADD ON

## 2013-05-20 NOTE — ED Provider Notes (Signed)
History    CSN: [id number] Arrival date & time 05/20/13  1447  First MD Initiated Contact with Patient 05/20/13 1513     Chief Complaint  Patient presents with  . Fall  . Head Injury   (Consider location/radiation/quality/duration/timing/severity/associated sxs/prior Treatment) HPI Pt with fall from standing 2 days ago. Thinks she slipped on a rug. She is amnestic to event. Since the fall she states she has been fatigued, and has a headache behind her eyes worse when she shakes her head. No N/V. No focal weakness, numbness, or vision changes. Pt has been taking OTC NSAIDs at home with good relief of symptoms.  Past Medical History  Diagnosis Date  . Anemia   . COPD (chronic obstructive pulmonary disease)   . Hyperlipidemia   . Pneumonia 2-10    pleurisy  . Arthritis   . Depression   . GERD (gastroesophageal reflux disease) 09/29/2009    improved s/p cholecystectomy and esophagus dilatation  . History of chicken pox   . History of shingles     2 episodes  . History of measles   . Osteopenia 03/14/2011  . 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 Pure hypercholesterolemia 10/17/2010  . 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 ESOPHAGEAL STRICTURE 03/29/2009  . CAROTID ARTERY STENOSIS 01/13/2010  . ALKALINE PHOSPHATASE, ELEVATED 03/15/2009  . Anxiety and depression 04/28/2011  . Vaginitis 05/22/2011  . Baker's cyst of knee 05/22/2011  . Trauma 10/18/2011  . Radial neck fracture 10/2011    minimally displaced  . Atypical chest pain 11/30/2011  . Folliculitis of nose 12/31/2011  . Esophageal ring   . 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 Urinary incontinence 03/19/2012  . Allergic state 06/10/2012  . Hematuria   . Dysphagia   . URI (upper respiratory infection) 09/02/2012  . Preventative health care 10/21/2012  . Dermatitis 11/23/2012  . Fall 11/23/2012  . Hx of echocardiogram     a. Echo 01/2103: mild LVH, EF 55-60%, normal wall motion, Gr 1  diast dysfn  . Knee pain, bilateral 07/23/2011   Past Surgical History  Procedure Laterality Date  . Anterior cruciate ligament repair  08, 09, 10  . Ercp, cbd stone extraction  2010  . Cholecystectomy  2010  . Egd/ dili  2010, 2012  . Breast surgery  2001    lumpectomy (01)  . Colonoscopy  09/05/10    cecal avm's  . Esophagogastroduodenoscopy  01/08/2012    Procedure: ESOPHAGOGASTRODUODENOSCOPY (EGD);  Surgeon: [name], MD;  Location: [facility];  Service: Endoscopy;  Laterality: N/A;  . Savory dilation  01/08/2012    Procedure: SAVORY DILATION;  Surgeon: [name], MD;  Location: [facility];  Service: Endoscopy;  Laterality: N/A;  need xray  . Mastectomy modified radical      , Mastectomy modified radical (08), breast reconstruction, CA lesions excised lateral abd wall 2010  . Breast reconstructive surgery  2008, 2009, 2010   Family History  Problem Relation Age of Onset  . Heart disease Father   . Lung cancer Father     smoker  . Cirrhosis Sister     Primary Biliary  . Breast cancer    . Stroke Maternal Grandmother   . Alcohol abuse Maternal Grandfather   . Heart disease Paternal Grandfather   . Anxiety disorder Sister   . Osteoporosis Sister   . Arthritis Sister     Rheumatoid  .  Skin cancer Sister   . Osteoporosis Sister   . Cancer Sister     multiple skin cancers, over 90 excisions.  . Other Mother     tic deleauroux  . Anesthesia problems Neg Hx   . Hypotension Neg Hx   . Malignant hyperthermia Neg Hx   . Pseudochol deficiency Neg Hx   . Colon cancer Neg Hx   . Ovarian cancer    . Pancreatic cancer     History  Substance Use Topics  . Smoking status: Former Smoker    Types: Cigarettes    Quit date: 05/22/2007  . Smokeless tobacco: Never Used  . Alcohol Use: 0.6 oz/week    1 Glasses of wine per week     Comment: 6 oz daily   OB History   Grav Para Term Preterm Abortions TAB SAB Ect Mult Living                 Review of Systems   Constitutional: Positive for fatigue. Negative for fever and chills.  HENT: Negative for neck pain.   Eyes: Negative for visual disturbance.  Respiratory: Negative for cough and shortness of breath.   Cardiovascular: Negative for chest pain.  Gastrointestinal: Negative for nausea, vomiting and abdominal pain.  Genitourinary: Negative for dysuria.  Musculoskeletal: Negative for myalgias and back pain.  Skin: Negative for rash and wound.  Neurological: Positive for syncope and headaches. Negative for dizziness, weakness, light-headedness and numbness.  All other systems reviewed and are negative.    Allergies  Codeine; Diphenhydramine hcl; Morphine; Peanut-containing drug products; and Zofran  Home Medications   Current Outpatient Rx  Name  Route  Sig  Dispense  Refill  . amitriptyline (ELAVIL) 150 MG tablet      1TY BY MOUTH AT BEDTIME   30 tablet   3   . anastrozole (ARIMIDEX) 1 MG tablet   Oral   Take 1 tablet (1 mg total) by mouth daily.   90 tablet   2     CALLED TO PHARMACY   . aspirin 81 MG tablet   Oral   Take 81 mg by mouth daily.           [name] atorvastatin (LIPITOR) 40 MG tablet   Oral   Take 1 tablet (40 mg total) by mouth daily.   90 tablet   3   . beta carotene 16109 UNIT capsule   Oral   Take 25,000 Units by mouth daily.           . calcium carbonate (TUMS - DOSED IN MG ELEMENTAL CALCIUM) 500 MG chewable tablet   Oral   Chew 750 mg by mouth 2 (two) times daily.          . Cholecalciferol (VITAMIN D3) 1000 UNITS CAPS   Oral   Take 2 tablets by mouth daily.           [name] doxycycline (VIBRA-TABS) 100 MG tablet   Oral   Take 1 tablet (100 mg total) by mouth 2 (two) times daily.   14 tablet   0   . FERRETTS 325 (106 FE) MG TABS      take 1 tablet by mouth once daily   30 tablet   4   . fluticasone (FLOVENT HFA) 220 MCG/ACT inhaler   Inhalation   Inhale 4 puffs into the lungs 2 (two) times daily. swallow   1 Inhaler   12   .  ibuprofen (ADVIL,MOTRIN) 800 MG tablet  Oral   Take 1 tablet (800 mg total) by mouth 3 (three) times daily.   21 tablet   0   . LORazepam (ATIVAN) 1 MG tablet      TAKE 1 TABLET BY MOUTH TWICE A DAY   90 tablet   1   . metoprolol tartrate (LOPRESSOR) 25 MG tablet      Take 1 whole tablet in the morning and 1/2 in the evening.   30 tablet   1   . pantoprazole (PROTONIX) 40 MG tablet   Oral   Take 1 tablet (40 mg total) by mouth 2 (two) times daily before a meal. 30-60 minutes before breakfast and supper   180 tablet   3   . Probiotic Product (PROBIOTIC FORMULA PO)   Oral   Take 1 capsule by mouth daily.         . vitamin B-12 (CYANOCOBALAMIN) 1000 MCG tablet   Oral   Take 1,000 mcg by mouth daily.            BP 110/71  Pulse 86  Temp(Src) 98.2 F (36.8 C) (Oral)  Resp 20  SpO2 96% Physical Exam  Nursing note and vitals reviewed. Constitutional: She is oriented to person, place, and time. She appears well-developed and well-nourished. No distress.  HENT:  Head: Normocephalic and atraumatic.  Mouth/Throat: Oropharynx is clear and moist.  Eyes: EOM are normal. Pupils are equal, round, and reactive to light.  Neck: Normal range of motion. Neck supple.  No posterior cervical tenderness  Cardiovascular: Normal rate and regular rhythm.   Pulmonary/Chest: Effort normal and breath sounds normal. No respiratory distress. She has no wheezes. She has no rales.  Abdominal: Soft. Bowel sounds are normal. She exhibits no distension and no mass. There is no tenderness. There is no rebound and no guarding.  Musculoskeletal: Normal range of motion. She exhibits no edema and no tenderness.  Neurological: She is alert and oriented to person, place, and time.  5/5 motor in all ext, sensation intact, normal gait  Skin: Skin is warm and dry. No rash noted. No erythema.  Psychiatric: She has a normal mood and affect. Her behavior is normal.    ED Course  Procedures (including  critical care time) Labs Reviewed  URINALYSIS, ROUTINE W REFLEX MICROSCOPIC - Abnormal; Notable for the following:    Leukocytes, UA SMALL (*)    All other components within normal limits  BASIC METABOLIC PANEL - Abnormal; Notable for the following:    GFR calc non Af Amer 86 (*)    All other components within normal limits  CBC WITH DIFFERENTIAL - Abnormal; Notable for the following:    RBC 3.81 (*)    Hemoglobin 11.8 (*)    All other components within normal limits  URINE MICROSCOPIC-ADD ON   Ct Head Wo Contrast  05/20/2013   *RADIOLOGY REPORT*  Clinical Data: 72 year old female fall 2 days ago.  Headache.  Head injury. History breast cancer.  CT HEAD WITHOUT CONTRAST  Technique:  Contiguous axial images were obtained from the base of the skull through the vertex without contrast.  Comparison: PET CT 05/14/2012 and earlier.  Findings: Visualized paranasal sinuses and mastoids are clear.  No scalp hematoma.  No acute orbit soft tissue abnormality.  Calvarium intact with some hyperostosis.  There is a tiny retained metallic fragment in the scalp soft tissues at the left vertex, favor chronic.  Calcified atherosclerosis at the skull base.  Cerebral volume is within normal limits for  age.  No midline shift, ventriculomegaly, mass effect, evidence of mass lesion, intracranial hemorrhage or evidence of cortically based acute infarction.  Gray-white matter differentiation is within normal limits throughout the brain.  No suspicious intracranial vascular hyperdensity.  IMPRESSION: 1. Normal for age noncontrast appearance of the brain.  No acute traumatic injury identified. 2.  Tiny retained scalp metallic foreign body at the vertex, likely chronic.   Original Report Authenticated By: Erskine Speed, M.D.   1. Post concussive syndrome      Date: 05/20/2013  Rate: 84  Rhythm: normal sinus rhythm  QRS Axis: normal  Intervals: normal  ST/T Wave abnormalities: normal  Conduction Disutrbances:none   Narrative Interpretation:   Old EKG Reviewed: upright t waves in III and avf compared to old, uncertain significance   MDM  Normal workup and exam. Advised to continue home NSAID's and return precautions given.   Loren Racer, MD 05/20/13 1655

## 2013-05-20 NOTE — ED Notes (Signed)
Pt and husband verbally arguing  in pt room, pt came out and sat in chair in hall way waiting for MD to d/c

## 2013-05-20 NOTE — ED Notes (Signed)
Pt sts she fell 2 nights ago and "lost consciousness". Pt c/o headache.

## 2013-05-20 NOTE — ED Notes (Signed)
Patient transported to CT 

## 2013-05-20 NOTE — ED Notes (Signed)
MD at bedside. 

## 2013-05-20 NOTE — Telephone Encounter (Signed)
Caller reports that patient had a fall two days ago with a head injury and "just today realized" that she was rendered unconscious at the time of the injury and reports that the pt is having H/As and pain behind her eyes. Informed caller that pt should report immediately to an ED for A&E where they have the ability to do needed Imaging test to r/o unseen injury [such as internal bleed] and treat in their facility. Caller stated he understood & agreed/SLS

## 2013-05-22 ENCOUNTER — Ambulatory Visit: Payer: Medicare Other | Admitting: Internal Medicine

## 2013-05-22 ENCOUNTER — Ambulatory Visit (INDEPENDENT_AMBULATORY_CARE_PROVIDER_SITE_OTHER): Payer: Self-pay | Admitting: Internal Medicine

## 2013-05-22 VITALS — BP 102/75 | HR 74 | Wt 128.0 lb

## 2013-05-22 DIAGNOSIS — R55 Syncope and collapse: Secondary | ICD-10-CM

## 2013-05-22 NOTE — Progress Notes (Signed)
HPI Ptient is a 72 yo with a hstory of palptiations, CV disease, HL, COPD, anemia, EtOH abuse, GERD, eosinophic esophagitis, breast CA, orthostatic intolerance  She was recently seen by Wende Mott. She complained of some palpitations. Associated with dizziness.  She was set up for an echo which was normal. She also wore an event monitor This showed extensive PVCs (isolated, bigeminy) When I saw her in April I scheduled a myoview to r/o ischemia  This was normal. SHe was seen in the ER recently.  She got up to go to bathroom.  On way she says she slipped on rug  Fell Hit head.  DOes not reember.  Husband was there. ON talking to patient she continues to drink extensive caffeine.   Allergies  Allergen Reactions  . Codeine Other (See Comments)    "flu like symptoms"  . Diphenhydramine Hcl Other (See Comments)    "nervous and upset"  . Morphine Other (See Comments)    "flu like symptoms"  . Peanut-Containing Drug Products Hives  . Zofran Other (See Comments)    HEADACHE    Current Outpatient Prescriptions  Medication Sig Dispense Refill  . amitriptyline (ELAVIL) 150 MG tablet 1TY BY MOUTH AT BEDTIME  30 tablet  3  . anastrozole (ARIMIDEX) 1 MG tablet Take 1 tablet (1 mg total) by mouth daily.  90 tablet  2  . aspirin 81 MG tablet Take 81 mg by mouth daily.        [name] atorvastatin (LIPITOR) 40 MG tablet Take 1 tablet (40 mg total) by mouth daily.  90 tablet  3  . beta carotene 16109 UNIT capsule Take 25,000 Units by mouth daily.        . calcium carbonate (TUMS - DOSED IN MG ELEMENTAL CALCIUM) 500 MG chewable tablet Chew 750 mg by mouth 2 (two) times daily.       . Cholecalciferol (VITAMIN D3) 1000 UNITS CAPS Take 2 tablets by mouth daily.        [name] doxycycline (VIBRA-TABS) 100 MG tablet Take 1 tablet (100 mg total) by mouth 2 (two) times daily.  14 tablet  0  . ibuprofen (ADVIL,MOTRIN) 800 MG tablet Take 800 mg by mouth as needed.      [name] LORazepam (ATIVAN) 1 MG tablet TAKE 1 TABLET BY MOUTH  TWICE A DAY  90 tablet  1  . metoprolol tartrate (LOPRESSOR) 25 MG tablet Take 1 whole tablet in the morning and 1/2 in the evening.  30 tablet  1  . pantoprazole (PROTONIX) 40 MG tablet Take 1 tablet (40 mg total) by mouth 2 (two) times daily before a meal. 30-60 minutes before breakfast and supper  180 tablet  3  . Probiotic Product (PROBIOTIC FORMULA PO) Take 1 capsule by mouth daily.      . vitamin B-12 (CYANOCOBALAMIN) 1000 MCG tablet Take 1,000 mcg by mouth daily.         No current facility-administered medications for this visit.    Past Medical History  Diagnosis Date  . Anemia   . COPD (chronic obstructive pulmonary disease)   . Hyperlipidemia   . Pneumonia 2-10    pleurisy  . Arthritis   . Depression   . GERD (gastroesophageal reflux disease) 09/29/2009    improved s/p cholecystectomy and esophagus dilatation  . History of chicken pox   . History of shingles     2 episodes  . History of measles   . Osteopenia 03/14/2011  . 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 Pure hypercholesterolemia 10/17/2010  . 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 ESOPHAGEAL STRICTURE 03/29/2009  . CAROTID ARTERY STENOSIS 01/13/2010  . ALKALINE PHOSPHATASE, ELEVATED 03/15/2009  . Anxiety and depression 04/28/2011  . Vaginitis 05/22/2011  . Baker's cyst of knee 05/22/2011  . Trauma 10/18/2011  . Radial neck fracture 10/2011    minimally displaced  . Atypical chest pain 11/30/2011  . Folliculitis of nose 12/31/2011  . Esophageal ring   . 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 Urinary incontinence 03/19/2012  . Allergic state 06/10/2012  . Hematuria   . Dysphagia   . URI (upper respiratory infection) 09/02/2012  . Preventative health care 10/21/2012  . Dermatitis 11/23/2012  . Fall 11/23/2012  . Hx of echocardiogram     a. Echo 01/2103: mild LVH, EF 55-60%, normal wall motion, Gr 1 diast dysfn  . Knee pain, bilateral 07/23/2011    Past Surgical History   Procedure Laterality Date  . Anterior cruciate ligament repair  08, 09, 10  . Ercp, cbd stone extraction  2010  . Cholecystectomy  2010  . Egd/ dili  2010, 2012  . Breast surgery  2001    lumpectomy (01)  . Colonoscopy  09/05/10    cecal avm's  . Esophagogastroduodenoscopy  01/08/2012    Procedure: ESOPHAGOGASTRODUODENOSCOPY (EGD);  Surgeon: [name], MD;  Location: [facility];  Service: Endoscopy;  Laterality: N/A;  . Savory dilation  01/08/2012    Procedure: SAVORY DILATION;  Surgeon: [name], MD;  Location: [facility];  Service: Endoscopy;  Laterality: N/A;  need xray  . Mastectomy modified radical      , Mastectomy modified radical (08), breast reconstruction, CA lesions excised lateral abd wall 2010  . Breast reconstructive surgery  2008, 2009, 2010    Family History  Problem Relation Age of Onset  . Heart disease Father   . Lung cancer Father     smoker  . Cirrhosis Sister     Primary Biliary  . Breast cancer    . Stroke Maternal Grandmother   . Alcohol abuse Maternal Grandfather   . Heart disease Paternal Grandfather   . Anxiety disorder Sister   . Osteoporosis Sister   . Arthritis Sister     Rheumatoid  . Skin cancer Sister   . Osteoporosis Sister   . Cancer Sister     multiple skin cancers, over 90 excisions.  . Other Mother     tic deleauroux  . Anesthesia problems Neg Hx   . Hypotension Neg Hx   . Malignant hyperthermia Neg Hx   . Pseudochol deficiency Neg Hx   . Colon cancer Neg Hx   . Ovarian cancer    . Pancreatic cancer      History   Social History  . Marital Status: Married    Spouse Name: N/A    Number of Children: N/A  . Years of Education: N/A   Occupational History  . Not on file.   Social History Main Topics  . Smoking status: Former Smoker    Types: Cigarettes    Quit date: 05/22/2007  . Smokeless tobacco: Never Used  . Alcohol Use: 0.6 oz/week    1 Glasses of wine per week     Comment: 6 oz daily  . Drug Use:  No  . Sexually Active: Yes -- Female partner(s)   Other Topics Concern  . Not on file   Social  History Narrative  . No narrative on file    Review of Systems:  All systems reviewed.  They are negative to the above problem except as previously stated.  Vital Signs: BP 102/75  Pulse 74  Wt 128 lb (58.06 kg)  BMI 20.67 kg/m2  Physical Exam  HEENT:  Normocephalic, atraumatic. EOMI, PERRLA.  Neck: JVP is normal.  No bruits.  Lungs: clear to auscultation. No rales no wheezes.  Heart: Regular rate and rhythm. Normal S1, S2. No S3.   No significant murmurs. PMI not displaced.  Abdomen:  Supple, nontender. Normal bowel sounds. No masses. No hepatomegaly.  Extremities:   Good distal pulses throughout. No lower extremity edema.  Musculoskeletal :moving all extremities.  Neuro:   alert and oriented x3.  CN II-XII grossly intact.   Assessment and Plan:  1.  Fall/ ? Syncope.  Recent spell is unclear.  On exam today she transiently drops BP I have increased her to increase fluid intake, to cut back on caffeine.  I would also recomm that she increase her salt intake.

## 2013-05-22 NOTE — Patient Instructions (Addendum)
Increase your fluid and salt intake  No changes were made to your medications today  Your physician recommends that you schedule a follow-up appointment in: 3-4 weeks with Dr. Tenny Craw

## 2013-05-27 ENCOUNTER — Ambulatory Visit: Payer: Medicare Other

## 2013-05-27 ENCOUNTER — Other Ambulatory Visit: Payer: Self-pay | Admitting: Lab

## 2013-05-29 ENCOUNTER — Ambulatory Visit (HOSPITAL_BASED_OUTPATIENT_CLINIC_OR_DEPARTMENT_OTHER)
Admission: RE | Admit: 2013-05-29 | Discharge: 2013-05-29 | Disposition: A | Discharge status: Home / Self Care | Payer: BC Managed Care – HMO | Source: Ambulatory Visit | Attending: Family | Admitting: Family

## 2013-05-29 ENCOUNTER — Encounter: Payer: Self-pay | Admitting: Family

## 2013-05-29 ENCOUNTER — Ambulatory Visit (INDEPENDENT_AMBULATORY_CARE_PROVIDER_SITE_OTHER): Payer: BC Managed Care – HMO | Admitting: Family

## 2013-05-29 ENCOUNTER — Other Ambulatory Visit: Payer: Self-pay | Admitting: Family

## 2013-05-29 VITALS — BP 110/70 | HR 95 | Temp 98.3°F | Resp 18 | Wt 131.0 lb

## 2013-05-29 DIAGNOSIS — S3210XA Unspecified fracture of sacrum, initial encounter for closed fracture: Secondary | ICD-10-CM

## 2013-05-29 DIAGNOSIS — I951 Orthostatic hypotension: Secondary | ICD-10-CM

## 2013-05-29 DIAGNOSIS — S3210XB Unspecified fracture of sacrum, initial encounter for open fracture: Secondary | ICD-10-CM

## 2013-05-29 DIAGNOSIS — M533 Sacrococcygeal disorders, not elsewhere classified: Secondary | ICD-10-CM | POA: Insufficient documentation

## 2013-05-29 DIAGNOSIS — W19XXXA Unspecified fall, initial encounter: Secondary | ICD-10-CM | POA: Insufficient documentation

## 2013-05-29 NOTE — Telephone Encounter (Signed)
Reviewed x ray- notes Possible nondisplaced distal sacral fracture.  Home phone line is busy. Unable to leave message on cell.

## 2013-05-29 NOTE — Progress Notes (Signed)
Subjective:    Patient ID: Anne [name], female    DOB: 02-24-1941, 72 y.o.   MRN: [id number]  HPI  Anne [name] is a 72 yr old female who presents today for follow up.  She was evaluated in the ED on 7/9 following a fall on 7/7. The pt was diagnosed with concussion. She apparently slipped on a rug and hit her head. There was possible LOC.  She underwent a CT head in the ED and it was negative.  She then saw Dr. Tenny Craw (cardiology) on 7/11. She recently underwent cardiac work up which included an event monitor (PVC's) and a normal echo.  Dr. Tenny Craw instructed the pt to  Increase her  Fluids and salt intake and decrease her caffeine intake.  Since her recent fall she reports daily headaches and pain on tailbone.  Tailbone hurts with sitting/walking. She has been taking advil with some improvement.   HTN- she reports feeling faint upon standing.  She has only been able to tolerate 1/2 tablet of metoprolol in the evenings.  Reports + fatigue.   Review of Systems See HPI  Past Medical History  Diagnosis Date  . Anemia   . COPD (chronic obstructive pulmonary disease)   . Hyperlipidemia   . Pneumonia 2-10    pleurisy  . Arthritis   . Depression   . GERD (gastroesophageal reflux disease) 09/29/2009    improved s/p cholecystectomy and esophagus dilatation  . History of chicken pox   . History of shingles     2 episodes  . History of measles   . Osteopenia 03/14/2011  . 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 Pure hypercholesterolemia 10/17/2010  . 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 ESOPHAGEAL STRICTURE 03/29/2009  . CAROTID ARTERY STENOSIS 01/13/2010  . ALKALINE PHOSPHATASE, ELEVATED 03/15/2009  . Anxiety and depression 04/28/2011  . Vaginitis 05/22/2011  . Baker's cyst of knee 05/22/2011  . Trauma 10/18/2011  . Radial neck fracture 10/2011    minimally displaced  . Atypical chest pain 11/30/2011  . Folliculitis of nose 12/31/2011  . Esophageal ring   . AVM  (arteriovenous malformation) of colon 2011    cecum  . Urinary incontinence 03/19/2012  . Allergic state 06/10/2012  . Hematuria   . Dysphagia   . URI (upper respiratory infection) 09/02/2012  . Preventative health care 10/21/2012  . Dermatitis 11/23/2012  . Fall 11/23/2012  . Hx of echocardiogram     a. Echo 01/2103: mild LVH, EF 55-60%, normal wall motion, Gr 1 diast dysfn  . Knee pain, bilateral 07/23/2011    History   Social History  . Marital Status: Married    Spouse Name: N/A    Number of Children: N/A  . Years of Education: N/A   Occupational History  . Not on file.   Social History Main Topics  . Smoking status: Former Smoker    Types: Cigarettes    Quit date: 05/22/2007  . Smokeless tobacco: Never Used  . Alcohol Use: 0.6 oz/week    1 Glasses of wine per week     Comment: 6 oz daily  . Drug Use: No  . Sexually Active: Yes -- Female partner(s)   Other Topics Concern  . Not on file   Social History Narrative  . No narrative on file    Past Surgical History  Procedure Laterality Date  . Anterior cruciate ligament repair  08, 09, 10  . Ercp, cbd stone extraction  2010  . Cholecystectomy  2010  . Egd/ dili  2010, 2012  . Breast surgery  2001    lumpectomy (01)  . Colonoscopy  09/05/10    cecal avm's  . Esophagogastroduodenoscopy  01/08/2012    Procedure: ESOPHAGOGASTRODUODENOSCOPY (EGD);  Surgeon: [name], MD;  Location: [facility];  Service: Endoscopy;  Laterality: N/A;  . Savory dilation  01/08/2012    Procedure: SAVORY DILATION;  Surgeon: [name], MD;  Location: [facility];  Service: Endoscopy;  Laterality: N/A;  need xray  . Mastectomy modified radical      , Mastectomy modified radical (08), breast reconstruction, CA lesions excised lateral abd wall 2010  . Breast reconstructive surgery  2008, 2009, 2010    Family History  Problem Relation Age of Onset  . Heart disease Father   . Lung cancer Father     smoker  . Cirrhosis Sister      Primary Biliary  . Breast cancer    . Stroke Maternal Grandmother   . Alcohol abuse Maternal Grandfather   . Heart disease Paternal Grandfather   . Anxiety disorder Sister   . Osteoporosis Sister   . Arthritis Sister     Rheumatoid  . Skin cancer Sister   . Osteoporosis Sister   . Cancer Sister     multiple skin cancers, over 90 excisions.  . Other Mother     tic deleauroux  . Anesthesia problems Neg Hx   . Hypotension Neg Hx   . Malignant hyperthermia Neg Hx   . Pseudochol deficiency Neg Hx   . Colon cancer Neg Hx   . Ovarian cancer    . Pancreatic cancer      Allergies  Allergen Reactions  . Codeine Other (See Comments)    "flu like symptoms"  . Diphenhydramine Hcl Other (See Comments)    "nervous and upset"  . Morphine Other (See Comments)    "flu like symptoms"  . Peanut-Containing Drug Products Hives  . Zofran Other (See Comments)    HEADACHE    Current Outpatient Prescriptions on File Prior to Visit  Medication Sig Dispense Refill  . amitriptyline (ELAVIL) 150 MG tablet 1TY BY MOUTH AT BEDTIME  30 tablet  3  . anastrozole (ARIMIDEX) 1 MG tablet Take 1 tablet (1 mg total) by mouth daily.  90 tablet  2  . aspirin 81 MG tablet Take 81 mg by mouth daily.        [name] atorvastatin (LIPITOR) 40 MG tablet Take 1 tablet (40 mg total) by mouth daily.  90 tablet  3  . beta carotene 16109 UNIT capsule Take 25,000 Units by mouth daily.        . calcium carbonate (TUMS - DOSED IN MG ELEMENTAL CALCIUM) 500 MG chewable tablet Chew 750 mg by mouth 2 (two) times daily.       . Cholecalciferol (VITAMIN D3) 1000 UNITS CAPS Take 2 tablets by mouth daily.        [name] doxycycline (VIBRA-TABS) 100 MG tablet Take 1 tablet (100 mg total) by mouth 2 (two) times daily.  14 tablet  0  . ibuprofen (ADVIL,MOTRIN) 800 MG tablet Take 800 mg by mouth as needed.      [name] LORazepam (ATIVAN) 1 MG tablet TAKE 1 TABLET BY MOUTH TWICE A DAY  90 tablet  1  . metoprolol tartrate (LOPRESSOR) 25 MG tablet Take 1  whole tablet in the morning and 1/2 in the evening.  30 tablet  1  .  pantoprazole (PROTONIX) 40 MG tablet Take 1 tablet (40 mg total) by mouth 2 (two) times daily before a meal. 30-60 minutes before breakfast and supper  180 tablet  3  . Probiotic Product (PROBIOTIC FORMULA PO) Take 1 capsule by mouth daily.      . vitamin B-12 (CYANOCOBALAMIN) 1000 MCG tablet Take 1,000 mcg by mouth daily.         No current facility-administered medications on file prior to visit.    BP 110/70  Pulse 95  Temp(Src) 98.3 F (36.8 C) (Oral)  Resp 18  Wt 131 lb (59.421 kg)  BMI 21.15 kg/m2  SpO2 97%       Objective:   Physical Exam  Constitutional: She is oriented to person, place, and time. She appears well-developed and well-nourished. No distress.  Cardiovascular: Normal rate and regular rhythm.   No murmur heard. Pulmonary/Chest: Effort normal and breath sounds normal. No respiratory distress. She has no wheezes. She has no rales. She exhibits no tenderness.  Musculoskeletal: She exhibits no edema.  Neurological: She is alert and oriented to person, place, and time.  Psychiatric: She has a normal mood and affect. Her behavior is normal. Judgment and thought content normal.          Assessment & Plan:

## 2013-05-29 NOTE — Patient Instructions (Addendum)
Hold metoprolol. Follow up in 2 weeks.

## 2013-05-31 ENCOUNTER — Telehealth: Payer: Self-pay | Admitting: Family

## 2013-05-31 DIAGNOSIS — S3210XA Unspecified fracture of sacrum, initial encounter for closed fracture: Secondary | ICD-10-CM | POA: Insufficient documentation

## 2013-05-31 NOTE — Telephone Encounter (Signed)
Please call pt and let her know that her x ray shows possible fracture near the end of her tail bone.  Generally this will improve with time.  If she would like to know for sure we can do some additional more detailed imaging, possibly MRI- though this is unlikely to change what we do for her.  I have pended rx below for lidoderm patch. She can place patch over sacral area 12 hours a day- (put on when she gets up in the am) to help with pain.

## 2013-05-31 NOTE — Telephone Encounter (Signed)
Message copied by Sandford Craze on Sun May 31, 2013 11:02 AM ------      Message from: [name] A      Created: Sun May 31, 2013  9:52 AM       Findings are subtle enough that it is hard to know. The reality is for a subtle stress fracture we would not do anything but rest and pain meds, topicals such as [place] patches OTC or Lidoderm patches could be helpful. If she wanted more information we would have to do more extensive imaging and would ask radiology if bone scan vs mri would be preferred. I would be happy to have Christy call her this week and see what she wants to do. Let me know. Certainly no urgency here just time is what she needs poor thing.       ----- Message -----         From: Sandford Craze, NP         Sent: 05/31/2013   8:53 AM           To: Bradd Canary, MD            Please see x ray.  Nothing to do for sacral fracture, right?            Thanks!       ------

## 2013-06-01 MED ORDER — LIDOCAINE 5 % EX PTCH: 1.0000 | MEDICATED_PATCH | CUTANEOUS | Status: DC

## 2013-06-01 NOTE — Telephone Encounter (Signed)
FYI: Patient informed, understood & agreed to try Lidocaine patch and see if will heal well enough w/o having to have more extensive testing done; if no improvement, will call back to request order; Rx to pharmacy/[facility]

## 2013-06-03 DIAGNOSIS — I951 Orthostatic hypotension: Secondary | ICD-10-CM | POA: Insufficient documentation

## 2013-06-03 NOTE — Assessment & Plan Note (Signed)
BP in office as follows: Suspect this was cause of her recent fall/concussion.   Orthostatics:  Laying flat  HR 93     BP  126/75 Sitting       HR 113   BP    88/62. (pt very dizzy)  I have advised the pt to discontinue the metoprolol. She is currently only taking 12.5 mg HS. If no improvement with discontinuation of metoprolol, could consider Compression stockings +/- addition of florinef.

## 2013-06-03 NOTE — Assessment & Plan Note (Signed)
Pt with sacral pain s/p fall. X-ray reveals possible sacral fracture. Will plan conservative management- see phone note.

## 2013-06-11 ENCOUNTER — Encounter: Payer: Self-pay | Admitting: Internal Medicine

## 2013-06-17 ENCOUNTER — Other Ambulatory Visit: Payer: Self-pay

## 2013-06-22 ENCOUNTER — Ambulatory Visit (INDEPENDENT_AMBULATORY_CARE_PROVIDER_SITE_OTHER): Payer: BC Managed Care – HMO | Admitting: Family Medicine

## 2013-06-22 ENCOUNTER — Ambulatory Visit: Payer: Medicare Other | Admitting: Internal Medicine

## 2013-06-22 ENCOUNTER — Encounter: Payer: Self-pay | Admitting: Family Medicine

## 2013-06-22 ENCOUNTER — Ambulatory Visit: Payer: Medicare HMO | Admitting: Physician Assistant

## 2013-06-22 VITALS — BP 90/61 | HR 97 | Temp 98.4°F | Resp 16 | Ht 66.0 in | Wt 135.0 lb

## 2013-06-22 DIAGNOSIS — I951 Orthostatic hypotension: Secondary | ICD-10-CM

## 2013-06-22 DIAGNOSIS — R350 Frequency of micturition: Secondary | ICD-10-CM

## 2013-06-22 LAB — POCT URINALYSIS DIPSTICK
Ketones, UA: NEGATIVE
Protein, UA: NEGATIVE
Spec Grav, UA: 1.015
Urobilinogen, UA: 0.2
pH, UA: 7

## 2013-06-22 NOTE — Progress Notes (Signed)
OFFICE NOTE  06/22/2013  CC:  Chief Complaint  Patient presents with  . Near Syncope    upon standing  . Hypotension     HPI: Patient is a 72 y.o. Caucasian female who is here for feeling dizzy when she stands. Apparently has been having this complaint ever since getting on metoprolol (?months ago) after a cardiac eval for palpitations turned up frequent PVCs on holter monitoring. She has not had much in the way of palpitations anymore, but she has seen her PCP x 2 and her cardiologist once and then is here again today for a consistent feeling of lightheadedness/disequilibrium when going from supine to upright position or from sitting up right to standing.  Lasts a half a minute or so at the most and then goes away.  BP at home and at office visits consistently low/low normal range, consistently near tachycardia.  Metoprolol dose has been cut back to just a half 25mg  tab qhs and she had been without any symptoms since this change x about 3 wks---then yesterday she went for a 1-2 mile walk w/husband and today woke up with a HA (went away with two advil) and the orthostatic sx's have returned.  Of note, she has not increased her fluid intake nor has she cut back on caffeine as she was instructed.  She has been trying to eat a diet high in sodium.  She denies chest pain, palpitations, SOB, fever, abd pain, nausea, URI/cough, rash, diarrhea, melena, or hematochezia.  She does say that last night and this morning she felt a bit of increased urgency to urinate and she urinated a bit more often than usual last night and today.  No dysuria or hematuria.  Caffeine intake: 6 espresso shots a day in various types of coffee drinks, approx 16 oz of pepsi, and unknown amount of tea.  No recent new meds except metoprolol.  She is on amitriptyline, which can also cause orthostatic hypotension but she has been on this med x 30 yrs.  Pertinent PMH:  Past Medical History  Diagnosis Date  . Anemia   . COPD  (chronic obstructive pulmonary disease)   . Hyperlipidemia   . Pneumonia 2-10    pleurisy  . Arthritis   . Depression   . GERD (gastroesophageal reflux disease) 09/29/2009    improved s/p cholecystectomy and esophagus dilatation  . History of chicken pox   . History of shingles     2 episodes  . History of measles   . Osteopenia 03/14/2011  . 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 Pure hypercholesterolemia 10/17/2010  . 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 ESOPHAGEAL STRICTURE 03/29/2009  . CAROTID ARTERY STENOSIS 01/13/2010  . ALKALINE PHOSPHATASE, ELEVATED 03/15/2009  . Anxiety and depression 04/28/2011  . Vaginitis 05/22/2011  . Baker's cyst of knee 05/22/2011  . Trauma 10/18/2011  . Radial neck fracture 10/2011    minimally displaced  . Atypical chest pain 11/30/2011  . Folliculitis of nose 12/31/2011  . Esophageal ring   . 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 Urinary incontinence 03/19/2012  . Allergic state 06/10/2012  . Hematuria   . Dysphagia   . URI (upper respiratory infection) 09/02/2012  . Preventative health care 10/21/2012  . Dermatitis 11/23/2012  . Fall 11/23/2012  . Hx of echocardiogram     a. Echo 01/2103: mild LVH, EF 55-60%, normal wall motion, Gr 1 diast dysfn  .  Knee pain, bilateral 07/23/2011    MEDS:  Outpatient Prescriptions Prior to Visit  Medication Sig Dispense Refill  . amitriptyline (ELAVIL) 150 MG tablet 1TY BY MOUTH AT BEDTIME  30 tablet  3  . anastrozole (ARIMIDEX) 1 MG tablet Take 1 tablet (1 mg total) by mouth daily.  90 tablet  2  . aspirin 81 MG tablet Take 81 mg by mouth daily.        [name] atorvastatin (LIPITOR) 40 MG tablet Take 1 tablet (40 mg total) by mouth daily.  90 tablet  3  . beta carotene 45409 UNIT capsule Take 25,000 Units by mouth daily.        . calcium carbonate (TUMS - DOSED IN MG ELEMENTAL CALCIUM) 500 MG chewable tablet Chew 750 mg by mouth 2 (two) times daily.       . Cholecalciferol  (VITAMIN D3) 1000 UNITS CAPS Take 2 tablets by mouth daily.        [name] LORazepam (ATIVAN) 1 MG tablet TAKE 1 TABLET BY MOUTH TWICE A DAY  90 tablet  1  . pantoprazole (PROTONIX) 40 MG tablet Take 1 tablet (40 mg total) by mouth 2 (two) times daily before a meal. 30-60 minutes before breakfast and supper  180 tablet  3  . Probiotic Product (PROBIOTIC FORMULA PO) Take 1 capsule by mouth daily.      . vitamin B-12 (CYANOCOBALAMIN) 1000 MCG tablet Take 1,000 mcg by mouth daily.        . metoprolol tartrate (LOPRESSOR) 25 MG tablet Take 1 whole tablet in the morning and 1/2 in the evening.  30 tablet  1  . doxycycline (VIBRA-TABS) 100 MG tablet Take 1 tablet (100 mg total) by mouth 2 (two) times daily.  14 tablet  0  . ibuprofen (ADVIL,MOTRIN) 800 MG tablet Take 800 mg by mouth as needed.      . lidocaine (LIDODERM) 5 % Place 1 patch onto the skin daily. Put on every morning. Remove & Discard patch within 12 hours or as directed by MD  30 patch  0   No facility-administered medications prior to visit.    PE: Blood pressure 90/61, pulse 97, temperature 98.4 F (36.9 C), temperature source Temporal, resp. rate 16, height 5\' 6"  (1.676 m), weight 135 lb (61.236 kg), SpO2 99.00%. Orthostatics: supine 98/66, P 87, upright 99/68, P 93, standing 90/60, P 97 (patient dizzy when sitting upright and going from sitting to standing) Gen: Alert, well appearing.  Patient is oriented to person, place, time, and situation. ENT:  Eyes: no injection, icteris, swelling, or exudate.  EOMI, PERRLA. Nose: no drainage or turbinate edema/swelling.  No injection or focal lesion.  Mouth: lips without lesion/swelling.  Oral mucosa pink and moist.   Oropharynx without erythema, exudate, or swelling.  Neck: supple/nontender.  No LAD, mass, or TM.  Carotid pulses 1+ bilaterally, without bruits. CV: RRR, no m/r/g.  No ectopy. LUNGS: CTA bilat, nonlabored resps, good aeration in all lung fields. ABD: soft, NT, ND, BS normal.  No  hepatospenomegaly or mass.  No bruits. EXT: no clubbing, cyanosis, or edema.   LAB: CC UA today showed trace intact blood but was otherwise normal.  Lab Results  Component Value Date   WBC 7.4 05/20/2013   HGB 11.8* 05/20/2013   HCT 36.4 05/20/2013   MCV 95.5 05/20/2013   PLT 202 05/20/2013   Lab Results  Component Value Date   CREATININE 0.70 05/20/2013   IMPRESSION AND PLAN:  Orthostatic hypotension Secondary  to metoprolol + physical strain/exhaustion from walking yesterday. She is also still not drinking much clear liquids and is drinking too much caffeine. Plan is to d/c metoprolol altogether, cut back aggressively on caffeine intake over the next 2 wks, work on increasing water/clear liquids intake to 64 ounces per day, and continue high sodium diet.  When compression stockings were mentioned she was against this unless it was winter and they could be hidden under long pants.  We'll see how she feels over the next 1-2 wks and she'll f/u with me after that. If bp ok and dizziness/orthostatic sx's resolve and her palpitations return then will need to consider trial of CCB and/or consult Dr. Tenny Craw for further recommendations.  Urinalysis today not suggestive of infection--trace intact blood--sent for c/s but no meds started.   An After Visit Summary was printed and given to the patient.  FOLLOW UP: 1-2 wks

## 2013-06-22 NOTE — Patient Instructions (Addendum)
Aggressively cut back on caffeine over the next 2 wks. Stop metoprolol. Increase your intake of water--shoot for 64 ounces in a day. Continue high sodium intake. See how you feel over the next week.

## 2013-06-22 NOTE — Assessment & Plan Note (Signed)
Secondary to metoprolol + physical strain/exhaustion from walking yesterday. She is also still not drinking much clear liquids and is drinking too much caffeine. Plan is to d/c metoprolol altogether, cut back aggressively on caffeine intake over the next 2 wks, work on increasing water/clear liquids intake to 64 ounces per day, and continue high sodium diet.  When compression stockings were mentioned she was against this unless it was winter and they could be hidden under long pants.  We'll see how she feels over the next 1-2 wks and she'll f/u with me after that. If bp ok and dizziness/orthostatic sx's resolve and her palpitations return then will need to consider trial of CCB and/or consult Dr. Tenny Craw for further recommendations.  Urinalysis today not suggestive of infection--trace intact blood--sent for c/s but no meds started.

## 2013-06-24 LAB — URINE CULTURE: Colony Count: NO GROWTH

## 2013-06-25 ENCOUNTER — Telehealth: Payer: Self-pay | Admitting: Oncology

## 2013-06-26 ENCOUNTER — Ambulatory Visit (HOSPITAL_BASED_OUTPATIENT_CLINIC_OR_DEPARTMENT_OTHER): Payer: BC Managed Care – HMO | Admitting: Oncology

## 2013-06-26 ENCOUNTER — Telehealth: Payer: Self-pay

## 2013-06-26 ENCOUNTER — Telehealth: Payer: Self-pay | Admitting: Oncology

## 2013-06-26 ENCOUNTER — Encounter: Payer: Self-pay | Admitting: Oncology

## 2013-06-26 ENCOUNTER — Ambulatory Visit (HOSPITAL_BASED_OUTPATIENT_CLINIC_OR_DEPARTMENT_OTHER): Payer: BC Managed Care – HMO | Admitting: Lab

## 2013-06-26 VITALS — BP 97/63 | HR 106 | Temp 97.9°F | Resp 20 | Ht 67.0 in | Wt 134.0 lb

## 2013-06-26 DIAGNOSIS — R5383 Other fatigue: Secondary | ICD-10-CM

## 2013-06-26 DIAGNOSIS — R29898 Other symptoms and signs involving the musculoskeletal system: Secondary | ICD-10-CM

## 2013-06-26 DIAGNOSIS — Z853 Personal history of malignant neoplasm of breast: Secondary | ICD-10-CM

## 2013-06-26 DIAGNOSIS — R42 Dizziness and giddiness: Secondary | ICD-10-CM

## 2013-06-26 LAB — CBC WITH DIFFERENTIAL/PLATELET
BASO%: 0.6 % (ref 0.0–2.0)
EOS%: 1.1 % (ref 0.0–7.0)
HCT: 32.7 % — ABNORMAL LOW (ref 34.8–46.6)
LYMPH%: 22.1 % (ref 14.0–49.7)
MCH: 31.2 pg (ref 25.1–34.0)
MCHC: 33.9 g/dL (ref 31.5–36.0)
MCV: 91.8 fL (ref 79.5–101.0)
MONO%: 9.2 % (ref 0.0–14.0)
NEUT%: 67 % (ref 38.4–76.8)
Platelets: 272 10*3/uL (ref 145–400)
RBC: 3.56 10*6/uL — ABNORMAL LOW (ref 3.70–5.45)
WBC: 8.4 10*3/uL (ref 3.9–10.3)

## 2013-06-26 LAB — COMPREHENSIVE METABOLIC PANEL (CC13)
ALT: 43 U/L (ref 0–55)
AST: 33 U/L (ref 5–34)
Alkaline Phosphatase: 133 U/L (ref 40–150)
CO2: 25 mEq/L (ref 22–29)
Creatinine: 0.8 mg/dL (ref 0.6–1.1)
Sodium: 139 mEq/L (ref 136–145)
Total Bilirubin: 0.47 mg/dL (ref 0.20–1.20)
Total Protein: 7.6 g/dL (ref 6.4–8.3)

## 2013-06-26 NOTE — Telephone Encounter (Signed)
FYI:  Patients spouse called and stated that pt feet feel "heavy and out of control". Pt is going to see her oncologist today at 1. I offered pts spouse an appt for 3:45 pm today but he was afraid he wouldn't have time. Appt given for Monday 06-29-13 at 2:30 PM w/Melissa- due to Dr Abner Greenspan being busy. I did inform pts spouse to take pt to ED if this gets worse.

## 2013-06-26 NOTE — Progress Notes (Signed)
OFFICE PROGRESS NOTE  CCDanise Edge, MD [address] 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  #2 second diagnosis was in January 2008 patient did not undergo surgery until July 2008. She also did not receive any adjuvant systemic treatment for her decision. Her course was complicated with difficulty healing after her right breast reconstruction. She had skin recurrence lateral chest wall excised in September 2000 and then treated with Xeloda from October 2010 through February 2011. Patient has been on Arimidex since July 2011.  #3  CURRENT THERAPY: Arimidex 1 mg daily  INTERVAL HISTORY: Anne [name] 72 y.o. female returns for followup visit and to establish your care. Patient states that she has been very weak tired and fatigued. She is especially weak in the legs. She's had dizziness as well. She has had multiple workups including from her primary care physician as well as Dr. Elenore Paddy from GI. Today she feels a little bit better she has no nausea or vomiting no comedo pain. No fevers or chills. Remainder of the 10 point review of systems is negative.  MEDICAL HISTORY: Past Medical History  Diagnosis Date  . Anemia   . COPD (chronic obstructive pulmonary disease)   . Hyperlipidemia   . Pneumonia 2-10    pleurisy  . Arthritis   . Depression   . GERD (gastroesophageal reflux disease) 09/29/2009    improved s/p cholecystectomy and esophagus dilatation  . History of chicken pox   . History of shingles     2 episodes  . History of measles   . Osteopenia 03/14/2011  . 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 Pure hypercholesterolemia 10/17/2010  . PERSONAL HX BREAST CANCER  09/29/2009  . ESOPHAGEAL STRICTURE 03/29/2009  . CAROTID ARTERY STENOSIS 01/13/2010  . ALKALINE PHOSPHATASE, ELEVATED 03/15/2009  . Anxiety and depression 04/28/2011  . Vaginitis 05/22/2011  . Baker's cyst of knee 05/22/2011  . Trauma 10/18/2011  . Radial neck fracture 10/2011    minimally displaced  . Atypical chest pain 11/30/2011  . Folliculitis of nose 12/31/2011  . Esophageal ring   . 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 Urinary incontinence 03/19/2012  . Allergic state 06/10/2012  . Hematuria   . Dysphagia   . URI (upper respiratory infection) 09/02/2012  . Preventative health care 10/21/2012  . Dermatitis 11/23/2012  . Fall 11/23/2012  . Hx of echocardiogram     a. Echo 01/2103: mild LVH, EF 55-60%, normal wall motion, Gr 1 diast dysfn  . Knee pain, bilateral 07/23/2011    ALLERGIES:  is allergic to codeine; diphenhydramine hcl; morphine; peanut-containing drug products; and zofran.  MEDICATIONS:  Current Outpatient Prescriptions  Medication Sig Dispense Refill  . amitriptyline (ELAVIL) 150 MG tablet 1TY BY MOUTH AT BEDTIME  30 tablet  3  . anastrozole (ARIMIDEX) 1 MG tablet Take 1 tablet (1 mg total) by mouth daily.  90 tablet  2  . aspirin 81 MG tablet Take 81 mg by mouth daily.        [name] atorvastatin (LIPITOR) 40 MG tablet Take 1 tablet (40 mg total) by mouth daily.  90 tablet  3  . Cholecalciferol (VITAMIN D3)  1000 UNITS CAPS Take 2 tablets by mouth daily.        [name] LORazepam (ATIVAN) 1 MG tablet TAKE 1 TABLET BY MOUTH TWICE A DAY  90 tablet  1  . pantoprazole (PROTONIX) 40 MG tablet Take 1 tablet (40 mg total) by mouth 2 (two) times daily before a meal. 30-60 minutes before breakfast and supper  180 tablet  3  . Probiotic Product (PROBIOTIC FORMULA PO) Take 1 capsule by mouth daily.      . vitamin B-12 (CYANOCOBALAMIN) 1000 MCG tablet Take 1,000 mcg by mouth daily.        . calcium carbonate (TUMS - DOSED IN MG ELEMENTAL CALCIUM) 500 MG chewable tablet Chew 750  mg by mouth 2 (two) times daily.       [name] ibuprofen (ADVIL,MOTRIN) 800 MG tablet Take 800 mg by mouth as needed.      . lidocaine (LIDODERM) 5 % Place 1 patch onto the skin daily. Put on every morning. Remove & Discard patch within 12 hours or as directed by MD  30 patch  0   No current facility-administered medications for this visit.    SURGICAL HISTORY:  Past Surgical History  Procedure Laterality Date  . Anterior cruciate ligament repair  08, 09, 10  . Ercp, cbd stone extraction  2010  . Cholecystectomy  2010  . Egd/ dili  2010, 2012  . Breast surgery  2001    lumpectomy (01)  . Colonoscopy  09/05/10    cecal [facility]  . Esophagogastroduodenoscopy  01/08/2012    Procedure: ESOPHAGOGASTRODUODENOSCOPY (EGD);  Surgeon: [name], MD;  Location: [facility];  Service: Endoscopy;  Laterality: N/A;  . Savory dilation  01/08/2012    Procedure: SAVORY DILATION;  Surgeon: [name], MD;  Location: [facility];  Service: Endoscopy;  Laterality: N/A;  need xray  . Mastectomy modified radical      , Mastectomy modified radical (08), breast reconstruction, CA lesions excised lateral abd wall 2010  . Breast reconstructive surgery  2008, 2009, 2010    REVIEW OF SYSTEMS:  Pertinent items are noted in HPI.   HEALTH MAINTENANCE:  PHYSICAL EXAMINATION: Blood pressure 97/63, pulse 106, temperature 97.9 F (36.6 C), temperature source Oral, resp. rate 20, height 5\' 7"  (1.702 m), weight 134 lb (60.782 kg). Body mass index is 20.98 kg/(m^2). ECOG PERFORMANCE STATUS: 1 - Symptomatic but completely ambulatory   General appearance: alert, cooperative and appears stated age Lymph nodes: Cervical, supraclavicular, and axillary nodes normal. Resp: clear to auscultation bilaterally Cardio: regular rate and rhythm GI: soft, non-tender; bowel sounds normal; no masses,  no organomegaly Extremities: extremities normal, atraumatic, no cyanosis or edema Neurologic: Grossly normal   LABORATORY  DATA: Lab Results  Component Value Date   WBC 8.4 06/26/2013   HGB 11.1* 06/26/2013   HCT 32.7* 06/26/2013   MCV 91.8 06/26/2013   PLT 272 06/26/2013      Chemistry      Component Value Date/Time   NA 139 06/26/2013 1321   NA 140 05/20/2013 1526   K 3.8 06/26/2013 1321   K 3.7 05/20/2013 1526   CL 100 05/20/2013 1526   CL 102 11/28/2012 1043   CO2 25 06/26/2013 1321   CO2 28 05/20/2013 1526   BUN 13.9 06/26/2013 1321   BUN 13 05/20/2013 1526   CREATININE 0.8 06/26/2013 1321   CREATININE 0.70 05/20/2013 1526   CREATININE 0.71 09/12/2011 1559      Component Value Date/Time  CALCIUM 9.8 06/26/2013 1321   CALCIUM 10.0 05/20/2013 1526   ALKPHOS 133 06/26/2013 1321   ALKPHOS 105 01/13/2013 1210   AST 33 06/26/2013 1321   AST 18 01/13/2013 1210   ALT 43 06/26/2013 1321   ALT 16 01/13/2013 1210   BILITOT 0.47 06/26/2013 1321   BILITOT 0.6 01/13/2013 1210       RADIOGRAPHIC STUDIES:  Dg Sacrum/coccyx  05/29/2013   *RADIOLOGY REPORT*  Clinical Data: Fall.  Coccydynia.  SACRUM AND COCCYX - 2+ VIEW  Comparison:  None.  Findings: There is no coccygeal dislocation.  There is a subtle lucency across the distal sacrum (arrow) seen on the lateral view, which could represent a nondisplaced fracture.  Correlate clinically.  IMPRESSION: Possible nondisplaced distal sacral fracture.   Original Report Authenticated By: Davonna Belling, M.D.    ASSESSMENT: 72 year old female with  #1 original diagnosis of breast cancer in the left breast in 2001 she received lumpectomy followed by adjuvant chemotherapy consisting of CMF.  #2 and 2008 patient had recurrent disease she declined surgery until July 2008. She then went on to have radiation with Xeloda. She has been on Arimidex 1 mg daily. So far tolerating it well.   PLAN:   #1 patient is very concerned about recurrent disease I will set her up for scans.  #2 she'll be seen back in a few months time for follow   All questions were answered. The patient knows to call the  clinic with any problems, questions or concerns. We can certainly see the patient much sooner if necessary.  I spent 25 minutes counseling the patient face to face. The total time spent in the appointment was 30 minutes.    Drue Second, MD Medical/Oncology [facility] [phone] (beeper) [phone] (Office)  06/26/2013, 2:21 PM

## 2013-06-26 NOTE — Telephone Encounter (Signed)
, °

## 2013-06-29 ENCOUNTER — Ambulatory Visit: Payer: BC Managed Care – HMO | Admitting: Family

## 2013-06-29 ENCOUNTER — Other Ambulatory Visit: Payer: Self-pay | Admitting: Family Medicine

## 2013-06-30 ENCOUNTER — Ambulatory Visit: Payer: BC Managed Care – HMO | Admitting: Family Medicine

## 2013-07-03 ENCOUNTER — Ambulatory Visit: Payer: Self-pay | Admitting: Oncology

## 2013-07-03 ENCOUNTER — Ambulatory Visit (HOSPITAL_COMMUNITY)
Admission: RE | Admit: 2013-07-03 | Discharge: 2013-07-03 | Disposition: A | Discharge status: Home / Self Care | Payer: Medicare Other | Source: Ambulatory Visit | Attending: Oncology | Admitting: Oncology

## 2013-07-03 ENCOUNTER — Telehealth: Payer: Self-pay | Admitting: Medical Oncology

## 2013-07-03 ENCOUNTER — Other Ambulatory Visit: Payer: Self-pay | Admitting: Lab

## 2013-07-03 ENCOUNTER — Encounter (HOSPITAL_COMMUNITY): Payer: Self-pay

## 2013-07-03 DIAGNOSIS — Z978 Presence of other specified devices: Secondary | ICD-10-CM | POA: Insufficient documentation

## 2013-07-03 DIAGNOSIS — C50919 Malignant neoplasm of unspecified site of unspecified female breast: Secondary | ICD-10-CM | POA: Insufficient documentation

## 2013-07-03 DIAGNOSIS — Z853 Personal history of malignant neoplasm of breast: Secondary | ICD-10-CM

## 2013-07-03 DIAGNOSIS — Z9010 Acquired absence of unspecified breast and nipple: Secondary | ICD-10-CM | POA: Insufficient documentation

## 2013-07-03 DIAGNOSIS — J438 Other emphysema: Secondary | ICD-10-CM | POA: Insufficient documentation

## 2013-07-03 MED ORDER — IOHEXOL 300 MG/ML  SOLN
100.0000 mL | Freq: Once | INTRAMUSCULAR | Status: AC | PRN
  Administered 2013-07-03: 100 mL via INTRAVENOUS

## 2013-07-03 NOTE — Telephone Encounter (Signed)
Patient called for results to CT of Ches/Abdomen/Pelvis from today. Review with Anne [name], AAP, per AAP informed patient results normal. Patient denies further needs, knows to call office with any questions or concerns.

## 2013-07-14 ENCOUNTER — Ambulatory Visit (INDEPENDENT_AMBULATORY_CARE_PROVIDER_SITE_OTHER): Payer: Medicare Other | Admitting: Physician Assistant

## 2013-07-14 ENCOUNTER — Encounter: Payer: Self-pay | Admitting: Physician Assistant

## 2013-07-14 VITALS — BP 97/65 | HR 94 | Ht 67.0 in | Wt 134.8 lb

## 2013-07-14 DIAGNOSIS — R002 Palpitations: Secondary | ICD-10-CM

## 2013-07-14 DIAGNOSIS — I951 Orthostatic hypotension: Secondary | ICD-10-CM

## 2013-07-14 DIAGNOSIS — I6529 Occlusion and stenosis of unspecified carotid artery: Secondary | ICD-10-CM

## 2013-07-14 DIAGNOSIS — E785 Hyperlipidemia, unspecified: Secondary | ICD-10-CM

## 2013-07-14 MED ORDER — DILTIAZEM HCL 30 MG PO TABS: 30.0000 mg | ORAL_TABLET | Freq: Every day | ORAL | Status: DC | PRN

## 2013-07-14 NOTE — Progress Notes (Signed)
[address] [place], [place]  [address] Phone: [phone] Fax:  [phone]  Date:  07/14/2013   ID:  Anne, [name] 1940-11-23, MRN [id number]  PCP:  [name], MD  Cardiologist:  Dr. Dietrich Pates     History of Present Illness: Anne [name] is a 72 y.o. female who returns for followup.  She has a hx of orthostatic intolerance, carotid stenosis, HL, COPD, anemia, GERD, eosinophilic esophagitis, breast CA. Echo 3/14: Mild LVH, EF 55-60%, grade 1 diastolic dysfunction. Event monitor 01/2013: NSR, extensive PVCs. Carotid US 3/14: RICA 0-39%, LICA 40-59% (f/u 1 year). Myoview 4/14: Low risk, no scar or ischemia, not gated.  She has been treated with low dose metoprolol for PVCs and palps.  Last seen by Dr. Tenny Craw 7/14. Prior to that, she had been to the emergency room after a fall. She hit her head.  She was noted to have transient drop in blood pressure. She was asked to cut back on caffeine increase her fluid intake and salt intake. Followup was recommended in 3-4 weeks.  Patient tells me that she felt worse on metoprolol. This has since been discontinued.  She still has occasional palpitations. She did try to take metoprolol when necessary at one point. However, this continued to cause significant side effects. It does not sound as though she has not cut back on any of her caffeine intake. She does admit to increased fluid intake and salt intake. She denies chest pain. She denies significant dyspnea. She is NYHA class II. She denies orthopnea, PND or edema. She denies syncope  Labs (3/14):  K 4, creatinine 0.8, ALT 16, LDL 117, Hgb 12.5, TSH 0.61  Labs (7/14):  K 3.7, Cr 0.70, Hgb 11.8 Labs (8/14):  K 3.8, Cr 0.8, ALT 43, Hgb 11.1  Wt Readings from Last 3 Encounters:  06/26/13 134 lb (60.782 kg)  06/22/13 135 lb (61.236 kg)  05/29/13 131 lb (59.421 kg)     Past Medical History  Diagnosis Date  . Anemia   . COPD (chronic obstructive pulmonary disease)     . Hyperlipidemia   . Pneumonia 2-10    pleurisy  . Arthritis   . Depression   . GERD (gastroesophageal reflux disease) 09/29/2009    improved s/p cholecystectomy and esophagus dilatation  . History of chicken pox   . History of shingles     2 episodes  . History of measles   . Osteopenia 03/14/2011  . 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 Pure hypercholesterolemia 10/17/2010  . 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 ESOPHAGEAL STRICTURE 03/29/2009  . CAROTID ARTERY STENOSIS 01/13/2010  . ALKALINE PHOSPHATASE, ELEVATED 03/15/2009  . Anxiety and depression 04/28/2011  . Vaginitis 05/22/2011  . Baker's cyst of knee 05/22/2011  . Trauma 10/18/2011  . Radial neck fracture 10/2011    minimally displaced  . Atypical chest pain 11/30/2011  . Folliculitis of nose 12/31/2011  . Esophageal ring   . 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 Urinary incontinence 03/19/2012  . Allergic state 06/10/2012  . Hematuria   . Dysphagia   . URI (upper respiratory infection) 09/02/2012  . Preventative health care 10/21/2012  . Dermatitis 11/23/2012  . Fall 11/23/2012  . Hx of echocardiogram     a. Echo 01/2103: mild LVH, EF 55-60%, normal wall motion, Gr 1 diast dysfn  . Knee pain, bilateral 07/23/2011  Current Outpatient Prescriptions  Medication Sig Dispense Refill  . amitriptyline (ELAVIL) 150 MG tablet take 1 tablet by mouth at bedtime  30 tablet  2  . anastrozole (ARIMIDEX) 1 MG tablet Take 1 tablet (1 mg total) by mouth daily.  90 tablet  2  . aspirin 81 MG tablet Take 81 mg by mouth daily.        [name] atorvastatin (LIPITOR) 40 MG tablet Take 1 tablet (40 mg total) by mouth daily.  90 tablet  3  . calcium carbonate (TUMS - DOSED IN MG ELEMENTAL CALCIUM) 500 MG chewable tablet Chew 750 mg by mouth 2 (two) times daily.       . Cholecalciferol (VITAMIN D3) 1000 UNITS CAPS Take 2 tablets by mouth daily.        [name] ibuprofen (ADVIL,MOTRIN) 800 MG tablet Take 800 mg by  mouth as needed.      . lidocaine (LIDODERM) 5 % Place 1 patch onto the skin daily. Put on every morning. Remove & Discard patch within 12 hours or as directed by MD  30 patch  0  . LORazepam (ATIVAN) 1 MG tablet TAKE 1 TABLET BY MOUTH TWICE A DAY  90 tablet  1  . pantoprazole (PROTONIX) 40 MG tablet Take 1 tablet (40 mg total) by mouth 2 (two) times daily before a meal. 30-60 minutes before breakfast and supper  180 tablet  3  . Probiotic Product (PROBIOTIC FORMULA PO) Take 1 capsule by mouth daily.      . vitamin B-12 (CYANOCOBALAMIN) 1000 MCG tablet Take 1,000 mcg by mouth daily.         No current facility-administered medications for this visit.    Allergies:    Allergies  Allergen Reactions  . Codeine Other (See Comments)    "flu like symptoms"  . Diphenhydramine Hcl Other (See Comments)    "nervous and upset"  . Morphine Other (See Comments)    "flu like symptoms"  . Peanut-Containing Drug Products Hives  . Zofran Other (See Comments)    HEADACHE    Social History:  The patient  reports that she quit smoking about 6 years ago. Her smoking use included Cigarettes. She smoked 0.00 packs per day. She has never used smokeless tobacco. She reports that she drinks about 0.6 ounces of alcohol per week. She reports that she does not use illicit drugs.   ROS:  Please see the history of present illness.     All other systems reviewed and negative.   PHYSICAL EXAM: VS:  BP 97/65  Pulse 94  Ht 5\' 7"  (1.702 m)  Wt 134 lb 12.8 oz (61.145 kg)  BMI 21.11 kg/m2  Filed Vitals:   07/14/13 1520 07/14/13 1521 07/14/13 1522 07/14/13 1523  BP: 92/61 80/50 95/64  97/65  Pulse: 96 96 96 94  Height:      Weight:         Well nourished, well developed, in no acute distress HEENT: normal Neck: no JVD Endocrine: No thyromegaly Cardiac:  normal S1, S2; RRR; no murmur Lungs:  clear to auscultation bilaterally, no wheezing, rhonchi or rales Abd: soft, nontender, no hepatomegaly Ext: no  edema Skin: warm and dry Neuro:  CNs 2-12 intact, no focal abnormalities noted  EKG:  NSR, HR 94, normal axis, interventricular conduction delay, PVCs, no change from prior tracing     ASSESSMENT AND PLAN:  1. Orthostatic Intolerance:  She still has a modest blood pressure drop from lying to standing. Heart rate has  not increased that much. We reviewed increasing her fluid intake and salt intake again. Have also recommended compression stockings (OTC). At this point, I do not ink she should start Florinef or Midodrine.   2. Palpitations: She has symptomatic PVCs. Treatment is somewhat limited by low blood pressure. I have offered her a prescription for diltiazem 30 mg daily when necessary. She will see how she responds to this. 3. Hyperlipidemia: Continue statin. 4. Carotid stenosis: Follow up Dopplers due 01/2014 5. Disposition: Follow up with Dr. Tenny Craw in 3 months  Signed, Tereso Newcomer, PA-C  07/14/2013 1:49 PM

## 2013-07-14 NOTE — Patient Instructions (Addendum)
AN RX FOR DILTIAZEM 30 MG HAS BEEN SENT IN TODAY FOR YOU; ONLY AS NEEDED FOR SEVERE PALPITATIONS  INCREASE WATER INTAKE  INCREASE SALT INTAKE  MAKE SURE TO GET SOME OTC COMPRESSION STOCKINGS EVERY DAY  PLEASE FOLLOW UP WITH DR. ROSS IN 3 MONTHS

## 2013-07-23 ENCOUNTER — Ambulatory Visit: Payer: Medicare Other | Admitting: Family Medicine

## 2013-07-31 ENCOUNTER — Telehealth: Payer: Self-pay | Admitting: Family Medicine

## 2013-07-31 ENCOUNTER — Encounter: Payer: Self-pay | Admitting: Family Medicine

## 2013-07-31 ENCOUNTER — Ambulatory Visit (INDEPENDENT_AMBULATORY_CARE_PROVIDER_SITE_OTHER): Payer: Medicare Other | Admitting: Family Medicine

## 2013-07-31 VITALS — BP 107/68 | HR 93 | Temp 98.4°F | Ht 67.0 in | Wt 133.1 lb

## 2013-07-31 DIAGNOSIS — K219 Gastro-esophageal reflux disease without esophagitis: Secondary | ICD-10-CM

## 2013-07-31 DIAGNOSIS — M7121 Synovial cyst of popliteal space [Baker], right knee: Secondary | ICD-10-CM

## 2013-07-31 DIAGNOSIS — F329 Major depressive disorder, single episode, unspecified: Secondary | ICD-10-CM

## 2013-07-31 DIAGNOSIS — L0291 Cutaneous abscess, unspecified: Secondary | ICD-10-CM

## 2013-07-31 DIAGNOSIS — R700 Elevated erythrocyte sedimentation rate: Secondary | ICD-10-CM

## 2013-07-31 DIAGNOSIS — Z0000 Encounter for general adult medical examination without abnormal findings: Secondary | ICD-10-CM

## 2013-07-31 DIAGNOSIS — M7120 Synovial cyst of popliteal space [Baker], unspecified knee: Secondary | ICD-10-CM

## 2013-07-31 DIAGNOSIS — Z23 Encounter for immunization: Secondary | ICD-10-CM

## 2013-07-31 DIAGNOSIS — L0390 Cellulitis, unspecified: Secondary | ICD-10-CM

## 2013-07-31 DIAGNOSIS — F341 Dysthymic disorder: Secondary | ICD-10-CM

## 2013-07-31 DIAGNOSIS — R1011 Right upper quadrant pain: Secondary | ICD-10-CM

## 2013-07-31 MED ORDER — CEPHALEXIN 500 MG PO CAPS: 500.0000 mg | ORAL_CAPSULE | Freq: Three times a day (TID) | ORAL | Status: DC

## 2013-07-31 MED ORDER — CLONAZEPAM 1 MG PO TABS: 1.0000 mg | ORAL_TABLET | Freq: Three times a day (TID) | ORAL | Status: DC | PRN

## 2013-07-31 MED ORDER — HYOSCYAMINE SULFATE 0.125 MG SL SUBL: 0.1250 mg | SUBLINGUAL_TABLET | SUBLINGUAL | Status: DC | PRN

## 2013-07-31 NOTE — Telephone Encounter (Signed)
Lab order week of 12-8-2014Labs prior to visit, lipid, renal, cbc, tsh, hepatic, sed rate.

## 2013-07-31 NOTE — Patient Instructions (Addendum)

## 2013-08-02 ENCOUNTER — Encounter: Payer: Self-pay | Admitting: Family Medicine

## 2013-08-02 DIAGNOSIS — R700 Elevated erythrocyte sedimentation rate: Secondary | ICD-10-CM | POA: Insufficient documentation

## 2013-08-02 NOTE — Assessment & Plan Note (Signed)
Lorazepam not working as well. Will try switching to Clonazepam to assess response

## 2013-08-02 NOTE — Assessment & Plan Note (Signed)
Doing better at this time 

## 2013-08-02 NOTE — Progress Notes (Signed)
Patient ID: Anne [name], female   DOB: 1941/08/30, 72 y.o.   MRN: [id number] Anne [name] [id number] 04-14-1941 08/02/2013      Progress Note-Follow Up  Subjective  Chief Complaint  Chief Complaint  Patient presents with  . Follow-up    3 month- discuss sleeping meds    HPI  Patient is a 72 year old Caucasian female who is in today for followup. Continues to struggle with knee pain but does note a good improvement since receiving a steroid injection from her rheumatologist. Baker's cyst seems almost to the patient. She denies any recent illness. No fevers or chills. No chest pain palpitations, shortness of breath. Reflux is somewhat improved current medications. Is happy with her new oncologist so far. No GI or GU complaints. Has been struggling with an itchy irritated bug bite on her right neck but does believe it is improving. It is not warm or having any discharge. Taking medications as prescribed. Is having trouble falling asleep and maintaining sleep. Lorazepam these to be helpful but is not helping as much at this time.  Past Medical History  Diagnosis Date  . Anemia   . COPD (chronic obstructive pulmonary disease)   . Hyperlipidemia   . Pneumonia 2-10    pleurisy  . Arthritis   . Depression   . GERD (gastroesophageal reflux disease) 09/29/2009    improved s/p cholecystectomy and esophagus dilatation  . History of chicken pox   . History of shingles     2 episodes  . History of measles   . Osteopenia 03/14/2011  . 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 Pure hypercholesterolemia 10/17/2010  . 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 ESOPHAGEAL STRICTURE 03/29/2009  . CAROTID ARTERY STENOSIS 01/13/2010  . ALKALINE PHOSPHATASE, ELEVATED 03/15/2009  . Anxiety and depression 04/28/2011  . Vaginitis 05/22/2011  . Baker's cyst of knee 05/22/2011  . Trauma 10/18/2011  . Radial neck fracture 10/2011    minimally displaced  . Atypical chest pain 11/30/2011  .  Folliculitis of nose 12/31/2011  . Esophageal ring   . 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 Urinary incontinence 03/19/2012  . Allergic state 06/10/2012  . Hematuria   . Dysphagia   . URI (upper respiratory infection) 09/02/2012  . Preventative health care 10/21/2012  . Dermatitis 11/23/2012  . Fall 11/23/2012  . Hx of echocardiogram     a. Echo 01/2103: mild LVH, EF 55-60%, normal wall motion, Gr 1 diast dysfn  . Knee pain, bilateral 07/23/2011  . Elevated sed rate 08/02/2013    Past Surgical History  Procedure Laterality Date  . Anterior cruciate ligament repair  08, 09, 10  . Ercp, cbd stone extraction  2010  . Cholecystectomy  2010  . Egd/ dili  2010, 2012  . Breast surgery  2001    lumpectomy (01)  . Colonoscopy  09/05/10    [facility]  . Esophagogastroduodenoscopy  01/08/2012    Procedure: ESOPHAGOGASTRODUODENOSCOPY (EGD);  Surgeon: [name], MD;  Location: [facility];  Service: Endoscopy;  Laterality: N/A;  . Savory dilation  01/08/2012    Procedure: SAVORY DILATION;  Surgeon: [name], MD;  Location: [facility];  Service: Endoscopy;  Laterality: N/A;  need xray  . Mastectomy modified radical      , Mastectomy modified radical (08), breast reconstruction, CA lesions excised lateral abd wall 2010  . Breast reconstructive surgery  2008, 2009, 2010  Family History  Problem Relation Age of Onset  . Heart disease Father   . Lung cancer Father     smoker  . Cirrhosis Sister     Primary Biliary  . Breast cancer    . Stroke Maternal Grandmother   . Alcohol abuse Maternal Grandfather   . Heart disease Paternal Grandfather   . Anxiety disorder Sister   . Osteoporosis Sister   . Arthritis Sister     Rheumatoid  . Skin cancer Sister   . Osteoporosis Sister   . Cancer Sister     multiple skin cancers, over 90 excisions.  . Other Mother     tic deleauroux  . Anesthesia problems Neg Hx   . Hypotension Neg Hx   . Malignant hyperthermia  Neg Hx   . Pseudochol deficiency Neg Hx   . Colon cancer Neg Hx   . Ovarian cancer    . Pancreatic cancer      History   Social History  . Marital Status: Married    Spouse Name: N/A    Number of Children: N/A  . Years of Education: N/A   Occupational History  . Not on file.   Social History Main Topics  . Smoking status: Former Smoker    Types: Cigarettes    Quit date: 05/22/2007  . Smokeless tobacco: Never Used  . Alcohol Use: 0.6 oz/week    1 Glasses of wine per week     Comment: 6 oz daily  . Drug Use: No  . Sexual Activity: Yes    Partners: Male   Other Topics Concern  . Not on file   Social History Narrative  . No narrative on file    Current Outpatient Prescriptions on File Prior to Visit  Medication Sig Dispense Refill  . amitriptyline (ELAVIL) 150 MG tablet take 1 tablet by mouth at bedtime  30 tablet  2  . anastrozole (ARIMIDEX) 1 MG tablet Take 1 tablet (1 mg total) by mouth daily.  90 tablet  2  . aspirin 81 MG tablet Take 81 mg by mouth daily.        [name] atorvastatin (LIPITOR) 40 MG tablet Take 1 tablet (40 mg total) by mouth daily.  90 tablet  3  . Cholecalciferol (VITAMIN D3) 1000 UNITS CAPS Take 2 tablets by mouth daily.        [name] ibuprofen (ADVIL,MOTRIN) 800 MG tablet Take 800 mg by mouth as needed.      . pantoprazole (PROTONIX) 40 MG tablet Take 1 tablet (40 mg total) by mouth 2 (two) times daily before a meal. 30-60 minutes before breakfast and supper  180 tablet  3  . Probiotic Product (PROBIOTIC FORMULA PO) Take 1 capsule by mouth daily.      . vitamin B-12 (CYANOCOBALAMIN) 1000 MCG tablet Take 1,000 mcg by mouth daily.        [name] diltiazem (CARDIZEM) 30 MG tablet Take 1 tablet (30 mg total) by mouth daily as needed. Only take for severe palpitations  15 tablet  2   No current facility-administered medications on file prior to visit.    Allergies  Allergen Reactions  . Codeine Other (See Comments)    "flu like symptoms"  . Diphenhydramine Hcl  Other (See Comments)    "nervous and upset"  . Morphine Other (See Comments)    "flu like symptoms"  . Peanut-Containing Drug Products Hives  . Zofran Other (See Comments)    HEADACHE    Review of Systems  Review of Systems  Constitutional: Negative for fever and malaise/fatigue.  HENT: Negative for congestion.   Eyes: Negative for discharge.  Respiratory: Negative for shortness of breath.   Cardiovascular: Negative for chest pain, palpitations and leg swelling.  Gastrointestinal: Negative for nausea, abdominal pain and diarrhea.  Genitourinary: Negative for dysuria.  Musculoskeletal: Positive for joint pain. Negative for falls.  Skin: Negative for rash.  Neurological: Negative for loss of consciousness and headaches.  Endo/Heme/Allergies: Negative for polydipsia.  Psychiatric/Behavioral: Negative for depression and suicidal ideas. The patient is nervous/anxious and has insomnia.     Objective  BP 107/68  Pulse 93  Temp(Src) 98.4 F (36.9 C) (Oral)  Ht 5\' 7"  (1.702 m)  Wt 133 lb 1.3 oz (60.365 kg)  BMI 20.84 kg/m2  SpO2 97%  Physical Exam  Physical Exam  Constitutional: She is oriented to person, place, and time and well-developed, well-nourished, and in no distress. No distress.  HENT:  Head: Normocephalic and atraumatic.  Eyes: Conjunctivae are normal.  Neck: Neck supple. No thyromegaly present.  Cardiovascular: Normal rate, regular rhythm and normal heart sounds.   No murmur heard. Pulmonary/Chest: Effort normal and breath sounds normal. She has no wheezes.  Abdominal: She exhibits no distension and no mass.  Musculoskeletal: She exhibits no edema.  Lymphadenopathy:    She has no cervical adenopathy.  Neurological: She is alert and oriented to person, place, and time.  Skin: Skin is warm and dry. Rash noted. She is not diaphoretic. There is erythema.  Papular lesion on right side of neck with small scab in center  Psychiatric: Memory, affect and judgment  normal.    Lab Results  Component Value Date   TSH 0.61 01/13/2013   Lab Results  Component Value Date   WBC 8.4 06/26/2013   HGB 11.1* 06/26/2013   HCT 32.7* 06/26/2013   MCV 91.8 06/26/2013   PLT 272 06/26/2013   Lab Results  Component Value Date   CREATININE 0.8 06/26/2013   BUN 13.9 06/26/2013   NA 139 06/26/2013   K 3.8 06/26/2013   CL 100 05/20/2013   CO2 25 06/26/2013   Lab Results  Component Value Date   ALT 43 06/26/2013   AST 33 06/26/2013   ALKPHOS 133 06/26/2013   BILITOT 0.47 06/26/2013   Lab Results  Component Value Date   CHOL 201* 01/13/2013   Lab Results  Component Value Date   HDL 65.70 01/13/2013   Lab Results  Component Value Date   LDLCALC 89 10/21/2012   Lab Results  Component Value Date   TRIG 158.0* 01/13/2013   Lab Results  Component Value Date   CHOLHDL 3 01/13/2013     Assessment & Plan  Baker's cyst of knee Recent steroid injection in right knee by rheumatology has been helpful  Elevated sed rate Stable has been seen by Rheumatology and reasssured that she is not struggling with a secondary process we need to address. Encouraged daily aspirin, healthy diet, regular exercise and krill oil caps daily  Anxiety and depression Lorazepam not working as well. Will try switching to Clonazepam to assess response  GERD Doing better at this time.

## 2013-08-02 NOTE — Assessment & Plan Note (Signed)
Anne [name] has been seen by Rheumatology and reasssured that she is not struggling with a secondary process we need to address. Encouraged daily aspirin, healthy diet, regular exercise and krill oil caps daily

## 2013-08-02 NOTE — Assessment & Plan Note (Signed)
Recent steroid injection in right knee by rheumatology has been helpful

## 2013-08-04 ENCOUNTER — Encounter: Payer: Self-pay | Admitting: Adult Health

## 2013-08-04 ENCOUNTER — Ambulatory Visit (HOSPITAL_BASED_OUTPATIENT_CLINIC_OR_DEPARTMENT_OTHER): Payer: Medicare Other | Admitting: Adult Health

## 2013-08-04 ENCOUNTER — Other Ambulatory Visit: Payer: Self-pay | Admitting: Lab

## 2013-08-04 ENCOUNTER — Ambulatory Visit: Payer: Self-pay | Admitting: Oncology

## 2013-08-04 VITALS — BP 131/82 | HR 87 | Temp 97.7°F | Resp 20 | Ht 67.0 in | Wt 135.5 lb

## 2013-08-04 DIAGNOSIS — E559 Vitamin D deficiency, unspecified: Secondary | ICD-10-CM

## 2013-08-04 DIAGNOSIS — C50919 Malignant neoplasm of unspecified site of unspecified female breast: Secondary | ICD-10-CM

## 2013-08-04 DIAGNOSIS — Z853 Personal history of malignant neoplasm of breast: Secondary | ICD-10-CM

## 2013-08-04 NOTE — Patient Instructions (Addendum)
Doing well.  No sign of recurrence.  Continue daily arimidex.  We will see you back in 6 months.  Please call us if you have any questions or concerns.    

## 2013-08-04 NOTE — Progress Notes (Addendum)
OFFICE PROGRESS NOTE  CCDanise Edge, MD [address] 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 She was treated systemically with CMF followed by 5 years of tamoxifen.  #2 second diagnosis was in January 2008 patient did not undergo surgery until July 2008. She also did not receive any adjuvant systemic treatment for her decision. Her course was complicated with difficulty healing after her right breast reconstruction. She had skin recurrence lateral chest wall excised in September 2010 and then treated with Xeloda from October 2010 through February 2011. Patient has been on Arimidex since July 2011.  CURRENT THERAPY: Arimidex 1 mg daily  INTERVAL HISTORY: Anne [name] 72 y.o. female returns for followup visit for her breast cancer.  She contineus to take Arimidex every day and is toelrating it well.  She has occasional intermittent joint pain, but it is tolerable.  She denies hot flashes and dryness.  She denies fevers, chills, nausea, vomiting, night sweats, constipation, diarrhea, dysuria, hematuria, unintentional weight loss, or any further concerns.  She requested results of CT scans.    MEDICAL HISTORY: Past Medical History  Diagnosis Date  . Anemia   . COPD (chronic obstructive pulmonary disease)   . Hyperlipidemia   . Pneumonia 2-10    pleurisy  . Arthritis   . Depression   . GERD (gastroesophageal reflux disease) 09/29/2009    improved s/p cholecystectomy and esophagus dilatation  . History of chicken pox   . History of shingles     2 episodes  . History of measles   . Osteopenia 03/14/2011  . 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 Pure hypercholesterolemia 10/17/2010  . 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 ESOPHAGEAL STRICTURE  03/29/2009  . CAROTID ARTERY STENOSIS 01/13/2010  . ALKALINE PHOSPHATASE, ELEVATED 03/15/2009  . Anxiety and depression 04/28/2011  . Vaginitis 05/22/2011  . Baker's cyst of knee 05/22/2011  . Trauma 10/18/2011  . Radial neck fracture 10/2011    minimally displaced  . Atypical chest pain 11/30/2011  . Folliculitis of nose 12/31/2011  . Esophageal ring   . 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 Urinary incontinence 03/19/2012  . Allergic state 06/10/2012  . Hematuria   . Dysphagia   . URI (upper respiratory infection) 09/02/2012  . Preventative health care 10/21/2012  . Dermatitis 11/23/2012  . Fall 11/23/2012  . Hx of echocardiogram     a. Echo 01/2103: mild LVH, EF 55-60%, normal wall motion, Gr 1 diast dysfn  . Knee pain, bilateral 07/23/2011  . Elevated sed rate 08/02/2013    ALLERGIES:  is allergic to codeine; diphenhydramine hcl; morphine; peanut-containing drug products; and zofran.  MEDICATIONS:  Current Outpatient Prescriptions  Medication Sig Dispense Refill  . amitriptyline (ELAVIL) 150 MG tablet take 1 tablet by mouth at bedtime  30 tablet  2  . anastrozole (ARIMIDEX) 1 MG tablet Take 1 tablet (1 mg total) by mouth daily.  90 tablet  2  . aspirin 81 MG tablet Take 81 mg by mouth daily.        [name] atorvastatin (LIPITOR) 40 MG tablet Take 1 tablet (40 mg total) by mouth daily.  90 tablet  3  . Cholecalciferol (VITAMIN D3) 1000 UNITS CAPS Take 2 tablets  by mouth daily.        [name] ibuprofen (ADVIL,MOTRIN) 800 MG tablet Take 800 mg by mouth as needed.      . pantoprazole (PROTONIX) 40 MG tablet Take 1 tablet (40 mg total) by mouth 2 (two) times daily before a meal. 30-60 minutes before breakfast and supper  180 tablet  3  . Probiotic Product (PROBIOTIC FORMULA PO) Take 1 capsule by mouth daily.      . vitamin B-12 (CYANOCOBALAMIN) 1000 MCG tablet Take 1,000 mcg by mouth daily.        . clonazePAM (KLONOPIN) 1 MG tablet Take 1 tablet (1 mg total) by mouth 3 (three) times  daily as needed for anxiety (sleep).  90 tablet  1  . diltiazem (CARDIZEM) 30 MG tablet Take 1 tablet (30 mg total) by mouth daily as needed. Only take for severe palpitations  15 tablet  2  . hyoscyamine (LEVSIN SL) 0.125 MG SL tablet Place 1 tablet (0.125 mg total) under the tongue every 4 (four) hours as needed for cramping or diarrhea or loose stools.  30 tablet  1   No current facility-administered medications for this visit.    SURGICAL HISTORY:  Past Surgical History  Procedure Laterality Date  . Anterior cruciate ligament repair  08, 09, 10  . Ercp, cbd stone extraction  2010  . Cholecystectomy  2010  . Egd/ dili  2010, 2012  . Breast surgery  2001    lumpectomy (01)  . Colonoscopy  09/05/10    [facility]  . Esophagogastroduodenoscopy  01/08/2012    Procedure: ESOPHAGOGASTRODUODENOSCOPY (EGD);  Surgeon: [name], MD;  Location: [facility];  Service: Endoscopy;  Laterality: N/A;  . Savory dilation  01/08/2012    Procedure: SAVORY DILATION;  Surgeon: [name], MD;  Location: [facility];  Service: Endoscopy;  Laterality: N/A;  need xray  . Mastectomy modified radical      , Mastectomy modified radical (08), breast reconstruction, CA lesions excised lateral abd wall 2010  . Breast reconstructive surgery  2008, 2009, 2010    REVIEW OF SYSTEMS:  A 10 point review of systems was conducted and is otherwise negative except for what is noted above.    Health Maintenance Mammogram:  01/01/2013 Colonoscopy: 09/05/2010 Bone Density Scan:  09/15/2012 Pap Smear: does not have anymore, last about 3 years ago Eye Exam: 5 years ago Vitamin D Level: due Lipid Panel: 01/13/13  PHYSICAL EXAMINATION: Blood pressure 131/82, pulse 87, temperature 97.7 F (36.5 C), temperature source Oral, resp. rate 20, height 5\' 7"  (1.702 m), weight 135 lb 8 oz (61.462 kg). Body mass index is 21.22 kg/(m^2). General: Patient is a well appearing female in no acute distress HEENT: PERRLA, sclerae  anicteric no conjunctival pallor, MMM Neck: supple, no palpable adenopathy Lungs: clear to auscultation bilaterally, no wheezes, rhonchi, or rales Cardiovascular: regular rate rhythm, S1, S2, no murmurs, rubs or gallops Abdomen: Soft, non-tender, non-distended, normoactive bowel sounds, no HSM Extremities: warm and well perfused, no clubbing, cyanosis, or edema Skin: No rashes or lesions Neuro: Non-focal Breasts: reconstructed right breast and right lateral chest wall into axilla without findings of concern for local recurrence, left breast no nodules or masses.   ECOG PERFORMANCE STATUS: 1 - Symptomatic but completely ambulatory      LABORATORY DATA: Lab Results  Component Value Date   WBC 8.4 06/26/2013   HGB 11.1* 06/26/2013   HCT 32.7* 06/26/2013   MCV 91.8 06/26/2013   PLT 272 06/26/2013  Chemistry      Component Value Date/Time   NA 139 06/26/2013 1321   NA 140 05/20/2013 1526   K 3.8 06/26/2013 1321   K 3.7 05/20/2013 1526   CL 100 05/20/2013 1526   CL 102 11/28/2012 1043   CO2 25 06/26/2013 1321   CO2 28 05/20/2013 1526   BUN 13.9 06/26/2013 1321   BUN 13 05/20/2013 1526   CREATININE 0.8 06/26/2013 1321   CREATININE 0.70 05/20/2013 1526   CREATININE 0.71 09/12/2011 1559      Component Value Date/Time   CALCIUM 9.8 06/26/2013 1321   CALCIUM 10.0 05/20/2013 1526   ALKPHOS 133 06/26/2013 1321   ALKPHOS 105 01/13/2013 1210   AST 33 06/26/2013 1321   AST 18 01/13/2013 1210   ALT 43 06/26/2013 1321   ALT 16 01/13/2013 1210   BILITOT 0.47 06/26/2013 1321   BILITOT 0.6 01/13/2013 1210       RADIOGRAPHIC STUDIES:  Dg Sacrum/coccyx  05/29/2013   *RADIOLOGY REPORT*  Clinical Data: Fall.  Coccydynia.  SACRUM AND COCCYX - 2+ VIEW  Comparison:  None.  Findings: There is no coccygeal dislocation.  There is a subtle lucency across the distal sacrum (arrow) seen on the lateral view, which could represent a nondisplaced fracture.  Correlate clinically.  IMPRESSION: Possible nondisplaced distal sacral  fracture.   Original Report Authenticated By: Davonna Belling, M.D.    ASSESSMENT: 72 year old female with  #1 original diagnosis of breast cancer in the left breast in 2001 she received lumpectomy followed by adjuvant chemotherapy consisting of CMF.  #2 and 2008 patient had recurrent disease she declined surgery until July 2008. She then went on to have radiation with Xeloda. She has been on Arimidex 1 mg daily. So far tolerating it well.   PLAN:   #1  Doing well.  No sign of recurrence.  Patient will continue arimidex daily.  Reviewed CT chest, abdomen pelvis results from 06/2013 with patient, and that there was no evidence of recurrence.  She was pleased.    #2 Patient will return in 6 months for labs and follow up.    All questions were answered. The patient knows to call the clinic with any problems, questions or concerns. We can certainly see the patient much sooner if necessary.  I spent 25 minutes counseling the patient face to face. The total time spent in the appointment was 30 minutes.  Cherie Ouch Lyn Hollingshead, NP Medical Oncology [facility] Phone: [phone] 08/04/2013, 1:50 PM  ATTENDING'S ATTESTATION:  I personally reviewed patient's chart, examined patient myself, formulated the treatment plan as followed.   Overall patient is doing well. He is within establishing care with me. Original breast cancer was in 2001 she underwent lumpectomy followed by mouth. She then had a recurrence in declined surgery until July 2008. He went on to have radiation and Xeloda and Arimidex 1 mg daily tolerating it well without any problems. Patient had CT scans performed no evidence of disease. We will continue to see her every 6 months duration of and once a year thereafter.Drue Second, MD Medical/Oncology [facility] [phone] (beeper) [phone] (Office)  08/12/2013, 1:12 PM

## 2013-08-06 ENCOUNTER — Telehealth: Payer: Self-pay | Admitting: Oncology

## 2013-08-06 ENCOUNTER — Telehealth: Payer: Self-pay

## 2013-08-06 MED ORDER — AMITRIPTYLINE HCL 150 MG PO TABS: 150.0000 mg | ORAL_TABLET | Freq: Every day | ORAL | Status: DC

## 2013-08-06 NOTE — Telephone Encounter (Signed)
Patients spouse called stating that the sublingual medication is not covered? They would like something else sent to the pharmacy? Please advise?         Patients spouse called stating the manufacturer brand of Amitriptyline needs to be Sandoz. This is done

## 2013-08-06 NOTE — Telephone Encounter (Signed)
, °

## 2013-08-06 NOTE — Telephone Encounter (Signed)
Can you please call the pharmacy and see why her Hyoscyamine is not covered and if a different version of it is covered so we can adjust it. I have never know this to not be covered

## 2013-08-07 NOTE — Telephone Encounter (Signed)
I tried to call patient the number is busy

## 2013-08-07 NOTE — Telephone Encounter (Signed)
I called and spoke with Anne [name] at [facility] and he states that it says that this is not covered by part D law. Anne [name] states that he doesn't know what would be covered

## 2013-08-07 NOTE — Telephone Encounter (Signed)
I have just never heard this before. There is nothing similar that does not contain this ingredient. Patient could try just to buy a few and see if they are even helpful to her. Check with pharmacy downstairs and see if they agree that Hyoscyamine is not covered by medicare part D. I am curious.

## 2013-08-10 ENCOUNTER — Telehealth: Payer: Self-pay

## 2013-08-10 MED ORDER — PANTOPRAZOLE SODIUM 40 MG PO TBEC: 40.0000 mg | DELAYED_RELEASE_TABLET | Freq: Two times a day (BID) | ORAL | Status: DC

## 2013-08-10 NOTE — Telephone Encounter (Signed)
So she picked up the hyoscyamine and it is not working? Give her some Tramadol 50 mg to take tid for severe pain #30 and warn her it rarely causes sedation then if she is willing I will order an abdominal Ultrasound to look for cause of pain, if no cause found and pain still present will need to come back in for further consideration and testing

## 2013-08-10 NOTE — Telephone Encounter (Signed)
Patients husband states that pt picked up the medication and it does not help. Pt will eat or drink something dairy then take one of the pills and will still have to resort to Tylenol and the heating pad.  Please advise?

## 2013-08-11 ENCOUNTER — Other Ambulatory Visit: Payer: Self-pay | Admitting: Family Medicine

## 2013-08-11 DIAGNOSIS — R1011 Right upper quadrant pain: Secondary | ICD-10-CM

## 2013-08-11 MED ORDER — TRAMADOL HCL 50 MG PO TABS: 50.0000 mg | ORAL_TABLET | Freq: Three times a day (TID) | ORAL | Status: DC | PRN

## 2013-08-11 NOTE — Telephone Encounter (Signed)
RX sent, patient informed and is willing to do the abdominal ultrasound.  Please advise?

## 2013-08-11 NOTE — Telephone Encounter (Signed)
done

## 2013-08-11 NOTE — Telephone Encounter (Signed)
RX sent and message left on patients answering machine to return my call

## 2013-08-13 ENCOUNTER — Telehealth: Payer: Self-pay

## 2013-08-13 ENCOUNTER — Ambulatory Visit (HOSPITAL_BASED_OUTPATIENT_CLINIC_OR_DEPARTMENT_OTHER)
Admission: RE | Admit: 2013-08-13 | Discharge: 2013-08-13 | Disposition: A | Discharge status: Home / Self Care | Payer: Medicare Other | Source: Ambulatory Visit | Attending: Family Medicine | Admitting: Family Medicine

## 2013-08-13 DIAGNOSIS — Z853 Personal history of malignant neoplasm of breast: Secondary | ICD-10-CM | POA: Insufficient documentation

## 2013-08-13 DIAGNOSIS — Z9089 Acquired absence of other organs: Secondary | ICD-10-CM | POA: Insufficient documentation

## 2013-08-13 DIAGNOSIS — R1011 Right upper quadrant pain: Secondary | ICD-10-CM | POA: Insufficient documentation

## 2013-08-13 NOTE — Telephone Encounter (Signed)
Left message for patient to call, sent refill for [facility]oprazole to [facility] , phone # [phone] on 08/10/13, pharmacy said she has not picked it up yet.  Today got request from [employer] for the same rx.  Hopefully patient will call tomorrow and let me know which pharmacy she prefers.  Also need to set her up a follow up appointment with Dr. Leone Payor, overdue.

## 2013-08-13 NOTE — Progress Notes (Signed)
Quick Note:  Patient Informed and voiced understanding ______ 

## 2013-08-17 ENCOUNTER — Other Ambulatory Visit: Payer: Self-pay

## 2013-08-17 MED ORDER — ATORVASTATIN CALCIUM 40 MG PO TABS: 40.0000 mg | ORAL_TABLET | Freq: Every day | ORAL | Status: DC

## 2013-08-17 MED ORDER — PANTOPRAZOLE SODIUM 40 MG PO TBEC: 40.0000 mg | DELAYED_RELEASE_TABLET | Freq: Two times a day (BID) | ORAL | Status: DC

## 2013-08-17 NOTE — Telephone Encounter (Signed)
Patient has made appointment so refill sent in to [facility].  Called and cancelled rx [facility], sent there in error.

## 2013-08-27 ENCOUNTER — Encounter: Payer: Self-pay | Admitting: Family Medicine

## 2013-08-27 ENCOUNTER — Ambulatory Visit (INDEPENDENT_AMBULATORY_CARE_PROVIDER_SITE_OTHER): Payer: Medicare Other | Admitting: Family Medicine

## 2013-08-27 VITALS — BP 122/72 | HR 82 | Temp 98.1°F | Ht 67.0 in | Wt 132.1 lb

## 2013-08-27 DIAGNOSIS — L309 Dermatitis, unspecified: Secondary | ICD-10-CM

## 2013-08-27 DIAGNOSIS — L259 Unspecified contact dermatitis, unspecified cause: Secondary | ICD-10-CM

## 2013-08-27 DIAGNOSIS — F341 Dysthymic disorder: Secondary | ICD-10-CM

## 2013-08-27 DIAGNOSIS — F329 Major depressive disorder, single episode, unspecified: Secondary | ICD-10-CM

## 2013-08-27 DIAGNOSIS — F1010 Alcohol abuse, uncomplicated: Secondary | ICD-10-CM

## 2013-08-27 DIAGNOSIS — K200 Eosinophilic esophagitis: Secondary | ICD-10-CM

## 2013-08-27 MED ORDER — CLOBETASOL PROPIONATE 0.05 % EX CREA: TOPICAL_CREAM | Freq: Two times a day (BID) | CUTANEOUS | Status: DC

## 2013-08-27 NOTE — Patient Instructions (Signed)
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
## 2013-08-28 ENCOUNTER — Telehealth: Payer: Self-pay

## 2013-08-28 NOTE — Telephone Encounter (Signed)
Pt left vc mail stating that she had a recent OV with you and that day she had a black eye. She states she felt as if her husband or children notified you prior to her coming in that she had a black eye and if so she is not very happy about it at all. She is requesting a return phone call regarding this matter.

## 2013-08-29 NOTE — Assessment & Plan Note (Signed)
Has a small macular roughly 3 mm on her right thigh. It's been present about 3 months and appears to be a scarred over lesions possibly from an old right. Is encouraged to use witch hazel and Sarna and to contact dermatology if continues to be concerned about this lesion

## 2013-08-29 NOTE — Assessment & Plan Note (Signed)
Is doing better

## 2013-08-29 NOTE — Assessment & Plan Note (Signed)
Patient continues to struggle with anxiety and depression and is fighting notably with her husband in the office today. Twice a day we'll about him making outdoors in her drinking too much. She is encouraged to consider separating from her husband due to the vitreolic nature of their interactions but does not appear willing to do this.

## 2013-08-29 NOTE — Assessment & Plan Note (Addendum)
Unfortunately continues to drink routinely. Has been encouraged many times to stop. The black eye from getting tripped up in her bed sheets when trying to get after she had been drinking.

## 2013-08-31 NOTE — Telephone Encounter (Signed)
Called patient cell but got a message that said voice mail box not set up.

## 2013-08-31 NOTE — Telephone Encounter (Signed)
I also tried to call patients vm. It states you have reached a vm that is not set up yet

## 2013-09-14 ENCOUNTER — Ambulatory Visit: Payer: Medicare Other | Admitting: Internal Medicine

## 2013-09-14 NOTE — Telephone Encounter (Signed)
Pt called stating that ever since Dr Abner Greenspan changed her medication she is having strange behavior? Pt states she can't take this medication because its making her "crazy". pts call transferred to [facility] for appt

## 2013-09-18 ENCOUNTER — Encounter: Payer: Self-pay | Admitting: Family Medicine

## 2013-09-18 ENCOUNTER — Ambulatory Visit (INDEPENDENT_AMBULATORY_CARE_PROVIDER_SITE_OTHER): Payer: Medicare Other | Admitting: Family Medicine

## 2013-09-18 VITALS — BP 102/80 | HR 89 | Temp 98.2°F | Ht 67.0 in | Wt 133.0 lb

## 2013-09-18 DIAGNOSIS — D649 Anemia, unspecified: Secondary | ICD-10-CM

## 2013-09-18 DIAGNOSIS — Z0000 Encounter for general adult medical examination without abnormal findings: Secondary | ICD-10-CM

## 2013-09-18 DIAGNOSIS — Z8619 Personal history of other infectious and parasitic diseases: Secondary | ICD-10-CM

## 2013-09-18 DIAGNOSIS — F1010 Alcohol abuse, uncomplicated: Secondary | ICD-10-CM

## 2013-09-18 DIAGNOSIS — R000 Tachycardia, unspecified: Secondary | ICD-10-CM

## 2013-09-18 DIAGNOSIS — E785 Hyperlipidemia, unspecified: Secondary | ICD-10-CM

## 2013-09-18 DIAGNOSIS — F411 Generalized anxiety disorder: Secondary | ICD-10-CM

## 2013-09-18 LAB — CBC
HCT: 36.4 % (ref 36.0–46.0)
Hemoglobin: 12.3 g/dL (ref 12.0–15.0)
MCHC: 33.8 g/dL (ref 30.0–36.0)
MCV: 90.1 fL (ref 78.0–100.0)
Platelets: 268 10*3/uL (ref 150–400)
RBC: 4.04 MIL/uL (ref 3.87–5.11)
WBC: 5.8 10*3/uL (ref 4.0–10.5)

## 2013-09-18 MED ORDER — LORAZEPAM 1 MG PO TABS: 1.0000 mg | ORAL_TABLET | Freq: Three times a day (TID) | ORAL | Status: DC | PRN

## 2013-09-18 NOTE — Patient Instructions (Signed)
Alcohol Use Disorder  Alcohol use disorder is a mental disorder. It is not a one-time incident of heavy drinking. Alcohol use disorder is the excessive and uncontrollable use of alcohol over time that leads to problems with functioning in one or more areas of daily living. People with this disorder risk harming themselves and others when they drink to excess. Alcohol use disorder also can cause other mental disorders, such as mood and anxiety disorders, and serious physical problems. People with alcohol use disorder often misuse other drugs.   Alcohol use disorder is common and widespread. Some people with this disorder drink alcohol to cope with or escape from negative life events. Others drink to relieve chronic pain or symptoms of mental illness. People with a family history of alcohol use disorder are at higher risk of losing control and using alcohol to excess.   SYMPTOMS   Signs and symptoms of alcohol use disorder may include the following:   · Consumption of alcohol in larger amounts or over a longer period of time than intended.  · Multiple unsuccessful attempts to cut down or control alcohol use.    · A great deal of time spent obtaining alcohol, using alcohol, or recovering from the effects of alcohol (hangover).  · A strong desire or urge to use alcohol (cravings).    · Continued use of alcohol despite problems at work, school, or home because of alcohol use.    · Continued use of alcohol despite problems in relationships because of alcohol use.  · Continued use of alcohol in situations when it is physically hazardous, such as driving a car.  · Continued use of alcohol despite awareness of a physical or psychological problem that is likely related to alcohol use. Physical problems related to alcohol use can involve the brain, heart, liver, stomach, and intestines. Psychological problems related to alcohol use include intoxication, depression, anxiety, psychosis, delirium, and dementia.    · The need for  increased amounts of alcohol to achieve the same desired effect, or a decreased effect from the consumption of the same amount of alcohol (tolerance).  · Withdrawal symptoms upon reducing or stopping alcohol use, or alcohol use to reduce or avoid withdrawal symptoms. Withdrawal symptoms include:  · Racing heart.  · Hand tremor.  · Difficulty sleeping.  · Nausea.  · Vomiting.  · Hallucinations.  · Restlessness.  · Seizures.  DIAGNOSIS  Alcohol use disorder is diagnosed through an assessment by your caregiver. Your caregiver may start by asking three or four questions to screen for excessive or problematic alcohol use. To confirm a diagnosis of alcohol use disorder, at least two symptoms (see SYMPTOMS) must be present within a 12-month period. The severity of alcohol use disorder depends on the number of symptoms:  · Mild two or three.  · Moderate four or five.  · Severe six or more.  Your caregiver may perform a physical exam or use results from lab tests to see if you have physical problems resulting from alcohol use. Your caregiver may refer you to a mental health professional for evaluation.  TREATMENT   Some people with alcohol use disorder are able to reduce their alcohol use to low-risk levels. Some people with alcohol use disorder need to quit drinking alcohol. When necessary, mental health professionals with specialized training in substance use treatment can help. Your caregiver can help you decide how severe your alcohol use disorder is and what type of treatment you need. The following forms of treatment are available:   · Detoxification. Detoxification involves   the use of prescription medication to prevent alcohol withdrawal symptoms in the first week after quitting. This is important for people with a history of symptoms of withdrawal and for heavy drinkers who are likely to have withdrawal symptoms. Alcohol withdrawal can be dangerous and, in severe cases, cause death. Detoxification is usually provided  in a hospital or in-patient substance use treatment facility.  · Counseling or talk therapy. Talk therapy is provided by substance use treatment counselors. It addresses the reasons people use alcohol and ways to keep them from drinking again. The goals of talk therapy are to help people with alcohol use disorder find healthy activities and ways to cope with life stress, to identify and avoid triggers for alcohol use, and to handle cravings, which can cause relapse.  · Medication. Different medications can help treat alcohol use disorder through the following actions:  · Decrease alcohol cravings.  · Decrease the positive reward response felt from alcohol use.  · Produce an uncomfortable physical reaction when alcohol is used (aversion therapy).  · Support groups. Support groups are run by people who have quit drinking. They provide emotional support, advice, and guidance.  These forms of treatment are often combined. Some people with alcohol use disorder benefit from intensive combination treatment provided by specialized substance use treatment centers. Both inpatient and outpatient treatment programs are available.  Document Released: 12/06/2004 Document Revised: 07/01/2013 Document Reviewed: 02/05/2013  [employer]® Patient Information ©2014 [employer].

## 2013-09-20 ENCOUNTER — Encounter: Payer: Self-pay | Admitting: Family Medicine

## 2013-09-20 NOTE — Assessment & Plan Note (Signed)
Declines Zostavax today

## 2013-09-20 NOTE — Assessment & Plan Note (Signed)
Declines Pneumonia vaccine today

## 2013-09-20 NOTE — Assessment & Plan Note (Signed)
Well controlled today.

## 2013-09-20 NOTE — Progress Notes (Signed)
Patient ID: Anne [name], female   DOB: [birth year], 72 y.o.   MRN: [id number] Anne [name] [id number] March 10, 1941 09/20/2013      Progress Note-Follow Up  Subjective  Chief Complaint  Chief Complaint  Patient presents with  . medication    medication making pt crazy    HPI  Patient is a 72 yo female in today for follow up. She has cut down on her drinking but still drinks some wine, did not do well on Klonopin. caused confusion and bad dreams. Did better on Lorazepam. No other recent illness, cp, palp, sob, gi or gu c/o. Taking meds as prescribed.   Past Medical History  Diagnosis Date  . Anemia   . COPD (chronic obstructive pulmonary disease)   . Hyperlipidemia   . Pneumonia 2-10    pleurisy  . Arthritis   . Depression   . GERD (gastroesophageal reflux disease) 09/29/2009    improved s/p cholecystectomy and esophagus dilatation  . History of chicken pox   . History of shingles     2 episodes  . History of measles   . Osteopenia 03/14/2011  . 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 Pure hypercholesterolemia 10/17/2010  . 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 ESOPHAGEAL STRICTURE 03/29/2009  . CAROTID ARTERY STENOSIS 01/13/2010  . ALKALINE PHOSPHATASE, ELEVATED 03/15/2009  . Anxiety and depression 04/28/2011  . Vaginitis 05/22/2011  . Baker's cyst of knee 05/22/2011  . Trauma 10/18/2011  . Radial neck fracture 10/2011    minimally displaced  . Atypical chest pain 11/30/2011  . Folliculitis of nose 12/31/2011  . Esophageal ring   . 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 Urinary incontinence 03/19/2012  . Allergic state 06/10/2012  . Hematuria   . Dysphagia   . URI (upper respiratory infection) 09/02/2012  . Preventative health care 10/21/2012  . Dermatitis 11/23/2012  . Fall 11/23/2012  . Hx of echocardiogram     a. Echo 01/2103: mild LVH, EF 55-60%, normal wall motion, Gr 1 diast dysfn  . Knee pain, bilateral 07/23/2011  . Elevated  sed rate 08/02/2013    Past Surgical History  Procedure Laterality Date  . Anterior cruciate ligament repair  08, 09, 10  . Ercp, cbd stone extraction  2010  . Cholecystectomy  2010  . Egd/ dili  2010, 2012  . Breast surgery  2001    lumpectomy (01)  . Colonoscopy  09/05/10    cecal avm's  . Esophagogastroduodenoscopy  01/08/2012    Procedure: ESOPHAGOGASTRODUODENOSCOPY (EGD);  Surgeon: [name], MD;  Location: [facility];  Service: Endoscopy;  Laterality: N/A;  . Savory dilation  01/08/2012    Procedure: SAVORY DILATION;  Surgeon: [name], MD;  Location: [facility];  Service: Endoscopy;  Laterality: N/A;  need xray  . Mastectomy modified radical      , Mastectomy modified radical (08), breast reconstruction, CA lesions excised lateral abd wall 2010  . Breast reconstructive surgery  2008, 2009, 2010    Family History  Problem Relation Age of Onset  . Heart disease Father   . Lung cancer Father     smoker  . Cirrhosis Sister     Primary Biliary  . Breast cancer    . Stroke Maternal Grandmother   . Alcohol abuse Maternal Grandfather   . Heart disease Paternal Grandfather   . Anxiety disorder Sister   . Osteoporosis Sister   .  Arthritis Sister     Rheumatoid  . Skin cancer Sister   . Osteoporosis Sister   . Cancer Sister     multiple skin cancers, over 90 excisions.  . Other Mother     tic deleauroux  . Anesthesia problems Neg Hx   . Hypotension Neg Hx   . Malignant hyperthermia Neg Hx   . Pseudochol deficiency Neg Hx   . Colon cancer Neg Hx   . Ovarian cancer    . Pancreatic cancer      History   Social History  . Marital Status: Married    Spouse Name: N/A    Number of Children: N/A  . Years of Education: N/A   Occupational History  . Not on file.   Social History Main Topics  . Smoking status: Former Smoker    Types: Cigarettes    Quit date: 05/22/2007  . Smokeless tobacco: Never Used  . Alcohol Use: 0.6 oz/week    1 Glasses of wine  per week     Comment: 6 oz daily  . Drug Use: No  . Sexual Activity: Yes    Partners: Male   Other Topics Concern  . Not on file   Social History Narrative  . No narrative on file    Current Outpatient Prescriptions on File Prior to Visit  Medication Sig Dispense Refill  . amitriptyline (ELAVIL) 150 MG tablet Take 1 tablet (150 mg total) by mouth at bedtime. Please dispense Sandoz manufacturer  90 tablet  3  . anastrozole (ARIMIDEX) 1 MG tablet Take 1 tablet (1 mg total) by mouth daily.  90 tablet  2  . aspirin 81 MG tablet Take 81 mg by mouth daily.        [name] atorvastatin (LIPITOR) 40 MG tablet Take 1 tablet (40 mg total) by mouth daily.  30 tablet  0  . Cholecalciferol (VITAMIN D3) 1000 UNITS CAPS Take 2 tablets by mouth daily.        . clobetasol cream (TEMOVATE) 0.05 % Apply topically 2 (two) times daily.  60 g  1  . diltiazem (CARDIZEM) 30 MG tablet Take 1 tablet (30 mg total) by mouth daily as needed. Only take for severe palpitations  15 tablet  2  . hyoscyamine (LEVSIN SL) 0.125 MG SL tablet Place 1 tablet (0.125 mg total) under the tongue every 4 (four) hours as needed for cramping or diarrhea or loose stools.  30 tablet  1  . ibuprofen (ADVIL,MOTRIN) 800 MG tablet Take 800 mg by mouth as needed.      . pantoprazole (PROTONIX) 40 MG tablet Take 1 tablet (40 mg total) by mouth 2 (two) times daily before a meal. 30-60 minutes before breakfast and supper  180 tablet  0  . Probiotic Product (PROBIOTIC FORMULA PO) Take 1 capsule by mouth daily.      . vitamin B-12 (CYANOCOBALAMIN) 1000 MCG tablet Take 1,000 mcg by mouth daily.         No current facility-administered medications on file prior to visit.    Allergies  Allergen Reactions  . Codeine Other (See Comments)    "flu like symptoms"  . Diphenhydramine Hcl Other (See Comments)    "nervous and upset"  . Morphine Other (See Comments)    "flu like symptoms"  . Peanut-Containing Drug Products Hives  . Zofran Other (See  Comments)    HEADACHE    Review of Systems  Review of Systems  Constitutional: Negative for fever and malaise/fatigue.  HENT: Negative for congestion.   Eyes: Negative for discharge.  Respiratory: Negative for shortness of breath.   Cardiovascular: Negative for chest pain, palpitations and leg swelling.  Gastrointestinal: Negative for nausea, abdominal pain and diarrhea.  Genitourinary: Negative for dysuria.  Musculoskeletal: Negative for falls.  Skin: Negative for rash.  Neurological: Negative for loss of consciousness and headaches.  Endo/Heme/Allergies: Negative for polydipsia.  Psychiatric/Behavioral: Positive for depression and substance abuse. Negative for suicidal ideas. The patient is nervous/anxious. The patient does not have insomnia.     Objective  BP 102/80  Pulse 89  Temp(Src) 98.2 F (36.8 C) (Oral)  Ht 5\' 7"  (1.702 m)  Wt 133 lb (60.328 kg)  BMI 20.83 kg/m2  SpO2 99%  Physical Exam  Physical Exam  Constitutional: She is oriented to person, place, and time and well-developed, well-nourished, and in no distress. No distress.  HENT:  Head: Normocephalic and atraumatic.  Eyes: Conjunctivae are normal.  Neck: Neck supple. No thyromegaly present.  Cardiovascular: Normal rate, regular rhythm and normal heart sounds.   No murmur heard. Pulmonary/Chest: Effort normal and breath sounds normal. She has no wheezes.  Abdominal: She exhibits no distension and no mass.  Musculoskeletal: She exhibits no edema.  Lymphadenopathy:    She has no cervical adenopathy.  Neurological: She is alert and oriented to person, place, and time.  Skin: Skin is warm and dry. No rash noted. She is not diaphoretic.  Psychiatric: Memory, affect and judgment normal.    Lab Results  Component Value Date   TSH 2.326 09/18/2013   Lab Results  Component Value Date   WBC 5.8 09/18/2013   HGB 12.3 09/18/2013   HCT 36.4 09/18/2013   MCV 90.1 09/18/2013   PLT 268 09/18/2013   Lab Results   Component Value Date   CREATININE 0.8 06/26/2013   BUN 13.9 06/26/2013   NA 139 06/26/2013   K 3.8 06/26/2013   CL 100 05/20/2013   CO2 25 06/26/2013   Lab Results  Component Value Date   ALT 43 06/26/2013   AST 33 06/26/2013   ALKPHOS 133 06/26/2013   BILITOT 0.47 06/26/2013   Lab Results  Component Value Date   CHOL 201* 01/13/2013   Lab Results  Component Value Date   HDL 65.70 01/13/2013   Lab Results  Component Value Date   LDLCALC 89 10/21/2012   Lab Results  Component Value Date   TRIG 158.0* 01/13/2013   Lab Results  Component Value Date   CHOLHDL 3 01/13/2013     Assessment & Plan  Alcohol abuse Agrees to try to stop drinking again at this time is interested in some outpatient counseling would prefer to proceed with care in [place].   Tachycardia Well controlled today  History of chicken pox Declines Zostavax today  Preventative health care Declines Pneumonia vaccine today

## 2013-09-20 NOTE — Assessment & Plan Note (Signed)
Agrees to try to stop drinking again at this time is interested in some outpatient counseling would prefer to proceed with care in [place].

## 2013-09-21 ENCOUNTER — Telehealth: Payer: Self-pay

## 2013-09-21 NOTE — Telephone Encounter (Signed)
Message copied by Eulis Manly on Mon Sep 21, 2013  2:47 PM ------      Message from: [name] A      Created: Sat Sep 19, 2013  9:41 PM       Notify normal ------

## 2013-09-21 NOTE — Telephone Encounter (Signed)
Patient informed of results.  

## 2013-09-22 ENCOUNTER — Other Ambulatory Visit: Payer: Self-pay | Admitting: Family Medicine

## 2013-09-22 DIAGNOSIS — F1010 Alcohol abuse, uncomplicated: Secondary | ICD-10-CM

## 2013-09-24 ENCOUNTER — Other Ambulatory Visit: Payer: Self-pay | Admitting: Family Medicine

## 2013-09-25 ENCOUNTER — Telehealth: Payer: Self-pay

## 2013-09-25 NOTE — Telephone Encounter (Signed)
Pt left a message stating that MD was supposed to make a referral for a DR in [place] and she hasn't heard anything. Please advise?

## 2013-09-25 NOTE — Telephone Encounter (Signed)
I will call the pt Monday. The address and phone number is [address]  [place], [place] [address] [phone]

## 2013-09-25 NOTE — Telephone Encounter (Signed)
I put a referral through earlier in the week. There is a Administrator in [place] that does alcohol rehab. She can call them herself and make appt. I do not have the phone number in front of me

## 2013-09-28 NOTE — Telephone Encounter (Signed)
Left a detailed message on patients vm 

## 2013-09-29 ENCOUNTER — Telehealth: Payer: Self-pay | Admitting: *Deleted

## 2013-09-29 NOTE — Telephone Encounter (Signed)
Anne [name] has spoke to patient

## 2013-09-29 NOTE — Telephone Encounter (Signed)
Patient left vm requesting info on referral. Patient requested that Neysa Bonito give her a call back.

## 2013-10-06 ENCOUNTER — Telehealth: Payer: Self-pay | Admitting: Family Medicine

## 2013-10-06 NOTE — Telephone Encounter (Signed)
Good morning, can you check on this in the morning

## 2013-10-06 NOTE — Telephone Encounter (Signed)
Pt is trying to set up appt with [facility], phone numbers are disconnected. Is there another office you recommend?

## 2013-10-06 NOTE — Telephone Encounter (Signed)
I was hoping to find her a place in [place] for alcohol withdrawal/counseling if no one there she is willing to go to [facility] for this and I believe [facility]? In [place] does it also. See if you can find something and I will look into it again

## 2013-10-07 NOTE — Telephone Encounter (Signed)
Lm on VM awaiting return call, patient can try [facility] in [place] [phone]

## 2013-10-07 NOTE — Telephone Encounter (Signed)
Pt is going to call and sch appt

## 2013-10-15 ENCOUNTER — Ambulatory Visit: Payer: Medicare Other | Admitting: Internal Medicine

## 2013-10-19 ENCOUNTER — Encounter: Payer: Self-pay | Admitting: Internal Medicine

## 2013-10-19 ENCOUNTER — Ambulatory Visit (INDEPENDENT_AMBULATORY_CARE_PROVIDER_SITE_OTHER): Payer: Medicare Other | Admitting: Internal Medicine

## 2013-10-19 VITALS — BP 127/82 | HR 93 | Ht 67.0 in | Wt 132.6 lb

## 2013-10-19 DIAGNOSIS — I679 Cerebrovascular disease, unspecified: Secondary | ICD-10-CM

## 2013-10-19 DIAGNOSIS — R42 Dizziness and giddiness: Secondary | ICD-10-CM

## 2013-10-19 DIAGNOSIS — R002 Palpitations: Secondary | ICD-10-CM

## 2013-10-19 NOTE — Patient Instructions (Signed)
Your physician recommends that you schedule a follow-up appointment in: as needed basis  

## 2013-10-19 NOTE — Progress Notes (Signed)
History of Present Illness: Anne [name] is a 72 y.o. female who returns for followup.  She has a hx of orthostatic intolerance, carotid stenosis, HL, COPD, anemia, GERD, eosinophilic esophagitis, breast CA. Echo 3/14: Mild LVH, EF 55-60%, grade 1 diastolic dysfunction. Event monitor 01/2013: NSR, extensive PVCs. Carotid US 3/14: RICA 0-39%, LICA 40-59% (f/u 1 year). Myoview 4/14: Low risk, no scar or ischemia, not gated.  She has been treated with low dose metoprolol for PVCs and palps.  She did not tolerate metoprolol  Felt bad. She has been seen by Wende Mott.  He placed her on very low dose diltiazem  To use if symptomatic  She denies symptoms  NO palpitations  Breathing OK  Patient's husband says she is anxious bout another reaction like with metoprolol  Staying active.  No CP    Wt Readings from Last 3 Encounters:  10/19/13 132 lb 9.6 oz (60.147 kg)  09/18/13 133 lb (60.328 kg)  08/27/13 132 lb 1.9 oz (59.929 kg)     Past Medical History  Diagnosis Date  . Anemia   . COPD (chronic obstructive pulmonary disease)   . Hyperlipidemia   . Pneumonia 2-10    pleurisy  . Arthritis   . Depression   . GERD (gastroesophageal reflux disease) 09/29/2009    improved s/p cholecystectomy and esophagus dilatation  . History of chicken pox   . History of shingles     2 episodes  . History of measles   . Osteopenia 03/14/2011  . 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 Pure hypercholesterolemia 10/17/2010  . 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 ESOPHAGEAL STRICTURE 03/29/2009  . CAROTID ARTERY STENOSIS 01/13/2010  . ALKALINE PHOSPHATASE, ELEVATED 03/15/2009  . Anxiety and depression 04/28/2011  . Vaginitis 05/22/2011  . Baker's cyst of knee 05/22/2011  . Trauma 10/18/2011  . Radial neck fracture 10/2011    minimally displaced  . Atypical chest pain 11/30/2011  . Folliculitis of nose 12/31/2011  . Esophageal ring   . 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 Urinary incontinence 03/19/2012  . Allergic state 06/10/2012  . Hematuria   . Dysphagia   . URI (upper respiratory infection) 09/02/2012  . Preventative health care 10/21/2012  . Dermatitis 11/23/2012  . Fall 11/23/2012  . Hx of echocardiogram     a. Echo 01/2103: mild LVH, EF 55-60%, normal wall motion, Gr 1 diast dysfn  . Knee pain, bilateral 07/23/2011  . Elevated sed rate 08/02/2013    Current Outpatient Prescriptions  Medication Sig Dispense Refill  . amitriptyline (ELAVIL) 150 MG tablet take 1 tablet by mouth at bedtime  30 tablet  2  . anastrozole (ARIMIDEX) 1 MG tablet Take 1 tablet (1 mg total) by mouth daily.  90 tablet  2  . aspirin 81 MG tablet Take 81 mg by mouth daily.        [name] atorvastatin (LIPITOR) 40 MG tablet Take 1 tablet (40 mg total) by mouth daily.  30 tablet  0  . Cholecalciferol (VITAMIN D3) 1000 UNITS CAPS Take 2 tablets by mouth daily.        [name] ibuprofen (ADVIL,MOTRIN) 800 MG tablet Take 800 mg by mouth as needed.      [name] LORazepam (ATIVAN) 1 MG tablet Take 1 tablet (1 mg total) by mouth every 8 (eight) hours as needed for anxiety or sleep.  70 tablet  1  .  pantoprazole (PROTONIX) 40 MG tablet Take 1 tablet (40 mg total) by mouth 2 (two) times daily before a meal. 30-60 minutes before breakfast and supper  180 tablet  0  . Probiotic Product (PROBIOTIC FORMULA PO) Take 1 capsule by mouth daily.      . vitamin B-12 (CYANOCOBALAMIN) 1000 MCG tablet Take 1,000 mcg by mouth daily.         No current facility-administered medications for this visit.    Allergies:    Allergies  Allergen Reactions  . Codeine Other (See Comments)    "flu like symptoms"  . Diphenhydramine Hcl Other (See Comments)    "nervous and upset"  . Morphine Other (See Comments)    "flu like symptoms"  . Peanut-Containing Drug Products Hives  . Zofran Other (See Comments)    HEADACHE    Social History:  The patient  reports that she quit smoking about 6 years ago. Her smoking use  included Cigarettes. She smoked 0.00 packs per day. She has never used smokeless tobacco. She reports that she drinks about 0.6 ounces of alcohol per week. She reports that she does not use illicit drugs.   ROS:  Please see the history of present illness.     All other systems reviewed and negative.   PHYSICAL EXAM: VS:  BP 127/82  Pulse 93  Ht 5\' 7"  (1.702 m)  Wt 132 lb 9.6 oz (60.147 kg)  BMI 20.76 kg/m2  Filed Vitals:   10/19/13 1615  BP: 127/82  Pulse: 93  Height: 5\' 7"  (1.702 m)  Weight: 132 lb 9.6 oz (60.147 kg)     Well nourished, well developed, in no acute distress HEENT: normal Neck: no JVD Endocrine: No thyromegaly Cardiac:  normal S1, S2; RRR; no murmur Lungs:  clear to auscultation bilaterally, no wheezing, rhonchi or rales Abd: soft, nontender, no hepatomegaly Ext: no edema Skin: warm and dry Neuro:  CNs 2-12 intact, no focal abnormalities noted  EKG:  NSR, HR 94, normal axis, interventricular conduction delay, PVCs, no change from prior tracing     ASSESSMENT AND PLAN:  1. Orthostatic Intolerance: Patient denies dizziness 2. Palpitations: She denis symptoms.  Follow  Use low dose dilt as needed. 3. Hyperlipidemia: Continue statin. 4. Carotid stenosis: Follow up Dopplers due 01/2014

## 2013-10-27 ENCOUNTER — Ambulatory Visit: Payer: Medicare Other | Admitting: Family Medicine

## 2013-11-09 ENCOUNTER — Telehealth: Payer: Self-pay | Admitting: Internal Medicine

## 2013-11-09 MED ORDER — PANTOPRAZOLE SODIUM 40 MG PO TBEC: 40.0000 mg | DELAYED_RELEASE_TABLET | Freq: Two times a day (BID) | ORAL | Status: DC

## 2013-11-09 NOTE — Telephone Encounter (Signed)
Refill sent in as requested. 

## 2013-11-24 ENCOUNTER — Telehealth: Payer: Self-pay | Admitting: *Deleted

## 2013-11-24 DIAGNOSIS — Z853 Personal history of malignant neoplasm of breast: Secondary | ICD-10-CM

## 2013-11-24 MED ORDER — ANASTROZOLE 1 MG PO TABS
1.0000 mg | ORAL_TABLET | Freq: Every day | ORAL | Status: DC
Start: 2013-11-24 — End: 2013-11-24

## 2013-11-24 MED ORDER — ANASTROZOLE 1 MG PO TABS: 1.0000 mg | ORAL_TABLET | Freq: Every day | ORAL | Status: DC

## 2013-11-24 NOTE — Telephone Encounter (Signed)
Rec'd voicemail from patient that she will need a local fill at [facility] on her Arimidex.  They have changed mail order pharmacy, now using [facility]. Also sent to mail order for 90days per request.

## 2013-11-28 ENCOUNTER — Other Ambulatory Visit: Payer: Self-pay | Admitting: Family Medicine

## 2013-11-30 ENCOUNTER — Telehealth: Payer: Self-pay

## 2013-11-30 ENCOUNTER — Telehealth: Payer: Self-pay | Admitting: Family Medicine

## 2013-11-30 MED ORDER — LORAZEPAM 1 MG PO TABS: 1.0000 mg | ORAL_TABLET | Freq: Three times a day (TID) | ORAL | Status: DC | PRN

## 2013-11-30 NOTE — Telephone Encounter (Signed)
Opened in error

## 2013-11-30 NOTE — Telephone Encounter (Signed)
Please advise Lorazepam refill? Last RX was done on 09-18-13 quantity 70 with 1 refill  If ok fax to [phone]

## 2013-11-30 NOTE — Telephone Encounter (Signed)
OK to send 70 tabs, no refills.

## 2013-11-30 NOTE — Telephone Encounter (Signed)
RX for Lorazepam quantity 70 and 1 refill shredded.  New rx with no refill resent

## 2013-12-01 ENCOUNTER — Telehealth: Payer: Self-pay | Admitting: Family Medicine

## 2013-12-01 ENCOUNTER — Other Ambulatory Visit: Payer: Self-pay | Admitting: Family Medicine

## 2013-12-01 DIAGNOSIS — C50919 Malignant neoplasm of unspecified site of unspecified female breast: Secondary | ICD-10-CM

## 2013-12-01 NOTE — Telephone Encounter (Signed)
Can you please place order to Dr Humphrey Rolls?

## 2013-12-01 NOTE — Telephone Encounter (Signed)
With this [employer] insurance, a referral must be placed from PCP to specialist

## 2013-12-01 NOTE — Telephone Encounter (Signed)
Message copied by Synthia Innocent on Tue Dec 01, 2013  7:22 AM ------      Message from: Irven Baltimore      Created: Mon Nov 30, 2013  3:10 PM       Patient has the new [employer] that requires a referral. She states that she will be having an upcoming appointment with her oncologist, Dr. Humphrey Rolls. Does she still need a new referral for this? ------

## 2013-12-01 NOTE — Telephone Encounter (Signed)
I do not really understand how this new process works please clarify, for now I will place updated referral in system

## 2013-12-01 NOTE — Telephone Encounter (Signed)
Thanks, i am catching on

## 2013-12-07 ENCOUNTER — Ambulatory Visit (HOSPITAL_COMMUNITY): Payer: Medicare Other | Admitting: Psychiatry

## 2013-12-16 ENCOUNTER — Other Ambulatory Visit: Payer: Self-pay | Admitting: Family Medicine

## 2013-12-30 ENCOUNTER — Telehealth: Payer: Self-pay | Admitting: Family Medicine

## 2013-12-30 MED ORDER — LORAZEPAM 1 MG PO TABS: 1.0000 mg | ORAL_TABLET | Freq: Three times a day (TID) | ORAL | Status: DC | PRN

## 2013-12-30 NOTE — Telephone Encounter (Signed)
New rx request  lorazepam

## 2014-01-01 ENCOUNTER — Ambulatory Visit (INDEPENDENT_AMBULATORY_CARE_PROVIDER_SITE_OTHER): Payer: Commercial Managed Care - HMO | Admitting: Family Medicine

## 2014-01-01 ENCOUNTER — Encounter: Payer: Self-pay | Admitting: Family Medicine

## 2014-01-01 VITALS — BP 108/80 | HR 98 | Temp 98.3°F | Resp 16 | Ht 67.0 in | Wt 136.5 lb

## 2014-01-01 DIAGNOSIS — M25561 Pain in right knee: Secondary | ICD-10-CM

## 2014-01-01 DIAGNOSIS — M25569 Pain in unspecified knee: Secondary | ICD-10-CM

## 2014-01-01 DIAGNOSIS — M7120 Synovial cyst of popliteal space [Baker], unspecified knee: Secondary | ICD-10-CM

## 2014-01-01 DIAGNOSIS — L309 Dermatitis, unspecified: Secondary | ICD-10-CM

## 2014-01-01 DIAGNOSIS — M25562 Pain in left knee: Secondary | ICD-10-CM

## 2014-01-01 DIAGNOSIS — L259 Unspecified contact dermatitis, unspecified cause: Secondary | ICD-10-CM

## 2014-01-01 MED ORDER — FEXOFENADINE HCL 180 MG PO TABS: ORAL_TABLET | ORAL | Status: DC

## 2014-01-01 MED ORDER — TRIAMCINOLONE ACETONIDE 0.025 % EX OINT: 1.0000 "application " | TOPICAL_OINTMENT | Freq: Two times a day (BID) | CUTANEOUS | Status: DC | PRN

## 2014-01-01 NOTE — Patient Instructions (Addendum)
Biotin, krill oil and zinc roughly 50 mg daily Try topical twice daily such as Aspercreme  Contact Dermatitis Contact dermatitis is a reaction to certain substances that touch the skin. Contact dermatitis can be either irritant contact dermatitis or allergic contact dermatitis. Irritant contact dermatitis does not require previous exposure to the substance for a reaction to occur.Allergic contact dermatitis only occurs if you have been exposed to the substance before. Upon a repeat exposure, your body reacts to the substance.  CAUSES  Many substances can cause contact dermatitis. Irritant dermatitis is most commonly caused by repeated exposure to mildly irritating substances, such as:  Makeup.  Soaps.  Detergents.  Bleaches.  Acids.  Metal salts, such as nickel. Allergic contact dermatitis is most commonly caused by exposure to:  Poisonous plants.  Chemicals (deodorants, shampoos).  Jewelry.  Latex.  Neomycin in triple antibiotic cream.  Preservatives in products, including clothing. SYMPTOMS  The area of skin that is exposed may develop:  Dryness or flaking.  Redness.  Cracks.  Itching.  Pain or a burning sensation.  Blisters. With allergic contact dermatitis, there may also be swelling in areas such as the eyelids, mouth, or genitals.  DIAGNOSIS  Your caregiver can usually tell what the problem is by doing a physical exam. In cases where the cause is uncertain and an allergic contact dermatitis is suspected, a patch skin test may be performed to help determine the cause of your dermatitis. TREATMENT Treatment includes protecting the skin from further contact with the irritating substance by avoiding that substance if possible. Barrier creams, powders, and gloves may be helpful. Your caregiver may also recommend:  Steroid creams or ointments applied 2 times daily. For best results, soak the rash area in cool water for 20 minutes. Then apply the medicine. Cover  the area with a plastic wrap. You can store the steroid cream in the refrigerator for a "chilly" effect on your rash. That may decrease itching. Oral steroid medicines may be needed in more severe cases.  Antibiotics or antibacterial ointments if a skin infection is present.  Antihistamine lotion or an antihistamine taken by mouth to ease itching.  Lubricants to keep moisture in your skin.  Burow's solution to reduce redness and soreness or to dry a weeping rash. Mix one packet or tablet of solution in 2 cups cool water. Dip a clean washcloth in the mixture, wring it out a bit, and put it on the affected area. Leave the cloth in place for 30 minutes. Do this as often as possible throughout the day.  Taking several cornstarch or baking soda baths daily if the area is too large to cover with a washcloth. Harsh chemicals, such as alkalis or acids, can cause skin damage that is like a burn. You should flush your skin for 15 to 20 minutes with cold water after such an exposure. You should also seek immediate medical care after exposure. Bandages (dressings), antibiotics, and pain medicine may be needed for severely irritated skin.  HOME CARE INSTRUCTIONS  Avoid the substance that caused your reaction.  Keep the area of skin that is affected away from hot water, soap, sunlight, chemicals, acidic substances, or anything else that would irritate your skin.  Do not scratch the rash. Scratching may cause the rash to become infected.  You may take cool baths to help stop the itching.  Only take over-the-counter or prescription medicines as directed by your caregiver.  See your caregiver for follow-up care as directed to make sure your  skin is healing properly. SEEK MEDICAL CARE IF:   Your condition is not better after 3 days of treatment.  You seem to be getting worse.  You see signs of infection such as swelling, tenderness, redness, soreness, or warmth in the affected area.  You have any  problems related to your medicines. Document Released: 10/26/2000 Document Revised: 01/21/2012 Document Reviewed: 04/03/2011 [facility] Patient Information 2014 [place], [place].

## 2014-01-01 NOTE — Progress Notes (Signed)
Pre visit review using our clinic review tool, if applicable. No additional management support is needed unless otherwise documented below in the visit note/SLS  

## 2014-01-03 NOTE — Assessment & Plan Note (Signed)
Secondary to Rogaine, right emple, improving, given Triamcinolone cream and will need to see dermatology if no resolution.

## 2014-01-03 NOTE — Progress Notes (Signed)
Patient ID: Anne [name], female   DOB: 1940-12-08, 73 y.o.   MRN: [id number] Anne [name] [id number] [birth year] 01/03/2014      Progress Note-Follow Up  Subjective  Chief Complaint  Chief Complaint  Patient presents with  . Hair/Scalp Problem    Pt c/o white coating on Left side of scalp & ear, believed to be results of using women's Rogaine    HPI  Patient is a 73 year old Caucasian female in today for discussion of dermatitis. She was using these were creatinine and developed and irritated itchy lesion on her left temple. Since stopping it is improving. No further concerns. No chest pain, palpitations, shortness of breath her other complaint today is of persistent knee pain right greater the left. She's recently had a cortisone shot from her rheumatologist Dr. Charlestine Night but this has not been helpful. Walking is painful  Past Medical History  Diagnosis Date  . Anemia   . COPD (chronic obstructive pulmonary disease)   . Hyperlipidemia   . Pneumonia 2-10    pleurisy  . Arthritis   . Depression   . GERD (gastroesophageal reflux disease) 09/29/2009    improved s/p cholecystectomy and esophagus dilatation  . History of chicken pox   . History of shingles     2 episodes  . History of measles   . Osteopenia 03/14/2011  . 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 Pure hypercholesterolemia 10/17/2010  . 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 ESOPHAGEAL STRICTURE 03/29/2009  . CAROTID ARTERY STENOSIS 01/13/2010  . ALKALINE PHOSPHATASE, ELEVATED 03/15/2009  . Anxiety and depression 04/28/2011  . Vaginitis 05/22/2011  . Baker's cyst of knee 05/22/2011  . Trauma 10/18/2011  . Radial neck fracture 10/2011    minimally displaced  . Atypical chest pain 11/30/2011  . Folliculitis of nose [id number]  . Esophageal ring   . 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 Urinary incontinence 03/19/2012  . Allergic state 06/10/2012  . Hematuria   . Dysphagia   .  URI (upper respiratory infection) 09/02/2012  . Preventative health care 10/21/2012  . Dermatitis 11/23/2012  . Fall 11/23/2012  . Hx of echocardiogram     a. Echo 01/2103: mild LVH, EF 55-60%, normal wall motion, Gr 1 diast dysfn  . Knee pain, bilateral 07/23/2011  . Elevated sed rate 08/02/2013    Past Surgical History  Procedure Laterality Date  . Anterior cruciate ligament repair  08, 09, 10  . Ercp, cbd stone extraction  2010  . Cholecystectomy  2010  . Egd/ dili  2010, 2012  . Breast surgery  2001    lumpectomy (01)  . Colonoscopy  09/05/10    cecal avm's  . Esophagogastroduodenoscopy  01/08/2012    Procedure: ESOPHAGOGASTRODUODENOSCOPY (EGD);  Surgeon: Gatha Mayer, MD;  Location: [facility];  Service: Endoscopy;  Laterality: N/A;  . Savory dilation  01/08/2012    Procedure: SAVORY DILATION;  Surgeon: Gatha Mayer, MD;  Location: [facility];  Service: Endoscopy;  Laterality: N/A;  need xray  . Mastectomy modified radical      , Mastectomy modified radical (08), breast reconstruction, CA lesions excised lateral abd wall 2010  . Breast reconstructive surgery  2008, 2009, 2010    Family History  Problem Relation Age of Onset  . Heart disease Father   . Lung cancer Father     smoker  . Cirrhosis Sister     Primary Biliary  .  Breast cancer    . Stroke Maternal Grandmother   . Alcohol abuse Maternal Grandfather   . Heart disease Paternal Grandfather   . Anxiety disorder Sister   . Osteoporosis Sister   . Arthritis Sister     Rheumatoid  . Skin cancer Sister   . Osteoporosis Sister   . Cancer Sister     multiple skin cancers, over 38 excisions.  . Other Mother     tic deleauroux  . Anesthesia problems Neg Hx   . Hypotension Neg Hx   . Malignant hyperthermia Neg Hx   . Pseudochol deficiency Neg Hx   . Colon cancer Neg Hx   . Ovarian cancer    . Pancreatic cancer      History   Social History  . Marital Status: Married    Spouse Name: N/A    Number of  Children: N/A  . Years of Education: N/A   Occupational History  . Not on file.   Social History Main Topics  . Smoking status: Former Smoker    Types: Cigarettes    Quit date: 05/22/2007  . Smokeless tobacco: Never Used  . Alcohol Use: 0.6 oz/week    1 Glasses of wine per week     Comment: 6 oz daily  . Drug Use: No  . Sexual Activity: Yes    Partners: Male   Other Topics Concern  . Not on file   Social History Narrative  . No narrative on file    Current Outpatient Prescriptions on File Prior to Visit  Medication Sig Dispense Refill  . amitriptyline (ELAVIL) 150 MG tablet take 1 tablet by mouth at bedtime  30 tablet  2  . anastrozole (ARIMIDEX) 1 MG tablet Take 1 tablet (1 mg total) by mouth daily.  90 tablet  1  . aspirin 81 MG tablet Take 81 mg by mouth daily.        [name] atorvastatin (LIPITOR) 40 MG tablet Take 1 tablet (40 mg total) by mouth daily.  30 tablet  0  . Cholecalciferol (VITAMIN D3) 1000 UNITS CAPS Take 2 tablets by mouth daily.        [name] ibuprofen (ADVIL,MOTRIN) 800 MG tablet Take 800 mg by mouth as needed.      [name] LORazepam (ATIVAN) 1 MG tablet Take 1 tablet (1 mg total) by mouth every 8 (eight) hours as needed for anxiety or sleep.  70 tablet  0  . pantoprazole (PROTONIX) 40 MG tablet Take 1 tablet (40 mg total) by mouth 2 (two) times daily before a meal. 30-60 minutes before breakfast and supper  180 tablet  0  . Probiotic Product (PROBIOTIC FORMULA PO) Take 1 capsule by mouth daily.      . vitamin B-12 (CYANOCOBALAMIN) 1000 MCG tablet Take 1,000 mcg by mouth daily.         No current facility-administered medications on file prior to visit.    Allergies  Allergen Reactions  . Codeine Other (See Comments)    "flu like symptoms"  . Diphenhydramine Hcl Other (See Comments)    "nervous and upset"  . Morphine Other (See Comments)    "flu like symptoms"  . Peanut-Containing Drug Products Hives  . Zofran Other (See Comments)    HEADACHE    Review of  Systems  Review of Systems  Constitutional: Negative for fever and malaise/fatigue.  HENT: Negative for congestion.   Eyes: Negative for discharge.  Respiratory: Negative for shortness of breath.   Cardiovascular: Negative for  chest pain, palpitations and leg swelling.  Gastrointestinal: Negative for nausea, abdominal pain and diarrhea.  Genitourinary: Negative for dysuria.  Musculoskeletal: Positive for joint pain. Negative for falls.       Knee pain R>L  Skin: Positive for itching and rash.  Neurological: Negative for loss of consciousness and headaches.  Endo/Heme/Allergies: Negative for polydipsia.  Psychiatric/Behavioral: Negative for depression and suicidal ideas. The patient is not nervous/anxious and does not have insomnia.     Objective  BP 108/80  Pulse 98  Temp(Src) 98.3 F (36.8 C) (Oral)  Resp 16  Ht 5\' 7"  (1.702 m)  Wt 136 lb 8 oz (61.916 kg)  BMI 21.37 kg/m2  SpO2 96%  Physical Exam  Physical Exam  Constitutional: She is oriented to person, place, and time and well-developed, well-nourished, and in no distress. No distress.  HENT:  Head: Normocephalic and atraumatic.  Eyes: Conjunctivae are normal.  Neck: Neck supple. No thyromegaly present.  Cardiovascular: Normal rate, regular rhythm and normal heart sounds.   No murmur heard. Pulmonary/Chest: Effort normal and breath sounds normal. She has no wheezes.  Abdominal: She exhibits no distension and no mass.  Musculoskeletal: She exhibits no edema.  Lymphadenopathy:    She has no cervical adenopathy.  Neurological: She is alert and oriented to person, place, and time.  Skin: Skin is warm and dry. Rash noted. She is not diaphoretic. There is erythema.  Scaly erythematous patch left temple  Psychiatric: Memory, affect and judgment normal.    Lab Results  Component Value Date   TSH 2.326 09/18/2013   Lab Results  Component Value Date   WBC 5.8 09/18/2013   HGB 12.3 09/18/2013   HCT 36.4 09/18/2013    MCV 90.1 09/18/2013   PLT 268 09/18/2013   Lab Results  Component Value Date   CREATININE 0.8 06/26/2013   BUN 13.9 06/26/2013   NA 139 06/26/2013   K 3.8 06/26/2013   CL 100 05/20/2013   CO2 25 06/26/2013   Lab Results  Component Value Date   ALT 43 06/26/2013   AST 33 06/26/2013   ALKPHOS 133 06/26/2013   BILITOT 0.47 06/26/2013   Lab Results  Component Value Date   CHOL 201* 01/13/2013   Lab Results  Component Value Date   HDL 65.70 01/13/2013   Lab Results  Component Value Date   LDLCALC 89 10/21/2012   Lab Results  Component Value Date   TRIG 158.0* 01/13/2013   Lab Results  Component Value Date   CHOLHDL 3 01/13/2013     Assessment & Plan  Dermatitis Secondary to Rogaine, right emple, improving, given Triamcinolone cream and will need to see dermatology if no resolution.  Baker's cyst of knee Persistent pain despite intermittent treatment. Is referred to sports medicine for possible intervention. Continue to use topicals

## 2014-01-03 NOTE — Assessment & Plan Note (Signed)
Persistent pain despite intermittent treatment. Is referred to sports medicine for possible intervention. Continue to use topicals

## 2014-01-04 ENCOUNTER — Other Ambulatory Visit: Payer: Commercial Managed Care - HMO | Admitting: Adult Health

## 2014-01-04 ENCOUNTER — Other Ambulatory Visit: Payer: Self-pay | Admitting: Adult Health

## 2014-01-04 DIAGNOSIS — Z5181 Encounter for therapeutic drug level monitoring: Secondary | ICD-10-CM

## 2014-01-04 DIAGNOSIS — Z853 Personal history of malignant neoplasm of breast: Secondary | ICD-10-CM

## 2014-01-04 DIAGNOSIS — Z79811 Long term (current) use of aromatase inhibitors: Secondary | ICD-10-CM

## 2014-01-05 ENCOUNTER — Ambulatory Visit: Payer: Commercial Managed Care - HMO | Admitting: Family Medicine

## 2014-01-05 ENCOUNTER — Other Ambulatory Visit: Payer: Self-pay | Admitting: Adult Health

## 2014-01-05 DIAGNOSIS — Z853 Personal history of malignant neoplasm of breast: Secondary | ICD-10-CM

## 2014-01-21 ENCOUNTER — Ambulatory Visit (INDEPENDENT_AMBULATORY_CARE_PROVIDER_SITE_OTHER): Payer: Commercial Managed Care - HMO | Admitting: Family Medicine

## 2014-01-21 ENCOUNTER — Encounter: Payer: Self-pay | Admitting: Family Medicine

## 2014-01-21 ENCOUNTER — Other Ambulatory Visit (INDEPENDENT_AMBULATORY_CARE_PROVIDER_SITE_OTHER): Payer: Commercial Managed Care - HMO

## 2014-01-21 VITALS — BP 118/62 | HR 93 | Temp 98.6°F | Resp 16 | Wt 136.1 lb

## 2014-01-21 DIAGNOSIS — M7120 Synovial cyst of popliteal space [Baker], unspecified knee: Secondary | ICD-10-CM

## 2014-01-21 DIAGNOSIS — M160 Bilateral primary osteoarthritis of hip: Secondary | ICD-10-CM | POA: Insufficient documentation

## 2014-01-21 DIAGNOSIS — M1710 Unilateral primary osteoarthritis, unspecified knee: Secondary | ICD-10-CM | POA: Insufficient documentation

## 2014-01-21 NOTE — Progress Notes (Signed)
[facility] [address] [place], [place] [address] Phone: [phone] Subjective:    I'm seeing this patient by the request  of:  Penni Homans, MD   CC: Right knee pain and swelling  [id number] Anne [name] is a 73 y.o. female coming in with complaint of right knee pain and swelling. Patient states that she has had this intermittently for multiple years. Patient states that greater than 8 years ago she had an aspiration of the knee for a cyst. Patient states since that time and start slowly increasing in size. Patient states that it seems to fluctuate in size fairly regularly. Patient states that most of it causes some mild pain with flexion of the knee or squatting a decent amount of time. Patient has all the pain is on the posterior aspect as well as the swelling on the knee is in the posterior aspect. Patient has not tried any other home remedies other than some over-the-counter medications with minimal improvement. Patient denies any of her seems to give out on her ovary radiation of the leg or any significant weakness. Patient does have weakness overall though she's been dealing with. Patient rates the severity of the pain 4/10. Patient wishes to further evaluation.     Past medical history, social, surgical and family history all reviewed in electronic medical record.   Review of Systems: No headache, visual changes, nausea, vomiting, diarrhea, constipation, dizziness, abdominal pain, skin rash, fevers, chills, night sweats, weight loss, swollen lymph nodes, body aches, joint swelling, muscle aches, chest pain, shortness of breath, mood changes.   Objective Blood pressure 118/62, pulse 93, temperature 98.6 F (37 C), temperature source Oral, resp. rate 16, weight 136 lb 1.3 oz (61.725 kg), SpO2 93.00%.  General: No apparent distress alert and oriented x3 mood and affect normal, dressed appropriately.  HEENT: Pupils equal, extraocular movements  intact  Respiratory: Patient's speak in full sentences and does not appear short of breath  Cardiovascular: No lower extremity edema, non tender, no erythema  Skin: Warm dry intact with no signs of infection or rash on extremities or on axial skeleton.  Abdomen: Soft nontender  Neuro: Cranial nerves II through XII are intact, neurovascularly intact in all extremities with 2+ DTRs and 2+ pulses.  Lymph: No lymphadenopathy of posterior or anterior cervical chain or axillae bilaterally.  Gait shows patient does walk with a wide-based gait  MSK:  Non tender with full range of motion and good stability and symmetric strength and tone of shoulders, elbows, wrist, hip,  and ankles bilaterally. Patient does have osteoarthritic changes in multiple joints. Knee: Right Normal to inspection patient does have swelling of the posterior fossa that seems to be a Baker cyst measuring approximately 3 cm in diameter Palpation does show that patient has some mild medial joint line tenderness ROM full in flexion and extension and lower leg rotation. Ligaments with solid consistent endpoints including ACL, PCL, LCL, MCL. Negative Mcmurray's, Apley's, and Thessalonian tests.  painful patellar compression. Patellar glide with moderate crepitus. Patellar and quadriceps tendons unremarkable. Hamstring and quadriceps strength is normal.  Contralateral knee also has osteophytic changes of mild medial joint line tenderness full range of motion and no swelling of the posterior fossa.  MSK US performed of:  Right knee This study was ordered, performed, and interpreted by Charlann Boxer D.O.  Knee: All structures visualized. Anteromedial, anterolateral, posteromedial, and posterolateral menisci unremarkable without tearing, fraying, effusion, or displacement. Patient does have some mild chronic  degenerative changes of the meniscus but no true tear appreciated. Joint lines are narrowed of the medial and lateral  compartments. Patellar Tendon unremarkable on long and transverse views without effusion. No abnormality of prepatellar bursa. LCL and MCL unremarkable on long and transverse views. No abnormality of origin of medial or lateral head of the gastrocnemius. Posterior fossa shows the patient does have a large Baker's cyst that is non-septated measured approximately 3 cm in diameter.  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    Impression and Recommendations:     This case required medical decision making of moderate complexity.

## 2014-01-21 NOTE — Assessment & Plan Note (Signed)
Patient does have a Baker's cyst of his right knee in could be aspirated the patient declined today. We did do a compression sleeve today that ankle be beneficial. We discussed icing protocol after activity as well as exercises at home the can help with the underlying arthritis. Patient come back in 3 weeks and is continuing to have trouble or worsening swelling we'll do an aspiration at that time.

## 2014-01-21 NOTE — Assessment & Plan Note (Signed)
Complete patient's underlying arthritis is likely causing patient's most discomfort throughout the day. Patient's Baker's cyst does bear and can be drained I do not think that this is giving her significant amount of the discomfort. Patient was given a compression sleeve today, home exercise program, we discussed over-the-counter medications he can be beneficial as well. Patient will try these interventions and come back again in 3 weeks. At that time if continued have pain we'll try aspirating the Baker's cyst. If this does not help we will consider an intra-articular injection of the knee and patient may need viscous supplementation secondary to the amount of arthritis in this knee. To get additional followup in 3 weeks

## 2014-01-21 NOTE — Patient Instructions (Signed)
Good to meet you both Try the compression on knee Ice after activity  Take tylenol 650 mg three times a day is the best evidence based medicine we have for arthritis.  Glucosamine sulfate 750mg  twice a day is a supplement that has been shown to help moderate to severe arthritis. Vitamin D 2000 IU daily Fish oil 2 grams daily.  Tumeric 500mg  twice daily.  Capsaicin topically up to four times a day may also help with pain. Cortisone injections are an option if these interventions do not seem to make a difference or need more relief.  If cortisone injections do not help, there are different types of shots that may help but they take longer to take effect.  We can discuss this at follow up.  It's important that you continue to stay active. Shoe inserts with good arch support may be helpful.  [employer] orthotics at [employer] could help.  Water aerobics and cycling with low resistance are the best two types of exercise for arthritis. Come back and see me in 3 weeks. If still pain then we will drain the knee.

## 2014-01-21 NOTE — Progress Notes (Signed)
Pre visit review using our clinic review tool, if applicable. No additional management support is needed unless otherwise documented below in the visit note. 

## 2014-01-27 ENCOUNTER — Telehealth: Payer: Self-pay | Admitting: Oncology

## 2014-01-27 NOTE — Telephone Encounter (Signed)
, °

## 2014-01-28 ENCOUNTER — Other Ambulatory Visit: Payer: Self-pay

## 2014-02-01 ENCOUNTER — Other Ambulatory Visit (HOSPITAL_BASED_OUTPATIENT_CLINIC_OR_DEPARTMENT_OTHER): Payer: Commercial Managed Care - HMO

## 2014-02-01 ENCOUNTER — Ambulatory Visit: Payer: Medicare Other | Admitting: Oncology

## 2014-02-01 ENCOUNTER — Other Ambulatory Visit: Payer: Self-pay | Admitting: Hematology and Oncology

## 2014-02-01 ENCOUNTER — Ambulatory Visit (HOSPITAL_BASED_OUTPATIENT_CLINIC_OR_DEPARTMENT_OTHER): Payer: Commercial Managed Care - HMO | Admitting: Hematology and Oncology

## 2014-02-01 ENCOUNTER — Telehealth: Payer: Self-pay | Admitting: Oncology

## 2014-02-01 ENCOUNTER — Other Ambulatory Visit: Payer: Medicare Other

## 2014-02-01 VITALS — BP 125/81 | HR 90 | Temp 98.2°F | Resp 18 | Ht 67.0 in | Wt 135.5 lb

## 2014-02-01 DIAGNOSIS — E559 Vitamin D deficiency, unspecified: Secondary | ICD-10-CM

## 2014-02-01 DIAGNOSIS — M8580 Other specified disorders of bone density and structure, unspecified site: Secondary | ICD-10-CM

## 2014-02-01 DIAGNOSIS — C50919 Malignant neoplasm of unspecified site of unspecified female breast: Secondary | ICD-10-CM

## 2014-02-01 DIAGNOSIS — M899 Disorder of bone, unspecified: Secondary | ICD-10-CM

## 2014-02-01 DIAGNOSIS — M949 Disorder of cartilage, unspecified: Secondary | ICD-10-CM

## 2014-02-01 DIAGNOSIS — Z853 Personal history of malignant neoplasm of breast: Secondary | ICD-10-CM

## 2014-02-01 LAB — CBC WITH DIFFERENTIAL/PLATELET
BASO%: 0.8 % (ref 0.0–2.0)
Basophils Absolute: 0 10*3/uL (ref 0.0–0.1)
EOS ABS: 0.1 10*3/uL (ref 0.0–0.5)
EOS%: 1.6 % (ref 0.0–7.0)
HEMATOCRIT: 36.5 % (ref 34.8–46.6)
HGB: 12 g/dL (ref 11.6–15.9)
LYMPH%: 26.8 % (ref 14.0–49.7)
MCH: 31.3 pg (ref 25.1–34.0)
MCHC: 32.8 g/dL (ref 31.5–36.0)
MCV: 95.4 fL (ref 79.5–101.0)
MONO#: 0.4 10*3/uL (ref 0.1–0.9)
MONO%: 6.1 % (ref 0.0–14.0)
NEUT%: 64.7 % (ref 38.4–76.8)
NEUTROS ABS: 4 10*3/uL (ref 1.5–6.5)
PLATELETS: 262 10*3/uL (ref 145–400)
RBC: 3.82 10*6/uL (ref 3.70–5.45)
RDW: 14.1 % (ref 11.2–14.5)
WBC: 6.2 10*3/uL (ref 3.9–10.3)
lymph#: 1.7 10*3/uL (ref 0.9–3.3)

## 2014-02-01 LAB — COMPREHENSIVE METABOLIC PANEL (CC13)
ALK PHOS: 90 U/L (ref 40–150)
ALT: 20 U/L (ref 0–55)
ANION GAP: 11 meq/L (ref 3–11)
AST: 17 U/L (ref 5–34)
Albumin: 3.8 g/dL (ref 3.5–5.0)
BUN: 18.2 mg/dL (ref 7.0–26.0)
CO2: 28 meq/L (ref 22–29)
CREATININE: 0.8 mg/dL (ref 0.6–1.1)
Calcium: 9.5 mg/dL (ref 8.4–10.4)
Chloride: 103 mEq/L (ref 98–109)
Glucose: 101 mg/dl (ref 70–140)
Potassium: 3.8 mEq/L (ref 3.5–5.1)
Sodium: 142 mEq/L (ref 136–145)
Total Bilirubin: 0.44 mg/dL (ref 0.20–1.20)
Total Protein: 6.9 g/dL (ref 6.4–8.3)

## 2014-02-01 MED ORDER — ANASTROZOLE 1 MG PO TABS: 1.0000 mg | ORAL_TABLET | Freq: Every day | ORAL | Status: DC

## 2014-02-01 NOTE — Telephone Encounter (Signed)
, °

## 2014-02-02 ENCOUNTER — Other Ambulatory Visit: Payer: Self-pay | Admitting: Family Medicine

## 2014-02-02 ENCOUNTER — Encounter: Payer: Self-pay | Admitting: Hematology and Oncology

## 2014-02-02 LAB — VITAMIN D 25 HYDROXY (VIT D DEFICIENCY, FRACTURES): VIT D 25 HYDROXY: 58 ng/mL (ref 30–89)

## 2014-02-02 NOTE — Telephone Encounter (Signed)
rx printed for md to sign and fax 

## 2014-02-02 NOTE — Progress Notes (Signed)
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 She was treated systemically with CMF followed by 5 years of tamoxifen.  #2 second diagnosis was in January 2008 patient did not undergo surgery until July 2008. She also did not receive any adjuvant systemic treatment by  her decision. Her course was complicated with difficulty healing after her right breast reconstruction. She had skin recurrence lateral chest wall excised in September 2000 and then treated with Xeloda from October 2010 through February 2011. Patient has been on Arimidex since July 2011.  #3  CURRENT THERAPY: Arimidex 1 mg daily  INTERVAL HISTORY: Anne [name] 73 y.o. female returns for followup of her breast cancer Patient states that she has been very weak tired and fatigued. She has had multiple workups including from her primary care physician as well as Dr. Leroy Kennedy from GI.She has no nausea or vomiting no abdominal pain. No fevers or chills.No severe bone pain,no cough.Patient with mild anemia previously.    Remainder of the 10 point review of systems is negative.  MEDICAL HISTORY: Past Medical History  Diagnosis Date  . Anemia   . COPD (chronic obstructive pulmonary disease)   . Hyperlipidemia   . Pneumonia 2-10    pleurisy  . Arthritis   . Depression   . GERD (gastroesophageal reflux disease) 09/29/2009    improved s/p cholecystectomy and esophagus dilatation  . History of chicken pox   . History of shingles     2 episodes  . History of measles   . Osteopenia 03/14/2011  . 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 Pure hypercholesterolemia 10/17/2010  . 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 ESOPHAGEAL STRICTURE  03/29/2009  . CAROTID ARTERY STENOSIS 01/13/2010  . ALKALINE PHOSPHATASE, ELEVATED 03/15/2009  . Anxiety and depression 04/28/2011  . Vaginitis 05/22/2011  . Baker's cyst of knee 05/22/2011  . Trauma 10/18/2011  . Radial neck fracture 10/2011    minimally displaced  . Atypical chest pain 11/30/2011  . Folliculitis of nose 5/88/5027  . Esophageal ring   . 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 Urinary incontinence 03/19/2012  . Allergic state 06/10/2012  . Hematuria   . Dysphagia   . URI (upper respiratory infection) 09/02/2012  . Preventative health care 10/21/2012  . Dermatitis 11/23/2012  . Fall 11/23/2012  . Hx of echocardiogram     a. Echo 01/2103: mild LVH, EF 55-60%, normal wall motion, Gr 1 diast dysfn  . Knee pain, bilateral 07/23/2011  . Elevated sed rate 08/02/2013    ALLERGIES:  is allergic to codeine; diphenhydramine hcl; morphine; peanut-containing drug products; and zofran.  MEDICATIONS:  Current Outpatient Prescriptions  Medication Sig Dispense Refill  . amitriptyline (ELAVIL) 150 MG tablet take 1 tablet by mouth at bedtime  30 tablet  2  . aspirin 81 MG tablet Take 81 mg by mouth daily.        Anne [name] atorvastatin (LIPITOR) 40 MG tablet Take 1 tablet (40 mg total) by mouth daily.  30 tablet  0  . Cholecalciferol (VITAMIN D3) 1000 UNITS CAPS Take 2 tablets by mouth daily.        . fexofenadine (ALLEGRA) 180 MG  tablet 1 tab po daily x 2 weeks then as needed      . ibuprofen (ADVIL,MOTRIN) 800 MG tablet Take 800 mg by mouth as needed.      Anne [name] LORazepam (ATIVAN) 1 MG tablet Take 1 tablet (1 mg total) by mouth every 8 (eight) hours as needed for anxiety or sleep.  70 tablet  0  . pantoprazole (PROTONIX) 40 MG tablet Take 1 tablet (40 mg total) by mouth 2 (two) times daily before a meal. 30-60 minutes before breakfast and supper  180 tablet  0  . Probiotic Product (PROBIOTIC FORMULA PO) Take 1 capsule by mouth daily.      Anne [name] triamcinolone (KENALOG) 0.025 % ointment Apply  1 application topically 2 (two) times daily as needed.  80 g  1  . vitamin B-12 (CYANOCOBALAMIN) 1000 MCG tablet Take 1,000 mcg by mouth daily.        Anne [name] anastrozole (ARIMIDEX) 1 MG tablet Take 1 tablet (1 mg total) by mouth daily.  90 tablet  3   No current facility-administered medications for this visit.    SURGICAL HISTORY:  Past Surgical History  Procedure Laterality Date  . Anterior cruciate ligament repair  08, 09, 10  . Ercp, cbd stone extraction  2010  . Cholecystectomy  2010  . Egd/ dili  2010, 2012  . Breast surgery  2001    lumpectomy (01)  . Colonoscopy  09/05/10    [facility]  . Esophagogastroduodenoscopy  01/08/2012    Procedure: ESOPHAGOGASTRODUODENOSCOPY (EGD);  Surgeon: Gatha Mayer, MD;  Location: [facility];  Service: Endoscopy;  Laterality: N/A;  . Savory dilation  01/08/2012    Procedure: SAVORY DILATION;  Surgeon: Gatha Mayer, MD;  Location: [facility];  Service: Endoscopy;  Laterality: N/A;  need xray  . Mastectomy modified radical      , Mastectomy modified radical (08), breast reconstruction, CA lesions excised lateral abd wall 2010  . Breast reconstructive surgery  2008, 2009, 2010    REVIEW OF SYSTEMS:  Pertinent items are noted in HPI.   HEALTH MAINTENANCE:  PHYSICAL EXAMINATION: Blood pressure 125/81, pulse 90, temperature 98.2 F (36.8 C), temperature source Oral, resp. rate 18, height 5\' 7"  (1.702 m), weight 135 lb 8 oz (61.462 kg). Body mass index is 21.22 kg/(m^2). ECOG PERFORMANCE STATUS: 1 - Symptomatic but completely ambulatory   General appearance: alert, cooperative and appears stated age Lymph nodes: Cervical, supraclavicular, and axillary nodes normal. Resp: clear to auscultation bilaterally Cardio: regular rate and rhythm GI: soft, non-tender; bowel sounds normal; no masses,  no organomegaly Extremities: extremities normal, atraumatic, no cyanosis or edema Neurologic: Grossly normal   LABORATORY DATA: Lab Results   Component Value Date   WBC 6.2 02/01/2014   HGB 12.0 02/01/2014   HCT 36.5 02/01/2014   MCV 95.4 02/01/2014   PLT 262 02/01/2014      Chemistry      Component Value Date/Time   NA 142 02/01/2014 1158   NA 140 05/20/2013 1526   K 3.8 02/01/2014 1158   K 3.7 05/20/2013 1526   CL 100 05/20/2013 1526   CL 102 11/28/2012 1043   CO2 28 02/01/2014 1158   CO2 28 05/20/2013 1526   BUN 18.2 02/01/2014 1158   BUN 13 05/20/2013 1526   CREATININE 0.8 02/01/2014 1158   CREATININE 0.70 05/20/2013 1526   CREATININE 0.71 09/12/2011 1559      Component Value Date/Time   CALCIUM 9.5 02/01/2014 1158   CALCIUM 10.0  05/20/2013 1526   ALKPHOS 90 02/01/2014 1158   ALKPHOS 105 01/13/2013 1210   AST 17 02/01/2014 1158   AST 18 01/13/2013 1210   ALT 20 02/01/2014 1158   ALT 16 01/13/2013 1210   BILITOT 0.44 02/01/2014 1158   BILITOT 0.6 01/13/2013 1210      *RADIOLOGY REPORT*  Clinical Data: Breast cancer staging  CT CHEST, ABDOMEN AND PELVIS WITH CONTRAST  Technique: Multidetector CT imaging of the chest, abdomen and  pelvis was performed following the standard protocol during bolus  administration of intravenous contrast.  Contrast: 165mL OMNIPAQUE IOHEXOL 300 MG/ML SOLN  Comparison: PET CT 05/14/2012  CT CHEST  Findings: Right subglandular breast implant. No axillary  lymphadenopathy. There are right axillary lymphadenectomy clips.  No supraclavicular lymphadenopathy. A small paratracheal lymph  node on the right measures 6 mm (image #6) and is unchanged from  recent PET CT scan. No mediastinal adenopathy. No pericardial  fluid. Esophagus is normal.  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 There is a fibrotic thickening in  the right upper lobe which likely relates to radiation therapy.  There is nodular pleural thickening at the bilateral lung bases  which appears benign. No suspicious pulmonary nodules.  IMPRESSION:  1. No evidence of breast cancer recurrence.  2.  Small high right paratracheal lymph node is unchanged.  3. Post mastectomy with radiation change in the right upper lobe.  CT ABDOMEN AND PELVIS  Findings: No focal hepatic lesion. The gallbladder, pancreas,  spleen, adrenal glands, and kidneys are normal.  The stomach, small bowel, appendix, and cecum are normal. The  colon and rectosigmoid colon are normal.  Abdominal aorta normal caliber. No retroperitoneal periportal  lymphadenopathy. There is no free fluid the pelvis. Uterus and  bladder normal. No pelvic lymphadenopathy. Review of bone windows  demonstrates no aggressive osseous lesions.  IMPRESSION:  1. No evidence of metastasis in the abdomen or pelvis.  Original Report Authenticated By: Suzy Bouchard, M.D.          RADIOGRAPHIC STUDIES:  Dg Sacrum/coccyx  05/29/2013   *RADIOLOGY REPORT*  Clinical Data: Fall.  Coccydynia.  SACRUM AND COCCYX - 2+ VIEW  Comparison:  None.  Findings: There is no coccygeal dislocation.  There is a subtle lucency across the distal sacrum (arrow) seen on the lateral view, which could represent a nondisplaced fracture.  Correlate clinically.  IMPRESSION: Possible nondisplaced distal sacral fracture.   Original Report Authenticated By: Rolla Flatten, M.D.    ASSESSMENT: 73 year old female with  #1 original diagnosis of breast cancer in the left breast in 2001 she received lumpectomy followed by adjuvant chemotherapy consisting of CMF.  #2 and 2008 patient had recurrent disease she declined surgery until July 2008. She then went on to have radiation with Xeloda. She has been on Arimidex 1 mg daily. So far tolerating it well.   PLAN:   #1 no evidence of metastatic disease to chest/abdomen/pelvis continue Arimidex       Mammogram scheduled 02/15/2014  #2 anemia resolved  #3  Osteopenia      Take calcium 600 mg 2 daily and vit D 2000 IU daily       Bone Density due 09/2014        Follow up in 6 months CBC,CMP,Vit D level today pending   All  questions were answered. The patient knows to call the clinic with any problems, questions or concerns. We can certainly see the patient much sooner  if necessary.  I spent 25 minutes counseling the patient face to face. The total time spent in the appointment was 30 minutes.  Amada Kingfisher, M.D. Oncology/Hematology [place] [phone] (Office)    02/02/2014, 8:58 AM

## 2014-02-04 ENCOUNTER — Ambulatory Visit (HOSPITAL_BASED_OUTPATIENT_CLINIC_OR_DEPARTMENT_OTHER)
Admission: RE | Admit: 2014-02-04 | Discharge: 2014-02-04 | Disposition: A | Discharge status: Home / Self Care | Payer: Medicare HMO | Source: Ambulatory Visit | Attending: Family Medicine | Admitting: Family Medicine

## 2014-02-04 ENCOUNTER — Ambulatory Visit (INDEPENDENT_AMBULATORY_CARE_PROVIDER_SITE_OTHER): Payer: Commercial Managed Care - HMO | Admitting: Family Medicine

## 2014-02-04 ENCOUNTER — Encounter: Payer: Self-pay | Admitting: Family Medicine

## 2014-02-04 VITALS — BP 102/70 | HR 92 | Temp 98.9°F | Ht 67.0 in | Wt 135.0 lb

## 2014-02-04 DIAGNOSIS — Z9010 Acquired absence of unspecified breast and nipple: Secondary | ICD-10-CM | POA: Insufficient documentation

## 2014-02-04 DIAGNOSIS — R071 Chest pain on breathing: Secondary | ICD-10-CM

## 2014-02-04 DIAGNOSIS — M1710 Unilateral primary osteoarthritis, unspecified knee: Secondary | ICD-10-CM

## 2014-02-04 DIAGNOSIS — R0789 Other chest pain: Secondary | ICD-10-CM

## 2014-02-04 DIAGNOSIS — M79609 Pain in unspecified limb: Secondary | ICD-10-CM | POA: Insufficient documentation

## 2014-02-04 NOTE — Progress Notes (Signed)
Patient ID: Anne [name], female   DOB: 1941-09-04, 73 y.o.   MRN: [id number] Anne [name] [id number] 1941-07-05 02/04/2014      Progress Note-Follow Up  Subjective  Chief Complaint  Chief Complaint  Patient presents with  . Flank Pain    pain on Right side under arm X off and on for awhile     HPI  Patient is a 73 year old female in today for routine medical care. She is with her response to her treatment for her knee. Her pain is improved. Today however she's complaining of some pain in her right chest wall. Along the axillary line. She describes it as a mild discomfort which comes and goes. There's been no skin changes warmth or erythema. No other chest pain palpitations or shortness of breath. No fatigue, malaise or myalgias. Denies CP/palp/SOB/HA/congestion/fevers/GI or GU c/o. Taking meds as prescribed  Past Medical History  Diagnosis Date  . Anemia   . COPD (chronic obstructive pulmonary disease)   . Hyperlipidemia   . Pneumonia 2-10    pleurisy  . Arthritis   . Depression   . GERD (gastroesophageal reflux disease) 09/29/2009    improved s/p cholecystectomy and esophagus dilatation  . History of chicken pox   . History of shingles     2 episodes  . History of measles   . Osteopenia 03/14/2011  . 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 Pure hypercholesterolemia 10/17/2010  . 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 ESOPHAGEAL STRICTURE 03/29/2009  . CAROTID ARTERY STENOSIS 01/13/2010  . ALKALINE PHOSPHATASE, ELEVATED 03/15/2009  . Anxiety and depression 04/28/2011  . Vaginitis 05/22/2011  . Baker's cyst of knee 05/22/2011  . Trauma 10/18/2011  . Radial neck fracture 10/2011    minimally displaced  . Atypical chest pain 11/30/2011  . Folliculitis of nose 0/86/5784  . Esophageal ring   . 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 Urinary incontinence 03/19/2012  . Allergic state 06/10/2012  . Hematuria   . Dysphagia   . URI (upper  respiratory infection) 09/02/2012  . Preventative health care 10/21/2012  . Dermatitis 11/23/2012  . Fall 11/23/2012  . Hx of echocardiogram     a. Echo 01/2103: mild LVH, EF 55-60%, normal wall motion, Gr 1 diast dysfn  . Knee pain, bilateral 07/23/2011  . Elevated sed rate 08/02/2013    Past Surgical History  Procedure Laterality Date  . Anterior cruciate ligament repair  08, 09, 10  . Ercp, cbd stone extraction  2010  . Cholecystectomy  2010  . Egd/ dili  2010, 2012  . Breast surgery  2001    lumpectomy (01)  . Colonoscopy  09/05/10    [facility]  . Esophagogastroduodenoscopy  01/08/2012    Procedure: ESOPHAGOGASTRODUODENOSCOPY (EGD);  Surgeon: Gatha Mayer, MD;  Location: [facility];  Service: Endoscopy;  Laterality: N/A;  . Savory dilation  01/08/2012    Procedure: SAVORY DILATION;  Surgeon: Gatha Mayer, MD;  Location: [facility];  Service: Endoscopy;  Laterality: N/A;  need xray  . Mastectomy modified radical      , Mastectomy modified radical (08), breast reconstruction, CA lesions excised lateral abd wall 2010  . Breast reconstructive surgery  2008, 2009, 2010    Family History  Problem Relation Age of Onset  . Heart disease Father   . Lung cancer Father     smoker  . Cirrhosis Sister  Primary Biliary  . Breast cancer    . Stroke Maternal Grandmother   . Alcohol abuse Maternal Grandfather   . Heart disease Paternal Grandfather   . Anxiety disorder Sister   . Osteoporosis Sister   . Arthritis Sister     Rheumatoid  . Skin cancer Sister   . Osteoporosis Sister   . Cancer Sister     multiple skin cancers, over 86 excisions.  . Other Mother     tic deleauroux  . Anesthesia problems Neg Hx   . Hypotension Neg Hx   . Malignant hyperthermia Neg Hx   . Pseudochol deficiency Neg Hx   . Colon cancer Neg Hx   . Ovarian cancer    . Pancreatic cancer      History   Social History  . Marital Status: Married    Spouse Name: N/A    Number of Children: N/A   . Years of Education: N/A   Occupational History  . Not on file.   Social History Main Topics  . Smoking status: Former Smoker    Types: Cigarettes    Quit date: 05/22/2007  . Smokeless tobacco: Never Used  . Alcohol Use: 0.6 oz/week    1 Glasses of wine per week     Comment: 6 oz daily  . Drug Use: No  . Sexual Activity: Yes    Partners: Male   Other Topics Concern  . Not on file   Social History Narrative  . No narrative on file    Current Outpatient Prescriptions on File Prior to Visit  Medication Sig Dispense Refill  . amitriptyline (ELAVIL) 150 MG tablet take 1 tablet by mouth at bedtime  30 tablet  2  . anastrozole (ARIMIDEX) 1 MG tablet Take 1 tablet (1 mg total) by mouth daily.  90 tablet  3  . aspirin 81 MG tablet Take 81 mg by mouth daily.        [name] atorvastatin (LIPITOR) 40 MG tablet Take 1 tablet (40 mg total) by mouth daily.  30 tablet  0  . Cholecalciferol (VITAMIN D3) 1000 UNITS CAPS Take 2 tablets by mouth daily.        . fexofenadine (ALLEGRA) 180 MG tablet 1 tab po daily x 2 weeks then as needed      . ibuprofen (ADVIL,MOTRIN) 800 MG tablet Take 800 mg by mouth as needed.      [name] LORazepam (ATIVAN) 1 MG tablet take 1 tablet by mouth every 8 hours if needed  70 tablet  0  . pantoprazole (PROTONIX) 40 MG tablet Take 1 tablet (40 mg total) by mouth 2 (two) times daily before a meal. 30-60 minutes before breakfast and supper  180 tablet  0  . Probiotic Product (PROBIOTIC FORMULA PO) Take 1 capsule by mouth daily.      [name] triamcinolone (KENALOG) 0.025 % ointment Apply 1 application topically 2 (two) times daily as needed.  80 g  1  . vitamin B-12 (CYANOCOBALAMIN) 1000 MCG tablet Take 1,000 mcg by mouth daily.         No current facility-administered medications on file prior to visit.    Allergies  Allergen Reactions  . Codeine Other (See Comments)    "flu like symptoms"  . Diphenhydramine Hcl Other (See Comments)    "nervous and upset"  . Morphine Other  (See Comments)    "flu like symptoms"  . Peanut-Containing Drug Products Hives  . Zofran Other (See Comments)    HEADACHE  Review of Systems  Review of Systems  Constitutional: Negative for fever, chills and malaise/fatigue.  HENT: Negative for congestion, hearing loss and nosebleeds.   Eyes: Negative for discharge.  Respiratory: Negative for cough, sputum production, shortness of breath and wheezing.   Cardiovascular: Positive for chest pain. Negative for palpitations and leg swelling.  Gastrointestinal: Negative for heartburn, nausea, vomiting, abdominal pain, diarrhea, constipation and blood in stool.  Genitourinary: Negative for dysuria, urgency, frequency and hematuria.  Musculoskeletal: Negative for back pain, falls and myalgias.  Skin: Negative for rash.  Neurological: Negative for dizziness, tremors, sensory change, focal weakness, loss of consciousness, weakness and headaches.  Endo/Heme/Allergies: Negative for polydipsia. Does not bruise/bleed easily.  Psychiatric/Behavioral: Negative for depression and suicidal ideas. The patient is not nervous/anxious and does not have insomnia.     Objective  BP 102/70  Pulse 92  Temp(Src) 98.9 F (37.2 C) (Oral)  Ht 5\' 7"  (1.702 m)  Wt 135 lb (61.236 kg)  BMI 21.14 kg/m2  SpO2 96%  Physical Exam  Physical Exam  Constitutional: She is oriented to person, place, and time and well-developed, well-nourished, and in no distress. No distress.  HENT:  Head: Normocephalic and atraumatic.  Eyes: Conjunctivae are normal.  Neck: Neck supple. No thyromegaly present.  Cardiovascular: Normal rate, regular rhythm and normal heart sounds.   No murmur heard. Scar on right  Chest wall, tender to palp but no swelling or erythema. No obvious lesions  Pulmonary/Chest: Effort normal and breath sounds normal. She has no wheezes.  Abdominal: She exhibits no distension and no mass.  Musculoskeletal: She exhibits no edema.  Lymphadenopathy:     She has no cervical adenopathy.  Neurological: She is alert and oriented to person, place, and time.  Skin: Skin is warm and dry. No rash noted. She is not diaphoretic.  Psychiatric: Memory, affect and judgment normal.    Lab Results  Component Value Date   TSH 2.326 09/18/2013   Lab Results  Component Value Date   WBC 6.2 02/01/2014   HGB 12.0 02/01/2014   HCT 36.5 02/01/2014   MCV 95.4 02/01/2014   PLT 262 02/01/2014   Lab Results  Component Value Date   CREATININE 0.8 02/01/2014   BUN 18.2 02/01/2014   NA 142 02/01/2014   K 3.8 02/01/2014   CL 100 05/20/2013   CO2 28 02/01/2014   Lab Results  Component Value Date   ALT 20 02/01/2014   AST 17 02/01/2014   ALKPHOS 90 02/01/2014   BILITOT 0.44 02/01/2014   Lab Results  Component Value Date   CHOL 201* 01/13/2013   Lab Results  Component Value Date   HDL 65.70 01/13/2013   Lab Results  Component Value Date   LDLCALC 89 10/21/2012   Lab Results  Component Value Date   TRIG 158.0* 01/13/2013   Lab Results  Component Value Date   CHOLHDL 3 01/13/2013     Assessment & Plan  Primary localized osteoarthrosis, lower leg Improved nicely with recent treatment by Sports Med  Chest wall pain CXR unremarkable, discomfort is mild and intermittent. No deformity noted. Report worsening pain. Try topical treatments for now

## 2014-02-04 NOTE — Progress Notes (Signed)
Pre visit review using our clinic review tool, if applicable. No additional management support is needed unless otherwise documented below in the visit note. 

## 2014-02-04 NOTE — Patient Instructions (Signed)
Costochondritis Costochondritis, sometimes called Tietze syndrome, is a swelling and irritation (inflammation) of the tissue (cartilage) that connects your ribs with your breastbone (sternum). It causes pain in the chest and rib area. Costochondritis usually goes away on its own over time. It can take up to 6 weeks or longer to get better, especially if you are unable to limit your activities. CAUSES  Some cases of costochondritis have no known cause. Possible causes include:  Injury (trauma).  Exercise or activity such as lifting.  Severe coughing. SIGNS AND SYMPTOMS  Pain and tenderness in the chest and rib area.  Pain that gets worse when coughing or taking deep breaths.  Pain that gets worse with specific movements. DIAGNOSIS  Your health care provider will do a physical exam and ask about your symptoms. Chest X-rays or other tests may be done to rule out other problems. TREATMENT  Costochondritis usually goes away on its own over time. Your health care provider may prescribe medicine to help relieve pain. HOME CARE INSTRUCTIONS   Avoid exhausting physical activity. Try not to strain your ribs during normal activity. This would include any activities using chest, abdominal, and side muscles, especially if heavy weights are used.  Apply ice to the affected area for the first 2 days after the pain begins.  Put ice in a plastic bag.  Place a towel between your skin and the bag.  Leave the ice on for 20 minutes, 2 3 times a day.  Only take over-the-counter or prescription medicines as directed by your health care provider. SEEK MEDICAL CARE IF:  You have redness or swelling at the rib joints. These are signs of infection.  Your pain does not go away despite rest or medicine. SEEK IMMEDIATE MEDICAL CARE IF:   Your pain increases or you are very uncomfortable.  You have shortness of breath or difficulty breathing.  You cough up blood.  You have worse chest pains,  sweating, or vomiting.  You have a fever or persistent symptoms for more than 2 3 days.  You have a fever and your symptoms suddenly get worse. MAKE SURE YOU:   Understand these instructions.  Will watch your condition.  Will get help right away if you are not doing well or get worse. Document Released: 08/08/2005 Document Revised: 08/19/2013 Document Reviewed: 06/02/2013 [employer] Patient Information 2014 [employer].  

## 2014-02-07 ENCOUNTER — Encounter: Payer: Self-pay | Admitting: Family Medicine

## 2014-02-07 DIAGNOSIS — R0789 Other chest pain: Secondary | ICD-10-CM | POA: Insufficient documentation

## 2014-02-07 NOTE — Assessment & Plan Note (Signed)
CXR unremarkable, discomfort is mild and intermittent. No deformity noted. Report worsening pain. Try topical treatments for now

## 2014-02-07 NOTE — Assessment & Plan Note (Signed)
Improved nicely with recent treatment by Sports Med

## 2014-02-10 ENCOUNTER — Ambulatory Visit: Payer: Commercial Managed Care - HMO | Admitting: Family Medicine

## 2014-02-15 ENCOUNTER — Ambulatory Visit
Admission: RE | Admit: 2014-02-15 | Discharge: 2014-02-15 | Disposition: A | Discharge status: Home / Self Care | Payer: Commercial Managed Care - HMO | Source: Ambulatory Visit | Attending: Adult Health | Admitting: Adult Health

## 2014-02-15 ENCOUNTER — Encounter: Payer: Self-pay | Admitting: *Deleted

## 2014-02-15 DIAGNOSIS — Z853 Personal history of malignant neoplasm of breast: Secondary | ICD-10-CM

## 2014-02-15 NOTE — Progress Notes (Signed)
This RN faxed an order for a bone density exam to [facility] mammography.

## 2014-02-23 NOTE — Progress Notes (Signed)
Rcvd report from [facility] bone density report dtd 02/15/14.  Provided to [facility].

## 2014-03-02 ENCOUNTER — Other Ambulatory Visit: Payer: Self-pay | Admitting: *Deleted

## 2014-03-02 ENCOUNTER — Telehealth: Payer: Self-pay | Admitting: Internal Medicine

## 2014-03-02 MED ORDER — ATORVASTATIN CALCIUM 40 MG PO TABS: 40.0000 mg | ORAL_TABLET | Freq: Every day | ORAL | Status: DC

## 2014-03-02 NOTE — Telephone Encounter (Signed)
Informed Sam that we need to see her.  He said she will have to call me back to set up appointment.  Once she does we will send medicine to cover her until her appointment.

## 2014-03-03 ENCOUNTER — Other Ambulatory Visit (HOSPITAL_COMMUNITY): Payer: Self-pay | Admitting: *Deleted

## 2014-03-03 DIAGNOSIS — I6529 Occlusion and stenosis of unspecified carotid artery: Secondary | ICD-10-CM

## 2014-03-04 NOTE — Telephone Encounter (Signed)
LM on her VM to call and set up an appointment so we can send in her medicine to cover until she comes.

## 2014-03-09 MED ORDER — PANTOPRAZOLE SODIUM 40 MG PO TBEC: 40.0000 mg | DELAYED_RELEASE_TABLET | Freq: Two times a day (BID) | ORAL | Status: DC

## 2014-03-09 NOTE — Telephone Encounter (Signed)
Patient informed that rx sent [facility] of [facility] per her request and to please keep June appointment.

## 2014-03-10 ENCOUNTER — Other Ambulatory Visit: Payer: Self-pay | Admitting: Family Medicine

## 2014-03-11 ENCOUNTER — Encounter (HOSPITAL_COMMUNITY): Payer: Commercial Managed Care - HMO

## 2014-03-11 NOTE — Telephone Encounter (Signed)
RX printed for md to sign and fax  Last RX was done on 02-02-14 quantity 70 with 0 refills

## 2014-03-12 ENCOUNTER — Ambulatory Visit (HOSPITAL_COMMUNITY): Payer: Medicare HMO | Attending: Internal Medicine | Admitting: Cardiology

## 2014-03-12 DIAGNOSIS — I6529 Occlusion and stenosis of unspecified carotid artery: Secondary | ICD-10-CM | POA: Insufficient documentation

## 2014-03-12 DIAGNOSIS — I658 Occlusion and stenosis of other precerebral arteries: Secondary | ICD-10-CM | POA: Insufficient documentation

## 2014-03-12 DIAGNOSIS — E785 Hyperlipidemia, unspecified: Secondary | ICD-10-CM | POA: Insufficient documentation

## 2014-03-12 DIAGNOSIS — Z87891 Personal history of nicotine dependence: Secondary | ICD-10-CM | POA: Insufficient documentation

## 2014-03-12 DIAGNOSIS — J449 Chronic obstructive pulmonary disease, unspecified: Secondary | ICD-10-CM | POA: Insufficient documentation

## 2014-03-12 DIAGNOSIS — J4489 Other specified chronic obstructive pulmonary disease: Secondary | ICD-10-CM | POA: Insufficient documentation

## 2014-03-12 NOTE — Progress Notes (Signed)
Carotid duplex performed 

## 2014-03-16 ENCOUNTER — Encounter: Payer: Self-pay | Admitting: Nurse Practitioner

## 2014-03-16 ENCOUNTER — Ambulatory Visit (INDEPENDENT_AMBULATORY_CARE_PROVIDER_SITE_OTHER): Payer: Commercial Managed Care - HMO | Admitting: Nurse Practitioner

## 2014-03-16 VITALS — BP 105/72 | HR 94 | Temp 98.6°F | Ht 67.0 in | Wt 134.2 lb

## 2014-03-16 DIAGNOSIS — I809 Phlebitis and thrombophlebitis of unspecified site: Secondary | ICD-10-CM | POA: Insufficient documentation

## 2014-03-16 DIAGNOSIS — L255 Unspecified contact dermatitis due to plants, except food: Secondary | ICD-10-CM

## 2014-03-16 MED ORDER — TRIAMCINOLONE ACETONIDE 0.5 % EX OINT: 1.0000 "application " | TOPICAL_OINTMENT | Freq: Two times a day (BID) | CUTANEOUS | Status: DC

## 2014-03-16 NOTE — Progress Notes (Signed)
   Subjective:    Patient ID: Anne [name], female    DOB: October 03, 1941, 73 y.o.   MRN: [id number]  HPI Comments: Pt also c/o nodule on leg. Noticed it 1 week ago. Not painful. Thinks she hit leg.  Poison Anne [name] This is a new problem. The current episode started yesterday. The problem has been gradually worsening since onset. The affected locations include the right arm. The rash is characterized by blistering, itchiness, swelling and redness. She was exposed to plant contact. Pertinent negatives include no fatigue, fever or shortness of breath. Past treatments include nothing.      Review of Systems  Constitutional: Negative for fever, chills, activity change, appetite change and fatigue.  Respiratory: Negative for shortness of breath.   Cardiovascular: Negative for chest pain and palpitations.  Genitourinary:       Diarrhea for few days-woke last night.  Skin: Positive for rash (R forearm).       Nodule L LE       Objective:   Physical Exam  Vitals reviewed. Constitutional: She is oriented to person, place, and time. She appears well-developed and well-nourished. No distress.  HENT:  Head: Normocephalic and atraumatic.  Eyes: Conjunctivae are normal. Right eye exhibits no discharge. Left eye exhibits no discharge.  Cardiovascular: Normal rate.   Pulmonary/Chest: Effort normal. No respiratory distress.  Neurological: She is alert and oriented to person, place, and time.  Skin: Rash noted.  R forearm: blisters in linear pattern on red base. Surrounded by nml skin. 1 cm nodule L LE, firm, appears as bruise, NT, moveable.  Psychiatric: She has a normal mood and affect. Her behavior is normal. Thought content normal.          Assessment & Plan:  1. Plant dermatitis - triamcinolone ointment (KENALOG) 0.5 %; Apply 1 application topically 2 (two) times daily.  Dispense: 30 g; Refill: 0  2. Superficial thrombophlebitis Watch & wait. Warm compresses. See pt instructions.  3.  Diarrhea 2 days. Don't think r/t rash.

## 2014-03-16 NOTE — Progress Notes (Signed)
Pre visit review using our clinic review tool, if applicable. No additional management support is needed unless otherwise documented below in the visit note. 

## 2014-03-16 NOTE — Patient Instructions (Signed)
Apply steroid cream on rash twice daily for at least 14 days. Rash should start improving within a few days, but it is not likley to clear completely for 2 weeks. Wash rash daily with Dove bar soap. Pat dry. You may use ice pack for itching. Watch lump on leg-I think it is a superficial blood clot. It may take 2 mos. To resolve. If it becomes painful, red, warm or you have a streak up leg, please call us. You may apply warm compresses (sock with dry rice) to area several times daily to help with clot resolution.

## 2014-03-18 ENCOUNTER — Telehealth: Payer: Self-pay | Admitting: Family Medicine

## 2014-03-18 NOTE — Telephone Encounter (Signed)
Patient husband called in stating that patient has poison ivy on the same arm that her lymph nodes were taken out. He wants to know if Dr. Charlett Blake could work her in sometime today? He states that Dr. Charlett Blake has told him that he could always call and she will try to work them in.

## 2014-03-18 NOTE — Telephone Encounter (Signed)
So unfortunately I have a meeting downtown at 5:30 so I cannot stay late. I am willing to send her in a course of meds to help manage or I could meet them here early at 7:30 in the morning. Does she have mostly itching and rash, any fevers, bodyaches etc. Any open or oozing wounds?

## 2014-03-18 NOTE — Telephone Encounter (Signed)
I called and informed pt and she stated that was to early. Patient states she saw Layne today and was given a cream but she wanted Dr Charlett Blake to confirm what it is.

## 2014-03-18 NOTE — Telephone Encounter (Signed)
Pt also stated she would wait through the weekend and see how it goes.

## 2014-03-18 NOTE — Telephone Encounter (Signed)
Please advise 

## 2014-03-22 ENCOUNTER — Ambulatory Visit: Payer: Medicare HMO | Admitting: Family Medicine

## 2014-03-25 ENCOUNTER — Encounter: Payer: Self-pay | Admitting: Family Medicine

## 2014-03-26 ENCOUNTER — Encounter: Payer: Self-pay | Admitting: Family Medicine

## 2014-03-27 ENCOUNTER — Ambulatory Visit: Payer: Medicare HMO | Admitting: Family Medicine

## 2014-03-29 ENCOUNTER — Telehealth: Payer: Self-pay | Admitting: Internal Medicine

## 2014-03-29 NOTE — Telephone Encounter (Signed)
Patient states that she is having a "build up of saliva"  "it just comes out".  She is able to swallow other liquids and was able to eat soup for lunch.  She is asked to try and drink water and she was able to swallow without difficulty.  She is concerned about not swallowing saliva.  She is offered an appt with the APP she declines.  She is scheduled for an office visit for 04/15/14 at 8:30 with Dr. [name].  She is asked to see her primary care until or call back if she changes her mind to see APP

## 2014-03-30 NOTE — Telephone Encounter (Signed)
If she is having dysphagia - please requestion - she could need another EGD/dili and I could do that with [facility] Fri afternoon (early) if spot available

## 2014-03-30 NOTE — Telephone Encounter (Signed)
Left message for patient to call back  

## 2014-03-31 ENCOUNTER — Encounter: Payer: Self-pay | Admitting: Family Medicine

## 2014-03-31 ENCOUNTER — Ambulatory Visit: Payer: Medicare HMO | Admitting: Physician Assistant

## 2014-03-31 ENCOUNTER — Ambulatory Visit (INDEPENDENT_AMBULATORY_CARE_PROVIDER_SITE_OTHER): Payer: Medicare HMO | Admitting: Family Medicine

## 2014-03-31 VITALS — BP 96/68 | HR 95 | Temp 98.2°F | Ht 67.0 in | Wt 134.0 lb

## 2014-03-31 DIAGNOSIS — T148 Other injury of unspecified body region: Secondary | ICD-10-CM

## 2014-03-31 DIAGNOSIS — W57XXXA Bitten or stung by nonvenomous insect and other nonvenomous arthropods, initial encounter: Secondary | ICD-10-CM

## 2014-03-31 DIAGNOSIS — B882 Other arthropod infestations: Secondary | ICD-10-CM

## 2014-03-31 DIAGNOSIS — A94 Unspecified arthropod-borne viral fever: Secondary | ICD-10-CM

## 2014-03-31 NOTE — Telephone Encounter (Signed)
Left message for patient to call back  

## 2014-03-31 NOTE — Progress Notes (Signed)
Pre visit review using our clinic review tool, if applicable. No additional management support is needed unless otherwise documented below in the visit note. 

## 2014-03-31 NOTE — Progress Notes (Addendum)
OFFICE NOTE  03/31/2014  CC:  Chief Complaint  Patient presents with  . Tick Removal     HPI: Patient is a 73 y.o. Caucasian female who is here accompanied by her husband for tick bite to back of right hip. Reports recent tick bite, then got HA, joints ached, had a rash around tick bite site and it was painful there-caused her to limp.  Saw a provider at an UC and was started on doxycycline 5 d/a and she is feeling much improved.  Joint pain gone, no HAs.  Area around the bite is feeling much less painful.  Still has not had any fever or rash other than that around the bite site.  Pertinent PMH:  Past medical, surgical, social, and family history reviewed and no changes are noted since last office visit.  MEDS:  Outpatient Prescriptions Prior to Visit  Medication Sig Dispense Refill  . amitriptyline (ELAVIL) 150 MG tablet take 1 tablet by mouth at bedtime  30 tablet  2  . aspirin 81 MG tablet Take 81 mg by mouth daily.        [name] atorvastatin (LIPITOR) 40 MG tablet Take 1 tablet (40 mg total) by mouth daily.  90 tablet  3  . Cholecalciferol (VITAMIN D3) 1000 UNITS CAPS Take 2 tablets by mouth daily.        . fexofenadine (ALLEGRA) 180 MG tablet 1 tab po daily x 2 weeks then as needed      . ibuprofen (ADVIL,MOTRIN) 800 MG tablet Take 800 mg by mouth as needed.      . Loratadine (CLARITIN) 10 MG CAPS Take by mouth. OTC      . LORazepam (ATIVAN) 1 MG tablet take 1 tablet every 8 hours if needed  70 tablet  0  . pantoprazole (PROTONIX) 40 MG tablet Take 1 tablet (40 mg total) by mouth 2 (two) times daily before a meal. 30-60 minutes before breakfast and supper  180 tablet  0  . Probiotic Product (PROBIOTIC FORMULA PO) Take 1 capsule by mouth daily.      [name] triamcinolone ointment (KENALOG) 0.5 % Apply 1 application topically 2 (two) times daily.  30 g  0  . vitamin B-12 (CYANOCOBALAMIN) 1000 MCG tablet Take 1,000 mcg by mouth daily.        [name] triamcinolone (KENALOG) 0.025 % ointment Apply 1  application topically 2 (two) times daily as needed.  80 g  1   No facility-administered medications prior to visit.    PE: Blood pressure 96/68, pulse 95, temperature 98.2 F (36.8 C), temperature source Temporal, height 5\' 7"  (1.702 m), weight 134 lb (60.782 kg), SpO2 95.00%. Gen: Alert, well appearing.  Patient is oriented to person, place, time, and situation. VZD:GLOV: no injection, icteris, swelling, or exudate.  EOMI, PERRLA. Mouth: lips without lesion/swelling.  Oral mucosa pink and moist. Oropharynx without erythema, exudate, or swelling.  CV: RRR, no m/r/g.   LUNGS: CTA bilat, nonlabored resps, good aeration in all lung fields. ABD: soft, NT EXT: no clubbing, cyanosis, or edema.  SKIN: posterior aspect of left hip with splotchy/irregular-shaped area of ecchymoses, nontender. No central clearing.  No warmth, no fluctuance, no nodularity.  IMPRESSION AND PLAN:  Tick-borne illness: resolving on doxycycline 100mg  bid. Complete this antibiotic. Discussed tick bite prevention/avoidance measures in future. Signs/symptoms to call or return for were reviewed and pt expressed understanding.  An After Visit Summary was printed and given to the patient.  FOLLOW UP: prn

## 2014-04-01 ENCOUNTER — Telehealth: Payer: Self-pay | Admitting: Internal Medicine

## 2014-04-01 ENCOUNTER — Emergency Department (HOSPITAL_COMMUNITY)
Admission: EM | Admit: 2014-04-01 | Discharge: 2014-04-01 | Disposition: A | Discharge status: Home / Self Care | Payer: Medicare HMO | Attending: Emergency Medicine | Admitting: Emergency Medicine

## 2014-04-01 ENCOUNTER — Encounter: Payer: Self-pay | Admitting: *Deleted

## 2014-04-01 DIAGNOSIS — Z8619 Personal history of other infectious and parasitic diseases: Secondary | ICD-10-CM | POA: Insufficient documentation

## 2014-04-01 DIAGNOSIS — Z872 Personal history of diseases of the skin and subcutaneous tissue: Secondary | ICD-10-CM | POA: Insufficient documentation

## 2014-04-01 DIAGNOSIS — IMO0002 Reserved for concepts with insufficient information to code with codable children: Secondary | ICD-10-CM | POA: Insufficient documentation

## 2014-04-01 DIAGNOSIS — K219 Gastro-esophageal reflux disease without esophagitis: Secondary | ICD-10-CM | POA: Insufficient documentation

## 2014-04-01 DIAGNOSIS — J449 Chronic obstructive pulmonary disease, unspecified: Secondary | ICD-10-CM | POA: Insufficient documentation

## 2014-04-01 DIAGNOSIS — Z87891 Personal history of nicotine dependence: Secondary | ICD-10-CM | POA: Insufficient documentation

## 2014-04-01 DIAGNOSIS — Z862 Personal history of diseases of the blood and blood-forming organs and certain disorders involving the immune mechanism: Secondary | ICD-10-CM | POA: Insufficient documentation

## 2014-04-01 DIAGNOSIS — Z79899 Other long term (current) drug therapy: Secondary | ICD-10-CM | POA: Insufficient documentation

## 2014-04-01 DIAGNOSIS — Z8701 Personal history of pneumonia (recurrent): Secondary | ICD-10-CM | POA: Insufficient documentation

## 2014-04-01 DIAGNOSIS — Z792 Long term (current) use of antibiotics: Secondary | ICD-10-CM | POA: Insufficient documentation

## 2014-04-01 DIAGNOSIS — F411 Generalized anxiety disorder: Secondary | ICD-10-CM | POA: Insufficient documentation

## 2014-04-01 DIAGNOSIS — Z7982 Long term (current) use of aspirin: Secondary | ICD-10-CM | POA: Insufficient documentation

## 2014-04-01 DIAGNOSIS — Z8742 Personal history of other diseases of the female genital tract: Secondary | ICD-10-CM | POA: Insufficient documentation

## 2014-04-01 DIAGNOSIS — E7800 Pure hypercholesterolemia, unspecified: Secondary | ICD-10-CM | POA: Insufficient documentation

## 2014-04-01 DIAGNOSIS — R110 Nausea: Secondary | ICD-10-CM | POA: Insufficient documentation

## 2014-04-01 DIAGNOSIS — F3289 Other specified depressive episodes: Secondary | ICD-10-CM | POA: Insufficient documentation

## 2014-04-01 DIAGNOSIS — M129 Arthropathy, unspecified: Secondary | ICD-10-CM | POA: Insufficient documentation

## 2014-04-01 DIAGNOSIS — R1310 Dysphagia, unspecified: Secondary | ICD-10-CM

## 2014-04-01 DIAGNOSIS — J4489 Other specified chronic obstructive pulmonary disease: Secondary | ICD-10-CM | POA: Insufficient documentation

## 2014-04-01 DIAGNOSIS — F329 Major depressive disorder, single episode, unspecified: Secondary | ICD-10-CM | POA: Insufficient documentation

## 2014-04-01 DIAGNOSIS — Z853 Personal history of malignant neoplasm of breast: Secondary | ICD-10-CM | POA: Insufficient documentation

## 2014-04-01 DIAGNOSIS — K200 Eosinophilic esophagitis: Secondary | ICD-10-CM | POA: Insufficient documentation

## 2014-04-01 DIAGNOSIS — E785 Hyperlipidemia, unspecified: Secondary | ICD-10-CM | POA: Insufficient documentation

## 2014-04-01 DIAGNOSIS — Z8781 Personal history of (healed) traumatic fracture: Secondary | ICD-10-CM | POA: Insufficient documentation

## 2014-04-01 HISTORY — DX: Eosinophilic esophagitis: K20.0

## 2014-04-01 MED ORDER — PANTOPRAZOLE SODIUM 40 MG PO TBEC
40.0000 mg | DELAYED_RELEASE_TABLET | Freq: Every day | ORAL | Status: DC
  Administered 2014-04-01: 40 mg via ORAL
  Filled 2014-04-01: qty 1

## 2014-04-01 NOTE — ED Provider Notes (Signed)
CSN: [id number]     Arrival date & time 04/01/14  1531 History   First MD Initiated Contact with Patient 04/01/14 1539     Chief Complaint  Patient presents with  . Dysphagia     (Consider location/radiation/quality/duration/timing/severity/associated sxs/prior Treatment) Patient is a 73 y.o. female presenting with general illness. The history is provided by the patient.  Illness Location:  Esophagus Quality:  Difficulty swallowing Severity:  Moderate Onset quality:  Gradual Duration:  5 days Timing:  Constant Progression:  Worsening Chronicity:  Recurrent Context:  Hx of esophageal strictures - has had dilations in the past Relieved by:  Nothing Worsened by:  Nothing Ineffective treatments:  None tried Associated symptoms: nausea   Associated symptoms: no chest pain, no cough, no diarrhea, no fever and no vomiting     Past Medical History  Diagnosis Date  . Anemia   . COPD (chronic obstructive pulmonary disease)   . Hyperlipidemia   . Pneumonia 2-10    pleurisy  . Arthritis   . Depression   . GERD (gastroesophageal reflux disease) 09/29/2009    improved s/p cholecystectomy and esophagus dilatation  . History of chicken pox   . History of shingles     2 episodes  . History of measles   . Osteopenia 03/14/2011  . 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 Pure hypercholesterolemia 10/17/2010  . 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 ESOPHAGEAL STRICTURE 03/29/2009  . CAROTID ARTERY STENOSIS 01/13/2010  . ALKALINE PHOSPHATASE, ELEVATED 03/15/2009  . Anxiety and depression 04/28/2011  . Vaginitis 05/22/2011  . Baker's cyst of knee 05/22/2011  . Trauma 10/18/2011  . Radial neck fracture 10/2011    minimally displaced  . Atypical chest pain 11/30/2011  . Folliculitis of nose 2/53/6644  . Esophageal ring   . 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 Urinary incontinence 03/19/2012  . Allergic state 06/10/2012  . Hematuria   . Dysphagia   .  URI (upper respiratory infection) 09/02/2012  . Preventative health care 10/21/2012  . Dermatitis 11/23/2012  . Fall 11/23/2012  . Hx of echocardiogram     a. Echo 01/2103: mild LVH, EF 55-60%, normal wall motion, Gr 1 diast dysfn  . Knee pain, bilateral 07/23/2011  . Elevated sed rate 08/02/2013  . EE (eosinophilic esophagitis)    Past Surgical History  Procedure Laterality Date  . Anterior cruciate ligament repair  08, 09, 10  . Ercp, cbd stone extraction  2010  . Cholecystectomy  2010  . Egd/ dili  2010, 2012  . Breast surgery  2001    lumpectomy (01)  . Colonoscopy  09/05/10    [facility]  . Esophagogastroduodenoscopy  01/08/2012    Procedure: ESOPHAGOGASTRODUODENOSCOPY (EGD);  Surgeon: Gatha Mayer, MD;  Location: [facility];  Service: Endoscopy;  Laterality: N/A;  . Savory dilation  01/08/2012    Procedure: SAVORY DILATION;  Surgeon: Gatha Mayer, MD;  Location: [facility];  Service: Endoscopy;  Laterality: N/A;  need xray  . Mastectomy modified radical      , Mastectomy modified radical (08), breast reconstruction, CA lesions excised lateral abd wall 2010  . Breast reconstructive surgery  2008, 2009, 2010   Family History  Problem Relation Age of Onset  . Heart disease Father   . Lung cancer Father     smoker  . Cirrhosis Sister     Primary Biliary  . Breast cancer    .  Stroke Maternal Grandmother   . Alcohol abuse Maternal Grandfather   . Heart disease Paternal Grandfather   . Anxiety disorder Sister   . Osteoporosis Sister   . Arthritis Sister     Rheumatoid  . Skin cancer Sister   . Osteoporosis Sister   . Cancer Sister     multiple skin cancers, over 49 excisions.  . Other Mother     tic deleauroux  . Anesthesia problems Neg Hx   . Hypotension Neg Hx   . Malignant hyperthermia Neg Hx   . Pseudochol deficiency Neg Hx   . Colon cancer Neg Hx   . Ovarian cancer    . Pancreatic cancer     History  Substance Use Topics  . Smoking status: Former  Smoker    Types: Cigarettes    Quit date: 05/22/2007  . Smokeless tobacco: Never Used  . Alcohol Use: 0.6 oz/week    1 Glasses of wine per week     Comment: 6 oz daily   OB History   Grav Para Term Preterm Abortions TAB SAB Ect Mult Living                 Review of Systems  Constitutional: Negative for fever.  Respiratory: Negative for cough.   Cardiovascular: Negative for chest pain.  Gastrointestinal: Positive for nausea. Negative for vomiting and diarrhea.  All other systems reviewed and are negative.     Allergies  Codeine; Diphenhydramine hcl; Morphine; Peanut-containing drug products; and Zofran  Home Medications   Prior to Admission medications   Medication Sig Start Date End Date Taking? Authorizing Provider  amitriptyline (ELAVIL) 150 MG tablet take 1 tablet by mouth at bedtime   Yes [name], MD  aspirin 81 MG tablet Take 81 mg by mouth daily.     Yes Historical Provider, MD  atorvastatin (LIPITOR) 40 MG tablet Take 1 tablet (40 mg total) by mouth daily. 03/02/14  Yes Fay Records, MD  Cholecalciferol (VITAMIN D3) 1000 UNITS CAPS Take 2 tablets by mouth daily.     Yes Historical Provider, MD  doxycycline (VIBRAMYCIN) 100 MG capsule Take 100 mg by mouth 2 (two) times daily.   Yes Historical Provider, MD  ibuprofen (ADVIL,MOTRIN) 200 MG tablet Take 400 mg by mouth every 6 (six) hours as needed for mild pain.   Yes Historical Provider, MD  loratadine (CLARITIN) 10 MG tablet Take 10 mg by mouth daily.   Yes Historical Provider, MD  LORazepam (ATIVAN) 1 MG tablet take 1 tablet every 8 hours if needed   Yes [name], MD  pantoprazole (PROTONIX) 40 MG tablet Take 1 tablet (40 mg total) by mouth 2 (two) times daily before a meal. 30-60 minutes before breakfast and supper 03/09/14  Yes Gatha Mayer, MD  Probiotic Product (PROBIOTIC FORMULA PO) Take 1 capsule by mouth daily.   Yes Historical Provider, MD  vitamin B-12 (CYANOCOBALAMIN) 1000 MCG tablet Take 1,000 mcg  by mouth daily.     Yes Historical Provider, MD  triamcinolone ointment (KENALOG) 0.5 % Apply 1 application topically 2 (two) times daily. 03/16/14   Irene Pap, NP   BP 114/83  Pulse 104  Temp(Src) 99.3 F (37.4 C) (Oral)  Resp 20  SpO2 97% Physical Exam  Nursing note and vitals reviewed. Constitutional: She is oriented to person, place, and time. She appears well-developed and well-nourished. No distress.  HENT:  Head: Normocephalic and atraumatic.  Mouth/Throat: Oropharynx is clear and moist.  Eyes: EOM are normal. Pupils are equal, round, and reactive to light.  Neck: Normal range of motion. Neck supple.  Cardiovascular: Normal rate and regular rhythm.  Exam reveals no friction rub.   No murmur heard. Pulmonary/Chest: Effort normal and breath sounds normal. No respiratory distress. She has no wheezes. She has no rales.  Abdominal: Soft. She exhibits no distension. There is no tenderness. There is no rebound.  Musculoskeletal: Normal range of motion. She exhibits no edema.  Neurological: She is alert and oriented to person, place, and time.  Skin: No rash noted. She is not diaphoretic.    ED Course  Procedures (including critical care time) Labs Review Labs Reviewed - No data to display  Imaging Review No results found.   EKG Interpretation None      MDM   Final diagnoses:  Dysphagia  Eosinophilic esophagitis    11H presents with difficulty swallowing. Hx of similar - 3 esophageal dilations in the past. Progressively worsening over the past 5 days. No fevers, no vomiting. Mild nausea, epigastric pain today. Moist mucus membranes, no signs of dehydration. States she cannot eat or drink anything today. Here she was able to swallow water, she is not spitting out her secretions. I will speak with GI.   I spoke with Dr. Hilarie Fredrickson with GI. He states she will need treatment for her eosinophilic esophagitis first to help with inflammation, as they may exacerbate esophageal  strictures. He recommended PPI (which she's on) and steroids - either budesonide slurry or fluticasone spray that is swallowed. Patient states she's been off her PPI for 1 week and had stopped her fluticasone a while ago because it made her mouth sore.  Instructed to resume these meds (she has them at home) and to f/u with GI in the am as scheduled. Instructed to do liquids/pureed foods until then. Stable for discharge.  Osvaldo Shipper, MD 04/01/14 [phone]

## 2014-04-01 NOTE — Telephone Encounter (Signed)
Patient presented to the [facility] long ER today for worsening dysphagia. ER physician, Dr. Mingo Amber, called for advice. Patient is not in distress and does not have apparent food impaction. He is able to tolerate water without choking or coughing. She is tolerating secretions Chart reviewed, history of eosinophilic esophagitis and stricture Has appointment with Alonza Bogus, PA-C tomorrow I recommend she remain on PPI and begin treatment for eosinophilic esophagitis with budesonide slurry 2 mg per 10 mL.  Dose is 1 mg (5 mL) twice daily x4 weeks At appointment tomorrow Alonza Bogus, PA-C can discuss repeat dilation with her primary gastroenterologist Dr. [name]

## 2014-04-01 NOTE — Telephone Encounter (Signed)
Pt states she is having a hard time swallowing again. States this started a couple of days ago. Pt states she has had her esophagus stretched before and she thinks she may need to have this again. Pt scheduled to see Alonza Bogus PA tomorrow at 9:30am. Pt aware.

## 2014-04-01 NOTE — Telephone Encounter (Signed)
Patient left a voicemail that she was feeling better.  I have left her another message to keep the appt for 04/15/14.  I have also asked that she call back for any additional questions or concerns

## 2014-04-01 NOTE — ED Notes (Signed)
Pt presents with c/o difficulty swallowing. Pt says that this started about 5 days ago and it has gotten progressively worse. Pt says that she is unable to eat or drink anything today. Pt has a hx of same problem, has had her esophagus stretched several times in the past. Pt is in NAD and is talking in complete sentences at this time.

## 2014-04-01 NOTE — Discharge Instructions (Signed)
Dysphagia  Swallowing problems (dysphagia) occur when solids and liquids seem to stick in your throat on the way down to your stomach, or the food takes longer to get to the stomach. Other symptoms include regurgitating food, noises coming from the throat, chest discomfort with swallowing, and a feeling of fullness or the feeling of something being stuck in your throat when swallowing. When blockage in your throat is complete it may be associated with drooling.  CAUSES   Problems with swallowing may occur because of problems with the muscles. The food cannot be propelled in the usual manner into your stomach. You may have ulcers, scar tissue, or inflammation in the tube down which food travels from your mouth to your stomach (esophagus), which blocks food from passing normally into the stomach. Causes of inflammation include:  · Acid reflux from your stomach into your esophagus.  · Infection.  · Radiation treatment for cancer.  · Medicines taken without enough fluids to wash them down into your stomach.  You may have nerve problems that prevent signals from being sent to the muscles of your esophagus to contract and move your food down to your stomach. Globus pharyngeus is a relatively common problem in which there is a sense of an obstruction or difficulty in swallowing, without any physical abnormalities of the swallowing passages being found. This problem usually improves over time with reassurance and testing to rule out other causes.  DIAGNOSIS  Dysphagia can be diagnosed and its cause can be determined by tests in which you swallow a white substance that helps illuminate the inside of your throat (contrast medium) while X-rays are taken. Sometimes a flexible telescope that is inserted down your throat (endoscopy) to look at your esophagus and stomach is used.  TREATMENT   · If the dysphagia is caused by acid reflux or infection, medicines may be used.  · If the dysphagia is caused by problems with your  swallowing muscles, swallowing therapy may be used to help you strengthen your swallowing muscles.  · If the dysphagia is caused by a blockage or mass, procedures to remove the blockage may be done.  HOME CARE INSTRUCTIONS  · Try to eat soft food that is easier to swallow and check your weight on a daily basis to be sure that it is not decreasing.  · Be sure to drink liquids when sitting upright (not lying down).  SEEK MEDICAL CARE IF:  · You are losing weight because you are unable to swallow.  · You are coughing when you drink liquids (aspiration).  · You are coughing up partially digested food.  SEEK IMMEDIATE MEDICAL CARE IF:  · You are unable to swallow your own saliva .  · You are having shortness of breath or a fever, or both.  · You have a hoarse voice along with difficulty swallowing.  MAKE SURE YOU:  · Understand these instructions.  · Will watch your condition.  · Will get help right away if you are not doing well or get worse.  Document Released: 10/26/2000 Document Revised: 07/01/2013 Document Reviewed: 04/17/2013  [employer]® Patient Information ©2014 [employer].

## 2014-04-02 ENCOUNTER — Telehealth: Payer: Self-pay | Admitting: Family Medicine

## 2014-04-02 ENCOUNTER — Encounter: Payer: Self-pay | Admitting: Gastroenterology

## 2014-04-02 ENCOUNTER — Ambulatory Visit (INDEPENDENT_AMBULATORY_CARE_PROVIDER_SITE_OTHER): Payer: Commercial Managed Care - HMO | Admitting: Gastroenterology

## 2014-04-02 ENCOUNTER — Other Ambulatory Visit: Payer: Self-pay | Admitting: Family Medicine

## 2014-04-02 VITALS — BP 88/66 | HR 100 | Ht 67.0 in | Wt 133.2 lb

## 2014-04-02 DIAGNOSIS — K200 Eosinophilic esophagitis: Secondary | ICD-10-CM

## 2014-04-02 DIAGNOSIS — R1319 Other dysphagia: Secondary | ICD-10-CM | POA: Insufficient documentation

## 2014-04-02 DIAGNOSIS — R1310 Dysphagia, unspecified: Secondary | ICD-10-CM

## 2014-04-02 DIAGNOSIS — Z8719 Personal history of other diseases of the digestive system: Secondary | ICD-10-CM

## 2014-04-02 NOTE — Patient Instructions (Signed)

## 2014-04-02 NOTE — Progress Notes (Signed)
04/02/2014 Anne [name] [id number] April 18, 1941   History of Present Illness:  This is a 73 year old female who is known to Dr. [name] for long-standing issues with eosinophilic esophagitis. Her last EGD was in January 2014 at which time she was found to have eosinophilic esophagitis with multiple rings and strictures, which were dilated.  She's currently on pantoprazole 40 mg twice daily (had stopped it for about a week or so), but had discontinued her fluticasone inhaler for some time.  She says that the dysphagia had been quite frequent recently and has been worsening again. She was in the emergency department yesterday after eating a bite of yogurt that felt like it got stuck in her esophagus. Dr. Hilarie Fredrickson was contacted on call by the emergency room physician last night. The patient was in no distress and was tolerating secretions; food bolus was not suspected.  It was recommended that she continue her twice daily PPI therapy and to begin budesonide slurry with followup appointment today to discuss repeat endoscopy. The patient states that she has not given the budesonide slurry because she still had a new fluticasone inhaler at home so she restarted that last night instead. After being in the emergency department she tolerated a smoothie and some soup from [place]. She comes in today with the impression that she would have endoscopy performed today.   Current Medications, Allergies, Past Medical History, Past Surgical History, Family History and Social History were reviewed in [employer] record.   Physical Exam: BP 88/66  Pulse 100  Ht 5\' 7"  (1.702 m)  Wt 133 lb 3.2 oz (60.419 kg)  BMI 20.86 kg/m2 General: Well developed white female in no acute distress Head: Normocephalic and atraumatic Eyes:  Sclerae anicteric, conjunctiva pink  Ears: Normal auditory acuity Lungs: Clear throughout to auscultation Heart: Regular rate and rhythm Abdomen: Soft, non-distended.  Normal  bowel sounds.  Non-tender. Musculoskeletal: Symmetrical with no gross deformities  Extremities: No edema  Neurological: Alert oriented x 4, grossly non-focal Psychological:  Alert and cooperative. Normal mood and affect  Assessment and Recommendations: -Dysphagia:  History of EoE and esophageal strictures.  Is on pantoprazole 40 mg BID and will continue that.  Also, she just restarted her fluticasone inhaler last night, but was advised that she needs to continue this with two puffs twice daily as she was directed in the past by Dr. [name].  Budesonide slurry is too expensive as this was considered in the past.  She was under the impression that she was getting an EGD today.  Will schedule EGD with possible dilation with Dr. [name].  The risks, benefits, and alternatives were discussed with the patient and she consents to proceed.   **Note, she is asking for small bowel biopsy to be performed during the procedure to rule out celiac disease.

## 2014-04-02 NOTE — Progress Notes (Signed)
Agree w/ Ms. Zehr 

## 2014-04-02 NOTE — Telephone Encounter (Signed)
Requesting referral to Dr [name] DX [place]  [facility] referral

## 2014-04-06 ENCOUNTER — Encounter: Payer: Self-pay | Admitting: Internal Medicine

## 2014-04-06 ENCOUNTER — Ambulatory Visit (AMBULATORY_SURGERY_CENTER): Payer: Medicare HMO | Admitting: Internal Medicine

## 2014-04-06 VITALS — BP 133/73 | HR 74 | Temp 97.3°F | Resp 18 | Ht 67.0 in | Wt 133.0 lb

## 2014-04-06 DIAGNOSIS — K200 Eosinophilic esophagitis: Secondary | ICD-10-CM

## 2014-04-06 DIAGNOSIS — R1319 Other dysphagia: Secondary | ICD-10-CM

## 2014-04-06 DIAGNOSIS — K222 Esophageal obstruction: Secondary | ICD-10-CM

## 2014-04-06 MED ORDER — SODIUM CHLORIDE 0.9 % IV SOLN: 500.0000 mL | INTRAVENOUS | Status: DC

## 2014-04-06 MED ORDER — AMBULATORY NON FORMULARY MEDICATION: 1.0000 mg | Freq: Two times a day (BID) | Status: DC

## 2014-04-06 NOTE — Progress Notes (Signed)
Called to room to assist during endoscopic procedure.  Patient ID and intended procedure confirmed with present staff. Received instructions for my participation in the procedure from the performing physician.  

## 2014-04-06 NOTE — Patient Instructions (Addendum)
I dilated the esophagus again which should help.  I have prescribed budesonide slurry - please take that prescription to [facility] and have it filled and start it.  I think another dilation will be necessary to make the swallowing even better and would like to schedule you for a repeat endoscopy and dilation for next month.  I appreciate the opportunity to care for you. Gatha Mayer, MD, FACG  YOU HAD AN ENDOSCOPIC PROCEDURE TODAY AT [facility]: Refer to the procedure report that was given to you for any specific questions about what was found during the examination.  If the procedure report does not answer your questions, please call your gastroenterologist to clarify.  If you requested that your care partner not be given the details of your procedure findings, then the procedure report has been included in a sealed envelope for you to review at your convenience later.  YOU SHOULD EXPECT: Some feelings of bloating in the abdomen. Passage of more gas than usual.  Walking can help get rid of the air that was put into your GI tract during the procedure and reduce the bloating. If you had a lower endoscopy (such as a colonoscopy or flexible sigmoidoscopy) you may notice spotting of blood in your stool or on the toilet paper. If you underwent a bowel prep for your procedure, then you may not have a normal bowel movement for a few days.  DIET:   You may have clear liquids until 11:30 am.  Eat a soft diet for the rest of today.   You may eat what you want tomorrow.Drink plenty of fluids but you should avoid alcoholic beverages for 24 hours.  ACTIVITY: Your care partner should take you home directly after the procedure.  You should plan to take it easy, moving slowly for the rest of the day.  You can resume normal activity the day after the procedure however you should NOT DRIVE or use heavy machinery for 24 hours (because of the sedation medicines used during the test).     SYMPTOMS TO REPORT IMMEDIATELY: A gastroenterologist can be reached at any hour.  During normal business hours, 8:30 AM to 5:00 PM Monday through Friday, call [phone].  After hours and on weekends, please call the GI answering service at [phone] who will take a message and have the physician on call contact you.   Following upper endoscopy (EGD)  Vomiting of blood or coffee ground material  New chest pain or pain under the shoulder blades  Painful or persistently difficult swallowing  New shortness of breath  Fever of 100F or higher  Black, tarry-looking stools  FOLLOW UP: If any biopsies were taken you will be contacted by phone or by letter within the next 1-3 weeks.  Call your gastroenterologist if you have not heard about the biopsies in 3 weeks.  Our staff will call the home number listed on your records the next business day following your procedure to check on you and address any questions or concerns that you may have at that time regarding the information given to you following your procedure. This is a courtesy call and so if there is no answer at the home number and we have not heard from you through the emergency physician on call, we will assume that you have returned to your regular daily activities without incident.  SIGNATURES/CONFIDENTIALITY: You and/or your care partner have signed paperwork which will be entered into your electronic medical record.  These signatures attest to the fact that that the information above on your After Visit Summary has been reviewed and is understood.  Full responsibility of the confidentiality of this discharge information lies with you and/or your care-partner.  Take your flurry as directed.

## 2014-04-06 NOTE — Op Note (Addendum)
[place]  [address] [place], [address]   ENDOSCOPY PROCEDURE REPORT  PATIENT: Anne [name], Anne [name]  MR#: [id number] BIRTHDATE: Apr 05, 1941 , 72  yrs. old GENDER: Female ENDOSCOPIST: Gatha Mayer, MD, [facility] PROCEDURE DATE:  04/06/2014 PROCEDURE:  Esophagoscopy and Maloney dilation of esophagus ASA CLASS:     Class III INDICATIONS:  Therapeutic procedure. MEDICATIONS: propofol (Diprivan) 100mg  IV, MAC sedation, administered by CRNA, and These medications were titrated to patient response per physician's verbal order TOPICAL ANESTHETIC: Cetacaine Spray  DESCRIPTION OF PROCEDURE: After the risks benefits and alternatives of the procedure were thoroughly explained, informed consent was obtained.  The LB [id number] endoscope was introduced through the mouth and advanced to the stomach body. Without limitations.  The instrument was slowly withdrawn as the mucosa was fully examined.        ESOPHAGUS: Multiple rings consistent with eosinophilic esophagitis. Scope would not pass throgh GE junction at first and was removed. 5 Fr maloney dilation performed and scope re-inserted.  Some heme on dilator and in esophagus - scope passed into proximal stomach. Retroflexed views revealed no abnormalities.     The scope was then withdrawn from the patient and the procedure completed.  COMPLICATIONS: There were no complications. ENDOSCOPIC IMPRESSION: Multiple rings consistent with eosinophilic esophagitis.  Scope would not pass throgh GE junction at first and was removed.  54 Fr maloney dilation performed and scope re-inserted.  Some heme on dilator and in esophagus - scope passed into proximal stomach.  RECOMMENDATIONS: 1.  Clear liquids until 1130, then soft foods rest of day.  Resume prior diet tomorrow. 2.  Endoscopy - repeat in 2-4 weeks with repeat dilation in [place] - schedule today 3.  Budesonide slurry 1 mg bid - Rx printed to take to [facility] - this replaces ingested fluticasone    eSigned:  Gatha Mayer, MD, [facility] 04/06/2014 10:24 AM Revised: 04/06/2014 10:24 AM  CC:The Patient

## 2014-04-06 NOTE — Progress Notes (Signed)
Patient stated that she wanted an earlier appointment than available in June.  She settled on 06/08/2014 at 0800 after much discussion.   She wanted an 11:00am, and it wasn't avilable.

## 2014-04-07 ENCOUNTER — Telehealth: Payer: Self-pay | Admitting: *Deleted

## 2014-04-07 NOTE — Telephone Encounter (Signed)
No answer, message left for the patient. 

## 2014-04-09 ENCOUNTER — Telehealth: Payer: Self-pay | Admitting: *Deleted

## 2014-04-09 NOTE — Telephone Encounter (Signed)
Spoke with patient and she has had a recent endoscopy (her 4th).  She will follow up with Dr. [name] on 04/15/14.  Patient states she has been seeing him for years and this does not have anything to do with her hx of breast cancer.

## 2014-04-09 NOTE — Telephone Encounter (Signed)
Pt called upset that her compression sleeve has not been covered by her insurance.  I attempted to explain to her that [employer] direct bills.  She became belligerent, I advised pt I was ending the call.

## 2014-04-10 ENCOUNTER — Encounter: Payer: Self-pay | Admitting: Family Medicine

## 2014-04-13 ENCOUNTER — Encounter: Payer: Self-pay | Admitting: Internal Medicine

## 2014-04-14 ENCOUNTER — Other Ambulatory Visit: Payer: Self-pay | Admitting: Family Medicine

## 2014-04-14 ENCOUNTER — Telehealth: Payer: Self-pay | Admitting: *Deleted

## 2014-04-14 NOTE — Telephone Encounter (Signed)
Notified pt of receipt of endoscopy report & informed to f/u with oncologist appt in Oct.  She was upset about Dr. Humphrey Rolls leaving & states she wants to see a female doctor.  Directed her to call Medical Director to discuss.

## 2014-04-15 ENCOUNTER — Ambulatory Visit: Payer: Medicare HMO | Admitting: Internal Medicine

## 2014-04-15 NOTE — Telephone Encounter (Signed)
rx printed for md to sign and fax  Last rx was done on 03-12-14 quantity 70 with 0 refills

## 2014-05-03 ENCOUNTER — Ambulatory Visit: Payer: Commercial Managed Care - HMO | Admitting: Internal Medicine

## 2014-05-11 ENCOUNTER — Ambulatory Visit: Payer: Commercial Managed Care - HMO | Admitting: Family Medicine

## 2014-05-22 ENCOUNTER — Other Ambulatory Visit: Payer: Self-pay | Admitting: Family Medicine

## 2014-05-22 DIAGNOSIS — F411 Generalized anxiety disorder: Secondary | ICD-10-CM

## 2014-05-24 NOTE — Telephone Encounter (Signed)
OK to refill.  Rx Printed, signed and ready to be faxed.

## 2014-05-24 NOTE — Telephone Encounter (Signed)
Please advise refill? Last RX was done on 04-15-14 quantity 70 with 0 refills

## 2014-05-24 NOTE — Telephone Encounter (Signed)
Rx request faxed to pharmacy/[facility]  

## 2014-05-31 ENCOUNTER — Emergency Department
Admission: EM | Admit: 2014-05-31 | Discharge: 2014-05-31 | Disposition: A | Discharge status: Home / Self Care | Payer: Commercial Managed Care - HMO | Source: Home / Self Care | Attending: Emergency Medicine | Admitting: Emergency Medicine

## 2014-05-31 ENCOUNTER — Encounter: Payer: Self-pay | Admitting: Emergency Medicine

## 2014-05-31 ENCOUNTER — Ambulatory Visit (AMBULATORY_SURGERY_CENTER): Payer: Commercial Managed Care - HMO | Admitting: *Deleted

## 2014-05-31 VITALS — Ht 67.0 in | Wt 129.2 lb

## 2014-05-31 DIAGNOSIS — L02419 Cutaneous abscess of limb, unspecified: Secondary | ICD-10-CM

## 2014-05-31 DIAGNOSIS — K222 Esophageal obstruction: Secondary | ICD-10-CM

## 2014-05-31 DIAGNOSIS — W57XXXA Bitten or stung by nonvenomous insect and other nonvenomous arthropods, initial encounter: Principal | ICD-10-CM

## 2014-05-31 DIAGNOSIS — L03119 Cellulitis of unspecified part of limb: Secondary | ICD-10-CM

## 2014-05-31 DIAGNOSIS — T63481A Toxic effect of venom of other arthropod, accidental (unintentional), initial encounter: Secondary | ICD-10-CM

## 2014-05-31 MED ORDER — CETIRIZINE HCL 10 MG PO TABS: 10.0000 mg | ORAL_TABLET | Freq: Every day | ORAL | Status: DC

## 2014-05-31 MED ORDER — METHYLPREDNISOLONE ACETATE 80 MG/ML IJ SUSP
80.0000 mg | Freq: Once | INTRAMUSCULAR | Status: AC
  Administered 2014-05-31: 80 mg via INTRAMUSCULAR

## 2014-05-31 MED ORDER — SULFAMETHOXAZOLE-TRIMETHOPRIM 200-40 MG/5ML PO SUSP: 20.0000 mL | Freq: Two times a day (BID) | ORAL | Status: DC

## 2014-05-31 MED ORDER — TRIAMCINOLONE ACETONIDE 0.1 % EX CREA: TOPICAL_CREAM | CUTANEOUS | Status: DC

## 2014-05-31 NOTE — ED Provider Notes (Signed)
CSN: [id number]     Arrival date & time 05/31/14  1731 History   First MD Initiated Contact with Patient 05/31/14 1741     Chief Complaint  Patient presents with  . Insect Bite   (Consider location/radiation/quality/duration/timing/severity/associated sxs/prior Treatment) The history is provided by the patient and the spouse.   Reports insect bites on legs, feet and back that have been progressively more annoying over past 2 days. This started when she was out in the yard, [place] in backyard 2 days ago. Multiple insect bites on legs and thighs and back. Very pruritic. Minimally sore. Some have just started to form discrete pustules especially several on each thigh. No drainage or bleeding. No new problems swallowing--history of esophageal stricture which has been stable. No nausea or vomiting. No fever or chills. Denies facial swelling. Denies chest pain or shortness of breath or breathing problems or wheezing. Denies syncope. Past Medical History  Diagnosis Date  . Anemia   . COPD (chronic obstructive pulmonary disease)   . Hyperlipidemia   . Pneumonia 2-10    pleurisy  . Arthritis   . Depression   . GERD (gastroesophageal reflux disease) 09/29/2009    improved s/p cholecystectomy and esophagus dilatation  . History of chicken pox   . History of shingles     2 episodes  . History of measles   . Osteopenia 03/14/2011  . 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 Pure hypercholesterolemia 10/17/2010  . 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 ESOPHAGEAL STRICTURE 03/29/2009  . CAROTID ARTERY STENOSIS 01/13/2010  . ALKALINE PHOSPHATASE, ELEVATED 03/15/2009  . Anxiety and depression 04/28/2011  . Vaginitis 05/22/2011  . Baker's cyst of knee 05/22/2011  . Trauma 10/18/2011  . Radial neck fracture 10/2011    minimally displaced  . Atypical chest pain 11/30/2011  . Folliculitis of nose 7/48/2707  . Esophageal ring   . 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 Urinary incontinence 03/19/2012  . Allergic state 06/10/2012  . Hematuria   . Dysphagia   . URI (upper respiratory infection) 09/02/2012  . Preventative health care 10/21/2012  . Dermatitis 11/23/2012  . Fall 11/23/2012  . Hx of echocardiogram     a. Echo 01/2103: mild LVH, EF 55-60%, normal wall motion, Gr 1 diast dysfn  . Knee pain, bilateral 07/23/2011  . Elevated sed rate 08/02/2013  . EE (eosinophilic esophagitis)    Past Surgical History  Procedure Laterality Date  . Anterior cruciate ligament repair  08, 09, 10  . Ercp, cbd stone extraction  2010  . Cholecystectomy  2010  . Egd/ dili  2010, 2012  . Breast surgery  2001    lumpectomy (01)  . Colonoscopy  09/05/10    cecal avm's  . Esophagogastroduodenoscopy  01/08/2012    Procedure: ESOPHAGOGASTRODUODENOSCOPY (EGD);  Surgeon: Gatha Mayer, MD;  Location: [facility];  Service: Endoscopy;  Laterality: N/A;  . Savory dilation  01/08/2012    Procedure: SAVORY DILATION;  Surgeon: Gatha Mayer, MD;  Location: [facility];  Service: Endoscopy;  Laterality: N/A;  need xray  . Mastectomy modified radical      , Mastectomy modified radical (08), breast reconstruction, CA lesions excised lateral abd wall 2010  . Breast reconstructive surgery  2008, 2009, 2010   Family History  Problem Relation Age of Onset  . Heart disease Father   . Lung cancer Father     smoker  . Cirrhosis  Sister     Primary Biliary  . Breast cancer Maternal Aunt     x 3  . Stroke Maternal Grandmother   . Alcohol abuse Maternal Grandfather   . Heart disease Paternal Grandfather   . Anxiety disorder Sister   . Osteoporosis Sister   . Arthritis Sister     Rheumatoid  . Osteoporosis Sister   . Cancer Sister     multiple skin cancers, over 45 excisions.  . Other Mother     tic deleauroux  . Anesthesia problems Neg Hx   . Hypotension Neg Hx   . Malignant hyperthermia Neg Hx   . Pseudochol deficiency Neg Hx   . Colon cancer Neg Hx   . Ovarian  cancer Neg Hx   . Pancreatic cancer Neg Hx   . Breast cancer Paternal Aunt    History  Substance Use Topics  . Smoking status: Former Smoker    Types: Cigarettes    Quit date: 05/22/2007  . Smokeless tobacco: Never Used  . Alcohol Use: 0.6 oz/week    1 Glasses of wine per week     Comment: at least 6 oz daily   OB History   Grav Para Term Preterm Abortions TAB SAB Ect Mult Living                 Review of Systems  All other systems reviewed and are negative.   Allergies  Codeine; Diphenhydramine hcl; Morphine; Peanut-containing drug products; Tomato; and Zofran  Home Medications   Prior to Admission medications   Medication Sig Start Date End Date Taking? Authorizing Provider  AMBULATORY NON FORMULARY MEDICATION Take 1 mg by mouth 2 (two) times daily. Medication Name: budesonide slurry 2mg /105ml 04/06/14   Gatha Mayer, MD  amitriptyline (ELAVIL) 150 MG tablet take 1 tablet by mouth at bedtime    [name], MD  aspirin 81 MG tablet Take 81 mg by mouth daily.      Historical Provider, MD  atorvastatin (LIPITOR) 40 MG tablet Take 1 tablet (40 mg total) by mouth daily. 03/02/14   Fay Records, MD  cetirizine (ZYRTEC) 10 MG tablet Take 1 tablet (10 mg total) by mouth daily. For itch 05/31/14   Jacqulyn Cane, MD  Cholecalciferol (VITAMIN D3) 1000 UNITS CAPS Take 2 tablets by mouth daily.      Historical Provider, MD  ibuprofen (ADVIL,MOTRIN) 200 MG tablet Take 400 mg by mouth every 6 (six) hours as needed for mild pain.    Historical Provider, MD  loratadine (CLARITIN) 10 MG tablet Take 10 mg by mouth daily.    Historical Provider, MD  LORazepam (ATIVAN) 1 MG tablet take 1 tablet every 8 hours if needed    Leeanne Rio, PA-C  pantoprazole (PROTONIX) 40 MG tablet Take 1 tablet (40 mg total) by mouth 2 (two) times daily before a meal. 30-60 minutes before breakfast and supper 03/09/14   Gatha Mayer, MD  Probiotic Product (PROBIOTIC FORMULA PO) Take 1 capsule by mouth  daily.    Historical Provider, MD  sulfamethoxazole-trimethoprim (BACTRIM,SEPTRA) 200-40 MG/5ML suspension Take 20 mLs by mouth 2 (two) times daily. X 7 days (antibiotic) 05/31/14   Jacqulyn Cane, MD  triamcinolone cream (KENALOG) 0.1 % Apply to affected areas 2 or 3 times daily as directed. 05/31/14   Jacqulyn Cane, MD  vitamin B-12 (CYANOCOBALAMIN) 1000 MCG tablet Take 1,000 mcg by mouth daily.      Historical Provider, MD   BP 105/71  Pulse  93  Temp(Src) 98.5 F (36.9 C) (Oral)  Resp 16  Ht 5\' 6"  (1.676 m)  Wt 129 lb (58.514 kg)  BMI 20.83 kg/m2  SpO2 97% Physical Exam  Nursing note and vitals reviewed. Constitutional: She is oriented to person, place, and time. She appears well-developed and well-nourished. No distress.  Very uncomfortable from multiple bug bites on legs and back.--She is alert and cooperative. No acute cardiorespiratory distress   HENT:  Head: Normocephalic and atraumatic.  Mouth/Throat: Oropharynx is clear and moist. No oropharyngeal exudate.  Eyes: Conjunctivae and EOM are normal. Pupils are equal, round, and reactive to light. No scleral icterus.  Neck: Normal range of motion. Neck supple. No JVD present.  Cardiovascular: Normal rate and regular rhythm.   Pulmonary/Chest: Effort normal and breath sounds normal. She has no wheezes.  Abdominal: She exhibits no distension.  Musculoskeletal: Normal range of motion.  No swollen or tender joints. No bony tenderness  Neurological: She is alert and oriented to person, place, and time. No cranial nerve deficit or sensory deficit.  Skin: Skin is warm. She is not diaphoretic.     Multiple red papules diffusely bilateral legs thighs ankles and feet. Several on trunk. Face and neck spared. Several papules on each thigh have formed a tiny yellow pustule without drainage. No fluctuance or induration or red streaks or drainage or bleeding.  Psychiatric: She has a normal mood and affect.    ED Course  Procedures (including  critical care time) Labs Review Labs Reviewed - No data to display  Imaging Review No results found.   MDM   1. Bites and stings, insect, accidental or unintentional, initial encounter   2. Cellulitis and abscess of leg    several of the bites have just started to become infected but no actual abscess  Treatment options discussed, as well as risks, benefits, alternatives. Patient and husband voiced understanding and agreement with the following plans: Depo-Medrol 80 mg IM stat. They declined any oral steroid. Triamcinolone cream 3 times a day She is allergic to oral Benadryl, but can take Zyrtec by mouth. Take one daily for antihistamine, when necessary itch Antibiotic choices discussed. I explained its important to cover staph. Husband stated that she did not tolerate doxycycline well in the past and prefers a different antibiotic. Prescribed Septra liquid (liquid at her request), 20 mL's by mouth twice a day x7-10 days. Other topical and hygiene measures discussed.  Follow-up with your primary care doctor in 5-7 days if not improving, or sooner if symptoms become worse. Precautions discussed. Red flags discussed. Questions invited and answered. Patient and husband voiced understanding and agreement.      Jacqulyn Cane, MD 05/31/14 [phone]

## 2014-05-31 NOTE — Progress Notes (Signed)
Patient denies any allergies to eggs or soy. Patient denies any problems with anesthesia/sedation. Patient denies any oxygen use at home and does not take any diet/weight loss medications. EMMI education assisgned to patient on EGD, this was explained and instructions given to patient. 

## 2014-05-31 NOTE — ED Notes (Signed)
Reports insect bites on legs, feet and back that have been progressively more annoying over past 2 days.

## 2014-06-02 ENCOUNTER — Other Ambulatory Visit: Payer: Self-pay | Admitting: Internal Medicine

## 2014-06-03 ENCOUNTER — Encounter: Payer: Self-pay | Admitting: Internal Medicine

## 2014-06-03 ENCOUNTER — Telehealth: Payer: Self-pay | Admitting: Internal Medicine

## 2014-06-03 NOTE — Telephone Encounter (Signed)
Pt is taking antibiotics for bug bites and wants to know if okay to proceed with procedure as scheduled.  Was seen at urgent Care on 7/20.  She says that bites are healing.  Told pt okay to proceed with procedure as scheduled.

## 2014-06-08 ENCOUNTER — Ambulatory Visit (AMBULATORY_SURGERY_CENTER): Payer: Commercial Managed Care - HMO | Admitting: Internal Medicine

## 2014-06-08 ENCOUNTER — Encounter: Payer: Self-pay | Admitting: Internal Medicine

## 2014-06-08 VITALS — BP 124/68 | HR 75 | Temp 96.6°F | Resp 14 | Ht 67.0 in | Wt 129.0 lb

## 2014-06-08 DIAGNOSIS — K200 Eosinophilic esophagitis: Secondary | ICD-10-CM

## 2014-06-08 DIAGNOSIS — R1319 Other dysphagia: Secondary | ICD-10-CM

## 2014-06-08 MED ORDER — AMBULATORY NON FORMULARY MEDICATION: 1.0000 mg | Freq: Two times a day (BID) | Status: DC

## 2014-06-08 MED ORDER — SODIUM CHLORIDE 0.9 % IV SOLN: 500.0000 mL | INTRAVENOUS | Status: DC

## 2014-06-08 NOTE — Progress Notes (Signed)
Called to room to assist during endoscopic procedure.  Patient ID and intended procedure confirmed with present staff. Received instructions for my participation in the procedure from the performing physician.  

## 2014-06-08 NOTE — Patient Instructions (Addendum)
I dilated the esophagus again - you will likely spit out some blood. Ease back into eating as we suggest.  We will get you an office visit to see me in about 6 weeks. My office will contact you to arrange.  Stay on the budesonide slurry from [place].  I appreciate the opportunity to care for you. Gatha Mayer, MD, FACG  YOU HAD AN ENDOSCOPIC PROCEDURE TODAY AT [facility]: Refer to the procedure report that was given to you for any specific questions about what was found during the examination.  If the procedure report does not answer your questions, please call your gastroenterologist to clarify.  If you requested that your care partner not be given the details of your procedure findings, then the procedure report has been included in a sealed envelope for you to review at your convenience later.  YOU SHOULD EXPECT: Some feelings of bloating in the abdomen. Passage of more gas than usual.  Walking can help get rid of the air that was put into your GI tract during the procedure and reduce the bloating. If you had a lower endoscopy (such as a colonoscopy or flexible sigmoidoscopy) you may notice spotting of blood in your stool or on the toilet paper. If you underwent a bowel prep for your procedure, then you may not have a normal bowel movement for a few days.  DIET: Your first meal following the procedure should be a light meal and then it is ok to progress to your normal diet.  A half-sandwich or bowl of soup is an example of a good first meal.  Heavy or fried foods are harder to digest and may make you feel nauseous or bloated.  Likewise meals heavy in dairy and vegetables can cause extra gas to form and this can also increase the bloating.  Drink plenty of fluids but you should avoid alcoholic beverages for 24 hours.  ACTIVITY: Your care partner should take you home directly after the procedure.  You should plan to take it easy, moving slowly for the rest of the day.  You can  resume normal activity the day after the procedure however you should NOT DRIVE or use heavy machinery for 24 hours (because of the sedation medicines used during the test).    SYMPTOMS TO REPORT IMMEDIATELY: A gastroenterologist can be reached at any hour.  During normal business hours, 8:30 AM to 5:00 PM Monday through Friday, call [phone].  After hours and on weekends, please call the GI answering service at [phone] who will take a message and have the physician on call contact you.   Following upper endoscopy (EGD)  Vomiting of blood or coffee ground material  New chest pain or pain under the shoulder blades  Painful or persistently difficult swallowing  New shortness of breath  Fever of 100F or higher  Black, tarry-looking stools  FOLLOW UP: If any biopsies were taken you will be contacted by phone or by letter within the next 1-3 weeks.  Call your gastroenterologist if you have not heard about the biopsies in 3 weeks.  Our staff will call the home number listed on your records the next business day following your procedure to check on you and address any questions or concerns that you may have at that time regarding the information given to you following your procedure. This is a courtesy call and so if there is no answer at the home number and we have not heard from  you through the emergency physician on call, we will assume that you have returned to your regular daily activities without incident.  SIGNATURES/CONFIDENTIALITY: You and/or your care partner have signed paperwork which will be entered into your electronic medical record.  These signatures attest to the fact that that the information above on your After Visit Summary has been reviewed and is understood.  Full responsibility of the confidentiality of this discharge information lies with you and/or your care-partner.  Recommendations Clear liquids until 10:30am, then soft foods rest of day. Resume prior diet  tomorrow. Stay on Budesonide. Office will call to make an appointment in September.

## 2014-06-08 NOTE — Progress Notes (Signed)
Report to PACU, RN, vss, BBS= Clear.  

## 2014-06-08 NOTE — Op Note (Signed)
[place]  [address] [place], [address]   ENDOSCOPY PROCEDURE REPORT  PATIENT: Anne, [name]  MR#: [id number] BIRTHDATE: 05-01-1941 , 72  yrs. old GENDER: Female ENDOSCOPIST: Gatha Mayer, MD, [facility] PROCEDURE DATE:  06/08/2014 PROCEDURE:  Esophagoscopy and Maloney dilation of esophagus ASA CLASS:     Class II INDICATIONS:  Dysphagia.   Therapeutic procedure.  Dilated esophagus MEDICATIONS: propofol (Diprivan) 150mg  IV, MAC sedation, administered by CRNA, and These medications were titrated to patient response per physician's verbal order TOPICAL ANESTHETIC: none  DESCRIPTION OF PROCEDURE: After the risks benefits and alternatives of the procedure were thoroughly explained, informed consent was obtained.  The LB [id number] endoscope was introduced through the mouth and advanced to the stomach antrum. Without limitations.  The instrument was slowly withdrawn as the mucosa was fully examined.    ESOPHAGUS: Eosinophilic esophagitis with mucosal changes that included a ringed esophagus and white spots were found in the entire esophagus.  STOMACH: Mild non-erosive gastritis (inflammation) was found in the gastric antrum.  The remainder of the upper endoscopy exam was otherwise normal. Retroflexed views revealed no abnormalities.     The scope was then withdrawn from the patient, a 65 Fr Maloney dilator passed (after removing dentures she did not claim to have), moderate heme seen, re-ispection with dilation effect as expected, and the procedure completed.  COMPLICATIONS: There were no complications. ENDOSCOPIC IMPRESSION: 1.   Eosinophilic esophagitis were found in the entire esophagus - multiple rings - dilated 44 Fr 2.   Non-erosive gastritis (inflammation) was found in the gastric antrum 3.   The remainder of the upper endoscopy exam was otherwise normal  RECOMMENDATIONS: 1.  Clear liquids until 1030, then soft foods rest of day.   Resume prior diet tomorrow. Stay on budesonide 2.  Office will call to make appointment to be seen in early to mid September  eSigned:  Gatha Mayer, MD, [facility] 06/08/2014 8:31 AM  CC:The Patient

## 2014-06-09 ENCOUNTER — Telehealth: Payer: Self-pay | Admitting: *Deleted

## 2014-06-09 NOTE — Telephone Encounter (Signed)
Number identifier, left message, follow-up  

## 2014-06-13 ENCOUNTER — Other Ambulatory Visit: Payer: Self-pay | Admitting: Family Medicine

## 2014-06-29 ENCOUNTER — Other Ambulatory Visit: Payer: Self-pay | Admitting: Family Medicine

## 2014-06-29 NOTE — Telephone Encounter (Signed)
Rx printed waiting on dr. Serena Croissant. LDM

## 2014-06-30 ENCOUNTER — Telehealth: Payer: Self-pay | Admitting: Family Medicine

## 2014-06-30 NOTE — Telephone Encounter (Addendum)
Request to speak to md/nurse about refill on LORazepam (ATIVAN) 1 MG tablet, pharmacy did not receive refill

## 2014-06-30 NOTE — Telephone Encounter (Signed)
See previous note

## 2014-06-30 NOTE — Telephone Encounter (Signed)
I remember signing this yesterday it may even be in the shred bin still, I would authorize again if I was in the building please refill her Lorazepam same strength, sig and #

## 2014-06-30 NOTE — Telephone Encounter (Signed)
Rx request faxed to pharmacy; verified with pharmacy that Rx was received [per Greg]/SLS

## 2014-07-13 ENCOUNTER — Telehealth: Payer: Self-pay

## 2014-07-13 DIAGNOSIS — E785 Hyperlipidemia, unspecified: Secondary | ICD-10-CM

## 2014-07-13 DIAGNOSIS — R000 Tachycardia, unspecified: Secondary | ICD-10-CM

## 2014-07-13 DIAGNOSIS — K219 Gastro-esophageal reflux disease without esophagitis: Secondary | ICD-10-CM

## 2014-07-13 DIAGNOSIS — F1029 Alcohol dependence with unspecified alcohol-induced disorder: Secondary | ICD-10-CM

## 2014-07-13 NOTE — Telephone Encounter (Signed)
Check tsh, cbc, renal, hepatic for hyperlipidemia, tahcycardia, reflux, alcohol dependence

## 2014-07-13 NOTE — Telephone Encounter (Signed)
Patient called requesting to have some labwork done? Please advise what labs and diagnosis?

## 2014-07-14 NOTE — Telephone Encounter (Signed)
Left a detailed message on patients answering machine and asked her to call back to schedule lab appt.  Labs ordered

## 2014-07-16 ENCOUNTER — Other Ambulatory Visit (INDEPENDENT_AMBULATORY_CARE_PROVIDER_SITE_OTHER): Payer: Commercial Managed Care - HMO

## 2014-07-16 DIAGNOSIS — F1029 Alcohol dependence with unspecified alcohol-induced disorder: Secondary | ICD-10-CM

## 2014-07-16 DIAGNOSIS — F10988 Alcohol use, unspecified with other alcohol-induced disorder: Secondary | ICD-10-CM

## 2014-07-16 DIAGNOSIS — K219 Gastro-esophageal reflux disease without esophagitis: Secondary | ICD-10-CM

## 2014-07-16 DIAGNOSIS — F1020 Alcohol dependence, uncomplicated: Secondary | ICD-10-CM

## 2014-07-16 DIAGNOSIS — E785 Hyperlipidemia, unspecified: Secondary | ICD-10-CM

## 2014-07-16 LAB — RENAL FUNCTION PANEL
ALBUMIN: 3.9 g/dL (ref 3.5–5.2)
BUN: 15 mg/dL (ref 6–23)
CO2: 31 meq/L (ref 19–32)
Calcium: 9.4 mg/dL (ref 8.4–10.5)
Chloride: 102 mEq/L (ref 96–112)
Creatinine, Ser: 0.8 mg/dL (ref 0.4–1.2)
GFR: 72.86 mL/min (ref >60.00)
Glucose, Bld: 82 mg/dL (ref 70–99)
POTASSIUM: 4.2 meq/L (ref 3.5–5.1)
Phosphorus: 3.3 mg/dL (ref 2.3–4.6)
SODIUM: 140 meq/L (ref 135–145)

## 2014-07-16 LAB — HEPATIC FUNCTION PANEL
ALT: 24 U/L (ref 0–35)
AST: 22 U/L (ref 0–37)
Albumin: 3.9 g/dL (ref 3.5–5.2)
Alkaline Phosphatase: 85 U/L (ref 39–117)
Bilirubin, Direct: 0 mg/dL (ref 0.0–0.3)
Total Bilirubin: 0.3 mg/dL (ref 0.2–1.2)
Total Protein: 7.2 g/dL (ref 6.0–8.3)

## 2014-07-16 LAB — TSH: TSH: 2.8 u[IU]/mL (ref 0.35–4.50)

## 2014-07-16 LAB — CBC
HCT: 36.3 % (ref 36.0–46.0)
HEMOGLOBIN: 12 g/dL (ref 12.0–15.0)
MCHC: 33.2 g/dL (ref 30.0–36.0)
MCV: 95.2 fl (ref 78.0–100.0)
PLATELETS: 238 10*3/uL (ref 150.0–400.0)
RBC: 3.81 Mil/uL — ABNORMAL LOW (ref 3.87–5.11)
RDW: 14.3 % (ref 11.5–15.5)
WBC: 5.4 10*3/uL (ref 4.0–10.5)

## 2014-07-23 ENCOUNTER — Telehealth: Payer: Self-pay | Admitting: *Deleted

## 2014-07-23 NOTE — Telephone Encounter (Signed)
Pt wants to know what she needs to do when her prescription runs out of LORazepam (ATIVAN) 1 MG tablet.  There was an issue with her husband's prescription of the same thing, and she was sharing her prescription with him until he could get his filled.  She knows she will run out before it is time for her prescription to be refilled.  Please advise.

## 2014-07-23 NOTE — Telephone Encounter (Signed)
Please advise? Last RX was done on 06-29-14 quantity 70 with 0 refills

## 2014-07-24 NOTE — Telephone Encounter (Signed)
Please clarify what the issue is she is not due til the end of the week

## 2014-07-26 MED ORDER — LORAZEPAM 1 MG PO TABS: 1.0000 mg | ORAL_TABLET | Freq: Three times a day (TID) | ORAL | Status: DC | PRN

## 2014-07-26 NOTE — Telephone Encounter (Signed)
She stated she would run out early because her spouse had to use some of hers, due to the pharmacy not getting his when the temp was sending this RX

## 2014-07-26 NOTE — Telephone Encounter (Signed)
rx printed and will be faxed after md signs

## 2014-07-26 NOTE — Telephone Encounter (Signed)
OK to refill now and authorize to be given

## 2014-07-31 ENCOUNTER — Other Ambulatory Visit: Payer: Self-pay | Admitting: Family Medicine

## 2014-08-02 ENCOUNTER — Ambulatory Visit (INDEPENDENT_AMBULATORY_CARE_PROVIDER_SITE_OTHER): Payer: Commercial Managed Care - HMO | Admitting: Internal Medicine

## 2014-08-02 ENCOUNTER — Encounter: Payer: Self-pay | Admitting: Internal Medicine

## 2014-08-02 VITALS — BP 110/70 | HR 88 | Ht 65.0 in | Wt 131.0 lb

## 2014-08-02 DIAGNOSIS — K200 Eosinophilic esophagitis: Secondary | ICD-10-CM

## 2014-08-02 DIAGNOSIS — K219 Gastro-esophageal reflux disease without esophagitis: Secondary | ICD-10-CM

## 2014-08-02 DIAGNOSIS — K222 Esophageal obstruction: Secondary | ICD-10-CM

## 2014-08-02 MED ORDER — PANTOPRAZOLE SODIUM 40 MG PO TBEC: 40.0000 mg | DELAYED_RELEASE_TABLET | Freq: Every day | ORAL | Status: DC

## 2014-08-02 NOTE — Assessment & Plan Note (Signed)
improved

## 2014-08-02 NOTE — Assessment & Plan Note (Addendum)
Improved Stay on PPI, if she gets recurrent dysphagia she should try budesonide slurry I think that we can dilate again I would like to avoid doing that more than is necessary I suspect the dilations helped her took a couple of days to see the effect, though avoiding wineis appropriate in her case for many reasons F/u prn

## 2014-08-02 NOTE — Patient Instructions (Signed)
Please follow up as needed.   Thank you for the opportunity to care for you.

## 2014-08-02 NOTE — Assessment & Plan Note (Signed)
Stay on PPI 

## 2014-08-02 NOTE — Progress Notes (Signed)
   Subjective:    Patient ID: Anne [name], female    DOB: Sep 06, 1941, 73 y.o.   MRN: [id number]  HPI Is here with her husband for followup of her eosinophilic esophagitis with esophageal strictures and her gastroesophageal reflux disease. She reports she is doing very well at this point. She attributes this to eliminating wine and grapes. She says that for a day or 2 after her last dilation in July she a lot of difficulty swallowing since she quit drinking wine and since then she's done well. She is not using the budesonide slurry as recommended, her husband says it's too costly in the never filled that. A candidate for $70 per pharmacy and Adrian Blackwater if need be. She is taking PPI daily. Her at least most days.  Medications, allergies, past medical history, past surgical history, family history and social history are reviewed and updated in the EMR.  Review of Systems As above    Objective:   Physical Exam Elderly white woman in no acute distress    Assessment & Plan:  Eosinophilic esophagitis Improved Stay on PPI, if she gets recurrent dysphagia she should try budesonide slurry I think that we can dilate again I would like to avoid doing that more than is necessary I suspect the dilations helped her took a couple of days to see the effect, though avoiding wineis appropriate in her case for many reasons F/u prn   ESOPHAGEAL STRICTURE improved  GERD Stay on PPI

## 2014-08-07 ENCOUNTER — Telehealth: Payer: Self-pay | Admitting: Adult Health

## 2014-08-18 ENCOUNTER — Other Ambulatory Visit: Payer: Commercial Managed Care - HMO

## 2014-08-18 ENCOUNTER — Ambulatory Visit: Payer: Commercial Managed Care - HMO | Admitting: Oncology

## 2014-08-18 ENCOUNTER — Encounter: Payer: Self-pay | Admitting: Adult Health

## 2014-08-18 NOTE — Progress Notes (Signed)
This encounter was created in error - please disregard.

## 2014-08-20 ENCOUNTER — Telehealth: Payer: Self-pay | Admitting: Adult Health

## 2014-08-20 ENCOUNTER — Other Ambulatory Visit: Payer: Self-pay | Admitting: Internal Medicine

## 2014-08-20 NOTE — Telephone Encounter (Signed)
, °

## 2014-09-04 ENCOUNTER — Other Ambulatory Visit: Payer: Self-pay | Admitting: Family Medicine

## 2014-09-06 ENCOUNTER — Other Ambulatory Visit: Payer: Self-pay | Admitting: Family Medicine

## 2014-09-06 NOTE — Telephone Encounter (Signed)
Please inform pt of mds message and schedule appt

## 2014-09-06 NOTE — Telephone Encounter (Signed)
Let patient know she needs appt for more refills and schedule appt

## 2014-09-06 NOTE — Telephone Encounter (Signed)
Lorazepam Rx printed and fowarded to Provider for signature. Pt last seen by PCP for acute visit 02/04/14 and has no future appts scheduled with PCP.  Please advise when pt should follow up with you?

## 2014-09-24 ENCOUNTER — Telehealth: Payer: Self-pay | Admitting: Family Medicine

## 2014-09-24 ENCOUNTER — Emergency Department
Admission: EM | Admit: 2014-09-24 | Discharge: 2014-09-24 | Disposition: A | Discharge status: Home / Self Care | Payer: Commercial Managed Care - HMO | Source: Home / Self Care | Attending: Emergency Medicine | Admitting: Emergency Medicine

## 2014-09-24 ENCOUNTER — Encounter: Payer: Self-pay | Admitting: Emergency Medicine

## 2014-09-24 ENCOUNTER — Emergency Department (INDEPENDENT_AMBULATORY_CARE_PROVIDER_SITE_OTHER): Payer: Medicare HMO

## 2014-09-24 DIAGNOSIS — J441 Chronic obstructive pulmonary disease with (acute) exacerbation: Secondary | ICD-10-CM

## 2014-09-24 DIAGNOSIS — R0602 Shortness of breath: Secondary | ICD-10-CM

## 2014-09-24 MED ORDER — ALBUTEROL SULFATE HFA 108 (90 BASE) MCG/ACT IN AERS: 2.0000 | INHALATION_SPRAY | RESPIRATORY_TRACT | Status: DC | PRN

## 2014-09-24 NOTE — Telephone Encounter (Signed)
FYI

## 2014-09-24 NOTE — ED Provider Notes (Signed)
CSN: [id number]     Arrival date & time 09/24/14  1836 History   First MD Initiated Contact with Patient 09/24/14 1848     Chief Complaint  Patient presents with  . Shortness of Breath  presents to [facility] urgent care Friday 7 PM  Patient is a 73 y.o. female presenting with shortness of breath. The history is provided by the patient (here with husband).  Shortness of Breath Severity:  Moderate Onset quality:  Unable to specify Duration:  1 week Timing:  Intermittent Progression:  Waxing and waning Chronicity:  Chronic Context: activity and weather changes   Context: not occupational exposure and not URI   Relieved by:  None tried Worsened by:  Exertion Associated symptoms: no abdominal pain, no chest pain, no cough, no diaphoresis, no ear pain, no fever, no rash, no sore throat, no sputum production, no syncope, no swollen glands, no vomiting and no wheezing    Denies any URI prodrome. Denies sinus congestion or sore throat or fever. Denies wheezing. Patient requests chest x-ray, and she states she's been worrying excessively that she may have an abnormality on her chest x-ray. She has long-standing history of COPD, but she feels dyspnea on exertion or walking up steps has worsened over the past 4-7 days.  Denies chest pain at rest or with exertion. Recalls no recent injury.  Past Medical History  Diagnosis Date  . Anemia   . COPD (chronic obstructive pulmonary disease)   . Hyperlipidemia   . Pneumonia 2-10    pleurisy  . Arthritis   . Depression   . GERD (gastroesophageal reflux disease) 09/29/2009    improved s/p cholecystectomy and esophagus dilatation  . History of chicken pox   . History of shingles     2 episodes  . History of measles   . Osteopenia 03/14/2011  . 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 Pure hypercholesterolemia 10/17/2010  . 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 ESOPHAGEAL STRICTURE 03/29/2009  . CAROTID ARTERY STENOSIS  01/13/2010  . ALKALINE PHOSPHATASE, ELEVATED 03/15/2009  . Anxiety and depression 04/28/2011  . Vaginitis 05/22/2011  . Baker's cyst of knee 05/22/2011  . Trauma 10/18/2011  . Radial neck fracture 10/2011    minimally displaced  . Atypical chest pain 11/30/2011  . Folliculitis of nose 7/32/2025  . Esophageal ring   . 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 Urinary incontinence 03/19/2012  . Allergic state 06/10/2012  . Hematuria   . Dysphagia   . URI (upper respiratory infection) 09/02/2012  . Preventative health care 10/21/2012  . Dermatitis 11/23/2012  . Fall 11/23/2012  . Hx of echocardiogram     a. Echo 01/2103: mild LVH, EF 55-60%, normal wall motion, Gr 1 diast dysfn  . Knee pain, bilateral 07/23/2011  . Elevated sed rate 08/02/2013  . EE (eosinophilic esophagitis)    Past Surgical History  Procedure Laterality Date  . Anterior cruciate ligament repair  08, 09, 10  . Ercp, cbd stone extraction  2010  . Cholecystectomy  2010  . Egd/ dili  2010, 2012  . Breast surgery  2001    lumpectomy (01)  . Colonoscopy  09/05/10    cecal avm's  . Esophagogastroduodenoscopy  01/08/2012    Procedure: ESOPHAGOGASTRODUODENOSCOPY (EGD);  Surgeon: Gatha Mayer, MD;  Location: [facility];  Service: Endoscopy;  Laterality: N/A;  . Savory dilation  01/08/2012    Procedure: SAVORY DILATION;  Surgeon: Gatha Mayer, MD;  Location: [facility];  Service: Endoscopy;  Laterality: N/A;  need xray  . Mastectomy modified radical      , Mastectomy modified radical (08), breast reconstruction, CA lesions excised lateral abd wall 2010  . Breast reconstructive surgery  2008, 2009, 2010   Family History  Problem Relation Age of Onset  . Heart disease Father   . Lung cancer Father     smoker  . Cirrhosis Sister     Primary Biliary  . Breast cancer Maternal Aunt     x 3  . Stroke Maternal Grandmother   . Alcohol abuse Maternal Grandfather   . Heart disease Paternal Grandfather   .  Anxiety disorder Sister   . Osteoporosis Sister   . Arthritis Sister     Rheumatoid  . Osteoporosis Sister   . Cancer Sister     multiple skin cancers, over 63 excisions.  . Other Mother     tic deleauroux  . Anesthesia problems Neg Hx   . Hypotension Neg Hx   . Malignant hyperthermia Neg Hx   . Pseudochol deficiency Neg Hx   . Colon cancer Neg Hx   . Ovarian cancer Neg Hx   . Pancreatic cancer Neg Hx   . Breast cancer Paternal Aunt    History  Substance Use Topics  . Smoking status: Former Smoker    Types: Cigarettes    Quit date: 05/22/2007  . Smokeless tobacco: Never Used  . Alcohol Use: 0.6 oz/week    1 Glasses of wine per week     Comment: at least 6 oz daily   OB History    No data available     Review of Systems  Constitutional: Negative for fever and diaphoresis.  HENT: Negative for ear pain and sore throat.   Respiratory: Positive for shortness of breath. Negative for cough, sputum production and wheezing.   Cardiovascular: Negative for chest pain and syncope.  Gastrointestinal: Negative for vomiting and abdominal pain.  Skin: Negative for rash.  All other systems reviewed and are negative.   Allergies  Codeine; Diphenhydramine hcl; Grapeseed extract; Morphine; Peanut-containing drug products; Tomato; and Zofran  Home Medications   Prior to Admission medications   Medication Sig Start Date End Date Taking? Authorizing Provider  albuterol (PROVENTIL HFA;VENTOLIN HFA) 108 (90 BASE) MCG/ACT inhaler Inhale 2 puffs into the lungs every 4 (four) hours as needed for wheezing or shortness of breath. 09/24/14   Jacqulyn Cane, MD  amitriptyline (ELAVIL) 150 MG tablet take 1 tablet by mouth at bedtime 09/07/14   [name], MD  aspirin 81 MG tablet Take 81 mg by mouth daily.      Historical Provider, MD  atorvastatin (LIPITOR) 40 MG tablet Take 1 tablet (40 mg total) by mouth daily. 03/02/14   Fay Records, MD  cetirizine (ZYRTEC) 10 MG tablet Take 1 tablet (10 mg  total) by mouth daily. For itch 05/31/14   Jacqulyn Cane, MD  Cholecalciferol (VITAMIN D3) 1000 UNITS CAPS Take 2 tablets by mouth daily.      Historical Provider, MD  ibuprofen (ADVIL,MOTRIN) 200 MG tablet Take 400 mg by mouth every 6 (six) hours as needed for mild pain.    Historical Provider, MD  loratadine (CLARITIN) 10 MG tablet Take 10 mg by mouth daily.    Historical Provider, MD  LORazepam (ATIVAN) 1 MG tablet take 1 tablet every 8 hours if needed 09/06/14   [name], MD  pantoprazole (  PROTONIX) 40 MG tablet Take 1 tablet (40 mg total) by mouth 2 (two) times daily. 08/20/14   Gatha Mayer, MD  Probiotic Product (PROBIOTIC FORMULA PO) Take 1 capsule by mouth daily.    Historical Provider, MD  triamcinolone cream (KENALOG) 0.1 % Apply to affected areas 2 or 3 times daily as directed. 05/31/14   Jacqulyn Cane, MD  vitamin B-12 (CYANOCOBALAMIN) 1000 MCG tablet Take 1,000 mcg by mouth daily.      Historical Provider, MD   BP 105/69 mmHg  Pulse 94  Temp(Src) 97.9 F (36.6 C) (Oral)  Ht 5\' 6"  (1.676 m)  Wt 132 lb (59.875 kg)  BMI 21.32 kg/m2  SpO2 96% Physical Exam  Constitutional: She is oriented to person, place, and time. She appears well-developed and well-nourished. No distress.  No acute distress. Alert, cooperative. Here with husband. Pulse ox 96% on room air  HENT:  Head: Normocephalic and atraumatic.  Mouth/Throat: Oropharynx is clear and moist. No oropharyngeal exudate.  Eyes: Conjunctivae and EOM are normal. Pupils are equal, round, and reactive to light. Right eye exhibits no discharge. Left eye exhibits no discharge. No scleral icterus.  Neck: Normal range of motion. Neck supple. No JVD present. No tracheal deviation present.  Cardiovascular: Normal rate, regular rhythm and normal heart sounds.   No murmur heard. Pulmonary/Chest: Effort normal and breath sounds normal. No stridor. No respiratory distress. She has no wheezes. She has no rales. She exhibits no tenderness.   Abdominal: Soft. She exhibits no distension.  Musculoskeletal: Normal range of motion. She exhibits no edema or tenderness.  Lymphadenopathy:    She has no cervical adenopathy.  Neurological: She is alert and oriented to person, place, and time. No cranial nerve deficit.  Skin: Skin is warm. No rash noted. She is not diaphoretic.  Psychiatric: She has a normal mood and affect.  Nursing note and vitals reviewed.   ED Course  Procedures (including critical care time) Labs Review Labs Reviewed - No data to display Patient was quite insistent on getting chest x-ray at this urgent care visit, so chest x-ray ordered. Imaging Review Dg Chest 2 View  09/24/2014   CLINICAL DATA:  Chronic shortness of breath for 1 week  EXAM: CHEST  2 VIEW  COMPARISON:  02/04/2014  FINDINGS: Cardiac shadow is within normal limits. The lungs are well aerated bilaterally without focal infiltrate or sizable effusion. Changes consistent with prior mastectomy with reconstruction on the right are seen. No acute bony abdomen wrist cm.  IMPRESSION: No acute abnormality noted.   Electronically Signed   By: Inez Catalina M.D.   On: 09/24/2014 19:29     MDM   1. COPD with acute exacerbation   2. Shortness of breath   3. Shortness of breath   Pulse ox normal 96%. Lung exam normal without wheezes.  Chest x-ray normal.--when I informed patient of this,she smiled and stated that she was very relieved to hear that. By her history, she might have episodic, mild exacerbation of COPD, although physical exam normal. Treatment options discussed, as well as risks, benefits, alternatives. Although we discussed the option of prednisone, patient and husband and wife and I agree to not prescribe prednisone at this time. Patient voiced understanding and agreement with the following plans: New Prescriptions   ALBUTEROL (PROVENTIL HFA;VENTOLIN HFA) 108 (90 BASE) MCG/ACT INHALER    Inhale 2 puffs into the lungs every 4 (four) hours as  needed for wheezing or shortness of breath.   Other advice  and precautions given. Follow-up with your primary care doctor in 4-5 days , or sooner if symptoms become worse. Red flags discussed.--Emergency room if any red flag Questions invited and answered. Patient and husband voiced understanding and agreement.     Jacqulyn Cane, MD 09/24/14 [phone]

## 2014-09-24 NOTE — Telephone Encounter (Signed)
Patient Information:  Caller Name: Sam  Phone: [phone]  Patient: Anne [name], Anne [name]  Gender: Female  DOB: 06-18-1941  Age: 73 Years  PCP: Penni Homans [facility])  Office Follow Up:  Does the office need to follow up with this patient?: Yes  Instructions For The Office: Transferred form office for triage. Apointment not available in office for patient - especially on urgent basis.   Please review can contact patient for urgent appointment.  Patient can be reached at  [phone].   Symptoms  Reason For Call & Symptoms: Hx of emphysema but seemed to be controlled.  Increasing SOB in the last week.  If gets up to do something, will need to go sit down.  Sometimes when talking does not feel she gets enough air.  Will feel dizzy when stands up at times.  Reviewed Health History In EMR: Yes  Reviewed Medications In EMR: Yes  Reviewed Allergies In EMR: Yes  Reviewed Surgeries / Procedures: Yes  Date of Onset of Symptoms: 09/17/2014  Treatments Tried: sitting down, relax will help decrease Sx most of the time  Treatments Tried Worked: Yes  Guideline(s) Used:  Breathing Difficulty  Disposition Per Guideline:   Go to Office Now  Reason For Disposition Reached:   Mild difficulty breathing (e.g., minimal/no SOB at rest, SOB with walking, pulse < 100) of new onset or worse than normal  Advice Given:  N/A  Patient Will Follow Care Advice:  YES

## 2014-09-24 NOTE — ED Notes (Signed)
SOB x 3-4 days, patient has emphysema and has been SOB going up stairs or with any exertion at all

## 2014-09-24 NOTE — Telephone Encounter (Signed)
Spoke with patient who reported the same as below.  There are no openings available at [facility].  Therefore, she was advised to go to the ER or UC.  Pt stated that she did not want to go to the ER as it would take too long to be seen.  She agreed to go to Urgent Care instead.

## 2014-09-30 ENCOUNTER — Telehealth: Payer: Self-pay

## 2014-09-30 NOTE — Telephone Encounter (Signed)
Patient and her husband have walked in today, wanting to make an appointment with dr. Harrington Challenger. Apparently she was seen at an Urgent Care on 09/24/2014 for SOB. It was determined she had a COPD Exacerbation and was given an Albuterol MDI.  Was advised to followup with her PCP. Today, she states she has had a few spells where she felt dizzy, to the point of passing out, but  Never really does. History of  Orthostatic intolerance, by chart. She denies dizziness, lightheadedness or any other symptoms at present. I advised her to call her PCP tomorrow, drink more decaffeinated fluids, and get up slowly from sitting or lying position. Both she and husband voiced understanding.

## 2014-10-14 ENCOUNTER — Telehealth: Payer: Self-pay | Admitting: Oncology

## 2014-10-18 ENCOUNTER — Other Ambulatory Visit: Payer: Self-pay | Admitting: Oncology

## 2014-10-18 ENCOUNTER — Encounter: Payer: Self-pay | Admitting: Oncology

## 2014-10-18 ENCOUNTER — Telehealth: Payer: Self-pay | Admitting: Oncology

## 2014-10-18 ENCOUNTER — Ambulatory Visit (HOSPITAL_BASED_OUTPATIENT_CLINIC_OR_DEPARTMENT_OTHER): Payer: Commercial Managed Care - HMO

## 2014-10-18 ENCOUNTER — Ambulatory Visit (HOSPITAL_BASED_OUTPATIENT_CLINIC_OR_DEPARTMENT_OTHER): Payer: Commercial Managed Care - HMO | Admitting: Oncology

## 2014-10-18 ENCOUNTER — Other Ambulatory Visit (HOSPITAL_BASED_OUTPATIENT_CLINIC_OR_DEPARTMENT_OTHER): Payer: Commercial Managed Care - HMO

## 2014-10-18 VITALS — BP 105/60 | HR 98 | Temp 98.0°F | Resp 18 | Ht 66.0 in | Wt 130.6 lb

## 2014-10-18 DIAGNOSIS — M8580 Other specified disorders of bone density and structure, unspecified site: Secondary | ICD-10-CM

## 2014-10-18 DIAGNOSIS — C50911 Malignant neoplasm of unspecified site of right female breast: Secondary | ICD-10-CM

## 2014-10-18 DIAGNOSIS — Z853 Personal history of malignant neoplasm of breast: Secondary | ICD-10-CM

## 2014-10-18 LAB — COMPREHENSIVE METABOLIC PANEL (CC13)
ALT: 14 U/L (ref 0–55)
ANION GAP: 9 meq/L (ref 3–11)
AST: 15 U/L (ref 5–34)
Albumin: 3.6 g/dL (ref 3.5–5.0)
Alkaline Phosphatase: 103 U/L (ref 40–150)
BUN: 12.7 mg/dL (ref 7.0–26.0)
CALCIUM: 9.4 mg/dL (ref 8.4–10.4)
CHLORIDE: 103 meq/L (ref 98–109)
CO2: 29 meq/L (ref 22–29)
Creatinine: 0.8 mg/dL (ref 0.6–1.1)
EGFR: 76 mL/min/{1.73_m2} — ABNORMAL LOW (ref >90)
Glucose: 83 mg/dl (ref 70–140)
Potassium: 4.1 mEq/L (ref 3.5–5.1)
Sodium: 141 mEq/L (ref 136–145)
Total Bilirubin: 0.42 mg/dL (ref 0.20–1.20)
Total Protein: 6.6 g/dL (ref 6.4–8.3)

## 2014-10-18 LAB — CBC WITH DIFFERENTIAL/PLATELET
BASO%: 0.6 % (ref 0.0–2.0)
BASOS ABS: 0.1 10*3/uL (ref 0.0–0.1)
EOS%: 1.5 % (ref 0.0–7.0)
Eosinophils Absolute: 0.1 10*3/uL (ref 0.0–0.5)
HCT: 35.5 % (ref 34.8–46.6)
HEMOGLOBIN: 11.4 g/dL — AB (ref 11.6–15.9)
LYMPH#: 1.6 10*3/uL (ref 0.9–3.3)
LYMPH%: 17.2 % (ref 14.0–49.7)
MCH: 30.8 pg (ref 25.1–34.0)
MCHC: 32 g/dL (ref 31.5–36.0)
MCV: 96.2 fL (ref 79.5–101.0)
MONO#: 0.5 10*3/uL (ref 0.1–0.9)
MONO%: 5.8 % (ref 0.0–14.0)
NEUT%: 74.9 % (ref 38.4–76.8)
NEUTROS ABS: 6.9 10*3/uL — AB (ref 1.5–6.5)
Platelets: 228 10*3/uL (ref 145–400)
RBC: 3.69 10*6/uL — AB (ref 3.70–5.45)
RDW: 14.1 % (ref 11.2–14.5)
WBC: 9.1 10*3/uL (ref 3.9–10.3)

## 2014-10-18 MED ORDER — ANASTROZOLE 1 MG PO TABS: 1.0000 mg | ORAL_TABLET | Freq: Every day | ORAL | Status: DC

## 2014-10-18 NOTE — Progress Notes (Signed)
OFFICE PROGRESS NOTE    10-18-2014  Physicians: S.Cindy Hazy (urology), C.Gessner, G.Chapman Fitch Sanger  INTERVAL HISTORY:  Patient is seen, together with husband, for first time back by this MD since 11-2012, in interim seen by other physicians at this office. I have known her since first breast cancer diagnosis in 2001; she is presently being followed also for second primary breast cancer which was locally recurrent in 07-2009. Patient tells me that she stopped Arimidex sometime after bone density scan 02-2014 due to her concerns about decrease in bone density. Most recent left mammogram was at [facility] 02-15-2014, with heterogeneously dense breast tissue but no mammographic findings of concern.  She had DEXA at [facility] 02-15-14, with lowest T score in LS at -1.9, which was 3.9% decrease in bone density in LS and no significant change in hips, I believe compared with 2013. She does not take calcium supplements but eats dairy products regularly and is on supplemental Vitamin D. She had labwork by Dr Charlett Blake in this EMR 07-2014, including normal renal panel and normal hepatic panel. She was seen in ED 09-24-14 possibly for SOB, no findings of concern and symptoms resolved.  Patient tells me that she has generally been feeling well, tho she has scratchy throat this AM, grandson with URI over weekend. She denies any changes on breast self exam. She denies new or different pain, respiratory or GI problems. Energy is at baseline. She has not walked regularly since husband's work schedule has made that difficult.   ONCOLOGIC HISTORY History is of 2 primary lobular right breast carcinomas. Her initial diagnosis was March 2001, treated systemically with CMF followed by 5 years of tamoxifen. Second diagnosis was Jan. 2008 (tho patient did not agree to surgery until July 2008) and subsequently had no adjuvant systemic treatment at her decision. She had a complicated course with difficult healing  after right breast reconstruction. She had skin recurrence lateral right chest wall excised in Sept 2010 then treated with xeloda from Oct 2010 thru Feb 2011 and has been on Arimidex since July 2011, which she is tolerating well. She has had no documented active disease since this most recent treatment, including PET 05-14-12 with no evidence of metastatic disease. Bone density scan [facility] 02-15-14 still osteopenic range, slightly lower in LS compared with 2013 and stable in hips. Last left mammogram at [facility] 02-15-14.  Review of systems as above, also: No bleeding. No LE swelling. Bowels ok. Not complaining of hot flashes, arthralgias or hair loss. Remainder of 10 point Review of Systems negative.  Objective:  Vital signs in last 24 hours:  BP 105/60 mmHg  Pulse 98  Temp(Src) 98 F (36.7 C) (Oral)  Resp 18  Ht 5' 6"  (1.676 m)  Wt 130 lb 9.6 oz (59.24 kg)  BMI 21.09 kg/m2  SpO2 99%  Weight up 4 lbs. Alert, oriented and appropriate. Ambulatory without difficulty.  No alopecia.  HEENT:PERRL, sclerae not icteric. Oral mucosa moist without lesions, posterior pharynx clear.  Neck supple. No JVD.  Lymphatics:no cervical,supraclavicular, axillary or inguinal adenopathy Resp: clear to auscultation bilaterally and normal percussion bilaterally Cardio: regular rate and rhythm. No gallop. GI: soft, nontender, not distended, no mass or organomegaly. Normally active bowel sounds.  Musculoskeletal/ Extremities: without pitting edema, cords, tenderness Neuro:  nonfocal  PSYCH appropriate mood and affect Skin without rash, ecchymosis, petechiae Breasts: left without dominant mass, skin or nipple findings. Reconstructed right breast without evidence of local recurrence. Axillae benign.   Lab Results:  Results for orders placed or performed in visit on 07/16/14  Hepatic function panel  Result Value Ref Range   Total Bilirubin 0.3 0.2 - 1.2 mg/dL   Bilirubin, Direct 0.0 0.0 - 0.3 mg/dL    Alkaline Phosphatase 85 39 - 117 U/L   AST 22 0 - 37 U/L   ALT 24 0 - 35 U/L   Total Protein 7.2 6.0 - 8.3 g/dL   Albumin 3.9 3.5 - 5.2 g/dL  Renal function panel  Result Value Ref Range   Sodium 140 135 - 145 mEq/L   Potassium 4.2 3.5 - 5.1 mEq/L   Chloride 102 96 - 112 mEq/L   CO2 31 19 - 32 mEq/L   Calcium 9.4 8.4 - 10.5 mg/dL   Albumin 3.9 3.5 - 5.2 g/dL   BUN 15 6 - 23 mg/dL   Creatinine, Ser 0.8 0.4 - 1.2 mg/dL   Glucose, Bld 82 70 - 99 mg/dL   Phosphorus 3.3 2.3 - 4.6 mg/dL   GFR 72.86 >60.00 mL/min  CBC  Result Value Ref Range   WBC 5.4 4.0 - 10.5 K/uL   RBC 3.81 (L) 3.87 - 5.11 Mil/uL   Platelets 238.0 150.0 - 400.0 K/uL   Hemoglobin 12.0 12.0 - 15.0 g/dL   HCT 36.3 36.0 - 46.0 %   MCV 95.2 78.0 - 100.0 fl   MCHC 33.2 30.0 - 36.0 g/dL   RDW 14.3 11.5 - 15.5 %  TSH  Result Value Ref Range   TSH 2.80 0.35 - 4.50 uIU/mL   LABS available after this visit: CMET WNL other than eGFR 76 CBC with WBC 9.1, Hgb 11.4, MCV 96.2, plt 228k  Studies/Results:   CHEST 2 VIEW  Oct 10, 2014  COMPARISON: 02/04/2014  FINDINGS: Cardiac shadow is within normal limits. The lungs are well aerated bilaterally without focal infiltrate or sizable effusion. Changes consistent with prior mastectomy with reconstruction on the right are seen. No acute bony abdomen wrist cm.  IMPRESSION: No acute abnormality noted.   Medications: I have reviewed the patient's current medications.  DISCUSSION: reviewed bone density report, which is just minimally lower in LS. She is in agreement with resuming Arimidex and hopefully will resume regular walking for exercise, as she and husband had enjoyed this in past (used to walk 5 miles daily). She will have mammograms in 02-2015 and I will see her back in 6 months.  Assessment/Plan: 1.two primary lobular right breast carcinomas, in 2001 and 2008, with lateral right chest wall recurrence 07-2009: Resume arimidex. Mammogram 02-2015. Return visit with labs  6 months 2.osteopenia: needs at least 1200 mg calcium daily. Resume regular weight bearing exercise 3.long past tobacco 4.heavy ETOH at least in past 5.hx anxiety and depression 6.needs flu vaccine, which she will have at Dr Frederik Pear office next week as long as no viral illness then 7.chronic urologic problems known to urology 8.previous esophageal dilitations    All questions answered. Patient and husband are in agreement with recommendations and know that they can call prior to next scheduled visit if needed. Time spent 25 min including >50% counseling and coordination of care.  Dandra Velardi P, MD   10/18/2014, 12:05 PM

## 2014-10-18 NOTE — Telephone Encounter (Signed)
per pof to sch pt appt-[facility] sch not opened in June-sent back to lab-adv pt we will call with 04/2015 appt-pt understood

## 2014-10-25 ENCOUNTER — Ambulatory Visit (INDEPENDENT_AMBULATORY_CARE_PROVIDER_SITE_OTHER): Payer: Commercial Managed Care - HMO | Admitting: Family Medicine

## 2014-10-25 ENCOUNTER — Encounter: Payer: Self-pay | Admitting: Family Medicine

## 2014-10-25 VITALS — BP 124/82 | HR 92 | Temp 98.2°F | Wt 132.0 lb

## 2014-10-25 DIAGNOSIS — F419 Anxiety disorder, unspecified: Secondary | ICD-10-CM

## 2014-10-25 DIAGNOSIS — E7800 Pure hypercholesterolemia, unspecified: Secondary | ICD-10-CM

## 2014-10-25 DIAGNOSIS — R109 Unspecified abdominal pain: Secondary | ICD-10-CM

## 2014-10-25 DIAGNOSIS — M25551 Pain in right hip: Secondary | ICD-10-CM

## 2014-10-25 DIAGNOSIS — F32A Depression, unspecified: Secondary | ICD-10-CM

## 2014-10-25 DIAGNOSIS — F329 Major depressive disorder, single episode, unspecified: Secondary | ICD-10-CM

## 2014-10-25 DIAGNOSIS — C50911 Malignant neoplasm of unspecified site of right female breast: Secondary | ICD-10-CM

## 2014-10-25 DIAGNOSIS — M545 Low back pain: Secondary | ICD-10-CM

## 2014-10-25 DIAGNOSIS — K219 Gastro-esophageal reflux disease without esophagitis: Secondary | ICD-10-CM

## 2014-10-25 DIAGNOSIS — F418 Other specified anxiety disorders: Secondary | ICD-10-CM

## 2014-10-25 MED ORDER — LORAZEPAM 1 MG PO TABS: 1.0000 mg | ORAL_TABLET | Freq: Three times a day (TID) | ORAL | Status: DC | PRN

## 2014-10-25 MED ORDER — HYOSCYAMINE SULFATE 0.125 MG SL SUBL: 0.1250 mg | SUBLINGUAL_TABLET | SUBLINGUAL | Status: DC | PRN

## 2014-10-25 MED ORDER — LIDOCAINE 5 % EX PTCH: 2.0000 | MEDICATED_PATCH | Freq: Two times a day (BID) | CUTANEOUS | Status: DC

## 2014-10-25 NOTE — Patient Instructions (Signed)

## 2014-10-25 NOTE — Progress Notes (Signed)
Pre visit review using our clinic review tool, if applicable. No additional management support is needed unless otherwise documented below in the visit note. 

## 2014-11-01 ENCOUNTER — Emergency Department
Admission: EM | Admit: 2014-11-01 | Discharge: 2014-11-01 | Disposition: A | Discharge status: Home / Self Care | Payer: Commercial Managed Care - HMO | Source: Home / Self Care | Attending: Emergency Medicine | Admitting: Emergency Medicine

## 2014-11-01 ENCOUNTER — Encounter: Payer: Self-pay | Admitting: *Deleted

## 2014-11-01 ENCOUNTER — Emergency Department (INDEPENDENT_AMBULATORY_CARE_PROVIDER_SITE_OTHER): Payer: Commercial Managed Care - HMO

## 2014-11-01 DIAGNOSIS — M25572 Pain in left ankle and joints of left foot: Secondary | ICD-10-CM

## 2014-11-01 DIAGNOSIS — S8002XA Contusion of left knee, initial encounter: Secondary | ICD-10-CM

## 2014-11-01 DIAGNOSIS — R938 Abnormal findings on diagnostic imaging of other specified body structures: Secondary | ICD-10-CM

## 2014-11-01 DIAGNOSIS — T1490XA Injury, unspecified, initial encounter: Secondary | ICD-10-CM

## 2014-11-01 DIAGNOSIS — R52 Pain, unspecified: Secondary | ICD-10-CM

## 2014-11-01 DIAGNOSIS — M25562 Pain in left knee: Secondary | ICD-10-CM

## 2014-11-01 DIAGNOSIS — S82892A Other fracture of left lower leg, initial encounter for closed fracture: Secondary | ICD-10-CM

## 2014-11-01 NOTE — Assessment & Plan Note (Signed)
Doing well today, no recent conflicts or difficulties reported today, she is here today with her husband

## 2014-11-01 NOTE — Assessment & Plan Note (Signed)
Avoid offending foods, start probiotics. Do not eat large meals in late evening and consider raising head of bed. Is doing fairly well at present.

## 2014-11-01 NOTE — ED Provider Notes (Signed)
CSN: [id number]     Arrival date & time 11/01/14  1728 History   First MD Initiated Contact with Patient 11/01/14 1757     Chief Complaint  Patient presents with  . Foot Injury  . Fall    HPI Pt c/o LT ankle, LT lower leg and mild neck pain post fall on 10/30/14. She denies left foot pain. She reports taking 2 Advil 1630, and that seems to help. The worst pain is left lateral ankle pain, but she is able to weight-bear. The pain is moderate, sharp, worse with movement left ankle. No paresthesias or weakness. Associated with some swelling and mild bruising. Also complains of mild diffuse left knee pain. Has minimal left posterior neck pain but no midline pain. No radiation of pain. Denies focal neurologic symptoms, vertigo, syncope, headache No nausea or vomiting or fever or chills or cardiorespiratory symptoms or bowel or bladder dysfunction. Past Medical History  Diagnosis Date  . Anemia   . COPD (chronic obstructive pulmonary disease)   . Hyperlipidemia   . Pneumonia 2-10    pleurisy  . Arthritis   . Depression   . GERD (gastroesophageal reflux disease) 09/29/2009    improved s/p cholecystectomy and esophagus dilatation  . History of chicken pox   . History of shingles     2 episodes  . History of measles   . Osteopenia 03/14/2011  . 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 Pure hypercholesterolemia 10/17/2010  . 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 ESOPHAGEAL STRICTURE 03/29/2009  . CAROTID ARTERY STENOSIS 01/13/2010  . ALKALINE PHOSPHATASE, ELEVATED 03/15/2009  . Anxiety and depression 04/28/2011  . Vaginitis 05/22/2011  . Baker's cyst of knee 05/22/2011  . Trauma 10/18/2011  . Radial neck fracture 10/2011    minimally displaced  . Atypical chest pain 11/30/2011  . Folliculitis of nose 0/05/6225  . Esophageal ring   . 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 Urinary incontinence 03/19/2012  . Allergic state 06/10/2012  . Hematuria   .  Dysphagia   . URI (upper respiratory infection) 09/02/2012  . Preventative health care 10/21/2012  . Dermatitis 11/23/2012  . Fall 11/23/2012  . Hx of echocardiogram     a. Echo 01/2103: mild LVH, EF 55-60%, normal wall motion, Gr 1 diast dysfn  . Knee pain, bilateral 07/23/2011  . Elevated sed rate 08/02/2013  . EE (eosinophilic esophagitis)    Past Surgical History  Procedure Laterality Date  . Anterior cruciate ligament repair  08, 09, 10  . Ercp, cbd stone extraction  2010  . Cholecystectomy  2010  . Egd/ dili  2010, 2012  . Breast surgery  2001    lumpectomy (01)  . Colonoscopy  09/05/10    cecal avm's  . Esophagogastroduodenoscopy  01/08/2012    Procedure: ESOPHAGOGASTRODUODENOSCOPY (EGD);  Surgeon: Gatha Mayer, MD;  Location: [facility];  Service: Endoscopy;  Laterality: N/A;  . Savory dilation  01/08/2012    Procedure: SAVORY DILATION;  Surgeon: Gatha Mayer, MD;  Location: [facility];  Service: Endoscopy;  Laterality: N/A;  need xray  . Mastectomy modified radical      , Mastectomy modified radical (08), breast reconstruction, CA lesions excised lateral abd wall 2010  . Breast reconstructive surgery  2008, 2009, 2010   Family History  Problem Relation Age of Onset  . Heart disease Father   . Lung cancer Father     smoker  .  Cirrhosis Sister     Primary Biliary  . Breast cancer Maternal Aunt     x 3  . Stroke Maternal Grandmother   . Alcohol abuse Maternal Grandfather   . Heart disease Paternal Grandfather   . Anxiety disorder Sister   . Osteoporosis Sister   . Arthritis Sister     Rheumatoid  . Osteoporosis Sister   . Cancer Sister     multiple skin cancers, over 26 excisions.  . Other Mother     tic deleauroux  . Anesthesia problems Neg Hx   . Hypotension Neg Hx   . Malignant hyperthermia Neg Hx   . Pseudochol deficiency Neg Hx   . Colon cancer Neg Hx   . Ovarian cancer Neg Hx   . Pancreatic cancer Neg Hx   . Breast cancer Paternal Aunt     History  Substance Use Topics  . Smoking status: Former Smoker    Types: Cigarettes    Quit date: 05/22/2007  . Smokeless tobacco: Never Used  . Alcohol Use: 0.6 oz/week    1 Glasses of wine per week     Comment: at least 6 oz daily   OB History    No data available     Review of Systems  All other systems reviewed and are negative.   Allergies  Codeine; Diphenhydramine hcl; Grapeseed extract; Morphine; Peanut-containing drug products; Tomato; and Zofran  Home Medications   Prior to Admission medications   Medication Sig Start Date End Date Taking? Authorizing Provider  albuterol (PROVENTIL HFA;VENTOLIN HFA) 108 (90 BASE) MCG/ACT inhaler Inhale 2 puffs into the lungs every 4 (four) hours as needed for wheezing or shortness of breath. 09/24/14   Jacqulyn Cane, MD  amitriptyline (ELAVIL) 150 MG tablet take 1 tablet by mouth at bedtime 09/07/14   [name], MD  anastrozole (ARIMIDEX) 1 MG tablet Take 1 tablet (1 mg total) by mouth daily. 10/18/14   Lennis Marion Downer, MD  aspirin 81 MG tablet Take 81 mg by mouth daily.      Historical Provider, MD  atorvastatin (LIPITOR) 40 MG tablet Take 1 tablet (40 mg total) by mouth daily. 03/02/14   Fay Records, MD  cetirizine (ZYRTEC) 10 MG tablet Take 1 tablet (10 mg total) by mouth daily. For itch 05/31/14   Jacqulyn Cane, MD  Cholecalciferol (VITAMIN D3) 1000 UNITS CAPS Take 2 tablets by mouth daily.      Historical Provider, MD  hyoscyamine (LEVSIN SL) 0.125 MG SL tablet Place 1 tablet (0.125 mg total) under the tongue every 4 (four) hours as needed. 10/25/14   [name], MD  lidocaine (LIDODERM) 5 % Place 2 patches onto the skin every 12 (twelve) hours. Remove & Discard patch within 12 hours or as directed by MD 10/25/14 10/25/15  [name], MD  loratadine (CLARITIN) 10 MG tablet Take 10 mg by mouth daily.    Historical Provider, MD  LORazepam (ATIVAN) 1 MG tablet Take 1 tablet (1 mg total) by mouth every 8 (eight) hours as  needed for anxiety. 10/25/14   [name], MD  pantoprazole (PROTONIX) 40 MG tablet Take 1 tablet (40 mg total) by mouth 2 (two) times daily. 08/20/14   Gatha Mayer, MD  Probiotic Product (PROBIOTIC FORMULA PO) Take 1 capsule by mouth daily.    Historical Provider, MD  vitamin B-12 (CYANOCOBALAMIN) 1000 MCG tablet Take 1,000 mcg by mouth daily.      Historical Provider, MD  BP 117/80 mmHg  Pulse 105  Temp(Src) 98.2 F (36.8 C) (Oral)  Resp 16  Ht 5\' 6"  (1.676 m)  Wt 130 lb (58.968 kg)  BMI 20.99 kg/m2  SpO2 95% Physical Exam  Constitutional: She is oriented to person, place, and time. She appears well-developed and well-nourished. No distress.  HENT:  Head: Normocephalic and atraumatic.  Eyes: Conjunctivae and EOM are normal. Pupils are equal, round, and reactive to light. No scleral icterus.  Neck: Normal range of motion.  Cardiovascular: Normal rate.   Pulmonary/Chest: Effort normal.  Abdominal: She exhibits no distension.  Musculoskeletal:       Left knee: She exhibits normal range of motion and no swelling. Tenderness found.       Left ankle: She exhibits decreased range of motion, swelling and ecchymosis. She exhibits no laceration and normal pulse. Tenderness. Lateral malleolus and AITFL tenderness found. No medial malleolus, no posterior TFL and no head of 5th metatarsal tenderness found.       Cervical back: She exhibits spasm (Minimal). She exhibits normal range of motion, no tenderness, no bony tenderness, no swelling, no edema and no deformity.       Left lower leg: She exhibits no bony tenderness and no edema.       Left foot: There is no bony tenderness and normal capillary refill.  Neurological: She is alert and oriented to person, place, and time. She has normal strength. No cranial nerve deficit or sensory deficit. Gait normal.  Skin: Skin is warm.  Psychiatric: She has a normal mood and affect.  Nursing note and vitals reviewed.   ED Course  Procedures  (including critical care time) Labs Review Labs Reviewed - No data to display  Imaging Review Dg Knee 2 Views Left  11/01/2014   CLINICAL DATA:  Two day history of pain  EXAM: LEFT KNEE - 1-2 VIEW  COMPARISON:  Apr 08, 2013  FINDINGS: Frontal and lateral views were obtained. There is no fracture or dislocation. No effusion. There is slight narrowing of the patellofemoral joint with small posterior calcaneal spurs. There is also minimal spurring medially. No erosive change.  IMPRESSION: Mild osteoarthritic change. No fracture or dislocation. No effusion.   Electronically Signed   By: Lowella Grip M.D.   On: 11/01/2014 18:40   Dg Ankle Complete Left  11/01/2014   CLINICAL DATA:  Pain laterally after fall 2 days prior.  EXAM: LEFT ANKLE COMPLETE - 3+ VIEW  COMPARISON:  None.  FINDINGS: Frontal, oblique, and lateral views were obtained. There is a small calcification in the lateral malleolus which may represent a small acute avulsion. There is well corticated calcification more medial in location between the talus and lateral malleolus which probably represents residua of old trauma. No other areas of fracture. No effusion. Ankle mortise appears intact. There is a minimal posterior calcaneal spur.  IMPRESSION: Small probable acute avulsion along the just inferior to the lateral aspect of the lateral malleolus. Evidence of apparent old trauma more medially inferior to the lateral malleolus between the talus and lateral malleolus. No other fracture. Ankle mortise appears intact. No joint effusion.  These results will be called to the ordering clinician or representative by the Radiologist Assistant, and communication documented in the PACS or zVision Dashboard.   Electronically Signed   By: Lowella Grip M.D.   On: 11/01/2014 18:44     MDM   1. Pain   2. Injury   3. Avulsion fracture of left ankle, closed, initial  encounter   4. Contusion, knee, left, initial encounter    Treatment options  discussed, as well as risks, benefits, alternatives. Patient voiced understanding and agreement with the following plans: Encourage rest, ice, compression with ACE bandage, and elevation of injured body part. She declined any prescription pain medication, and she prefers ibuprofen 400 or 600 mg every 6-8 hours with food when necessary pain. Right ankle brace. Ace bandage right knee. Other symptomatic care discussed. Follow-up with your orthopedist in 2-3 days , or sooner if symptoms become worse. Precautions discussed. Red flags discussed. Questions invited and answered. Patient voiced understanding and agreement.   Jacqulyn Cane, MD 11/01/14 [phone]

## 2014-11-01 NOTE — ED Notes (Signed)
Pt c/o LT foot, LT lower leg and neck pain post fall on 10/30/14. She reports taking Advil 1630.

## 2014-11-01 NOTE — Progress Notes (Signed)
Anne [name]  [id number] [birth year] 11/01/2014      Progress Note-Follow Up  Subjective  Chief Complaint  Chief Complaint  Patient presents with  . Acute Visit    med refills.     HPI  Patient is a 73 y.o. female in today for routine medical care. She is here today with her husband and they report she is doing well. She has reestablished care with her previous oncologist Dr. Marko Plume is pleased. No recent concerns. Needs refills on her meds today. Denies CP/palp/SOB/HA/congestion/fevers/GI or GU c/o. Taking meds as prescribed  Past Medical History  Diagnosis Date  . Anemia   . COPD (chronic obstructive pulmonary disease)   . Hyperlipidemia   . Pneumonia 2-10    pleurisy  . Arthritis   . Depression   . GERD (gastroesophageal reflux disease) 09/29/2009    improved s/p cholecystectomy and esophagus dilatation  . History of chicken pox   . History of shingles     2 episodes  . History of measles   . Osteopenia 03/14/2011  . 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 Pure hypercholesterolemia 10/17/2010  . 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 ESOPHAGEAL STRICTURE 03/29/2009  . CAROTID ARTERY STENOSIS 01/13/2010  . ALKALINE PHOSPHATASE, ELEVATED 03/15/2009  . Anxiety and depression 04/28/2011  . Vaginitis 05/22/2011  . Baker's cyst of knee 05/22/2011  . Trauma 10/18/2011  . Radial neck fracture 10/2011    minimally displaced  . Atypical chest pain 11/30/2011  . Folliculitis of nose 5/95/6387  . Esophageal ring   . 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 Urinary incontinence 03/19/2012  . Allergic state 06/10/2012  . Hematuria   . Dysphagia   . URI (upper respiratory infection) 09/02/2012  . Preventative health care 10/21/2012  . Dermatitis 11/23/2012  . Fall 11/23/2012  . Hx of echocardiogram     a. Echo 01/2103: mild LVH, EF 55-60%, normal wall motion, Gr 1 diast dysfn  . Knee pain, bilateral 07/23/2011  . Elevated sed rate 08/02/2013    . EE (eosinophilic esophagitis)     Past Surgical History  Procedure Laterality Date  . Anterior cruciate ligament repair  08, 09, 10  . Ercp, cbd stone extraction  2010  . Cholecystectomy  2010  . Egd/ dili  2010, 2012  . Breast surgery  2001    lumpectomy (01)  . Colonoscopy  09/05/10    cecal avm's  . Esophagogastroduodenoscopy  01/08/2012    Procedure: ESOPHAGOGASTRODUODENOSCOPY (EGD);  Surgeon: Gatha Mayer, MD;  Location: [facility];  Service: Endoscopy;  Laterality: N/A;  . Savory dilation  01/08/2012    Procedure: SAVORY DILATION;  Surgeon: Gatha Mayer, MD;  Location: [facility];  Service: Endoscopy;  Laterality: N/A;  need xray  . Mastectomy modified radical      , Mastectomy modified radical (08), breast reconstruction, CA lesions excised lateral abd wall 2010  . Breast reconstructive surgery  2008, 2009, 2010    Family History  Problem Relation Age of Onset  . Heart disease Father   . Lung cancer Father     smoker  . Cirrhosis Sister     Primary Biliary  . Breast cancer Maternal Aunt     x 3  . Stroke Maternal Grandmother   . Alcohol abuse Maternal Grandfather   . Heart disease Paternal Grandfather   . Anxiety disorder Sister   . Osteoporosis Sister   .  Arthritis Sister     Rheumatoid  . Osteoporosis Sister   . Cancer Sister     multiple skin cancers, over 69 excisions.  . Other Mother     tic deleauroux  . Anesthesia problems Neg Hx   . Hypotension Neg Hx   . Malignant hyperthermia Neg Hx   . Pseudochol deficiency Neg Hx   . Colon cancer Neg Hx   . Ovarian cancer Neg Hx   . Pancreatic cancer Neg Hx   . Breast cancer Paternal Aunt     History   Social History  . Marital Status: Married    Spouse Name: N/A    Number of Children: N/A  . Years of Education: N/A   Occupational History  . Not on file.   Social History Main Topics  . Smoking status: Former Smoker    Types: Cigarettes    Quit date: 05/22/2007  . Smokeless tobacco: Never  Used  . Alcohol Use: 0.6 oz/week    1 Glasses of wine per week     Comment: at least 6 oz daily  . Drug Use: No  . Sexual Activity:    Partners: Male   Other Topics Concern  . Not on file   Social History Narrative    Current Outpatient Prescriptions on File Prior to Visit  Medication Sig Dispense Refill  . albuterol (PROVENTIL HFA;VENTOLIN HFA) 108 (90 BASE) MCG/ACT inhaler Inhale 2 puffs into the lungs every 4 (four) hours as needed for wheezing or shortness of breath. 18 g 0  . amitriptyline (ELAVIL) 150 MG tablet take 1 tablet by mouth at bedtime 30 tablet 2  . anastrozole (ARIMIDEX) 1 MG tablet Take 1 tablet (1 mg total) by mouth daily. 90 tablet 3  . aspirin 81 MG tablet Take 81 mg by mouth daily.      [name] atorvastatin (LIPITOR) 40 MG tablet Take 1 tablet (40 mg total) by mouth daily. 90 tablet 3  . cetirizine (ZYRTEC) 10 MG tablet Take 1 tablet (10 mg total) by mouth daily. For itch 15 tablet 0  . Cholecalciferol (VITAMIN D3) 1000 UNITS CAPS Take 2 tablets by mouth daily.      [name] loratadine (CLARITIN) 10 MG tablet Take 10 mg by mouth daily.    . pantoprazole (PROTONIX) 40 MG tablet Take 1 tablet (40 mg total) by mouth 2 (two) times daily. 180 tablet 3  . Probiotic Product (PROBIOTIC FORMULA PO) Take 1 capsule by mouth daily.    . vitamin B-12 (CYANOCOBALAMIN) 1000 MCG tablet Take 1,000 mcg by mouth daily.       No current facility-administered medications on file prior to visit.    Allergies  Allergen Reactions  . Codeine Other (See Comments)    "flu like symptoms"  . Diphenhydramine Hcl Other (See Comments)    "nervous and upset"  . Grapeseed Extract [Nutritional Supplements]     GRAPES  . Morphine Other (See Comments)    "flu like symptoms"  . Peanut-Containing Drug Products Hives  . Tomato Diarrhea  . Zofran Other (See Comments)    HEADACHE    Review of Systems  Review of Systems  Constitutional: Negative for fever and malaise/fatigue.  HENT: Negative for  congestion.   Eyes: Negative for discharge.  Respiratory: Negative for shortness of breath.   Cardiovascular: Negative for chest pain, palpitations and leg swelling.  Gastrointestinal: Negative for nausea, abdominal pain and diarrhea.  Genitourinary: Negative for dysuria.  Musculoskeletal: Negative for falls.  Skin: Negative for  rash.  Neurological: Negative for loss of consciousness and headaches.  Endo/Heme/Allergies: Negative for polydipsia.  Psychiatric/Behavioral: Negative for depression and suicidal ideas. The patient is not nervous/anxious and does not have insomnia.     Objective  BP 124/82 mmHg  Pulse 92  Temp(Src) 98.2 F (36.8 C)  Wt 132 lb (59.875 kg)  SpO2 97%  Physical Exam  Physical Exam  Constitutional: She is oriented to person, place, and time and well-developed, well-nourished, and in no distress. No distress.  HENT:  Head: Normocephalic and atraumatic.  Eyes: Conjunctivae are normal.  Neck: Neck supple. No thyromegaly present.  Cardiovascular: Normal rate, regular rhythm and normal heart sounds.   No murmur heard. Pulmonary/Chest: Effort normal and breath sounds normal. She has no wheezes.  Abdominal: She exhibits no distension and no mass.  Musculoskeletal: She exhibits no edema.  Lymphadenopathy:    She has no cervical adenopathy.  Neurological: She is alert and oriented to person, place, and time.  Skin: Skin is warm and dry. No rash noted. She is not diaphoretic.  Psychiatric: Memory, affect and judgment normal.    Lab Results  Component Value Date   TSH 2.80 07/16/2014   Lab Results  Component Value Date   WBC 9.1 10/18/2014   HGB 11.4* 10/18/2014   HCT 35.5 10/18/2014   MCV 96.2 10/18/2014   PLT 228 10/18/2014   Lab Results  Component Value Date   CREATININE 0.8 10/18/2014   BUN 12.7 10/18/2014   NA 141 10/18/2014   K 4.1 10/18/2014   CL 102 07/16/2014   CO2 29 10/18/2014   Lab Results  Component Value Date   ALT 14 10/18/2014    AST 15 10/18/2014   ALKPHOS 103 10/18/2014   BILITOT 0.42 10/18/2014   Lab Results  Component Value Date   CHOL 201* 01/13/2013   Lab Results  Component Value Date   HDL 65.70 01/13/2013   Lab Results  Component Value Date   LDLCALC 89 10/21/2012   Lab Results  Component Value Date   TRIG 158.0* 01/13/2013   Lab Results  Component Value Date   CHOLHDL 3 01/13/2013     Assessment & Plan  GERD Avoid offending foods, start probiotics. Do not eat large meals in late evening and consider raising head of bed. Is doing fairly well at present.  Breast cancer, right breast No recent concerns has reestablished with her previous oncologist, Dr Marko Plume and is very pleased.  Anxiety and depression Doing well today, no recent conflicts or difficulties reported today, she is here today with her husband  Pure hypercholesterolemia Tolerating statin, encouraged heart healthy diet, avoid trans fats, minimize simple carbs and saturated fats. Increase exercise as tolerated

## 2014-11-01 NOTE — Assessment & Plan Note (Signed)
Tolerating statin, encouraged heart healthy diet, avoid trans fats, minimize simple carbs and saturated fats. Increase exercise as tolerated 

## 2014-11-01 NOTE — Assessment & Plan Note (Signed)
No recent concerns has reestablished with her previous oncologist, Dr Marko Plume and is very pleased.

## 2014-11-03 ENCOUNTER — Telehealth: Payer: Self-pay | Admitting: *Deleted

## 2014-11-03 NOTE — Telephone Encounter (Signed)
PA for lidocaine patch initiated. Awaiting determination. JG//CMA

## 2014-11-22 NOTE — Telephone Encounter (Signed)
Approved effective 09/27/14 through 10/18/2015

## 2014-12-03 ENCOUNTER — Other Ambulatory Visit: Payer: Self-pay | Admitting: Family Medicine

## 2014-12-06 NOTE — Telephone Encounter (Signed)
Patient called in regarding this. Requesting medication to be sent.

## 2014-12-06 NOTE — Telephone Encounter (Signed)
Requesting Amitriptyline 150mg -Take 1 tablet by mouth at bedtime. Last refill:09/07/14;#30,2 Last OV:10/25/14 No UDS Please advise.//AB/CMA

## 2014-12-07 ENCOUNTER — Ambulatory Visit (INDEPENDENT_AMBULATORY_CARE_PROVIDER_SITE_OTHER): Payer: Commercial Managed Care - HMO | Admitting: Family Medicine

## 2014-12-07 ENCOUNTER — Encounter: Payer: Self-pay | Admitting: Family Medicine

## 2014-12-07 VITALS — BP 85/60 | HR 84 | Temp 97.9°F | Ht 66.0 in | Wt 131.6 lb

## 2014-12-07 DIAGNOSIS — I959 Hypotension, unspecified: Secondary | ICD-10-CM

## 2014-12-07 DIAGNOSIS — M25551 Pain in right hip: Secondary | ICD-10-CM

## 2014-12-07 DIAGNOSIS — K219 Gastro-esophageal reflux disease without esophagitis: Secondary | ICD-10-CM

## 2014-12-07 DIAGNOSIS — R32 Unspecified urinary incontinence: Secondary | ICD-10-CM | POA: Diagnosis not present

## 2014-12-07 DIAGNOSIS — R000 Tachycardia, unspecified: Secondary | ICD-10-CM

## 2014-12-07 DIAGNOSIS — Z79899 Other long term (current) drug therapy: Secondary | ICD-10-CM | POA: Diagnosis not present

## 2014-12-07 DIAGNOSIS — M25561 Pain in right knee: Secondary | ICD-10-CM

## 2014-12-07 DIAGNOSIS — E782 Mixed hyperlipidemia: Secondary | ICD-10-CM | POA: Diagnosis not present

## 2014-12-07 DIAGNOSIS — Z23 Encounter for immunization: Secondary | ICD-10-CM

## 2014-12-07 DIAGNOSIS — E7800 Pure hypercholesterolemia, unspecified: Secondary | ICD-10-CM

## 2014-12-07 DIAGNOSIS — R350 Frequency of micturition: Secondary | ICD-10-CM

## 2014-12-07 DIAGNOSIS — M7122 Synovial cyst of popliteal space [Baker], left knee: Secondary | ICD-10-CM | POA: Diagnosis not present

## 2014-12-07 LAB — COMPREHENSIVE METABOLIC PANEL
ALBUMIN: 4 g/dL (ref 3.5–5.2)
ALT: 15 U/L (ref 0–35)
AST: 16 U/L (ref 0–37)
Alkaline Phosphatase: 97 U/L (ref 39–117)
BUN: 16 mg/dL (ref 6–23)
CO2: 32 mEq/L (ref 19–32)
CREATININE: 0.74 mg/dL (ref 0.40–1.20)
Calcium: 9.7 mg/dL (ref 8.4–10.5)
Chloride: 101 mEq/L (ref 96–112)
GFR: 81.94 mL/min (ref >60.00)
Glucose, Bld: 67 mg/dL — ABNORMAL LOW (ref 70–99)
Potassium: 3.5 mEq/L (ref 3.5–5.1)
Sodium: 138 mEq/L (ref 135–145)
TOTAL PROTEIN: 7 g/dL (ref 6.0–8.3)
Total Bilirubin: 0.3 mg/dL (ref 0.2–1.2)

## 2014-12-07 LAB — CBC
HCT: 35.1 % — ABNORMAL LOW (ref 36.0–46.0)
Hemoglobin: 11.7 g/dL — ABNORMAL LOW (ref 12.0–15.0)
MCHC: 33.4 g/dL (ref 30.0–36.0)
MCV: 94.7 fl (ref 78.0–100.0)
PLATELETS: 306 10*3/uL (ref 150.0–400.0)
RBC: 3.71 Mil/uL — AB (ref 3.87–5.11)
RDW: 15 % (ref 11.5–15.5)
WBC: 6.4 10*3/uL (ref 4.0–10.5)

## 2014-12-07 LAB — LIPID PANEL
CHOL/HDL RATIO: 3
CHOLESTEROL: 192 mg/dL (ref 0–200)
HDL: 66.4 mg/dL (ref >39.00)
LDL CALC: 87 mg/dL (ref 0–99)
NonHDL: 125.6
Triglycerides: 193 mg/dL — ABNORMAL HIGH (ref 0.0–149.0)
VLDL: 38.6 mg/dL (ref 0.0–40.0)

## 2014-12-07 LAB — URINALYSIS, ROUTINE W REFLEX MICROSCOPIC
Bilirubin Urine: NEGATIVE
HGB URINE DIPSTICK: NEGATIVE
Ketones, ur: NEGATIVE
Nitrite: NEGATIVE
RBC / HPF: NONE SEEN (ref 0–?)
SPECIFIC GRAVITY, URINE: 1.01 (ref 1.000–1.030)
TOTAL PROTEIN, URINE-UPE24: NEGATIVE
Urine Glucose: NEGATIVE
Urobilinogen, UA: 0.2 (ref 0.0–1.0)
pH: 6 (ref 5.0–8.0)

## 2014-12-07 LAB — TSH: TSH: 3.02 u[IU]/mL (ref 0.35–4.50)

## 2014-12-07 MED ORDER — LIDOCAINE 5 % EX PTCH: 2.0000 | MEDICATED_PATCH | Freq: Two times a day (BID) | CUTANEOUS | Status: DC

## 2014-12-07 NOTE — Patient Instructions (Signed)
Prevnar (PCV13) will insurance and in one year (6 months) will they pay for the other pneumonia shot (PCV23)?   Knee Pain The knee is the complex joint between your thigh and your lower leg. It is made up of bones, tendons, ligaments, and cartilage. The bones that make up the knee are:  The femur in the thigh.  The tibia and fibula in the lower leg.  The patella or kneecap riding in the groove on the lower femur. CAUSES  Knee pain is a common complaint with many causes. A few of these causes are:  Injury, such as:  A ruptured ligament or tendon injury.  Torn cartilage.  Medical conditions, such as:  Gout  Arthritis  Infections  Overuse, over training, or overdoing a physical activity. Knee pain can be minor or severe. Knee pain can accompany debilitating injury. Minor knee problems often respond well to self-care measures or get well on their own. More serious injuries may need medical intervention or even surgery. SYMPTOMS The knee is complex. Symptoms of knee problems can vary widely. Some of the problems are:  Pain with movement and weight bearing.  Swelling and tenderness.  Buckling of the knee.  Inability to straighten or extend your knee.  Your knee locks and you cannot straighten it.  Warmth and redness with pain and fever.  Deformity or dislocation of the kneecap. DIAGNOSIS  Determining what is wrong may be very straight forward such as when there is an injury. It can also be challenging because of the complexity of the knee. Tests to make a diagnosis may include:  Your caregiver taking a history and doing a physical exam.  Routine X-rays can be used to rule out other problems. X-rays will not reveal a cartilage tear. Some injuries of the knee can be diagnosed by:  Arthroscopy a surgical technique by which a small video camera is inserted through tiny incisions on the sides of the knee. This procedure is used to examine and repair internal knee joint  problems. Tiny instruments can be used during arthroscopy to repair the torn knee cartilage (meniscus).  Arthrography is a radiology technique. A contrast liquid is directly injected into the knee joint. Internal structures of the knee joint then become visible on X-ray film.  An MRI scan is a non X-ray radiology procedure in which magnetic fields and a computer produce two- or three-dimensional images of the inside of the knee. Cartilage tears are often visible using an MRI scanner. MRI scans have largely replaced arthrography in diagnosing cartilage tears of the knee.  Blood work.  Examination of the fluid that helps to lubricate the knee joint (synovial fluid). This is done by taking a sample out using a needle and a syringe. TREATMENT The treatment of knee problems depends on the cause. Some of these treatments are:  Depending on the injury, proper casting, splinting, surgery, or physical therapy care will be needed.  Give yourself adequate recovery time. Do not overuse your joints. If you begin to get sore during workout routines, back off. Slow down or do fewer repetitions.  For repetitive activities such as cycling or running, maintain your strength and nutrition.  Alternate muscle groups. For example, if you are a weight lifter, work the upper body on one day and the lower body the next.  Either tight or weak muscles do not give the proper support for your knee. Tight or weak muscles do not absorb the stress placed on the knee joint. Keep the muscles surrounding  the knee strong.  Take care of mechanical problems.  If you have flat feet, orthotics or special shoes may help. See your caregiver if you need help.  Arch supports, sometimes with wedges on the inner or outer aspect of the heel, can help. These can shift pressure away from the side of the knee most bothered by osteoarthritis.  A brace called an "unloader" brace also may be used to help ease the pressure on the most  arthritic side of the knee.  If your caregiver has prescribed crutches, braces, wraps or ice, use as directed. The acronym for this is PRICE. This means protection, rest, ice, compression, and elevation.  Nonsteroidal anti-inflammatory drugs (NSAIDs), can help relieve pain. But if taken immediately after an injury, they may actually increase swelling. Take NSAIDs with food in your stomach. Stop them if you develop stomach problems. Do not take these if you have a history of ulcers, stomach pain, or bleeding from the bowel. Do not take without your caregiver's approval if you have problems with fluid retention, heart failure, or kidney problems.  For ongoing knee problems, physical therapy may be helpful.  Glucosamine and chondroitin are over-the-counter dietary supplements. Both may help relieve the pain of osteoarthritis in the knee. These medicines are different from the usual anti-inflammatory drugs. Glucosamine may decrease the rate of cartilage destruction.  Injections of a corticosteroid drug into your knee joint may help reduce the symptoms of an arthritis flare-up. They may provide pain relief that lasts a few months. You may have to wait a few months between injections. The injections do have a small increased risk of infection, water retention, and elevated blood sugar levels.  Hyaluronic acid injected into damaged joints may ease pain and provide lubrication. These injections may work by reducing inflammation. A series of shots may give relief for as long as 6 months.  Topical painkillers. Applying certain ointments to your skin may help relieve the pain and stiffness of osteoarthritis. Ask your pharmacist for suggestions. Many over the-counter products are approved for temporary relief of arthritis pain.  In some countries, doctors often prescribe topical NSAIDs for relief of chronic conditions such as arthritis and tendinitis. A review of treatment with NSAID creams found that they  worked as well as oral medications but without the serious side effects. PREVENTION  Maintain a healthy weight. Extra pounds put more strain on your joints.  Get strong, stay limber. Weak muscles are a common cause of knee injuries. Stretching is important. Include flexibility exercises in your workouts.  Be smart about exercise. If you have osteoarthritis, chronic knee pain or recurring injuries, you may need to change the way you exercise. This does not mean you have to stop being active. If your knees ache after jogging or playing basketball, consider switching to swimming, water aerobics, or other low-impact activities, at least for a few days a week. Sometimes limiting high-impact activities will provide relief.  Make sure your shoes fit well. Choose footwear that is right for your sport.  Protect your knees. Use the proper gear for knee-sensitive activities. Use kneepads when playing volleyball or laying carpet. Buckle your seat belt every time you drive. Most shattered kneecaps occur in car accidents.  Rest when you are tired. SEEK MEDICAL CARE IF:  You have knee pain that is continual and does not seem to be getting better.  SEEK IMMEDIATE MEDICAL CARE IF:  Your knee joint feels hot to the touch and you have a high fever. MAKE SURE  YOU:   Understand these instructions.  Will watch your condition.  Will get help right away if you are not doing well or get worse. Document Released: 08/26/2007 Document Revised: 01/21/2012 Document Reviewed: 08/26/2007 [facility] Patient Information 2015 [place], [place]. This information is not intended to replace advice given to you by your health care provider. Make sure you discuss any questions you have with your health care provider.

## 2014-12-07 NOTE — Progress Notes (Signed)
Patient ID: Anne [name], female   DOB: 11/04/1941, 74 y.o.   MRN: [id number]   Anne [name]  [id number] [birth year] 12/07/2014      Progress Note-Follow Up  Subjective  Chief Complaint  Chief Complaint  Patient presents with  . Knee Pain    (R)-has Baker's Cyst    HPI  Patient is a 74 y.o. female in today for routine medical care. 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 Her knee has become more painful. Denies any injury but has anterior knee pain with pain radiating down shin. No redness, warmth or swelling. Denies CP/palp/SOB/HA/congestion/fevers/GI or GU c/o. Taking meds as prescribed  Past Medical History  Diagnosis Date  . Anemia   . COPD (chronic obstructive pulmonary disease)   . Hyperlipidemia   . Pneumonia 2-10    pleurisy  . Arthritis   . Depression   . GERD (gastroesophageal reflux disease) 09/29/2009    improved s/p cholecystectomy and esophagus dilatation  . History of chicken pox   . History of shingles     2 episodes  . History of measles   . Osteopenia 03/14/2011  . 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 Pure hypercholesterolemia 10/17/2010  . 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 ESOPHAGEAL STRICTURE 03/29/2009  . CAROTID ARTERY STENOSIS 01/13/2010  . ALKALINE PHOSPHATASE, ELEVATED 03/15/2009  . Anxiety and depression 04/28/2011  . Vaginitis 05/22/2011  . Baker's cyst of knee 05/22/2011  . Trauma 10/18/2011  . Radial neck fracture 10/2011    minimally displaced  . Atypical chest pain 11/30/2011  . Folliculitis of nose 1/95/0932  . Esophageal ring   . 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 Urinary incontinence 03/19/2012  . Allergic state 06/10/2012  . Hematuria   . Dysphagia   . URI (upper respiratory infection) 09/02/2012  . Preventative health care 10/21/2012  . Dermatitis 11/23/2012  . Fall 11/23/2012  . Hx of echocardiogram     a. Echo 01/2103: mild  LVH, EF 55-60%, normal wall motion, Gr 1 diast dysfn  . Knee pain, bilateral 07/23/2011  . Elevated sed rate 08/02/2013  . EE (eosinophilic esophagitis)     Past Surgical History  Procedure Laterality Date  . Anterior cruciate ligament repair  08, 09, 10  . Ercp, cbd stone extraction  2010  . Cholecystectomy  2010  . Egd/ dili  2010, 2012  . Breast surgery  2001    lumpectomy (01)  . Colonoscopy  09/05/10    cecal avm's  . Esophagogastroduodenoscopy  01/08/2012    Procedure: ESOPHAGOGASTRODUODENOSCOPY (EGD);  Surgeon: Gatha Mayer, MD;  Location: [facility];  Service: Endoscopy;  Laterality: N/A;  . Savory dilation  01/08/2012    Procedure: SAVORY DILATION;  Surgeon: Gatha Mayer, MD;  Location: [facility];  Service: Endoscopy;  Laterality: N/A;  need xray  . Mastectomy modified radical      , Mastectomy modified radical (08), breast reconstruction, CA lesions excised lateral abd wall 2010  . Breast reconstructive surgery  2008, 2009, 2010    Family History  Problem Relation Age of Onset  . Heart disease Father   . Lung cancer Father     smoker  . Cirrhosis Sister     Primary Biliary  . Breast cancer Maternal Aunt     x 3  . Stroke Maternal Grandmother   . Alcohol abuse  Maternal Grandfather   . Heart disease Paternal Grandfather   . Anxiety disorder Sister   . Osteoporosis Sister   . Arthritis Sister     Rheumatoid  . Osteoporosis Sister   . Cancer Sister     multiple skin cancers, over 5 excisions.  . Other Mother     tic deleauroux  . Anesthesia problems Neg Hx   . Hypotension Neg Hx   . Malignant hyperthermia Neg Hx   . Pseudochol deficiency Neg Hx   . Colon cancer Neg Hx   . Ovarian cancer Neg Hx   . Pancreatic cancer Neg Hx   . Breast cancer Paternal Aunt     History   Social History  . Marital Status: Married    Spouse Name: N/A    Number of Children: N/A  . Years of Education: N/A   Occupational History  . Not on file.   Social History  Main Topics  . Smoking status: Former Smoker    Types: Cigarettes    Quit date: 05/22/2007  . Smokeless tobacco: Never Used  . Alcohol Use: 0.6 oz/week    1 Glasses of wine per week     Comment: at least 6 oz daily  . Drug Use: No  . Sexual Activity:    Partners: Male   Other Topics Concern  . Not on file   Social History Narrative    Current Outpatient Prescriptions on File Prior to Visit  Medication Sig Dispense Refill  . albuterol (PROVENTIL HFA;VENTOLIN HFA) 108 (90 BASE) MCG/ACT inhaler Inhale 2 puffs into the lungs every 4 (four) hours as needed for wheezing or shortness of breath. 18 g 0  . amitriptyline (ELAVIL) 150 MG tablet take 1 tablet by mouth at bedtime 30 tablet 2  . aspirin 81 MG tablet Take 81 mg by mouth daily.      [name] atorvastatin (LIPITOR) 40 MG tablet Take 1 tablet (40 mg total) by mouth daily. 90 tablet 3  . Cholecalciferol (VITAMIN D3) 1000 UNITS CAPS Take 2 tablets by mouth daily.      . hyoscyamine (LEVSIN SL) 0.125 MG SL tablet Place 1 tablet (0.125 mg total) under the tongue every 4 (four) hours as needed. 30 tablet 2  . loratadine (CLARITIN) 10 MG tablet Take 10 mg by mouth daily.    [name] LORazepam (ATIVAN) 1 MG tablet Take 1 tablet (1 mg total) by mouth every 8 (eight) hours as needed for anxiety. 70 tablet 2  . pantoprazole (PROTONIX) 40 MG tablet Take 1 tablet (40 mg total) by mouth 2 (two) times daily. 180 tablet 3  . Probiotic Product (PROBIOTIC FORMULA PO) Take 1 capsule by mouth daily.    . vitamin B-12 (CYANOCOBALAMIN) 1000 MCG tablet Take 1,000 mcg by mouth daily.       No current facility-administered medications on file prior to visit.    Allergies  Allergen Reactions  . Codeine Other (See Comments)    "flu like symptoms"  . Diphenhydramine Hcl Other (See Comments)    "nervous and upset"  . Grapeseed Extract [Nutritional Supplements]     GRAPES  . Morphine Other (See Comments)    "flu like symptoms"  . Peanut-Containing Drug Products  Hives  . Tomato Diarrhea  . Zofran Other (See Comments)    HEADACHE    Review of Systems  Review of Systems  Constitutional: Negative for fever and malaise/fatigue.  HENT: Negative for congestion.   Eyes: Negative for discharge.  Respiratory: Negative for shortness  of breath.   Cardiovascular: Negative for chest pain, palpitations and leg swelling.  Gastrointestinal: Negative for nausea, abdominal pain and diarrhea.  Genitourinary: Negative for dysuria.  Musculoskeletal: Positive for joint pain. Negative for falls.       Right knee pain worsening, now with increasing anterior pain. Baker's Cyst is symptomatic and painful starting in am and this is ongoing but the anterior pain is new. Worsens throughout the day. Posterior knee has sweeling, anterior knee does not.   Skin: Negative for rash.  Neurological: Negative for loss of consciousness and headaches.  Endo/Heme/Allergies: Negative for polydipsia.  Psychiatric/Behavioral: Negative for depression and suicidal ideas. The patient is not nervous/anxious and does not have insomnia.     Objective  BP 85/60 mmHg  Pulse 95  Temp(Src) 97.9 F (36.6 C) (Oral)  Ht 5\' 6"  (1.676 m)  Wt 131 lb 9.6 oz (59.693 kg)  BMI 21.25 kg/m2  SpO2 97%  Physical Exam  Physical Exam  Lab Results  Component Value Date   TSH 2.80 07/16/2014   Lab Results  Component Value Date   WBC 9.1 10/18/2014   HGB 11.4* 10/18/2014   HCT 35.5 10/18/2014   MCV 96.2 10/18/2014   PLT 228 10/18/2014   Lab Results  Component Value Date   CREATININE 0.8 10/18/2014   BUN 12.7 10/18/2014   NA 141 10/18/2014   K 4.1 10/18/2014   CL 102 07/16/2014   CO2 29 10/18/2014   Lab Results  Component Value Date   ALT 14 10/18/2014   AST 15 10/18/2014   ALKPHOS 103 10/18/2014   BILITOT 0.42 10/18/2014   Lab Results  Component Value Date   CHOL 201* 01/13/2013   Lab Results  Component Value Date   HDL 65.70 01/13/2013   Lab Results  Component Value  Date   LDLCALC 89 10/21/2012   Lab Results  Component Value Date   TRIG 158.0* 01/13/2013   Lab Results  Component Value Date   CHOLHDL 3 01/13/2013     Assessment & Plan  GERD Avoid offending foods, start probiotics. Do not eat large meals in late evening and consider raising head of bed.    Baker's cyst of knee Cyst persists but now with increased pain in anterior knee with pain radiating down left shin, is referred to ortho for further consideration. Try ice and Salon Pas gel prn   Tachycardia Mild elevation initially today. Improved on recheck   Pure hypercholesterolemia Tolerating statin, encouraged heart healthy diet, avoid trans fats, minimize simple carbs and saturated fats. Increase exercise as tolerated   Urinary incontinence Check UA

## 2014-12-07 NOTE — Progress Notes (Signed)
Pre visit review using our clinic review tool, if applicable. No additional management support is needed unless otherwise documented below in the visit note. 

## 2014-12-12 ENCOUNTER — Encounter: Payer: Self-pay | Admitting: Family Medicine

## 2014-12-12 DIAGNOSIS — R350 Frequency of micturition: Secondary | ICD-10-CM | POA: Insufficient documentation

## 2014-12-12 NOTE — Assessment & Plan Note (Signed)
Cyst persists but now with increased pain in anterior knee with pain radiating down left shin, is referred to ortho for further consideration. Try ice and Salon Pas gel prn

## 2014-12-12 NOTE — Assessment & Plan Note (Signed)
Avoid offending foods, start probiotics. Do not eat large meals in late evening and consider raising head of bed.  

## 2014-12-12 NOTE — Assessment & Plan Note (Signed)
Tolerating statin, encouraged heart healthy diet, avoid trans fats, minimize simple carbs and saturated fats. Increase exercise as tolerated 

## 2014-12-12 NOTE — Assessment & Plan Note (Addendum)
Mild elevation initially today. Improved on recheck

## 2014-12-12 NOTE — Assessment & Plan Note (Signed)
Check UA 

## 2014-12-23 DIAGNOSIS — M25561 Pain in right knee: Secondary | ICD-10-CM | POA: Diagnosis not present

## 2014-12-30 ENCOUNTER — Telehealth: Payer: Self-pay | Admitting: Oncology

## 2014-12-30 NOTE — Telephone Encounter (Signed)
, °

## 2015-01-13 ENCOUNTER — Telehealth: Payer: Self-pay | Admitting: Family Medicine

## 2015-01-13 NOTE — Telephone Encounter (Signed)
Called the patient per PCP instructions to inform UDS results from [facility] were positive for alcohol.  Informed the patient if tested positive at next UDS urine test PCP would not keep her on lorazepam.  The patient did verbalize an understanding of instructions.

## 2015-01-20 ENCOUNTER — Other Ambulatory Visit: Payer: Self-pay

## 2015-01-20 DIAGNOSIS — Z1231 Encounter for screening mammogram for malignant neoplasm of breast: Secondary | ICD-10-CM

## 2015-02-03 DIAGNOSIS — M2391 Unspecified internal derangement of right knee: Secondary | ICD-10-CM | POA: Diagnosis not present

## 2015-02-03 DIAGNOSIS — M25561 Pain in right knee: Secondary | ICD-10-CM | POA: Diagnosis not present

## 2015-02-17 ENCOUNTER — Ambulatory Visit: Payer: Commercial Managed Care - HMO

## 2015-02-19 ENCOUNTER — Other Ambulatory Visit: Payer: Self-pay | Admitting: Family Medicine

## 2015-02-21 ENCOUNTER — Telehealth: Payer: Self-pay | Admitting: *Deleted

## 2015-02-21 NOTE — Telephone Encounter (Signed)
Refill faxed already

## 2015-02-21 NOTE — Telephone Encounter (Signed)
Last OV 12/07/14 Next OV 06/10/15 Last Refill 10/25/14 #70 with 2 Refills.

## 2015-02-21 NOTE — Telephone Encounter (Signed)
I refilled

## 2015-02-21 NOTE — Telephone Encounter (Signed)
Faxed hardcopy for Lorazepam #70 with 2 refills to [facility] [place]

## 2015-02-21 NOTE — Telephone Encounter (Signed)
rx refill- lorazepam 1 mg  Last OV- 12/07/14 Last refilled- 10/25/14 #70 / 2 rf  UDS- 12/07/14 Moderate risk.

## 2015-02-24 ENCOUNTER — Ambulatory Visit
Admission: RE | Admit: 2015-02-24 | Discharge: 2015-02-24 | Disposition: A | Discharge status: Home / Self Care | Payer: Commercial Managed Care - HMO | Source: Ambulatory Visit

## 2015-02-24 DIAGNOSIS — Z1231 Encounter for screening mammogram for malignant neoplasm of breast: Secondary | ICD-10-CM

## 2015-03-03 ENCOUNTER — Other Ambulatory Visit: Payer: Self-pay | Admitting: Family Medicine

## 2015-03-03 NOTE — Telephone Encounter (Signed)
Last Office Visit 12/06/14 Last refill 12/06/14  #30 with 2 refills.

## 2015-03-04 ENCOUNTER — Ambulatory Visit (INDEPENDENT_AMBULATORY_CARE_PROVIDER_SITE_OTHER): Payer: Commercial Managed Care - HMO | Admitting: Family Medicine

## 2015-03-04 ENCOUNTER — Encounter: Payer: Self-pay | Admitting: Family Medicine

## 2015-03-04 VITALS — BP 96/68 | HR 68 | Temp 97.8°F | Resp 16 | Ht 66.0 in | Wt 132.0 lb

## 2015-03-04 DIAGNOSIS — D649 Anemia, unspecified: Secondary | ICD-10-CM

## 2015-03-04 DIAGNOSIS — R000 Tachycardia, unspecified: Secondary | ICD-10-CM

## 2015-03-04 DIAGNOSIS — F1021 Alcohol dependence, in remission: Secondary | ICD-10-CM | POA: Diagnosis not present

## 2015-03-04 DIAGNOSIS — M25562 Pain in left knee: Secondary | ICD-10-CM

## 2015-03-04 DIAGNOSIS — M25561 Pain in right knee: Secondary | ICD-10-CM | POA: Diagnosis not present

## 2015-03-04 DIAGNOSIS — E782 Mixed hyperlipidemia: Secondary | ICD-10-CM

## 2015-03-04 DIAGNOSIS — R109 Unspecified abdominal pain: Secondary | ICD-10-CM

## 2015-03-04 LAB — CBC
HEMATOCRIT: 35.1 % — AB (ref 36.0–46.0)
Hemoglobin: 11.9 g/dL — ABNORMAL LOW (ref 12.0–15.0)
MCHC: 33.8 g/dL (ref 30.0–36.0)
MCV: 93.2 fl (ref 78.0–100.0)
Platelets: 285 10*3/uL (ref 150.0–400.0)
RBC: 3.77 Mil/uL — ABNORMAL LOW (ref 3.87–5.11)
RDW: 14.4 % (ref 11.5–15.5)
WBC: 4.6 10*3/uL (ref 4.0–10.5)

## 2015-03-04 MED ORDER — LORAZEPAM 1 MG PO TABS: ORAL_TABLET | ORAL | Status: DC

## 2015-03-04 MED ORDER — HYOSCYAMINE SULFATE 0.125 MG SL SUBL: 0.1250 mg | SUBLINGUAL_TABLET | SUBLINGUAL | Status: DC | PRN

## 2015-03-04 MED ORDER — AMITRIPTYLINE HCL 150 MG PO TABS: 150.0000 mg | ORAL_TABLET | Freq: Every day | ORAL | Status: DC

## 2015-03-04 NOTE — Patient Instructions (Signed)
Flush nose with nasal saline, continue Claritin daily, add flonase if symptoms persisit   Allergies Allergies may happen from anything your body is sensitive to. This may be food, medicines, pollens, chemicals, and nearly anything around you in everyday life that produces allergens. An allergen is anything that causes an allergy producing substance. Heredity is often a factor in causing these problems. This means you may have some of the same allergies as your parents. Food allergies happen in all age groups. Food allergies are some of the most severe and life threatening. Some common food allergies are cow's milk, seafood, eggs, nuts, wheat, and soybeans. SYMPTOMS   Swelling around the mouth.  An itchy red rash or hives.  Vomiting or diarrhea.  Difficulty breathing. SEVERE ALLERGIC REACTIONS ARE LIFE-THREATENING. This reaction is called anaphylaxis. It can cause the mouth and throat to swell and cause difficulty with breathing and swallowing. In severe reactions only a trace amount of food (for example, peanut oil in a salad) may cause death within seconds. Seasonal allergies occur in all age groups. These are seasonal because they usually occur during the same season every year. They may be a reaction to molds, grass pollens, or tree pollens. Other causes of problems are house dust mite allergens, pet dander, and mold spores. The symptoms often consist of nasal congestion, a runny itchy nose associated with sneezing, and tearing itchy eyes. There is often an associated itching of the mouth and ears. The problems happen when you come in contact with pollens and other allergens. Allergens are the particles in the air that the body reacts to with an allergic reaction. This causes you to release allergic antibodies. Through a chain of events, these eventually cause you to release histamine into the blood stream. Although it is meant to be protective to the body, it is this release that causes your  discomfort. This is why you were given anti-histamines to feel better. If you are unable to pinpoint the offending allergen, it may be determined by skin or blood testing. Allergies cannot be cured but can be controlled with medicine. Hay fever is a collection of all or some of the seasonal allergy problems. It may often be treated with simple over-the-counter medicine such as diphenhydramine. Take medicine as directed. Do not drink alcohol or drive while taking this medicine. Check with your caregiver or package insert for child dosages. If these medicines are not effective, there are many new medicines your caregiver can prescribe. Stronger medicine such as nasal spray, eye drops, and corticosteroids may be used if the first things you try do not work well. Other treatments such as immunotherapy or desensitizing injections can be used if all else fails. Follow up with your caregiver if problems continue. These seasonal allergies are usually not life threatening. They are generally more of a nuisance that can often be handled using medicine. HOME CARE INSTRUCTIONS   If unsure what causes a reaction, keep a diary of foods eaten and symptoms that follow. Avoid foods that cause reactions.  If hives or rash are present:  Take medicine as directed.  You may use an over-the-counter antihistamine (diphenhydramine) for hives and itching as needed.  Apply cold compresses (cloths) to the skin or take baths in cool water. Avoid hot baths or showers. Heat will make a rash and itching worse.  If you are severely allergic:  Following a treatment for a severe reaction, hospitalization is often required for closer follow-up.  Wear a medic-alert bracelet or necklace stating  the allergy.  You and your family must learn how to give adrenaline or use an anaphylaxis kit.  If you have had a severe reaction, always carry your anaphylaxis kit or EpiPen with you. Use this medicine as directed by your caregiver if a  severe reaction is occurring. Failure to do so could have a fatal outcome. SEEK MEDICAL CARE IF:  You suspect a food allergy. Symptoms generally happen within 30 minutes of eating a food.  Your symptoms have not gone away within 2 days or are getting worse.  You develop new symptoms.  You want to retest yourself or your child with a food or drink you think causes an allergic reaction. Never do this if an anaphylactic reaction to that food or drink has happened before. Only do this under the care of a caregiver. SEEK IMMEDIATE MEDICAL CARE IF:   You have difficulty breathing, are wheezing, or have a tight feeling in your chest or throat.  You have a swollen mouth, or you have hives, swelling, or itching all over your body.  You have had a severe reaction that has responded to your anaphylaxis kit or an EpiPen. These reactions may return when the medicine has worn off. These reactions should be considered life threatening. MAKE SURE YOU:   Understand these instructions.  Will watch your condition.  Will get help right away if you are not doing well or get worse. Document Released: 01/22/2003 Document Revised: 02/23/2013 Document Reviewed: 06/28/2008 [facility] Patient Information 2015 [place], [place]. This information is not intended to replace advice given to you by your health care provider. Make sure you discuss any questions you have with your health care provider.

## 2015-03-04 NOTE — Progress Notes (Signed)
Pre visit review using our clinic review tool, if applicable. No additional management support is needed unless otherwise documented below in the visit note. 

## 2015-03-08 ENCOUNTER — Telehealth: Payer: Self-pay | Admitting: *Deleted

## 2015-03-08 NOTE — Telephone Encounter (Signed)
Patient's husband called stating that she has had low energy lately, just wants to lay around.  Patient does not complain of any pain or dizziness, no urinary symptoms, no complaints at all except for feeling tired.  Husband was concerned about anemia, notified him based on labs that she was anemic but improving, higher than her normal.  Husband would like to know if there is any other explanation or whether they should schedule follow-up (they were here 03/04/15 for med check).

## 2015-03-08 NOTE — Telephone Encounter (Signed)
So my concern is that her fatigue is either multifactorial or related to low mood which she has struggled with in past. No obvious source of fatigue found in labwork. Is she noting any weakness, sob or cp? If so could order echo. Have her schedule appt in about a month to follow up.

## 2015-03-11 NOTE — Telephone Encounter (Signed)
Called patient to follow up and left message for patient to return call Monday when available.

## 2015-03-13 ENCOUNTER — Encounter: Payer: Self-pay | Admitting: Family Medicine

## 2015-03-13 NOTE — Assessment & Plan Note (Signed)
Tolerating statin, encouraged heart healthy diet, avoid trans fats, minimize simple carbs and saturated fats. Increase exercise as tolerated 

## 2015-03-13 NOTE — Assessment & Plan Note (Signed)
Resolved at present, likely related to previous alcohol use

## 2015-03-13 NOTE — Assessment & Plan Note (Signed)
Will continue with Dr Alvan Dame, has responded to cortisone injections. May use Tylenol and salon pas prn

## 2015-03-13 NOTE — Progress Notes (Signed)
Anne [name]  [id number] 05/21/1941 03/13/2015      Progress Note-Follow Up  Subjective  Chief Complaint  Chief Complaint  Patient presents with  . Follow-up    No concerns    HPI  Patient is a 74 y.o. female in today for routine medical care. She is in today accompanied by her husband and doing well. No recent illness. She quit alcohol a couple of months ago and feels well. Continues to struggle with some intermittent knee pain but finds it tolerable at the present time. Is following with orthopedics and responds to occasional steroid injections. Denies CP/palp/SOB/HA/congestion/fevers/GI or GU c/o. Taking meds as prescribed  Past Medical History  Diagnosis Date  . Anemia   . COPD (chronic obstructive pulmonary disease)   . Hyperlipidemia   . Pneumonia 2-10    pleurisy  . Arthritis   . Depression   . GERD (gastroesophageal reflux disease) 09/29/2009    improved s/p cholecystectomy and esophagus dilatation  . History of chicken pox   . History of shingles     2 episodes  . History of measles   . Osteopenia 03/14/2011  . 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 Pure hypercholesterolemia 10/17/2010  . 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 ESOPHAGEAL STRICTURE 03/29/2009  . CAROTID ARTERY STENOSIS 01/13/2010  . ALKALINE PHOSPHATASE, ELEVATED 03/15/2009  . Anxiety and depression 04/28/2011  . Vaginitis 05/22/2011  . Baker's cyst of knee 05/22/2011  . Trauma 10/18/2011  . Radial neck fracture 10/2011    minimally displaced  . Atypical chest pain 11/30/2011  . Folliculitis of nose 0/97/3532  . Esophageal ring   . 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 Urinary incontinence 03/19/2012  . Allergic state 06/10/2012  . Hematuria   . Dysphagia   . URI (upper respiratory infection) 09/02/2012  . Preventative health care 10/21/2012  . Dermatitis 11/23/2012  . Fall 11/23/2012  . Hx of echocardiogram     a. Echo 01/2103: mild LVH, EF 55-60%,  normal wall motion, Gr 1 diast dysfn  . Knee pain, bilateral 07/23/2011  . Elevated sed rate 08/02/2013  . EE (eosinophilic esophagitis)   . Urinary frequency 12/12/2014  . 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    . Mixed hyperlipidemia 10/17/2010    Qualifier: Diagnosis of  By: Mack Guise      Past Surgical History  Procedure Laterality Date  . Anterior cruciate ligament repair  08, 09, 10  . Ercp, cbd stone extraction  2010  . Cholecystectomy  2010  . Egd/ dili  2010, 2012  . Breast surgery  2001    lumpectomy (01)  . Colonoscopy  09/05/10    [facility]cal av[facility]'s  . Esophagogastroduodenoscopy  01/08/2012    Procedure: ESOPHAGOGASTRODUODENOSCOPY (EGD);  Surgeon: Gatha Mayer, MD;  Location: [facility];  Service: Endoscopy;  Laterality: N/A;  . Savory dilation  01/08/2012    Procedure: SAVORY DILATION;  Surgeon: Gatha Mayer, MD;  Location: [facility];  Service: Endoscopy;  Laterality: N/A;  need xray  . Mastectomy modified radical      , Mastectomy modified radical (08), breast reconstruction, CA lesions excised lateral abd wall 2010  . Breast reconstructive surgery  2008, 2009, 2010    Family History  Problem Relation Age of Onset  . Heart disease Father   . Lung cancer Father  smoker  . Cirrhosis Sister     Primary Biliary  . Breast cancer Maternal Aunt     x 3  . Stroke Maternal Grandmother   . Alcohol abuse Maternal Grandfather   . Heart disease Paternal Grandfather   . Anxiety disorder Sister   . Osteoporosis Sister   . Arthritis Sister     Rheumatoid  . Osteoporosis Sister   . Cancer Sister     multiple skin cancers, over 33 excisions.  . Other Mother     tic deleauroux  . Anesthesia problems Neg Hx   . Hypotension Neg Hx   . Malignant hyperthermia Neg Hx   . Pseudochol deficiency Neg Hx   . Colon cancer Neg Hx   . Ovarian cancer Neg Hx   . Pancreatic cancer Neg Hx   . Breast  cancer Paternal Aunt     History   Social History  . Marital Status: Married    Spouse Name: N/A  . Number of Children: N/A  . Years of Education: N/A   Occupational History  . Not on file.   Social History Main Topics  . Smoking status: Former Smoker    Types: Cigarettes    Quit date: 05/22/2007  . Smokeless tobacco: Never Used  . Alcohol Use: 0.6 oz/week    1 Glasses of wine per week     Comment: at least 6 oz daily  . Drug Use: No  . Sexual Activity:    Partners: Male   Other Topics Concern  . Not on file   Social History Narrative    Current Outpatient Prescriptions on File Prior to Visit  Medication Sig Dispense Refill  . albuterol (PROVENTIL HFA;VENTOLIN HFA) 108 (90 BASE) MCG/ACT inhaler Inhale 2 puffs into the lungs every 4 (four) hours as needed for wheezing or shortness of breath. 18 g 0  . aspirin 81 MG tablet Take 81 mg by mouth daily.      [name] atorvastatin (LIPITOR) 40 MG tablet Take 1 tablet (40 mg total) by mouth daily. 90 tablet 3  . Cholecalciferol (VITAMIN D3) 1000 UNITS CAPS Take 2 tablets by mouth daily.      [name] loratadine (CLARITIN) 10 MG tablet Take 10 mg by mouth daily.    . pantoprazole (PROTONIX) 40 MG tablet Take 1 tablet (40 mg total) by mouth 2 (two) times daily. 180 tablet 3  . Probiotic Product (PROBIOTIC FORMULA PO) Take 1 capsule by mouth daily.    . vitamin B-12 (CYANOCOBALAMIN) 1000 MCG tablet Take 1,000 mcg by mouth daily.       No current facility-administered medications on file prior to visit.    Allergies  Allergen Reactions  . Codeine Other (See Comments)    "flu like symptoms"  . Diphenhydramine Hcl Other (See Comments)    "nervous and upset"  . Grapeseed Extract [Nutritional Supplements]     GRAPES  . Morphine Other (See Comments)    "flu like symptoms"  . Peanut-Containing Drug Products Hives  . Tomato Diarrhea  . Zofran Other (See Comments)    HEADACHE    Review of Systems  Review of Systems  Constitutional:  Negative for fever and malaise/fatigue.  HENT: Negative for congestion.   Eyes: Negative for discharge.  Respiratory: Negative for shortness of breath.   Cardiovascular: Negative for chest pain, palpitations and leg swelling.  Gastrointestinal: Negative for nausea, abdominal pain and diarrhea.  Genitourinary: Negative for dysuria.  Musculoskeletal: Positive for joint pain. Negative for falls.  Skin:  Negative for rash.  Neurological: Negative for loss of consciousness and headaches.  Endo/Heme/Allergies: Negative for polydipsia.  Psychiatric/Behavioral: Negative for depression and suicidal ideas. The patient is not nervous/anxious and does not have insomnia.     Objective  BP 96/68 mmHg  Pulse 68  Temp(Src) 97.8 F (36.6 C) (Oral)  Resp 16  Ht 5\' 6"  (1.676 m)  Wt 132 lb (59.875 kg)  BMI 21.32 kg/m2  SpO2 97%  Physical Exam  Physical Exam  Constitutional: She is oriented to person, place, and time and well-developed, well-nourished, and in no distress. No distress.  HENT:  Head: Normocephalic and atraumatic.  Eyes: Conjunctivae are normal.  Neck: Neck supple. No thyromegaly present.  Cardiovascular: Normal rate, regular rhythm and normal heart sounds.   No murmur heard. Pulmonary/Chest: Effort normal and breath sounds normal. She has no wheezes.  Abdominal: She exhibits no distension and no mass.  Musculoskeletal: She exhibits no edema.  Lymphadenopathy:    She has no cervical adenopathy.  Neurological: She is alert and oriented to person, place, and time.  Skin: Skin is warm and dry. No rash noted. She is not diaphoretic.  Psychiatric: Memory, affect and judgment normal.    Lab Results  Component Value Date   TSH 3.02 12/07/2014   Lab Results  Component Value Date   WBC 4.6 03/04/2015   HGB 11.9* 03/04/2015   HCT 35.1* 03/04/2015   MCV 93.2 03/04/2015   PLT 285.0 03/04/2015   Lab Results  Component Value Date   CREATININE 0.74 12/07/2014   BUN 16 12/07/2014    NA 138 12/07/2014   K 3.5 12/07/2014   CL 101 12/07/2014   CO2 32 12/07/2014   Lab Results  Component Value Date   ALT 15 12/07/2014   AST 16 12/07/2014   ALKPHOS 97 12/07/2014   BILITOT 0.3 12/07/2014   Lab Results  Component Value Date   CHOL 192 12/07/2014   Lab Results  Component Value Date   HDL 66.40 12/07/2014   Lab Results  Component Value Date   LDLCALC 87 12/07/2014   Lab Results  Component Value Date   TRIG 193.0* 12/07/2014   Lab Results  Component Value Date   CHOLHDL 3 12/07/2014     Assessment & Plan  Knee pain, bilateral Will continue with Dr Alvan Dame, has responded to cortisone injections. May use Tylenol and salon pas prn   Chronic alcoholism in remission Has not drunk any alcohol in nearly 2 months. Encouraged to continue her efforts.   Mixed hyperlipidemia Tolerating statin, encouraged heart healthy diet, avoid trans fats, minimize simple carbs and saturated fats. Increase exercise as tolerated   Tachycardia Resolved at present, likely related to previous alcohol use

## 2015-03-13 NOTE — Assessment & Plan Note (Signed)
Has not drunk any alcohol in nearly 2 months. Encouraged to continue her efforts.

## 2015-03-28 ENCOUNTER — Encounter: Payer: Commercial Managed Care - HMO | Admitting: Family Medicine

## 2015-03-29 ENCOUNTER — Other Ambulatory Visit: Payer: Self-pay | Admitting: Internal Medicine

## 2015-04-17 ENCOUNTER — Other Ambulatory Visit: Payer: Self-pay | Admitting: Oncology

## 2015-04-17 DIAGNOSIS — C50911 Malignant neoplasm of unspecified site of right female breast: Secondary | ICD-10-CM

## 2015-04-18 ENCOUNTER — Telehealth: Payer: Self-pay | Admitting: Oncology

## 2015-04-18 NOTE — Telephone Encounter (Signed)
Patients daughter called in to reschedule her appointment as [place] is [place]

## 2015-04-21 ENCOUNTER — Ambulatory Visit: Payer: Commercial Managed Care - HMO | Admitting: Oncology

## 2015-04-21 ENCOUNTER — Other Ambulatory Visit: Payer: Commercial Managed Care - HMO

## 2015-05-09 ENCOUNTER — Telehealth: Payer: Self-pay | Admitting: Oncology

## 2015-05-09 ENCOUNTER — Other Ambulatory Visit (HOSPITAL_BASED_OUTPATIENT_CLINIC_OR_DEPARTMENT_OTHER): Payer: Commercial Managed Care - HMO

## 2015-05-09 ENCOUNTER — Ambulatory Visit (HOSPITAL_BASED_OUTPATIENT_CLINIC_OR_DEPARTMENT_OTHER): Payer: Commercial Managed Care - HMO | Admitting: Oncology

## 2015-05-09 ENCOUNTER — Other Ambulatory Visit: Payer: Self-pay

## 2015-05-09 ENCOUNTER — Encounter: Payer: Self-pay | Admitting: Oncology

## 2015-05-09 VITALS — BP 115/71 | HR 97 | Temp 98.1°F | Resp 18 | Ht 66.0 in | Wt 128.8 lb

## 2015-05-09 DIAGNOSIS — M8580 Other specified disorders of bone density and structure, unspecified site: Secondary | ICD-10-CM | POA: Diagnosis not present

## 2015-05-09 DIAGNOSIS — C50911 Malignant neoplasm of unspecified site of right female breast: Secondary | ICD-10-CM

## 2015-05-09 DIAGNOSIS — Z853 Personal history of malignant neoplasm of breast: Secondary | ICD-10-CM | POA: Diagnosis not present

## 2015-05-09 DIAGNOSIS — Z1231 Encounter for screening mammogram for malignant neoplasm of breast: Secondary | ICD-10-CM

## 2015-05-09 LAB — COMPREHENSIVE METABOLIC PANEL (CC13)
ALBUMIN: 3.8 g/dL (ref 3.5–5.0)
ALK PHOS: 131 U/L (ref 40–150)
ALT: 26 U/L (ref 0–55)
AST: 20 U/L (ref 5–34)
Anion Gap: 8 mEq/L (ref 3–11)
BILIRUBIN TOTAL: 0.49 mg/dL (ref 0.20–1.20)
BUN: 16.2 mg/dL (ref 7.0–26.0)
CHLORIDE: 105 meq/L (ref 98–109)
CO2: 30 mEq/L — ABNORMAL HIGH (ref 22–29)
Calcium: 9.6 mg/dL (ref 8.4–10.4)
Creatinine: 0.8 mg/dL (ref 0.6–1.1)
EGFR: 71 mL/min/{1.73_m2} — ABNORMAL LOW (ref >90)
Glucose: 82 mg/dl (ref 70–140)
POTASSIUM: 3.5 meq/L (ref 3.5–5.1)
Sodium: 142 mEq/L (ref 136–145)
TOTAL PROTEIN: 7.1 g/dL (ref 6.4–8.3)

## 2015-05-09 LAB — CBC WITH DIFFERENTIAL/PLATELET
BASO%: 0.8 % (ref 0.0–2.0)
Basophils Absolute: 0 10*3/uL (ref 0.0–0.1)
EOS%: 1.8 % (ref 0.0–7.0)
Eosinophils Absolute: 0.1 10*3/uL (ref 0.0–0.5)
HCT: 37.2 % (ref 34.8–46.6)
HGB: 12.1 g/dL (ref 11.6–15.9)
LYMPH#: 1.5 10*3/uL (ref 0.9–3.3)
LYMPH%: 24.9 % (ref 14.0–49.7)
MCH: 30.1 pg (ref 25.1–34.0)
MCHC: 32.5 g/dL (ref 31.5–36.0)
MCV: 92.7 fL (ref 79.5–101.0)
MONO#: 0.4 10*3/uL (ref 0.1–0.9)
MONO%: 6.5 % (ref 0.0–14.0)
NEUT#: 3.9 10*3/uL (ref 1.5–6.5)
NEUT%: 66 % (ref 38.4–76.8)
Platelets: 277 10*3/uL (ref 145–400)
RBC: 4.01 10*6/uL (ref 3.70–5.45)
RDW: 14 % (ref 11.2–14.5)
WBC: 5.9 10*3/uL (ref 3.9–10.3)

## 2015-05-09 NOTE — Telephone Encounter (Signed)
Gave avs. [facility] schedule not available & [facility] schedule not available.

## 2015-05-09 NOTE — Progress Notes (Signed)
OFFICE PROGRESS NOTE   May 09, 2015   Physicians:S.Cindy Hazy (urology), C.Gessner, G.Chapman Fitch Sanger, Dorris Carnes  INTERVAL HISTORY:  Patient is seen, together with husband, in scheduled follow up of history of right breast cancers. She did not resume Arimidex after visit 10-2014 as we had discussed,  "it makes me feel too bad"; per husband "she has been off of that for 2 years". She had tomo left mammogram at [facility] 02-25-15, with heterogeneously dense breast tissue but no other mammographic findings of concern. Last DEXA in this EMR was at [facility] 02-15-14, with lowest T score -1.9. She follows with PCP Dr Charlett Blake regularly   Patient denies any changes in left breast or reconstructed right breast. She reports appetite ok and no pain. She denies SOB. She does not have other complaints that seem referable to the breast cancer history or previous treatment.  No central catheter  ONCOLOGIC HISTORY History is of 2 primary lobular right breast carcinomas. Her initial diagnosis was March 2001, treated systemically with CMF followed by 5 years of tamoxifen. Second diagnosis was Jan. 2008 (tho patient did not agree to surgery until July 2008) and subsequently had no adjuvant systemic treatment at her decision. She had a complicated course with difficult healing after right breast reconstruction. She had skin recurrence lateral right chest wall excised in Sept 2010 then treated with xeloda from Oct 2010 thru Feb 2011 and began on Arimidex July 2011; she may have stopped Arimidex after bone density scan 02-2014, or possibly the year prior (?). She has had no documented active disease since this most recent treatment, including PET 05-14-12 with no evidence of metastatic disease. Bone density scan [facility] 02-15-14 still osteopenic range, slightly lower in LS compared with 2013 and stable in hips. [facility]25-15    Review of systems as above, also: No recent  infectious illness. Occasional episodes of fatigue resolve with resting, generally able to do regular activities including yard work, has not resumed regular walking for exercise. No cardiac symptoms. States eating regularly. Remainder of 10 point Review of Systems negative.  Objective:  Vital signs in last 24 hours:  BP 115/71 mmHg  Pulse 97  Temp(Src) 98.1 F (36.7 C) (Oral)  Resp 18  Ht $R'5\' 6"'Fy$  (1.676 m)  Wt 128 lb 12.8 oz (58.423 kg)  BMI 20.80 kg/m2  SpO2 98% Weight is up 2 lbs.  Alert, oriented and appropriate. Ambulatory without assistance difficulty.  Alopecia  HEENT:PERRL, sclerae not icteric. Oral mucosa moist without lesions, posterior pharynx clear.  Neck supple. No JVD.  Lymphatics:no cervical,supraclavicular, axillary or inguinal adenopathy Resp: clear to auscultation bilaterally and normal percussion bilaterally Cardio: regular rate and rhythm. No gallop. GI: soft, nontender, not distended, no mass or organomegaly. Normally active bowel sounds.  Musculoskeletal/ Extremities: without pitting edema, cords, tenderness Neuro: speech fluent, otherwise nonfocal. Psych appropriate mood and affect Skin without rash, ecchymosis, petechiae Breasts: Reconstructed right breast no findings of concern for local recurrence including laterally where recurrent disease excised; left breast without dominant mass, skin or nipple findings. Axillae benign.   Lab Results:  Results for orders placed or performed in visit on 05/09/15  CBC with Differential  Result Value Ref Range   WBC 5.9 3.9 - 10.3 10e3/uL   NEUT# 3.9 1.5 - 6.5 10e3/uL   HGB 12.1 11.6 - 15.9 g/dL   HCT 37.2 34.8 - 46.6 %   Platelets 277 145 - 400 10e3/uL   MCV 92.7 79.5 -  101.0 fL   MCH 30.1 25.1 - 34.0 pg   MCHC 32.5 31.5 - 36.0 g/dL   RBC 4.01 3.70 - 5.45 10e6/uL   RDW 14.0 11.2 - 14.5 %   lymph# 1.5 0.9 - 3.3 10e3/uL   MONO# 0.4 0.1 - 0.9 10e3/uL   Eosinophils Absolute 0.1 0.0 - 0.5 10e3/uL   Basophils  Absolute 0.0 0.0 - 0.1 10e3/uL   NEUT% 66.0 38.4 - 76.8 %   LYMPH% 24.9 14.0 - 49.7 %   MONO% 6.5 0.0 - 14.0 %   EOS% 1.8 0.0 - 7.0 %   BASO% 0.8 0.0 - 2.0 %  Comprehensive metabolic panel (Cmet) - [facility]  Result Value Ref Range   Sodium 142 136 - 145 mEq/L   Potassium 3.5 3.5 - 5.1 mEq/L   Chloride 105 98 - 109 mEq/L   CO2 30 (H) 22 - 29 mEq/L   Glucose 82 70 - 140 mg/dl   BUN 16.2 7.0 - 26.0 mg/dL   Creatinine 0.8 0.6 - 1.1 mg/dL   Total Bilirubin 0.49 0.20 - 1.20 mg/dL   Alkaline Phosphatase 131 40 - 150 U/L   AST 20 5 - 34 U/L   ALT 26 0 - 55 U/L   Total Protein 7.1 6.4 - 8.3 g/dL   Albumin 3.8 3.5 - 5.0 g/dL   Calcium 9.6 8.4 - 10.4 mg/dL   Anion Gap 8 3 - 11 mEq/L   EGFR 71 (L) >90 ml/min/1.73 m2     Studies/Results:  Left tomo mammogram done at [facility]4-15-16 with breast tissue still heterogeneously dense but no other mammographic findings of concern.   Medications: I have reviewed the patient's current medications. She is on supplemental Vit D, relies on dietary calcium.  DISCUSSION: she does not want to resume hormonal therapy for the breast cancer, but does want to be followed at this office on yearly basis, which I am glad to do.  She is aware that best imaging for dense breast tissue is 3C tomo mammography.   Assessment/Plan: 1.two primary lobular right breast carcinomas, in 2001 and 2008, with lateral right chest wall recurrence 07-2009: She did not resume arimidex when I saw her 10-2014 and prefers not to do this. Left tomo mammogram ok 02-2015 and should repeat 1 year. . Return visit with labs 1 year. 2.osteopenia: needs at least 1200 mg calcium daily. Resume regular weight bearing exercise 3.long past tobacco 4.heavy ETOH in past, likely continues 5.hx anxiety and depression 6.needs flu vaccine this fall 7.chronic urologic problems known to urology 8.previous esophageal dilitations  Patient and husband are in agreement with recommendations and plans as above.   Time spent 15 min including >50% counseling and coordination of care.      Anne [name] P, MD   05/09/2015, 3:24 PM

## 2015-05-17 ENCOUNTER — Encounter: Payer: Self-pay | Admitting: Oncology

## 2015-05-18 NOTE — Telephone Encounter (Signed)
Called [name] and told her that the EGFR is a calculation of Kidney function.  ~71 is not a bad level per Dr. Marko Plume.  Drinking 64 oz of water or juices is  good  For staying  well hydrated and kidneys functioning optimally.  Anne [name] verbalized understanding.

## 2015-05-20 ENCOUNTER — Telehealth: Payer: Self-pay | Admitting: Family Medicine

## 2015-05-20 NOTE — Telephone Encounter (Signed)
Pre visit letter mailed 05/20/15

## 2015-06-06 ENCOUNTER — Encounter: Payer: Self-pay | Admitting: Oncology

## 2015-06-07 ENCOUNTER — Other Ambulatory Visit: Payer: Self-pay | Admitting: Oncology

## 2015-06-08 ENCOUNTER — Telehealth: Payer: Self-pay

## 2015-06-08 ENCOUNTER — Encounter: Payer: Self-pay | Admitting: Physician Assistant

## 2015-06-08 ENCOUNTER — Ambulatory Visit (INDEPENDENT_AMBULATORY_CARE_PROVIDER_SITE_OTHER): Payer: Commercial Managed Care - HMO | Admitting: Physician Assistant

## 2015-06-08 VITALS — BP 98/62 | HR 106 | Temp 99.0°F | Ht 66.0 in | Wt 128.2 lb

## 2015-06-08 DIAGNOSIS — J208 Acute bronchitis due to other specified organisms: Principal | ICD-10-CM

## 2015-06-08 DIAGNOSIS — B9689 Other specified bacterial agents as the cause of diseases classified elsewhere: Secondary | ICD-10-CM

## 2015-06-08 DIAGNOSIS — J00 Acute nasopharyngitis [common cold]: Secondary | ICD-10-CM | POA: Diagnosis not present

## 2015-06-08 MED ORDER — AZITHROMYCIN 250 MG PO TABS: ORAL_TABLET | ORAL | Status: DC

## 2015-06-08 MED ORDER — BENZONATATE 200 MG PO CAPS: 200.0000 mg | ORAL_CAPSULE | Freq: Two times a day (BID) | ORAL | Status: DC | PRN

## 2015-06-08 MED ORDER — ALBUTEROL SULFATE HFA 108 (90 BASE) MCG/ACT IN AERS: 2.0000 | INHALATION_SPRAY | Freq: Four times a day (QID) | RESPIRATORY_TRACT | Status: DC | PRN

## 2015-06-08 NOTE — Patient Instructions (Signed)
Please take antibiotic as directed. Increase fluids and get plenty of rest. Use the Tessalon as directed for cough.  You can use Robitussin or plain Mucinex as well.   Use the albuterol inhaler up to every 4-6 hours if needed for chest tightness or shortness of breath.  Follow-up if symptoms are not resolving.  Metered Dose Inhaler (No Spacer Used) Inhaled medicines are the basis of treatment for asthma and other breathing problems. Inhaled medicine can only be effective if used properly. Good technique assures that the medicine reaches the lungs. Metered dose inhalers (MDIs) are used to deliver a variety of inhaled medicines. These include quick relief or rescue medicines (such as bronchodilators) and controller medicines (such as corticosteroids). The medicine is delivered by pushing down on a metal canister to release a set amount of spray. If you are using different kinds of inhalers, use your quick relief medicine to open the airways 10-15 minutes before using a steroid, if instructed to do so by your health care provider. If you are unsure which inhalers to use and the order of using them, ask your health care provider, nurse, or respiratory therapist. HOW TO USE THE INHALER 1. Remove the cap from the inhaler. 2. If you are using the inhaler for the first time, you will need to prime it. Shake the inhaler for 5 seconds and release four puffs into the air, away from your face. Ask your health care provider or pharmacist if you have questions about priming your inhaler. 3. Shake the inhaler for 5 seconds before each breath in (inhalation). 4. Position the inhaler so that the top of the canister faces up. 5. Put your index finger on the top of the medicine canister. Your thumb supports the bottom of the inhaler. 6. Open your mouth. 7. Either place the inhaler between your teeth and place your lips tightly around the mouthpiece, or hold the inhaler 1-2 inches away from your open mouth. If you are  unsure of which technique to use, ask your health care provider. 8. Breathe out (exhale) normally and as completely as possible. 9. Press the canister down with the index finger to release the medicine. 10. At the same time as the canister is pressed, inhale deeply and slowly until your lungs are completely filled. This should take 4-6 seconds. Keep your tongue down. 11. Hold the medicine in your lungs for 5-10 seconds (10 seconds is best). This helps the medicine get into the small airways of your lungs. 12. Breathe out slowly, through pursed lips. Whistling is an example of pursed lips. 13. Wait at least 1 minute between puffs. Continue with the above steps until you have taken the number of puffs your health care provider has ordered. Do not use the inhaler more than your health care provider directs you to. 14. Replace the cap on the inhaler. 15. Follow the directions from your health care provider or the inhaler insert for cleaning the inhaler. If you are using a steroid inhaler, after your last puff, rinse your mouth with water, gargle, and spit out the water. Do not swallow the water. AVOID:  Inhaling before or after starting the spray of medicine. It takes practice to coordinate your breathing with triggering the spray.  Inhaling through the nose (rather than the mouth) when triggering the spray. HOW TO DETERMINE IF YOUR INHALER IS FULL OR NEARLY EMPTY You cannot know when an inhaler is empty by shaking it. Some inhalers are now being made with dose counters. Ask  your health care provider for a prescription that has a dose counter if you feel you need that extra help. If your inhaler does not have a counter, ask your health care provider to help you determine the date you need to refill your inhaler. Write the refill date on a calendar or your inhaler canister. Refill your inhaler 7-10 days before it runs out. Be sure to keep an adequate supply of medicine. This includes making sure it has  not expired, and making sure you have a spare inhaler. SEEK MEDICAL CARE IF:  Symptoms are only partially relieved with your inhaler.  You are having trouble using your inhaler.  You experience an increase in phlegm. SEEK IMMEDIATE MEDICAL CARE IF:  You feel little or no relief with your inhalers. You are still wheezing and feeling shortness of breath, tightness in your chest, or both.  You have dizziness, headaches, or a fast heart rate.  You have chills, fever, or night sweats.  There is a noticeable increase in phlegm production, or there is blood in the phlegm. MAKE SURE YOU:  Understand these instructions.  Will watch your condition.  Will get help right away if you are not doing well or get worse. Document Released: 08/26/2007 Document Revised: 03/15/2014 Document Reviewed: 04/16/2013 [facility] Patient Information 2015 [place], [place]. This information is not intended to replace advice given to you by your health care provider. Make sure you discuss any questions you have with your health care provider.

## 2015-06-08 NOTE — Progress Notes (Signed)
Pre visit review using our clinic review tool, if applicable. No additional management support is needed unless otherwise documented below in the visit note. 

## 2015-06-08 NOTE — Telephone Encounter (Signed)
Attempted pre visit call. Pt not available.

## 2015-06-08 NOTE — Assessment & Plan Note (Signed)
Rx Azithromycin. Rx Albuterol. Rx Tessalon. Take all as directed. Increase hydration, rest, plain Mucinex. Other supportive measures reviewed. Follow-up PRN if symptoms not resolving.

## 2015-06-08 NOTE — Progress Notes (Signed)
  Patient presents to clinic today c/o worsening cough and chest congestion over past 5-6 days, now with onset of low-grade fever and chest tightness with wheezing. Patient with history of mild emphysema. Is not currently treated for this. Patient denies sick contact or recent travel. Denies chest pain but endorses SOB with coughing.  Past Medical History  Diagnosis Date  . Anemia   . COPD (chronic obstructive pulmonary disease)   . Hyperlipidemia   . Pneumonia 2-10    pleurisy  . Arthritis   . Depression   . GERD (gastroesophageal reflux disease) 09/29/2009    improved s/p cholecystectomy and esophagus dilatation  . History of chicken pox   . History of shingles     2 episodes  . History of measles   . Osteopenia 03/14/2011  . 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 Pure hypercholesterolemia 10/17/2010  . 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 ESOPHAGEAL STRICTURE 03/29/2009  . CAROTID ARTERY STENOSIS 01/13/2010  . ALKALINE PHOSPHATASE, ELEVATED 03/15/2009  . Anxiety and depression 04/28/2011  . Vaginitis 05/22/2011  . Baker's cyst of knee 05/22/2011  . Trauma 10/18/2011  . Radial neck fracture 10/2011    minimally displaced  . Atypical chest pain 11/30/2011  . Folliculitis of nose 12/31/2011  . Esophageal ring   . 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 Urinary incontinence 03/19/2012  . Allergic state 06/10/2012  . Hematuria   . Dysphagia   . URI (upper respiratory infection) 09/02/2012  . Preventative health care 10/21/2012  . Dermatitis 11/23/2012  . Fall 11/23/2012  . Hx of echocardiogram     a. Echo 01/2103: mild LVH, EF 55-60%, normal wall motion, Gr 1 diast dysfn  . Knee pain, bilateral 07/23/2011  . Elevated sed rate 08/02/2013  . EE (eosinophilic esophagitis)   . Urinary frequency 12/12/2014  . 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    . Mixed hyperlipidemia  10/17/2010    Qualifier: Diagnosis of  By: Ferguson, Sharon      Current Outpatient Prescriptions on File Prior to Visit  Medication Sig Dispense Refill  . amitriptyline (ELAVIL) 150 MG tablet Take 1 tablet (150 mg total) by mouth at bedtime. 30 tablet 2  . aspirin 81 MG tablet Take 81 mg by mouth daily.      . atorvastatin (LIPITOR) 40 MG tablet TAKE 1 TABLET EVERY DAY 90 tablet 3  . Cholecalciferol (VITAMIN D3) 1000 UNITS CAPS Take 2 tablets by mouth daily.      . hyoscyamine (LEVSIN SL) 0.125 MG SL tablet Place 1 tablet (0.125 mg total) under the tongue every 4 (four) hours as needed. 30 tablet 2  . loratadine (CLARITIN) 10 MG tablet Take 10 mg by mouth daily.    . LORazepam (ATIVAN) 1 MG tablet take 1 tablet by mouth every 8 hours if needed for anxiety 60 tablet 2  . pantoprazole (PROTONIX) 40 MG tablet Take 1 tablet (40 mg total) by mouth 2 (two) times daily. 180 tablet 3  . Probiotic Product (PROBIOTIC FORMULA PO) Take 1 capsule by mouth daily.    . vitamin B-12 (CYANOCOBALAMIN) 1000 MCG tablet Take 1,000 mcg by mouth daily.       No current facility-administered medications on file prior to visit.    Allergies  Allergen Reactions  . Codeine Other (See Comments)    "flu like   symptoms"  . Diphenhydramine Hcl Other (See Comments)    "nervous and upset"  . Grapeseed Extract [Nutritional Supplements]     GRAPES  . Morphine Other (See Comments)    "flu like symptoms"  . Peanut-Containing Drug Products Hives  . Tomato Diarrhea  . Zofran Other (See Comments)    HEADACHE    Family History  Problem Relation Age of Onset  . Heart disease Father   . Lung cancer Father     smoker  . Cirrhosis Sister     Primary Biliary  . Breast cancer Maternal Aunt     x 3  . Stroke Maternal Grandmother   . Alcohol abuse Maternal Grandfather   . Heart disease Paternal Grandfather   . Anxiety disorder Sister   . Osteoporosis Sister   . Arthritis Sister     Rheumatoid  . Osteoporosis  Sister   . Cancer Sister     multiple skin cancers, over 90 excisions.  . Other Mother     tic deleauroux  . Anesthesia problems Neg Hx   . Hypotension Neg Hx   . Malignant hyperthermia Neg Hx   . Pseudochol deficiency Neg Hx   . Colon cancer Neg Hx   . Ovarian cancer Neg Hx   . Pancreatic cancer Neg Hx   . Breast cancer Paternal Aunt     History   Social History  . Marital Status: Married    Spouse Name: N/A  . Number of Children: N/A  . Years of Education: N/A   Social History Main Topics  . Smoking status: Former Smoker    Types: Cigarettes    Quit date: 05/22/2007  . Smokeless tobacco: Never Used  . Alcohol Use: 0.6 oz/week    1 Glasses of wine per week     Comment: at least 6 oz daily  . Drug Use: No  . Sexual Activity:    Partners: Male   Other Topics Concern  . None   Social History Narrative    Review of Systems - See HPI.  All other ROS are negative.  BP 98/62 mmHg  Pulse 106  Temp(Src) 99 F (37.2 C) (Oral)  Ht 5' 6" (1.676 m)  Wt 128 lb 3.2 oz (58.151 kg)  BMI 20.70 kg/m2  SpO2 96%  Physical Exam  Constitutional: She is well-developed, well-nourished, and in no distress.  HENT:  Head: Normocephalic and atraumatic.  Right Ear: External ear normal.  Left Ear: External ear normal.  Nose: Nose normal.  Mouth/Throat: Oropharynx is clear and moist. No oropharyngeal exudate.  Eyes: Conjunctivae are normal.  Neck: Neck supple.  Cardiovascular: Normal rate, regular rhythm, normal heart sounds and intact distal pulses.   Pulmonary/Chest: Effort normal. No respiratory distress. She has wheezes. She has no rales. She exhibits no tenderness.  Neurological: She is alert.  Skin: Skin is warm and dry. No rash noted.  Vitals reviewed.   Recent Results (from the past 2160 hour(s))  CBC with Differential     Status: None   Collection Time: 05/09/15  2:42 PM  Result Value Ref Range   WBC 5.9 3.9 - 10.3 10e3/uL   NEUT# 3.9 1.5 - 6.5 10e3/uL   HGB 12.1  11.6 - 15.9 g/dL   HCT 37.2 34.8 - 46.6 %   Platelets 277 145 - 400 10e3/uL   MCV 92.7 79.5 - 101.0 fL   MCH 30.1 25.1 - 34.0 pg   MCHC 32.5 31.5 - 36.0 g/dL   RBC 4.01 3.70 -   5.45 10e6/uL   RDW 14.0 11.2 - 14.5 %   lymph# 1.5 0.9 - 3.3 10e3/uL   MONO# 0.4 0.1 - 0.9 10e3/uL   Eosinophils Absolute 0.1 0.0 - 0.5 10e3/uL   Basophils Absolute 0.0 0.0 - 0.1 10e3/uL   NEUT% 66.0 38.4 - 76.8 %   LYMPH% 24.9 14.0 - 49.7 %   MONO% 6.5 0.0 - 14.0 %   EOS% 1.8 0.0 - 7.0 %   BASO% 0.8 0.0 - 2.0 %  Comprehensive metabolic panel (Cmet) - [facility]     Status: Abnormal   Collection Time: 05/09/15  2:42 PM  Result Value Ref Range   Sodium 142 136 - 145 mEq/L   Potassium 3.5 3.5 - 5.1 mEq/L   Chloride 105 98 - 109 mEq/L   CO2 30 (H) 22 - 29 mEq/L   Glucose 82 70 - 140 mg/dl   BUN 16.2 7.0 - 26.0 mg/dL   Creatinine 0.8 0.6 - 1.1 mg/dL   Total Bilirubin 0.49 0.20 - 1.20 mg/dL   Alkaline Phosphatase 131 40 - 150 U/L   AST 20 5 - 34 U/L   ALT 26 0 - 55 U/L   Total Protein 7.1 6.4 - 8.3 g/dL   Albumin 3.8 3.5 - 5.0 g/dL   Calcium 9.6 8.4 - 10.4 mg/dL   Anion Gap 8 3 - 11 mEq/L   EGFR 71 (L) >90 ml/min/1.73 m2    Comment: eGFR is calculated using the CKD-EPI Creatinine Equation (2009)    Assessment/Plan: Acute bacterial bronchitis Rx Azithromycin. Rx Albuterol. Rx Tessalon. Take all as directed. Increase hydration, rest, plain Mucinex. Other supportive measures reviewed. Follow-up PRN if symptoms not resolving.

## 2015-06-10 ENCOUNTER — Encounter: Payer: Self-pay | Admitting: Family Medicine

## 2015-06-10 ENCOUNTER — Ambulatory Visit (INDEPENDENT_AMBULATORY_CARE_PROVIDER_SITE_OTHER): Payer: Commercial Managed Care - HMO | Admitting: Family Medicine

## 2015-06-10 VITALS — BP 98/64 | HR 92 | Temp 98.2°F | Resp 16 | Wt 127.8 lb

## 2015-06-10 DIAGNOSIS — R000 Tachycardia, unspecified: Secondary | ICD-10-CM | POA: Diagnosis not present

## 2015-06-10 DIAGNOSIS — Z0000 Encounter for general adult medical examination without abnormal findings: Secondary | ICD-10-CM | POA: Diagnosis not present

## 2015-06-10 DIAGNOSIS — D509 Iron deficiency anemia, unspecified: Secondary | ICD-10-CM

## 2015-06-10 DIAGNOSIS — R0602 Shortness of breath: Secondary | ICD-10-CM

## 2015-06-10 DIAGNOSIS — M8580 Other specified disorders of bone density and structure, unspecified site: Secondary | ICD-10-CM | POA: Diagnosis not present

## 2015-06-10 DIAGNOSIS — E782 Mixed hyperlipidemia: Secondary | ICD-10-CM

## 2015-06-10 DIAGNOSIS — J208 Acute bronchitis due to other specified organisms: Secondary | ICD-10-CM

## 2015-06-10 DIAGNOSIS — B9689 Other specified bacterial agents as the cause of diseases classified elsewhere: Secondary | ICD-10-CM

## 2015-06-10 LAB — LIPID PANEL
Cholesterol: 153 mg/dL (ref 0–200)
HDL: 61.6 mg/dL (ref >39.00)
LDL Cholesterol: 72 mg/dL (ref 0–99)
NonHDL: 91.51
Total CHOL/HDL Ratio: 2
Triglycerides: 100 mg/dL (ref 0.0–149.0)
VLDL: 20 mg/dL (ref 0.0–40.0)

## 2015-06-10 LAB — COMPREHENSIVE METABOLIC PANEL
ALK PHOS: 130 U/L — AB (ref 39–117)
ALT: 47 U/L — ABNORMAL HIGH (ref 0–35)
AST: 34 U/L (ref 0–37)
Albumin: 4.1 g/dL (ref 3.5–5.2)
BUN: 15 mg/dL (ref 6–23)
CO2: 31 mEq/L (ref 19–32)
Calcium: 9.5 mg/dL (ref 8.4–10.5)
Chloride: 101 mEq/L (ref 96–112)
Creatinine, Ser: 0.73 mg/dL (ref 0.40–1.20)
GFR: 83.12 mL/min (ref >60.00)
Glucose, Bld: 76 mg/dL (ref 70–99)
POTASSIUM: 3.8 meq/L (ref 3.5–5.1)
Sodium: 140 mEq/L (ref 135–145)
TOTAL PROTEIN: 7.5 g/dL (ref 6.0–8.3)
Total Bilirubin: 0.3 mg/dL (ref 0.2–1.2)

## 2015-06-10 LAB — CBC
HEMATOCRIT: 38.4 % (ref 36.0–46.0)
HEMOGLOBIN: 12.7 g/dL (ref 12.0–15.0)
MCHC: 33 g/dL (ref 30.0–36.0)
MCV: 91.5 fl (ref 78.0–100.0)
Platelets: 274 10*3/uL (ref 150.0–400.0)
RBC: 4.19 Mil/uL (ref 3.87–5.11)
RDW: 15 % (ref 11.5–15.5)
WBC: 5.5 10*3/uL (ref 4.0–10.5)

## 2015-06-10 LAB — TSH: TSH: 3.44 u[IU]/mL (ref 0.35–4.50)

## 2015-06-10 NOTE — Progress Notes (Signed)
Pre visit review using our clinic review tool, if applicable. No additional management support is needed unless otherwise documented below in the visit note. 

## 2015-06-10 NOTE — Patient Instructions (Signed)
Prevnar (13), pneumonia shot to take first followed 1 year later with Pneumovax (23)  Pneumonia shot  Recommend Zostavax (Shingles) shot 30 days after or 30 days the pneumonia shot   Guaifensin/Mucinex twice daily Probiotics Elderberry liquid for the mucus and throat Garlic, aged or black   [place], can order at [email or web address]   Preventive Care for Adults A healthy lifestyle and preventive care can promote health and wellness. Preventive health guidelines for women include the following key practices.  A routine yearly physical is a good way to check with your health care provider about your health and preventive screening. It is a chance to share any concerns and updates on your health and to receive a thorough exam.  Visit your dentist for a routine exam and preventive care every 6 months. Brush your teeth twice a day and floss once a day. Good oral hygiene prevents tooth decay and gum disease.  The frequency of eye exams is based on your age, health, family medical history, use of contact lenses, and other factors. Follow your health care provider's recommendations for frequency of eye exams.  Eat a healthy diet. Foods like vegetables, fruits, whole grains, low-fat dairy products, and lean protein foods contain the nutrients you need without too many calories. Decrease your intake of foods high in solid fats, added sugars, and salt. Eat the right amount of calories for you.Get information about a proper diet from your health care provider, if necessary.  Regular physical exercise is one of the most important things you can do for your health. Most adults should get at least 150 minutes of moderate-intensity exercise (any activity that increases your heart rate and causes you to sweat) each week. In addition, most adults need muscle-strengthening exercises on 2 or more days a week.  Maintain a healthy weight. The body mass index (BMI) is a screening tool to identify possible  weight problems. It provides an estimate of body fat based on height and weight. Your health care provider can find your BMI and can help you achieve or maintain a healthy weight.For adults 20 years and older:  A BMI below 18.5 is considered underweight.  A BMI of 18.5 to 24.9 is normal.  A BMI of 25 to 29.9 is considered overweight.  A BMI of 30 and above is considered obese.  Maintain normal blood lipids and cholesterol levels by exercising and minimizing your intake of saturated fat. Eat a balanced diet with plenty of fruit and vegetables. Blood tests for lipids and cholesterol should begin at age 65 and be repeated every 5 years. If your lipid or cholesterol levels are high, you are over 50, or you are at high risk for heart disease, you may need your cholesterol levels checked more frequently.Ongoing high lipid and cholesterol levels should be treated with medicines if diet and exercise are not working.  If you smoke, find out from your health care provider how to quit. If you do not use tobacco, do not start.  Lung cancer screening is recommended for adults aged 54-80 years who are at high risk for developing lung cancer because of a history of smoking. A yearly low-dose CT scan of the lungs is recommended for people who have at least a 30-pack-year history of smoking and are a current smoker or have quit within the past 15 years. A pack year of smoking is smoking an average of 1 pack of cigarettes a day for 1 year (for example: 1 pack a day for 30  years or 2 packs a day for 15 years). Yearly screening should continue until the smoker has stopped smoking for at least 15 years. Yearly screening should be stopped for people who develop a health problem that would prevent them from having lung cancer treatment.  If you are pregnant, do not drink alcohol. If you are breastfeeding, be very cautious about drinking alcohol. If you are not pregnant and choose to drink alcohol, do not have more than 1  drink per day. One drink is considered to be 12 ounces (355 mL) of beer, 5 ounces (148 mL) of wine, or 1.5 ounces (44 mL) of liquor.  Avoid use of street drugs. Do not share needles with anyone. Ask for help if you need support or instructions about stopping the use of drugs.  High blood pressure causes heart disease and increases the risk of stroke. Your blood pressure should be checked at least every 1 to 2 years. Ongoing high blood pressure should be treated with medicines if weight loss and exercise do not work.  If you are 35-60 years old, ask your health care provider if you should take aspirin to prevent strokes.  Diabetes screening involves taking a blood sample to check your fasting blood sugar level. This should be done once every 3 years, after age 82, if you are within normal weight and without risk factors for diabetes. Testing should be considered at a younger age or be carried out more frequently if you are overweight and have at least 1 risk factor for diabetes.  Breast cancer screening is essential preventive care for women. You should practice "breast self-awareness." This means understanding the normal appearance and feel of your breasts and may include breast self-examination. Any changes detected, no matter how small, should be reported to a health care provider. Women in their 19s and 30s should have a clinical breast exam (CBE) by a health care provider as part of a regular health exam every 1 to 3 years. After age 51, women should have a CBE every year. Starting at age 57, women should consider having a mammogram (breast X-ray test) every year. Women who have a family history of breast cancer should talk to their health care provider about genetic screening. Women at a high risk of breast cancer should talk to their health care providers about having an MRI and a mammogram every year.  Breast cancer gene (BRCA)-related cancer risk assessment is recommended for women who have  family members with BRCA-related cancers. BRCA-related cancers include breast, ovarian, tubal, and peritoneal cancers. Having family members with these cancers may be associated with an increased risk for harmful changes (mutations) in the breast cancer genes BRCA1 and BRCA2. Results of the assessment will determine the need for genetic counseling and BRCA1 and BRCA2 testing.  Routine pelvic exams to screen for cancer are no longer recommended for nonpregnant women who are considered low risk for cancer of the pelvic organs (ovaries, uterus, and vagina) and who do not have symptoms. Ask your health care provider if a screening pelvic exam is right for you.  If you have had past treatment for cervical cancer or a condition that could lead to cancer, you need Pap tests and screening for cancer for at least 20 years after your treatment. If Pap tests have been discontinued, your risk factors (such as having a new sexual partner) need to be reassessed to determine if screening should be resumed. Some women have medical problems that increase the chance of getting  cervical cancer. In these cases, your health care provider may recommend more frequent screening and Pap tests.  The HPV test is an additional test that may be used for cervical cancer screening. The HPV test looks for the virus that can cause the cell changes on the cervix. The cells collected during the Pap test can be tested for HPV. The HPV test could be used to screen women aged 86 years and older, and should be used in women of any age who have unclear Pap test results. After the age of 52, women should have HPV testing at the same frequency as a Pap test.  Colorectal cancer can be detected and often prevented. Most routine colorectal cancer screening begins at the age of 67 years and continues through age 93 years. However, your health care provider may recommend screening at an earlier age if you have risk factors for colon cancer. On a yearly  basis, your health care provider may provide home test kits to check for hidden blood in the stool. Use of a small camera at the end of a tube, to directly examine the colon (sigmoidoscopy or colonoscopy), can detect the earliest forms of colorectal cancer. Talk to your health care provider about this at age 35, when routine screening begins. Direct exam of the colon should be repeated every 5-10 years through age 65 years, unless early forms of pre-cancerous polyps or small growths are found.  People who are at an increased risk for hepatitis B should be screened for this virus. You are considered at high risk for hepatitis B if:  You were born in a country where hepatitis B occurs often. Talk with your health care provider about which countries are considered high risk.  Your parents were born in a high-risk country and you have not received a shot to protect against hepatitis B (hepatitis B vaccine).  You have HIV or AIDS.  You use needles to inject street drugs.  You live with, or have sex with, someone who has hepatitis B.  You get hemodialysis treatment.  You take certain medicines for conditions like cancer, organ transplantation, and autoimmune conditions.  Hepatitis C blood testing is recommended for all people born from [birth year] through 1965 and any individual with known risks for hepatitis C.  Practice safe sex. Use condoms and avoid high-risk sexual practices to reduce the spread of sexually transmitted infections (STIs). STIs include gonorrhea, chlamydia, syphilis, trichomonas, herpes, HPV, and human immunodeficiency virus (HIV). Herpes, HIV, and HPV are viral illnesses that have no cure. They can result in disability, cancer, and death.  You should be screened for sexually transmitted illnesses (STIs) including gonorrhea and chlamydia if:  You are sexually active and are younger than 24 years.  You are older than 24 years and your health care provider tells you that you are at  risk for this type of infection.  Your sexual activity has changed since you were last screened and you are at an increased risk for chlamydia or gonorrhea. Ask your health care provider if you are at risk.  If you are at risk of being infected with HIV, it is recommended that you take a prescription medicine daily to prevent HIV infection. This is called preexposure prophylaxis (PrEP). You are considered at risk if:  You are a heterosexual woman, are sexually active, and are at increased risk for HIV infection.  You take drugs by injection.  You are sexually active with a partner who has HIV.  Talk with  your health care provider about whether you are at high risk of being infected with HIV. If you choose to begin PrEP, you should first be tested for HIV. You should then be tested every 3 months for as long as you are taking PrEP.  Osteoporosis is a disease in which the bones lose minerals and strength with aging. This can result in serious bone fractures or breaks. The risk of osteoporosis can be identified using a bone density scan. Women ages 16 years and over and women at risk for fractures or osteoporosis should discuss screening with their health care providers. Ask your health care provider whether you should take a calcium supplement or vitamin D to reduce the rate of osteoporosis.  Menopause can be associated with physical symptoms and risks. Hormone replacement therapy is available to decrease symptoms and risks. You should talk to your health care provider about whether hormone replacement therapy is right for you.  Use sunscreen. Apply sunscreen liberally and repeatedly throughout the day. You should seek shade when your shadow is shorter than you. Protect yourself by wearing long sleeves, pants, a wide-brimmed hat, and sunglasses year round, whenever you are outdoors.  Once a month, do a whole body skin exam, using a mirror to look at the skin on your back. Tell your health care  provider of new moles, moles that have irregular borders, moles that are larger than a pencil eraser, or moles that have changed in shape or color.  Stay current with required vaccines (immunizations).  Influenza vaccine. All adults should be immunized every year.  Tetanus, diphtheria, and acellular pertussis (Td, Tdap) vaccine. Pregnant women should receive 1 dose of Tdap vaccine during each pregnancy. The dose should be obtained regardless of the length of time since the last dose. Immunization is preferred during the 27th-36th week of gestation. An adult who has not previously received Tdap or who does not know her vaccine status should receive 1 dose of Tdap. This initial dose should be followed by tetanus and diphtheria toxoids (Td) booster doses every 10 years. Adults with an unknown or incomplete history of completing a 3-dose immunization series with Td-containing vaccines should begin or complete a primary immunization series including a Tdap dose. Adults should receive a Td booster every 10 years.  Varicella vaccine. An adult without evidence of immunity to varicella should receive 2 doses or a second dose if she has previously received 1 dose. Pregnant females who do not have evidence of immunity should receive the first dose after pregnancy. This first dose should be obtained before leaving the health care facility. The second dose should be obtained 4-8 weeks after the first dose.  Human papillomavirus (HPV) vaccine. Females aged 13-26 years who have not received the vaccine previously should obtain the 3-dose series. The vaccine is not recommended for use in pregnant females. However, pregnancy testing is not needed before receiving a dose. If a female is found to be pregnant after receiving a dose, no treatment is needed. In that case, the remaining doses should be delayed until after the pregnancy. Immunization is recommended for any person with an immunocompromised condition through the  age of 45 years if she did not get any or all doses earlier. During the 3-dose series, the second dose should be obtained 4-8 weeks after the first dose. The third dose should be obtained 24 weeks after the first dose and 16 weeks after the second dose.  Zoster vaccine. One dose is recommended for adults aged 41  years or older unless certain conditions are present.  Measles, mumps, and rubella (MMR) vaccine. Adults born before [birth year] generally are considered immune to measles and mumps. Adults born in [birth year] or later should have 1 or more doses of MMR vaccine unless there is a contraindication to the vaccine or there is laboratory evidence of immunity to each of the three diseases. A routine second dose of MMR vaccine should be obtained at least 28 days after the first dose for students attending postsecondary schools, health care workers, or international travelers. People who received inactivated measles vaccine or an unknown type of measles vaccine during 1963-1967 should receive 2 doses of MMR vaccine. People who received inactivated mumps vaccine or an unknown type of mumps vaccine before 1979 and are at high risk for mumps infection should consider immunization with 2 doses of MMR vaccine. For females of childbearing age, rubella immunity should be determined. If there is no evidence of immunity, females who are not pregnant should be vaccinated. If there is no evidence of immunity, females who are pregnant should delay immunization until after pregnancy. Unvaccinated health care workers born before [birth year] who lack laboratory evidence of measles, mumps, or rubella immunity or laboratory confirmation of disease should consider measles and mumps immunization with 2 doses of MMR vaccine or rubella immunization with 1 dose of MMR vaccine.  Pneumococcal 13-valent conjugate (PCV13) vaccine. When indicated, a person who is uncertain of her immunization history and has no record of immunization should receive the  PCV13 vaccine. An adult aged 66 years or older who has certain medical conditions and has not been previously immunized should receive 1 dose of PCV13 vaccine. This PCV13 should be followed with a dose of pneumococcal polysaccharide (PPSV23) vaccine. The PPSV23 vaccine dose should be obtained at least 8 weeks after the dose of PCV13 vaccine. An adult aged 24 years or older who has certain medical conditions and previously received 1 or more doses of PPSV23 vaccine should receive 1 dose of PCV13. The PCV13 vaccine dose should be obtained 1 or more years after the last PPSV23 vaccine dose.  Pneumococcal polysaccharide (PPSV23) vaccine. When PCV13 is also indicated, PCV13 should be obtained first. All adults aged 21 years and older should be immunized. An adult younger than age 40 years who has certain medical conditions should be immunized. Any person who resides in a nursing home or long-term care facility should be immunized. An adult smoker should be immunized. People with an immunocompromised condition and certain other conditions should receive both PCV13 and PPSV23 vaccines. People with human immunodeficiency virus (HIV) infection should be immunized as soon as possible after diagnosis. Immunization during chemotherapy or radiation therapy should be avoided. Routine use of PPSV23 vaccine is not recommended for American Indians, [place]atives, or people younger than 65 years unless there are medical conditions that require PPSV23 vaccine. When indicated, people who have unknown immunization and have no record of immunization should receive PPSV23 vaccine. One-time revaccination 5 years after the first dose of PPSV23 is recommended for people aged 19-64 years who have chronic kidney failure, nephrotic syndrome, asplenia, or immunocompromised conditions. People who received 1-2 doses of PPSV23 before age 51 years should receive another dose of PPSV23 vaccine at age 26 years or later if at least 5 years have  passed since the previous dose. Doses of PPSV23 are not needed for people immunized with PPSV23 at or after age 32 years.  Meningococcal vaccine. Adults with asplenia or persistent complement component deficiencies should  receive 2 doses of quadrivalent meningococcal conjugate (MenACWY-D) vaccine. The doses should be obtained at least 2 months apart. Microbiologists working with certain meningococcal bacteria, [place] recruits, people at risk during an outbreak, and people who travel to or live in countries with a high rate of meningitis should be immunized. A first-year college student up through age 47 years who is living in a residence hall should receive a dose if she did not receive a dose on or after her 16th birthday. Adults who have certain high-risk conditions should receive one or more doses of vaccine.  Hepatitis A vaccine. Adults who wish to be protected from this disease, have certain high-risk conditions, work with hepatitis A-infected animals, work in hepatitis A research labs, or travel to or work in countries with a high rate of hepatitis A should be immunized. Adults who were previously unvaccinated and who anticipate close contact with an international adoptee during the first 60 days after arrival in the [place] from a country with a high rate of hepatitis A should be immunized.  Hepatitis B vaccine. Adults who wish to be protected from this disease, have certain high-risk conditions, may be exposed to blood or other infectious body fluids, are household contacts or sex partners of hepatitis B positive people, are clients or workers in certain care facilities, or travel to or work in countries with a high rate of hepatitis B should be immunized.  Haemophilus influenzae type b (Hib) vaccine. A previously unvaccinated person with asplenia or sickle cell disease or having a scheduled splenectomy should receive 1 dose of Hib vaccine. Regardless of previous immunization, a recipient of  a hematopoietic stem cell transplant should receive a 3-dose series 6-12 months after her successful transplant. Hib vaccine is not recommended for adults with HIV infection. Preventive Services / Frequency Ages 82 to 82 years  Blood pressure check.** / Every 1 to 2 years.  Lipid and cholesterol check.** / Every 5 years beginning at age 68.  Clinical breast exam.** / Every 3 years for women in their 83s and 52s.  BRCA-related cancer risk assessment.** / For women who have family members with a BRCA-related cancer (breast, ovarian, tubal, or peritoneal cancers).  Pap test.** / Every 2 years from ages 33 through 11. Every 3 years starting at age 55 through age 84 or 64 with a history of 3 consecutive normal Pap tests.  HPV screening.** / Every 3 years from ages 29 through ages 64 to 57 with a history of 3 consecutive normal Pap tests.  Hepatitis C blood test.** / For any individual with known risks for hepatitis C.  Skin self-exam. / Monthly.  Influenza vaccine. / Every year.  Tetanus, diphtheria, and acellular pertussis (Tdap, Td) vaccine.** / Consult your health care provider. Pregnant women should receive 1 dose of Tdap vaccine during each pregnancy. 1 dose of Td every 10 years.  Varicella vaccine.** / Consult your health care provider. Pregnant females who do not have evidence of immunity should receive the first dose after pregnancy.  HPV vaccine. / 3 doses over 6 months, if 90 and younger. The vaccine is not recommended for use in pregnant females. However, pregnancy testing is not needed before receiving a dose.  Measles, mumps, rubella (MMR) vaccine.** / You need at least 1 dose of MMR if you were born in 1957 or later. You may also need a 2nd dose. For females of childbearing age, rubella immunity should be determined. If there is no evidence of immunity, females who  are not pregnant should be vaccinated. If there is no evidence of immunity, females who are pregnant should delay  immunization until after pregnancy.  Pneumococcal 13-valent conjugate (PCV13) vaccine.** / Consult your health care provider.  Pneumococcal polysaccharide (PPSV23) vaccine.** / 1 to 2 doses if you smoke cigarettes or if you have certain conditions.  Meningococcal vaccine.** / 1 dose if you are age 31 to 63 years and a Market researcher living in a residence hall, or have one of several medical conditions, you need to get vaccinated against meningococcal disease. You may also need additional booster doses.  Hepatitis A vaccine.** / Consult your health care provider.  Hepatitis B vaccine.** / Consult your health care provider.  Haemophilus influenzae type b (Hib) vaccine.** / Consult your health care provider. Ages 103 to 25 years  Blood pressure check.** / Every 1 to 2 years.  Lipid and cholesterol check.** / Every 5 years beginning at age 80 years.  Lung cancer screening. / Every year if you are aged 102-80 years and have a 30-pack-year history of smoking and currently smoke or have quit within the past 15 years. Yearly screening is stopped once you have quit smoking for at least 15 years or develop a health problem that would prevent you from having lung cancer treatment.  Clinical breast exam.** / Every year after age 29 years.  BRCA-related cancer risk assessment.** / For women who have family members with a BRCA-related cancer (breast, ovarian, tubal, or peritoneal cancers).  Mammogram.** / Every year beginning at age 66 years and continuing for as long as you are in good health. Consult with your health care provider.  Pap test.** / Every 3 years starting at age 99 years through age 74 or 90 years with a history of 3 consecutive normal Pap tests.  HPV screening.** / Every 3 years from ages 37 years through ages 41 to 52 years with a history of 3 consecutive normal Pap tests.  Fecal occult blood test (FOBT) of stool. / Every year beginning at age 14 years and continuing  until age 26 years. You may not need to do this test if you get a colonoscopy every 10 years.  Flexible sigmoidoscopy or colonoscopy.** / Every 5 years for a flexible sigmoidoscopy or every 10 years for a colonoscopy beginning at age 41 years and continuing until age 86 years.  Hepatitis C blood test.** / For all people born from [birth year] through 1964 and any individual with known risks for hepatitis C.  Skin self-exam. / Monthly.  Influenza vaccine. / Every year.  Tetanus, diphtheria, and acellular pertussis (Tdap/Td) vaccine.** / Consult your health care provider. Pregnant women should receive 1 dose of Tdap vaccine during each pregnancy. 1 dose of Td every 10 years.  Varicella vaccine.** / Consult your health care provider. Pregnant females who do not have evidence of immunity should receive the first dose after pregnancy.  Zoster vaccine.** / 1 dose for adults aged 67 years or older.  Measles, mumps, rubella (MMR) vaccine.** / You need at least 1 dose of MMR if you were born in 1957 or later. You may also need a 2nd dose. For females of childbearing age, rubella immunity should be determined. If there is no evidence of immunity, females who are not pregnant should be vaccinated. If there is no evidence of immunity, females who are pregnant should delay immunization until after pregnancy.  Pneumococcal 13-valent conjugate (PCV13) vaccine.** / Consult your health care provider.  Pneumococcal polysaccharide (PPSV23) vaccine.** /  1 to 2 doses if you smoke cigarettes or if you have certain conditions.  Meningococcal vaccine.** / Consult your health care provider.  Hepatitis A vaccine.** / Consult your health care provider.  Hepatitis B vaccine.** / Consult your health care provider.  Haemophilus influenzae type b (Hib) vaccine.** / Consult your health care provider. Ages 20 years and over  Blood pressure check.** / Every 1 to 2 years.  Lipid and cholesterol check.** / Every 5 years  beginning at age 22 years.  Lung cancer screening. / Every year if you are aged 18-80 years and have a 30-pack-year history of smoking and currently smoke or have quit within the past 15 years. Yearly screening is stopped once you have quit smoking for at least 15 years or develop a health problem that would prevent you from having lung cancer treatment.  Clinical breast exam.** / Every year after age 19 years.  BRCA-related cancer risk assessment.** / For women who have family members with a BRCA-related cancer (breast, ovarian, tubal, or peritoneal cancers).  Mammogram.** / Every year beginning at age 56 years and continuing for as long as you are in good health. Consult with your health care provider.  Pap test.** / Every 3 years starting at age 87 years through age 55 or 34 years with 3 consecutive normal Pap tests. Testing can be stopped between 65 and 70 years with 3 consecutive normal Pap tests and no abnormal Pap or HPV tests in the past 10 years.  HPV screening.** / Every 3 years from ages 78 years through ages 45 or 63 years with a history of 3 consecutive normal Pap tests. Testing can be stopped between 65 and 70 years with 3 consecutive normal Pap tests and no abnormal Pap or HPV tests in the past 10 years.  Fecal occult blood test (FOBT) of stool. / Every year beginning at age 30 years and continuing until age 37 years. You may not need to do this test if you get a colonoscopy every 10 years.  Flexible sigmoidoscopy or colonoscopy.** / Every 5 years for a flexible sigmoidoscopy or every 10 years for a colonoscopy beginning at age 88 years and continuing until age 56 years.  Hepatitis C blood test.** / For all people born from [birth year] through 1964 and any individual with known risks for hepatitis C.  Osteoporosis screening.** / A one-time screening for women ages 63 years and over and women at risk for fractures or osteoporosis.  Skin self-exam. / Monthly.  Influenza vaccine. /  Every year.  Tetanus, diphtheria, and acellular pertussis (Tdap/Td) vaccine.** / 1 dose of Td every 10 years.  Varicella vaccine.** / Consult your health care provider.  Zoster vaccine.** / 1 dose for adults aged 1 years or older.  Pneumococcal 13-valent conjugate (PCV13) vaccine.** / Consult your health care provider.  Pneumococcal polysaccharide (PPSV23) vaccine.** / 1 dose for all adults aged 33 years and older.  Meningococcal vaccine.** / Consult your health care provider.  Hepatitis A vaccine.** / Consult your health care provider.  Hepatitis B vaccine.** / Consult your health care provider.  Haemophilus influenzae type b (Hib) vaccine.** / Consult your health care provider. ** Family history and personal history of risk and conditions may change your health care provider's recommendations. Document Released: 12/25/2001 Document Revised: 03/15/2014 Document Reviewed: 03/26/2011 [facility] Patient Information 2015 [place], [place]. This information is not intended to replace advice given to you by your health care provider. Make sure you discuss any questions you have with  your health care provider.

## 2015-06-10 NOTE — Progress Notes (Signed)
Anne [name]  [id number] Apr 07, 1941 06/10/2015      Progress Note-Follow Up  Subjective  Chief Complaint  Chief Complaint  Patient presents with  . Annual Exam    non-fasting    HPI  Patient is a 74 y.o. female in today for routine medical care. Patient is in today for annual exam and follow-up on numerous chronic concerns.She is presently being treated for bronchitisand improving.Her cough is still productive of yellow phlegm butit is improving with treatment. She notes worsening shortness of breath over several months since prior to congestion. No other reo new acute concerns otherwise. Denies CP/palp/HA/congestion/fevers/GI or GU c/o. Taking meds as prescribed  Past Medical History  Diagnosis Date  . Anemia   . COPD (chronic obstructive pulmonary disease)   . Hyperlipidemia   . Pneumonia 2-10    pleurisy  . Arthritis   . Depression   . GERD (gastroesophageal reflux disease) 09/29/2009    improved s/p cholecystectomy and esophagus dilatation  . History of chicken pox   . History of shingles     2 episodes  . History of measles   . Osteopenia 03/14/2011  . 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 Pure hypercholesterolemia 10/17/2010  . 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 ESOPHAGEAL STRICTURE 03/29/2009  . CAROTID ARTERY STENOSIS 01/13/2010  . ALKALINE PHOSPHATASE, ELEVATED 03/15/2009  . Anxiety and depression 04/28/2011  . Vaginitis 05/22/2011  . Baker's cyst of knee 05/22/2011  . Trauma 10/18/2011  . Radial neck fracture 10/2011    minimally displaced  . Atypical chest pain 11/30/2011  . Folliculitis of nose 0/53/9767  . Esophageal ring   . 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 Urinary incontinence 03/19/2012  . Allergic state 06/10/2012  . Hematuria   . Dysphagia   . URI (upper respiratory infection) 09/02/2012  . Preventative health care 10/21/2012  . Dermatitis 11/23/2012  . Fall 11/23/2012  . Hx of echocardiogram     a.  Echo 01/2103: mild LVH, EF 55-60%, normal wall motion, Gr 1 diast dysfn  . Knee pain, bilateral 07/23/2011  . Elevated sed rate 08/02/2013  . EE (eosinophilic esophagitis)   . Urinary frequency 12/12/2014  . 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    . Mixed hyperlipidemia 10/17/2010    Qualifier: Diagnosis of  By: Mack Guise      Past Surgical History  Procedure Laterality Date  . Anterior cruciate ligament repair  08, 09, 10  . Ercp, cbd stone extraction  2010  . Cholecystectomy  2010  . Egd/ dili  2010, 2012  . Breast surgery  2001    lumpectomy (01)  . Colonoscopy  09/05/10    cecal avm's  . Esophagogastroduodenoscopy  01/08/2012    Procedure: ESOPHAGOGASTRODUODENOSCOPY (EGD);  Surgeon: Gatha Mayer, MD;  Location: [facility];  Service: Endoscopy;  Laterality: N/A;  . Savory dilation  01/08/2012    Procedure: SAVORY DILATION;  Surgeon: Gatha Mayer, MD;  Location: [facility];  Service: Endoscopy;  Laterality: N/A;  need xray  . Mastectomy modified radical      , Mastectomy modified radical (08), breast reconstruction, CA lesions excised lateral abd wall 2010  . Breast reconstructive surgery  2008, 2009, 2010    Family History  Problem Relation Age of Onset  . Heart disease Father   . Lung cancer Father  smoker  . Cirrhosis Sister     Primary Biliary  . Breast cancer Maternal Aunt     x 3  . Stroke Maternal Grandmother   . Alcohol abuse Maternal Grandfather   . Heart disease Paternal Grandfather   . Anxiety disorder Sister   . Osteoporosis Sister   . Arthritis Sister     Rheumatoid  . Osteoporosis Sister   . Cancer Sister     multiple skin cancers, over 62 excisions.  . Other Mother     tic deleauroux  . Anesthesia problems Neg Hx   . Hypotension Neg Hx   . Malignant hyperthermia Neg Hx   . Pseudochol deficiency Neg Hx   . Colon cancer Neg Hx   . Ovarian cancer Neg Hx   .  Pancreatic cancer Neg Hx   . Breast cancer Paternal Aunt     History   Social History  . Marital Status: Married    Spouse Name: N/A  . Number of Children: N/A  . Years of Education: N/A   Occupational History  . Not on file.   Social History Main Topics  . Smoking status: Former Smoker    Types: Cigarettes    Quit date: 05/22/2007  . Smokeless tobacco: Never Used  . Alcohol Use: 0.6 oz/week    1 Glasses of wine per week     Comment: at least 6 oz daily  . Drug Use: No  . Sexual Activity:    Partners: Male     Comment: lives with husband,    Other Topics Concern  . Not on file   Social History Narrative    Current Outpatient Prescriptions on File Prior to Visit  Medication Sig Dispense Refill  . albuterol (PROVENTIL HFA;VENTOLIN HFA) 108 (90 BASE) MCG/ACT inhaler Inhale 2 puffs into the lungs every 6 (six) hours as needed for wheezing or shortness of breath. 1 Inhaler 0  . amitriptyline (ELAVIL) 150 MG tablet Take 1 tablet (150 mg total) by mouth at bedtime. 30 tablet 2  . aspirin 81 MG tablet Take 81 mg by mouth daily.      [name] atorvastatin (LIPITOR) 40 MG tablet TAKE 1 TABLET EVERY DAY 90 tablet 3  . azithromycin (ZITHROMAX) 250 MG tablet Take 2 tablets on Day 1. Then take 1 tablet daily. 6 tablet 0  . benzonatate (TESSALON) 200 MG capsule Take 1 capsule (200 mg total) by mouth 2 (two) times daily as needed for cough. 20 capsule 0  . Cholecalciferol (VITAMIN D3) 1000 UNITS CAPS Take 2 tablets by mouth daily.      . hyoscyamine (LEVSIN SL) 0.125 MG SL tablet Place 1 tablet (0.125 mg total) under the tongue every 4 (four) hours as needed. 30 tablet 2  . loratadine (CLARITIN) 10 MG tablet Take 10 mg by mouth daily.    [name] LORazepam (ATIVAN) 1 MG tablet take 1 tablet by mouth every 8 hours if needed for anxiety 60 tablet 2  . pantoprazole (PROTONIX) 40 MG tablet Take 1 tablet (40 mg total) by mouth 2 (two) times daily. 180 tablet 3  . Probiotic Product (PROBIOTIC FORMULA PO)  Take 1 capsule by mouth daily.    . vitamin B-12 (CYANOCOBALAMIN) 1000 MCG tablet Take 1,000 mcg by mouth daily.       No current facility-administered medications on file prior to visit.    Allergies  Allergen Reactions  . Codeine Other (See Comments)    "flu like symptoms"  . Diphenhydramine Hcl Other (See  Comments)    "nervous and upset"  . Grapeseed Extract [Nutritional Supplements]     GRAPES  . Morphine Other (See Comments)    "flu like symptoms"  . Peanut-Containing Drug Products Hives  . Tomato Diarrhea  . Zofran Other (See Comments)    HEADACHE    Review of Systems  Review of Systems  Constitutional: Negative for fever, chills and malaise/fatigue.  HENT: Positive for congestion. Negative for hearing loss and nosebleeds.   Eyes: Negative for discharge.  Respiratory: Positive for cough. Negative for sputum production, shortness of breath and wheezing.   Cardiovascular: Negative for chest pain, palpitations and leg swelling.  Gastrointestinal: Negative for heartburn, nausea, vomiting, abdominal pain, diarrhea, constipation and blood in stool.  Genitourinary: Negative for dysuria, urgency, frequency and hematuria.  Musculoskeletal: Negative for myalgias, back pain and falls.  Skin: Negative for rash.  Neurological: Negative for dizziness, tremors, sensory change, focal weakness, loss of consciousness, weakness and headaches.  Endo/Heme/Allergies: Negative for polydipsia. Does not bruise/bleed easily.  Psychiatric/Behavioral: Negative for depression and suicidal ideas. The patient is not nervous/anxious and does not have insomnia.     Objective  BP 98/64 mmHg  Pulse 92  Temp(Src) 98.2 F (36.8 C) (Oral)  Resp 16  Wt 127 lb 12.8 oz (57.97 kg)  SpO2 97%  Physical Exam  Physical Exam  Constitutional: She is oriented to person, place, and time and well-developed, well-nourished, and in no distress. No distress.  HENT:  Head: Normocephalic and atraumatic.  Right  Ear: External ear normal.  Left Ear: External ear normal.  Nose: Nose normal.  Mouth/Throat: Oropharynx is clear and moist. No oropharyngeal exudate.  Eyes: Conjunctivae are normal. Pupils are equal, round, and reactive to light. Right eye exhibits no discharge. Left eye exhibits no discharge. No scleral icterus.  Neck: Normal range of motion. Neck supple. No thyromegaly present.  Cardiovascular: Normal rate, regular rhythm, normal heart sounds and intact distal pulses.   No murmur heard. Pulmonary/Chest: Effort normal and breath sounds normal. No respiratory distress. She has no wheezes. She has no rales.  Abdominal: Soft. Bowel sounds are normal. She exhibits no distension and no mass. There is no tenderness.  Musculoskeletal: Normal range of motion. She exhibits no edema or tenderness.  Lymphadenopathy:    She has no cervical adenopathy.  Neurological: She is alert and oriented to person, place, and time. She has normal reflexes. No cranial nerve deficit. Coordination normal.  Skin: Skin is warm and dry. No rash noted. She is not diaphoretic.  Psychiatric: Mood, memory and affect normal.    Lab Results  Component Value Date   TSH 3.02 12/07/2014   Lab Results  Component Value Date   WBC 5.9 05/09/2015   HGB 12.1 05/09/2015   HCT 37.2 05/09/2015   MCV 92.7 05/09/2015   PLT 277 05/09/2015   Lab Results  Component Value Date   CREATININE 0.8 05/09/2015   BUN 16.2 05/09/2015   NA 142 05/09/2015   K 3.5 05/09/2015   CL 101 12/07/2014   CO2 30* 05/09/2015   Lab Results  Component Value Date   ALT 26 05/09/2015   AST 20 05/09/2015   ALKPHOS 131 05/09/2015   BILITOT 0.49 05/09/2015   Lab Results  Component Value Date   CHOL 192 12/07/2014   Lab Results  Component Value Date   HDL 66.40 12/07/2014   Lab Results  Component Value Date   LDLCALC 87 12/07/2014   Lab Results  Component Value Date  TRIG 193.0* 12/07/2014   Lab Results  Component Value Date    CHOLHDL 3 12/07/2014     Assessment & Plan  Tachycardia Mild, minimize caffeine and alcohol and monitor  Osteopenia Continue vitamin D and calcium supplements and continue to exercise  Mixed hyperlipidemia Tolerating statin, encouraged heart healthy diet, avoid trans fats, minimize simple carbs and saturated fats. Increase exercise as tolerated  Acute bacterial bronchitis Symptoms improving with treatment  Shortness of breath With numerous risk factors for heart disease. Patient very anxious about her symptoms and has a history of carotid artery stenosis referred to cardiology for further consideration  Medicare annual wellness visit, subsequent Patient denies any difficulties at home. No trouble with ADLs, depression or falls. No recent changes to vision or hearing. Is UTD with immunizations. Is UTD with screening. Discussed Advanced Directives, patient agrees to bring [place] copies of documents if can. Encouraged heart healthy diet, exercise as tolerated and adequate sleep. Labs ordered and reviewed. Colonoscopy in 2006 due for follow up later this year MGM 02/24/2015, normal, continue with annual Dexa scan 02/15/2014 repeat in 2-5 years. See care team function for list of providers patient sees.  See problem list for risk factors.  See AVS for preventative health schedule.

## 2015-06-15 ENCOUNTER — Telehealth: Payer: Self-pay | Admitting: Internal Medicine

## 2015-06-15 NOTE — Telephone Encounter (Signed)
New message  Pt called requests to sche a Carotid with Dr. Harrington Challenger in Office. Pt has appt scheduled for 10/03 with Dr. Harrington Challenger. Referral from [facility] for SOB. Pt declined PA or NP and request to see Dr. Harrington Challenger only. Please assist with order for Carotid per pt's request

## 2015-06-15 NOTE — Telephone Encounter (Signed)
Informed patient that according to last carotid U/S that she is due for a recheck in May 2017. Patient has the earliest appointment with Dr. Harrington Challenger in 10/03. When looking at [facility] office note (06/08/2015) patient had a dx of acute bacterial bronchitis and was prescribed Azithromycin, Albuterol, and Tessalon. Patient stated that she is better, but still has some SOB. Patient is to follow up with PCP if symptoms worsen. Patient also followed up with PCP on (06/10/2015), recommend patient to call PCP with any pulmonary issues and to keep her appointment with Dr. Harrington Challenger on 10/03. Patient could not continue conversation, that she was in a hurry and had to go. Encouraged patient to follow up with PCP and call office with any other concerns or questions.

## 2015-06-19 ENCOUNTER — Encounter: Payer: Self-pay | Admitting: Family Medicine

## 2015-06-19 DIAGNOSIS — R0600 Dyspnea, unspecified: Secondary | ICD-10-CM | POA: Insufficient documentation

## 2015-06-19 DIAGNOSIS — Z0000 Encounter for general adult medical examination without abnormal findings: Secondary | ICD-10-CM

## 2015-06-19 HISTORY — DX: Encounter for general adult medical examination without abnormal findings: Z00.00

## 2015-06-19 NOTE — Assessment & Plan Note (Signed)
Continue vitamin D and calcium supplements and continue to exercise

## 2015-06-19 NOTE — Assessment & Plan Note (Signed)
Tolerating statin, encouraged heart healthy diet, avoid trans fats, minimize simple carbs and saturated fats. Increase exercise as tolerated 

## 2015-06-19 NOTE — Assessment & Plan Note (Signed)
Patient denies any difficulties at home. No trouble with ADLs, depression or falls. No recent changes to vision or hearing. Is UTD with immunizations. Is UTD with screening. Discussed Advanced Directives, patient agrees to bring [place] copies of documents if can. Encouraged heart healthy diet, exercise as tolerated and adequate sleep. Labs ordered and reviewed. Colonoscopy in 2006 due for follow up later this year MGM 02/24/2015, normal, continue with annual Dexa scan 02/15/2014 repeat in 2-5 years. See care team function for list of providers patient sees.  See problem list for risk factors.  See AVS for preventative health schedule.

## 2015-06-19 NOTE — Assessment & Plan Note (Signed)
Symptoms improving with treatment

## 2015-06-19 NOTE — Assessment & Plan Note (Signed)
Mild, minimize caffeine and alcohol and monitor

## 2015-06-19 NOTE — Assessment & Plan Note (Signed)
With numerous risk factors for heart disease. Patient very anxious about her symptoms and has a history of carotid artery stenosis referred to cardiology for further consideration

## 2015-06-22 ENCOUNTER — Encounter: Payer: Self-pay | Admitting: Internal Medicine

## 2015-06-24 ENCOUNTER — Other Ambulatory Visit: Payer: Self-pay | Admitting: Family Medicine

## 2015-06-26 NOTE — Telephone Encounter (Signed)
Please print the Lorazepam for signature, same sig, same number 2 rfr

## 2015-06-27 NOTE — Telephone Encounter (Signed)
Faxed hardcopy for Lorazepam to [employer]

## 2015-06-27 NOTE — Telephone Encounter (Signed)
Printed and on counter for signature. 

## 2015-07-06 ENCOUNTER — Encounter: Payer: Self-pay | Admitting: Physician Assistant

## 2015-07-06 ENCOUNTER — Ambulatory Visit (INDEPENDENT_AMBULATORY_CARE_PROVIDER_SITE_OTHER): Payer: Commercial Managed Care - HMO | Admitting: Physician Assistant

## 2015-07-06 VITALS — BP 98/64 | HR 91 | Temp 98.3°F | Resp 16 | Ht 66.0 in | Wt 127.2 lb

## 2015-07-06 DIAGNOSIS — T148 Other injury of unspecified body region: Secondary | ICD-10-CM

## 2015-07-06 DIAGNOSIS — W57XXXA Bitten or stung by nonvenomous insect and other nonvenomous arthropods, initial encounter: Secondary | ICD-10-CM | POA: Insufficient documentation

## 2015-07-06 NOTE — Progress Notes (Signed)
Pre visit review using our clinic review tool, if applicable. No additional management support is needed unless otherwise documented below in the visit note/SLS  

## 2015-07-06 NOTE — Patient Instructions (Signed)
Please keep area clean and dry. Avoid using bandaids to the area because it seems they have left a small rash. Continue the triamcinolone cream twice daily to help resolve the allergic rash. The area of concern itself is not actively infected. Is some residual blood vessel breakdown and post-inflammatory hyperpigmentation from probable bite that will resolve over the next few weeks.  If you notice any new or recurrent symptoms, come see me.

## 2015-07-07 NOTE — Progress Notes (Signed)
Patient presents to clinic today c/o "wound" from an insect bite x 5 days. States area was initially pruritic but has resolved. Denies drainage or pain. Denies fever, chills, malaise. Has been scratching the area.  Past Medical History  Diagnosis Date  . Anemia   . COPD (chronic obstructive pulmonary disease)   . Hyperlipidemia   . Pneumonia 2-10    pleurisy  . Arthritis   . Depression   . GERD (gastroesophageal reflux disease) 09/29/2009    improved s/p cholecystectomy and esophagus dilatation  . History of chicken pox   . History of shingles     2 episodes  . History of measles   . Osteopenia 03/14/2011  . 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 Pure hypercholesterolemia 10/17/2010  . 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 ESOPHAGEAL STRICTURE 03/29/2009  . CAROTID ARTERY STENOSIS 01/13/2010  . ALKALINE PHOSPHATASE, ELEVATED 03/15/2009  . Anxiety and depression 04/28/2011  . Vaginitis 05/22/2011  . Baker's cyst of knee 05/22/2011  . Trauma 10/18/2011  . Radial neck fracture 10/2011    minimally displaced  . Atypical chest pain 11/30/2011  . Folliculitis of nose 1/61/0960  . Esophageal ring   . 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 Urinary incontinence 03/19/2012  . Allergic state 06/10/2012  . Hematuria   . Dysphagia   . URI (upper respiratory infection) 09/02/2012  . Preventative health care 10/21/2012  . Dermatitis 11/23/2012  . Fall 11/23/2012  . Hx of echocardiogram     a. Echo 01/2103: mild LVH, EF 55-60%, normal wall motion, Gr 1 diast dysfn  . Knee pain, bilateral 07/23/2011  . Elevated sed rate 08/02/2013  . EE (eosinophilic esophagitis)   . Urinary frequency 12/12/2014  . 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    . Mixed hyperlipidemia 10/17/2010    Qualifier: Diagnosis of  By: Mack Guise    . Medicare annual wellness visit, subsequent 06/19/2015     Current Outpatient Prescriptions on File Prior to Visit  Medication Sig Dispense Refill  . amitriptyline (ELAVIL) 150 MG tablet Take 1 tablet (150 mg total) by mouth at bedtime. 30 tablet 2  . aspirin 81 MG tablet Take 81 mg by mouth daily.      [name] atorvastatin (LIPITOR) 40 MG tablet TAKE 1 TABLET EVERY DAY 90 tablet 3  . Cholecalciferol (VITAMIN D3) 1000 UNITS CAPS Take 2 tablets by mouth daily.      . hyoscyamine (LEVSIN SL) 0.125 MG SL tablet Place 1 tablet (0.125 mg total) under the tongue every 4 (four) hours as needed. 30 tablet 2  . loratadine (CLARITIN) 10 MG tablet Take 10 mg by mouth daily.    [name] LORazepam (ATIVAN) 1 MG tablet take 1 tablet by mouth every 8 hours if needed for anxiety 70 tablet 2  . pantoprazole (PROTONIX) 40 MG tablet Take 1 tablet (40 mg total) by mouth 2 (two) times daily. 180 tablet 3  . Probiotic Product (PROBIOTIC FORMULA PO) Take 1 capsule by mouth daily.    . vitamin B-12 (CYANOCOBALAMIN) 1000 MCG tablet Take 1,000 mcg by mouth daily.       No current facility-administered medications on file prior to visit.    Allergies  Allergen Reactions  . Codeine Other (See Comments)    "flu like symptoms"  . Diphenhydramine Hcl Other (See Comments)    "  nervous and upset"  . Morphine Other (See Comments)    "flu like symptoms"  . Peanut-Containing Drug Products Hives  . Sorbitol Other (See Comments)    GI Issues  . Tomato Diarrhea  . Zofran Other (See Comments)    HEADACHE    Family History  Problem Relation Age of Onset  . Heart disease Father   . Lung cancer Father     smoker  . Cirrhosis Sister     Primary Biliary  . Breast cancer Maternal Aunt     x 3  . Stroke Maternal Grandmother   . Alcohol abuse Maternal Grandfather   . Heart disease Paternal Grandfather   . Anxiety disorder Sister   . Osteoporosis Sister   . Arthritis Sister     Rheumatoid  . Osteoporosis Sister   . Cancer Sister     multiple skin cancers, over 48 excisions.  .  Other Mother     tic deleauroux  . Anesthesia problems Neg Hx   . Hypotension Neg Hx   . Malignant hyperthermia Neg Hx   . Pseudochol deficiency Neg Hx   . Colon cancer Neg Hx   . Ovarian cancer Neg Hx   . Pancreatic cancer Neg Hx   . Breast cancer Paternal Aunt     Social History   Social History  . Marital Status: Married    Spouse Name: N/A  . Number of Children: N/A  . Years of Education: N/A   Social History Main Topics  . Smoking status: Former Smoker    Types: Cigarettes    Quit date: 05/22/2007  . Smokeless tobacco: Never Used  . Alcohol Use: 0.6 oz/week    1 Glasses of wine per week     Comment: at least 6 oz daily  . Drug Use: No  . Sexual Activity:    Partners: Male     Comment: lives with husband,    Other Topics Concern  . None   Social History Narrative    Review of Systems - See HPI.  All other ROS are negative.  BP 98/64 mmHg  Pulse 91  Temp(Src) 98.3 F (36.8 C) (Oral)  Resp 16  Ht _0  (1.676 m)  Wt 127 lb 4 oz (57.72 kg)  BMI 20.55 kg/m2  SpO2 97%  Physical Exam  Constitutional: She is well-developed, well-nourished, and in no distress.  HENT:  Head: Normocephalic and atraumatic.  Eyes: Conjunctivae are normal.  Cardiovascular: Normal rate.   Pulmonary/Chest: Effort normal and breath sounds normal.  Skin:     Vitals reviewed.   Recent Results (from the past 2160 hour(s))  CBC with Differential     Status: None   Collection Time: 05/09/15  2:42 PM  Result Value Ref Range   WBC 5.9 3.9 - 10.3 10e3/uL   NEUT# 3.9 1.5 - 6.5 10e3/uL   HGB 12.1 11.6 - 15.9 g/dL   HCT 37.2 34.8 - 46.6 %   Platelets 277 145 - 400 10e3/uL   MCV 92.7 79.5 - 101.0 fL   MCH 30.1 25.1 - 34.0 pg   MCHC 32.5 31.5 - 36.0 g/dL   RBC 4.01 3.70 - 5.45 10e6/uL   RDW 14.0 11.2 - 14.5 %   lymph# 1.5 0.9 - 3.3 10e3/uL   MONO# 0.4 0.1 - 0.9 10e3/uL   Eosinophils Absolute 0.1 0.0 - 0.5 10e3/uL   Basophils Absolute 0.0 0.0 - 0.1 10e3/uL   NEUT% 66.0 38.4 -  76.8 %   LYMPH% 24.9  14.0 - 49.7 %   MONO% 6.5 0.0 - 14.0 %   EOS% 1.8 0.0 - 7.0 %   BASO% 0.8 0.0 - 2.0 %  Comprehensive metabolic panel (Cmet) - [facility]     Status: Abnormal   Collection Time: 05/09/15  2:42 PM  Result Value Ref Range   Sodium 142 136 - 145 mEq/L   Potassium 3.5 3.5 - 5.1 mEq/L   Chloride 105 98 - 109 mEq/L   CO2 30 (H) 22 - 29 mEq/L   Glucose 82 70 - 140 mg/dl   BUN 16.2 7.0 - 26.0 mg/dL   Creatinine 0.8 0.6 - 1.1 mg/dL   Total Bilirubin 0.49 0.20 - 1.20 mg/dL   Alkaline Phosphatase 131 40 - 150 U/L   AST 20 5 - 34 U/L   ALT 26 0 - 55 U/L   Total Protein 7.1 6.4 - 8.3 g/dL   Albumin 3.8 3.5 - 5.0 g/dL   Calcium 9.6 8.4 - 10.4 mg/dL   Anion Gap 8 3 - 11 mEq/L   EGFR 71 (L) >90 ml/min/1.73 m2    Comment: eGFR is calculated using the CKD-EPI Creatinine Equation (2009)  Comprehensive metabolic panel     Status: Abnormal   Collection Time: 06/10/15 12:16 PM  Result Value Ref Range   Sodium 140 135 - 145 mEq/L   Potassium 3.8 3.5 - 5.1 mEq/L   Chloride 101 96 - 112 mEq/L   CO2 31 19 - 32 mEq/L   Glucose, Bld 76 70 - 99 mg/dL   BUN 15 6 - 23 mg/dL   Creatinine, Ser 0.73 0.40 - 1.20 mg/dL   Total Bilirubin 0.3 0.2 - 1.2 mg/dL   Alkaline Phosphatase 130 (H) 39 - 117 U/L   AST 34 0 - 37 U/L   ALT 47 (H) 0 - 35 U/L   Total Protein 7.5 6.0 - 8.3 g/dL   Albumin 4.1 3.5 - 5.2 g/dL   Calcium 9.5 8.4 - 10.5 mg/dL   GFR 83.12 >60.00 mL/min  CBC     Status: None   Collection Time: 06/10/15 12:16 PM  Result Value Ref Range   WBC 5.5 4.0 - 10.5 K/uL   RBC 4.19 3.87 - 5.11 Mil/uL   Platelets 274.0 150.0 - 400.0 K/uL   Hemoglobin 12.7 12.0 - 15.0 g/dL   HCT 38.4 36.0 - 46.0 %   MCV 91.5 78.0 - 100.0 fl   MCHC 33.0 30.0 - 36.0 g/dL   RDW 15.0 11.5 - 15.5 %  Lipid panel     Status: None   Collection Time: 06/10/15 12:16 PM  Result Value Ref Range   Cholesterol 153 0 - 200 mg/dL    Comment: ATP III Classification       Desirable:  < 200 mg/dL               Borderline  High:  200 - 239 mg/dL          High:  > = 240 mg/dL   Triglycerides 100.0 0.0 - 149.0 mg/dL    Comment: Normal:  <150 mg/dLBorderline High:  150 - 199 mg/dL   HDL 61.60 >39.00 mg/dL   VLDL 20.0 0.0 - 40.0 mg/dL   LDL Cholesterol 72 0 - 99 mg/dL   Total CHOL/HDL Ratio 2     Comment:                Men          Women1/2 Average Risk  3.4          3.3Average Risk          5.0          4.42X Average Risk          9.6          7.13X Average Risk          15.0          11.0                       NonHDL 91.51     Comment: NOTE:  Non-HDL goal should be 30 mg/dL higher than patient's LDL goal (i.e. LDL goal of < 70 mg/dL, would have non-HDL goal of < 100 mg/dL)  TSH     Status: None   Collection Time: 06/10/15 12:16 PM  Result Value Ref Range   TSH 3.44 0.35 - 4.50 uIU/mL    Assessment/Plan: Insect bite No evidence of wound or infection. Some postinflammatory hyperpigmentation noted. Also allergic contact rash noted. Mild. Continue Kenalog BID. No more use of band-aids. Keep skin moisturized. Hyperpigmentation should resolve over newt few weeks.

## 2015-07-07 NOTE — Assessment & Plan Note (Signed)
No evidence of wound or infection. Some postinflammatory hyperpigmentation noted. Also allergic contact rash noted. Mild. Continue Kenalog BID. No more use of band-aids. Keep skin moisturized. Hyperpigmentation should resolve over newt few weeks.

## 2015-07-12 ENCOUNTER — Encounter: Payer: Self-pay | Admitting: Oncology

## 2015-07-15 NOTE — Telephone Encounter (Addendum)
Left a phone message on 07-15-15 on home voice mail stating that Dr. Marko Plume will return to the office next week and this message will be given to her to review. She will  be called with reply as she did not read the reply  sent by this nurse dated 06-10-15  for the MyChart message she  sent on 06-06-15. A notification was sent in the My chart system that the 06-10-15 message was not read.

## 2015-07-19 ENCOUNTER — Other Ambulatory Visit: Payer: Self-pay | Admitting: Oncology

## 2015-07-19 ENCOUNTER — Telehealth: Payer: Self-pay | Admitting: Oncology

## 2015-07-19 NOTE — Telephone Encounter (Signed)
Dr. Marko Plume called Ms. Marty on 07-19-15.

## 2015-07-19 NOTE — Telephone Encounter (Signed)
MEDICAL ONCOLOGY  Returned phone call to patient from My Chart message, as she apparently did not get reply on My Chart from a previous inquiry.  Told patient that generally bone pain related to cancer is persistent, not intermittent as she describes present bilateral hip discomfort. Told her that low bone density in absence of fracture is usually not painful. Told her that certainly she can have DEXA done prior to 2 years, but likely insurance would not cover it if so (last 02-2014). Suggested that is the hip symptoms are bothersome, she could discuss with PCP or the orthopedist that she knows.   Patient expressed appreciation for call.  Godfrey Pick, MD

## 2015-08-11 ENCOUNTER — Encounter: Payer: Self-pay | Admitting: *Deleted

## 2015-08-15 ENCOUNTER — Ambulatory Visit (INDEPENDENT_AMBULATORY_CARE_PROVIDER_SITE_OTHER): Payer: Commercial Managed Care - HMO | Admitting: Internal Medicine

## 2015-08-15 ENCOUNTER — Encounter: Payer: Self-pay | Admitting: Internal Medicine

## 2015-08-15 VITALS — BP 100/66 | HR 86 | Ht 66.0 in | Wt 128.8 lb

## 2015-08-15 DIAGNOSIS — E782 Mixed hyperlipidemia: Secondary | ICD-10-CM | POA: Diagnosis not present

## 2015-08-15 NOTE — Progress Notes (Signed)
Cardiology Office Note   Date:  08/15/2015   ID:  Anne [name], Anne [name] 1941-08-06, MRN [id number]  PCP:  Penni Homans, MD  Cardiologist:   Dorris Carnes, MD   No chief complaint on file.     History of Present Illness: Anne [name] is a 74 y.o. female with a history of orthostatic intolerance, carotid stenosis, HL, COPD, anemia, GERD, eosinophilic esophagitis, breast CA. Echo 3/14: Mild LVH, EF 37-16%, grade 1 diastolic dysfunction. Event monitor 01/2013: NSR, extensive PVCs. Carotid US 9/67: RICA 8-93%, LICA 81-01% (f/u 1 year). Myoview 4/14: Low risk, no scar or ischemia, not gated. She has been treated with low dose metoprolol for PVCs and palps.   Patient is a difficult historian  She says that she has spell of dizziness this summer  BUt she cant recall details   Stairs gets SOB  Chronic  She is not very active         Current Outpatient Prescriptions  Medication Sig Dispense Refill  . amitriptyline (ELAVIL) 150 MG tablet Take 1 tablet (150 mg total) by mouth at bedtime. 30 tablet 2  . aspirin 81 MG tablet Take 81 mg by mouth daily.      [name] atorvastatin (LIPITOR) 40 MG tablet TAKE 1 TABLET EVERY DAY 90 tablet 3  . Cholecalciferol (VITAMIN D3) 1000 UNITS CAPS Take 2 tablets by mouth daily.      . hyoscyamine (LEVSIN SL) 0.125 MG SL tablet Place 1 tablet (0.125 mg total) under the tongue every 4 (four) hours as needed. 30 tablet 2  . loratadine (CLARITIN) 10 MG tablet Take 10 mg by mouth daily.    [name] LORazepam (ATIVAN) 1 MG tablet take 1 tablet by mouth every 8 hours if needed for anxiety 70 tablet 2  . pantoprazole (PROTONIX) 40 MG tablet Take 1 tablet (40 mg total) by mouth 2 (two) times daily. 180 tablet 3  . Probiotic Product (PROBIOTIC FORMULA PO) Take 1 capsule by mouth daily.    . vitamin B-12 (CYANOCOBALAMIN) 1000 MCG tablet Take 1,000 mcg by mouth daily.       No current facility-administered medications for this visit.    Allergies:   Codeine; Diphenhydramine  hcl; Morphine; Peanut-containing drug products; Sorbitol; Tomato; and Zofran   Past Medical History  Diagnosis Date  . Anemia   . COPD (chronic obstructive pulmonary disease) ([place])   . Hyperlipidemia   . Pneumonia 2-10    pleurisy  . Arthritis   . Depression   . GERD (gastroesophageal reflux disease) 09/29/2009    improved s/p cholecystectomy and esophagus dilatation  . History of chicken pox   . History of shingles     2 episodes  . History of measles   . Osteopenia 03/14/2011  . 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 Pure hypercholesterolemia 10/17/2010  . 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 ESOPHAGEAL STRICTURE 03/29/2009  . CAROTID ARTERY STENOSIS 01/13/2010  . ALKALINE PHOSPHATASE, ELEVATED 03/15/2009  . Anxiety and depression 04/28/2011  . Vaginitis 05/22/2011  . Baker's cyst of knee 05/22/2011  . Trauma 10/18/2011  . Radial neck fracture 10/2011    minimally displaced  . Atypical chest pain 11/30/2011  . Folliculitis of nose 7/51/0258  . Esophageal ring   . 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 Urinary incontinence 03/19/2012  . Allergic state 06/10/2012  . Hematuria   . Dysphagia   . URI (upper  respiratory infection) 09/02/2012  . Preventative health care 10/21/2012  . Dermatitis 11/23/2012  . Fall 11/23/2012  . Hx of echocardiogram     a. Echo 01/2103: mild LVH, EF 55-60%, normal wall motion, Gr 1 diast dysfn  . Knee pain, bilateral 07/23/2011  . Elevated sed rate 08/02/2013  . EE (eosinophilic esophagitis)   . Urinary frequency 12/12/2014  . Chronic alcoholism in remission ([place]) 03/29/2011    Did not tolerate Klonopin, caused some confusion and bad dreams.    . Mixed hyperlipidemia 10/17/2010    Qualifier: Diagnosis of  By: Mack Guise    . Medicare annual wellness visit, subsequent 06/19/2015    Past Surgical History  Procedure Laterality Date  . Anterior cruciate ligament repair  08, 09, 10  . Ercp  2010     CBD  stone extraction   . Cholecystectomy  2010  . Esophagogastroduodenoscopy (egd) with esophageal dilation  2010, 2012  . Breast lumpectomy  2001  . Colonoscopy  09/05/10    cecal avm's  . Esophagogastroduodenoscopy  01/08/2012    Procedure: ESOPHAGOGASTRODUODENOSCOPY (EGD);  Surgeon: Gatha Mayer, MD;  Location: [facility];  Service: Endoscopy;  Laterality: N/A;  . Savory dilation  01/08/2012    Procedure: SAVORY DILATION;  Surgeon: Gatha Mayer, MD;  Location: [facility];  Service: Endoscopy;  Laterality: N/A;  need xray  . Mastectomy modified radical      , Mastectomy modified radical (08), breast reconstruction, CA lesions excised lateral abd wall 2010  . Breast reconstruction  2008, 2009, 2010     Social History:  The patient  reports that she quit smoking about 8 years ago. Her smoking use included Cigarettes. She has never used smokeless tobacco. She reports that she drinks about 0.6 oz of alcohol per week. She reports that she does not use illicit drugs.   Family History:  The patient's family history includes Alcohol abuse in her maternal grandfather; Anxiety disorder in her sister; Arthritis in her sister; Breast cancer in her maternal aunt and paternal aunt; Cirrhosis in her sister; Heart disease in her father and paternal grandfather; Lung cancer in her father; Osteoporosis in her sister and sister; Other in her mother; Skin cancer in her sister; Stroke in her maternal grandmother. There is no history of Anesthesia problems, Hypotension, Malignant hyperthermia, Pseudochol deficiency, Colon cancer, Ovarian cancer, or Pancreatic cancer.    ROS:  Please see the history of present illness. All other systems are reviewed and  Negative to the above problem except as noted.    PHYSICAL EXAM: VS:  BP 100/66 mmHg  Pulse 86  Ht 5\' 6"  (1.676 m)  Wt 128 lb 12.8 oz (58.423 kg)  BMI 20.80 kg/m2  GEN: Well nourished, well developed, in no acute distress HEENT: normal Neck: no  JVD Cardiac: RRR; no murmurs, rubs, or gallops,no edema  Respiratory:  clear to auscultation bilaterally, normal work of breathing GI: soft, nontender, nondistended, + BS  No hepatomegaly  MS: no deformity Moving all extremities   Skin: warm and dry, no rash Neuro:  Strength and sensation are intact Psych: euthymic mood, full affect   EKG:  EKG is ordered today.  SR 83 bpm  Incomp RBBB  Nonspecific ST changes     Lipid Panel    Component Value Date/Time   CHOL 153 06/10/2015 1216   TRIG 100.0 06/10/2015 1216   HDL 61.60 06/10/2015 1216   CHOLHDL 2 06/10/2015 1216   VLDL 20.0 06/10/2015 1216   LDLCALC  72 06/10/2015 1216   LDLDIRECT 117.4 01/13/2013 1210      Wt Readings from Last 3 Encounters:  08/15/15 128 lb 12.8 oz (58.423 kg)  07/06/15 127 lb 4 oz (57.72 kg)  06/10/15 127 lb 12.8 oz (57.97 kg)      ASSESSMENT AND PLAN:  1. Orthostatic intolerance  Hyrdrate  Increase activity  2.  CV dz  F/U carotid USN in May.  3.  HL  Keep on lipitor.  4.  Palpitations  Denies     Current medicines are reviewed at length with the patient t  Signed, Dorris Carnes, MD  08/15/2015 2:57 PM    [address], [place], [place]  [address] Phone: [phone]; Fax: [phone]

## 2015-08-27 ENCOUNTER — Other Ambulatory Visit: Payer: Self-pay | Admitting: Family Medicine

## 2015-10-05 ENCOUNTER — Other Ambulatory Visit: Payer: Self-pay | Admitting: Family Medicine

## 2015-10-05 NOTE — Telephone Encounter (Signed)
OK to refill the Lorazepam, #70 with 1 rf

## 2015-10-06 ENCOUNTER — Encounter: Payer: Self-pay | Admitting: Family Medicine

## 2015-10-10 ENCOUNTER — Ambulatory Visit: Payer: Commercial Managed Care - HMO | Admitting: Physician Assistant

## 2015-10-10 NOTE — Telephone Encounter (Signed)
Faxed hardcopy for Lorazepam to [facility] [place] 

## 2015-10-10 NOTE — Telephone Encounter (Signed)
Printed and on counter for signature. 

## 2015-10-14 ENCOUNTER — Telehealth: Payer: Self-pay | Admitting: Oncology

## 2015-10-14 NOTE — Telephone Encounter (Signed)
Lvm advising next appt 05/10/16 @ 130pm. Also mailed appt calendar.

## 2015-10-25 ENCOUNTER — Telehealth: Payer: Self-pay | Admitting: *Deleted

## 2015-10-25 NOTE — Telephone Encounter (Signed)
Patient left a voicemail on the refill line requesting a refill on a medication for palpitations that was given to her previously by a PA. She had not been having palpitations but recently she has experienced some. Please advise. Thanks, MI

## 2015-10-25 NOTE — Telephone Encounter (Signed)
Spoke with patient to find out what medication she is requesting for refill.  She saw Dr. Harrington Challenger in October and reported no problems with palpitations.  She states her palpitations have returned and she is using anti-anxiety medication for symptom relief.  She would like to get another Rx for the medication given to her by Richardson Dopp, PA more than a year ago.  I reviewed chart and Scott prescribed short acting Diltiazem 30 mg.  I advised her that I would send a message to Dr. Harrington Challenger for advice.  She verbalized understanding and agreement.

## 2015-10-26 MED ORDER — DILTIAZEM HCL 30 MG PO TABS
30.0000 mg | ORAL_TABLET | Freq: Every day | ORAL | Status: DC | PRN
Start: 2015-10-26 — End: 2016-08-05

## 2015-10-26 NOTE — Telephone Encounter (Signed)
Left message to call back  

## 2015-10-26 NOTE — Telephone Encounter (Signed)
OK to fll Rx for meds including diltiazem 30

## 2015-10-26 NOTE — Telephone Encounter (Signed)
Spoke with pt and informed her that Dr. Harrington Challenger said ok to send in prescription for Dilt. Verified pharmacy and informed pt that I will send it in now. Pt verbalized understanding and was appreciative for call back.

## 2015-10-26 NOTE — Telephone Encounter (Signed)
Left message on refill VM returning call. I will forward message to Maude Leriche.

## 2015-10-26 NOTE — Telephone Encounter (Signed)
Follow Up ° °Pt returned call//  °

## 2015-11-01 ENCOUNTER — Ambulatory Visit (INDEPENDENT_AMBULATORY_CARE_PROVIDER_SITE_OTHER): Payer: Commercial Managed Care - HMO | Admitting: Family Medicine

## 2015-11-01 ENCOUNTER — Encounter: Payer: Self-pay | Admitting: Family Medicine

## 2015-11-01 VITALS — BP 108/70 | HR 82 | Temp 98.1°F | Ht 66.0 in | Wt 133.2 lb

## 2015-11-01 DIAGNOSIS — R748 Abnormal levels of other serum enzymes: Secondary | ICD-10-CM

## 2015-11-01 DIAGNOSIS — E785 Hyperlipidemia, unspecified: Secondary | ICD-10-CM | POA: Diagnosis not present

## 2015-11-01 DIAGNOSIS — L309 Dermatitis, unspecified: Secondary | ICD-10-CM

## 2015-11-01 DIAGNOSIS — R000 Tachycardia, unspecified: Secondary | ICD-10-CM

## 2015-11-01 DIAGNOSIS — L989 Disorder of the skin and subcutaneous tissue, unspecified: Secondary | ICD-10-CM | POA: Diagnosis not present

## 2015-11-01 DIAGNOSIS — Z23 Encounter for immunization: Secondary | ICD-10-CM | POA: Diagnosis not present

## 2015-11-01 DIAGNOSIS — R079 Chest pain, unspecified: Secondary | ICD-10-CM

## 2015-11-01 DIAGNOSIS — L578 Other skin changes due to chronic exposure to nonionizing radiation: Secondary | ICD-10-CM

## 2015-11-01 DIAGNOSIS — R0789 Other chest pain: Secondary | ICD-10-CM

## 2015-11-01 MED ORDER — ZOSTER VACCINE LIVE 19400 UNT/0.65ML ~~LOC~~ SOLR: 0.6500 mL | Freq: Once | SUBCUTANEOUS | Status: DC

## 2015-11-01 MED ORDER — AMITRIPTYLINE HCL 150 MG PO TABS: 150.0000 mg | ORAL_TABLET | Freq: Every day | ORAL | Status: DC

## 2015-11-01 MED ORDER — PNEUMOCOCCAL 13-VAL CONJ VACC IM SUSP: 0.5000 mL | INTRAMUSCULAR | Status: DC

## 2015-11-01 NOTE — Assessment & Plan Note (Signed)
Improved on recheck.  

## 2015-11-01 NOTE — Progress Notes (Signed)
Pre visit review using our clinic review tool, if applicable. No additional management support is needed unless otherwise documented below in the visit note. 

## 2015-11-01 NOTE — Assessment & Plan Note (Signed)
Occurred after heavy lifting. Was present for a couple of weeks but is now improved is encouraged to try topical treatments such as [employer]

## 2015-11-01 NOTE — Progress Notes (Signed)
Subjective:    Patient ID: Anne [name], female    DOB: 12-06-1940, 74 y.o.   MRN: [id number]  Chief Complaint  Patient presents with  . Nevus  . Plantar Warts    HPI Patient is in today for evaluation of left anterior lower chest pain. She noted it occurred after she lifted something heavy. It has now resolved. No associated symptoms. She has some irritated lesions on back and chest. No recent illness. No acute concerns. Denies palp/SOB/HA/congestion/fevers/GI or GU c/o. Taking meds as prescribed  Past Medical History  Diagnosis Date  . Anemia   . COPD (chronic obstructive pulmonary disease) ([place])   . Hyperlipidemia   . Pneumonia 2-10    pleurisy  . Arthritis   . Depression   . GERD (gastroesophageal reflux disease) 09/29/2009    improved s/p cholecystectomy and esophagus dilatation  . History of chicken pox   . History of shingles     2 episodes  . History of measles   . Osteopenia 03/14/2011  . 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 Pure hypercholesterolemia 10/17/2010  . 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 ESOPHAGEAL STRICTURE 03/29/2009  . CAROTID ARTERY STENOSIS 01/13/2010  . ALKALINE PHOSPHATASE, ELEVATED 03/15/2009  . Anxiety and depression 04/28/2011  . Vaginitis 05/22/2011  . Baker's cyst of knee 05/22/2011  . Trauma 10/18/2011  . Radial neck fracture 10/2011    minimally displaced  . Atypical chest pain 11/30/2011  . Folliculitis of nose 2/63/3354  . Esophageal ring   . 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 Urinary incontinence 03/19/2012  . Allergic state 06/10/2012  . Hematuria   . Dysphagia   . URI (upper respiratory infection) 09/02/2012  . Preventative health care 10/21/2012  . Dermatitis 11/23/2012  . Fall 11/23/2012  . Hx of echocardiogram     a. Echo 01/2103: mild LVH, EF 55-60%, normal wall motion, Gr 1 diast dysfn  . Knee pain, bilateral 07/23/2011  . Elevated sed rate 08/02/2013  . EE (eosinophilic  esophagitis)   . Urinary frequency 12/12/2014  . 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    . Mixed hyperlipidemia 10/17/2010    Qualifier: Diagnosis of  By: Mack Guise    . Medicare annual wellness visit, subsequent 06/19/2015    Past Surgical History  Procedure Laterality Date  . Anterior cruciate ligament repair  08, 09, 10  . Ercp  2010     CBD stone extraction   . Cholecystectomy  2010  . Esophagogastroduodenoscopy (egd) with esophageal dilation  2010, 2012  . Breast lumpectomy  2001  . Colonoscopy  09/05/10    cecal avm's  . Esophagogastroduodenoscopy  01/08/2012    Procedure: ESOPHAGOGASTRODUODENOSCOPY (EGD);  Surgeon: Gatha Mayer, MD;  Location: [facility];  Service: Endoscopy;  Laterality: N/A;  . Savory dilation  01/08/2012    Procedure: SAVORY DILATION;  Surgeon: Gatha Mayer, MD;  Location: [facility];  Service: Endoscopy;  Laterality: N/A;  need xray  . Mastectomy modified radical      , Mastectomy modified radical (08), breast reconstruction, CA lesions excised lateral abd wall 2010  . Breast reconstruction  2008, 2009, 2010    Family History  Problem Relation Age of Onset  . Heart disease Father   . Lung cancer Father     smoker  . Cirrhosis Sister  Primary Biliary  . Breast cancer Maternal Aunt     x 3  . Stroke Maternal Grandmother   . Alcohol abuse Maternal Grandfather   . Heart disease Paternal Grandfather   . Anxiety disorder Sister   . Osteoporosis Sister   . Arthritis Sister     Rheumatoid  . Osteoporosis Sister   . Skin cancer Sister     multiple skin cancers, over 24 excisions.  . Other Mother     tic [place]  . Anesthesia problems Neg Hx   . Hypotension Neg Hx   . Malignant hyperthermia Neg Hx   . Pseudochol deficiency Neg Hx   . Colon cancer Neg Hx   . Ovarian cancer Neg Hx   . Pancreatic cancer Neg Hx   . Breast cancer Paternal Aunt      Social History   Social History  . Marital Status: Married    Spouse Name: N/A  . Number of Children: N/A  . Years of Education: N/A   Occupational History  . Not on file.   Social History Main Topics  . Smoking status: Former Smoker    Types: Cigarettes    Quit date: 05/22/2007  . Smokeless tobacco: Never Used  . Alcohol Use: 0.6 oz/week    1 Glasses of wine per week     Comment: at least 6 oz daily  . Drug Use: No  . Sexual Activity:    Partners: Male     Comment: lives with husband,    Other Topics Concern  . Not on file   Social History Narrative    Outpatient Prescriptions Prior to Visit  Medication Sig Dispense Refill  . aspirin 81 MG tablet Take 81 mg by mouth daily.      [name] atorvastatin (LIPITOR) 40 MG tablet TAKE 1 TABLET EVERY DAY 90 tablet 3  . Cholecalciferol (VITAMIN D3) 1000 UNITS CAPS Take 2 tablets by mouth daily.      [name] diltiazem (CARDIZEM) 30 MG tablet Take 1 tablet (30 mg total) by mouth daily as needed (for palpitations). 30 tablet 4  . hyoscyamine (LEVSIN SL) 0.125 MG SL tablet Place 1 tablet (0.125 mg total) under the tongue every 4 (four) hours as needed. 30 tablet 2  . loratadine (CLARITIN) 10 MG tablet Take 10 mg by mouth daily.    [name] LORazepam (ATIVAN) 1 MG tablet TAKE 1 TABLET BY MOUTH EVERY 8 HOURS IF NEEDED FOR ANXIETY 70 tablet 1  . Probiotic Product (PROBIOTIC FORMULA PO) Take 1 capsule by mouth daily.    . vitamin B-12 (CYANOCOBALAMIN) 1000 MCG tablet Take 1,000 mcg by mouth daily.      [name] amitriptyline (ELAVIL) 150 MG tablet take 1 tablet by mouth at bedtime 30 tablet 2  . pantoprazole (PROTONIX) 40 MG tablet Take 1 tablet (40 mg total) by mouth 2 (two) times daily. 180 tablet 3   No facility-administered medications prior to visit.    Allergies  Allergen Reactions  . Codeine Other (See Comments)    "flu like symptoms"  . Diphenhydramine Hcl Other (See Comments)    "nervous and upset"  . Morphine Other (See Comments)    "flu  like symptoms"  . Peanut-Containing Drug Products Hives  . Sorbitol Other (See Comments)    GI Issues  . Tomato Diarrhea  . Zofran Other (See Comments)    HEADACHE    Review of Systems  Constitutional: Negative for fever and malaise/fatigue.  HENT: Negative for congestion.   Eyes: Negative  for discharge.  Respiratory: Negative for shortness of breath.   Cardiovascular: Positive for chest pain. Negative for palpitations and leg swelling.  Gastrointestinal: Negative for nausea and abdominal pain.  Genitourinary: Negative for dysuria.  Musculoskeletal: Negative for falls.  Skin: Positive for rash.  Neurological: Negative for loss of consciousness and headaches.  Endo/Heme/Allergies: Negative for environmental allergies.  Psychiatric/Behavioral: Negative for depression. The patient is not nervous/anxious.        Objective:    Physical Exam  Constitutional: She is oriented to person, place, and time. She appears well-developed and well-nourished. No distress.  HENT:  Head: Normocephalic and atraumatic.  Nose: Nose normal.  Eyes: Right eye exhibits no discharge. Left eye exhibits no discharge.  Neck: Normal range of motion. Neck supple.  Cardiovascular: Normal rate and regular rhythm.   No murmur heard. Pulmonary/Chest: Effort normal and breath sounds normal.  Abdominal: Soft. Bowel sounds are normal. There is no tenderness.  Musculoskeletal: She exhibits no edema.  Neurological: She is alert and oriented to person, place, and time.  Skin: Skin is warm and dry.  Scattered seborrhea lesions on trunk  Psychiatric: She has a normal mood and affect.  Nursing note and vitals reviewed.   BP 108/70 mmHg  Pulse 82  Temp(Src) 98.1 F (36.7 C) (Oral)  Ht 5' 6"  (1.676 m)  Wt 133 lb 3.2 oz (60.419 kg)  BMI 21.51 kg/m2  SpO2 96% Wt Readings from Last 3 Encounters:  11/01/15 133 lb 3.2 oz (60.419 kg)  08/15/15 128 lb 12.8 oz (58.423 kg)  07/06/15 127 lb 4 oz (57.72 kg)      Lab Results  Component Value Date   WBC 5.5 06/10/2015   HGB 12.7 06/10/2015   HCT 38.4 06/10/2015   PLT 274.0 06/10/2015   GLUCOSE 76 06/10/2015   CHOL 153 06/10/2015   TRIG 100.0 06/10/2015   HDL 61.60 06/10/2015   LDLDIRECT 117.4 01/13/2013   LDLCALC 72 06/10/2015   ALT 47* 06/10/2015   AST 34 06/10/2015   NA 140 06/10/2015   K 3.8 06/10/2015   CL 101 06/10/2015   CREATININE 0.73 06/10/2015   BUN 15 06/10/2015   CO2 31 06/10/2015   TSH 3.44 06/10/2015   INR 1.0 03/29/2011    Lab Results  Component Value Date   TSH 3.44 06/10/2015   Lab Results  Component Value Date   WBC 5.5 06/10/2015   HGB 12.7 06/10/2015   HCT 38.4 06/10/2015   MCV 91.5 06/10/2015   PLT 274.0 06/10/2015   Lab Results  Component Value Date   NA 140 06/10/2015   K 3.8 06/10/2015   CHLORIDE 105 05/09/2015   CO2 31 06/10/2015   GLUCOSE 76 06/10/2015   BUN 15 06/10/2015   CREATININE 0.73 06/10/2015   BILITOT 0.3 06/10/2015   ALKPHOS 130* 06/10/2015   AST 34 06/10/2015   ALT 47* 06/10/2015   PROT 7.5 06/10/2015   ALBUMIN 4.1 06/10/2015   CALCIUM 9.5 06/10/2015   ANIONGAP 8 05/09/2015   EGFR 71* 05/09/2015   GFR 83.12 06/10/2015   Lab Results  Component Value Date   CHOL 153 06/10/2015   Lab Results  Component Value Date   HDL 61.60 06/10/2015   Lab Results  Component Value Date   LDLCALC 72 06/10/2015   Lab Results  Component Value Date   TRIG 100.0 06/10/2015   Lab Results  Component Value Date   CHOLHDL 2 06/10/2015   No results found for: HGBA1C  Assessment & Plan:   Problem List Items Addressed This Visit    Chest wall pain    Occurred after heavy lifting. Was present for a couple of weeks but is now improved is encouraged to try topical treatments such as Salon Pas      Dermatitis    Several irritated skin lesions she would like to have evaluated she is referred to dermatology for further consideration      Tachycardia    Improved on recheck        Other Visit Diagnoses    Encounter for immunization    -  Primary    Sun-damaged skin        Relevant Medications    pneumococcal 13-valent conjugate vaccine (PREVNAR 13) SUSP injection    Other Relevant Orders    Ambulatory referral to Dermatology    Skin lesion        Relevant Medications    pneumococcal 13-valent conjugate vaccine (PREVNAR 13) SUSP injection    Other Relevant Orders    Ambulatory referral to Dermatology    Need for viral immunization        Relevant Medications    zoster vaccine live, PF, (ZOSTAVAX) 40768 UNT/0.65ML injection    Need for pneumococcal vaccine        Relevant Medications    pneumococcal 13-valent conjugate vaccine (PREVNAR 13) SUSP injection    Hyperlipidemia        Relevant Orders    CBC with Differential/Platelet    Comp Met (CMET)    TSH    Lipid panel    Chest pain, unspecified chest pain type        Relevant Orders    CBC with Differential/Platelet    Comp Met (CMET)    TSH    Lipid panel    Alkaline phosphatase elevation        Relevant Orders    CBC with Differential/Platelet    Comp Met (CMET)    TSH    Lipid panel       I have discontinued Ms. Deschepper's pantoprazole. I have also changed her amitriptyline. Additionally, I am having her start on zoster vaccine live (PF) and pneumococcal 13-valent conjugate vaccine. Lastly, I am having her maintain her vitamin B-12, Vitamin D3, aspirin, Probiotic Product (PROBIOTIC FORMULA PO), loratadine, hyoscyamine, atorvastatin, LORazepam, and diltiazem.  Meds ordered this encounter  Medications  . amitriptyline (ELAVIL) 150 MG tablet    Sig: Take 1 tablet (150 mg total) by mouth at bedtime. [place] brand please    Dispense:  90 tablet    Refill:  1    DISCONTINUE ALL PREVIOUS REFILLS FOR THIS MEDICATION  . zoster vaccine live, PF, (ZOSTAVAX) 08811 UNT/0.65ML injection    Sig: Inject 19,400 Units into the skin once.    Dispense:  1 each    Refill:  0  . pneumococcal 13-valent  conjugate vaccine (PREVNAR 13) SUSP injection    Sig: Inject 0.5 mLs into the muscle tomorrow at 10 am.    Dispense:  0.5 mL    Refill:  0     Penni Homans, MD

## 2015-11-01 NOTE — Patient Instructions (Addendum)
Salon pas and/or Aspercreme patches or gels  Aspirin to wart and folic acid one mg per mouth  Insomnia Insomnia is a sleep disorder that makes it difficult to fall asleep or to stay asleep. Insomnia can cause tiredness (fatigue), low energy, difficulty concentrating, mood swings, and poor performance at work or school.  There are three different ways to classify insomnia:  Difficulty falling asleep.  Difficulty staying asleep.  Waking up too early in the morning. Any type of insomnia can be long-term (chronic) or short-term (acute). Both are common. Short-term insomnia usually lasts for three months or less. Chronic insomnia occurs at least three times a week for longer than three months. CAUSES  Insomnia may be caused by another condition, situation, or substance, such as:  Anxiety.  Certain medicines.  Gastroesophageal reflux disease (GERD) or other gastrointestinal conditions.  Asthma or other breathing conditions.  Restless legs syndrome, sleep apnea, or other sleep disorders.  Chronic pain.  Menopause. This may include hot flashes.  Stroke.  Abuse of alcohol, tobacco, or illegal drugs.  Depression.  Caffeine.   Neurological disorders, such as Alzheimer disease.  An overactive thyroid (hyperthyroidism). The cause of insomnia may not be known. RISK FACTORS Risk factors for insomnia include:  Gender. Women are more commonly affected than men.  Age. Insomnia is more common as you get older.  Stress. This may involve your professional or personal life.  Income. Insomnia is more common in people with lower income.  Lack of exercise.   Irregular work schedule or night shifts.  Traveling between different time zones. SIGNS AND SYMPTOMS If you have insomnia, trouble falling asleep or trouble staying asleep is the main symptom. This may lead to other symptoms, such as:  Feeling fatigued.  Feeling nervous about going to sleep.  Not feeling rested in the  morning.  Having trouble concentrating.  Feeling irritable, anxious, or depressed. TREATMENT  Treatment for insomnia depends on the cause. If your insomnia is caused by an underlying condition, treatment will focus on addressing the condition. Treatment may also include:   Medicines to help you sleep.  Counseling or therapy.  Lifestyle adjustments. HOME CARE INSTRUCTIONS   Take medicines only as directed by your health care provider.  Keep regular sleeping and waking hours. Avoid naps.  Keep a sleep diary to help you and your health care provider figure out what could be causing your insomnia. Include:   When you sleep.  When you wake up during the night.  How well you sleep.   How rested you feel the next day.  Any side effects of medicines you are taking.  What you eat and drink.   Make your bedroom a comfortable place where it is easy to fall asleep:  Put up shades or special blackout curtains to block light from outside.  Use a white noise machine to block noise.  Keep the temperature cool.   Exercise regularly as directed by your health care provider. Avoid exercising right before bedtime.  Use relaxation techniques to manage stress. Ask your health care provider to suggest some techniques that may work well for you. These may include:  Breathing exercises.  Routines to release muscle tension.  Visualizing peaceful scenes.  Cut back on alcohol, caffeinated beverages, and cigarettes, especially close to bedtime. These can disrupt your sleep.  Do not overeat or eat spicy foods right before bedtime. This can lead to digestive discomfort that can make it hard for you to sleep.  Limit screen use before  bedtime. This includes:  Watching TV.  Using your smartphone, tablet, and computer.  Stick to a routine. This can help you fall asleep faster. Try to do a quiet activity, brush your teeth, and go to bed at the same time each night.  Get out of bed if  you are still awake after 15 minutes of trying to sleep. Keep the lights down, but try reading or doing a quiet activity. When you feel sleepy, go back to bed.  Make sure that you drive carefully. Avoid driving if you feel very sleepy.  Keep all follow-up appointments as directed by your health care provider. This is important. SEEK MEDICAL CARE IF:   You are tired throughout the day or have trouble in your daily routine due to sleepiness.  You continue to have sleep problems or your sleep problems get worse. SEEK IMMEDIATE MEDICAL CARE IF:   You have serious thoughts about hurting yourself or someone else.   This information is not intended to replace advice given to you by your health care provider. Make sure you discuss any questions you have with your health care provider.   Document Released: 10/26/2000 Document Revised: 07/20/2015 Document Reviewed: 07/30/2014 Elsevier Interactive Patient Education [employer].

## 2015-11-01 NOTE — Assessment & Plan Note (Signed)
Several irritated skin lesions she would like to have evaluated she is referred to dermatology for further consideration

## 2015-11-02 ENCOUNTER — Encounter: Payer: Self-pay | Admitting: Family Medicine

## 2015-11-21 ENCOUNTER — Encounter: Payer: Self-pay | Admitting: Family Medicine

## 2015-11-21 ENCOUNTER — Ambulatory Visit (INDEPENDENT_AMBULATORY_CARE_PROVIDER_SITE_OTHER): Payer: Commercial Managed Care - HMO | Admitting: Family Medicine

## 2015-11-21 VITALS — BP 102/72 | HR 102 | Temp 98.4°F | Ht 66.0 in | Wt 132.0 lb

## 2015-11-21 DIAGNOSIS — R000 Tachycardia, unspecified: Secondary | ICD-10-CM | POA: Diagnosis not present

## 2015-11-21 DIAGNOSIS — J019 Acute sinusitis, unspecified: Secondary | ICD-10-CM

## 2015-11-21 MED ORDER — CEFDINIR 300 MG PO CAPS: 300.0000 mg | ORAL_CAPSULE | Freq: Two times a day (BID) | ORAL | Status: AC

## 2015-11-21 NOTE — Progress Notes (Signed)
Pre visit review using our clinic review tool, if applicable. No additional management support is needed unless otherwise documented below in the visit note. 

## 2015-11-21 NOTE — Patient Instructions (Addendum)
Probiotic,Elderberry, Zinc supplement. Encouraged increased rest and hydration, add probiotics, zinc such as Coldeze or Xicam. Treat fevers as needed Probiotic by [place] 10 strain can order at [email or web address]  Sinusitis, Adult Sinusitis is redness, soreness, and inflammation of the paranasal sinuses. Paranasal sinuses are air pockets within the bones of your face. They are located beneath your eyes, in the middle of your forehead, and above your eyes. In healthy paranasal sinuses, mucus is able to drain out, and air is able to circulate through them by way of your nose. However, when your paranasal sinuses are inflamed, mucus and air can become trapped. This can allow bacteria and other germs to grow and cause infection. Sinusitis can develop quickly and last only a short time (acute) or continue over a long period (chronic). Sinusitis that lasts for more than 12 weeks is considered chronic. CAUSES Causes of sinusitis include:  Allergies.  Structural abnormalities, such as displacement of the cartilage that separates your nostrils (deviated septum), which can decrease the air flow through your nose and sinuses and affect sinus drainage.  Functional abnormalities, such as when the small hairs (cilia) that line your sinuses and help remove mucus do not work properly or are not present. SIGNS AND SYMPTOMS Symptoms of acute and chronic sinusitis are the same. The primary symptoms are pain and pressure around the affected sinuses. Other symptoms include:  Upper toothache.  Earache.  Headache.  Bad breath.  Decreased sense of smell and taste.  A cough, which worsens when you are lying flat.  Fatigue.  Fever.  Thick drainage from your nose, which often is green and may contain pus (purulent).  Swelling and warmth over the affected sinuses. DIAGNOSIS Your health care provider will perform a physical exam. During your exam, your health care provider may perform any of the following  to help determine if you have acute sinusitis or chronic sinusitis:  Look in your nose for signs of abnormal growths in your nostrils (nasal polyps).  Tap over the affected sinus to check for signs of infection.  View the inside of your sinuses using an imaging device that has a light attached (endoscope). If your health care provider suspects that you have chronic sinusitis, one or more of the following tests may be recommended:  Allergy tests.  Nasal culture. A sample of mucus is taken from your nose, sent to a lab, and screened for bacteria.  Nasal cytology. A sample of mucus is taken from your nose and examined by your health care provider to determine if your sinusitis is related to an allergy. TREATMENT Most cases of acute sinusitis are related to a viral infection and will resolve on their own within 10 days. Sometimes, medicines are prescribed to help relieve symptoms of both acute and chronic sinusitis. These may include pain medicines, decongestants, nasal steroid sprays, or saline sprays. However, for sinusitis related to a bacterial infection, your health care provider will prescribe antibiotic medicines. These are medicines that will help kill the bacteria causing the infection. Rarely, sinusitis is caused by a fungal infection. In these cases, your health care provider will prescribe antifungal medicine. For some cases of chronic sinusitis, surgery is needed. Generally, these are cases in which sinusitis recurs more than 3 times per year, despite other treatments. HOME CARE INSTRUCTIONS  Drink plenty of water. Water helps thin the mucus so your sinuses can drain more easily.  Use a humidifier.  Inhale steam 3-4 times a day (for example, sit in the bathroom with  the shower running).  Apply a warm, moist washcloth to your face 3-4 times a day, or as directed by your health care provider.  Use saline nasal sprays to help moisten and clean your sinuses.  Take medicines only  as directed by your health care provider.  If you were prescribed either an antibiotic or antifungal medicine, finish it all even if you start to feel better. SEEK IMMEDIATE MEDICAL CARE IF:  You have increasing pain or severe headaches.  You have nausea, vomiting, or drowsiness.  You have swelling around your face.  You have vision problems.  You have a stiff neck.  You have difficulty breathing.   This information is not intended to replace advice given to you by your health care provider. Make sure you discuss any questions you have with your health care provider.   Document Released: 10/29/2005 Document Revised: 11/19/2014 Document Reviewed: 11/13/2011 Elsevier Interactive Patient Education [employer].

## 2015-11-22 ENCOUNTER — Encounter: Payer: Self-pay | Admitting: Physician Assistant

## 2015-11-22 ENCOUNTER — Encounter: Payer: Commercial Managed Care - HMO | Admitting: Physician Assistant

## 2015-11-22 NOTE — Progress Notes (Signed)
This encounter was created in error - please disregard.

## 2015-11-22 NOTE — Progress Notes (Deleted)
Cancelled.  

## 2015-12-04 ENCOUNTER — Encounter: Payer: Self-pay | Admitting: Family Medicine

## 2015-12-04 DIAGNOSIS — J019 Acute sinusitis, unspecified: Secondary | ICD-10-CM | POA: Insufficient documentation

## 2015-12-04 NOTE — Assessment & Plan Note (Signed)
Started on Cefdinir and Encouraged increased rest and hydration, add probiotics, zinc such as Coldeze or Xicam. Treat fevers as needed 

## 2015-12-04 NOTE — Progress Notes (Signed)
Subjective:    Patient ID: Anne [name], female    DOB: Sep 15, 1941, 75 y.o.   MRN: [id number]  Chief Complaint  Patient presents with  . Follow-up    congestion  . Cough    HPI Patient is in today for evaluation of worsening respiratory symptoms. She has been struggling with several days of fatigue, malaise, myalgias. She has had congestion and nasal congestion productive of yellow phlegm. She's been having increased sneezing, coughing. She also endorses some anorexia but denies nausea vomiting or diarrhea. Denies ear or throat symptoms. Denies CP/palp/SOB/fevers/GI or GU c/o. Taking meds as prescribed  Past Medical History  Diagnosis Date  . Anemia   . COPD (chronic obstructive pulmonary disease) ([place])   . Hyperlipidemia   . Pneumonia 2-10    pleurisy  . Arthritis   . Depression   . GERD (gastroesophageal reflux disease) 09/29/2009    improved s/p cholecystectomy and esophagus dilatation  . History of chicken pox   . History of shingles     2 episodes  . History of measles   . Osteopenia 03/14/2011  . 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 ESOPHAGEAL STRICTURE 03/29/2009  . ALKALINE PHOSPHATASE, ELEVATED 03/15/2009  . Anxiety and depression 04/28/2011  . Vaginitis 05/22/2011  . Baker's cyst of knee 05/22/2011  . Trauma 10/18/2011  . Radial neck fracture 10/2011    minimally displaced  . Atypical chest pain 11/30/2011  . Folliculitis of nose 5/64/3329  . Esophageal ring   . 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 Urinary incontinence 03/19/2012  . Allergic state 06/10/2012  . Hematuria   . Dysphagia   . URI (upper respiratory infection) 09/02/2012  . Preventative health care 10/21/2012  . Dermatitis 11/23/2012  . Fall 11/23/2012  . Hx of echocardiogram     a. Echo 01/2013: mild LVH, EF 55-60%, normal wall motion, Gr 1 diast dysfn  . Knee pain, bilateral 07/23/2011  . Elevated sed rate  08/02/2013  . EE (eosinophilic esophagitis)   . Urinary frequency 12/12/2014  . Chronic alcoholism in remission ([place]) 03/29/2011    Did not tolerate Klonopin, caused some confusion and bad dreams.    . Mixed hyperlipidemia 10/17/2010    Qualifier: Diagnosis of  By: Mack Guise    . Medicare annual wellness visit, subsequent 06/19/2015  . Orthostasis   . PVC's (premature ventricular contractions)     a. Event monitor 01/2013: NSR, extensive PVCs.  . Carotid artery disease ([place])     a. Carotid duplex 03/2014: stable 1-39% BICA, f/u due 03/2016.  [name] Sinusitis, acute 12/04/2015    Past Surgical History  Procedure Laterality Date  . Anterior cruciate ligament repair  08, 09, 10  . Ercp  2010     CBD stone extraction   . Cholecystectomy  2010  . Esophagogastroduodenoscopy (egd) with esophageal dilation  2010, 2012  . Breast lumpectomy  2001  . Colonoscopy  09/05/10    cecal avm's  . Esophagogastroduodenoscopy  01/08/2012    Procedure: ESOPHAGOGASTRODUODENOSCOPY (EGD);  Surgeon: Gatha Mayer, MD;  Location: [facility];  Service: Endoscopy;  Laterality: N/A;  . Savory dilation  01/08/2012    Procedure: SAVORY DILATION;  Surgeon: Gatha Mayer, MD;  Location: [facility];  Service: Endoscopy;  Laterality: N/A;  need xray  . Mastectomy modified radical      , Mastectomy modified radical (  08), breast reconstruction, CA lesions excised lateral abd wall 2010  . Breast reconstruction  2008, 2009, 2010    Family History  Problem Relation Age of Onset  . Heart disease Father   . Lung cancer Father     smoker  . Cirrhosis Sister     Primary Biliary  . Breast cancer Maternal Aunt     x 3  . Stroke Maternal Grandmother   . Alcohol abuse Maternal Grandfather   . Heart disease Paternal Grandfather   . Anxiety disorder Sister   . Osteoporosis Sister   . Arthritis Sister     Rheumatoid  . Osteoporosis Sister   . Skin cancer Sister     multiple skin cancers, over 17 excisions.  . Other  Mother     tic dou[place]  . Anesthesia problems Neg Hx   . Hypotension Neg Hx   . Malignant hyperthermia Neg Hx   . Pseudochol deficiency Neg Hx   . Colon cancer Neg Hx   . Ovarian cancer Neg Hx   . Pancreatic cancer Neg Hx   . Breast cancer Paternal Aunt     Social History   Social History  . Marital Status: Married    Spouse Name: N/A  . Number of Children: N/A  . Years of Education: N/A   Occupational History  . Not on file.   Social History Main Topics  . Smoking status: Former Smoker    Types: Cigarettes    Quit date: 05/22/2007  . Smokeless tobacco: Never Used  . Alcohol Use: 0.6 oz/week    1 Glasses of wine per week     Comment: at least 6 oz daily  . Drug Use: No  . Sexual Activity:    Partners: Male     Comment: lives with husband,    Other Topics Concern  . Not on file   Social History Narrative    Outpatient Prescriptions Prior to Visit  Medication Sig Dispense Refill  . amitriptyline (ELAVIL) 150 MG tablet Take 1 tablet (150 mg total) by mouth at bedtime. Sandos brand please 90 tablet 1  . aspirin 81 MG tablet Take 81 mg by mouth daily.      [name] atorvastatin (LIPITOR) 40 MG tablet TAKE 1 TABLET EVERY DAY 90 tablet 3  . Cholecalciferol (VITAMIN D3) 1000 UNITS CAPS Take 2 tablets by mouth daily.      [name] diltiazem (CARDIZEM) 30 MG tablet Take 1 tablet (30 mg total) by mouth daily as needed (for palpitations). 30 tablet 4  . hyoscyamine (LEVSIN SL) 0.125 MG SL tablet Place 1 tablet (0.125 mg total) under the tongue every 4 (four) hours as needed. 30 tablet 2  . loratadine (CLARITIN) 10 MG tablet Take 10 mg by mouth daily.    [name] LORazepam (ATIVAN) 1 MG tablet TAKE 1 TABLET BY MOUTH EVERY 8 HOURS IF NEEDED FOR ANXIETY 70 tablet 1  . pneumococcal 13-valent conjugate vaccine (PREVNAR 13) SUSP injection Inject 0.5 mLs into the muscle tomorrow at 10 am. 0.5 mL 0  . Probiotic Product (PROBIOTIC FORMULA PO) Take 1 capsule by mouth daily.    . vitamin B-12  (CYANOCOBALAMIN) 1000 MCG tablet Take 1,000 mcg by mouth daily.      [name] zoster vaccine live, PF, (ZOSTAVAX) 36468 UNT/0.65ML injection Inject 19,400 Units into the skin once. 1 each 0   No facility-administered medications prior to visit.    Allergies  Allergen Reactions  . Codeine Other (See Comments)    "flu  like symptoms"  . Diphenhydramine Hcl Other (See Comments)    "nervous and upset"  . Morphine Other (See Comments)    "flu like symptoms"  . Peanut-Containing Drug Products Hives  . Sorbitol Other (See Comments)    GI Issues  . Tomato Diarrhea  . Zofran Other (See Comments)    HEADACHE    Review of Systems  Constitutional: Positive for malaise/fatigue. Negative for fever and chills.  HENT: Positive for congestion.   Eyes: Negative for discharge.  Respiratory: Positive for cough and sputum production. Negative for shortness of breath.   Cardiovascular: Negative for chest pain, palpitations and leg swelling.  Gastrointestinal: Negative for nausea and abdominal pain.  Genitourinary: Negative for dysuria.  Musculoskeletal: Positive for myalgias. Negative for falls.  Skin: Negative for rash.  Neurological: Negative for loss of consciousness and headaches.  Endo/Heme/Allergies: Negative for environmental allergies.  Psychiatric/Behavioral: Negative for depression. The patient is not nervous/anxious.        Objective:    Physical Exam  Constitutional: She is oriented to person, place, and time. She appears well-developed and well-nourished. No distress.  HENT:  Head: Normocephalic and atraumatic.  Nose: Nose normal.  Nasal mucosa boggy and erythemia  Eyes: Right eye exhibits no discharge. Left eye exhibits no discharge.  Neck: Normal range of motion. Neck supple.  Cardiovascular: Normal rate and regular rhythm.   No murmur heard. Pulmonary/Chest: Effort normal and breath sounds normal.  Abdominal: Soft. Bowel sounds are normal. There is no tenderness.    Musculoskeletal: She exhibits no edema.  Neurological: She is alert and oriented to person, place, and time.  Skin: Skin is warm and dry.  Psychiatric: She has a normal mood and affect.  Nursing note and vitals reviewed.   BP 102/72 mmHg  Pulse 102  Temp(Src) 98.4 F (36.9 C) (Oral)  Ht 5' 6"  (1.676 m)  Wt 132 lb (59.875 kg)  BMI 21.32 kg/m2  SpO2 94% Wt Readings from Last 3 Encounters:  11/21/15 132 lb (59.875 kg)  11/01/15 133 lb 3.2 oz (60.419 kg)  08/15/15 128 lb 12.8 oz (58.423 kg)     Lab Results  Component Value Date   WBC 5.5 06/10/2015   HGB 12.7 06/10/2015   HCT 38.4 06/10/2015   PLT 274.0 06/10/2015   GLUCOSE 76 06/10/2015   CHOL 153 06/10/2015   TRIG 100.0 06/10/2015   HDL 61.60 06/10/2015   LDLDIRECT 117.4 01/13/2013   LDLCALC 72 06/10/2015   ALT 47* 06/10/2015   AST 34 06/10/2015   NA 140 06/10/2015   K 3.8 06/10/2015   CL 101 06/10/2015   CREATININE 0.73 06/10/2015   BUN 15 06/10/2015   CO2 31 06/10/2015   TSH 3.44 06/10/2015   INR 1.0 03/29/2011    Lab Results  Component Value Date   TSH 3.44 06/10/2015   Lab Results  Component Value Date   WBC 5.5 06/10/2015   HGB 12.7 06/10/2015   HCT 38.4 06/10/2015   MCV 91.5 06/10/2015   PLT 274.0 06/10/2015   Lab Results  Component Value Date   NA 140 06/10/2015   K 3.8 06/10/2015   CHLORIDE 105 05/09/2015   CO2 31 06/10/2015   GLUCOSE 76 06/10/2015   BUN 15 06/10/2015   CREATININE 0.73 06/10/2015   BILITOT 0.3 06/10/2015   ALKPHOS 130* 06/10/2015   AST 34 06/10/2015   ALT 47* 06/10/2015   PROT 7.5 06/10/2015   ALBUMIN 4.1 06/10/2015   CALCIUM 9.5 06/10/2015   ANIONGAP 8  05/09/2015   EGFR 71* 05/09/2015   GFR 83.12 06/10/2015   Lab Results  Component Value Date   CHOL 153 06/10/2015   Lab Results  Component Value Date   HDL 61.60 06/10/2015   Lab Results  Component Value Date   LDLCALC 72 06/10/2015   Lab Results  Component Value Date   TRIG 100.0 06/10/2015   Lab  Results  Component Value Date   CHOLHDL 2 06/10/2015   No results found for: HGBA1C     Assessment & Plan:   Problem List Items Addressed This Visit    Sinusitis, acute    Started on Cefdinir and Encouraged increased rest and hydration, add probiotics, zinc such as Coldeze or Xicam. Treat fevers as needed      Tachycardia - Primary    Mild, patient acutely ill, patient will report any concerning symptoms. Reminded to avoid Sudafed, DM etc         I am having Anne [name] start on cefdinir. I am also having her maintain her vitamin B-12, Vitamin D3, aspirin, Probiotic Product (PROBIOTIC FORMULA PO), loratadine, hyoscyamine, atorvastatin, LORazepam, diltiazem, amitriptyline, zoster vaccine live (PF), and pneumococcal 13-valent conjugate vaccine.  Meds ordered this encounter  Medications  . cefdinir (OMNICEF) 300 MG capsule    Sig: Take 1 capsule (300 mg total) by mouth 2 (two) times daily.    Dispense:  20 capsule    Refill:  0     Penni Homans, MD

## 2015-12-04 NOTE — Assessment & Plan Note (Signed)
Mild, patient acutely ill, patient will report any concerning symptoms. Reminded to avoid Sudafed, DM etc

## 2015-12-06 ENCOUNTER — Encounter: Payer: Self-pay | Admitting: Internal Medicine

## 2015-12-13 DIAGNOSIS — L820 Inflamed seborrheic keratosis: Secondary | ICD-10-CM | POA: Diagnosis not present

## 2015-12-13 DIAGNOSIS — L821 Other seborrheic keratosis: Secondary | ICD-10-CM | POA: Diagnosis not present

## 2015-12-13 DIAGNOSIS — D225 Melanocytic nevi of trunk: Secondary | ICD-10-CM | POA: Diagnosis not present

## 2015-12-14 ENCOUNTER — Other Ambulatory Visit: Payer: Self-pay | Admitting: Family Medicine

## 2015-12-14 NOTE — Progress Notes (Signed)
Cardiology Office Note   Date:  12/15/2015   ID:  Anne [name], [place] [birth year], MRN [id number]  PCP:  Penni Homans, MD  Cardiologist:  Dr. Harrington Challenger   Palpitations/chest pain    History of Present Illness: Anne [name] is a 75 y.o. female with a history of orthostatic intolerance, carotid stenosis, HLD, COPD, anemia, GERD, eosinophilic esophagitis, breast CA s/p R mastectomy (2008) who presents to clinic for evaluation of chest pain and palpitations.   Echo 3/14: Mild LVH, EF 0000000, grade 1 diastolic dysfunction. Event monitor 01/2013: NSR, extensive PVCs. Carotid US 03/2014: Stable 1-39% bilateral ICA stenosis.Patent vertebral arteries with antegrade flow. Normal subclavian arteries, bilaterally. f/u 2 years. Myoview 4/14: Low risk, no scar or ischemia, not gated. She has been treated with low dose metoprolol for PVCs and palps.   Last seen by Dr. Harrington Challenger in 08/2015 for follow up. She complained of chronic SOB.   Seen recently by PCP for MSK chest pain. Told to use OTC pain relievers.   Today she presents for follow up. She has been having palpitations for at least 1 month.  She called in and Dr. Harrington Challenger called in a Rx for diltiazem which has helped tremendously. She only gets palpations when she is upset or when she is thinking about it. She has also been having some chest pain for the last 5 days. It is a sharp pain in her right breast/chest area. It not associated SOB, diaphoresis or nausea. Its not related to exertion and it occurs randomly. It happens ~3 x daily and lasts for just seconds. She does exercise by walking every other day ~1.5 miles. She only gets SOB when she walks up inclines so she avoids this. Otherwise no SOB or chest pain with walking. No LE edema, orthopnea or PND. Se did have a URI recently with a bad cough.      Past Medical History  Diagnosis Date  . Anemia   . COPD (chronic obstructive pulmonary disease) ([place])   . Hyperlipidemia   . Pneumonia 2-10   pleurisy  . Arthritis   . Depression   . GERD (gastroesophageal reflux disease) 09/29/2009    improved s/p cholecystectomy and esophagus dilatation  . History of chicken pox   . History of shingles     2 episodes  . History of measles   . Osteopenia 03/14/2011  . 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 ESOPHAGEAL STRICTURE 03/29/2009  . ALKALINE PHOSPHATASE, ELEVATED 03/15/2009  . Anxiety and depression 04/28/2011  . Vaginitis 05/22/2011  . Baker's cyst of knee 05/22/2011  . Trauma 10/18/2011  . Radial neck fracture 10/2011    minimally displaced  . Atypical chest pain 11/30/2011  . Folliculitis of nose [id number]  . Esophageal ring   . 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 Urinary incontinence 03/19/2012  . Allergic state 06/10/2012  . Hematuria   . Dysphagia   . URI (upper respiratory infection) 09/02/2012  . Preventative health care 10/21/2012  . Dermatitis 11/23/2012  . Fall 11/23/2012  . Hx of echocardiogram     a. Echo 01/2013: mild LVH, EF 55-60%, normal wall motion, Gr 1 diast dysfn  . Knee pain, bilateral 07/23/2011  . Elevated sed rate 08/02/2013  . EE (eosinophilic esophagitis)   . Urinary frequency 12/12/2014  . Chronic alcoholism in remission ([place]) 03/29/2011    Did not  tolerate Klonopin, caused some confusion and bad dreams.    . Mixed hyperlipidemia 10/17/2010    Qualifier: Diagnosis of  By: Mack Guise    . Medicare annual wellness visit, subsequent 06/19/2015  . Orthostasis   . PVC's (premature ventricular contractions)     a. Event monitor 01/2013: NSR, extensive PVCs.  . Carotid artery disease ([place])     a. Carotid duplex 03/2014: stable 1-39% BICA, f/u due 03/2016.  [name] Sinusitis, acute 12/04/2015    Past Surgical History  Procedure Laterality Date  . Anterior cruciate ligament repair  08, 09, 10  . Ercp  2010     CBD stone extraction   . Cholecystectomy  2010  .  Esophagogastroduodenoscopy (egd) with esophageal dilation  2010, 2012  . Breast lumpectomy  2001  . Colonoscopy  09/05/10    cecal avm's  . Esophagogastroduodenoscopy  01/08/2012    Procedure: ESOPHAGOGASTRODUODENOSCOPY (EGD);  Surgeon: Gatha Mayer, MD;  Location: [facility];  Service: Endoscopy;  Laterality: N/A;  . Savory dilation  01/08/2012    Procedure: SAVORY DILATION;  Surgeon: Gatha Mayer, MD;  Location: [facility];  Service: Endoscopy;  Laterality: N/A;  need xray  . Mastectomy modified radical      , Mastectomy modified radical (08), breast reconstruction, CA lesions excised lateral abd wall 2010  . Breast reconstruction  2008, 2009, 2010     Current Outpatient Prescriptions  Medication Sig Dispense Refill  . amitriptyline (ELAVIL) 150 MG tablet Take 1 tablet (150 mg total) by mouth at bedtime. Sandos brand please 90 tablet 1  . aspirin 81 MG tablet Take 81 mg by mouth daily.      [name] atorvastatin (LIPITOR) 40 MG tablet TAKE 1 TABLET EVERY DAY 90 tablet 3  . Cholecalciferol (VITAMIN D3) 1000 UNITS CAPS Take 2 tablets by mouth daily.      [name] diltiazem (CARDIZEM) 30 MG tablet Take 1 tablet (30 mg total) by mouth daily as needed (for palpitations). 30 tablet 4  . hyoscyamine (LEVSIN SL) 0.125 MG SL tablet Place 1 tablet (0.125 mg total) under the tongue every 4 (four) hours as needed. 30 tablet 2  . loratadine (CLARITIN) 10 MG tablet Take 10 mg by mouth daily.    [name] LORazepam (ATIVAN) 1 MG tablet TAKE 1 TABLET BY MOUTH EVERY 8 HOURS IF NEEDED FOR ANXIETY 70 tablet 1  . pneumococcal 13-valent conjugate vaccine (PREVNAR 13) SUSP injection Inject 0.5 mLs into the muscle tomorrow at 10 am. 0.5 mL 0  . Probiotic Product (PROBIOTIC FORMULA PO) Take 1 capsule by mouth daily.    . vitamin B-12 (CYANOCOBALAMIN) 1000 MCG tablet Take 1,000 mcg by mouth daily.      [name] zoster vaccine live, PF, (ZOSTAVAX) 91478 UNT/0.65ML injection Inject 19,400 Units into the skin once. 1 each 0   No  current facility-administered medications for this visit.    Allergies:   Codeine; Diphenhydramine hcl; Morphine; Peanut-containing drug products; Sorbitol; Tomato; and Zofran    Social History:  The patient  reports that she quit smoking about 8 years ago. Her smoking use included Cigarettes. She has never used smokeless tobacco. She reports that she drinks about 0.6 oz of alcohol per week. She reports that she does not use illicit drugs.   Family History:  The patient's family history includes Alcohol abuse in her maternal grandfather; Anxiety disorder in her sister; Arthritis in her sister; Breast cancer in her maternal aunt and paternal aunt; Cirrhosis in her sister; Heart disease  in her father and paternal grandfather; Lung cancer in her father; Osteoporosis in her sister and sister; Other in her mother; Skin cancer in her sister; Stroke in her maternal grandmother. There is no history of Anesthesia problems, Hypotension, Malignant hyperthermia, Pseudochol deficiency, Colon cancer, Ovarian cancer, or Pancreatic cancer.    ROS:  Please see the history of present illness.   Otherwise, review of systems are positive for none.   All other systems are reviewed and negative.    PHYSICAL EXAM: VS:  BP 102/68 mmHg  Pulse 96  Ht 5\' 6"  (1.676 m)  Wt 133 lb (60.328 kg)  BMI 21.48 kg/m2 , BMI Body mass index is 21.48 kg/(m^2). GEN: Well nourished, well developed, in no acute distress HEENT: normal Neck: no JVD, carotid bruits, or masses Cardiac: RRR; no murmurs, rubs, or gallops,no edema  Respiratory:  clear to auscultation bilaterally, normal work of breathing GI: soft, nontender, nondistended, + BS MS: no deformity or atrophy Skin: warm and dry, no rash Neuro:  Strength and sensation are intact Psych: euthymic mood, full affect   EKG:  EKG is ordered today. The ekg ordered today demonstrates NSR with RBBB HR 89.    Recent Labs: 06/10/2015: ALT 47*; BUN 15; Creatinine, Ser 0.73;  Hemoglobin 12.7; Platelets 274.0; Potassium 3.8; Sodium 140; TSH 3.44    Lipid Panel    Component Value Date/Time   CHOL 153 06/10/2015 1216   TRIG 100.0 06/10/2015 1216   HDL 61.60 06/10/2015 1216   CHOLHDL 2 06/10/2015 1216   VLDL 20.0 06/10/2015 1216   LDLCALC 72 06/10/2015 1216   LDLDIRECT 117.4 01/13/2013 1210      Wt Readings from Last 3 Encounters:  12/15/15 133 lb (60.328 kg)  11/21/15 132 lb (59.875 kg)  11/01/15 133 lb 3.2 oz (60.419 kg)      Other studies Reviewed: Additional studies/ records that were reviewed today include: 2D ECHO, event monitor and myoview Review of the above records demonstrates: see HPI.    ASSESSMENT AND PLAN:  Anne [name] is a 75 y.o. female with a history of orthostatic intolerance, carotid stenosis, HLD, COPD, anemia, GERD, eosinophilic esophagitis, breast CA s/p R mastectomy (2008) who presents to clinic for evaluation of chest pain and palpitations.   1. Orthostatic intolerance: none recently.   2. Carotid artery disease: F/U carotid USN in May 2017  3. HLD: continue lipitor. LDL 72 in 05/2015.  4. Palpitations/Hx of PVCs:  These have gotten worse in the past month. She called in and Dr. Harrington Challenger started her on Diltiazem 30mg  daily every day. This has helped tremendously. Will plan to continue this.   5. Chest pain: totally atypical for cardiac chest pain. Patient agrees. Sounds MSK. Told her to continue OTC topical pain relievers. No ischemic w/up necessary  Current medicines are reviewed at length with the patient today.  The patient does not have concerns regarding medicines.  The following changes have been made:  no change  Labs/ tests ordered today include:  No orders of the defined types were placed in this encounter.     Disposition:   FU with Dr. Harrington Challenger in 3 months.   Renea Ee  12/15/2015 11:37 AM    [facility] [address], [place], [place]  [address] Phone: [phone]; Fax: [phone]

## 2015-12-14 NOTE — Telephone Encounter (Signed)
Requesting-Lorazepam 1mg -Take 1 tablet by mouth every 8 hours if needed for anxiety. Last refill:10/10/15;#70,1 Last OV:11/21/15 UDS:12/07/14-Moderate risk-Next screen-03/08/15 Please advise.//AB/CMA

## 2015-12-15 ENCOUNTER — Encounter: Payer: Self-pay | Admitting: Physician Assistant

## 2015-12-15 ENCOUNTER — Ambulatory Visit (INDEPENDENT_AMBULATORY_CARE_PROVIDER_SITE_OTHER): Payer: Commercial Managed Care - HMO | Admitting: Physician Assistant

## 2015-12-15 VITALS — BP 102/68 | HR 96 | Ht 66.0 in | Wt 133.0 lb

## 2015-12-15 DIAGNOSIS — R002 Palpitations: Secondary | ICD-10-CM

## 2015-12-15 NOTE — Patient Instructions (Signed)
Your physician recommends that you continue on your current medications as directed. Please refer to the Current Medication list given to you today.  Your physician recommends that you schedule a follow-up appointment in: 3 months with Dr Ross 

## 2015-12-15 NOTE — Telephone Encounter (Signed)
Faxed hardcopy for Lorazepam #70 with 1 refill to [facility].

## 2015-12-20 NOTE — Addendum Note (Signed)
Addended by: Freada Bergeron on: 12/20/2015 11:54 AM   Modules accepted: Orders

## 2016-01-02 ENCOUNTER — Telehealth: Payer: Self-pay | Admitting: *Deleted

## 2016-01-02 NOTE — Telephone Encounter (Signed)
"  I have not had my bones checked in a while.  Recently saw dentist Dr. Mariea Clonts to have dentures put in my upper bone like I had in lower mouth and he had a hard time.  Scan revealed there is no bone.  I will see a specialist to graph bone January 13, 2016 or I'll have to go around with no upper teeth.  I'm concerned what else is happening other places in my body.  If she wants to see my she can call my home [phone]) and it's okay to leave a message.  Otherwise only problem is right knee arthritis worsening so I may need a shot to help.  I have a sharp pain over my right eyebrow that comes and goes."  Next scheduled F/U 05-10-2016.

## 2016-01-05 DIAGNOSIS — M25561 Pain in right knee: Secondary | ICD-10-CM | POA: Diagnosis not present

## 2016-01-05 DIAGNOSIS — G8929 Other chronic pain: Secondary | ICD-10-CM | POA: Diagnosis not present

## 2016-01-06 DIAGNOSIS — M7121 Synovial cyst of popliteal space [Baker], right knee: Secondary | ICD-10-CM | POA: Diagnosis not present

## 2016-01-25 ENCOUNTER — Emergency Department (INDEPENDENT_AMBULATORY_CARE_PROVIDER_SITE_OTHER): Payer: Commercial Managed Care - HMO

## 2016-01-25 ENCOUNTER — Encounter: Payer: Self-pay | Admitting: *Deleted

## 2016-01-25 ENCOUNTER — Emergency Department
Admission: EM | Admit: 2016-01-25 | Discharge: 2016-01-25 | Disposition: A | Discharge status: Home / Self Care | Payer: Commercial Managed Care - HMO | Source: Home / Self Care | Attending: Family Medicine | Admitting: Family Medicine

## 2016-01-25 DIAGNOSIS — R071 Chest pain on breathing: Secondary | ICD-10-CM | POA: Diagnosis not present

## 2016-01-25 DIAGNOSIS — M549 Dorsalgia, unspecified: Secondary | ICD-10-CM | POA: Diagnosis not present

## 2016-01-25 DIAGNOSIS — R079 Chest pain, unspecified: Secondary | ICD-10-CM

## 2016-01-25 NOTE — ED Provider Notes (Signed)
CSN: [id number]     Arrival date & time 01/25/16  1704 History   First MD Initiated Contact with Patient 01/25/16 1710     Chief Complaint  Patient presents with  . Back Pain   (Consider location/radiation/quality/duration/timing/severity/associated sxs/prior Treatment) HPI The pt is a 75yo female presenting to [facility] with c/o Left sided mid back pain that started suddenly this morning, sharp and aching in nature, worse with deep breaths. Pt became concerned as she has a hx of COPD and has had pneumonia in the past. Denies recent cough, congestion, fever, chills, n/v/d. Denies recent falls or heavy lifting. Denies urinary symptoms and states she does not believe it is a kidney infection. No medication taken PTA. Denies leg pain or swelling.  Past Medical History  Diagnosis Date  . Anemia   . COPD (chronic obstructive pulmonary disease) ([place])   . Hyperlipidemia   . Pneumonia 2-10    pleurisy  . Arthritis   . Depression   . GERD (gastroesophageal reflux disease) 09/29/2009    improved s/p cholecystectomy and esophagus dilatation  . History of chicken pox   . History of shingles     2 episodes  . History of measles   . Osteopenia 03/14/2011  . 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 ESOPHAGEAL STRICTURE 03/29/2009  . ALKALINE PHOSPHATASE, ELEVATED 03/15/2009  . Anxiety and depression 04/28/2011  . Vaginitis 05/22/2011  . Baker's cyst of knee 05/22/2011  . Trauma 10/18/2011  . Radial neck fracture 10/2011    minimally displaced  . Atypical chest pain 11/30/2011  . Folliculitis of nose [id number]  . Esophageal ring   . 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 Urinary incontinence 03/19/2012  . Allergic state 06/10/2012  . Hematuria   . Dysphagia   . URI (upper respiratory infection) 09/02/2012  . Preventative health care 10/21/2012  . Dermatitis 11/23/2012  . Fall 11/23/2012  . Hx of echocardiogram     a.  Echo 01/2013: mild LVH, EF 55-60%, normal wall motion, Gr 1 diast dysfn  . Knee pain, bilateral 07/23/2011  . Elevated sed rate 08/02/2013  . EE (eosinophilic esophagitis)   . Urinary frequency 12/12/2014  . 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    . Mixed hyperlipidemia 10/17/2010    Qualifier: Diagnosis of  By: Mack Guise    . Medicare annual wellness visit, subsequent 06/19/2015  . Orthostasis   . PVC's (premature ventricular contractions)     a. Event monitor 01/2013: NSR, extensive PVCs.  . Carotid artery disease ([place])     a. Carotid duplex 03/2014: stable 1-39% BICA, f/u due 03/2016.  [name] Sinusitis, acute 12/04/2015   Past Surgical History  Procedure Laterality Date  . Anterior cruciate ligament repair  08, 09, 10  . Ercp  2010     CBD stone extraction   . Cholecystectomy  2010  . Esophagogastroduodenoscopy (egd) with esophageal dilation  2010, 2012  . Breast lumpectomy  2001  . Colonoscopy  09/05/10    cecal avm's  . Esophagogastroduodenoscopy  01/08/2012    Procedure: ESOPHAGOGASTRODUODENOSCOPY (EGD);  Surgeon: Gatha Mayer, MD;  Location: [facility];  Service: Endoscopy;  Laterality: N/A;  . Savory dilation  01/08/2012    Procedure: SAVORY DILATION;  Surgeon: Gatha Mayer, MD;  Location: [facility];  Service:  Endoscopy;  Laterality: N/A;  need xray  . Mastectomy modified radical      , Mastectomy modified radical (08), breast reconstruction, CA lesions excised lateral abd wall 2010  . Breast reconstruction  2008, 2009, 2010   Family History  Problem Relation Age of Onset  . Heart disease Father   . Lung cancer Father     smoker  . Cirrhosis Sister     Primary Biliary  . Breast cancer Maternal Aunt     x 3  . Stroke Maternal Grandmother   . Alcohol abuse Maternal Grandfather   . Heart disease Paternal Grandfather   . Anxiety disorder Sister   . Osteoporosis Sister   . Arthritis Sister      Rheumatoid  . Osteoporosis Sister   . Skin cancer Sister     multiple skin cancers, over 98 excisions.  . Other Mother     tic douloureux  . Anesthesia problems Neg Hx   . Hypotension Neg Hx   . Malignant hyperthermia Neg Hx   . Pseudochol deficiency Neg Hx   . Colon cancer Neg Hx   . Ovarian cancer Neg Hx   . Pancreatic cancer Neg Hx   . Breast cancer Paternal Aunt    Social History  Substance Use Topics  . Smoking status: Former Smoker    Types: Cigarettes    Quit date: 05/22/2007  . Smokeless tobacco: Never Used  . Alcohol Use: 0.6 oz/week    1 Glasses of wine per week     Comment: at least 6 oz daily   OB History    No data available     Review of Systems  Constitutional: Negative for fever and chills.  HENT: Negative for congestion, ear pain, sore throat, trouble swallowing and voice change.   Respiratory: Positive for shortness of breath. Negative for cough.   Cardiovascular: Negative for chest pain and palpitations.  Gastrointestinal: Negative for nausea, vomiting, abdominal pain and diarrhea.  Musculoskeletal: Positive for myalgias. Negative for back pain and arthralgias.  Skin: Negative for rash.    Allergies  Codeine; Diphenhydramine hcl; Morphine; Peanut-containing drug products; Sorbitol; Tomato; and Zofran  Home Medications   Prior to Admission medications   Medication Sig Start Date End Date Taking? Authorizing Provider  amitriptyline (ELAVIL) 150 MG tablet Take 1 tablet (150 mg total) by mouth at bedtime. Sandos brand please 11/01/15   [name], MD  aspirin 81 MG tablet Take 81 mg by mouth daily.      Historical Provider, MD  atorvastatin (LIPITOR) 40 MG tablet TAKE 1 TABLET EVERY DAY 03/29/15   [name], MD  Cholecalciferol (VITAMIN D3) 1000 UNITS CAPS Take 2 tablets by mouth daily.      Historical Provider, MD  diltiazem (CARDIZEM) 30 MG tablet Take 1 tablet (30 mg total) by mouth daily as needed (for palpitations). 10/26/15   Fay Records, MD  hyoscyamine (LEVSIN SL) 0.125 MG SL tablet Place 1 tablet (0.125 mg total) under the tongue every 4 (four) hours as needed. 03/04/15   [name], MD  loratadine (CLARITIN) 10 MG tablet Take 10 mg by mouth daily.    Historical Provider, MD  LORazepam (ATIVAN) 1 MG tablet TAKE 1 TABLET BY MOUTH EVERY 8 HOURS IF NEEDED FOR ANXIETY 12/14/15   [name], MD  pneumococcal 13-valent conjugate vaccine (PREVNAR 13) SUSP injection Inject 0.5 mLs into the muscle tomorrow at 10 am. 11/01/15   [name], MD  Probiotic Product (PROBIOTIC FORMULA PO) Take 1 capsule by mouth daily.    Historical Provider, MD  vitamin B-12 (CYANOCOBALAMIN) 1000 MCG tablet Take 1,000 mcg by mouth daily.      Historical Provider, MD  zoster vaccine live, PF, (ZOSTAVAX) 57846 UNT/0.65ML injection Inject 19,400 Units into the skin once. 11/01/15   [name], MD   Meds Ordered and Administered this Visit  Medications - No data to display  BP 135/75 mmHg  Pulse 99  Temp(Src) 98.1 F (36.7 C) (Oral)  Resp 16  Ht 5\' 6"  (1.676 m)  Wt 135 lb (61.236 kg)  BMI 21.80 kg/m2  SpO2 98% No data found.   Physical Exam  Constitutional: She appears well-developed and well-nourished. No distress.  HENT:  Head: Normocephalic and atraumatic.  Right Ear: Tympanic membrane normal.  Left Ear: Tympanic membrane normal.  Nose: Nose normal.  Mouth/Throat: Uvula is midline, oropharynx is clear and moist and mucous membranes are normal.  Eyes: Conjunctivae are normal. No scleral icterus.  Neck: Normal range of motion. Neck supple.  Cardiovascular: Normal rate, regular rhythm and normal heart sounds.   Pulmonary/Chest: Effort normal and breath sounds normal. No respiratory distress. She has no wheezes. She has no rales. She exhibits no tenderness.  Abdominal: Soft. Bowel sounds are normal. She exhibits no distension and no mass. There is no tenderness. There is no rebound, no guarding and no CVA tenderness.   Musculoskeletal: Normal range of motion. She exhibits no tenderness.  No midline spinal tenderness. Full ROM upper and lower extremities without difficulty. No muscular tenderness over described area of pain.  Neurological: She is alert.  Skin: Skin is warm and dry. No rash noted. She is not diaphoretic. No erythema.  Nursing note and vitals reviewed.   ED Course  Procedures (including critical care time)  Labs Review Labs Reviewed - No data to display  Imaging Review Dg Chest 2 View  01/25/2016  CLINICAL DATA:  LEFT chest pain with inspiration since this morning, mid back pain, pain with the deep breathing, history COPD, GERD, former smoker, RIGHT breast cancer post modified radical mastectomy and reconstruction EXAM: CHEST  2 VIEW COMPARISON:  09/24/2014 FINDINGS: Normal heart size, mediastinal contours, and pulmonary vascularity. Lungs minimally hyperinflated but clear. No pleural effusion or pneumothorax. Post RIGHT mastectomy and axillary node dissection with note of RIGHT breast prosthesis. IMPRESSION: No acute abnormalities. Electronically Signed   By: Lavonia Dana M.D.   On: 01/25/2016 17:36      MDM   1. Mid back pain on left side    Pt c/o Left mid-back pain. No other symptoms but she does have a hx of pneumonia and is concerned it may be pneumonia due to pain with deep inspiration.  O2 Sat 98% on RA. No respiratory distress. Denies leg pain or swelling. Doubt PE. No specific muscular tenderness on exam.  Discussed UTI, pt states she doubts UTI and does not want to check UA at this time. Requesting a CXR.    CXR: no acute abnormality. Reassured pt not signs of pneumonia.  Pain likely muscular in nature.  Encouraged alternating ice and heat, may also continue Aleve and acetaminophen. Encouraged to f/u with PCP in 1 week if not improving, sooner if worsening including cough or fever. Patient verbalized understanding and agreement with treatment plan.       Noland Fordyce,  PA-C 01/25/16 [phone]

## 2016-01-25 NOTE — ED Notes (Signed)
Pt c/o LT side mid back x today only with a deep breath. She reports hx of pneumonia, but denies any coughing or fever. No OTC meds.

## 2016-02-21 ENCOUNTER — Telehealth: Payer: Self-pay | Admitting: Family Medicine

## 2016-02-21 NOTE — Telephone Encounter (Signed)
Medication Detail       Disp Refills Start End      LORazepam (ATIVAN) 1 MG tablet 70 tablet 1 12/14/2015      Sig: TAKE 1 TABLET BY MOUTH EVERY 8 HOURS IF NEEDED FOR ANXIETY     Class: Print

## 2016-02-21 NOTE — Telephone Encounter (Signed)
Caller name: Self  Can be reached: [phone]  Pharmacy: [facility], [place] [phone] (Phone) [phone] (Fax)         Reason for call: Refill on LORazepam (ATIVAN) 1 MG tablet [id number]

## 2016-02-22 ENCOUNTER — Telehealth: Payer: Self-pay

## 2016-02-22 MED ORDER — LORAZEPAM 1 MG PO TABS: 1.0000 mg | ORAL_TABLET | Freq: Three times a day (TID) | ORAL | Status: DC | PRN

## 2016-02-22 NOTE — Telephone Encounter (Signed)
Pt is requesting refill on Lorazepam. Dr. Frederik Pear Pt.  Last OV: 11/21/2015 Last Fill: 12/14/2015 #70 and 1RF Pt sig: 1 tablet q8h PRN UDS: 12/07/2014 Moderate risk  Please advise.

## 2016-02-22 NOTE — Telephone Encounter (Signed)
Rx faxed to [facility] pharmacy.  

## 2016-02-22 NOTE — Telephone Encounter (Signed)
For further refills need to contact pcp

## 2016-03-02 ENCOUNTER — Other Ambulatory Visit (INDEPENDENT_AMBULATORY_CARE_PROVIDER_SITE_OTHER): Payer: Commercial Managed Care - HMO

## 2016-03-02 DIAGNOSIS — E785 Hyperlipidemia, unspecified: Secondary | ICD-10-CM | POA: Diagnosis not present

## 2016-03-02 DIAGNOSIS — R748 Abnormal levels of other serum enzymes: Secondary | ICD-10-CM

## 2016-03-02 DIAGNOSIS — R079 Chest pain, unspecified: Secondary | ICD-10-CM

## 2016-03-02 LAB — COMPREHENSIVE METABOLIC PANEL
ALK PHOS: 97 U/L (ref 39–117)
ALT: 20 U/L (ref 0–35)
AST: 17 U/L (ref 0–37)
Albumin: 4.2 g/dL (ref 3.5–5.2)
BILIRUBIN TOTAL: 0.5 mg/dL (ref 0.2–1.2)
BUN: 15 mg/dL (ref 6–23)
CO2: 31 mEq/L (ref 19–32)
Calcium: 9.6 mg/dL (ref 8.4–10.5)
Chloride: 102 mEq/L (ref 96–112)
Creatinine, Ser: 0.74 mg/dL (ref 0.40–1.20)
GFR: 81.66 mL/min (ref >60.00)
Glucose, Bld: 93 mg/dL (ref 70–99)
Potassium: 4 mEq/L (ref 3.5–5.1)
Sodium: 141 mEq/L (ref 135–145)
TOTAL PROTEIN: 7.3 g/dL (ref 6.0–8.3)

## 2016-03-02 LAB — CBC WITH DIFFERENTIAL/PLATELET
Basophils Absolute: 0 10*3/uL (ref 0.0–0.1)
Basophils Relative: 0.9 % (ref 0.0–3.0)
Eosinophils Absolute: 0.2 10*3/uL (ref 0.0–0.7)
Eosinophils Relative: 5.2 % — ABNORMAL HIGH (ref 0.0–5.0)
HCT: 36.8 % (ref 36.0–46.0)
HEMOGLOBIN: 12.4 g/dL (ref 12.0–15.0)
Lymphocytes Relative: 25.3 % (ref 12.0–46.0)
Lymphs Abs: 1.2 10*3/uL (ref 0.7–4.0)
MCHC: 33.6 g/dL (ref 30.0–36.0)
MCV: 91.8 fl (ref 78.0–100.0)
Monocytes Absolute: 0.4 10*3/uL (ref 0.1–1.0)
Monocytes Relative: 8.2 % (ref 3.0–12.0)
Neutro Abs: 2.8 10*3/uL (ref 1.4–7.7)
Neutrophils Relative %: 60.4 % (ref 43.0–77.0)
Platelets: 280 10*3/uL (ref 150.0–400.0)
RBC: 4.01 Mil/uL (ref 3.87–5.11)
RDW: 14.9 % (ref 11.5–15.5)
WBC: 4.6 10*3/uL (ref 4.0–10.5)

## 2016-03-02 LAB — LIPID PANEL
Cholesterol: 175 mg/dL (ref 0–200)
HDL: 74 mg/dL (ref >39.00)
LDL Cholesterol: 82 mg/dL (ref 0–99)
NonHDL: 100.54
TRIGLYCERIDES: 92 mg/dL (ref 0.0–149.0)
Total CHOL/HDL Ratio: 2
VLDL: 18.4 mg/dL (ref 0.0–40.0)

## 2016-03-02 LAB — TSH: TSH: 2.85 u[IU]/mL (ref 0.35–4.50)

## 2016-03-03 ENCOUNTER — Encounter: Payer: Self-pay | Admitting: Physician Assistant

## 2016-03-05 ENCOUNTER — Ambulatory Visit: Payer: Commercial Managed Care - HMO | Admitting: Family

## 2016-03-09 ENCOUNTER — Ambulatory Visit (INDEPENDENT_AMBULATORY_CARE_PROVIDER_SITE_OTHER): Payer: Commercial Managed Care - HMO | Admitting: Family

## 2016-03-09 ENCOUNTER — Encounter: Payer: Self-pay | Admitting: Family

## 2016-03-09 ENCOUNTER — Ambulatory Visit (HOSPITAL_BASED_OUTPATIENT_CLINIC_OR_DEPARTMENT_OTHER)
Admission: RE | Admit: 2016-03-09 | Discharge: 2016-03-09 | Disposition: A | Discharge status: Home / Self Care | Payer: Commercial Managed Care - HMO | Source: Ambulatory Visit | Attending: Family | Admitting: Family

## 2016-03-09 VITALS — BP 108/64 | HR 94 | Temp 98.5°F | Resp 16 | Ht 66.0 in | Wt 137.8 lb

## 2016-03-09 DIAGNOSIS — M25572 Pain in left ankle and joints of left foot: Secondary | ICD-10-CM | POA: Diagnosis not present

## 2016-03-09 DIAGNOSIS — F329 Major depressive disorder, single episode, unspecified: Secondary | ICD-10-CM

## 2016-03-09 DIAGNOSIS — K589 Irritable bowel syndrome without diarrhea: Secondary | ICD-10-CM | POA: Diagnosis not present

## 2016-03-09 DIAGNOSIS — F419 Anxiety disorder, unspecified: Secondary | ICD-10-CM

## 2016-03-09 DIAGNOSIS — F418 Other specified anxiety disorders: Secondary | ICD-10-CM | POA: Diagnosis not present

## 2016-03-09 DIAGNOSIS — M19072 Primary osteoarthritis, left ankle and foot: Secondary | ICD-10-CM | POA: Insufficient documentation

## 2016-03-09 DIAGNOSIS — M79672 Pain in left foot: Secondary | ICD-10-CM | POA: Diagnosis not present

## 2016-03-09 MED ORDER — LORAZEPAM 1 MG PO TABS: 1.0000 mg | ORAL_TABLET | Freq: Three times a day (TID) | ORAL | Status: DC | PRN

## 2016-03-09 NOTE — Assessment & Plan Note (Signed)
Trial of metamucil.  I am afraid that addition of med such as amitiza might worsen her diarrhea. If symptoms worsen or do not improve, consider referral to GI.

## 2016-03-09 NOTE — Progress Notes (Signed)
Pre visit review using our clinic review tool, if applicable. No additional management support is needed unless otherwise documented below in the visit note. 

## 2016-03-09 NOTE — Progress Notes (Signed)
Subjective:    Patient ID: Anne [name], female    DOB: [birth year], 75 y.o.   MRN: [id number]  HPI  Anne [name] is a 75 yr old female who presents today to discuss Bowel concerns. She report that she has BM every 3-4 days.  When she has a BM, BM starts out hard, then followed by diarrhea. + cramping, relieved with BM.  Knows she needs to eat more fiber and exercise more. Denies bloody/dark stools or fevers.    Anxiety- reports symptoms have worsened recently.  She has some upcoming dental implants which she finds stressful.  Son-in-law died on the 07-Mar-2023.  Trying to support her daughter.   Left Ankle pain-  "twisted ankle yesterday."  Hurts to bear weight  Review of Systems See HPI  Past Medical History  Diagnosis Date  . Anemia   . COPD (chronic obstructive pulmonary disease) ([place])   . Hyperlipidemia   . Pneumonia 2-10    pleurisy  . Arthritis   . Depression   . GERD (gastroesophageal reflux disease) 09/29/2009    improved s/p cholecystectomy and esophagus dilatation  . History of chicken pox   . History of shingles     2 episodes  . History of measles   . Osteopenia 03/14/2011  . 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 ESOPHAGEAL STRICTURE 03/29/2009  . ALKALINE PHOSPHATASE, ELEVATED 03/15/2009  . Anxiety and depression 04/28/2011  . Vaginitis 05/22/2011  . Baker's cyst of knee 05/22/2011  . Trauma 10/18/2011  . Radial neck fracture 10/2011    minimally displaced  . Atypical chest pain 11/30/2011  . Folliculitis of nose [id number]  . Esophageal ring   . 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 Urinary incontinence 03/19/2012  . Allergic state 06/10/2012  . Hematuria   . Dysphagia   . URI (upper respiratory infection) 09/02/2012  . Preventative health care 10/21/2012  . Dermatitis 11/23/2012  . Fall 11/23/2012  . Hx of echocardiogram     a. Echo 01/2013: mild LVH, EF 55-60%, normal wall motion,  Gr 1 diast dysfn  . Knee pain, bilateral 07/23/2011  . Elevated sed rate 08/02/2013  . EE (eosinophilic esophagitis)   . Urinary frequency 12/12/2014  . 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    . Mixed hyperlipidemia 10/17/2010    Qualifier: Diagnosis of  By: Mack Guise    . Medicare annual wellness visit, subsequent 06/19/2015  . Orthostasis   . PVC's (premature ventricular contractions)     a. Event monitor 01/2013: NSR, extensive PVCs.  . Carotid artery disease ([place])     a. Carotid duplex 03/2014: stable 1-39% BICA, f/u due 03/2016.  [name] Sinusitis, acute 12/04/2015     Social History   Social History  . Marital Status: Married    Spouse Name: N/A  . Number of Children: N/A  . Years of Education: N/A   Occupational History  . Not on file.   Social History Main Topics  . Smoking status: Former Smoker    Types: Cigarettes    Quit date: 05/22/2007  . Smokeless tobacco: Never Used  . Alcohol Use: 0.6 oz/week    1 Glasses of wine per week     Comment: at least 6 oz daily  . Drug Use: No  . Sexual Activity:  Partners: Male     Comment: lives with husband,    Other Topics Concern  . Not on file   Social History Narrative    Past Surgical History  Procedure Laterality Date  . Anterior cruciate ligament repair  08, 09, 10  . Ercp  2010     CBD stone extraction   . Cholecystectomy  2010  . Esophagogastroduodenoscopy (egd) with esophageal dilation  2010, 2012  . Breast lumpectomy  2001  . Colonoscopy  09/05/10    cecal avm's  . Esophagogastroduodenoscopy  01/08/2012    Procedure: ESOPHAGOGASTRODUODENOSCOPY (EGD);  Surgeon: Gatha Mayer, MD;  Location: [facility];  Service: Endoscopy;  Laterality: N/A;  . Savory dilation  01/08/2012    Procedure: SAVORY DILATION;  Surgeon: Gatha Mayer, MD;  Location: [facility];  Service: Endoscopy;  Laterality: N/A;  need xray  . Mastectomy modified  radical      , Mastectomy modified radical (08), breast reconstruction, CA lesions excised lateral abd wall 2010  . Breast reconstruction  2008, 2009, 2010    Family History  Problem Relation Age of Onset  . Heart disease Father   . Lung cancer Father     smoker  . Cirrhosis Sister     Primary Biliary  . Breast cancer Maternal Aunt     x 3  . Stroke Maternal Grandmother   . Alcohol abuse Maternal Grandfather   . Heart disease Paternal Grandfather   . Anxiety disorder Sister   . Osteoporosis Sister   . Arthritis Sister     Rheumatoid  . Osteoporosis Sister   . Skin cancer Sister     multiple skin cancers, over 57 excisions.  . Other Mother     tic douloureux  . Anesthesia problems Neg Hx   . Hypotension Neg Hx   . Malignant hyperthermia Neg Hx   . Pseudochol deficiency Neg Hx   . Colon cancer Neg Hx   . Ovarian cancer Neg Hx   . Pancreatic cancer Neg Hx   . Breast cancer Paternal Aunt     Allergies  Allergen Reactions  . Codeine Other (See Comments)    "flu like symptoms"  . Diphenhydramine Hcl Other (See Comments)    "nervous and upset"  . Morphine Other (See Comments)    "flu like symptoms"  . Peanut-Containing Drug Products Hives  . Sorbitol Other (See Comments)    GI Issues  . Tomato Diarrhea  . Zofran Other (See Comments)    HEADACHE    Current Outpatient Prescriptions on File Prior to Visit  Medication Sig Dispense Refill  . diltiazem (CARDIZEM) 30 MG tablet Take 1 tablet (30 mg total) by mouth daily as needed (for palpitations). 30 tablet 4  . amitriptyline (ELAVIL) 150 MG tablet Take 1 tablet (150 mg total) by mouth at bedtime. Sandos brand please 90 tablet 1  . aspirin 81 MG tablet Take 81 mg by mouth daily.      [name] atorvastatin (LIPITOR) 40 MG tablet TAKE 1 TABLET EVERY DAY 90 tablet 3  . Cholecalciferol (VITAMIN D3) 1000 UNITS CAPS Take 2 tablets by mouth daily.      . Probiotic Product (PROBIOTIC FORMULA PO) Take 1 capsule by mouth daily.     No  current facility-administered medications on file prior to visit.    BP 108/64 mmHg  Pulse 94  Temp(Src) 98.5 F (36.9 C) (Oral)  Resp 16  Ht 5\' 6"  (1.676 m)  Wt 137 lb 12.8 oz (62.506  kg)  BMI 22.25 kg/m2  SpO2 99%       Objective:   Physical Exam  Constitutional: She appears well-developed and well-nourished.  Cardiovascular: Normal rate, regular rhythm and normal heart sounds.   No murmur heard. Pulmonary/Chest: Effort normal and breath sounds normal. No respiratory distress. She has no wheezes.  Musculoskeletal:  No significant swelling of left ankle.   Psychiatric: She has a normal mood and affect. Her behavior is normal. Judgment and thought content normal.  Musculoskeletal: no left ankle swelling  noted        Assessment & Plan:  Left Ankle pain- obtain x ray to rule out fx, however I suspect strain

## 2016-03-09 NOTE — Assessment & Plan Note (Signed)
Anxiety has worsened, however she has had significant life stressors to explain increased anxiety. Refill provided for ativan.

## 2016-03-09 NOTE — Patient Instructions (Addendum)
Please complete x ray on the first floor.   Add a metamucil supplement once daily to help regulate your bowels. Let us know if your symptoms worsen or if symptoms do not improve.  Continue water and try to walk regularly.

## 2016-03-13 ENCOUNTER — Other Ambulatory Visit: Payer: Self-pay

## 2016-03-13 DIAGNOSIS — Z1231 Encounter for screening mammogram for malignant neoplasm of breast: Secondary | ICD-10-CM

## 2016-03-15 ENCOUNTER — Ambulatory Visit (HOSPITAL_COMMUNITY)
Admission: RE | Admit: 2016-03-15 | Discharge: 2016-03-15 | Disposition: A | Discharge status: Home / Self Care | Payer: Commercial Managed Care - HMO | Source: Ambulatory Visit | Attending: Cardiovascular Disease | Admitting: Cardiovascular Disease

## 2016-03-15 DIAGNOSIS — I6529 Occlusion and stenosis of unspecified carotid artery: Secondary | ICD-10-CM | POA: Diagnosis present

## 2016-03-15 DIAGNOSIS — I6523 Occlusion and stenosis of bilateral carotid arteries: Secondary | ICD-10-CM | POA: Insufficient documentation

## 2016-03-15 DIAGNOSIS — K219 Gastro-esophageal reflux disease without esophagitis: Secondary | ICD-10-CM | POA: Diagnosis not present

## 2016-03-15 DIAGNOSIS — E782 Mixed hyperlipidemia: Secondary | ICD-10-CM | POA: Diagnosis not present

## 2016-03-15 DIAGNOSIS — M8589 Other specified disorders of bone density and structure, multiple sites: Secondary | ICD-10-CM | POA: Diagnosis not present

## 2016-03-15 DIAGNOSIS — R002 Palpitations: Secondary | ICD-10-CM | POA: Insufficient documentation

## 2016-03-16 ENCOUNTER — Telehealth: Payer: Self-pay | Admitting: Internal Medicine

## 2016-03-20 ENCOUNTER — Encounter: Payer: Self-pay | Admitting: Internal Medicine

## 2016-03-20 ENCOUNTER — Encounter: Payer: Self-pay | Admitting: Oncology

## 2016-03-22 ENCOUNTER — Ambulatory Visit: Payer: Commercial Managed Care - HMO | Admitting: Internal Medicine

## 2016-03-23 ENCOUNTER — Telehealth: Payer: Self-pay

## 2016-03-23 NOTE — Telephone Encounter (Signed)
Spoke with Anne [name] on 03-23-16.  Gave her the results per Dr. Marko Plume.  See phone note dated 03-23-16.

## 2016-03-23 NOTE — Telephone Encounter (Signed)
Told Anne [name] that the bone density scan showed osteopenia.  Regular weight bearing exercise such as walking is important.  Ms. Brockway verbalized understanding.

## 2016-03-26 ENCOUNTER — Ambulatory Visit: Payer: Commercial Managed Care - HMO | Admitting: Internal Medicine

## 2016-03-27 ENCOUNTER — Ambulatory Visit
Admission: RE | Admit: 2016-03-27 | Discharge: 2016-03-27 | Disposition: A | Discharge status: Home / Self Care | Payer: Commercial Managed Care - HMO | Source: Ambulatory Visit

## 2016-03-27 DIAGNOSIS — Z1231 Encounter for screening mammogram for malignant neoplasm of breast: Secondary | ICD-10-CM | POA: Diagnosis not present

## 2016-03-28 ENCOUNTER — Encounter: Payer: Self-pay | Admitting: Oncology

## 2016-03-29 ENCOUNTER — Other Ambulatory Visit: Payer: Self-pay | Admitting: Oncology

## 2016-03-29 ENCOUNTER — Telehealth: Payer: Self-pay

## 2016-03-29 ENCOUNTER — Encounter: Payer: Self-pay | Admitting: Oncology

## 2016-03-29 NOTE — Progress Notes (Signed)
Medical Oncology  MyChart message from patient that she is concerned about area of right mastectomy/ reconstruction (?), requesting [place].  Left tomo mammogram 03-28-16 dense breast tissue otherwise not remarkable.  Needs to see provider, consider [place] or MRI breast if concerns identified. POF sent.  L.Parag Dorton

## 2016-03-29 NOTE — Telephone Encounter (Signed)
Status: Signed       Expand All Collapse All   Medical Oncology  MyChart message from patient that she is concerned about area of right mastectomy/ reconstruction (?), requesting [place].  Left tomo mammogram 03-28-16 dense breast tissue otherwise not remarkable.  Needs to see provider, consider [place] or MRI breast if concerns identified. POF sent.  L.[place]

## 2016-03-29 NOTE — Telephone Encounter (Signed)
LM in patient's home vm to call back regarding Dr. Mariana Kaufman recommendations.

## 2016-03-30 ENCOUNTER — Telehealth: Payer: Self-pay | Admitting: Oncology

## 2016-03-30 NOTE — Telephone Encounter (Signed)
lvm for pt regarding to 5.22 appt

## 2016-03-30 NOTE — Telephone Encounter (Signed)
Reached Ms. Bialy and she is comfortable with appointment on 04-02-16 with Mikey Bussing, NP  to discuss [place] or MRI is needed as noted below by Dr. Marko Plume.

## 2016-04-02 ENCOUNTER — Encounter: Payer: Self-pay | Admitting: Oncology

## 2016-04-02 ENCOUNTER — Telehealth: Payer: Self-pay | Admitting: Oncology

## 2016-04-02 ENCOUNTER — Ambulatory Visit (HOSPITAL_BASED_OUTPATIENT_CLINIC_OR_DEPARTMENT_OTHER): Payer: Commercial Managed Care - HMO | Admitting: Oncology

## 2016-04-02 VITALS — BP 107/67 | HR 98 | Temp 98.1°F | Resp 18 | Ht 66.0 in | Wt 135.7 lb

## 2016-04-02 DIAGNOSIS — Z853 Personal history of malignant neoplasm of breast: Secondary | ICD-10-CM | POA: Diagnosis not present

## 2016-04-02 DIAGNOSIS — Z87891 Personal history of nicotine dependence: Secondary | ICD-10-CM | POA: Diagnosis not present

## 2016-04-02 DIAGNOSIS — M8580 Other specified disorders of bone density and structure, unspecified site: Secondary | ICD-10-CM

## 2016-04-02 DIAGNOSIS — C50911 Malignant neoplasm of unspecified site of right female breast: Secondary | ICD-10-CM

## 2016-04-02 NOTE — Progress Notes (Signed)
OFFICE PROGRESS NOTE   Apr 02, 2016   Physicians:S.Cindy Hazy (urology), C.Gessner, G.Chapman Fitch Sanger, Dorris Carnes  INTERVAL HISTORY:  Patient is seen, together with husband, in a work in appointment for follow up of history of right breast cancers. Patient had a recent mammogram of her left breast and while at the breast Center inquired about imaging on her right breast. It was suggested that she obtain an ultrasound of the right breast. She is here today to discuss imaging of the right breast. The patient indicates that she has had a sharp electric-like pain to the right breast that happened over the weekend. Denies pain today. Denies skin changes or nodularity. Reports that she is very concerned about her right breast and the possibility of recurrence of her cancer. She had tomo left mammogram at [facility] 03-27-16, with heterogeneously dense breast tissue but no other mammographic findings of concern. Last DEXA in this EMR was at [facility] 02-15-14, with lowest T score -1.9. Patient states that she had a recent DEXA scan and would like a copy of the results. She is unsure where she had this done and I am unable to locate the results in our EMR. She follows with PCP Dr Charlett Blake regularly   She reports appetite ok and no pain. She denies SOB. She does not have other complaints that seem referable to the breast cancer history or previous treatment.  No central catheter  ONCOLOGIC HISTORY History is of 2 primary lobular right breast carcinomas. Her initial diagnosis was March 2001, treated systemically with CMF followed by 5 years of tamoxifen. Second diagnosis was Jan. 2008 (tho patient did not agree to surgery until July 2008) and subsequently had no adjuvant systemic treatment at her decision. She had a complicated course with difficult healing after right breast reconstruction. She had skin recurrence lateral right chest wall excised in Sept 2010 then treated with xeloda from Oct  2010 thru Feb 2011 and began on Arimidex July 2011; she may have stopped Arimidex after bone density scan 02-2014, or possibly the year prior (?). She has had no documented active disease since this most recent treatment, including PET 05-14-12 with no evidence of metastatic disease. Bone density scan [facility] 02-15-14 still osteopenic range, slightly lower in LS compared with 2013 and stable in hips. Last left mammogram at [facility] 02-25-15    Review of systems as above, also: No recent infectious illness. Occasional episodes of fatigue resolve with resting, generally able to do regular activities including yard work, has not resumed regular walking for exercise. No cardiac symptoms. States eating regularly. Remainder of 10 point Review of Systems negative.  Objective:  Vital signs in last 24 hours:  BP 107/67 mmHg  Pulse 98  Temp(Src) 98.1 F (36.7 C) (Oral)  Resp 18  Ht 5' 6"  (1.676 m)  Wt 135 lb 11.2 oz (61.553 kg)  BMI 21.91 kg/m2  SpO2 96% Weight is up 6 lbs.  Alert, oriented and appropriate. Ambulatory without assistance difficulty.  Alopecia  HEENT:PERRL, sclerae not icteric. Oral mucosa moist without lesions, posterior pharynx clear.  Neck supple. No JVD.  Lymphatics:no cervical,supraclavicular, axillary or inguinal adenopathy Resp: clear to auscultation bilaterally and normal percussion bilaterally Cardio: regular rate and rhythm. No gallop. GI: soft, nontender, not distended, no mass or organomegaly. Normally active bowel sounds.  Musculoskeletal/ Extremities: without pitting edema, cords, tenderness Neuro: speech fluent, otherwise nonfocal. Psych appropriate mood and affect Skin without rash, ecchymosis, petechiae Breasts: Reconstructed right breast no findings of  concern for local recurrence including laterally where recurrent disease excised; left breast without dominant mass, skin or nipple findings. Axillae benign.   Lab Results:  Results for orders placed or  performed in visit on 03/02/16  CBC with Differential/Platelet  Result Value Ref Range   WBC 4.6 4.0 - 10.5 K/uL   RBC 4.01 3.87 - 5.11 Mil/uL   Hemoglobin 12.4 12.0 - 15.0 g/dL   HCT 36.8 36.0 - 46.0 %   MCV 91.8 78.0 - 100.0 fl   MCHC 33.6 30.0 - 36.0 g/dL   RDW 14.9 11.5 - 15.5 %   Platelets 280.0 150.0 - 400.0 K/uL   Neutrophils Relative % 60.4 43.0 - 77.0 %   Lymphocytes Relative 25.3 12.0 - 46.0 %   Monocytes Relative 8.2 3.0 - 12.0 %   Eosinophils Relative 5.2 (H) 0.0 - 5.0 %   Basophils Relative 0.9 0.0 - 3.0 %   Neutro Abs 2.8 1.4 - 7.7 K/uL   Lymphs Abs 1.2 0.7 - 4.0 K/uL   Monocytes Absolute 0.4 0.1 - 1.0 K/uL   Eosinophils Absolute 0.2 0.0 - 0.7 K/uL   Basophils Absolute 0.0 0.0 - 0.1 K/uL  Comp Met (CMET)  Result Value Ref Range   Sodium 141 135 - 145 mEq/L   Potassium 4.0 3.5 - 5.1 mEq/L   Chloride 102 96 - 112 mEq/L   CO2 31 19 - 32 mEq/L   Glucose, Bld 93 70 - 99 mg/dL   BUN 15 6 - 23 mg/dL   Creatinine, Ser 0.74 0.40 - 1.20 mg/dL   Total Bilirubin 0.5 0.2 - 1.2 mg/dL   Alkaline Phosphatase 97 39 - 117 U/L   AST 17 0 - 37 U/L   ALT 20 0 - 35 U/L   Total Protein 7.3 6.0 - 8.3 g/dL   Albumin 4.2 3.5 - 5.2 g/dL   Calcium 9.6 8.4 - 10.5 mg/dL   GFR 81.66 >60.00 mL/min  TSH  Result Value Ref Range   TSH 2.85 0.35 - 4.50 uIU/mL  Lipid panel  Result Value Ref Range   Cholesterol 175 0 - 200 mg/dL   Triglycerides 92.0 0.0 - 149.0 mg/dL   HDL 74.00 >39.00 mg/dL   VLDL 18.4 0.0 - 40.0 mg/dL   LDL Cholesterol 82 0 - 99 mg/dL   Total CHOL/HDL Ratio 2    NonHDL 100.54      Studies/Results:  [facility] with breast tissue still heterogeneously dense but no other mammographic findings of concern.   Medications: I have reviewed the patient's current medications. She is on supplemental Vit D and takes Tums for calcium.  DISCUSSION: Patient is very concerned regarding cancer recurrence in her right breast. She is  concerned that she had a sharp electric-like pain in her right breast this past weekend. She would like to pursue imaging of the right breast. I have requested a right breast ultrasound to be done the breast Center within one week.  Assessment/Plan: 1.two primary lobular right breast carcinomas, in 2001 and 2008, with lateral right chest wall recurrence 07-2009: She did not resume arimidex when I saw her 10-2014 and prefers not to do this. Left tomo mammogram ok 03-2016 and should repeat 1 year. [name] Keep return visit for labs and to see M.D in June 2017. 2.osteopenia: needs at least 1200 mg calcium daily. Resume regular weight bearing exercise. 3.long past tobacco 4.heavy ETOH in past, likely continues 5.hx anxiety and depression 6.needs  flu vaccine this fall 7.chronic urologic problems known to urology 8.previous esophageal dilitations  Patient and husband are in agreement with recommendations and plans as above.  Time spent 15 min including >50% counseling and coordination of care.      Mikey Bussing, NP   04/02/2016, 2:03 PM

## 2016-04-02 NOTE — Telephone Encounter (Signed)
per pof to sch pt appt-adv pt of [facility] # to call to do screening and sch appt-adv to call asap

## 2016-04-03 ENCOUNTER — Other Ambulatory Visit: Payer: Self-pay | Admitting: Family Medicine

## 2016-04-05 ENCOUNTER — Encounter: Payer: Self-pay | Admitting: Medical

## 2016-04-05 ENCOUNTER — Ambulatory Visit (HOSPITAL_BASED_OUTPATIENT_CLINIC_OR_DEPARTMENT_OTHER)
Admission: RE | Admit: 2016-04-05 | Discharge: 2016-04-05 | Disposition: A | Discharge status: Home / Self Care | Payer: Commercial Managed Care - HMO | Source: Ambulatory Visit | Attending: Medical | Admitting: Medical

## 2016-04-05 ENCOUNTER — Ambulatory Visit (INDEPENDENT_AMBULATORY_CARE_PROVIDER_SITE_OTHER): Payer: Commercial Managed Care - HMO | Admitting: Medical

## 2016-04-05 VITALS — BP 104/70 | HR 68 | Temp 98.0°F | Ht 66.0 in | Wt 134.8 lb

## 2016-04-05 DIAGNOSIS — M25551 Pain in right hip: Secondary | ICD-10-CM

## 2016-04-05 DIAGNOSIS — F411 Generalized anxiety disorder: Secondary | ICD-10-CM | POA: Diagnosis not present

## 2016-04-05 DIAGNOSIS — B029 Zoster without complications: Secondary | ICD-10-CM | POA: Diagnosis not present

## 2016-04-05 DIAGNOSIS — M160 Bilateral primary osteoarthritis of hip: Secondary | ICD-10-CM | POA: Insufficient documentation

## 2016-04-05 DIAGNOSIS — M1611 Unilateral primary osteoarthritis, right hip: Secondary | ICD-10-CM | POA: Diagnosis not present

## 2016-04-05 MED ORDER — LORAZEPAM 1 MG PO TABS: 1.0000 mg | ORAL_TABLET | Freq: Three times a day (TID) | ORAL | Status: DC | PRN

## 2016-04-05 MED ORDER — FAMCICLOVIR 500 MG PO TABS: 500.0000 mg | ORAL_TABLET | Freq: Three times a day (TID) | ORAL | Status: DC

## 2016-04-05 NOTE — Addendum Note (Signed)
Addended by: Anabel Halon on: 04/05/2016 03:37 PM   Modules accepted: Orders

## 2016-04-05 NOTE — Progress Notes (Signed)
Pre visit review using our clinic review tool, if applicable. No additional management support is needed unless otherwise documented below in the visit note. 

## 2016-04-05 NOTE — Patient Instructions (Addendum)
Your pain in left hip may repesent early shingles without rash outbreak. You mentioned prior shingle with delayed rash onset. Will go ahead and rx famvir.  You can continue otc lidocaine patch and your amitryptline  For your rt hip region pain on range of motion will go ahead and order rt hip xray. Could use low dose ibuprofen for pain in rt hip if worsens.(xray hip today)   For your anxiety history I will refill you ativan. But for refills I ask you call pcp.  Follow up in 7-10 days or as needed

## 2016-04-05 NOTE — Progress Notes (Addendum)
Subjective:    Patient ID: Anne [name], female    DOB: 1940/11/18, 75 y.o.   MRN: [id number]  HPI  Pt in for evaluation. She thinks she may have shingles. She had itching sensation to her left hip/buttox area on Sunday. Then itching stopped. She got some pain in this area. Patient states has pain in this area. Area is sensitive. Pt got otc pain patch and she applied. Pt did mention this to NP earlier in the week. Pt has not seen a rash. Describes sharp continuous.  Then pt states Tuesday some rt hip area pain that seems to radiate down her rt leg. But this pain is not as bad as her rt hip area pan.  Pt thinks you can get shingles on both sides. She states this occurred on both sides in the past.  Anxiety hx and needs refill. Just 3 days until current tabs run out.     Review of Systems  Constitutional: Negative for fever, chills and fatigue.  Respiratory: Negative for cough, chest tightness and wheezing.   Cardiovascular: Positive for chest pain. Negative for palpitations.  Musculoskeletal: Negative for back pain.       Lt hip region pain.  Rt as well.  Neurological:       Lt hip area pain  Hematological: Negative for adenopathy. Does not bruise/bleed easily.  Psychiatric/Behavioral: Negative for hallucinations, behavioral problems and confusion. The patient is nervous/anxious.     Past Medical History  Diagnosis Date  . Anemia   . COPD (chronic obstructive pulmonary disease) (HCC)   . Hyperlipidemia   . Pneumonia 2-10    pleurisy  . Arthritis   . Depression   . GERD (gastroesophageal reflux disease) 09/29/2009    improved s/p cholecystectomy and esophagus dilatation  . History of chicken pox   . History of shingles     2 episodes  . History of measles   . Osteopenia 03/14/2011  . Cancer (HCC) 01,  08    XRT/chemo 01-02/ lobular invasive ca  . 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 ESOPHAGEAL STRICTURE 03/29/2009  . ALKALINE PHOSPHATASE, ELEVATED 03/15/2009  .  Anxiety and depression 04/28/2011  . Vaginitis 05/22/2011  . Baker's cyst of knee 05/22/2011  . Trauma 10/18/2011  . Radial neck fracture 10/2011    minimally displaced  . Atypical chest pain 11/30/2011  . Folliculitis of nose 12/31/2011  . Esophageal ring   . 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 Urinary incontinence 03/19/2012  . Allergic state 06/10/2012  . Hematuria   . Dysphagia   . URI (upper respiratory infection) 09/02/2012  . Preventative health care 10/21/2012  . Dermatitis 11/23/2012  . Fall 11/23/2012  . Hx of echocardiogram     a. Echo 01/2013: mild LVH, EF 55-60%, normal wall motion, Gr 1 diast dysfn  . Knee pain, bilateral 07/23/2011  . Elevated sed rate 08/02/2013  . EE (eosinophilic esophagitis)   . Urinary frequency 12/12/2014  . 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    . Mixed hyperlipidemia 10/17/2010    Qualifier: Diagnosis of  By: Omar Person    . Medicare annual wellness visit, subsequent 06/19/2015  . Orthostasis   . PVC's (premature ventricular contractions)     a. Event monitor 01/2013: NSR, extensive PVCs.  . Carotid artery disease (HCC)     a. Carotid duplex 03/2014: stable 1-39% BICA, f/u  due 03/2016.  [name] Sinusitis, acute 12/04/2015     Social History   Social History  . Marital Status: Married    Spouse Name: N/A  . Number of Children: N/A  . Years of Education: N/A   Occupational History  . Not on file.   Social History Main Topics  . Smoking status: Former Smoker    Types: Cigarettes    Quit date: 05/22/2007  . Smokeless tobacco: Never Used  . Alcohol Use: 0.6 oz/week    1 Glasses of wine per week     Comment: at least 6 oz daily  . Drug Use: No  . Sexual Activity:    Partners: Male     Comment: lives with husband,    Other Topics Concern  . Not on file   Social History Narrative    Past Surgical History  Procedure Laterality Date  .  Anterior cruciate ligament repair  08, 09, 10  . Ercp  2010     CBD stone extraction   . Cholecystectomy  2010  . Esophagogastroduodenoscopy (egd) with esophageal dilation  2010, 2012  . Breast lumpectomy  2001  . Colonoscopy  09/05/10    cecal avm's  . Esophagogastroduodenoscopy  01/08/2012    Procedure: ESOPHAGOGASTRODUODENOSCOPY (EGD);  Surgeon: [name], MD;  Location: [facility];  Service: Endoscopy;  Laterality: N/A;  . Savory dilation  01/08/2012    Procedure: SAVORY DILATION;  Surgeon: [name], MD;  Location: [facility];  Service: Endoscopy;  Laterality: N/A;  need xray  . Mastectomy modified radical      , Mastectomy modified radical (08), breast reconstruction, CA lesions excised lateral abd wall 2010  . Breast reconstruction  2008, 2009, 2010    Family History  Problem Relation Age of Onset  . Heart disease Father   . Lung cancer Father     smoker  . Cirrhosis Sister     Primary Biliary  . Breast cancer Maternal Aunt     x 3  . Stroke Maternal Grandmother   . Alcohol abuse Maternal Grandfather   . Heart disease Paternal Grandfather   . Anxiety disorder Sister   . Osteoporosis Sister   . Arthritis Sister     Rheumatoid  . Osteoporosis Sister   . Skin cancer Sister     multiple skin cancers, over 90 excisions.  . Other Mother     tic douloureux  . Anesthesia problems Neg Hx   . Hypotension Neg Hx   . Malignant hyperthermia Neg Hx   . Pseudochol deficiency Neg Hx   . Colon cancer Neg Hx   . Ovarian cancer Neg Hx   . Pancreatic cancer Neg Hx   . Breast cancer Paternal Aunt     Allergies  Allergen Reactions  . Codeine Other (See Comments)    "flu like symptoms"  . Morphine Other (See Comments)    "flu like symptoms"  . Peanut-Containing Drug Products Hives  . Sorbitol Other (See Comments)    GI Issues  . Tomato Diarrhea  . Zofran Other (See Comments)    headache  . Diphenhydramine Hcl Other (See Comments)    "nervous and upset"     Current Outpatient Prescriptions on File Prior to Visit  Medication Sig Dispense Refill  . amitriptyline (ELAVIL) 150 MG tablet Take 1 tablet (150 mg total) by mouth at bedtime. Sandos brand please 90 tablet 1  . aspirin 81 MG tablet Take 81 mg by mouth daily.      [name]  atorvastatin (LIPITOR) 40 MG tablet TAKE 1 TABLET EVERY DAY 90 tablet 3  . Cholecalciferol (VITAMIN D3) 1000 UNITS CAPS Take 2 tablets by mouth daily.      [name] diltiazem (CARDIZEM) 30 MG tablet Take 1 tablet (30 mg total) by mouth daily as needed (for palpitations). 30 tablet 4  . LORazepam (ATIVAN) 1 MG tablet Take 1 tablet (1 mg total) by mouth every 8 (eight) hours as needed for anxiety. 70 tablet 0  . Probiotic Product (PROBIOTIC FORMULA PO) Take 1 capsule by mouth daily.     No current facility-administered medications on file prior to visit.    BP 104/70 mmHg  Pulse 68  Temp(Src) 98 F (36.7 C) (Oral)  Ht 5\' 6"  (1.676 m)  Wt 134 lb 12.8 oz (61.145 kg)  BMI 21.77 kg/m2  SpO2 98%       Objective:   Physical Exam   General- No acute distress. Pleasant patient. Neck- Full range of motion, no jvd Lungs- Clear, even and unlabored. Heart- regular rate and rhythm. Neurologic- CNII- XII grossly intact.  Lt hip- region no rash presently.  Skin- no rash on her left side.  Rt hip- faint pain on rom. No crepitus.     Assessment & Plan:  Your pain in left hip may repesent early shingles without rash outbreak. You mentioned prior shingle with delayed rash onset. Will go ahead and rx famvir.  You can continue otc lidocaine patch and your amitryptline  For your rt hip region pain on range of motion will go ahead and order rt hip xray. Could use low dose ibuprofen for pain in rt hip if worsens.(xray hip today)   For your anxiety history I will refill you ativan. But for refills I ask you call pcp.  Follow up in 7-10 days or as needed   Scotlyn Mccranie, Ramon Dredge, [employer]

## 2016-04-09 ENCOUNTER — Encounter: Payer: Self-pay | Admitting: Family Medicine

## 2016-04-10 NOTE — Progress Notes (Signed)
Quick Note:  Pt has seen results on MyChart and message also sent for patient to call back if any questions. ______ 

## 2016-04-11 ENCOUNTER — Other Ambulatory Visit: Payer: Commercial Managed Care - HMO

## 2016-04-12 ENCOUNTER — Ambulatory Visit
Admission: RE | Admit: 2016-04-12 | Discharge: 2016-04-12 | Disposition: A | Discharge status: Home / Self Care | Payer: Commercial Managed Care - HMO | Source: Ambulatory Visit | Attending: Oncology | Admitting: Oncology

## 2016-04-12 DIAGNOSIS — N644 Mastodynia: Secondary | ICD-10-CM | POA: Diagnosis not present

## 2016-04-12 DIAGNOSIS — C50911 Malignant neoplasm of unspecified site of right female breast: Secondary | ICD-10-CM

## 2016-04-20 ENCOUNTER — Ambulatory Visit (INDEPENDENT_AMBULATORY_CARE_PROVIDER_SITE_OTHER): Payer: Commercial Managed Care - HMO | Admitting: Internal Medicine

## 2016-04-20 ENCOUNTER — Encounter: Payer: Self-pay | Admitting: Internal Medicine

## 2016-04-20 VITALS — BP 110/68 | HR 75 | Ht 66.0 in | Wt 138.8 lb

## 2016-04-20 DIAGNOSIS — E785 Hyperlipidemia, unspecified: Secondary | ICD-10-CM | POA: Diagnosis not present

## 2016-04-20 DIAGNOSIS — I6529 Occlusion and stenosis of unspecified carotid artery: Secondary | ICD-10-CM

## 2016-04-20 DIAGNOSIS — R42 Dizziness and giddiness: Secondary | ICD-10-CM | POA: Diagnosis not present

## 2016-04-20 NOTE — Patient Instructions (Signed)
Medication Instructions:  The current medical regimen is effective;  continue present plan and medications.  Follow-Up: Follow up in February 2018 with Dr Harrington Challenger.  If you need a refill on your cardiac medications before your next appointment, please call your pharmacy.  Thank you for choosing [facility]!!

## 2016-04-20 NOTE — Progress Notes (Signed)
Cardiology Office Note   Date:  04/20/2016   ID:  Anne [name], Anne [name] [birth year], MRN [id number]  PCP:  Penni Homans, MD  Cardiologist:   Dorris Carnes, MD   F/U of orthostatic intolerance    History of Present Illness: Anne [name] is a 75 y.o. female with a history of orthostatic intolerance, carotid stenosis, HL, COPD, anemia, GERD, eosinophilic esophagitis, breast CA. Echo 3/14: Mild LVH, EF 0000000, grade 1 diastolic dysfunction. Event monitor 01/2013: NSR, extensive PVCs. Carotid US [id number]: RICA [id number], LICA [id number] (f/u 1 year). Myoview 4/14: Low risk, no scar or ischemia, not gated. She has been treated with low dose metoprolol for PVCs and palps.   Dince seen the pt has intermitt dizziens  Not severe  No syncope Fairly active   No Cp  No signif palpitations.      Outpatient Prescriptions Prior to Visit  Medication Sig Dispense Refill  . amitriptyline (ELAVIL) 150 MG tablet Take 1 tablet (150 mg total) by mouth at bedtime. Sandos brand please 90 tablet 1  . aspirin 81 MG tablet Take 81 mg by mouth daily.      [name] atorvastatin (LIPITOR) 40 MG tablet TAKE 1 TABLET EVERY DAY 90 tablet 3  . Cholecalciferol (VITAMIN D3) 1000 UNITS CAPS Take 2 tablets by mouth daily.      [name] diltiazem (CARDIZEM) 30 MG tablet Take 1 tablet (30 mg total) by mouth daily as needed (for palpitations). 30 tablet 4  . famciclovir (FAMVIR) 500 MG tablet Take 1 tablet (500 mg total) by mouth 3 (three) times daily. 21 tablet 0  . LORazepam (ATIVAN) 1 MG tablet Take 1 tablet (1 mg total) by mouth every 8 (eight) hours as needed for anxiety. 70 tablet 0  . Probiotic Product (PROBIOTIC FORMULA PO) Take 1 capsule by mouth daily.     No facility-administered medications prior to visit.     Allergies:   Codeine; Morphine; Peanut-containing drug products; Sorbitol; Tomato; Zofran; and Diphenhydramine hcl   Past Medical History  Diagnosis Date  . Anemia   . COPD (chronic obstructive pulmonary disease) ([place])     . Hyperlipidemia   . Pneumonia 2-10    pleurisy  . Arthritis   . Depression   . GERD (gastroesophageal reflux disease) 09/29/2009    improved s/p cholecystectomy and esophagus dilatation  . History of chicken pox   . History of shingles     2 episodes  . History of measles   . Osteopenia 03/14/2011  . Cancer ([place]) 01,  08    XRT/chemo 01-02/ lobular invasive ca  . 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 ESOPHAGEAL STRICTURE 03/29/2009  . ALKALINE PHOSPHATASE, ELEVATED 03/15/2009  . Anxiety and depression 04/28/2011  . Vaginitis 05/22/2011  . Baker's cyst of knee 05/22/2011  . Trauma 10/18/2011  . Radial neck fracture 10/2011    minimally displaced  . Atypical chest pain 11/30/2011  . Folliculitis of nose [id number]  . Esophageal ring   . 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 Urinary incontinence 03/19/2012  . Allergic state 06/10/2012  . Hematuria   . Dysphagia   . URI (upper respiratory infection) 09/02/2012  . Preventative health care 10/21/2012  . Dermatitis 11/23/2012  . Fall 11/23/2012  . Hx of echocardiogram     a. Echo 01/2013: mild LVH, EF 55-60%, normal wall motion, Gr 1 diast dysfn  . Knee pain, bilateral 07/23/2011  . Elevated sed rate 08/02/2013  . EE (  eosinophilic esophagitis)   . Urinary frequency 12/12/2014  . Chronic alcoholism in remission ([place]) 03/29/2011    Did not tolerate Klonopin, caused some confusion and bad dreams.    . Mixed hyperlipidemia 10/17/2010    Qualifier: Diagnosis of  By: Mack Guise    . Medicare annual wellness visit, subsequent 06/19/2015  . Orthostasis   . PVC's (premature ventricular contractions)     a. Event monitor 01/2013: NSR, extensive PVCs.  . Carotid artery disease ([place])     a. Carotid duplex 03/2014: stable 1-39% BICA, f/u due 03/2016.  [name] Sinusitis, acute 12/04/2015    Past Surgical History  Procedure Laterality Date  . Anterior cruciate ligament repair  08, 09, 10  . Ercp  2010     CBD stone extraction   .  Cholecystectomy  2010  . Esophagogastroduodenoscopy (egd) with esophageal dilation  2010, 2012  . Breast lumpectomy  2001  . Colonoscopy  09/05/10    cecal avm's  . Esophagogastroduodenoscopy  01/08/2012    Procedure: ESOPHAGOGASTRODUODENOSCOPY (EGD);  Surgeon: Gatha Mayer, MD;  Location: [facility];  Service: Endoscopy;  Laterality: N/A;  . Savory dilation  01/08/2012    Procedure: SAVORY DILATION;  Surgeon: Gatha Mayer, MD;  Location: [facility];  Service: Endoscopy;  Laterality: N/A;  need xray  . Mastectomy modified radical      , Mastectomy modified radical (08), breast reconstruction, CA lesions excised lateral abd wall 2010  . Breast reconstruction  2008, 2009, 2010     Social History:  The patient  reports that she quit smoking about 8 years ago. Her smoking use included Cigarettes. She has never used smokeless tobacco. She reports that she drinks about 0.6 oz of alcohol per week. She reports that she does not use illicit drugs.   Family History:  The patient's family history includes Alcohol abuse in her maternal grandfather; Anxiety disorder in her sister; Arthritis in her sister; Breast cancer in her maternal aunt and paternal aunt; Cirrhosis in her sister; Heart disease in her father and paternal grandfather; Lung cancer in her father; Osteoporosis in her sister and sister; Other in her mother; Skin cancer in her sister; Stroke in her maternal grandmother. There is no history of Anesthesia problems, Hypotension, Malignant hyperthermia, Pseudochol deficiency, Colon cancer, Ovarian cancer, or Pancreatic cancer.    ROS:  Please see the history of present illness. All other systems are reviewed and  Negative to the above problem except as noted.    PHYSICAL EXAM: VS:  BP 110/68 mmHg  Pulse 75  Ht 5\' 6"  (1.676 m)  Wt 138 lb 12.8 oz (62.959 kg)  BMI 22.41 kg/m2  GEN: Well nourished, well developed, in no acute distress HEENT: normalL ear with cerumen  Small amount of TM  visible   Neck: no JVD, carotid bruits, or masses Cardiac: RRR; no murmurs, rubs, or gallops,no edema  Respiratory:  clear to auscultation bilaterally, normal work of breathing GI: soft, nontender, nondistended, + BS  No hepatomegaly  MS: no deformity Moving all extremities   Skin: warm and dry, no rash Neuro:  Strength and sensation are intact Psych: euthymic mood, full affect   EKG:  EKG is not  ordered today.   Lipid Panel    Component Value Date/Time   CHOL 175 03/02/2016 1025   TRIG 92.0 03/02/2016 1025   HDL 74.00 03/02/2016 1025   CHOLHDL 2 03/02/2016 1025   VLDL 18.4 03/02/2016 1025   LDLCALC 82 03/02/2016 1025  LDLDIRECT 117.4 01/13/2013 1210      Wt Readings from Last 3 Encounters:  04/20/16 138 lb 12.8 oz (62.959 kg)  04/05/16 134 lb 12.8 oz (61.145 kg)  04/02/16 135 lb 11.2 oz (61.553 kg)      ASSESSMENT AND PLAN:  1  Dizzienss  Intermittent  No syncope  Encouraged adequate salt/fluid  2  CV dz  Mild plaquing of carotid  Continue asa and statin  3  HL  Good control of lipids   4.  Ear  L ear with cerumen  She can use softener and wit bulb syringe flush with H2O2 and water  F/u with Dr Charlett Blake otherwise     Signed, Dorris Carnes, MD  04/20/2016 11:01 AM    [address], [place], [place]  [address] Phone: [phone]; Fax: [phone]

## 2016-05-06 ENCOUNTER — Other Ambulatory Visit: Payer: Self-pay | Admitting: Oncology

## 2016-05-06 DIAGNOSIS — Z853 Personal history of malignant neoplasm of breast: Secondary | ICD-10-CM

## 2016-05-08 ENCOUNTER — Other Ambulatory Visit: Payer: Self-pay

## 2016-05-08 NOTE — Telephone Encounter (Signed)
Please advise on refill. Pt seen Edward on 04/05/16.  Per last note in the system pt was due for UDS on 03/07/16.

## 2016-05-08 NOTE — Telephone Encounter (Signed)
I refilled pt ativan one time. Large number and high dose. She takes frequently. Would prefer that pcp rx. I filled med last time when I saw pt. Would you forward request. I thinks Dr. Charlett Blake pcp.

## 2016-05-08 NOTE — Telephone Encounter (Signed)
Pt was last seen 04/05/16. Please advise on refill.

## 2016-05-10 ENCOUNTER — Other Ambulatory Visit (HOSPITAL_BASED_OUTPATIENT_CLINIC_OR_DEPARTMENT_OTHER): Payer: Commercial Managed Care - HMO

## 2016-05-10 ENCOUNTER — Ambulatory Visit (HOSPITAL_BASED_OUTPATIENT_CLINIC_OR_DEPARTMENT_OTHER): Payer: Commercial Managed Care - HMO | Admitting: Oncology

## 2016-05-10 ENCOUNTER — Ambulatory Visit (INDEPENDENT_AMBULATORY_CARE_PROVIDER_SITE_OTHER): Payer: Commercial Managed Care - HMO | Admitting: Family Medicine

## 2016-05-10 ENCOUNTER — Encounter: Payer: Self-pay | Admitting: Oncology

## 2016-05-10 ENCOUNTER — Telehealth: Payer: Self-pay | Admitting: Oncology

## 2016-05-10 ENCOUNTER — Ambulatory Visit (HOSPITAL_BASED_OUTPATIENT_CLINIC_OR_DEPARTMENT_OTHER)
Admission: RE | Admit: 2016-05-10 | Discharge: 2016-05-10 | Disposition: A | Discharge status: Home / Self Care | Payer: Commercial Managed Care - HMO | Source: Ambulatory Visit | Attending: Family Medicine | Admitting: Family Medicine

## 2016-05-10 VITALS — BP 110/75 | HR 96 | Temp 98.8°F | Resp 17 | Ht 66.0 in | Wt 136.8 lb

## 2016-05-10 VITALS — BP 126/82 | HR 87 | Temp 98.2°F | Resp 16 | Ht 66.0 in | Wt 136.8 lb

## 2016-05-10 DIAGNOSIS — Z853 Personal history of malignant neoplasm of breast: Secondary | ICD-10-CM

## 2016-05-10 DIAGNOSIS — R079 Chest pain, unspecified: Secondary | ICD-10-CM | POA: Diagnosis not present

## 2016-05-10 DIAGNOSIS — M8580 Other specified disorders of bone density and structure, unspecified site: Secondary | ICD-10-CM

## 2016-05-10 DIAGNOSIS — R0789 Other chest pain: Secondary | ICD-10-CM

## 2016-05-10 DIAGNOSIS — S298XXA Other specified injuries of thorax, initial encounter: Secondary | ICD-10-CM

## 2016-05-10 DIAGNOSIS — K219 Gastro-esophageal reflux disease without esophagitis: Secondary | ICD-10-CM

## 2016-05-10 DIAGNOSIS — S299XXA Unspecified injury of thorax, initial encounter: Secondary | ICD-10-CM

## 2016-05-10 DIAGNOSIS — X58XXXA Exposure to other specified factors, initial encounter: Secondary | ICD-10-CM | POA: Insufficient documentation

## 2016-05-10 DIAGNOSIS — C792 Secondary malignant neoplasm of skin: Secondary | ICD-10-CM

## 2016-05-10 DIAGNOSIS — Z9889 Other specified postprocedural states: Secondary | ICD-10-CM

## 2016-05-10 DIAGNOSIS — C50911 Malignant neoplasm of unspecified site of right female breast: Secondary | ICD-10-CM

## 2016-05-10 LAB — CBC WITH DIFFERENTIAL/PLATELET
BASO%: 0.3 % (ref 0.0–2.0)
BASOS ABS: 0 10*3/uL (ref 0.0–0.1)
EOS ABS: 0.2 10*3/uL (ref 0.0–0.5)
EOS%: 2.8 % (ref 0.0–7.0)
HCT: 37.8 % (ref 34.8–46.6)
HEMOGLOBIN: 12.4 g/dL (ref 11.6–15.9)
LYMPH%: 27.1 % (ref 14.0–49.7)
MCH: 31.1 pg (ref 25.1–34.0)
MCHC: 32.8 g/dL (ref 31.5–36.0)
MCV: 94.7 fL (ref 79.5–101.0)
MONO#: 0.5 10*3/uL (ref 0.1–0.9)
MONO%: 8.1 % (ref 0.0–14.0)
NEUT#: 3.8 10*3/uL (ref 1.5–6.5)
NEUT%: 61.7 % (ref 38.4–76.8)
Platelets: 227 10*3/uL (ref 145–400)
RBC: 3.99 10*6/uL (ref 3.70–5.45)
RDW: 14.1 % (ref 11.2–14.5)
WBC: 6.2 10*3/uL (ref 3.9–10.3)
lymph#: 1.7 10*3/uL (ref 0.9–3.3)

## 2016-05-10 MED ORDER — LORAZEPAM 1 MG PO TABS: 1.0000 mg | ORAL_TABLET | Freq: Three times a day (TID) | ORAL | Status: DC | PRN

## 2016-05-10 MED ORDER — TRAMADOL HCL 50 MG PO TABS: 50.0000 mg | ORAL_TABLET | Freq: Two times a day (BID) | ORAL | Status: DC | PRN

## 2016-05-10 NOTE — Progress Notes (Signed)
Pre visit review using our clinic review tool, if applicable. No additional management support is needed unless otherwise documented below in the visit note. 

## 2016-05-10 NOTE — Patient Instructions (Signed)
Costochondritis °Costochondritis, sometimes called Tietze syndrome, is a swelling and irritation (inflammation) of the tissue (cartilage) that connects your ribs with your breastbone (sternum). It causes pain in the chest and rib area. Costochondritis usually goes away on its own over time. It can take up to 6 weeks or longer to get better, especially if you are unable to limit your activities. °CAUSES  °Some cases of costochondritis have no known cause. Possible causes include: °· Injury (trauma). °· Exercise or activity such as lifting. °· Severe coughing. °SIGNS AND SYMPTOMS °· Pain and tenderness in the chest and rib area. °· Pain that gets worse when coughing or taking deep breaths. °· Pain that gets worse with specific movements. °DIAGNOSIS  °Your health care provider will do a physical exam and ask about your symptoms. Chest X-rays or other tests may be done to rule out other problems. °TREATMENT  °Costochondritis usually goes away on its own over time. Your health care provider may prescribe medicine to help relieve pain. °HOME CARE INSTRUCTIONS  °· Avoid exhausting physical activity. Try not to strain your ribs during normal activity. This would include any activities using chest, abdominal, and side muscles, especially if heavy weights are used. °· Apply ice to the affected area for the first 2 days after the pain begins. °¨ Put ice in a plastic bag. °¨ Place a towel between your skin and the bag. °¨ Leave the ice on for 20 minutes, 2-3 times a day. °· Only take over-the-counter or prescription medicines as directed by your health care provider. °SEEK MEDICAL CARE IF: °· You have redness or swelling at the rib joints. These are signs of infection. °· Your pain does not go away despite rest or medicine. °SEEK IMMEDIATE MEDICAL CARE IF:  °· Your pain increases or you are very uncomfortable. °· You have shortness of breath or difficulty breathing. °· You cough up blood. °· You have worse chest pains,  sweating, or vomiting. °· You have a fever or persistent symptoms for more than 2-3 days. °· You have a fever and your symptoms suddenly get worse. °MAKE SURE YOU:  °· Understand these instructions. °· Will watch your condition. °· Will get help right away if you are not doing well or get worse. °  °This information is not intended to replace advice given to you by your health care provider. Make sure you discuss any questions you have with your health care provider. °  °Document Released: 08/08/2005 Document Revised: 08/19/2013 Document Reviewed: 06/02/2013 °Elsevier Interactive Patient Education ©2016 [employer] Inc. ° °

## 2016-05-10 NOTE — Telephone Encounter (Signed)
appt made and avs printed. Pt appt with plastic surgery 8/8 at 2pm Dr. Marla Roe

## 2016-05-10 NOTE — Progress Notes (Signed)
OFFICE PROGRESS NOTE   May 11, 2016   Physicians: S.Alena Bills (urology), C.Gessner, G.Ines Bloomer ([place]) [place], Dietrich Pates  INTERVAL HISTORY:  Patient is seen, together with husband, in scheduled one year follow up by this MD for history of 2 primary right breast cancers; she also saw APP on 04-02-16 for discomfort at reconstructed right breast.  She has been on observation since she discontinued Arimidex either ~ 02-2014 or possibly during the year prior. History is of 2 primary lobular right breast carcinomas, most recently recurrent to skin lateral reconstructed breast in 07-2009.  She had left tomo mammogram at [facility] 03-28-16, then [place] of reconstructed right breast at [facility] 04-12-16.  Patient has had no further "electric pains" at reconstructed right breast since that evaluation in early 04-2016. She is not aware of any changes in reconstructed right breast or left breast now, but continues to be displeased with results of the reconstruction and requests consultation with another plastic surgeon. As she had previously met Dr Wayland Denis, we will refer back to her office.  Patient has no complaints otherwise that seem clearly related to the history of breast cancer or that treatment. She denies any recent infectious illness. She fell down outside stairs in past week, with painful left ribs, extensive bruising left hip and abrasions left forearm; she has appointment with PCP later today in this regard. She denies SOB, different GI symptoms, any bleeding.    No central catheter No genetics testing per my documentation dated 08-01-12. Patient in agreement with referral for genetics counseling now, due to history of 2 primary breast cancers.   ONCOLOGIC HISTORY History is of 2 primary lobular right breast carcinomas. 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 I believe that she had local radiation, tho that information is not available in this EMR. Second diagnosis was Jan. 2008 (tho patient did not agree to surgery until July 2008), invasive lobular carcinoma  ER 81%, PR 96%, Her 2 1+  ([id number]) , 1.5 cm invasive lobular with 3 axillary nodes negative, LVSI and perineural invasion present ([id number]).  She subsequently declined adjuvant systemic treatment . She had a complicated course following right mastectomy, with difficult healing after right breast reconstruction. She had skin recurrence lateral right chest wall excised in Sept 2010 then treated with xeloda from Oct 2010 thru Feb 2011 and began on Arimidex July 2011; she may have stopped Arimidex after bone density scan 02-2014, or possibly the year prior (?). She has had no documented active disease since this most recent treatment, including PET 05-14-12 with no evidence of metastatic disease. Bone density scan [facility] 02-15-14 still osteopenic range, slightly lower in LS compared with 2013 and stable in hips.    Objective:  Vital signs in last 24 hours:  BP 110/75 mmHg  Pulse 96  Temp(Src) 98.8 F (37.1 C) (Oral)  Resp 17  Ht 5\' 6"  (1.676 m)  Wt 136 lb 12.8 oz (62.052 kg)  BMI 22.09 kg/m2  SpO2 95%  weight up 8 lbs from 04-2015. Alert, oriented and appropriate. Ambulatory without difficulty. Respirations not labored. Does not appear in any acute discomfort  HEENT:PERRL, sclerae not icteric. Oral mucosa moist without lesions, posterior pharynx clear.  Neck supple. No JVD.  Lymphatics:no cervical,suraclavicular, axillary  adenopathy Resp: clear to auscultation bilaterally and normal percussion bilaterally Cardio: regular rate and rhythm. No gallop. GI: soft, nontender, not distended, no mass or organomegaly. Normally active bowel sounds.  Musculoskeletal/  Extremities: LE without pitting edema, cords, tenderness Neuro: no peripheral neuropathy. Otherwise nonfocal Skin abrasions left forearm do not appear infected. Extensive ecchymosis left hip to flank. 2 probable insect bites lower back, erythematous. Otherwise without rash, petechiae. OTC lidocaine patch over left lower ribs anteriorly Breasts: Reconstructed right breast without evidence of local recurrence including laterally in area of previous skin involvement. 2 circular areas ~ 1.5 cm diameter slightly pink and not palpable at ~ 10:00 and ~ 1:00.  Left breast without dominant mass, skin or nipple findings. Axillae benign.  Lab Results:  Results for orders placed or performed in visit on 05/10/16  CBC with Differential  Result Value Ref Range   WBC 6.2 3.9 - 10.3 10e3/uL   NEUT# 3.8 1.5 - 6.5 10e3/uL   HGB 12.4 11.6 - 15.9 g/dL   HCT 37.8 34.8 - 46.6 %   Platelets 227 145 - 400 10e3/uL   MCV 94.7 79.5 - 101.0 fL   MCH 31.1 25.1 - 34.0 pg   MCHC 32.8 31.5 - 36.0 g/dL   RBC 3.99 3.70 - 5.45 10e6/uL   RDW 14.1 11.2 - 14.5 %   lymph# 1.7 0.9 - 3.3 10e3/uL   MONO# 0.5 0.1 - 0.9 10e3/uL   Eosinophils Absolute 0.2 0.0 - 0.5 10e3/uL   Basophils Absolute 0.0 0.0 - 0.1 10e3/uL   NEUT% 61.7 38.4 - 76.8 %   LYMPH% 27.1 14.0 - 49.7 %   MONO% 8.1 0.0 - 14.0 %   EOS% 2.8 0.0 - 7.0 %   BASO% 0.3 0.0 - 2.0 %    CMET in this EMR not remarkable 03-02-16, not repeated.   Studies/Results: 2D DIGITAL SCREENING UNILATERAL LEFT MAMMOGRAM WITH CAD AND ADJUNCT TOMO      03-28-16  COMPARISON: Previous exam(s).  ACR Breast Density Category c: The breast tissue is heterogeneously dense, which may obscure small masses.  FINDINGS: There are no findings suspicious for malignancy. Images were processed with CAD.  IMPRESSION: No mammographic evidence of malignancy. A result letter of this screening mammogram will be mailed directly to the patient.  RECOMMENDATION: Screening mammogram in one  year. (Code:[id number])  BI-RADS CATEGORY 1: Negative.     EXAM: ULTRASOUND OF THE RIGHT BREAST  04-12-16  COMPARISON: None.  FINDINGS: On physical exam, there is no palpable abnormality in the right chest wall. The implant appears intact and is soft to palpation.  Targeted ultrasound is performed, showing normal appearing subcutaneous tissues overlying the right breast implant. The implant is intact. The scar from the prior incision is noted far laterally. No pathologic lymphadenopathy is identified in the right axilla.  IMPRESSION: No evidence of recurrent malignancy in the right chest wall in this patient who has undergone prior right mastectomy and implant reconstruction.  RECOMMENDATION: Followup imaging of the right mastectomy site as determined by clinical examination. Annual screening mammography of the contralateral left breast which is due in 1 year.   Medications: I have reviewed the patient's current medications.  DISCUSSION Clinically doing well from standpoint of breast cancer history. At patient's request she will be referred to Dr Audelia Hives for consideration of other procedure for the asymmetry of reconstructed right breast and left breast. NOTE long and complicated recovery from the right reconstuction by Dr Towanda Malkin  Discussed indications for genetics testing, reviewed  information that she has not had testing done. Patient is in agreement with referral.   Assessment/Plan:   1.two primary lobular right breast carcinomas, in 2001 and 2008, with lateral right chest wall recurrence 07-2009: patient discontinued Arimidex after either 3 or 4 years. Referral to genetics : see my documentation note 08-01-12. Continue yearly follow up at this office. Left breast imaging due 03-2017. 2.osteopenia: needs at least 1200 mg calcium daily. Encouraged regular weight bearing exercise 3.long past tobacco 4.history of heavy ETOH  5.hx anxiety and  depression 6.needs flu vaccine this fall 7.chronic urologic problems known to urology 8.previous esophageal dilitations 9. Patient not satisfied with reconstruction due to asymmetry of breasts. Consultation with plastic surgery requested. NOTE difficult and prolonged healing of right reconstruction previously 10.fall on steps earlier this week, with rib pain and extensive bruising. To see PCP later today  All questions answered. Time spent 25 min including >50% counseling and coordination of care. Route Dr Randel Pigg, cc Dr Kevan Ny, MD   05/11/2016, 9:42 PM

## 2016-05-11 ENCOUNTER — Telehealth: Payer: Self-pay | Admitting: Oncology

## 2016-05-11 DIAGNOSIS — Z853 Personal history of malignant neoplasm of breast: Secondary | ICD-10-CM | POA: Insufficient documentation

## 2016-05-11 DIAGNOSIS — M8580 Other specified disorders of bone density and structure, unspecified site: Secondary | ICD-10-CM | POA: Insufficient documentation

## 2016-05-11 DIAGNOSIS — C792 Secondary malignant neoplasm of skin: Secondary | ICD-10-CM

## 2016-05-11 DIAGNOSIS — C50919 Malignant neoplasm of unspecified site of unspecified female breast: Secondary | ICD-10-CM | POA: Insufficient documentation

## 2016-05-11 DIAGNOSIS — Z9889 Other specified postprocedural states: Secondary | ICD-10-CM | POA: Insufficient documentation

## 2016-05-11 NOTE — Telephone Encounter (Signed)
Faxed pt records to Dr. Marla Roe [phone]

## 2016-05-12 HISTORY — PX: DENTAL SURGERY: SHX609

## 2016-05-16 ENCOUNTER — Encounter: Payer: Self-pay | Admitting: Family Medicine

## 2016-05-16 DIAGNOSIS — R079 Chest pain, unspecified: Secondary | ICD-10-CM | POA: Insufficient documentation

## 2016-05-16 NOTE — Assessment & Plan Note (Signed)
Avoid offending foods, start probiotics. Do not eat large meals in late evening and consider raising head of bed.  

## 2016-05-16 NOTE — Assessment & Plan Note (Signed)
Encouraged to get adequate exercise, calcium and vitamin d intake 

## 2016-05-16 NOTE — Progress Notes (Signed)
Patient ID: Anne [name], female   DOB: [birth year], 75 y.o.   MRN: [id number]   Subjective:    Patient ID: Anne [name], female    DOB: 12-24-1940, 75 y.o.   MRN: [id number]  Chief Complaint  Patient presents with  . Fall    Stumbled over rubber mat on patio last Sunday, bruising on L hip, having L lower rib cage pain  . Medication Refill    Ativan, sent from pharmacy 05/08/16, refused by Percell Miller, stated would prefer for PCP to fill    HPI Patient is in today for evaluation of chest wall pain and left hip pain after a fall in her yard 5 days ago. She is improving some but pain is worse in her lower anterior ribs when she moves a certain way or coughs. No SOB or palpitations. No incontinence or radiculopathy. Denies palp/SOB/HA/congestion/fevers/GI or GU c/o. Taking meds as prescribed  Past Medical History  Diagnosis Date  . Anemia   . COPD (chronic obstructive pulmonary disease) ([place])   . Hyperlipidemia   . Pneumonia 2-10    pleurisy  . Arthritis   . Depression   . GERD (gastroesophageal reflux disease) 09/29/2009    improved s/p cholecystectomy and esophagus dilatation  . History of chicken pox   . History of shingles     2 episodes  . History of measles   . Osteopenia 03/14/2011  . Cancer ([place]) 01,  08    XRT/chemo 01-02/ lobular invasive ca  . 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 ESOPHAGEAL STRICTURE 03/29/2009  . ALKALINE PHOSPHATASE, ELEVATED 03/15/2009  . Anxiety and depression 04/28/2011  . Vaginitis 05/22/2011  . Baker's cyst of knee 05/22/2011  . Trauma 10/18/2011  . Radial neck fracture 10/2011    minimally displaced  . Atypical chest pain 11/30/2011  . Folliculitis of nose 0/15/6153  . Esophageal ring   . 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 Urinary incontinence 03/19/2012  . Allergic state 06/10/2012  . Hematuria   . Dysphagia   . URI (upper respiratory infection) 09/02/2012  . Preventative health care 10/21/2012  . Dermatitis  11/23/2012  . Fall 11/23/2012  . Hx of echocardiogram     a. Echo 01/2013: mild LVH, EF 55-60%, normal wall motion, Gr 1 diast dysfn  . Knee pain, bilateral 07/23/2011  . Elevated sed rate 08/02/2013  . EE (eosinophilic esophagitis)   . Urinary frequency 12/12/2014  . 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    . Mixed hyperlipidemia 10/17/2010    Qualifier: Diagnosis of  By: Mack Guise    . Medicare annual wellness visit, subsequent 06/19/2015  . Orthostasis   . PVC's (premature ventricular contractions)     a. Event monitor 01/2013: NSR, extensive PVCs.  . Carotid artery disease ([place])     a. Carotid duplex 03/2014: stable 1-39% BICA, f/u due 03/2016.  [name] Sinusitis, acute 12/04/2015    Past Surgical History  Procedure Laterality Date  . Anterior cruciate ligament repair  08, 09, 10  . Ercp  2010     CBD stone extraction   . Cholecystectomy  2010  . Esophagogastroduodenoscopy (egd) with esophageal dilation  2010, 2012  . Breast lumpectomy  2001  . Colonoscopy  09/05/10    cecal avm's  . Esophagogastroduodenoscopy  01/08/2012    Procedure: ESOPHAGOGASTRODUODENOSCOPY (EGD);  Surgeon: Gatha Mayer, MD;  Location: [facility];  Service: Endoscopy;  Laterality: N/A;  . Savory dilation  01/08/2012    Procedure: SAVORY DILATION;  Surgeon: Gatha Mayer, MD;  Location: [facility];  Service: Endoscopy;  Laterality: N/A;  need xray  . Mastectomy modified radical      , Mastectomy modified radical (08), breast reconstruction, CA lesions excised lateral abd wall 2010  . Breast reconstruction  2008, 2009, 2010    Family History  Problem Relation Age of Onset  . Heart disease Father   . Lung cancer Father     smoker  . Cirrhosis Sister     Primary Biliary  . Breast cancer Maternal Aunt     x 3  . Stroke Maternal Grandmother   . Alcohol abuse Maternal Grandfather   . Heart disease Paternal Grandfather   .  Anxiety disorder Sister   . Osteoporosis Sister   . Arthritis Sister     Rheumatoid  . Osteoporosis Sister   . Skin cancer Sister     multiple skin cancers, over 57 excisions.  . Other Mother     tic [place]  . Anesthesia problems Neg Hx   . Hypotension Neg Hx   . Malignant hyperthermia Neg Hx   . Pseudochol deficiency Neg Hx   . Colon cancer Neg Hx   . Ovarian cancer Neg Hx   . Pancreatic cancer Neg Hx   . Breast cancer Paternal Aunt     Social History   Social History  . Marital Status: Married    Spouse Name: N/A  . Number of Children: N/A  . Years of Education: N/A   Occupational History  . Not on file.   Social History Main Topics  . Smoking status: Former Smoker    Types: Cigarettes    Quit date: 05/22/2007  . Smokeless tobacco: Never Used  . Alcohol Use: 0.6 oz/week    1 Glasses of wine per week     Comment: at least 6 oz daily  . Drug Use: No  . Sexual Activity:    Partners: Male     Comment: lives with husband,    Other Topics Concern  . Not on file   Social History Narrative    Outpatient Prescriptions Prior to Visit  Medication Sig Dispense Refill  . amitriptyline (ELAVIL) 150 MG tablet Take 1 tablet (150 mg total) by mouth at bedtime. [place] brand please 90 tablet 1  . aspirin 81 MG tablet Take 81 mg by mouth daily.      [name] atorvastatin (LIPITOR) 40 MG tablet TAKE 1 TABLET EVERY DAY 90 tablet 3  . Cholecalciferol (VITAMIN D3) 1000 UNITS CAPS Take 2 tablets by mouth daily.      . Cyanocobalamin (VITAMIN B 12 PO) Take 1 tablet by mouth daily.    [name] diltiazem (CARDIZEM) 30 MG tablet Take 1 tablet (30 mg total) by mouth daily as needed (for palpitations). 30 tablet 4  . Probiotic Product (PROBIOTIC FORMULA PO) Take 1 capsule by mouth daily.    [name] LORazepam (ATIVAN) 1 MG tablet Take 1 tablet (1 mg total) by mouth every 8 (eight) hours as needed for anxiety. 70 tablet 0   No facility-administered medications prior to visit.    Allergies  Allergen  Reactions  . Codeine Other (See Comments)    "flu like symptoms"  . Morphine Other (See Comments)    "flu like symptoms"  . Peanut-Containing Drug Products Hives  . Sorbitol Other (See Comments)    GI Issues  . Tomato  Diarrhea  . Zofran Other (See Comments)    headache  . Diphenhydramine Hcl Other (See Comments)    "nervous and upset"    Review of Systems  Constitutional: Negative for fever and malaise/fatigue.  HENT: Negative for congestion.   Eyes: Negative for blurred vision.  Respiratory: Negative for shortness of breath.   Cardiovascular: Positive for chest pain. Negative for palpitations and leg swelling.  Gastrointestinal: Negative for nausea, abdominal pain and blood in stool.  Genitourinary: Negative for dysuria and frequency.  Musculoskeletal: Positive for back pain. Negative for falls.  Skin: Negative for rash.  Neurological: Negative for dizziness, loss of consciousness and headaches.  Endo/Heme/Allergies: Negative for environmental allergies.  Psychiatric/Behavioral: Negative for depression. The patient is not nervous/anxious.        Objective:    Physical Exam  Constitutional: She is oriented to person, place, and time. She appears well-developed and well-nourished. No distress.  HENT:  Head: Normocephalic and atraumatic.  Nose: Nose normal.  Eyes: Right eye exhibits no discharge. Left eye exhibits no discharge.  Neck: Normal range of motion. Neck supple.  Cardiovascular: Normal rate and regular rhythm.   No murmur heard. Pulmonary/Chest: Effort normal and breath sounds normal.  Abdominal: Soft. Bowel sounds are normal. There is no tenderness.  Musculoskeletal: She exhibits no edema.  Neurological: She is alert and oriented to person, place, and time.  Skin: Skin is warm and dry.  Psychiatric: She has a normal mood and affect.  Nursing note and vitals reviewed.   BP 126/82 mmHg  Pulse 87  Temp(Src) 98.2 F (36.8 C) (Oral)  Resp 16  Ht 5' 6"  (1.676  m)  Wt 136 lb 12.8 oz (62.052 kg)  BMI 22.09 kg/m2  SpO2 97% Wt Readings from Last 3 Encounters:  05/10/16 136 lb 12.8 oz (62.052 kg)  05/10/16 136 lb 12.8 oz (62.052 kg)  04/20/16 138 lb 12.8 oz (62.959 kg)     Lab Results  Component Value Date   WBC 6.2 05/10/2016   HGB 12.4 05/10/2016   HCT 37.8 05/10/2016   PLT 227 05/10/2016   GLUCOSE 93 03/02/2016   CHOL 175 03/02/2016   TRIG 92.0 03/02/2016   HDL 74.00 03/02/2016   LDLDIRECT 117.4 01/13/2013   LDLCALC 82 03/02/2016   ALT 20 03/02/2016   AST 17 03/02/2016   NA 141 03/02/2016   K 4.0 03/02/2016   CL 102 03/02/2016   CREATININE 0.74 03/02/2016   BUN 15 03/02/2016   CO2 31 03/02/2016   TSH 2.85 03/02/2016   INR 1.0 03/29/2011    Lab Results  Component Value Date   TSH 2.85 03/02/2016   Lab Results  Component Value Date   WBC 6.2 05/10/2016   HGB 12.4 05/10/2016   HCT 37.8 05/10/2016   MCV 94.7 05/10/2016   PLT 227 05/10/2016   Lab Results  Component Value Date   NA 141 03/02/2016   K 4.0 03/02/2016   CHLORIDE 105 05/09/2015   CO2 31 03/02/2016   GLUCOSE 93 03/02/2016   BUN 15 03/02/2016   CREATININE 0.74 03/02/2016   BILITOT 0.5 03/02/2016   ALKPHOS 97 03/02/2016   AST 17 03/02/2016   ALT 20 03/02/2016   PROT 7.3 03/02/2016   ALBUMIN 4.2 03/02/2016   CALCIUM 9.6 03/02/2016   ANIONGAP 8 05/09/2015   EGFR 71* 05/09/2015   GFR 81.66 03/02/2016   Lab Results  Component Value Date   CHOL 175 03/02/2016   Lab Results  Component Value Date  HDL 74.00 03/02/2016   Lab Results  Component Value Date   LDLCALC 82 03/02/2016   Lab Results  Component Value Date   TRIG 92.0 03/02/2016   Lab Results  Component Value Date   CHOLHDL 2 03/02/2016   No results found for: HGBA1C     Assessment & Plan:   Problem List Items Addressed This Visit    GERD    Avoid offending foods, start probiotics. Do not eat large meals in late evening and consider raising head of bed.       Chest wall pain     S/p tripping in her yard and striking the left side of her chest and hip. Pain is improving, CXR is negative for any fractures. Likely pain is related to bad contusion. Encouraged to use topical treatments and given Tramadol prn, she will report any worsening or new symptoms.       Osteopenia determined by x-ray    Encouraged to get adequate exercise, calcium and vitamin d intake      Pain in the chest   Relevant Orders   DG Chest 2 View (Completed)    Other Visit Diagnoses    Chest trauma, initial encounter    -  Primary    Relevant Medications    LORazepam (ATIVAN) 1 MG tablet    Other Relevant Orders    DG Chest 2 View (Completed)       I am having Anne [name] start on traMADol. I am also having her maintain her Vitamin D3, aspirin, Probiotic Product (PROBIOTIC FORMULA PO), diltiazem, amitriptyline, atorvastatin, Cyanocobalamin (VITAMIN B 12 PO), and LORazepam.  Meds ordered this encounter  Medications  . LORazepam (ATIVAN) 1 MG tablet    Sig: Take 1 tablet (1 mg total) by mouth every 8 (eight) hours as needed for anxiety.    Dispense:  70 tablet    Refill:  2  . traMADol (ULTRAM) 50 MG tablet    Sig: Take 1 tablet (50 mg total) by mouth every 12 (twelve) hours as needed.    Dispense:  10 tablet    Refill:  0     Penni Homans, MD

## 2016-05-16 NOTE — Assessment & Plan Note (Signed)
S/p tripping in her yard and striking the left side of her chest and hip. Pain is improving, CXR is negative for any fractures. Likely pain is related to bad contusion. Encouraged to use topical treatments and given Tramadol prn, she will report any worsening or new symptoms.

## 2016-05-17 ENCOUNTER — Telehealth: Payer: Self-pay | Admitting: Family Medicine

## 2016-05-17 ENCOUNTER — Other Ambulatory Visit: Payer: Self-pay | Admitting: Family Medicine

## 2016-05-17 MED ORDER — HYDROCODONE-ACETAMINOPHEN 5-325 MG PO TABS: 1.0000 | ORAL_TABLET | Freq: Three times a day (TID) | ORAL | Status: DC | PRN

## 2016-05-17 NOTE — Telephone Encounter (Signed)
Printed and on counter for signature. Called the patient informed to pickup at the front desk.

## 2016-05-17 NOTE — Telephone Encounter (Signed)
She can have Hydrocodone/APAP 5/325 1 tab po tid prn pain, disp #30, if her pain is worse may need to repeat an xray so check with patient to confirm.

## 2016-05-17 NOTE — Telephone Encounter (Signed)
°  Relationship to patient: Self Can be reached: [phone]     Reason for call: Patient called stating that she is in severe pain and the medication she was given is not helping. Request a stronger pain medication

## 2016-05-18 ENCOUNTER — Telehealth: Payer: Self-pay | Admitting: Family Medicine

## 2016-05-18 ENCOUNTER — Other Ambulatory Visit: Payer: Self-pay | Admitting: Family Medicine

## 2016-05-18 DIAGNOSIS — R0781 Pleurodynia: Secondary | ICD-10-CM

## 2016-05-18 NOTE — Telephone Encounter (Signed)
Patient informed of PCP instructions. The patient would like you to order this asap so she can do over the weekend. I did inform her that she could come on the weekend to have done.

## 2016-05-18 NOTE — Telephone Encounter (Signed)
Yes I can do a dedicated rib xray that looks at things a little differently but no matter what the pain from this lasts several weeks

## 2016-05-18 NOTE — Telephone Encounter (Signed)
I spoke to the husband.  He stated xray did not find anything and was given pain pills, which did help. She has done some yard work and pain has worsened--anterior front below left breast --since yard work needs help getting out of bed and off toilet. The tramadol is gone, but seems like advil helps better than even tramadol.  They are concerned maybe more imaging should be done?? Just not sure.

## 2016-05-18 NOTE — Telephone Encounter (Signed)
Caller name: Dezerae Furtak Relationship to patient: Husband Can be reached: [phone]   Reason for call: Husband request call back to discuss patient's pain and the imaging that was done.

## 2016-05-28 ENCOUNTER — Encounter: Payer: Self-pay | Admitting: Family Medicine

## 2016-05-28 ENCOUNTER — Encounter: Payer: Self-pay | Admitting: Internal Medicine

## 2016-06-11 ENCOUNTER — Telehealth: Payer: Self-pay | Admitting: Family Medicine

## 2016-06-11 ENCOUNTER — Encounter: Payer: Self-pay | Admitting: Family Medicine

## 2016-06-11 ENCOUNTER — Ambulatory Visit (INDEPENDENT_AMBULATORY_CARE_PROVIDER_SITE_OTHER): Payer: Commercial Managed Care - HMO | Admitting: Family Medicine

## 2016-06-11 VITALS — BP 100/60 | HR 92 | Temp 98.3°F | Ht 66.0 in | Wt 137.3 lb

## 2016-06-11 DIAGNOSIS — L03115 Cellulitis of right lower limb: Secondary | ICD-10-CM | POA: Diagnosis not present

## 2016-06-11 DIAGNOSIS — T148 Other injury of unspecified body region: Secondary | ICD-10-CM

## 2016-06-11 DIAGNOSIS — W57XXXA Bitten or stung by nonvenomous insect and other nonvenomous arthropods, initial encounter: Secondary | ICD-10-CM | POA: Diagnosis not present

## 2016-06-11 MED ORDER — CEPHALEXIN 500 MG PO CAPS: 500.0000 mg | ORAL_CAPSULE | Freq: Two times a day (BID) | ORAL | 0 refills | Status: DC

## 2016-06-11 NOTE — Patient Instructions (Addendum)
Start the antibiotic and take as instructed.  Zyrtec daily for 1-2 weeks.  Hydrocortisone cream or the triamcinolone cream for the itchy rash and Band-Aid rash 1-2 times daily.  Follow up with your doctor in 2-3 days or sooner if worsening or other concerns.  It was nice to meet you and I hope that you feel better soon!

## 2016-06-11 NOTE — Progress Notes (Signed)
HPI:  Anne [name] is a pleasant 75 year old here for an acute visit for an insect bite. She reports that something bit her on the right leg about 2 days ago. This was not a tick. She has had 2 itchy papules on the right leg since and has been applying antibiotic ointment and a Band-Aid. She now has a red area near these 2 papules and feels she has some swelling in this area. This area has been itchy and also is a little painful. She also has noticed an itchy rash on both lower shins. She does spend time out working in her yard. She is not aware of any new exposures other then the Band-Aid and topical antibiotic. She denies fevers, malaise, chills, nausea, joint pains or any other symptoms. Her husband thought he saw a blotchy area on her neck, but otherwise she does not have a rash elsewhere.  ROS: See pertinent positives and negatives per HPI.  Past Medical History:  Diagnosis Date  . ALKALINE PHOSPHATASE, ELEVATED 03/15/2009  . Allergic state 06/10/2012  . Anemia   . Anxiety and depression 04/28/2011  . Arthritis   . Atypical chest pain 11/30/2011  . 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 Baker's cyst of knee 05/22/2011  . Cancer ([place]) 01,  08   XRT/chemo 01-02/ lobular invasive ca  . Carotid artery disease ([place])    a. Carotid duplex 03/2014: stable 1-39% BICA, f/u due 03/2016.  [name] Chronic alcoholism in remission ([place]) 03/29/2011   Did not tolerate Klonopin, caused some confusion and bad dreams.    [name] COPD (chronic obstructive pulmonary disease) ([place])   . Depression   . Dermatitis 11/23/2012  . Dysphagia   . EE (eosinophilic esophagitis)   . Elevated sed rate 08/02/2013  . Esophageal ring   . ESOPHAGEAL STRICTURE 03/29/2009  . Fall 11/23/2012  . Folliculitis of nose [id number]  . GERD (gastroesophageal reflux disease) 09/29/2009   improved s/p cholecystectomy and esophagus dilatation  . Hematuria   . History of chicken pox   . History of measles   . History of  shingles    2 episodes  . Hx of echocardiogram    a. Echo 01/2013: mild LVH, EF 55-60%, normal wall motion, Gr 1 diast dysfn  . Hyperlipidemia   . Knee pain, bilateral 07/23/2011  . Medicare annual wellness visit, subsequent 06/19/2015  . Mixed hyperlipidemia 10/17/2010   Qualifier: Diagnosis of  By: Mack Guise    . Orthostasis   . Osteopenia 03/14/2011  . 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 Pneumonia 2-10   pleurisy  . Preventative health care 10/21/2012  . PVC's (premature ventricular contractions)    a. Event monitor 01/2013: NSR, extensive PVCs.  . Radial neck fracture 10/2011   minimally displaced  . Sinusitis, acute 12/04/2015  . Trauma 10/18/2011  . URI (upper respiratory infection) 09/02/2012  . Urinary frequency 12/12/2014  . Urinary incontinence 03/19/2012  . Vaginitis 05/22/2011    Past Surgical History:  Procedure Laterality Date  . ANTERIOR CRUCIATE LIGAMENT REPAIR  08, 09, 10  . BREAST LUMPECTOMY  2001  . BREAST RECONSTRUCTION  2008, 2009, 2010  . CHOLECYSTECTOMY  2010  . COLONOSCOPY  09/05/10   cecal avm's  . ERCP  2010    CBD stone extraction   . ESOPHAGOGASTRODUODENOSCOPY  01/08/2012   Procedure: ESOPHAGOGASTRODUODENOSCOPY (EGD);  Surgeon: Gatha Mayer, MD;  Location: [facility];  Service: Endoscopy;  Laterality: N/A;  . ESOPHAGOGASTRODUODENOSCOPY (EGD)  WITH ESOPHAGEAL DILATION  2010, 2012  . MASTECTOMY MODIFIED RADICAL     , Mastectomy modified radical (08), breast reconstruction, CA lesions excised lateral abd wall 2010  . SAVORY DILATION  01/08/2012   Procedure: SAVORY DILATION;  Surgeon: Gatha Mayer, MD;  Location: [facility];  Service: Endoscopy;  Laterality: N/A;  need xray    Family History  Problem Relation Age of Onset  . Heart disease Father   . Lung cancer Father     smoker  . Cirrhosis Sister     Primary Biliary  . Breast cancer Maternal Aunt     x 3  . Stroke Maternal Grandmother   . Alcohol abuse Maternal Grandfather   . Heart  disease Paternal Grandfather   . Anxiety disorder Sister   . Osteoporosis Sister   . Arthritis Sister     Rheumatoid  . Osteoporosis Sister   . Skin cancer Sister     multiple skin cancers, over 60 excisions.  . Other Mother     tic douloureux  . Anesthesia problems Neg Hx   . Hypotension Neg Hx   . Malignant hyperthermia Neg Hx   . Pseudochol deficiency Neg Hx   . Colon cancer Neg Hx   . Ovarian cancer Neg Hx   . Pancreatic cancer Neg Hx   . Breast cancer Paternal Aunt     Social History   Social History  . Marital status: Married    Spouse name: N/A  . Number of children: N/A  . Years of education: N/A   Social History Main Topics  . Smoking status: Former Smoker    Types: Cigarettes    Quit date: 05/22/2007  . Smokeless tobacco: Never Used  . Alcohol use 0.6 oz/week    1 Glasses of wine per week     Comment: at least 6 oz daily  . Drug use: No  . Sexual activity: Yes    Partners: Male     Comment: lives with husband,    Other Topics Concern  . None   Social History Narrative  . None     Current Outpatient Prescriptions:  .  amitriptyline (ELAVIL) 150 MG tablet, take 1 tablet by mouth at bedtime, Disp: 90 tablet, Rfl: 1 .  aspirin 81 MG tablet, Take 81 mg by mouth daily.  , Disp: , Rfl:  .  atorvastatin (LIPITOR) 40 MG tablet, TAKE 1 TABLET EVERY DAY, Disp: 90 tablet, Rfl: 3 .  Cholecalciferol (VITAMIN D3) 1000 UNITS CAPS, Take 2 tablets by mouth daily.  , Disp: , Rfl:  .  Cyanocobalamin (VITAMIN B 12 PO), Take 1 tablet by mouth daily., Disp: , Rfl:  .  diltiazem (CARDIZEM) 30 MG tablet, Take 1 tablet (30 mg total) by mouth daily as needed (for palpitations)., Disp: 30 tablet, Rfl: 4 .  LORazepam (ATIVAN) 1 MG tablet, Take 1 tablet (1 mg total) by mouth every 8 (eight) hours as needed for anxiety., Disp: 70 tablet, Rfl: 2 .  Probiotic Product (PROBIOTIC FORMULA PO), Take 1 capsule by mouth daily., Disp: , Rfl:  .  cephALEXin (KEFLEX) 500 MG capsule, Take 1  capsule (500 mg total) by mouth 2 (two) times daily., Disp: 10 capsule, Rfl: 0  EXAM:  Vitals:   06/11/16 1632  BP: 100/60  Pulse: 92  Temp: 98.3 F (36.8 C)    Body mass index is 22.16 kg/m.  GENERAL: vitals reviewed and listed above, alert, oriented, appears well hydrated and in no acute distress  HEENT: atraumatic, conjunttiva clear, no obvious abnormalities on inspection of external nose and ears  NECK: no obvious masses on inspection  LUNGS: clear to auscultation bilaterally, no wheezes, rales or rhonchi, good air movement  CV: HRRR, no peripheral edema  SKIN: 2 small erythematous papules with a central small punctate scab on the R lower anterior leg; these is a perfect rectangle of erythema and edema underlying the bandaid, there is some lighter milder erythema and mild edema surrounding these lesions and encompassing the anterior ankle and lower R ant shin. There is one excoriation above this area with dried blood. I do not appreciate any lymphangitis, abscess or pustulant drainage. There is a patch of fine erythematous papules on the L shin. I do not appreciate any rash on the neck or elsewhere other then a small patch of erythema on the L cheek that they were not concerned about  MS: moves all extremities without noticeable abnormality  PSYCH: pleasant and cooperative, no obvious depression or anxiety  ASSESSMENT AND PLAN:  Discussed the following assessment and plan:  Insect bite  Cellulitis of right lower extremity  I think the findings are mostly reactionary from an insect bite and an allergy to the bandaide; however, we opted to cover with abx in case of mild cellulitis as this is difficult to exclude. Will tx the pruritis and dermatitis with top steroid and zyrtec - they have triam cream at home. I am not sure that the findings on the L shin are related in any way and they look like razor burn or possible contact dermatitis. Follow up later this week to ensure  improving. Patient advised to return or notify a doctor immediately if symptoms worsen or persist or new concerns arise.  Patient Instructions  Start the antibiotic and take as instructed.  Hydrocortisone cream or the triamcinolone cream for the itchy rash and Band-Aid rash 1-2 times daily.  Follow up with your doctor in 2-3 days or sooner if worsening or other concerns.  It was nice to meet you and I hope that you feel better soon!   Colin Benton R., DO

## 2016-06-11 NOTE — Telephone Encounter (Signed)
Appt noted

## 2016-06-11 NOTE — Progress Notes (Signed)
Pre visit review using our clinic review tool, if applicable. No additional management support is needed unless otherwise documented below in the visit note. 

## 2016-06-11 NOTE — Telephone Encounter (Signed)
[facility]  Patient Name: Anne [name]  DOB: [birth year]    Initial Comment Callers wife was bitten by something yesterday. Has a black mark on rt ankle, swelling, redness,    Nurse Assessment  Nurse: Harlow Mares, RN, Suanne Marker Date/Time Eilene Ghazi Time): 06/11/2016 3:11:29 PM  Confirm and document reason for call. If symptomatic, describe symptoms. You must click the next button to save text entered. ---Callers wife was bitten by something yesterday. Has a black mark on rt ankle, swelling, redness, There is a black speck in the center of the bite. Caller reports that he got the black speck out and her foot is proceeding to swell and she has red streaks going up her leg. She also has red blotches on her neck. Denies problems wtih breathing or swallowing.  Has the patient traveled out of the country within the last 30 days? ---Not Applicable  Does the patient have any new or worsening symptoms? ---Yes  Will a triage be completed? ---Yes  Related visit to physician within the last 2 weeks? ---No  Does the PT have any chronic conditions? (i.e. diabetes, asthma, etc.) ---Yes  List chronic conditions. ---, anxiety, a-fib  Is this a behavioral health or substance abuse call? ---No     Guidelines    Guideline Title Affirmed Question Affirmed Notes  Insect Bite [1] Red streak or red line AND [2] length > 2 inches (5 cm)    Final Disposition User   See Physician within 4 Hours (or PCP triage) Harlow Mares, RN, Rhonda    Comments  No appts available at the [place] location. Appt made with Dr. Colin Benton in the [place] location for today at 4:30pm, caller voiced understanding.   Referrals  REFERRED TO PCP OFFICE   Disagree/Comply: Comply

## 2016-06-14 ENCOUNTER — Encounter: Payer: Self-pay | Admitting: Family Medicine

## 2016-06-14 ENCOUNTER — Ambulatory Visit (INDEPENDENT_AMBULATORY_CARE_PROVIDER_SITE_OTHER): Payer: Commercial Managed Care - HMO | Admitting: Family Medicine

## 2016-06-14 VITALS — BP 104/72 | HR 83 | Temp 98.1°F | Ht 66.0 in | Wt 135.8 lb

## 2016-06-14 DIAGNOSIS — W57XXXA Bitten or stung by nonvenomous insect and other nonvenomous arthropods, initial encounter: Secondary | ICD-10-CM

## 2016-06-14 DIAGNOSIS — T148 Other injury of unspecified body region: Secondary | ICD-10-CM

## 2016-06-14 DIAGNOSIS — J029 Acute pharyngitis, unspecified: Secondary | ICD-10-CM | POA: Diagnosis not present

## 2016-06-14 LAB — POCT RAPID STREP A (OFFICE): RAPID STREP A SCREEN: NEGATIVE

## 2016-06-14 NOTE — Progress Notes (Signed)
Pre visit review using our clinic review tool, if applicable. No additional management support is needed unless otherwise documented below in the visit note. 

## 2016-06-14 NOTE — Patient Instructions (Addendum)
I hope that you are feeling better soon.  Let us know if your tick bite does not improve soon Try a benadryl cream on your tick bite and you might use a ludens throat losenge as needed

## 2016-06-14 NOTE — Progress Notes (Signed)
[place] [facility] [address], [place], [place] [address] [place] [id number] [phone]  Date:  06/14/2016   Name:  Anne [name]   DOB:  12/27/1940   MRN:  [id number]  PCP:  Penni Homans, MD    Chief Complaint: Tick Removal (Pt noticed tick bite on inside of right thigh early this morning, husband pulled the tick off. Pt states that she now has some pain in the area.); Sore Throat (c/o discomfort when swallowing that was noticed this morning. ); and Rash (c/o rash on both legs noticed on 7/29 was seen at [facility] by Dr.Kim and was given Cephalexin but pt did not pick up rx due to the rash improving. )   History of Present Illness:  Anne [name] is a 75 y.o. very pleasant female patient who presents with the following:  Pt of Dr. Charlett Blake- she saw Dr. Maudie Mercury on 7/31 for an insect bite on her right leg a swell as itchy rash on her bilateral shins. She was given an rx for keflex, asked to use a steroid cream as needed. She did not end up using the keflex as the rash seems to be getting better.    Here today with concern of another tick bite.  Early this am she noted that her throat was quite sore.  She tried to go back to sleep, but she then noted "pain from a tick bite, it has a distinct pain."  They removed a tick form her right thigh. They could not tell if it was a deer tick or a dog tick, but it was not engorged with blood.   No fever.  She still has the ST and is taking advil but this did not help.   She has not noted any cough, no fever No chills or body aches No vomiting,she did have a little diarrhea last night, gone now She has had dental work over the last week so she is not eating as well as normal but her appetite is ok  She is not able to tolerate doxycycline secondary to a history of esophageal ulcers from using this in the past   Patient Active Problem List   Diagnosis Date Noted  . Pain in the chest 05/16/2016  . Status post  right breast reconstruction 05/11/2016  . 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 History of breast cancer in female 05/11/2016  . Osteopenia determined by x-ray 05/11/2016  . IBS (irritable bowel syndrome) 03/09/2016  . Sinusitis, acute 12/04/2015  . Shortness of breath 06/19/2015  . Medicare annual wellness visit, subsequent 06/19/2015  . Urinary frequency 12/12/2014  . Breast cancer, right breast ([place]) 10/18/2014  . Superficial thrombophlebitis 03/16/2014  . Plant dermatitis 03/16/2014  . Chest wall pain 02/07/2014  . Primary localized osteoarthrosis, lower leg 01/21/2014  . Elevated sed rate 08/02/2013  . Dermatitis 11/23/2012  . Preventative health care 10/21/2012  . Peanut allergy suspected 07/15/2012  . Allergic state 06/10/2012  . Urinary incontinence 03/19/2012  . Anemia 12/21/2011  . Knee pain, bilateral 07/23/2011  . Diarrhea 05/22/2011  . Baker's cyst of knee 05/22/2011  . Hematuria 05/22/2011  . Eosinophilic esophagitis [id number]  . Anxiety and depression 04/28/2011  . Tachycardia 03/29/2011  . Chronic alcoholism in remission ([place]) 03/29/2011  . History of chicken pox   . History of shingles   . History of measles   . Mixed hyperlipidemia 10/17/2010  . CAROTID ARTERY STENOSIS 01/13/2010  .  GERD 09/29/2009  . 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 ESOPHAGEAL STRICTURE 03/29/2009    Past Medical History:  Diagnosis Date  . ALKALINE PHOSPHATASE, ELEVATED 03/15/2009  . Allergic state 06/10/2012  . Anemia   . Anxiety and depression 04/28/2011  . Arthritis   . Atypical chest pain 11/30/2011  . 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 Baker's cyst of knee 05/22/2011  . Cancer ([place]) 01,  08   XRT/chemo 01-02/ lobular invasive ca  . Carotid artery disease ([place])    a. Carotid duplex 03/2014: stable 1-39% BICA, f/u due 03/2016.  [name] Chronic alcoholism in remission ([place]) 03/29/2011   Did not tolerate Klonopin, caused some confusion and  bad dreams.    [name] COPD (chronic obstructive pulmonary disease) ([place])   . Depression   . Dermatitis 11/23/2012  . Dysphagia   . EE (eosinophilic esophagitis)   . Elevated sed rate 08/02/2013  . Esophageal ring   . ESOPHAGEAL STRICTURE 03/29/2009  . Fall 11/23/2012  . Folliculitis of nose [id number]  . GERD (gastroesophageal reflux disease) 09/29/2009   improved s/p cholecystectomy and esophagus dilatation  . Hematuria   . History of chicken pox   . History of measles   . History of shingles    2 episodes  . Hx of echocardiogram    a. Echo 01/2013: mild LVH, EF 55-60%, normal wall motion, Gr 1 diast dysfn  . Hyperlipidemia   . Knee pain, bilateral 07/23/2011  . Medicare annual wellness visit, subsequent 06/19/2015  . Mixed hyperlipidemia 10/17/2010   Qualifier: Diagnosis of  By: Mack Guise    . Orthostasis   . Osteopenia 03/14/2011  . 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 Pneumonia 2-10   pleurisy  . Preventative health care 10/21/2012  . PVC's (premature ventricular contractions)    a. Event monitor 01/2013: NSR, extensive PVCs.  . Radial neck fracture 10/2011   minimally displaced  . Sinusitis, acute 12/04/2015  . Trauma 10/18/2011  . URI (upper respiratory infection) 09/02/2012  . Urinary frequency 12/12/2014  . Urinary incontinence 03/19/2012  . Vaginitis 05/22/2011    Past Surgical History:  Procedure Laterality Date  . ANTERIOR CRUCIATE LIGAMENT REPAIR  08, 09, 10  . BREAST LUMPECTOMY  2001  . BREAST RECONSTRUCTION  2008, 2009, 2010  . CHOLECYSTECTOMY  2010  . COLONOSCOPY  09/05/10   cecal avm's  . ERCP  2010    CBD stone extraction   . ESOPHAGOGASTRODUODENOSCOPY  01/08/2012   Procedure: ESOPHAGOGASTRODUODENOSCOPY (EGD);  Surgeon: Gatha Mayer, MD;  Location: [facility];  Service: Endoscopy;  Laterality: N/A;  . ESOPHAGOGASTRODUODENOSCOPY (EGD) WITH ESOPHAGEAL DILATION  2010, 2012  . MASTECTOMY MODIFIED RADICAL     , Mastectomy modified radical (08), breast  reconstruction, CA lesions excised lateral abd wall 2010  . SAVORY DILATION  01/08/2012   Procedure: SAVORY DILATION;  Surgeon: Gatha Mayer, MD;  Location: [facility];  Service: Endoscopy;  Laterality: N/A;  need xray    Social History  Substance Use Topics  . Smoking status: Former Smoker    Types: Cigarettes    Quit date: 05/22/2007  . Smokeless tobacco: Never Used  . Alcohol use 0.6 oz/week    1 Glasses of wine per week     Comment: at least 6 oz daily    Family History  Problem Relation Age of Onset  . Heart disease Father   . Lung cancer Father     smoker  . Cirrhosis  Sister     Primary Biliary  . Breast cancer Maternal Aunt     x 3  . Stroke Maternal Grandmother   . Alcohol abuse Maternal Grandfather   . Heart disease Paternal Grandfather   . Anxiety disorder Sister   . Osteoporosis Sister   . Arthritis Sister     Rheumatoid  . Osteoporosis Sister   . Skin cancer Sister     multiple skin cancers, over 3 excisions.  . Other Mother     tic douloureux  . Anesthesia problems Neg Hx   . Hypotension Neg Hx   . Malignant hyperthermia Neg Hx   . Pseudochol deficiency Neg Hx   . Colon cancer Neg Hx   . Ovarian cancer Neg Hx   . Pancreatic cancer Neg Hx   . Breast cancer Paternal Aunt     Allergies  Allergen Reactions  . Codeine Other (See Comments)    "flu like symptoms"  . Morphine Other (See Comments)    "flu like symptoms"  . Peanut-Containing Drug Products Hives  . Sorbitol Other (See Comments)    GI Issues  . Tomato Diarrhea  . Zofran Other (See Comments)    headache  . Diphenhydramine Hcl Other (See Comments)    "nervous and upset"    Medication list has been reviewed and updated.  Current Outpatient Prescriptions on File Prior to Visit  Medication Sig Dispense Refill  . amitriptyline (ELAVIL) 150 MG tablet take 1 tablet by mouth at bedtime 90 tablet 1  . aspirin 81 MG tablet Take 81 mg by mouth daily.      [name] atorvastatin (LIPITOR) 40 MG  tablet TAKE 1 TABLET EVERY DAY 90 tablet 3  . cephALEXin (KEFLEX) 500 MG capsule Take 1 capsule (500 mg total) by mouth 2 (two) times daily. 10 capsule 0  . Cholecalciferol (VITAMIN D3) 1000 UNITS CAPS Take 2 tablets by mouth daily.      . Cyanocobalamin (VITAMIN B 12 PO) Take 1 tablet by mouth daily.    [name] diltiazem (CARDIZEM) 30 MG tablet Take 1 tablet (30 mg total) by mouth daily as needed (for palpitations). 30 tablet 4  . LORazepam (ATIVAN) 1 MG tablet Take 1 tablet (1 mg total) by mouth every 8 (eight) hours as needed for anxiety. 70 tablet 2  . Probiotic Product (PROBIOTIC FORMULA PO) Take 1 capsule by mouth daily.     No current facility-administered medications on file prior to visit.     Review of Systems:  As per HPI- otherwise negative.   Physical Examination: Vitals:   06/14/16 1618  BP: 104/72  Pulse: 83  Temp: 98.1 F (36.7 C)    Ideal Body Weight:    GEN: WDWN, NAD, Non-toxic, A & O x 3, looks well, accompanied by her husband HEENT: Atraumatic, Normocephalic. Neck supple. No masses, No LAD.  Bilateral TM wnl, oropharynx normal.  PEERL,EOMI.   Ears and Nose: No external deformity. CV: RRR, No M/G/R. No JVD. No thrill. No extra heart sounds. PULM: CTA B, no wheezes, crackles, rhonchi. No retractions. No resp. distress. No accessory muscle use. ABD: S, NT, ND She has an insect bite on the right inner thigh that is slightly inflamed.   Resolving contact derm rash on her bilateral anterior ankles.  May be due to PI EXTR: No c/c/e NEURO Normal gait.  PSYCH: Normally interactive. Conversant. Not depressed or anxious appearing.  Calm demeanor.   Results for orders placed or performed in visit on 06/14/16  POCT rapid strep A  Result Value Ref Range   Rapid Strep A Screen Negative Negative     Assessment and Plan: Acute pharyngitis, unspecified etiology - Plan: POCT rapid strep A  Tick bite  Here today because she noted a sore throat this morning and also  pulled a tick of her leg this am.  Her rapid strep is negative.  She asks what can be done for her ST but states that she will not use a throat spray or lozenge "because the taste is so bad it's worse than the sore throat."  Her husband will try and help her find a good tasting throat drop Reassured that most tick bites do not result in any serious illness.  She cannot take tablet/ capsule doxycycline so will defer any treatment for now.  If she develops any symptoms will try to find liquid doxy for her   Signed Lamar Blinks, MD

## 2016-06-16 ENCOUNTER — Encounter: Payer: Self-pay | Admitting: Family Medicine

## 2016-06-18 MED ORDER — CEPHALEXIN 500 MG PO CAPS: 500.0000 mg | ORAL_CAPSULE | Freq: Three times a day (TID) | ORAL | 0 refills | Status: DC

## 2016-06-19 DIAGNOSIS — Z923 Personal history of irradiation: Secondary | ICD-10-CM | POA: Diagnosis not present

## 2016-06-19 DIAGNOSIS — N651 Disproportion of reconstructed breast: Secondary | ICD-10-CM | POA: Diagnosis not present

## 2016-06-19 DIAGNOSIS — M8580 Other specified disorders of bone density and structure, unspecified site: Secondary | ICD-10-CM | POA: Diagnosis not present

## 2016-06-19 DIAGNOSIS — Z9011 Acquired absence of right breast and nipple: Secondary | ICD-10-CM | POA: Diagnosis not present

## 2016-06-19 DIAGNOSIS — Z853 Personal history of malignant neoplasm of breast: Secondary | ICD-10-CM | POA: Diagnosis not present

## 2016-06-19 DIAGNOSIS — N650 Deformity of reconstructed breast: Secondary | ICD-10-CM | POA: Diagnosis not present

## 2016-06-24 ENCOUNTER — Encounter: Payer: Self-pay | Admitting: Oncology

## 2016-06-29 NOTE — Telephone Encounter (Signed)
Gave Dr. Marko Plume a printed copy of this My Chart message to review 06-29-16

## 2016-07-20 ENCOUNTER — Encounter: Payer: Self-pay | Admitting: Family Medicine

## 2016-07-22 ENCOUNTER — Other Ambulatory Visit: Payer: Self-pay | Admitting: Family Medicine

## 2016-07-22 DIAGNOSIS — R194 Change in bowel habit: Secondary | ICD-10-CM

## 2016-07-23 ENCOUNTER — Encounter: Payer: Self-pay | Admitting: Family Medicine

## 2016-07-30 ENCOUNTER — Encounter: Payer: Self-pay | Admitting: Family Medicine

## 2016-07-31 ENCOUNTER — Encounter: Payer: Self-pay | Admitting: Family

## 2016-08-01 ENCOUNTER — Encounter: Payer: Self-pay | Admitting: Family

## 2016-08-01 ENCOUNTER — Ambulatory Visit (INDEPENDENT_AMBULATORY_CARE_PROVIDER_SITE_OTHER): Payer: Commercial Managed Care - HMO | Admitting: Family

## 2016-08-01 ENCOUNTER — Telehealth: Payer: Self-pay | Admitting: Family

## 2016-08-01 ENCOUNTER — Ambulatory Visit (HOSPITAL_BASED_OUTPATIENT_CLINIC_OR_DEPARTMENT_OTHER)
Admission: RE | Admit: 2016-08-01 | Discharge: 2016-08-01 | Disposition: A | Discharge status: Home / Self Care | Payer: Commercial Managed Care - HMO | Source: Ambulatory Visit | Attending: Family | Admitting: Family

## 2016-08-01 VITALS — HR 94 | Temp 98.0°F | Resp 16 | Ht 66.0 in | Wt 132.2 lb

## 2016-08-01 DIAGNOSIS — I708 Atherosclerosis of other arteries: Secondary | ICD-10-CM | POA: Insufficient documentation

## 2016-08-01 DIAGNOSIS — R933 Abnormal findings on diagnostic imaging of other parts of digestive tract: Secondary | ICD-10-CM | POA: Diagnosis not present

## 2016-08-01 DIAGNOSIS — K5900 Constipation, unspecified: Secondary | ICD-10-CM

## 2016-08-01 DIAGNOSIS — Z23 Encounter for immunization: Secondary | ICD-10-CM | POA: Diagnosis not present

## 2016-08-01 DIAGNOSIS — I700 Atherosclerosis of aorta: Secondary | ICD-10-CM | POA: Insufficient documentation

## 2016-08-01 MED ORDER — PANTOPRAZOLE SODIUM 40 MG PO TBEC: 40.0000 mg | DELAYED_RELEASE_TABLET | Freq: Every day | ORAL | 3 refills | Status: DC

## 2016-08-01 NOTE — Patient Instructions (Addendum)
Please contact [facility] to schedule follow up with Dr. [name]. [phone] Complete your abdominal x ray on the first floor. We will contact you with your results.

## 2016-08-01 NOTE — Progress Notes (Signed)
Subjective:    Patient ID: Anne [name], female    DOB: 11/21/1940, 75 y.o.   MRN: [id number]  HPI   Anne [name] is a 75 yr old female who presents today with chief complaint of constipation.  She reports that  8/29 is the "last time I had a BM."  She reports that she tried a suppository with small amount of soft stool followed by brown water.  Last Tuesday she was house sitting.  Reports she had discomfort due to bloating. She used suppository and had a small amount of firm stool and felt better. She has tried MOM, dulculax. Dulculax caused abdominal pain.  She reports that her last BM was about 3 days ago and she had watery stool.    She has history of esophageal strictures with dilation. Had episode of severe dysphagia while at [employer].  Went to the bathroom and vomited up a copious amount of saliva. 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    Review of Systems    see HPI  Past Medical History:  Diagnosis Date  . ALKALINE PHOSPHATASE, ELEVATED 03/15/2009  . Allergic state 06/10/2012  . Anemia   . Anxiety and depression 04/28/2011  . Arthritis   . Atypical chest pain 11/30/2011  . 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 Baker's cyst of knee 05/22/2011  . 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 Carotid artery disease ([place])    a. Carotid duplex 03/2014: stable 1-39% BICA, f/u due 03/2016.  [name] Chronic alcoholism in remission ([place]) 03/29/2011   Did not tolerate Klonopin, caused some confusion and bad dreams.    [name] COPD (chronic obstructive pulmonary disease) ([place])   . Depression   . Dermatitis 11/23/2012  . Dysphagia   . EE (eosinophilic esophagitis)   . Elevated sed rate 08/02/2013  . Esophageal ring   . ESOPHAGEAL STRICTURE 03/29/2009  . Fall 11/23/2012  . Folliculitis of nose [id number]  . GERD (gastroesophageal reflux disease) 09/29/2009   improved s/p cholecystectomy and esophagus dilatation  . Hematuria   . History of chicken pox   . History of  measles   . History of shingles    2 episodes  . Hx of echocardiogram    a. Echo 01/2013: mild LVH, EF 55-60%, normal wall motion, Gr 1 diast dysfn  . Hyperlipidemia   . Knee pain, bilateral 07/23/2011  . Medicare annual wellness visit, subsequent 06/19/2015  . Mixed hyperlipidemia 10/17/2010   Qualifier: Diagnosis of  By: Mack Guise    . Orthostasis   . Osteopenia 03/14/2011  . 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 Pneumonia 2-10   pleurisy  . Preventative health care 10/21/2012  . PVC's (premature ventricular contractions)    a. Event monitor 01/2013: NSR, extensive PVCs.  . Radial neck fracture 10/2011   minimally displaced  . Sinusitis, acute 12/04/2015  . Trauma 10/18/2011  . URI (upper respiratory infection) 09/02/2012  . Urinary frequency 12/12/2014  . Urinary incontinence 03/19/2012  . Vaginitis 05/22/2011     Social History   Social History  . Marital status: Married    Spouse name: N/A  . Number of children: N/A  . Years of education: N/A   Occupational History  . Not on file.   Social History Main Topics  . Smoking status: Former Smoker    Types: Cigarettes    Quit date: 05/22/2007  . Smokeless tobacco: Never Used  . Alcohol use  0.6 oz/week    1 Glasses of wine per week     Comment: at least 6 oz daily  . Drug use: No  . Sexual activity: Yes    Partners: Male     Comment: lives with husband,    Other Topics Concern  . Not on file   Social History Narrative  . No narrative on file    Past Surgical History:  Procedure Laterality Date  . ANTERIOR CRUCIATE LIGAMENT REPAIR  08, 09, 10  . BREAST LUMPECTOMY  2001  . BREAST RECONSTRUCTION  2008, 2009, 2010  . CHOLECYSTECTOMY  2010  . COLONOSCOPY  09/05/10   [facility]  . ERCP  2010    CBD stone extraction   . ESOPHAGOGASTRODUODENOSCOPY  01/08/2012   Procedure: ESOPHAGOGASTRODUODENOSCOPY (EGD);  Surgeon: Gatha Mayer, MD;  Location: [facility];  Service: Endoscopy;  Laterality: N/A;  .  ESOPHAGOGASTRODUODENOSCOPY (EGD) WITH ESOPHAGEAL DILATION  2010, 2012  . MASTECTOMY MODIFIED RADICAL     , Mastectomy modified radical (08), breast reconstruction, CA lesions excised lateral abd wall 2010  . SAVORY DILATION  01/08/2012   Procedure: SAVORY DILATION;  Surgeon: Gatha Mayer, MD;  Location: [facility];  Service: Endoscopy;  Laterality: N/A;  need xray    Family History  Problem Relation Age of Onset  . Heart disease Father   . Lung cancer Father     smoker  . Cirrhosis Sister     Primary Biliary  . Stroke Maternal Grandmother   . Alcohol abuse Maternal Grandfather   . Heart disease Paternal Grandfather   . Anxiety disorder Sister   . Osteoporosis Sister   . Arthritis Sister     Rheumatoid  . Osteoporosis Sister   . Skin cancer Sister     multiple skin cancers, over 61 excisions.  . Other Mother     tic [place]  . Breast cancer Maternal Aunt     x 3  . Breast cancer Paternal Aunt   . Anesthesia problems Neg Hx   . Hypotension Neg Hx   . Malignant hyperthermia Neg Hx   . Pseudochol deficiency Neg Hx   . Colon cancer Neg Hx   . Ovarian cancer Neg Hx   . Pancreatic cancer Neg Hx     Allergies  Allergen Reactions  . Codeine Other (See Comments)    "flu like symptoms"  . Morphine Other (See Comments)    "flu like symptoms"  . Peanut-Containing Drug Products Hives  . Sorbitol Other (See Comments)    GI Issues  . Tomato Diarrhea  . Zofran Other (See Comments)    headache  . Diphenhydramine Hcl Other (See Comments)    "nervous and upset"    Current Outpatient Prescriptions on File Prior to Visit  Medication Sig Dispense Refill  . amitriptyline (ELAVIL) 150 MG tablet take 1 tablet by mouth at bedtime 90 tablet 1  . aspirin 81 MG tablet Take 81 mg by mouth daily.      [name] atorvastatin (LIPITOR) 40 MG tablet TAKE 1 TABLET EVERY DAY 90 tablet 3  . Cholecalciferol (VITAMIN D3) 1000 UNITS CAPS Take 2 tablets by mouth daily.      . Cyanocobalamin (VITAMIN  B 12 PO) Take 1 tablet by mouth daily.    [name] diltiazem (CARDIZEM) 30 MG tablet Take 1 tablet (30 mg total) by mouth daily as needed (for palpitations). 30 tablet 4  . LORazepam (ATIVAN) 1 MG tablet Take 1 tablet (1 mg total) by  mouth every 8 (eight) hours as needed for anxiety. 70 tablet 2  . Probiotic Product (PROBIOTIC FORMULA PO) Take 1 capsule by mouth daily.     No current facility-administered medications on file prior to visit.     Pulse 94   Temp 98 F (36.7 C) (Oral)   Resp 16   Ht 5\' 6"  (1.676 m)   Wt 132 lb 3.2 oz (60 kg)   SpO2 98% Comment: room air  BMI 21.34 kg/m    Objective:   Physical Exam  Constitutional: She is oriented to person, place, and time. She appears well-developed and well-nourished.  HENT:  Head: Normocephalic and atraumatic.  Cardiovascular: Normal rate, regular rhythm and normal heart sounds.   No murmur heard. Pulmonary/Chest: Effort normal and breath sounds normal. No respiratory distress. She has no wheezes.  Abdominal: Soft. Bowel sounds are normal. She exhibits no distension. There is no tenderness. There is no rebound.  Musculoskeletal: She exhibits no edema.  Neurological: She is alert and oriented to person, place, and time.  Skin: Skin is warm and dry.  Psychiatric: She has a normal mood and affect. Her behavior is normal. Judgment and thought content normal.          Assessment & Plan:  Constipation- KUB is performed and notes the following:  Moderate stool throughout the colon.  Note is also made of several nonspecific air filled loops of small bowel.  Clinically she is non-tender and tolerating PO's.  She is advised as follows for her constipation:  Advised pt to take dose of miralax tonight along with a fleets enema. May repeat miralax tomorrow if needed. Call if she has not had a good BM in 24 hours. She is instructed to go to the ER if new/worsening abdominal pain or if she develops fever.  Pt verbalizes understanding.     Dysphagia- Pt is establishes with GI. She is instructed to call [facility] to arrange follow up with Dr. [name].  A referral was made last visit but she did not return their call.  Add a PPI in the meantimes.

## 2016-08-01 NOTE — Telephone Encounter (Signed)
Reviewed KUB results.  KUB Notes significant constipation. Clinically did not present like enteritis, (non-tender on exam). Advised pt to take dose of miralax tonight along with a fleets enema. May repeat miralax tomorrow if needed. Call if she has not had a good BM in 24 hours. She is instructed to go to the ER if new/worsening abdominal pain or if she develops fever.  Pt verbalizes understanding.

## 2016-08-01 NOTE — Progress Notes (Signed)
Pre visit review using our clinic review tool, if applicable. No additional management support is needed unless otherwise documented below in the visit note. 

## 2016-08-03 ENCOUNTER — Telehealth: Payer: Self-pay | Admitting: Family Medicine

## 2016-08-03 ENCOUNTER — Emergency Department (HOSPITAL_BASED_OUTPATIENT_CLINIC_OR_DEPARTMENT_OTHER): Payer: Commercial Managed Care - HMO

## 2016-08-03 ENCOUNTER — Observation Stay (HOSPITAL_BASED_OUTPATIENT_CLINIC_OR_DEPARTMENT_OTHER)
Admission: EM | Admit: 2016-08-03 | Discharge: 2016-08-04 | Disposition: A | Discharge status: Home / Self Care | Payer: Commercial Managed Care - HMO | Attending: Surgery | Admitting: Surgery

## 2016-08-03 ENCOUNTER — Encounter (HOSPITAL_BASED_OUTPATIENT_CLINIC_OR_DEPARTMENT_OTHER): Payer: Self-pay

## 2016-08-03 DIAGNOSIS — J449 Chronic obstructive pulmonary disease, unspecified: Secondary | ICD-10-CM | POA: Insufficient documentation

## 2016-08-03 DIAGNOSIS — F32A Depression, unspecified: Secondary | ICD-10-CM | POA: Diagnosis present

## 2016-08-03 DIAGNOSIS — F1021 Alcohol dependence, in remission: Secondary | ICD-10-CM

## 2016-08-03 DIAGNOSIS — Z853 Personal history of malignant neoplasm of breast: Secondary | ICD-10-CM | POA: Insufficient documentation

## 2016-08-03 DIAGNOSIS — R100 Acute abdomen: Secondary | ICD-10-CM | POA: Diagnosis not present

## 2016-08-03 DIAGNOSIS — K5900 Constipation, unspecified: Secondary | ICD-10-CM | POA: Diagnosis present

## 2016-08-03 DIAGNOSIS — Z7982 Long term (current) use of aspirin: Secondary | ICD-10-CM | POA: Insufficient documentation

## 2016-08-03 DIAGNOSIS — F419 Anxiety disorder, unspecified: Secondary | ICD-10-CM | POA: Diagnosis not present

## 2016-08-03 DIAGNOSIS — K5792 Diverticulitis of intestine, part unspecified, without perforation or abscess without bleeding: Secondary | ICD-10-CM | POA: Diagnosis not present

## 2016-08-03 DIAGNOSIS — D72829 Elevated white blood cell count, unspecified: Secondary | ICD-10-CM

## 2016-08-03 DIAGNOSIS — Z87891 Personal history of nicotine dependence: Secondary | ICD-10-CM | POA: Insufficient documentation

## 2016-08-03 DIAGNOSIS — I739 Peripheral vascular disease, unspecified: Secondary | ICD-10-CM | POA: Insufficient documentation

## 2016-08-03 DIAGNOSIS — E782 Mixed hyperlipidemia: Secondary | ICD-10-CM | POA: Diagnosis not present

## 2016-08-03 DIAGNOSIS — K3580 Unspecified acute appendicitis: Secondary | ICD-10-CM | POA: Diagnosis not present

## 2016-08-03 DIAGNOSIS — K353 Acute appendicitis with localized peritonitis, without perforation or gangrene: Secondary | ICD-10-CM

## 2016-08-03 DIAGNOSIS — K219 Gastro-esophageal reflux disease without esophagitis: Secondary | ICD-10-CM | POA: Insufficient documentation

## 2016-08-03 DIAGNOSIS — Z79899 Other long term (current) drug therapy: Secondary | ICD-10-CM | POA: Diagnosis not present

## 2016-08-03 DIAGNOSIS — F329 Major depressive disorder, single episode, unspecified: Secondary | ICD-10-CM | POA: Diagnosis present

## 2016-08-03 LAB — BASIC METABOLIC PANEL
Anion gap: 6 (ref 5–15)
BUN: 15 mg/dL (ref 6–20)
CALCIUM: 9.2 mg/dL (ref 8.9–10.3)
CHLORIDE: 104 mmol/L (ref 101–111)
CO2: 28 mmol/L (ref 22–32)
Creatinine, Ser: 0.63 mg/dL (ref 0.44–1.00)
GFR calc Af Amer: >60 mL/min (ref >60)
GLUCOSE: 97 mg/dL (ref 65–99)
POTASSIUM: 3.9 mmol/L (ref 3.5–5.1)
SODIUM: 138 mmol/L (ref 135–145)

## 2016-08-03 LAB — CBC WITH DIFFERENTIAL/PLATELET
Basophils Absolute: 0 10*3/uL (ref 0.0–0.1)
Basophils Relative: 0 %
EOS ABS: 0.1 10*3/uL (ref 0.0–0.7)
EOS PCT: 1 %
HCT: 37.6 % (ref 36.0–46.0)
HEMOGLOBIN: 12.1 g/dL (ref 12.0–15.0)
LYMPHS ABS: 1.4 10*3/uL (ref 0.7–4.0)
LYMPHS PCT: 13 %
MCH: 31.4 pg (ref 26.0–34.0)
MCHC: 32.2 g/dL (ref 30.0–36.0)
MCV: 97.7 fL (ref 78.0–100.0)
MONOS PCT: 6 %
Monocytes Absolute: 0.7 10*3/uL (ref 0.1–1.0)
Neutro Abs: 8.8 10*3/uL — ABNORMAL HIGH (ref 1.7–7.7)
Neutrophils Relative %: 80 %
PLATELETS: 250 10*3/uL (ref 150–400)
RBC: 3.85 MIL/uL — ABNORMAL LOW (ref 3.87–5.11)
RDW: 14.2 % (ref 11.5–15.5)
WBC: 11 10*3/uL — ABNORMAL HIGH (ref 4.0–10.5)

## 2016-08-03 MED ORDER — PHENOL 1.4 % MT LIQD
2.0000 | OROMUCOSAL | Status: DC | PRN
  Filled 2016-08-03: qty 177

## 2016-08-03 MED ORDER — LACTATED RINGERS IV BOLUS (SEPSIS): 1000.0000 mL | Freq: Once | INTRAVENOUS | Status: DC

## 2016-08-03 MED ORDER — PANTOPRAZOLE SODIUM 40 MG PO TBEC: 40.0000 mg | DELAYED_RELEASE_TABLET | Freq: Every day | ORAL | Status: DC

## 2016-08-03 MED ORDER — IOPAMIDOL (ISOVUE-300) INJECTION 61%
100.0000 mL | Freq: Once | INTRAVENOUS | Status: AC | PRN
  Administered 2016-08-03: 100 mL via INTRAVENOUS

## 2016-08-03 MED ORDER — ACETAMINOPHEN 500 MG PO TABS
1000.0000 mg | ORAL_TABLET | Freq: Once | ORAL | Status: DC
  Filled 2016-08-03: qty 2

## 2016-08-03 MED ORDER — AMITRIPTYLINE HCL 50 MG PO TABS
150.0000 mg | ORAL_TABLET | Freq: Every day | ORAL | Status: DC
  Administered 2016-08-04: 150 mg via ORAL
  Filled 2016-08-03 (×3): qty 3

## 2016-08-03 MED ORDER — LACTATED RINGERS IV SOLN: INTRAVENOUS | Status: DC

## 2016-08-03 MED ORDER — LORAZEPAM 1 MG PO TABS
1.0000 mg | ORAL_TABLET | Freq: Three times a day (TID) | ORAL | Status: DC | PRN
  Administered 2016-08-04: 1 mg via ORAL
  Filled 2016-08-03: qty 1

## 2016-08-03 MED ORDER — PROCHLORPERAZINE EDISYLATE 5 MG/ML IJ SOLN: 5.0000 mg | INTRAMUSCULAR | Status: DC | PRN

## 2016-08-03 MED ORDER — DILTIAZEM HCL 30 MG PO TABS
30.0000 mg | ORAL_TABLET | Freq: Every day | ORAL | Status: DC | PRN
  Filled 2016-08-03: qty 1

## 2016-08-03 MED ORDER — CEFOXITIN SODIUM 1 G IV SOLR
INTRAVENOUS | Status: AC
  Filled 2016-08-03: qty 1

## 2016-08-03 MED ORDER — DIPHENHYDRAMINE HCL 50 MG/ML IJ SOLN: 12.5000 mg | Freq: Four times a day (QID) | INTRAMUSCULAR | Status: DC | PRN

## 2016-08-03 MED ORDER — METOPROLOL TARTRATE 5 MG/5ML IV SOLN: 5.0000 mg | Freq: Four times a day (QID) | INTRAVENOUS | Status: DC | PRN

## 2016-08-03 MED ORDER — FENTANYL CITRATE (PF) 100 MCG/2ML IJ SOLN
50.0000 ug | Freq: Once | INTRAMUSCULAR | Status: AC
  Administered 2016-08-03: 50 ug via INTRAVENOUS
  Filled 2016-08-03: qty 2

## 2016-08-03 MED ORDER — ONDANSETRON HCL 4 MG/2ML IJ SOLN
4.0000 mg | Freq: Four times a day (QID) | INTRAMUSCULAR | Status: DC | PRN
Start: 2016-08-03 — End: 2016-08-04

## 2016-08-03 MED ORDER — MAGIC MOUTHWASH
15.0000 mL | Freq: Four times a day (QID) | ORAL | Status: DC | PRN
  Filled 2016-08-03: qty 15

## 2016-08-03 MED ORDER — SODIUM CHLORIDE 0.9 % IV BOLUS (SEPSIS)
500.0000 mL | Freq: Once | INTRAVENOUS | Status: AC
  Administered 2016-08-03: 500 mL via INTRAVENOUS

## 2016-08-03 MED ORDER — DEXTROSE 5 % IV SOLN
1.0000 g | Freq: Once | INTRAVENOUS | Status: AC
  Administered 2016-08-03: 1 g via INTRAVENOUS

## 2016-08-03 MED ORDER — LIP MEDEX EX OINT
1.0000 "application " | TOPICAL_OINTMENT | Freq: Two times a day (BID) | CUTANEOUS | Status: DC
  Filled 2016-08-03: qty 7

## 2016-08-03 MED ORDER — ATORVASTATIN CALCIUM 20 MG PO TABS: 40.0000 mg | ORAL_TABLET | Freq: Every day | ORAL | Status: DC

## 2016-08-03 MED ORDER — RISAQUAD PO CAPS: ORAL_CAPSULE | Freq: Every day | ORAL | Status: DC

## 2016-08-03 MED ORDER — SODIUM CHLORIDE 0.9 % IV SOLN
8.0000 mg | Freq: Four times a day (QID) | INTRAVENOUS | Status: DC | PRN
  Filled 2016-08-03: qty 4

## 2016-08-03 MED ORDER — ACETAMINOPHEN 500 MG PO TABS
1000.0000 mg | ORAL_TABLET | Freq: Once | ORAL | Status: AC
  Administered 2016-08-03: 1000 mg via ORAL
  Filled 2016-08-03: qty 2

## 2016-08-03 MED ORDER — MENTHOL 3 MG MT LOZG
1.0000 | LOZENGE | OROMUCOSAL | Status: DC | PRN
  Filled 2016-08-03: qty 9

## 2016-08-03 MED ORDER — ALUM & MAG HYDROXIDE-SIMETH 200-200-20 MG/5ML PO SUSP: 30.0000 mL | Freq: Four times a day (QID) | ORAL | Status: DC | PRN

## 2016-08-03 MED ORDER — SODIUM CHLORIDE 0.9 % IV SOLN
Freq: Once | INTRAVENOUS | Status: AC
  Administered 2016-08-03: 1000 mL via INTRAVENOUS

## 2016-08-03 MED ORDER — LACTATED RINGERS IV BOLUS (SEPSIS): 1000.0000 mL | Freq: Three times a day (TID) | INTRAVENOUS | Status: DC | PRN

## 2016-08-03 MED ORDER — ASPIRIN 81 MG PO CHEW: 81.0000 mg | CHEWABLE_TABLET | Freq: Every day | ORAL | Status: DC

## 2016-08-03 NOTE — ED Notes (Signed)
Patient reported taking tylenol about 3 hours prior to now. Spoke with MD hold tylenol for now.

## 2016-08-03 NOTE — H&P (Signed)
[facility]  [address] [place], [place] [address] Phone: [phone] FAX: [place]  Aug 08, 1941 [id number]  CARE TEAM:  PCP: Penni Homans, MD  Outpatient Care Team: Patient Care Team: [name], MD as PCP - General (Family Medicine) Gentry Fitz, MD as Consulting Physician (Family Medicine) Amada Kingfisher, MD (Inactive) as Consulting Physician (Hematology and Oncology) Paralee Cancel, MD as Consulting Physician (Orthopedic Surgery) Fay Records, MD as Consulting Physician (Cardiology) Gordy Levan, MD as Consulting Physician (Oncology)  Inpatient Treatment Team: Treatment Team: Technician: Elwanda Brooklyn, NT; Registered Nurse: Moss Mc, RN  This patient is a 75 y.o.female who presents today for surgical evaluation at the request of Dr Addison Lank, ED MD [facility]  Reason for evaluation: Appendicitis  75 year old woman with history of what sounds like irritable bowel symptoms with constipation whose had history of chronic abdominal pain and back pain.  Anxiety.  Insomnia.  She noted more constipation over the past months.  Was more bothersome.  Went to family practice office three days ago.  Films concerning for retained stool burden.  Recommendation of starting MiraLAX and using enemas.  Patient felt more intense on the right side of her abdomen today.  It was recommended she go to emergency room.  There is concern of perhaps some fecal impaction.  However digital exam was negative.  Seem to have more abdominal pain than expected in the lower abdomen.  CT scan ordered.  Concerning for early but definite appendicitis.  Surgical consultation requested.  I recommended the patient be transferred to [facility] in the was a long emergency department care with surgical consultation until to be determined what needs to be done.  I talked to them around 7:45 PM.  I called in around 11:15.  Apparently  the patient had just showed up.  Came in and discussed with the patient and her husband.  He did most of the talking.  She seemed to be quietly fuming in the bed.  Later she got into the conversation  She is very frustrated.  She wants to eat.  She wants to go home.  She wondered to make sure she could get her meds for sleep and anxiety and depression.  She is feeling rather angry & withdrawn.  I tried to calm her down.  Patient's had somewhat decreased appetite and mild nausea but nothing intense.  No emesis.  No personal nor family history of GI/colon cancer, inflammatory bowel disease, irritable bowel syndrome, allergy such as Celiac Sprue, dietary/dairy problems, colitis, ulcers nor gastritis.  No recent sick contacts/gastroenteritis.  No travel outside the country.  No changes in diet.  No dysphagia to solids or liquids.  No significant heartburn or reflux.  No hematochezia, hematemesis, coffee ground emesis.  No evidence of prior gastric/peptic ulceration.  She had a cholecystectomy by her group about seven years ago.  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  No major dysphagia issues since.  Had some mild AVMs in the cecum and redundant colon but otherwise no major abnormalities in 2011 colonoscopy.      Past Medical History:  Diagnosis Date  . ALKALINE PHOSPHATASE, ELEVATED 03/15/2009  . Allergic state 06/10/2012  . Anemia   . Anxiety and depression 04/28/2011  . Arthritis   . Atypical chest pain 11/30/2011  . 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 Baker's cyst  of knee 05/22/2011  . Cancer ([place]) 01,  08   XRT/chemo 01-02/ lobular invasive ca  . Carotid artery disease ([place])    a. Carotid duplex 03/2014: stable 1-39% BICA, f/u due 03/2016.  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    [name] COPD (chronic obstructive pulmonary disease) ([place])   . Depression   .  Dermatitis 11/23/2012  . Dysphagia   . EE (eosinophilic esophagitis)   . Elevated sed rate 08/02/2013  . Esophageal ring   . ESOPHAGEAL STRICTURE 03/29/2009  . Fall 11/23/2012  . Folliculitis of nose 05/11/1600  . GERD (gastroesophageal reflux disease) 09/29/2009   improved s/p cholecystectomy and esophagus dilatation  . Hematuria   . History of chicken pox   . History of measles   . History of shingles    2 episodes  . Hx of echocardiogram    a. Echo 01/2013: mild LVH, EF 55-60%, normal wall motion, Gr 1 diast dysfn  . Hyperlipidemia   . Knee pain, bilateral 07/23/2011  . Medicare annual wellness visit, subsequent 06/19/2015  . Mixed hyperlipidemia 10/17/2010   Qualifier: Diagnosis of  By: Mack Guise    . Orthostasis   . Osteopenia 03/14/2011  . 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 Pneumonia 2-10   pleurisy  . Preventative health care 10/21/2012  . PVC's (premature ventricular contractions)    a. Event monitor 01/2013: NSR, extensive PVCs.  . Radial neck fracture 10/2011   minimally displaced  . Sinusitis, acute 12/04/2015  . Trauma 10/18/2011  . URI (upper respiratory infection) 09/02/2012  . Urinary frequency 12/12/2014  . Urinary incontinence 03/19/2012  . Vaginitis 05/22/2011    Past Surgical History:  Procedure Laterality Date  . ANTERIOR CRUCIATE LIGAMENT REPAIR  08, 09, 10  . BREAST LUMPECTOMY  2001  . BREAST RECONSTRUCTION  2008, 2009, 2010  . CHOLECYSTECTOMY  2010  . COLONOSCOPY  09/05/10   cecal avm's  . ERCP  2010    CBD stone extraction   . ESOPHAGOGASTRODUODENOSCOPY  01/08/2012   Procedure: ESOPHAGOGASTRODUODENOSCOPY (EGD);  Surgeon: Gatha Mayer, MD;  Location: [facility];  Service: Endoscopy;  Laterality: N/A;  . ESOPHAGOGASTRODUODENOSCOPY (EGD) WITH ESOPHAGEAL DILATION  2010, 2012  . MASTECTOMY MODIFIED RADICAL     , Mastectomy modified radical (08), breast reconstruction, CA lesions excised lateral abd wall 2010  . SAVORY DILATION  01/08/2012    Procedure: SAVORY DILATION;  Surgeon: Gatha Mayer, MD;  Location: [facility];  Service: Endoscopy;  Laterality: N/A;  need xray    Social History   Social History  . Marital status: Married    Spouse name: N/A  . Number of children: N/A  . Years of education: N/A   Occupational History  . Not on file.   Social History Main Topics  . Smoking status: Former Smoker    Types: Cigarettes    Quit date: 05/22/2007  . Smokeless tobacco: Never Used  . Alcohol use 0.6 oz/week    1 Glasses of wine per week     Comment: at least 6 oz daily  . Drug use: No  . Sexual activity: Yes    Partners: Male     Comment: lives with husband,    Other Topics Concern  . Not on file   Social History Narrative  . No narrative on file    Family History  Problem Relation Age of Onset  . Heart disease Father   .  Lung cancer Father     smoker  . Cirrhosis Sister     Primary Biliary  . Stroke Maternal Grandmother   . Alcohol abuse Maternal Grandfather   . Heart disease Paternal Grandfather   . Anxiety disorder Sister   . Osteoporosis Sister   . Arthritis Sister     Rheumatoid  . Osteoporosis Sister   . Skin cancer Sister     multiple skin cancers, over 76 excisions.  . Other Mother     tic [place]  . Breast cancer Maternal Aunt     x 3  . Breast cancer Paternal Aunt   . Anesthesia problems Neg Hx   . Hypotension Neg Hx   . Malignant hyperthermia Neg Hx   . Pseudochol deficiency Neg Hx   . Colon cancer Neg Hx   . Ovarian cancer Neg Hx   . Pancreatic cancer Neg Hx     Current Facility-Administered Medications  Medication Dose Route Frequency Provider Last Rate Last Dose  . cefOXitin (MEFOXIN) 1 g injection           . alum & mag hydroxide-simeth (MAALOX/MYLANTA) 200-200-20 MG/5ML suspension 30 mL  30 mL Oral Q6H PRN Michael Boston, MD      . amitriptyline (ELAVIL) tablet 150 mg  150 mg Oral QHS Michael Boston, MD      . Derrill Memo ON 08/04/2016] aspirin tablet 81 mg  81 mg Oral Daily  Michael Boston, MD      . Derrill Memo ON 08/04/2016] atorvastatin (LIPITOR) tablet 40 mg  40 mg Oral Daily Michael Boston, MD      . diltiazem (CARDIZEM) tablet 30 mg  30 mg Oral Daily PRN Michael Boston, MD      . diphenhydrAMINE (BENADRYL) injection 12.5-25 mg  12.5-25 mg Intravenous Q6H PRN Michael Boston, MD      . lactated ringers bolus 1,000 mL  1,000 mL Intravenous Once Michael Boston, MD      . lactated ringers bolus 1,000 mL  1,000 mL Intravenous Q8H PRN Michael Boston, MD      . lactated ringers infusion   Intravenous Continuous Michael Boston, MD      . lip balm (CARMEX) ointment 1 application  1 application Topical BID Michael Boston, MD      . LORazepam (ATIVAN) tablet 1 mg  1 mg Oral Q8H PRN Michael Boston, MD      . magic mouthwash  15 mL Oral QID PRN Michael Boston, MD      . menthol-cetylpyridinium (CEPACOL) lozenge 3 mg  1 lozenge Oral PRN Michael Boston, MD      . metoprolol (LOPRESSOR) injection 5 mg  5 mg Intravenous Q6H PRN Michael Boston, MD      . ondansetron [facility]) injection 4 mg  4 mg Intravenous Q6H PRN Michael Boston, MD       Or  . ondansetron (ZOFRAN) 8 mg in sodium chloride 0.9 % 50 mL IVPB  8 mg Intravenous Q6H PRN Michael Boston, MD      . Derrill Memo ON 08/04/2016] pantoprazole (PROTONIX) EC tablet 40 mg  40 mg Oral Daily Michael Boston, MD      . phenol (CHLORASEPTIC) mouth spray 2 spray  2 spray Mouth/Throat PRN Michael Boston, MD      . Derrill Memo ON 08/04/2016] PROBIOTIC FORMULA CAPS   Oral Daily Michael Boston, MD      . prochlorperazine (COMPAZINE) injection 5-10 mg  5-10 mg Intravenous Q4H PRN Michael Boston, MD  Current Outpatient Prescriptions  Medication Sig Dispense Refill  . amitriptyline (ELAVIL) 150 MG tablet take 1 tablet by mouth at bedtime 90 tablet 1  . aspirin 81 MG tablet Take 81 mg by mouth daily.      [name] atorvastatin (LIPITOR) 40 MG tablet TAKE 1 TABLET EVERY DAY 90 tablet 3  . Cholecalciferol (VITAMIN D3) 1000 UNITS CAPS Take 2 tablets by mouth daily.      . Cyanocobalamin  (VITAMIN B 12 PO) Take 1 tablet by mouth daily.    [name] diltiazem (CARDIZEM) 30 MG tablet Take 1 tablet (30 mg total) by mouth daily as needed (for palpitations). 30 tablet 4  . LORazepam (ATIVAN) 1 MG tablet Take 1 tablet (1 mg total) by mouth every 8 (eight) hours as needed for anxiety. 70 tablet 2  . pantoprazole (PROTONIX) 40 MG tablet Take 1 tablet (40 mg total) by mouth daily. 30 tablet 3  . Probiotic Product (PROBIOTIC FORMULA PO) Take 1 capsule by mouth daily.       Allergies  Allergen Reactions  . Codeine Other (See Comments)    "flu like symptoms"  . Morphine Other (See Comments)    "flu like symptoms"  . Peanut-Containing Drug Products Hives  . Sorbitol Other (See Comments)    GI Issues  . Tomato Diarrhea  . Zofran Other (See Comments)    headache  . Diphenhydramine Hcl Other (See Comments)    "nervous and upset"    ROS: Constitutional:  No fevers, chills, sweats.  Weight stable Eyes:  No vision changes, No discharge HENT:  No sore throats, nasal drainage Lymph: No neck swelling, No bruising easily Pulmonary:  No cough, productive sputum CV: No orthopnea, PND  Patient walks without difficulty.  No exertional chest/neck/shoulder/arm pain. GI:  No personal nor family history of GI/colon cancer, inflammatory bowel disease, allergy such as Celiac Sprue, dietary/dairy problems, colitis, ulcers nor gastritis.  No recent sick contacts/gastroenteritis.  No travel outside the country.  No changes in diet. Renal: No UTIs, No hematuria Genital:  No drainage, bleeding, masses Musculoskeletal: No severe joint pain.  Mild back and hip pain. Good ROM major joints Skin:  No sores or lesions.  No rashes Heme/Lymph:  No easy bleeding.  No swollen lymph nodes Neuro: No focal weakness/numbness.  No seizures Psych: No suicidal ideation.  No hallucinations  BP 133/91 (BP Location: Left Arm)   Pulse 86   Temp 98.3 F (36.8 C) (Oral)   Resp 18   Ht 5' 6"  (1.676 m)   Wt 59.9 kg (132 lb)    SpO2 97%   BMI 21.31 kg/m   Physical Exam: General: Pt awake/alert/oriented x4 in no major acute distress Eyes: PERRL, normal EOM. Sclera nonicteric Neuro: CN II-XII intact w/o focal sensory/motor deficits. Lymph: No head/neck/groin lymphadenopathy Psych:  No delerium/psychosis/paranoia.  Somewhat angry irritable and withdrawn at first.  Seem to calm down at the end.   HENT: Normocephalic, Mucus membranes moist.  No thrush Neck: Supple, No tracheal deviation Chest: No pain.  Good respiratory excursion. CV:  Pulses intact.  Regular rhythm Abdomen: Soft, overweight.  Mild discomfort on left side of abdomen.  Focal point tenderness and right lower quadrant over McBurney's point with voluntary guarding.  Nondistended.  No diastases.  No umbilical hernia  No inguinal hernias.  No inguinal lymphadenopathy.   Rectal exam deferred.   Ext:  SCDs BLE.  No significant edema.  No cyanosis Skin: No petechiae / purpurea.  No major sores Musculoskeletal: No  severe joint pain.  Good ROM major joints   Results:   Labs: Results for orders placed or performed during the hospital encounter of 08/03/16 (from the past 48 hour(s))  CBC with Differential/Platelet     Status: Abnormal   Collection Time: 08/03/16  3:59 PM  Result Value Ref Range   WBC 11.0 (H) 4.0 - 10.5 K/uL   RBC 3.85 (L) 3.87 - 5.11 MIL/uL   Hemoglobin 12.1 12.0 - 15.0 g/dL   HCT 37.6 36.0 - 46.0 %   MCV 97.7 78.0 - 100.0 fL   MCH 31.4 26.0 - 34.0 pg   MCHC 32.2 30.0 - 36.0 g/dL   RDW 14.2 11.5 - 15.5 %   Platelets 250 150 - 400 K/uL   Neutrophils Relative % 80 %   Neutro Abs 8.8 (H) 1.7 - 7.7 K/uL   Lymphocytes Relative 13 %   Lymphs Abs 1.4 0.7 - 4.0 K/uL   Monocytes Relative 6 %   Monocytes Absolute 0.7 0.1 - 1.0 K/uL   Eosinophils Relative 1 %   Eosinophils Absolute 0.1 0.0 - 0.7 K/uL   Basophils Relative 0 %   Basophils Absolute 0.0 0.0 - 0.1 K/uL  Basic metabolic panel     Status: None   Collection Time: 08/03/16   3:59 PM  Result Value Ref Range   Sodium 138 135 - 145 mmol/L   Potassium 3.9 3.5 - 5.1 mmol/L   Chloride 104 101 - 111 mmol/L   CO2 28 22 - 32 mmol/L   Glucose, Bld 97 65 - 99 mg/dL   BUN 15 6 - 20 mg/dL   Creatinine, Ser 0.63 0.44 - 1.00 mg/dL   Calcium 9.2 8.9 - 10.3 mg/dL   GFR calc non Af Amer >60 >60 mL/min   GFR calc Af Amer >60 >60 mL/min    Comment: (NOTE) The eGFR has been calculated using the CKD EPI equation. This calculation has not been validated in all clinical situations. eGFR's persistently <60 mL/min signify possible Chronic Kidney Disease.    Anion gap 6 5 - 15    Imaging / Studies: Ct Abdomen Pelvis W Contrast  Result Date: 08/03/2016 CLINICAL DATA:  Constipation, left upper quadrant abdominal pain, history of cholecystectomy and right breast cancer, evaluate for diverticulitis EXAM: CT ABDOMEN AND PELVIS WITH CONTRAST TECHNIQUE: Multidetector CT imaging of the abdomen and pelvis was performed using the standard protocol following bolus administration of intravenous contrast. CONTRAST:  158m ISOVUE-300 IOPAMIDOL (ISOVUE-300) INJECTION 61% COMPARISON:  07/03/2013 FINDINGS: Lower chest: Mild linear scarring in the lingula. Status post right mastectomy. Hepatobiliary: Liver is within normal limits. Status post cholecystectomy. No intrahepatic ductal dilatation. Dilated common duct, measuring 10 mm, and smoothly tapering at the ampulla. Pancreas: Within normal limits. Spleen: Within normal limits. Adrenals/Urinary Tract: Adrenal glands within normal limits. Kidneys are within normal limits.  No hydronephrosis. Bladder is within normal limits. Stomach/Bowel: Stomach is within normal limits. No evidence of bowel obstruction. Dilated distal appendix, measuring up to 10 mm, with associated periappendiceal inflammatory changes (series 2/ image 51), reflecting acute appendicitis. No pericolonic inflammatory changes to suggest diverticulitis. Vascular/Lymphatic: No evidence of  abdominal aortic aneurysm. Atherosclerotic calcifications of the abdominal aorta and branch vessels. No suspicious abdominopelvic lymphadenopathy. Reproductive: Uterus is within normal limits. Bilateral ovaries are within normal limits. Other: No abdominopelvic ascites. No drainable fluid collection/ abscess.  No free air. Musculoskeletal: Mild degenerative changes at L4-5. IMPRESSION: Acute appendicitis. No drainable fluid collection/abscess. No free air. Electronically Signed  By: Julian Hy M.D.   On: 08/03/2016 18:43   Dg Abd 2 Views  Result Date: 08/01/2016 CLINICAL DATA:  Constipation. EXAM: ABDOMEN - 2 VIEW COMPARISON:  CT 07/03/2013 . FINDINGS: Soft tissue structures are unremarkable. No bowel distention or free air. Several nonspecific air-filled loops of small bowel are noted. Mild thickening of the small bowel folds cannot be excluded. Process such as enteritis cannot be excluded. Moderate amount of stool noted throughout the colon. Surgical clips right upper quadrant. Aortoiliac atherosclerotic vascular calcification. Degenerative changes lumbar spine and both hips. IMPRESSION: 1. Moderate amount stool noted throughout the colon. Constipation cannot be excluded. 2. Several nonspecific air-filled loops of small bowel are noted. No bowel distention noted. There is questionable mild thickening of the visualized small bowel folds. A process such as enteritis cannot be entirely excluded . No free air. 3.  Aortoiliac atherosclerotic vascular disease. Electronically Signed   By: Marcello Moores  Register   On: 08/01/2016 16:57    Medications / Allergies: per chart  Antibiotics: Anti-infectives    Start     Dose/Rate Route Frequency Ordered Stop   08/03/16 2000  cefOXitin (MEFOXIN) 1 g in dextrose 5 % 50 mL IVPB     1 g 100 mL/hr over 30 Minutes Intravenous  Once 08/03/16 1946 08/03/16 2040   08/03/16 1950  cefOXitin (MEFOXIN) 1 g injection    Comments:  Anner Crete   : cabinet override       08/03/16 1950 08/04/16 [place]  75 y.o. female       Problem List:  Principal Problem:   Acute appendicitis Active Problems:   Chronic alcoholism in remission ([place])   Anxiety and depression   Acute appendicitis by physical exam and CT scan.  Chronic constipation but no stool burden on today's CT scan.  Long redundant colon but no diverticulitis or volvulus or other abnormality.  Plan:  Admit.  IV fluids.  IV antibiotic.  Lap appy:  The anatomy & physiology of the digestive tract was discussed.  The pathophysiology of appendicitis and other appendiceal disorders were discussed.  Natural history risks without surgery was discussed.   I feel the risks of no intervention will lead to serious problems that outweigh the operative risks; therefore, I recommended diagnostic laparoscopy with removal of appendix to remove the pathology.  Laparoscopic & open techniques were discussed.   I noted a good likelihood this will help address the problem.   Risks such as bleeding, infection, abscess, leak, reoperation, injury to other organs, need for repair of tissues / organs, possible ostomy, hernia, heart attack, stroke, death, and other risks were discussed.  Goals of post-operative recovery were discussed as well.  We will work to minimize complications.  Questions were answered.  The patient & husband expresses understanding & wishes to proceed with surgery.  Pain and nausea control.  Anxiolysis.  Insomnia control.  She was quite annoyed that I recommended she not eat right now.  However I tried to reassure her that of course she can get her home medications.   That seemed to help calm her down.  I did caution her with the appendicitis that sometimes the bowels fall asleep an oral medications do not work.   I will work to try and have IV medication back up just in case.She was hoping to go home right away.  I noted if we are catching it early and she meets  discharge goals, she  may be able to leave later the same day of surgery.  But most people go home postoperative day #1.     -VTE prophylaxis- SCDs, etc  -mobilize as tolerated to help recovery    Adin Hector, M.D., F.A.C.S. Gastrointestinal and Minimally Invasive Surgery [facility] [address], [place], [place] [address] [phone] Main / Paging   08/03/2016  Note: Portions of this report may have been transcribed using voice recognition software. Every effort was made to ensure accuracy; however, inadvertent computerized transcription errors may be present.   Any transcriptional errors that result from this process are unintentional.

## 2016-08-03 NOTE — ED Notes (Signed)
Pt requesting to leave. I have requested unit secretary to page the surgeon.

## 2016-08-03 NOTE — ED Notes (Signed)
Reddened rash was noted to left upper arm above IV site prior to insertion.

## 2016-08-03 NOTE — ED Notes (Signed)
Patient ambulated to restroom without difficulty.

## 2016-08-03 NOTE — Telephone Encounter (Signed)
[facility]  Patient Name: Anne [name]  DOB: 15-Dec-1940    Initial Comment Caller states has stomach pain and has done the enema but nothing comes out but water    Nurse Assessment  Nurse: Harlow Mares, RN, Suanne Marker Date/Time Eilene Ghazi Time): 08/03/2016 1:58:44 PM  Confirm and document reason for call. If symptomatic, describe symptoms. You must click the next button to save text entered. ---Caller states has stomach pain and has done the enema but nothing comes out but water. Having constipation issues since the end of Aug. Saw NP on Wed, Xray done and was told that she has constipation. She prescribed a fleets enema and stool softeners with no results except brown water. Reports abd pain on left side, below the belly button.  Has the patient traveled out of the country within the last 30 days? ---Not Applicable  Does the patient have any new or worsening symptoms? ---Yes  Will a triage be completed? ---Yes  Related visit to physician within the last 2 weeks? ---Yes  Does the PT have any chronic conditions? (i.e. diabetes, asthma, etc.) ---Yes  List chronic conditions. ---PVS's?  Is this a behavioral health or substance abuse call? ---No     Guidelines    Guideline Title Affirmed Question Affirmed Notes  Abdominal Pain - Female [1] MILD-MODERATE pain AND [2] constant AND [3] present > 2 hours    Final Disposition User   See Physician within 4 Hours (or PCP triage) Harlow Mares, RN, [place]    Comments  Caller reports that she will be seen in the ED since she needs further enemas.   Referrals  GO TO FACILITY UNDECIDED   Disagree/Comply: Comply

## 2016-08-03 NOTE — ED Triage Notes (Signed)
Reports hasnt had a decent bowel movement since end of august.  Reports has tried laxatives, stool softerners.  Saw PCP yesterday prescribed miralax. Had liquid brown water from enema today.  Had xray yesterday reported stool throughout colon.

## 2016-08-03 NOTE — ED Provider Notes (Signed)
[facility]PT [facility] Provider Note   CSN: [id number] Arrival date & time: 08/03/16  1459     History   Chief Complaint Chief Complaint  Patient presents with  . Constipation    HPI Anne [name] is a 75 y.o. female.  HPI CC: constipation  Onset/Duration: 2 months Timing: constant Quality: decreased BM Severity: moderate Modifying Factors:  Improved by: dulcolax last week  Worsened by: nothing Associated Signs/Symptoms:  Pertinent (+): LLQ abd pain  Pertinent (-): fevers, chills, N/V, diff voiding, dysuria,   Past Medical History:  Diagnosis Date  . ALKALINE PHOSPHATASE, ELEVATED 03/15/2009  . Allergic state 06/10/2012  . Anemia   . Anxiety and depression 04/28/2011  . Arthritis   . Atypical chest pain 11/30/2011  . 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 Baker's cyst of knee 05/22/2011  . Cancer ([place]) 01,  08   XRT/chemo 01-02/ lobular invasive ca  . Carotid artery disease ([place])    a. Carotid duplex 03/2014: stable 1-39% BICA, f/u due 03/2016.  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    [name] COPD (chronic obstructive pulmonary disease) ([place])   . Depression   . Dermatitis 11/23/2012  . Dysphagia   . EE (eosinophilic esophagitis)   . Elevated sed rate 08/02/2013  . Esophageal ring   . ESOPHAGEAL STRICTURE 03/29/2009  . Fall 11/23/2012  . Folliculitis of nose [id number]  . GERD (gastroesophageal reflux disease) 09/29/2009   improved s/p cholecystectomy and esophagus dilatation  . Hematuria   . History of chicken pox   . History of measles   . History of shingles    2 episodes  . Hx of echocardiogram    a. Echo 01/2013: mild LVH, EF 55-60%, normal wall motion, Gr 1 diast dysfn  . Hyperlipidemia   . Knee pain, bilateral 07/23/2011  . Medicare annual wellness visit, subsequent 06/19/2015  . Mixed hyperlipidemia 10/17/2010   Qualifier: Diagnosis of  By: Mack Guise      . Orthostasis   . Osteopenia 03/14/2011  . 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 Pneumonia 2-10   pleurisy  . Preventative health care 10/21/2012  . PVC's (premature ventricular contractions)    a. Event monitor 01/2013: NSR, extensive PVCs.  . Radial neck fracture 10/2011   minimally displaced  . Sinusitis, acute 12/04/2015  . Trauma 10/18/2011  . URI (upper respiratory infection) 09/02/2012  . Urinary frequency 12/12/2014  . Urinary incontinence 03/19/2012  . Vaginitis 05/22/2011    Patient Active Problem List   Diagnosis Date Noted  . Pain in the chest 05/16/2016  . Status post right breast reconstruction 05/11/2016  . Breast cancer 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 ([place]) 05/11/2016  . History of breast cancer in female 05/11/2016  . Osteopenia determined by x-ray 05/11/2016  . IBS (irritable bowel syndrome) 03/09/2016  . Sinusitis, acute 12/04/2015  . Shortness of breath 06/19/2015  . Medicare annual wellness visit, subsequent 06/19/2015  . Urinary frequency 12/12/2014  . Breast cancer, right breast ([place]) 10/18/2014  . Superficial thrombophlebitis 03/16/2014  . Plant dermatitis 03/16/2014  . Chest wall pain 02/07/2014  . Primary localized osteoarthrosis, lower leg 01/21/2014  . Elevated sed rate 08/02/2013  . Dermatitis 11/23/2012  . Preventative health care 10/21/2012  . Peanut allergy suspected 07/15/2012  . Allergic state 06/10/2012  . Urinary incontinence 03/19/2012  . Anemia 12/21/2011  . Knee pain, bilateral 07/23/2011  . Diarrhea 05/22/2011  .  Baker's cyst of knee 05/22/2011  . Hematuria 05/22/2011  . Eosinophilic esophagitis [id number]  . Anxiety and depression 04/28/2011  . Tachycardia 03/29/2011  . 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 History of chicken pox   . History of shingles   . History of measles   . Mixed hyperlipidemia 10/17/2010  . CAROTID ARTERY STENOSIS 01/13/2010  . GERD 09/29/2009  . 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 ESOPHAGEAL  STRICTURE 03/29/2009    Past Surgical History:  Procedure Laterality Date  . ANTERIOR CRUCIATE LIGAMENT REPAIR  08, 09, 10  . BREAST LUMPECTOMY  2001  . BREAST RECONSTRUCTION  2008, 2009, 2010  . CHOLECYSTECTOMY  2010  . COLONOSCOPY  09/05/10   [facility]  . ERCP  2010    CBD stone extraction   . ESOPHAGOGASTRODUODENOSCOPY  01/08/2012   Procedure: ESOPHAGOGASTRODUODENOSCOPY (EGD);  Surgeon: Gatha Mayer, MD;  Location: [facility];  Service: Endoscopy;  Laterality: N/A;  . ESOPHAGOGASTRODUODENOSCOPY (EGD) WITH ESOPHAGEAL DILATION  2010, 2012  . MASTECTOMY MODIFIED RADICAL     , Mastectomy modified radical (08), breast reconstruction, CA lesions excised lateral abd wall 2010  . SAVORY DILATION  01/08/2012   Procedure: SAVORY DILATION;  Surgeon: Gatha Mayer, MD;  Location: [facility];  Service: Endoscopy;  Laterality: N/A;  need xray    OB History    No data available       Home Medications    Prior to Admission medications   Medication Sig Start Date End Date Taking? Authorizing Provider  amitriptyline (ELAVIL) 150 MG tablet take 1 tablet by mouth at bedtime 05/17/16   [name], MD  aspirin 81 MG tablet Take 81 mg by mouth daily.      Historical Provider, MD  atorvastatin (LIPITOR) 40 MG tablet TAKE 1 TABLET EVERY DAY 04/03/16   [name], MD  Cholecalciferol (VITAMIN D3) 1000 UNITS CAPS Take 2 tablets by mouth daily.      Historical Provider, MD  Cyanocobalamin (VITAMIN B 12 PO) Take 1 tablet by mouth daily.    Historical Provider, MD  diltiazem (CARDIZEM) 30 MG tablet Take 1 tablet (30 mg total) by mouth daily as needed (for palpitations). 10/26/15   Fay Records, MD  LORazepam (ATIVAN) 1 MG tablet Take 1 tablet (1 mg total) by mouth every 8 (eight) hours as needed for anxiety. 05/10/16   [name], MD  pantoprazole (PROTONIX) 40 MG tablet Take 1 tablet (40 mg total) by mouth daily. 08/01/16   Debbrah Alar, NP  Probiotic Product (PROBIOTIC FORMULA PO)  Take 1 capsule by mouth daily.    Historical Provider, MD    Family History Family History  Problem Relation Age of Onset  . Heart disease Father   . Lung cancer Father     smoker  . Cirrhosis Sister     Primary Biliary  . Stroke Maternal Grandmother   . Alcohol abuse Maternal Grandfather   . Heart disease Paternal Grandfather   . Anxiety disorder Sister   . Osteoporosis Sister   . Arthritis Sister     Rheumatoid  . Osteoporosis Sister   . Skin cancer Sister     multiple skin cancers, over 16 excisions.  . Other Mother     tic [place]  . Breast cancer Maternal Aunt     x 3  . Breast cancer Paternal Aunt   . Anesthesia problems Neg Hx   . Hypotension Neg Hx   . Malignant  hyperthermia Neg Hx   . Pseudochol deficiency Neg Hx   . Colon cancer Neg Hx   . Ovarian cancer Neg Hx   . Pancreatic cancer Neg Hx     Social History Social History  Substance Use Topics  . Smoking status: Former Smoker    Types: Cigarettes    Quit date: 05/22/2007  . Smokeless tobacco: Never Used  . Alcohol use 0.6 oz/week    1 Glasses of wine per week     Comment: at least 6 oz daily     Allergies   Codeine; Morphine; Peanut-containing drug products; Sorbitol; Tomato; Zofran; and Diphenhydramine hcl   Review of Systems Review of Systems  Constitutional: Negative for chills, fatigue and fever.  HENT: Negative for congestion and sore throat.   Eyes: Negative for visual disturbance.  Respiratory: Negative for cough, chest tightness and shortness of breath.   Cardiovascular: Negative for chest pain and palpitations.  Gastrointestinal: Positive for abdominal pain and constipation. Negative for blood in stool, diarrhea, nausea and vomiting.  Genitourinary: Negative for decreased urine volume and difficulty urinating.  Musculoskeletal: Negative for back pain and neck stiffness.  Skin: Negative for rash.  Neurological: Negative for light-headedness and headaches.  Psychiatric/Behavioral:  Negative for confusion.  All other systems reviewed and are negative.    Physical Exam Updated Vital Signs BP 109/80 (BP Location: Left Arm)   Pulse 92   Temp 97.9 F (36.6 C) (Oral)   Resp 18   Ht 5\' 6"  (1.676 m)   Wt 132 lb (59.9 kg)   SpO2 98%   BMI 21.31 kg/m   Physical Exam  Constitutional: She is oriented to person, place, and time. She appears well-developed and well-nourished. No distress.  HENT:  Head: Normocephalic and atraumatic.  Nose: Nose normal.  Eyes: Conjunctivae and EOM are normal. Pupils are equal, round, and reactive to light. Right eye exhibits no discharge. Left eye exhibits no discharge. No scleral icterus.  Neck: Normal range of motion. Neck supple.  Cardiovascular: Normal rate and regular rhythm.  Exam reveals no gallop and no friction rub.   No murmur heard. Pulmonary/Chest: Effort normal and breath sounds normal. No stridor. No respiratory distress. She has no rales.  Abdominal: Soft. She exhibits no distension. There is tenderness in the right lower quadrant, suprapubic area and left lower quadrant. There is guarding. There is no rigidity, no rebound and no CVA tenderness. No hernia.  Genitourinary: Rectum normal.  Genitourinary Comments: Rectal vault without stool  Musculoskeletal: She exhibits no edema or tenderness.  Neurological: She is alert and oriented to person, place, and time.  Skin: Skin is warm and dry. No rash noted. She is not diaphoretic. No erythema.  Psychiatric: She has a normal mood and affect.  Vitals reviewed.    ED Treatments / Results  Labs (all labs ordered are listed, but only abnormal results are displayed) Labs Reviewed  CBC WITH DIFFERENTIAL/PLATELET - Abnormal; Notable for the following:       Result Value   WBC 11.0 (*)    RBC 3.85 (*)    Neutro Abs 8.8 (*)    All other components within normal limits  BASIC METABOLIC PANEL    EKG  EKG Interpretation None       Radiology Ct Abdomen Pelvis W  Contrast  Result Date: 08/03/2016 CLINICAL DATA:  Constipation, left upper quadrant abdominal pain, history of cholecystectomy and right breast cancer, evaluate for diverticulitis EXAM: CT ABDOMEN AND PELVIS WITH CONTRAST TECHNIQUE: Multidetector CT  imaging of the abdomen and pelvis was performed using the standard protocol following bolus administration of intravenous contrast. CONTRAST:  120mL ISOVUE-300 IOPAMIDOL (ISOVUE-300) INJECTION 61% COMPARISON:  07/03/2013 FINDINGS: Lower chest: Mild linear scarring in the lingula. Status post right mastectomy. Hepatobiliary: Liver is within normal limits. Status post cholecystectomy. No intrahepatic ductal dilatation. Dilated common duct, measuring 10 mm, and smoothly tapering at the ampulla. Pancreas: Within normal limits. Spleen: Within normal limits. Adrenals/Urinary Tract: Adrenal glands within normal limits. Kidneys are within normal limits.  No hydronephrosis. Bladder is within normal limits. Stomach/Bowel: Stomach is within normal limits. No evidence of bowel obstruction. Dilated distal appendix, measuring up to 10 mm, with associated periappendiceal inflammatory changes (series 2/ image 51), reflecting acute appendicitis. No pericolonic inflammatory changes to suggest diverticulitis. Vascular/Lymphatic: No evidence of abdominal aortic aneurysm. Atherosclerotic calcifications of the abdominal aorta and branch vessels. No suspicious abdominopelvic lymphadenopathy. Reproductive: Uterus is within normal limits. Bilateral ovaries are within normal limits. Other: No abdominopelvic ascites. No drainable fluid collection/ abscess.  No free air. Musculoskeletal: Mild degenerative changes at L4-5. IMPRESSION: Acute appendicitis. No drainable fluid collection/abscess. No free air. Electronically Signed   By: Julian Hy M.D.   On: 08/03/2016 18:43    Procedures Procedures (including critical care time)  Medications Ordered in ED Medications  cefOXitin  (MEFOXIN) 1 g injection (not administered)  sodium chloride 0.9 % bolus 500 mL (0 mLs Intravenous Stopped 08/03/16 1659)  iopamidol (ISOVUE-300) 61 % injection 100 mL (100 mLs Intravenous Contrast Given 08/03/16 1828)  acetaminophen (TYLENOL) tablet 1,000 mg (1,000 mg Oral Given 08/03/16 1841)  cefOXitin (MEFOXIN) 1 g in dextrose 5 % 50 mL IVPB (0 g Intravenous Stopped 08/03/16 2040)  fentaNYL (SUBLIMAZE) injection 50 mcg (50 mcg Intravenous Given 08/03/16 2048)     Initial Impression / Assessment and Plan / ED Course  I have reviewed the triage vital signs and the nursing notes.  Pertinent labs & imaging results that were available during my care of the patient were reviewed by me and considered in my medical decision making (see chart for details).  Clinical Course    1 day of lower abdominal pain in the setting of several months of constipation. No prior history of diverticulitis. No prior abdominal surgeries. No evidence of impaction on exam. Screening labs obtained revealed leukocytosis. No evidence of renal sufficiency. Given the lower abdominal pain we'll obtain CT scan to assess for diverticulitis.  CT without evidence of diverticulitis however patient with appendicitis. No evidence of perforation or abscess formation. Patient given a dose of cefoxitin. Spoke with general surgery, Dr. Johney Maine, who requested patient be transferred to [facility] long emergency department. Once there he will evaluate the patient and determine course of action.  I spoke with Dr. Sherry Ruffing and inform them of patient's findings and current plan. Emergency Department team will be awaiting the patient and contact the surgeon upon arrival.  Final Clinical Impressions(s) / ED Diagnoses   Final diagnoses:  Acute appendicitis with localized peritonitis  Leukocytosis  Constipation, unspecified constipation type    Disposition: Transfer  Condition: Stable    Fatima Blank, MD 08/04/16 0131

## 2016-08-03 NOTE — ED Notes (Signed)
MD at bedside. 

## 2016-08-03 NOTE — Telephone Encounter (Signed)
Per patient's chart, pt is currently in the ER.

## 2016-08-04 ENCOUNTER — Encounter (HOSPITAL_COMMUNITY): Payer: Self-pay

## 2016-08-04 ENCOUNTER — Observation Stay (HOSPITAL_COMMUNITY): Payer: Commercial Managed Care - HMO | Admitting: Anesthesiology

## 2016-08-04 ENCOUNTER — Encounter (HOSPITAL_COMMUNITY)
Admission: EM | Disposition: A | Discharge status: Home / Self Care | Payer: Self-pay | Source: Home / Self Care | Attending: Emergency Medicine

## 2016-08-04 DIAGNOSIS — K353 Acute appendicitis with localized peritonitis: Secondary | ICD-10-CM | POA: Diagnosis not present

## 2016-08-04 DIAGNOSIS — Z79899 Other long term (current) drug therapy: Secondary | ICD-10-CM | POA: Diagnosis not present

## 2016-08-04 DIAGNOSIS — F419 Anxiety disorder, unspecified: Secondary | ICD-10-CM | POA: Diagnosis not present

## 2016-08-04 DIAGNOSIS — F1021 Alcohol dependence, in remission: Secondary | ICD-10-CM | POA: Diagnosis not present

## 2016-08-04 DIAGNOSIS — J449 Chronic obstructive pulmonary disease, unspecified: Secondary | ICD-10-CM | POA: Diagnosis not present

## 2016-08-04 DIAGNOSIS — K3580 Unspecified acute appendicitis: Secondary | ICD-10-CM | POA: Diagnosis not present

## 2016-08-04 DIAGNOSIS — Z7982 Long term (current) use of aspirin: Secondary | ICD-10-CM | POA: Diagnosis not present

## 2016-08-04 DIAGNOSIS — Z853 Personal history of malignant neoplasm of breast: Secondary | ICD-10-CM | POA: Diagnosis not present

## 2016-08-04 DIAGNOSIS — K37 Unspecified appendicitis: Secondary | ICD-10-CM | POA: Diagnosis not present

## 2016-08-04 DIAGNOSIS — K219 Gastro-esophageal reflux disease without esophagitis: Secondary | ICD-10-CM | POA: Diagnosis not present

## 2016-08-04 DIAGNOSIS — I739 Peripheral vascular disease, unspecified: Secondary | ICD-10-CM | POA: Diagnosis not present

## 2016-08-04 DIAGNOSIS — E782 Mixed hyperlipidemia: Secondary | ICD-10-CM | POA: Diagnosis not present

## 2016-08-04 HISTORY — PX: LAPAROSCOPIC APPENDECTOMY: SHX408

## 2016-08-04 LAB — SURGICAL PCR SCREEN
MRSA, PCR: NEGATIVE
STAPHYLOCOCCUS AUREUS: NEGATIVE

## 2016-08-04 SURGERY — APPENDECTOMY, LAPAROSCOPIC
Anesthesia: General | Site: Abdomen

## 2016-08-04 MED ORDER — SUGAMMADEX SODIUM 200 MG/2ML IV SOLN
INTRAVENOUS | Status: DC | PRN
  Administered 2016-08-04: 150 mg via INTRAVENOUS

## 2016-08-04 MED ORDER — IBUPROFEN 400 MG PO TABS: 400.0000 mg | ORAL_TABLET | Freq: Three times a day (TID) | ORAL | Status: DC | PRN

## 2016-08-04 MED ORDER — OXYCODONE HCL 5 MG PO TABS
5.0000 mg | ORAL_TABLET | ORAL | Status: DC | PRN
  Administered 2016-08-04: 5 mg via ORAL
  Filled 2016-08-04: qty 1

## 2016-08-04 MED ORDER — ACETAMINOPHEN 325 MG PO TABS: 650.0000 mg | ORAL_TABLET | Freq: Four times a day (QID) | ORAL | Status: DC | PRN

## 2016-08-04 MED ORDER — MIDAZOLAM HCL 2 MG/2ML IJ SOLN
INTRAMUSCULAR | Status: AC
  Filled 2016-08-04: qty 2

## 2016-08-04 MED ORDER — FENTANYL CITRATE (PF) 100 MCG/2ML IJ SOLN: 25.0000 ug | INTRAMUSCULAR | Status: DC | PRN

## 2016-08-04 MED ORDER — ACETAMINOPHEN 10 MG/ML IV SOLN
INTRAVENOUS | Status: AC
  Filled 2016-08-04: qty 100

## 2016-08-04 MED ORDER — VITAMIN B-12 100 MCG PO TABS
100.0000 ug | ORAL_TABLET | Freq: Every day | ORAL | Status: DC
  Filled 2016-08-04: qty 1

## 2016-08-04 MED ORDER — PROPOFOL 10 MG/ML IV BOLUS
INTRAVENOUS | Status: AC
  Filled 2016-08-04: qty 40

## 2016-08-04 MED ORDER — METHOCARBAMOL 500 MG PO TABS: 1000.0000 mg | ORAL_TABLET | Freq: Four times a day (QID) | ORAL | Status: DC | PRN

## 2016-08-04 MED ORDER — DEXTROSE 5 % IV SOLN
2.0000 g | INTRAVENOUS | Status: DC
  Filled 2016-08-04: qty 2

## 2016-08-04 MED ORDER — SODIUM CHLORIDE 0.9% FLUSH: 3.0000 mL | Freq: Two times a day (BID) | INTRAVENOUS | Status: DC

## 2016-08-04 MED ORDER — SODIUM CHLORIDE 0.9 % IV SOLN: 250.0000 mL | INTRAVENOUS | Status: DC | PRN

## 2016-08-04 MED ORDER — METRONIDAZOLE IN NACL 5-0.79 MG/ML-% IV SOLN
INTRAVENOUS | Status: AC
  Filled 2016-08-04: qty 100

## 2016-08-04 MED ORDER — ROCURONIUM BROMIDE 100 MG/10ML IV SOLN
INTRAVENOUS | Status: DC | PRN
  Administered 2016-08-04: 40 mg via INTRAVENOUS

## 2016-08-04 MED ORDER — HYDRALAZINE HCL 20 MG/ML IJ SOLN: 10.0000 mg | INTRAMUSCULAR | Status: DC | PRN

## 2016-08-04 MED ORDER — METRONIDAZOLE IN NACL 5-0.79 MG/ML-% IV SOLN
500.0000 mg | Freq: Three times a day (TID) | INTRAVENOUS | Status: DC
  Administered 2016-08-04 (×2): 500 mg via INTRAVENOUS
  Filled 2016-08-04: qty 100

## 2016-08-04 MED ORDER — DEXAMETHASONE SODIUM PHOSPHATE 10 MG/ML IJ SOLN
INTRAMUSCULAR | Status: AC
  Filled 2016-08-04: qty 1

## 2016-08-04 MED ORDER — LACTATED RINGERS IV SOLN
INTRAVENOUS | Status: DC | PRN
  Administered 2016-08-04 (×2): via INTRAVENOUS

## 2016-08-04 MED ORDER — SODIUM CHLORIDE 0.9 % IV SOLN
INTRAVENOUS | Status: DC
  Administered 2016-08-04 (×2): via INTRAVENOUS

## 2016-08-04 MED ORDER — BUPIVACAINE-EPINEPHRINE 0.5% -1:200000 IJ SOLN
INTRAMUSCULAR | Status: AC
  Filled 2016-08-04: qty 2

## 2016-08-04 MED ORDER — ACETAMINOPHEN 650 MG RE SUPP
650.0000 mg | Freq: Four times a day (QID) | RECTAL | Status: DC | PRN
Start: 2016-08-04 — End: 2016-08-04

## 2016-08-04 MED ORDER — FENTANYL CITRATE (PF) 100 MCG/2ML IJ SOLN
INTRAMUSCULAR | Status: DC | PRN
  Administered 2016-08-04: 100 ug via INTRAVENOUS
  Administered 2016-08-04: 50 ug via INTRAVENOUS

## 2016-08-04 MED ORDER — FENTANYL CITRATE (PF) 250 MCG/5ML IJ SOLN
INTRAMUSCULAR | Status: AC
  Filled 2016-08-04: qty 5

## 2016-08-04 MED ORDER — DEXAMETHASONE SODIUM PHOSPHATE 10 MG/ML IJ SOLN
INTRAMUSCULAR | Status: DC | PRN
  Administered 2016-08-04: 10 mg via INTRAVENOUS

## 2016-08-04 MED ORDER — GABAPENTIN 300 MG PO CAPS: 300.0000 mg | ORAL_CAPSULE | ORAL | Status: DC

## 2016-08-04 MED ORDER — ACETAMINOPHEN 500 MG PO TABS: 500.0000 mg | ORAL_TABLET | Freq: Four times a day (QID) | ORAL | Status: DC | PRN

## 2016-08-04 MED ORDER — ONDANSETRON HCL 4 MG/2ML IJ SOLN
INTRAMUSCULAR | Status: AC
  Filled 2016-08-04: qty 4

## 2016-08-04 MED ORDER — LORAZEPAM 2 MG/ML IJ SOLN: 0.5000 mg | Freq: Three times a day (TID) | INTRAMUSCULAR | Status: DC | PRN

## 2016-08-04 MED ORDER — FERROUS SULFATE 325 (65 FE) MG PO TABS: 325.0000 mg | ORAL_TABLET | Freq: Every day | ORAL | Status: DC

## 2016-08-04 MED ORDER — KETOROLAC TROMETHAMINE 15 MG/ML IJ SOLN
INTRAMUSCULAR | Status: DC | PRN
  Administered 2016-08-04: 15 mg via INTRAVENOUS

## 2016-08-04 MED ORDER — SIMETHICONE 80 MG PO CHEW
40.0000 mg | CHEWABLE_TABLET | Freq: Four times a day (QID) | ORAL | Status: DC | PRN
  Filled 2016-08-04: qty 1

## 2016-08-04 MED ORDER — LACTATED RINGERS IV SOLN: INTRAVENOUS | Status: DC

## 2016-08-04 MED ORDER — METRONIDAZOLE IN NACL 5-0.79 MG/ML-% IV SOLN: 500.0000 mg | Freq: Three times a day (TID) | INTRAVENOUS | Status: DC

## 2016-08-04 MED ORDER — PHENYLEPHRINE HCL 10 MG/ML IJ SOLN
INTRAMUSCULAR | Status: DC | PRN
  Administered 2016-08-04 (×2): 120 ug via INTRAVENOUS

## 2016-08-04 MED ORDER — MIDAZOLAM HCL 5 MG/5ML IJ SOLN
INTRAMUSCULAR | Status: DC | PRN
  Administered 2016-08-04: 1 mg via INTRAVENOUS

## 2016-08-04 MED ORDER — METHOCARBAMOL 1000 MG/10ML IJ SOLN
1000.0000 mg | Freq: Four times a day (QID) | INTRAVENOUS | Status: DC | PRN
  Filled 2016-08-04: qty 10

## 2016-08-04 MED ORDER — ACETAMINOPHEN 10 MG/ML IV SOLN
INTRAVENOUS | Status: DC | PRN
  Administered 2016-08-04: 1000 mg via INTRAVENOUS

## 2016-08-04 MED ORDER — VITAMIN D 1000 UNITS PO TABS: 2000.0000 [IU] | ORAL_TABLET | Freq: Every day | ORAL | Status: DC

## 2016-08-04 MED ORDER — CHLORHEXIDINE GLUCONATE CLOTH 2 % EX PADS: 6.0000 | MEDICATED_PAD | Freq: Once | CUTANEOUS | Status: DC

## 2016-08-04 MED ORDER — TRAMADOL HCL 50 MG PO TABS: 50.0000 mg | ORAL_TABLET | Freq: Four times a day (QID) | ORAL | Status: DC | PRN

## 2016-08-04 MED ORDER — BUPIVACAINE LIPOSOME 1.3 % IJ SUSP
20.0000 mL | Freq: Once | INTRAMUSCULAR | Status: AC
  Administered 2016-08-04: 20 mL
  Filled 2016-08-04: qty 20

## 2016-08-04 MED ORDER — SODIUM CHLORIDE 0.9% FLUSH: 3.0000 mL | INTRAVENOUS | Status: DC | PRN

## 2016-08-04 MED ORDER — ASPIRIN EC 81 MG PO TBEC
81.0000 mg | DELAYED_RELEASE_TABLET | Freq: Every day | ORAL | Status: DC
  Administered 2016-08-04: 81 mg via ORAL
  Filled 2016-08-04: qty 1

## 2016-08-04 MED ORDER — ENOXAPARIN SODIUM 40 MG/0.4ML ~~LOC~~ SOLN: 40.0000 mg | SUBCUTANEOUS | Status: DC

## 2016-08-04 MED ORDER — SUGAMMADEX SODIUM 200 MG/2ML IV SOLN
INTRAVENOUS | Status: AC
  Filled 2016-08-04: qty 2

## 2016-08-04 MED ORDER — BUPIVACAINE-EPINEPHRINE 0.25% -1:200000 IJ SOLN
INTRAMUSCULAR | Status: DC | PRN
  Administered 2016-08-04: 30 mL

## 2016-08-04 MED ORDER — CEFTRIAXONE SODIUM 2 G IJ SOLR
2.0000 g | INTRAMUSCULAR | Status: DC
  Administered 2016-08-04 (×2): 2 g via INTRAVENOUS
  Filled 2016-08-04: qty 2

## 2016-08-04 MED ORDER — LACTATED RINGERS IV BOLUS (SEPSIS): 1000.0000 mL | Freq: Three times a day (TID) | INTRAVENOUS | Status: DC | PRN

## 2016-08-04 MED ORDER — PHENYLEPHRINE 40 MCG/ML (10ML) SYRINGE FOR IV PUSH (FOR BLOOD PRESSURE SUPPORT)
PREFILLED_SYRINGE | INTRAVENOUS | Status: AC
  Filled 2016-08-04: qty 10

## 2016-08-04 MED ORDER — ACETAMINOPHEN 500 MG PO TABS: 1000.0000 mg | ORAL_TABLET | ORAL | Status: DC

## 2016-08-04 MED ORDER — SUCCINYLCHOLINE CHLORIDE 20 MG/ML IJ SOLN
INTRAMUSCULAR | Status: DC | PRN
  Administered 2016-08-04: 100 mg via INTRAVENOUS

## 2016-08-04 MED ORDER — PROCHLORPERAZINE EDISYLATE 5 MG/ML IJ SOLN: 5.0000 mg | INTRAMUSCULAR | Status: DC | PRN

## 2016-08-04 MED ORDER — IBUPROFEN 400 MG PO TABS
800.0000 mg | ORAL_TABLET | Freq: Four times a day (QID) | ORAL | Status: DC
  Administered 2016-08-04: 800 mg via ORAL
  Filled 2016-08-04: qty 2

## 2016-08-04 MED ORDER — TRAMADOL HCL 50 MG PO TABS: 50.0000 mg | ORAL_TABLET | Freq: Four times a day (QID) | ORAL | 0 refills | Status: DC | PRN

## 2016-08-04 MED ORDER — DEXTROSE 5 % IV SOLN
INTRAVENOUS | Status: AC
  Filled 2016-08-04: qty 2

## 2016-08-04 MED ORDER — PROPOFOL 10 MG/ML IV BOLUS
INTRAVENOUS | Status: DC | PRN
  Administered 2016-08-04: 150 mg via INTRAVENOUS

## 2016-08-04 MED ORDER — METOCLOPRAMIDE HCL 5 MG/ML IJ SOLN: 5.0000 mg | Freq: Four times a day (QID) | INTRAMUSCULAR | Status: DC | PRN

## 2016-08-04 MED ORDER — BISACODYL 10 MG RE SUPP: 10.0000 mg | Freq: Two times a day (BID) | RECTAL | Status: DC | PRN

## 2016-08-04 MED ORDER — LIDOCAINE HCL (CARDIAC) 20 MG/ML IV SOLN
INTRAVENOUS | Status: DC | PRN
  Administered 2016-08-04: 25 mg via INTRATRACHEAL

## 2016-08-04 MED ORDER — BUPIVACAINE-EPINEPHRINE 0.5% -1:200000 IJ SOLN
INTRAMUSCULAR | Status: AC
  Filled 2016-08-04: qty 1

## 2016-08-04 SURGICAL SUPPLY — 46 items
APPLIER CLIP 5 13 M/L LIGAMAX5 (MISCELLANEOUS)
APPLIER CLIP ROT 10 11.4 M/L (STAPLE)
APR CLP MED LRG 11.4X10 (STAPLE)
APR CLP MED LRG 5 ANG JAW (MISCELLANEOUS)
BAG SPEC RTRVL LRG 6X4 10 (ENDOMECHANICALS) ×1
CABLE HIGH FREQUENCY MONO STRZ (ELECTRODE) ×3 IMPLANT
CHLORAPREP W/TINT 26ML (MISCELLANEOUS) ×3 IMPLANT
CLIP APPLIE 5 13 M/L LIGAMAX5 (MISCELLANEOUS) IMPLANT
CLIP APPLIE ROT 10 11.4 M/L (STAPLE) IMPLANT
COVER SURGICAL LIGHT HANDLE (MISCELLANEOUS) ×3 IMPLANT
CUTTER FLEX LINEAR 45M (STAPLE) ×2 IMPLANT
DECANTER SPIKE VIAL GLASS SM (MISCELLANEOUS) ×3 IMPLANT
DEVICE TROCAR PUNCTURE CLOSURE (ENDOMECHANICALS) ×2 IMPLANT
DRAPE LAPAROSCOPIC ABDOMINAL (DRAPES) ×3 IMPLANT
DRAPE WARM FLUID 44X44 (DRAPE) ×3 IMPLANT
DRSG TEGADERM 2-3/8X2-3/4 SM (GAUZE/BANDAGES/DRESSINGS) ×6 IMPLANT
DRSG TEGADERM 4X4.75 (GAUZE/BANDAGES/DRESSINGS) ×3 IMPLANT
ELECT REM PT RETURN 9FT ADLT (ELECTROSURGICAL) ×3
ELECTRODE REM PT RTRN 9FT ADLT (ELECTROSURGICAL) ×1 IMPLANT
ENDOLOOP SUT PDS II  0 18 (SUTURE) ×2
ENDOLOOP SUT PDS II 0 18 (SUTURE) IMPLANT
GAUZE SPONGE 2X2 8PLY STRL LF (GAUZE/BANDAGES/DRESSINGS) ×1 IMPLANT
GLOVE ECLIPSE 8.0 STRL XLNG CF (GLOVE) ×3 IMPLANT
GLOVE INDICATOR 8.0 STRL GRN (GLOVE) ×3 IMPLANT
GOWN STRL REUS W/TWL XL LVL3 (GOWN DISPOSABLE) ×6 IMPLANT
IRRIG SUCT STRYKERFLOW 2 WTIP (MISCELLANEOUS) ×3
IRRIGATION SUCT STRKRFLW 2 WTP (MISCELLANEOUS) ×1 IMPLANT
KIT BASIN OR (CUSTOM PROCEDURE TRAY) ×3 IMPLANT
PAD POSITIONING PINK XL (MISCELLANEOUS) ×3 IMPLANT
POUCH SPECIMEN RETRIEVAL 10MM (ENDOMECHANICALS) ×3 IMPLANT
RELOAD 45 VASCULAR/THIN (ENDOMECHANICALS) IMPLANT
RELOAD STAPLE 45 2.5 WHT GRN (ENDOMECHANICALS) IMPLANT
RELOAD STAPLE 45 3.5 BLU ETS (ENDOMECHANICALS) IMPLANT
RELOAD STAPLE TA45 3.5 REG BLU (ENDOMECHANICALS) ×3 IMPLANT
SCISSORS LAP 5X35 DISP (ENDOMECHANICALS) ×3 IMPLANT
SHEARS HARMONIC ACE PLUS 36CM (ENDOMECHANICALS) ×2 IMPLANT
SLEEVE XCEL OPT CAN 5 100 (ENDOMECHANICALS) ×3 IMPLANT
SPONGE GAUZE 2X2 STER 10/PKG (GAUZE/BANDAGES/DRESSINGS) ×2
SUT MNCRL AB 4-0 PS2 18 (SUTURE) ×3 IMPLANT
SUT PDS AB 0 CT1 36 (SUTURE) IMPLANT
SUT SILK 2 0 SH (SUTURE) IMPLANT
TOWEL OR 17X26 10 PK STRL BLUE (TOWEL DISPOSABLE) ×3 IMPLANT
TRAY FOLEY W/METER SILVER 16FR (SET/KITS/TRAYS/PACK) ×2 IMPLANT
TRAY LAPAROSCOPIC (CUSTOM PROCEDURE TRAY) ×3 IMPLANT
TROCAR BLADELESS OPT 5 100 (ENDOMECHANICALS) ×3 IMPLANT
TROCAR XCEL 12X100 BLDLESS (ENDOMECHANICALS) ×3 IMPLANT

## 2016-08-04 NOTE — Progress Notes (Signed)
Nurse reviewed discharge instructions with pt.  Pt verbalized understanding of discharge instructions, follow up appointment and new medications.  Prescription given prior to discharge.

## 2016-08-04 NOTE — Anesthesia Procedure Notes (Signed)
Procedure Name: Intubation Date/Time: 08/04/2016 7:56 AM Performed by: Lissa Morales Pre-anesthesia Checklist: Patient identified, Emergency Drugs available, Suction available and Patient being monitored Patient Re-evaluated:Patient Re-evaluated prior to inductionOxygen Delivery Method: Circle system utilized Preoxygenation: Pre-oxygenation with 100% oxygen Intubation Type: IV induction Ventilation: Mask ventilation without difficulty Laryngoscope Size: Mac and 3 Grade View: Grade I Tube type: Oral Tube size: 7.5 mm Number of attempts: 1 Airway Equipment and Method: Stylet and Oral airway Placement Confirmation: ETT inserted through vocal cords under direct vision,  positive ETCO2 and breath sounds checked- equal and bilateral Tube secured with: Tape Dental Injury: Teeth and Oropharynx as per pre-operative assessment

## 2016-08-04 NOTE — Discharge Instructions (Signed)
LAPAROSCOPIC SURGERY: POST OP INSTRUCTIONS  ######################################################################  EAT Gradually transition to a high fiber diet with a fiber supplement over the next few weeks after discharge.  Start with a pureed / full liquid diet (see below)  WALK Walk an hour a day.  Control your pain to do that.    CONTROL PAIN Control pain so that you can walk, sleep, tolerate sneezing/coughing, go up/down stairs.  HAVE A BOWEL MOVEMENT DAILY Keep your bowels regular to avoid problems.  OK to try a laxative to override constipation.  OK to use an antidairrheal to slow down diarrhea.  Call if not better after 2 tries  CALL IF YOU HAVE PROBLEMS/CONCERNS Call if you are still struggling despite following these instructions. Call if you have concerns not answered by these instructions  ######################################################################    1. DIET: Follow a light bland diet the first 24 hours after arrival home, such as soup, liquids, crackers, etc.  Be sure to include lots of fluids daily.  Avoid fast food or heavy meals as your are more likely to get nauseated.  Eat a low fat the next few days after surgery.   2. Take your usually prescribed home medications unless otherwise directed. 3. PAIN CONTROL: a. Pain is best controlled by a usual combination of three different methods TOGETHER: i. Ice/Heat ii. Over the counter pain medication iii. Prescription pain medication b. Most patients will experience some swelling and bruising around the incisions.  Ice packs or heating pads (30-60 minutes up to 6 times a day) will help. Use ice for the first few days to help decrease swelling and bruising, then switch to heat to help relax tight/sore spots and speed recovery.  Some people prefer to use ice alone, heat alone, alternating between ice & heat.  Experiment to what works for you.  Swelling and bruising can take several weeks to resolve.   c. It is  helpful to take an over-the-counter pain medication regularly for the first few weeks.  Choose one of the following that works best for you: i. Naproxen (Aleve, etc)  Two 220mg  tabs twice a day ii. Ibuprofen (Advil, etc) Three 200mg  tabs four times a day (every meal & bedtime) iii. Acetaminophen (Tylenol, etc) 500-650mg  four times a day (every meal & bedtime) d. A  prescription for pain medication (such as oxycodone, hydrocodone, etc) should be given to you upon discharge.  Take your pain medication as prescribed.  i. If you are having problems/concerns with the prescription medicine (does not control pain, nausea, vomiting, rash, itching, etc), please call us [phone] to see if we need to switch you to a different pain medicine that will work better for you and/or control your side effect better. ii. If you need a refill on your pain medication, please contact your pharmacy.  They will contact our office to request authorization. Prescriptions will not be filled after 5 pm or on week-ends. 4. Avoid getting constipated.  Between the surgery and the pain medications, it is common to experience some constipation.  Increasing fluid intake and taking a fiber supplement (such as Metamucil, Citrucel, FiberCon, MiraLax, etc) 1-2 times a day regularly will usually help prevent this problem from occurring.  A mild laxative (prune juice, Milk of Magnesia, MiraLax, etc) should be taken according to package directions if there are no bowel movements after 48 hours.   5. Watch out for diarrhea.  If you have many loose bowel movements, simplify your diet to bland foods & liquids for  a few days.  Stop any stool softeners and decrease your fiber supplement.  Switching to mild anti-diarrheal medications (Kayopectate, Pepto Bismol) can help.  If this worsens or does not improve, please call us. 6. Wash / shower every day.  You may shower over the dressings as they are waterproof.  Continue to shower over incision(s)  after the dressing is off. 7. Remove your waterproof bandages 5 days after surgery.  You may leave the incision open to air.  You may replace a dressing/Band-Aid to cover the incision for comfort if you wish.  8. ACTIVITIES as tolerated:   a. You may resume regular (light) daily activities beginning the next day--such as daily self-care, walking, climbing stairs--gradually increasing activities as tolerated.  If you can walk 30 minutes without difficulty, it is safe to try more intense activity such as jogging, treadmill, bicycling, low-impact aerobics, swimming, etc. b. Save the most intensive and strenuous activity for last such as sit-ups, heavy lifting, contact sports, etc  Refrain from any heavy lifting or straining until you are off narcotics for pain control.   c. DO NOT PUSH THROUGH PAIN.  Let pain be your guide: If it hurts to do something, don't do it.  Pain is your body warning you to avoid that activity for another week until the pain goes down. d. You may drive when you are no longer taking prescription pain medication, you can comfortably wear a seatbelt, and you can safely maneuver your car and apply brakes. e. Dennis Bast may have sexual intercourse when it is comfortable.  9. FOLLOW UP in our office a. Please call [facility] at [phone] to set up an appointment to see your surgeon in the office for a follow-up appointment approximately 2-3 weeks after your surgery. b. Make sure that you call for this appointment the day you arrive home to insure a convenient appointment time. 10. IF YOU HAVE DISABILITY OR FAMILY LEAVE FORMS, BRING THEM TO THE OFFICE FOR PROCESSING.  DO NOT GIVE THEM TO YOUR DOCTOR.   WHEN TO CALL us [phone]: 1. Poor pain control 2. Reactions / problems with new medications (rash/itching, nausea, etc)  3. Fever over 101.5 F (38.5 C) 4. Inability to urinate 5. Nausea and/or vomiting 6. Worsening swelling or bruising 7. Continued bleeding from incision. 8. Increased  pain, redness, or drainage from the incision   The clinic staff is available to answer your questions during regular business hours (8:30am-5pm).  Please dont hesitate to call and ask to speak to one of our nurses for clinical concerns.   If you have a medical emergency, go to the nearest emergency room or call 911.  A surgeon from [facility] is always on call at the [facility], [place], [place], [place], [place]  [address] ? MAIN: [phone] ? TOLL FREE: [phone] ?  FAX [phone] [email or web address]    Appendicitis Appendicitis is when the appendix is swollen (inflamed). The inflammation can lead to developing a hole (perforation) and a collection of pus (abscess). CAUSES  There is not always an obvious cause of appendicitis. Sometimes it is caused by an obstruction in the appendix. The obstruction can be caused by:  A small, hard, pea-sized ball of stool (fecalith).  Enlarged lymph glands in the appendix. SYMPTOMS   Pain around your belly button (navel) that moves toward your lower right belly (abdomen). The pain can become more severe and sharp as time passes.  Tenderness  in the lower right abdomen. Pain gets worse if you cough or make a sudden movement.  Feeling sick to your stomach (nauseous).  Throwing up (vomiting).  Loss of appetite.  Fever.  Constipation.  Diarrhea.  Generally not feeling well. DIAGNOSIS   Physical exam.  Blood tests.  Urine test.  X-rays or a CT scan may confirm the diagnosis. TREATMENT  Once the diagnosis of appendicitis is made, the most common treatment is to remove the appendix as soon as possible. This procedure is called appendectomy. In an open appendectomy, a cut (incision) is made in the lower right abdomen and the appendix is removed. In a laparoscopic appendectomy, usually 3 small incisions are made. Long, thin instruments and a camera tube are  used to remove the appendix. Most patients go home in 24 to 48 hours after appendectomy. In some situations, the appendix may have already perforated and an abscess may have formed. The abscess may have a "wall" around it as seen on a CT scan. In this case, a drain may be placed into the abscess to remove fluid, and you may be treated with antibiotic medicines that kill germs. The medicine is given through a tube in your vein (IV). Once the abscess has resolved, it may or may not be necessary to have an appendectomy. You may need to stay in the hospital longer than 48 hours.   This information is not intended to replace advice given to you by your health care provider. Make sure you discuss any questions you have with your health care provider.   Document Released: 10/29/2005 Document Revised: 04/29/2012 Document Reviewed: 03/16/2015 Elsevier Interactive Patient Education [employer].

## 2016-08-04 NOTE — Transfer of Care (Signed)
Immediate Anesthesia Transfer of Care Note  Patient: Anne [name]  Procedure(s) Performed: Procedure(s): APPENDECTOMY LAPAROSCOPIC (N/A)  Patient Location: PACU  Anesthesia Type:General  Level of Consciousness: awake, alert , oriented, sedated and patient cooperative  Airway & Oxygen Therapy: Patient Spontanous Breathing and Patient connected to face mask oxygen  Post-op Assessment: Report given to RN, Post -op Vital signs reviewed and stable and Patient moving all extremities X 4  Post vital signs: stable  Last Vitals:  Vitals:   08/04/16 0905 08/04/16 0915  BP: (!) 105/53 (!) 98/54  Pulse: 87 87  Resp: 14 12  Temp: 36.4 C     Last Pain:  Vitals:   08/04/16 0915  TempSrc:   PainSc: Asleep      Patients Stated Pain Goal: 2 ([id number])  Complications: No apparent anesthesia complications

## 2016-08-04 NOTE — Op Note (Signed)
9:04 AM  PATIENT:  Anne [name]  75 y.o. female  Patient Care Team: [name], MD as PCP - General (Family Medicine) Gentry Fitz, MD as Consulting Physician (Family Medicine) Amada Kingfisher, MD (Inactive) as Consulting Physician (Hematology and Oncology) Paralee Cancel, MD as Consulting Physician (Orthopedic Surgery) Fay Records, MD as Consulting Physician (Cardiology) Gordy Levan, MD as Consulting Physician (Oncology) Gatha Mayer, MD as Consulting Physician (Gastroenterology)  PRE-OPERATIVE DIAGNOSIS:  Appendicitis  POST-OPERATIVE DIAGNOSIS:  Acute suppurative appendicitis  PROCEDURE:  : APPENDECTOMY LAPAROSCOPIC  SURGEON:  Surgeon(s): Michael Boston, MD  ANESTHESIA:   local and general  EBL:  No intake/output data recorded.  Delay start of Pharmacological VTE agent (>24hrs) due to surgical blood loss or risk of bleeding:  no  DRAINS: none   SPECIMEN:  Source of Specimen:  APPENDIX  DISPOSITION OF SPECIMEN:  PATHOLOGY  COUNTS:  YES  PLAN OF CARE: Admit for overnight observation  PATIENT DISPOSITION:  PACU - hemodynamically stable.   INDICATIONS: Patient with concerning symptoms & work up suspicious for appendicitis.  Surgery was recommended:  The anatomy & physiology of the digestive tract was discussed.  The pathophysiology of appendicitis was discussed.  Natural history risks without surgery was discussed.   I feel the risks of no intervention will lead to serious problems that outweigh the operative risks; therefore, I recommended diagnostic laparoscopy with removal of appendix to remove the pathology.  Laparoscopic & open techniques were discussed.   I noted a good likelihood this will help address the problem.    Risks such as bleeding, infection, abscess, leak, reoperation, possible ostomy, hernia, heart attack, death, and other risks were discussed.  Goals of post-operative recovery were discussed as well.  We will work to minimize  complications.  Questions were answered.  The patient expresses understanding & wishes to proceed with surgery.  OR FINDINGS: Patient had a long retrocecal appendix with inflammation and mild phlegmon at the tip consistent with early suppurative appendicitis.  No obvious abscess.  No interloop adhesions.  Mild omental and colon adhesions in the right upper quadrant consistent with prior open cholecystectomy.  Liver normal without fatty change nor cirrhosis.  No evidence of colon diverticulitis.  No evidence of any hernias.  DESCRIPTION:   The patient was identified & brought into the operating room. The patient was positioned supine with arms tucked. SCDs were active during the entire case. The patient underwent general anesthesia without any difficulty.  The abdomen was prepped and draped in a sterile fashion. A Surgical Timeout confirmed our plan.  I made a transverse incision through the superior umbilical fold.  I made a small transverse nick through the infraumbilical fascia and confirmed peritoneal entry.  I placed a 53mm port.  We induced carbon dioxide insufflation.  Camera inspection revealed no injury.  Omental and colon adhesions noted upper midline and right upper quadrant.  I placed additional ports under direct laparoscopic visualization.  I mobilized the ascending colon in a lateral to medial fashion.  I took care to avoid injuring any retroperitoneal structures.   It was able to find the cecum and noted a retrocecal appendix adherent to the cecal mesentery. I freed the appendix off its attachments to the ascending colon and cecal mesentery.  I elevated the appendix. I skeletonized the mesoappendix. I was able to free off the base of the appendix which was still viable.  I stapled the appendix off the cecum using a laparoscopic stapler. I took  a healthy cuff of viable cecum. I ligated the mesoappendix and assured hemostasis in the mesentery.  I placed the appendix inside an EndoCatch bag  and removed out the 12 mm port.  I did copious irrigation. Hemostasis was good in the mesoappendix, colon mesentery, and retroperitoneum. Staple line was intact on the cecum with no bleeding. I washed out the pelvis, retrohepatic space and right paracolic gutter. I washed out the left side as well.  Hemostasis is good. There was no perforation or injury.  Because the area cleaned up well after irrigation, I did not place a drain.  I aspirated the carbon dioxide. I removed the ports. I closed the 12 mm fascia site using a #1 PDS stitch with a lap or scopic suture passer under direct visualization. I closed skin using 4-0 monocryl stitch.  Patient was extubated and sent to the recovery room.  I discussed the operative findings with the patient's husband. I suspect the patient is going used in the hospital at least overnight and will need  no more additional antibiotics. Questions answered. He expressed understanding and appreciation.  Adin Hector, M.D., F.A.C.S. Gastrointestinal and Minimally Invasive Surgery [facility] [address] [place], [place] [address] [phone] Main / Paging

## 2016-08-04 NOTE — Anesthesia Preprocedure Evaluation (Addendum)
Anesthesia Evaluation  Patient identified by MRN, date of birth, ID band Patient awake    Reviewed: Allergy & Precautions, NPO status , Patient's Chart, lab work & pertinent test results  Airway Mallampati: II       Dental   Pulmonary shortness of breath, pneumonia, COPD, former smoker,    breath sounds clear to auscultation       Cardiovascular + Peripheral Vascular Disease   Rhythm:Regular Rate:Normal     Neuro/Psych    GI/Hepatic Neg liver ROS, GERD  ,  Endo/Other  negative endocrine ROS  Renal/GU negative Renal ROS     Musculoskeletal  (+) Arthritis ,   Abdominal   Peds  Hematology  (+) anemia ,   Anesthesia Other Findings   Reproductive/Obstetrics                            Anesthesia Physical Anesthesia Plan  ASA: III  Anesthesia Plan: General   Post-op Pain Management:    Induction: Intravenous  Airway Management Planned: Oral ETT  Additional Equipment:   Intra-op Plan:   Post-operative Plan: Possible Post-op intubation/ventilation  Informed Consent: I have reviewed the patients History and Physical, chart, labs and discussed the procedure including the risks, benefits and alternatives for the proposed anesthesia with the patient or authorized representative who has indicated his/her understanding and acceptance.   Dental advisory given  Plan Discussed with: CRNA and Anesthesiologist  Anesthesia Plan Comments:         Anesthesia Quick Evaluation

## 2016-08-04 NOTE — Progress Notes (Signed)
Nurse paged provider on call due to antibiotics ordered are not compatible with lactated ringers. Dr. Johney Maine returned nurses call. Dr. Johney Maine gave verbal orders for fluids to be changed to normal saline at 147ml/hr. Nurse discontinued lactated ringers order.

## 2016-08-04 NOTE — Progress Notes (Signed)
Patient has been wiped down with CHG wipes.

## 2016-08-04 NOTE — Anesthesia Postprocedure Evaluation (Signed)
Anesthesia Post Note  Patient: Anne [name]  Procedure(s) Performed: Procedure(s) (LRB): APPENDECTOMY LAPAROSCOPIC (N/A)  Patient location during evaluation: PACU Anesthesia Type: General Level of consciousness: awake Pain management: pain level controlled Vital Signs Assessment: post-procedure vital signs reviewed and stable Respiratory status: spontaneous breathing Cardiovascular status: stable Anesthetic complications: no    Last Vitals:  Vitals:   08/04/16 0915 08/04/16 0930  BP: (!) 98/54 (!) 99/54  Pulse: 87 86  Resp: 12 12  Temp:      Last Pain:  Vitals:   08/04/16 0915  TempSrc:   PainSc: Asleep                 EDWARDS,Jaina Morin

## 2016-08-05 ENCOUNTER — Encounter: Payer: Self-pay | Admitting: Internal Medicine

## 2016-08-05 ENCOUNTER — Other Ambulatory Visit: Payer: Self-pay | Admitting: Internal Medicine

## 2016-08-05 NOTE — Discharge Summary (Signed)
Physician Discharge Summary  Patient ID: Anne [name] MRN: [id number] DOB/AGE: 1941/04/14 75 y.o.  Admit date: 08/03/2016 Discharge date: 08/05/2016  Patient Care Team: Bradd Canary, MD as PCP - General (Family Medicine) Otho Darner, MD as Consulting Physician (Family Medicine) Chauncy Passy, MD (Inactive) as Consulting Physician (Hematology and Oncology) Durene Romans, MD as Consulting Physician (Orthopedic Surgery) Pricilla Riffle, MD as Consulting Physician (Cardiology) Reece Packer, MD as Consulting Physician (Oncology) [name], MD as Consulting Physician (Gastroenterology)  Admission Diagnoses: Principal Problem:   Acute appendicitis s/p lap appendectomy 08/04/2016 Active Problems:   Chronic alcoholism in remission [facility])   Anxiety and depression   Discharge Diagnoses:  Principal Problem:   Acute appendicitis s/p lap appendectomy 08/04/2016 Active Problems:   Chronic alcoholism in remission (HCC)   Anxiety and depression   POST-OPERATIVE DIAGNOSIS:   supprative appendicitis  SURGERY: 08/04/2016  Procedure(s): APPENDECTOMY LAPAROSCOPIC  SURGEON:    Surgeon(s): Karie Soda, MD  Consults: None  Hospital Course:   The patient had appendicitis underwent the surgery above.  Postoperatively, the patient gradually mobilized and advanced to a solid diet.  Pain and other symptoms were treated aggressively.    By the time of discharge, the patient was walking well the hallways, eating food, having flatus.  Pain was well-controlled on an oral medications.  Based on meeting discharge criteria and continuing to recover, I felt it was safe for the patient to be discharged from the hospital to further recover with close followup. Postoperative recommendations were discussed in detail.  They are written as well.   Significant Diagnostic Studies:  Results for orders placed or performed during the hospital encounter of 08/03/16 (from the past 72 hour(s))  CBC  with Differential/Platelet     Status: Abnormal   Collection Time: 08/03/16  3:59 PM  Result Value Ref Range   WBC 11.0 (H) 4.0 - 10.5 K/uL   RBC 3.85 (L) 3.87 - 5.11 MIL/uL   Hemoglobin 12.1 12.0 - 15.0 g/dL   HCT 66.0 63.0 - 16.0 %   MCV 97.7 78.0 - 100.0 fL   MCH 31.4 26.0 - 34.0 pg   MCHC 32.2 30.0 - 36.0 g/dL   RDW 10.9 32.3 - 55.7 %   Platelets 250 150 - 400 K/uL   Neutrophils Relative % 80 %   Neutro Abs 8.8 (H) 1.7 - 7.7 K/uL   Lymphocytes Relative 13 %   Lymphs Abs 1.4 0.7 - 4.0 K/uL   Monocytes Relative 6 %   Monocytes Absolute 0.7 0.1 - 1.0 K/uL   Eosinophils Relative 1 %   Eosinophils Absolute 0.1 0.0 - 0.7 K/uL   Basophils Relative 0 %   Basophils Absolute 0.0 0.0 - 0.1 K/uL  Basic metabolic panel     Status: None   Collection Time: 08/03/16  3:59 PM  Result Value Ref Range   Sodium 138 135 - 145 mmol/L   Potassium 3.9 3.5 - 5.1 mmol/L   Chloride 104 101 - 111 mmol/L   CO2 28 22 - 32 mmol/L   Glucose, Bld 97 65 - 99 mg/dL   BUN 15 6 - 20 mg/dL   Creatinine, Ser 3.22 0.44 - 1.00 mg/dL   Calcium 9.2 8.9 - 02.5 mg/dL   GFR calc non Af Amer >60 >60 mL/min   GFR calc Af Amer >60 >60 mL/min    Comment: (NOTE) The eGFR has been calculated using the CKD EPI equation. This calculation has not been validated in  all clinical situations. eGFR's persistently <60 mL/min signify possible Chronic Kidney Disease.    Anion gap 6 5 - 15  Surgical PCR screen     Status: None   Collection Time: 08/04/16  2:36 AM  Result Value Ref Range   MRSA, PCR NEGATIVE NEGATIVE   Staphylococcus aureus NEGATIVE NEGATIVE    Comment:        The Xpert SA Assay (FDA approved for NASAL specimens in patients over 59 years of age), is one component of a comprehensive surveillance program.  Test performance has been validated by [facility] for patients greater than or equal to 79 year old. It is not intended to diagnose infection nor to guide or monitor treatment.     Ct Abdomen  Pelvis W Contrast  Result Date: 08/03/2016 CLINICAL DATA:  Constipation, left upper quadrant abdominal pain, history of cholecystectomy and right breast cancer, evaluate for diverticulitis EXAM: CT ABDOMEN AND PELVIS WITH CONTRAST TECHNIQUE: Multidetector CT imaging of the abdomen and pelvis was performed using the standard protocol following bolus administration of intravenous contrast. CONTRAST:  ISOVUE-300 IOPAMIDOL (ISOVUE-300) INJECTION 61% COMPARISON:  07/03/2013 FINDINGS: Lower chest: Mild linear scarring in the lingula. Status post right mastectomy. Hepatobiliary: Liver is within normal limits. Status post cholecystectomy. No intrahepatic ductal dilatation. Dilated common duct, measuring 10 mm, and smoothly tapering at the ampulla. Pancreas: Within normal limits. Spleen: Within normal limits. Adrenals/Urinary Tract: Adrenal glands within normal limits. Kidneys are within normal limits.  No hydronephrosis. Bladder is within normal limits. Stomach/Bowel: Stomach is within normal limits. No evidence of bowel obstruction. Dilated distal appendix, measuring up to 10 mm, with associated periappendiceal inflammatory changes (series 2/ image 51), reflecting acute appendicitis. No pericolonic inflammatory changes to suggest diverticulitis. Vascular/Lymphatic: No evidence of abdominal aortic aneurysm. Atherosclerotic calcifications of the abdominal aorta and branch vessels. No suspicious abdominopelvic lymphadenopathy. Reproductive: Uterus is within normal limits. Bilateral ovaries are within normal limits. Other: No abdominopelvic ascites. No drainable fluid collection/ abscess.  No free air. Musculoskeletal: Mild degenerative changes at L4-5. IMPRESSION: Acute appendicitis. No drainable fluid collection/abscess. No free air. Electronically Signed   By: Charline Bills M.D.   On: 08/03/2016 18:43   Dg Abd 2 Views  Result Date: 08/01/2016 CLINICAL DATA:  Constipation. EXAM: ABDOMEN - 2 VIEW COMPARISON:   CT 07/03/2013 . FINDINGS: Soft tissue structures are unremarkable. No bowel distention or free air. Several nonspecific air-filled loops of small bowel are noted. Mild thickening of the small bowel folds cannot be excluded. Process such as enteritis cannot be excluded. Moderate amount of stool noted throughout the colon. Surgical clips right upper quadrant. Aortoiliac atherosclerotic vascular calcification. Degenerative changes lumbar spine and both hips. IMPRESSION: 1. Moderate amount stool noted throughout the colon. Constipation cannot be excluded. 2. Several nonspecific air-filled loops of small bowel are noted. No bowel distention noted. There is questionable mild thickening of the visualized small bowel folds. A process such as enteritis cannot be entirely excluded . No free air. 3.  Aortoiliac atherosclerotic vascular disease. Electronically Signed   By: Maisie Fus  Register   On: 08/01/2016 16:57    Discharge Exam: Blood pressure 109/62, pulse (!) 55, temperature 98.6 F (37 C), temperature source Oral, resp. rate 16, height 5\' 6"  (1.676 m), weight 59.9 kg (132 lb), SpO2 93 %.  General: Pt awake/alert/oriented x4 in no major acute distress Eyes: PERRL, normal EOM. Sclera nonicteric Neuro: CN II-XII intact w/o focal sensory/motor deficits. Lymph: No head/neck/groin lymphadenopathy Psych:  No delerium/psychosis/paranoia HENT: Normocephalic,  Mucus membranes moist.  No thrush Neck: Supple, No tracheal deviation Chest: No pain.  Good respiratory excursion. CV:  Pulses intact.  Regular rhythm MS: Normal AROM mjr joints.  No obvious deformity Abdomen: Soft, Nondistended.  Mild TTP at incisions,, otherwise nontender.  No incarcerated hernias. Ext:  SCDs BLE.  No significant edema.  No cyanosis Skin: No petechiae / purpura  Discharged Condition: good   Past Medical History:  Diagnosis Date  . ALKALINE PHOSPHATASE, ELEVATED 03/15/2009  . Allergic state 06/10/2012  . Anemia   . Anxiety and  depression 04/28/2011  . Arthritis   . Atypical chest pain 11/30/2011  . 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 Baker's cyst of knee 05/22/2011  . Cancer (HCC) 01,  08   XRT/chemo 01-02/ lobular invasive ca  . Carotid artery disease (HCC)    a. Carotid duplex 03/2014: stable 1-39% BICA, f/u due 03/2016.  [name] 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   Did not tolerate Klonopin, caused some confusion and bad dreams.    [name] COPD (chronic obstructive pulmonary disease) (HCC)   . Depression   . Dermatitis 11/23/2012  . Dysphagia   . EE (eosinophilic esophagitis)   . Elevated sed rate 08/02/2013  . Esophageal ring   . ESOPHAGEAL STRICTURE 03/29/2009  . Fall 11/23/2012  . Folliculitis of nose 12/31/2011  . GERD (gastroesophageal reflux disease) 09/29/2009   improved s/p cholecystectomy and esophagus dilatation  . Hematuria   . History of chicken pox   . History of measles   . History of shingles    2 episodes  . Hx of echocardiogram    a. Echo 01/2013: mild LVH, EF 55-60%, normal wall motion, Gr 1 diast dysfn  . Hyperlipidemia   . Knee pain, bilateral 07/23/2011  . Medicare annual wellness visit, subsequent 06/19/2015  . Mixed hyperlipidemia 10/17/2010   Qualifier: Diagnosis of  By: Omar Person    . Orthostasis   . Osteopenia 03/14/2011  . 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 Pneumonia 2-10   pleurisy  . Preventative health care 10/21/2012  . PVC's (premature ventricular contractions)    a. Event monitor 01/2013: NSR, extensive PVCs.  . Radial neck fracture 10/2011   minimally displaced  . Sinusitis, acute 12/04/2015  . Trauma 10/18/2011  . URI (upper respiratory infection) 09/02/2012  . Urinary frequency 12/12/2014  . Urinary incontinence 03/19/2012  . Vaginitis 05/22/2011    Past Surgical History:  Procedure Laterality Date  . ANTERIOR CRUCIATE LIGAMENT REPAIR  08, 09, 10  . BREAST LUMPECTOMY  2001  . BREAST RECONSTRUCTION  2008, 2009, 2010  .  CHOLECYSTECTOMY  2010  . COLONOSCOPY  09/05/10   [facility]  . ERCP  2010    CBD stone extraction   . ESOPHAGOGASTRODUODENOSCOPY  01/08/2012   Procedure: ESOPHAGOGASTRODUODENOSCOPY (EGD);  Surgeon: [name], MD;  Location: [facility];  Service: Endoscopy;  Laterality: N/A;  . ESOPHAGOGASTRODUODENOSCOPY (EGD) WITH ESOPHAGEAL DILATION  2010, 2012  . MASTECTOMY MODIFIED RADICAL     , Mastectomy modified radical (08), breast reconstruction, CA lesions excised lateral abd wall 2010  . SAVORY DILATION  01/08/2012   Procedure: SAVORY DILATION;  Surgeon: [name], MD;  Location: [facility];  Service: Endoscopy;  Laterality: N/A;  need xray    Social History   Social History  . Marital status: Married    Spouse name: N/A  . Number of children: N/A  . Years of education: N/A  Occupational History  . Not on file.   Social History Main Topics  . Smoking status: Former Smoker    Types: Cigarettes    Quit date: 05/22/2007  . Smokeless tobacco: Never Used  . Alcohol use 0.6 oz/week    1 Glasses of wine per week     Comment: at least 6 oz daily  . Drug use: No  . Sexual activity: Yes    Partners: Male     Comment: lives with husband,    Other Topics Concern  . Not on file   Social History Narrative  . No narrative on file    Family History  Problem Relation Age of Onset  . Heart disease Father   . Lung cancer Father     smoker  . Cirrhosis Sister     Primary Biliary  . Stroke Maternal Grandmother   . Alcohol abuse Maternal Grandfather   . Heart disease Paternal Grandfather   . Anxiety disorder Sister   . Osteoporosis Sister   . Arthritis Sister     Rheumatoid  . Osteoporosis Sister   . Skin cancer Sister     multiple skin cancers, over 90 excisions.  . Other Mother     tic [place]  . Breast cancer Maternal Aunt     x 3  . Breast cancer Paternal Aunt   . Anesthesia problems Neg Hx   . Hypotension Neg Hx   . Malignant hyperthermia Neg Hx   .  Pseudochol deficiency Neg Hx   . Colon cancer Neg Hx   . Ovarian cancer Neg Hx   . Pancreatic cancer Neg Hx     No current facility-administered medications for this encounter.    Current Outpatient Prescriptions  Medication Sig Dispense Refill  . acetaminophen (TYLENOL) 500 MG tablet Take 500-1,000 mg by mouth every 6 (six) hours as needed (for pain.).    [name] amitriptyline (ELAVIL) 150 MG tablet take 1 tablet by mouth at bedtime 90 tablet 1  . aspirin EC 81 MG tablet Take 81 mg by mouth daily.    [name] atorvastatin (LIPITOR) 40 MG tablet TAKE 1 TABLET EVERY DAY 90 tablet 3  . Cholecalciferol (VITAMIN D3) 1000 UNITS CAPS Take 2 tablets by mouth daily.      . Cyanocobalamin (VITAMIN B 12 PO) Take 1 tablet by mouth daily.    [name] diltiazem (CARDIZEM) 30 MG tablet Take 1 tablet (30 mg total) by mouth daily as needed (for palpitations). (Patient taking differently: Take 30 mg by mouth every evening. ) 30 tablet 4  . ferrous sulfate 325 (65 FE) MG tablet Take 325 mg by mouth daily with breakfast.    . ibuprofen (ADVIL,MOTRIN) 200 MG tablet Take 400-800 mg by mouth every 8 (eight) hours as needed (for pain.).    [name] LORazepam (ATIVAN) 1 MG tablet Take 1 tablet (1 mg total) by mouth every 8 (eight) hours as needed for anxiety. 70 tablet 2  . pantoprazole (PROTONIX) 40 MG tablet Take 1 tablet (40 mg total) by mouth daily. 30 tablet 3  . Probiotic Product (PROBIOTIC FORMULA PO) Take 1 capsule by mouth daily.    . traMADol (ULTRAM) 50 MG tablet Take 1-2 tablets (50-100 mg total) by mouth every 6 (six) hours as needed for moderate pain or severe pain. 30 tablet 0     Allergies  Allergen Reactions  . Codeine Other (See Comments)    "flu like symptoms"  . Morphine Other (See Comments)    "flu like  symptoms"  . Peanut-Containing Drug Products Hives  . Sorbitol Other (See Comments)    GI Issues  . Tomato Diarrhea  . Zofran Other (See Comments)    headache  . Diphenhydramine Hcl Other (See Comments)     "nervous and upset"    Disposition: 01-Home or Self Care  Discharge Instructions    Call MD for:    Complete by:  As directed    Temperature > 101.27F   Call MD for:  extreme fatigue    Complete by:  As directed    Call MD for:  hives    Complete by:  As directed    Call MD for:  persistant nausea and vomiting    Complete by:  As directed    Call MD for:  redness, tenderness, or signs of infection (pain, swelling, redness, odor or green/yellow discharge around incision site)    Complete by:  As directed    Call MD for:  severe uncontrolled pain    Complete by:  As directed    Diet - low sodium heart healthy    Complete by:  As directed    Start with bland, low residue diet for a few days, then advance to a heart healthy (low fat, high fiber) diet.  If you feel nauseated or constipated, simplify to a liquid only diet for 48 hours until you are feeling better (no more nausea, farting/passing gas, having a bowel movement, etc...).  If you cannot tolerate even drinking liquids, or feeling worse, let your surgeon know or go to the Emergency Department for help.   Discharge instructions    Complete by:  As directed    Please see discharge instruction sheets.   Also refer to any handouts/printouts that may have been given from the [facility] surgery office (if you visited [place] there before surgery) Please call our office if you have any questions or concerns [phone]   Discharge wound care:    Complete by:  As directed    If you have closed incisions: Shower and bathe over these incisions with soap and water every day.  It is OK to wash over the dressings: they are waterproof. Remove all surgical dressings on postoperative day #3.  You do not need to replace dressings over the closed incisions unless you feel more comfortable with a Band-Aid covering it.   If you have an open wound: That requires packing, so please see wound care instructions.   In general, remove all dressings, wash wound  with soap and water and then replace with saline moistened gauze.  Do the dressing change at least every day.    Please call our office [phone] if you have further questions.   Driving Restrictions    Complete by:  As directed    No driving until off narcotics and can safely swerve away without pain during an emergency   Increase activity slowly    Complete by:  As directed    Lifting restrictions    Complete by:  As directed    Avoid heavy lifting initially, <20 pounds at first.   Do not push through pain.   You have no specific weight limit: If it hurts to do, DON'T DO IT.    If you feel no pain, you are not injuring anything.  Pain will protect you from injury.   Coughing and sneezing are far more stressful to your incision than any lifting.   Avoid resuming heavy lifting (>50 pounds) or other intense  activity until off all narcotic pain medications.   When want to exercise more, give yourself 2 weeks to gradually get back to full intense exercise/activity.   May shower / Bathe    Complete by:  As directed    SHOWER EVERY DAY.  It is fine for dressings or wounds to be washed/rinsed.  Use gentle soap & water.  This will help the incisions and/or wounds get clean & minimize infection.   May walk up steps    Complete by:  As directed    Sexual Activity Restrictions    Complete by:  As directed    Sexual activity as tolerated.  Do not push through pain.  Pain will protect you from injury.   Walk with assistance    Complete by:  As directed    Walk over an hour a day.  May use a walker/cane/companion to help with balance and stamina.       Medication List    TAKE these medications   acetaminophen 500 MG tablet Commonly known as:  TYLENOL Take 500-1,000 mg by mouth every 6 (six) hours as needed (for pain.).   amitriptyline 150 MG tablet Commonly known as:  ELAVIL take 1 tablet by mouth at bedtime   aspirin EC 81 MG tablet Take 81 mg by mouth daily.   atorvastatin 40 MG  tablet Commonly known as:  LIPITOR TAKE 1 TABLET EVERY DAY   diltiazem 30 MG tablet Commonly known as:  CARDIZEM Take 1 tablet (30 mg total) by mouth daily as needed (for palpitations). What changed:  when to take this   ferrous sulfate 325 (65 FE) MG tablet Take 325 mg by mouth daily with breakfast.   ibuprofen 200 MG tablet Commonly known as:  ADVIL,MOTRIN Take 400-800 mg by mouth every 8 (eight) hours as needed (for pain.).   LORazepam 1 MG tablet Commonly known as:  ATIVAN Take 1 tablet (1 mg total) by mouth every 8 (eight) hours as needed for anxiety.   pantoprazole 40 MG tablet Commonly known as:  PROTONIX Take 1 tablet (40 mg total) by mouth daily.   PROBIOTIC FORMULA PO Take 1 capsule by mouth daily.   traMADol 50 MG tablet Commonly known as:  ULTRAM Take 1-2 tablets (50-100 mg total) by mouth every 6 (six) hours as needed for moderate pain or severe pain.   VITAMIN B 12 PO Take 1 tablet by mouth daily.   Vitamin D3 1000 units Caps Take 2 tablets by mouth daily.      Follow-up Information    Harman Ferrin C., MD. Schedule an appointment as soon as possible for a visit in 3 weeks.   Specialty:  General Surgery Why:  To follow up after your operation, To follow up after your hospital stay Contact information: [address] [place] [address] [phone]            Signed: Lorenso Courier, M.D., F.A.C.S. Gastrointestinal and Minimally Invasive Surgery [facility] [address] [place], [place] [address] [phone] Main / Paging   08/05/2016, 9:39 AM

## 2016-08-06 ENCOUNTER — Encounter (HOSPITAL_COMMUNITY): Payer: Self-pay | Admitting: Surgery

## 2016-08-13 ENCOUNTER — Encounter: Payer: Self-pay | Admitting: Genetic Counselor

## 2016-08-13 ENCOUNTER — Other Ambulatory Visit: Payer: Commercial Managed Care - HMO

## 2016-08-13 ENCOUNTER — Ambulatory Visit (HOSPITAL_BASED_OUTPATIENT_CLINIC_OR_DEPARTMENT_OTHER): Payer: Commercial Managed Care - HMO | Admitting: Genetic Counselor

## 2016-08-13 ENCOUNTER — Encounter: Payer: Self-pay | Admitting: Family Medicine

## 2016-08-13 DIAGNOSIS — C792 Secondary malignant neoplasm of skin: Principal | ICD-10-CM

## 2016-08-13 DIAGNOSIS — C50911 Malignant neoplasm of unspecified site of right female breast: Secondary | ICD-10-CM | POA: Diagnosis not present

## 2016-08-13 DIAGNOSIS — Z800 Family history of malignant neoplasm of digestive organs: Secondary | ICD-10-CM

## 2016-08-13 DIAGNOSIS — Z853 Personal history of malignant neoplasm of breast: Secondary | ICD-10-CM

## 2016-08-13 DIAGNOSIS — Z8041 Family history of malignant neoplasm of ovary: Secondary | ICD-10-CM | POA: Diagnosis not present

## 2016-08-13 DIAGNOSIS — Z315 Encounter for genetic counseling: Secondary | ICD-10-CM

## 2016-08-13 DIAGNOSIS — Z803 Family history of malignant neoplasm of breast: Secondary | ICD-10-CM | POA: Diagnosis not present

## 2016-08-13 NOTE — Progress Notes (Signed)
REFERRING PROVIDER: [name], MD [address] [place], [place] [address]   Anne Clines, MD  PRIMARY PROVIDER:  Penni Homans, MD  PRIMARY REASON FOR VISIT:  1. 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 Family history of breast cancer   3. Family history of ovarian cancer   4. Family history of pancreatic cancer   5. Family history of colon cancer   6. History of breast cancer in female      HISTORY OF PRESENT ILLNESS:   Anne [name], a 76 y.o. female, was seen for a [place] cancer genetics consultation at the request of Dr. Marko Plume due to a personal and family history of cancer.  Anne [name] presents to clinic today to discuss the possibility of a hereditary predisposition to cancer, genetic testing, and to further clarify her future cancer risks, as well as potential cancer risks for family members.   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 This was treated with chemotherapy and radiation.  She was also treated with tamoxifen. She was also diagnosed with lobular breast cancer again in 2008 and then breast cancer that metastasized to the skin in 2010.   CANCER HISTORY:   No history exists.     HORMONAL RISK FACTORS:  Menarche was at age 8.  First live birth at age 47.  OCP use for approximately 0 years.  Ovaries intact: yes.  Hysterectomy: no.  Menopausal status: postmenopausal.  HRT use: 1 years. Colonoscopy: yes; normal. Mammogram within the last year: yes. Number of breast biopsies: 4. Up to date with pelvic exams:  n/a. Any excessive radiation exposure in the past:  Treated for her breast cancer  Past Medical History:  Diagnosis Date  . ALKALINE PHOSPHATASE, ELEVATED 03/15/2009  . Allergic state 06/10/2012  . Anemia   . Anxiety and depression 04/28/2011  . Arthritis   . Atypical chest pain 11/30/2011  . 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 Baker's  cyst of knee 05/22/2011  . Cancer ([place]) 01,  08   XRT/chemo 01-02/ lobular invasive ca  . Carotid artery disease ([place])    a. Carotid duplex 03/2014: stable 1-39% BICA, f/u due 03/2016.  [name] Chronic alcoholism in remission ([place]) 03/29/2011   Did not tolerate Klonopin, caused some confusion and bad dreams.    [name] COPD (chronic obstructive pulmonary disease) ([place])   . Depression   . Dermatitis 11/23/2012  . Dysphagia   . EE (eosinophilic esophagitis)   . Elevated sed rate 08/02/2013  . Esophageal ring   . ESOPHAGEAL STRICTURE 03/29/2009  . Fall 11/23/2012  . Family history of breast cancer   . Family history of colon cancer   . Family history of ovarian cancer   . Family history of pancreatic cancer   . Folliculitis of nose 7/51/0258  . GERD (gastroesophageal reflux disease) 09/29/2009   improved s/p cholecystectomy and esophagus dilatation  . Hematuria   . History of chicken pox   . History of measles   . History of shingles    2 episodes  . Hx of echocardiogram    a. Echo 01/2013: mild LVH, EF 55-60%, normal wall motion, Gr 1 diast dysfn  . Hyperlipidemia   . Knee pain, bilateral 07/23/2011  . Medicare annual wellness visit, subsequent 06/19/2015  . Mixed hyperlipidemia 10/17/2010   Qualifier: Diagnosis of  By: Mack Guise    .  Orthostasis   . Osteopenia 03/14/2011  . 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 Pneumonia 2-10   pleurisy  . Preventative health care 10/21/2012  . PVC's (premature ventricular contractions)    a. Event monitor 01/2013: NSR, extensive PVCs.  . Radial neck fracture 10/2011   minimally displaced  . Sinusitis, acute 12/04/2015  . Trauma 10/18/2011  . URI (upper respiratory infection) 09/02/2012  . Urinary frequency 12/12/2014  . Urinary incontinence 03/19/2012  . Vaginitis 05/22/2011    Past Surgical History:  Procedure Laterality Date  . ANTERIOR CRUCIATE LIGAMENT REPAIR  08, 09, 10  . BREAST LUMPECTOMY  2001  . BREAST RECONSTRUCTION  2008, 2009, 2010  .  CHOLECYSTECTOMY  2010  . COLONOSCOPY  09/05/10   cecal avm's  . ERCP  2010    CBD stone extraction   . ESOPHAGOGASTRODUODENOSCOPY  01/08/2012   Procedure: ESOPHAGOGASTRODUODENOSCOPY (EGD);  Surgeon: Gatha Mayer, MD;  Location: [facility];  Service: Endoscopy;  Laterality: N/A;  . ESOPHAGOGASTRODUODENOSCOPY (EGD) WITH ESOPHAGEAL DILATION  2010, 2012  . LAPAROSCOPIC APPENDECTOMY N/A 08/04/2016   Procedure: APPENDECTOMY LAPAROSCOPIC;  Surgeon: Michael Boston, MD;  Location: [facility];  Service: General;  Laterality: N/A;  . MASTECTOMY MODIFIED RADICAL     , Mastectomy modified radical (08), breast reconstruction, CA lesions excised lateral abd wall 2010  . SAVORY DILATION  01/08/2012   Procedure: SAVORY DILATION;  Surgeon: Gatha Mayer, MD;  Location: [facility];  Service: Endoscopy;  Laterality: N/A;  need xray    Social History   Social History  . Marital status: Married    Spouse name: N/A  . Number of children: N/A  . Years of education: N/A   Social History Main Topics  . Smoking status: Former Smoker    Years: 50.00    Types: Cigarettes    Quit date: 05/22/2007  . Smokeless tobacco: Never Used  . Alcohol use 0.6 oz/week    1 Glasses of wine per week     Comment: at least 6 oz daily  . Drug use: No  . Sexual activity: Yes    Partners: Male     Comment: lives with husband,    Other Topics Concern  . None   Social History Narrative  . None     FAMILY HISTORY:  We obtained a detailed, 4-generation family history.  Significant diagnoses are listed below: Family History  Problem Relation Age of Onset  . Heart disease Father   . Lung cancer Father     smoker  . Cirrhosis Sister     Primary Biliary  . Stroke Maternal Grandmother   . Alcohol abuse Maternal Grandfather   . Heart disease Paternal Grandfather   . Anxiety disorder Sister   . Osteoporosis Sister   . Arthritis Sister     Rheumatoid  . Osteoporosis Sister   . Skin cancer Sister     multiple skin  cancers, over 60 excisions.  . Other Mother     tic douloureux  . Breast cancer Maternal Aunt     dx in her 27s  . Breast cancer Paternal Aunt     dx in her 19s  . Pancreatic cancer Maternal Uncle     dx in his 15s; smoker  . Breast cancer Maternal Aunt     dx in her 40s  . Breast cancer Maternal Aunt     possible breast cancer dx and died in her 26s  . Ovarian cancer Maternal Aunt 29  . Pancreatic  cancer Maternal Aunt     dx in her 60s  . Stomach cancer Maternal Uncle   . Colon cancer Maternal Uncle   . Lung cancer Paternal Aunt   . Breast cancer Cousin     paternal first cousin  . Breast cancer Cousin     maternal first cousin  . Anesthesia problems Neg Hx   . Hypotension Neg Hx   . Malignant hyperthermia Neg Hx   . Pseudochol deficiency Neg Hx     The patient has three children who are all cancer free.  She has three sisters, one who has had multiple melanomas.  Her father had lung cancer and was a smoker.  He had three bothers and two sisters, one sister had breast cancer, and one sister had a daughter with breast cancer.  The patient's mother died at 35.  She had 12 siblings.  Two sisters had breast cancer and a third possibly had breast cancer.  One sister had ovarian cancer and died at 63 and another sister had pancreatic cancer in her 29's.  One brother had colon cancer, one brother had stomach cancer and a third brother had pancreatic cancer.  There is one maternal cousin with breast cancer and a cousin who had a son with splenic cancer at age 26.  There is no other reported family history of cancer.  Patient's maternal ancestors are of Caucasian descent, and paternal ancestors are of [place] descent. There is no reported Ashkenazi Jewish ancestry. There is no known consanguinity.  GENETIC COUNSELING ASSESSMENT: Anne [name] is a 75 y.o. female with a personal history of breast cancer and family history of breast, ovarian, pancreatic and stomach cancer and melanoma which is  somewhat suggestive of a hereditary cancer syndrome and predisposition to cancer. We, therefore, discussed and recommended the following at today's visit.   DISCUSSION: We discussed that about 5-10% of breast cancer is hereditary with most cases due to BRCA mutations.  We discussed that there are other genes that could be associated with the cancers in her family including several melanoma genes.  We reviewed the characteristics, features and inheritance patterns of hereditary cancer syndromes. We also discussed genetic testing, including the appropriate family members to test, the process of testing, insurance coverage and turn-around-time for results. We discussed the implications of a negative, positive and/or variant of uncertain significant result. We recommended Anne [name] pursue genetic testing for the Comprehensive cancer gene panel and BAP1 and MITF to cover melanoma.  Based on Anne [name]'s personal and family history of cancer, she meets medical criteria for genetic testing. Despite that she meets criteria, she may still have an out of pocket cost. We discussed that if her out of pocket cost for testing is over $100, the laboratory will call and confirm whether she wants to proceed with testing.  If the out of pocket cost of testing is less than $100 she will be billed by the genetic testing laboratory.   PLAN: After considering the risks, benefits, and limitations, Anne [name]  provided informed consent to pursue genetic testing and the blood sample was sent to [facility] for analysis of the Comprehensive cancer panel and two additional melanoma genes. Results should be available within approximately 2-3 weeks' time, at which point they will be disclosed by telephone to Anne [name], as will any additional recommendations warranted by these results. Anne [name] will receive a summary of her genetic counseling visit and a copy of her results once available. This information will  also be available in Epic.  We encouraged Anne [name] to remain in contact with cancer genetics annually so that we can continuously update the family history and inform her of any changes in cancer genetics and testing that may be of benefit for her family. Anne [name] questions were answered to her satisfaction today. Our contact information was provided should additional questions or concerns arise.  Lastly, we encouraged Anne [name] to remain in contact with cancer genetics annually so that we can continuously update the family history and inform her of any changes in cancer genetics and testing that may be of benefit for this family.   Ms.  [name] questions were answered to her satisfaction today. Our contact information was provided should additional questions or concerns arise. Thank you for the referral and allowing [place] to share in the care of your patient.   Karen P. Florene Glen, [place], [facility] Certified Genetic Counselor [email or web address] phone: [phone]  The patient was seen for a total of 45 minutes in face-to-face genetic counseling.  This patient was discussed with Drs. Magrinat, Lindi Adie and/or Burr Medico who agrees with the above.    _______________________________________________________________________ For Office Staff:  Number of people involved in session: 2 Was an Intern/ student involved with case: yes Rise Paganini

## 2016-08-15 ENCOUNTER — Other Ambulatory Visit: Payer: Self-pay | Admitting: Family Medicine

## 2016-08-15 DIAGNOSIS — S299XXA Unspecified injury of thorax, initial encounter: Secondary | ICD-10-CM

## 2016-08-16 ENCOUNTER — Other Ambulatory Visit: Payer: Self-pay | Admitting: Family Medicine

## 2016-08-16 ENCOUNTER — Telehealth: Payer: Self-pay | Admitting: Family Medicine

## 2016-08-16 DIAGNOSIS — M25561 Pain in right knee: Secondary | ICD-10-CM

## 2016-08-16 NOTE — Telephone Encounter (Signed)
[place]

## 2016-08-16 NOTE — Telephone Encounter (Signed)
Faxed hardcopy for lorazepam to

## 2016-08-16 NOTE — Telephone Encounter (Signed)
Caller name: Relationship to patient: Self Can be reached: [phone] or [phone] Pharmacy:  Reason for call: Patient request a referral to Dr. Delilah Shan @ [facility] for her right knee pain. States she was seen there by Dr. Alvan Dame there but he is not available and she has an appointment tomorrow with Dr. Delilah Shan. Plse adv

## 2016-08-16 NOTE — Telephone Encounter (Signed)
done

## 2016-08-17 DIAGNOSIS — M25561 Pain in right knee: Secondary | ICD-10-CM | POA: Diagnosis not present

## 2016-08-21 DIAGNOSIS — Z803 Family history of malignant neoplasm of breast: Secondary | ICD-10-CM | POA: Diagnosis not present

## 2016-08-21 DIAGNOSIS — Z853 Personal history of malignant neoplasm of breast: Secondary | ICD-10-CM | POA: Diagnosis not present

## 2016-08-21 DIAGNOSIS — Z8041 Family history of malignant neoplasm of ovary: Secondary | ICD-10-CM | POA: Diagnosis not present

## 2016-08-21 DIAGNOSIS — Z800 Family history of malignant neoplasm of digestive organs: Secondary | ICD-10-CM | POA: Diagnosis not present

## 2016-08-28 ENCOUNTER — Ambulatory Visit: Payer: Commercial Managed Care - HMO | Admitting: Physician Assistant

## 2016-08-28 DIAGNOSIS — M25561 Pain in right knee: Secondary | ICD-10-CM | POA: Diagnosis not present

## 2016-08-29 ENCOUNTER — Telehealth: Payer: Self-pay | Admitting: *Deleted

## 2016-08-29 NOTE — Telephone Encounter (Signed)
Pt. Called wanting the speak with Dr.Livesay's nurse. Pt stated that she has been calling and leaving messages but has yet to receive a call back. Call was transferred to vmail

## 2016-08-30 ENCOUNTER — Telehealth: Payer: Self-pay

## 2016-08-30 ENCOUNTER — Encounter: Payer: Self-pay | Admitting: Oncology

## 2016-08-30 ENCOUNTER — Encounter: Payer: Self-pay | Admitting: Family Medicine

## 2016-08-30 NOTE — Telephone Encounter (Signed)
lvm that an appt request has been sent to schedulers and to expect a call. Her genetics tests are still in process so no results yet.

## 2016-08-30 NOTE — Progress Notes (Signed)
Medical Oncology  Message to RN that patient would like to be seen re discussion with Dr Marla Roe (plastics) and is upset that she has received bill for genetics testing but has not gotten results (testing sent 08-13-16). Scheduling message sent  Message to genetics counselors sent Message to HIM to get note from Dr Marla Roe  L.Marko Plume, MD

## 2016-09-04 ENCOUNTER — Telehealth: Payer: Self-pay | Admitting: Genetic Counselor

## 2016-09-04 NOTE — Telephone Encounter (Signed)
Revealed negative genetic testing on a custom panel that looked at genes associated with hereditary breast, ovarian, pancreatic cancer and melanoma.

## 2016-09-06 DIAGNOSIS — M25561 Pain in right knee: Secondary | ICD-10-CM | POA: Diagnosis not present

## 2016-09-06 DIAGNOSIS — M23300 Other meniscus derangements, unspecified lateral meniscus, right knee: Secondary | ICD-10-CM | POA: Diagnosis not present

## 2016-09-06 DIAGNOSIS — M238X1 Other internal derangements of right knee: Secondary | ICD-10-CM | POA: Diagnosis not present

## 2016-09-13 ENCOUNTER — Ambulatory Visit: Payer: Self-pay | Admitting: Genetic Counselor

## 2016-09-13 DIAGNOSIS — C792 Secondary malignant neoplasm of skin: Principal | ICD-10-CM

## 2016-09-13 DIAGNOSIS — Z8041 Family history of malignant neoplasm of ovary: Secondary | ICD-10-CM

## 2016-09-13 DIAGNOSIS — C50911 Malignant neoplasm of unspecified site of right female breast: Secondary | ICD-10-CM

## 2016-09-13 DIAGNOSIS — Z800 Family history of malignant neoplasm of digestive organs: Secondary | ICD-10-CM

## 2016-09-13 DIAGNOSIS — Z803 Family history of malignant neoplasm of breast: Secondary | ICD-10-CM

## 2016-09-13 DIAGNOSIS — Z1379 Encounter for other screening for genetic and chromosomal anomalies: Secondary | ICD-10-CM | POA: Insufficient documentation

## 2016-09-13 NOTE — Progress Notes (Signed)
HPI: Ms. Chimenti was previously seen in the [facility] clinic due to a personal and family history of cancer and concerns regarding a hereditary predisposition to cancer. Please refer to our prior cancer genetics clinic note for more information regarding Ms. Friedmann's medical, social and family histories, and our assessment and recommendations, at the time. Ms. Mccluskey recent genetic test results were disclosed to her, as were recommendations warranted by these results. These results and recommendations are discussed in more detail below.  FAMILY HISTORY:  We obtained a detailed, 4-generation family history.  Significant diagnoses are listed below: Family History  Problem Relation Age of Onset  . Heart disease Father   . Lung cancer Father     smoker  . Cirrhosis Sister     Primary Biliary  . Stroke Maternal Grandmother   . Alcohol abuse Maternal Grandfather   . Heart disease Paternal Grandfather   . Anxiety disorder Sister   . Osteoporosis Sister   . Arthritis Sister     Rheumatoid  . Osteoporosis Sister   . Skin cancer Sister     multiple skin cancers, over 21 excisions.  . Other Mother     tic douloureux  . Breast cancer Maternal Aunt     dx in her 60s  . Breast cancer Paternal Aunt     dx in her 99s  . Pancreatic cancer Maternal Uncle     dx in his 30s; smoker  . Breast cancer Maternal Aunt     dx in her 23s  . Breast cancer Maternal Aunt     possible breast cancer dx and died in her 7s  . Ovarian cancer Maternal Aunt 29  . Pancreatic cancer Maternal Aunt     dx in her 70s  . Stomach cancer Maternal Uncle   . Colon cancer Maternal Uncle   . Lung cancer Paternal Aunt   . Breast cancer Cousin     paternal first cousin  . Breast cancer Cousin     maternal first cousin  . Anesthesia problems Neg Hx   . Hypotension Neg Hx   . Malignant hyperthermia Neg Hx   . Pseudochol deficiency Neg Hx     The patient has three children who are all cancer free.  She has  three sisters, one who has had multiple melanomas.  Her father had lung cancer and was a smoker.  He had three bothers and two sisters, one sister had breast cancer, and one sister had a daughter with breast cancer.  The patient's mother died at 20.  She had 12 siblings.  Two sisters had breast cancer and a third possibly had breast cancer.  One sister had ovarian cancer and died at 9 and another sister had pancreatic cancer in her 3's.  One brother had colon cancer, one brother had stomach cancer and a third brother had pancreatic cancer.  There is one maternal cousin with breast cancer and a cousin who had a son with splenic cancer at age 53.  There is no other reported family history of cancer.  Patient's maternal ancestors are of Caucasian descent, and paternal ancestors are of [place] descent. There is no reported Ashkenazi Jewish ancestry. There is no known consanguinity.  GENETIC TEST RESULTS: At the time of Ms. Raffield's visit, we recommended she pursue genetic testing of a Custom gene panel that looked at genes associated with breast, ovarian, and colon cancer as well as melanoma. The Custom Cancer Panel offered by [facility] includes sequencing and/or  deletion duplication testing of the following 34 genes: APC, ATM, AXIN2, BAP1, BARD1, BMPR1A, BRCA1, BRCA2, BRIP1, CDH1, CDK4, CDKN2A, CHEK2, EPCAM, FANCC, MLH1, MSH2, MSH6, MUTYH, NBN, PALB2, PMS2, POLD1, POLE, PTEN, RAD51C, RAD51D, SCG5/GREM1, SMAD4, STK11, TP53, VHL, and XRCC2  For MITF, only c.952G>A ([id number]) was analyzed and reported.  The report date is August 31, 2016.  Genetic testing was normal, and did not reveal a deleterious mutation in these genes. The test report has been scanned into EPIC and is located under the Molecular Pathology section of the Results Review tab.   We discussed with Ms. Altidor that since the current genetic testing is not perfect, it is possible there may be a gene mutation in one of these genes that current testing  cannot detect, but that chance is small. We also discussed, that it is possible that another gene that has not yet been discovered, or that we have not yet tested, is responsible for the cancer diagnoses in the family, and it is, therefore, important to remain in touch with cancer genetics in the future so that we can continue to offer Ms. Holzheimer the most up to date genetic testing.   CANCER SCREENING RECOMMENDATIONS: Given Ms. Enochs's personal and family histories, we must interpret these negative results with some caution.  Families with features suggestive of hereditary risk for cancer tend to have multiple family members with cancer, diagnoses in multiple generations and diagnoses before the age of 45. Ms. Budreau family exhibits some of these features. Thus this result may simply reflect our current inability to detect all mutations within these genes or there may be a different gene that has not yet been discovered or tested.   RECOMMENDATIONS FOR FAMILY MEMBERS: Women in this family might be at some increased risk of developing cancer, over the general population risk, simply due to the family history of cancer. We recommended women in this family have a yearly mammogram beginning at age 74, or 61 years younger than the earliest onset of cancer, an annual clinical breast exam, and perform monthly breast self-exams. Women in this family should also have a gynecological exam as recommended by their primary provider. All family members should have a colonoscopy by age 22.  FOLLOW-UP: Lastly, we discussed with Ms. Heming that cancer genetics is a rapidly advancing field and it is possible that new genetic tests will be appropriate for her and/or her family members in the future. We encouraged her to remain in contact with cancer genetics on an annual basis so we can update her personal and family histories and let her know of advances in cancer genetics that may benefit this family.   Our contact number was  provided. Ms. Yim questions were answered to her satisfaction, and she knows she is welcome to call us at anytime with additional questions or concerns.   Roma Kayser, MS, [facility] Certified Genetic Counselor [email or web address]

## 2016-09-16 ENCOUNTER — Other Ambulatory Visit: Payer: Self-pay | Admitting: Oncology

## 2016-09-16 DIAGNOSIS — C50911 Malignant neoplasm of unspecified site of right female breast: Secondary | ICD-10-CM

## 2016-09-16 DIAGNOSIS — Z170 Estrogen receptor positive status [ER+]: Principal | ICD-10-CM

## 2016-09-20 ENCOUNTER — Ambulatory Visit (HOSPITAL_BASED_OUTPATIENT_CLINIC_OR_DEPARTMENT_OTHER): Payer: Commercial Managed Care - HMO | Admitting: Oncology

## 2016-09-20 ENCOUNTER — Other Ambulatory Visit (HOSPITAL_BASED_OUTPATIENT_CLINIC_OR_DEPARTMENT_OTHER): Payer: Commercial Managed Care - HMO

## 2016-09-20 ENCOUNTER — Encounter: Payer: Self-pay | Admitting: Oncology

## 2016-09-20 VITALS — BP 96/59 | HR 109 | Temp 98.6°F | Resp 18 | Ht 66.0 in | Wt 134.3 lb

## 2016-09-20 DIAGNOSIS — M8580 Other specified disorders of bone density and structure, unspecified site: Secondary | ICD-10-CM | POA: Diagnosis not present

## 2016-09-20 DIAGNOSIS — Z9889 Other specified postprocedural states: Secondary | ICD-10-CM

## 2016-09-20 DIAGNOSIS — Z853 Personal history of malignant neoplasm of breast: Secondary | ICD-10-CM

## 2016-09-20 DIAGNOSIS — Z170 Estrogen receptor positive status [ER+]: Principal | ICD-10-CM

## 2016-09-20 DIAGNOSIS — C50911 Malignant neoplasm of unspecified site of right female breast: Secondary | ICD-10-CM

## 2016-09-20 DIAGNOSIS — C792 Secondary malignant neoplasm of skin: Secondary | ICD-10-CM

## 2016-09-20 LAB — CBC WITH DIFFERENTIAL/PLATELET
BASO%: 1 % (ref 0.0–2.0)
BASOS ABS: 0.1 10*3/uL (ref 0.0–0.1)
EOS ABS: 0.1 10*3/uL (ref 0.0–0.5)
EOS%: 1.1 % (ref 0.0–7.0)
HEMATOCRIT: 38.8 % (ref 34.8–46.6)
HGB: 12.5 g/dL (ref 11.6–15.9)
LYMPH#: 1.6 10*3/uL (ref 0.9–3.3)
LYMPH%: 24.3 % (ref 14.0–49.7)
MCH: 30.9 pg (ref 25.1–34.0)
MCHC: 32.3 g/dL (ref 31.5–36.0)
MCV: 95.6 fL (ref 79.5–101.0)
MONO#: 0.4 10*3/uL (ref 0.1–0.9)
MONO%: 6.7 % (ref 0.0–14.0)
NEUT#: 4.5 10*3/uL (ref 1.5–6.5)
NEUT%: 66.9 % (ref 38.4–76.8)
PLATELETS: 247 10*3/uL (ref 145–400)
RBC: 4.06 10*6/uL (ref 3.70–5.45)
RDW: 15.3 % — ABNORMAL HIGH (ref 11.2–14.5)
WBC: 6.7 10*3/uL (ref 3.9–10.3)

## 2016-09-20 LAB — COMPREHENSIVE METABOLIC PANEL
ALBUMIN: 3.7 g/dL (ref 3.5–5.0)
ALK PHOS: 121 U/L (ref 40–150)
ALT: 18 U/L (ref 0–55)
ANION GAP: 10 meq/L (ref 3–11)
AST: 18 U/L (ref 5–34)
BUN: 17 mg/dL (ref 7.0–26.0)
CALCIUM: 9.8 mg/dL (ref 8.4–10.4)
CHLORIDE: 105 meq/L (ref 98–109)
CO2: 26 mEq/L (ref 22–29)
Creatinine: 0.8 mg/dL (ref 0.6–1.1)
EGFR: 77 mL/min/{1.73_m2} — AB (ref >90)
Glucose: 99 mg/dl (ref 70–140)
POTASSIUM: 3.6 meq/L (ref 3.5–5.1)
Sodium: 141 mEq/L (ref 136–145)
Total Bilirubin: 0.3 mg/dL (ref 0.20–1.20)
Total Protein: 7.2 g/dL (ref 6.4–8.3)

## 2016-09-20 NOTE — Progress Notes (Signed)
OFFICE PROGRESS NOTE   September 20, 2016   Physicians: S.Charlett Blake (PCP), Tennis Must (urology), C.Gessner, G.Chapman Fitch ([place]) [place], Dorris Carnes, Neysa Bonito  INTERVAL HISTORY:  Patient is seen, together with husband, at her request, to discuss recent consultation with Dr Marla Roe re breast reconstruction. Patient is on observation since she discontinued Arimidex around 02-2014 (or prior), that used after skin recurrence of second primary right breast cancer.  She had left mammogram 03-28-16 and [place] of reconstructed right breast 04-12-16.   Patient saw Dr Marla Roe in consultation on 06-19-16, that note in this EMR under [facility]where [facility]. Per that documentation, options for better cosmetic result on right complicated by risk of complications there, with safest option to achieve better symmetry thought to be mastopexy reduction on left.  Dr Eusebio Friendly note states that patient has saline implant on right. (Her note incorrectly states HER 2 positive).  Patient is not aware of any changes in left breast or in the reconstructed right breast. She is unhappy with asymmetry of breasts.  She had laparoscopic appendectomy by Dr Johney Maine 08-04-16 for acute appendicitis, pathology negative for malignancy ([id number]) and CT AP done for that acute problem showing no concerns in liver, uterus, ovaries and no other evidence of metastatic disease. She has recovered well from that surgery. Otherwise she has had no recent illness, no SOB or cough, no new or different pain, appetite at baseline, energy at baseline, no bleeding, no LE swelling. Remainder of 10 point Review of Systems negative  Flu vaccine 08-01-16 No central catheter Genetics testing normal 08-2016 by Custom Panel from [facility], including normal BRCA.  ONCOLOGIC HISTORY History is of 2 primary lobular right breast carcinomas. 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 I believe that she had local radiation, tho that information is not available in this EMR. Second diagnosis was Jan. 2008 (tho patient did not agree to surgery until July 2008), invasive lobular carcinoma  ER 81%, PR 96%, Her 2 1+  ([id number]) , 1.5 cm invasive lobular with 3 axillary nodes negative, LVSI and perineural invasion present ([id number]).  She subsequently declined adjuvant systemic treatment . She had a complicated course following right mastectomy, with difficult healing after right breast reconstruction. She had skin recurrence lateral right chest wall excised in Sept 2010 then treated with xeloda from Oct 2010 thru Feb 2011 and began on Arimidex July 2011; she may have stopped Arimidex after bone density scan 02-2014, or possibly the year prior (?). She has had no documented active disease since this most recent treatment, including PET 05-14-12 with no evidence of metastatic disease. Bone density scan [facility] 02-15-14 still osteopenic range, slightly lower in LS compared with 2013 and stable in hips.  Objective:  Vital signs in last 24 hours:  BP (!) 96/59 (BP Location: Right Arm, Patient Position: Sitting)   Pulse (!) 109   Temp 98.6 F (37 C) (Oral)   Resp 18   Ht 5' 6"  (1.676 m)   Wt 134 lb 4.8 oz (60.9 kg)   SpO2 94%   BMI 21.68 kg/m  Weight up 2 lbs Alert, oriented and appropriate. Ambulatory without difficulty, easily able to get on and off exam table.  HEENT:PERRL, sclerae not icteric. Oral mucosa moist without lesions, posterior pharynx clear.  Neck supple. No JVD.  Lymphatics:no cervical,supraclavicular, axillary adenopathy Resp: clear to auscultation bilaterally and no dullness to percussion bilaterally Cardio: regular rate and rhythm. No gallop. GI: soft, nontender, not distended, no  mass or organomegaly. Normally active bowel sounds. Surgical incision not remarkable. Musculoskeletal/ Extremities: UE / LE without pitting edema, cords, tenderness Neuro:  nonfocal   Skin without rash, ecchymosis, petechiae Breasts: Right reconstructed breast with no findings of concern laterally in area of previous skin recurrence, skin intact across the reconstruction, which is not tight or fixed, no areas of thickening or other obvious concern. Left breast with well healed scars from previous mammoplasty without dominant mass, skin or nipple findings. Axillae benign.   Lab Results:  Results for orders placed or performed in visit on 09/20/16  CBC with Differential  Result Value Ref Range   WBC 6.7 3.9 - 10.3 10e3/uL   NEUT# 4.5 1.5 - 6.5 10e3/uL   HGB 12.5 11.6 - 15.9 g/dL   HCT 38.8 34.8 - 46.6 %   Platelets 247 145 - 400 10e3/uL   MCV 95.6 79.5 - 101.0 fL   MCH 30.9 25.1 - 34.0 pg   MCHC 32.3 31.5 - 36.0 g/dL   RBC 4.06 3.70 - 5.45 10e6/uL   RDW 15.3 (H) 11.2 - 14.5 %   lymph# 1.6 0.9 - 3.3 10e3/uL   MONO# 0.4 0.1 - 0.9 10e3/uL   Eosinophils Absolute 0.1 0.0 - 0.5 10e3/uL   Basophils Absolute 0.1 0.0 - 0.1 10e3/uL   NEUT% 66.9 38.4 - 76.8 %   LYMPH% 24.3 14.0 - 49.7 %   MONO% 6.7 0.0 - 14.0 %   EOS% 1.1 0.0 - 7.0 %   BASO% 1.0 0.0 - 2.0 %  Comprehensive metabolic panel  Result Value Ref Range   Sodium 141 136 - 145 mEq/L   Potassium 3.6 3.5 - 5.1 mEq/L   Chloride 105 98 - 109 mEq/L   CO2 26 22 - 29 mEq/L   Glucose 99 70 - 140 mg/dl   BUN 17.0 7.0 - 26.0 mg/dL   Creatinine 0.8 0.6 - 1.1 mg/dL   Total Bilirubin 0.30 0.20 - 1.20 mg/dL   Alkaline Phosphatase 121 40 - 150 U/L   AST 18 5 - 34 U/L   ALT 18 0 - 55 U/L   Total Protein 7.2 6.4 - 8.3 g/dL   Albumin 3.7 3.5 - 5.0 g/dL   Calcium 9.8 8.4 - 10.4 mg/dL   Anion Gap 10 3 - 11 mEq/L   EGFR 77 (L) >90 ml/min/1.73 m2    Labs available and reviewed with patient at time of visit.     Studies/Results:  04-12-2016 CLINICAL DATA:  75 year old with prior malignant right mastectomy in July, 2008 with implant reconstruction. Patient presents with approximate 2 week history of sharp, stabbing pain in the right breast. ULTRASOUND OF THE RIGHT BREAST  COMPARISON:  None.  FINDINGS: On physical exam, there is no palpable abnormality in the right chest wall. The implant appears intact and is soft to palpation.  Targeted ultrasound is performed, showing normal appearing subcutaneous tissues overlying the right breast implant. The implant is intact. The scar from the prior incision is noted far laterally. No pathologic lymphadenopathy is identified in the right axilla.  IMPRESSION: No evidence of recurrent malignancy in the right chest wall in this patient who has undergone prior right mastectomy and implant reconstruction.  RECOMMENDATION: Followup imaging of the right mastectomy site as determined by clinical examination. Annual screening mammography  of the contralateral left breast which is due in 1 year.  I have discussed the findings and recommendations with the patient. Results were also provided in writing at the conclusion of the visit. If applicable, a reminder letter will be sent to the patient regarding the next appointment.  BI-RADS CATEGORY  2: Benign.       CT ABDOMEN AND PELVIS WITH CONTRAST  08-03-16  TECHNIQUE: Multidetector CT imaging of the abdomen and pelvis was performed using the standard protocol following bolus administration of intravenous contrast.  CONTRAST:  144m ISOVUE-300 IOPAMIDOL (ISOVUE-300) INJECTION 61%  COMPARISON:  07/03/2013  FINDINGS: Lower chest: Mild linear scarring in the lingula.  Status post right mastectomy.  Hepatobiliary: Liver is within normal limits.  Status post cholecystectomy. No intrahepatic ductal dilatation. Dilated common duct, measuring 10 mm, and smoothly tapering at  the ampulla.  Pancreas: Within normal limits.  Spleen: Within normal limits.  Adrenals/Urinary Tract: Adrenal glands within normal limits.  Kidneys are within normal limits.  No hydronephrosis.  Bladder is within normal limits.  Stomach/Bowel: Stomach is within normal limits.  No evidence of bowel obstruction.  Dilated distal appendix, measuring up to 10 mm, with associated periappendiceal inflammatory changes (series 2/ image 51), reflecting acute appendicitis.  No pericolonic inflammatory changes to suggest diverticulitis.  Vascular/Lymphatic: No evidence of abdominal aortic aneurysm.  Atherosclerotic calcifications of the abdominal aorta and branch vessels.  No suspicious abdominopelvic lymphadenopathy.  Reproductive: Uterus is within normal limits.  Bilateral ovaries are within normal limits.  Other: No abdominopelvic ascites.  No drainable fluid collection/ abscess.  No free air.  Musculoskeletal: Mild degenerative changes at L4-5.  IMPRESSION: Acute appendicitis. No drainable fluid collection/abscess. No free air.  PACs images reviewed by MD   Medications: I have reviewed the patient's current medications.  DISCUSSION Patient has talked at length about difficulties with reconstruction 2008, her tobacco abuse prior to surgery, subsequent skin recurrence and that treatment. She is extremely self conscious about breast asymmetry. She does not recall consultation with Dr DMarla Roeexactly as in information that I have available.  [facility] I have spoken with Dr GWyline Copasoffice, will send records for his review, possible consultation at his discretion after review of records. Fax for Dr GStasia [phone] phone thru main # [phone]    From medical oncology standpoint, we are all pleased that she has had no further evidence of the breast cancer since treatment for the skin  recurrence following the reconstruction.  She understands that she does not need routine mammogram on the reconstructed right breast.   I have told patient that I will not be working after first of year, tho certainly I am glad to assist if needed prior to that time. She would like to establish with one of my partners in medical oncology for next visit in ~ 6 months, perfers female physician. She may benefit from Breast Survivorship Clinic also. She should have left mammogram 03-2017.   Husband very supportive during all of visit. Patient and husband express appreciation for care here and by Dr BCharlett Blake  Assessment/Plan:  1.two primary lobular right breast carcinomas, in 2001 and 2008, with lateral right chest wall recurrence 07-2009: patient discontinued Arimidex after either 3 or 4 years. She will meet breast medical oncologist for next scheduled visit at this office. She may benefit from Survivorship Clinic.  Left breast imaging due 03-2017. 2.osteopenia 3.long past tobacco, at least 50  pack years. DCd 2008. 4.ETOH use heavy at times in past 5.hx anxiety and depression 6.Flu vaccine 08-01-16 7.chronic urologic problems known to urology 8.previous esophageal dilatations 9. Patient not satisfied with reconstruction due to asymmetry of breasts. Patient requests consultation with another plastic surgeon, referral initiated.   All questions answered. Time spent 25 min including >50% counseling and coordination of care. Route Dr Charlett Blake, cc with other records to Dr Stasia Cavalier.    Evlyn Clines, MD   09/20/2016, 5:23 PM

## 2016-09-24 ENCOUNTER — Telehealth: Payer: Self-pay

## 2016-09-24 NOTE — Telephone Encounter (Signed)
-----   Message from Gordy Levan, MD sent at 09/20/2016  4:57 PM EST ----- Regarding: referral to [facility] Please fax information indicated + my note 09-20-16 + last left mammogram + 2017 right breast US to  Dr Leeann Must  Plastic surgery [facility] Fax [phone]    (I routed to that office phone using main # [phone]) Include contact information for our office in the fax.  Please let patient / husband know that Dr Stasia Cavalier will review her information and let us know if he will see her in consultation. Let her know that I was able to get more of the pertinent operative notes to send to Dr Stasia Cavalier.   thanks

## 2016-09-24 NOTE — Telephone Encounter (Signed)
Faxed records noted below by Dr. Marko Plume to Dr. Stasia Cavalier.

## 2016-09-25 NOTE — Telephone Encounter (Signed)
Told Ms Rubach that Dr. Stasia Cavalier was sent information to review and will let us know if he will see her in consultation as noted below by Dr. Marko Plume.

## 2016-10-01 ENCOUNTER — Encounter: Payer: Self-pay | Admitting: Oncology

## 2016-10-01 NOTE — Progress Notes (Signed)
Medical Oncology  Fax received from Dr Mallie Darting office at [facility], will be scanned into this EMR:  "Thank you for sending the records on attached patient. Dr Stasia Cavalier has reviewed the records and feels that he does not have anything to offer toward further reconstruction."   Copy of this fax with note from this MD as follows mailed to patient:  "Anne [name]-  This is the reply we received from the plastic surgeon at [facility]. If you want to pursue, you could try calling directly to the [place] line [phone], as they may be more able to assist that way. Sorry our referral was not successful.  Anne [name]"  L.Marko Plume, MD

## 2016-11-10 ENCOUNTER — Other Ambulatory Visit: Payer: Self-pay | Admitting: Family Medicine

## 2016-11-10 DIAGNOSIS — S299XXA Unspecified injury of thorax, initial encounter: Secondary | ICD-10-CM

## 2016-11-13 NOTE — Telephone Encounter (Signed)
Requesting:  Lorazepam and Amitriptyline Contract  Signed on 12/07/2014 UDS  Moderate Last OV    05/10/2016 Last Refill   Lorazepam   #70 with 2 refills on 08/16/2016                     Amitriptyline   #90 with 1 refill on 05/17/2016  Please Advise

## 2016-11-13 NOTE — Telephone Encounter (Signed)
Faxed hardcopy for Lorazepam to [facility]ley 

## 2016-11-14 ENCOUNTER — Encounter: Payer: Self-pay | Admitting: Family Medicine

## 2016-11-27 ENCOUNTER — Other Ambulatory Visit: Payer: Self-pay | Admitting: Family Medicine

## 2016-12-11 ENCOUNTER — Telehealth: Payer: Self-pay | Admitting: Family Medicine

## 2016-12-11 NOTE — Telephone Encounter (Signed)
7.29.16 PR PPPS, SUBSEQ VISIT [id number]  LVM advising patient to schedule medicare wellness appointment 02/01/17 at 1:30pm patient has a physical appointment with PCP at 2:30pm.. Awaiting call back

## 2016-12-17 NOTE — Telephone Encounter (Signed)
AWV scheduled 1:30 pm 02/01/17

## 2016-12-19 NOTE — Progress Notes (Signed)
Cardiology Office Note   Date:  12/20/2016   ID:  Anne [name] 05/30/1941, MRN [id number]  PCP:  Anne Homans, MD  Cardiologist:   Anne Carnes, MD   Pt presents for f/u of dizziness      History of Present Illness: Anne [name] is a 76 y.o. female with a history of orthostatic intolerance, carotid stenosis, HL, COPD, anemia, GERD, eosinophilic esophagitis, breast CA. Echo 3/14: Mild LVH, EF 0000000, grade 1 diastolic dysfunction. Event monitor 01/2013: NSR, extensive PVCs. Carotid US [id number]: RICA [id number], LICA 123456 (f/u 1 year). Myoview 4/14: Low risk, no scar or ischemia, not gated. She has been treated with low dose metoprolol for PVCs and palps.  Emailed in last fall with c/o noticing skin discoloartion    SInce seen she dneis CP  Breathing is OK  NO SOB Had appendicitis last fall      Current Meds  Medication Sig  . acetaminophen (TYLENOL) 500 MG tablet Take 500-1,000 mg by mouth every 6 (six) hours as needed (for pain.).  [name] amitriptyline (ELAVIL) 150 MG tablet take 1 tablet by mouth at bedtime  . aspirin EC 81 MG tablet Take 81 mg by mouth daily.  [name] atorvastatin (LIPITOR) 40 MG tablet TAKE 1 TABLET EVERY DAY  . Cholecalciferol (VITAMIN D3) 1000 UNITS CAPS Take 2 tablets by mouth daily.    . Cyanocobalamin (VITAMIN B 12 PO) Take 1 tablet by mouth daily.  [name] diltiazem (CARDIZEM) 30 MG tablet take 1 tablet by mouth once daily for PALPITATIONS  . ferrous sulfate 325 (65 FE) MG tablet Take 325 mg by mouth daily with breakfast.  . ibuprofen (ADVIL,MOTRIN) 200 MG tablet Take 400-800 mg by mouth every 8 (eight) hours as needed (for pain.).  [name] LORazepam (ATIVAN) 1 MG tablet take 1 tablet by mouth every 8 hours if needed for anxiety  . pantoprazole (PROTONIX) 40 MG tablet Take 1 tablet (40 mg total) by mouth daily.  . Probiotic Product (PROBIOTIC FORMULA PO) Take 1 capsule by mouth daily.     Allergies:   Codeine; Morphine; Peanut-containing drug products; Sorbitol;  Tomato; Zofran; and Diphenhydramine hcl   Past Medical History:  Diagnosis Date  . ALKALINE PHOSPHATASE, ELEVATED 03/15/2009  . Allergic state 06/10/2012  . Anemia   . Anxiety and depression 04/28/2011  . Arthritis   . Atypical chest pain 11/30/2011  . 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 Baker's cyst of knee 05/22/2011  . Cancer ([place]) 01,  08   XRT/chemo 01-02/ lobular invasive ca  . Carotid artery disease ([place])    a. Carotid duplex 03/2014: stable 1-39% BICA, f/u due 03/2016.  [name] Chronic alcoholism in remission ([place]) 03/29/2011   Did not tolerate Klonopin, caused some confusion and bad dreams.    [name] COPD (chronic obstructive pulmonary disease) ([place])   . Depression   . Dermatitis 11/23/2012  . Dysphagia   . EE (eosinophilic esophagitis)   . Elevated sed rate 08/02/2013  . Esophageal ring   . ESOPHAGEAL STRICTURE 03/29/2009  . Fall 11/23/2012  . Family history of breast cancer   . Family history of colon cancer   . Family history of ovarian cancer   . Family history of pancreatic cancer   . Folliculitis of nose [id number]  . GERD (gastroesophageal reflux disease) 09/29/2009   improved s/p cholecystectomy and esophagus dilatation  . Hematuria   . History of chicken pox   . History of measles   .  History of shingles    2 episodes  . Hx of echocardiogram    a. Echo 01/2013: mild LVH, EF 55-60%, normal wall motion, Gr 1 diast dysfn  . Hyperlipidemia   . Knee pain, bilateral 07/23/2011  . Medicare annual wellness visit, subsequent 06/19/2015  . Mixed hyperlipidemia 10/17/2010   Qualifier: Diagnosis of  By: Mack Guise    . Orthostasis   . Osteopenia 03/14/2011  . 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 Pneumonia 2-10   pleurisy  . Preventative health care 10/21/2012  . PVC's (premature ventricular contractions)    a. Event monitor 01/2013: NSR, extensive PVCs.  . Radial neck fracture 10/2011   minimally displaced  . Sinusitis, acute 12/04/2015  . Trauma 10/18/2011   . URI (upper respiratory infection) 09/02/2012  . Urinary frequency 12/12/2014  . Urinary incontinence 03/19/2012  . Vaginitis 05/22/2011    Past Surgical History:  Procedure Laterality Date  . ANTERIOR CRUCIATE LIGAMENT REPAIR  08, 09, 10  . BREAST LUMPECTOMY  2001  . BREAST RECONSTRUCTION  2008, 2009, 2010  . CHOLECYSTECTOMY  2010  . COLONOSCOPY  09/05/10   cecal avm's  . ERCP  2010    CBD stone extraction   . ESOPHAGOGASTRODUODENOSCOPY  01/08/2012   Procedure: ESOPHAGOGASTRODUODENOSCOPY (EGD);  Surgeon: Gatha Mayer, MD;  Location: [facility];  Service: Endoscopy;  Laterality: N/A;  . ESOPHAGOGASTRODUODENOSCOPY (EGD) WITH ESOPHAGEAL DILATION  2010, 2012  . LAPAROSCOPIC APPENDECTOMY N/A 08/04/2016   Procedure: APPENDECTOMY LAPAROSCOPIC;  Surgeon: Michael Boston, MD;  Location: [facility];  Service: General;  Laterality: N/A;  . MASTECTOMY MODIFIED RADICAL     , Mastectomy modified radical (08), breast reconstruction, CA lesions excised lateral abd wall 2010  . SAVORY DILATION  01/08/2012   Procedure: SAVORY DILATION;  Surgeon: Gatha Mayer, MD;  Location: [facility];  Service: Endoscopy;  Laterality: N/A;  need xray     Social History:  The patient  reports that she quit smoking about 9 years ago. Her smoking use included Cigarettes. She quit after 50.00 years of use. She has never used smokeless tobacco. She reports that she drinks about 0.6 oz of alcohol per week . She reports that she does not use drugs.   Family History:  The patient's family history includes Alcohol abuse in her maternal grandfather; Anxiety disorder in her sister; Arthritis in her sister; Breast cancer in her cousin, cousin, maternal aunt, maternal aunt, maternal aunt, and paternal aunt; Cirrhosis in her sister; Colon cancer in her maternal uncle; Heart disease in her father and paternal grandfather; Lung cancer in her father and paternal aunt; Osteoporosis in her sister and sister; Other in her mother; Ovarian  cancer (age of onset: 32) in her maternal aunt; Pancreatic cancer in her maternal aunt and maternal uncle; Skin cancer in her sister; Stomach cancer in her maternal uncle; Stroke in her maternal grandmother.    ROS:  Please see the history of present illness. All other systems are reviewed and  Negative to the above problem except as noted.    PHYSICAL EXAM: VS:  BP 102/60   Pulse 85   Ht 5\' 6"  (1.676 m)   Wt 136 lb 1.9 oz (61.7 kg)   BMI 21.97 kg/m   GEN: Well nourished, well developed, in no acute distress  HEENT: normal  Neck: no JVD, carotid bruits, or masses Cardiac: RRR; no murmurs, rubs, or gallops,no edema  Respiratory:  clear to auscultation bilaterally, normal work of breathing GI: soft, nontender, nondistended, +  BS  No hepatomegaly  MS: no deformity Moving all extremities   Skin: warm and dry, no rash Neuro:  Strength and sensation are intact Psych: euthymic mood, full affect   EKG:  EKG is ordered today.  SR 85 bpm  RBBB     Lipid Panel    Component Value Date/Time   CHOL 175 03/02/2016 1025   TRIG 92.0 03/02/2016 1025   HDL 74.00 03/02/2016 1025   CHOLHDL 2 03/02/2016 1025   VLDL 18.4 03/02/2016 1025   LDLCALC 82 03/02/2016 1025   LDLDIRECT 117.4 01/13/2013 1210      Wt Readings from Last 3 Encounters:  12/20/16 136 lb 1.9 oz (61.7 kg)  09/20/16 134 lb 4.8 oz (60.9 kg)  08/03/16 132 lb (59.9 kg)      ASSESSMENT AND PLAN:  1  Hx orthostasis  Asymptomatic  Continue to push fluids  2  CV dz  Mild  Keep on statin  3  HL  Due to have chol checked soon  Keep on statin    4  CP  Denies    5  PVCs  Denies palpitations    Stay active  Call if develops symptoms  Stay hydrated  F/U in clinic next sping .   Current medicines are reviewed at length with the patient today.  The patient does not have concerns regarding medicines.  Signed, Anne Carnes, MD  12/20/2016 4:04 PM    [facility] [address], [place], [place]   [id number] Phone: [phone]; Fax: [phone]

## 2016-12-20 ENCOUNTER — Ambulatory Visit (INDEPENDENT_AMBULATORY_CARE_PROVIDER_SITE_OTHER): Payer: Medicare HMO | Admitting: Internal Medicine

## 2016-12-20 ENCOUNTER — Encounter: Payer: Self-pay | Admitting: Internal Medicine

## 2016-12-20 VITALS — BP 102/60 | HR 85 | Ht 66.0 in | Wt 136.1 lb

## 2016-12-20 DIAGNOSIS — E782 Mixed hyperlipidemia: Secondary | ICD-10-CM

## 2016-12-20 NOTE — Patient Instructions (Signed)
Your physician recommends that you continue on your current medications as directed. Please refer to the Current Medication list given to you today.  Your physician wants you to follow-up next spring, 2019 with Dr. Harrington Challenger.  You will receive a reminder letter in the mail two months in advance. If you don't receive a letter, please call our office to schedule the follow-up appointment.

## 2016-12-25 ENCOUNTER — Encounter (HOSPITAL_COMMUNITY): Payer: Self-pay

## 2017-01-20 ENCOUNTER — Encounter: Payer: Self-pay | Admitting: Family Medicine

## 2017-01-22 ENCOUNTER — Other Ambulatory Visit: Payer: Self-pay | Admitting: Family Medicine

## 2017-01-22 MED ORDER — AMITRIPTYLINE HCL 150 MG PO TABS: 150.0000 mg | ORAL_TABLET | Freq: Every day | ORAL | 0 refills | Status: DC

## 2017-01-23 ENCOUNTER — Telehealth: Payer: Self-pay

## 2017-01-23 ENCOUNTER — Encounter (HOSPITAL_COMMUNITY): Payer: Self-pay

## 2017-01-23 NOTE — Telephone Encounter (Signed)
Yes depression

## 2017-01-23 NOTE — Telephone Encounter (Signed)
Received PA request for amitriptyline, needing dx for usage of med. Chart reviewed, is Pt using amitriptyline for depression?

## 2017-01-24 NOTE — Telephone Encounter (Signed)
Received electronic PA approval, case number: [id number]. PA approved through 01/25/2019.

## 2017-01-24 NOTE — Telephone Encounter (Signed)
Received paper copy of PA approval. Sent to scan with proper identification on it.

## 2017-01-24 NOTE — Telephone Encounter (Signed)
PA initiated via Covermymeds; KEY: [id number]. Awaiting determination.

## 2017-01-30 ENCOUNTER — Telehealth: Payer: Self-pay | Admitting: Family Medicine

## 2017-01-30 NOTE — Telephone Encounter (Signed)
Relation to pt: self  Call back number:[phone]  Reason for call:  Patient 02/01/17 CPE had to be [facility] due to the provider,please advise if 03/11/17 hospital follow up slot can be utilized, please advise

## 2017-01-30 NOTE — Telephone Encounter (Signed)
AWV w/ health coach scheduled at 10:30 on 03/11/17 immediately prior to PCP appt.

## 2017-01-30 NOTE — Telephone Encounter (Signed)
yes

## 2017-02-01 ENCOUNTER — Encounter: Payer: Commercial Managed Care - HMO | Admitting: Family Medicine

## 2017-02-12 ENCOUNTER — Encounter: Payer: Self-pay | Admitting: Family Medicine

## 2017-02-12 DIAGNOSIS — S299XXA Unspecified injury of thorax, initial encounter: Secondary | ICD-10-CM

## 2017-02-14 NOTE — Telephone Encounter (Signed)
Requesting:   lorazepam Contract   12/07/14 UDS   Moderate   03/08/15 Last OV    05/10/2016---next appt 03/11/2017 Last Refill   #70 2 refills on 11/13/2016  Please Advise

## 2017-02-15 ENCOUNTER — Encounter: Payer: Self-pay | Admitting: Family Medicine

## 2017-02-15 ENCOUNTER — Other Ambulatory Visit: Payer: Self-pay | Admitting: Family Medicine

## 2017-02-15 DIAGNOSIS — S299XXA Unspecified injury of thorax, initial encounter: Secondary | ICD-10-CM

## 2017-02-15 MED ORDER — LORAZEPAM 1 MG PO TABS: 1.0000 mg | ORAL_TABLET | Freq: Three times a day (TID) | ORAL | 0 refills | Status: DC | PRN

## 2017-02-15 NOTE — Telephone Encounter (Signed)
Relation to RR:NHAF  Call back number:[phone] Pharmacy: [facility], [place] [phone] (Phone) [phone] (Fax)     Reason for call:  Patient requesting a refill LORazepam (ATIVAN) 1 MG tablet, patient states she's completley out

## 2017-02-15 NOTE — Telephone Encounter (Addendum)
error:[id number] ° °

## 2017-02-15 NOTE — Telephone Encounter (Signed)
Refilled lorazepam see note dated 02/14/17 Faxed hardcopy for lorazepam to [facility]. Called patients home number informed her refill done.

## 2017-02-26 ENCOUNTER — Other Ambulatory Visit: Payer: Self-pay | Admitting: Family Medicine

## 2017-02-26 DIAGNOSIS — Z1231 Encounter for screening mammogram for malignant neoplasm of breast: Secondary | ICD-10-CM

## 2017-03-08 NOTE — Progress Notes (Signed)
Pre visit review using our clinic review tool, if applicable. No additional management support is needed unless otherwise documented below in the visit note. 

## 2017-03-08 NOTE — Progress Notes (Signed)
Subjective:   Anne [name] is a 76 y.o. female who presents for Medicare Annual (Subsequent) preventive examination.  Review of Systems:  No ROS.  Medicare Wellness Visit.  Cardiac Risk Factors include: advanced age (>65mn, >>27women);dyslipidemia;hypertension  Sleep patterns: has difficulty falling asleep and gets up 0 times nightly to void. Takes Ativan and Elavil at night. Home Safety/Smoke Alarms: Feels safe in home. Smoke alarms in place.   Living environment; residence and Firearm Safety: Lives w/ husband, Anne [name] 2-story house, no firearms.   Counseling:   Eye Exam- Follows w/ Dr. GKaty [name]   Dental- Follows w/ Dr. MLoyal Gamblerand Dr. NMariea [name] She has had extensive dental work and surgery since July of 2017 w/ 4 dental implants and new dentures.  Female:   Pap- Aged out       Mammo- last 03/27/16. BI-RADS CATEGORY  1: Negative.       Dexa scan- last 03/15/16. Low bone mass.       CCS- last 09/05/10. AVMs in the cecum, otherwise normal. 10 year recall.     Objective:     Vitals: BP 124/84   Pulse 90   Temp 98.1 F (36.7 C) (Oral)   Resp 16   Ht 5' 5.5" (1.664 m)   Wt 140 lb (63.5 kg)   SpO2 98%   BMI 22.94 kg/m   Body mass index is 22.94 kg/m.  Wt Readings from Last 3 Encounters:  03/11/17 140 lb (63.5 kg)  12/20/16 136 lb 1.9 oz (61.7 kg)  09/20/16 134 lb 4.8 oz (60.9 kg)   Tobacco History  Smoking Status  . Former Smoker  . Years: 50.00  . Types: Cigarettes  . Quit date: 05/22/2007  Smokeless Tobacco  . Never Used     Counseling given: Not Answered   Past Medical History:  Diagnosis Date  . ALKALINE PHOSPHATASE, ELEVATED 03/15/2009  . Allergic state 06/10/2012  . Anemia   . Anxiety and depression 04/28/2011  . Arthritis   . Atypical chest pain 11/30/2011  . 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 Baker's cyst of knee 05/22/2011  . Cancer ([facility] [place],  08   XRT/chemo 01-02/ lobular invasive ca  . Carotid artery disease ([facility]    a. Carotid  duplex 03/2014: stable 1-39% BICA, f/u due 03/2016.  .Marland KitchenChronic alcoholism in remission ([facility] 03/29/2011   Did not tolerate Klonopin, caused some confusion and bad dreams.    .Marland KitchenCOPD (chronic obstructive pulmonary disease) ([facility]   . Dermatitis 11/23/2012  . Dysphagia   . EE (eosinophilic esophagitis)   . Elevated sed rate 08/02/2013  . Esophageal ring   . ESOPHAGEAL STRICTURE 03/29/2009  . Fall 11/23/2012  . Family history of breast cancer   . Family history of colon cancer   . Family history of ovarian cancer   . Family history of pancreatic cancer   . Folliculitis of nose 26/56/8127 . GERD (gastroesophageal reflux disease) 09/29/2009   improved s/p cholecystectomy and esophagus dilatation  . Hematuria   . History of chicken pox   . History of measles   . History of shingles    2 episodes  . Hx of echocardiogram    a. Echo 01/2013: mild LVH, EF 55-60%, normal wall motion, Gr 1 diast dysfn  . Hyperlipidemia   . Knee pain, bilateral 07/23/2011  . Medicare annual wellness visit, subsequent 06/19/2015  . Mixed hyperlipidemia 10/17/2010   Qualifier: Diagnosis of  By: FMack Guise   .  Orthostasis   . Osteopenia 03/14/2011  . 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 Pneumonia 2-10   pleurisy  . PVC's (premature ventricular contractions)    a. Event monitor 01/2013: NSR, extensive PVCs.  . Radial neck fracture 10/2011   minimally displaced  . Sinusitis, acute 12/04/2015  . Trauma 10/18/2011  . URI (upper respiratory infection) 09/02/2012  . Urinary frequency 12/12/2014  . Urinary incontinence 03/19/2012  . Vaginitis 05/22/2011   Past Surgical History:  Procedure Laterality Date  . ANTERIOR CRUCIATE LIGAMENT REPAIR  08, 09, 10  . BREAST LUMPECTOMY  2001  . BREAST RECONSTRUCTION  2008, 2009, 2010  . CHOLECYSTECTOMY  2010  . COLONOSCOPY  09/05/10   cecal avm's  . DENTAL SURGERY  05/2016   4 dental implants by Dr. Loyal Gambler.  [name] ERCP  2010    CBD stone extraction   .  ESOPHAGOGASTRODUODENOSCOPY  01/08/2012   Procedure: ESOPHAGOGASTRODUODENOSCOPY (EGD);  Surgeon: Gatha Mayer, MD;  Location: [facility];  Service: Endoscopy;  Laterality: N/A;  . ESOPHAGOGASTRODUODENOSCOPY (EGD) WITH ESOPHAGEAL DILATION  2010, 2012  . LAPAROSCOPIC APPENDECTOMY N/A 08/04/2016   Procedure: APPENDECTOMY LAPAROSCOPIC;  Surgeon: Michael Boston, MD;  Location: [facility];  Service: General;  Laterality: N/A;  . MASTECTOMY MODIFIED RADICAL     , Mastectomy modified radical (08), breast reconstruction, CA lesions excised lateral abd wall 2010  . SAVORY DILATION  01/08/2012   Procedure: SAVORY DILATION;  Surgeon: Gatha Mayer, MD;  Location: [facility];  Service: Endoscopy;  Laterality: N/A;  need xray   Family History  Problem Relation Age of Onset  . Heart disease Father   . Lung cancer Father     smoker  . Cirrhosis Sister     Primary Biliary  . Stroke Maternal Grandmother   . Alcohol abuse Maternal Grandfather   . Heart disease Paternal Grandfather   . Anxiety disorder Sister   . Osteoporosis Sister   . Arthritis Sister     Rheumatoid  . Osteoporosis Sister   . Skin cancer Sister     multiple skin cancers, over 80 excisions.  . Other Mother     tic [place]  . Breast cancer Maternal Aunt     dx in her 85s  . Breast cancer Paternal Aunt     dx in her 27s  . Pancreatic cancer Maternal Uncle     dx in his 95s; smoker  . Breast cancer Maternal Aunt     dx in her 9s  . Breast cancer Maternal Aunt     possible breast cancer dx and died in her 45s  . Ovarian cancer Maternal Aunt 29  . Pancreatic cancer Maternal Aunt     dx in her 77s  . Stomach cancer Maternal Uncle   . Colon cancer Maternal Uncle   . Lung cancer Paternal Aunt   . Breast cancer Cousin     paternal first cousin  . Breast cancer Cousin     maternal first cousin  . Anesthesia problems Neg Hx   . Hypotension Neg Hx   . Malignant hyperthermia Neg Hx   . Pseudochol deficiency Neg Hx    History    Sexual Activity  . Sexual activity: Yes  . Partners: Male    Comment: lives with husband,     Outpatient Encounter Prescriptions as of 03/11/2017  Medication Sig  . acetaminophen (TYLENOL) 500 MG tablet Take 500-1,000 mg by mouth every 6 (six) hours as needed (for pain.).  [name] amitriptyline (  ELAVIL) 150 MG tablet Take 1 tablet (150 mg total) by mouth at bedtime.  [name] aspirin EC 81 MG tablet Take 81 mg by mouth daily.  [name] atorvastatin (LIPITOR) 40 MG tablet TAKE 1 TABLET EVERY DAY  . Cholecalciferol (VITAMIN D3) 1000 UNITS CAPS Take 2 tablets by mouth daily.    . Cyanocobalamin (VITAMIN B 12 PO) Take 1 tablet by mouth daily.  [name] diltiazem (CARDIZEM) 30 MG tablet take 1 tablet by mouth once daily for PALPITATIONS  . ferrous sulfate 325 (65 FE) MG tablet Take 325 mg by mouth daily with breakfast.  . ibuprofen (ADVIL,MOTRIN) 200 MG tablet Take 400-800 mg by mouth every 8 (eight) hours as needed (for pain.).  [name] LORazepam (ATIVAN) 1 MG tablet Take 1 tablet (1 mg total) by mouth every 8 (eight) hours as needed for anxiety.  . pantoprazole (PROTONIX) 40 MG tablet Take 1 tablet (40 mg total) by mouth daily.  . Probiotic Product (PROBIOTIC FORMULA PO) Take 1 capsule by mouth daily.   No facility-administered encounter medications on file as of 03/11/2017.     Activities of Daily Living In your present state of health, do you have any difficulty performing the following activities: 03/11/2017 08/04/2016  Hearing? N N  Vision? N N  Difficulty concentrating or making decisions? N N  Walking or climbing stairs? N N  Dressing or bathing? N N  Doing errands, shopping? N N  Preparing Food and eating ? N -  Using the Toilet? N -  In the past six months, have you accidently leaked urine? N -  Do you have problems with loss of bowel control? N -  Managing your Medications? N -  Managing your Finances? N -  Housekeeping or managing your Housekeeping? N -  Some recent data might be hidden    Patient Care  Team: [name], MD as PCP - General (Family Medicine) Paralee Cancel, MD as Consulting Physician (Orthopedic Surgery) Fay Records, MD as Consulting Physician (Cardiology) Gordy Levan, MD as Consulting Physician (Oncology) Gatha Mayer, MD as Consulting Physician (Gastroenterology) Clent Jacks, MD as Consulting Physician (Ophthalmology) Roney Jaffe, DDS (Oral Surgery) Everlene Other (Dentistry)    Assessment:    Physical assessment deferred to PCP.  Exercise Activities and Dietary recommendations Current Exercise Habits: Structured exercise class, Type of exercise: walking, Frequency (Times/Week): 2  Diet (meal preparation, eat out, water intake, caffeinated beverages, dairy products, fruits and vegetables): Reports she does not eat a lot but is 'addicted' to [employer]-drinks 1 large bottle daily. Snacks on potato chips. Does not eat candy. Husband prepares most meals. Does not eat much meat. Does not drink enough water. Drinks 3 lattes each morning.   Breakfast: cereal-Corn Flakes Lunch: pimento cheese sandwich on low calorie bread w/ potato chips Dinner: vegetables, mac and cheese, stewed apples, salmon.  Goals    . Increase physical activity    . Increase water intake          Decrease soda intake by 1/2 of what you are currently drinking. Decrease to 2 lattes daily and then continue to cut back as you are able.      Fall Risk Fall Risk  03/11/2017 05/10/2016 12/07/2014 10/18/2014 07/31/2013  Falls in the past year? No Yes Yes Yes Yes  Number falls in past yr: - 1 2 or more 1 2 or more  Injury with Fall? - Yes - No -  Risk Factor Category  - - - - High Fall Risk  Risk for fall due to : - - - - Other (Comment)  Risk for fall due to (comments): - - - - pt states BP got to low   Depression Screen PHQ 2/9 Scores 03/11/2017 05/10/2016 12/07/2014 07/31/2013  PHQ - 2 Score 2 0 3 0  PHQ- 9 Score 4 - 7 -     Cognitive Function MMSE - Mini Mental State Exam 03/11/2017    Orientation to time 5  Orientation to Place 5  Registration 3  Attention/ Calculation 5  Recall 3  Language- name 2 objects 2  Language- repeat 1  Language- follow 3 step command 3  Language- read & follow direction 1  Write a sentence 1  Copy design 1  Total score 30        Immunization History  Administered Date(s) Administered  . Influenza Split 08/27/2011, 10/21/2012  . Influenza, High Dose Seasonal PF 08/01/2016  . Influenza,inj,Quad PF,36+ Mos 07/31/2013, 12/07/2014, 11/01/2015  . Tdap 04/08/2013   Screening Tests Health Maintenance  Topic Date Due  . PNA vac Low Risk Adult (1 of 2 - PCV13) 09/09/2007  . INFLUENZA VACCINE  06/12/2017  . MAMMOGRAM  03/27/2018  . COLONOSCOPY  09/05/2020  . TETANUS/TDAP  04/09/2023  . DEXA SCAN  Completed      Plan:    Follow-up w/ PCP as directed.   Create and bring a copy of your advance directives to your next office visit.  Advance Directives education will be scheduled via Emmi.  I have personally reviewed and noted the following in the patient's chart:   . Medical and social history . Use of alcohol, tobacco or illicit drugs  . Current medications and supplements . Functional ability and status . Nutritional status . Physical activity . Advanced directives . List of other physicians . Vitals . Screenings to include cognitive, depression, and falls . Referrals and appointments  In addition, I have reviewed and discussed with patient certain preventive protocols, quality metrics, and best practice recommendations. A written personalized care plan for preventive services as well as general preventive health recommendations were provided to patient.     Dorrene German, RN  03/11/2017

## 2017-03-11 ENCOUNTER — Encounter: Payer: Self-pay | Admitting: Family Medicine

## 2017-03-11 ENCOUNTER — Ambulatory Visit (INDEPENDENT_AMBULATORY_CARE_PROVIDER_SITE_OTHER): Payer: Medicare HMO | Admitting: Family Medicine

## 2017-03-11 VITALS — BP 124/84 | HR 90 | Temp 98.1°F | Resp 16 | Ht 65.5 in | Wt 140.0 lb

## 2017-03-11 DIAGNOSIS — C50911 Malignant neoplasm of unspecified site of right female breast: Secondary | ICD-10-CM | POA: Diagnosis not present

## 2017-03-11 DIAGNOSIS — E782 Mixed hyperlipidemia: Secondary | ICD-10-CM | POA: Diagnosis not present

## 2017-03-11 DIAGNOSIS — K219 Gastro-esophageal reflux disease without esophagitis: Secondary | ICD-10-CM

## 2017-03-11 DIAGNOSIS — K222 Esophageal obstruction: Secondary | ICD-10-CM | POA: Diagnosis not present

## 2017-03-11 DIAGNOSIS — D509 Iron deficiency anemia, unspecified: Secondary | ICD-10-CM

## 2017-03-11 DIAGNOSIS — M8580 Other specified disorders of bone density and structure, unspecified site: Secondary | ICD-10-CM | POA: Diagnosis not present

## 2017-03-11 DIAGNOSIS — M79621 Pain in right upper arm: Secondary | ICD-10-CM | POA: Diagnosis not present

## 2017-03-11 DIAGNOSIS — Z170 Estrogen receptor positive status [ER+]: Secondary | ICD-10-CM | POA: Diagnosis not present

## 2017-03-11 DIAGNOSIS — R350 Frequency of micturition: Secondary | ICD-10-CM

## 2017-03-11 DIAGNOSIS — Z0001 Encounter for general adult medical examination with abnormal findings: Secondary | ICD-10-CM

## 2017-03-11 DIAGNOSIS — K589 Irritable bowel syndrome without diarrhea: Secondary | ICD-10-CM | POA: Diagnosis not present

## 2017-03-11 DIAGNOSIS — S299XXA Unspecified injury of thorax, initial encounter: Secondary | ICD-10-CM

## 2017-03-11 DIAGNOSIS — Z0000 Encounter for general adult medical examination without abnormal findings: Secondary | ICD-10-CM

## 2017-03-11 LAB — COMPREHENSIVE METABOLIC PANEL
ALT: 20 U/L (ref 0–35)
AST: 21 U/L (ref 0–37)
Albumin: 4.3 g/dL (ref 3.5–5.2)
Alkaline Phosphatase: 93 U/L (ref 39–117)
BUN: 16 mg/dL (ref 6–23)
CALCIUM: 9.9 mg/dL (ref 8.4–10.5)
CHLORIDE: 101 meq/L (ref 96–112)
CO2: 29 mEq/L (ref 19–32)
CREATININE: 0.7 mg/dL (ref 0.40–1.20)
GFR: 86.82 mL/min (ref >60.00)
Glucose, Bld: 85 mg/dL (ref 70–99)
Potassium: 3.9 mEq/L (ref 3.5–5.1)
Sodium: 138 mEq/L (ref 135–145)
Total Bilirubin: 0.4 mg/dL (ref 0.2–1.2)
Total Protein: 7.3 g/dL (ref 6.0–8.3)

## 2017-03-11 LAB — LIPID PANEL
CHOL/HDL RATIO: 3
Cholesterol: 184 mg/dL (ref 0–200)
HDL: 71.2 mg/dL (ref >39.00)
LDL CALC: 85 mg/dL (ref 0–99)
NONHDL: 112.81
TRIGLYCERIDES: 138 mg/dL (ref 0.0–149.0)
VLDL: 27.6 mg/dL (ref 0.0–40.0)

## 2017-03-11 LAB — CBC
HCT: 37.1 % (ref 36.0–46.0)
HEMOGLOBIN: 12.2 g/dL (ref 12.0–15.0)
MCHC: 32.7 g/dL (ref 30.0–36.0)
MCV: 95.3 fl (ref 78.0–100.0)
Platelets: 276 10*3/uL (ref 150.0–400.0)
RBC: 3.9 Mil/uL (ref 3.87–5.11)
RDW: 14.4 % (ref 11.5–15.5)
WBC: 6.4 10*3/uL (ref 4.0–10.5)

## 2017-03-11 LAB — URINALYSIS, ROUTINE W REFLEX MICROSCOPIC
BILIRUBIN URINE: NEGATIVE
HGB URINE DIPSTICK: NEGATIVE
KETONES UR: NEGATIVE
NITRITE: NEGATIVE
Specific Gravity, Urine: 1.015 (ref 1.000–1.030)
TOTAL PROTEIN, URINE-UPE24: NEGATIVE
URINE GLUCOSE: NEGATIVE
UROBILINOGEN UA: 0.2 (ref 0.0–1.0)
pH: 7.5 (ref 5.0–8.0)

## 2017-03-11 LAB — TSH: TSH: 3.17 u[IU]/mL (ref 0.35–4.50)

## 2017-03-11 MED ORDER — PNEUMOCOCCAL 13-VAL CONJ VACC IM SUSP: 0.5000 mL | Freq: Once | INTRAMUSCULAR | 0 refills | Status: AC

## 2017-03-11 MED ORDER — AMITRIPTYLINE HCL 150 MG PO TABS: 150.0000 mg | ORAL_TABLET | Freq: Every day | ORAL | 5 refills | Status: DC

## 2017-03-11 MED ORDER — LORAZEPAM 1 MG PO TABS: 1.0000 mg | ORAL_TABLET | Freq: Three times a day (TID) | ORAL | 5 refills | Status: DC | PRN

## 2017-03-11 MED ORDER — TRIAMCINOLONE ACETONIDE 0.1 % EX CREA: 1.0000 "application " | TOPICAL_CREAM | Freq: Two times a day (BID) | CUTANEOUS | 2 refills | Status: DC

## 2017-03-11 NOTE — Assessment & Plan Note (Signed)
Tolerating statin, encouraged heart healthy diet, avoid trans fats, minimize simple carbs and saturated fats. Increase exercise as tolerated 

## 2017-03-11 NOTE — Assessment & Plan Note (Signed)
Increase leafy greens, consider increased lean red meat and using cast iron cookware. Continue to monitor, report any concerns 

## 2017-03-11 NOTE — Assessment & Plan Note (Signed)
Patient denies any concerning symptoms

## 2017-03-11 NOTE — Assessment & Plan Note (Signed)
Doing much better on current regimen no concerns today.

## 2017-03-11 NOTE — Progress Notes (Signed)
Patient ID: Anne [name], female   DOB: [birth year], 76 y.o.   MRN: [id number]   Subjective:  I acted as a Education administrator for Penni Homans, [place], [place]   Patient ID: Anne [name], female    DOB: 08-Feb-1941, 76 y.o.   MRN: [id number]  Chief Complaint  Patient presents with  . Medicare Wellness    w/ RN   . Annual Exam    Concerned about her weight. Intermitent discomfort under R arm x1 week. Would like refill of triamcinolone cream and paper rx for Prevnar-13    Arm Pain   The incident occurred 5 to 7 days ago. The injury mechanism is unknown. The pain is present in the upper right arm (In the armpit area.). Pertinent negatives include no chest pain.    Patient is in today for a Medicare Wellness Visit with the Health Coach to be followed up with an annual examination by her PCP. Patient has a complaint of pain in her right arm for the past week; in the armpit area. Also wants to discuss ongoing concern about her weight. Patient has a Hx of GERD, IBS, constipation, cartoid artery stenosis, anxiety, osteopenia. Patient has no additional acute concerns noted at this time. She feels well today. She was last seen in the hospital with an acute appendicitis in September of 2017 but has been well since. No recent febrile illness. She has taken Amitriptyline 150 mg po qhs to sleep for years with good results. She denies any concerns or side effects. Denies CP/palp/SOB/HA/congestion/fevers/GI or GU c/o. Taking meds as prescribed  Patient Care Team: [name], MD as PCP - General (Family Medicine) Paralee Cancel, MD as Consulting Physician (Orthopedic Surgery) Fay Records, MD as Consulting Physician (Cardiology) Gordy Levan, MD as Consulting Physician (Oncology) Gatha Mayer, MD as Consulting Physician (Gastroenterology) Clent Jacks, MD as Consulting Physician (Ophthalmology) Roney Jaffe, DDS (Oral Surgery) Everlene Other (Dentistry) Devra Dopp, MD as Referring Physician  (Dermatology)   Past Medical History:  Diagnosis Date  . ALKALINE PHOSPHATASE, ELEVATED 03/15/2009  . Allergic state 06/10/2012  . Anemia   . Anxiety and depression 04/28/2011  . Arthritis   . Atypical chest pain 11/30/2011  . 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 Baker's cyst of knee 05/22/2011  . Cancer ([place]) 01,  08   XRT/chemo 01-02/ lobular invasive ca  . Carotid artery disease ([place])    a. Carotid duplex 03/2014: stable 1-39% BICA, f/u due 03/2016.  [name] Chronic alcoholism in remission ([place]) 03/29/2011   Did not tolerate Klonopin, caused some confusion and bad dreams.    [name] COPD (chronic obstructive pulmonary disease) ([place])   . Dermatitis 11/23/2012  . Dysphagia   . EE (eosinophilic esophagitis)   . Elevated sed rate 08/02/2013  . Esophageal ring   . ESOPHAGEAL STRICTURE 03/29/2009  . Fall 11/23/2012  . Family history of breast cancer   . Family history of colon cancer   . Family history of ovarian cancer   . Family history of pancreatic cancer   . Folliculitis of nose 4/31/5400  . GERD (gastroesophageal reflux disease) 09/29/2009   improved s/p cholecystectomy and esophagus dilatation  . Hematuria   . History of chicken pox   . History of measles   . History of shingles    2 episodes  . Hx of echocardiogram    a. Echo 01/2013: mild LVH, EF 55-60%, normal wall motion, Gr 1 diast dysfn  .  Hyperlipidemia   . Knee pain, bilateral 07/23/2011  . Medicare annual wellness visit, subsequent 06/19/2015  . Mixed hyperlipidemia 10/17/2010   Qualifier: Diagnosis of  By: Mack Guise    . Orthostasis   . Osteopenia 03/14/2011  . 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 Pneumonia 2-10   pleurisy  . PVC's (premature ventricular contractions)    a. Event monitor 01/2013: NSR, extensive PVCs.  . Radial neck fracture 10/2011   minimally displaced  . Sinusitis, acute 12/04/2015  . Trauma 10/18/2011  . URI (upper respiratory infection) 09/02/2012  . Urinary frequency 12/12/2014    . Urinary incontinence 03/19/2012  . Vaginitis 05/22/2011    Past Surgical History:  Procedure Laterality Date  . ANTERIOR CRUCIATE LIGAMENT REPAIR  08, 09, 10  . BREAST LUMPECTOMY  2001  . BREAST RECONSTRUCTION  2008, 2009, 2010  . CHOLECYSTECTOMY  2010  . COLONOSCOPY  09/05/10   cecal avm's  . DENTAL SURGERY  05/2016   4 dental implants by Dr. Loyal Gambler.  [name] ERCP  2010    CBD stone extraction   . ESOPHAGOGASTRODUODENOSCOPY  01/08/2012   Procedure: ESOPHAGOGASTRODUODENOSCOPY (EGD);  Surgeon: Gatha Mayer, MD;  Location: [facility];  Service: Endoscopy;  Laterality: N/A;  . ESOPHAGOGASTRODUODENOSCOPY (EGD) WITH ESOPHAGEAL DILATION  2010, 2012  . LAPAROSCOPIC APPENDECTOMY N/A 08/04/2016   Procedure: APPENDECTOMY LAPAROSCOPIC;  Surgeon: Michael Boston, MD;  Location: [facility];  Service: General;  Laterality: N/A;  . MASTECTOMY MODIFIED RADICAL     , Mastectomy modified radical (08), breast reconstruction, CA lesions excised lateral abd wall 2010  . SAVORY DILATION  01/08/2012   Procedure: SAVORY DILATION;  Surgeon: Gatha Mayer, MD;  Location: [facility];  Service: Endoscopy;  Laterality: N/A;  need xray    Family History  Problem Relation Age of Onset  . Heart disease Father   . Lung cancer Father     smoker  . Cirrhosis Sister     Primary Biliary  . Stroke Maternal Grandmother   . Alcohol abuse Maternal Grandfather   . Heart disease Paternal Grandfather   . Anxiety disorder Sister   . Osteoporosis Sister   . Arthritis Sister     Rheumatoid  . Osteoporosis Sister   . Skin cancer Sister     multiple skin cancers, over 67 excisions.  . Other Mother     tic [place]  . Breast cancer Maternal Aunt     dx in her 50s  . Breast cancer Paternal Aunt     dx in her 45s  . Pancreatic cancer Maternal Uncle     dx in his 60s; smoker  . Breast cancer Maternal Aunt     dx in her 67s  . Breast cancer Maternal Aunt     possible breast cancer dx and died in her 46s  . Ovarian  cancer Maternal Aunt 29  . Pancreatic cancer Maternal Aunt     dx in her 60s  . Stomach cancer Maternal Uncle   . Colon cancer Maternal Uncle   . Lung cancer Paternal Aunt   . Breast cancer Cousin     paternal first cousin  . Breast cancer Cousin     maternal first cousin  . Anesthesia problems Neg Hx   . Hypotension Neg Hx   . Malignant hyperthermia Neg Hx   . Pseudochol deficiency Neg Hx     Social History   Social History  . Marital status: Married    Spouse name: N/A  .  Number of children: N/A  . Years of education: N/A   Occupational History  . Not on file.   Social History Main Topics  . Smoking status: Former Smoker    Years: 50.00    Types: Cigarettes    Quit date: 05/22/2007  . Smokeless tobacco: Never Used  . Alcohol use No  . Drug use: No  . Sexual activity: Yes    Partners: Male     Comment: lives with husband,    Other Topics Concern  . Not on file   Social History Narrative  . No narrative on file    Outpatient Medications Prior to Visit  Medication Sig Dispense Refill  . acetaminophen (TYLENOL) 500 MG tablet Take 500-1,000 mg by mouth every 6 (six) hours as needed (for pain.).    [name] aspirin EC 81 MG tablet Take 81 mg by mouth daily.    [name] atorvastatin (LIPITOR) 40 MG tablet TAKE 1 TABLET EVERY DAY 90 tablet 3  . Cholecalciferol (VITAMIN D3) 1000 UNITS CAPS Take 2 tablets by mouth daily.      . Cyanocobalamin (VITAMIN B 12 PO) Take 1 tablet by mouth daily.    [name] diltiazem (CARDIZEM) 30 MG tablet take 1 tablet by mouth once daily for PALPITATIONS 30 tablet 8  . ferrous sulfate 325 (65 FE) MG tablet Take 325 mg by mouth daily with breakfast.    . ibuprofen (ADVIL,MOTRIN) 200 MG tablet Take 400-800 mg by mouth every 8 (eight) hours as needed (for pain.).    [name] pantoprazole (PROTONIX) 40 MG tablet Take 1 tablet (40 mg total) by mouth daily. 30 tablet 3  . Probiotic Product (PROBIOTIC FORMULA PO) Take 1 capsule by mouth daily.    [name] amitriptyline (ELAVIL)  150 MG tablet Take 1 tablet (150 mg total) by mouth at bedtime. 26 tablet 0  . LORazepam (ATIVAN) 1 MG tablet Take 1 tablet (1 mg total) by mouth every 8 (eight) hours as needed for anxiety. 70 tablet 0   No facility-administered medications prior to visit.     Allergies  Allergen Reactions  . Codeine Other (See Comments)    "flu like symptoms"  . Morphine Other (See Comments)    "flu like symptoms"  . Peanut-Containing Drug Products Hives  . Sorbitol Other (See Comments)    GI Issues  . Tomato Diarrhea  . Zofran Other (See Comments)    headache  . Diphenhydramine Hcl Other (See Comments)    "nervous and upset"    Review of Systems  Constitutional: Negative for fever and malaise/fatigue.  HENT: Negative for congestion.   Eyes: Negative for blurred vision.  Respiratory: Negative for cough and shortness of breath.   Cardiovascular: Negative for chest pain, palpitations and leg swelling.  Gastrointestinal: Negative for vomiting.  Musculoskeletal: Negative for back pain.  Skin: Negative for rash.  Neurological: Negative for loss of consciousness and headaches.       Objective:    Physical Exam  Constitutional: She is oriented to person, place, and time. She appears well-developed and well-nourished. No distress.  HENT:  Head: Normocephalic and atraumatic.  Eyes: Conjunctivae are normal.  Neck: Normal range of motion. No thyromegaly present.  Cardiovascular: Normal rate and regular rhythm.   Pulmonary/Chest: Effort normal and breath sounds normal. She has no wheezes.  Abdominal: Soft. Bowel sounds are normal. There is no tenderness.  Musculoskeletal: She exhibits no edema or deformity.  Neurological: She is alert and oriented to person, place, and time.  Skin: Skin  is warm and dry. She is not diaphoretic.  Psychiatric: She has a normal mood and affect.    BP 124/84   Pulse 90   Temp 98.1 F (36.7 C) (Oral)   Resp 16   Ht 5' 5.5" (1.664 m)   Wt 140 lb (63.5 kg)    SpO2 98%   BMI 22.94 kg/m  Wt Readings from Last 3 Encounters:  03/11/17 140 lb (63.5 kg)  12/20/16 136 lb 1.9 oz (61.7 kg)  09/20/16 134 lb 4.8 oz (60.9 kg)   BP Readings from Last 3 Encounters:  03/11/17 124/84  12/20/16 102/60  09/20/16 (!) 96/59     Immunization History  Administered Date(s) Administered  . Influenza Split 08/27/2011, 10/21/2012  . Influenza, High Dose Seasonal PF 08/01/2016  . Influenza,inj,Quad PF,36+ Mos 07/31/2013, 12/07/2014, 11/01/2015  . Tdap 04/08/2013    Health Maintenance  Topic Date Due  . PNA vac Low Risk Adult (1 of 2 - PCV13) 09/09/2007  . INFLUENZA VACCINE  06/12/2017  . MAMMOGRAM  03/27/2018  . COLONOSCOPY  09/05/2020  . TETANUS/TDAP  04/09/2023  . DEXA SCAN  Completed    Lab Results  Component Value Date   WBC 6.7 09/20/2016   HGB 12.5 09/20/2016   HCT 38.8 09/20/2016   PLT 247 09/20/2016   GLUCOSE 99 09/20/2016   CHOL 175 03/02/2016   TRIG 92.0 03/02/2016   HDL 74.00 03/02/2016   LDLDIRECT 117.4 01/13/2013   LDLCALC 82 03/02/2016   ALT 18 09/20/2016   AST 18 09/20/2016   NA 141 09/20/2016   K 3.6 09/20/2016   CL 104 08/03/2016   CREATININE 0.8 09/20/2016   BUN 17.0 09/20/2016   CO2 26 09/20/2016   TSH 2.85 03/02/2016   INR 1.0 03/29/2011    Lab Results  Component Value Date   TSH 2.85 03/02/2016   Lab Results  Component Value Date   WBC 6.7 09/20/2016   HGB 12.5 09/20/2016   HCT 38.8 09/20/2016   MCV 95.6 09/20/2016   PLT 247 09/20/2016   Lab Results  Component Value Date   NA 141 09/20/2016   K 3.6 09/20/2016   CHLORIDE 105 09/20/2016   CO2 26 09/20/2016   GLUCOSE 99 09/20/2016   BUN 17.0 09/20/2016   CREATININE 0.8 09/20/2016   BILITOT 0.30 09/20/2016   ALKPHOS 121 09/20/2016   AST 18 09/20/2016   ALT 18 09/20/2016   PROT 7.2 09/20/2016   ALBUMIN 3.7 09/20/2016   CALCIUM 9.8 09/20/2016   ANIONGAP 10 09/20/2016   EGFR 77 (L) 09/20/2016   GFR 81.66 03/02/2016   Lab Results  Component Value  Date   CHOL 175 03/02/2016   Lab Results  Component Value Date   HDL 74.00 03/02/2016   Lab Results  Component Value Date   LDLCALC 82 03/02/2016   Lab Results  Component Value Date   TRIG 92.0 03/02/2016   Lab Results  Component Value Date   CHOLHDL 2 03/02/2016   No results found for: HGBA1C       Assessment & Plan:   Problem List Items Addressed This Visit    Mixed hyperlipidemia    Tolerating statin, encouraged heart healthy diet, avoid trans fats, minimize simple carbs and saturated fats. Increase exercise as tolerated      Relevant Orders   Lipid panel   ESOPHAGEAL STRICTURE    As long as she eats small bites, take pills one at a time with plenty and frequent sips of water and  then if symptoms return let us know      GERD    Patient denies any concerning symptoms      Relevant Orders   CBC   Comprehensive metabolic panel   TSH   Anemia    Increase leafy greens, consider increased lean red meat and using cast iron cookware. Continue to monitor, report any concerns      Preventative health care    Patient encouraged to maintain heart healthy diet, regular exercise, adequate sleep. Consider daily probiotics. Take medications as prescribed. Given and reviewed copy of ACP documents from [employer] and encouraged to complete and return. Given rx for prevnar to take to pharmacy      Breast cancer, right breast [facility])    Follows with oncology, MGM is scheduled for May 2018. Has noted some mild discomfort in right axillae upon awakening for about a week, pain does not persist during the whole day. She will report persistent or worsening pain. Otherwise proceed with MGM as scheduled      Relevant Medications   LORazepam (ATIVAN) 1 MG tablet   Urinary frequency   Relevant Orders   Urinalysis   IBS (irritable bowel syndrome)    Doing much better on current regimen no concerns today.      Osteopenia determined by x-ray    Encouraged to get  adequate exercise, calcium and vitamin d intake       Other Visit Diagnoses    Encounter for Medicare annual wellness exam    -  Primary   Chest trauma, initial encounter       Relevant Medications   LORazepam (ATIVAN) 1 MG tablet      I am having Ms. Alf start on triamcinolone cream and pneumococcal 13-valent conjugate vaccine. I am also having her maintain her Vitamin D3, Probiotic Product (PROBIOTIC FORMULA PO), atorvastatin, Cyanocobalamin (VITAMIN B 12 PO), pantoprazole, ferrous sulfate, aspirin EC, ibuprofen, acetaminophen, diltiazem, LORazepam, and amitriptyline.  Meds ordered this encounter  Medications  . triamcinolone cream (KENALOG) 0.1 %    Sig: Apply 1 application topically 2 (two) times daily.    Dispense:  45 g    Refill:  2  . LORazepam (ATIVAN) 1 MG tablet    Sig: Take 1 tablet (1 mg total) by mouth every 8 (eight) hours as needed for anxiety.    Dispense:  70 tablet    Refill:  5  . amitriptyline (ELAVIL) 150 MG tablet    Sig: Take 1 tablet (150 mg total) by mouth at bedtime.    Dispense:  30 tablet    Refill:  5    Dispense only Sandoz version  . pneumococcal 13-valent conjugate vaccine (PREVNAR 13) SUSP injection    Sig: Inject 0.5 mLs into the muscle once.    Dispense:  0.5 mL    Refill:  0    CMA served as scribe during this visit. History, Physical and Plan performed by medical provider. Documentation and orders reviewed and attested to.  Penni Homans, MD

## 2017-03-11 NOTE — Assessment & Plan Note (Signed)
Patient encouraged to maintain heart healthy diet, regular exercise, adequate sleep. Consider daily probiotics. Take medications as prescribed. Given and reviewed copy of ACP documents from [employer] and encouraged to complete and return. Given rx for prevnar to take to pharmacy

## 2017-03-11 NOTE — Assessment & Plan Note (Signed)
As long as she eats small bites, take pills one at a time with plenty and frequent sips of water and then if symptoms return let us know

## 2017-03-11 NOTE — Patient Instructions (Addendum)
Anne [name] , Thank you for taking time to come for your Medicare Wellness Visit. I appreciate your ongoing commitment to your health goals. Please review the following plan we discussed and let me know if I can assist you in the future.   Create and bring a copy of your advance directives to your next office visit.  These are the goals we discussed: Goals    . Increase physical activity    . Increase water intake          Decrease soda intake by 1/2 of what you are currently drinking. Decrease to 2 lattes daily and then continue to cut back as you are able.       This is a list of the screening recommended for you and due dates:  Health Maintenance  Topic Date Due  . Pneumonia vaccines (1 of 2 - PCV13) 09/09/2007  . Flu Shot  06/12/2017  . Mammogram  03/27/2018  . Colon Cancer Screening  09/05/2020  . Tetanus Vaccine  04/09/2023  . DEXA scan (bone density measurement)  Completed    DASH or MIND diet  For medication disposal: [email or web address]   Preventive Care 65 Years and Older, Female Preventive care refers to lifestyle choices and visits with your health care provider that can promote health and wellness. What does preventive care include?  A yearly physical exam. This is also called an annual well check.  Dental exams once or twice a year.  Routine eye exams. Ask your health care provider how often you should have your eyes checked.  Personal lifestyle choices, including:  Daily care of your teeth and gums.  Regular physical activity.  Eating a healthy diet.  Avoiding tobacco and drug use.  Limiting alcohol use.  Practicing safe sex.  Taking low-dose aspirin every day.  Taking vitamin and mineral supplements as recommended by your health care provider. What happens during an annual well check? The services and screenings done by your health care provider during your annual well check will  depend on your age, overall health, lifestyle risk factors, and family history of disease. Counseling  Your health care provider may ask you questions about your:  Alcohol use.  Tobacco use.  Drug use.  Emotional well-being.  Home and relationship well-being.  Sexual activity.  Eating habits.  History of falls.  Memory and ability to understand (cognition).  Work and work Statistician.  Reproductive health. Screening  You may have the following tests or measurements:  Height, weight, and BMI.  Blood pressure.  Lipid and cholesterol levels. These may be checked every 5 years, or more frequently if you are over 41 years old.  Skin check.  Lung cancer screening. You may have this screening every year starting at age 56 if you have a 30-pack-year history of smoking and currently smoke or have quit within the past 15 years.  Fecal occult blood test (FOBT) of the stool. You may have this test every year starting at age 44.  Flexible sigmoidoscopy or colonoscopy. You may have a sigmoidoscopy every 5 years or a colonoscopy every 10 years starting at age 21.  Hepatitis C blood test.  Hepatitis B blood test.  Sexually transmitted disease (STD) testing.  Diabetes screening. This is done by checking your blood sugar (glucose) after you have not eaten for a while (fasting). You may have this done every 1-3 years.  Bone density scan. This is done to screen for osteoporosis. You may have this done starting at age 20.  Mammogram. This may be done every 1-2 years. Talk to your health care provider about how often you should have regular mammograms. Talk with your health care provider about your test results, treatment options, and if necessary, the need for more tests. Vaccines  Your health care provider may recommend certain vaccines, such as:  Influenza vaccine. This is recommended every year.  Tetanus, diphtheria, and acellular pertussis (Tdap, Td) vaccine. You may need a  Td booster every 10 years.  Varicella vaccine. You may need this if you have not been vaccinated.  Zoster vaccine. You may need this after age 77.  Measles, mumps, and rubella (MMR) vaccine. You may need at least one dose of MMR if you were born in 1957 or later. You may also need a second dose.  Pneumococcal 13-valent conjugate (PCV13) vaccine. One dose is recommended after age 47.  Pneumococcal polysaccharide (PPSV23) vaccine. One dose is recommended after age 70.  Meningococcal vaccine. You may need this if you have certain conditions.  Hepatitis A vaccine. You may need this if you have certain conditions or if you travel or work in places where you may be exposed to hepatitis A.  Hepatitis B vaccine. You may need this if you have certain conditions or if you travel or work in places where you may be exposed to hepatitis B.  Haemophilus influenzae type b (Hib) vaccine. You may need this if you have certain conditions. Talk to your health care provider about which screenings and vaccines you need and how often you need them. This information is not intended to replace advice given to you by your health care provider. Make sure you discuss any questions you have with your health care provider. Document Released: 11/25/2015 Document Revised: 07/18/2016 Document Reviewed: 08/30/2015 Elsevier Interactive Patient Education  2017 [employer].

## 2017-03-11 NOTE — Assessment & Plan Note (Signed)
Encouraged to get adequate exercise, calcium and vitamin d intake 

## 2017-03-11 NOTE — Progress Notes (Signed)
Pre visit review using our clinic review tool, if applicable. No additional management support is needed unless otherwise documented below in the visit note. 

## 2017-03-11 NOTE — Assessment & Plan Note (Signed)
Follows with oncology, MGM is scheduled for May 2018. Has noted some mild discomfort in right axillae upon awakening for about a week, pain does not persist during the whole day. She will report persistent or worsening pain. Otherwise proceed with MGM as scheduled

## 2017-03-12 ENCOUNTER — Other Ambulatory Visit: Payer: Self-pay | Admitting: Emergency Medicine

## 2017-03-12 ENCOUNTER — Other Ambulatory Visit: Payer: Medicare HMO

## 2017-03-12 DIAGNOSIS — R82998 Other abnormal findings in urine: Secondary | ICD-10-CM

## 2017-03-12 DIAGNOSIS — R8299 Other abnormal findings in urine: Secondary | ICD-10-CM | POA: Diagnosis not present

## 2017-03-12 DIAGNOSIS — N390 Urinary tract infection, site not specified: Secondary | ICD-10-CM | POA: Diagnosis not present

## 2017-03-13 LAB — URINE CULTURE

## 2017-03-18 ENCOUNTER — Other Ambulatory Visit: Payer: Self-pay | Admitting: Family

## 2017-03-19 NOTE — Telephone Encounter (Signed)
Refill sent per [facility] refill protocol/SLS  

## 2017-03-21 ENCOUNTER — Other Ambulatory Visit (HOSPITAL_BASED_OUTPATIENT_CLINIC_OR_DEPARTMENT_OTHER): Payer: Medicare HMO

## 2017-03-21 ENCOUNTER — Encounter: Payer: Self-pay | Admitting: Hematology

## 2017-03-21 ENCOUNTER — Telehealth: Payer: Self-pay | Admitting: Hematology

## 2017-03-21 ENCOUNTER — Ambulatory Visit (HOSPITAL_BASED_OUTPATIENT_CLINIC_OR_DEPARTMENT_OTHER): Payer: Medicare HMO | Admitting: Hematology

## 2017-03-21 VITALS — BP 109/68 | HR 88 | Temp 98.1°F | Resp 18 | Ht 65.5 in | Wt 138.4 lb

## 2017-03-21 DIAGNOSIS — Z853 Personal history of malignant neoplasm of breast: Secondary | ICD-10-CM | POA: Insufficient documentation

## 2017-03-21 DIAGNOSIS — D649 Anemia, unspecified: Secondary | ICD-10-CM

## 2017-03-21 DIAGNOSIS — M8580 Other specified disorders of bone density and structure, unspecified site: Secondary | ICD-10-CM | POA: Diagnosis not present

## 2017-03-21 LAB — COMPREHENSIVE METABOLIC PANEL
ALK PHOS: 97 U/L (ref 40–150)
ALT: 20 U/L (ref 0–55)
ANION GAP: 9 meq/L (ref 3–11)
AST: 20 U/L (ref 5–34)
Albumin: 3.7 g/dL (ref 3.5–5.0)
BUN: 13.8 mg/dL (ref 7.0–26.0)
CALCIUM: 9.4 mg/dL (ref 8.4–10.4)
CO2: 28 mEq/L (ref 22–29)
Chloride: 106 mEq/L (ref 98–109)
Creatinine: 0.8 mg/dL (ref 0.6–1.1)
EGFR: 77 mL/min/{1.73_m2} — AB (ref >90)
Glucose: 78 mg/dl (ref 70–140)
POTASSIUM: 3.8 meq/L (ref 3.5–5.1)
Sodium: 143 mEq/L (ref 136–145)
Total Bilirubin: 0.52 mg/dL (ref 0.20–1.20)
Total Protein: 6.8 g/dL (ref 6.4–8.3)

## 2017-03-21 LAB — CBC WITH DIFFERENTIAL/PLATELET
BASO%: 0.5 % (ref 0.0–2.0)
BASOS ABS: 0 10*3/uL (ref 0.0–0.1)
EOS ABS: 0.2 10*3/uL (ref 0.0–0.5)
EOS%: 3.8 % (ref 0.0–7.0)
HEMATOCRIT: 35.7 % (ref 34.8–46.6)
HEMOGLOBIN: 11.3 g/dL — AB (ref 11.6–15.9)
LYMPH#: 1.3 10*3/uL (ref 0.9–3.3)
LYMPH%: 28.7 % (ref 14.0–49.7)
MCH: 31 pg (ref 25.1–34.0)
MCHC: 31.7 g/dL (ref 31.5–36.0)
MCV: 97.8 fL (ref 79.5–101.0)
MONO#: 0.4 10*3/uL (ref 0.1–0.9)
MONO%: 8.6 % (ref 0.0–14.0)
NEUT#: 2.6 10*3/uL (ref 1.5–6.5)
NEUT%: 58.4 % (ref 38.4–76.8)
Platelets: 240 10*3/uL (ref 145–400)
RBC: 3.65 10*6/uL — ABNORMAL LOW (ref 3.70–5.45)
RDW: 14.1 % (ref 11.2–14.5)
WBC: 4.4 10*3/uL (ref 3.9–10.3)

## 2017-03-21 NOTE — Progress Notes (Addendum)
[place]  [phone] [phone]) [phone]  Clinic Follow up Note   Patient Care Team: [name], MD as PCP - General (Family Medicine) Paralee Cancel, MD as Consulting Physician (Orthopedic Surgery) Fay Records, MD as Consulting Physician (Cardiology) Gordy Levan, MD as Consulting Physician (Oncology) Gatha Mayer, MD as Consulting Physician (Gastroenterology) Clent Jacks, MD as Consulting Physician (Ophthalmology) Roney Jaffe, DDS (Oral Surgery) Everlene Other (Dentistry) Renda Rolls, Jennefer Bravo, MD as Referring Physician (Dermatology) 03/21/2017   ONCOLOGIC HISTORY By Dr. Toma Copier History of 2 primary lobular right breast carcinomas. 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 I believe that she had local radiation, tho that information is not available in this EMR.  Second diagnosis was Jan. 2008 (tho patient did not agree to surgery until July 2008), invasive lobular carcinoma ER 81%, PR 96%, Her 2 1+ ([id number]) , 1.5 cm invasive lobular with 3 axillary nodes negative, LVSI and perineural invasion present ([id number]). She subsequently declined adjuvant systemic treatment . She had a complicated course following right mastectomy, with difficult healing after right breast reconstruction.   She had skin recurrence lateral right chest wall excised in Sept 2010 then treated with xeloda from Oct 2010 thru Feb 2011 and began on Arimidex July 2011; she may have stopped Arimidex after bone density scan 02-2014, or possibly the year prior (?).   She has had no documented active disease since this most recent treatment, including PET 05-14-12 with no evidence of metastatic disease. Bone density scan  [facility] 02-15-14 still osteopenic range, slightly lower in LS compared with 2013 and stable in hips.  CURRENT THERAPY: Surveillance  INTERVAL HISTORY: 03/21/17 Anne [name] Is a 76 y.o. female who is here for a follow- up on her two Lobular right breast carcinomas. She presents to the clinic today with her husband.   She was under Dr. Harlin Rain care previously, who has recently retired. This is my first encounter with the patient. She reports to having breast cancer in 2001 and again in 2008. The first time she had chemo and tamoxifen and radiation and lumpectomry. She did a gene test last year and it was negative. The send round she had a mastectomy and did not want chemo but took Arimidex but stopped taking it after 3-4 years. She felt tired of taking it after 3-4 years. Lately she feels well. Her husband reminds she had 2-3 nodules on her right skin on the abdomen side. After the mastectomy they did not get reconstruction just a tissue expander. She reports to hating the Tissue expander. She reports to seeing doctors for another opinions. Her skin is thin. Dr. Baltazar Apo said she is not likely to get reconstruction. [facility] as well had a response, they said no to reconstruction. She would like for a picture of her breast to be sent to the next surgeon to see if any reconstruction can be done. She stopped smoking after 50 years. She reports to be Dx with mild COPD in 2010.  Today she reports to feeling well and sometime she experiences pain that really is not there. She thinks the nerves on her right side is coming back. She does work outside on acres of garden and will get back pain. She reports having an  allergy and working on the garden and her allergies will flare up. She goes to the Breast clinic for her mammogram and [facility] for her bone density scan. She goes to Dr. Maryellen Pile, PCP, at [facility] in [place] yearly.    REVIEW OF SYSTEMS:   Constitutional: Denies fevers, chills or abnormal weight  loss Eyes: Denies blurriness of vision Ears, nose, mouth, throat, and face: Denies mucositis or sore throat (+) allergies  Respiratory: Denies cough, dyspnea or wheezes Cardiovascular: Denies palpitation, chest discomfort or lower extremity swelling Gastrointestinal:  Denies nausea, heartburn or change in bowel habits Skin: Denies abnormal skin rashes Lymphatics: Denies new lymphadenopathy or easy bruising Neurological:Denies numbness, tingling or new weaknesses Behavioral/Psych: Mood is stable, no new changes  MSK: (+) Mild Arthritis All other systems were reviewed with the patient and are negative. Breast: (+) infrequent shooting pain in the right breast   MEDICAL HISTORY:  Past Medical History:  Diagnosis Date  . ALKALINE PHOSPHATASE, ELEVATED 03/15/2009  . Allergic state 06/10/2012  . Anemia   . Anxiety and depression 04/28/2011  . Arthritis   . Atypical chest pain 11/30/2011  . 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 Baker's cyst of knee 05/22/2011  . Cancer ([place]) 01,  08   XRT/chemo 01-02/ lobular invasive ca  . Carotid artery disease ([place])    a. Carotid duplex 03/2014: stable 1-39% BICA, f/u due 03/2016.  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    [name] COPD (chronic obstructive pulmonary disease) ([place])   . Dermatitis 11/23/2012  . Dysphagia   . EE (eosinophilic esophagitis)   . Elevated sed rate 08/02/2013  . Esophageal ring   . ESOPHAGEAL STRICTURE 03/29/2009  . Fall 11/23/2012  . Family history of breast cancer   . Family history of colon cancer   . Family history of ovarian cancer   . Family history of pancreatic cancer   . Folliculitis of nose 3/41/9379  . GERD (gastroesophageal reflux disease) 09/29/2009   improved s/p cholecystectomy and esophagus dilatation  . Hematuria   . History of chicken pox   . History of measles   . History of shingles    2 episodes  . Hx of  echocardiogram    a. Echo 01/2013: mild LVH, EF 55-60%, normal wall motion, Gr 1 diast dysfn  . Hyperlipidemia   . Knee pain, bilateral 07/23/2011  . Medicare annual wellness visit, subsequent 06/19/2015  . Mixed hyperlipidemia 10/17/2010   Qualifier: Diagnosis of  By: Mack Guise    . Orthostasis   . Osteopenia 03/14/2011  . 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 Pneumonia 2-10   pleurisy  . PVC's (premature ventricular contractions)    a. Event monitor 01/2013: NSR, extensive PVCs.  . Radial neck fracture 10/2011   minimally displaced  . Sinusitis, acute 12/04/2015  . Trauma 10/18/2011  . URI (upper respiratory infection) 09/02/2012  . Urinary frequency 12/12/2014  . Urinary incontinence 03/19/2012  . Vaginitis 05/22/2011    SURGICAL HISTORY: Past Surgical History:  Procedure Laterality Date  . ANTERIOR CRUCIATE LIGAMENT REPAIR  08, 09, 10  . BREAST LUMPECTOMY  2001  . BREAST RECONSTRUCTION  2008, 2009, 2010  . CHOLECYSTECTOMY  2010  . COLONOSCOPY  09/05/10   cecal avm's  . DENTAL SURGERY  05/2016   4 dental implants by Dr. Loyal Gambler.  [name] ERCP  2010    CBD stone extraction   .  ESOPHAGOGASTRODUODENOSCOPY  01/08/2012   Procedure: ESOPHAGOGASTRODUODENOSCOPY (EGD);  Surgeon: Gatha Mayer, MD;  Location: [facility];  Service: Endoscopy;  Laterality: N/A;  . ESOPHAGOGASTRODUODENOSCOPY (EGD) WITH ESOPHAGEAL DILATION  2010, 2012  . LAPAROSCOPIC APPENDECTOMY N/A 08/04/2016   Procedure: APPENDECTOMY LAPAROSCOPIC;  Surgeon: Michael Boston, MD;  Location: [facility];  Service: General;  Laterality: N/A;  . MASTECTOMY MODIFIED RADICAL     , Mastectomy modified radical (08), breast reconstruction, CA lesions excised lateral abd wall 2010  . SAVORY DILATION  01/08/2012   Procedure: SAVORY DILATION;  Surgeon: Gatha Mayer, MD;  Location: [facility];  Service: Endoscopy;  Laterality: N/A;  need xray    I have reviewed the social history and family history with the patient and they are unchanged  from previous note.  ALLERGIES:  is allergic to codeine; morphine; peanut-containing drug products; sorbitol; tomato; zofran; and diphenhydramine hcl.  MEDICATIONS:  Current Outpatient Prescriptions  Medication Sig Dispense Refill  . acetaminophen (TYLENOL) 500 MG tablet Take 500-1,000 mg by mouth every 6 (six) hours as needed (for pain.).    [name] amitriptyline (ELAVIL) 150 MG tablet Take 1 tablet (150 mg total) by mouth at bedtime. 30 tablet 5  . aspirin EC 81 MG tablet Take 81 mg by mouth daily.    [name] atorvastatin (LIPITOR) 40 MG tablet TAKE 1 TABLET EVERY DAY 90 tablet 3  . Cholecalciferol (VITAMIN D3) 1000 UNITS CAPS Take 2 tablets by mouth daily.      . Cyanocobalamin (VITAMIN B 12 PO) Take 1 tablet by mouth daily.    [name] diltiazem (CARDIZEM) 30 MG tablet take 1 tablet by mouth once daily for PALPITATIONS 30 tablet 8  . ferrous sulfate 325 (65 FE) MG tablet Take 325 mg by mouth daily with breakfast.    . ibuprofen (ADVIL,MOTRIN) 200 MG tablet Take 400-800 mg by mouth every 8 (eight) hours as needed (for pain.).    [name] LORazepam (ATIVAN) 1 MG tablet Take 1 tablet (1 mg total) by mouth every 8 (eight) hours as needed for anxiety. 70 tablet 5  . pantoprazole (PROTONIX) 40 MG tablet take 1 tablet by mouth once daily 30 tablet 3  . Probiotic Product (PROBIOTIC FORMULA PO) Take 1 capsule by mouth daily.    [name] triamcinolone cream (KENALOG) 0.1 % Apply 1 application topically 2 (two) times daily. 45 g 2   No current facility-administered medications for this visit.     PHYSICAL EXAMINATION: ECOG PERFORMANCE STATUS: 0 - Asymptomatic  Vitals:   03/21/17 1314  BP: 109/68  Pulse: 88  Resp: 18  Temp: 98.1 F (36.7 C)   Filed Weights   03/21/17 1314  Weight: 138 lb 6.4 oz (62.8 kg)    GENERAL:alert, no distress and comfortable SKIN: skin color, texture, turgor are normal, no rashes or significant lesions EYES: normal, Conjunctiva are pink and non-injected, sclera clear OROPHARYNX:no exudate,  no erythema and lips, buccal mucosa, and tongue normal  NECK: supple, thyroid normal size, non-tender, without nodularity LYMPH:  no palpable lymphadenopathy in the cervical, axillary or inguinal LUNGS: clear to auscultation and percussion with normal breathing effort HEART: regular rate & rhythm and no murmurs and no lower extremity edema ABDOMEN:abdomen soft, non-tender and normal bowel sounds Musculoskeletal:no cyanosis of digits and no clubbing  NEURO: alert & oriented x 3 with fluent speech, no focal motor/sensory deficits Breasts: Breast inspection showed status post right mastectomy and reconstruction, asymmetrical (see picture below), no nipple discharge. Palpation of the breasts and axilla revealed no  obvious mass that I could appreciate.       LABORATORY DATA:  I have reviewed the data as listed CBC Latest Ref Rng & Units 03/21/2017 03/11/2017 09/20/2016  WBC 3.9 - 10.3 10e3/uL 4.4 6.4 6.7  Hemoglobin 11.6 - 15.9 g/dL 11.3(L) 12.2 12.5  Hematocrit 34.8 - 46.6 % 35.7 37.1 38.8  Platelets 145 - 400 10e3/uL 240 276.0 247     CMP Latest Ref Rng & Units 03/21/2017 03/11/2017 09/20/2016  Glucose 70 - 140 mg/dl 78 85 99  BUN 7.0 - 26.0 mg/dL 13.8 16 17.0  Creatinine 0.6 - 1.1 mg/dL 0.8 0.70 0.8  Sodium 136 - 145 mEq/L 143 138 141  Potassium 3.5 - 5.1 mEq/L 3.8 3.9 3.6  Chloride 96 - 112 mEq/L - 101 -  CO2 22 - 29 mEq/L 28 29 26   Calcium 8.4 - 10.4 mg/dL 9.4 9.9 9.8  Total Protein 6.4 - 8.3 g/dL 6.8 7.3 7.2  Total Bilirubin 0.20 - 1.20 mg/dL 0.52 0.4 0.30  Alkaline Phos 40 - 150 U/L 97 93 121  AST 5 - 34 U/L 20 21 18   ALT 0 - 55 U/L 20 20 18       RADIOGRAPHIC STUDIES: I have personally reviewed the radiological images as listed and agreed with the findings in the report. No results found.     ASSESSMENT & PLAN:  Anne [name] is a 76 y.o. caucasian female with a history of ostepenia, Chronic Urology problems, two lobular right breast carcinomas in 2001 and 2008.   1.  History of recurrent right breast lobular carcinoma, in 2001, 2008 and chest wall recurrence in 2010 -I reviewed her medical records extensively, and comfortable and keep findings with patient -She is clinically doing well, lab reviewed, exam was unremarkable, last mammogram in May 2017 was normal. No clinical concern of recurrence. -She is very unhappy about her cosmetic results of her construction, especially the asymmetry. She was last seen by plastic surgeon Dr. Marla Roe. According to Dr. Eusebio Friendly note, she is at high risk of complications if she redo reconstruction of right side, so she recommended left breast reduction. Pt did not seem to understand and remember that, and I encouraged her to contact Dr. Eusebio Friendly office again if she is interested in left breast reconstruction. However, she would like me to get a second opinion regarding reconstruction.  --Will continue breast cancer surveillance. She is scheduled for annual mammogram next week. I encouraged her to do self exam, and a follow-up with [place] routinely.  2. Osteopenia  -She had a bone density scan in 03/2016 so she will due for another one in 2019 -She'll continue calcium and vitamin D supplement  3. Anemia: -she still has mild anemia, her prior colonoscopy showed AVM in her Cecum, which may contribute to her anemia.  -will repeat her iron study on next visit  -Will continue to monitor with labs    4. history of alcohol and tobacco abuse 5. History of chronic urological problems, follow-up with urology 6. History of esophageal dilatation 7. Anxiety and depression  PLAN:  -lab and f/u in 5-6 months -I will contact plastic surgeon Dr. Iran Planas for a second opinion about her reconstruction.   Orders Placed This Encounter  Procedures  . CBC with Differential    Standing Status:   Standing    Number of Occurrences:   30    Standing Expiration Date:   03/21/2022  . Comprehensive metabolic panel    Standing Status:    Standing  Number of Occurrences:   30    Standing Expiration Date:   03/21/2022   All questions were answered. The patient knows to call the clinic with any problems, questions or concerns. No barriers to learning was detected.  I spent 30 minutes counseling the patient face to face. The total time spent in the appointment was 40 minutes and more than 50% was on counseling and review of test results   This document serves as a record of services personally performed by Truitt Merle, MD. It was created on her behalf by Joslyn Devon, a trained medical scribe. The creation of this record is based on the scribe's personal observations and the provider's statements to them. This document has been checked and approved by the attending provider.     Truitt Merle, MD 03/21/2017 1:31 PM    Addendum I contacted breast surgeon Dr. Iran Planas and shared her breast picture with her. She has had saline implant adjustable with remote port on right, and she has already had one reduction/lift on left side. After reviewing her records,and pictures, she agrees with Dr. Marla Roe, the risk of redo reconstruction of right breast is too high, but can consider second left breast reduction to improve symmetry. I have informed the pt and encouraged her to contact Dr. Eusebio Friendly office if she is interested in having left breast reduction and lift again.  Truitt Merle  03/23/2017

## 2017-03-21 NOTE — Telephone Encounter (Signed)
Gave patient AVS and calender per 5/10 los. Lab and f/u in 5-6 months.

## 2017-03-23 ENCOUNTER — Encounter: Payer: Self-pay | Admitting: Hematology

## 2017-03-28 ENCOUNTER — Ambulatory Visit
Admission: RE | Admit: 2017-03-28 | Discharge: 2017-03-28 | Disposition: A | Discharge status: Home / Self Care | Payer: Medicare HMO | Source: Ambulatory Visit | Attending: Family Medicine | Admitting: Family Medicine

## 2017-03-28 DIAGNOSIS — Z1231 Encounter for screening mammogram for malignant neoplasm of breast: Secondary | ICD-10-CM | POA: Diagnosis not present

## 2017-03-28 HISTORY — DX: Personal history of irradiation: Z92.3

## 2017-03-28 HISTORY — DX: Personal history of antineoplastic chemotherapy: Z92.21

## 2017-04-10 ENCOUNTER — Encounter: Payer: Self-pay | Admitting: Family Medicine

## 2017-04-10 ENCOUNTER — Other Ambulatory Visit: Payer: Self-pay | Admitting: Family Medicine

## 2017-04-10 DIAGNOSIS — R1310 Dysphagia, unspecified: Secondary | ICD-10-CM

## 2017-04-17 ENCOUNTER — Telehealth: Payer: Self-pay | Admitting: *Deleted

## 2017-04-17 NOTE — Telephone Encounter (Signed)
Received call from pt stating that she would like to proceed with seeing surgeon per Dr Ernestina Penna suggestion.  Message to Dr Burr Medico.

## 2017-04-19 ENCOUNTER — Ambulatory Visit: Payer: Medicare HMO | Admitting: Physician Assistant

## 2017-04-24 ENCOUNTER — Telehealth: Payer: Self-pay | Admitting: Internal Medicine

## 2017-04-24 NOTE — Telephone Encounter (Signed)
Patient reports that she is having dysphagia.  She is scheduled to see Ellouise Newer, PA on 05/01/17.  I advised her that if there is an earlier opening I will give her a call./

## 2017-04-24 NOTE — Telephone Encounter (Signed)
Best callback # now is [phone]

## 2017-04-24 NOTE — Telephone Encounter (Signed)
Called pt today & informed to call Dr Dillingham's office @ [phone] per Dr Ernestina Penna instructions.  She expressed understanding & appreciation for call.

## 2017-05-01 ENCOUNTER — Encounter: Payer: Self-pay | Admitting: Nurse Practitioner

## 2017-05-01 ENCOUNTER — Encounter: Payer: Self-pay | Admitting: Internal Medicine

## 2017-05-01 ENCOUNTER — Ambulatory Visit (INDEPENDENT_AMBULATORY_CARE_PROVIDER_SITE_OTHER): Payer: Medicare HMO | Admitting: Nurse Practitioner

## 2017-05-01 VITALS — BP 110/70 | HR 84 | Ht 65.5 in | Wt 133.6 lb

## 2017-05-01 DIAGNOSIS — R1310 Dysphagia, unspecified: Secondary | ICD-10-CM

## 2017-05-01 DIAGNOSIS — K200 Eosinophilic esophagitis: Secondary | ICD-10-CM | POA: Diagnosis not present

## 2017-05-01 MED ORDER — NONFORMULARY OR COMPOUNDED ITEM: 2 refills | Status: DC

## 2017-05-01 NOTE — Patient Instructions (Signed)
If you are age 75 or older, your body mass index should be between 23-30. Your Body mass index is 21.89 kg/m. If this is out of the aforementioned range listed, please consider follow up with your Primary Care Provider.  If you are age 20 or younger, your body mass index should be between 19-25. Your Body mass index is 21.89 kg/m. If this is out of the aformentioned range listed, please consider follow up with your Primary Care Provider.   You have been scheduled for an endoscopy. Please follow written instructions given to you at your visit today. If you use inhalers (even only as needed), please bring them with you on the day of your procedure. Your physician has requested that you go to www.startemmi.com and enter the access code given to you at your visit today. This web site gives a general overview about your procedure. However, you should still follow specific instructions given to you by our office regarding your preparation for the procedure.  We have sent the following medications to your pharmacy for you to pick up at your convenience: Budesonide Slurry  Thank you for choosing me and [facility].   Tye Savoy, NP

## 2017-05-01 NOTE — Progress Notes (Addendum)
HPI: Patient is a 76 year old female known to Dr. [name], last seen Sept 2015. She has a history of eosinophilic esophagitis with esophageal stricturing. Last EGD July 2015 with findings of multiple esophageal rings dilated with Venia Minks 79 [place].  She did well until about a year ago and since then the dysphagia has slowly progressed. She often has problems swallowing at the beginning of a meal. If she stops eating, sits up straight and gives it time the food will usually pass. Sometimes she eats dinner in a separate room from the family in order to concentrate on swallowing. Lately solid food getting stuck in esophagus and will not go down for hours. When this occurs she has increased difficulty managing secretions. On a daily PPI. She does not remember if fluticasone inhaler helped. Vaguely remembers the budesonide slurry, it is possible she did not get it due to cost. No other GI complaints   Past Medical History:  Diagnosis Date  . ALKALINE PHOSPHATASE, ELEVATED 03/15/2009  . Allergic state 06/10/2012  . Anemia   . Anxiety and depression 04/28/2011  . Arthritis   . Atypical chest pain 11/30/2011  . 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 Baker's cyst of knee 05/22/2011  . Cancer ([place]) 01,  08   XRT/chemo 01-02/ lobular invasive ca  . Carotid artery disease ([place])    a. Carotid duplex 03/2014: stable 1-39% BICA, f/u due 03/2016.  [name] Chronic alcoholism in remission ([place]) 03/29/2011   Did not tolerate Klonopin, caused some confusion and bad dreams.    [name] COPD (chronic obstructive pulmonary disease) ([place])   . Dermatitis 11/23/2012  . Dysphagia   . EE (eosinophilic esophagitis)   . Elevated sed rate 08/02/2013  . Esophageal ring   . ESOPHAGEAL STRICTURE 03/29/2009  . Fall 11/23/2012  . Family history of breast cancer   . Family history of colon cancer   . Family history of ovarian cancer   . Family history of pancreatic cancer   . Folliculitis of nose 01/23/9701  . GERD  (gastroesophageal reflux disease) 09/29/2009   improved s/p cholecystectomy and esophagus dilatation  . Hematuria   . History of chicken pox   . History of measles   . History of shingles    2 episodes  . Hx of echocardiogram    a. Echo 01/2013: mild LVH, EF 55-60%, normal wall motion, Gr 1 diast dysfn  . Hyperlipidemia   . Knee pain, bilateral 07/23/2011  . Medicare annual wellness visit, subsequent 06/19/2015  . Mixed hyperlipidemia 10/17/2010   Qualifier: Diagnosis of  By: Mack Guise    . Orthostasis   . Osteopenia 03/14/2011  . Personal history of chemotherapy 2001  . Personal history of radiation therapy 2001   rt breast  . 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 Pneumonia 2-10   pleurisy  . PVC's (premature ventricular contractions)    a. Event monitor 01/2013: NSR, extensive PVCs.  . Radial neck fracture 10/2011   minimally displaced  . Sinusitis, acute 12/04/2015  . Trauma 10/18/2011  . URI (upper respiratory infection) 09/02/2012  . Urinary frequency 12/12/2014  . Urinary incontinence 03/19/2012  . Vaginitis 05/22/2011    Patient's surgical history, family medical history, social history, medications and allergies were all reviewed in Epic    Physical Exam: BP 110/70   Pulse 84   Ht 5' 5.5" (1.664 m)   Wt 133 lb 9.6 oz (60.6 kg)   BMI 21.89 kg/m  GENERAL: Well developed white female in NAD. Here with husband PSYCH: :Pleasant, cooperative, normal affect EENT:  conjunctiva pink, mucous membranes moist, neck supple without masses CARDIAC:  RRR, no peripheral edema PULM: Normal respiratory effort, lungs CTA bilaterally, no wheezing ABDOMEN:  soft, nontender, nondistended, no obvious masses, no hepatomegaly,  normal bowel sounds SKIN:  turgor, no lesions seen Musculoskeletal:  Normal muscle tone, normal strength NEURO: Alert and oriented x 3, no focal neurologic deficits  ASSESSMENT and PLAN:  Pleasant 76 year old female with eosinophilic esophagitis and  esophageal strictures, last dilation July 2015. Now with slowly progressive dysphagia over the last year. Lately solids getting lodged in her esophagus leading to transient problems managing secretions.  -continue daily PPI -Start Budesonide slurry  -She needs EGD with probable dilation. The risks and benefits of EGD were discussed and the patient agrees to proceed.  -Advised patient to eat small bites, chew well with liquids in between bites to avoid food impaction until EGD done.  Tye Savoy , NP 05/01/2017, 8:46 AM   Agree with Ms. Guenther's assessment and plan. Gatha Mayer, MD, [facility]

## 2017-05-09 ENCOUNTER — Other Ambulatory Visit: Payer: Commercial Managed Care - HMO

## 2017-05-09 ENCOUNTER — Ambulatory Visit: Payer: Commercial Managed Care - HMO | Admitting: Hematology

## 2017-05-12 ENCOUNTER — Other Ambulatory Visit: Payer: Self-pay | Admitting: Internal Medicine

## 2017-05-13 ENCOUNTER — Ambulatory Visit (AMBULATORY_SURGERY_CENTER): Payer: Medicare HMO | Admitting: Internal Medicine

## 2017-05-13 ENCOUNTER — Encounter: Payer: Self-pay | Admitting: Internal Medicine

## 2017-05-13 VITALS — BP 121/52 | HR 71 | Temp 95.3°F | Resp 19 | Ht 65.5 in | Wt 133.0 lb

## 2017-05-13 DIAGNOSIS — K200 Eosinophilic esophagitis: Secondary | ICD-10-CM | POA: Diagnosis not present

## 2017-05-13 DIAGNOSIS — R1310 Dysphagia, unspecified: Secondary | ICD-10-CM | POA: Diagnosis present

## 2017-05-13 DIAGNOSIS — K222 Esophageal obstruction: Secondary | ICD-10-CM

## 2017-05-13 MED ORDER — SODIUM CHLORIDE 0.9 % IV SOLN: 500.0000 mL | INTRAVENOUS | Status: DC

## 2017-05-13 NOTE — Patient Instructions (Addendum)
I dilated the esophagus to 13 mm today - I think you may need more dilation like in 2015.  I am recommending you come back in about 2 weeks to repeat this.  You will do better if you take pantoprazole every day no matter what.  The budesonide slurry is available at [facility] and can also help.  I appreciate the opportunity to care for you. Gatha Mayer, MD, FACG   YOU HAD AN ENDOSCOPIC PROCEDURE TODAY AT [facility]:   Refer to the procedure report that was given to you for any specific questions about what was found during the examination.  If the procedure report does not answer your questions, please call your gastroenterologist to clarify.  If you requested that your care partner not be given the details of your procedure findings, then the procedure report has been included in a sealed envelope for you to review at your convenience later.  YOU SHOULD EXPECT: Some feelings of bloating in the abdomen. Passage of more gas than usual.  Walking can help get rid of the air that was put into your GI tract during the procedure and reduce the bloating. If you had a lower endoscopy (such as a colonoscopy or flexible sigmoidoscopy) you may notice spotting of blood in your stool or on the toilet paper. If you underwent a bowel prep for your procedure, you may not have a normal bowel movement for a few days.  Please Note:  You might notice some irritation and congestion in your nose or some drainage.  This is from the oxygen used during your procedure.  There is no need for concern and it should clear up in a day or so.  SYMPTOMS TO REPORT IMMEDIATELY:    Following upper endoscopy (EGD)  Vomiting of blood or coffee ground material  New chest pain or pain under the shoulder blades  Painful or persistently difficult swallowing  New shortness of breath  Fever of 100F or higher  Black, tarry-looking stools  For urgent or emergent issues, a gastroenterologist  can be reached at any hour by calling [phone].   DIET:  Clear liquids for one hour, until 9:00. After 9:00 soft foods until the morning. Resume your regular diet in the am.   ACTIVITY:  You should plan to take it easy for the rest of today and you should NOT DRIVE or use heavy machinery until tomorrow (because of the sedation medicines used during the test).    FOLLOW UP: Our staff will call the number listed on your records the next business day following your procedure to check on you and address any questions or concerns that you may have regarding the information given to you following your procedure. If we do not reach you, we will leave a message.  However, if you are feeling well and you are not experiencing any problems, there is no need to return our call.  We will assume that you have returned to your regular daily activities without incident.  If any biopsies were taken you will be contacted by phone or by letter within the next 1-3 weeks.  Please call us at [phone] if you have not heard about the biopsies in 3 weeks.    SIGNATURES/CONFIDENTIALITY: You and/or your care partner have signed paperwork which will be entered into your electronic medical record.  These signatures attest to the fact that that the information above on your After Visit Summary has been reviewed and  is understood.  Full responsibility of the confidentiality of this discharge information lies with you and/or your care-partner.

## 2017-05-13 NOTE — Progress Notes (Signed)
A/ox3 pleased with MAC, report to Karen RN 

## 2017-05-13 NOTE — Progress Notes (Signed)
Called to room to assist during endoscopic procedure.  Patient ID and intended procedure confirmed with present staff. Received instructions for my participation in the procedure from the performing physician.  

## 2017-05-13 NOTE — Op Note (Signed)
[place] Patient Name: Anne [name] Procedure Date: 05/13/2017 7:39 AM MRN: [id number] Endoscopist: Gatha Mayer , MD Age: 76 Referring MD:  Date of Birth: 1941/09/18 Gender: Female Account #: [id number] Procedure:                Upper GI endoscopy Indications:              Dysphagia, For therapy of esophageal stricture, For                            therapy of eosinophilic esophagitis Medicines:                Propofol per Anesthesia, Monitored Anesthesia Care Procedure:                Pre-Anesthesia Assessment:                           - Prior to the procedure, a History and Physical                            was performed, and patient medications and                            allergies were reviewed. The patient's tolerance of                            previous anesthesia was also reviewed. The risks                            and benefits of the procedure and the sedation                            options and risks were discussed with the patient.                            All questions were answered, and informed consent                            was obtained. Prior Anticoagulants: The patient has                            taken no previous anticoagulant or antiplatelet                            agents. ASA Grade Assessment: II - A patient with                            mild systemic disease. After reviewing the risks                            and benefits, the patient was deemed in                            satisfactory condition to undergo the procedure.  After obtaining informed consent, the endoscope was                            passed under direct vision. Throughout the                            procedure, the patient's blood pressure, pulse, and                            oxygen saturations were monitored continuously. The                            Model GIF-HQ190 [id number]) scope was introduced          through the mouth, and advanced to the prepyloric                            region, stomach. The upper GI endoscopy was                            accomplished without difficulty. The patient                            tolerated the procedure well. Scope In: Scope Out: Findings:                 Mucosal changes including ringed esophagus were                            found in the entire esophagus. The scope was                            withdrawn. Dilation was performed with a Maloney                            dilator with moderate resistance at 40 Fr. Tio open                            the proximal ring at EUS. Scope reinserted showing                            proximal and mid esophageal dilation effect but                            could not pass ring at GE junction. A TTS dilator                            was passed through the scope. Dilation with an                            8.5-9.5-10.5 mm balloon and an 09-23-12 mm balloon                            dilator was performed to 13 mm. The dilation site  was examined and showed moderate improvement in                            luminal narrowing. Scope would now pass into                            stomach. Estimated blood loss was minimal.                           Patchy mildly erythematous mucosa without bleeding                            was found in the gastric antrum.                           The cardia and gastric fundus were normal on                            retroflexion. Complications:            No immediate complications. Estimated Blood Loss:     Estimated blood loss was minimal. Impression:               - Esophageal mucosal changes secondary to                            eosinophilic esophagitis. Dilated.                           - Erythematous mucosa in the antrum.                           - No specimens collected. Recommendation:           - Patient has a contact number  available for                            emergencies. The signs and symptoms of potential                            delayed complications were discussed with the                            patient. Return to normal activities tomorrow.                            Written discharge instructions were provided to the                            patient.                           - Clear liquids x 1 hour then soft foods rest of                            day. Start prior diet tomorrow.                           -  Continue present medications.                           - best to take PPI daily                           Would still try to treat with ingested Budesonide                           Schedule repeat endoscopy and dilation for about 2                            weeks from now Gatha Mayer, MD 05/13/2017 8:09:19 AM This report has been signed electronically.

## 2017-05-13 NOTE — Progress Notes (Signed)
Pt's states no medical or surgical changes since previsit or office visit. 

## 2017-05-14 ENCOUNTER — Telehealth: Payer: Self-pay | Admitting: *Deleted

## 2017-05-14 DIAGNOSIS — Z9011 Acquired absence of right breast and nipple: Secondary | ICD-10-CM | POA: Diagnosis not present

## 2017-05-14 DIAGNOSIS — N651 Disproportion of reconstructed breast: Secondary | ICD-10-CM | POA: Diagnosis not present

## 2017-05-14 NOTE — Telephone Encounter (Signed)
  Follow up Call-  Call back number 05/13/2017  Post procedure Call Back phone  # [phone]  Permission to leave phone message Yes  Some recent data might be hidden    No answer, left message to call if questions or concerns.

## 2017-05-14 NOTE — Telephone Encounter (Signed)
  Follow up Call-  Call back number 05/13/2017  Post procedure Call Back phone  # [phone]  Permission to leave phone message Yes  Some recent data might be hidden     Patient questions:  Do you have a fever, pain , or abdominal swelling? No. Pain Score  0 *  Have you tolerated food without any problems? Yes.    Have you been able to return to your normal activities? Yes.    Do you have any questions about your discharge instructions: Diet   No. Medications  No. Follow up visit  No.  Do you have questions or concerns about your Care? No.  Actions: * If pain score is 4 or above: No action needed, pain <4.

## 2017-05-16 ENCOUNTER — Telehealth: Payer: Self-pay | Admitting: Internal Medicine

## 2017-05-16 NOTE — Telephone Encounter (Signed)
Patient advised that should not be related to the procedure, she is also c/o pressure in her eyes and a bad HA.  No GI complaints or chest pain.  She is going to try Tylenol and call back if she has GI symptoms. EGD was 05/13/17.  She did not think her current symptoms were related to EGD just wanted to check with [place].

## 2017-05-16 NOTE — Telephone Encounter (Signed)
Agree 

## 2017-05-20 ENCOUNTER — Ambulatory Visit (AMBULATORY_SURGERY_CENTER): Payer: Self-pay

## 2017-05-20 VITALS — Ht 65.0 in | Wt 131.8 lb

## 2017-05-20 DIAGNOSIS — R1310 Dysphagia, unspecified: Secondary | ICD-10-CM

## 2017-05-20 NOTE — Progress Notes (Signed)
Denies allergies to eggs or soy products. Denies complication of anesthesia or sedation. Denies use of weight loss medication. Denies use of O2.   Emmi instructions given for colonoscopy.  

## 2017-05-21 ENCOUNTER — Telehealth: Payer: Self-pay | Admitting: Internal Medicine

## 2017-05-21 ENCOUNTER — Encounter: Payer: Self-pay | Admitting: Internal Medicine

## 2017-05-21 NOTE — Telephone Encounter (Signed)
Patient called stating that her medication for eosinophilic esophagitis is not covered by Medicare. Advised to contact the office tomorrow during regular hours to discuss with prescribing physician (Dr. [name]) or his nurse

## 2017-05-22 NOTE — Telephone Encounter (Signed)
Patient notified of Dr. Celesta Aver response.  She will try and go pick up drug.  It was $80.

## 2017-05-22 NOTE — Telephone Encounter (Signed)
Explain that this would be out of pocket cost - likely cheaper than the other treatments available even with insurance  She is coming for another dilation we can discuss further then.  Repeated dilation works but if we control it with medication (will not take this for more than 2 months usually) could end up being cheaper than repeated dilation  So I suggest she buy it if she can. If not will just stay on PPI and do dilations

## 2017-05-24 ENCOUNTER — Encounter (HOSPITAL_BASED_OUTPATIENT_CLINIC_OR_DEPARTMENT_OTHER): Payer: Self-pay | Admitting: *Deleted

## 2017-05-27 ENCOUNTER — Ambulatory Visit (AMBULATORY_SURGERY_CENTER): Payer: Medicare HMO | Admitting: Internal Medicine

## 2017-05-27 ENCOUNTER — Encounter: Payer: Self-pay | Admitting: Internal Medicine

## 2017-05-27 VITALS — BP 110/65 | HR 73 | Temp 96.6°F | Resp 22 | Ht 65.0 in | Wt 131.0 lb

## 2017-05-27 DIAGNOSIS — K219 Gastro-esophageal reflux disease without esophagitis: Secondary | ICD-10-CM | POA: Diagnosis not present

## 2017-05-27 DIAGNOSIS — K222 Esophageal obstruction: Secondary | ICD-10-CM | POA: Diagnosis not present

## 2017-05-27 DIAGNOSIS — R1310 Dysphagia, unspecified: Secondary | ICD-10-CM

## 2017-05-27 DIAGNOSIS — K200 Eosinophilic esophagitis: Secondary | ICD-10-CM | POA: Diagnosis not present

## 2017-05-27 DIAGNOSIS — Z0000 Encounter for general adult medical examination without abnormal findings: Secondary | ICD-10-CM

## 2017-05-27 MED ORDER — NONFORMULARY OR COMPOUNDED ITEM: 2 refills | Status: DC

## 2017-05-27 MED ORDER — SODIUM CHLORIDE 0.9 % IV SOLN: 500.0000 mL | INTRAVENOUS | Status: DC

## 2017-05-27 NOTE — Progress Notes (Signed)
Advised pt. Dr. [name] and Gaye Pollack CRNA that pt. States she had sip of water at 1330.

## 2017-05-27 NOTE — Progress Notes (Signed)
Called to room to assist during endoscopic procedure.  Patient ID and intended procedure confirmed with present staff. Received instructions for my participation in the procedure from the performing physician.  

## 2017-05-27 NOTE — Progress Notes (Signed)
A and O x3. Report to RN. Tolerated MAC anesthesia well.Gums unchanged after procedure.

## 2017-05-27 NOTE — Patient Instructions (Addendum)
I dilated the bottom of the esophagus again - a bit more. Stay on the budesonide - when finished can stop.  If you start to have swallowing problems again we can restart the budesonide which should be easier and cheaper than dilation but we have that also if needed.  I appreciate the opportunity to care for you. Gatha Mayer, MD, FACG YOU HAD AN ENDOSCOPIC PROCEDURE TODAY AT [facility]:   Refer to the procedure report that was given to you for any specific questions about what was found during the examination.  If the procedure report does not answer your questions, please call your gastroenterologist to clarify.  If you requested that your care partner not be given the details of your procedure findings, then the procedure report has been included in a sealed envelope for you to review at your convenience later.  YOU SHOULD EXPECT: Some feelings of bloating in the abdomen. Passage of more gas than usual.  Walking can help get rid of the air that was put into your GI tract during the procedure and reduce the bloating. If you had a lower endoscopy (such as a colonoscopy or flexible sigmoidoscopy) you may notice spotting of blood in your stool or on the toilet paper. If you underwent a bowel prep for your procedure, you may not have a normal bowel movement for a few days.  Please Note:  You might notice some irritation and congestion in your nose or some drainage.  This is from the oxygen used during your procedure.  There is no need for concern and it should clear up in a day or so.  SYMPTOMS TO REPORT IMMEDIATELY:   Following upper endoscopy (EGD)  Vomiting of blood or coffee ground material  New chest pain or pain under the shoulder blades  Painful or persistently difficult swallowing  New shortness of breath  Fever of 100F or higher  Black, tarry-looking stools  For urgent or emergent issues, a gastroenterologist can be reached at any hour by calling [phone]  [phone].   DIET: Please follow a Post-Dilation Diet (see handout given to you by your recovery nurse). Nothing by mouth for the first hour. Clear liquids for 1 hour starting at 3:40pm. Soft diet for the rest of today starting at 4:40pm, but then you may proceed to your regular diet as tolerated tomorrow.  Drink plenty of fluids but you should avoid alcoholic beverages for 24 hours.  MEDICATIONS: Continue present medications. Stay on your PPI. Finish Budesonide slurry and stop when this prescription is finished.  Follow up: Please call Dr. [name] back if you have continued difficulty swallowing and Dr. [name] will consider retreating you with the Budesonide and/or dilating again.  ACTIVITY:  You should plan to take it easy for the rest of today and you should NOT DRIVE or use heavy machinery until tomorrow (because of the sedation medicines used during the test).    FOLLOW UP: Our staff will call the number listed on your records the next business day following your procedure to check on you and address any questions or concerns that you may have regarding the information given to you following your procedure. If we do not reach you, we will leave a message.  However, if you are feeling well and you are not experiencing any problems, there is no need to return our call.  We will assume that you have returned to your regular daily activities without incident.  If any biopsies were taken  you will be contacted by phone or by letter within the next 1-3 weeks.  Please call us at [phone] if you have not heard about the biopsies in 3 weeks.   Thank you for allowing [place] to provide for your healthcare needs today.  SIGNATURES/CONFIDENTIALITY: You and/or your care partner have signed paperwork which will be entered into your electronic medical record.  These signatures attest to the fact that that the information above on your After Visit Summary has been reviewed and is understood.  Full  responsibility of the confidentiality of this discharge information lies with you and/or your care-partner.

## 2017-05-27 NOTE — Op Note (Signed)
[place] Patient Name: Anne [name] Procedure Date: 05/27/2017 2:23 PM MRN: [id number] Endoscopist: Gatha Mayer , MD Age: 76 Referring MD:  Date of Birth: 1941-02-19 Gender: Female Account #: [id number] Procedure:                Upper GI endoscopy Indications:              Dysphagia, Eosinophilic esophagitis, For therapy of                            eosinophilic esophagitis Medicines:                Propofol per Anesthesia, Monitored Anesthesia Care Procedure:                Pre-Anesthesia Assessment:                           - Prior to the procedure, a History and Physical                            was performed, and patient medications and                            allergies were reviewed. The patient's tolerance of                            previous anesthesia was also reviewed. The risks                            and benefits of the procedure and the sedation                            options and risks were discussed with the patient.                            All questions were answered, and informed consent                            was obtained. Prior Anticoagulants: The patient has                            taken no previous anticoagulant or antiplatelet                            agents. ASA Grade Assessment: II - A patient with                            mild systemic disease. After reviewing the risks                            and benefits, the patient was deemed in                            satisfactory condition to undergo the procedure.  After obtaining informed consent, the endoscope was                            passed under direct vision. Throughout the                            procedure, the patient's blood pressure, pulse, and                            oxygen saturations were monitored continuously. The                            Endoscope was introduced through the mouth, and                             advanced to the antrum of the stomach. The upper GI                            endoscopy was accomplished without difficulty. The                            patient tolerated the procedure well. Scope In: Scope Out: Findings:                 Mucosal changes including ringed esophagus were                            found in the entire esophagus. A TTS dilator was                            passed through the scope. Dilation with a 16-17-18                            mm balloon dilator was performed to 18 mm at a                            dominant ring at GE junction - moderate heme.                           Diffuse mildly erythematous mucosa without bleeding                            was found in the gastric antrum.                           The cardia and gastric fundus were normal on                            retroflexion. Complications:            No immediate complications. Estimated Blood Loss:     Estimated blood loss was minimal. Impression:               - Esophageal mucosal changes secondary to  eosinophilic esophagitis. Dilated.                           - Erythematous mucosa in the antrum.                           - No specimens collected. Recommendation:           - Patient has a contact number available for                            emergencies. The signs and symptoms of potential                            delayed complications were discussed with the                            patient. Return to normal activities tomorrow.                            Written discharge instructions were provided to the                            patient.                           - Clear liquids x 1 hour then soft foods rest of                            day. Start prior diet tomorrow.                           - Continue present medications.                           - stay on PPI                           finish budesonide slurry and stop when this Rx  done                           call me back if recurrent dysphagia and would                            retreat with budesonide and/or dilate again - she                            says swallowing was much better after dilation 2                            weeks ago Gatha Mayer, MD 05/27/2017 2:41:12 PM This report has been signed electronically.

## 2017-05-27 NOTE — Progress Notes (Signed)
Pt's states no medical or surgical changes since previsit or office visit. 

## 2017-05-27 NOTE — Progress Notes (Signed)
Patient touched ice to her lips - no sig water ingestion. Edward Qualia PA-S present for procedure

## 2017-05-28 ENCOUNTER — Ambulatory Visit: Payer: Self-pay | Admitting: Plastic Surgery

## 2017-05-28 ENCOUNTER — Telehealth: Payer: Self-pay | Admitting: *Deleted

## 2017-05-28 DIAGNOSIS — N651 Disproportion of reconstructed breast: Secondary | ICD-10-CM

## 2017-05-28 NOTE — Telephone Encounter (Signed)
  Follow up Call-  Call back number 05/27/2017 05/13/2017  Post procedure Call Back phone  # [phone]  Permission to leave phone message Yes Yes  Some recent data might be hidden     Patient questions:  Do you have a fever, pain , or abdominal swelling? No. Pain Score  0 *  Have you tolerated food without any problems? Yes.    Have you been able to return to your normal activities? Yes.    Do you have any questions about your discharge instructions: Diet   No. Medications  No. Follow up visit  No.  Do you have questions or concerns about your Care? No.  Actions: * If pain score is 4 or above: No action needed, pain <4.

## 2017-05-30 ENCOUNTER — Ambulatory Visit (HOSPITAL_BASED_OUTPATIENT_CLINIC_OR_DEPARTMENT_OTHER): Payer: Medicare HMO | Admitting: Anesthesiology

## 2017-05-30 ENCOUNTER — Encounter (HOSPITAL_BASED_OUTPATIENT_CLINIC_OR_DEPARTMENT_OTHER): Payer: Self-pay | Admitting: Anesthesiology

## 2017-05-30 ENCOUNTER — Ambulatory Visit (HOSPITAL_BASED_OUTPATIENT_CLINIC_OR_DEPARTMENT_OTHER)
Admission: RE | Admit: 2017-05-30 | Discharge: 2017-05-30 | Disposition: A | Discharge status: Home / Self Care | Payer: Medicare HMO | Source: Ambulatory Visit | Attending: Plastic Surgery | Admitting: Plastic Surgery

## 2017-05-30 ENCOUNTER — Encounter (HOSPITAL_BASED_OUTPATIENT_CLINIC_OR_DEPARTMENT_OTHER)
Admission: RE | Disposition: A | Discharge status: Home / Self Care | Payer: Self-pay | Source: Ambulatory Visit | Attending: Plastic Surgery

## 2017-05-30 DIAGNOSIS — Z8041 Family history of malignant neoplasm of ovary: Secondary | ICD-10-CM | POA: Diagnosis not present

## 2017-05-30 DIAGNOSIS — Z818 Family history of other mental and behavioral disorders: Secondary | ICD-10-CM | POA: Insufficient documentation

## 2017-05-30 DIAGNOSIS — K219 Gastro-esophageal reflux disease without esophagitis: Secondary | ICD-10-CM | POA: Diagnosis not present

## 2017-05-30 DIAGNOSIS — Z808 Family history of malignant neoplasm of other organs or systems: Secondary | ICD-10-CM | POA: Insufficient documentation

## 2017-05-30 DIAGNOSIS — Z886 Allergy status to analgesic agent status: Secondary | ICD-10-CM | POA: Insufficient documentation

## 2017-05-30 DIAGNOSIS — Z923 Personal history of irradiation: Secondary | ICD-10-CM | POA: Insufficient documentation

## 2017-05-30 DIAGNOSIS — E782 Mixed hyperlipidemia: Secondary | ICD-10-CM | POA: Diagnosis not present

## 2017-05-30 DIAGNOSIS — Z9011 Acquired absence of right breast and nipple: Secondary | ICD-10-CM | POA: Diagnosis not present

## 2017-05-30 DIAGNOSIS — D649 Anemia, unspecified: Secondary | ICD-10-CM | POA: Insufficient documentation

## 2017-05-30 DIAGNOSIS — J449 Chronic obstructive pulmonary disease, unspecified: Secondary | ICD-10-CM | POA: Diagnosis not present

## 2017-05-30 DIAGNOSIS — F419 Anxiety disorder, unspecified: Secondary | ICD-10-CM | POA: Insufficient documentation

## 2017-05-30 DIAGNOSIS — E785 Hyperlipidemia, unspecified: Secondary | ICD-10-CM | POA: Insufficient documentation

## 2017-05-30 DIAGNOSIS — Z9049 Acquired absence of other specified parts of digestive tract: Secondary | ICD-10-CM | POA: Diagnosis not present

## 2017-05-30 DIAGNOSIS — K222 Esophageal obstruction: Secondary | ICD-10-CM | POA: Insufficient documentation

## 2017-05-30 DIAGNOSIS — N651 Disproportion of reconstructed breast: Secondary | ICD-10-CM | POA: Diagnosis not present

## 2017-05-30 DIAGNOSIS — Z853 Personal history of malignant neoplasm of breast: Secondary | ICD-10-CM | POA: Insufficient documentation

## 2017-05-30 DIAGNOSIS — M542 Cervicalgia: Secondary | ICD-10-CM | POA: Diagnosis not present

## 2017-05-30 DIAGNOSIS — Z885 Allergy status to narcotic agent status: Secondary | ICD-10-CM | POA: Insufficient documentation

## 2017-05-30 DIAGNOSIS — Z803 Family history of malignant neoplasm of breast: Secondary | ICD-10-CM | POA: Insufficient documentation

## 2017-05-30 DIAGNOSIS — Z91010 Allergy to peanuts: Secondary | ICD-10-CM | POA: Insufficient documentation

## 2017-05-30 DIAGNOSIS — Z800 Family history of malignant neoplasm of digestive organs: Secondary | ICD-10-CM | POA: Diagnosis not present

## 2017-05-30 DIAGNOSIS — I2510 Atherosclerotic heart disease of native coronary artery without angina pectoris: Secondary | ICD-10-CM | POA: Diagnosis not present

## 2017-05-30 DIAGNOSIS — Z8249 Family history of ischemic heart disease and other diseases of the circulatory system: Secondary | ICD-10-CM | POA: Insufficient documentation

## 2017-05-30 DIAGNOSIS — F329 Major depressive disorder, single episode, unspecified: Secondary | ICD-10-CM | POA: Diagnosis not present

## 2017-05-30 DIAGNOSIS — M8580 Other specified disorders of bone density and structure, unspecified site: Secondary | ICD-10-CM | POA: Insufficient documentation

## 2017-05-30 DIAGNOSIS — M1990 Unspecified osteoarthritis, unspecified site: Secondary | ICD-10-CM | POA: Insufficient documentation

## 2017-05-30 DIAGNOSIS — Z9221 Personal history of antineoplastic chemotherapy: Secondary | ICD-10-CM | POA: Insufficient documentation

## 2017-05-30 DIAGNOSIS — I472 Ventricular tachycardia: Secondary | ICD-10-CM | POA: Insufficient documentation

## 2017-05-30 DIAGNOSIS — Z888 Allergy status to other drugs, medicaments and biological substances status: Secondary | ICD-10-CM | POA: Insufficient documentation

## 2017-05-30 DIAGNOSIS — Z91018 Allergy to other foods: Secondary | ICD-10-CM | POA: Insufficient documentation

## 2017-05-30 DIAGNOSIS — F1021 Alcohol dependence, in remission: Secondary | ICD-10-CM | POA: Diagnosis not present

## 2017-05-30 DIAGNOSIS — Z483 Aftercare following surgery for neoplasm: Secondary | ICD-10-CM | POA: Diagnosis not present

## 2017-05-30 DIAGNOSIS — Z87891 Personal history of nicotine dependence: Secondary | ICD-10-CM | POA: Insufficient documentation

## 2017-05-30 DIAGNOSIS — M549 Dorsalgia, unspecified: Secondary | ICD-10-CM | POA: Insufficient documentation

## 2017-05-30 DIAGNOSIS — K5900 Constipation, unspecified: Secondary | ICD-10-CM | POA: Diagnosis not present

## 2017-05-30 DIAGNOSIS — R1310 Dysphagia, unspecified: Secondary | ICD-10-CM | POA: Diagnosis not present

## 2017-05-30 DIAGNOSIS — I493 Ventricular premature depolarization: Secondary | ICD-10-CM | POA: Diagnosis not present

## 2017-05-30 DIAGNOSIS — Z8262 Family history of osteoporosis: Secondary | ICD-10-CM | POA: Insufficient documentation

## 2017-05-30 DIAGNOSIS — Z1501 Genetic susceptibility to malignant neoplasm of breast: Secondary | ICD-10-CM | POA: Diagnosis not present

## 2017-05-30 DIAGNOSIS — Z8261 Family history of arthritis: Secondary | ICD-10-CM | POA: Insufficient documentation

## 2017-05-30 DIAGNOSIS — Z823 Family history of stroke: Secondary | ICD-10-CM | POA: Insufficient documentation

## 2017-05-30 HISTORY — PX: BREAST REDUCTION WITH MASTOPEXY: SHX6465

## 2017-05-30 LAB — POCT HEMOGLOBIN-HEMACUE: HEMOGLOBIN: 12.3 g/dL (ref 12.0–15.0)

## 2017-05-30 SURGERY — Surgical Case
Anesthesia: *Unknown

## 2017-05-30 SURGERY — MAMMOPLASTY, REDUCTION, WITH MASTOPEXY
Anesthesia: General | Site: Breast | Laterality: Left

## 2017-05-30 MED ORDER — ONDANSETRON HCL 4 MG/2ML IJ SOLN
INTRAMUSCULAR | Status: AC
Start: 2017-05-30 — End: ?
  Filled 2017-05-30: qty 2

## 2017-05-30 MED ORDER — LACTATED RINGERS IV SOLN
INTRAVENOUS | Status: DC
  Administered 2017-05-30: 10:00:00 via INTRAVENOUS

## 2017-05-30 MED ORDER — PHENYLEPHRINE 40 MCG/ML (10ML) SYRINGE FOR IV PUSH (FOR BLOOD PRESSURE SUPPORT)
PREFILLED_SYRINGE | INTRAVENOUS | Status: AC
  Filled 2017-05-30: qty 10

## 2017-05-30 MED ORDER — SODIUM CHLORIDE 0.9% FLUSH: 3.0000 mL | Freq: Two times a day (BID) | INTRAVENOUS | Status: DC

## 2017-05-30 MED ORDER — ACETAMINOPHEN 325 MG PO TABS: 650.0000 mg | ORAL_TABLET | ORAL | Status: DC | PRN

## 2017-05-30 MED ORDER — DEXAMETHASONE SODIUM PHOSPHATE 10 MG/ML IJ SOLN
INTRAMUSCULAR | Status: AC
  Filled 2017-05-30: qty 1

## 2017-05-30 MED ORDER — LIDOCAINE HCL (CARDIAC) 20 MG/ML IV SOLN
INTRAVENOUS | Status: DC | PRN
  Administered 2017-05-30: 50 mg via INTRAVENOUS

## 2017-05-30 MED ORDER — ROCURONIUM BROMIDE 50 MG/5ML IV SOLN
INTRAVENOUS | Status: AC
  Filled 2017-05-30: qty 1

## 2017-05-30 MED ORDER — PHENYLEPHRINE HCL 10 MG/ML IJ SOLN
INTRAVENOUS | Status: DC | PRN
  Administered 2017-05-30: 40 ug/min via INTRAVENOUS

## 2017-05-30 MED ORDER — SUCCINYLCHOLINE CHLORIDE 200 MG/10ML IV SOSY
PREFILLED_SYRINGE | INTRAVENOUS | Status: AC
  Filled 2017-05-30: qty 10

## 2017-05-30 MED ORDER — EPHEDRINE SULFATE 50 MG/ML IJ SOLN
INTRAMUSCULAR | Status: DC | PRN
  Administered 2017-05-30: 10 mg via INTRAVENOUS
  Administered 2017-05-30: 15 mg via INTRAVENOUS

## 2017-05-30 MED ORDER — SUGAMMADEX SODIUM 200 MG/2ML IV SOLN
INTRAVENOUS | Status: AC
  Filled 2017-05-30: qty 2

## 2017-05-30 MED ORDER — MIDAZOLAM HCL 2 MG/2ML IJ SOLN
INTRAMUSCULAR | Status: AC
  Filled 2017-05-30: qty 2

## 2017-05-30 MED ORDER — PROPOFOL 10 MG/ML IV BOLUS
INTRAVENOUS | Status: DC | PRN
  Administered 2017-05-30: 150 mg via INTRAVENOUS

## 2017-05-30 MED ORDER — PHENYLEPHRINE HCL 10 MG/ML IJ SOLN
INTRAMUSCULAR | Status: DC | PRN
  Administered 2017-05-30: 80 ug via INTRAVENOUS

## 2017-05-30 MED ORDER — SUGAMMADEX SODIUM 200 MG/2ML IV SOLN
INTRAVENOUS | Status: DC | PRN
  Administered 2017-05-30: 120 mg via INTRAVENOUS

## 2017-05-30 MED ORDER — EPHEDRINE 5 MG/ML INJ
INTRAVENOUS | Status: AC
  Filled 2017-05-30: qty 10

## 2017-05-30 MED ORDER — OXYCODONE HCL 5 MG PO TABS
5.0000 mg | ORAL_TABLET | Freq: Once | ORAL | Status: AC
  Administered 2017-05-30: 5 mg via ORAL

## 2017-05-30 MED ORDER — SCOPOLAMINE 1 MG/3DAYS TD PT72: 1.0000 | MEDICATED_PATCH | Freq: Once | TRANSDERMAL | Status: DC | PRN

## 2017-05-30 MED ORDER — FENTANYL CITRATE (PF) 100 MCG/2ML IJ SOLN
INTRAMUSCULAR | Status: AC
  Filled 2017-05-30: qty 2

## 2017-05-30 MED ORDER — ACETAMINOPHEN 650 MG RE SUPP: 650.0000 mg | RECTAL | Status: DC | PRN

## 2017-05-30 MED ORDER — ROCURONIUM BROMIDE 100 MG/10ML IV SOLN
INTRAVENOUS | Status: DC | PRN
  Administered 2017-05-30: 40 mg via INTRAVENOUS

## 2017-05-30 MED ORDER — CEFAZOLIN SODIUM-DEXTROSE 2-4 GM/100ML-% IV SOLN
2.0000 g | INTRAVENOUS | Status: AC
  Administered 2017-05-30: 2 g via INTRAVENOUS

## 2017-05-30 MED ORDER — OXYCODONE HCL 5 MG PO TABS
ORAL_TABLET | ORAL | Status: AC
  Filled 2017-05-30: qty 1

## 2017-05-30 MED ORDER — LIDOCAINE HCL (CARDIAC) 20 MG/ML IV SOLN
INTRAVENOUS | Status: AC
  Filled 2017-05-30: qty 5

## 2017-05-30 MED ORDER — SODIUM BICARBONATE 4 % IV SOLN
INTRAVENOUS | Status: DC | PRN
  Administered 2017-05-30: 100 mL via INTRAMUSCULAR

## 2017-05-30 MED ORDER — BUPIVACAINE-EPINEPHRINE 0.25% -1:200000 IJ SOLN
INTRAMUSCULAR | Status: DC | PRN
  Administered 2017-05-30: 8 mL

## 2017-05-30 MED ORDER — FENTANYL CITRATE (PF) 100 MCG/2ML IJ SOLN
25.0000 ug | INTRAMUSCULAR | Status: DC | PRN
  Administered 2017-05-30 (×3): 25 ug via INTRAVENOUS

## 2017-05-30 MED ORDER — SODIUM CHLORIDE 0.9% FLUSH: 3.0000 mL | INTRAVENOUS | Status: DC | PRN

## 2017-05-30 MED ORDER — FENTANYL CITRATE (PF) 100 MCG/2ML IJ SOLN
50.0000 ug | INTRAMUSCULAR | Status: DC | PRN
  Administered 2017-05-30: 50 ug via INTRAVENOUS

## 2017-05-30 MED ORDER — MIDAZOLAM HCL 2 MG/2ML IJ SOLN
1.0000 mg | INTRAMUSCULAR | Status: DC | PRN
  Administered 2017-05-30: 1 mg via INTRAVENOUS

## 2017-05-30 MED ORDER — SODIUM CHLORIDE 0.9 % IV SOLN: 250.0000 mL | INTRAVENOUS | Status: DC | PRN

## 2017-05-30 MED ORDER — DEXAMETHASONE SODIUM PHOSPHATE 4 MG/ML IJ SOLN
INTRAMUSCULAR | Status: DC | PRN
  Administered 2017-05-30: 10 mg via INTRAVENOUS

## 2017-05-30 SURGICAL SUPPLY — 58 items
ADH SKN CLS APL DERMABOND .7 (GAUZE/BANDAGES/DRESSINGS) ×1
BAG DECANTER FOR FLEXI CONT (MISCELLANEOUS) IMPLANT
BINDER BREAST LRG (GAUZE/BANDAGES/DRESSINGS) ×2 IMPLANT
BINDER BREAST MEDIUM (GAUZE/BANDAGES/DRESSINGS) ×2 IMPLANT
BINDER BREAST XLRG (GAUZE/BANDAGES/DRESSINGS) IMPLANT
BINDER BREAST XXLRG (GAUZE/BANDAGES/DRESSINGS) IMPLANT
BLADE HEX COATED 2.75 (ELECTRODE) ×3 IMPLANT
BLADE SURG 10 STRL SS (BLADE) ×3 IMPLANT
BLADE SURG 15 STRL LF DISP TIS (BLADE) ×1 IMPLANT
BLADE SURG 15 STRL SS (BLADE) ×3
BNDG GAUZE ELAST 4 BULKY (GAUZE/BANDAGES/DRESSINGS) IMPLANT
CANISTER SUCT 1200ML W/VALVE (MISCELLANEOUS) ×3 IMPLANT
CHLORAPREP W/TINT 26ML (MISCELLANEOUS) ×3 IMPLANT
CLOSURE WOUND 1/2 X4 (GAUZE/BANDAGES/DRESSINGS)
COVER BACK TABLE 60X90IN (DRAPES) ×3 IMPLANT
COVER MAYO STAND STRL (DRAPES) ×3 IMPLANT
DECANTER SPIKE VIAL GLASS SM (MISCELLANEOUS) IMPLANT
DERMABOND ADVANCED (GAUZE/BANDAGES/DRESSINGS) ×2
DERMABOND ADVANCED .7 DNX12 (GAUZE/BANDAGES/DRESSINGS) IMPLANT
DRAIN CHANNEL 19F RND (DRAIN) IMPLANT
DRAPE LAPAROSCOPIC ABDOMINAL (DRAPES) ×3 IMPLANT
DRSG PAD ABDOMINAL 8X10 ST (GAUZE/BANDAGES/DRESSINGS) ×6 IMPLANT
ELECT BLADE 4.0 EZ CLEAN MEGAD (MISCELLANEOUS) ×3
ELECT REM PT RETURN 9FT ADLT (ELECTROSURGICAL) ×3
ELECTRODE BLDE 4.0 EZ CLN MEGD (MISCELLANEOUS) IMPLANT
ELECTRODE REM PT RTRN 9FT ADLT (ELECTROSURGICAL) ×1 IMPLANT
EVACUATOR SILICONE 100CC (DRAIN) IMPLANT
GAUZE SPONGE 4X4 12PLY STRL (GAUZE/BANDAGES/DRESSINGS) IMPLANT
GAUZE SPONGE 4X4 12PLY STRL LF (GAUZE/BANDAGES/DRESSINGS) IMPLANT
GLOVE BIO SURGEON STRL SZ 6.5 (GLOVE) ×2 IMPLANT
GLOVE BIO SURGEONS STRL SZ 6.5 (GLOVE) ×1
GOWN STRL REUS W/ TWL LRG LVL3 (GOWN DISPOSABLE) ×2 IMPLANT
GOWN STRL REUS W/TWL LRG LVL3 (GOWN DISPOSABLE) ×6
NDL HYPO 25X1 1.5 SAFETY (NEEDLE) ×1 IMPLANT
NEEDLE HYPO 25X1 1.5 SAFETY (NEEDLE) ×3 IMPLANT
NS IRRIG 1000ML POUR BTL (IV SOLUTION) IMPLANT
PACK BASIN DAY SURGERY FS (CUSTOM PROCEDURE TRAY) ×3 IMPLANT
PENCIL BUTTON HOLSTER BLD 10FT (ELECTRODE) ×3 IMPLANT
PIN SAFETY STERILE (MISCELLANEOUS) IMPLANT
SLEEVE SCD COMPRESS KNEE MED (MISCELLANEOUS) ×3 IMPLANT
SPONGE LAP 18X18 X RAY DECT (DISPOSABLE) ×6 IMPLANT
STRIP CLOSURE SKIN 1/2X4 (GAUZE/BANDAGES/DRESSINGS) ×1 IMPLANT
STRIP SUTURE WOUND CLOSURE 1/2 (SUTURE) ×3 IMPLANT
SUT MNCRL AB 4-0 PS2 18 (SUTURE) ×3 IMPLANT
SUT MON AB 3-0 SH 27 (SUTURE) ×3
SUT MON AB 3-0 SH27 (SUTURE) ×1 IMPLANT
SUT MON AB 5-0 PS2 18 (SUTURE) ×3 IMPLANT
SUT SILK 3 0 PS 1 (SUTURE) IMPLANT
SUT VIC AB 3-0 SH 27 (SUTURE) ×3
SUT VIC AB 3-0 SH 27X BRD (SUTURE) ×1 IMPLANT
SYR BULB IRRIGATION 50ML (SYRINGE) ×3 IMPLANT
SYR CONTROL 10ML LL (SYRINGE) ×3 IMPLANT
TAPE MEASURE VINYL STERILE (MISCELLANEOUS) ×3 IMPLANT
TOWEL OR 17X24 6PK STRL BLUE (TOWEL DISPOSABLE) ×6 IMPLANT
TUBE CONNECTING 20'X1/4 (TUBING) ×1
TUBE CONNECTING 20X1/4 (TUBING) ×2 IMPLANT
UNDERPAD 30X30 (UNDERPADS AND DIAPERS) ×4 IMPLANT
YANKAUER SUCT BULB TIP NO VENT (SUCTIONS) ×3 IMPLANT

## 2017-05-30 NOTE — H&P (Signed)
Anne [name] is an 76 y.o. female.   Chief Complaint: right breast cancer HPI: The patient is a 76 y.o. yrs old wf here for breast reconstruction.  She was diagnosed and treated for RIGHT breast lobular carcinoma in 2001.  She had a lumpectomy and 4 nodes excised.  She was then radiated.  She had another cancer on the RIGHT and underwent a mastectomy in 2008.  She had a skin recurrence with was treated with chemo.  She had a saline implant reconstruction on the right and left mastopexy on the left in 2009. She is now interested in a revision for better symmetry.  She does not like the placement of the right implant and how high it is located.  The biggest concern is the asymmetry and not being able to wear a bra that fits both breasts.  No masses palpated and no concerning areas.  History:  She was pT1cpN1 (2 /4 nodes were positive).  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  She was treated with CMF and 5 years of tamoxifen. In January 2008, she was diagnosed with invasive lobular carcinoma, ER/PR positive, HER 2 positive. RIGHT mastectomy 05/2007. She had some difficulty with healing.  In 2010 she had skin recurrence and was treated with Xeloda followed by Arimidex which was stopped due to bone density scan results. She is osteopenic but has not had any recurrence since per PET.  Iimplant card but no numbers on it.  She is 5 feet 6 inches tall, weighs 136 pounds.  Preop bra = 34 D and postop = 34 D.  Past Medical History:  Diagnosis Date  . ALKALINE PHOSPHATASE, ELEVATED 03/15/2009  . Allergic state 06/10/2012  . Anemia   . Anxiety   . Anxiety and depression 04/28/2011  . Arthritis   . Atypical chest pain 11/30/2011  . 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 Baker's cyst of knee 05/22/2011  . Cancer ([place]) 01,  08   XRT/chemo 01-02/ lobular invasive ca  . Carotid artery disease ([place])    a. Carotid duplex 03/2014:  stable 1-39% BICA, f/u due 03/2016.  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    [name] COPD (chronic obstructive pulmonary disease) ([place])   . Dermatitis 11/23/2012  . Dysphagia   . EE (eosinophilic esophagitis)   . Elevated sed rate 08/02/2013  . Esophageal ring   . ESOPHAGEAL STRICTURE 03/29/2009  . Fall 11/23/2012  . Family history of breast cancer   . Family history of colon cancer   . Family history of ovarian cancer   . Family history of pancreatic cancer   . Folliculitis of nose 02/27/4080  . GERD (gastroesophageal reflux disease) 09/29/2009   improved s/p cholecystectomy and esophagus dilatation  . Hematuria   . History of chicken pox   . History of measles   . History of shingles    2 episodes  . Hx of echocardiogram    a. Echo 01/2013: mild LVH, EF 55-60%, normal wall motion, Gr 1 diast dysfn  . Hyperlipidemia   . Knee pain, bilateral 07/23/2011  . Medicare annual wellness visit, subsequent 06/19/2015  . Mixed hyperlipidemia 10/17/2010   Qualifier: Diagnosis of  By: Mack Guise    . Orthostasis   . Osteopenia 03/14/2011  . Personal history of chemotherapy 2001  . Personal history of radiation therapy 2001  rt breast  . 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 Pneumonia 2-10   pleurisy  . PVC's (premature ventricular contractions)    a. Event monitor 01/2013: NSR, extensive PVCs.  . Radial neck fracture 10/2011   minimally displaced  . Sinusitis, acute 12/04/2015  . Trauma 10/18/2011  . URI (upper respiratory infection) 09/02/2012  . Urinary frequency 12/12/2014  . Urinary incontinence 03/19/2012  . Vaginitis 05/22/2011    Past Surgical History:  Procedure Laterality Date  . ANTERIOR CRUCIATE LIGAMENT REPAIR  08, 09, 10  . AUGMENTATION MAMMAPLASTY Right 05/27/2007  . BREAST BIOPSY Right 01/02/2007   wire loc  . BREAST BIOPSY  12/26/2006  . BREAST LUMPECTOMY Right 2001  . BREAST RECONSTRUCTION   2008, 2009, 2010  . CHOLECYSTECTOMY  2010  . COLONOSCOPY  09/05/10   cecal avm's  . DENTAL SURGERY  05/2016   4 dental implants by Dr. Loyal Gambler.  [name] ERCP  2010    CBD stone extraction   . ESOPHAGOGASTRODUODENOSCOPY  01/08/2012   Procedure: ESOPHAGOGASTRODUODENOSCOPY (EGD);  Surgeon: Gatha Mayer, MD;  Location: [facility];  Service: Endoscopy;  Laterality: N/A;  . ESOPHAGOGASTRODUODENOSCOPY (EGD) WITH ESOPHAGEAL DILATION  2010, 2012  . LAPAROSCOPIC APPENDECTOMY N/A 08/04/2016   Procedure: APPENDECTOMY LAPAROSCOPIC;  Surgeon: Michael Boston, MD;  Location: [facility];  Service: General;  Laterality: N/A;  . MASTECTOMY MODIFIED RADICAL Right 05/27/2007   , Mastectomy modified radical (08), breast reconstruction, CA lesions excised lateral abd wall 2010  . SAVORY DILATION  01/08/2012   Procedure: SAVORY DILATION;  Surgeon: Gatha Mayer, MD;  Location: [facility];  Service: Endoscopy;  Laterality: N/A;  need xray    Family History  Problem Relation Age of Onset  . Heart disease Father   . Lung cancer Father        smoker  . Cirrhosis Sister        Primary Biliary  . Stroke Maternal Grandmother   . Alcohol abuse Maternal Grandfather   . Heart disease Paternal Grandfather   . Anxiety disorder Sister   . Osteoporosis Sister   . Arthritis Sister        Rheumatoid  . Osteoporosis Sister   . Skin cancer Sister        multiple skin cancers, over 24 excisions.  . Other Mother        tic [place]  . Breast cancer Maternal Aunt        dx in her 12s  . Breast cancer Paternal Aunt        dx in her 42s  . Pancreatic cancer Maternal Uncle        dx in his 45s; smoker  . Breast cancer Maternal Aunt        dx in her 18s  . Breast cancer Maternal Aunt        possible breast cancer dx and died in her 57s  . Ovarian cancer Maternal Aunt 29  . Pancreatic cancer Maternal Aunt        dx in her 41s  . Stomach cancer Maternal Uncle   . Colon cancer Maternal Uncle   . Lung cancer Paternal Aunt    . Breast cancer Cousin        paternal first cousin  . Breast cancer Cousin        maternal first cousin  . Anesthesia problems Neg Hx   . Hypotension Neg Hx   . Malignant hyperthermia Neg Hx   . Pseudochol deficiency Neg Hx  Social History:  reports that she quit smoking about 10 years ago. Her smoking use included Cigarettes. She has a 50.00 pack-year smoking history. She has never used smokeless tobacco. She reports that she drinks alcohol. She reports that she does not use drugs.  Allergies:  Allergies  Allergen Reactions  . Codeine Other (See Comments)    "flu like symptoms"  . Morphine Other (See Comments)    "flu like symptoms"  . Peanut-Containing Drug Products Hives  . Sorbitol Other (See Comments)    GI Issues  . Tomato Diarrhea  . Zofran Other (See Comments)    headache  . Advil [Ibuprofen] Other (See Comments)    Irritates throat.  . Diphenhydramine Hcl Other (See Comments)    "nervous and upset"    No prescriptions prior to admission.    No results found for this or any previous visit (from the past 48 hour(s)). No results found.  Review of Systems  Constitutional: Negative.   HENT: Negative.   Eyes: Negative.   Respiratory: Negative.   Cardiovascular: Negative.   Genitourinary: Negative.   Musculoskeletal: Negative.   Skin: Negative.   Neurological: Negative.   Psychiatric/Behavioral: Negative.     Height 5\' 5"  (1.651 m), weight 59.4 kg (131 lb). Physical Exam  Constitutional: She is oriented to person, place, and time. She appears well-developed and well-nourished.  HENT:  Head: Normocephalic and atraumatic.  Eyes: Pupils are equal, round, and reactive to light. EOM are normal.  Cardiovascular: Normal rate.   Respiratory: Effort normal.  GI: Soft.  Musculoskeletal: She exhibits no edema.  Neurological: She is alert and oriented to person, place, and time.  Skin: Skin is warm. No rash noted. No erythema. No pallor.  Psychiatric: She has a  normal mood and affect. Her behavior is normal. Judgment and thought content normal.     Assessment/Plan Plan for left breast reduction / mastopexy for symmetry.  Wallace Going, DO 05/30/2017, 8:13 AM

## 2017-05-30 NOTE — Anesthesia Procedure Notes (Signed)
Procedure Name: Intubation Date/Time: 05/30/2017 10:19 AM Performed by: Melynda Ripple D Pre-anesthesia Checklist: Patient identified, Emergency Drugs available, Suction available and Patient being monitored Patient Re-evaluated:Patient Re-evaluated prior to induction Oxygen Delivery Method: Circle system utilized Preoxygenation: Pre-oxygenation with 100% oxygen Induction Type: IV induction Ventilation: Mask ventilation without difficulty Laryngoscope Size: Mac and 3 Grade View: Grade I Tube type: Oral Number of attempts: 1 Airway Equipment and Method: Stylet and Oral airway Placement Confirmation: ETT inserted through vocal cords under direct vision,  positive ETCO2 and breath sounds checked- equal and bilateral Tube secured with: Tape Dental Injury: Teeth and Oropharynx as per pre-operative assessment

## 2017-05-30 NOTE — Transfer of Care (Signed)
Immediate Anesthesia Transfer of Care Note  Patient: Anne [name]  Procedure(s) Performed: Procedure(s): LEFT BREAST REDUCTION FOR SYMTERY WITH MASTOPEXY (Left)  Patient Location: PACU  Anesthesia Type:General  Level of Consciousness: awake and drowsy  Airway & Oxygen Therapy: Patient Spontanous Breathing and Patient connected to face mask oxygen  Post-op Assessment: Report given to RN and Post -op Vital signs reviewed and stable  Post vital signs: Reviewed and stable  Last Vitals:  Vitals:   05/30/17 0945  BP: (!) 87/55  Pulse: 81  Resp: 20  Temp: 36.7 C    Last Pain:  Vitals:   05/30/17 0945  TempSrc: Oral         Complications: No apparent anesthesia complications

## 2017-05-30 NOTE — Interval H&P Note (Signed)
History and Physical Interval Note:  05/30/2017 10:02 AM  Anne [name]  has presented today for surgery, with the diagnosis of breast asymetry following reconstructive surgery  The various methods of treatment have been discussed with the patient and family. After consideration of risks, benefits and other options for treatment, the patient has consented to  Procedure(s): LEFT BREAST REDUCTION FOR SYMTERY WITH MASTOPEXY (Left) as a surgical intervention .  The patient's history has been reviewed, patient examined, no change in status, stable for surgery.  I have reviewed the patient's chart and labs.  Questions were answered to the patient's satisfaction.     Wallace Going

## 2017-05-30 NOTE — Op Note (Signed)
Breast Reduction Op note:    DATE OF PROCEDURE: 05/30/2017  LOCATION: [facility]  SURGEON: Lyndee Leo Sanger Dillingham, DO  ASSISTANT: Shawn [place], PA  PREOPERATIVE DIAGNOSIS 1. Right breast cancer. 2. Left Breast asymmetry after right breast reconstruction. 2. Neck Pain / Back Pain due to asymemtry.  POSTOPERATIVE DIAGNOSIS 1. Right breast cancer. 2. Left Breast asymmetry after right breast reconstruction. 2. Neck Pain / Back Pain due to asymemtry.  PROCEDURES 1. Left breast reduction / mastopexy,  537S  COMPLICATIONS: None.  DRAINS: none  INDICATIONS FOR PROCEDURE [name] is a 76 y.o. year-old female born on [birth year],with a history of symptomatic macromastia with concominant back pain, neck pain, shoulder grooving from her bra due to the asymmetry.  She had a right breast mastectomy with reconstruction..   MRN: [id number]  CONSENT Informed consent was obtained directly from the patient. The risks, benefits and alternatives were fully discussed. Specific risks including but not limited to bleeding, infection, hematoma, seroma, scarring, pain, nipple necrosis, asymmetry, poor cosmetic results, and need for further surgery were discussed. The patient had ample opportunity to have her questions answered to her satisfaction.  It was expressed in detail that the breasts will not look exactly alike due to the reconstructed side. The left side also has limites due to the previous surgery and location of the pedicle.  DESCRIPTION OF PROCEDURE  Patient was brought into the operating room and placed in a supine position.  SCDs were placed and appropriate padding was performed.  Antibiotics were given. The patient underwent general anesthesia and the chest was prepped and draped in a sterile fashion.  A timeout was performed and all information was confirmed to be correct.   Left side: Preoperative markings were confirmed.  Incision lines were injected with  1% Xylocaine with epinephrine.  Tumescent was placed laterally. After waiting for vasoconstriction, the marked lines were incised.  A Wise-pattern skinl breast reduction was performed by de-epithelializing the inferior pedicle, using bovie to create the pedicle keeping in mind the previous surgery.  Care was taken to not undermine the breast pedicle. The liposuction was performed at the lateral aspect of the breast. Hemostasis was achieved.  The nipple was gently rotated into position and the soft tissue was closed with 4-0 Monocryl.  The patient was sat upright and size and shape symmetry was confirmed.  The deep tissues were approximated with 3-0 monocryl sutures and the skin was closed with deep dermal 4-0 Monocryl and 5-0 Monocryl for the subcuticular sutures.  Dermabond and steri strips were applied.  A breast binder and ABDs were placed.  The nipple and skin flaps had good capillary refill at the end of the procedure.  The patient tolerated the procedure well. The patient was allowed to wake from anesthesia and taken to the recovery room in satisfactory condition

## 2017-05-30 NOTE — Anesthesia Preprocedure Evaluation (Addendum)
Anesthesia Evaluation  Patient identified by MRN, date of birth, ID band Patient awake    Reviewed: Allergy & Precautions, NPO status , Patient's Chart, lab work & pertinent test results  History of Anesthesia Complications (+) Emergence Delirium  Airway Mallampati: II  TM Distance: >3 FB Neck ROM: Full    Dental  (+) Dental Advisory Given, Lower Dentures, Upper Dentures   Pulmonary COPD, former smoker,  XRT/chemo 01-02/ lobular invasive cancer   Pulmonary exam normal breath sounds clear to auscultation       Cardiovascular + Peripheral Vascular Disease  Normal cardiovascular exam+ dysrhythmias Supra Ventricular Tachycardia  Rhythm:Regular Rate:Normal     Neuro/Psych PSYCHIATRIC DISORDERS Anxiety negative neurological ROS     GI/Hepatic Neg liver ROS, GERD  Medicated,H/o EtOH abuse  s/p cholecystectomy and esophagus dilatation   Endo/Other  negative endocrine ROS  Renal/GU negative Renal ROS     Musculoskeletal  (+) Arthritis ,   Abdominal   Peds  Hematology negative hematology ROS (+) anemia ,   Anesthesia Other Findings Day of surgery medications reviewed with the patient.  Reproductive/Obstetrics                           Anesthesia Physical Anesthesia Plan  ASA: III  Anesthesia Plan: General   Post-op Pain Management:    Induction: Intravenous  PONV Risk Score and Plan: 3 and Dexamethasone and Treatment may vary due to age or medical condition  Airway Management Planned: Oral ETT  Additional Equipment:   Intra-op Plan:   Post-operative Plan: Extubation in OR  Informed Consent: I have reviewed the patients History and Physical, chart, labs and discussed the procedure including the risks, benefits and alternatives for the proposed anesthesia with the patient or authorized representative who has indicated his/her understanding and acceptance.   Dental advisory  given  Plan Discussed with: CRNA  Anesthesia Plan Comments: (Risks/benefits of general anesthesia discussed with patient including risk of damage to teeth, lips, gum, and tongue, nausea/vomiting, allergic reactions to medications, and the possibility of heart attack, stroke and death.  All patient questions answered.  Patient wishes to proceed.)       Anesthesia Quick Evaluation

## 2017-05-30 NOTE — Discharge Instructions (Signed)
May shower tomorrow Continue binder or sports bra No heavy lifting   Post Anesthesia Home Care Instructions  Activity: Get plenty of rest for the remainder of the day. A responsible individual must stay with you for 24 hours following the procedure.  For the next 24 hours, DO NOT: -Drive a car -Paediatric nurse -Drink alcoholic beverages -Take any medication unless instructed by your physician -Make any legal decisions or sign important papers.  Meals: Start with liquid foods such as gelatin or soup. Progress to regular foods as tolerated. Avoid greasy, spicy, heavy foods. If nausea and/or vomiting occur, drink only clear liquids until the nausea and/or vomiting subsides. Call your physician if vomiting continues.  Special Instructions/Symptoms: Your throat may feel dry or sore from the anesthesia or the breathing tube placed in your throat during surgery. If this causes discomfort, gargle with warm salt water. The discomfort should disappear within 24 hours.  If you had a scopolamine patch placed behind your ear for the management of post- operative nausea and/or vomiting:  1. The medication in the patch is effective for 72 hours, after which it should be removed.  Wrap patch in a tissue and discard in the trash. Wash hands thoroughly with soap and water. 2. You may remove the patch earlier than 72 hours if you experience unpleasant side effects which may include dry mouth, dizziness or visual disturbances. 3. Avoid touching the patch. Wash your hands with soap and water after contact with the patch.

## 2017-05-30 NOTE — Anesthesia Postprocedure Evaluation (Signed)
Anesthesia Post Note  Patient: Anne [name]  Procedure(s) Performed: Procedure(s) (LRB): LEFT BREAST REDUCTION FOR SYMTERY WITH MASTOPEXY (Left)     Patient location during evaluation: PACU Anesthesia Type: General Level of consciousness: awake and alert Pain management: pain level controlled Vital Signs Assessment: post-procedure vital signs reviewed and stable Respiratory status: spontaneous breathing, nonlabored ventilation, respiratory function stable and patient connected to nasal cannula oxygen Cardiovascular status: blood pressure returned to baseline and stable Postop Assessment: no signs of nausea or vomiting Anesthetic complications: no    Last Vitals:  Vitals:   05/30/17 1230 05/30/17 1400  BP: (!) 94/55 (!) 91/54  Pulse: 83 85  Resp: 15 15  Temp:  (!) 36.4 C    Last Pain:  Vitals:   05/30/17 1215  TempSrc:   PainSc: [place]

## 2017-05-31 ENCOUNTER — Encounter (HOSPITAL_BASED_OUTPATIENT_CLINIC_OR_DEPARTMENT_OTHER): Payer: Self-pay | Admitting: Plastic Surgery

## 2017-06-06 ENCOUNTER — Encounter: Payer: Self-pay | Admitting: Family Medicine

## 2017-06-12 ENCOUNTER — Encounter: Payer: Medicare HMO | Admitting: Internal Medicine

## 2017-06-13 ENCOUNTER — Telehealth: Payer: Self-pay | Admitting: Family Medicine

## 2017-06-13 NOTE — Telephone Encounter (Signed)
Last colonoscopy was 09/05/2010 She had called GI and found out the info she needed. Next one is due on 08/2020.

## 2017-06-13 NOTE — Telephone Encounter (Signed)
Relation to IF:OYDX Call back number:[phone]   Reason for call:  Patient states she received a friendly auto mated reminder regarding scheduling her colonoscopy. Patient would like to know when she had her last colonoscopy and would like to know when she's due, please advise and leave detail message.

## 2017-06-16 ENCOUNTER — Encounter: Payer: Self-pay | Admitting: Family Medicine

## 2017-06-17 ENCOUNTER — Encounter: Payer: Self-pay | Admitting: Family Medicine

## 2017-06-19 ENCOUNTER — Other Ambulatory Visit: Payer: Self-pay | Admitting: Family Medicine

## 2017-06-19 ENCOUNTER — Encounter: Payer: Self-pay | Admitting: Family Medicine

## 2017-06-22 ENCOUNTER — Telehealth: Payer: Self-pay

## 2017-06-22 NOTE — Telephone Encounter (Signed)
Spoke with patient and she is aware of her new appt on 10/15 due to bmdc  [facility]

## 2017-07-01 NOTE — Telephone Encounter (Signed)
Caller name: Pammie Chirino Relationship to patient: spouse Can be reached: [phone] or [phone]  Reason for call: Pt has been using benefiber and metamucil as advised. After 5 days pt has had a bowel movement but having diarrhea x 6 hours. Pt requesting call.

## 2017-07-17 ENCOUNTER — Telehealth: Payer: Self-pay | Admitting: Internal Medicine

## 2017-07-17 NOTE — Telephone Encounter (Signed)
OK great I suggest she see me at next available f/u

## 2017-07-17 NOTE — Telephone Encounter (Signed)
Patient reports that she has finished the budesonide and is doing well.

## 2017-07-17 NOTE — Telephone Encounter (Signed)
Patient has already been scheduled for October

## 2017-07-21 ENCOUNTER — Encounter: Payer: Self-pay | Admitting: Family Medicine

## 2017-07-24 ENCOUNTER — Encounter: Payer: Self-pay | Admitting: Family Medicine

## 2017-07-24 ENCOUNTER — Encounter: Payer: Self-pay | Admitting: Internal Medicine

## 2017-07-25 ENCOUNTER — Other Ambulatory Visit: Payer: Self-pay

## 2017-07-25 ENCOUNTER — Other Ambulatory Visit: Payer: Self-pay | Admitting: Family Medicine

## 2017-07-25 ENCOUNTER — Encounter: Payer: Self-pay | Admitting: Internal Medicine

## 2017-07-25 ENCOUNTER — Encounter: Payer: Self-pay | Admitting: Family Medicine

## 2017-07-25 MED ORDER — TIZANIDINE HCL 2 MG PO TABS: 2.0000 mg | ORAL_TABLET | Freq: Every evening | ORAL | 0 refills | Status: DC | PRN

## 2017-07-25 NOTE — Telephone Encounter (Signed)
Discussed with pharm that this is an acute Rx 90d not appropriate/thx dmf

## 2017-07-27 ENCOUNTER — Encounter: Payer: Self-pay | Admitting: Family Medicine

## 2017-08-05 ENCOUNTER — Ambulatory Visit (INDEPENDENT_AMBULATORY_CARE_PROVIDER_SITE_OTHER): Payer: Medicare HMO | Admitting: Family Medicine

## 2017-08-05 ENCOUNTER — Encounter: Payer: Self-pay | Admitting: Family Medicine

## 2017-08-05 ENCOUNTER — Ambulatory Visit (HOSPITAL_BASED_OUTPATIENT_CLINIC_OR_DEPARTMENT_OTHER)
Admission: RE | Admit: 2017-08-05 | Discharge: 2017-08-05 | Disposition: A | Discharge status: Home / Self Care | Payer: Medicare HMO | Source: Ambulatory Visit | Attending: Family Medicine | Admitting: Family Medicine

## 2017-08-05 VITALS — BP 102/72 | HR 93 | Temp 98.1°F | Resp 18 | Wt 126.4 lb

## 2017-08-05 DIAGNOSIS — R109 Unspecified abdominal pain: Secondary | ICD-10-CM

## 2017-08-05 DIAGNOSIS — K5909 Other constipation: Secondary | ICD-10-CM

## 2017-08-05 DIAGNOSIS — I959 Hypotension, unspecified: Secondary | ICD-10-CM

## 2017-08-05 DIAGNOSIS — M8580 Other specified disorders of bone density and structure, unspecified site: Secondary | ICD-10-CM | POA: Diagnosis not present

## 2017-08-05 DIAGNOSIS — D649 Anemia, unspecified: Secondary | ICD-10-CM

## 2017-08-05 DIAGNOSIS — R102 Pelvic and perineal pain: Secondary | ICD-10-CM | POA: Diagnosis not present

## 2017-08-05 DIAGNOSIS — E782 Mixed hyperlipidemia: Secondary | ICD-10-CM | POA: Diagnosis not present

## 2017-08-05 DIAGNOSIS — M533 Sacrococcygeal disorders, not elsewhere classified: Secondary | ICD-10-CM | POA: Insufficient documentation

## 2017-08-05 DIAGNOSIS — S3993XA Unspecified injury of pelvis, initial encounter: Secondary | ICD-10-CM | POA: Diagnosis not present

## 2017-08-05 DIAGNOSIS — R202 Paresthesia of skin: Secondary | ICD-10-CM | POA: Diagnosis not present

## 2017-08-05 MED ORDER — DILTIAZEM HCL 30 MG PO TABS: ORAL_TABLET | ORAL | 6 refills | Status: DC

## 2017-08-05 NOTE — Assessment & Plan Note (Addendum)
Fell on her sacrum and has had significant pain. Encouraged Advill and Tylenol twice daily. Can take up to 2 Tylenol at a time. Xray negative for fracture

## 2017-08-05 NOTE — Assessment & Plan Note (Signed)
Tolerating statin, encouraged heart healthy diet, avoid trans fats, minimize simple carbs and saturated fats. Increase exercise as tolerated 

## 2017-08-05 NOTE — Assessment & Plan Note (Signed)
Encouraged to get adequate exercise, calcium and vitamin d intake 

## 2017-08-05 NOTE — Progress Notes (Signed)
Subjective:  I acted as a Education administrator for Dr. Charlett Blake. Anne [name], [place]  Patient ID: Anne [name], female    DOB: 04-27-1941, 76 y.o.   MRN: [id number]  No chief complaint on file.   HPI  Patient is in today for abdominal pain and low back pain. She suffered a fall onto her sacrum very hard and has and had significant pain with some paresthesias into both legs since then. No incontinence. She is also noting several days of diarrhea and abdominal cramping. No fevers or chillls. No bloody or tarry stool. Notes some nausea but no vomiting. Anorexia is noted. Denies CP/palp/SOB/HA/congestion/fevers or GU c/o. Taking meds as prescribed  Patient Care Team: [name], MD as PCP - General (Family Medicine) Paralee Cancel, MD as Consulting Physician (Orthopedic Surgery) Fay Records, MD as Consulting Physician (Cardiology) Gordy Levan, MD as Consulting Physician (Oncology) Gatha Mayer, MD as Consulting Physician (Gastroenterology) Clent Jacks, MD as Consulting Physician (Ophthalmology) Roney Jaffe, DDS (Oral Surgery) Everlene Other (Dentistry) [place]ver[place], Jennefer Bravo, MD as Referring Physician (Dermatology) Dillingham, Loel Lofty, DO as Attending Physician (Plastic Surgery)   Past Medical History:  Diagnosis Date  . Abdominal pain 08/11/2017  . ALKALINE PHOSPHATASE, ELEVATED 03/15/2009  . Allergic state 06/10/2012  . Anemia   . Anxiety   . Anxiety and depression 04/28/2011  . Arthritis   . Atypical chest pain 11/30/2011  . 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 Baker's cyst of knee 05/22/2011  . Cancer ([place]) 01,  08   XRT/chemo 01-02/ lobular invasive ca  . Carotid artery disease ([place])    a. Carotid duplex 03/2014: stable 1-39% BICA, f/u due 03/2016.  [name] Chronic alcoholism in remission ([place]) 03/29/2011   Did not tolerate Klonopin, caused some confusion and bad dreams.    [name] COPD (chronic obstructive pulmonary disease) ([place])   . Dermatitis 11/23/2012  . Dysphagia     . EE (eosinophilic esophagitis)   . Elevated sed rate 08/02/2013  . Esophageal ring   . ESOPHAGEAL STRICTURE 03/29/2009  . Fall 11/23/2012  . Family history of breast cancer   . Family history of colon cancer   . Family history of ovarian cancer   . Family history of pancreatic cancer   . Folliculitis of nose 2/87/6811  . GERD (gastroesophageal reflux disease) 09/29/2009   improved s/p cholecystectomy and esophagus dilatation  . Hematuria   . History of chicken pox   . History of measles   . History of shingles    2 episodes  . Hx of echocardiogram    a. Echo 01/2013: mild LVH, EF 55-60%, normal wall motion, Gr 1 diast dysfn  . Hyperlipidemia   . Knee pain, bilateral 07/23/2011  . Medicare annual wellness visit, subsequent 06/19/2015  . Mixed hyperlipidemia 10/17/2010   Qualifier: Diagnosis of  By: Mack Guise    . Orthostasis   . Osteopenia 03/14/2011  . Personal history of chemotherapy 2001  . Personal history of radiation therapy 2001   rt breast  . 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 Pneumonia 2-10   pleurisy  . PVC's (premature ventricular contractions)    a. Event monitor 01/2013: NSR, extensive PVCs.  . Radial neck fracture 10/2011   minimally displaced  . Sinusitis, acute 12/04/2015  . Trauma 10/18/2011  . URI (upper respiratory infection) 09/02/2012  . Urinary frequency 12/12/2014  . Urinary incontinence 03/19/2012  . Vaginitis 05/22/2011    Past Surgical History:  Procedure  Laterality Date  . ANTERIOR CRUCIATE LIGAMENT REPAIR  08, 09, 10  . AUGMENTATION MAMMAPLASTY Right 05/27/2007  . BREAST BIOPSY Right 01/02/2007   wire loc  . BREAST BIOPSY  12/26/2006  . BREAST LUMPECTOMY Right 2001  . BREAST RECONSTRUCTION  2008, 2009, 2010  . BREAST REDUCTION WITH MASTOPEXY Left 05/30/2017   Procedure: LEFT BREAST REDUCTION FOR SYMTERY WITH MASTOPEXY;  Surgeon: Wallace Going, DO;  Location: [facility];  Service: Plastics;  Laterality: Left;  .  CHOLECYSTECTOMY  2010  . COLONOSCOPY  09/05/10   cecal avm's  . DENTAL SURGERY  05/2016   4 dental implants by Dr. Loyal Gambler.  [name] ERCP  2010    CBD stone extraction   . ESOPHAGOGASTRODUODENOSCOPY  01/08/2012   Procedure: ESOPHAGOGASTRODUODENOSCOPY (EGD);  Surgeon: Gatha Mayer, MD;  Location: [facility];  Service: Endoscopy;  Laterality: N/A;  . ESOPHAGOGASTRODUODENOSCOPY (EGD) WITH ESOPHAGEAL DILATION  2010, 2012  . LAPAROSCOPIC APPENDECTOMY N/A 08/04/2016   Procedure: APPENDECTOMY LAPAROSCOPIC;  Surgeon: Michael Boston, MD;  Location: [facility];  Service: General;  Laterality: N/A;  . MASTECTOMY MODIFIED RADICAL Right 05/27/2007   , Mastectomy modified radical (08), breast reconstruction, CA lesions excised lateral abd wall 2010  . SAVORY DILATION  01/08/2012   Procedure: SAVORY DILATION;  Surgeon: Gatha Mayer, MD;  Location: [facility];  Service: Endoscopy;  Laterality: N/A;  need xray    Family History  Problem Relation Age of Onset  . Heart disease Father   . Lung cancer Father        smoker  . Cirrhosis Sister        Primary Biliary  . Stroke Maternal Grandmother   . Alcohol abuse Maternal Grandfather   . Heart disease Paternal Grandfather   . Anxiety disorder Sister   . Osteoporosis Sister   . Arthritis Sister        Rheumatoid  . Osteoporosis Sister   . Skin cancer Sister        multiple skin cancers, over 75 excisions.  . Other Mother        tic [place]  . Breast cancer Maternal Aunt        dx in her 31s  . Breast cancer Paternal Aunt        dx in her 33s  . Pancreatic cancer Maternal Uncle        dx in his 83s; smoker  . Breast cancer Maternal Aunt        dx in her 48s  . Breast cancer Maternal Aunt        possible breast cancer dx and died in her 69s  . Ovarian cancer Maternal Aunt 29  . Pancreatic cancer Maternal Aunt        dx in her 57s  . Stomach cancer Maternal Uncle   . Colon cancer Maternal Uncle   . Lung cancer Paternal Aunt   . Breast cancer  Cousin        paternal first cousin  . Breast cancer Cousin        maternal first cousin  . Anesthesia problems Neg Hx   . Hypotension Neg Hx   . Malignant hyperthermia Neg Hx   . Pseudochol deficiency Neg Hx     Social History   Social History  . Marital status: Married    Spouse name: Sam  . Number of children: 3  . Years of education: N/A   Occupational History  . decorator    Social History  Main Topics  . Smoking status: Former Smoker    Packs/day: 1.00    Years: 50.00    Types: Cigarettes    Quit date: 05/22/2007  . Smokeless tobacco: Never Used  . Alcohol use Yes     Comment: social  . Drug use: No  . Sexual activity: Yes    Partners: Male     Comment: lives with husband,    Other Topics Concern  . Not on file   Social History Narrative  . No narrative on file    No facility-administered medications prior to visit.    Outpatient Medications Prior to Visit  Medication Sig Dispense Refill  . acetaminophen (TYLENOL) 500 MG tablet Take 500-1,000 mg by mouth every 6 (six) hours as needed (for pain.).    [name] amitriptyline (ELAVIL) 150 MG tablet Take 1 tablet (150 mg total) by mouth at bedtime. 30 tablet 5  . aspirin EC 81 MG tablet Take 81 mg by mouth daily.    [name] atorvastatin (LIPITOR) 40 MG tablet TAKE 1 TABLET EVERY DAY (Patient taking differently: TAKE 40 mg TABLET EVERY DAY) 90 tablet 3  . Cholecalciferol (VITAMIN D3) 1000 UNITS CAPS Take 2,000 Units by mouth daily.     . Cyanocobalamin (VITAMIN B 12 PO) Take 1 tablet by mouth daily.    . ferrous sulfate 325 (65 FE) MG tablet Take 325 mg by mouth daily with breakfast.    . LORazepam (ATIVAN) 1 MG tablet Take 1 tablet (1 mg total) by mouth every 8 (eight) hours as needed for anxiety. 70 tablet 5  . NONFORMULARY OR COMPOUNDED ITEM Budesonide Slurry  - 37m/10ml   - Take 255mdaily x 30 days with 2 refills Wait 15-30 minutes before drinking or eating after taking 300 each 2  . Probiotic Product (PROBIOTIC FORMULA  PO) Take 1 capsule by mouth daily.    . Marland KitcheniZANidine (ZANAFLEX) 2 MG tablet Take 1 tablet (2 mg total) by mouth at bedtime as needed for muscle spasms. 15 tablet 0  . triamcinolone cream (KENALOG) 0.1 % Apply 1 application topically 2 (two) times daily. (Patient taking differently: Apply 1 application topically as needed. ) 45 g 2  . diltiazem (CARDIZEM) 30 MG tablet take 1 tablet by mouth once daily FOR PALPITATIONS 30 tablet 6  . pantoprazole (PROTONIX) 40 MG tablet take 1 tablet by mouth once daily 30 tablet 3    Allergies  Allergen Reactions  . Codeine Other (See Comments)    "flu like symptoms"  . Morphine Other (See Comments)    "flu like symptoms"  . Peanut-Containing Drug Products Hives  . Sorbitol Other (See Comments)    GI Issues  . Tomato Diarrhea  . Zofran Other (See Comments)    headache  . Advil [Ibuprofen] Other (See Comments)    Irritates throat.  . Diphenhydramine Hcl Other (See Comments)    "nervous and upset"  . Oxycodone Anxiety    Confusion with inability to think clearly    Review of Systems  Constitutional: Negative for fever and malaise/fatigue.  HENT: Negative for congestion.   Eyes: Negative for blurred vision.  Respiratory: Negative for shortness of breath.   Cardiovascular: Negative for chest pain, palpitations and leg swelling.  Gastrointestinal: Positive for abdominal pain, diarrhea and nausea. Negative for blood in stool and melena.  Genitourinary: Negative for dysuria and frequency.  Musculoskeletal: Positive for back pain, falls and myalgias.  Skin: Negative for rash.  Neurological: Negative for dizziness, loss of consciousness and  headaches.  Endo/Heme/Allergies: Negative for environmental allergies.  Psychiatric/Behavioral: Negative for depression. The patient is not nervous/anxious.        Objective:    Physical Exam  Constitutional: She is oriented to person, place, and time. She appears well-developed and well-nourished. No distress.    HENT:  Head: Normocephalic and atraumatic.  Nose: Nose normal.  Eyes: Right eye exhibits no discharge. Left eye exhibits no discharge.  Neck: Normal range of motion. Neck supple.  Cardiovascular: Normal rate and regular rhythm.   No murmur heard. Pulmonary/Chest: Effort normal and breath sounds normal.  Abdominal: Soft. Bowel sounds are normal. She exhibits no distension and no mass. There is no tenderness. There is no rebound and no guarding.  Musculoskeletal: She exhibits no edema.  Neurological: She is alert and oriented to person, place, and time.  Skin: Skin is warm and dry.  Psychiatric: She has a normal mood and affect.  Nursing note and vitals reviewed.   BP 102/72 (BP Location: Left Arm, Patient Position: Sitting, Cuff Size: Normal)   Pulse 93   Temp 98.1 F (36.7 C) (Oral)   Resp 18   Wt 126 lb 6.4 oz (57.3 kg)   SpO2 100%   BMI 21.03 kg/m  Wt Readings from Last 3 Encounters:  08/11/17 130 lb (59 kg)  08/05/17 126 lb 6.4 oz (57.3 kg)  05/30/17 129 lb (58.5 kg)   BP Readings from Last 3 Encounters:  08/11/17 (!) 104/58  08/05/17 102/72  05/30/17 (!) 91/54     Immunization History  Administered Date(s) Administered  . Influenza Split 08/27/2011, 10/21/2012  . Influenza, High Dose Seasonal PF 08/01/2016  . Influenza,inj,Quad PF,6+ Mos 07/31/2013, 12/07/2014, 11/01/2015  . Tdap 04/08/2013    Health Maintenance  Topic Date Due  . PNA vac Low Risk Adult (1 of 2 - PCV13) 09/09/2007  . INFLUENZA VACCINE  06/12/2017  . MAMMOGRAM  03/29/2019  . COLONOSCOPY  09/05/2020  . TETANUS/TDAP  04/09/2023  . DEXA SCAN  Completed    Lab Results  Component Value Date   WBC 5.8 08/10/2017   HGB 11.9 (L) 08/10/2017   HCT 36.5 08/10/2017   PLT 239 08/10/2017   GLUCOSE 100 (H) 08/10/2017   CHOL 155 08/05/2017   TRIG 145.0 08/05/2017   HDL 61.90 08/05/2017   LDLDIRECT 117.4 01/13/2013   LDLCALC 64 08/05/2017   ALT 27 08/10/2017   AST 30 08/10/2017   NA 137  08/10/2017   K 3.1 (L) 08/10/2017   CL 102 08/10/2017   CREATININE 0.64 08/10/2017   BUN 18 08/10/2017   CO2 24 08/10/2017   TSH 2.33 08/05/2017   INR 1.0 03/29/2011    Lab Results  Component Value Date   TSH 2.33 08/05/2017   Lab Results  Component Value Date   WBC 5.8 08/10/2017   HGB 11.9 (L) 08/10/2017   HCT 36.5 08/10/2017   MCV 95.3 08/10/2017   PLT 239 08/10/2017   Lab Results  Component Value Date   NA 137 08/10/2017   K 3.1 (L) 08/10/2017   CHLORIDE 106 03/21/2017   CO2 24 08/10/2017   GLUCOSE 100 (H) 08/10/2017   BUN 18 08/10/2017   CREATININE 0.64 08/10/2017   BILITOT 0.5 08/10/2017   ALKPHOS 130 (H) 08/10/2017   AST 30 08/10/2017   ALT 27 08/10/2017   PROT 6.8 08/10/2017   ALBUMIN 3.5 08/10/2017   CALCIUM 8.6 (L) 08/10/2017   ANIONGAP 11 08/10/2017   EGFR 77 (L) 03/21/2017   GFR  88.18 08/05/2017   Lab Results  Component Value Date   CHOL 155 08/05/2017   Lab Results  Component Value Date   HDL 61.90 08/05/2017   Lab Results  Component Value Date   LDLCALC 64 08/05/2017   Lab Results  Component Value Date   TRIG 145.0 08/05/2017   Lab Results  Component Value Date   CHOLHDL 3 08/05/2017   No results found for: HGBA1C       Assessment & Plan:   Problem List Items Addressed This Visit    Mixed hyperlipidemia - Primary    Tolerating statin, encouraged heart healthy diet, avoid trans fats, minimize simple carbs and saturated fats. Increase exercise as tolerated      Relevant Medications   diltiazem (CARDIZEM) 30 MG tablet   Other Relevant Orders   Lipid panel (Completed)   Anemia    Increase leafy greens, consider increased lean red meat and using cast iron cookware. Continue to monitor, report any concerns      Relevant Orders   CBC with Differential/Platelet (Completed)   Comprehensive metabolic panel (Completed)   Constipation, chronic    Alternating with diarrhea      Relevant Orders   Magnesium (Completed)    Osteopenia determined by x-ray    Encouraged to get adequate exercise, calcium and vitamin d intake      Relevant Orders   Magnesium (Completed)   Sacral back pain    Fell on her sacrum and has had significant pain. Encouraged Advill and Tylenol twice daily. Can take up to 2 Tylenol at a time. Xray negative for fracture      Relevant Orders   DG Sacrum/Coccyx (Completed)   Abdominal pain    Labs ordered and unremarkable. Diarrhea was sudden onset. Possible gastroenteritis, encouraged clear fluids and bland diet and report worsening symptoms.        Other Visit Diagnoses    Paresthesias       Relevant Orders   Vitamin B1 (Completed)   Folate (Completed)   Vitamin B12 (Completed)   Hypotensive episode       Relevant Medications   diltiazem (CARDIZEM) 30 MG tablet   Other Relevant Orders   TSH (Completed)      I have changed Ms. Minter's diltiazem. I am also having her maintain her Vitamin D3, Probiotic Product (PROBIOTIC FORMULA PO), Cyanocobalamin (VITAMIN B 12 PO), ferrous sulfate, aspirin EC, acetaminophen, triamcinolone cream, LORazepam, amitriptyline, NONFORMULARY OR COMPOUNDED ITEM, and atorvastatin.  Meds ordered this encounter  Medications  . diltiazem (CARDIZEM) 30 MG tablet    Sig: take 1 tablet by mouth q am FOR PALPITATIONS    Dispense:  30 tablet    Refill:  6    CMA served as scribe during this visit. History, Physical and Plan performed by medical provider. Documentation and orders reviewed and attested to.  Penni Homans, MD

## 2017-08-05 NOTE — Assessment & Plan Note (Signed)
Alternating with diarrhea

## 2017-08-05 NOTE — Patient Instructions (Addendum)

## 2017-08-05 NOTE — Assessment & Plan Note (Signed)
Increase leafy greens, consider increased lean red meat and using cast iron cookware. Continue to monitor, report any concerns 

## 2017-08-06 ENCOUNTER — Other Ambulatory Visit: Payer: Self-pay | Admitting: Family Medicine

## 2017-08-06 ENCOUNTER — Encounter: Payer: Self-pay | Admitting: Family Medicine

## 2017-08-06 LAB — FOLATE

## 2017-08-06 LAB — CBC WITH DIFFERENTIAL/PLATELET
Basophils Absolute: 0 10*3/uL (ref 0.0–0.1)
Basophils Relative: 0.3 % (ref 0.0–3.0)
EOS PCT: 2 % (ref 0.0–5.0)
Eosinophils Absolute: 0.2 10*3/uL (ref 0.0–0.7)
HCT: 40 % (ref 36.0–46.0)
Hemoglobin: 12.9 g/dL (ref 12.0–15.0)
LYMPHS ABS: 1.2 10*3/uL (ref 0.7–4.0)
Lymphocytes Relative: 15.8 % (ref 12.0–46.0)
MCHC: 32.2 g/dL (ref 30.0–36.0)
MCV: 96.1 fl (ref 78.0–100.0)
MONO ABS: 0.5 10*3/uL (ref 0.1–1.0)
Monocytes Relative: 6.7 % (ref 3.0–12.0)
NEUTROS ABS: 5.9 10*3/uL (ref 1.4–7.7)
NEUTROS PCT: 75.2 % (ref 43.0–77.0)
PLATELETS: 254 10*3/uL (ref 150.0–400.0)
RBC: 4.17 Mil/uL (ref 3.87–5.11)
RDW: 16.1 % — AB (ref 11.5–15.5)
WBC: 7.9 10*3/uL (ref 4.0–10.5)

## 2017-08-06 LAB — COMPREHENSIVE METABOLIC PANEL
ALT: 16 U/L (ref 0–35)
AST: 23 U/L (ref 0–37)
Albumin: 4 g/dL (ref 3.5–5.2)
Alkaline Phosphatase: 99 U/L (ref 39–117)
BUN: 17 mg/dL (ref 6–23)
CHLORIDE: 102 meq/L (ref 96–112)
CO2: 30 meq/L (ref 19–32)
Calcium: 9.1 mg/dL (ref 8.4–10.5)
Creatinine, Ser: 0.69 mg/dL (ref 0.40–1.20)
GFR: 88.18 mL/min (ref >60.00)
GLUCOSE: 97 mg/dL (ref 70–99)
POTASSIUM: 4 meq/L (ref 3.5–5.1)
SODIUM: 138 meq/L (ref 135–145)
TOTAL PROTEIN: 7 g/dL (ref 6.0–8.3)
Total Bilirubin: 0.6 mg/dL (ref 0.2–1.2)

## 2017-08-06 LAB — LIPID PANEL
CHOL/HDL RATIO: 3
Cholesterol: 155 mg/dL (ref 0–200)
HDL: 61.9 mg/dL (ref >39.00)
LDL CALC: 64 mg/dL (ref 0–99)
NONHDL: 93.31
Triglycerides: 145 mg/dL (ref 0.0–149.0)
VLDL: 29 mg/dL (ref 0.0–40.0)

## 2017-08-06 LAB — VITAMIN B12: VITAMIN B 12: 286 pg/mL (ref 211–911)

## 2017-08-06 LAB — MAGNESIUM: Magnesium: 1.8 mg/dL (ref 1.5–2.5)

## 2017-08-06 LAB — TSH: TSH: 2.33 u[IU]/mL (ref 0.35–4.50)

## 2017-08-06 MED ORDER — ACYCLOVIR 400 MG PO TABS: 800.0000 mg | ORAL_TABLET | Freq: Every day | ORAL | 0 refills | Status: DC

## 2017-08-08 ENCOUNTER — Other Ambulatory Visit: Payer: Self-pay

## 2017-08-08 MED ORDER — PANTOPRAZOLE SODIUM 40 MG PO TBEC: 40.0000 mg | DELAYED_RELEASE_TABLET | Freq: Every day | ORAL | 0 refills | Status: DC

## 2017-08-08 NOTE — Telephone Encounter (Signed)
Faxed Rx for Pantoprazole for 90d per ins req/thx dmf

## 2017-08-10 ENCOUNTER — Emergency Department (HOSPITAL_BASED_OUTPATIENT_CLINIC_OR_DEPARTMENT_OTHER): Payer: Medicare HMO

## 2017-08-10 ENCOUNTER — Encounter (HOSPITAL_BASED_OUTPATIENT_CLINIC_OR_DEPARTMENT_OTHER): Payer: Self-pay | Admitting: Emergency Medicine

## 2017-08-10 ENCOUNTER — Inpatient Hospital Stay (HOSPITAL_BASED_OUTPATIENT_CLINIC_OR_DEPARTMENT_OTHER)
Admission: EM | Admit: 2017-08-10 | Discharge: 2017-08-12 | DRG: 392 | Disposition: A | Discharge status: Home / Self Care | Payer: Medicare HMO | Attending: Internal Medicine | Admitting: Internal Medicine

## 2017-08-10 DIAGNOSIS — J449 Chronic obstructive pulmonary disease, unspecified: Secondary | ICD-10-CM | POA: Diagnosis present

## 2017-08-10 DIAGNOSIS — K529 Noninfective gastroenteritis and colitis, unspecified: Principal | ICD-10-CM | POA: Diagnosis present

## 2017-08-10 DIAGNOSIS — Z7982 Long term (current) use of aspirin: Secondary | ICD-10-CM

## 2017-08-10 DIAGNOSIS — Z9049 Acquired absence of other specified parts of digestive tract: Secondary | ICD-10-CM

## 2017-08-10 DIAGNOSIS — Z9221 Personal history of antineoplastic chemotherapy: Secondary | ICD-10-CM | POA: Diagnosis not present

## 2017-08-10 DIAGNOSIS — Z801 Family history of malignant neoplasm of trachea, bronchus and lung: Secondary | ICD-10-CM

## 2017-08-10 DIAGNOSIS — R109 Unspecified abdominal pain: Secondary | ICD-10-CM | POA: Diagnosis not present

## 2017-08-10 DIAGNOSIS — K5520 Angiodysplasia of colon without hemorrhage: Secondary | ICD-10-CM | POA: Diagnosis present

## 2017-08-10 DIAGNOSIS — R197 Diarrhea, unspecified: Secondary | ICD-10-CM | POA: Diagnosis not present

## 2017-08-10 DIAGNOSIS — E782 Mixed hyperlipidemia: Secondary | ICD-10-CM | POA: Diagnosis present

## 2017-08-10 DIAGNOSIS — Z79899 Other long term (current) drug therapy: Secondary | ICD-10-CM

## 2017-08-10 DIAGNOSIS — Z8262 Family history of osteoporosis: Secondary | ICD-10-CM

## 2017-08-10 DIAGNOSIS — Z8041 Family history of malignant neoplasm of ovary: Secondary | ICD-10-CM | POA: Diagnosis not present

## 2017-08-10 DIAGNOSIS — Z8261 Family history of arthritis: Secondary | ICD-10-CM

## 2017-08-10 DIAGNOSIS — K222 Esophageal obstruction: Secondary | ICD-10-CM | POA: Diagnosis present

## 2017-08-10 DIAGNOSIS — Z800 Family history of malignant neoplasm of digestive organs: Secondary | ICD-10-CM | POA: Diagnosis not present

## 2017-08-10 DIAGNOSIS — E876 Hypokalemia: Secondary | ICD-10-CM

## 2017-08-10 DIAGNOSIS — Z91010 Allergy to peanuts: Secondary | ICD-10-CM | POA: Diagnosis not present

## 2017-08-10 DIAGNOSIS — K200 Eosinophilic esophagitis: Secondary | ICD-10-CM | POA: Diagnosis present

## 2017-08-10 DIAGNOSIS — Z886 Allergy status to analgesic agent status: Secondary | ICD-10-CM

## 2017-08-10 DIAGNOSIS — Z803 Family history of malignant neoplasm of breast: Secondary | ICD-10-CM

## 2017-08-10 DIAGNOSIS — F329 Major depressive disorder, single episode, unspecified: Secondary | ICD-10-CM | POA: Diagnosis present

## 2017-08-10 DIAGNOSIS — Z808 Family history of malignant neoplasm of other organs or systems: Secondary | ICD-10-CM | POA: Diagnosis not present

## 2017-08-10 DIAGNOSIS — K5900 Constipation, unspecified: Secondary | ICD-10-CM | POA: Diagnosis present

## 2017-08-10 DIAGNOSIS — Z885 Allergy status to narcotic agent status: Secondary | ICD-10-CM

## 2017-08-10 DIAGNOSIS — F419 Anxiety disorder, unspecified: Secondary | ICD-10-CM | POA: Diagnosis present

## 2017-08-10 DIAGNOSIS — K56609 Unspecified intestinal obstruction, unspecified as to partial versus complete obstruction: Secondary | ICD-10-CM | POA: Diagnosis not present

## 2017-08-10 DIAGNOSIS — Z818 Family history of other mental and behavioral disorders: Secondary | ICD-10-CM

## 2017-08-10 DIAGNOSIS — Z9011 Acquired absence of right breast and nipple: Secondary | ICD-10-CM

## 2017-08-10 DIAGNOSIS — R0902 Hypoxemia: Secondary | ICD-10-CM | POA: Diagnosis not present

## 2017-08-10 DIAGNOSIS — K219 Gastro-esophageal reflux disease without esophagitis: Secondary | ICD-10-CM | POA: Diagnosis present

## 2017-08-10 DIAGNOSIS — Z888 Allergy status to other drugs, medicaments and biological substances status: Secondary | ICD-10-CM

## 2017-08-10 DIAGNOSIS — Z8249 Family history of ischemic heart disease and other diseases of the circulatory system: Secondary | ICD-10-CM

## 2017-08-10 DIAGNOSIS — Z91018 Allergy to other foods: Secondary | ICD-10-CM | POA: Diagnosis not present

## 2017-08-10 DIAGNOSIS — Z853 Personal history of malignant neoplasm of breast: Secondary | ICD-10-CM

## 2017-08-10 DIAGNOSIS — Z8619 Personal history of other infectious and parasitic diseases: Secondary | ICD-10-CM

## 2017-08-10 DIAGNOSIS — Z823 Family history of stroke: Secondary | ICD-10-CM

## 2017-08-10 DIAGNOSIS — M8580 Other specified disorders of bone density and structure, unspecified site: Secondary | ICD-10-CM | POA: Diagnosis present

## 2017-08-10 DIAGNOSIS — D649 Anemia, unspecified: Secondary | ICD-10-CM | POA: Diagnosis not present

## 2017-08-10 DIAGNOSIS — Z923 Personal history of irradiation: Secondary | ICD-10-CM

## 2017-08-10 DIAGNOSIS — I700 Atherosclerosis of aorta: Secondary | ICD-10-CM | POA: Diagnosis present

## 2017-08-10 DIAGNOSIS — Z5329 Procedure and treatment not carried out because of patient's decision for other reasons: Secondary | ICD-10-CM

## 2017-08-10 DIAGNOSIS — Z87891 Personal history of nicotine dependence: Secondary | ICD-10-CM

## 2017-08-10 DIAGNOSIS — Z811 Family history of alcohol abuse and dependence: Secondary | ICD-10-CM

## 2017-08-10 LAB — CBC WITH DIFFERENTIAL/PLATELET
BASOS ABS: 0 10*3/uL (ref 0.0–0.1)
BASOS PCT: 0 %
Eosinophils Absolute: 0.1 10*3/uL (ref 0.0–0.7)
Eosinophils Relative: 1 %
HCT: 36.5 % (ref 36.0–46.0)
Hemoglobin: 11.9 g/dL — ABNORMAL LOW (ref 12.0–15.0)
LYMPHS ABS: 1.3 10*3/uL (ref 0.7–4.0)
Lymphocytes Relative: 22 %
MCH: 31.1 pg (ref 26.0–34.0)
MCHC: 32.6 g/dL (ref 30.0–36.0)
MCV: 95.3 fL (ref 78.0–100.0)
Monocytes Absolute: 0.7 10*3/uL (ref 0.1–1.0)
Monocytes Relative: 12 %
NEUTROS ABS: 3.7 10*3/uL (ref 1.7–7.7)
NEUTROS PCT: 65 %
PLATELETS: 239 10*3/uL (ref 150–400)
RBC: 3.83 MIL/uL — ABNORMAL LOW (ref 3.87–5.11)
RDW: 14.3 % (ref 11.5–15.5)
WBC: 5.8 10*3/uL (ref 4.0–10.5)

## 2017-08-10 LAB — COMPREHENSIVE METABOLIC PANEL
ALBUMIN: 3.5 g/dL (ref 3.5–5.0)
ALK PHOS: 130 U/L — AB (ref 38–126)
ALT: 27 U/L (ref 14–54)
ANION GAP: 11 (ref 5–15)
AST: 30 U/L (ref 15–41)
BUN: 18 mg/dL (ref 6–20)
CALCIUM: 8.6 mg/dL — AB (ref 8.9–10.3)
CHLORIDE: 102 mmol/L (ref 101–111)
CO2: 24 mmol/L (ref 22–32)
Creatinine, Ser: 0.64 mg/dL (ref 0.44–1.00)
GFR calc non Af Amer: >60 mL/min (ref >60)
Glucose, Bld: 100 mg/dL — ABNORMAL HIGH (ref 65–99)
POTASSIUM: 3.1 mmol/L — AB (ref 3.5–5.1)
SODIUM: 137 mmol/L (ref 135–145)
Total Bilirubin: 0.5 mg/dL (ref 0.3–1.2)
Total Protein: 6.8 g/dL (ref 6.5–8.1)

## 2017-08-10 LAB — LIPASE, BLOOD: Lipase: 24 U/L (ref 11–51)

## 2017-08-10 MED ORDER — SODIUM CHLORIDE 0.9 % IV SOLN
INTRAVENOUS | Status: DC
  Administered 2017-08-10: 22:00:00 via INTRAVENOUS

## 2017-08-10 MED ORDER — FENTANYL CITRATE (PF) 100 MCG/2ML IJ SOLN
12.5000 ug | Freq: Once | INTRAMUSCULAR | Status: AC
  Administered 2017-08-10: 12.5 ug via INTRAVENOUS
  Filled 2017-08-10: qty 2

## 2017-08-10 MED ORDER — LIDOCAINE 4 % EX CREA
TOPICAL_CREAM | Freq: Once | CUTANEOUS | Status: AC
  Administered 2017-08-10: 1 via TOPICAL
  Filled 2017-08-10: qty 5

## 2017-08-10 MED ORDER — POTASSIUM CHLORIDE 10 MEQ/100ML IV SOLN
10.0000 meq | INTRAVENOUS | Status: AC
  Administered 2017-08-11 (×4): 10 meq via INTRAVENOUS
  Filled 2017-08-10 (×4): qty 100

## 2017-08-10 MED ORDER — LOPERAMIDE HCL 2 MG PO CAPS
2.0000 mg | ORAL_CAPSULE | Freq: Once | ORAL | Status: AC
  Administered 2017-08-10: 2 mg via ORAL
  Filled 2017-08-10: qty 1

## 2017-08-10 MED ORDER — IOPAMIDOL (ISOVUE-300) INJECTION 61%
100.0000 mL | Freq: Once | INTRAVENOUS | Status: AC | PRN
  Administered 2017-08-10: 100 mL via INTRAVENOUS

## 2017-08-10 NOTE — ED Provider Notes (Signed)
[facility] Provider Note   CSN: [id number] Arrival date & time: 08/10/17  1949     History   Chief Complaint Chief Complaint  Patient presents with  . Abdominal Pain  . Diarrhea    HPI Anne [name] is a 76 y.o. female.  HPI Patient presents with concern of abdominal pain and diarrhea. Onset was one week ago, since onset symptoms of been persistent, bilateral throbbing sore abdominal pain, as well as multiple episodes of diarrhea, generalized weakness. There is associated anorexia, nausea, but no vomiting. Her also subjective fever and chills. Patient saw her physician 5 days ago. Since onset no clear alleviating or exacerbating factors.   Past Medical History:  Diagnosis Date  . ALKALINE PHOSPHATASE, ELEVATED 03/15/2009  . Allergic state 06/10/2012  . Anemia   . Anxiety   . Anxiety and depression 04/28/2011  . Arthritis   . Atypical chest pain 11/30/2011  . 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 Baker's cyst of knee 05/22/2011  . Cancer ([place]) 01,  08   XRT/chemo 01-02/ lobular invasive ca  . Carotid artery disease ([place])    a. Carotid duplex 03/2014: stable 1-39% BICA, f/u due 03/2016.  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    [name] COPD (chronic obstructive pulmonary disease) ([place])   . Dermatitis 11/23/2012  . Dysphagia   . EE (eosinophilic esophagitis)   . Elevated sed rate 08/02/2013  . Esophageal ring   . ESOPHAGEAL STRICTURE 03/29/2009  . Fall 11/23/2012  . Family history of breast cancer   . Family history of colon cancer   . Family history of ovarian cancer   . Family history of pancreatic cancer   . Folliculitis of nose 02/02/5572  . GERD (gastroesophageal reflux disease) 09/29/2009   improved s/p cholecystectomy and esophagus dilatation  . Hematuria   . History of chicken pox   . History of measles   . History of shingles    2 episodes  . Hx of  echocardiogram    a. Echo 01/2013: mild LVH, EF 55-60%, normal wall motion, Gr 1 diast dysfn  . Hyperlipidemia   . Knee pain, bilateral 07/23/2011  . Medicare annual wellness visit, subsequent 06/19/2015  . Mixed hyperlipidemia 10/17/2010   Qualifier: Diagnosis of  By: Mack Guise    . Orthostasis   . Osteopenia 03/14/2011  . Personal history of chemotherapy 2001  . Personal history of radiation therapy 2001   rt breast  . 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 Pneumonia 2-10   pleurisy  . PVC's (premature ventricular contractions)    a. Event monitor 01/2013: NSR, extensive PVCs.  . Radial neck fracture 10/2011   minimally displaced  . Sinusitis, acute 12/04/2015  . Trauma 10/18/2011  . URI (upper respiratory infection) 09/02/2012  . Urinary frequency 12/12/2014  . Urinary incontinence 03/19/2012  . Vaginitis 05/22/2011    Patient Active Problem List   Diagnosis Date Noted  . Sacral back pain 08/05/2017  . History of right breast cancer 03/21/2017  . Genetic testing 09/13/2016  . Family history of breast cancer   . Family history of ovarian cancer   . Family history of pancreatic cancer   . Family history of colon cancer   . Acute appendicitis s/p lap appendectomy 08/04/2016 08/03/2016  . Pain in the chest 05/16/2016  . Status post right breast reconstruction 05/11/2016  . Breast cancer metastasized  to skin ([place]) 05/11/2016  . History of breast cancer in female 05/11/2016  . Osteopenia determined by x-ray 05/11/2016  . IBS (irritable bowel syndrome) 03/09/2016  . Sinusitis, acute 12/04/2015  . Shortness of breath 06/19/2015  . Medicare annual wellness visit, subsequent 06/19/2015  . Urinary frequency 12/12/2014  . Breast cancer, right breast ([place]) 10/18/2014  . Superficial thrombophlebitis 03/16/2014  . Plant dermatitis 03/16/2014  . Chest wall pain 02/07/2014  . Primary localized osteoarthrosis, lower leg 01/21/2014  . Elevated sed rate 08/02/2013  . Dermatitis  11/23/2012  . Preventative health care 10/21/2012  . Allergic state 06/10/2012  . Urinary incontinence 03/19/2012  . Anemia 12/21/2011  . Knee pain, bilateral 07/23/2011  . Baker's cyst of knee 05/22/2011  . Eosinophilic esophagitis 75/64/3329  . Anxiety and depression 04/28/2011  . 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 Constipation, chronic 03/15/2011  . Mixed hyperlipidemia 10/17/2010  . CAROTID ARTERY STENOSIS 01/13/2010  . GERD 09/29/2009  . 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 ESOPHAGEAL STRICTURE 03/29/2009    Past Surgical History:  Procedure Laterality Date  . ANTERIOR CRUCIATE LIGAMENT REPAIR  08, 09, 10  . AUGMENTATION MAMMAPLASTY Right 05/27/2007  . BREAST BIOPSY Right 01/02/2007   wire loc  . BREAST BIOPSY  12/26/2006  . BREAST LUMPECTOMY Right 2001  . BREAST RECONSTRUCTION  2008, 2009, 2010  . BREAST REDUCTION WITH MASTOPEXY Left 05/30/2017   Procedure: LEFT BREAST REDUCTION FOR SYMTERY WITH MASTOPEXY;  Surgeon: Wallace Going, DO;  Location: [facility];  Service: Plastics;  Laterality: Left;  . CHOLECYSTECTOMY  2010  . COLONOSCOPY  09/05/10   cecal avm's  . DENTAL SURGERY  05/2016   4 dental implants by Dr. Loyal Gambler.  [name] ERCP  2010    CBD stone extraction   . ESOPHAGOGASTRODUODENOSCOPY  01/08/2012   Procedure: ESOPHAGOGASTRODUODENOSCOPY (EGD);  Surgeon: Gatha Mayer, MD;  Location: [facility];  Service: Endoscopy;  Laterality: N/A;  . ESOPHAGOGASTRODUODENOSCOPY (EGD) WITH ESOPHAGEAL DILATION  2010, 2012  . LAPAROSCOPIC APPENDECTOMY N/A 08/04/2016   Procedure: APPENDECTOMY LAPAROSCOPIC;  Surgeon: Michael Boston, MD;  Location: [facility];  Service: General;  Laterality: N/A;  . MASTECTOMY MODIFIED RADICAL Right 05/27/2007   , Mastectomy modified radical (08), breast reconstruction, CA lesions excised lateral abd wall 2010  . SAVORY DILATION  01/08/2012   Procedure: SAVORY DILATION;  Surgeon: Gatha Mayer, MD;  Location: [facility];  Service: Endoscopy;  Laterality: N/A;  need xray    OB History    No data available       Home Medications    Prior to Admission medications   Medication Sig Start Date End Date Taking? Authorizing Provider  acetaminophen (TYLENOL) 500 MG tablet Take 500-1,000 mg by mouth every 6 (six) hours as needed (for pain.).    [provider]  acyclovir (ZOVIRAX) 400 MG tablet Take 2 tablets (800 mg total) by mouth 5 (five) times daily. 08/06/17   [name], MD  amitriptyline (ELAVIL) 150 MG tablet Take 1 tablet (150 mg total) by mouth at bedtime. 03/11/17   [name], MD  aspirin EC 81 MG tablet Take 81 mg by mouth daily.    [provider]  atorvastatin (LIPITOR) 40 MG tablet TAKE 1 TABLET EVERY DAY 06/20/17   [name], MD  Cholecalciferol (VITAMIN D3) 1000 UNITS CAPS Take 2 tablets by mouth daily.      [provider]  Cyanocobalamin (VITAMIN B 12 PO) Take  1 tablet by mouth daily.    [provider]  diltiazem (CARDIZEM) 30 MG tablet take 1 tablet by mouth q am FOR PALPITATIONS 08/05/17   [name], MD  ferrous sulfate 325 (65 FE) MG tablet Take 325 mg by mouth daily with breakfast.    [provider]  LORazepam (ATIVAN) 1 MG tablet Take 1 tablet (1 mg total) by mouth every 8 (eight) hours as needed for anxiety. 03/11/17   [name], MD  NONFORMULARY OR COMPOUNDED ITEM Budesonide Slurry  - 2mg /30ml   - Take 2mg  daily x 30 days with 2 refills Wait 15-30 minutes before drinking or eating after taking 05/27/17   Gatha Mayer, MD  pantoprazole (PROTONIX) 40 MG tablet Take 1 tablet (40 mg total) by mouth daily. 08/08/17   [name], MD  Probiotic Product (PROBIOTIC FORMULA PO) Take 1 capsule by mouth daily.    [provider]  triamcinolone cream (KENALOG) 0.1 % Apply 1 application topically 2 (two) times daily. 03/11/17   [name], MD    Family History Family History  Problem Relation Age of  Onset  . Heart disease Father   . Lung cancer Father        smoker  . Cirrhosis Sister        Primary Biliary  . Stroke Maternal Grandmother   . Alcohol abuse Maternal Grandfather   . Heart disease Paternal Grandfather   . Anxiety disorder Sister   . Osteoporosis Sister   . Arthritis Sister        Rheumatoid  . Osteoporosis Sister   . Skin cancer Sister        multiple skin cancers, over 60 excisions.  . Other Mother        tic [place]  . Breast cancer Maternal Aunt        dx in her 21s  . Breast cancer Paternal Aunt        dx in her 33s  . Pancreatic cancer Maternal Uncle        dx in his 30s; smoker  . Breast cancer Maternal Aunt        dx in her 80s  . Breast cancer Maternal Aunt        possible breast cancer dx and died in her 2s  . Ovarian cancer Maternal Aunt 29  . Pancreatic cancer Maternal Aunt        dx in her 38s  . Stomach cancer Maternal Uncle   . Colon cancer Maternal Uncle   . Lung cancer Paternal Aunt   . Breast cancer Cousin        paternal first cousin  . Breast cancer Cousin        maternal first cousin  . Anesthesia problems Neg Hx   . Hypotension Neg Hx   . Malignant hyperthermia Neg Hx   . Pseudochol deficiency Neg Hx     Social History Social History  Substance Use Topics  . Smoking status: Former Smoker    Packs/day: 1.00    Years: 50.00    Types: Cigarettes    Quit date: 05/22/2007  . Smokeless tobacco: Never Used  . Alcohol use Yes     Comment: social     Allergies   Codeine; Morphine; Peanut-containing drug products; Sorbitol; Tomato; Zofran; Advil [ibuprofen]; and Diphenhydramine hcl   Review of Systems Review of Systems  Constitutional:       Per HPI, otherwise negative  HENT:  Per HPI, otherwise negative  Respiratory:       Per HPI, otherwise negative  Cardiovascular:       Per HPI, otherwise negative  Gastrointestinal: Positive for abdominal pain, diarrhea and nausea. Negative for vomiting.  Endocrine:        Negative aside from HPI  Genitourinary:       Neg aside from HPI   Musculoskeletal:       Per HPI, otherwise negative  Skin: Negative.        Healing from recent shingles infection, posterior  Allergic/Immunologic:       Breast cancer recently  Neurological: Negative for syncope.     Physical Exam Updated Vital Signs BP (!) 128/97 (BP Location: Left Arm)   Pulse (!) 108   Temp 98.3 F (36.8 C) (Oral)   Resp 20   Wt 56.7 kg (125 lb)   SpO2 98%   BMI 20.80 kg/m   Physical Exam  Constitutional: She is oriented to person, place, and time. She appears well-developed and well-nourished. No distress.  HENT:  Head: Normocephalic and atraumatic.  Eyes: Conjunctivae and EOM are normal.  Cardiovascular: Normal rate and regular rhythm.   Pulmonary/Chest: Effort normal and breath sounds normal. No stridor. No respiratory distress.  Abdominal: She exhibits no distension. There is tenderness.  Bilateral lower abdominal tenderness  Musculoskeletal: She exhibits no edema.  Neurological: She is alert and oriented to person, place, and time. No cranial nerve deficit.  Skin: Skin is warm and dry.  Psychiatric: She has a normal mood and affect.  Nursing note and vitals reviewed.    ED Treatments / Results  Labs (all labs ordered are listed, but only abnormal results are displayed) Labs Reviewed  COMPREHENSIVE METABOLIC PANEL - Abnormal; Notable for the following:       Result Value   Potassium 3.1 (*)    Glucose, Bld 100 (*)    Calcium 8.6 (*)    Alkaline Phosphatase 130 (*)    All other components within normal limits  CBC WITH DIFFERENTIAL/PLATELET - Abnormal; Notable for the following:    RBC 3.83 (*)    Hemoglobin 11.9 (*)    All other components within normal limits  LIPASE, BLOOD  URINALYSIS, ROUTINE W REFLEX MICROSCOPIC     Radiology Ct Abdomen Pelvis W Contrast  Result Date: 08/10/2017 CLINICAL DATA:  Diarrhea for 6 days. EXAM: CT ABDOMEN AND PELVIS WITH CONTRAST  TECHNIQUE: Multidetector CT imaging of the abdomen and pelvis was performed using the standard protocol following bolus administration of intravenous contrast. CONTRAST:  149mL ISOVUE-300 IOPAMIDOL (ISOVUE-300) INJECTION 61% COMPARISON:  08/03/2016 FINDINGS: Lower chest: No acute abnormality. Hepatobiliary: No focal liver abnormality. Previous cholecystectomy. Mild intrahepatic biliary dilatation. Similar to previous exam. Chronic increase caliber of the CBD which measures 9 mm, image 30 of series 2. Pancreas: Unremarkable. No pancreatic ductal dilatation or surrounding inflammatory changes. Spleen: Calcified granulomas identified within the spleen. Adrenals/Urinary Tract: The adrenal glands are normal. Unremarkable appearance of both kidneys. No mass or hydronephrosis. The urinary bladder appears normal. Stomach/Bowel: The stomach appears unremarkable. The proximal and mid small bowel loops are increased in caliber measuring up to 3 cm. Transition to decreased caliber distal small bowel loops noted within the right lower quadrant of the abdomen. Unremarkable appearance of the colon. Vascular/Lymphatic: Aortic atherosclerosis. No aneurysm. Portacaval node measures 8 mm. No adenopathy identified within the abdomen or pelvis. No inguinal adenopathy. Reproductive: Uterus and bilateral adnexa are unremarkable. Other: No free fluid or fluid collections  within the abdomen or pelvis. Musculoskeletal: No acute or significant osseous findings. IMPRESSION: 1. Increase caliber of the proximal and mid small bowel loops measuring up to 3 cm and suspicious for small bowel obstruction. There is a transition to decreased caliber distal small bowel loops within the right lower quadrant of the abdomen and pelvis. 2. No significant free fluid or fluid collections identified. 3. Status post cholecystectomy with chronic increase caliber of the common bile duct and intrahepatic ducts. 4.  Aortic Atherosclerosis (ICD10-I70.0).  Electronically Signed   By: Kerby Moors M.D.   On: 08/10/2017 22:18    Procedures Procedures (including critical care time)  Medications Ordered in ED Medications  0.9 %  sodium chloride infusion ( Intravenous New Bag/Given 08/10/17 2140)  fentaNYL (SUBLIMAZE) injection 12.5 mcg (12.5 mcg Intravenous Given 08/10/17 2220)  iopamidol (ISOVUE-300) 61 % injection 100 mL (100 mLs Intravenous Contrast Given 08/10/17 2144)  loperamide (IMODIUM) capsule 2 mg (2 mg Oral Given 08/10/17 2220)  lidocaine (LMX) 4 % cream (1 application Topical Given 08/10/17 2300)     Initial Impression / Assessment and Plan / ED Course  I have reviewed the triage vital signs and the nursing notes.  Pertinent labs & imaging results that were available during my care of the patient were reviewed by me and considered in my medical decision making (see chart for details).    11:11 PM On repeat exam the patient is awake and alert. I discussed all findings with patient and her husband. She clarifies that prior to this episode of diarrhea she has had constipation, and minimal stool production for some time. She notes that her abdominal pain is slightly better than on arrival. Patient has history of appendectomy one year ago, with Dr. Johney Maine, she denies other abdominal surgery. She does add that she has been diagnosed with redundant bowel.  Given the concerning CT scan, and the patient's presentation for abdominal pain, after substantial constipation, now with loose stool, some suspicion for bowel obstruction but other considerations including colitis, patient will be admitted for further evaluation and management.  Potassium repleted via IV Final Clinical Impressions(s) / ED Diagnoses  Abdominal pain Hypokalemia    Carmin Muskrat, MD 08/10/17 2356

## 2017-08-10 NOTE — Plan of Care (Signed)
  Diarrhea and abdominal pain for a week. SBO on CT abdomen/pelvis. CBC shows mild anemia, but otherwise unremarkable. Potassium 3.1.  Accepted to MedSurg bed for evaluation and treatment of SBO. Surgery has been paged by ED. Please re-consult in AM if the patient has not arrived by then to the facility.    CLINICAL DATA:  Diarrhea for 6 days.  EXAM: CT ABDOMEN AND PELVIS WITH CONTRAST  IMPRESSION: 1. Increase caliber of the proximal and mid small bowel loops measuring up to 3 cm and suspicious for small bowel obstruction. There is a transition to decreased caliber distal small bowel loops within the right lower quadrant of the abdomen and pelvis. 2. No significant free fluid or fluid collections identified. 3. Status post cholecystectomy with chronic increase caliber of the common bile duct and intrahepatic ducts. 4.  Aortic Atherosclerosis (ICD10-I70.0).  Tennis Must, MD

## 2017-08-10 NOTE — ED Triage Notes (Addendum)
PT presents with c/o diarrhea and abdominal pain for a week. Pt reports burning pain to her lower back and has had shingles 5 times and states this feels like shingles again.Pt was seen at her PCP for this on Monday and was told she had internal abdominal shingles. . PT reports weakness and shortness of breath. And feels dehydrated and abdomin feel swollen.

## 2017-08-11 ENCOUNTER — Inpatient Hospital Stay (HOSPITAL_COMMUNITY): Payer: Medicare HMO

## 2017-08-11 ENCOUNTER — Encounter: Payer: Self-pay | Admitting: Family Medicine

## 2017-08-11 ENCOUNTER — Encounter (HOSPITAL_COMMUNITY): Payer: Self-pay | Admitting: Internal Medicine

## 2017-08-11 DIAGNOSIS — K56609 Unspecified intestinal obstruction, unspecified as to partial versus complete obstruction: Secondary | ICD-10-CM

## 2017-08-11 DIAGNOSIS — E876 Hypokalemia: Secondary | ICD-10-CM

## 2017-08-11 DIAGNOSIS — R109 Unspecified abdominal pain: Secondary | ICD-10-CM | POA: Insufficient documentation

## 2017-08-11 LAB — URINALYSIS, ROUTINE W REFLEX MICROSCOPIC
BILIRUBIN URINE: NEGATIVE
Glucose, UA: NEGATIVE mg/dL
Hgb urine dipstick: NEGATIVE
KETONES UR: NEGATIVE mg/dL
Leukocytes, UA: NEGATIVE
NITRITE: NEGATIVE
PH: 5 (ref 5.0–8.0)
Protein, ur: NEGATIVE mg/dL
Specific Gravity, Urine: 1.03 (ref 1.005–1.030)

## 2017-08-11 LAB — MAGNESIUM: Magnesium: 1.5 mg/dL — ABNORMAL LOW (ref 1.7–2.4)

## 2017-08-11 LAB — VITAMIN B1: VITAMIN B1 (THIAMINE): 8 nmol/L (ref 8–30)

## 2017-08-11 MED ORDER — MORPHINE SULFATE (PF) 4 MG/ML IV SOLN
2.0000 mg | INTRAVENOUS | Status: DC | PRN
  Administered 2017-08-11 (×2): 2 mg via INTRAVENOUS
  Filled 2017-08-11 (×2): qty 1

## 2017-08-11 MED ORDER — MAGNESIUM SULFATE 4 GM/100ML IV SOLN
4.0000 g | Freq: Once | INTRAVENOUS | Status: AC
  Administered 2017-08-11: 4 g via INTRAVENOUS
  Filled 2017-08-11: qty 100

## 2017-08-11 MED ORDER — DILTIAZEM HCL 60 MG PO TABS
30.0000 mg | ORAL_TABLET | Freq: Every morning | ORAL | Status: DC
  Administered 2017-08-11 – 2017-08-12 (×2): 30 mg via ORAL
  Filled 2017-08-11 (×2): qty 1

## 2017-08-11 MED ORDER — ASPIRIN EC 81 MG PO TBEC
81.0000 mg | DELAYED_RELEASE_TABLET | Freq: Every day | ORAL | Status: DC
  Administered 2017-08-11 – 2017-08-12 (×2): 81 mg via ORAL
  Filled 2017-08-11 (×2): qty 1

## 2017-08-11 MED ORDER — POTASSIUM CHLORIDE IN NACL 20-0.9 MEQ/L-% IV SOLN
INTRAVENOUS | Status: DC
  Administered 2017-08-11: 01:00:00 via INTRAVENOUS
  Filled 2017-08-11: qty 1000

## 2017-08-11 MED ORDER — ENOXAPARIN SODIUM 40 MG/0.4ML ~~LOC~~ SOLN
40.0000 mg | SUBCUTANEOUS | Status: DC
  Administered 2017-08-11 – 2017-08-12 (×2): 40 mg via SUBCUTANEOUS
  Filled 2017-08-11 (×3): qty 0.4

## 2017-08-11 MED ORDER — ACETAMINOPHEN 650 MG RE SUPP: 650.0000 mg | Freq: Four times a day (QID) | RECTAL | Status: DC | PRN

## 2017-08-11 MED ORDER — PROCHLORPERAZINE EDISYLATE 5 MG/ML IJ SOLN: 10.0000 mg | Freq: Four times a day (QID) | INTRAMUSCULAR | Status: DC | PRN

## 2017-08-11 MED ORDER — ATORVASTATIN CALCIUM 40 MG PO TABS
40.0000 mg | ORAL_TABLET | Freq: Every day | ORAL | Status: DC
  Administered 2017-08-11 – 2017-08-12 (×2): 40 mg via ORAL
  Filled 2017-08-11 (×2): qty 1

## 2017-08-11 MED ORDER — LORAZEPAM 1 MG PO TABS: 1.0000 mg | ORAL_TABLET | Freq: Three times a day (TID) | ORAL | Status: DC | PRN

## 2017-08-11 MED ORDER — AMITRIPTYLINE HCL 50 MG PO TABS
150.0000 mg | ORAL_TABLET | Freq: Every day | ORAL | Status: DC
  Administered 2017-08-11: 150 mg via ORAL
  Filled 2017-08-11: qty 3

## 2017-08-11 MED ORDER — POTASSIUM CHLORIDE IN NACL 20-0.9 MEQ/L-% IV SOLN
INTRAVENOUS | Status: AC
  Administered 2017-08-11 (×2): via INTRAVENOUS
  Filled 2017-08-11 (×3): qty 1000

## 2017-08-11 MED ORDER — PANTOPRAZOLE SODIUM 40 MG PO TBEC
40.0000 mg | DELAYED_RELEASE_TABLET | Freq: Every day | ORAL | Status: DC
  Administered 2017-08-11 – 2017-08-12 (×2): 40 mg via ORAL
  Filled 2017-08-11 (×2): qty 1

## 2017-08-11 MED ORDER — ACETAMINOPHEN 325 MG PO TABS
650.0000 mg | ORAL_TABLET | Freq: Four times a day (QID) | ORAL | Status: DC | PRN
  Administered 2017-08-11 (×2): 650 mg via ORAL
  Filled 2017-08-11 (×2): qty 2

## 2017-08-11 NOTE — ED Notes (Signed)
Pt took home dose of Amitriptyline 150 mg per EDP okay. Pt husband gave from home supply.

## 2017-08-11 NOTE — ED Notes (Addendum)
Pt placed on cardiac monitor - SR with BBB @ a rate of 90. Pharmacist at [facility] states okay to start NS with 20 KCL @ 125 ml/hr and administer at the same time as KCL 17meq runs x4.

## 2017-08-11 NOTE — Consult Note (Signed)
Reason for Consult: SBO Referring Physician: Dr. Paula Compton is an 75 y.o. female.  HPI: Patient is a 76 year old female with a past surgical history 7 for open cholecystectomy and lap appendectomy. Patient comes in today with a 3 week history of constipation and diarrhea. She states that recently over the last week or so she has noticed increasing diarrhea and some abdominal pain. Patient states that for her constipation and she was taking MiraLAX, and this appeared to be working to well so she took Imodium. She states she recently took Imodium to help with the diarrhea has been helpful. She states that she's had no bloating.  Upon evaluation in the ER she underwent CT scan to evaluate for abdominal pain. This showed dilated loops of small bowel and fluid filled loops of small bowel and fluid filled colon throughout. Patient states her last bowel movement was last night.  Past Medical History:  Diagnosis Date  . ALKALINE PHOSPHATASE, ELEVATED 03/15/2009  . Allergic state 06/10/2012  . Anemia   . Anxiety   . Anxiety and depression 04/28/2011  . Arthritis   . Atypical chest pain 11/30/2011  . 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 Baker's cyst of knee 05/22/2011  . 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 Carotid artery disease ([place])    a. Carotid duplex 03/2014: stable 1-39% BICA, f/u due 03/2016.  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    [name] COPD (chronic obstructive pulmonary disease) ([place])   . Dermatitis 11/23/2012  . Dysphagia   . EE (eosinophilic esophagitis)   . Elevated sed rate 08/02/2013  . Esophageal ring   . ESOPHAGEAL STRICTURE 03/29/2009  . Fall 11/23/2012  . Family history of breast cancer   . Family history of colon cancer   . Family history of ovarian cancer   . Family history of pancreatic cancer   . Folliculitis of nose 3/82/5053  . GERD  (gastroesophageal reflux disease) 09/29/2009   improved s/p cholecystectomy and esophagus dilatation  . Hematuria   . History of chicken pox   . History of measles   . History of shingles    2 episodes  . Hx of echocardiogram    a. Echo 01/2013: mild LVH, EF 55-60%, normal wall motion, Gr 1 diast dysfn  . Hyperlipidemia   . Knee pain, bilateral 07/23/2011  . Medicare annual wellness visit, subsequent 06/19/2015  . Mixed hyperlipidemia 10/17/2010   Qualifier: Diagnosis of  By: Mack Guise    . Orthostasis   . Osteopenia 03/14/2011  . Personal history of chemotherapy 2001  . Personal history of radiation therapy 2001   rt breast  . 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 Pneumonia 2-10   pleurisy  . PVC's (premature ventricular contractions)    a. Event monitor 01/2013: NSR, extensive PVCs.  . Radial neck fracture 10/2011   minimally displaced  . Sinusitis, acute 12/04/2015  . Trauma 10/18/2011  . URI (upper respiratory infection) 09/02/2012  . Urinary frequency 12/12/2014  . Urinary incontinence 03/19/2012  . Vaginitis 05/22/2011    Past Surgical History:  Procedure Laterality Date  . ANTERIOR CRUCIATE LIGAMENT REPAIR  08, 09, 10  . AUGMENTATION MAMMAPLASTY Right 05/27/2007  . BREAST BIOPSY Right 01/02/2007   wire loc  . BREAST BIOPSY  12/26/2006  . BREAST LUMPECTOMY Right 2001  . BREAST RECONSTRUCTION  2008, 2009, 2010  . BREAST REDUCTION WITH MASTOPEXY Left 05/30/2017   Procedure: LEFT BREAST REDUCTION FOR SYMTERY WITH MASTOPEXY;  Surgeon: Wallace Going, DO;  Location: [facility];  Service: Plastics;  Laterality: Left;  . CHOLECYSTECTOMY  2010  . COLONOSCOPY  09/05/10   cecal avm's  . DENTAL SURGERY  05/2016   4 dental implants by Dr. Loyal Gambler.  [name] ERCP  2010    CBD stone extraction   . ESOPHAGOGASTRODUODENOSCOPY  01/08/2012   Procedure: ESOPHAGOGASTRODUODENOSCOPY (EGD);  Surgeon: Gatha Mayer, MD;  Location: [facility];  Service: Endoscopy;   Laterality: N/A;  . ESOPHAGOGASTRODUODENOSCOPY (EGD) WITH ESOPHAGEAL DILATION  2010, 2012  . LAPAROSCOPIC APPENDECTOMY N/A 08/04/2016   Procedure: APPENDECTOMY LAPAROSCOPIC;  Surgeon: Michael Boston, MD;  Location: [facility];  Service: General;  Laterality: N/A;  . MASTECTOMY MODIFIED RADICAL Right 05/27/2007   , Mastectomy modified radical (08), breast reconstruction, CA lesions excised lateral abd wall 2010  . SAVORY DILATION  01/08/2012   Procedure: SAVORY DILATION;  Surgeon: Gatha Mayer, MD;  Location: [facility];  Service: Endoscopy;  Laterality: N/A;  need xray    Family History  Problem Relation Age of Onset  . Heart disease Father   . Lung cancer Father        smoker  . Cirrhosis Sister        Primary Biliary  . Stroke Maternal Grandmother   . Alcohol abuse Maternal Grandfather   . Heart disease Paternal Grandfather   . Anxiety disorder Sister   . Osteoporosis Sister   . Arthritis Sister        Rheumatoid  . Osteoporosis Sister   . Skin cancer Sister        multiple skin cancers, over 36 excisions.  . Other Mother        tic [place]  . Breast cancer Maternal Aunt        dx in her 85s  . Breast cancer Paternal Aunt        dx in her 6s  . Pancreatic cancer Maternal Uncle        dx in his 13s; smoker  . Breast cancer Maternal Aunt        dx in her 66s  . Breast cancer Maternal Aunt        possible breast cancer dx and died in her 9s  . Ovarian cancer Maternal Aunt 29  . Pancreatic cancer Maternal Aunt        dx in her 61s  . Stomach cancer Maternal Uncle   . Colon cancer Maternal Uncle   . Lung cancer Paternal Aunt   . Breast cancer Cousin        paternal first cousin  . Breast cancer Cousin        maternal first cousin  . Anesthesia problems Neg Hx   . Hypotension Neg Hx   . Malignant hyperthermia Neg Hx   . Pseudochol deficiency Neg Hx     Social History:  reports that she quit smoking about 10 years ago. Her smoking use included Cigarettes. She has a  50.00 pack-year smoking history. She has never used smokeless tobacco. She reports that she drinks alcohol. She reports that she does not use drugs.  Allergies:  Allergies  Allergen Reactions  . Codeine Other (See Comments)    "flu like symptoms"  . Morphine Other (See Comments)    "flu like symptoms"  . Peanut-Containing Drug Products Hives  . Sorbitol Other (See  Comments)    GI Issues  . Tomato Diarrhea  . Zofran Other (See Comments)    headache  . Advil [Ibuprofen] Other (See Comments)    Irritates throat.  . Diphenhydramine Hcl Other (See Comments)    "nervous and upset"  . Oxycodone Anxiety    Confusion with inability to think clearly    Medications: I have reviewed the patient's current medications.  Results for orders placed or performed during the hospital encounter of 08/10/17 (from the past 48 hour(s))  Comprehensive metabolic panel     Status: Abnormal   Collection Time: 08/10/17  9:20 PM  Result Value Ref Range   Sodium 137 135 - 145 mmol/L   Potassium 3.1 (L) 3.5 - 5.1 mmol/L    Comment: SLIGHT HEMOLYSIS RESULT CALLED TO, READ BACK BY AND VERIFIED WITH: MAYNARD,C AT 2150 ON [id number] BY CHERESNOWSKY,T    Chloride 102 101 - 111 mmol/L   CO2 24 22 - 32 mmol/L   Glucose, Bld 100 (H) 65 - 99 mg/dL   BUN 18 6 - 20 mg/dL   Creatinine, Ser 0.64 0.44 - 1.00 mg/dL   Calcium 8.6 (L) 8.9 - 10.3 mg/dL   Total Protein 6.8 6.5 - 8.1 g/dL   Albumin 3.5 3.5 - 5.0 g/dL   AST 30 15 - 41 U/L   ALT 27 14 - 54 U/L   Alkaline Phosphatase 130 (H) 38 - 126 U/L   Total Bilirubin 0.5 0.3 - 1.2 mg/dL   GFR calc non Af Amer >60 >60 mL/min   GFR calc Af Amer >60 >60 mL/min    Comment: (NOTE) The eGFR has been calculated using the CKD EPI equation. This calculation has not been validated in all clinical situations. eGFR's persistently <60 mL/min signify possible Chronic Kidney Disease.    Anion gap 11 5 - 15  Lipase, blood     Status: None   Collection Time: 08/10/17  9:20 PM   Result Value Ref Range   Lipase 24 11 - 51 U/L  CBC with Diff     Status: Abnormal   Collection Time: 08/10/17  9:20 PM  Result Value Ref Range   WBC 5.8 4.0 - 10.5 K/uL   RBC 3.83 (L) 3.87 - 5.11 MIL/uL   Hemoglobin 11.9 (L) 12.0 - 15.0 g/dL   HCT 36.5 36.0 - 46.0 %   MCV 95.3 78.0 - 100.0 fL   MCH 31.1 26.0 - 34.0 pg   MCHC 32.6 30.0 - 36.0 g/dL   RDW 14.3 11.5 - 15.5 %   Platelets 239 150 - 400 K/uL   Neutrophils Relative % 65 %   Neutro Abs 3.7 1.7 - 7.7 K/uL   Lymphocytes Relative 22 %   Lymphs Abs 1.3 0.7 - 4.0 K/uL   Monocytes Relative 12 %   Monocytes Absolute 0.7 0.1 - 1.0 K/uL   Eosinophils Relative 1 %   Eosinophils Absolute 0.1 0.0 - 0.7 K/uL   Basophils Relative 0 %   Basophils Absolute 0.0 0.0 - 0.1 K/uL  Magnesium     Status: Abnormal   Collection Time: 08/10/17  9:20 PM  Result Value Ref Range   Magnesium 1.5 (L) 1.7 - 2.4 mg/dL  Urinalysis, Routine w reflex microscopic     Status: Abnormal   Collection Time: 08/11/17  7:19 AM  Result Value Ref Range   Color, Urine STRAW (A) YELLOW   APPearance CLEAR CLEAR   Specific Gravity, Urine 1.030 1.005 - 1.030   pH 5.0  5.0 - 8.0   Glucose, UA NEGATIVE NEGATIVE mg/dL   Hgb urine dipstick NEGATIVE NEGATIVE   Bilirubin Urine NEGATIVE NEGATIVE   Ketones, ur NEGATIVE NEGATIVE mg/dL   Protein, ur NEGATIVE NEGATIVE mg/dL   Nitrite NEGATIVE NEGATIVE   Leukocytes, UA NEGATIVE NEGATIVE   *Note: Due to a large number of results and/or encounters for the requested time period, some results have not been displayed. A complete set of results can be found in Results Review.    Dg Chest 2 View  Result Date: 08/11/2017 CLINICAL DATA:  Hypoxia. EXAM: CHEST  2 VIEW COMPARISON:  In 04/2028 17 FINDINGS: Heart size is normal. The lungs are free of focal consolidations and pleural effusions. No pulmonary edema. Status post right mastectomy. Surgical clips are identified in the right axillary region. Surgical clips are present in the  right upper quadrant the abdomen. IMPRESSION: No evidence for acute cardiopulmonary abnormality. Electronically Signed   By: Nolon Nations M.D.   On: 08/11/2017 11:35   Ct Abdomen Pelvis W Contrast  Result Date: 08/10/2017 CLINICAL DATA:  Diarrhea for 6 days. EXAM: CT ABDOMEN AND PELVIS WITH CONTRAST TECHNIQUE: Multidetector CT imaging of the abdomen and pelvis was performed using the standard protocol following bolus administration of intravenous contrast. CONTRAST:  192m ISOVUE-300 IOPAMIDOL (ISOVUE-300) INJECTION 61% COMPARISON:  08/03/2016 FINDINGS: Lower chest: No acute abnormality. Hepatobiliary: No focal liver abnormality. Previous cholecystectomy. Mild intrahepatic biliary dilatation. Similar to previous exam. Chronic increase caliber of the CBD which measures 9 mm, image 30 of series 2. Pancreas: Unremarkable. No pancreatic ductal dilatation or surrounding inflammatory changes. Spleen: Calcified granulomas identified within the spleen. Adrenals/Urinary Tract: The adrenal glands are normal. Unremarkable appearance of both kidneys. No mass or hydronephrosis. The urinary bladder appears normal. Stomach/Bowel: The stomach appears unremarkable. The proximal and mid small bowel loops are increased in caliber measuring up to 3 cm. Transition to decreased caliber distal small bowel loops noted within the right lower quadrant of the abdomen. Unremarkable appearance of the colon. Vascular/Lymphatic: Aortic atherosclerosis. No aneurysm. Portacaval node measures 8 mm. No adenopathy identified within the abdomen or pelvis. No inguinal adenopathy. Reproductive: Uterus and bilateral adnexa are unremarkable. Other: No free fluid or fluid collections within the abdomen or pelvis. Musculoskeletal: No acute or significant osseous findings. IMPRESSION: 1. Increase caliber of the proximal and mid small bowel loops measuring up to 3 cm and suspicious for small bowel obstruction. There is a transition to decreased caliber  distal small bowel loops within the right lower quadrant of the abdomen and pelvis. 2. No significant free fluid or fluid collections identified. 3. Status post cholecystectomy with chronic increase caliber of the common bile duct and intrahepatic ducts. 4.  Aortic Atherosclerosis (ICD10-I70.0). Electronically Signed   By: TKerby MoorsM.D.   On: 08/10/2017 22:18    Review of Systems  Constitutional: Negative for chills, fever and malaise/fatigue.  HENT: Negative for ear discharge, hearing loss and sore throat.   Eyes: Negative for blurred vision and discharge.  Respiratory: Negative for cough and shortness of breath.   Cardiovascular: Negative for chest pain, orthopnea and leg swelling.  Gastrointestinal: Positive for constipation and diarrhea. Negative for abdominal pain, heartburn, nausea and vomiting.  Musculoskeletal: Negative for myalgias and neck pain.  Skin: Negative for itching and rash.  Neurological: Negative for dizziness, focal weakness, seizures and loss of consciousness.  Endo/Heme/Allergies: Negative for environmental allergies. Does not bruise/bleed easily.  Psychiatric/Behavioral: Negative for depression and suicidal ideas.  All other systems reviewed  and are negative.  Blood pressure (!) 104/58, pulse 88, temperature 98 F (36.7 C), temperature source Oral, resp. rate 18, height 5' 5"  (1.651 m), weight 59 kg (130 lb), SpO2 96 %. Physical Exam  Constitutional: She is oriented to person, place, and time. Vital signs are normal. She appears well-developed and well-nourished.  Conversant No acute distress  Eyes: Lids are normal. No scleral icterus.  No lid lag Moist conjunctiva  Neck: No tracheal tenderness present. No thyromegaly present.  No cervical lymphadenopathy  Cardiovascular: Normal rate, regular rhythm and intact distal pulses.   No murmur heard. Respiratory: Effort normal and breath sounds normal. She has no wheezes. She has no rales.  GI: Soft. Bowel sounds  are normal. She exhibits no distension and no mass. There is no hepatosplenomegaly. There is no tenderness. There is no rebound and no guarding. No hernia.  Neurological: She is alert and oriented to person, place, and time.  Normal gait and station  Skin: Skin is warm. No rash noted. No cyanosis. Nails show no clubbing.  Normal skin turgor  Psychiatric: Judgment normal.  Appropriate affect    Assessment/Plan: 76 year old female with likely enteritis diarrhea. 1. At this time there is no SBO present. Patient is having on and off diarrhea and constipation. At this time I would recommend she DC the Imodium and MiraLAX and allow her electrolytes to balance out. Continue with rehydration. 2. There is no surgical plans at this time 3. Please call us back if we can assist.  Rosario Jacks., Anne Hahn 08/11/2017, 2:17 PM

## 2017-08-11 NOTE — H&P (Signed)
TRH H&P   Patient Demographics:    Patrizia Paule, is a 76 y.o. female  MRN: [id number]   DOB - [birth year]  Admit Date - 08/10/2017  Outpatient Primary MD for the patient is [name], MD  Referring MD/NP/PA: Carmin Muskrat  Outpatient Specialists:  [name] (gastroenterology) Donne Hazel (surgery) Gross (surgery)  Patient coming from: home  Chief Complaint  Patient presents with  . Abdominal Pain  . Diarrhea      HPI:    Rane Dumm  is a 76 y.o. 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  Pt denies fever, chills, n/v, constipation, brbpr, black stool.  Pt has been taking immodium and prior to that metamuscil.   In the ED,  Wbc 5.8, Hgb 11.9, K 3.1,  Pt given Kcl 51meq iv x4 .  CT scan abd / pelvis, => IMPRESSION: 1. Increase caliber of the proximal and mid small bowel loops measuring up to 3 cm and suspicious for small bowel obstruction. There is a transition to decreased caliber distal small bowel loops within the right lower quadrant of the abdomen and pelvis. 2. No significant free fluid or fluid collections identified. 3. Status post cholecystectomy with chronic increase caliber of the common bile duct and intrahepatic ducts. 4.  Aortic Atherosclerosis (ICD10-I70.0). pt admitted for ? SBO.      Review of systems:    In addition to the HPI above No Fever-chills, No Headache, No changes with Vision or hearing, No problems swallowing food or Liquids, No Chest pain, Cough or Shortness of Breath, No Nausea or Vommitting No Blood in stool or Urine, No dysuria, No new skin rashes or bruises, No new joints pains-aches,  No new weakness, tingling, numbness in any extremity, No recent weight gain or loss, No  polyuria, polydypsia or polyphagia, No significant Mental Stressors.  A full 10 point Review of Systems was done, except as stated above, all other Review of Systems were negative.   With Past History of the following :    Past Medical History:  Diagnosis Date  . ALKALINE PHOSPHATASE, ELEVATED 03/15/2009  . Allergic state 06/10/2012  . Anemia   . Anxiety   . Anxiety and depression 04/28/2011  . Arthritis   . Atypical chest pain 11/30/2011  . 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 Baker's cyst of knee 05/22/2011  . 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 Carotid artery disease ([place])    a. Carotid duplex 03/2014: stable 1-39% BICA, f/u due 03/2016.  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    [name] COPD (chronic obstructive pulmonary disease) ([place])   . Dermatitis 11/23/2012  .  Dysphagia   . EE (eosinophilic esophagitis)   . Elevated sed rate 08/02/2013  . Esophageal ring   . ESOPHAGEAL STRICTURE 03/29/2009  . Fall 11/23/2012  . Family history of breast cancer   . Family history of colon cancer   . Family history of ovarian cancer   . Family history of pancreatic cancer   . Folliculitis of nose 8/33/8250  . GERD (gastroesophageal reflux disease) 09/29/2009   improved s/p cholecystectomy and esophagus dilatation  . Hematuria   . History of chicken pox   . History of measles   . History of shingles    2 episodes  . Hx of echocardiogram    a. Echo 01/2013: mild LVH, EF 55-60%, normal wall motion, Gr 1 diast dysfn  . Hyperlipidemia   . Knee pain, bilateral 07/23/2011  . Medicare annual wellness visit, subsequent 06/19/2015  . Mixed hyperlipidemia 10/17/2010   Qualifier: Diagnosis of  By: Mack Guise    . Orthostasis   . Osteopenia 03/14/2011  . Personal history of chemotherapy 2001  . Personal history of radiation therapy 2001   rt breast  . PERSONAL HX BREAST CANCER  09/29/2009  . Pneumonia 2-10   pleurisy  . PVC's (premature ventricular contractions)    a. Event monitor 01/2013: NSR, extensive PVCs.  . Radial neck fracture 10/2011   minimally displaced  . Sinusitis, acute 12/04/2015  . Trauma 10/18/2011  . URI (upper respiratory infection) 09/02/2012  . Urinary frequency 12/12/2014  . Urinary incontinence 03/19/2012  . Vaginitis 05/22/2011      Past Surgical History:  Procedure Laterality Date  . ANTERIOR CRUCIATE LIGAMENT REPAIR  08, 09, 10  . AUGMENTATION MAMMAPLASTY Right 05/27/2007  . BREAST BIOPSY Right 01/02/2007   wire loc  . BREAST BIOPSY  12/26/2006  . BREAST LUMPECTOMY Right 2001  . BREAST RECONSTRUCTION  2008, 2009, 2010  . BREAST REDUCTION WITH MASTOPEXY Left 05/30/2017   Procedure: LEFT BREAST REDUCTION FOR SYMTERY WITH MASTOPEXY;  Surgeon: Wallace Going, DO;  Location: [facility];  Service: Plastics;  Laterality: Left;  . CHOLECYSTECTOMY  2010  . COLONOSCOPY  09/05/10   cecal avm's  . DENTAL SURGERY  05/2016   4 dental implants by Dr. Loyal Gambler.  [name] ERCP  2010    CBD stone extraction   . ESOPHAGOGASTRODUODENOSCOPY  01/08/2012   Procedure: ESOPHAGOGASTRODUODENOSCOPY (EGD);  Surgeon: Gatha Mayer, MD;  Location: [facility];  Service: Endoscopy;  Laterality: N/A;  . ESOPHAGOGASTRODUODENOSCOPY (EGD) WITH ESOPHAGEAL DILATION  2010, 2012  . LAPAROSCOPIC APPENDECTOMY N/A 08/04/2016   Procedure: APPENDECTOMY LAPAROSCOPIC;  Surgeon: Michael Boston, MD;  Location: [facility];  Service: General;  Laterality: N/A;  . MASTECTOMY MODIFIED RADICAL Right 05/27/2007   , Mastectomy modified radical (08), breast reconstruction, CA lesions excised lateral abd wall 2010  . SAVORY DILATION  01/08/2012   Procedure: SAVORY DILATION;  Surgeon: Gatha Mayer, MD;  Location: [facility];  Service: Endoscopy;  Laterality: N/A;  need xray      Social History:     Social History  Substance Use Topics  . Smoking status: Former Smoker     Packs/day: 1.00    Years: 50.00    Types: Cigarettes    Quit date: 05/22/2007  . Smokeless tobacco: Never Used  . Alcohol use Yes     Comment: social     Lives - at home  Mobility - walks by self   Family History :     Family History  Problem  Relation Age of Onset  . Heart disease Father   . Lung cancer Father        smoker  . Cirrhosis Sister        Primary Biliary  . Stroke Maternal Grandmother   . Alcohol abuse Maternal Grandfather   . Heart disease Paternal Grandfather   . Anxiety disorder Sister   . Osteoporosis Sister   . Arthritis Sister        Rheumatoid  . Osteoporosis Sister   . Skin cancer Sister        multiple skin cancers, over 75 excisions.  . Other Mother        tic [place]  . Breast cancer Maternal Aunt        dx in her 61s  . Breast cancer Paternal Aunt        dx in her 82s  . Pancreatic cancer Maternal Uncle        dx in his 78s; smoker  . Breast cancer Maternal Aunt        dx in her 36s  . Breast cancer Maternal Aunt        possible breast cancer dx and died in her 36s  . Ovarian cancer Maternal Aunt 29  . Pancreatic cancer Maternal Aunt        dx in her 33s  . Stomach cancer Maternal Uncle   . Colon cancer Maternal Uncle   . Lung cancer Paternal Aunt   . Breast cancer Cousin        paternal first cousin  . Breast cancer Cousin        maternal first cousin  . Anesthesia problems Neg Hx   . Hypotension Neg Hx   . Malignant hyperthermia Neg Hx   . Pseudochol deficiency Neg Hx       Home Medications:   Prior to Admission medications   Medication Sig Start Date End Date Taking? Authorizing Provider  acetaminophen (TYLENOL) 500 MG tablet Take 500-1,000 mg by mouth every 6 (six) hours as needed (for pain.).    [provider]  acyclovir (ZOVIRAX) 400 MG tablet Take 2 tablets (800 mg total) by mouth 5 (five) times daily. 08/06/17   [name], MD  amitriptyline (ELAVIL) 150 MG tablet Take 1 tablet (150 mg total) by mouth  at bedtime. 03/11/17   [name], MD  aspirin EC 81 MG tablet Take 81 mg by mouth daily.    [provider]  atorvastatin (LIPITOR) 40 MG tablet TAKE 1 TABLET EVERY DAY 06/20/17   [name], MD  Cholecalciferol (VITAMIN D3) 1000 UNITS CAPS Take 2 tablets by mouth daily.      [provider]  Cyanocobalamin (VITAMIN B 12 PO) Take 1 tablet by mouth daily.    [provider]  diltiazem (CARDIZEM) 30 MG tablet take 1 tablet by mouth q am FOR PALPITATIONS 08/05/17   [name], MD  ferrous sulfate 325 (65 FE) MG tablet Take 325 mg by mouth daily with breakfast.    [provider]  LORazepam (ATIVAN) 1 MG tablet Take 1 tablet (1 mg total) by mouth every 8 (eight) hours as needed for anxiety. 03/11/17   [name], MD  NONFORMULARY OR COMPOUNDED ITEM Budesonide Slurry  - 2mg /10ml   - Take 2mg  daily x 30 days with 2 refills Wait 15-30 minutes before drinking or eating after taking 05/27/17   Gatha Mayer, MD  pantoprazole (PROTONIX) 40 MG tablet Take 1  tablet (40 mg total) by mouth daily. 08/08/17   [name], MD  Probiotic Product (PROBIOTIC FORMULA PO) Take 1 capsule by mouth daily.    [provider]  triamcinolone cream (KENALOG) 0.1 % Apply 1 application topically 2 (two) times daily. 03/11/17   [name], MD     Allergies:     Allergies  Allergen Reactions  . Codeine Other (See Comments)    "flu like symptoms"  . Morphine Other (See Comments)    "flu like symptoms"  . Peanut-Containing Drug Products Hives  . Sorbitol Other (See Comments)    GI Issues  . Tomato Diarrhea  . Zofran Other (See Comments)    headache  . Advil [Ibuprofen] Other (See Comments)    Irritates throat.  . Diphenhydramine Hcl Other (See Comments)    "nervous and upset"     Physical Exam:   Vitals  Blood pressure (!) 104/58, pulse 88, temperature 98 F (36.7 C), temperature source Oral, resp. rate 18, height 5\' 5"  (1.651 m), weight 59  kg (130 lb), SpO2 96 %.   1. General  lying in bed in NAD,   2. Normal affect and insight, Not Suicidal or Homicidal, Awake Alert, Oriented X 3.  3. No F.N deficits, ALL C.Nerves Intact, Strength 5/5 all 4 extremities, Sensation intact all 4 extremities, Plantars down going.  4. Ears and Eyes appear Normal, Conjunctivae clear, PERRLA. Moist Oral Mucosa.  5. Supple Neck, No JVD, No cervical lymphadenopathy appriciated, No Carotid Bruits.  6. Symmetrical Chest wall movement, Good air movement bilaterally, CTAB.  7. RRR, No Gallops, Rubs or Murmurs, No Parasternal Heave.  8. Slight distention,  Positive Bowel Sounds, Abdomen Soft, No tenderness, No organomegaly appriciated,No rebound -guarding or rigidity.  9.  No Cyanosis, Normal Skin Turgor, No Skin Rash or Bruise.  10. Good muscle tone,  joints appear normal , no effusions, Normal ROM.  11. No Palpable Lymph Nodes in Neck or Axillae     Data Review:    CBC  Recent Labs Lab 08/05/17 1538 08/10/17 2120  WBC 7.9 5.8  HGB 12.9 11.9*  HCT 40.0 36.5  PLT 254.0 239  MCV 96.1 95.3  MCH  --  31.1  MCHC 32.2 32.6  RDW 16.1* 14.3  LYMPHSABS 1.2 1.3  MONOABS 0.5 0.7  EOSABS 0.2 0.1  BASOSABS 0.0 0.0   ------------------------------------------------------------------------------------------------------------------  Chemistries   Recent Labs Lab 08/05/17 1538 08/10/17 2120  NA 138 137  K 4.0 3.1*  CL 102 102  CO2 30 24  GLUCOSE 97 100*  BUN 17 18  CREATININE 0.69 0.64  CALCIUM 9.1 8.6*  MG 1.8 1.5*  AST 23 30  ALT 16 27  ALKPHOS 99 130*  BILITOT 0.6 0.5   ------------------------------------------------------------------------------------------------------------------ estimated creatinine clearance is 55.5 mL/min (by C-G formula based on SCr of 0.64 mg/dL). ------------------------------------------------------------------------------------------------------------------ No results for input(s): TSH,  T4TOTAL, T3FREE, THYROIDAB in the last 72 hours.  Invalid input(s): FREET3  Coagulation profile No results for input(s): INR, PROTIME in the last 168 hours. ------------------------------------------------------------------------------------------------------------------- No results for input(s): DDIMER in the last 72 hours. -------------------------------------------------------------------------------------------------------------------  Cardiac Enzymes No results for input(s): CKMB, TROPONINI, MYOGLOBIN in the last 168 hours.  Invalid input(s): CK ------------------------------------------------------------------------------------------------------------------ No results found for: BNP   ---------------------------------------------------------------------------------------------------------------  Urinalysis    Component Value Date/Time   COLORURINE YELLOW 03/11/2017 [place] 03/11/2017 1242   LABSPEC 1.015 03/11/2017 1242   PHURINE 7.5 03/11/2017 1242   GLUCOSEU NEGATIVE 03/11/2017  [place] 03/11/2017 [place] 03/11/2017 1242   BILIRUBINUR neg 06/22/2013 [place] 03/11/2017 1242   PROTEINUR neg 06/22/2013 1610   PROTEINUR NEGATIVE 05/20/2013 1535   UROBILINOGEN 0.2 03/11/2017 1242   NITRITE NEGATIVE 03/11/2017 1242   LEUKOCYTESUR TRACE (A) 03/11/2017 1242    ----------------------------------------------------------------------------------------------------------------   Imaging Results:    Ct Abdomen Pelvis W Contrast  Result Date: 08/10/2017 CLINICAL DATA:  Diarrhea for 6 days. EXAM: CT ABDOMEN AND PELVIS WITH CONTRAST TECHNIQUE: Multidetector CT imaging of the abdomen and pelvis was performed using the standard protocol following bolus administration of intravenous contrast. CONTRAST:  168mL ISOVUE-300 IOPAMIDOL (ISOVUE-300) INJECTION 61% COMPARISON:  08/03/2016 FINDINGS: Lower chest: No acute  abnormality. Hepatobiliary: No focal liver abnormality. Previous cholecystectomy. Mild intrahepatic biliary dilatation. Similar to previous exam. Chronic increase caliber of the CBD which measures 9 mm, image 30 of series 2. Pancreas: Unremarkable. No pancreatic ductal dilatation or surrounding inflammatory changes. Spleen: Calcified granulomas identified within the spleen. Adrenals/Urinary Tract: The adrenal glands are normal. Unremarkable appearance of both kidneys. No mass or hydronephrosis. The urinary bladder appears normal. Stomach/Bowel: The stomach appears unremarkable. The proximal and mid small bowel loops are increased in caliber measuring up to 3 cm. Transition to decreased caliber distal small bowel loops noted within the right lower quadrant of the abdomen. Unremarkable appearance of the colon. Vascular/Lymphatic: Aortic atherosclerosis. No aneurysm. Portacaval node measures 8 mm. No adenopathy identified within the abdomen or pelvis. No inguinal adenopathy. Reproductive: Uterus and bilateral adnexa are unremarkable. Other: No free fluid or fluid collections within the abdomen or pelvis. Musculoskeletal: No acute or significant osseous findings. IMPRESSION: 1. Increase caliber of the proximal and mid small bowel loops measuring up to 3 cm and suspicious for small bowel obstruction. There is a transition to decreased caliber distal small bowel loops within the right lower quadrant of the abdomen and pelvis. 2. No significant free fluid or fluid collections identified. 3. Status post cholecystectomy with chronic increase caliber of the common bile duct and intrahepatic ducts. 4.  Aortic Atherosclerosis (ICD10-I70.0). Electronically Signed   By: Kerby Moors M.D.   On: 08/10/2017 22:18       Assessment & Plan:    Principal Problem:   SBO (small bowel obstruction) (HCC) Active Problems:   Anemia   Hypokalemia    SBO NPO Ns iv NGT to low intermittent suction Appreciate surgery input.    Hypokalemia Replete Check cmp   Anemia Check cbc in am  DVT Prophylaxis Lovenox - SCDs   AM Labs Ordered, also please review Full Orders  Family Communication: Admission, patients condition and plan of care including tests being ordered have been discussed with the patient  who indicate understanding and agree with the plan and Code Status.  Code Status FULL CODE  Likely DC to  home  Condition GUARDED    Consults called: surgery by ED, please reconsult in am  Admission status: inpatient  Time spent in minutes : 45   Jani Gravel M.D on 08/11/2017 at 5:24 AM  Between 7am to 7pm - Pager - [phone]. After 7pm go to [email or web address] - password [facility]  [facility] - Office  [phone]

## 2017-08-11 NOTE — Progress Notes (Signed)
Patient ID: Anne [name], female   DOB: January 12, 1941, 76 y.o.   MRN: [id number] Patient was admitted early this morning for probable small bowel obstruction. Patient refused NG tube. I reviewed the medical records and history and physical from this morning. I have seen and examined the patient at bedside and discussed the plan of care. I have called general surgery consult. We will wait for the recommendations. Continue IV fluids, nothing by mouth. Repeat a.m. labs.

## 2017-08-11 NOTE — Progress Notes (Signed)
Pt refused NGT, Dr Maudie Mercury aware.

## 2017-08-11 NOTE — Assessment & Plan Note (Signed)
Labs ordered and unremarkable. Diarrhea was sudden onset. Possible gastroenteritis, encouraged clear fluids and bland diet and report worsening symptoms.

## 2017-08-11 NOTE — ED Notes (Signed)
Pt unable to provide UA.

## 2017-08-12 ENCOUNTER — Inpatient Hospital Stay (HOSPITAL_COMMUNITY): Payer: Medicare HMO

## 2017-08-12 DIAGNOSIS — D649 Anemia, unspecified: Secondary | ICD-10-CM

## 2017-08-12 DIAGNOSIS — R197 Diarrhea, unspecified: Secondary | ICD-10-CM

## 2017-08-12 DIAGNOSIS — E876 Hypokalemia: Secondary | ICD-10-CM

## 2017-08-12 LAB — COMPREHENSIVE METABOLIC PANEL
ALBUMIN: 2.7 g/dL — AB (ref 3.5–5.0)
ALT: 22 U/L (ref 14–54)
ANION GAP: 9 (ref 5–15)
AST: 23 U/L (ref 15–41)
Alkaline Phosphatase: 93 U/L (ref 38–126)
BUN: 7 mg/dL (ref 6–20)
CO2: 18 mmol/L — AB (ref 22–32)
Calcium: 7.9 mg/dL — ABNORMAL LOW (ref 8.9–10.3)
Chloride: 114 mmol/L — ABNORMAL HIGH (ref 101–111)
Creatinine, Ser: 0.63 mg/dL (ref 0.44–1.00)
GFR calc Af Amer: >60 mL/min (ref >60)
GFR calc non Af Amer: >60 mL/min (ref >60)
GLUCOSE: 61 mg/dL — AB (ref 65–99)
POTASSIUM: 3.8 mmol/L (ref 3.5–5.1)
SODIUM: 141 mmol/L (ref 135–145)
TOTAL PROTEIN: 5.2 g/dL — AB (ref 6.5–8.1)
Total Bilirubin: 0.8 mg/dL (ref 0.3–1.2)

## 2017-08-12 LAB — CBC
HEMATOCRIT: 32.2 % — AB (ref 36.0–46.0)
HEMOGLOBIN: 10.1 g/dL — AB (ref 12.0–15.0)
MCH: 30 pg (ref 26.0–34.0)
MCHC: 31.4 g/dL (ref 30.0–36.0)
MCV: 95.5 fL (ref 78.0–100.0)
Platelets: 219 10*3/uL (ref 150–400)
RBC: 3.37 MIL/uL — ABNORMAL LOW (ref 3.87–5.11)
RDW: 14.4 % (ref 11.5–15.5)
WBC: 7.1 10*3/uL (ref 4.0–10.5)

## 2017-08-12 LAB — PROTIME-INR
INR: 1.02
Prothrombin Time: 13.3 seconds (ref 11.4–15.2)

## 2017-08-12 LAB — APTT: APTT: 30 s (ref 24–36)

## 2017-08-12 MED ORDER — TRIAMCINOLONE ACETONIDE 0.1 % EX CREA: 1.0000 "application " | TOPICAL_CREAM | CUTANEOUS | Status: DC | PRN

## 2017-08-12 MED ORDER — DILTIAZEM HCL 30 MG PO TABS: 30.0000 mg | ORAL_TABLET | Freq: Every day | ORAL | Status: DC

## 2017-08-12 MED ORDER — POTASSIUM CHLORIDE IN NACL 20-0.9 MEQ/L-% IV SOLN
INTRAVENOUS | Status: DC
  Administered 2017-08-12: 11:00:00 via INTRAVENOUS

## 2017-08-12 NOTE — Discharge Summary (Signed)
Physician Discharge Summary  Anne [name] YWV:[id number] DOB: [birth year] DOA: 08/10/2017  PCP: [name], MD  Admit date: 08/10/2017 Discharge date: 08/12/2017  Admitted From: home Disposition:  Home  Recommendations for Outpatient Follow-up:  1. Follow up with PCP in 1 week with BMP/CBC  2. Follow-up with Dr. Delrae Rend as scheduled   Home Health: no  Equipment/Devices: none  Discharge Condition: stable  CODE STATUS: full  Diet recommendation: Heart Healthy / soft diet  Brief/Interim Summary: 76 year old woman with history of eosinophilic esophagitis presented with abdominal pain and diarrhea. CT abdomen showed dilated small bowel loops suspicious for small bowel obstruction. General surgery was consulted. General surgery was of the opinion that patient did not have bowel obstruction. Patient started having bowel movements again. If she tolerates diet, she'll be discharged home. She has an outpatient appointment with Dr. Delrae Rend in 2-3 days' time.  Discharge Diagnoses:  Principal Problem:   SBO (small bowel obstruction) (HCC) Active Problems:   Anemia   Hypokalemia  Diarrhea with mild ileus - General surgery evaluation appreciated. Small bowel obstruction has been ruled out as per general surgery evaluation. Patient is having bowel movements again. Her x-ray of the abdomen this morning is negative for ileus. Her abdomen is soft. If she tolerates diet, she'll be discharged home today with outpatient follow-up with Dr. Delrae Rend in 2-3 days' time  Hypokalemia - Resolved   Discharge Instructions  Discharge Instructions    Call MD for:  difficulty breathing, headache or visual disturbances    Complete by:  As directed    Call MD for:  extreme fatigue    Complete by:  As directed    Call MD for:  hives    Complete by:  As directed    Call MD for:  persistant dizziness or light-headedness    Complete by:  As directed    Call MD for:  persistant nausea and  vomiting    Complete by:  As directed    Call MD for:  severe uncontrolled pain    Complete by:  As directed    Call MD for:  temperature >100.4    Complete by:  As directed    Diet - low sodium heart healthy    Complete by:  As directed    Discharge instructions    Complete by:  As directed    Soft diet till GI evaluation   Increase activity slowly    Complete by:  As directed      Allergies as of 08/12/2017      Reactions   Codeine Other (See Comments)   "flu like symptoms"   Morphine Other (See Comments)   "flu like symptoms"   Peanut-containing Drug Products Hives   Sorbitol Other (See Comments)   GI Issues   Tomato Diarrhea   Zofran Other (See Comments)   headache   Advil [ibuprofen] Other (See Comments)   Irritates throat.   Diphenhydramine Hcl Other (See Comments)   "nervous and upset"   Oxycodone Anxiety   Confusion with inability to think clearly      Medication List    TAKE these medications   acetaminophen 500 MG tablet Commonly known as:  TYLENOL Take 500-1,000 mg by mouth every 6 (six) hours as needed (for pain.).   acyclovir 400 MG tablet Commonly known as:  ZOVIRAX Take 2 tablets (800 mg total) by mouth 5 (five) times daily.   amitriptyline 150 MG tablet Commonly known as:  ELAVIL Take 1 tablet (150  mg total) by mouth at bedtime.   aspirin EC 81 MG tablet Take 81 mg by mouth daily.   atorvastatin 40 MG tablet Commonly known as:  LIPITOR TAKE 1 TABLET EVERY DAY What changed:  See the new instructions.   diltiazem 30 MG tablet Commonly known as:  CARDIZEM Take 1 tablet (30 mg total) by mouth daily. take 1 tablet by mouth q am FOR PALPITATIONS What changed:  how much to take  how to take this  when to take this   ferrous sulfate 325 (65 FE) MG tablet Take 325 mg by mouth daily with breakfast.   LORazepam 1 MG tablet Commonly known as:  ATIVAN Take 1 tablet (1 mg total) by mouth every 8 (eight) hours as needed for anxiety.    NONFORMULARY OR COMPOUNDED ITEM Budesonide Slurry  - 2mg /64ml   - Take 2mg  daily x 30 days with 2 refills Wait 15-30 minutes before drinking or eating after taking   pantoprazole 40 MG tablet Commonly known as:  PROTONIX Take 1 tablet (40 mg total) by mouth daily.   PROBIOTIC FORMULA PO Take 1 capsule by mouth daily.   triamcinolone cream 0.1 % Commonly known as:  KENALOG Apply 1 application topically as needed.   VITAMIN B 12 PO Take 1 tablet by mouth daily.   Vitamin D3 1000 units Caps Take 2,000 Units by mouth daily.       Allergies  Allergen Reactions  . Codeine Other (See Comments)    "flu like symptoms"  . Morphine Other (See Comments)    "flu like symptoms"  . Peanut-Containing Drug Products Hives  . Sorbitol Other (See Comments)    GI Issues  . Tomato Diarrhea  . Zofran Other (See Comments)    headache  . Advil [Ibuprofen] Other (See Comments)    Irritates throat.  . Diphenhydramine Hcl Other (See Comments)    "nervous and upset"  . Oxycodone Anxiety    Confusion with inability to think clearly    Consultations:  none   Procedures/Studies: Dg Chest 2 View  Result Date: 08/11/2017 CLINICAL DATA:  Hypoxia. EXAM: CHEST  2 VIEW COMPARISON:  In 04/2028 17 FINDINGS: Heart size is normal. The lungs are free of focal consolidations and pleural effusions. No pulmonary edema. Status post right mastectomy. Surgical clips are identified in the right axillary region. Surgical clips are present in the right upper quadrant the abdomen. IMPRESSION: No evidence for acute cardiopulmonary abnormality. Electronically Signed   By: Nolon Nations M.D.   On: 08/11/2017 11:35   Dg Sacrum/coccyx  Result Date: 08/06/2017 CLINICAL DATA:  Status post fall with pain and bruising over the sacrum and coccygeal region. Pain is greatest on the left. EXAM: SACRUM AND COCCYX - 2+ VIEW COMPARISON:  Coronal and sagittal images through the sacrum and coccyx from a CT scan of August 03, 2016 FINDINGS: On the AP view the sacrum is grossly intact. At least 3 intact struts are observed bilaterally. The SI joint spaces are reasonably well-maintained. On the lateral view the contour of the sacrum and coccyx is grossly normal. The images are limited due to osteopenia and fine detail of the sacrum is not available. The presacral soft tissues are grossly normal. IMPRESSION: No definite acute sacral or coccygeal abnormality is demonstrated but again the lateral images are quite limited. If the patient's symptoms warrant further evaluation, CT scanning or MRI would be useful. Electronically Signed   By: David  Martinique M.D.   On: 08/06/2017 08:26  Dg Abd 1 View  Result Date: 08/12/2017 CLINICAL DATA:  Small bowel obstruction. EXAM: ABDOMEN - 1 VIEW COMPARISON:  Radiograph of August 01, 2016. CT scan of August 10, 2017. FINDINGS: The bowel gas pattern is normal. Phlebolith is noted in the pelvis. Status post cholecystectomy. IMPRESSION: No evidence of bowel obstruction or ileus. Electronically Signed   By: Marijo Conception, M.D.   On: 08/12/2017 08:43   Ct Abdomen Pelvis W Contrast  Result Date: 08/10/2017 CLINICAL DATA:  Diarrhea for 6 days. EXAM: CT ABDOMEN AND PELVIS WITH CONTRAST TECHNIQUE: Multidetector CT imaging of the abdomen and pelvis was performed using the standard protocol following bolus administration of intravenous contrast. CONTRAST:  168mL ISOVUE-300 IOPAMIDOL (ISOVUE-300) INJECTION 61% COMPARISON:  08/03/2016 FINDINGS: Lower chest: No acute abnormality. Hepatobiliary: No focal liver abnormality. Previous cholecystectomy. Mild intrahepatic biliary dilatation. Similar to previous exam. Chronic increase caliber of the CBD which measures 9 mm, image 30 of series 2. Pancreas: Unremarkable. No pancreatic ductal dilatation or surrounding inflammatory changes. Spleen: Calcified granulomas identified within the spleen. Adrenals/Urinary Tract: The adrenal glands are normal.  Unremarkable appearance of both kidneys. No mass or hydronephrosis. The urinary bladder appears normal. Stomach/Bowel: The stomach appears unremarkable. The proximal and mid small bowel loops are increased in caliber measuring up to 3 cm. Transition to decreased caliber distal small bowel loops noted within the right lower quadrant of the abdomen. Unremarkable appearance of the colon. Vascular/Lymphatic: Aortic atherosclerosis. No aneurysm. Portacaval node measures 8 mm. No adenopathy identified within the abdomen or pelvis. No inguinal adenopathy. Reproductive: Uterus and bilateral adnexa are unremarkable. Other: No free fluid or fluid collections within the abdomen or pelvis. Musculoskeletal: No acute or significant osseous findings. IMPRESSION: 1. Increase caliber of the proximal and mid small bowel loops measuring up to 3 cm and suspicious for small bowel obstruction. There is a transition to decreased caliber distal small bowel loops within the right lower quadrant of the abdomen and pelvis. 2. No significant free fluid or fluid collections identified. 3. Status post cholecystectomy with chronic increase caliber of the common bile duct and intrahepatic ducts. 4.  Aortic Atherosclerosis (ICD10-I70.0). Electronically Signed   By: Kerby Moors M.D.   On: 08/10/2017 22:18      Subjective: Patient seen and examined at bedside. She feels better. She started moving bowels. No abdominal pain currently. No fever or vomiting  Discharge Exam: Vitals:   08/11/17 2109 08/12/17 0449  BP: (!) 110/47 (!) 106/44  Pulse: 89 87  Resp:    Temp: 98.2 F (36.8 C) 98 F (36.7 C)  SpO2: 99% 99%   Vitals:   08/11/17 0446 08/11/17 1429 08/11/17 2109 08/12/17 0449  BP: (!) 104/58 (!) 94/46 (!) 110/47 (!) 106/44  Pulse: 88 80 89 87  Resp: 18 18    Temp: 98 F (36.7 C) 98.3 F (36.8 C) 98.2 F (36.8 C) 98 F (36.7 C)  TempSrc: Oral Oral Oral Oral  SpO2: 96% 99% 99% 99%  Weight: 59 kg (130 lb)     Height:  5\' 5"  (1.651 m)       General: Pt is alert, awake, not in acute distress Cardiovascular: rate controlled, S1/S2 + Respiratory: bilateral decreased breath sounds at bases Abdominal: Soft, NT, ND, bowel sounds + Extremities: no edema, no cyanosis    The results of significant diagnostics from this hospitalization (including imaging, microbiology, ancillary and laboratory) are listed below for reference.     Microbiology: No results found for this or any  previous visit (from the past 240 hour(s)).   Labs: BNP (last 3 results) No results for input(s): BNP in the last 8760 hours. Basic Metabolic Panel:  Recent Labs Lab 08/05/17 1538 08/10/17 2120 08/12/17 0649  NA 138 137 141  K 4.0 3.1* 3.8  CL 102 102 114*  CO2 30 24 18*  GLUCOSE 97 100* 61*  BUN 17 18 7   CREATININE 0.69 0.64 0.63  CALCIUM 9.1 8.6* 7.9*  MG 1.8 1.5*  --    Liver Function Tests:  Recent Labs Lab 08/05/17 1538 08/10/17 2120 08/12/17 0649  AST 23 30 23   ALT 16 27 22   ALKPHOS 99 130* 93  BILITOT 0.6 0.5 0.8  PROT 7.0 6.8 5.2*  ALBUMIN 4.0 3.5 2.7*    Recent Labs Lab 08/10/17 2120  LIPASE 24   No results for input(s): AMMONIA in the last 168 hours. CBC:  Recent Labs Lab 08/05/17 1538 08/10/17 2120 08/12/17 0649  WBC 7.9 5.8 7.1  NEUTROABS 5.9 3.7  --   HGB 12.9 11.9* 10.1*  HCT 40.0 36.5 32.2*  MCV 96.1 95.3 95.5  PLT 254.0 239 219   Cardiac Enzymes: No results for input(s): CKTOTAL, CKMB, CKMBINDEX, TROPONINI in the last 168 hours. BNP: Invalid input(s): POCBNP CBG: No results for input(s): GLUCAP in the last 168 hours. D-Dimer No results for input(s): DDIMER in the last 72 hours. Hgb A1c No results for input(s): HGBA1C in the last 72 hours. Lipid Profile No results for input(s): CHOL, HDL, LDLCALC, TRIG, CHOLHDL, LDLDIRECT in the last 72 hours. Thyroid function studies No results for input(s): TSH, T4TOTAL, T3FREE, THYROIDAB in the last 72 hours.  Invalid input(s):  FREET3 Anemia work up No results for input(s): VITAMINB12, FOLATE, FERRITIN, TIBC, IRON, RETICCTPCT in the last 72 hours. Urinalysis    Component Value Date/Time   COLORURINE STRAW (A) 08/11/2017 0719   APPEARANCEUR CLEAR 08/11/2017 0719   LABSPEC 1.030 08/11/2017 0719   PHURINE 5.0 08/11/2017 0719   GLUCOSEU NEGATIVE 08/11/2017 0719   GLUCOSEU NEGATIVE 03/11/2017 1242   HGBUR NEGATIVE 08/11/2017 0719   BILIRUBINUR NEGATIVE 08/11/2017 0719   BILIRUBINUR neg 06/22/2013 1610   KETONESUR NEGATIVE 08/11/2017 0719   PROTEINUR NEGATIVE 08/11/2017 0719   UROBILINOGEN 0.2 03/11/2017 1242   NITRITE NEGATIVE 08/11/2017 0719   LEUKOCYTESUR NEGATIVE 08/11/2017 0719   Sepsis Labs Invalid input(s): PROCALCITONIN,  WBC,  LACTICIDVEN Microbiology No results found for this or any previous visit (from the past 240 hour(s)).   Time coordinating discharge: Over 30 minutes  SIGNED:   Aline August, MD  [facility] 08/12/2017, 11:20 AM Pager: [phone]  If 7PM-7AM, please contact night-coverage [email or web address] Password [email or web address]

## 2017-08-12 NOTE — Discharge Summary (Signed)
Patient discharging home with Husband in private vehicle. Patient is alert and oriented x4. Patient skin is intact. Discharge instructions discussed with patient and patient verbalized understanding

## 2017-08-12 NOTE — Consult Note (Signed)
[facility] CM Primary Care Navigator  08/12/2017  BECKA LAGASSE 08-Dec-1940 [id number]    Seenpatientand met with husband (Sam) at the bedside to identify possible discharge needs.  Patientreports having "explosive diarrhea and increased abdominal pain" that led to this admission.  Patient [facility] as theprimary care provider.  [place] in [place] to obtain medications without any problem. Patient reports that she hasbeenmanaging hermedications at home using "pill box" system filled weekly. Patient reportsthat she was driving prior to admission, but husband will be able toprovidetransportation to herdoctors'appointments after discharge.  Patient's husband will be the primary caregiver at home and daughter (Page T.) will be able to assist with care needs as well.  Anticipated discharge plan is home according to patient.  Patientexpressed understanding to call primary care provider's office when she returnshome for a post discharge follow-up appointment within a week or sooner if needed. Patient letter (with PCP's contact number) was provided as a reminder.  Explained to patientand husband about [facility] CM services available for health management at home but both deniedany current needs or concerns at this time. She states managing well and has not been bothered at all since she had quit smoking in 2008.  [place] declinedEMMI calls tofollow-up recoveryat home stating that both her and husband "can help each to monitor recovery" and will call provider if starts having any issues.  Patient voiced understandingtoseekreferral to [place] from primary care provider if deemed necessary in thefuture.  [facility] care management information provided for future needs that he may have.  For questions, please contact:  Dannielle Huh, BSN, RN-  [facility] Primary Care Navigator  Telephone: [phone] [place]

## 2017-08-12 NOTE — Progress Notes (Signed)
Contacted Dr. Starla Link per patient request. Says had diarhhea after first full liquid diet and wanted to stay. Per MD will not keep patient. Ok to advance diet to soft and pt still to go home. Already has follow up on Wednesday with gastro

## 2017-08-13 ENCOUNTER — Telehealth: Payer: Self-pay

## 2017-08-13 NOTE — Telephone Encounter (Signed)
Hospital follow up call made . Message  left for return call.

## 2017-08-13 NOTE — Telephone Encounter (Signed)
Called and left a message with new date and time due to out of office  [name]

## 2017-08-14 ENCOUNTER — Encounter: Payer: Self-pay | Admitting: Internal Medicine

## 2017-08-14 ENCOUNTER — Ambulatory Visit (INDEPENDENT_AMBULATORY_CARE_PROVIDER_SITE_OTHER): Payer: Medicare HMO | Admitting: Internal Medicine

## 2017-08-14 ENCOUNTER — Telehealth: Payer: Self-pay

## 2017-08-14 VITALS — BP 100/70 | HR 100 | Ht 65.0 in | Wt 125.8 lb

## 2017-08-14 DIAGNOSIS — K581 Irritable bowel syndrome with constipation: Secondary | ICD-10-CM

## 2017-08-14 DIAGNOSIS — D649 Anemia, unspecified: Secondary | ICD-10-CM | POA: Diagnosis not present

## 2017-08-14 DIAGNOSIS — K200 Eosinophilic esophagitis: Secondary | ICD-10-CM | POA: Diagnosis not present

## 2017-08-14 MED ORDER — BENEFIBER ON THE GO PO PACK: 1.0000 | PACK | Freq: Every day | ORAL | Status: DC

## 2017-08-14 NOTE — Patient Instructions (Signed)
   Hope you get to stay out of the hospital.  Please do the following:  1) Finish budesonide slurry you have and stop taking 2) Take 1 packet of Benefiber each day - can take up to 3 a day if needed 3) If swallowing worsens before you see me then call before next visit  I appreciate the opportunity to care for you. Gatha Mayer, MD, [facility]

## 2017-08-14 NOTE — Progress Notes (Signed)
Anne [name] 76 y.o. Jun 03, 1941 [id number]  Assessment & Plan:   Encounter Diagnoses  Name Primary?  . Irritable bowel syndrome with constipation Yes  . Eosinophilic esophagitis   . Mild anemia     Think she probably got into trouble taking Imodium to check the diarrhea caused by MiraLAX. We will try a less aggressive regimen though what she was instructed to do was reasonable. I don't think she needs a colonoscopy she had a negative one in 2011. A mild anemia seems to be delusional most likely. She just got out of the hospital she can have that followed up at my next visit or with primary care at some point. She is not having significant dysphagia at this time the one episode was when she was in a hurry and talking so will observe. As far as treatment she will do the following:  Take Benefiber qd up to 3 pkts Finish budesonide bottle and observe Re-dilate the esophagus prn RTC 2 mos   Subjective:   Chief Complaint: Follow-up of a bowel obstruction versus ileus  HPI The patient is here with her husband, she was hospitalized September 29 to 10 1 with an ileus more likely than SBO though CT scan suggested a transition point surgery did not think she had a bowel obstruction. She had been struggling with constipation which has been an issue off and on with IBS etc. for many years, she was doing well and then started having the constipation. She was advised to use MiraLAX which would cause diarrhea so then she would take Imodium and not go to the bathroom for a while. Then ultimately she started having profuse diarrhea and went to the hospital where she was admitted CT demonstrated some dilation of the small bowel loops with a possible transition point. She was seen in consultation by surgery who did not think she had a bowel obstruction. I reviewed the CT scan labs hospital notes all. She had some transient hypoxemia. She felt a little short of breath a chest x-ray was negative. By the  discharge day she had no dilated bowel loops. She is not moved her bowels in a couple of days. She is on a liquid diet. Her initial blood pressure here was 80 systolic which made her very nervous she said she was slightly lightheaded.  [address] and struck coccyx in the days before admission but x-ray was negative.  She is using something called Pill glide which is a strawberry flavored substance and that helps her swallow pills.  She's only had one episode of dysphagia since last dilation and using budesonide and she was in a hurry in a group of people talking etc. She is almost finished her current bottle of budesonide.  Allergies  Allergen Reactions  . Codeine Other (See Comments)    "flu like symptoms"  . Morphine Other (See Comments)    "flu like symptoms"  . Peanut-Containing Drug Products Hives  . Sorbitol Other (See Comments)    GI Issues  . Tomato Diarrhea  . Zofran Other (See Comments)    headache  . Advil [Ibuprofen] Other (See Comments)    Irritates throat.  . Diphenhydramine Hcl Other (See Comments)    "nervous and upset"  . Oxycodone Anxiety    Confusion with inability to think clearly   Current Meds  Medication Sig  . acetaminophen (TYLENOL) 500 MG tablet Take 500-1,000 mg by mouth every 6 (six) hours as needed (for pain.).  [name] acyclovir (ZOVIRAX) 400 MG  tablet Take 2 tablets (800 mg total) by mouth 5 (five) times daily.  [name] amitriptyline (ELAVIL) 150 MG tablet Take 1 tablet (150 mg total) by mouth at bedtime.  [name] aspirin EC 81 MG tablet Take 81 mg by mouth daily.  [name] atorvastatin (LIPITOR) 40 MG tablet TAKE 1 TABLET EVERY DAY (Patient taking differently: TAKE 40 mg TABLET EVERY DAY)  . Cholecalciferol (VITAMIN D3) 1000 UNITS CAPS Take 2,000 Units by mouth daily.   . Cyanocobalamin (VITAMIN B 12 PO) Take 1 tablet by mouth daily.  [name] diltiazem (CARDIZEM) 30 MG tablet Take 1 tablet (30 mg total) by mouth daily. take 1 tablet by mouth q am FOR PALPITATIONS  . ferrous sulfate  325 (65 FE) MG tablet Take 325 mg by mouth daily with breakfast.  . LORazepam (ATIVAN) 1 MG tablet Take 1 tablet (1 mg total) by mouth every 8 (eight) hours as needed for anxiety.  . NONFORMULARY OR COMPOUNDED ITEM Budesonide Slurry  - 2mg /13ml   - Take 2mg  daily x 30 days with 2 refills Wait 15-30 minutes before drinking or eating after taking  . pantoprazole (PROTONIX) 40 MG tablet Take 1 tablet (40 mg total) by mouth daily.  . Probiotic Product (PROBIOTIC FORMULA PO) Take 1 capsule by mouth daily.  [name] triamcinolone cream (KENALOG) 0.1 % Apply 1 application topically as needed.   Past Medical History:  Diagnosis Date  . Abdominal pain 08/11/2017  . ALKALINE PHOSPHATASE, ELEVATED 03/15/2009  . Allergic state 06/10/2012  . Anemia   . Anxiety   . Anxiety and depression 04/28/2011  . Arthritis   . Atypical chest pain 11/30/2011  . 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 Baker's cyst of knee 05/22/2011  . 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 Carotid artery disease ([place])    a. Carotid duplex 03/2014: stable 1-39% BICA, f/u due 03/2016.  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    [name] COPD (chronic obstructive pulmonary disease) ([place])   . Dermatitis 11/23/2012  . Dysphagia   . EE (eosinophilic esophagitis)   . Elevated sed rate 08/02/2013  . Esophageal ring   . ESOPHAGEAL STRICTURE 03/29/2009  . Fall 11/23/2012  . Family history of breast cancer   . Family history of colon cancer   . Family history of ovarian cancer   . Family history of pancreatic cancer   . Folliculitis of nose 05/13/6377  . GERD (gastroesophageal reflux disease) 09/29/2009   improved s/p cholecystectomy and esophagus dilatation  . Hematuria   . History of chicken pox   . History of measles   . History of shingles    2 episodes  . Hx of echocardiogram    a. Echo 01/2013: mild LVH, EF 55-60%, normal wall  motion, Gr 1 diast dysfn  . Hyperlipidemia   . Knee pain, bilateral 07/23/2011  . Medicare annual wellness visit, subsequent 06/19/2015  . Mixed hyperlipidemia 10/17/2010   Qualifier: Diagnosis of  By: Mack Guise    . Orthostasis   . Osteopenia 03/14/2011  . Personal history of chemotherapy 2001  . Personal history of radiation therapy 2001   rt breast  . 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 Pneumonia 2-10   pleurisy  . PVC's (premature ventricular contractions)    a. Event monitor 01/2013: NSR, extensive PVCs.  . Radial neck fracture 10/2011   minimally displaced  .  Sinusitis, acute 12/04/2015  . Trauma 10/18/2011  . URI (upper respiratory infection) 09/02/2012  . Urinary frequency 12/12/2014  . Urinary incontinence 03/19/2012  . Vaginitis 05/22/2011   Past Surgical History:  Procedure Laterality Date  . ANTERIOR CRUCIATE LIGAMENT REPAIR  08, 09, 10  . AUGMENTATION MAMMAPLASTY Right 05/27/2007  . BREAST BIOPSY Right 01/02/2007   wire loc  . BREAST BIOPSY  12/26/2006  . BREAST LUMPECTOMY Right 2001  . BREAST RECONSTRUCTION  2008, 2009, 2010  . BREAST REDUCTION WITH MASTOPEXY Left 05/30/2017   Procedure: LEFT BREAST REDUCTION FOR SYMTERY WITH MASTOPEXY;  Surgeon: Wallace Going, DO;  Location: [facility];  Service: Plastics;  Laterality: Left;  . CHOLECYSTECTOMY  2010  . COLONOSCOPY  09/05/10   cecal avm's  . DENTAL SURGERY  05/2016   4 dental implants by Dr. Loyal Gambler.  [name] ERCP  2010    CBD stone extraction   . ESOPHAGOGASTRODUODENOSCOPY  01/08/2012   Procedure: ESOPHAGOGASTRODUODENOSCOPY (EGD);  Surgeon: Gatha Mayer, MD;  Location: [facility];  Service: Endoscopy;  Laterality: N/A;  . ESOPHAGOGASTRODUODENOSCOPY (EGD) WITH ESOPHAGEAL DILATION  2010, 2012  . LAPAROSCOPIC APPENDECTOMY N/A 08/04/2016   Procedure: APPENDECTOMY LAPAROSCOPIC;  Surgeon: Michael Boston, MD;  Location: [facility];  Service: General;  Laterality: N/A;  . MASTECTOMY MODIFIED RADICAL  Right 05/27/2007   , Mastectomy modified radical (08), breast reconstruction, CA lesions excised lateral abd wall 2010  . SAVORY DILATION  01/08/2012   Procedure: SAVORY DILATION;  Surgeon: Gatha Mayer, MD;  Location: [facility];  Service: Endoscopy;  Laterality: N/A;  need xray   Review of Systems As per HPI  Objective:   Physical Exam BP 100/70 (BP Location: Left Arm, Patient Position: Sitting, Cuff Size: Normal)   Pulse 100   Ht 5\' 5"  (1.651 m)   Wt 125 lb 12.8 oz (57.1 kg)   BMI 20.93 kg/m  NAD Anxious abd sot and NT BS+  25 minutes time spent with patient > half in counseling coordination of care

## 2017-08-14 NOTE — Telephone Encounter (Signed)
Called patient on 08/13/17 and 08/14/17 for hospital follow up appointment. Left message for return call.

## 2017-08-15 ENCOUNTER — Telehealth: Payer: Self-pay

## 2017-08-15 NOTE — Telephone Encounter (Signed)
TCM  Call attempted. Left message for return call from patient. Day 3.

## 2017-08-16 ENCOUNTER — Telehealth: Payer: Self-pay

## 2017-08-16 NOTE — Telephone Encounter (Signed)
Called patient regarding TCM hospital follow up. Left message for return call. Called patient fo rthe past 2-3 days regarding this.

## 2017-08-19 ENCOUNTER — Telehealth: Payer: Self-pay

## 2017-08-19 ENCOUNTER — Encounter: Payer: Self-pay | Admitting: Family Medicine

## 2017-08-19 NOTE — Telephone Encounter (Signed)
Left message for patient to return my call regading hospital follow up. Call # [phone]

## 2017-08-26 ENCOUNTER — Ambulatory Visit: Payer: Medicare HMO | Admitting: Hematology

## 2017-08-26 ENCOUNTER — Other Ambulatory Visit: Payer: Medicare HMO

## 2017-08-28 ENCOUNTER — Other Ambulatory Visit: Payer: Medicare HMO

## 2017-08-28 ENCOUNTER — Ambulatory Visit: Payer: Medicare HMO | Admitting: Hematology

## 2017-08-29 ENCOUNTER — Encounter: Payer: Self-pay | Admitting: Internal Medicine

## 2017-08-30 ENCOUNTER — Encounter: Payer: Self-pay | Admitting: Internal Medicine

## 2017-09-04 ENCOUNTER — Encounter: Payer: Self-pay | Admitting: Internal Medicine

## 2017-09-04 ENCOUNTER — Other Ambulatory Visit (HOSPITAL_BASED_OUTPATIENT_CLINIC_OR_DEPARTMENT_OTHER): Payer: Medicare HMO

## 2017-09-04 ENCOUNTER — Ambulatory Visit (HOSPITAL_BASED_OUTPATIENT_CLINIC_OR_DEPARTMENT_OTHER): Payer: Medicare HMO | Admitting: Hematology

## 2017-09-04 ENCOUNTER — Encounter: Payer: Self-pay | Admitting: Hematology

## 2017-09-04 VITALS — BP 106/70 | HR 93 | Temp 98.7°F | Resp 18 | Ht 65.0 in | Wt 124.2 lb

## 2017-09-04 DIAGNOSIS — Z853 Personal history of malignant neoplasm of breast: Secondary | ICD-10-CM

## 2017-09-04 DIAGNOSIS — Z1231 Encounter for screening mammogram for malignant neoplasm of breast: Secondary | ICD-10-CM

## 2017-09-04 DIAGNOSIS — M8580 Other specified disorders of bone density and structure, unspecified site: Secondary | ICD-10-CM

## 2017-09-04 DIAGNOSIS — D649 Anemia, unspecified: Secondary | ICD-10-CM

## 2017-09-04 LAB — COMPREHENSIVE METABOLIC PANEL
ALT: 17 U/L (ref 0–55)
AST: 19 U/L (ref 5–34)
Albumin: 3.8 g/dL (ref 3.5–5.0)
Alkaline Phosphatase: 107 U/L (ref 40–150)
Anion Gap: 11 mEq/L (ref 3–11)
BUN: 13.2 mg/dL (ref 7.0–26.0)
CALCIUM: 9.5 mg/dL (ref 8.4–10.4)
CHLORIDE: 104 meq/L (ref 98–109)
CO2: 26 mEq/L (ref 22–29)
CREATININE: 0.8 mg/dL (ref 0.6–1.1)
EGFR: >60 mL/min/{1.73_m2} (ref >60)
GLUCOSE: 97 mg/dL (ref 70–140)
POTASSIUM: 3.8 meq/L (ref 3.5–5.1)
SODIUM: 141 meq/L (ref 136–145)
Total Bilirubin: 0.34 mg/dL (ref 0.20–1.20)
Total Protein: 7 g/dL (ref 6.4–8.3)

## 2017-09-04 LAB — CBC WITH DIFFERENTIAL/PLATELET
BASO%: 0.7 % (ref 0.0–2.0)
Basophils Absolute: 0 10*3/uL (ref 0.0–0.1)
EOS%: 2.5 % (ref 0.0–7.0)
Eosinophils Absolute: 0.2 10*3/uL (ref 0.0–0.5)
HEMATOCRIT: 35.4 % (ref 34.8–46.6)
HEMOGLOBIN: 11.7 g/dL (ref 11.6–15.9)
LYMPH#: 1.8 10*3/uL (ref 0.9–3.3)
LYMPH%: 26.3 % (ref 14.0–49.7)
MCH: 30.9 pg (ref 25.1–34.0)
MCHC: 33 g/dL (ref 31.5–36.0)
MCV: 93.6 fL (ref 79.5–101.0)
MONO#: 0.4 10*3/uL (ref 0.1–0.9)
MONO%: 6.6 % (ref 0.0–14.0)
NEUT#: 4.3 10*3/uL (ref 1.5–6.5)
NEUT%: 63.9 % (ref 38.4–76.8)
Platelets: 231 10*3/uL (ref 145–400)
RBC: 3.78 10*6/uL (ref 3.70–5.45)
RDW: 14.7 % — AB (ref 11.2–14.5)
WBC: 6.7 10*3/uL (ref 3.9–10.3)

## 2017-09-04 NOTE — Progress Notes (Signed)
[facility]  [phone] [phone]  Clinic Follow up Note   Patient Care Team: [name], MD as PCP - General (Family Medicine) Paralee Cancel, MD as Consulting Physician (Orthopedic Surgery) Fay Records, MD as Consulting Physician (Cardiology) Gordy Levan, MD as Consulting Physician (Oncology) Gatha Mayer, MD as Consulting Physician (Gastroenterology) Clent Jacks, MD as Consulting Physician (Ophthalmology) Roney Jaffe, DDS (Oral Surgery) Everlene Other (Dentistry) Renda Rolls, Jennefer Bravo, MD as Referring Physician (Dermatology) Dillingham, Loel Lofty, DO as Attending Physician (Plastic Surgery) 09/04/2017   ONCOLOGIC HISTORY By Dr. Toma Copier History of 2 primary lobular right breast carcinomas. 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 I believe that she had local radiation, tho that information is not available in this EMR.  Second diagnosis was Jan. 2008 (tho patient did not agree to surgery until July 2008), invasive lobular carcinoma ER 81%, PR 96%, Her 2 1+ ([id number]) , 1.5 cm invasive lobular with 3 axillary nodes negative, LVSI and perineural invasion present ([id number]). She subsequently declined adjuvant systemic treatment . She had a complicated course following right mastectomy, with difficult healing after right breast reconstruction.   She had skin recurrence lateral right chest wall excised in Sept 2010 then treated with xeloda from Oct 2010 thru Feb 2011 and began on Arimidex July 2011; she may have stopped Arimidex after bone density scan 02-2014, or possibly the year prior (?).   She has had no documented active disease since this most recent treatment, including  PET 05-14-12 with no evidence of metastatic disease. Bone density scan [facility] 02-15-14 still osteopenic range, slightly lower in LS compared with 2013 and stable in hips.  CURRENT THERAPY: Surveillance  INTERVAL HISTORY:  JOSSETTE [name] Is a 76 y.o. female who is here for a follow- up on her two Lobular right breast carcinomas. She presents to the clinic today with her husband.  She reports she is doing well and last fall she had her appendix removed. She was in the hospital due to intestinal blockage and was constipated. Her BM are much better now. She now takes Materials engineer. She had a breast reduction for symmetry on 05/30/17 and will go back next month for another with Dr. Marla Roe.   She notes having phantom shooting pain in her breast still.   REVIEW OF SYSTEMS:   Constitutional: Denies fevers, chills or abnormal weight loss Eyes: Denies blurriness of vision Ears, nose, mouth, throat, and face: Denies mucositis or sore throat  Respiratory: Denies cough, dyspnea or wheezes Cardiovascular: Denies palpitation, chest discomfort or lower extremity swelling Gastrointestinal:  Denies nausea, heartburn or change in bowel habits Skin: Denies abnormal skin rashes Lymphatics: Denies new lymphadenopathy or easy bruising Neurological:Denies numbness, tingling or new weaknesses Behavioral/Psych: Mood is stable, no new changes  MSK: negative All other systems were reviewed with the patient and are negative. Breast: (+) infrequent shooting pain in the right breast   MEDICAL HISTORY:  Past Medical History:  Diagnosis Date  . Abdominal pain 08/11/2017  . ALKALINE PHOSPHATASE, ELEVATED 03/15/2009  . Allergic state 06/10/2012  . Anemia   . Anxiety   . Anxiety and depression 04/28/2011  . Arthritis   . Atypical chest pain 11/30/2011  . AVM (arteriovenous malformation) of colon  2011   cecum  . Baker's cyst of knee 05/22/2011  . Cancer ([place]) 01,  08   XRT/chemo 01-02/ lobular invasive ca  .  Carotid artery disease ([place])    a. Carotid duplex 03/2014: stable 1-39% BICA, f/u due 03/2016.  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    [name] COPD (chronic obstructive pulmonary disease) ([place])   . Dermatitis 11/23/2012  . Dysphagia   . EE (eosinophilic esophagitis)   . Elevated sed rate 08/02/2013  . Esophageal ring   . ESOPHAGEAL STRICTURE 03/29/2009  . Fall 11/23/2012  . Family history of breast cancer   . Family history of colon cancer   . Family history of ovarian cancer   . Family history of pancreatic cancer   . Folliculitis of nose 8/78/6767  . GERD (gastroesophageal reflux disease) 09/29/2009   improved s/p cholecystectomy and esophagus dilatation  . Hematuria   . History of chicken pox   . History of measles   . History of shingles    2 episodes  . Hx of echocardiogram    a. Echo 01/2013: mild LVH, EF 55-60%, normal wall motion, Gr 1 diast dysfn  . Hyperlipidemia   . Knee pain, bilateral 07/23/2011  . Medicare annual wellness visit, subsequent 06/19/2015  . Mixed hyperlipidemia 10/17/2010   Qualifier: Diagnosis of  By: Mack Guise    . Orthostasis   . Osteopenia 03/14/2011  . Personal history of chemotherapy 2001  . Personal history of radiation therapy 2001   rt breast  . 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 Pneumonia 2-10   pleurisy  . PVC's (premature ventricular contractions)    a. Event monitor 01/2013: NSR, extensive PVCs.  . Radial neck fracture 10/2011   minimally displaced  . Sinusitis, acute 12/04/2015  . Trauma 10/18/2011  . URI (upper respiratory infection) 09/02/2012  . Urinary frequency 12/12/2014  . Urinary incontinence 03/19/2012  . Vaginitis 05/22/2011    SURGICAL HISTORY: Past Surgical History:  Procedure Laterality Date  . ANTERIOR CRUCIATE LIGAMENT REPAIR  08, 09, 10  . AUGMENTATION MAMMAPLASTY Right 05/27/2007  . BREAST BIOPSY Right 01/02/2007   wire loc  . BREAST  BIOPSY  12/26/2006  . BREAST LUMPECTOMY Right 2001  . BREAST RECONSTRUCTION  2008, 2009, 2010  . BREAST REDUCTION WITH MASTOPEXY Left 05/30/2017   Procedure: LEFT BREAST REDUCTION FOR SYMTERY WITH MASTOPEXY;  Surgeon: Wallace Going, DO;  Location: [facility];  Service: Plastics;  Laterality: Left;  . CHOLECYSTECTOMY  2010  . COLONOSCOPY  09/05/10   cecal avm's  . DENTAL SURGERY  05/2016   4 dental implants by Dr. Loyal Gambler.  [name] ERCP  2010    CBD stone extraction   . ESOPHAGOGASTRODUODENOSCOPY  01/08/2012   Procedure: ESOPHAGOGASTRODUODENOSCOPY (EGD);  Surgeon: Gatha Mayer, MD;  Location: [facility];  Service: Endoscopy;  Laterality: N/A;  . ESOPHAGOGASTRODUODENOSCOPY (EGD) WITH ESOPHAGEAL DILATION  2010, 2012  . LAPAROSCOPIC APPENDECTOMY N/A 08/04/2016   Procedure: APPENDECTOMY LAPAROSCOPIC;  Surgeon: Michael Boston, MD;  [facility];  Service: General;  Laterality: N/A;  . MASTECTOMY MODIFIED RADICAL Right 05/27/2007   , Mastectomy modified radical (08), breast reconstruction, CA lesions excised lateral abd wall 2010  . SAVORY DILATION  01/08/2012   Procedure: SAVORY DILATION;  Surgeon: Gatha Mayer, MD;  [facility];  Service: Endoscopy;  Laterality: N/A;  need xray    I have reviewed the  social history and family history with the patient and they are unchanged from previous note.  ALLERGIES:  is allergic to codeine; morphine; peanut-containing drug products; sorbitol; tomato; zofran; advil [ibuprofen]; diphenhydramine hcl; and oxycodone.  MEDICATIONS:  Current Outpatient Prescriptions  Medication Sig Dispense Refill  . amitriptyline (ELAVIL) 150 MG tablet Take 1 tablet (150 mg total) by mouth at bedtime. 30 tablet 5  . aspirin EC 81 MG tablet Take 81 mg by mouth daily.    [name] atorvastatin (LIPITOR) 40 MG tablet TAKE 1 TABLET EVERY DAY (Patient taking differently: TAKE 40 mg TABLET EVERY DAY) 90 tablet 3  . Cholecalciferol (VITAMIN D3) 1000 UNITS  CAPS Take 2,000 Units by mouth daily.     . Cyanocobalamin (VITAMIN B 12 PO) Take 1 tablet by mouth daily.    [name] diltiazem (CARDIZEM) 30 MG tablet Take 1 tablet (30 mg total) by mouth daily. take 1 tablet by mouth q am FOR PALPITATIONS    . ferrous sulfate 325 (65 FE) MG tablet Take 325 mg by mouth daily with breakfast.    . LORazepam (ATIVAN) 1 MG tablet Take 1 tablet (1 mg total) by mouth every 8 (eight) hours as needed for anxiety. 70 tablet 5  . NONFORMULARY OR COMPOUNDED ITEM Budesonide Slurry  - 2mg /65ml   - Take 2mg  daily x 30 days with 2 refills Wait 15-30 minutes before drinking or eating after taking 300 each 2  . pantoprazole (PROTONIX) 40 MG tablet Take 1 tablet (40 mg total) by mouth daily. 90 tablet 0  . Probiotic Product (PROBIOTIC FORMULA PO) Take 1 capsule by mouth daily.    [name] triamcinolone cream (KENALOG) 0.1 % Apply 1 application topically as needed.    . Wheat Dextrin (BENEFIBER ON THE GO) PACK Take 1 Package by mouth daily.    [name] acetaminophen (TYLENOL) 500 MG tablet Take 500-1,000 mg by mouth every 6 (six) hours as needed (for pain.).     No current facility-administered medications for this visit.     PHYSICAL EXAMINATION: ECOG PERFORMANCE STATUS: 0 - Asymptomatic  Vitals:   09/04/17 1617  BP: 106/70  Pulse: 93  Resp: 18  Temp: 98.7 F (37.1 C)  SpO2: 99%   Filed Weights   09/04/17 1617  Weight: 124 lb 3.2 oz (56.3 kg)    GENERAL:alert, no distress and comfortable SKIN: skin color, texture, turgor are normal, no rashes or significant lesions EYES: normal, Conjunctiva are pink and non-injected, sclera clear OROPHARYNX:no exudate, no erythema and lips, buccal mucosa, and tongue normal  NECK: supple, thyroid normal size, non-tender, without nodularity LYMPH:  no palpable lymphadenopathy in the cervical, axillary or inguinal LUNGS: clear to auscultation and percussion with normal breathing effort HEART: regular rate & rhythm and no murmurs and no lower  extremity edema ABDOMEN:abdomen soft, non-tender and normal bowel sounds Musculoskeletal:no cyanosis of digits and no clubbing  NEURO: alert & oriented x 3 with fluent speech, no focal motor/sensory deficits Breasts: Breast inspection showed status post right mastectomy and reconstruction, asymmetrical, no nipple discharge. Palpation of the breasts and axilla revealed no obvious mass that I could appreciate. (+) left breast s/o breast reduction, incision has healed well, still tender     LABORATORY DATA:  I have reviewed the data as listed CBC Latest Ref Rng & Units 09/04/2017 08/12/2017 08/10/2017  WBC 3.9 - 10.3 10e3/uL 6.7 7.1 5.8  Hemoglobin 11.6 - 15.9 g/dL 11.7 10.1(L) 11.9(L)  Hematocrit 34.8 - 46.6 % 35.4 32.2(L) 36.5  Platelets 145 -  400 10e3/uL 231 219 239     CMP Latest Ref Rng & Units 09/04/2017 08/12/2017 08/10/2017  Glucose 70 - 140 mg/dl 97 61(L) 100(H)  BUN 7.0 - 26.0 mg/dL 13.2 7 18   Creatinine 0.6 - 1.1 mg/dL 0.8 0.63 0.64  Sodium 136 - 145 mEq/L 141 141 137  Potassium 3.5 - 5.1 mEq/L 3.8 3.8 3.1(L)  Chloride 101 - 111 mmol/L - 114(H) 102  CO2 22 - 29 mEq/L 26 18(L) 24  Calcium 8.4 - 10.4 mg/dL 9.5 7.9(L) 8.6(L)  Total Protein 6.4 - 8.3 g/dL 7.0 5.2(L) 6.8  Total Bilirubin 0.20 - 1.20 mg/dL 0.34 0.8 0.5  Alkaline Phos 40 - 150 U/L 107 93 130(H)  AST 5 - 34 U/L 19 23 30   ALT 0 - 55 U/L 17 22 27       RADIOGRAPHIC STUDIES: I have personally reviewed the radiological images as listed and agreed with the findings in the report. No results found.   Screening Mammogram 03/29/17 IMPRESSION: No mammographic evidence of malignancy. A result letter of this screening mammogram will be mailed directly to the patient. RECOMMENDATION: Screening mammogram in one year.   ASSESSMENT & PLAN:  Anne [name] is a 76 y.o. caucasian female with a history of ostepenia, Chronic Urology problems, two lobular right breast carcinomas in 2001 and 2008.   1. History of recurrent  right breast lobular carcinoma, in 2001, 2008 and chest wall recurrence in 2010 -I reviewed her medical records extensively, and comfortable and keep findings with patient -She is clinically doing well, lab reviewed, exam was unremarkable, last mammogram in May 2017 was normal. No clinical concern of recurrence. -She is very unhappy about her cosmetic results of her construction, especially the asymmetry. She was last seen by plastic surgeon Dr. Marla Roe. According to Dr. Eusebio Friendly note, she is at high risk of complications if she redo reconstruction of right side, so she recommended left breast reduction. Pt did not seem to understand and remember that, and I encouraged her to contact Dr. Eusebio Friendly office again if she is interested in left breast reconstruction. However, she would like me to get a second opinion regarding reconstruction.  -Will continue breast cancer surveillance. She is scheduled for annual mammogram next week. I encouraged her to do self exam, and a follow-up with [place] routinely. -Patient underwent breast reduction 05/30/17 and will have another by Dr. Marla Roe in 09/2017 -She is clinically doing well. Her CBC and CMP are [facility]. Her Breast exam was unremarkable and her 03/2017 mammogram was normal. There are no concerns for recurrence.  -Her last recurrence was in 2010, I think she is good to follow up once a year with annual mammogram. She knows to contact use with any concerning symptoms.   2. Osteopenia  -She had a bone density scan in 03/2016 so she will due for another one in 2019 -She'll continue calcium and vitamin D supplement  3. Anemia:  -she still has mild anemia, her prior colonoscopy showed AVM in her Cecum, which may contribute to her anemia.  -will repeat her iron study on next visit  -Will continue to monitor with labs  -Hg normal at 11.7   4. history of alcohol and tobacco abuse 5. History of chronic urological problems, follow-up with urology 6. History  of esophageal dilatation 7. Anxiety and depression  PLAN:  -Mammogram in 03/2018 -Lab and f/u in one year    Orders Placed This Encounter  Procedures  . MM DIAG BREAST TOMO UNI LEFT  Standing Status:   Future    Standing Expiration Date:   09/04/2018    Order Specific Question:   Reason for Exam (SYMPTOM  OR DIAGNOSIS REQUIRED)    Answer:   screening    Order Specific Question:   Preferred imaging location?    Answer:   [facility]   All questions were answered. The patient knows to call the clinic with any problems, questions or concerns. No barriers to learning was detected.  I spent 20 minutes counseling the patient face to face. The total time spent in the appointment was 25 minutes and more than 50% was on counseling and review of test results   This document serves as a record of services personally performed by Truitt Merle, MD. It was created on her behalf by Joslyn Devon, a trained medical scribe. The creation of this record is based on the scribe's personal observations and the provider's statements to them. This document has been checked and approved by the attending provider.     Truitt Merle, MD 09/04/2017

## 2017-09-06 ENCOUNTER — Encounter: Payer: Self-pay | Admitting: Hematology

## 2017-09-06 ENCOUNTER — Telehealth: Payer: Self-pay | Admitting: Hematology

## 2017-09-06 NOTE — Telephone Encounter (Signed)
Spoke with patient about appts added per 10/24 sch msg.

## 2017-09-10 ENCOUNTER — Ambulatory Visit: Payer: Medicare HMO | Admitting: Family Medicine

## 2017-09-11 ENCOUNTER — Encounter (HOSPITAL_BASED_OUTPATIENT_CLINIC_OR_DEPARTMENT_OTHER): Payer: Self-pay | Admitting: *Deleted

## 2017-09-13 ENCOUNTER — Ambulatory Visit: Payer: Self-pay | Admitting: Plastic Surgery

## 2017-09-13 DIAGNOSIS — N651 Disproportion of reconstructed breast: Secondary | ICD-10-CM

## 2017-09-18 ENCOUNTER — Other Ambulatory Visit: Payer: Self-pay

## 2017-09-19 ENCOUNTER — Encounter: Payer: Self-pay | Admitting: Family Medicine

## 2017-09-19 ENCOUNTER — Ambulatory Visit: Payer: Medicare HMO | Admitting: Family Medicine

## 2017-09-19 ENCOUNTER — Ambulatory Visit (HOSPITAL_BASED_OUTPATIENT_CLINIC_OR_DEPARTMENT_OTHER)
Admission: RE | Admit: 2017-09-19 | Discharge: 2017-09-19 | Disposition: A | Discharge status: Home / Self Care | Payer: Medicare HMO | Source: Ambulatory Visit | Attending: Family Medicine | Admitting: Family Medicine

## 2017-09-19 ENCOUNTER — Other Ambulatory Visit: Payer: Self-pay | Admitting: Family Medicine

## 2017-09-19 VITALS — BP 105/64 | HR 86 | Temp 98.3°F | Resp 16 | Wt 125.6 lb

## 2017-09-19 DIAGNOSIS — M545 Low back pain: Secondary | ICD-10-CM

## 2017-09-19 DIAGNOSIS — M8580 Other specified disorders of bone density and structure, unspecified site: Secondary | ICD-10-CM | POA: Diagnosis not present

## 2017-09-19 DIAGNOSIS — Z23 Encounter for immunization: Secondary | ICD-10-CM | POA: Diagnosis not present

## 2017-09-19 DIAGNOSIS — K5909 Other constipation: Secondary | ICD-10-CM

## 2017-09-19 DIAGNOSIS — E782 Mixed hyperlipidemia: Secondary | ICD-10-CM

## 2017-09-19 DIAGNOSIS — M5136 Other intervertebral disc degeneration, lumbar region: Secondary | ICD-10-CM | POA: Insufficient documentation

## 2017-09-19 MED ORDER — TIZANIDINE HCL 2 MG PO TABS: 2.0000 mg | ORAL_TABLET | Freq: Every evening | ORAL | 1 refills | Status: DC | PRN

## 2017-09-19 NOTE — Patient Instructions (Signed)
Encouraged increased hydration and fiber in diet. Daily probiotics. If bowels not moving can use MOM 2 tbls po in 4 oz of warm prune juice by mouth every 2-3 days. If no results then repeat in 4 hours with  Dulcolax suppository pr, may repeat again in 4 more hours as needed. Seek care if symptoms worsen. Consider daily Miralax and/or Dulcolax if symptoms persist.   Try Miralax with Benefiber twice daily every days Back Pain, Adult Back pain is very common. The pain often gets better over time. The cause of back pain is usually not dangerous. Most people can learn to manage their back pain on their own. Follow these instructions at home: Watch your back pain for any changes. The following actions may help to lessen any pain you are feeling:  Stay active. Start with short walks on flat ground if you can. Try to walk farther each day.  Exercise regularly as told by your doctor. Exercise helps your back heal faster. It also helps avoid future injury by keeping your muscles strong and flexible.  Do not sit, drive, or stand in one place for more than 30 minutes.  Do not stay in bed. Resting more than 1-2 days can slow down your recovery.  Be careful when you bend or lift an object. Use good form when lifting: ? Bend at your knees. ? Keep the object close to your body. ? Do not twist.  Sleep on a firm mattress. Lie on your side, and bend your knees. If you lie on your back, put a pillow under your knees.  Take medicines only as told by your doctor.  Put ice on the injured area. ? Put ice in a plastic bag. ? Place a towel between your skin and the bag. ? Leave the ice on for 20 minutes, 2-3 times a day for the first 2-3 days. After that, you can switch between ice and heat packs.  Avoid feeling anxious or stressed. Find good ways to deal with stress, such as exercise.  Maintain a healthy weight. Extra weight puts stress on your back.  Contact a doctor if:  You have pain that does not go  away with rest or medicine.  You have worsening pain that goes down into your legs or buttocks.  You have pain that does not get better in one week.  You have pain at night.  You lose weight.  You have a fever or chills. Get help right away if:  You cannot control when you poop (bowel movement) or pee (urinate).  Your arms or legs feel weak.  Your arms or legs lose feeling (numbness).  You feel sick to your stomach (nauseous) or throw up (vomit).  You have belly (abdominal) pain.  You feel like you may pass out (faint). This information is not intended to replace advice given to you by your health care provider. Make sure you discuss any questions you have with your health care provider. Document Released: 04/16/2008 Document Revised: 04/05/2016 Document Reviewed: 03/02/2014 Elsevier Interactive Patient Education  [employer].

## 2017-09-19 NOTE — Progress Notes (Signed)
Subjective:  I acted as a Education administrator for [employer]. Yancey Flemings, [name]   Patient ID: Anne [name], female    DOB: 29-Mar-1941, 76 y.o.   MRN: [id number]  Chief Complaint  Patient presents with  . Back Pain    on going for one month    HPI  Patient is in today for an acute visit lower back pain with radicular pain to right side. No falls or recent injury. No incontinence. Heat patches have been somewhat helpful. Is also noting some thick, swollen joints in her fingers. Is noting constipaiton moving her bowels as slowly as every 14 days. Only mild abdominal pain. Denies CP/palp/SOB/HA/congestion/fevers/GI or GU c/o. Taking meds as prescribed  Patient Care Team: [name], MD as PCP - General (Family Medicine) Paralee Cancel, MD as Consulting Physician (Orthopedic Surgery) Fay Records, MD as Consulting Physician (Cardiology) Gordy Levan, MD as Consulting Physician (Oncology) Gatha Mayer, MD as Consulting Physician (Gastroenterology) Clent Jacks, MD as Consulting Physician (Ophthalmology) Roney Jaffe, DDS (Oral Surgery) Everlene Other (Dentistry) [place], Jennefer Bravo, MD as Referring Physician (Dermatology) Dillingham, Loel Lofty, DO as Attending Physician (Plastic Surgery)   Past Medical History:  Diagnosis Date  . Abdominal pain 08/11/2017  . ALKALINE PHOSPHATASE, ELEVATED 03/15/2009  . Allergic state 06/10/2012  . Anemia   . Anxiety   . Anxiety and depression 04/28/2011  . Arthritis   . Atypical chest pain 11/30/2011  . 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 Baker's cyst of knee 05/22/2011  . Cancer ([place]) 01,  08   XRT/chemo 01-02/ lobular invasive ca  . Carotid artery disease ([place])    a. Carotid duplex 03/2014: stable 1-39% BICA, f/u due 03/2016.  [name] Chronic alcoholism in remission ([place]) 03/29/2011   Did not tolerate Klonopin, caused some confusion and bad dreams.    [name] COPD (chronic obstructive pulmonary disease) ([place])   . Dermatitis 11/23/2012  .  Dysphagia   . EE (eosinophilic esophagitis)   . Elevated sed rate 08/02/2013  . Esophageal ring   . ESOPHAGEAL STRICTURE 03/29/2009  . Fall 11/23/2012  . Family history of breast cancer   . Family history of colon cancer   . Family history of ovarian cancer   . Family history of pancreatic cancer   . Folliculitis of nose 0/08/2724  . GERD (gastroesophageal reflux disease) 09/29/2009   improved s/p cholecystectomy and esophagus dilatation  . Hematuria   . History of chicken pox   . History of measles   . History of shingles    2 episodes  . Hx of echocardiogram    a. Echo 01/2013: mild LVH, EF 55-60%, normal wall motion, Gr 1 diast dysfn  . Hyperlipidemia   . Knee pain, bilateral 07/23/2011  . Medicare annual wellness visit, subsequent 06/19/2015  . Mixed hyperlipidemia 10/17/2010   Qualifier: Diagnosis of  By: Mack Guise    . Orthostasis   . Osteopenia 03/14/2011  . Personal history of chemotherapy 2001  . Personal history of radiation therapy 2001   rt breast  . 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 Pneumonia 2-10   pleurisy  . PVC's (premature ventricular contractions)    a. Event monitor 01/2013: NSR, extensive PVCs.  . Radial neck fracture 10/2011   minimally displaced  . Sinusitis, acute 12/04/2015  . Trauma 10/18/2011  . URI (upper respiratory infection) 09/02/2012  . Urinary frequency 12/12/2014  . Urinary incontinence 03/19/2012  . Vaginitis 05/22/2011    Past  Surgical History:  Procedure Laterality Date  . ANTERIOR CRUCIATE LIGAMENT REPAIR  08, 09, 10  . AUGMENTATION MAMMAPLASTY Right 05/27/2007  . BREAST BIOPSY Right 01/02/2007   wire loc  . BREAST BIOPSY  12/26/2006  . BREAST LUMPECTOMY Right 2001  . BREAST RECONSTRUCTION  2008, 2009, 2010  . CHOLECYSTECTOMY  2010  . COLONOSCOPY  09/05/10   cecal avm's  . DENTAL SURGERY  05/2016   4 dental implants by Dr. Loyal Gambler.  [name] ERCP  2010    CBD stone extraction   . ESOPHAGOGASTRODUODENOSCOPY (EGD) WITH ESOPHAGEAL  DILATION  2010, 2012  . MASTECTOMY MODIFIED RADICAL Right 05/27/2007   , Mastectomy modified radical (08), breast reconstruction, CA lesions excised lateral abd wall 2010    Family History  Problem Relation Age of Onset  . Heart disease Father   . Lung cancer Father        smoker  . Cirrhosis Sister        Primary Biliary  . Stroke Maternal Grandmother   . Alcohol abuse Maternal Grandfather   . Heart disease Paternal Grandfather   . Anxiety disorder Sister   . Osteoporosis Sister   . Arthritis Sister        Rheumatoid  . Osteoporosis Sister   . Skin cancer Sister        multiple skin cancers, over 56 excisions.  . Other Mother        tic [place]  . Breast cancer Maternal Aunt        dx in her 108s  . Breast cancer Paternal Aunt        dx in her 59s  . Pancreatic cancer Maternal Uncle        dx in his 33s; smoker  . Breast cancer Maternal Aunt        dx in her 79s  . Breast cancer Maternal Aunt        possible breast cancer dx and died in her 78s  . Ovarian cancer Maternal Aunt 29  . Pancreatic cancer Maternal Aunt        dx in her 80s  . Stomach cancer Maternal Uncle   . Colon cancer Maternal Uncle   . Lung cancer Paternal Aunt   . Breast cancer Cousin        paternal first cousin  . Breast cancer Cousin        maternal first cousin  . Anesthesia problems Neg Hx   . Hypotension Neg Hx   . Malignant hyperthermia Neg Hx   . Pseudochol deficiency Neg Hx     Social History   Socioeconomic History  . Marital status: Married    Spouse name: Sam  . Number of children: 3  . Years of education: Not on file  . Highest education level: Not on file  Social Needs  . Financial resource strain: Not on file  . Food insecurity - worry: Not on file  . Food insecurity - inability: Not on file  . Transportation needs - medical: Not on file  . Transportation needs - non-medical: Not on file  Occupational History  . Occupation: Social worker  Tobacco Use  . Smoking status:  Former Smoker    Packs/day: 1.00    Years: 50.00    Pack years: 50.00    Types: Cigarettes    Last attempt to quit: 05/22/2007    Years since quitting: 10.3  . Smokeless tobacco: Never Used  Substance and Sexual Activity  . Alcohol use: Yes  Comment: social  . Drug use: No  . Sexual activity: Yes    Partners: Male    Comment: lives with husband,   Other Topics Concern  . Not on file  Social History Narrative  . Not on file    Outpatient Medications Prior to Visit  Medication Sig Dispense Refill  . acetaminophen (TYLENOL) 500 MG tablet Take 500-1,000 mg by mouth every 6 (six) hours as needed (for pain.).    [name] amitriptyline (ELAVIL) 150 MG tablet Take 1 tablet (150 mg total) by mouth at bedtime. 30 tablet 5  . aspirin EC 81 MG tablet Take 81 mg by mouth daily.    [name] atorvastatin (LIPITOR) 40 MG tablet TAKE 1 TABLET EVERY DAY (Patient taking differently: TAKE 40 mg TABLET EVERY DAY) 90 tablet 3  . Cholecalciferol (VITAMIN D3) 1000 UNITS CAPS Take 2,000 Units by mouth daily.     . Cyanocobalamin (VITAMIN B 12 PO) Take 1 tablet by mouth daily.    [name] diltiazem (CARDIZEM) 30 MG tablet Take 1 tablet (30 mg total) by mouth daily. take 1 tablet by mouth q am FOR PALPITATIONS    . ferrous sulfate 325 (65 FE) MG tablet Take 325 mg by mouth daily with breakfast.    . LORazepam (ATIVAN) 1 MG tablet Take 1 tablet (1 mg total) by mouth every 8 (eight) hours as needed for anxiety. 70 tablet 5  . pantoprazole (PROTONIX) 40 MG tablet Take 1 tablet (40 mg total) by mouth daily. 90 tablet 0  . Probiotic Product (PROBIOTIC FORMULA PO) Take 1 capsule by mouth daily.    [name] triamcinolone cream (KENALOG) 0.1 % Apply 1 application topically as needed.    . Wheat Dextrin (BENEFIBER ON THE GO) PACK Take 1 Package by mouth daily.     No facility-administered medications prior to visit.     Allergies  Allergen Reactions  . Codeine Other (See Comments)    "flu like symptoms"  . Morphine Other (See  Comments)    "flu like symptoms"  . Peanut-Containing Drug Products Hives  . Sorbitol Other (See Comments)    GI Issues  . Tomato Diarrhea  . Zofran Other (See Comments)    headache  . Advil [Ibuprofen] Other (See Comments)    Irritates throat.  . Diphenhydramine Hcl Other (See Comments)    "nervous and upset"  . Oxycodone Anxiety    Confusion with inability to think clearly    Review of Systems  Constitutional: Negative for fever and malaise/fatigue.  HENT: Negative for congestion.   Respiratory: Negative for cough and shortness of breath.   Cardiovascular: Negative for chest pain and palpitations.  Gastrointestinal: Positive for constipation. Negative for blood in stool and vomiting.  Musculoskeletal: Positive for back pain and joint pain.  Skin: Negative for rash.  Neurological: Negative for loss of consciousness and headaches.       Objective:    Physical Exam  Constitutional: She is oriented to person, place, and time. She appears well-developed and well-nourished. No distress.  HENT:  Head: Normocephalic and atraumatic.  Eyes: Conjunctivae are normal.  Neck: Normal range of motion. No thyromegaly present.  Cardiovascular: Normal rate and regular rhythm.  Pulmonary/Chest: Effort normal and breath sounds normal. She has no wheezes.  Abdominal: Soft. Bowel sounds are normal. There is no tenderness.  Musculoskeletal: Normal range of motion. She exhibits no edema or deformity.  Neurological: She is alert and oriented to person, place, and time.  Skin: Skin is warm and dry.  She is not diaphoretic.  Psychiatric: She has a normal mood and affect.    BP 105/64 (BP Location: Left Arm, Patient Position: Sitting, Cuff Size: Small)   Pulse 86   Temp 98.3 F (36.8 C) (Oral)   Resp 16   Wt 125 lb 9.6 oz (57 kg)   SpO2 100%   BMI 20.90 kg/m  Wt Readings from Last 3 Encounters:  09/19/17 125 lb 9.6 oz (57 kg)  09/04/17 124 lb 3.2 oz (56.3 kg)  08/14/17 125 lb 12.8 oz  (57.1 kg)   BP Readings from Last 3 Encounters:  09/19/17 105/64  09/04/17 106/70  08/14/17 100/70     Immunization History  Administered Date(s) Administered  . Influenza Split 08/27/2011, 10/21/2012  . Influenza, High Dose Seasonal PF 08/01/2016, 09/19/2017  . Influenza,inj,Quad PF,6+ Mos 07/31/2013, 12/07/2014, 11/01/2015  . Tdap 04/08/2013    Health Maintenance  Topic Date Due  . PNA vac Low Risk Adult (1 of 2 - PCV13) 09/09/2007  . COLONOSCOPY  09/05/2020  . TETANUS/TDAP  04/09/2023  . INFLUENZA VACCINE  Completed  . DEXA SCAN  Completed    Lab Results  Component Value Date   WBC 6.7 09/04/2017   HGB 11.7 09/04/2017   HCT 35.4 09/04/2017   PLT 231 09/04/2017   GLUCOSE 97 09/04/2017   CHOL 155 08/05/2017   TRIG 145.0 08/05/2017   HDL 61.90 08/05/2017   LDLDIRECT 117.4 01/13/2013   LDLCALC 64 08/05/2017   ALT 17 09/04/2017   AST 19 09/04/2017   NA 141 09/04/2017   K 3.8 09/04/2017   CL 114 (H) 08/12/2017   CREATININE 0.8 09/04/2017   BUN 13.2 09/04/2017   CO2 26 09/04/2017   TSH 2.33 08/05/2017   INR 1.02 08/12/2017    Lab Results  Component Value Date   TSH 2.33 08/05/2017   Lab Results  Component Value Date   WBC 6.7 09/04/2017   HGB 11.7 09/04/2017   HCT 35.4 09/04/2017   MCV 93.6 09/04/2017   PLT 231 09/04/2017   Lab Results  Component Value Date   NA 141 09/04/2017   K 3.8 09/04/2017   CHLORIDE 104 09/04/2017   CO2 26 09/04/2017   GLUCOSE 97 09/04/2017   BUN 13.2 09/04/2017   CREATININE 0.8 09/04/2017   BILITOT 0.34 09/04/2017   ALKPHOS 107 09/04/2017   AST 19 09/04/2017   ALT 17 09/04/2017   PROT 7.0 09/04/2017   ALBUMIN 3.8 09/04/2017   CALCIUM 9.5 09/04/2017   ANIONGAP 11 09/04/2017   EGFR >60 09/04/2017   GFR 88.18 08/05/2017   Lab Results  Component Value Date   CHOL 155 08/05/2017   Lab Results  Component Value Date   HDL 61.90 08/05/2017   Lab Results  Component Value Date   LDLCALC 64 08/05/2017   Lab Results    Component Value Date   TRIG 145.0 08/05/2017   Lab Results  Component Value Date   CHOLHDL 3 08/05/2017   No results found for: HGBA1C       Assessment & Plan:   Problem List Items Addressed This Visit    Mixed hyperlipidemia    Encouraged heart healthy diet, increase exercise, avoid trans fats, consider a krill oil cap daily      Constipation, chronic    Encouraged increased hydration and fiber in diet. Daily probiotics. If bowels not moving can use MOM 2 tbls po in 4 oz of warm prune juice by mouth every 2-3 days. If no results  then repeat in 4 hours with  Dulcolax suppository pr, may repeat again in 4 more hours as needed. Seek care if symptoms worsen. Consider daily Miralax and/or Dulcolax if symptoms persist.       Osteopenia determined by x-ray    Encouraged to get adequate exercise, calcium and vitamin d intake      Low back pain    Encouraged moist heat and gentle stretching as tolerated. May try NSAIDs and prescription meds as directed and report if symptoms worsen or seek immediate care. Xray confirms some degenerative changes. Given refill on Zanaflex. Seek care if worsens      Relevant Orders   DG Lumbar Spine Complete (Completed)    Other Visit Diagnoses    Needs flu shot    -  Primary   Relevant Orders   Flu vaccine HIGH DOSE PF (Fluzone High dose) (Completed)      I have discontinued [place] R. Ballinas "Torie"'s tiZANidine. I am also having her maintain her Vitamin D3, Probiotic Product (PROBIOTIC FORMULA PO), Cyanocobalamin (VITAMIN B 12 PO), ferrous sulfate, aspirin EC, acetaminophen, LORazepam, amitriptyline, atorvastatin, pantoprazole, diltiazem, triamcinolone cream, and BENEFIBER ON THE GO.  Meds ordered this encounter  Medications  . DISCONTD: tiZANidine (ZANAFLEX) 2 MG tablet    Sig: Take 1 tablet (2 mg total) at bedtime as needed for up to 15 days by mouth for muscle spasms.    Dispense:  30 tablet    Refill:  1    CMA served as scribe during  this visit. History, Physical and Plan performed by medical provider. Documentation and orders reviewed and attested to.  Penni Homans, MD

## 2017-09-20 ENCOUNTER — Ambulatory Visit: Payer: Self-pay | Admitting: Plastic Surgery

## 2017-09-23 ENCOUNTER — Ambulatory Visit: Payer: Self-pay | Admitting: Plastic Surgery

## 2017-09-23 DIAGNOSIS — M47816 Spondylosis without myelopathy or radiculopathy, lumbar region: Secondary | ICD-10-CM | POA: Insufficient documentation

## 2017-09-23 DIAGNOSIS — Z9889 Other specified postprocedural states: Secondary | ICD-10-CM | POA: Diagnosis not present

## 2017-09-23 DIAGNOSIS — M545 Low back pain, unspecified: Secondary | ICD-10-CM | POA: Insufficient documentation

## 2017-09-23 DIAGNOSIS — M549 Dorsalgia, unspecified: Secondary | ICD-10-CM | POA: Insufficient documentation

## 2017-09-23 DIAGNOSIS — N651 Disproportion of reconstructed breast: Secondary | ICD-10-CM | POA: Diagnosis not present

## 2017-09-23 NOTE — Assessment & Plan Note (Signed)
Encouraged increased hydration and fiber in diet. Daily probiotics. If bowels not moving can use MOM 2 tbls po in 4 oz of warm prune juice by mouth every 2-3 days. If no results then repeat in 4 hours with  Dulcolax suppository pr, may repeat again in 4 more hours as needed. Seek care if symptoms worsen. Consider daily Miralax and/or Dulcolax if symptoms persist.  

## 2017-09-23 NOTE — Assessment & Plan Note (Signed)
Encouraged moist heat and gentle stretching as tolerated. May try NSAIDs and prescription meds as directed and report if symptoms worsen or seek immediate care. Xray confirms some degenerative changes. Given refill on Zanaflex. Seek care if worsens

## 2017-09-23 NOTE — Assessment & Plan Note (Signed)
Encouraged to get adequate exercise, calcium and vitamin d intake 

## 2017-09-23 NOTE — Assessment & Plan Note (Signed)
Encouraged heart healthy diet, increase exercise, avoid trans fats, consider a krill oil cap daily 

## 2017-10-07 ENCOUNTER — Encounter: Payer: Self-pay | Admitting: Hematology

## 2017-10-11 ENCOUNTER — Encounter: Payer: Self-pay | Admitting: Family Medicine

## 2017-10-14 ENCOUNTER — Other Ambulatory Visit: Payer: Self-pay | Admitting: Hematology

## 2017-10-14 DIAGNOSIS — Z853 Personal history of malignant neoplasm of breast: Secondary | ICD-10-CM

## 2017-10-15 ENCOUNTER — Ambulatory Visit: Payer: Medicare HMO | Admitting: Family Medicine

## 2017-10-18 ENCOUNTER — Encounter: Payer: Self-pay | Admitting: Family Medicine

## 2017-10-18 ENCOUNTER — Other Ambulatory Visit: Payer: Self-pay

## 2017-10-18 DIAGNOSIS — S299XXA Unspecified injury of thorax, initial encounter: Secondary | ICD-10-CM

## 2017-10-18 NOTE — Telephone Encounter (Signed)
Requesting: LORAZEPAM  Contract: NO UDS: NO Last OV: 09/19/2017 Next OV: Not scheduled Last Refill: 03/11/2017 #[id number]   Please advise

## 2017-10-21 ENCOUNTER — Ambulatory Visit: Payer: Medicare HMO | Admitting: Internal Medicine

## 2017-10-22 MED ORDER — LORAZEPAM 1 MG PO TABS: 1.0000 mg | ORAL_TABLET | Freq: Three times a day (TID) | ORAL | 5 refills | Status: DC | PRN

## 2017-10-22 NOTE — Telephone Encounter (Signed)
OK to refill but needs UDS and contract

## 2017-10-24 ENCOUNTER — Encounter: Payer: Self-pay | Admitting: Family Medicine

## 2017-10-26 ENCOUNTER — Encounter: Payer: Self-pay | Admitting: Internal Medicine

## 2017-10-28 NOTE — Telephone Encounter (Signed)
Duplicate message- see mychart message from 10/18/2017.

## 2017-10-30 ENCOUNTER — Encounter: Payer: Self-pay | Admitting: Internal Medicine

## 2017-10-31 ENCOUNTER — Encounter: Payer: Self-pay | Admitting: Internal Medicine

## 2017-10-31 ENCOUNTER — Telehealth: Payer: Self-pay

## 2017-10-31 NOTE — Telephone Encounter (Signed)
Left message for patient to call back  

## 2017-10-31 NOTE — Telephone Encounter (Signed)
-----   Message from Gatha Mayer, MD sent at 10/31/2017 10:21 AM EST ----- Regarding: needs a phone call Sent me a My Chart message yesterday with 2-3 issues and I said we needed to call/triage  Pill issues not a major thing but bowel issues are  She is going to need to be seen sometime and somehow

## 2017-11-01 ENCOUNTER — Telehealth: Payer: Self-pay | Admitting: Family Medicine

## 2017-11-01 NOTE — Telephone Encounter (Signed)
Copied from [place] [id number]. Topic: Quick Communication - See Telephone Encounter >> Nov 01, 2017  1:36 PM Bea Graff, NT wrote: CRM for notification. See Telephone encounter for: Pt needing a rx of Lipitor written as 2 tablets 20mg  each instead of 1 40mg  tablet. This is hard to swallow and hurts her stomach. Use [facility] pharmacy in [place]. Pt has been without this medicine for 2 weeks  11/01/17.

## 2017-11-01 NOTE — Telephone Encounter (Signed)
Left message for patient to call back  

## 2017-11-02 ENCOUNTER — Other Ambulatory Visit: Payer: Self-pay | Admitting: Family Medicine

## 2017-11-04 MED ORDER — ATORVASTATIN CALCIUM 20 MG PO TABS: 20.0000 mg | ORAL_TABLET | Freq: Two times a day (BID) | ORAL | 3 refills | Status: DC

## 2017-11-04 NOTE — Telephone Encounter (Signed)
Left another message for the patient

## 2017-11-04 NOTE — Telephone Encounter (Signed)
Medication was sent to pharmacy.

## 2017-11-09 ENCOUNTER — Other Ambulatory Visit: Payer: Self-pay | Admitting: Hematology

## 2017-11-09 ENCOUNTER — Telehealth: Payer: Self-pay | Admitting: Hematology

## 2017-11-09 DIAGNOSIS — Z853 Personal history of malignant neoplasm of breast: Secondary | ICD-10-CM

## 2017-11-15 ENCOUNTER — Ambulatory Visit: Payer: Self-pay | Admitting: Plastic Surgery

## 2017-11-15 DIAGNOSIS — Z9011 Acquired absence of right breast and nipple: Secondary | ICD-10-CM | POA: Diagnosis not present

## 2017-11-15 DIAGNOSIS — N651 Disproportion of reconstructed breast: Secondary | ICD-10-CM | POA: Diagnosis not present

## 2017-11-15 DIAGNOSIS — Z9889 Other specified postprocedural states: Secondary | ICD-10-CM | POA: Diagnosis not present

## 2017-11-15 NOTE — H&P (View-Only) (Signed)
Anne [name] is an 77 y.o. female.   Chief Complaint: breast asymmetry HPI: The patient is a 77 y.o. yrs old wf here with her husband for re-evaluation of her breasts.  She underwent a right mastectomy in 2001 with reconstruction.  The implant is slightly high and large.  She underwent a left reduction 05/2017.  She now has volume loss on the superior aspect of the left breast with bottoming out.  She is interested in better symmetry.   She does not want an implant at this time.  History: RIGHT breast lobular carcinoma in 2001.  She had a lumpectomy and 4 nodes excised followed by radiation.  She had cancer on the RIGHT again in 2008 and underwent a mastectomy for   invasive lobular carcinoma, ER/PR positive, HER 2 positive. 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  She had a skin recurrence with was treated with chemo.  She had a saline implant reconstruction on the right and left mastopexy on the left in 2009.  She is 5 feet 6 inches tall, weighs 127 pounds.  Preop bra = 34 D and postop = 34 D.  She had saline removed from the right implant which now shows rippling.  Past Medical History:  Diagnosis Date  . Abdominal pain 08/11/2017  . ALKALINE PHOSPHATASE, ELEVATED 03/15/2009  . Allergic state 06/10/2012  . Anemia   . Anxiety   . Anxiety and depression 04/28/2011  . Arthritis   . Atypical chest pain 11/30/2011  . 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 Baker's cyst of knee 05/22/2011  . Cancer ([place]) 01,  08   XRT/chemo 01-02/ lobular invasive ca  . Carotid artery disease ([place])    a. Carotid duplex 03/2014: stable 1-39% BICA, f/u due 03/2016.  [name] Chronic alcoholism in remission ([place]) 03/29/2011   Did not tolerate Klonopin, caused some confusion and bad dreams.    [name] COPD (chronic obstructive pulmonary disease) ([place])   . Dermatitis 11/23/2012  . Dysphagia   . EE (eosinophilic  esophagitis)   . Elevated sed rate 08/02/2013  . Esophageal ring   . ESOPHAGEAL STRICTURE 03/29/2009  . Fall 11/23/2012  . Family history of breast cancer   . Family history of colon cancer   . Family history of ovarian cancer   . Family history of pancreatic cancer   . Folliculitis of nose 0/08/2724  . GERD (gastroesophageal reflux disease) 09/29/2009   improved s/p cholecystectomy and esophagus dilatation  . Hematuria   . History of chicken pox   . History of measles   . History of shingles    2 episodes  . Hx of echocardiogram    a. Echo 01/2013: mild LVH, EF 55-60%, normal wall motion, Gr 1 diast dysfn  . Hyperlipidemia   . Knee pain, bilateral 07/23/2011  . Medicare annual wellness visit, subsequent 06/19/2015  . Mixed hyperlipidemia 10/17/2010   Qualifier: Diagnosis of  By: Mack Guise    . Orthostasis   . Osteopenia 03/14/2011  . Personal history of chemotherapy 2001  . Personal history of radiation therapy 2001   rt breast  . 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 Pneumonia 2-10   pleurisy  . PVC's (premature ventricular contractions)    a. Event monitor 01/2013: NSR, extensive PVCs.  . Radial neck fracture 10/2011   minimally displaced  . Sinusitis, acute 12/04/2015  . Trauma 10/18/2011  .  URI (upper respiratory infection) 09/02/2012  . Urinary frequency 12/12/2014  . Urinary incontinence 03/19/2012  . Vaginitis 05/22/2011    Past Surgical History:  Procedure Laterality Date  . ANTERIOR CRUCIATE LIGAMENT REPAIR  08, 09, 10  . AUGMENTATION MAMMAPLASTY Right 05/27/2007  . BREAST BIOPSY Right 01/02/2007   wire loc  . BREAST BIOPSY  12/26/2006  . BREAST LUMPECTOMY Right 2001  . BREAST RECONSTRUCTION  2008, 2009, 2010  . BREAST REDUCTION WITH MASTOPEXY Left 05/30/2017   Procedure: LEFT BREAST REDUCTION FOR SYMTERY WITH MASTOPEXY;  Surgeon: Wallace Going, DO;  Location: [facility];  Service: Plastics;  Laterality: Left;  . CHOLECYSTECTOMY  2010  .  COLONOSCOPY  09/05/10   cecal avm's  . DENTAL SURGERY  05/2016   4 dental implants by Dr. Loyal Gambler.  [name] ERCP  2010    CBD stone extraction   . ESOPHAGOGASTRODUODENOSCOPY  01/08/2012   Procedure: ESOPHAGOGASTRODUODENOSCOPY (EGD);  Surgeon: Gatha Mayer, MD;  Location: [facility];  Service: Endoscopy;  Laterality: N/A;  . ESOPHAGOGASTRODUODENOSCOPY (EGD) WITH ESOPHAGEAL DILATION  2010, 2012  . LAPAROSCOPIC APPENDECTOMY N/A 08/04/2016   Procedure: APPENDECTOMY LAPAROSCOPIC;  Surgeon: Michael Boston, MD;  Location: [facility];  Service: General;  Laterality: N/A;  . MASTECTOMY MODIFIED RADICAL Right 05/27/2007   , Mastectomy modified radical (08), breast reconstruction, CA lesions excised lateral abd wall 2010  . SAVORY DILATION  01/08/2012   Procedure: SAVORY DILATION;  Surgeon: Gatha Mayer, MD;  Location: [facility];  Service: Endoscopy;  Laterality: N/A;  need xray    Family History  Problem Relation Age of Onset  . Heart disease Father   . Lung cancer Father        smoker  . Cirrhosis Sister        Primary Biliary  . Stroke Maternal Grandmother   . Alcohol abuse Maternal Grandfather   . Heart disease Paternal Grandfather   . Anxiety disorder Sister   . Osteoporosis Sister   . Arthritis Sister        Rheumatoid  . Osteoporosis Sister   . Skin cancer Sister        multiple skin cancers, over 77 excisions.  . Other Mother        tic [place]  . Breast cancer Maternal Aunt        dx in her 70s  . Breast cancer Paternal Aunt        dx in her 64s  . Pancreatic cancer Maternal Uncle        dx in his 59s; smoker  . Breast cancer Maternal Aunt        dx in her 71s  . Breast cancer Maternal Aunt        possible breast cancer dx and died in her 53s  . Ovarian cancer Maternal Aunt 29  . Pancreatic cancer Maternal Aunt        dx in her 53s  . Stomach cancer Maternal Uncle   . Colon cancer Maternal Uncle   . Lung cancer Paternal Aunt   . Breast cancer Cousin        paternal  first cousin  . Breast cancer Cousin        maternal first cousin  . Anesthesia problems Neg Hx   . Hypotension Neg Hx   . Malignant hyperthermia Neg Hx   . Pseudochol deficiency Neg Hx    Social History:  reports that she quit smoking about 10 years ago. Her smoking use  included cigarettes. She has a 50.00 pack-year smoking history. she has never used smokeless tobacco. She reports that she drinks alcohol. She reports that she does not use drugs.  Allergies:  Allergies  Allergen Reactions  . Codeine Other (See Comments)    "flu like symptoms"  . Morphine Other (See Comments)    "flu like symptoms"  . Peanut-Containing Drug Products Hives  . Sorbitol Other (See Comments)    GI Issues  . Tomato Diarrhea  . Zofran Other (See Comments)    headache  . Advil [Ibuprofen] Other (See Comments)    Irritates throat.  . Diphenhydramine Hcl Other (See Comments)    "nervous and upset"  . Oxycodone Anxiety    Confusion with inability to think clearly     (Not in a hospital admission)  No results found for this or any previous visit (from the past 48 hour(s)). No results found.  Review of Systems  Constitutional: Negative.   HENT: Negative.   Eyes: Negative.   Respiratory: Negative.   Cardiovascular: Negative.   Gastrointestinal: Negative.   Genitourinary: Negative.   Musculoskeletal: Negative.   Skin: Negative.   Neurological: Negative.   Psychiatric/Behavioral: Negative.     There were no vitals taken for this visit. Physical Exam  Constitutional: She appears well-developed and well-nourished.  HENT:  Head: Normocephalic and atraumatic.  Eyes: EOM are normal. Pupils are equal, round, and reactive to light.  Cardiovascular: Normal rate.  Respiratory: Effort normal.  GI: Soft.  Neurological: She is alert.  Skin: Skin is warm.     Assessment/Plan Plan for mastopexy / reduction revision of the left breast with [place], DO 11/15/2017, 3:56  PM

## 2017-11-15 NOTE — H&P (Signed)
Anne [name] is an 77 y.o. female.   Chief Complaint: breast asymmetry HPI: The patient is a 77 y.o. yrs old wf here with her husband for re-evaluation of her breasts.  She underwent a right mastectomy in 2001 with reconstruction.  The implant is slightly high and large.  She underwent a left reduction 05/2017.  She now has volume loss on the superior aspect of the left breast with bottoming out.  She is interested in better symmetry.   She does not want an implant at this time.  History: RIGHT breast lobular carcinoma in 2001.  She had a lumpectomy and 4 nodes excised followed by radiation.  She had cancer on the RIGHT again in 2008 and underwent a mastectomy for   invasive lobular carcinoma, ER/PR positive, HER 2 positive. 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  She had a skin recurrence with was treated with chemo.  She had a saline implant reconstruction on the right and left mastopexy on the left in 2009.  She is 5 feet 6 inches tall, weighs 127 pounds.  Preop bra = 34 D and postop = 34 D.  She had saline removed from the right implant which now shows rippling.  Past Medical History:  Diagnosis Date  . Abdominal pain 08/11/2017  . ALKALINE PHOSPHATASE, ELEVATED 03/15/2009  . Allergic state 06/10/2012  . Anemia   . Anxiety   . Anxiety and depression 04/28/2011  . Arthritis   . Atypical chest pain 11/30/2011  . 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 Baker's cyst of knee 05/22/2011  . Cancer ([place]) 01,  08   XRT/chemo 01-02/ lobular invasive ca  . Carotid artery disease ([place])    a. Carotid duplex 03/2014: stable 1-39% BICA, f/u due 03/2016.  [name] Chronic alcoholism in remission ([place]) 03/29/2011   Did not tolerate Klonopin, caused some confusion and bad dreams.    [name] COPD (chronic obstructive pulmonary disease) ([place])   . Dermatitis 11/23/2012  . Dysphagia   . EE (eosinophilic  esophagitis)   . Elevated sed rate 08/02/2013  . Esophageal ring   . ESOPHAGEAL STRICTURE 03/29/2009  . Fall 11/23/2012  . Family history of breast cancer   . Family history of colon cancer   . Family history of ovarian cancer   . Family history of pancreatic cancer   . Folliculitis of nose 7/67/3419  . GERD (gastroesophageal reflux disease) 09/29/2009   improved s/p cholecystectomy and esophagus dilatation  . Hematuria   . History of chicken pox   . History of measles   . History of shingles    2 episodes  . Hx of echocardiogram    a. Echo 01/2013: mild LVH, EF 55-60%, normal wall motion, Gr 1 diast dysfn  . Hyperlipidemia   . Knee pain, bilateral 07/23/2011  . Medicare annual wellness visit, subsequent 06/19/2015  . Mixed hyperlipidemia 10/17/2010   Qualifier: Diagnosis of  By: Mack Guise    . Orthostasis   . Osteopenia 03/14/2011  . Personal history of chemotherapy 2001  . Personal history of radiation therapy 2001   rt breast  . 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 Pneumonia 2-10   pleurisy  . PVC's (premature ventricular contractions)    a. Event monitor 01/2013: NSR, extensive PVCs.  . Radial neck fracture 10/2011   minimally displaced  . Sinusitis, acute 12/04/2015  . Trauma 10/18/2011  .  URI (upper respiratory infection) 09/02/2012  . Urinary frequency 12/12/2014  . Urinary incontinence 03/19/2012  . Vaginitis 05/22/2011    Past Surgical History:  Procedure Laterality Date  . ANTERIOR CRUCIATE LIGAMENT REPAIR  08, 09, 10  . AUGMENTATION MAMMAPLASTY Right 05/27/2007  . BREAST BIOPSY Right 01/02/2007   wire loc  . BREAST BIOPSY  12/26/2006  . BREAST LUMPECTOMY Right 2001  . BREAST RECONSTRUCTION  2008, 2009, 2010  . BREAST REDUCTION WITH MASTOPEXY Left 05/30/2017   Procedure: LEFT BREAST REDUCTION FOR SYMTERY WITH MASTOPEXY;  Surgeon: Wallace Going, DO;  Location: [facility];  Service: Plastics;  Laterality: Left;  . CHOLECYSTECTOMY  2010  .  COLONOSCOPY  09/05/10   cecal avm's  . DENTAL SURGERY  05/2016   4 dental implants by Dr. Loyal Gambler.  [name] ERCP  2010    CBD stone extraction   . ESOPHAGOGASTRODUODENOSCOPY  01/08/2012   Procedure: ESOPHAGOGASTRODUODENOSCOPY (EGD);  Surgeon: Gatha Mayer, MD;  Location: [facility];  Service: Endoscopy;  Laterality: N/A;  . ESOPHAGOGASTRODUODENOSCOPY (EGD) WITH ESOPHAGEAL DILATION  2010, 2012  . LAPAROSCOPIC APPENDECTOMY N/A 08/04/2016   Procedure: APPENDECTOMY LAPAROSCOPIC;  Surgeon: Michael Boston, MD;  [facility];  Service: General;  Laterality: N/A;  . MASTECTOMY MODIFIED RADICAL Right 05/27/2007   , Mastectomy modified radical (08), breast reconstruction, CA lesions excised lateral abd wall 2010  . SAVORY DILATION  01/08/2012   Procedure: SAVORY DILATION;  Surgeon: Gatha Mayer, MD;  [facility];  Service: Endoscopy;  Laterality: N/A;  need xray    Family History  Problem Relation Age of Onset  . Heart disease Father   . Lung cancer Father        smoker  . Cirrhosis Sister        Primary Biliary  . Stroke Maternal Grandmother   . Alcohol abuse Maternal Grandfather   . Heart disease Paternal Grandfather   . Anxiety disorder Sister   . Osteoporosis Sister   . Arthritis Sister        Rheumatoid  . Osteoporosis Sister   . Skin cancer Sister        multiple skin cancers, over 6 excisions.  . Other Mother        tic [place]  . Breast cancer Maternal Aunt        dx in her 32s  . Breast cancer Paternal Aunt        dx in her 31s  . Pancreatic cancer Maternal Uncle        dx in his 13s; smoker  . Breast cancer Maternal Aunt        dx in her 30s  . Breast cancer Maternal Aunt        possible breast cancer dx and died in her 82s  . Ovarian cancer Maternal Aunt 29  . Pancreatic cancer Maternal Aunt        dx in her 76s  . Stomach cancer Maternal Uncle   . Colon cancer Maternal Uncle   . Lung cancer Paternal Aunt   . Breast cancer Cousin        paternal  first cousin  . Breast cancer Cousin        maternal first cousin  . Anesthesia problems Neg Hx   . Hypotension Neg Hx   . Malignant hyperthermia Neg Hx   . Pseudochol deficiency Neg Hx    Social History:  reports that she quit smoking about 10 years ago. Her smoking use  included cigarettes. She has a 50.00 pack-year smoking history. she has never used smokeless tobacco. She reports that she drinks alcohol. She reports that she does not use drugs.  Allergies:  Allergies  Allergen Reactions  . Codeine Other (See Comments)    "flu like symptoms"  . Morphine Other (See Comments)    "flu like symptoms"  . Peanut-Containing Drug Products Hives  . Sorbitol Other (See Comments)    GI Issues  . Tomato Diarrhea  . Zofran Other (See Comments)    headache  . Advil [Ibuprofen] Other (See Comments)    Irritates throat.  . Diphenhydramine Hcl Other (See Comments)    "nervous and upset"  . Oxycodone Anxiety    Confusion with inability to think clearly     (Not in a hospital admission)  No results found for this or any previous visit (from the past 48 hour(s)). No results found.  Review of Systems  Constitutional: Negative.   HENT: Negative.   Eyes: Negative.   Respiratory: Negative.   Cardiovascular: Negative.   Gastrointestinal: Negative.   Genitourinary: Negative.   Musculoskeletal: Negative.   Skin: Negative.   Neurological: Negative.   Psychiatric/Behavioral: Negative.     There were no vitals taken for this visit. Physical Exam  Constitutional: She appears well-developed and well-nourished.  HENT:  Head: Normocephalic and atraumatic.  Eyes: EOM are normal. Pupils are equal, round, and reactive to light.  Cardiovascular: Normal rate.  Respiratory: Effort normal.  GI: Soft.  Neurological: She is alert.  Skin: Skin is warm.     Assessment/Plan Plan for mastopexy / reduction revision of the left breast with [place], DO 11/15/2017, 3:56  PM

## 2017-11-19 ENCOUNTER — Other Ambulatory Visit: Payer: Self-pay

## 2017-11-19 ENCOUNTER — Encounter (HOSPITAL_BASED_OUTPATIENT_CLINIC_OR_DEPARTMENT_OTHER): Payer: Self-pay | Admitting: *Deleted

## 2017-11-20 ENCOUNTER — Other Ambulatory Visit: Payer: Self-pay

## 2017-11-21 ENCOUNTER — Encounter (HOSPITAL_BASED_OUTPATIENT_CLINIC_OR_DEPARTMENT_OTHER)
Admission: RE | Disposition: A | Discharge status: Home / Self Care | Payer: Self-pay | Source: Ambulatory Visit | Attending: Plastic Surgery

## 2017-11-21 ENCOUNTER — Ambulatory Visit (HOSPITAL_BASED_OUTPATIENT_CLINIC_OR_DEPARTMENT_OTHER): Payer: PPO | Admitting: Anesthesiology

## 2017-11-21 ENCOUNTER — Other Ambulatory Visit: Payer: Self-pay

## 2017-11-21 ENCOUNTER — Encounter (HOSPITAL_BASED_OUTPATIENT_CLINIC_OR_DEPARTMENT_OTHER): Payer: Self-pay

## 2017-11-21 ENCOUNTER — Ambulatory Visit (HOSPITAL_BASED_OUTPATIENT_CLINIC_OR_DEPARTMENT_OTHER)
Admission: RE | Admit: 2017-11-21 | Discharge: 2017-11-21 | Disposition: A | Discharge status: Home / Self Care | Payer: PPO | Source: Ambulatory Visit | Attending: Plastic Surgery | Admitting: Plastic Surgery

## 2017-11-21 DIAGNOSIS — Z9221 Personal history of antineoplastic chemotherapy: Secondary | ICD-10-CM | POA: Insufficient documentation

## 2017-11-21 DIAGNOSIS — Z9011 Acquired absence of right breast and nipple: Secondary | ICD-10-CM | POA: Diagnosis not present

## 2017-11-21 DIAGNOSIS — Z853 Personal history of malignant neoplasm of breast: Secondary | ICD-10-CM | POA: Insufficient documentation

## 2017-11-21 DIAGNOSIS — J449 Chronic obstructive pulmonary disease, unspecified: Secondary | ICD-10-CM | POA: Diagnosis not present

## 2017-11-21 DIAGNOSIS — I10 Essential (primary) hypertension: Secondary | ICD-10-CM | POA: Diagnosis not present

## 2017-11-21 DIAGNOSIS — F419 Anxiety disorder, unspecified: Secondary | ICD-10-CM | POA: Diagnosis not present

## 2017-11-21 DIAGNOSIS — Z87891 Personal history of nicotine dependence: Secondary | ICD-10-CM | POA: Diagnosis not present

## 2017-11-21 DIAGNOSIS — N6489 Other specified disorders of breast: Secondary | ICD-10-CM | POA: Insufficient documentation

## 2017-11-21 DIAGNOSIS — Z1501 Genetic susceptibility to malignant neoplasm of breast: Secondary | ICD-10-CM | POA: Insufficient documentation

## 2017-11-21 DIAGNOSIS — N651 Disproportion of reconstructed breast: Secondary | ICD-10-CM | POA: Diagnosis not present

## 2017-11-21 DIAGNOSIS — F329 Major depressive disorder, single episode, unspecified: Secondary | ICD-10-CM | POA: Insufficient documentation

## 2017-11-21 HISTORY — PX: MASTOPEXY: SHX5358

## 2017-11-21 HISTORY — PX: LIPOSUCTION WITH LIPOFILLING: SHX6436

## 2017-11-21 SURGERY — MASTOPEXY
Anesthesia: General | Site: Breast | Laterality: Left

## 2017-11-21 MED ORDER — LIDOCAINE HCL (CARDIAC) 20 MG/ML IV SOLN
INTRAVENOUS | Status: DC | PRN
  Administered 2017-11-21: 10 mg via INTRAVENOUS

## 2017-11-21 MED ORDER — PROPOFOL 10 MG/ML IV BOLUS
INTRAVENOUS | Status: DC | PRN
  Administered 2017-11-21: 100 mg via INTRAVENOUS

## 2017-11-21 MED ORDER — ACETAMINOPHEN 650 MG RE SUPP: 650.0000 mg | RECTAL | Status: DC | PRN

## 2017-11-21 MED ORDER — SODIUM CHLORIDE 0.9 % IV SOLN: 250.0000 mL | INTRAVENOUS | Status: DC | PRN

## 2017-11-21 MED ORDER — SODIUM CHLORIDE 0.9% FLUSH: 3.0000 mL | Freq: Two times a day (BID) | INTRAVENOUS | Status: DC

## 2017-11-21 MED ORDER — LACTATED RINGERS IV SOLN
INTRAVENOUS | Status: DC
  Administered 2017-11-21 (×2): via INTRAVENOUS

## 2017-11-21 MED ORDER — LIDOCAINE HCL 1 % IJ SOLN
INTRAVENOUS | Status: DC | PRN
  Administered 2017-11-21: 800 mL

## 2017-11-21 MED ORDER — HYDROMORPHONE HCL 1 MG/ML IJ SOLN
INTRAMUSCULAR | Status: AC
  Filled 2017-11-21: qty 0.5

## 2017-11-21 MED ORDER — EPINEPHRINE 30 MG/30ML IJ SOLN
INTRAMUSCULAR | Status: AC
  Filled 2017-11-21: qty 1

## 2017-11-21 MED ORDER — DEXAMETHASONE SODIUM PHOSPHATE 4 MG/ML IJ SOLN
INTRAMUSCULAR | Status: DC | PRN
  Administered 2017-11-21: 8 mg via INTRAVENOUS

## 2017-11-21 MED ORDER — MEPERIDINE HCL 25 MG/ML IJ SOLN: 6.2500 mg | INTRAMUSCULAR | Status: DC | PRN

## 2017-11-21 MED ORDER — PROPOFOL 500 MG/50ML IV EMUL
INTRAVENOUS | Status: AC
  Filled 2017-11-21: qty 50

## 2017-11-21 MED ORDER — LIDOCAINE 2% (20 MG/ML) 5 ML SYRINGE
INTRAMUSCULAR | Status: AC
  Filled 2017-11-21: qty 5

## 2017-11-21 MED ORDER — BUPIVACAINE-EPINEPHRINE 0.25% -1:200000 IJ SOLN
INTRAMUSCULAR | Status: AC
  Filled 2017-11-21: qty 1

## 2017-11-21 MED ORDER — ACETAMINOPHEN 325 MG PO TABS: 650.0000 mg | ORAL_TABLET | ORAL | Status: DC | PRN

## 2017-11-21 MED ORDER — ROCURONIUM BROMIDE 10 MG/ML (PF) SYRINGE
PREFILLED_SYRINGE | INTRAVENOUS | Status: AC
  Filled 2017-11-21: qty 5

## 2017-11-21 MED ORDER — CEFAZOLIN SODIUM-DEXTROSE 2-4 GM/100ML-% IV SOLN
2.0000 g | INTRAVENOUS | Status: AC
  Administered 2017-11-21: 2 g via INTRAVENOUS

## 2017-11-21 MED ORDER — CEFAZOLIN SODIUM-DEXTROSE 2-4 GM/100ML-% IV SOLN: 2.0000 g | INTRAVENOUS | Status: DC

## 2017-11-21 MED ORDER — MIDAZOLAM HCL 2 MG/2ML IJ SOLN: 0.5000 mg | Freq: Once | INTRAMUSCULAR | Status: DC | PRN

## 2017-11-21 MED ORDER — PROMETHAZINE HCL 25 MG/ML IJ SOLN: 6.2500 mg | INTRAMUSCULAR | Status: DC | PRN

## 2017-11-21 MED ORDER — ONDANSETRON HCL 4 MG/2ML IJ SOLN
INTRAMUSCULAR | Status: AC
  Filled 2017-11-21: qty 2

## 2017-11-21 MED ORDER — FENTANYL CITRATE (PF) 100 MCG/2ML IJ SOLN
INTRAMUSCULAR | Status: AC
  Filled 2017-11-21: qty 2

## 2017-11-21 MED ORDER — LIDOCAINE HCL (PF) 1 % IJ SOLN
INTRAMUSCULAR | Status: AC
  Filled 2017-11-21: qty 30

## 2017-11-21 MED ORDER — SODIUM CHLORIDE 0.9% FLUSH: 3.0000 mL | INTRAVENOUS | Status: DC | PRN

## 2017-11-21 MED ORDER — PHENYLEPHRINE HCL 10 MG/ML IJ SOLN
INTRAMUSCULAR | Status: DC | PRN
  Administered 2017-11-21: 40 ug via INTRAVENOUS

## 2017-11-21 MED ORDER — FENTANYL CITRATE (PF) 100 MCG/2ML IJ SOLN
50.0000 ug | INTRAMUSCULAR | Status: DC | PRN
  Administered 2017-11-21: 50 ug via INTRAVENOUS

## 2017-11-21 MED ORDER — SCOPOLAMINE 1 MG/3DAYS TD PT72: 1.0000 | MEDICATED_PATCH | Freq: Once | TRANSDERMAL | Status: DC | PRN

## 2017-11-21 MED ORDER — PHENYLEPHRINE 40 MCG/ML (10ML) SYRINGE FOR IV PUSH (FOR BLOOD PRESSURE SUPPORT)
PREFILLED_SYRINGE | INTRAVENOUS | Status: AC
  Filled 2017-11-21: qty 10

## 2017-11-21 MED ORDER — BUPIVACAINE-EPINEPHRINE 0.25% -1:200000 IJ SOLN
INTRAMUSCULAR | Status: DC | PRN
  Administered 2017-11-21: 15 mL

## 2017-11-21 MED ORDER — MIDAZOLAM HCL 2 MG/2ML IJ SOLN
1.0000 mg | INTRAMUSCULAR | Status: DC | PRN
  Administered 2017-11-21: 1 mg via INTRAVENOUS

## 2017-11-21 MED ORDER — SODIUM CHLORIDE 0.9 % IJ SOLN
INTRAMUSCULAR | Status: AC
  Filled 2017-11-21: qty 30

## 2017-11-21 MED ORDER — EPHEDRINE SULFATE 50 MG/ML IJ SOLN
INTRAMUSCULAR | Status: DC | PRN
  Administered 2017-11-21 (×2): 10 mg via INTRAVENOUS

## 2017-11-21 MED ORDER — EPHEDRINE 5 MG/ML INJ
INTRAVENOUS | Status: AC
  Filled 2017-11-21: qty 10

## 2017-11-21 MED ORDER — DEXAMETHASONE SODIUM PHOSPHATE 10 MG/ML IJ SOLN
INTRAMUSCULAR | Status: AC
  Filled 2017-11-21: qty 1

## 2017-11-21 MED ORDER — LIDOCAINE HCL (PF) 1 % IJ SOLN
INTRAMUSCULAR | Status: AC
Start: 2017-11-21 — End: 2017-11-21
  Filled 2017-11-21: qty 30

## 2017-11-21 MED ORDER — AMITRIPTYLINE HCL 150 MG PO TABS: 150.0000 mg | ORAL_TABLET | Freq: Every day | ORAL | 5 refills | Status: DC

## 2017-11-21 MED ORDER — HYDROMORPHONE HCL 1 MG/ML IJ SOLN
0.2500 mg | INTRAMUSCULAR | Status: DC | PRN
  Administered 2017-11-21: 0.25 mg via INTRAVENOUS

## 2017-11-21 MED ORDER — MIDAZOLAM HCL 2 MG/2ML IJ SOLN
INTRAMUSCULAR | Status: AC
  Filled 2017-11-21: qty 2

## 2017-11-21 MED ORDER — ONDANSETRON HCL 4 MG/2ML IJ SOLN
INTRAMUSCULAR | Status: DC | PRN
  Administered 2017-11-21: 4 mg via INTRAVENOUS

## 2017-11-21 MED ORDER — DEXTROSE 5 % IV SOLN
INTRAVENOUS | Status: DC | PRN
  Administered 2017-11-21: 40 ug/min via INTRAVENOUS

## 2017-11-21 MED ORDER — CEFAZOLIN SODIUM-DEXTROSE 2-4 GM/100ML-% IV SOLN
INTRAVENOUS | Status: AC
  Filled 2017-11-21: qty 100

## 2017-11-21 MED ORDER — SUCCINYLCHOLINE CHLORIDE 200 MG/10ML IV SOSY
PREFILLED_SYRINGE | INTRAVENOUS | Status: AC
  Filled 2017-11-21: qty 10

## 2017-11-21 SURGICAL SUPPLY — 80 items
ADH SKN CLS APL DERMABOND .7 (GAUZE/BANDAGES/DRESSINGS) ×3
BAG DECANTER FOR FLEXI CONT (MISCELLANEOUS) ×1 IMPLANT
BINDER ABDOMINAL 10 UNV 27-48 (MISCELLANEOUS) ×3 IMPLANT
BINDER BREAST LRG (GAUZE/BANDAGES/DRESSINGS) IMPLANT
BINDER BREAST MEDIUM (GAUZE/BANDAGES/DRESSINGS) ×3 IMPLANT
BINDER BREAST XLRG (GAUZE/BANDAGES/DRESSINGS) IMPLANT
BINDER BREAST XXLRG (GAUZE/BANDAGES/DRESSINGS) IMPLANT
BIOPATCH RED 1 DISK 7.0 (GAUZE/BANDAGES/DRESSINGS) IMPLANT
BLADE HEX COATED 2.75 (ELECTRODE) ×4 IMPLANT
BLADE KNIFE PERSONA 10 (BLADE) ×8 IMPLANT
BLADE SURG 15 STRL LF DISP TIS (BLADE) ×3 IMPLANT
BLADE SURG 15 STRL SS (BLADE) ×4
BNDG GAUZE ELAST 4 BULKY (GAUZE/BANDAGES/DRESSINGS) ×2 IMPLANT
CANISTER SUCT 1200ML W/VALVE (MISCELLANEOUS) ×4 IMPLANT
CHLORAPREP W/TINT 26ML (MISCELLANEOUS) ×4 IMPLANT
COVER BACK TABLE 60X90IN (DRAPES) ×4 IMPLANT
COVER MAYO STAND STRL (DRAPES) ×7 IMPLANT
DECANTER SPIKE VIAL GLASS SM (MISCELLANEOUS) IMPLANT
DERMABOND ADVANCED (GAUZE/BANDAGES/DRESSINGS) ×1
DERMABOND ADVANCED .7 DNX12 (GAUZE/BANDAGES/DRESSINGS) ×2 IMPLANT
DRAIN CHANNEL 19F RND (DRAIN) IMPLANT
DRAPE LAPAROSCOPIC ABDOMINAL (DRAPES) ×4 IMPLANT
DRSG PAD ABDOMINAL 8X10 ST (GAUZE/BANDAGES/DRESSINGS) ×8 IMPLANT
ELECT BLADE 4.0 EZ CLEAN MEGAD (MISCELLANEOUS) ×4
ELECT BLADE 6.5 .24CM SHAFT (ELECTRODE) IMPLANT
ELECT REM PT RETURN 9FT ADLT (ELECTROSURGICAL) ×4
ELECTRODE BLDE 4.0 EZ CLN MEGD (MISCELLANEOUS) ×3 IMPLANT
ELECTRODE REM PT RTRN 9FT ADLT (ELECTROSURGICAL) ×3 IMPLANT
EVACUATOR SILICONE 100CC (DRAIN) IMPLANT
EXTRACTOR CANIST REVOLVE STRL (CANNISTER) ×3 IMPLANT
GAUZE SPONGE 4X4 12PLY STRL LF (GAUZE/BANDAGES/DRESSINGS) ×1 IMPLANT
GLOVE BIO SURGEON STRL SZ 6.5 (GLOVE) ×12 IMPLANT
GLOVE BIO SURGEON STRL SZ7 (GLOVE) ×3 IMPLANT
GOWN STRL REUS W/ TWL LRG LVL3 (GOWN DISPOSABLE) ×5 IMPLANT
GOWN STRL REUS W/ TWL XL LVL3 (GOWN DISPOSABLE) ×2 IMPLANT
GOWN STRL REUS W/TWL LRG LVL3 (GOWN DISPOSABLE) ×4
GOWN STRL REUS W/TWL XL LVL3 (GOWN DISPOSABLE) ×4
IV NS 1000ML (IV SOLUTION)
IV NS 1000ML BAXH (IV SOLUTION) IMPLANT
IV NS 500ML (IV SOLUTION)
IV NS 500ML BAXH (IV SOLUTION) ×1 IMPLANT
KIT FILL SYSTEM UNIVERSAL (SET/KITS/TRAYS/PACK) IMPLANT
NDL HYPO 25X1 1.5 SAFETY (NEEDLE) ×1 IMPLANT
NDL SAFETY ECLIPSE 18X1.5 (NEEDLE) ×6 IMPLANT
NDL SPNL 18GX3.5 QUINCKE PK (NEEDLE) IMPLANT
NEEDLE HYPO 18GX1.5 SHARP (NEEDLE) ×12
NEEDLE HYPO 25X1 1.5 SAFETY (NEEDLE) ×4 IMPLANT
NEEDLE SPNL 18GX3.5 QUINCKE PK (NEEDLE) IMPLANT
NS IRRIG 1000ML POUR BTL (IV SOLUTION) ×3 IMPLANT
PACK BASIN DAY SURGERY FS (CUSTOM PROCEDURE TRAY) ×4 IMPLANT
PAD ALCOHOL SWAB (MISCELLANEOUS) IMPLANT
PENCIL BUTTON HOLSTER BLD 10FT (ELECTRODE) ×4 IMPLANT
PIN SAFETY STERILE (MISCELLANEOUS) IMPLANT
SLEEVE SCD COMPRESS KNEE MED (MISCELLANEOUS) ×4 IMPLANT
SPONGE LAP 18X18 X RAY DECT (DISPOSABLE) ×8 IMPLANT
STRIP SUTURE WOUND CLOSURE 1/2 (SUTURE) ×2 IMPLANT
SUT MNCRL AB 4-0 PS2 18 (SUTURE) ×8 IMPLANT
SUT MON AB 3-0 SH 27 (SUTURE) ×4
SUT MON AB 3-0 SH27 (SUTURE) ×3 IMPLANT
SUT MON AB 5-0 PS2 18 (SUTURE) ×8 IMPLANT
SUT PDS 3-0 CT2 (SUTURE)
SUT PDS AB 2-0 CT2 27 (SUTURE) IMPLANT
SUT PDS II 3-0 CT2 27 ABS (SUTURE) IMPLANT
SUT SILK 3 0 PS 1 (SUTURE) IMPLANT
SUT VIC AB 3-0 SH 27 (SUTURE)
SUT VIC AB 3-0 SH 27X BRD (SUTURE) IMPLANT
SUT VICRYL 4-0 PS2 18IN ABS (SUTURE) IMPLANT
SYR 3ML 23GX1 SAFETY (SYRINGE) ×4 IMPLANT
SYR 50ML LL SCALE MARK (SYRINGE) ×3 IMPLANT
SYR 50ML SLIP (SYRINGE) ×6 IMPLANT
SYR BULB IRRIGATION 50ML (SYRINGE) ×4 IMPLANT
SYR CONTROL 10ML LL (SYRINGE) ×4 IMPLANT
TAPE MEASURE VINYL STERILE (MISCELLANEOUS) ×1 IMPLANT
TOWEL GREEN STERILE FF (TOWEL DISPOSABLE) ×1 IMPLANT
TOWEL OR 17X24 6PK STRL BLUE (TOWEL DISPOSABLE) ×8 IMPLANT
TUBE CONNECTING 20X1/4 (TUBING) ×4 IMPLANT
TUBING INFILTRATION IT-10001 (TUBING) ×3 IMPLANT
TUBING SET GRADUATE ASPIR 12FT (MISCELLANEOUS) ×3 IMPLANT
UNDERPAD 30X30 (UNDERPADS AND DIAPERS) ×8 IMPLANT
YANKAUER SUCT BULB TIP NO VENT (SUCTIONS) ×4 IMPLANT

## 2017-11-21 NOTE — Anesthesia Preprocedure Evaluation (Addendum)
Anesthesia Evaluation  Patient identified by MRN, date of birth, ID band Patient awake    Reviewed: Allergy & Precautions, NPO status , Patient's Chart, lab work & pertinent test results  History of Anesthesia Complications Negative for: history of anesthetic complications  Airway Mallampati: II  TM Distance: >3 FB Neck ROM: Full    Dental  (+) Dental Advisory Given, Upper Dentures, Partial Lower, Implants   Pulmonary COPD, former smoker,    breath sounds clear to auscultation       Cardiovascular hypertension, Pt. on medications (-) angina Rhythm:Regular Rate:Normal  '14 Myvoview: normal  No ischemia or scar '14 ECHO: EF 55-60%, valves OK   Neuro/Psych Anxiety negative neurological ROS     GI/Hepatic Neg liver ROS, GERD  Medicated and Controlled,  Endo/Other  negative endocrine ROS  Renal/GU negative Renal ROS     Musculoskeletal  (+) Arthritis , Osteoarthritis,    Abdominal   Peds  Hematology negative hematology ROS (+)   Anesthesia Other Findings Breast cancer  Reproductive/Obstetrics                           Anesthesia Physical Anesthesia Plan  ASA: II  Anesthesia Plan: General   Post-op Pain Management:    Induction: Intravenous  PONV Risk Score and Plan: 3 and Ondansetron, Dexamethasone and Treatment may vary due to age or medical condition  Airway Management Planned: LMA  Additional Equipment:   Intra-op Plan:   Post-operative Plan:   Informed Consent: I have reviewed the patients History and Physical, chart, labs and discussed the procedure including the risks, benefits and alternatives for the proposed anesthesia with the patient or authorized representative who has indicated his/her understanding and acceptance.   Dental advisory given  Plan Discussed with: CRNA and Surgeon  Anesthesia Plan Comments: (Plan routine monitors, GA- LMA OK)        Anesthesia  Quick Evaluation

## 2017-11-21 NOTE — Transfer of Care (Signed)
Immediate Anesthesia Transfer of Care Note  Patient: Anne [name]  Procedure(s) Performed: LEFT MASTOPEXY  REVISION FOR SYMMETRY (Left ) LIPOSUCTION WITH LIPOFILLING (Left )  Patient Location: PACU  Anesthesia Type:General  Level of Consciousness: awake, alert , oriented and drowsy  Airway & Oxygen Therapy: Patient Spontanous Breathing and Patient connected to face mask oxygen  Post-op Assessment: Report given to RN and Post -op Vital signs reviewed and stable  Post vital signs: Reviewed and stable  Last Vitals:  Vitals:   11/21/17 0654 11/21/17 0940  BP: (!) 104/58 117/65  Pulse: 80 77  Resp: 20 15  Temp: 36.7 C   SpO2: 100% 100%    Last Pain:  Vitals:   11/21/17 0654  TempSrc: Oral         Complications: No apparent anesthesia complications

## 2017-11-21 NOTE — Discharge Instructions (Signed)
May shower tomorrow Continue breast binder and abdominal binder No heavy lifting   Post Anesthesia Home Care Instructions  Activity: Get plenty of rest for the remainder of the day. A responsible individual must stay with you for 24 hours following the procedure.  For the next 24 hours, DO NOT: -Drive a car -Paediatric nurse -Drink alcoholic beverages -Take any medication unless instructed by your physician -Make any legal decisions or sign important papers.  Meals: Start with liquid foods such as gelatin or soup. Progress to regular foods as tolerated. Avoid greasy, spicy, heavy foods. If nausea and/or vomiting occur, drink only clear liquids until the nausea and/or vomiting subsides. Call your physician if vomiting continues.  Special Instructions/Symptoms: Your throat may feel dry or sore from the anesthesia or the breathing tube placed in your throat during surgery. If this causes discomfort, gargle with warm salt water. The discomfort should disappear within 24 hours.  If you had a scopolamine patch placed behind your ear for the management of post- operative nausea and/or vomiting:  1. The medication in the patch is effective for 72 hours, after which it should be removed.  Wrap patch in a tissue and discard in the trash. Wash hands thoroughly with soap and water. 2. You may remove the patch earlier than 72 hours if you experience unpleasant side effects which may include dry mouth, dizziness or visual disturbances. 3. Avoid touching the patch. Wash your hands with soap and water after contact with the patch.

## 2017-11-21 NOTE — Op Note (Signed)
Op report Unilateral Breast Exchange   DATE OF OPERATION:  11/21/2017  LOCATION: [place]  SURGICAL DIVISION: Plastic Surgery  PREOPERATIVE DIAGNOSES:  1. History of right breast cancer.  2. Left Breast Asymmetry.   POSTOPERATIVE DIAGNOSES:  1. History of right breast cancer.  2. Left breast asymmetry.   PROCEDURE:  1. Left breast revision mastopexy. 2. Lipofilling left breast for symmetry.  SURGEON: Brigitt Mcclish [place] Monia Timmers, DO  ANESTHESIA:  General.   COMPLICATIONS: None.   INDICATIONS FOR PROCEDURE:  The patient, Anne [name], is a 77 y.o. female born on 09-22-1941, is here for treatment for further treatment after a right mastectomy and placement of implant for breast cancer. She now presents for symmetry surgery with the left breast smaller and volume loss of the upper poll. MRN: [id number]  CONSENT:  Informed consent was obtained directly from the patient. Risks, benefits and alternatives were fully discussed. Specific risks including but not limited to bleeding, infection, hematoma, seroma, scarring, pain, asymmetry, wound healing problems, and need for further surgery were all discussed. The patient did have an ample opportunity to have her questions answered to her satisfaction.   DESCRIPTION OF PROCEDURE:  The patient was marked in the preop holding area. The patient was taken to the operating room. SCDs were placed and IV antibiotics were given. The patient's chest was prepped and draped in a sterile fashion. A time out was performed. Local was used to infiltrate the lower left breast area and umbilical area.   The #15 blade was used to make a 5 mm incision in the skin under the umbilical area.  The tumescent was infused.  There was ~ 600 cc of tumescent placed in the abdominal fat.  Attention was moved to the left breast.  The #10 blade was used to excise the skin of the lower pol that was marked as an elipse.  The bovie was use to obtain  hemostasis.  Care was taken to not violate the pedicle. The deep layers were closed with the 3-0 Monocryl.  The skin was closed with the 4-0 and 5-0 Monocryl.    The liposuction was performed and 350 cc of fat removed.  The fat was prepared with the Revolve.  The cannula was used to place the fat in the left breast medial and upper poll area for 200 cc.  The umbilical incision was closed with 5-0 Monocryl. Dermabond was applied.  A breast binder and ABD was applied.  The patient was allowed to wake from anesthesia and taken to the recovery room in satisfactory condition.

## 2017-11-21 NOTE — Interval H&P Note (Signed)
History and Physical Interval Note:  11/21/2017 7:23 AM  Anne [name]  has presented today for surgery, with the diagnosis of history of right mastectomy  The various methods of treatment have been discussed with the patient and family. After consideration of risks, benefits and other options for treatment, the patient has consented to  Procedure(s): MAMMARY REDUCTION  (BREAST) LEFT REVISION FOR SYMETRY (Left) POSSIBLE PLACEMENT OF BREAST IMPLANT (Right) as a surgical intervention .  The patient's history has been reviewed, patient examined, no change in status, stable for surgery.  I have reviewed the patient's chart and labs.  Questions were answered to the patient's satisfaction.     Loel Lofty Dillingham

## 2017-11-21 NOTE — Anesthesia Procedure Notes (Signed)
Procedure Name: LMA Insertion Date/Time: 11/21/2017 7:59 AM Performed by: Willa Frater, CRNA Pre-anesthesia Checklist: Patient identified, Emergency Drugs available, Suction available and Patient being monitored Patient Re-evaluated:Patient Re-evaluated prior to induction Oxygen Delivery Method: Circle system utilized Preoxygenation: Pre-oxygenation with 100% oxygen Induction Type: IV induction Ventilation: Mask ventilation without difficulty LMA: LMA inserted LMA Size: 3.0 Number of attempts: 1 Airway Equipment and Method: Bite block Placement Confirmation: positive ETCO2 Tube secured with: Tape Dental Injury: Teeth and Oropharynx as per pre-operative assessment

## 2017-11-22 ENCOUNTER — Encounter (HOSPITAL_BASED_OUTPATIENT_CLINIC_OR_DEPARTMENT_OTHER): Payer: Self-pay | Admitting: Plastic Surgery

## 2017-11-22 NOTE — Anesthesia Postprocedure Evaluation (Signed)
Anesthesia Post Note  Patient: Anne [name]  Procedure(s) Performed: LEFT BREAST REVISION MASTOPEXY FOR SYMMETRY (Left Breast) LIPOSUCTION FROM ABDOMEN WITH LIPOFILLING TO LEFT BREAST (Left Breast)     Patient location during evaluation: PACU Anesthesia Type: General Level of consciousness: awake and alert, patient cooperative and oriented Pain management: pain level controlled Vital Signs Assessment: post-procedure vital signs reviewed and stable Respiratory status: spontaneous breathing, nonlabored ventilation and respiratory function stable Cardiovascular status: blood pressure returned to baseline and stable Postop Assessment: no apparent nausea or vomiting Anesthetic complications: no    Last Vitals:  Vitals:   11/21/17 1115 11/21/17 1145  BP: 103/63 (!) 117/59  Pulse: 81 85  Resp: (!) 21 18  Temp:  (!) 36.4 C  SpO2: 94% 94%    Last Pain:  Vitals:   11/21/17 1145  TempSrc:   PainSc: 2                  Seona Clemenson,E. Malek Skog

## 2017-12-11 ENCOUNTER — Encounter: Payer: Self-pay | Admitting: Family Medicine

## 2017-12-17 ENCOUNTER — Encounter: Payer: Self-pay | Admitting: *Deleted

## 2017-12-17 ENCOUNTER — Other Ambulatory Visit: Payer: Self-pay

## 2017-12-17 ENCOUNTER — Emergency Department (INDEPENDENT_AMBULATORY_CARE_PROVIDER_SITE_OTHER)
Admission: EM | Admit: 2017-12-17 | Discharge: 2017-12-17 | Disposition: A | Discharge status: Home / Self Care | Payer: PPO | Source: Home / Self Care | Attending: Family Medicine | Admitting: Family Medicine

## 2017-12-17 DIAGNOSIS — J111 Influenza due to unidentified influenza virus with other respiratory manifestations: Secondary | ICD-10-CM

## 2017-12-17 DIAGNOSIS — R69 Illness, unspecified: Secondary | ICD-10-CM | POA: Diagnosis not present

## 2017-12-17 MED ORDER — OSELTAMIVIR PHOSPHATE 75 MG PO CAPS: 75.0000 mg | ORAL_CAPSULE | Freq: Two times a day (BID) | ORAL | 0 refills | Status: DC

## 2017-12-17 NOTE — Discharge Instructions (Signed)
°  You may take 500mg acetaminophen every 4-6 hours or in combination with ibuprofen 400-600mg every 6-8 hours as needed for pain, inflammation, and fever. ° °Be sure to drink at least eight 8oz glasses of water to stay well hydrated and get at least 8 hours of sleep at night, preferably more while sick.  ° °Oseltamivir (Tamiflu) may cause stomach upset including nausea, vomiting and diarrhea.  It may also cause dizziness or hallucinations in children.  To help prevent stomach upset, you may take this medication with food.  If you are still having unwanted symptoms, you may stop taking this medication as it is not as important to finish the entire course like antibiotics.  If you have questions/concerns please call our office or follow up with your primary care provider.   ° °

## 2017-12-17 NOTE — ED Triage Notes (Signed)
Pt c/o productive cough, runny nose, HA, and LT ear ache x today. Denies fever. She took Advil and Tylenol at 1200.

## 2017-12-17 NOTE — ED Provider Notes (Signed)
[name]    CSN: [id number] Arrival date & time: 12/17/17  1459     History   Chief Complaint Chief Complaint  Patient presents with  . Cough    HPI Anne [name] is a 77 y.o. female.   HPI Anne [name] is a 77 y.o. female presenting to UC with her husband with c/o sudden onset Left ear pain, sore throat, body aches and fatigue that started around 5AM this morning.  She took 1 Tylenol and 1 Advil around 12PM today, which did help, but pt is concerned she may be coming down with the flu.  She had the flu in 2010, which developed into pneumonia, and she needed to be hospitalized for several days. Pt wants to try to make sure she does not get pneumonia again.  Denies chest pain or SOB. She did get the flu vaccine this season. Her husband has been sick with a cold-virus. He says he has had mild cough and congestion but no fever or body aches.     Past Medical History:  Diagnosis Date  . Abdominal pain 08/11/2017  . ALKALINE PHOSPHATASE, ELEVATED 03/15/2009  . Allergic state 06/10/2012  . Anemia   . Anxiety   . Anxiety and depression 04/28/2011  . Arthritis   . Atypical chest pain 11/30/2011  . 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 Baker's cyst of knee 05/22/2011  . Cancer ([place]) 01,  08   XRT/chemo 01-02/ lobular invasive ca  . Carotid artery disease ([place])    a. Carotid duplex 03/2014: stable 1-39% BICA, f/u due 03/2016.  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    [name] COPD (chronic obstructive pulmonary disease) ([place])   . Dermatitis 11/23/2012  . Dysphagia   . EE (eosinophilic esophagitis)   . Elevated sed rate 08/02/2013  . Esophageal ring   . ESOPHAGEAL STRICTURE 03/29/2009  . Fall 11/23/2012  . Family history of breast cancer   . Family history of colon cancer   . Family history of ovarian cancer   . Family history of pancreatic cancer   . Folliculitis of nose  5/80/9983  . GERD (gastroesophageal reflux disease) 09/29/2009   improved s/p cholecystectomy and esophagus dilatation  . Hematuria   . History of chicken pox   . History of measles   . History of shingles    2 episodes  . Hx of echocardiogram    a. Echo 01/2013: mild LVH, EF 55-60%, normal wall motion, Gr 1 diast dysfn  . Hyperlipidemia   . Knee pain, bilateral 07/23/2011  . Medicare annual wellness visit, subsequent 06/19/2015  . Mixed hyperlipidemia 10/17/2010   Qualifier: Diagnosis of  By: Mack Guise    . Orthostasis   . Osteopenia 03/14/2011  . Personal history of chemotherapy 2001  . Personal history of radiation therapy 2001   rt breast  . 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 Pneumonia 2-10   pleurisy  . PVC's (premature ventricular contractions)    a. Event monitor 01/2013: NSR, extensive PVCs.  . Radial neck fracture 10/2011   minimally displaced  . Sinusitis, acute 12/04/2015  . Trauma 10/18/2011  . URI (upper respiratory infection) 09/02/2012  . Urinary frequency 12/12/2014  . Urinary incontinence 03/19/2012  . Vaginitis 05/22/2011    Patient Active Problem List   Diagnosis Date Noted  . Low back pain 09/23/2017  . Hypokalemia 08/11/2017  .  Abdominal pain 08/11/2017  . SBO (small bowel obstruction) ([place]) 08/10/2017  . History of right breast cancer 03/21/2017  . Genetic testing 09/13/2016  . Family history of breast cancer   . Family history of ovarian cancer   . Family history of pancreatic cancer   . Family history of colon cancer   . Acute appendicitis s/p lap appendectomy 08/04/2016 08/03/2016  . Pain in the chest 05/16/2016  . Status post right breast reconstruction 05/11/2016  . Breast cancer 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 ([place]) 05/11/2016  . History of breast cancer in female 05/11/2016  . Osteopenia determined by x-ray 05/11/2016  . IBS (irritable bowel syndrome) 03/09/2016  . Sinusitis, acute 12/04/2015  . Shortness of breath 06/19/2015  . Medicare annual  wellness visit, subsequent 06/19/2015  . Urinary frequency 12/12/2014  . Breast cancer, right breast ([place]) 10/18/2014  . Superficial thrombophlebitis 03/16/2014  . Plant dermatitis 03/16/2014  . Chest wall pain 02/07/2014  . Primary localized osteoarthrosis, lower leg 01/21/2014  . Elevated sed rate 08/02/2013  . Dermatitis 11/23/2012  . Preventative health care 10/21/2012  . Allergic state 06/10/2012  . Urinary incontinence 03/19/2012  . Anemia 12/21/2011  . Knee pain, bilateral 07/23/2011  . Baker's cyst of knee 05/22/2011  . Eosinophilic esophagitis 62/37/6283  . Anxiety and depression 04/28/2011  . 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 Constipation, chronic 03/15/2011  . Mixed hyperlipidemia 10/17/2010  . CAROTID ARTERY STENOSIS 01/13/2010  . GERD 09/29/2009  . PERSONAL HX BREAST CANCER 09/29/2009    Past Surgical History:  Procedure Laterality Date  . ANTERIOR CRUCIATE LIGAMENT REPAIR  08, 09, 10  . AUGMENTATION MAMMAPLASTY Right 05/27/2007  . BREAST BIOPSY Right 01/02/2007   wire loc  . BREAST BIOPSY  12/26/2006  . BREAST LUMPECTOMY Right 2001  . BREAST RECONSTRUCTION  2008, 2009, 2010  . BREAST REDUCTION WITH MASTOPEXY Left 05/30/2017   Procedure: LEFT BREAST REDUCTION FOR SYMTERY WITH MASTOPEXY;  Surgeon: Wallace Going, DO;  Location: [facility];  Service: Plastics;  Laterality: Left;  . CHOLECYSTECTOMY  2010  . COLONOSCOPY  09/05/10   cecal avm's  . DENTAL SURGERY  05/2016   4 dental implants by Dr. Loyal Gambler.  [name] ERCP  2010    CBD stone extraction   . ESOPHAGOGASTRODUODENOSCOPY  01/08/2012   Procedure: ESOPHAGOGASTRODUODENOSCOPY (EGD);  Surgeon: Gatha Mayer, MD;  Location: [facility];  Service: Endoscopy;  Laterality: N/A;  . ESOPHAGOGASTRODUODENOSCOPY (EGD) WITH ESOPHAGEAL DILATION  2010, 2012  . LAPAROSCOPIC APPENDECTOMY N/A 08/04/2016   Procedure: APPENDECTOMY LAPAROSCOPIC;  Surgeon: Michael Boston, MD;  Location: [facility];   Service: General;  Laterality: N/A;  . LIPOSUCTION WITH LIPOFILLING Left 11/21/2017   Procedure: LIPOSUCTION FROM ABDOMEN WITH LIPOFILLING TO LEFT BREAST;  Surgeon: Wallace Going, DO;  Location: [facility];  Service: Plastics;  Laterality: Left;  [name] MASTECTOMY MODIFIED RADICAL Right 05/27/2007   , Mastectomy modified radical (08), breast reconstruction, CA lesions excised lateral abd wall 2010  . MASTOPEXY Left 11/21/2017   Procedure: LEFT BREAST REVISION MASTOPEXY FOR SYMMETRY;  Surgeon: Wallace Going, DO;  Location: [facility];  Service: Plastics;  Laterality: Left;  . SAVORY DILATION  01/08/2012   Procedure: SAVORY DILATION;  Surgeon: Gatha Mayer, MD;  Location: [facility];  Service: Endoscopy;  Laterality: N/A;  need xray    OB History    No data available       Home Medications    Prior to Admission medications  Medication Sig Start Date End Date Taking? Authorizing Provider  amitriptyline (ELAVIL) 150 MG tablet Take 1 tablet (150 mg total) by mouth at bedtime. 11/21/17  Yes [name], MD  atorvastatin (LIPITOR) 20 MG tablet Take 1 tablet (20 mg total) by mouth 2 (two) times daily. 11/04/17  Yes [name], MD  pantoprazole (PROTONIX) 40 MG tablet TAKE 1 TABLET(40 MG) BY MOUTH DAILY 11/04/17  Yes [name], MD  acetaminophen (TYLENOL) 500 MG tablet Take 500-1,000 mg by mouth every 6 (six) hours as needed (for pain.).    [provider]  aspirin EC 81 MG tablet Take 81 mg by mouth daily.    [provider]  Cholecalciferol (VITAMIN D3) 1000 UNITS CAPS Take 2,000 Units by mouth daily.     [provider]  Cyanocobalamin (VITAMIN B 12 PO) Take 1 tablet by mouth daily.    [provider]  diltiazem (CARDIZEM) 30 MG tablet Take 1 tablet (30 mg total) by mouth daily. take 1 tablet by mouth q am FOR PALPITATIONS 08/12/17   Aline August, MD  ferrous sulfate 325 (65 FE) MG tablet Take 325 mg by  mouth daily with breakfast.    [provider]  LORazepam (ATIVAN) 1 MG tablet Take 1 tablet (1 mg total) by mouth every 8 (eight) hours as needed for anxiety. 10/22/17   [name], MD  oseltamivir (TAMIFLU) 75 MG capsule Take 1 capsule (75 mg total) by mouth every 12 (twelve) hours. 12/17/17   Noe Gens, PA-C  Probiotic Product (PROBIOTIC FORMULA PO) Take 1 capsule by mouth daily.    [provider]  triamcinolone cream (KENALOG) 0.1 % Apply 1 application topically as needed. 08/12/17   Aline August, MD  Wheat Dextrin (BENEFIBER ON THE GO) PACK Take 1 Package by mouth daily. 08/14/17   Gatha Mayer, MD    Family History Family History  Problem Relation Age of Onset  . Heart disease Father   . Lung cancer Father        smoker  . Cirrhosis Sister        Primary Biliary  . Stroke Maternal Grandmother   . Alcohol abuse Maternal Grandfather   . Heart disease Paternal Grandfather   . Anxiety disorder Sister   . Osteoporosis Sister   . Arthritis Sister        Rheumatoid  . Osteoporosis Sister   . Skin cancer Sister        multiple skin cancers, over 61 excisions.  . Other Mother        tic [place]  . Breast cancer Maternal Aunt        dx in her 22s  . Breast cancer Paternal Aunt        dx in her 5s  . Pancreatic cancer Maternal Uncle        dx in his 51s; smoker  . Breast cancer Maternal Aunt        dx in her 75s  . Breast cancer Maternal Aunt        possible breast cancer dx and died in her 57s  . Ovarian cancer Maternal Aunt 29  . Pancreatic cancer Maternal Aunt        dx in her 38s  . Stomach cancer Maternal Uncle   . Colon cancer Maternal Uncle   . Lung cancer Paternal Aunt   . Breast cancer Cousin        paternal first cousin  . Breast  cancer Cousin        maternal first cousin  . Anesthesia problems Neg Hx   . Hypotension Neg Hx   . Malignant hyperthermia Neg Hx   . Pseudochol deficiency Neg Hx     Social History Social History     Tobacco Use  . Smoking status: Former Smoker    Packs/day: 1.00    Years: 50.00    Pack years: 50.00    Types: Cigarettes    Last attempt to quit: 05/22/2007    Years since quitting: 10.5  . Smokeless tobacco: Never Used  Substance Use Topics  . Alcohol use: Yes    Comment: social  . Drug use: No     Allergies   Codeine; Morphine; Peanut-containing drug products; Sorbitol; Tomato; Zofran; Advil [ibuprofen]; Diphenhydramine hcl; and Oxycodone   Review of Systems Review of Systems  Constitutional: Positive for fatigue. Negative for chills and fever.  HENT: Positive for congestion, ear pain (Left) and sore throat (Left ). Negative for trouble swallowing and voice change.   Respiratory: Positive for cough. Negative for shortness of breath.   Cardiovascular: Negative for chest pain and palpitations.  Gastrointestinal: Negative for abdominal pain, diarrhea, nausea and vomiting.  Musculoskeletal: Positive for arthralgias, back pain and myalgias.       Body aches  Skin: Negative for rash.  Neurological: Positive for headaches ( Left). Negative for dizziness and light-headedness.     Physical Exam Triage Vital Signs ED Triage Vitals  Enc Vitals Group     BP 12/17/17 1709 119/79     Pulse Rate 12/17/17 1709 96     Resp 12/17/17 1709 16     Temp 12/17/17 1709 98.5 F (36.9 C)     Temp Source 12/17/17 1709 Oral     SpO2 12/17/17 1709 97 %     Weight 12/17/17 1711 122 lb (55.3 kg)     Height 12/17/17 1711 5' 5.5" (1.664 m)     Head Circumference --      Peak Flow --      Pain Score 12/17/17 1711 0     Pain Loc --      Pain Edu? --      Excl. in [place]? --    No data found.  Updated Vital Signs BP 119/79 (BP Location: Left Arm)   Pulse 96   Temp 98.5 F (36.9 C) (Oral)   Resp 16   Ht 5' 5.5" (1.664 m)   Wt 122 lb (55.3 kg)   SpO2 97%   BMI 19.99 kg/m   Visual Acuity Right Eye Distance:   Left Eye Distance:   Bilateral Distance:    Right Eye Near:   Left Eye  Near:    Bilateral Near:     Physical Exam  Constitutional: She is oriented to person, place, and time. She appears well-developed and well-nourished. No distress.  HENT:  Head: Normocephalic and atraumatic.  Right Ear: Tympanic membrane normal.  Left Ear: Tympanic membrane normal.  Nose: Nose normal. Right sinus exhibits no maxillary sinus tenderness and no frontal sinus tenderness. Left sinus exhibits no maxillary sinus tenderness and no frontal sinus tenderness.  Mouth/Throat: Uvula is midline, oropharynx is clear and moist and mucous membranes are normal.  Eyes: EOM are normal.  Neck: Normal range of motion. Neck supple.  Cardiovascular: Normal rate and regular rhythm.  Pulmonary/Chest: Effort normal and breath sounds normal. No stridor. No respiratory distress. She has no wheezes. She has no rales.  Musculoskeletal: Normal  range of motion.  Lymphadenopathy:    She has no cervical adenopathy.  Neurological: She is alert and oriented to person, place, and time.  Skin: Skin is warm and dry. She is not diaphoretic.  Psychiatric: She has a normal mood and affect. Her behavior is normal.  Nursing note and vitals reviewed.    UC Treatments / Results  Labs (all labs ordered are listed, but only abnormal results are displayed) Labs Reviewed - No data to display  EKG  EKG Interpretation None       Radiology No results found.  Procedures Procedures (including critical care time)  Medications Ordered in UC Medications - No data to display   Initial Impression / Assessment and Plan / UC Course  I have reviewed the triage vital signs and the nursing notes.  Pertinent labs & imaging results that were available during my care of the patient were reviewed by me and considered in my medical decision making (see chart for details).     Hx and exam c/w viral URI. Questionable influenza given reported body aches, sudden onset, and mid-to-peak flu season Pt would like to start  Tamiflu Will send to pharmacy Encouraged fluids, rest, acetaminophen and ibuprofen F/u with PCP next week if not improving, sooner if worsening.   Final Clinical Impressions(s) / UC Diagnoses   Final diagnoses:  Influenza-like illness    ED Discharge Orders        Ordered    oseltamivir (TAMIFLU) 75 MG capsule  Every 12 hours     12/17/17 1735       Controlled Substance Prescriptions [place] Controlled Substance Registry consulted? Not Applicable   Tyrell Antonio 12/17/17 1747

## 2017-12-19 ENCOUNTER — Telehealth: Payer: Self-pay | Admitting: Family Medicine

## 2017-12-19 NOTE — Telephone Encounter (Signed)
Copied from [place]. Topic: Quick Communication - Rx Refill/Question >> Dec 19, 2017  1:23 PM Robina Ade, Helene Kelp D wrote: Medication: LORazepam (ATIVAN) 1 MG tablet   Has the patient contacted their pharmacy? No new rx, new pharmacy   (Agent: If no, request that the patient contact the pharmacy for the refill.)   Preferred Pharmacy (with phone number or street name): [facility] at [address], [place], [place] [address]   Agent: Please be advised that RX refills may take up to 3 business days. We ask that you follow-up with your pharmacy.

## 2017-12-19 NOTE — Telephone Encounter (Signed)
Patient was just given 70 tablets in December with 5 additional refills. Advised patient to contact pharmacy.

## 2017-12-19 NOTE — Telephone Encounter (Signed)
lorazepam refill Last OV: 08/05/17 Last Refill:10/22/17 #70 tabs 5 refills Pharmacy:[facility] [address]

## 2017-12-20 ENCOUNTER — Other Ambulatory Visit: Payer: Self-pay

## 2017-12-20 DIAGNOSIS — Z79899 Other long term (current) drug therapy: Secondary | ICD-10-CM

## 2017-12-20 DIAGNOSIS — S299XXA Unspecified injury of thorax, initial encounter: Secondary | ICD-10-CM

## 2017-12-21 ENCOUNTER — Encounter: Payer: Self-pay | Admitting: Family Medicine

## 2017-12-21 DIAGNOSIS — S299XXA Unspecified injury of thorax, initial encounter: Secondary | ICD-10-CM

## 2017-12-21 IMAGING — MG 2D DIGITAL SCREENING UNILATERAL LEFT MAMMOGRAM WITH CAD AND ADJU
6 series · 6 of 14 positions shown · non-contrast
Comparison: Previous exam(s).

CLINICAL DATA: Screening. Prior malignant right mastectomy in 1112
with implant reconstruction.

EXAM:
2D DIGITAL SCREENING UNILATERAL LEFT MAMMOGRAM WITH CAD AND ADJUNCT
TOMO

[L CC synth-2D]
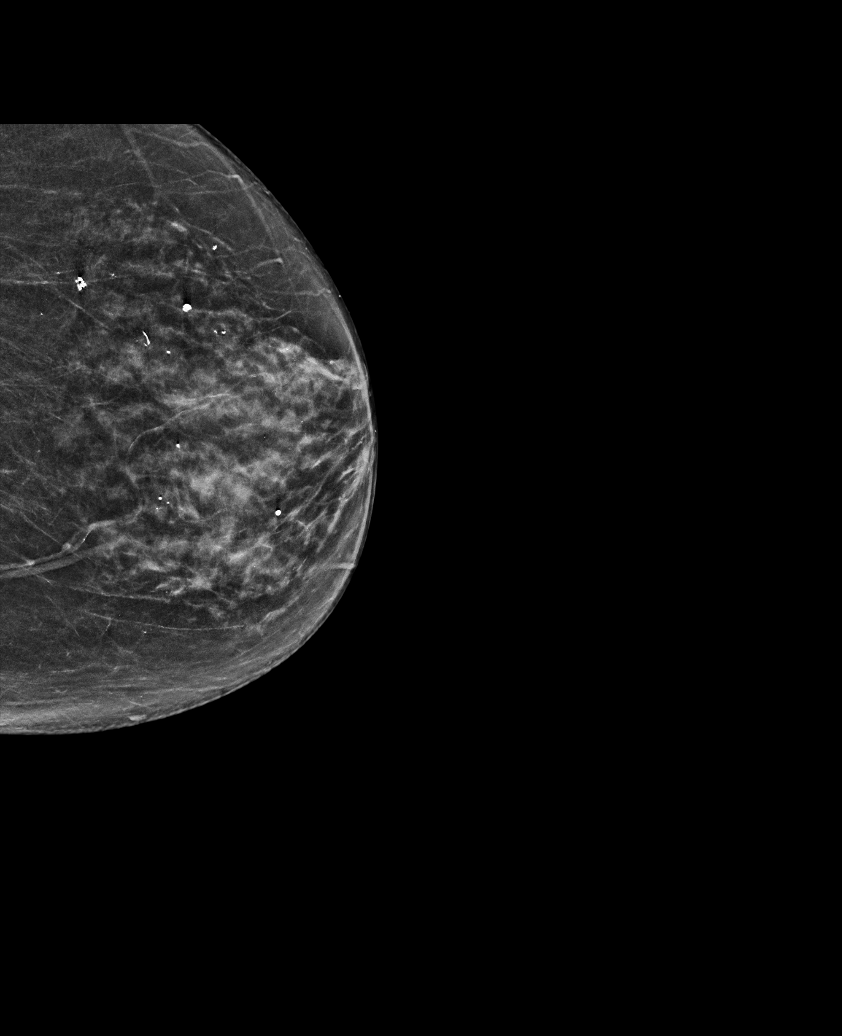

[L MLO]
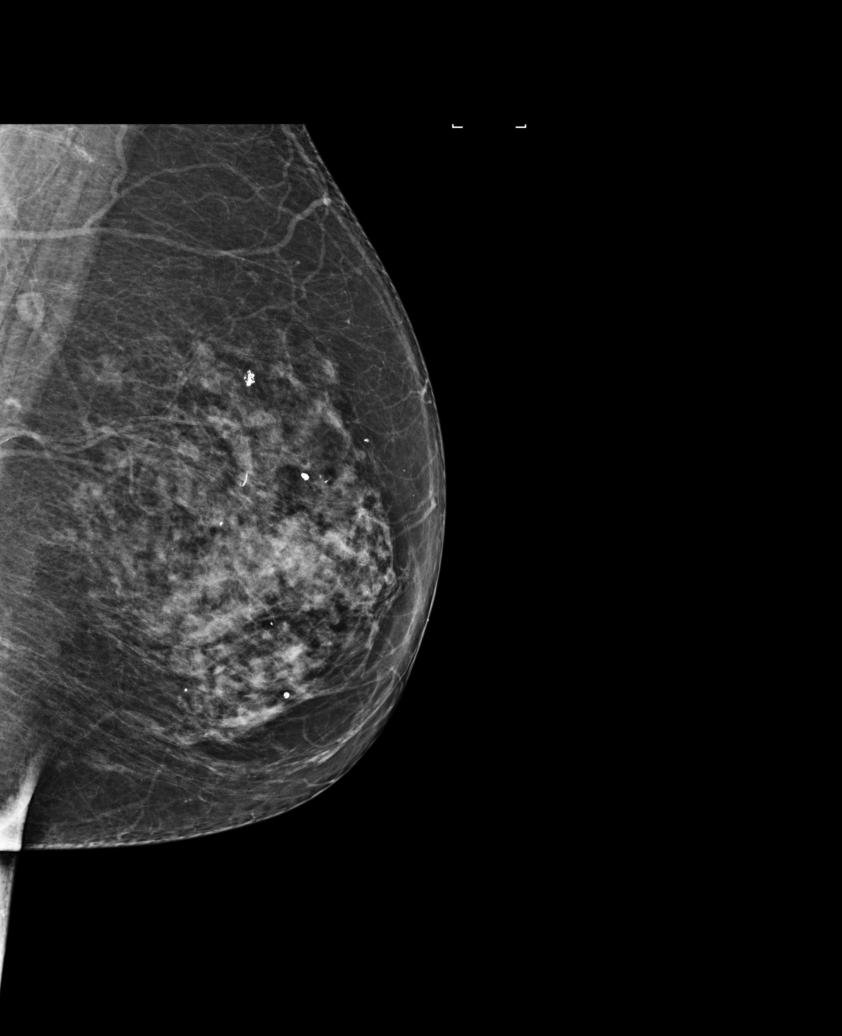

[L MLO synth-2D]
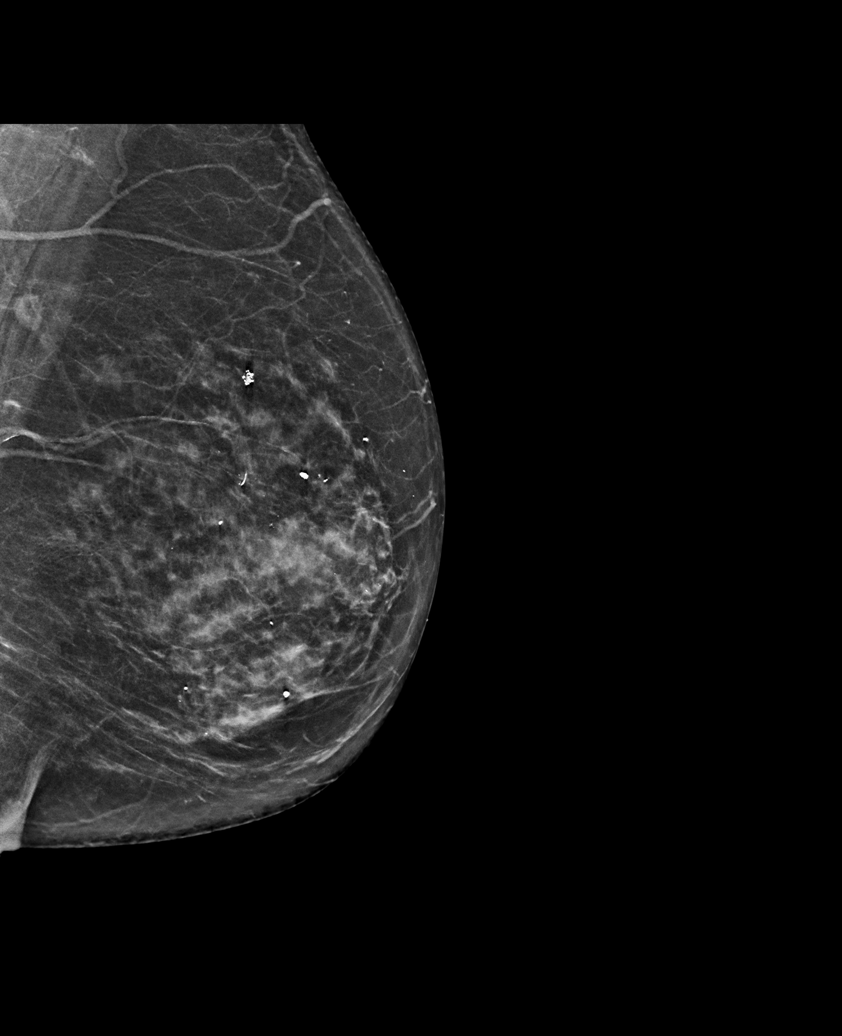

[L CC]
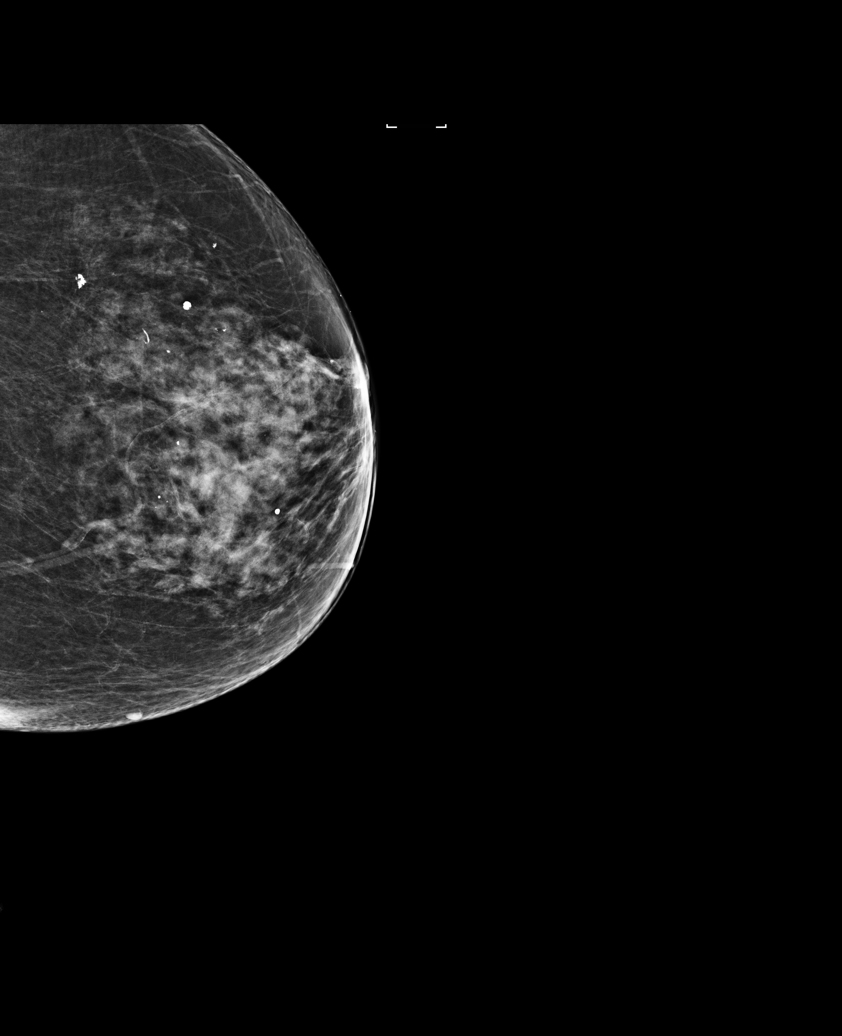

[L MLO tomo · tomo slice 32/63.0]
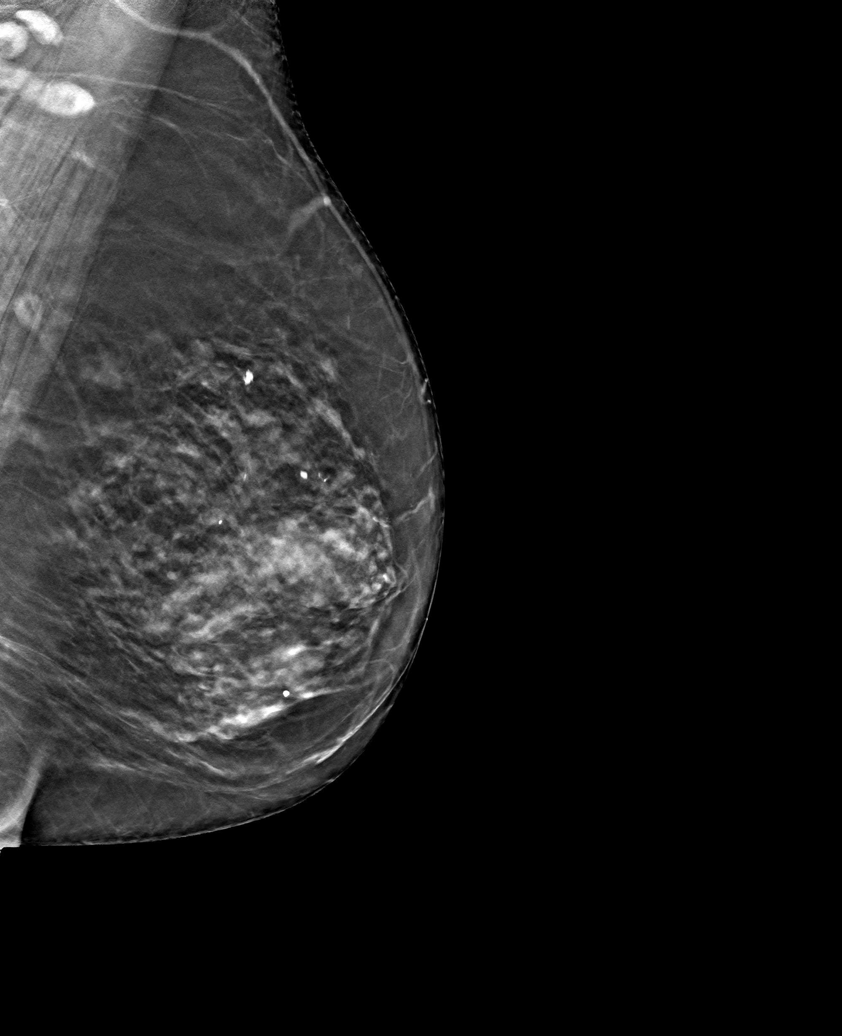

[L CC tomo · tomo slice 31/60.0]
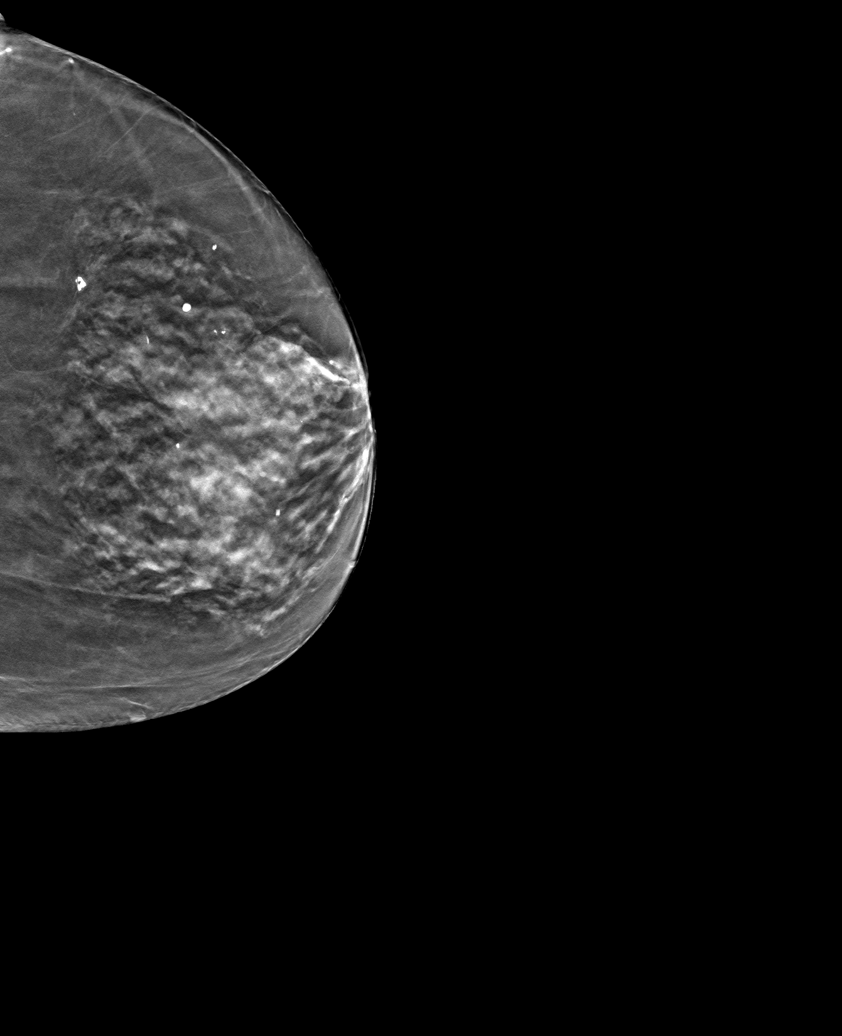

[6 of 14 positions shown; findings below may reference images not displayed]

ACR Breast Density Category c: The breast tissue is heterogeneously
dense, which may obscure small masses.
FINDINGS: The patient has had a right mastectomy. There are no findings
suspicious for malignancy.

Images were processed with CAD.
IMPRESSION: No mammographic evidence of malignancy. A result letter of this
screening mammogram will be mailed directly to the patient.

RECOMMENDATION:
Screening mammogram in one year.  (Code:NB-B-LCB)

BI-RADS CATEGORY  1: Negative.

## 2017-12-23 ENCOUNTER — Other Ambulatory Visit: Payer: PPO

## 2017-12-23 DIAGNOSIS — Z79899 Other long term (current) drug therapy: Secondary | ICD-10-CM | POA: Diagnosis not present

## 2017-12-23 MED ORDER — LORAZEPAM 1 MG PO TABS: 1.0000 mg | ORAL_TABLET | Freq: Three times a day (TID) | ORAL | 5 refills | Status: DC | PRN

## 2017-12-23 NOTE — Telephone Encounter (Signed)
Pt's previous message was requesting Lorazepam refill. Previous Lorazepam Rx went to [facility] which pt no longer uses. She would like Rx to go to [facility] in [place].  Last RX: 10/22/17, #70 x 5 refills to [facility] BUT pt NO LONGER uses them. Needs New Rx sent to [facility] Last OV: 09/19/17 Next OV: Due 12/20/17 and has no appts scheduled. UDS: 12/07/14 CSC: 12/07/14 CSR: No discrepancies identified

## 2017-12-23 NOTE — Telephone Encounter (Signed)
Rx was printed and was on [place] [facility] desk. Pt completed lab appt today and was given RX. [place] office will schedule f/u for her as she was due on 12/20/17.

## 2017-12-28 LAB — PAIN MGMT, PROFILE 8 W/CONF, U
6 Acetylmorphine: NEGATIVE ng/mL (ref <10)
Alcohol Metabolites: NEGATIVE ng/mL (ref <500)
Alphahydroxyalprazolam: NEGATIVE ng/mL (ref <25)
Alphahydroxymidazolam: NEGATIVE ng/mL (ref <50)
Alphahydroxytriazolam: NEGATIVE ng/mL (ref <50)
Aminoclonazepam: NEGATIVE ng/mL (ref <25)
Amphetamines: NEGATIVE ng/mL (ref <500)
BUPRENORPHINE, URINE: NEGATIVE ng/mL (ref <5)
Benzodiazepines: POSITIVE ng/mL — AB (ref <100)
CREATININE: 78.7 mg/dL
Cocaine Metabolite: NEGATIVE ng/mL (ref <150)
HYDROXYETHYLFLURAZEPAM: NEGATIVE ng/mL (ref <50)
MARIJUANA METABOLITE: NEGATIVE ng/mL (ref <20)
MDMA: NEGATIVE ng/mL (ref <500)
NORDIAZEPAM: NEGATIVE ng/mL (ref <50)
OXYCODONE: NEGATIVE ng/mL (ref <100)
Opiates: NEGATIVE ng/mL (ref <100)
Oxazepam: NEGATIVE ng/mL (ref <50)
Oxidant: NEGATIVE ug/mL (ref <200)
PH: 7.64 (ref 4.5–9.0)
Temazepam: NEGATIVE ng/mL (ref <50)

## 2018-01-02 ENCOUNTER — Encounter: Payer: Self-pay | Admitting: Family Medicine

## 2018-01-09 ENCOUNTER — Ambulatory Visit: Payer: PPO | Admitting: Family Medicine

## 2018-02-03 ENCOUNTER — Ambulatory Visit (INDEPENDENT_AMBULATORY_CARE_PROVIDER_SITE_OTHER): Payer: PPO | Admitting: Family Medicine

## 2018-02-03 ENCOUNTER — Encounter: Payer: Self-pay | Admitting: Family Medicine

## 2018-02-03 VITALS — BP 110/66 | HR 88 | Temp 97.9°F | Resp 18 | Wt 124.6 lb

## 2018-02-03 DIAGNOSIS — L989 Disorder of the skin and subcutaneous tissue, unspecified: Secondary | ICD-10-CM

## 2018-02-03 DIAGNOSIS — R0600 Dyspnea, unspecified: Secondary | ICD-10-CM

## 2018-02-03 DIAGNOSIS — K589 Irritable bowel syndrome without diarrhea: Secondary | ICD-10-CM

## 2018-02-03 MED ORDER — TRIAMCINOLONE ACETONIDE 0.1 % EX CREA: 1.0000 "application " | TOPICAL_CREAM | CUTANEOUS | Status: DC | PRN

## 2018-02-03 NOTE — Progress Notes (Signed)
Subjective:  I acted as a Education administrator for Dr. Charlett Blake. [place], [place]  Patient ID: Anne [name], female    DOB: 1941/01/01, 77 y.o.   MRN: [id number]  No chief complaint on file.   HPI  Patient is in today for follow up and accompanied by her husband. She feels well today. No recent febrile illness or hospitalization. She is noting an increase in shortness of breat with exertion. No sob at rest. Denies CP/palp/HA/congestion/fevers/GI or GU c/o. Taking meds as prescribed  Patient Care Team: [name], MD as PCP - General (Family Medicine) Paralee Cancel, MD as Consulting Physician (Orthopedic Surgery) Fay Records, MD as Consulting Physician (Cardiology) Gordy Levan, MD as Consulting Physician (Oncology) Gatha Mayer, MD as Consulting Physician (Gastroenterology) Clent Jacks, MD as Consulting Physician (Ophthalmology) Roney Jaffe, DDS (Oral Surgery) Everlene Other (Dentistry) [place], Jennefer Bravo, MD as Referring Physician (Dermatology) Dillingham, Loel Lofty, DO as Attending Physician (Plastic Surgery)   Past Medical History:  Diagnosis Date  . Abdominal pain 08/11/2017  . ALKALINE PHOSPHATASE, ELEVATED 03/15/2009  . Allergic state 06/10/2012  . Anemia   . Anxiety   . Anxiety and depression 04/28/2011  . Arthritis   . Atypical chest pain 11/30/2011  . 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 Baker's cyst of knee 05/22/2011  . Cancer ([place]) 01,  08   XRT/chemo 01-02/ lobular invasive ca  . Carotid artery disease ([place])    a. Carotid duplex 03/2014: stable 1-39% BICA, f/u due 03/2016.  [name] Chronic alcoholism in remission ([place]) 03/29/2011   Did not tolerate Klonopin, caused some confusion and bad dreams.    [name] COPD (chronic obstructive pulmonary disease) ([place])   . Dermatitis 11/23/2012  . Dysphagia   . EE (eosinophilic esophagitis)   . Elevated sed rate 08/02/2013  . Esophageal ring   . ESOPHAGEAL STRICTURE 03/29/2009  . Fall 11/23/2012  . Family history  of breast cancer   . Family history of colon cancer   . Family history of ovarian cancer   . Family history of pancreatic cancer   . Folliculitis of nose 2/54/2706  . GERD (gastroesophageal reflux disease) 09/29/2009   improved s/p cholecystectomy and esophagus dilatation  . Hematuria   . History of chicken pox   . History of measles   . History of shingles    2 episodes  . Hx of echocardiogram    a. Echo 01/2013: mild LVH, EF 55-60%, normal wall motion, Gr 1 diast dysfn  . Hyperlipidemia   . Knee pain, bilateral 07/23/2011  . Medicare annual wellness visit, subsequent 06/19/2015  . Mixed hyperlipidemia 10/17/2010   Qualifier: Diagnosis of  By: Mack Guise    . Orthostasis   . Osteopenia 03/14/2011  . Personal history of chemotherapy 2001  . Personal history of radiation therapy 2001   rt breast  . 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 Pneumonia 2-10   pleurisy  . PVC's (premature ventricular contractions)    a. Event monitor 01/2013: NSR, extensive PVCs.  . Radial neck fracture 10/2011   minimally displaced  . Sinusitis, acute 12/04/2015  . Trauma 10/18/2011  . URI (upper respiratory infection) 09/02/2012  . Urinary frequency 12/12/2014  . Urinary incontinence 03/19/2012  . Vaginitis 05/22/2011    Past Surgical History:  Procedure Laterality Date  . ANTERIOR CRUCIATE LIGAMENT REPAIR  08, 09, 10  . AUGMENTATION MAMMAPLASTY Right 05/27/2007  . BREAST BIOPSY Right 01/02/2007   wire loc  .  BREAST BIOPSY  12/26/2006  . BREAST LUMPECTOMY Right 2001  . BREAST RECONSTRUCTION  2008, 2009, 2010  . BREAST REDUCTION WITH MASTOPEXY Left 05/30/2017   Procedure: LEFT BREAST REDUCTION FOR SYMTERY WITH MASTOPEXY;  Surgeon: Wallace Going, DO;  Location: [facility];  Service: Plastics;  Laterality: Left;  . CHOLECYSTECTOMY  2010  . COLONOSCOPY  09/05/10   cecal avm's  . DENTAL SURGERY  05/2016   4 dental implants by Dr. Loyal Gambler.  [name] ERCP  2010    CBD stone extraction    . ESOPHAGOGASTRODUODENOSCOPY  01/08/2012   Procedure: ESOPHAGOGASTRODUODENOSCOPY (EGD);  Surgeon: Gatha Mayer, MD;  Location: [facility];  Service: Endoscopy;  Laterality: N/A;  . ESOPHAGOGASTRODUODENOSCOPY (EGD) WITH ESOPHAGEAL DILATION  2010, 2012  . LAPAROSCOPIC APPENDECTOMY N/A 08/04/2016   Procedure: APPENDECTOMY LAPAROSCOPIC;  Surgeon: Michael Boston, MD;  Location: [facility];  Service: General;  Laterality: N/A;  . LIPOSUCTION WITH LIPOFILLING Left 11/21/2017   Procedure: LIPOSUCTION FROM ABDOMEN WITH LIPOFILLING TO LEFT BREAST;  Surgeon: Wallace Going, DO;  Location: [facility];  Service: Plastics;  Laterality: Left;  [name] MASTECTOMY MODIFIED RADICAL Right 05/27/2007   , Mastectomy modified radical (08), breast reconstruction, CA lesions excised lateral abd wall 2010  . MASTOPEXY Left 11/21/2017   Procedure: LEFT BREAST REVISION MASTOPEXY FOR SYMMETRY;  Surgeon: Wallace Going, DO;  Location: [facility];  Service: Plastics;  Laterality: Left;  . SAVORY DILATION  01/08/2012   Procedure: SAVORY DILATION;  Surgeon: Gatha Mayer, MD;  Location: [facility];  Service: Endoscopy;  Laterality: N/A;  need xray    Family History  Problem Relation Age of Onset  . Heart disease Father   . Lung cancer Father        smoker  . Cirrhosis Sister        Primary Biliary  . Stroke Maternal Grandmother   . Alcohol abuse Maternal Grandfather   . Heart disease Paternal Grandfather   . Anxiety disorder Sister   . Osteoporosis Sister   . Arthritis Sister        Rheumatoid  . Osteoporosis Sister   . Skin cancer Sister        multiple skin cancers, over 32 excisions.  . Other Mother        tic [place]  . Breast cancer Maternal Aunt        dx in her 67s  . Breast cancer Paternal Aunt        dx in her 38s  . Pancreatic cancer Maternal Uncle        dx in his 7s; smoker  . Breast cancer Maternal Aunt        dx in her 57s  . Breast cancer Maternal Aunt         possible breast cancer dx and died in her 38s  . Ovarian cancer Maternal Aunt 29  . Pancreatic cancer Maternal Aunt        dx in her 35s  . Stomach cancer Maternal Uncle   . Colon cancer Maternal Uncle   . Lung cancer Paternal Aunt   . Breast cancer Cousin        paternal first cousin  . Breast cancer Cousin        maternal first cousin  . Anesthesia problems Neg Hx   . Hypotension Neg Hx   . Malignant hyperthermia Neg Hx   . Pseudochol deficiency Neg Hx     Social History  Socioeconomic History  . Marital status: Married    Spouse name: Sam  . Number of children: 3  . Years of education: Not on file  . Highest education level: Not on file  Occupational History  . Occupation: Social worker  Social Needs  . Financial resource strain: Not on file  . Food insecurity:    Worry: Not on file    Inability: Not on file  . Transportation needs:    Medical: Not on file    Non-medical: Not on file  Tobacco Use  . Smoking status: Former Smoker    Packs/day: 1.00    Years: 50.00    Pack years: 50.00    Types: Cigarettes    Last attempt to quit: 05/22/2007    Years since quitting: 10.7  . Smokeless tobacco: Never Used  Substance and Sexual Activity  . Alcohol use: Yes    Comment: social  . Drug use: No  . Sexual activity: Yes    Partners: Male    Comment: lives with husband,   Lifestyle  . Physical activity:    Days per week: Not on file    Minutes per session: Not on file  . Stress: Not on file  Relationships  . Social connections:    Talks on phone: Not on file    Gets together: Not on file    Attends religious service: Not on file    Active member of club or organization: Not on file    Attends meetings of clubs or organizations: Not on file    Relationship status: Not on file  . Intimate partner violence:    Fear of current or ex partner: Not on file    Emotionally abused: Not on file    Physically abused: Not on file    Forced sexual activity: Not on file   Other Topics Concern  . Not on file  Social History Narrative  . Not on file    Outpatient Medications Prior to Visit  Medication Sig Dispense Refill  . acetaminophen (TYLENOL) 500 MG tablet Take 500-1,000 mg by mouth every 6 (six) hours as needed (for pain.).    [name] amitriptyline (ELAVIL) 150 MG tablet Take 1 tablet (150 mg total) by mouth at bedtime. 30 tablet 5  . aspirin EC 81 MG tablet Take 81 mg by mouth daily.    [name] atorvastatin (LIPITOR) 20 MG tablet Take 1 tablet (20 mg total) by mouth 2 (two) times daily. 90 tablet 3  . Cholecalciferol (VITAMIN D3) 1000 UNITS CAPS Take 2,000 Units by mouth daily.     . Cyanocobalamin (VITAMIN B 12 PO) Take 1 tablet by mouth daily.    [name] diltiazem (CARDIZEM) 30 MG tablet Take 1 tablet (30 mg total) by mouth daily. take 1 tablet by mouth q am FOR PALPITATIONS    . ferrous sulfate 325 (65 FE) MG tablet Take 325 mg by mouth daily with breakfast.    . LORazepam (ATIVAN) 1 MG tablet Take 1 tablet (1 mg total) by mouth every 8 (eight) hours as needed for anxiety. 70 tablet 5  . pantoprazole (PROTONIX) 40 MG tablet TAKE 1 TABLET(40 MG) BY MOUTH DAILY 90 tablet 0  . Probiotic Product (PROBIOTIC FORMULA PO) Take 1 capsule by mouth daily.    . Wheat Dextrin (BENEFIBER ON THE GO) PACK Take 1 Package by mouth daily.    [name] oseltamivir (TAMIFLU) 75 MG capsule Take 1 capsule (75 mg total) by mouth every 12 (twelve) hours. 10 capsule 0  .  triamcinolone cream (KENALOG) 0.1 % Apply 1 application topically as needed.     No facility-administered medications prior to visit.     Allergies  Allergen Reactions  . Codeine Other (See Comments)    "flu like symptoms"  . Morphine Other (See Comments)    "flu like symptoms"  . Peanut-Containing Drug Products Hives  . Sorbitol Other (See Comments)    GI Issues  . Tomato Diarrhea  . Zofran Other (See Comments)    headache  . Advil [Ibuprofen] Other (See Comments)    Irritates throat.  . Diphenhydramine Hcl Other (See  Comments)    "nervous and upset"  . Oxycodone Anxiety    Confusion with inability to think clearly    Review of Systems  Constitutional: Negative for fever.  HENT: Negative for congestion.   Eyes: Negative for blurred vision.  Respiratory: Negative for cough.   Cardiovascular: Negative for chest pain and palpitations.  Gastrointestinal: Negative for vomiting.  Musculoskeletal: Negative for back pain.  Skin: Negative for rash.  Neurological: Negative for loss of consciousness and headaches.       Objective:    Physical Exam  Constitutional: She is oriented to person, place, and time. She appears well-developed and well-nourished. No distress.  HENT:  Head: Normocephalic and atraumatic.  Nose: Nose normal.  Eyes: Right eye exhibits no discharge. Left eye exhibits no discharge.  Neck: Normal range of motion. Neck supple.  Cardiovascular: Normal rate and regular rhythm.  No murmur heard. Pulmonary/Chest: Effort normal and breath sounds normal.  Abdominal: Soft. Bowel sounds are normal. There is no tenderness.  Musculoskeletal: She exhibits no edema.  Neurological: She is alert and oriented to person, place, and time.  Skin: Skin is warm and dry.  Psychiatric: She has a normal mood and affect.  Nursing note and vitals reviewed.   BP 110/66 (BP Location: Left Arm, Patient Position: Sitting, Cuff Size: Normal)   Pulse 88   Temp 97.9 F (36.6 C) (Oral)   Resp 18   Wt 124 lb 9.6 oz (56.5 kg)   SpO2 97%   BMI 20.42 kg/m  Wt Readings from Last 3 Encounters:  02/03/18 124 lb 9.6 oz (56.5 kg)  12/17/17 122 lb (55.3 kg)  11/21/17 127 lb (57.6 kg)   BP Readings from Last 3 Encounters:  02/03/18 110/66  12/17/17 119/79  11/21/17 (!) 117/59     Immunization History  Administered Date(s) Administered  . Influenza Split 08/27/2011, 10/21/2012  . Influenza, High Dose Seasonal PF 08/01/2016, 09/19/2017  . Influenza,inj,Quad PF,6+ Mos 07/31/2013, 12/07/2014, 11/01/2015  .  Tdap 04/08/2013    Health Maintenance  Topic Date Due  . PNA vac Low Risk Adult (1 of 2 - PCV13) 09/09/2007  . COLONOSCOPY  09/05/2020  . TETANUS/TDAP  04/09/2023  . INFLUENZA VACCINE  Completed  . DEXA SCAN  Completed    Lab Results  Component Value Date   WBC 6.7 09/04/2017   HGB 11.7 09/04/2017   HCT 35.4 09/04/2017   PLT 231 09/04/2017   GLUCOSE 97 09/04/2017   CHOL 155 08/05/2017   TRIG 145.0 08/05/2017   HDL 61.90 08/05/2017   LDLDIRECT 117.4 01/13/2013   LDLCALC 64 08/05/2017   ALT 17 09/04/2017   AST 19 09/04/2017   NA 141 09/04/2017   K 3.8 09/04/2017   CL 114 (H) 08/12/2017   CREATININE 0.8 09/04/2017   BUN 13.2 09/04/2017   CO2 26 09/04/2017   TSH 2.33 08/05/2017   INR 1.02 08/12/2017  Lab Results  Component Value Date   TSH 2.33 08/05/2017   Lab Results  Component Value Date   WBC 6.7 09/04/2017   HGB 11.7 09/04/2017   HCT 35.4 09/04/2017   MCV 93.6 09/04/2017   PLT 231 09/04/2017   Lab Results  Component Value Date   NA 141 09/04/2017   K 3.8 09/04/2017   CHLORIDE 104 09/04/2017   CO2 26 09/04/2017   GLUCOSE 97 09/04/2017   BUN 13.2 09/04/2017   CREATININE 0.8 09/04/2017   BILITOT 0.34 09/04/2017   ALKPHOS 107 09/04/2017   AST 19 09/04/2017   ALT 17 09/04/2017   PROT 7.0 09/04/2017   ALBUMIN 3.8 09/04/2017   CALCIUM 9.5 09/04/2017   ANIONGAP 11 09/04/2017   EGFR >60 09/04/2017   GFR 88.18 08/05/2017   Lab Results  Component Value Date   CHOL 155 08/05/2017   Lab Results  Component Value Date   HDL 61.90 08/05/2017   Lab Results  Component Value Date   LDLCALC 64 08/05/2017   Lab Results  Component Value Date   TRIG 145.0 08/05/2017   Lab Results  Component Value Date   CHOLHDL 3 08/05/2017   No results found for: HGBA1C       Assessment & Plan:   Problem List Items Addressed This Visit    Dyspnea    Notes it has worsened some with exertion. Is set up with Echocardiogram and she will let us know if symptoms  worsen      Relevant Orders   ECHOCARDIOGRAM COMPLETE   IBS (irritable bowel syndrome)    No persistent complaints at todays visit.       Skin lesion of face - Primary    Referred to dermatology for further evaluation      Relevant Orders   Ambulatory referral to Dermatology      I have discontinued [place] R. Rehman "Torie"'s oseltamivir. I am also having her maintain her Vitamin D3, Probiotic Product (PROBIOTIC FORMULA PO), Cyanocobalamin (VITAMIN B 12 PO), ferrous sulfate, aspirin EC, acetaminophen, diltiazem, BENEFIBER ON THE GO, pantoprazole, atorvastatin, amitriptyline, LORazepam, and triamcinolone cream.  Meds ordered this encounter  Medications  . triamcinolone cream (KENALOG) 0.1 %    Sig: Apply 1 application topically as needed.    Dispense:  30 g    CMA served as Education administrator during this visit. History, Physical and Plan performed by medical provider. Documentation and orders reviewed and attested to.  Penni Homans, MD

## 2018-02-03 NOTE — Patient Instructions (Signed)
Shortness of Breath, Adult  Shortness of breath means you have trouble breathing. Your lungs are organs for breathing.  Follow these instructions at home:  Pay attention to any changes in your symptoms. Take these actions to help with your condition:  ? Do not smoke. Smoking can cause shortness of breath. If you need help to quit smoking, ask your doctor.  ? Avoid things that can make it harder to breathe, such as:  ? Mold.  ? Dust.  ? Air pollution.  ? Chemical smells.  ? Things that can cause allergy symptoms (allergens), if you have allergies.  ? Keep your living space clean and free of mold and dust.  ? Rest as needed. Slowly return to your usual activities.  ? Take over-the-counter and prescription medicines, including oxygen and inhaled medicines, only as told by your doctor.  ? Keep all follow-up visits as told by your doctor. This is important.  Contact a doctor if:  ? Your condition does not get better as soon as expected.  ? You have a hard time doing your normal activities, even after you rest.  ? You have new symptoms.  Get help right away if:  ? You have trouble breathing when you are resting.  ? You feel light-headed or you faint.  ? You have a cough that is not helped by medicines.  ? You cough up blood.  ? You have pain with breathing.  ? You have pain in your chest, arms, shoulders, or belly (abdomen).  ? You have a fever.  ? You cannot walk up stairs.  ? You cannot exercise the way you normally do.  This information is not intended to replace advice given to you by your health care provider. Make sure you discuss any questions you have with your health care provider.  Document Released: 04/16/2008 Document Revised: 11/15/2016 Document Reviewed: 11/15/2016  Elsevier Interactive Patient Education ? 2017 [employer]

## 2018-02-07 DIAGNOSIS — L989 Disorder of the skin and subcutaneous tissue, unspecified: Secondary | ICD-10-CM | POA: Insufficient documentation

## 2018-02-07 NOTE — Assessment & Plan Note (Signed)
Notes it has worsened some with exertion. Is set up with Echocardiogram and she will let us know if symptoms worsen

## 2018-02-07 NOTE — Assessment & Plan Note (Signed)
Referred to dermatology for further evaluation.

## 2018-02-07 NOTE — Assessment & Plan Note (Signed)
No persistent complaints at todays visit.

## 2018-02-10 ENCOUNTER — Other Ambulatory Visit: Payer: Self-pay

## 2018-02-10 DIAGNOSIS — L309 Dermatitis, unspecified: Secondary | ICD-10-CM

## 2018-02-10 MED ORDER — TRIAMCINOLONE ACETONIDE 0.1 % EX CREA: TOPICAL_CREAM | Freq: Two times a day (BID) | CUTANEOUS | 1 refills | Status: DC

## 2018-02-17 ENCOUNTER — Other Ambulatory Visit: Payer: Self-pay | Admitting: Family Medicine

## 2018-02-18 ENCOUNTER — Other Ambulatory Visit: Payer: Self-pay | Admitting: Family Medicine

## 2018-02-19 ENCOUNTER — Other Ambulatory Visit: Payer: Self-pay | Admitting: Family Medicine

## 2018-02-24 ENCOUNTER — Telehealth: Payer: Self-pay | Admitting: Internal Medicine

## 2018-02-24 NOTE — Telephone Encounter (Signed)
New Message:     Pt states she has been having SOB for the last 6 mnths but pt states she can not wait until July to see the doctor

## 2018-02-24 NOTE — Telephone Encounter (Signed)
Called patient back.  She states that she's SOB when going upstairs for about the past 6 months but it seems to be getting worse.  Her PCP ordered echo which is scheduled for 4/19.  Offered sooner appointment with APP on 5/23 (she requested Thurs afternoons)  Pt is appreciative for this earlier time.  Kept scheduled appointment in July with Dr. Harrington Challenger.

## 2018-02-25 ENCOUNTER — Encounter: Payer: Self-pay | Admitting: Family Medicine

## 2018-02-28 ENCOUNTER — Ambulatory Visit (HOSPITAL_BASED_OUTPATIENT_CLINIC_OR_DEPARTMENT_OTHER)
Admission: RE | Admit: 2018-02-28 | Discharge: 2018-02-28 | Disposition: A | Discharge status: Home / Self Care | Payer: PPO | Source: Ambulatory Visit | Attending: Family Medicine | Admitting: Family Medicine

## 2018-02-28 DIAGNOSIS — R0600 Dyspnea, unspecified: Secondary | ICD-10-CM | POA: Insufficient documentation

## 2018-02-28 DIAGNOSIS — E785 Hyperlipidemia, unspecified: Secondary | ICD-10-CM | POA: Diagnosis not present

## 2018-02-28 NOTE — Progress Notes (Signed)
Echocardiogram 2D Echocardiogram has been performed.  Anne [name] 02/28/2018, 3:52 PM

## 2018-03-04 ENCOUNTER — Encounter: Payer: Self-pay | Admitting: Family Medicine

## 2018-03-11 ENCOUNTER — Encounter: Payer: Self-pay | Admitting: Hematology

## 2018-03-12 NOTE — Progress Notes (Deleted)
Subjective:   Anne [name] is a 77 y.o. female who presents for Medicare Annual (Subsequent) preventive examination.  Review of Systems:  No ROS.  Medicare Wellness Visit. Additional risk factors are reflected in the social history.    Sleep patterns:    Home Safety/Smoke Alarms: Feels safe in home. Smoke alarms in place.  Living environment; residence and Firearm Safety:                                                           Female:        Mammo-scheduled 04/01/18       Dexa scan-  utd      CCS-2011      Objective:     Vitals: There were no vitals taken for this visit.  There is no height or weight on file to calculate BMI.  Advanced Directives 11/21/2017 11/19/2017 09/11/2017 08/10/2017 05/30/2017 05/27/2017 05/24/2017  Does Patient Have a Medical Advance Directive? No No No No No No No  Does patient want to make changes to medical advance directive? - - - - - - -  Would patient like information on creating a medical advance directive? - Yes (MAU/Ambulatory/Procedural Areas - Information given) Yes (MAU/Ambulatory/Procedural Areas - Information given) No - Patient declined No - Patient declined - -  Pre-existing out of facility DNR order (yellow form or pink MOST form) - - - - - - -    Tobacco Social History   Tobacco Use  Smoking Status Former Smoker  . Packs/day: 1.00  . Years: 50.00  . Pack years: 50.00  . Types: Cigarettes  . Last attempt to quit: 05/22/2007  . Years since quitting: 10.8  Smokeless Tobacco Never Used     Counseling given: Not Answered   Clinical Intake:                       Past Medical History:  Diagnosis Date  . Abdominal pain 08/11/2017  . ALKALINE PHOSPHATASE, ELEVATED 03/15/2009  . Allergic state 06/10/2012  . Anemia   . Anxiety   . Anxiety and depression 04/28/2011  . Arthritis   . Atypical chest pain 11/30/2011  . 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 Baker's cyst of knee 05/22/2011  . Cancer ([place])  01,  08   XRT/chemo 01-02/ lobular invasive ca  . Carotid artery disease ([place])    a. Carotid duplex 03/2014: stable 1-39% BICA, f/u due 03/2016.  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    [name] COPD (chronic obstructive pulmonary disease) ([place])   . Dermatitis 11/23/2012  . Dysphagia   . EE (eosinophilic esophagitis)   . Elevated sed rate 08/02/2013  . Esophageal ring   . ESOPHAGEAL STRICTURE 03/29/2009  . Fall 11/23/2012  . Family history of breast cancer   . Family history of colon cancer   . Family history of ovarian cancer   . Family history of pancreatic cancer   . Folliculitis of nose 2/53/6644  . GERD (gastroesophageal reflux disease) 09/29/2009   improved s/p cholecystectomy and esophagus dilatation  . Hematuria   . History of chicken pox   . History of measles   . History of shingles  2 episodes  . Hx of echocardiogram    a. Echo 01/2013: mild LVH, EF 55-60%, normal wall motion, Gr 1 diast dysfn  . Hyperlipidemia   . Knee pain, bilateral 07/23/2011  . Medicare annual wellness visit, subsequent 06/19/2015  . Mixed hyperlipidemia 10/17/2010   Qualifier: Diagnosis of  By: Mack Guise    . Orthostasis   . Osteopenia 03/14/2011  . Personal history of chemotherapy 2001  . Personal history of radiation therapy 2001   rt breast  . 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 Pneumonia 2-10   pleurisy  . PVC's (premature ventricular contractions)    a. Event monitor 01/2013: NSR, extensive PVCs.  . Radial neck fracture 10/2011   minimally displaced  . Sinusitis, acute 12/04/2015  . Trauma 10/18/2011  . URI (upper respiratory infection) 09/02/2012  . Urinary frequency 12/12/2014  . Urinary incontinence 03/19/2012  . Vaginitis 05/22/2011   Past Surgical History:  Procedure Laterality Date  . ANTERIOR CRUCIATE LIGAMENT REPAIR  08, 09, 10  . AUGMENTATION MAMMAPLASTY Right 05/27/2007  . BREAST BIOPSY Right  01/02/2007   wire loc  . BREAST BIOPSY  12/26/2006  . BREAST LUMPECTOMY Right 2001  . BREAST RECONSTRUCTION  2008, 2009, 2010  . BREAST REDUCTION WITH MASTOPEXY Left 05/30/2017   Procedure: LEFT BREAST REDUCTION FOR SYMTERY WITH MASTOPEXY;  Surgeon: Wallace Going, DO;  Location: [facility];  Service: Plastics;  Laterality: Left;  . CHOLECYSTECTOMY  2010  . COLONOSCOPY  09/05/10   cecal avm's  . DENTAL SURGERY  05/2016   4 dental implants by Dr. Loyal Gambler.  [name] ERCP  2010    CBD stone extraction   . ESOPHAGOGASTRODUODENOSCOPY  01/08/2012   Procedure: ESOPHAGOGASTRODUODENOSCOPY (EGD);  Surgeon: Gatha Mayer, MD;  Location: [facility];  Service: Endoscopy;  Laterality: N/A;  . ESOPHAGOGASTRODUODENOSCOPY (EGD) WITH ESOPHAGEAL DILATION  2010, 2012  . LAPAROSCOPIC APPENDECTOMY N/A 08/04/2016   Procedure: APPENDECTOMY LAPAROSCOPIC;  Surgeon: Michael Boston, MD;  Location: [facility];  Service: General;  Laterality: N/A;  . LIPOSUCTION WITH LIPOFILLING Left 11/21/2017   Procedure: LIPOSUCTION FROM ABDOMEN WITH LIPOFILLING TO LEFT BREAST;  Surgeon: Wallace Going, DO;  Location: [facility];  Service: Plastics;  Laterality: Left;  [name] MASTECTOMY MODIFIED RADICAL Right 05/27/2007   , Mastectomy modified radical (08), breast reconstruction, CA lesions excised lateral abd wall 2010  . MASTOPEXY Left 11/21/2017   Procedure: LEFT BREAST REVISION MASTOPEXY FOR SYMMETRY;  Surgeon: Wallace Going, DO;  Location: [facility];  Service: Plastics;  Laterality: Left;  . SAVORY DILATION  01/08/2012   Procedure: SAVORY DILATION;  Surgeon: Gatha Mayer, MD;  Location: [facility];  Service: Endoscopy;  Laterality: N/A;  need xray   Family History  Problem Relation Age of Onset  . Heart disease Father   . Lung cancer Father        smoker  . Cirrhosis Sister        Primary Biliary  . Stroke Maternal Grandmother   . Alcohol abuse Maternal Grandfather   .  Heart disease Paternal Grandfather   . Anxiety disorder Sister   . Osteoporosis Sister   . Arthritis Sister        Rheumatoid  . Osteoporosis Sister   . Skin cancer Sister        multiple skin cancers, over 69 excisions.  . Other Mother        tic [place]  . Breast cancer Maternal Aunt  dx in her 46s  . Breast cancer Paternal Aunt        dx in her 6s  . Pancreatic cancer Maternal Uncle        dx in his 60s; smoker  . Breast cancer Maternal Aunt        dx in her 51s  . Breast cancer Maternal Aunt        possible breast cancer dx and died in her 41s  . Ovarian cancer Maternal Aunt 29  . Pancreatic cancer Maternal Aunt        dx in her 29s  . Stomach cancer Maternal Uncle   . Colon cancer Maternal Uncle   . Lung cancer Paternal Aunt   . Breast cancer Cousin        paternal first cousin  . Breast cancer Cousin        maternal first cousin  . Anesthesia problems Neg Hx   . Hypotension Neg Hx   . Malignant hyperthermia Neg Hx   . Pseudochol deficiency Neg Hx    Social History   Socioeconomic History  . Marital status: Married    Spouse name: Sam  . Number of children: 3  . Years of education: Not on file  . Highest education level: Not on file  Occupational History  . Occupation: Social worker  Social Needs  . Financial resource strain: Not on file  . Food insecurity:    Worry: Not on file    Inability: Not on file  . Transportation needs:    Medical: Not on file    Non-medical: Not on file  Tobacco Use  . Smoking status: Former Smoker    Packs/day: 1.00    Years: 50.00    Pack years: 50.00    Types: Cigarettes    Last attempt to quit: 05/22/2007    Years since quitting: 10.8  . Smokeless tobacco: Never Used  Substance and Sexual Activity  . Alcohol use: Yes    Comment: social  . Drug use: No  . Sexual activity: Yes    Partners: Male    Comment: lives with husband,   Lifestyle  . Physical activity:    Days per week: Not on file    Minutes per  session: Not on file  . Stress: Not on file  Relationships  . Social connections:    Talks on phone: Not on file    Gets together: Not on file    Attends religious service: Not on file    Active member of club or organization: Not on file    Attends meetings of clubs or organizations: Not on file    Relationship status: Not on file  Other Topics Concern  . Not on file  Social History Narrative  . Not on file    Outpatient Encounter Medications as of 03/14/2018  Medication Sig  . acetaminophen (TYLENOL) 500 MG tablet Take 500-1,000 mg by mouth every 6 (six) hours as needed (for pain.).  [name] amitriptyline (ELAVIL) 150 MG tablet Take 1 tablet (150 mg total) by mouth at bedtime.  [name] aspirin EC 81 MG tablet Take 81 mg by mouth daily.  [name] atorvastatin (LIPITOR) 20 MG tablet TAKE 1 TABLET(20 MG) BY MOUTH TWICE DAILY  . atorvastatin (LIPITOR) 20 MG tablet TAKE 1 TABLET(20 MG) BY MOUTH TWICE DAILY  . Cholecalciferol (VITAMIN D3) 1000 UNITS CAPS Take 2,000 Units by mouth daily.   . Cyanocobalamin (VITAMIN B 12 PO) Take 1 tablet by mouth daily.  [name] diltiazem (  CARDIZEM) 30 MG tablet Take 1 tablet (30 mg total) by mouth daily. take 1 tablet by mouth q am FOR PALPITATIONS  . ferrous sulfate 325 (65 FE) MG tablet Take 325 mg by mouth daily with breakfast.  . LORazepam (ATIVAN) 1 MG tablet Take 1 tablet (1 mg total) by mouth every 8 (eight) hours as needed for anxiety.  . pantoprazole (PROTONIX) 40 MG tablet TAKE 1 TABLET(40 MG) BY MOUTH DAILY  . Probiotic Product (PROBIOTIC FORMULA PO) Take 1 capsule by mouth daily.  [name] triamcinolone cream (KENALOG) 0.1 % Apply topically 2 (two) times daily. Apply to affected area  . Wheat Dextrin (BENEFIBER ON THE GO) PACK Take 1 Package by mouth daily.   No facility-administered encounter medications on file as of 03/14/2018.     Activities of Daily Living In your present state of health, do you have any difficulty performing the following activities: 08/11/2017  05/30/2017  Hearing? N N  Vision? N N  Comment uses reading glasses -  Difficulty concentrating or making decisions? N N  Walking or climbing stairs? Y N  Dressing or bathing? N N  Doing errands, shopping? N -  Some recent data might be hidden    Patient Care Team: [name], MD as PCP - General (Family Medicine) Paralee Cancel, MD as Consulting Physician (Orthopedic Surgery) Fay Records, MD as Consulting Physician (Cardiology) Gordy Levan, MD as Consulting Physician (Oncology) Gatha Mayer, MD as Consulting Physician (Gastroenterology) Clent Jacks, MD as Consulting Physician (Ophthalmology) Roney Jaffe, DDS (Oral Surgery) Everlene Other (Dentistry) Haver[place], Jennefer Bravo, MD as Referring Physician (Dermatology) Dillingham, Loel Lofty, DO as Attending Physician (Plastic Surgery)    Assessment:   This is a routine wellness examination for Anne [name].  Physical assessment deferred to PCP.    Exercise Activities and Dietary recommendations   Diet (meal preparation, eat out, water intake, caffeinated beverages, dairy products, fruits and vegetables): {Desc; diets:16563} Breakfast: Lunch:  Dinner:      Goals    None      Fall Risk Fall Risk  03/11/2017 05/10/2016 12/07/2014 10/18/2014 07/31/2013  Falls in the past year? No Yes Yes Yes Yes  Number falls in past yr: - 1 2 or more 1 2 or more  Comment - - - - 3 or more  Injury with Fall? - Yes - No -  Risk Factor Category  - - - - High Fall Risk  Risk for fall due to : - - - - Other (Comment)  Risk for fall due to: Comment - - - - pt states BP got to low     Depression Screen PHQ 2/9 Scores 03/11/2017 05/10/2016 12/07/2014 07/31/2013  PHQ - 2 Score 2 0 3 0  PHQ- 9 Score 4 - 7 -     Cognitive Function MMSE - Mini Mental State Exam 03/11/2017  Orientation to time 5  Orientation to Place 5  Registration 3  Attention/ Calculation 5  Recall 3  Language- name 2 objects 2  Language- repeat 1  Language- follow 3  step command 3  Language- read & follow direction 1  Write a sentence 1  Copy design 1  Total score 30        Immunization History  Administered Date(s) Administered  . Influenza Split 08/27/2011, 10/21/2012  . Influenza, High Dose Seasonal PF 08/01/2016, 09/19/2017  . Influenza,inj,Quad PF,6+ Mos 07/31/2013, 12/07/2014, 11/01/2015  . Tdap 04/08/2013   Screening Tests Health Maintenance  Topic Date Due  .  PNA vac Low Risk Adult (1 of 2 - PCV13) 09/09/2007  . INFLUENZA VACCINE  06/12/2018  . COLONOSCOPY  09/05/2020  . TETANUS/TDAP  04/09/2023  . DEXA SCAN  Completed      Plan:   ***   I have personally reviewed and noted the following in the patient's chart:   . Medical and social history . Use of alcohol, tobacco or illicit drugs  . Current medications and supplements . Functional ability and status . Nutritional status . Physical activity . Advanced directives . List of other physicians . Hospitalizations, surgeries, and ER visits in previous 12 months . Vitals . Screenings to include cognitive, depression, and falls . Referrals and appointments  In addition, I have reviewed and discussed with patient certain preventive protocols, quality metrics, and best practice recommendations. A written personalized care plan for preventive services as well as general preventive health recommendations were provided to patient.     Ellicia, [place], [place]  03/12/2018

## 2018-03-13 ENCOUNTER — Ambulatory Visit: Payer: Medicare HMO | Admitting: *Deleted

## 2018-03-14 ENCOUNTER — Ambulatory Visit: Payer: PPO | Admitting: *Deleted

## 2018-03-14 ENCOUNTER — Ambulatory Visit: Payer: PPO | Admitting: Internal Medicine

## 2018-03-15 ENCOUNTER — Encounter: Payer: Self-pay | Admitting: Family Medicine

## 2018-03-17 ENCOUNTER — Other Ambulatory Visit: Payer: Self-pay | Admitting: Family Medicine

## 2018-03-17 ENCOUNTER — Encounter: Payer: Self-pay | Admitting: *Deleted

## 2018-03-17 ENCOUNTER — Ambulatory Visit (INDEPENDENT_AMBULATORY_CARE_PROVIDER_SITE_OTHER): Payer: PPO | Admitting: *Deleted

## 2018-03-17 VITALS — BP 102/60 | HR 87 | Ht 65.0 in | Wt 124.2 lb

## 2018-03-17 DIAGNOSIS — Z0000 Encounter for general adult medical examination without abnormal findings: Secondary | ICD-10-CM | POA: Diagnosis not present

## 2018-03-17 MED ORDER — CEPHALEXIN 500 MG PO CAPS: 500.0000 mg | ORAL_CAPSULE | Freq: Four times a day (QID) | ORAL | 0 refills | Status: DC

## 2018-03-17 NOTE — Patient Instructions (Addendum)
Follow up with Dr.[name] as directed  Schedule appointment to follow up with me in 1 year  Continue to eat heart healthy diet (full of fruits, vegetables, whole grains, lean protein, water--limit salt, fat, and sugar intake) and increase physical activity as tolerated.  Continue doing brain stimulating activities (puzzles, reading, adult coloring books, staying active) to keep memory sharp.   Bring a copy of your living will and/or healthcare power of attorney to your next office visit.   Anne [name] , Thank you for taking time to come for your Medicare Wellness Visit. I appreciate your ongoing commitment to your health goals. Please review the following plan we discussed and let me know if I can assist you in the future.   These are the goals we discussed: Goals    . DIET - INCREASE WATER INTAKE       This is a list of the screening recommended for you and due dates:  Health Maintenance  Topic Date Due  . Pneumonia vaccines (1 of 2 - PCV13) 09/09/2007  . Flu Shot  06/12/2018  . Colon Cancer Screening  09/05/2020  . Tetanus Vaccine  04/09/2023  . DEXA scan (bone density measurement)  Completed    Health Maintenance for Postmenopausal Women Menopause is a normal process in which your reproductive ability comes to an end. This process happens gradually over a span of months to years, usually between the ages of 19 and 28. Menopause is complete when you have missed 12 consecutive menstrual periods. It is important to talk with your health care provider about some of the most common conditions that affect postmenopausal women, such as heart disease, cancer, and bone loss (osteoporosis). Adopting a healthy lifestyle and getting preventive care can help to promote your health and wellness. Those actions can also lower your chances of developing some of these common conditions. What should I know about menopause? During menopause, you may experience a number of symptoms, such  as:  Moderate-to-severe hot flashes.  Night sweats.  Decrease in sex drive.  Mood swings.  Headaches.  Tiredness.  Irritability.  Memory problems.  Insomnia.  Choosing to treat or not to treat menopausal changes is an individual decision that you make with your health care provider. What should I know about hormone replacement therapy and supplements? Hormone therapy products are effective for treating symptoms that are associated with menopause, such as hot flashes and night sweats. Hormone replacement carries certain risks, especially as you become older. If you are thinking about using estrogen or estrogen with progestin treatments, discuss the benefits and risks with your health care provider. What should I know about heart disease and stroke? Heart disease, heart attack, and stroke become more likely as you age. This may be due, in part, to the hormonal changes that your body experiences during menopause. These can affect how your body processes dietary fats, triglycerides, and cholesterol. Heart attack and stroke are both medical emergencies. There are many things that you can do to help prevent heart disease and stroke:  Have your blood pressure checked at least every 1-2 years. High blood pressure causes heart disease and increases the risk of stroke.  If you are 3-45 years old, ask your health care provider if you should take aspirin to prevent a heart attack or a stroke.  Do not use any tobacco products, including cigarettes, chewing tobacco, or electronic cigarettes. If you need help quitting, ask your health care provider.  It is important to eat a healthy diet  and maintain a healthy weight. ? Be sure to include plenty of vegetables, fruits, low-fat dairy products, and lean protein. ? Avoid eating foods that are high in solid fats, added sugars, or salt (sodium).  Get regular exercise. This is one of the most important things that you can do for your health. ? Try  to exercise for at least 150 minutes each week. The type of exercise that you do should increase your heart rate and make you sweat. This is known as moderate-intensity exercise. ? Try to do strengthening exercises at least twice each week. Do these in addition to the moderate-intensity exercise.  Know your numbers.Ask your health care provider to check your cholesterol and your blood glucose. Continue to have your blood tested as directed by your health care provider.  What should I know about cancer screening? There are several types of cancer. Take the following steps to reduce your risk and to catch any cancer development as early as possible. Breast Cancer  Practice breast self-awareness. ? This means understanding how your breasts normally appear and feel. ? It also means doing regular breast self-exams. Let your health care provider know about any changes, no matter how small.  If you are 43 or older, have a clinician do a breast exam (clinical breast exam or CBE) every year. Depending on your age, family history, and medical history, it may be recommended that you also have a yearly breast X-ray (mammogram).  If you have a family history of breast cancer, talk with your health care provider about genetic screening.  If you are at high risk for breast cancer, talk with your health care provider about having an MRI and a mammogram every year.  Breast cancer (BRCA) gene test is recommended for women who have family members with BRCA-related cancers. Results of the assessment will determine the need for genetic counseling and BRCA1 and for BRCA2 testing. BRCA-related cancers include these types: ? Breast. This occurs in males or females. ? Ovarian. ? Tubal. This may also be called fallopian tube cancer. ? Cancer of the abdominal or pelvic lining (peritoneal cancer). ? Prostate. ? Pancreatic.  Cervical, Uterine, and Ovarian Cancer Your health care provider may recommend that you be  screened regularly for cancer of the pelvic organs. These include your ovaries, uterus, and vagina. This screening involves a pelvic exam, which includes checking for microscopic changes to the surface of your cervix (Pap test).  For women ages 21-65, health care providers may recommend a pelvic exam and a Pap test every three years. For women ages 63-65, they may recommend the Pap test and pelvic exam, combined with testing for human papilloma virus (HPV), every five years. Some types of HPV increase your risk of cervical cancer. Testing for HPV may also be done on women of any age who have unclear Pap test results.  Other health care providers may not recommend any screening for nonpregnant women who are considered low risk for pelvic cancer and have no symptoms. Ask your health care provider if a screening pelvic exam is right for you.  If you have had past treatment for cervical cancer or a condition that could lead to cancer, you need Pap tests and screening for cancer for at least 20 years after your treatment. If Pap tests have been discontinued for you, your risk factors (such as having a new sexual partner) need to be reassessed to determine if you should start having screenings again. Some women have medical problems that increase  the chance of getting cervical cancer. In these cases, your health care provider may recommend that you have screening and Pap tests more often.  If you have a family history of uterine cancer or ovarian cancer, talk with your health care provider about genetic screening.  If you have vaginal bleeding after reaching menopause, tell your health care provider.  There are currently no reliable tests available to screen for ovarian cancer.  Lung Cancer Lung cancer screening is recommended for adults 66-27 years old who are at high risk for lung cancer because of a history of smoking. A yearly low-dose CT scan of the lungs is recommended if you:  Currently  smoke.  Have a history of at least 30 pack-years of smoking and you currently smoke or have quit within the past 15 years. A pack-year is smoking an average of one pack of cigarettes per day for one year.  Yearly screening should:  Continue until it has been 15 years since you quit.  Stop if you develop a health problem that would prevent you from having lung cancer treatment.  Colorectal Cancer  This type of cancer can be detected and can often be prevented.  Routine colorectal cancer screening usually begins at age 3 and continues through age 17.  If you have risk factors for colon cancer, your health care provider may recommend that you be screened at an earlier age.  If you have a family history of colorectal cancer, talk with your health care provider about genetic screening.  Your health care provider may also recommend using home test kits to check for hidden blood in your stool.  A small camera at the end of a tube can be used to examine your colon directly (sigmoidoscopy or colonoscopy). This is done to check for the earliest forms of colorectal cancer.  Direct examination of the colon should be repeated every 5-10 years until age 34. However, if early forms of precancerous polyps or small growths are found or if you have a family history or genetic risk for colorectal cancer, you may need to be screened more often.  Skin Cancer  Check your skin from head to toe regularly.  Monitor any moles. Be sure to tell your health care provider: ? About any new moles or changes in moles, especially if there is a change in a mole's shape or color. ? If you have a mole that is larger than the size of a pencil eraser.  If any of your family members has a history of skin cancer, especially at a young age, talk with your health care provider about genetic screening.  Always use sunscreen. Apply sunscreen liberally and repeatedly throughout the day.  Whenever you are outside, protect  yourself by wearing long sleeves, pants, a wide-brimmed hat, and sunglasses.  What should I know about osteoporosis? Osteoporosis is a condition in which bone destruction happens more quickly than new bone creation. After menopause, you may be at an increased risk for osteoporosis. To help prevent osteoporosis or the bone fractures that can happen because of osteoporosis, the following is recommended:  If you are 41-44 years old, get at least 1,000 mg of calcium and at least 600 mg of vitamin D per day.  If you are older than age 32 but younger than age 9, get at least 1,200 mg of calcium and at least 600 mg of vitamin D per day.  If you are older than age 67, get at least 1,200 mg of calcium and  at least 800 mg of vitamin D per day.  Smoking and excessive alcohol intake increase the risk of osteoporosis. Eat foods that are rich in calcium and vitamin D, and do weight-bearing exercises several times each week as directed by your health care provider. What should I know about how menopause affects my mental health? Depression may occur at any age, but it is more common as you become older. Common symptoms of depression include:  Low or sad mood.  Changes in sleep patterns.  Changes in appetite or eating patterns.  Feeling an overall lack of motivation or enjoyment of activities that you previously enjoyed.  Frequent crying spells.  Talk with your health care provider if you think that you are experiencing depression. What should I know about immunizations? It is important that you get and maintain your immunizations. These include:  Tetanus, diphtheria, and pertussis (Tdap) booster vaccine.  Influenza every year before the flu season begins.  Pneumonia vaccine.  Shingles vaccine.  Your health care provider may also recommend other immunizations. This information is not intended to replace advice given to you by your health care provider. Make sure you discuss any questions you  have with your health care provider. Document Released: 12/21/2005 Document Revised: 05/18/2016 Document Reviewed: 08/02/2015 [employer] Interactive Patient Education  2018 [employer].

## 2018-03-17 NOTE — Progress Notes (Addendum)
Subjective:   Anne [name] is a 77 y.o. female who presents for Medicare Annual (Subsequent) preventive examination.  Review of Systems: No ROS.  Medicare Wellness Visit. Additional risk factors are reflected in the social history.  Cardiac Risk Factors include: advanced age (>43men, >75 women);dyslipidemia Sleep patterns: Sleeps well with Ativan and Elavil.   Home Safety/Smoke Alarms: Feels safe in home. Smoke alarms in place.  Living environment; residence and Firearm Safety: Lives with husband in 2 story home.   Female:      Mammo-   Scheduled 04/01/18    Dexa scan- will have with mammogram.       CCS-2011-normal     Objective:     Vitals: BP 102/60 (BP Location: Left Arm, Patient Position: Sitting, Cuff Size: Normal)   Pulse 87   Ht 5\' 5"  (1.651 m)   Wt 124 lb 3.2 oz (56.3 kg)   SpO2 96%   BMI 20.67 kg/m   Body mass index is 20.67 kg/m.  Advanced Directives 03/17/2018 11/21/2017 11/19/2017 09/11/2017 08/10/2017 05/30/2017 05/27/2017  Does Patient Have a Medical Advance Directive? No No No No No No No  Does patient want to make changes to medical advance directive? - - - - - - -  Would patient like information on creating a medical advance directive? Yes (MAU/Ambulatory/Procedural Areas - Information given) - Yes (MAU/Ambulatory/Procedural Areas - Information given) Yes (MAU/Ambulatory/Procedural Areas - Information given) No - Patient declined No - Patient declined -  Pre-existing out of facility DNR order (yellow form or pink MOST form) - - - - - - -    Tobacco Social History   Tobacco Use  Smoking Status Former Smoker  . Packs/day: 1.00  . Years: 50.00  . Pack years: 50.00  . Types: Cigarettes  . Last attempt to quit: 05/22/2007  . Years since quitting: 10.8  Smokeless Tobacco Never Used     Counseling given: Not Answered   Clinical Intake: Pain : No/denies pain     Past Medical History:  Diagnosis Date  . Abdominal pain 08/11/2017  . ALKALINE  PHOSPHATASE, ELEVATED 03/15/2009  . Allergic state 06/10/2012  . Anemia   . Anxiety   . Anxiety and depression 04/28/2011  . Arthritis   . Atypical chest pain 11/30/2011  . 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 Baker's cyst of knee 05/22/2011  . 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 Carotid artery disease ([place])    a. Carotid duplex 03/2014: stable 1-39% BICA, f/u due 03/2016.  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    [name] COPD (chronic obstructive pulmonary disease) ([place])   . Dermatitis 11/23/2012  . Dysphagia   . EE (eosinophilic esophagitis)   . Elevated sed rate 08/02/2013  . Esophageal ring   . ESOPHAGEAL STRICTURE 03/29/2009  . Fall 11/23/2012  . Family history of breast cancer   . Family history of colon cancer   . Family history of ovarian cancer   . Family history of pancreatic cancer   . Folliculitis of nose 6/73/4193  . GERD (gastroesophageal reflux disease) 09/29/2009   improved s/p cholecystectomy and esophagus dilatation  . Hematuria   . History of chicken pox   . History of measles   . History of shingles    2 episodes  . Hx of echocardiogram    a. Echo 01/2013: mild LVH,  EF 55-60%, normal wall motion, Gr 1 diast dysfn  . Hyperlipidemia   . Knee pain, bilateral 07/23/2011  . Medicare annual wellness visit, subsequent 06/19/2015  . Mixed hyperlipidemia 10/17/2010   Qualifier: Diagnosis of  By: Mack Guise    . Orthostasis   . Osteopenia 03/14/2011  . Personal history of chemotherapy 2001  . Personal history of radiation therapy 2001   rt breast  . 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 Pneumonia 2-10   pleurisy  . PVC's (premature ventricular contractions)    a. Event monitor 01/2013: NSR, extensive PVCs.  . Radial neck fracture 10/2011   minimally displaced  . Sinusitis, acute 12/04/2015  . Trauma 10/18/2011  . URI (upper respiratory  infection) 09/02/2012  . Urinary frequency 12/12/2014  . Urinary incontinence 03/19/2012  . Vaginitis 05/22/2011   Past Surgical History:  Procedure Laterality Date  . ANTERIOR CRUCIATE LIGAMENT REPAIR  08, 09, 10  . AUGMENTATION MAMMAPLASTY Right 05/27/2007  . BREAST BIOPSY Right 01/02/2007   wire loc  . BREAST BIOPSY  12/26/2006  . BREAST LUMPECTOMY Right 2001  . BREAST RECONSTRUCTION  2008, 2009, 2010  . BREAST REDUCTION WITH MASTOPEXY Left 05/30/2017   Procedure: LEFT BREAST REDUCTION FOR SYMTERY WITH MASTOPEXY;  Surgeon: Wallace Going, DO;  Location: [facility];  Service: Plastics;  Laterality: Left;  . CHOLECYSTECTOMY  2010  . COLONOSCOPY  09/05/10   cecal avm's  . DENTAL SURGERY  05/2016   4 dental implants by Dr. Loyal Gambler.  [name] ERCP  2010    CBD stone extraction   . ESOPHAGOGASTRODUODENOSCOPY  01/08/2012   Procedure: ESOPHAGOGASTRODUODENOSCOPY (EGD);  Surgeon: Gatha Mayer, MD;  Location: [facility];  Service: Endoscopy;  Laterality: N/A;  . ESOPHAGOGASTRODUODENOSCOPY (EGD) WITH ESOPHAGEAL DILATION  2010, 2012  . LAPAROSCOPIC APPENDECTOMY N/A 08/04/2016   Procedure: APPENDECTOMY LAPAROSCOPIC;  Surgeon: Michael Boston, MD;  Location: [facility];  Service: General;  Laterality: N/A;  . LIPOSUCTION WITH LIPOFILLING Left 11/21/2017   Procedure: LIPOSUCTION FROM ABDOMEN WITH LIPOFILLING TO LEFT BREAST;  Surgeon: Wallace Going, DO;  Location: [facility];  Service: Plastics;  Laterality: Left;  [name] MASTECTOMY MODIFIED RADICAL Right 05/27/2007   , Mastectomy modified radical (08), breast reconstruction, CA lesions excised lateral abd wall 2010  . MASTOPEXY Left 11/21/2017   Procedure: LEFT BREAST REVISION MASTOPEXY FOR SYMMETRY;  Surgeon: Wallace Going, DO;  Location: [facility];  Service: Plastics;  Laterality: Left;  . SAVORY DILATION  01/08/2012   Procedure: SAVORY DILATION;  Surgeon: Gatha Mayer, MD;  Location: [facility];   Service: Endoscopy;  Laterality: N/A;  need xray   Family History  Problem Relation Age of Onset  . Heart disease Father   . Lung cancer Father        smoker  . Cirrhosis Sister        Primary Biliary  . Stroke Maternal Grandmother   . Alcohol abuse Maternal Grandfather   . Heart disease Paternal Grandfather   . Anxiety disorder Sister   . Osteoporosis Sister   . Arthritis Sister        Rheumatoid  . Osteoporosis Sister   . Skin cancer Sister        multiple skin cancers, over 7 excisions.  . Other Mother        tic [place]  . Breast cancer Maternal Aunt        dx in her 31s  . Breast cancer Paternal Aunt  dx in her 56s  . Pancreatic cancer Maternal Uncle        dx in his 61s; smoker  . Breast cancer Maternal Aunt        dx in her 63s  . Breast cancer Maternal Aunt        possible breast cancer dx and died in her 74s  . Ovarian cancer Maternal Aunt 29  . Pancreatic cancer Maternal Aunt        dx in her 59s  . Stomach cancer Maternal Uncle   . Colon cancer Maternal Uncle   . Lung cancer Paternal Aunt   . Breast cancer Cousin        paternal first cousin  . Breast cancer Cousin        maternal first cousin  . Anesthesia problems Neg Hx   . Hypotension Neg Hx   . Malignant hyperthermia Neg Hx   . Pseudochol deficiency Neg Hx    Social History   Socioeconomic History  . Marital status: Married    Spouse name: Sam  . Number of children: 3  . Years of education: Not on file  . Highest education level: Not on file  Occupational History  . Occupation: Social worker  Social Needs  . Financial resource strain: Not on file  . Food insecurity:    Worry: Not on file    Inability: Not on file  . Transportation needs:    Medical: Not on file    Non-medical: Not on file  Tobacco Use  . Smoking status: Former Smoker    Packs/day: 1.00    Years: 50.00    Pack years: 50.00    Types: Cigarettes    Last attempt to quit: 05/22/2007    Years since quitting: 10.8    . Smokeless tobacco: Never Used  Substance and Sexual Activity  . Alcohol use: Yes    Comment: social  . Drug use: No  . Sexual activity: Yes    Partners: Male    Comment: lives with husband,   Lifestyle  . Physical activity:    Days per week: Not on file    Minutes per session: Not on file  . Stress: Not on file  Relationships  . Social connections:    Talks on phone: Not on file    Gets together: Not on file    Attends religious service: Not on file    Active member of club or organization: Not on file    Attends meetings of clubs or organizations: Not on file    Relationship status: Not on file  Other Topics Concern  . Not on file  Social History Narrative  . Not on file    Outpatient Encounter Medications as of 03/17/2018  Medication Sig  . acetaminophen (TYLENOL) 500 MG tablet Take 500-1,000 mg by mouth every 6 (six) hours as needed (for pain.).  [name] amitriptyline (ELAVIL) 150 MG tablet Take 1 tablet (150 mg total) by mouth at bedtime.  [name] aspirin EC 81 MG tablet Take 81 mg by mouth daily.  [name] atorvastatin (LIPITOR) 20 MG tablet TAKE 1 TABLET(20 MG) BY MOUTH TWICE DAILY (Patient taking differently: TAKE 2 TABLET(20 MG) BY MOUTH once DAILY)  . Cholecalciferol (VITAMIN D3) 1000 UNITS CAPS Take 2,000 Units by mouth daily.   . Cyanocobalamin (VITAMIN B 12 PO) Take 1 tablet by mouth daily.  [name] diltiazem (CARDIZEM) 30 MG tablet Take 1 tablet (30 mg total) by mouth daily. take 1 tablet by mouth q am  FOR PALPITATIONS  . ferrous sulfate 325 (65 FE) MG tablet Take 325 mg by mouth daily with breakfast.  . LORazepam (ATIVAN) 1 MG tablet Take 1 tablet (1 mg total) by mouth every 8 (eight) hours as needed for anxiety.  [name] MAGNESIUM PO Take by mouth.  . pantoprazole (PROTONIX) 40 MG tablet TAKE 1 TABLET(40 MG) BY MOUTH DAILY  . triamcinolone cream (KENALOG) 0.1 % Apply topically 2 (two) times daily. Apply to affected area  . Wheat Dextrin (BENEFIBER ON THE GO) PACK Take 1 Package by mouth  daily.  . Probiotic Product (PROBIOTIC FORMULA PO) Take 1 capsule by mouth daily.  . [DISCONTINUED] atorvastatin (LIPITOR) 20 MG tablet TAKE 1 TABLET(20 MG) BY MOUTH TWICE DAILY   No facility-administered encounter medications on file as of 03/17/2018.     Activities of Daily Living In your present state of health, do you have any difficulty performing the following activities: 03/17/2018 08/11/2017  Hearing? N N  Vision? N N  Comment wears reading glasses.  uses reading glasses  Difficulty concentrating or making decisions? Y N  Walking or climbing stairs? N Y  Dressing or bathing? N N  Doing errands, shopping? N N  Preparing Food and eating ? N -  Using the Toilet? N -  In the past six months, have you accidently leaked urine? N -  Do you have problems with loss of bowel control? N -  Managing your Medications? N -  Managing your Finances? N -  Housekeeping or managing your Housekeeping? N -  Some recent data might be hidden    Patient Care Team: [name], MD as PCP - General (Family Medicine) Paralee Cancel, MD as Consulting Physician (Orthopedic Surgery) Fay Records, MD as Consulting Physician (Cardiology) Gatha Mayer, MD as Consulting Physician (Gastroenterology) Clent Jacks, MD as Consulting Physician (Ophthalmology) Roney Jaffe, DDS (Oral Surgery) Everlene Other (Dentistry) [place], Jennefer Bravo, MD as Referring Physician (Dermatology) Dillingham, Loel Lofty, DO as Attending Physician (Plastic Surgery) Truitt Merle, MD as Consulting Physician (Hematology)    Assessment:   This is a routine wellness examination for Anne [name]. Physical assessment deferred to PCP.  Exercise Activities and Dietary recommendations Current Exercise Habits: The patient does not participate in regular exercise at present, Exercise limited by: None identified   Diet (meal preparation, eat out, water intake, caffeinated beverages, dairy products, fruits and vegetables): in general, a  "healthy" diet  , well balanced Breakfast: cereal and fruit. coffee Lunch: Peanut butter crackers, soda. Dinner:  Vegetables, apples, beans. Tea.  Goals    . DIET - INCREASE WATER INTAKE       Fall Risk Fall Risk  03/17/2018 03/11/2017 05/10/2016 12/07/2014 10/18/2014  Falls in the past year? No No Yes Yes Yes  Number falls in past yr: - - 1 2 or more 1  Comment - - - - -  Injury with Fall? - - Yes - No  Risk Factor Category  - - - - -  Risk for fall due to : - - - - -  Risk for fall due to: Comment - - - - -    Depression Screen PHQ 2/9 Scores 03/17/2018 03/11/2017 05/10/2016 12/07/2014  PHQ - 2 Score 0 2 0 3  PHQ- 9 Score - 4 - 7     Cognitive Function MMSE - Mini Mental State Exam 03/11/2017  Orientation to time 5  Orientation to Place 5  Registration 3  Attention/ Calculation 5  Recall 3  Language-  name 2 objects 2  Language- repeat 1  Language- follow 3 step command 3  Language- read & follow direction 1  Write a sentence 1  Copy design 1  Total score 30        Immunization History  Administered Date(s) Administered  . Influenza Split 08/27/2011, 10/21/2012  . Influenza, High Dose Seasonal PF 08/01/2016, 09/19/2017  . Influenza,inj,Quad PF,6+ Mos 07/31/2013, 12/07/2014, 11/01/2015  . Tdap 04/08/2013    Screening Tests Health Maintenance  Topic Date Due  . PNA vac Low Risk Adult (1 of 2 - PCV13) 09/09/2007  . INFLUENZA VACCINE  06/12/2018  . COLONOSCOPY  09/05/2020  . TETANUS/TDAP  04/09/2023  . DEXA SCAN  Completed      Plan:   Follow up with Dr.[name] as directed  Continue to eat heart healthy diet (full of fruits, vegetables, whole grains, lean protein, water--limit salt, fat, and sugar intake) and increase physical activity as tolerated.  Continue doing brain stimulating activities (puzzles, reading, adult coloring books, staying active) to keep memory sharp.   Bring a copy of your living will and/or healthcare power of attorney to your next office  visit.  I have personally reviewed and noted the following in the patient's chart:   . Medical and social history . Use of alcohol, tobacco or illicit drugs  . Current medications and supplements . Functional ability and status . Nutritional status . Physical activity . Advanced directives . List of other physicians . Hospitalizations, surgeries, and ER visits in previous 12 months . Vitals . Screenings to include cognitive, depression, and falls . Referrals and appointments  In addition, I have reviewed and discussed with patient certain preventive protocols, quality metrics, and best practice recommendations. A written personalized care plan for preventive services as well as general preventive health recommendations were provided to patient.     Anne [name], [place], [place]  03/17/2018   Medical screening examination/treatment was performed by qualified clinical staff member and as supervising physician I was immediately available for consultation/collaboration. I have reviewed documentation and agree with assessment and plan.  Penni Homans, MD

## 2018-03-20 DIAGNOSIS — Z961 Presence of intraocular lens: Secondary | ICD-10-CM | POA: Diagnosis not present

## 2018-03-20 DIAGNOSIS — H26492 Other secondary cataract, left eye: Secondary | ICD-10-CM | POA: Diagnosis not present

## 2018-03-20 DIAGNOSIS — H353132 Nonexudative age-related macular degeneration, bilateral, intermediate dry stage: Secondary | ICD-10-CM | POA: Diagnosis not present

## 2018-03-20 DIAGNOSIS — H04123 Dry eye syndrome of bilateral lacrimal glands: Secondary | ICD-10-CM | POA: Diagnosis not present

## 2018-03-24 ENCOUNTER — Other Ambulatory Visit: Payer: Self-pay | Admitting: Family Medicine

## 2018-03-24 ENCOUNTER — Encounter: Payer: Self-pay | Admitting: Hematology

## 2018-03-30 ENCOUNTER — Encounter: Payer: Self-pay | Admitting: Family Medicine

## 2018-03-31 ENCOUNTER — Encounter: Payer: Self-pay | Admitting: Family Medicine

## 2018-03-31 ENCOUNTER — Encounter: Payer: Self-pay | Admitting: Internal Medicine

## 2018-04-01 ENCOUNTER — Telehealth: Payer: Self-pay

## 2018-04-01 ENCOUNTER — Ambulatory Visit
Admission: RE | Admit: 2018-04-01 | Discharge: 2018-04-01 | Disposition: A | Discharge status: Home / Self Care | Payer: PPO | Source: Ambulatory Visit | Attending: Hematology | Admitting: Hematology

## 2018-04-01 DIAGNOSIS — Z1231 Encounter for screening mammogram for malignant neoplasm of breast: Secondary | ICD-10-CM

## 2018-04-01 NOTE — Telephone Encounter (Signed)
Left message for patient to call back  

## 2018-04-01 NOTE — Telephone Encounter (Signed)
-----   Message from Gatha Mayer, MD sent at 04/01/2018  8:45 AM EDT ----- Regarding: call her See My Chart note  Need dysphagia hx last few mos  She will either need budesonide or dilation again or both  What meds is she currently taking also?

## 2018-04-03 ENCOUNTER — Encounter: Payer: Self-pay | Admitting: Physician Assistant

## 2018-04-03 ENCOUNTER — Ambulatory Visit (INDEPENDENT_AMBULATORY_CARE_PROVIDER_SITE_OTHER): Payer: PPO | Admitting: Physician Assistant

## 2018-04-03 VITALS — BP 106/72 | HR 86 | Ht 65.0 in | Wt 125.4 lb

## 2018-04-03 DIAGNOSIS — E782 Mixed hyperlipidemia: Secondary | ICD-10-CM

## 2018-04-03 DIAGNOSIS — R0602 Shortness of breath: Secondary | ICD-10-CM

## 2018-04-03 DIAGNOSIS — R0609 Other forms of dyspnea: Secondary | ICD-10-CM | POA: Diagnosis not present

## 2018-04-03 DIAGNOSIS — I493 Ventricular premature depolarization: Secondary | ICD-10-CM

## 2018-04-03 NOTE — Progress Notes (Signed)
Cardiology Office Note    Date:  04/03/2018   ID:  Anne [name], Anne [name] 1941-08-22, MRN [id number]  PCP:  [name], MD  Cardiologist:  Dr. Harrington Challenger  Chief Complaint: yearly follow up   History of Present Illness:   Anne [name] is a 77 y.o. female with a history of orthostatic intolerance, carotid stenosis, HL, anemia, GERD, eosinophilic esophagitis, breast CA presents for follow up.   Echo 3/14: Mild LVH, EF 93-23%, grade 1 diastolic dysfunction. Event monitor 01/2013: NSR, extensive PVCs. Carotid US 5/57: RICA 3-22%, LICA 02-54%.. Myoview 4/14: Low risk, no scar or ischemia, not gated. She has been treated with low dose metoprolol for PVCs and palps.   She was doing well on cardiac stand point when last seen by Dr. Harrington Challenger 12/2016.  Recent echo 02/2018 showed LVEF of 55-60% with grade 1 DD.  Here today for follow up. Patient has chronic dyspnea on exertion while climbing stair or uphill. Walks on ground surface without issue. No exertional chest pressure/tightness. Compliant with medication. No regular exercise. No orthopnea, PND, syncope, LE edema, palpations or melena. Eats low sodium diet.    Past Medical History:  Diagnosis Date  . Abdominal pain 08/11/2017  . ALKALINE PHOSPHATASE, ELEVATED 03/15/2009  . Allergic state 06/10/2012  . Anemia   . Anxiety   . Anxiety and depression 04/28/2011  . Arthritis   . Atypical chest pain 11/30/2011  . 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 Baker's cyst of knee 05/22/2011  . Cancer ([place]) 01,  08   XRT/chemo 01-02/ lobular invasive ca  . Carotid artery disease ([place])    a. Carotid duplex 03/2014: stable 1-39% BICA, f/u due 03/2016.  [name] Chronic alcoholism in remission ([place]) 03/29/2011   Did not tolerate Klonopin, caused some confusion and bad dreams.    [name] COPD (chronic obstructive pulmonary disease) ([place])   . Dermatitis 11/23/2012  . Dysphagia   . EE (eosinophilic esophagitis)   . Elevated sed rate 08/02/2013  .  Esophageal ring   . ESOPHAGEAL STRICTURE 03/29/2009  . Fall 11/23/2012  . Family history of breast cancer   . Family history of colon cancer   . Family history of ovarian cancer   . Family history of pancreatic cancer   . Folliculitis of nose 2/70/6237  . GERD (gastroesophageal reflux disease) 09/29/2009   improved s/p cholecystectomy and esophagus dilatation  . Hematuria   . History of chicken pox   . History of measles   . History of shingles    2 episodes  . Hx of echocardiogram    a. Echo 01/2013: mild LVH, EF 55-60%, normal wall motion, Gr 1 diast dysfn  . Hyperlipidemia   . Knee pain, bilateral 07/23/2011  . Medicare annual wellness visit, subsequent 06/19/2015  . Mixed hyperlipidemia 10/17/2010   Qualifier: Diagnosis of  By: Mack Guise    . Orthostasis   . Osteopenia 03/14/2011  . Personal history of chemotherapy 2001  . Personal history of radiation therapy 2001   rt breast  . 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 Pneumonia 2-10   pleurisy  . PVC's (premature ventricular contractions)    a. Event monitor 01/2013: NSR, extensive PVCs.  . Radial neck fracture 10/2011   minimally displaced  . Sinusitis, acute 12/04/2015  . Trauma 10/18/2011  . URI (upper respiratory infection) 09/02/2012  . Urinary frequency 12/12/2014  . Urinary incontinence 03/19/2012  . Vaginitis 05/22/2011    Past Surgical  History:  Procedure Laterality Date  . ANTERIOR CRUCIATE LIGAMENT REPAIR  08, 09, 10  . AUGMENTATION MAMMAPLASTY Right 05/27/2007  . BREAST BIOPSY Right 01/02/2007   wire loc  . BREAST BIOPSY  12/26/2006  . BREAST LUMPECTOMY Right 2001  . BREAST RECONSTRUCTION  2008, 2009, 2010  . BREAST REDUCTION WITH MASTOPEXY Left 05/30/2017   Procedure: LEFT BREAST REDUCTION FOR SYMTERY WITH MASTOPEXY;  Surgeon: Wallace Going, DO;  Location: [facility];  Service: Plastics;  Laterality: Left;  . CHOLECYSTECTOMY  2010  . COLONOSCOPY  09/05/10   cecal avm's  . DENTAL  SURGERY  05/2016   4 dental implants by Dr. Loyal Gambler.  [name] ERCP  2010    CBD stone extraction   . ESOPHAGOGASTRODUODENOSCOPY  01/08/2012   Procedure: ESOPHAGOGASTRODUODENOSCOPY (EGD);  Surgeon: Gatha Mayer, MD;  Location: [facility];  Service: Endoscopy;  Laterality: N/A;  . ESOPHAGOGASTRODUODENOSCOPY (EGD) WITH ESOPHAGEAL DILATION  2010, 2012  . LAPAROSCOPIC APPENDECTOMY N/A 08/04/2016   Procedure: APPENDECTOMY LAPAROSCOPIC;  Surgeon: Michael Boston, MD;  Location: [facility];  Service: General;  Laterality: N/A;  . LIPOSUCTION WITH LIPOFILLING Left 11/21/2017   Procedure: LIPOSUCTION FROM ABDOMEN WITH LIPOFILLING TO LEFT BREAST;  Surgeon: Wallace Going, DO;  Location: [facility];  Service: Plastics;  Laterality: Left;  [name] MASTECTOMY MODIFIED RADICAL Right 05/27/2007   , Mastectomy modified radical (08), breast reconstruction, CA lesions excised lateral abd wall 2010  . MASTOPEXY Left 11/21/2017   Procedure: LEFT BREAST REVISION MASTOPEXY FOR SYMMETRY;  Surgeon: Wallace Going, DO;  Location: [facility];  Service: Plastics;  Laterality: Left;  . REDUCTION MAMMAPLASTY Left   . SAVORY DILATION  01/08/2012   Procedure: SAVORY DILATION;  Surgeon: Gatha Mayer, MD;  Location: [facility];  Service: Endoscopy;  Laterality: N/A;  need xray    Current Medications: Prior to Admission medications   Medication Sig Start Date End Date Taking? Authorizing Provider  acetaminophen (TYLENOL) 500 MG tablet Take 500-1,000 mg by mouth every 6 (six) hours as needed (for pain.).    [provider]  amitriptyline (ELAVIL) 150 MG tablet Take 1 tablet (150 mg total) by mouth at bedtime. 11/21/17   [name], MD  aspirin EC 81 MG tablet Take 81 mg by mouth daily.    [provider]  atorvastatin (LIPITOR) 20 MG tablet TAKE 1 TABLET(20 MG) BY MOUTH TWICE DAILY Patient taking differently: TAKE 2 TABLET(20 MG) BY MOUTH once DAILY 02/19/18   [name], MD   cephALEXin (KEFLEX) 500 MG capsule Take 1 capsule (500 mg total) by mouth 4 (four) times daily. 03/17/18   [name], MD  Cholecalciferol (VITAMIN D3) 1000 UNITS CAPS Take 2,000 Units by mouth daily.     [provider]  Cyanocobalamin (VITAMIN B 12 PO) Take 1 tablet by mouth daily.    [provider]  diltiazem (CARDIZEM) 30 MG tablet Take 1 tablet (30 mg total) by mouth daily. take 1 tablet by mouth q am FOR PALPITATIONS 08/12/17   Aline August, MD  ferrous sulfate 325 (65 FE) MG tablet Take 325 mg by mouth daily with breakfast.    [provider]  LORazepam (ATIVAN) 1 MG tablet Take 1 tablet (1 mg total) by mouth every 8 (eight) hours as needed for anxiety. 12/23/17   [name], MD  MAGNESIUM PO Take by mouth.    [provider]  pantoprazole (PROTONIX) 40 MG tablet TAKE 1  TABLET(40 MG) BY MOUTH DAILY 11/04/17   [name], MD  Probiotic Product (PROBIOTIC FORMULA PO) Take 1 capsule by mouth daily.    [provider]  triamcinolone cream (KENALOG) 0.1 % Apply topically 2 (two) times daily. Apply to affected area 02/10/18   [name], MD  Wheat Dextrin (BENEFIBER ON THE GO) PACK Take 1 Package by mouth daily. 08/14/17   Gatha Mayer, MD    Allergies:   Codeine; Morphine; Peanut-containing drug products; Sorbitol; Tomato; Zofran; Advil [ibuprofen]; Diphenhydramine hcl; and Oxycodone   Social History   Socioeconomic History  . Marital status: Married    Spouse name: Sam  . Number of children: 3  . Years of education: Not on file  . Highest education level: Not on file  Occupational History  . Occupation: Social worker  Social Needs  . Financial resource strain: Not on file  . Food insecurity:    Worry: Not on file    Inability: Not on file  . Transportation needs:    Medical: Not on file    Non-medical: Not on file  Tobacco Use  . Smoking status: Former Smoker    Packs/day: 1.00    Years: 50.00    Pack years: 50.00      Types: Cigarettes    Last attempt to quit: 05/22/2007    Years since quitting: 10.8  . Smokeless tobacco: Never Used  Substance and Sexual Activity  . Alcohol use: Yes    Comment: social  . Drug use: No  . Sexual activity: Yes    Partners: Male    Comment: lives with husband,   Lifestyle  . Physical activity:    Days per week: Not on file    Minutes per session: Not on file  . Stress: Not on file  Relationships  . Social connections:    Talks on phone: Not on file    Gets together: Not on file    Attends religious service: Not on file    Active member of club or organization: Not on file    Attends meetings of clubs or organizations: Not on file    Relationship status: Not on file  Other Topics Concern  . Not on file  Social History Narrative  . Not on file     Family History:  The patient's family history includes Alcohol abuse in her maternal grandfather; Anxiety disorder in her sister; Arthritis in her sister; Breast cancer in her cousin, cousin, maternal aunt, maternal aunt, maternal aunt, and paternal aunt; Cirrhosis in her sister; Colon cancer in her maternal uncle; Heart disease in her father and paternal grandfather; Lung cancer in her father and paternal aunt; Osteoporosis in her sister and sister; Other in her mother; Ovarian cancer (age of onset: 60) in her maternal aunt; Pancreatic cancer in her maternal aunt and maternal uncle; Skin cancer in her sister; Stomach cancer in her maternal uncle; Stroke in her maternal grandmother.  ROS:   Please see the history of present illness.    ROS All other systems reviewed and are negative.   PHYSICAL EXAM:   VS:  BP 106/72   Pulse 86   Ht 5\' 5"  (1.651 m)   Wt 125 lb 6.4 oz (56.9 kg)   BMI 20.87 kg/m    GEN: Well nourished, well developed, in no acute distress  HEENT: normal  Neck: no JVD, carotid bruits, or masses Cardiac: RRR; no murmurs, rubs, or gallops,no edema  Respiratory:  clear to auscultation bilaterally,  normal work of breathing GI: soft, nontender, nondistended, + BS MS: no deformity or atrophy  Skin: warm and dry, no rash Neuro:  Alert and Oriented x 3, Strength and sensation are intact Psych: euthymic mood, full affect  Wt Readings from Last 3 Encounters:  04/03/18 125 lb 6.4 oz (56.9 kg)  03/17/18 124 lb 3.2 oz (56.3 kg)  02/03/18 124 lb 9.6 oz (56.5 kg)      Studies/Labs Reviewed:   EKG:  EKG is ordered today.  The ekg ordered today demonstrates NSR. No acute changes.   Recent Labs: 08/05/2017: TSH 2.33 08/10/2017: Magnesium 1.5 09/04/2017: ALT 17; BUN 13.2; Creatinine 0.8; HGB 11.7; Platelets 231; Potassium 3.8; Sodium 141   Lipid Panel    Component Value Date/Time   CHOL 155 08/05/2017 1538   TRIG 145.0 08/05/2017 1538   HDL 61.90 08/05/2017 1538   CHOLHDL 3 08/05/2017 1538   VLDL 29.0 08/05/2017 1538   LDLCALC 64 08/05/2017 1538   LDLDIRECT 117.4 01/13/2013 1210    Additional studies/ records that were reviewed today include:   As above  Echo 02/28/18 Study Conclusions  - Left ventricle: The cavity size was normal. Systolic function was   normal. The estimated ejection fraction was in the range of 55%   to 60%. Wall motion was normal; there were no regional wall   motion abnormalities. Doppler parameters are consistent with   abnormal left ventricular relaxation (grade 1 diastolic   dysfunction). - Aortic valve: There was trivial regurgitation.  Impressions:  - Mormal LVEF.   Relaxation abnormalities that can be related to pts age.   Trace AR. ASSESSMENT & PLAN:    1. Hx of orthostatics -Asymptomatic.   2. HLD - 08/05/2017: Cholesterol 155; HDL 61.90; LDL Cholesterol 64; Triglycerides 145.0; VLDL 29.0  - continue lipitor.   4. Hx of PVCs - No recent issue. Continue to take once a daily short acting cardizem. Not on regular dose due to prior orthotasts.   5. DOE - stable. Recent echo showed normal LVEF with grade 1 DD. No symptoms concern for  CHF. Likely her DOE is due to deconditioning. Encouraged daily exercise. He carries diagnosis of COPD but not on any medications or have seen pulmonologist. Remote PFT. Per patient's request refer to pulmonologist.   Medication Adjustments/Labs and Tests Ordered: Current medicines are reviewed at length with the patient today.  Concerns regarding medicines are outlined above.  Medication changes, Labs and Tests ordered today are listed in the Patient Instructions below. Patient Instructions  Medication Instructions:  Your physician recommends that you continue on your current medications as directed. Please refer to the Current Medication list given to you today.   Labwork: None ordered  Testing/Procedures: None ordered  Follow-Up: Your physician wants you to follow-up in: [place] DR. ROSS You will receive a reminder letter in the mail two months in advance. If you don't receive a letter, please call our office to schedule the follow-up appointment.   You have been referred to PULMONOLOGY.     Any Other Special Instructions Will Be Listed Below (If Applicable).     If you need a refill on your cardiac medications before your next appointment, please call your pharmacy.     Jarrett Soho, [place]  04/03/2018 3:49 PM    [facility] [address], [place], [place]  [address] Phone: [phone]; Fax: [phone]

## 2018-04-03 NOTE — Patient Instructions (Signed)
Medication Instructions:  Your physician recommends that you continue on your current medications as directed. Please refer to the Current Medication list given to you today.   Labwork: None ordered  Testing/Procedures: None ordered  Follow-Up: Your physician wants you to follow-up in: [place] DR. ROSS You will receive a reminder letter in the mail two months in advance. If you don't receive a letter, please call our office to schedule the follow-up appointment.   You have been referred to PULMONOLOGY.     Any Other Special Instructions Will Be Listed Below (If Applicable).     If you need a refill on your cardiac medications before your next appointment, please call your pharmacy.

## 2018-04-03 NOTE — Telephone Encounter (Signed)
Left message for patient to call back  

## 2018-04-04 MED ORDER — AMBULATORY NON FORMULARY MEDICATION: 1 refills | Status: DC

## 2018-04-04 NOTE — Telephone Encounter (Signed)
Patient reports "hard time swallowing" for about a week.  Started after taking magnesium pills. She reports the symptoms started after "getting scratched by magnesium pills".   Patient is not able to go over her medication list at this time.  She will call back she has to "go to [place]".

## 2018-04-04 NOTE — Addendum Note (Signed)
Addended by: Marlon Pel on: 04/04/2018 03:23 PM   Modules accepted: Orders

## 2018-04-04 NOTE — Telephone Encounter (Signed)
She needs budesonide oral solution bid at typical dose we use x 2 weeks and may need an egd and dilation  Let me know

## 2018-04-04 NOTE — Telephone Encounter (Signed)
Patient notified  rx called into [facility].  Patient notified this is a compounded drug and that she should call the pharmacy before going to pick up to make sure it is ready

## 2018-04-08 ENCOUNTER — Encounter: Payer: Self-pay | Admitting: Family Medicine

## 2018-04-08 DIAGNOSIS — R634 Abnormal weight loss: Secondary | ICD-10-CM

## 2018-04-08 DIAGNOSIS — R350 Frequency of micturition: Secondary | ICD-10-CM

## 2018-04-08 DIAGNOSIS — K219 Gastro-esophageal reflux disease without esophagitis: Secondary | ICD-10-CM

## 2018-04-08 DIAGNOSIS — D649 Anemia, unspecified: Secondary | ICD-10-CM

## 2018-04-12 ENCOUNTER — Encounter: Payer: Self-pay | Admitting: Family Medicine

## 2018-04-12 DIAGNOSIS — L989 Disorder of the skin and subcutaneous tissue, unspecified: Secondary | ICD-10-CM

## 2018-04-14 DIAGNOSIS — Z961 Presence of intraocular lens: Secondary | ICD-10-CM | POA: Diagnosis not present

## 2018-04-14 DIAGNOSIS — H5310 Unspecified subjective visual disturbances: Secondary | ICD-10-CM | POA: Diagnosis not present

## 2018-04-14 DIAGNOSIS — H04123 Dry eye syndrome of bilateral lacrimal glands: Secondary | ICD-10-CM | POA: Diagnosis not present

## 2018-04-14 DIAGNOSIS — H26492 Other secondary cataract, left eye: Secondary | ICD-10-CM | POA: Diagnosis not present

## 2018-04-14 DIAGNOSIS — H353132 Nonexudative age-related macular degeneration, bilateral, intermediate dry stage: Secondary | ICD-10-CM | POA: Diagnosis not present

## 2018-04-14 DIAGNOSIS — H3589 Other specified retinal disorders: Secondary | ICD-10-CM | POA: Diagnosis not present

## 2018-04-15 DIAGNOSIS — Z961 Presence of intraocular lens: Secondary | ICD-10-CM | POA: Diagnosis not present

## 2018-04-15 DIAGNOSIS — H353211 Exudative age-related macular degeneration, right eye, with active choroidal neovascularization: Secondary | ICD-10-CM | POA: Diagnosis not present

## 2018-04-15 DIAGNOSIS — H35361 Drusen (degenerative) of macula, right eye: Secondary | ICD-10-CM | POA: Diagnosis not present

## 2018-04-15 DIAGNOSIS — H353131 Nonexudative age-related macular degeneration, bilateral, early dry stage: Secondary | ICD-10-CM | POA: Diagnosis not present

## 2018-04-16 ENCOUNTER — Other Ambulatory Visit (INDEPENDENT_AMBULATORY_CARE_PROVIDER_SITE_OTHER): Payer: PPO

## 2018-04-16 DIAGNOSIS — R634 Abnormal weight loss: Secondary | ICD-10-CM | POA: Diagnosis not present

## 2018-04-16 DIAGNOSIS — R350 Frequency of micturition: Secondary | ICD-10-CM | POA: Diagnosis not present

## 2018-04-16 DIAGNOSIS — K219 Gastro-esophageal reflux disease without esophagitis: Secondary | ICD-10-CM

## 2018-04-16 DIAGNOSIS — D649 Anemia, unspecified: Secondary | ICD-10-CM

## 2018-04-17 LAB — COMPREHENSIVE METABOLIC PANEL
ALBUMIN: 4.3 g/dL (ref 3.5–5.2)
ALT: 23 U/L (ref 0–35)
AST: 23 U/L (ref 0–37)
Alkaline Phosphatase: 100 U/L (ref 39–117)
BUN: 20 mg/dL (ref 6–23)
CALCIUM: 9.6 mg/dL (ref 8.4–10.5)
CHLORIDE: 103 meq/L (ref 96–112)
CO2: 28 mEq/L (ref 19–32)
CREATININE: 0.68 mg/dL (ref 0.40–1.20)
GFR: 89.51 mL/min (ref >60.00)
Glucose, Bld: 105 mg/dL — ABNORMAL HIGH (ref 70–99)
POTASSIUM: 4.4 meq/L (ref 3.5–5.1)
Sodium: 140 mEq/L (ref 135–145)
Total Bilirubin: 0.3 mg/dL (ref 0.2–1.2)
Total Protein: 6.8 g/dL (ref 6.0–8.3)

## 2018-04-17 LAB — CBC WITH DIFFERENTIAL/PLATELET
BASOS PCT: 1.2 % (ref 0.0–3.0)
Basophils Absolute: 0.1 10*3/uL (ref 0.0–0.1)
EOS PCT: 1.8 % (ref 0.0–5.0)
Eosinophils Absolute: 0.2 10*3/uL (ref 0.0–0.7)
HEMATOCRIT: 36.6 % (ref 36.0–46.0)
Hemoglobin: 12.2 g/dL (ref 12.0–15.0)
Lymphocytes Relative: 20 % (ref 12.0–46.0)
Lymphs Abs: 1.7 10*3/uL (ref 0.7–4.0)
MCHC: 33.4 g/dL (ref 30.0–36.0)
MCV: 91.3 fl (ref 78.0–100.0)
Monocytes Absolute: 0.5 10*3/uL (ref 0.1–1.0)
Monocytes Relative: 6.2 % (ref 3.0–12.0)
NEUTROS ABS: 6.1 10*3/uL (ref 1.4–7.7)
Neutrophils Relative %: 70.8 % (ref 43.0–77.0)
PLATELETS: 276 10*3/uL (ref 150.0–400.0)
RBC: 4.01 Mil/uL (ref 3.87–5.11)
RDW: 14.5 % (ref 11.5–15.5)
WBC: 8.6 10*3/uL (ref 4.0–10.5)

## 2018-04-17 LAB — SEDIMENTATION RATE: SED RATE: 43 mm/h — AB (ref 0–30)

## 2018-04-17 LAB — TSH: TSH: 3.26 u[IU]/mL (ref 0.35–4.50)

## 2018-04-20 ENCOUNTER — Encounter: Payer: Self-pay | Admitting: Family Medicine

## 2018-04-22 ENCOUNTER — Encounter: Payer: Self-pay | Admitting: Family Medicine

## 2018-04-24 ENCOUNTER — Encounter: Payer: Self-pay | Admitting: Family Medicine

## 2018-04-26 ENCOUNTER — Emergency Department (INDEPENDENT_AMBULATORY_CARE_PROVIDER_SITE_OTHER): Payer: PPO

## 2018-04-26 ENCOUNTER — Emergency Department (INDEPENDENT_AMBULATORY_CARE_PROVIDER_SITE_OTHER)
Admission: EM | Admit: 2018-04-26 | Discharge: 2018-04-26 | Disposition: A | Discharge status: Home / Self Care | Payer: PPO | Source: Home / Self Care | Attending: Family Medicine | Admitting: Family Medicine

## 2018-04-26 ENCOUNTER — Encounter: Payer: Self-pay | Admitting: Emergency Medicine

## 2018-04-26 DIAGNOSIS — R0602 Shortness of breath: Secondary | ICD-10-CM

## 2018-04-26 DIAGNOSIS — R531 Weakness: Secondary | ICD-10-CM

## 2018-04-26 DIAGNOSIS — R590 Localized enlarged lymph nodes: Secondary | ICD-10-CM

## 2018-04-26 MED ORDER — ALBUTEROL SULFATE HFA 108 (90 BASE) MCG/ACT IN AERS: 1.0000 | INHALATION_SPRAY | Freq: Four times a day (QID) | RESPIRATORY_TRACT | 0 refills | Status: DC | PRN

## 2018-04-26 MED ORDER — AEROCHAMBER PLUS W/MASK MISC: 2 refills | Status: DC

## 2018-04-26 NOTE — Discharge Instructions (Addendum)
°  Please try the inhaler you were prescribed today to see if that helps with your shortness of breath. If your symptoms are not improving, please call your family doctor on Monday for recheck of your symptoms.   Please call 911 or go to the hospital if your chest tightness or shortness of breath is worsening despite the use of the inhaler or if new concerning symptoms develop.

## 2018-04-26 NOTE — ED Provider Notes (Signed)
[name]    CSN: [id number] Arrival date & time: 04/26/18  1312     History   Chief Complaint Chief Complaint  Patient presents with  . Shortness of Breath    HPI Anne [name] is a 77 y.o. female.   HPI Anne [name] is a 77 y.o. female presenting to UC with c/o gradually worsening SOB and generalized weakness for 2 days.  She also reports noticing a swollen lymph node on the Left side of her neck this morning. Denies fever, chills, congestion. She has a minimal cough.  Hx of COPD dx in 2010 but advised she stopped smoking cigarettes in 2008.  She has never used an inhaler for treatment but believes she needs one now.  She was seen by a cardiologist within the last 1 month for SOB but reports having a normal workup including a normal echocardiogram.  She has an appointment with a pulmonologist on June 25th but is requesting medication to help her breathing until then.    Past Medical History:  Diagnosis Date  . Abdominal pain 08/11/2017  . ALKALINE PHOSPHATASE, ELEVATED 03/15/2009  . Allergic state 06/10/2012  . Anemia   . Anxiety   . Anxiety and depression 04/28/2011  . Arthritis   . Atypical chest pain 11/30/2011  . 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 Baker's cyst of knee 05/22/2011  . Cancer ([place]) 01,  08   XRT/chemo 01-02/ lobular invasive ca  . Carotid artery disease ([place])    a. Carotid duplex 03/2014: stable 1-39% BICA, f/u due 03/2016.  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    [name] COPD (chronic obstructive pulmonary disease) ([place])   . Dermatitis 11/23/2012  . Dysphagia   . EE (eosinophilic esophagitis)   . Elevated sed rate 08/02/2013  . Esophageal ring   . ESOPHAGEAL STRICTURE 03/29/2009  . Fall 11/23/2012  . Family history of breast cancer   . Family history of colon cancer   . Family history of ovarian cancer   . Family history of pancreatic cancer     . Folliculitis of nose 2/99/2426  . GERD (gastroesophageal reflux disease) 09/29/2009   improved s/p cholecystectomy and esophagus dilatation  . Hematuria   . History of chicken pox   . History of measles   . History of shingles    2 episodes  . Hx of echocardiogram    a. Echo 01/2013: mild LVH, EF 55-60%, normal wall motion, Gr 1 diast dysfn  . Hyperlipidemia   . Knee pain, bilateral 07/23/2011  . Medicare annual wellness visit, subsequent 06/19/2015  . Mixed hyperlipidemia 10/17/2010   Qualifier: Diagnosis of  By: Mack Guise    . Orthostasis   . Osteopenia 03/14/2011  . Personal history of chemotherapy 2001  . Personal history of radiation therapy 2001   rt breast  . 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 Pneumonia 2-10   pleurisy  . PVC's (premature ventricular contractions)    a. Event monitor 01/2013: NSR, extensive PVCs.  . Radial neck fracture 10/2011   minimally displaced  . Sinusitis, acute 12/04/2015  . Trauma 10/18/2011  . URI (upper respiratory infection) 09/02/2012  . Urinary frequency 12/12/2014  . Urinary incontinence 03/19/2012  . Vaginitis 05/22/2011    Patient Active Problem List   Diagnosis Date Noted  . Skin lesion of face 02/07/2018  . Low back pain 09/23/2017  .  Hypokalemia 08/11/2017  . Abdominal pain 08/11/2017  . SBO (small bowel obstruction) ([place]) 08/10/2017  . History of right breast cancer 03/21/2017  . Genetic testing 09/13/2016  . Family history of breast cancer   . Family history of pancreatic cancer   . Family history of colon cancer   . Pain in the chest 05/16/2016  . Status post right breast reconstruction 05/11/2016  . Breast cancer 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 ([place]) 05/11/2016  . History of breast cancer in female 05/11/2016  . Osteopenia determined by x-ray 05/11/2016  . IBS (irritable bowel syndrome) 03/09/2016  . Dyspnea 06/19/2015  . Medicare annual wellness visit, subsequent 06/19/2015  . Urinary frequency 12/12/2014  . Breast  cancer, right breast ([place]) 10/18/2014  . Superficial thrombophlebitis 03/16/2014  . Plant dermatitis 03/16/2014  . Chest wall pain 02/07/2014  . Primary localized osteoarthrosis, lower leg 01/21/2014  . Elevated sed rate 08/02/2013  . Dermatitis 11/23/2012  . Preventative health care 10/21/2012  . Allergic state 06/10/2012  . Urinary incontinence 03/19/2012  . Anemia 12/21/2011  . Knee pain, bilateral 07/23/2011  . Baker's cyst of knee 05/22/2011  . Eosinophilic esophagitis 51/76/1607  . Anxiety and depression 04/28/2011  . 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 Constipation, chronic 03/15/2011  . Mixed hyperlipidemia 10/17/2010  . CAROTID ARTERY STENOSIS 01/13/2010  . GERD 09/29/2009  . PERSONAL HX BREAST CANCER 09/29/2009    Past Surgical History:  Procedure Laterality Date  . ANTERIOR CRUCIATE LIGAMENT REPAIR  08, 09, 10  . AUGMENTATION MAMMAPLASTY Right 05/27/2007  . BREAST BIOPSY Right 01/02/2007   wire loc  . BREAST BIOPSY  12/26/2006  . BREAST LUMPECTOMY Right 2001  . BREAST RECONSTRUCTION  2008, 2009, 2010  . BREAST REDUCTION WITH MASTOPEXY Left 05/30/2017   Procedure: LEFT BREAST REDUCTION FOR SYMTERY WITH MASTOPEXY;  Surgeon: Wallace Going, DO;  Location: [facility];  Service: Plastics;  Laterality: Left;  . CHOLECYSTECTOMY  2010  . COLONOSCOPY  09/05/10   cecal avm's  . DENTAL SURGERY  05/2016   4 dental implants by Dr. Loyal Gambler.  [name] ERCP  2010    CBD stone extraction   . ESOPHAGOGASTRODUODENOSCOPY  01/08/2012   Procedure: ESOPHAGOGASTRODUODENOSCOPY (EGD);  Surgeon: Gatha Mayer, MD;  Location: [facility];  Service: Endoscopy;  Laterality: N/A;  . ESOPHAGOGASTRODUODENOSCOPY (EGD) WITH ESOPHAGEAL DILATION  2010, 2012  . LAPAROSCOPIC APPENDECTOMY N/A 08/04/2016   Procedure: APPENDECTOMY LAPAROSCOPIC;  Surgeon: Michael Boston, MD;  Location: [facility];  Service: General;  Laterality: N/A;  . LIPOSUCTION WITH LIPOFILLING Left 11/21/2017    Procedure: LIPOSUCTION FROM ABDOMEN WITH LIPOFILLING TO LEFT BREAST;  Surgeon: Wallace Going, DO;  Location: [facility];  Service: Plastics;  Laterality: Left;  [name] MASTECTOMY MODIFIED RADICAL Right 05/27/2007   , Mastectomy modified radical (08), breast reconstruction, CA lesions excised lateral abd wall 2010  . MASTOPEXY Left 11/21/2017   Procedure: LEFT BREAST REVISION MASTOPEXY FOR SYMMETRY;  Surgeon: Wallace Going, DO;  Location: [facility];  Service: Plastics;  Laterality: Left;  . REDUCTION MAMMAPLASTY Left   . SAVORY DILATION  01/08/2012   Procedure: SAVORY DILATION;  Surgeon: Gatha Mayer, MD;  Location: [facility];  Service: Endoscopy;  Laterality: N/A;  need xray    OB History   None      Home Medications    Prior to Admission medications   Medication Sig Start Date End Date Taking? Authorizing Provider  acetaminophen (TYLENOL) 500 MG tablet Take  500-1,000 mg by mouth every 6 (six) hours as needed (for pain.).    [provider]  albuterol (PROVENTIL HFA;VENTOLIN HFA) 108 (90 Base) MCG/ACT inhaler Inhale 1-2 puffs into the lungs every 6 (six) hours as needed for wheezing or shortness of breath. 04/26/18   Noe Gens, PA-C  AMBULATORY NON FORMULARY MEDICATION Medication Name: Budesonide 2 mg/10 ml Please take 1 mg by mouth two times a day 04/04/18   Gatha Mayer, MD  amitriptyline (ELAVIL) 150 MG tablet Take 1 tablet (150 mg total) by mouth at bedtime. 11/21/17   [name], MD  aspirin EC 81 MG tablet Take 81 mg by mouth daily.    [provider]  atorvastatin (LIPITOR) 20 MG tablet TAKE 1 TABLET(20 MG) BY MOUTH TWICE DAILY Patient taking differently: TAKE 2 TABLET(20 MG) BY MOUTH once DAILY 02/19/18   [name], MD  Cholecalciferol (VITAMIN D3) 1000 UNITS CAPS Take 2,000 Units by mouth daily.     [provider]  Cyanocobalamin (VITAMIN B 12 PO) Take 1 tablet by mouth daily.    [provider]  diltiazem (CARDIZEM) 30 MG tablet Take 1 tablet (30 mg total) by mouth daily. take 1 tablet by mouth q am FOR PALPITATIONS 08/12/17   Aline August, MD  ferrous sulfate 325 (65 FE) MG tablet Take 325 mg by mouth daily with breakfast.    [provider]  LORazepam (ATIVAN) 1 MG tablet Take 1 tablet (1 mg total) by mouth every 8 (eight) hours as needed for anxiety. 12/23/17   [name], MD  MAGNESIUM PO Take by mouth.    [provider]  pantoprazole (PROTONIX) 40 MG tablet TAKE 1 TABLET(40 MG) BY MOUTH DAILY 11/04/17   [name], MD  Spacer/Aero-Holding Chambers (AEROCHAMBER PLUS WITH MASK) inhaler Use as instructed 04/26/18   Noe Gens, PA-C  triamcinolone cream (KENALOG) 0.1 % Apply topically 2 (two) times daily. Apply to affected area 02/10/18   [name], MD    Family History Family History  Problem Relation Age of Onset  . Heart disease Father   . Lung cancer Father        smoker  . Cirrhosis Sister        Primary Biliary  . Stroke Maternal Grandmother   . Alcohol abuse Maternal Grandfather   . Heart disease Paternal Grandfather   . Anxiety disorder Sister   . Osteoporosis Sister   . Arthritis Sister        Rheumatoid  . Osteoporosis Sister   . Skin cancer Sister        multiple skin cancers, over 48 excisions.  . Other Mother        tic [place]  . Breast cancer Maternal Aunt        dx in her 70s  . Breast cancer Paternal Aunt        dx in her 89s  . Pancreatic cancer Maternal Uncle        dx in his 109s; smoker  . Breast cancer Maternal Aunt        dx in her 60s  . Breast cancer Maternal Aunt        possible breast cancer dx and died in her 62s  . Ovarian cancer Maternal Aunt 29  . Pancreatic cancer Maternal Aunt        dx in her 46s  . Stomach cancer Maternal Uncle   . Colon cancer Maternal Uncle   .  Lung cancer Paternal Aunt   . Breast cancer Cousin        paternal first cousin  . Breast cancer Cousin         maternal first cousin  . Anesthesia problems Neg Hx   . Hypotension Neg Hx   . Malignant hyperthermia Neg Hx   . Pseudochol deficiency Neg Hx     Social History Social History   Tobacco Use  . Smoking status: Former Smoker    Packs/day: 1.00    Years: 50.00    Pack years: 50.00    Types: Cigarettes    Last attempt to quit: 05/22/2007    Years since quitting: 10.9  . Smokeless tobacco: Never Used  Substance Use Topics  . Alcohol use: Yes    Comment: social  . Drug use: No     Allergies   Codeine; Morphine; Peanut-containing drug products; Sorbitol; Tomato; Zofran; Advil [ibuprofen]; Diphenhydramine hcl; and Oxycodone   Review of Systems Review of Systems  Constitutional: Positive for fatigue. Negative for chills and fever.  HENT: Negative for congestion, ear pain, sore throat, trouble swallowing and voice change.   Respiratory: Positive for cough ( minimal), chest tightness and shortness of breath.   Cardiovascular: Negative for chest pain and palpitations.  Gastrointestinal: Negative for abdominal pain, diarrhea, nausea and vomiting.  Musculoskeletal: Negative for arthralgias, back pain and myalgias.  Skin: Negative for rash.  Neurological: Negative for dizziness, light-headedness and headaches.     Physical Exam Triage Vital Signs ED Triage Vitals [04/26/18 1407]  Enc Vitals Group     BP 98/66     Pulse Rate 95     Resp      Temp 98.5 F (36.9 C)     Temp Source Oral     SpO2 97 %     Weight 122 lb 8 oz (55.6 kg)     Height 5' 5.5" (1.664 m)     Head Circumference      Peak Flow      Pain Score 0     Pain Loc      Pain Edu?      Excl. in [place]?    No data found.  Updated Vital Signs BP 98/66 (BP Location: Left Arm)   Pulse 95   Temp 98.5 F (36.9 C) (Oral)   Ht 5' 5.5" (1.664 m)   Wt 122 lb 8 oz (55.6 kg)   SpO2 97%   BMI 20.08 kg/m   Visual Acuity Right Eye Distance:   Left Eye Distance:   Bilateral Distance:    Right Eye Near:   Left  Eye Near:    Bilateral Near:     Physical Exam  Constitutional: She is oriented to person, place, and time. She appears well-developed and well-nourished.  Non-toxic appearance. She does not appear ill. No distress.  HENT:  Head: Normocephalic and atraumatic.  Right Ear: Tympanic membrane normal.  Left Ear: Tympanic membrane normal.  Nose: Nose normal. Right sinus exhibits no maxillary sinus tenderness and no frontal sinus tenderness. Left sinus exhibits no maxillary sinus tenderness and no frontal sinus tenderness.  Mouth/Throat: Uvula is midline, oropharynx is clear and moist and mucous membranes are normal.  Eyes: EOM are normal.  Neck: Normal range of motion.  Cardiovascular: Normal rate and regular rhythm.  Pulmonary/Chest: Effort normal. No accessory muscle usage or stridor. No respiratory distress. She has no decreased breath sounds. She has no wheezes. She has no rhonchi. She has no rales.  Musculoskeletal: Normal range of motion.  Lymphadenopathy:    She has cervical adenopathy (Left anterior).  Neurological: She is alert and oriented to person, place, and time.  Skin: Skin is warm and dry.  Psychiatric: She has a normal mood and affect. Her behavior is normal.  Nursing note and vitals reviewed.    UC Treatments / Results  Labs (all labs ordered are listed, but only abnormal results are displayed) Labs Reviewed - No data to display  EKG None  Radiology Dg Chest 2 View  Result Date: 04/26/2018 CLINICAL DATA:  77 year old female with a history of weakness EXAM: CHEST - 2 VIEW COMPARISON:  08/11/2017, 05/10/2016 FINDINGS: Cardiomediastinal silhouette unchanged in size and contour. Asymmetric density of the chest related to the prior right breast reconstruction. No pleural effusion or pneumothorax.  No confluent airspace disease. Surgical changes of the right axilla. No displaced fracture IMPRESSION: No radiographic evidence of acute cardiopulmonary disease Electronically  Signed   By: Corrie Mckusick D.O.   On: 04/26/2018 14:34    Procedures Procedures (including critical care time)  Medications Ordered in UC Medications - No data to display  Initial Impression / Assessment and Plan / UC Course  I have reviewed the triage vital signs and the nursing notes.  Pertinent labs & imaging results that were available during my care of the patient were reviewed by me and considered in my medical decision making (see chart for details).     O2 Sat 97% on RA Pt appears well, no respiratory distress. Lungs: CTAB Discussed imaging with pt. No evidence of acute bronchitis or pneumonia.  No mention of COPD on CXR. Pt may try trial of albuterol inhaler while waiting for f/u later this month.  Final Clinical Impressions(s) / UC Diagnoses   Final diagnoses:  SOB (shortness of breath)  Left cervical lymphadenopathy     Discharge Instructions      Please try the inhaler you were prescribed today to see if that helps with your shortness of breath. If your symptoms are not improving, please call your family doctor on Monday for recheck of your symptoms.   Please call 911 or go to the hospital if your chest tightness or shortness of breath is worsening despite the use of the inhaler or if new concerning symptoms develop.    ED Prescriptions    Medication Sig Dispense Auth. Provider   albuterol (PROVENTIL HFA;VENTOLIN HFA) 108 (90 Base) MCG/ACT inhaler Inhale 1-2 puffs into the lungs every 6 (six) hours as needed for wheezing or shortness of breath. 1 Inhaler Noe Gens, PA-C   Spacer/Aero-Holding Chambers (AEROCHAMBER PLUS WITH MASK) inhaler Use as instructed 1 each Noe Gens, PA-C     Controlled Substance Prescriptions [place] Controlled Substance Registry consulted? Not Applicable   Tyrell Antonio 04/26/18 1609

## 2018-04-26 NOTE — ED Triage Notes (Signed)
Patient complaining of SOB, weak feeling since Thursday, swollen lymph node on left side of neck, no fever.

## 2018-04-27 ENCOUNTER — Encounter: Payer: Self-pay | Admitting: Family Medicine

## 2018-04-28 ENCOUNTER — Other Ambulatory Visit: Payer: Self-pay | Admitting: Family Medicine

## 2018-04-28 ENCOUNTER — Encounter: Payer: Self-pay | Admitting: Family Medicine

## 2018-04-28 MED ORDER — PANTOPRAZOLE SODIUM 40 MG PO TBEC: 40.0000 mg | DELAYED_RELEASE_TABLET | Freq: Every day | ORAL | 1 refills | Status: DC

## 2018-04-29 ENCOUNTER — Ambulatory Visit (INDEPENDENT_AMBULATORY_CARE_PROVIDER_SITE_OTHER): Payer: PPO | Admitting: Family Medicine

## 2018-04-29 ENCOUNTER — Encounter: Payer: Self-pay | Admitting: Family Medicine

## 2018-04-29 DIAGNOSIS — T7840XA Allergy, unspecified, initial encounter: Secondary | ICD-10-CM

## 2018-04-29 DIAGNOSIS — F419 Anxiety disorder, unspecified: Secondary | ICD-10-CM | POA: Diagnosis not present

## 2018-04-29 DIAGNOSIS — R002 Palpitations: Secondary | ICD-10-CM | POA: Diagnosis not present

## 2018-04-29 DIAGNOSIS — F329 Major depressive disorder, single episode, unspecified: Secondary | ICD-10-CM

## 2018-04-29 DIAGNOSIS — H353130 Nonexudative age-related macular degeneration, bilateral, stage unspecified: Secondary | ICD-10-CM

## 2018-04-29 DIAGNOSIS — J45909 Unspecified asthma, uncomplicated: Secondary | ICD-10-CM

## 2018-04-29 MED ORDER — MONTELUKAST SODIUM 10 MG PO TABS: 10.0000 mg | ORAL_TABLET | Freq: Every evening | ORAL | 3 refills | Status: DC | PRN

## 2018-04-29 MED ORDER — ALBUTEROL SULFATE HFA 108 (90 BASE) MCG/ACT IN AERS: 1.0000 | INHALATION_SPRAY | Freq: Four times a day (QID) | RESPIRATORY_TRACT | 2 refills | Status: DC | PRN

## 2018-04-29 MED ORDER — FLUTICASONE PROPIONATE 50 MCG/ACT NA SUSP: 2.0000 | Freq: Every day | NASAL | 6 refills | Status: DC

## 2018-04-29 MED ORDER — LORATADINE 10 MG PO TABS: 10.0000 mg | ORAL_TABLET | Freq: Every day | ORAL | 11 refills | Status: DC

## 2018-04-29 NOTE — Progress Notes (Signed)
Subjective:  I acted as a Education administrator for Dr. Charlett Blake. [place], [place]  Patient ID: Anne [name], female    DOB: [birth year], 77 y.o.   MRN: [id number]  No chief complaint on file.   HPI  Patient is in today for an acute visit for a chest xray review. Which was normal. She continues to have episodes of feeling sob especially while working outside and after she is done. No recent fevers or chills. She notes Albuterol helps when she needs. It she is also having intermittent episodes of palpitations at times as well. No chest pain or syncope. She has been diagnosed with macular degeneration and is following with cardiology.   Patient Care Team: [name], MD as PCP - General (Family Medicine) Paralee Cancel, MD as Consulting Physician (Orthopedic Surgery) Fay Records, MD as Consulting Physician (Cardiology) Gatha Mayer, MD as Consulting Physician (Gastroenterology) Clent Jacks, MD as Consulting Physician (Ophthalmology) Roney Jaffe, DDS (Oral Surgery) Everlene Other (Dentistry) [place], Jennefer Bravo, MD as Referring Physician (Dermatology) Dillingham, Loel Lofty, DO as Attending Physician (Plastic Surgery) Truitt Merle, MD as Consulting Physician (Hematology)   Past Medical History:  Diagnosis Date  . Abdominal pain 08/11/2017  . ALKALINE PHOSPHATASE, ELEVATED 03/15/2009  . Allergic state 06/10/2012  . Anemia   . Anxiety   . Anxiety and depression 04/28/2011  . Arthritis   . Atypical chest pain 11/30/2011  . 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 Baker's cyst of knee 05/22/2011  . 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 Carotid artery disease ([place])    a. Carotid duplex 03/2014: stable 1-39% BICA, f/u due 03/2016.  [name] Chronic alcoholism in remission ([place]) 03/29/2011   Did not tolerate Klonopin, caused some confusion and bad dreams.    [name] COPD (chronic obstructive pulmonary disease) ([place])   . Dermatitis 11/23/2012  . Dysphagia   . EE  (eosinophilic esophagitis)   . Elevated sed rate 08/02/2013  . Esophageal ring   . ESOPHAGEAL STRICTURE 03/29/2009  . Fall 11/23/2012  . Family history of breast cancer   . Family history of colon cancer   . Family history of ovarian cancer   . Family history of pancreatic cancer   . Folliculitis of nose 0/86/7619  . GERD (gastroesophageal reflux disease) 09/29/2009   improved s/p cholecystectomy and esophagus dilatation  . Hematuria   . History of chicken pox   . History of measles   . History of shingles    2 episodes  . Hx of echocardiogram    a. Echo 01/2013: mild LVH, EF 55-60%, normal wall motion, Gr 1 diast dysfn  . Hyperlipidemia   . Knee pain, bilateral 07/23/2011  . Medicare annual wellness visit, subsequent 06/19/2015  . Mixed hyperlipidemia 10/17/2010   Qualifier: Diagnosis of  By: Mack Guise    . Orthostasis   . Osteopenia 03/14/2011  . Personal history of chemotherapy 2001  . Personal history of radiation therapy 2001   rt breast  . 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 Pneumonia 2-10   pleurisy  . PVC's (premature ventricular contractions)    a. Event monitor 01/2013: NSR, extensive PVCs.  . Radial neck fracture 10/2011   minimally displaced  . Sinusitis, acute 12/04/2015  . Trauma 10/18/2011  . URI (upper respiratory infection) 09/02/2012  . Urinary frequency 12/12/2014  . Urinary incontinence 03/19/2012  . Vaginitis 05/22/2011    Past Surgical History:  Procedure  Laterality Date  . ANTERIOR CRUCIATE LIGAMENT REPAIR  08, 09, 10  . AUGMENTATION MAMMAPLASTY Right 05/27/2007  . BREAST BIOPSY Right 01/02/2007   wire loc  . BREAST BIOPSY  12/26/2006  . BREAST LUMPECTOMY Right 2001  . BREAST RECONSTRUCTION  2008, 2009, 2010  . BREAST REDUCTION WITH MASTOPEXY Left 05/30/2017   Procedure: LEFT BREAST REDUCTION FOR SYMTERY WITH MASTOPEXY;  Surgeon: Wallace Going, DO;  Location: [facility];  Service: Plastics;  Laterality: Left;  .  CHOLECYSTECTOMY  2010  . COLONOSCOPY  09/05/10   cecal avm's  . DENTAL SURGERY  05/2016   4 dental implants by Dr. Loyal Gambler.  [name] ERCP  2010    CBD stone extraction   . ESOPHAGOGASTRODUODENOSCOPY  01/08/2012   Procedure: ESOPHAGOGASTRODUODENOSCOPY (EGD);  Surgeon: Gatha Mayer, MD;  Location: [facility];  Service: Endoscopy;  Laterality: N/A;  . ESOPHAGOGASTRODUODENOSCOPY (EGD) WITH ESOPHAGEAL DILATION  2010, 2012  . LAPAROSCOPIC APPENDECTOMY N/A 08/04/2016   Procedure: APPENDECTOMY LAPAROSCOPIC;  Surgeon: Michael Boston, MD;  Location: [facility];  Service: General;  Laterality: N/A;  . LIPOSUCTION WITH LIPOFILLING Left 11/21/2017   Procedure: LIPOSUCTION FROM ABDOMEN WITH LIPOFILLING TO LEFT BREAST;  Surgeon: Wallace Going, DO;  Location: [facility];  Service: Plastics;  Laterality: Left;  [name] MASTECTOMY MODIFIED RADICAL Right 05/27/2007   , Mastectomy modified radical (08), breast reconstruction, CA lesions excised lateral abd wall 2010  . MASTOPEXY Left 11/21/2017   Procedure: LEFT BREAST REVISION MASTOPEXY FOR SYMMETRY;  Surgeon: Wallace Going, DO;  Location: [facility];  Service: Plastics;  Laterality: Left;  . REDUCTION MAMMAPLASTY Left   . SAVORY DILATION  01/08/2012   Procedure: SAVORY DILATION;  Surgeon: Gatha Mayer, MD;  Location: [facility];  Service: Endoscopy;  Laterality: N/A;  need xray    Family History  Problem Relation Age of Onset  . Heart disease Father   . Lung cancer Father        smoker  . Cirrhosis Sister        Primary Biliary  . Stroke Maternal Grandmother   . Alcohol abuse Maternal Grandfather   . Heart disease Paternal Grandfather   . Anxiety disorder Sister   . Osteoporosis Sister   . Arthritis Sister        Rheumatoid  . Osteoporosis Sister   . Skin cancer Sister        multiple skin cancers, over 25 excisions.  . Other Mother        tic [place]  . Breast cancer Maternal Aunt        dx in her 68s  .  Breast cancer Paternal Aunt        dx in her 70s  . Pancreatic cancer Maternal Uncle        dx in his 38s; smoker  . Breast cancer Maternal Aunt        dx in her 96s  . Breast cancer Maternal Aunt        possible breast cancer dx and died in her 57s  . Ovarian cancer Maternal Aunt 29  . Pancreatic cancer Maternal Aunt        dx in her 22s  . Stomach cancer Maternal Uncle   . Colon cancer Maternal Uncle   . Lung cancer Paternal Aunt   . Breast cancer Cousin        paternal first cousin  . Breast cancer Cousin  maternal first cousin  . Anesthesia problems Neg Hx   . Hypotension Neg Hx   . Malignant hyperthermia Neg Hx   . Pseudochol deficiency Neg Hx     Social History   Socioeconomic History  . Marital status: Married    Spouse name: Sam  . Number of children: 3  . Years of education: Not on file  . Highest education level: Not on file  Occupational History  . Occupation: Social worker  Social Needs  . Financial resource strain: Not on file  . Food insecurity:    Worry: Not on file    Inability: Not on file  . Transportation needs:    Medical: Not on file    Non-medical: Not on file  Tobacco Use  . Smoking status: Former Smoker    Packs/day: 1.00    Years: 50.00    Pack years: 50.00    Types: Cigarettes    Last attempt to quit: 05/22/2007    Years since quitting: 10.9  . Smokeless tobacco: Never Used  Substance and Sexual Activity  . Alcohol use: Yes    Comment: social  . Drug use: No  . Sexual activity: Yes    Partners: Male    Comment: lives with husband,   Lifestyle  . Physical activity:    Days per week: Not on file    Minutes per session: Not on file  . Stress: Not on file  Relationships  . Social connections:    Talks on phone: Not on file    Gets together: Not on file    Attends religious service: Not on file    Active member of club or organization: Not on file    Attends meetings of clubs or organizations: Not on file    Relationship  status: Not on file  . Intimate partner violence:    Fear of current or ex partner: Not on file    Emotionally abused: Not on file    Physically abused: Not on file    Forced sexual activity: Not on file  Other Topics Concern  . Not on file  Social History Narrative  . Not on file    Outpatient Medications Prior to Visit  Medication Sig Dispense Refill  . acetaminophen (TYLENOL) 500 MG tablet Take 500-1,000 mg by mouth every 6 (six) hours as needed (for pain.).    [name] AMBULATORY NON FORMULARY MEDICATION Medication Name: Budesonide 2 mg/10 ml Please take 1 mg by mouth two times a day 140 mL 1  . amitriptyline (ELAVIL) 150 MG tablet Take 1 tablet (150 mg total) by mouth at bedtime. 30 tablet 5  . aspirin EC 81 MG tablet Take 81 mg by mouth daily.    [name] atorvastatin (LIPITOR) 20 MG tablet TAKE 1 TABLET(20 MG) BY MOUTH TWICE DAILY (Patient taking differently: TAKE 2 TABLET(20 MG) BY MOUTH once DAILY) 180 tablet 3  . Cholecalciferol (VITAMIN D3) 1000 UNITS CAPS Take 2,000 Units by mouth daily.     . Cyanocobalamin (VITAMIN B 12 PO) Take 1 tablet by mouth daily.    [name] diltiazem (CARDIZEM) 30 MG tablet Take 1 tablet (30 mg total) by mouth daily. take 1 tablet by mouth q am FOR PALPITATIONS    . ferrous sulfate 325 (65 FE) MG tablet Take 325 mg by mouth daily with breakfast.    . LORazepam (ATIVAN) 1 MG tablet Take 1 tablet (1 mg total) by mouth every 8 (eight) hours as needed for anxiety. 70 tablet 5  .  MAGNESIUM PO Take by mouth.    . pantoprazole (PROTONIX) 40 MG tablet Take 1 tablet (40 mg total) by mouth daily. 90 tablet 1  . Spacer/Aero-Holding Chambers (AEROCHAMBER PLUS WITH MASK) inhaler Use as instructed 1 each 2  . triamcinolone cream (KENALOG) 0.1 % Apply topically 2 (two) times daily. Apply to affected area 45 g 1  . albuterol (PROVENTIL HFA;VENTOLIN HFA) 108 (90 Base) MCG/ACT inhaler Inhale 1-2 puffs into the lungs every 6 (six) hours as needed for wheezing or shortness of breath. 1  Inhaler 0   No facility-administered medications prior to visit.     Allergies  Allergen Reactions  . Codeine Other (See Comments)    "flu like symptoms"  . Morphine Other (See Comments)    "flu like symptoms"  . Peanut-Containing Drug Products Hives  . Sorbitol Other (See Comments)    GI Issues  . Tomato Diarrhea  . Zofran Other (See Comments)    headache  . Advil [Ibuprofen] Other (See Comments)    Irritates throat.  . Diphenhydramine Hcl Other (See Comments)    "nervous and upset"  . Oxycodone Anxiety    Confusion with inability to think clearly    Review of Systems  Constitutional: Positive for malaise/fatigue. Negative for fever.  HENT: Negative for congestion.   Eyes: Negative for blurred vision.  Respiratory: Positive for shortness of breath.   Cardiovascular: Positive for palpitations. Negative for chest pain and leg swelling.  Gastrointestinal: Negative for abdominal pain, blood in stool and nausea.  Genitourinary: Negative for dysuria and frequency.  Musculoskeletal: Negative for falls.  Skin: Negative for rash.  Neurological: Negative for dizziness, loss of consciousness and headaches.  Endo/Heme/Allergies: Negative for environmental allergies.  Psychiatric/Behavioral: Negative for depression. The patient is not nervous/anxious.        Objective:    Physical Exam  Constitutional: She is oriented to person, place, and time. No distress.  HENT:  Head: Normocephalic and atraumatic.  Eyes: Conjunctivae are normal.  Neck: Neck supple. No thyromegaly present.  Cardiovascular: Normal rate, regular rhythm and normal heart sounds.  No murmur heard. Pulmonary/Chest: Effort normal and breath sounds normal. She has no wheezes.  Abdominal: She exhibits no distension and no mass.  Musculoskeletal: She exhibits no edema.  Lymphadenopathy:    She has no cervical adenopathy.  Neurological: She is alert and oriented to person, place, and time.  Skin: Skin is warm and  dry. No rash noted. She is not diaphoretic.  Psychiatric: Judgment normal.    BP 98/66 (BP Location: Left Arm, Patient Position: Sitting, Cuff Size: Normal)   Pulse 78   Temp 98.1 F (36.7 C) (Oral)   Resp 18   Wt 124 lb 6.4 oz (56.4 kg)   SpO2 96%   BMI 20.39 kg/m  Wt Readings from Last 3 Encounters:  04/29/18 124 lb 6.4 oz (56.4 kg)  04/26/18 122 lb 8 oz (55.6 kg)  04/03/18 125 lb 6.4 oz (56.9 kg)   BP Readings from Last 3 Encounters:  04/29/18 98/66  04/26/18 98/66  04/03/18 106/72     Immunization History  Administered Date(s) Administered  . Influenza Split 08/27/2011, 10/21/2012  . Influenza, High Dose Seasonal PF 08/01/2016, 09/19/2017  . Influenza,inj,Quad PF,6+ Mos 07/31/2013, 12/07/2014, 11/01/2015  . Tdap 04/08/2013    Health Maintenance  Topic Date Due  . PNA vac Low Risk Adult (1 of 2 - PCV13) 09/09/2007  . INFLUENZA VACCINE  06/12/2018  . COLONOSCOPY  09/05/2020  . TETANUS/TDAP  04/09/2023  .  DEXA SCAN  Completed    Lab Results  Component Value Date   WBC 8.6 04/16/2018   HGB 12.2 04/16/2018   HCT 36.6 04/16/2018   PLT 276.0 04/16/2018   GLUCOSE 105 (H) 04/16/2018   CHOL 155 08/05/2017   TRIG 145.0 08/05/2017   HDL 61.90 08/05/2017   LDLDIRECT 117.4 01/13/2013   LDLCALC 64 08/05/2017   ALT 23 04/16/2018   AST 23 04/16/2018   NA 140 04/16/2018   K 4.4 04/16/2018   CL 103 04/16/2018   CREATININE 0.68 04/16/2018   BUN 20 04/16/2018   CO2 28 04/16/2018   TSH 3.26 04/16/2018   INR 1.02 08/12/2017    Lab Results  Component Value Date   TSH 3.26 04/16/2018   Lab Results  Component Value Date   WBC 8.6 04/16/2018   HGB 12.2 04/16/2018   HCT 36.6 04/16/2018   MCV 91.3 04/16/2018   PLT 276.0 04/16/2018   Lab Results  Component Value Date   NA 140 04/16/2018   K 4.4 04/16/2018   CHLORIDE 104 09/04/2017   CO2 28 04/16/2018   GLUCOSE 105 (H) 04/16/2018   BUN 20 04/16/2018   CREATININE 0.68 04/16/2018   BILITOT 0.3 04/16/2018    ALKPHOS 100 04/16/2018   AST 23 04/16/2018   ALT 23 04/16/2018   PROT 6.8 04/16/2018   ALBUMIN 4.3 04/16/2018   CALCIUM 9.6 04/16/2018   ANIONGAP 11 09/04/2017   EGFR >60 09/04/2017   GFR 89.51 04/16/2018   Lab Results  Component Value Date   CHOL 155 08/05/2017   Lab Results  Component Value Date   HDL 61.90 08/05/2017   Lab Results  Component Value Date   LDLCALC 64 08/05/2017   Lab Results  Component Value Date   TRIG 145.0 08/05/2017   Lab Results  Component Value Date   CHOLHDL 3 08/05/2017   No results found for: HGBA1C       Assessment & Plan:   Problem List Items Addressed This Visit    Anxiety and depression    Does well with current meds. May continue Lorazepam prn      Allergy    Start daily antihistamine such as Cetirizine and daily Flonase and singulair.       Asthma    She is noting sob and difficulty with chest tightness after working in the yard. Sounds like an allergy flared asthma. Responds to albuterol, given refill.       Relevant Medications   montelukast (SINGULAIR) 10 MG tablet   albuterol (PROVENTIL HFA;VENTOLIN HFA) 108 (90 Base) MCG/ACT inhaler   Macular degeneration, dry    Wet. Following with Dr Katy Fitch and Dr Zadie Rhine and getting treatments.       Palpitations    With some SOB as well. Referred back to cardiology for further consideration. Seek care if symptoms worsen.          I am having Anne R. Helzer "Torie" start on loratadine, fluticasone, and montelukast. I am also having her maintain her Vitamin D3, Cyanocobalamin (VITAMIN B 12 PO), ferrous sulfate, aspirin EC, acetaminophen, diltiazem, amitriptyline, LORazepam, triamcinolone cream, atorvastatin, MAGNESIUM PO, AMBULATORY NON FORMULARY MEDICATION, aerochamber plus with mask, pantoprazole, and albuterol.  Meds ordered this encounter  Medications  . loratadine (CLARITIN) 10 MG tablet    Sig: Take 1 tablet (10 mg total) by mouth daily.    Dispense:  30 tablet     Refill:  11  . fluticasone (FLONASE) 50 MCG/ACT nasal spray  Sig: Place 2 sprays into both nostrils daily.    Dispense:  16 g    Refill:  6  . montelukast (SINGULAIR) 10 MG tablet    Sig: Take 1 tablet (10 mg total) by mouth at bedtime as needed.    Dispense:  30 tablet    Refill:  3  . albuterol (PROVENTIL HFA;VENTOLIN HFA) 108 (90 Base) MCG/ACT inhaler    Sig: Inhale 1-2 puffs into the lungs every 6 (six) hours as needed for wheezing or shortness of breath.    Dispense:  1 Inhaler    Refill:  2    CMA served as scribe during this visit. History, Physical and Plan performed by medical provider. Documentation and orders reviewed and attested to.  Penni Homans, MD

## 2018-04-29 NOTE — Patient Instructions (Addendum)
gatorade daily   Clean nose with nasal saline after working outside as needed Take Singulair, Claritin and Flonase daily for a month as you improve consider stopping one med at a time and see if symptoms return. I would stop Flonase first, wait a few weeks then stop Claritin and finally singulair If none of this helps the breathing then call for referral to pulmonology.   Allergic Rhinitis, Adult Allergic rhinitis is an allergic reaction that affects the mucous membrane inside the nose. It causes sneezing, a runny or stuffy nose, and the feeling of mucus going down the back of the throat (postnasal drip). Allergic rhinitis can be mild to severe. There are two types of allergic rhinitis:  Seasonal. This type is also called hay fever. It happens only during certain seasons.  Perennial. This type can happen at any time of the year.  What are the causes? This condition happens when the body's defense system (immune system) responds to certain harmless substances called allergens as though they were germs.  Seasonal allergic rhinitis is triggered by pollen, which can come from grasses, trees, and weeds. Perennial allergic rhinitis may be caused by:  House dust mites.  Pet dander.  Mold spores.  What are the signs or symptoms? Symptoms of this condition include:  Sneezing.  Runny or stuffy nose (nasal congestion).  Postnasal drip.  Itchy nose.  Tearing of the eyes.  Trouble sleeping.  Daytime sleepiness.  How is this diagnosed? This condition may be diagnosed based on:  Your medical history.  A physical exam.  Tests to check for related conditions, such as: ? Asthma. ? Pink eye. ? Ear infection. ? Upper respiratory infection.  Tests to find out which allergens trigger your symptoms. These may include skin or blood tests.  How is this treated? There is no cure for this condition, but treatment can help control symptoms. Treatment may include:  Taking medicines  that block allergy symptoms, such as antihistamines. Medicine may be given as a shot, nasal spray, or pill.  Avoiding the allergen.  Desensitization. This treatment involves getting ongoing shots until your body becomes less sensitive to the allergen. This treatment may be done if other treatments do not help.  If taking medicine and avoiding the allergen does not work, new, stronger medicines may be prescribed.  Follow these instructions at home:  Find out what you are allergic to. Common allergens include smoke, dust, and pollen.  Avoid the things you are allergic to. These are some things you can do to help avoid allergens: ? Replace carpet with wood, tile, or vinyl flooring. Carpet can trap dander and dust. ? Do not smoke. Do not allow smoking in your home. ? Change your heating and air conditioning filter at least once a month. ? During allergy season:  Keep windows closed as much as possible.  Plan outdoor activities when pollen counts are lowest. This is usually during the evening hours.  When coming indoors, change clothing and shower before sitting on furniture or bedding.  Take over-the-counter and prescription medicines only as told by your health care provider.  Keep all follow-up visits as told by your health care provider. This is important. Contact a health care provider if:  You have a fever.  You develop a persistent cough.  You make whistling sounds when you breathe (you wheeze).  Your symptoms interfere with your normal daily activities. Get help right away if:  You have shortness of breath. Summary  This condition can be managed  by taking medicines as directed and avoiding allergens.  Contact your health care provider if you develop a persistent cough or fever.  During allergy season, keep windows closed as much as possible. This information is not intended to replace advice given to you by your health care provider. Make sure you discuss any  questions you have with your health care provider. Document Released: 07/24/2001 Document Revised: 12/06/2016 Document Reviewed: 12/06/2016 Elsevier Interactive Patient Education  [employer].

## 2018-04-30 ENCOUNTER — Telehealth: Payer: Self-pay

## 2018-04-30 DIAGNOSIS — H353190 Nonexudative age-related macular degeneration, unspecified eye, stage unspecified: Secondary | ICD-10-CM | POA: Insufficient documentation

## 2018-04-30 DIAGNOSIS — R002 Palpitations: Secondary | ICD-10-CM | POA: Insufficient documentation

## 2018-04-30 DIAGNOSIS — J45909 Unspecified asthma, uncomplicated: Secondary | ICD-10-CM | POA: Insufficient documentation

## 2018-04-30 NOTE — Assessment & Plan Note (Signed)
Wet. Following with Dr Katy Fitch and Dr Zadie Rhine and getting treatments.

## 2018-04-30 NOTE — Telephone Encounter (Signed)
PA initiated via Covermymeds; KEY: [id number]. Awaiting determination.

## 2018-04-30 NOTE — Assessment & Plan Note (Signed)
Does well with current meds. May continue Lorazepam prn

## 2018-04-30 NOTE — Assessment & Plan Note (Signed)
With some SOB as well. Referred back to cardiology for further consideration. Seek care if symptoms worsen.

## 2018-04-30 NOTE — Assessment & Plan Note (Signed)
Start daily antihistamine such as Cetirizine and daily Flonase and singulair.

## 2018-04-30 NOTE — Assessment & Plan Note (Signed)
She is noting sob and difficulty with chest tightness after working in the yard. Sounds like an allergy flared asthma. Responds to albuterol, given refill.

## 2018-04-30 NOTE — Telephone Encounter (Signed)
PA Case: [id number], Status: Approved, Coverage Starts on: 04/30/2018 12:00:00 AM, Coverage Ends on: 11/11/2018 12:00:00 AM.

## 2018-05-02 ENCOUNTER — Other Ambulatory Visit: Payer: Self-pay | Admitting: Internal Medicine

## 2018-05-05 IMAGING — CT CT ABD-PELV W/ CM
2 of 5 series · 16 of 46 positions shown, 18 images · IV contrast (APPLIED)
Comparison: 08/03/2016

CLINICAL DATA: Diarrhea for 6 days.

EXAM:
CT ABDOMEN AND PELVIS WITH CONTRAST
TECHNIQUE: Multidetector CT imaging of the abdomen and pelvis was performed
using the standard protocol following bolus administration of
intravenous contrast.
CONTRAST:  100mL [id number] IOPAMIDOL (M4AEFF-[id number]) INJECTION 61%

[Series 2: axial st · axial · 0.94mm/px · z∈[-442,-36]mm · 13 of 91 slices shown, 15 images]
[im 5/91  soft-tissue]
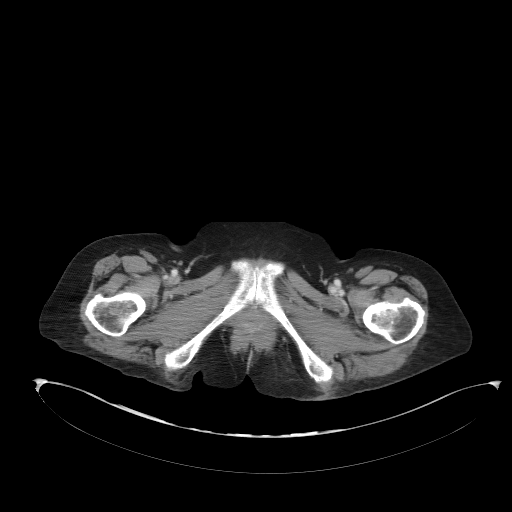
[im 5/91  bone]
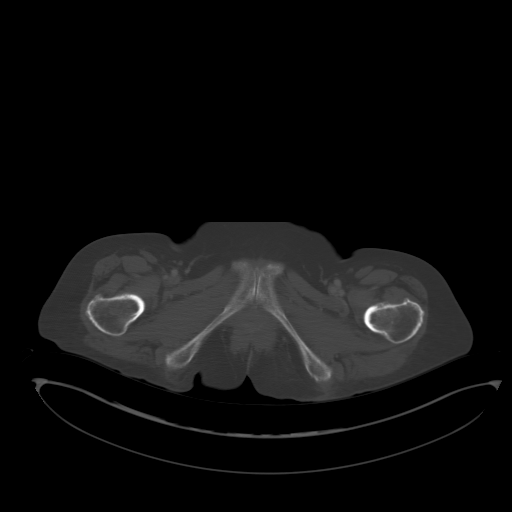
[im 15/91  soft-tissue]
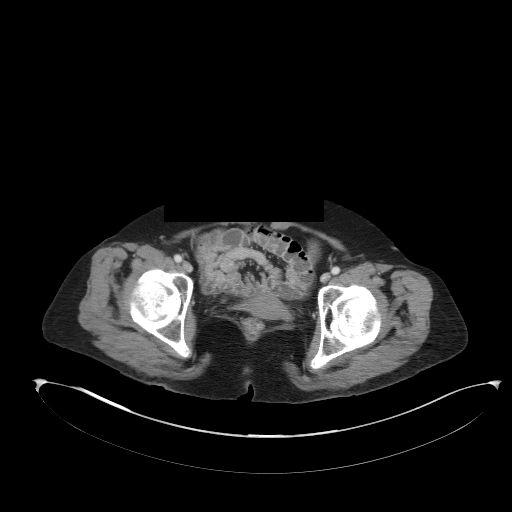
[im 19/91  soft-tissue]
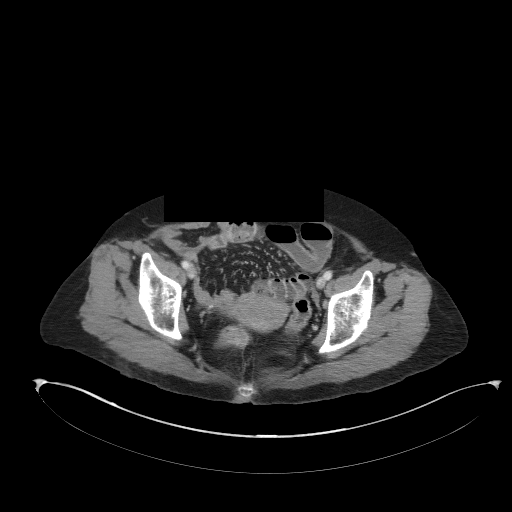
[im 24/91  soft-tissue]
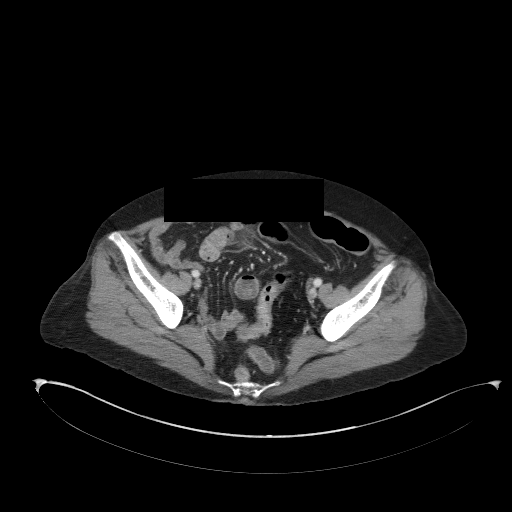
[im 34/91  soft-tissue]
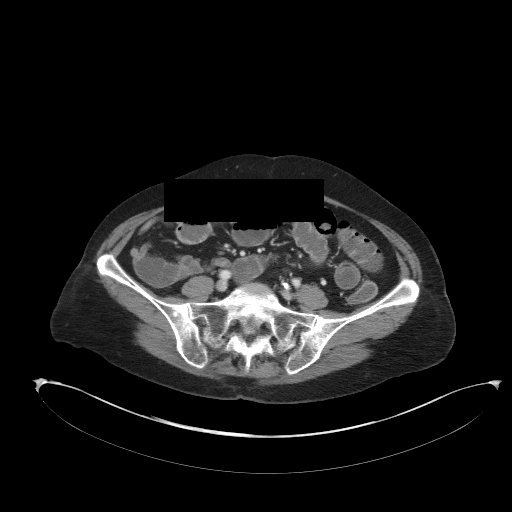
[im 38/91  soft-tissue]
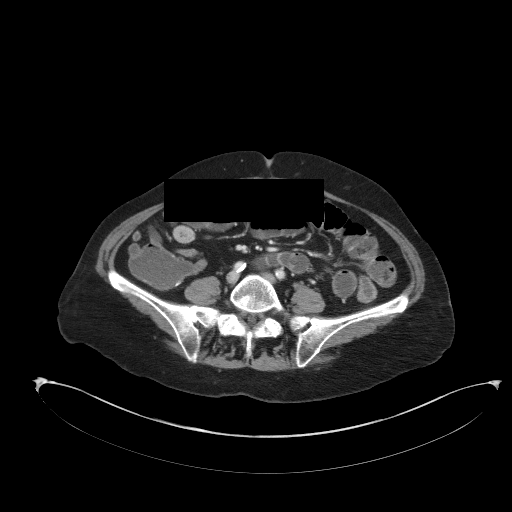
[im 48/91  soft-tissue]
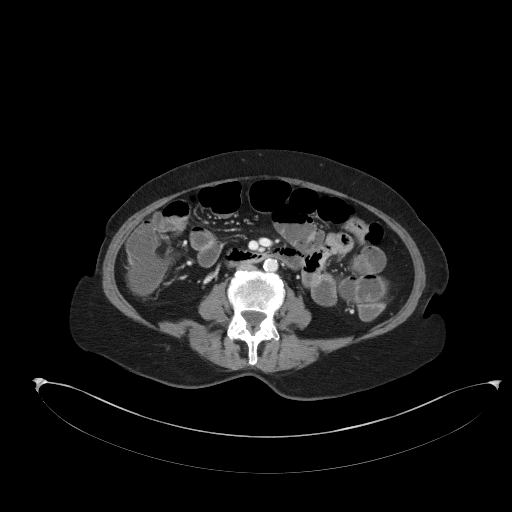
[im 53/91  soft-tissue]
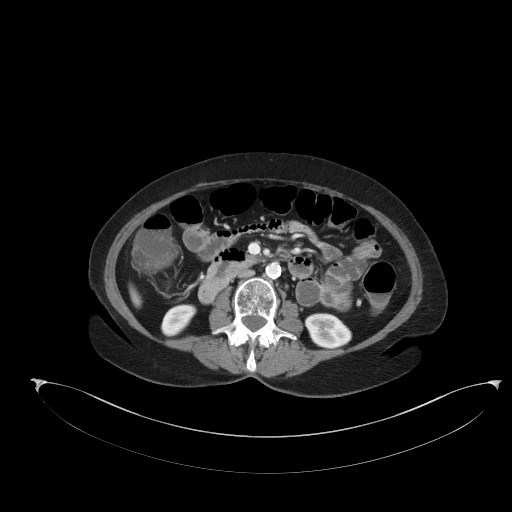
[im 57/91  soft-tissue]
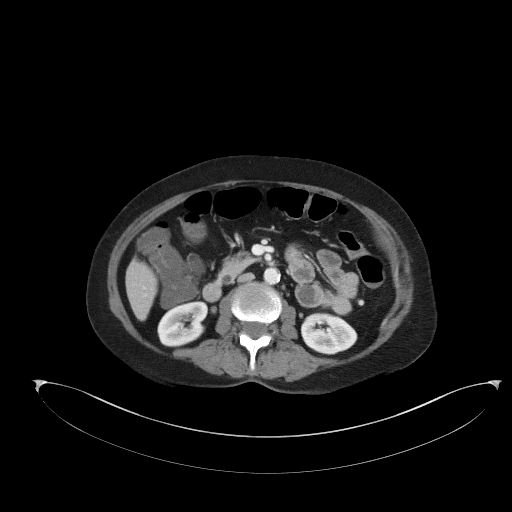
[im 57/91  bone]
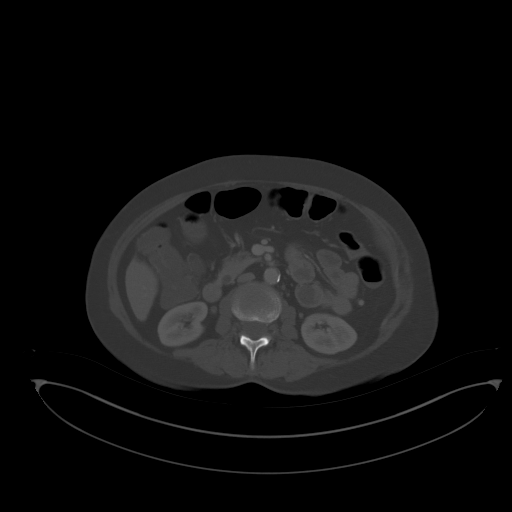
[im 67/91  soft-tissue]
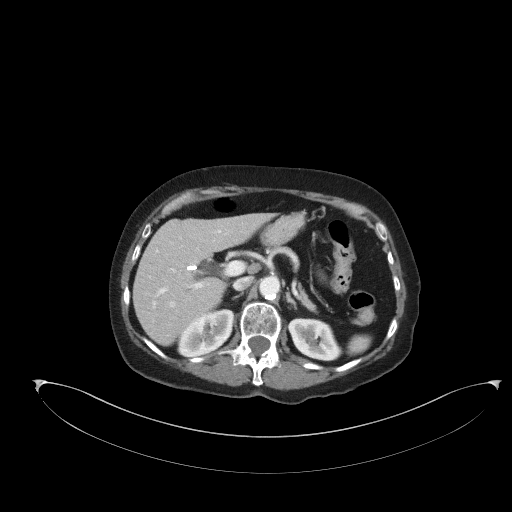
[im 72/91  soft-tissue]
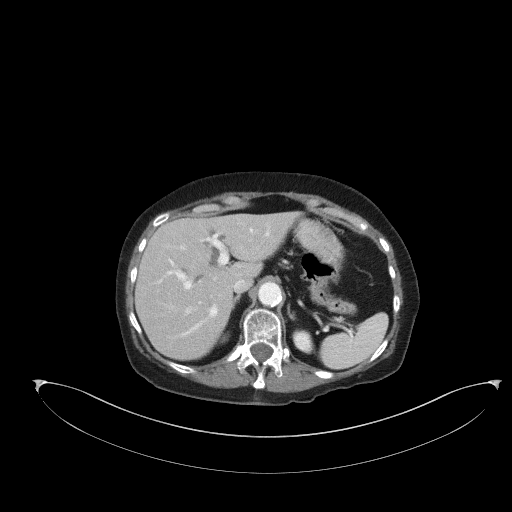
[im 76/91  soft-tissue]
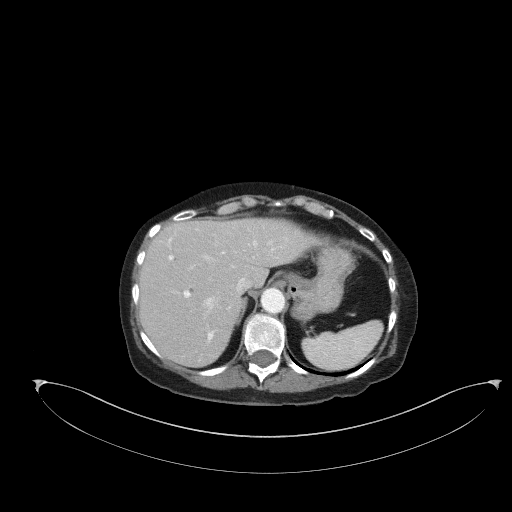
[im 86/91  soft-tissue]
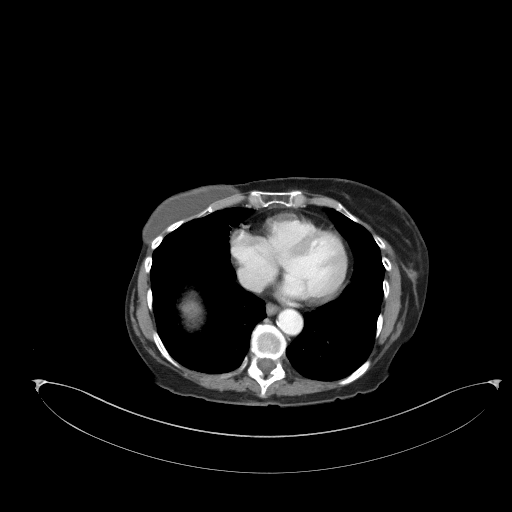

[Series 5: coronal st · coronal · 0.70mm/px · 3 of 79 slices shown]
[im 27/79  soft-tissue]
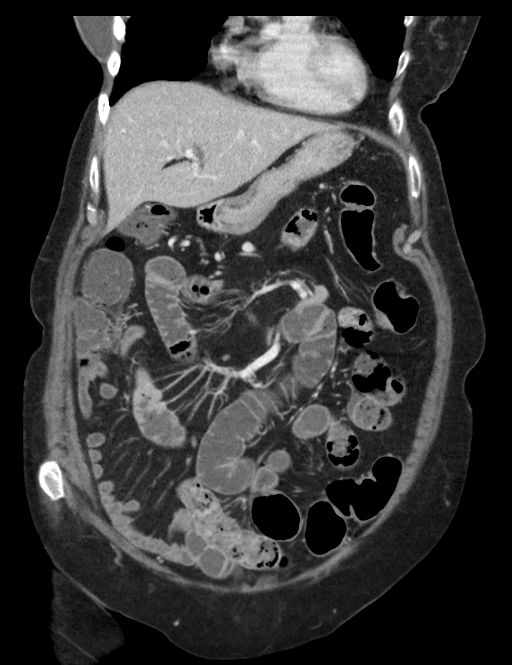
[im 35/79  soft-tissue]
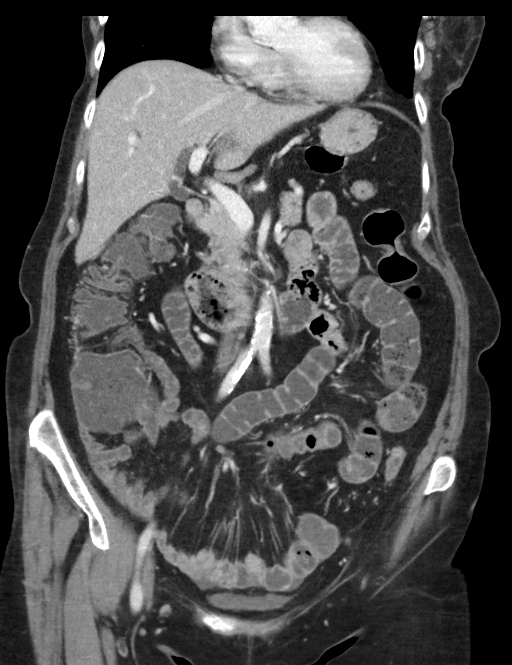
[im 44/79  soft-tissue]
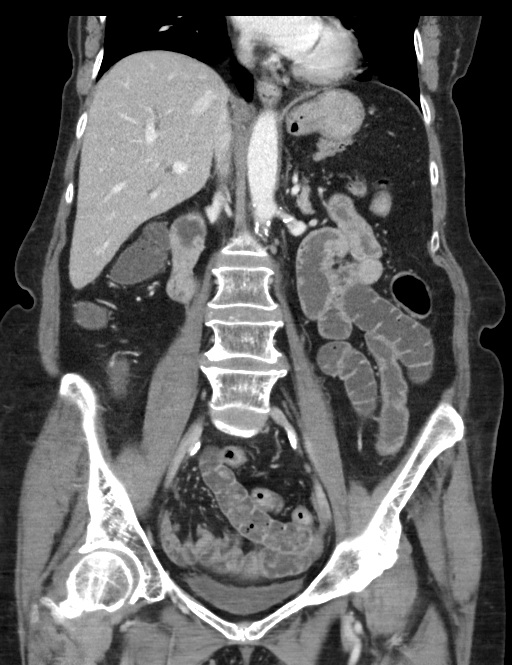

[16 of 46 positions shown; findings below may reference images not displayed]

FINDINGS: Lower chest: No acute abnormality.

Hepatobiliary: No focal liver abnormality. Previous cholecystectomy.
Mild intrahepatic biliary dilatation. Similar to previous exam.
Chronic increase caliber of the CBD which measures 9 mm, image 30 of
series 2.

Pancreas: Unremarkable. No pancreatic ductal dilatation or
surrounding inflammatory changes.

Spleen: Calcified granulomas identified within the spleen.

Adrenals/Urinary Tract: The adrenal glands are normal. Unremarkable
appearance of both kidneys. No mass or hydronephrosis. The urinary
bladder appears normal.

Stomach/Bowel: The stomach appears unremarkable. The proximal and
mid small bowel loops are increased in caliber measuring up to 3 cm.
Transition to decreased caliber distal small bowel loops noted
within the right lower quadrant of the abdomen. Unremarkable
appearance of the colon.

Vascular/Lymphatic: Aortic atherosclerosis. No aneurysm. Portacaval
node measures 8 mm. No adenopathy identified within the abdomen or
pelvis. No inguinal adenopathy.

Reproductive: Uterus and bilateral adnexa are unremarkable.

Other: No free fluid or fluid collections within the abdomen or
pelvis.

Musculoskeletal: No acute or significant osseous findings.
IMPRESSION: 1. Increase caliber of the proximal and mid small bowel loops
measuring up to 3 cm and suspicious for small bowel obstruction.
There is a transition to decreased caliber distal small bowel loops
within the right lower quadrant of the abdomen and pelvis.
2. No significant free fluid or fluid collections identified.
3. Status post cholecystectomy with chronic increase caliber of the
common bile duct and intrahepatic ducts.
4.  Aortic Atherosclerosis (RCEF3-O5M.M).

## 2018-05-06 ENCOUNTER — Encounter: Payer: Self-pay | Admitting: Internal Medicine

## 2018-05-06 ENCOUNTER — Other Ambulatory Visit: Payer: PPO

## 2018-05-06 ENCOUNTER — Encounter: Payer: Self-pay | Admitting: Pulmonary Disease

## 2018-05-06 ENCOUNTER — Ambulatory Visit: Payer: PPO | Admitting: Pulmonary Disease

## 2018-05-06 VITALS — BP 100/64 | HR 88 | Ht 65.5 in | Wt 125.0 lb

## 2018-05-06 DIAGNOSIS — R0602 Shortness of breath: Secondary | ICD-10-CM | POA: Diagnosis not present

## 2018-05-06 MED ORDER — BUDESONIDE-FORMOTEROL FUMARATE 160-4.5 MCG/ACT IN AERO: 2.0000 | INHALATION_SPRAY | Freq: Two times a day (BID) | RESPIRATORY_TRACT | 0 refills | Status: DC

## 2018-05-06 MED ORDER — BUDESONIDE-FORMOTEROL FUMARATE 160-4.5 MCG/ACT IN AERO: 2.0000 | INHALATION_SPRAY | Freq: Two times a day (BID) | RESPIRATORY_TRACT | 5 refills | Status: DC

## 2018-05-06 NOTE — Patient Instructions (Addendum)
We will schedule you for pulmonary function test Check blood allergy profile We will start you on Symbicort 160.  Continue to use the albuterol Reevaluate in 1 month.

## 2018-05-06 NOTE — Progress Notes (Signed)
Anne [name]    [id number]    [birth year]  Primary Care Physician:[name], [name], MD  Referring Physician: [name], MD [address] [place], [place] [address]  Chief complaint: Consult for dyspnea  HPI: 77 year old with history of breast cancer, emphysema, eosinophilic esophagitis, palpitations, hyperlipidemia Complains of progressive dyspnea on exertion for the past 2 years.  She does not have symptoms at rest.  Denies any cough, sputum production, wheezing.  She is on albuterol which she is using a couple of times per week.  She has significant seasonal allergies, notes episodes of increasing dyspnea, tightness after working in her yard.  She also has acid reflux, eosinophilic esophagitis and follows with Dr. [name].  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 Smoking history: 50-70-pack-year smoking history.  Quit in 2008. Travel history: No significant travel.  Outpatient Encounter Medications as of 05/06/2018  Medication Sig  . acetaminophen (TYLENOL) 500 MG tablet Take 500-1,000 mg by mouth every 6 (six) hours as needed (for pain.).  [name] albuterol (PROVENTIL HFA;VENTOLIN HFA) 108 (90 Base) MCG/ACT inhaler Inhale 1-2 puffs into the lungs every 6 (six) hours as needed for wheezing or shortness of breath.  . AMBULATORY NON FORMULARY MEDICATION Medication Name: Budesonide 2 mg/10 ml Please take 1 mg by mouth two times a day  . amitriptyline (ELAVIL) 150 MG tablet Take 1 tablet (150 mg total) by mouth at bedtime.  [name] aspirin EC 81 MG tablet Take 81 mg by mouth daily.  [name] atorvastatin (LIPITOR) 20 MG tablet TAKE 1 TABLET(20 MG) BY MOUTH TWICE DAILY (Patient taking differently: TAKE 2 TABLET(20 MG) BY MOUTH once DAILY)  . Cholecalciferol (VITAMIN D3) 1000 UNITS CAPS Take 2,000 Units by mouth  daily.   . Cyanocobalamin (VITAMIN B 12 PO) Take 1 tablet by mouth daily.  [name] diltiazem (CARDIZEM) 30 MG tablet TAKE 1 TABLET BY MOUTH ONCE DAILY FOR PALPITATIONS  . ferrous sulfate 325 (65 FE) MG tablet Take 325 mg by mouth daily with breakfast.  . fluticasone (FLONASE) 50 MCG/ACT nasal spray Place 2 sprays into both nostrils daily.  [name] loratadine (CLARITIN) 10 MG tablet Take 1 tablet (10 mg total) by mouth daily.  [name] LORazepam (ATIVAN) 1 MG tablet Take 1 tablet (1 mg total) by mouth every 8 (eight) hours as needed for anxiety.  . montelukast (SINGULAIR) 10 MG tablet Take 1 tablet (10 mg total) by mouth at bedtime as needed.  . pantoprazole (PROTONIX) 40 MG tablet Take 1 tablet (40 mg total) by mouth daily.  [name] Spacer/Aero-Holding Chambers (AEROCHAMBER PLUS WITH MASK) inhaler Use as instructed  . triamcinolone cream (KENALOG) 0.1 % Apply topically 2 (two) times daily. Apply to affected area  . [DISCONTINUED] MAGNESIUM PO Take by mouth.   No facility-administered encounter medications on file as of 05/06/2018.     Allergies as of 05/06/2018 - Review Complete 05/06/2018  Allergen Reaction Noted  . Codeine Other (See Comments) 04/02/2016  . Morphine Other (See Comments) 04/02/2016  . Peanut-containing drug products Hives 06/10/2012  . Sorbitol Other (See Comments) 07/06/2015  . Tomato Diarrhea 05/31/2014  . Zofran Other (See Comments) 07/07/2004  . Advil [ibuprofen] Other (See Comments) 05/20/2017  . Diphenhydramine hcl Other (See Comments)   . Oxycodone Anxiety 06/07/2017    Past Medical History:  Diagnosis Date  . Abdominal pain 08/11/2017  . ALKALINE PHOSPHATASE, ELEVATED 03/15/2009  . Allergic state 06/10/2012  . Anemia   . Anxiety   . Anxiety and depression 04/28/2011  . Arthritis   . Atypical chest pain 11/30/2011  . 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 Baker's cyst of knee 05/22/2011  . Cancer ([facility]) 01,  08   XRT/chemo 01-02/ lobular invasive ca  . Carotid  artery disease ([facility])    a. Carotid duplex 03/2014: stable 1-39% BICA, f/u due 03/2016.  [name] Chronic alcoholism in remission ([facility]) 03/29/2011   Did not tolerate Klonopin, caused some confusion and bad dreams.    [name] COPD (chronic obstructive pulmonary disease) ([facility])   . Dermatitis 11/23/2012  . Dysphagia   . EE (eosinophilic esophagitis)   . Elevated sed rate 08/02/2013  . Esophageal ring   . ESOPHAGEAL STRICTURE 03/29/2009  . Fall 11/23/2012  . Family history of breast cancer   . Family history of colon cancer   . Family history of ovarian cancer   . Family history of pancreatic cancer   . Folliculitis of nose 0/08/2724  . GERD (gastroesophageal reflux disease) 09/29/2009   improved s/p cholecystectomy and esophagus dilatation  . Hematuria   . History of chicken pox   . History of measles   . History of shingles    2 episodes  . Hx of echocardiogram    a. Echo 01/2013: mild LVH, EF 55-60%, normal wall motion, Gr 1 diast dysfn  . Hyperlipidemia   . Knee pain, bilateral 07/23/2011  . Medicare annual wellness visit, subsequent 06/19/2015  . Mixed hyperlipidemia 10/17/2010   Qualifier: Diagnosis of  By: Mack Guise    . Orthostasis   . Osteopenia 03/14/2011  . Personal history of chemotherapy 2001  . Personal history of radiation therapy 2001   rt breast  . 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 Pneumonia 2-10   pleurisy  . PVC's (premature ventricular contractions)    a. Event monitor 01/2013: NSR, extensive PVCs.  . Radial neck fracture 10/2011   minimally displaced  . Sinusitis, acute 12/04/2015  . Trauma 10/18/2011  . URI (upper respiratory infection) 09/02/2012  . Urinary frequency 12/12/2014  . Urinary incontinence 03/19/2012  . Vaginitis 05/22/2011    Past Surgical History:  Procedure Laterality Date  . ANTERIOR CRUCIATE LIGAMENT REPAIR  08, 09, 10  . AUGMENTATION MAMMAPLASTY Right 05/27/2007  . BREAST BIOPSY Right 01/02/2007   wire loc  . BREAST BIOPSY  12/26/2006  . BREAST  LUMPECTOMY Right 2001  . BREAST RECONSTRUCTION  2008, 2009, 2010  . BREAST REDUCTION WITH MASTOPEXY Left 05/30/2017   Procedure: LEFT BREAST REDUCTION FOR SYMTERY WITH MASTOPEXY;  Surgeon: Wallace Going, DO;  Location: [facility];  Service: Plastics;  Laterality: Left;  . CHOLECYSTECTOMY  2010  . COLONOSCOPY  09/05/10   cecal avm's  . DENTAL SURGERY  05/2016   4 dental implants by Dr. Loyal Gambler.  [name] ERCP  2010    CBD stone extraction   . ESOPHAGOGASTRODUODENOSCOPY  01/08/2012   Procedure: ESOPHAGOGASTRODUODENOSCOPY (EGD);  Surgeon: Gatha Mayer, MD;  Location: [facility];  Service: Endoscopy;  Laterality: N/A;  . ESOPHAGOGASTRODUODENOSCOPY (EGD) WITH ESOPHAGEAL DILATION  2010, 2012  . LAPAROSCOPIC APPENDECTOMY N/A 08/04/2016   Procedure: APPENDECTOMY LAPAROSCOPIC;  Surgeon: Michael Boston, MD;  Location: [facility];  Service: General;  Laterality: N/A;  . LIPOSUCTION WITH LIPOFILLING Left 11/21/2017   Procedure: LIPOSUCTION FROM ABDOMEN  WITH LIPOFILLING TO LEFT BREAST;  Surgeon: Wallace Going, DO;  Location: [facility];  Service: Plastics;  Laterality: Left;  [name] MASTECTOMY MODIFIED RADICAL Right 05/27/2007   , Mastectomy modified radical (08), breast reconstruction, CA lesions excised lateral abd wall 2010  . MASTOPEXY Left 11/21/2017   Procedure: LEFT BREAST REVISION MASTOPEXY FOR SYMMETRY;  Surgeon: Wallace Going, DO;  Location: [facility];  Service: Plastics;  Laterality: Left;  . REDUCTION MAMMAPLASTY Left   . SAVORY DILATION  01/08/2012   Procedure: SAVORY DILATION;  Surgeon: Gatha Mayer, MD;  Location: [facility];  Service: Endoscopy;  Laterality: N/A;  need xray    Family History  Problem Relation Age of Onset  . Heart disease Father   . Lung cancer Father        smoker  . Cirrhosis Sister        Primary Biliary  . Stroke Maternal Grandmother   . Alcohol abuse Maternal Grandfather   . Heart disease Paternal  Grandfather   . Anxiety disorder Sister   . Osteoporosis Sister   . Arthritis Sister        Rheumatoid  . Osteoporosis Sister   . Skin cancer Sister        multiple skin cancers, over 73 excisions.  . Other Mother        tic [place]  . Breast cancer Maternal Aunt        dx in her 77s  . Breast cancer Paternal Aunt        dx in her 29s  . Pancreatic cancer Maternal Uncle        dx in his 31s; smoker  . Breast cancer Maternal Aunt        dx in her 40s  . Breast cancer Maternal Aunt        possible breast cancer dx and died in her 105s  . Ovarian cancer Maternal Aunt 29  . Pancreatic cancer Maternal Aunt        dx in her 37s  . Stomach cancer Maternal Uncle   . Colon cancer Maternal Uncle   . Lung cancer Paternal Aunt   . Breast cancer Cousin        paternal first cousin  . Breast cancer Cousin        maternal first cousin  . Anesthesia problems Neg Hx   . Hypotension Neg Hx   . Malignant hyperthermia Neg Hx   . Pseudochol deficiency Neg Hx     Social History   Socioeconomic History  . Marital status: Married    Spouse name: Sam  . Number of children: 3  . Years of education: Not on file  . Highest education level: Not on file  Occupational History  . Occupation: Social worker  Social Needs  . Financial resource strain: Not on file  . Food insecurity:    Worry: Not on file    Inability: Not on file  . Transportation needs:    Medical: Not on file    Non-medical: Not on file  Tobacco Use  . Smoking status: Former Smoker    Packs/day: 1.00    Years: 50.00    Pack years: 50.00    Types: Cigarettes    Last attempt to quit: 05/22/2007    Years since quitting: 10.9  . Smokeless tobacco: Never Used  Substance and Sexual Activity  . Alcohol use: Not Currently  . Drug use: No  . Sexual activity: Yes  Partners: Male    Comment: lives with husband,   Lifestyle  . Physical activity:    Days per week: Not on file    Minutes per session: Not on file  . Stress:  Not on file  Relationships  . Social connections:    Talks on phone: Not on file    Gets together: Not on file    Attends religious service: Not on file    Active member of club or organization: Not on file    Attends meetings of clubs or organizations: Not on file    Relationship status: Not on file  . Intimate partner violence:    Fear of current or ex partner: Not on file    Emotionally abused: Not on file    Physically abused: Not on file    Forced sexual activity: Not on file  Other Topics Concern  . Not on file  Social History Narrative  . Not on file   Review of systems: Review of Systems  Constitutional: Negative for fever and chills.  HENT: Negative.   Eyes: Negative for blurred vision.  Respiratory: as per HPI  Cardiovascular: Negative for chest pain and palpitations.  Gastrointestinal: Negative for vomiting, diarrhea, blood per rectum. Genitourinary: Negative for dysuria, urgency, frequency and hematuria.  Musculoskeletal: Negative for myalgias, back pain and joint pain.  Skin: Negative for itching and rash.  Neurological: Negative for dizziness, tremors, focal weakness, seizures and loss of consciousness.  Endo/Heme/Allergies: Negative for environmental allergies.  Psychiatric/Behavioral: Negative for depression, suicidal ideas and hallucinations.  All other systems reviewed and are negative.  Physical Exam: Blood pressure 100/64, pulse 88, height 5' 5.5" (1.664 m), weight 125 lb (56.7 kg), SpO2 97 %. Gen:      No acute distress HEENT:  EOMI, sclera anicteric Neck:     No masses; no thyromegaly Lungs:    Clear to auscultation bilaterally; normal respiratory effort CV:         Regular rate and rhythm; no murmurs Abd:      + bowel sounds; soft, non-tender; no palpable masses, no distension Ext:    No edema; adequate peripheral perfusion Skin:      Warm and dry; no rash Neuro: alert and oriented x 3 Psych: normal mood and affect  Data Reviewed: CT chest  07/03/2013- postradiation changes in the right upper lobe, centrilobular emphysema, nodular thickening of the pleura at the bases CT abdomen 08/10/17- visualized lung bases show minimal atelectasis, no interstitial lung disease. Chest x-ray 04/26/2018-hyperinflation, no acute cardiopulmonary abnormality. I reviewed the images personally.  CBC 04/16/2018-WBC 8.6, eos 1.8%, absolute eosinophil count 155  Spirometry 09/13/2009 FVC 1.65 (49%], FEV1 1.39 (55%], F/F 84  Assessment:  Dyspnea on exertion Likely has a combination of COPD, asthma Prior lung imaging reviewed with mild scarring in the right upper lobe consistent with postradiation changes and emphysema.  Schedule pulmonary function tests, check blood allergy profile Start Symbicort 160/4.5.  Continue to use albuterol as needed.   Plan/Recommendations: - PFTs, blood allergy profile - Start Symbicort, continue albuterol as needed.  Marshell Garfinkel MD [facility] 05/06/2018, 10:22 AM  CC: [name], MD

## 2018-05-07 ENCOUNTER — Encounter: Payer: Self-pay | Admitting: Family Medicine

## 2018-05-07 DIAGNOSIS — L814 Other melanin hyperpigmentation: Secondary | ICD-10-CM | POA: Diagnosis not present

## 2018-05-07 DIAGNOSIS — D225 Melanocytic nevi of trunk: Secondary | ICD-10-CM | POA: Diagnosis not present

## 2018-05-07 DIAGNOSIS — L820 Inflamed seborrheic keratosis: Secondary | ICD-10-CM | POA: Diagnosis not present

## 2018-05-07 DIAGNOSIS — D1801 Hemangioma of skin and subcutaneous tissue: Secondary | ICD-10-CM | POA: Diagnosis not present

## 2018-05-07 LAB — RESPIRATORY ALLERGY PROFILE REGION II ~~LOC~~
ALLERGEN, CEDAR TREE, T6: 2.04 kU/L — AB
ALLERGEN, D PTERNOYSSINUS, D1: 4.23 kU/L — AB
Allergen, Comm Silver Birch, t9: 0.12 kU/L — ABNORMAL HIGH
Allergen, Cottonwood, t14: <0.1 kU/L
Allergen, Mulberry, t76: <0.1 kU/L
Allergen, Oak,t7: <0.1 kU/L
Aspergillus fumigatus, m3: <0.1 kU/L
Bermuda Grass: <0.1 kU/L
CAT DANDER: 0.31 kU/L — AB
CLADOSPORIUM HERBARUM (M2) IGE: <0.1 kU/L
CLASS: 0
CLASS: 0
CLASS: 0
CLASS: 0
CLASS: 0
CLASS: 0
CLASS: 0
CLASS: 0
CLASS: 1
CLASS: 3
CLASS: 3
COMMON RAGWEED (SHORT) (W1) IGE: <0.1 kU/L
Class: 0
Class: 0
Class: 0
Class: 0
Class: 0
Class: 0
Class: 0
Class: 0
Class: 0
Class: 0
Class: 0
Class: 0
Class: 2
Cockroach: <0.1 kU/L
D. FARINAE: 4.12 kU/L — AB
Dog Dander: 0.22 kU/L — ABNORMAL HIGH
IGE (IMMUNOGLOBULIN E), SERUM: 271 kU/L — AB (ref <=114)
Pecan/Hickory Tree IgE: 0.66 kU/L — ABNORMAL HIGH
Rough Pigweed  IgE: <0.1 kU/L
Sheep Sorrel IgE: <0.1 kU/L

## 2018-05-07 LAB — INTERPRETATION:

## 2018-05-08 ENCOUNTER — Telehealth: Payer: Self-pay

## 2018-05-08 ENCOUNTER — Telehealth: Payer: Self-pay | Admitting: Pulmonary Disease

## 2018-05-08 NOTE — Telephone Encounter (Signed)
Patient seen 6.25.19 for consult and RAST panel done Called spoke with patient who is requesting results of the above  Dr Vaughan Browner please advise, thank you.

## 2018-05-08 NOTE — Telephone Encounter (Signed)
Left message for patient to call back  

## 2018-05-08 NOTE — Telephone Encounter (Signed)
The allergy panel shows mild to moderate sensitivity to dust mite, cat, dog, tree pollen

## 2018-05-08 NOTE — Telephone Encounter (Signed)
-----   Message from Gatha Mayer, MD sent at 05/07/2018  5:17 PM EDT ----- Regarding: RE: call and get APP appt That is absolutely ok ----- Message ----- From: Marlon Pel, RN Sent: 05/07/2018   2:55 PM To: Gatha Mayer, MD Subject: RE: call and get APP appt                      Can't do until next week ok? ----- Message ----- From: Gatha Mayer, MD Sent: 05/07/2018   9:35 AM To: Marlon Pel, RN Subject: call and get APP appt                          See my My Chart message - told her to do a MiraLALx purge but I would like her to see an APP

## 2018-05-08 NOTE — Telephone Encounter (Signed)
Attempted to call patient today regarding results. I did not receive an answer at time of call. I have left a voicemail message for pt to return call. X1  

## 2018-05-09 NOTE — Telephone Encounter (Signed)
Patient offered multiple appointments for the week of July 4th she is not available.  She will come on 05/20/18

## 2018-05-09 NOTE — Telephone Encounter (Signed)
Pt is aware of results. No further questions or concerns at this time.

## 2018-05-19 ENCOUNTER — Encounter: Payer: Self-pay | Admitting: Internal Medicine

## 2018-05-19 DIAGNOSIS — H353211 Exudative age-related macular degeneration, right eye, with active choroidal neovascularization: Secondary | ICD-10-CM | POA: Diagnosis not present

## 2018-05-19 DIAGNOSIS — Z961 Presence of intraocular lens: Secondary | ICD-10-CM | POA: Diagnosis not present

## 2018-05-19 DIAGNOSIS — H353131 Nonexudative age-related macular degeneration, bilateral, early dry stage: Secondary | ICD-10-CM | POA: Diagnosis not present

## 2018-05-20 ENCOUNTER — Encounter: Payer: Self-pay | Admitting: Pulmonary Disease

## 2018-05-20 ENCOUNTER — Encounter: Payer: Self-pay | Admitting: Gastroenterology

## 2018-05-20 ENCOUNTER — Ambulatory Visit (INDEPENDENT_AMBULATORY_CARE_PROVIDER_SITE_OTHER): Payer: PPO | Admitting: Gastroenterology

## 2018-05-20 VITALS — BP 114/68 | HR 84 | Ht 65.5 in | Wt 126.4 lb

## 2018-05-20 DIAGNOSIS — K581 Irritable bowel syndrome with constipation: Secondary | ICD-10-CM

## 2018-05-20 DIAGNOSIS — B370 Candidal stomatitis: Secondary | ICD-10-CM

## 2018-05-20 DIAGNOSIS — J029 Acute pharyngitis, unspecified: Secondary | ICD-10-CM

## 2018-05-20 MED ORDER — NYSTATIN 100000 UNIT/ML MT SUSP: 5.0000 mL | Freq: Four times a day (QID) | OROMUCOSAL | 0 refills | Status: DC

## 2018-05-20 NOTE — Progress Notes (Addendum)
05/20/2018 NABILA ALBARRACIN [id number] 04-May-1941   HISTORY OF PRESENT ILLNESS: This is a 77 year old female who is a patient of Dr. Celesta Aver.  She has GI history of irritable bowel syndrome with constipation and eosinophilic esophagitis.  She was last seen here in October 2018 for her issues with irritable bowel syndrome with constipation.  Recently she called our office and was instructed to do a bowel purge.  And she was instructed to take MiraLAX daily following that.  She is here today now complaining of "mushy stools" since being on the MiraLAX.  She tells me she is having multiple small stools per day.  Says that they are hard to control and messy/hard to clean.  She denies abdominal pain.  There is no blood in her stool.  She is also complaining of a sore throat that she says began after starting Symbicort inhaler that was given to her by her pulmonologist.   Past Medical History:  Diagnosis Date  . Abdominal pain 08/11/2017  . ALKALINE PHOSPHATASE, ELEVATED 03/15/2009  . Allergic state 06/10/2012  . Anemia   . Anxiety   . Anxiety and depression 04/28/2011  . Arthritis   . Atypical chest pain 11/30/2011  . 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 Baker's cyst of knee 05/22/2011  . Cancer ([place]) 01,  08   XRT/chemo 01-02/ lobular invasive ca  . Carotid artery disease ([place])    a. Carotid duplex 03/2014: stable 1-39% BICA, f/u due 03/2016.  [name] Chronic alcoholism in remission ([place]) 03/29/2011   Did not tolerate Klonopin, caused some confusion and bad dreams.    [name] COPD (chronic obstructive pulmonary disease) ([place])   . Dermatitis 11/23/2012  . Dysphagia   . EE (eosinophilic esophagitis)   . Elevated sed rate 08/02/2013  . Esophageal ring   . ESOPHAGEAL STRICTURE 03/29/2009  . Fall 11/23/2012  . Family history of breast cancer   . Family history of colon cancer   . Family history of ovarian cancer   . Family history of pancreatic cancer   . Folliculitis of nose  5/80/9983  . GERD (gastroesophageal reflux disease) 09/29/2009   improved s/p cholecystectomy and esophagus dilatation  . Hematuria   . History of chicken pox   . History of measles   . History of shingles    2 episodes  . Hx of echocardiogram    a. Echo 01/2013: mild LVH, EF 55-60%, normal wall motion, Gr 1 diast dysfn  . Hyperlipidemia   . Knee pain, bilateral 07/23/2011  . Medicare annual wellness visit, subsequent 06/19/2015  . Mixed hyperlipidemia 10/17/2010   Qualifier: Diagnosis of  By: Mack Guise    . Orthostasis   . Osteopenia 03/14/2011  . Personal history of chemotherapy 2001  . Personal history of radiation therapy 2001   rt breast  . 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 Pneumonia 2-10   pleurisy  . PVC's (premature ventricular contractions)    a. Event monitor 01/2013: NSR, extensive PVCs.  . Radial neck fracture 10/2011   minimally displaced  . Sinusitis, acute 12/04/2015  . Trauma 10/18/2011  . URI (upper respiratory infection) 09/02/2012  . Urinary frequency 12/12/2014  . Urinary incontinence 03/19/2012  . Vaginitis 05/22/2011   Past Surgical History:  Procedure Laterality Date  . AUGMENTATION MAMMAPLASTY Right 05/27/2007  . BREAST BIOPSY Right 01/02/2007   wire loc  . BREAST BIOPSY  12/26/2006  . BREAST LUMPECTOMY Right 2001  . BREAST RECONSTRUCTION  2008, 2009, 2010  . BREAST REDUCTION WITH MASTOPEXY Left 05/30/2017   Procedure: LEFT BREAST REDUCTION FOR SYMTERY WITH MASTOPEXY;  Surgeon: Wallace Going, DO;  Location: [facility];  Service: Plastics;  Laterality: Left;  . CHOLECYSTECTOMY  2010  . COLONOSCOPY  09/05/10   cecal avm's  . DENTAL SURGERY  05/2016   4 dental implants by Dr. Loyal Gambler.  [name] ERCP  2010    CBD stone extraction   . ESOPHAGOGASTRODUODENOSCOPY  01/08/2012   Procedure: ESOPHAGOGASTRODUODENOSCOPY (EGD);  Surgeon: Gatha Mayer, MD;  Location: [facility];  Service: Endoscopy;  Laterality: N/A;  .  ESOPHAGOGASTRODUODENOSCOPY (EGD) WITH ESOPHAGEAL DILATION  2010, 2012  . LAPAROSCOPIC APPENDECTOMY N/A 08/04/2016   Procedure: APPENDECTOMY LAPAROSCOPIC;  Surgeon: Michael Boston, MD;  Location: [facility];  Service: General;  Laterality: N/A;  . LIPOSUCTION WITH LIPOFILLING Left 11/21/2017   Procedure: LIPOSUCTION FROM ABDOMEN WITH LIPOFILLING TO LEFT BREAST;  Surgeon: Wallace Going, DO;  Location: [facility];  Service: Plastics;  Laterality: Left;  [name] MASTECTOMY MODIFIED RADICAL Right 05/27/2007   , Mastectomy modified radical (08), breast reconstruction, CA lesions excised lateral abd wall 2010  . MASTOPEXY Left 11/21/2017   Procedure: LEFT BREAST REVISION MASTOPEXY FOR SYMMETRY;  Surgeon: Wallace Going, DO;  Location: [facility];  Service: Plastics;  Laterality: Left;  . REDUCTION MAMMAPLASTY Left   . SAVORY DILATION  01/08/2012   Procedure: SAVORY DILATION;  Surgeon: Gatha Mayer, MD;  Location: [facility];  Service: Endoscopy;  Laterality: N/A;  need xray    reports that she quit smoking about 11 years ago. Her smoking use included cigarettes. She has a 50.00 pack-year smoking history. She has never used smokeless tobacco. She reports that she drank alcohol. She reports that she does not use drugs. family history includes Alcohol abuse in her maternal grandfather; Anxiety disorder in her sister; Arthritis in her sister; Breast cancer in her cousin, cousin, maternal aunt, maternal aunt, maternal aunt, and paternal aunt; Cirrhosis in her sister; Colon cancer in her maternal uncle; Heart disease in her father and paternal grandfather; Lung cancer in her father and paternal aunt; Osteoporosis in her sister and sister; Other in her mother; Ovarian cancer (age of onset: 45) in her maternal aunt; Pancreatic cancer in her maternal aunt and maternal uncle; Skin cancer in her sister; Stomach cancer in her maternal uncle; Stroke in her maternal grandmother. Allergies    Allergen Reactions  . Codeine Other (See Comments)    "flu like symptoms"  . Morphine Other (See Comments)    "flu like symptoms"  . Peanut-Containing Drug Products Hives  . Sorbitol Other (See Comments)    GI Issues  . Tomato Diarrhea  . Zofran Other (See Comments)    headache  . Advil [Ibuprofen] Other (See Comments)    Irritates throat.  . Diphenhydramine Hcl Other (See Comments)    "nervous and upset"  . Oxycodone Anxiety    Confusion with inability to think clearly      Outpatient Encounter Medications as of 05/20/2018  Medication Sig  . acetaminophen (TYLENOL) 500 MG tablet Take 500-1,000 mg by mouth every 6 (six) hours as needed (for pain.).  [name] amitriptyline (ELAVIL) 150 MG tablet Take 1 tablet (150 mg total) by mouth at bedtime.  [name] aspirin EC 81 MG tablet Take 81 mg by mouth daily.  . budesonide-formoterol (SYMBICORT) 160-4.5 MCG/ACT inhaler Inhale 2 puffs into the lungs 2 (two) times daily for 1 day.  [name]  Cholecalciferol (VITAMIN D3) 1000 UNITS CAPS Take 2,000 Units by mouth daily.   . Cyanocobalamin (VITAMIN B 12 PO) Take 1 tablet by mouth daily.  [name] diltiazem (CARDIZEM) 30 MG tablet TAKE 1 TABLET BY MOUTH ONCE DAILY FOR PALPITATIONS  . loratadine (CLARITIN) 10 MG tablet Take 1 tablet (10 mg total) by mouth daily.  [name] LORazepam (ATIVAN) 1 MG tablet Take 1 tablet (1 mg total) by mouth every 8 (eight) hours as needed for anxiety.  . pantoprazole (PROTONIX) 40 MG tablet Take 1 tablet (40 mg total) by mouth daily.  [name] Spacer/Aero-Holding Chambers (AEROCHAMBER PLUS WITH MASK) inhaler Use as instructed  . triamcinolone cream (KENALOG) 0.1 % Apply topically 2 (two) times daily. Apply to affected area  . albuterol (PROVENTIL HFA;VENTOLIN HFA) 108 (90 Base) MCG/ACT inhaler Inhale 1-2 puffs into the lungs every 6 (six) hours as needed for wheezing or shortness of breath. (Patient not taking: Reported on 05/20/2018)  . AMBULATORY NON FORMULARY MEDICATION Medication Name: Budesonide 2 mg/10  ml Please take 1 mg by mouth two times a day (Patient not taking: Reported on 05/20/2018)  . atorvastatin (LIPITOR) 20 MG tablet TAKE 1 TABLET(20 MG) BY MOUTH TWICE DAILY (Patient taking differently: TAKE 2 TABLET(20 MG) BY MOUTH once DAILY)  . [DISCONTINUED] budesonide-formoterol (SYMBICORT) 160-4.5 MCG/ACT inhaler Inhale 2 puffs into the lungs 2 (two) times daily for 1 day.  . [DISCONTINUED] ferrous sulfate 325 (65 FE) MG tablet Take 325 mg by mouth daily with breakfast.  . [DISCONTINUED] fluticasone (FLONASE) 50 MCG/ACT nasal spray Place 2 sprays into both nostrils daily.  . [DISCONTINUED] montelukast (SINGULAIR) 10 MG tablet Take 1 tablet (10 mg total) by mouth at bedtime as needed.   No facility-administered encounter medications on file as of 05/20/2018.      REVIEW OF SYSTEMS  : All other systems reviewed and negative except where noted in the History of Present Illness.   PHYSICAL EXAM: BP 114/68   Pulse 84   Ht 5' 5.5" (1.664 m)   Wt 126 lb 6.4 oz (57.3 kg)   BMI 20.71 kg/m  General: Well developed white female in no acute distress Head: Normocephalic and atraumatic Eyes:  Sclerae anicteric, conjunctiva pink. Ears: Normal auditory acuity Mouth:  Possibly mild thrush on tongue.  Throat looks clear. Lungs: Clear throughout to auscultation; no increased WOB. Heart: Regular rate and rhythm; no M/R/G. Abdomen: Soft, non-distended.  BS present.  Non-tender. Musculoskeletal: Symmetrical with no gross deformities  Skin: No lesions on visible extremities Extremities: No edema  Neurological: Alert oriented x 4, grossly non-focal Psychological:  Alert and cooperative. Normal mood and affect  ASSESSMENT AND PLAN: *IBS-C:  Sounds like she needs some bulk to her stools.  Will continue Miralax daily but needs to add once or twice daily Benefiber powder as well. *Sore throat:  ? Oral candidiasis.  Says that sore throat started while using Symbicort.  Exam not overly impressive for thrush,  but will treat with one week of nystatin swish and swallow.  Needs to discuss further with pulmonary about inhaler or PCP for further issues regarding sore throat.  **25 minutes spent with the patient in which at least 50% was spent in discussion of condition and treatment options.   CC:  [name], MD  Agree with Ms. Alphia Kava management.  Gatha Mayer, MD, [facility]

## 2018-05-20 NOTE — Patient Instructions (Signed)
Use Miralax once daily.   Use Benefiber twice a day.    Use Nystatin solution swish and swallow for 7 days.

## 2018-05-21 ENCOUNTER — Other Ambulatory Visit: Payer: Self-pay | Admitting: Family Medicine

## 2018-05-21 NOTE — Telephone Encounter (Signed)
It is OK to stop the symbicort if it is causing the sore throat and thrush. Continue the nystatin.  We will review at time of clinic visit if we need an alternative inhaler. We had scheduled Pulmonary function tests at last visit and I don't see them scheduled. I will check with office to make sure they get done before return visit as it would help guide therapy.  Marshell Garfinkel MD [facility] 05/21/2018, 1:00 PM

## 2018-05-21 NOTE — Telephone Encounter (Signed)
LMOM TCB x1 for patient to schedule PFT on day of/prior to 7/30 appt with Dr Vaughan Browner E-mail also sent to patient with Dr Matilde Bash response and that we atc her for PFT scheduling

## 2018-05-21 NOTE — Telephone Encounter (Signed)
Last Friday I developed a sore throat which continued through Sunday, and at which time I ended my use of both inhalers. Although I have rinsed my mouth after each inhaler use, my throat became sore and continues to be. I don't believe the inhaler I was using before becoming Dr. Matilde Bash patient is the problem as that inhaler came from a visit to [facility] 14 days ago, and I had no problems with it. Today, I had an appointment with Dr. Celesta Aver PA, and after noticing some yeast in my mouth, she prescribed nystatin which I am taking.     My concern in writing earlier was the possibility of the Symbort inhaler causing my sore throat and irritating my esophagitis. At this time, please advise Dr. Vaughan Browner of my concern.    Thank you.    Anne [name]   Dr. Vaughan Browner, please advise. Thanks!

## 2018-05-25 ENCOUNTER — Encounter: Payer: Self-pay | Admitting: Internal Medicine

## 2018-05-25 ENCOUNTER — Encounter: Payer: Self-pay | Admitting: Family Medicine

## 2018-05-26 ENCOUNTER — Encounter: Payer: Self-pay | Admitting: Internal Medicine

## 2018-05-27 ENCOUNTER — Other Ambulatory Visit: Payer: Self-pay | Admitting: Family Medicine

## 2018-05-27 ENCOUNTER — Encounter: Payer: Self-pay | Admitting: Gastroenterology

## 2018-05-27 DIAGNOSIS — B370 Candidal stomatitis: Secondary | ICD-10-CM | POA: Insufficient documentation

## 2018-05-27 DIAGNOSIS — J029 Acute pharyngitis, unspecified: Secondary | ICD-10-CM | POA: Insufficient documentation

## 2018-05-27 MED ORDER — AMITRIPTYLINE HCL 75 MG PO TABS: 150.0000 mg | ORAL_TABLET | Freq: Every day | ORAL | 5 refills | Status: DC

## 2018-06-02 ENCOUNTER — Encounter: Payer: Self-pay | Admitting: Internal Medicine

## 2018-06-06 ENCOUNTER — Encounter: Payer: Self-pay | Admitting: Family Medicine

## 2018-06-06 ENCOUNTER — Other Ambulatory Visit: Payer: Self-pay

## 2018-06-06 ENCOUNTER — Encounter: Payer: Self-pay | Admitting: Hematology

## 2018-06-06 DIAGNOSIS — Z853 Personal history of malignant neoplasm of breast: Secondary | ICD-10-CM

## 2018-06-09 ENCOUNTER — Ambulatory Visit: Payer: PPO | Admitting: Internal Medicine

## 2018-06-10 ENCOUNTER — Ambulatory Visit: Payer: PPO | Admitting: Pulmonary Disease

## 2018-06-12 ENCOUNTER — Encounter: Payer: Self-pay | Admitting: Internal Medicine

## 2018-06-12 ENCOUNTER — Encounter: Payer: Self-pay | Admitting: Family Medicine

## 2018-06-17 ENCOUNTER — Encounter: Payer: Self-pay | Admitting: Pulmonary Disease

## 2018-06-17 ENCOUNTER — Ambulatory Visit: Payer: PPO | Admitting: Pulmonary Disease

## 2018-06-17 VITALS — BP 128/70 | HR 95 | Ht 65.5 in | Wt 128.2 lb

## 2018-06-17 DIAGNOSIS — J4520 Mild intermittent asthma, uncomplicated: Secondary | ICD-10-CM | POA: Insufficient documentation

## 2018-06-17 NOTE — Progress Notes (Signed)
Anne [name]    [id number]    10-31-1941  Primary Care Physician:[name], [name], MD  Referring Physician: [name], MD [address] [place], [place] [address]  Chief complaint: Follow-up for mild intermittent asthma, emphysema  HPI: 77 year old with history of breast cancer, emphysema, eosinophilic esophagitis, palpitations, hyperlipidemia Complains of progressive dyspnea on exertion for the past 2 years.  She does not have symptoms at rest.  Denies any cough, sputum production, wheezing.  She is on albuterol which she is using a couple of times per week.  She has significant seasonal allergies, notes episodes of increasing dyspnea, tightness after working in her yard.  She also has acid reflux, eosinophilic esophagitis and follows with Dr. [name].  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 Smoking history: 50-70-pack-year smoking history.  Quit in 2008. Travel history: No significant travel.  Interim history: Started on Symbicort at last visit.  She stopped using it after few weeks as it made her hoarse.  States that her breathing is doing much better and is hardly using her albuterol inhaler Feels that the breathing got much better after they cleaned up the home and got rid of test.  Outpatient Encounter Medications as of 06/17/2018  Medication Sig  . acetaminophen (TYLENOL) 500 MG tablet Take 500-1,000 mg by mouth every 6 (six) hours as needed (for pain.).  [name] AMBULATORY NON FORMULARY MEDICATION Medication Name: Budesonide 2 mg/10 ml Please take 1 mg by mouth two times a day  . amitriptyline (ELAVIL) 75 MG tablet Take 2 tablets (150 mg total) by mouth at bedtime.  [name] aspirin EC 81 MG tablet Take 81 mg by mouth daily.  [name] atorvastatin (LIPITOR) 20 MG tablet TAKE 1 TABLET(20 MG) BY MOUTH  TWICE DAILY (Patient taking differently: TAKE 2 TABLET(20 MG) BY MOUTH once DAILY)  . Cholecalciferol (VITAMIN D3) 1000 UNITS CAPS Take 2,000 Units by mouth daily.   . Cyanocobalamin (VITAMIN B 12 PO) Take 1 tablet by mouth daily.  [name] diltiazem (CARDIZEM) 30 MG tablet TAKE 1 TABLET BY MOUTH ONCE DAILY FOR PALPITATIONS  . loratadine (CLARITIN) 10 MG tablet Take 1 tablet (10 mg total) by mouth daily.  [name] LORazepam (ATIVAN) 1 MG tablet Take 1 tablet (1 mg total) by mouth every 8 (eight) hours as needed for anxiety.  [name] nystatin (MYCOSTATIN) 100000 UNIT/ML suspension Use as directed 5 mLs (500,000 Units total) in the mouth or throat 4 (four) times daily. Swish and swallow  . pantoprazole (PROTONIX) 40 MG tablet Take 1 tablet (40 mg total) by mouth daily.  [name] Spacer/Aero-Holding Chambers (AEROCHAMBER PLUS WITH MASK) inhaler Use as instructed  . triamcinolone cream (KENALOG) 0.1 % Apply topically 2 (two) times daily. Apply to affected area  . [DISCONTINUED] albuterol (PROVENTIL HFA;VENTOLIN HFA) 108 (90 Base) MCG/ACT inhaler Inhale 1-2 puffs into the lungs every 6 (six) hours as needed for wheezing or shortness of breath. (Patient not taking: Reported on 06/17/2018)  . [DISCONTINUED] budesonide-formoterol (SYMBICORT) 160-4.5 MCG/ACT inhaler Inhale 2 puffs into the lungs 2 (two) times daily for 1 day. (Patient not taking: Reported on 06/17/2018)   No facility-administered encounter medications on file as of 06/17/2018.     Allergies as of 06/17/2018 - Review Complete 06/17/2018  Allergen Reaction Noted  . Codeine Other (See Comments) 04/02/2016  .  Morphine Other (See Comments) 04/02/2016  . Peanut-containing drug products Hives 06/10/2012  . Sorbitol Other (See Comments) 07/06/2015  . Tomato Diarrhea 05/31/2014  . Zofran Other (See Comments) 07/07/2004  . Advil [ibuprofen] Other (See Comments) 05/20/2017  . Diphenhydramine hcl Other (See Comments)   . Oxycodone Anxiety 06/07/2017    Past Medical History:   Diagnosis Date  . Abdominal pain 08/11/2017  . ALKALINE PHOSPHATASE, ELEVATED 03/15/2009  . Allergic state 06/10/2012  . Anemia   . Anxiety   . Anxiety and depression 04/28/2011  . Arthritis   . Atypical chest pain 11/30/2011  . 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 Baker's cyst of knee 05/22/2011  . Cancer ([facility]) 01,  08   XRT/chemo 01-02/ lobular invasive ca  . Carotid artery disease ([facility])    a. Carotid duplex 03/2014: stable 1-39% BICA, f/u due 03/2016.  [name] Chronic alcoholism in remission ([place]) 03/29/2011   Did not tolerate Klonopin, caused some confusion and bad dreams.    [name] COPD (chronic obstructive pulmonary disease) ([facility])   . Dermatitis 11/23/2012  . Dysphagia   . EE (eosinophilic esophagitis)   . Elevated sed rate 08/02/2013  . Esophageal ring   . ESOPHAGEAL STRICTURE 03/29/2009  . Fall 11/23/2012  . Family history of breast cancer   . Family history of colon cancer   . Family history of ovarian cancer   . Family history of pancreatic cancer   . Folliculitis of nose 2/87/8676  . GERD (gastroesophageal reflux disease) 09/29/2009   improved s/p cholecystectomy and esophagus dilatation  . Hematuria   . History of chicken pox   . History of measles   . History of shingles    2 episodes  . Hx of echocardiogram    a. Echo 01/2013: mild LVH, EF 55-60%, normal wall motion, Gr 1 diast dysfn  . Hyperlipidemia   . Knee pain, bilateral 07/23/2011  . Medicare annual wellness visit, subsequent 06/19/2015  . Mixed hyperlipidemia 10/17/2010   Qualifier: Diagnosis of  By: Mack Guise    . Orthostasis   . Osteopenia 03/14/2011  . Personal history of chemotherapy 2001  . Personal history of radiation therapy 2001   rt breast  . 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 Pneumonia 2-10   pleurisy  . PVC's (premature ventricular contractions)    a. Event monitor 01/2013: NSR, extensive PVCs.  . Radial neck fracture 10/2011   minimally displaced  . Sinusitis, acute  12/04/2015  . Trauma 10/18/2011  . URI (upper respiratory infection) 09/02/2012  . Urinary frequency 12/12/2014  . Urinary incontinence 03/19/2012  . Vaginitis 05/22/2011    Past Surgical History:  Procedure Laterality Date  . AUGMENTATION MAMMAPLASTY Right 05/27/2007  . BREAST BIOPSY Right 01/02/2007   wire loc  . BREAST BIOPSY  12/26/2006  . BREAST LUMPECTOMY Right 2001  . BREAST RECONSTRUCTION  2008, 2009, 2010  . BREAST REDUCTION WITH MASTOPEXY Left 05/30/2017   Procedure: LEFT BREAST REDUCTION FOR SYMTERY WITH MASTOPEXY;  Surgeon: Wallace Going, DO;  Location: [facility];  Service: Plastics;  Laterality: Left;  . CHOLECYSTECTOMY  2010  . COLONOSCOPY  09/05/10   cecal avm's  . DENTAL SURGERY  05/2016   4 dental implants by Dr. Loyal Gambler.  [name] ERCP  2010    CBD stone extraction   . ESOPHAGOGASTRODUODENOSCOPY  01/08/2012   Procedure: ESOPHAGOGASTRODUODENOSCOPY (EGD);  Surgeon: Gatha Mayer, MD;  Location: [facility];  Service: Endoscopy;  Laterality: N/A;  .  ESOPHAGOGASTRODUODENOSCOPY (EGD) WITH ESOPHAGEAL DILATION  2010, 2012  . LAPAROSCOPIC APPENDECTOMY N/A 08/04/2016   Procedure: APPENDECTOMY LAPAROSCOPIC;  Surgeon: Michael Boston, MD;  Location: [facility];  Service: General;  Laterality: N/A;  . LIPOSUCTION WITH LIPOFILLING Left 11/21/2017   Procedure: LIPOSUCTION FROM ABDOMEN WITH LIPOFILLING TO LEFT BREAST;  Surgeon: Wallace Going, DO;  Location: [facility];  Service: Plastics;  Laterality: Left;  [name] MASTECTOMY MODIFIED RADICAL Right 05/27/2007   , Mastectomy modified radical (08), breast reconstruction, CA lesions excised lateral abd wall 2010  . MASTOPEXY Left 11/21/2017   Procedure: LEFT BREAST REVISION MASTOPEXY FOR SYMMETRY;  Surgeon: Wallace Going, DO;  Location: [facility];  Service: Plastics;  Laterality: Left;  . REDUCTION MAMMAPLASTY Left   . SAVORY DILATION  01/08/2012   Procedure: SAVORY DILATION;  Surgeon: Gatha Mayer, MD;  Location: [facility];  Service: Endoscopy;  Laterality: N/A;  need xray    Family History  Problem Relation Age of Onset  . Heart disease Father   . Lung cancer Father        smoker  . Cirrhosis Sister        Primary Biliary  . Stroke Maternal Grandmother   . Alcohol abuse Maternal Grandfather   . Heart disease Paternal Grandfather   . Anxiety disorder Sister   . Osteoporosis Sister   . Arthritis Sister        Rheumatoid  . Osteoporosis Sister   . Skin cancer Sister        multiple skin cancers, over 37 excisions.  . Other Mother        tic [place]  . Breast cancer Maternal Aunt        dx in her 18s  . Breast cancer Paternal Aunt        dx in her 17s  . Pancreatic cancer Maternal Uncle        dx in his 39s; smoker  . Breast cancer Maternal Aunt        dx in her 64s  . Breast cancer Maternal Aunt        possible breast cancer dx and died in her 86s  . Ovarian cancer Maternal Aunt 29  . Pancreatic cancer Maternal Aunt        dx in her 60s  . Stomach cancer Maternal Uncle   . Colon cancer Maternal Uncle   . Lung cancer Paternal Aunt   . Breast cancer Cousin        paternal first cousin  . Breast cancer Cousin        maternal first cousin  . Anesthesia problems Neg Hx   . Hypotension Neg Hx   . Malignant hyperthermia Neg Hx   . Pseudochol deficiency Neg Hx     Social History   Socioeconomic History  . Marital status: Married    Spouse name: Sam  . Number of children: 3  . Years of education: Not on file  . Highest education level: Not on file  Occupational History  . Occupation: Social worker  Social Needs  . Financial resource strain: Not on file  . Food insecurity:    Worry: Not on file    Inability: Not on file  . Transportation needs:    Medical: Not on file    Non-medical: Not on file  Tobacco Use  . Smoking status: Former Smoker    Packs/day: 1.00    Years: 50.00    Pack years: 50.00  Types: Cigarettes    Last attempt to  quit: 05/22/2007    Years since quitting: 11.0  . Smokeless tobacco: Never Used  Substance and Sexual Activity  . Alcohol use: Not Currently  . Drug use: No  . Sexual activity: Yes    Partners: Male    Comment: lives with husband,   Lifestyle  . Physical activity:    Days per week: Not on file    Minutes per session: Not on file  . Stress: Not on file  Relationships  . Social connections:    Talks on phone: Not on file    Gets together: Not on file    Attends religious service: Not on file    Active member of club or organization: Not on file    Attends meetings of clubs or organizations: Not on file    Relationship status: Not on file  . Intimate partner violence:    Fear of current or ex partner: Not on file    Emotionally abused: Not on file    Physically abused: Not on file    Forced sexual activity: Not on file  Other Topics Concern  . Not on file  Social History Narrative  . Not on file   Review of systems: Review of Systems  Constitutional: Negative for fever and chills.  HENT: Negative.   Eyes: Negative for blurred vision.  Respiratory: as per HPI  Cardiovascular: Negative for chest pain and palpitations.  Gastrointestinal: Negative for vomiting, diarrhea, blood per rectum. Genitourinary: Negative for dysuria, urgency, frequency and hematuria.  Musculoskeletal: Negative for myalgias, back pain and joint pain.  Skin: Negative for itching and rash.  Neurological: Negative for dizziness, tremors, focal weakness, seizures and loss of consciousness.  Endo/Heme/Allergies: Negative for environmental allergies.  Psychiatric/Behavioral: Negative for depression, suicidal ideas and hallucinations.  All other systems reviewed and are negative.  Physical Exam: Blood pressure 128/70, pulse 95, height 5' 5.5" (1.664 m), weight 128 lb 3.2 oz (58.2 kg), SpO2 97 %. Gen:      No acute distress HEENT:  EOMI, sclera anicteric Neck:     No masses; no thyromegaly Lungs:    Clear  to auscultation bilaterally; normal respiratory effort CV:         Regular rate and rhythm; no murmurs Abd:      + bowel sounds; soft, non-tender; no palpable masses, no distension Ext:    No edema; adequate peripheral perfusion Skin:      Warm and dry; no rash Neuro: alert and oriented x 3 Psych: normal mood and affect  Data Reviewed: Imaging CT chest 07/03/2013- postradiation changes in the right upper lobe, centrilobular emphysema, nodular thickening of the pleura at the bases CT abdomen 08/10/17- visualized lung bases show minimal atelectasis, no interstitial lung disease. Chest x-ray 04/26/2018-hyperinflation, no acute cardiopulmonary abnormality. I reviewed the images personally.  Labs CBC 04/16/2018-WBC 8.6, eos 1.8%, absolute eosinophil count 155 2 profile 05/06/1989-IgE 271, sensitive to dust mite.  Mild allergies to cat, dog, tree pollen  Spirometry 09/13/2009 FVC 1.65 (49%], FEV1 1.39 (55%], F/F 84  Assessment:  Mild intermittent asthma RAST panel sensitivity to dust mite Symptoms improved after she cleaned up her home.  Agree with stopping regular inhalers as she developed hoarseness with Symbicort Continue albuterol as needed Educated on keeping a clean environment.  Emphysema Prior lung imaging reviewed with mild scarring in the right upper lobe consistent with postradiation changes and emphysema. PFTs were ordered but have not been scheduled yet. Rechedule PFTs,  observe off inhalers   Plan/Recommendations: - Albuterol as needed - PFTs  Marshell Garfinkel MD [facility] 06/17/2018, 4:31 PM  CC: [name], MD

## 2018-06-17 NOTE — Patient Instructions (Signed)
I am glad you are feeling better It is okay to hold off on the inhalers as his symptoms are under good control Is a good idea to clean up the house and get air filters as you do have significant allergies to dust mite  Follow-up in 6 months with PFTs.

## 2018-06-18 ENCOUNTER — Encounter: Payer: Self-pay | Admitting: Internal Medicine

## 2018-06-23 ENCOUNTER — Encounter: Payer: Self-pay | Admitting: Hematology

## 2018-06-23 ENCOUNTER — Other Ambulatory Visit: Payer: Self-pay | Admitting: Internal Medicine

## 2018-06-23 DIAGNOSIS — K5909 Other constipation: Secondary | ICD-10-CM

## 2018-06-23 DIAGNOSIS — M8589 Other specified disorders of bone density and structure, multiple sites: Secondary | ICD-10-CM | POA: Diagnosis not present

## 2018-06-23 LAB — HM DEXA SCAN

## 2018-06-24 ENCOUNTER — Telehealth: Payer: Self-pay

## 2018-06-24 ENCOUNTER — Ambulatory Visit (INDEPENDENT_AMBULATORY_CARE_PROVIDER_SITE_OTHER)
Admission: RE | Admit: 2018-06-24 | Discharge: 2018-06-24 | Disposition: A | Discharge status: Home / Self Care | Payer: PPO | Source: Ambulatory Visit | Attending: Internal Medicine | Admitting: Internal Medicine

## 2018-06-24 DIAGNOSIS — K5900 Constipation, unspecified: Secondary | ICD-10-CM | POA: Diagnosis not present

## 2018-06-24 DIAGNOSIS — K5909 Other constipation: Secondary | ICD-10-CM | POA: Diagnosis not present

## 2018-06-24 DIAGNOSIS — H353211 Exudative age-related macular degeneration, right eye, with active choroidal neovascularization: Secondary | ICD-10-CM | POA: Diagnosis not present

## 2018-06-24 DIAGNOSIS — H353131 Nonexudative age-related macular degeneration, bilateral, early dry stage: Secondary | ICD-10-CM | POA: Diagnosis not present

## 2018-06-24 NOTE — Telephone Encounter (Signed)
Left her a detailed message to check her mychart , to come get a x-ray today or tomorrow prior to her appointment. I told her to call me back so I can put her on our schedule for Dr [name] for tomorrow.

## 2018-06-24 NOTE — Telephone Encounter (Signed)
Left her a detailed message to call me back. °

## 2018-06-24 NOTE — Telephone Encounter (Signed)
-----   Message from Gatha Mayer, MD sent at 06/23/2018  5:15 PM EDT ----- Regarding: add on Wed at 0930 See My Chart notes  I ordered an Xray for her to do tomorrow and would like to see her 0930 Wed  If she tries to do the Xray on wed tell her I want the results prior to the visit so ask that she does it tomorrow

## 2018-06-25 ENCOUNTER — Encounter: Payer: Self-pay | Admitting: Hematology

## 2018-06-25 NOTE — Telephone Encounter (Signed)
Left her a detailed message that we could work her in 06/30/18 at 2:15pm if that works to call me or MyChart me so I can put her on the books.

## 2018-06-25 NOTE — Telephone Encounter (Signed)
I spoke with Anne [name] and informed her as Dr [name] advised to do the bowel purge. She will let us know how that goes. She does want to book the 06/30/18 appointment with Dr [name] at 2:15pm.

## 2018-06-25 NOTE — Progress Notes (Signed)
Xray shows that she is full of stool - not a surprise  She needs to do a MiraLax purge  4 dulcolax 5 mg tabs and wait 1 hour then drink 6 doses of MiraLAx (1 cap or tablespoon) in 6 oz water or favorite clear beverage over 2 hours  She should let us know how she is after that is completed and what the results are and I will advise next steps but want to know how cleanout went  She needs a follow-up but only wants Mon or Tues due to husbands work schedule and I have nothing available  ? If there is an APP appointment when I am in office on a Monday/tuesday to tag team it

## 2018-06-30 ENCOUNTER — Ambulatory Visit: Payer: PPO | Admitting: Internal Medicine

## 2018-06-30 ENCOUNTER — Telehealth: Payer: Self-pay | Admitting: Internal Medicine

## 2018-06-30 ENCOUNTER — Telehealth: Payer: Self-pay | Admitting: *Deleted

## 2018-06-30 NOTE — Telephone Encounter (Signed)
Pt's husband called to inform that pt feel in her backyard while doing some gardening and will not be able to come in this afternoon for her 2:15 pm visit with Dr. [name], pt wanted to r/s but is requesting a sooner appt with Dr. [name], stated that she cannot wait until October.

## 2018-06-30 NOTE — Telephone Encounter (Signed)
Left message for patient to call back  

## 2018-06-30 NOTE — Telephone Encounter (Signed)
Received bone density results done 06/23/18 at [facility].  Gave results to Dr. Burr Medico for review.

## 2018-06-30 NOTE — Telephone Encounter (Signed)
Patient is rescheduled to 07/21/18 2:45.  She is aware

## 2018-07-01 ENCOUNTER — Encounter: Payer: Self-pay | Admitting: Family Medicine

## 2018-07-01 ENCOUNTER — Telehealth: Payer: Self-pay

## 2018-07-01 NOTE — Telephone Encounter (Signed)
Left voice message for patient that we received bone density scan results decrease of 6%, instructed her to continue taking Vitamin D & Calcium and we will discuss at her next follow up visit.   Encouraged her to call back if she has questions.

## 2018-07-03 ENCOUNTER — Encounter: Payer: Self-pay | Admitting: Hematology

## 2018-07-09 ENCOUNTER — Encounter: Payer: Self-pay | Admitting: Family Medicine

## 2018-07-09 ENCOUNTER — Other Ambulatory Visit: Payer: Self-pay | Admitting: Family Medicine

## 2018-07-09 DIAGNOSIS — S299XXA Unspecified injury of thorax, initial encounter: Secondary | ICD-10-CM

## 2018-07-09 NOTE — Telephone Encounter (Signed)
Refill request for LORazepam (ATIVAN) 1 MG tablet. Last OV: 04/29/18 Last RF: 12/23/17  CSC: 12-23-17 completed and signed UDS sample given.

## 2018-07-10 ENCOUNTER — Other Ambulatory Visit: Payer: Self-pay | Admitting: Family Medicine

## 2018-07-10 DIAGNOSIS — S299XXA Unspecified injury of thorax, initial encounter: Secondary | ICD-10-CM

## 2018-07-10 MED ORDER — LORAZEPAM 1 MG PO TABS: 1.0000 mg | ORAL_TABLET | Freq: Three times a day (TID) | ORAL | 1 refills | Status: DC | PRN

## 2018-07-10 NOTE — Telephone Encounter (Signed)
Requesting:Lorazepam Contract:12/23/17 UDS:12/23/17 Last Visit:04/29/18 Next Visit:none with pcp Last Refill:yesterday  Please Advise

## 2018-07-11 ENCOUNTER — Telehealth: Payer: Self-pay

## 2018-07-11 NOTE — Telephone Encounter (Signed)
error 

## 2018-07-18 ENCOUNTER — Telehealth: Payer: Self-pay | Admitting: Internal Medicine

## 2018-07-18 NOTE — Telephone Encounter (Signed)
Patient advised if she is better she can cancel the appt.

## 2018-07-21 ENCOUNTER — Ambulatory Visit: Payer: PPO | Admitting: Internal Medicine

## 2018-08-05 DIAGNOSIS — H353211 Exudative age-related macular degeneration, right eye, with active choroidal neovascularization: Secondary | ICD-10-CM | POA: Diagnosis not present

## 2018-08-05 DIAGNOSIS — H35361 Drusen (degenerative) of macula, right eye: Secondary | ICD-10-CM | POA: Diagnosis not present

## 2018-08-05 DIAGNOSIS — H353131 Nonexudative age-related macular degeneration, bilateral, early dry stage: Secondary | ICD-10-CM | POA: Diagnosis not present

## 2018-08-05 DIAGNOSIS — Z961 Presence of intraocular lens: Secondary | ICD-10-CM | POA: Diagnosis not present

## 2018-08-09 ENCOUNTER — Other Ambulatory Visit: Payer: Self-pay | Admitting: Internal Medicine

## 2018-08-10 ENCOUNTER — Encounter: Payer: Self-pay | Admitting: Family Medicine

## 2018-08-11 ENCOUNTER — Encounter: Payer: Self-pay | Admitting: Pulmonary Disease

## 2018-08-11 ENCOUNTER — Ambulatory Visit (INDEPENDENT_AMBULATORY_CARE_PROVIDER_SITE_OTHER): Payer: PPO | Admitting: Pulmonary Disease

## 2018-08-11 VITALS — BP 128/78 | HR 89 | Ht 65.5 in | Wt 132.6 lb

## 2018-08-11 DIAGNOSIS — J439 Emphysema, unspecified: Secondary | ICD-10-CM | POA: Diagnosis not present

## 2018-08-11 DIAGNOSIS — J4520 Mild intermittent asthma, uncomplicated: Secondary | ICD-10-CM | POA: Diagnosis not present

## 2018-08-11 DIAGNOSIS — R0602 Shortness of breath: Secondary | ICD-10-CM | POA: Diagnosis not present

## 2018-08-11 LAB — NITRIC OXIDE: NITRIC OXIDE: 28

## 2018-08-11 MED ORDER — FLUTICASONE-UMECLIDIN-VILANT 100-62.5-25 MCG/INH IN AEPB: 1.0000 | INHALATION_SPRAY | Freq: Every day | RESPIRATORY_TRACT | 0 refills | Status: DC

## 2018-08-11 NOTE — Patient Instructions (Signed)
We will schedule you for high-resolution CT of the chest We will start you on trelegy inhaler  Follow-up in 1 to 2 months.

## 2018-08-11 NOTE — Progress Notes (Signed)
Anne [name]    [id number]    [birth year]  Primary Care Physician:[name], [name], MD  Referring Physician: [name], MD [address] [place], [place] [address]  Chief complaint: Follow-up for mild intermittent asthma, emphysema  HPI: 77 year old with history of breast cancer, emphysema, eosinophilic esophagitis, palpitations, hyperlipidemia Complains of progressive dyspnea on exertion for the past 2 years.  She does not have symptoms at rest.  Denies any cough, sputum production, wheezing.  She is on albuterol which she is using a couple of times per week.  She has significant seasonal allergies, notes episodes of increasing dyspnea, tightness after working in her yard.  She also has acid reflux, eosinophilic esophagitis and follows with Dr. [name].  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 Smoking history: 50-70-pack-year smoking history.  Quit in 2008. Travel history: No significant travel.  Interim history: Started on Symbicort at last visit in June 2018.  She stopped using it after few weeks as it made her hoarse.   Complains of worsening dyspnea over the past few weeks.  Symptoms occurred with exertion.  No symptoms at rest Denies any cough, sputum production, wheezing, chest congestion.  Outpatient Encounter Medications as of 08/11/2018  Medication Sig  . acetaminophen (TYLENOL) 500 MG tablet Take 500-1,000 mg by mouth every 6 (six) hours as needed (for pain.).  [name] AMBULATORY NON FORMULARY MEDICATION Medication Name: Budesonide 2 mg/10 ml Please take 1 mg by mouth two times a day  . amitriptyline (ELAVIL) 75 MG tablet Take 2 tablets (150 mg total) by mouth at bedtime.  [name] aspirin EC 81 MG tablet Take 81 mg by mouth daily.  [name] atorvastatin (LIPITOR) 20 MG tablet TAKE 1 TABLET(20 MG) BY  MOUTH TWICE DAILY (Patient taking differently: TAKE 2 TABLET(20 MG) BY MOUTH once DAILY)  . Cholecalciferol (VITAMIN D3) 1000 UNITS CAPS Take 2,000 Units by mouth daily.   . Cyanocobalamin (VITAMIN B 12 PO) Take 1 tablet by mouth daily.  [name] diltiazem (CARDIZEM) 30 MG tablet TAKE 1 TABLET BY MOUTH ONCE DAILY FOR PALPITATIONS  . loratadine (CLARITIN) 10 MG tablet Take 1 tablet (10 mg total) by mouth daily.  [name] LORazepam (ATIVAN) 1 MG tablet Take 1 tablet (1 mg total) by mouth every 8 (eight) hours as needed. for anxiety  . nystatin (MYCOSTATIN) 100000 UNIT/ML suspension Use as directed 5 mLs (500,000 Units total) in the mouth or throat 4 (four) times daily. Swish and swallow  . pantoprazole (PROTONIX) 40 MG tablet Take 1 tablet (40 mg total) by mouth daily.  [name] Spacer/Aero-Holding Chambers (AEROCHAMBER PLUS WITH MASK) inhaler Use as instructed  . triamcinolone cream (KENALOG) 0.1 % Apply topically 2 (two) times daily. Apply to affected area   No facility-administered encounter medications on file as of 08/11/2018.    Physical Exam: Blood pressure 128/78, pulse 89, height 5' 5.5" (1.664 m), weight 132 lb 9.6 oz (60.1 kg), SpO2 98 %. Gen:      No acute distress HEENT:  EOMI, sclera anicteric Neck:     No masses; no thyromegaly Lungs:    Clear to auscultation bilaterally; normal respiratory effort CV:         Regular rate and rhythm; no murmurs Abd:      + bowel sounds; soft, non-tender; no palpable masses,  no distension Ext:    No edema; adequate peripheral perfusion Skin:      Warm and dry; no rash Neuro: alert and oriented x 3 Psych: normal mood and affect  Data Reviewed: Imaging CT chest 07/03/2013- postradiation changes in the right upper lobe, centrilobular emphysema, nodular thickening of the pleura at the bases CT abdomen 08/10/17- visualized lung bases show minimal atelectasis, no interstitial lung disease. Chest x-ray 04/26/2018-hyperinflation, no acute cardiopulmonary abnormality. I  reviewed the images personally.  Labs CBC 04/16/2018-WBC 8.6, eos 1.8%, absolute eosinophil count 155 2 profile 05/06/1989-IgE 271, sensitive to dust mite.  Mild allergies to cat, dog, tree pollen  Spirometry  09/13/2009 FVC 1.65 (49%], FEV1 1.39 (55%], F/F 84  08/11/2018 FVC 2.3 [77%), FEV1 2.3 [100%), F/F 97 Normal test.  FENO 08/11/2018-28  Assessment:   Emphysema Prior lung imaging reviewed with mild scarring in the right upper lobe consistent with postradiation changes and emphysema. Spirometry reviewed with no significant obstruction or evidence of COPD Symbicort did not help.  Will start her on trelegy inhaler Since she is quite symptomatic with dyspnea we will get a CT chest for further evaluation   If her symptoms continue then consider cardio pulmonary exercise test  Mild intermittent asthma RAST panel sensitivity to dust mite Start trelegy, albuterol as needed  Plan/Recommendations: - Start trelegy, Albuterol as needed - High resolution CT  Marshell Garfinkel MD [facility] 08/11/2018, 1:48 PM  CC: [name], MD

## 2018-08-11 NOTE — Telephone Encounter (Signed)
Outpatient Medication Detail    Disp Refills Start End   diltiazem (CARDIZEM) 30 MG tablet 90 tablet 3 05/05/2018    Sig: TAKE 1 TABLET BY MOUTH ONCE DAILY FOR PALPITATIONS   Sent to pharmacy as: diltiazem (CARDIZEM) 30 MG tablet   Notes to Pharmacy: **Patient requests 90 days supply**   E-Prescribing Status: Receipt confirmed by pharmacy (05/05/2018 7:31 AM EDT)   Pharmacy   [place], [place] - [address] [place]

## 2018-08-12 ENCOUNTER — Ambulatory Visit (HOSPITAL_BASED_OUTPATIENT_CLINIC_OR_DEPARTMENT_OTHER)
Admission: RE | Admit: 2018-08-12 | Discharge: 2018-08-12 | Disposition: A | Discharge status: Home / Self Care | Payer: PPO | Source: Ambulatory Visit | Attending: Pulmonary Disease | Admitting: Pulmonary Disease

## 2018-08-12 ENCOUNTER — Encounter: Payer: Self-pay | Admitting: Family Medicine

## 2018-08-12 DIAGNOSIS — I700 Atherosclerosis of aorta: Secondary | ICD-10-CM | POA: Insufficient documentation

## 2018-08-12 DIAGNOSIS — R0602 Shortness of breath: Secondary | ICD-10-CM

## 2018-08-12 DIAGNOSIS — I2510 Atherosclerotic heart disease of native coronary artery without angina pectoris: Secondary | ICD-10-CM | POA: Diagnosis not present

## 2018-08-12 DIAGNOSIS — J439 Emphysema, unspecified: Secondary | ICD-10-CM | POA: Diagnosis not present

## 2018-08-12 NOTE — Telephone Encounter (Signed)
Dr. Mannam - please advise. Thanks. 

## 2018-08-13 ENCOUNTER — Encounter: Payer: Self-pay | Admitting: Hematology

## 2018-08-13 ENCOUNTER — Encounter: Payer: Self-pay | Admitting: Family Medicine

## 2018-08-13 ENCOUNTER — Telehealth: Payer: Self-pay | Admitting: Pulmonary Disease

## 2018-08-13 ENCOUNTER — Telehealth: Payer: Self-pay

## 2018-08-13 MED ORDER — FLUTICASONE FUROATE-VILANTEROL 200-25 MCG/INH IN AEPB: 1.0000 | INHALATION_SPRAY | Freq: Every day | RESPIRATORY_TRACT | 5 refills | Status: AC

## 2018-08-13 NOTE — Telephone Encounter (Signed)
Pt. Has appointment on 10/8 with Dr. Charlett Blake per Dr. Frederik Pear request for pt. To be seen to address her concerns.

## 2018-08-13 NOTE — Telephone Encounter (Signed)
Dr Vaughan Browner,  Please see pt email and advise any recs to tell the pt, thanks:  I am so disappointed; you told me I didn't have copd after the test in your office. Why do you have questionable machines?  Again, I am so let-down.  And more bad news about my heart.  What else?  Where is my copy of the CT scan?  I should have already received it.  Please send.

## 2018-08-13 NOTE — Telephone Encounter (Signed)
Per Dr. Vaughan Browner verbally- please provide pt with sample of Breo 200. I have made pt aware via telephone that we do not currently have samples of Breo 200.  Pt requested that Rx for Breo be sent to preferred pharmacy.  Memory Dance has been sent to preferred pharmacy.  Nothing further is needed.

## 2018-08-13 NOTE — Telephone Encounter (Signed)
Dr. Vaughan Browner, please advise patient's mychart message as stated below:  Dr. Vaughan Browner,  In reviewing the test results, the new diagnosis of radiation fibrosis sounds very fitting, as I have been experiencing a lot of pain in my right breast but have accepted it as fanthom pain which often follows a mastectomy. What do we do about this condition? And about the calcification in my spleen,what do you advise regarding this new diagnosis? Does it get worse?  Please advise on recommendations for pt. Thanks!

## 2018-08-13 NOTE — Telephone Encounter (Signed)
PA intiated today through [email or web address] for Trelegy Ellipta 100 Inhaler  (Key: [facility]) PA Case ID: [id number] - Rx #: [id number]  [facility] has received your information, and the request will be reviewed. You may close this dialog, return to your dashboard, and perform other tasks. You will receive an electronic determination in CoverMyMeds. You can see the latest determination by locating this request on your dashboard or by reopening this request. You will also receive a faxed copy of the determination. If you have any questions please contact [facility] at [phone]. If you need assistance, please chat with CoverMyMeds or call us at [phone].  Routing message to [place] to f/u with PA and patient.

## 2018-08-13 NOTE — Telephone Encounter (Signed)
CT scan shows emphysema and COPD. I would like to try the trelegy inhaler to see if it helps with the breathing. We will send the COPD diagnosis to the pharmacy to see if the insurance will cover it and reduce the costs.   In addition the CT also shows calcification and plaque build up in the blood vessels of the heart. Please follow up with your heart doctor about this.  Marshell Garfinkel MD [facility] 08/13/2018, 1:09 AM

## 2018-08-13 NOTE — Telephone Encounter (Signed)
Trelegy has been switched to Breo 200.  Please see 08/13/18 patient advise request.  Nothing further is needed.

## 2018-08-14 NOTE — Telephone Encounter (Signed)
I called and discussed CT scan in detail with Mrs Tatum. 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 No intervention needed.

## 2018-08-15 ENCOUNTER — Telehealth: Payer: Self-pay | Admitting: Internal Medicine

## 2018-08-15 ENCOUNTER — Encounter: Payer: Self-pay | Admitting: Family Medicine

## 2018-08-15 NOTE — Telephone Encounter (Signed)
Spoke with patient. She sent mychart message re: calcification on coronary arteries on CT scan for dr Harrington Challenger to review and wanted to know if there are any new recommendations or if she should be seen.  Pt takes 40 mg lipitor every day.  Not sure when lipids where checked last but has appointment with PCP Tues and will discuss having them drawn then, and for Dr. Harrington Challenger to be copied in the results.    She is aware I am forwarding to Dr. Harrington Challenger to ask her to reply to MyChart message and if there are any new recommendations we will call her back.  Otherwise, will await lipid results.

## 2018-08-15 NOTE — Telephone Encounter (Signed)
° ° °  Patient calling, requesting response to MyChart message from 10/2.  Please call

## 2018-08-18 NOTE — Telephone Encounter (Signed)
Replied to patient's MyChart message to inform of Dr. Harrington Challenger' recommendations.

## 2018-08-18 NOTE — Telephone Encounter (Signed)
Aware that patient has plaquing on vessels   That is why she is on lipitor LDL was excellent 1 year ago  At 20 Continue   Should get routine lipids as scheduled

## 2018-08-19 ENCOUNTER — Ambulatory Visit (INDEPENDENT_AMBULATORY_CARE_PROVIDER_SITE_OTHER): Payer: PPO | Admitting: Family Medicine

## 2018-08-19 ENCOUNTER — Encounter: Payer: Self-pay | Admitting: Family Medicine

## 2018-08-19 VITALS — BP 100/64 | HR 88 | Temp 98.0°F | Resp 18 | Wt 134.2 lb

## 2018-08-19 DIAGNOSIS — R748 Abnormal levels of other serum enzymes: Secondary | ICD-10-CM | POA: Diagnosis not present

## 2018-08-19 DIAGNOSIS — Z23 Encounter for immunization: Secondary | ICD-10-CM | POA: Diagnosis not present

## 2018-08-19 DIAGNOSIS — R739 Hyperglycemia, unspecified: Secondary | ICD-10-CM

## 2018-08-19 DIAGNOSIS — K625 Hemorrhage of anus and rectum: Secondary | ICD-10-CM

## 2018-08-19 DIAGNOSIS — E782 Mixed hyperlipidemia: Secondary | ICD-10-CM | POA: Diagnosis not present

## 2018-08-19 DIAGNOSIS — R0600 Dyspnea, unspecified: Secondary | ICD-10-CM | POA: Diagnosis not present

## 2018-08-19 DIAGNOSIS — K5909 Other constipation: Secondary | ICD-10-CM

## 2018-08-19 DIAGNOSIS — D649 Anemia, unspecified: Secondary | ICD-10-CM

## 2018-08-19 DIAGNOSIS — R700 Elevated erythrocyte sedimentation rate: Secondary | ICD-10-CM

## 2018-08-19 DIAGNOSIS — K200 Eosinophilic esophagitis: Secondary | ICD-10-CM

## 2018-08-19 DIAGNOSIS — K219 Gastro-esophageal reflux disease without esophagitis: Secondary | ICD-10-CM

## 2018-08-19 NOTE — Patient Instructions (Signed)
Orthostatic Hypotension Orthostatic hypotension is a sudden drop in blood pressure that happens when you quickly change positions, such as when you get up from a seated or lying position. Blood pressure is a measurement of how strongly, or weakly, your blood is pressing against the walls of your arteries. Arteries are blood vessels that carry blood from your heart throughout your body. When blood pressure is too low, you may not get enough blood to your brain or to the rest of your organs. This can cause weakness, light-headedness, rapid heartbeat, and fainting. This can last for just a few seconds or for up to a few minutes. Orthostatic hypotension is usually not a serious problem. However, if it happens frequently or gets worse, it may be a sign of something more serious. What are the causes? This condition may be caused by:  Sudden changes in posture, such as standing up quickly after you have been sitting or lying down.  Blood loss.  Loss of body fluids (dehydration).  Heart problems.  Hormone (endocrine) problems.  Pregnancy.  Severe infection.  Lack of certain nutrients.  Severe allergic reactions (anaphylaxis).  Certain medicines, such as blood pressure medicine or medicines that make the body lose excess fluids (diuretics). Sometimes, this condition can be caused by not taking medicine as directed, such as taking too much of a certain medicine.  What increases the risk? Certain factors can make you more likely to develop orthostatic hypotension, including:  Age. Risk increases as you get older.  Conditions that affect the heart or the central nervous system.  Taking certain medicines, such as blood pressure medicine or diuretics.  Being pregnant.  What are the signs or symptoms? Symptoms of this condition may include:  Weakness.  Light-headedness.  Dizziness.  Blurred vision.  Fatigue.  Rapid heartbeat.  Fainting, in severe cases.  How is this  diagnosed? This condition is diagnosed based on:  Your medical history.  Your symptoms.  Your blood pressure measurement. Your health care provider will check your blood pressure when you are: ? Lying down. ? Sitting. ? Standing.  A blood pressure reading is recorded as two numbers, such as "120 over 80" (or 120/80). The first ("top") number is called the systolic pressure. It is a measure of the pressure in your arteries as your heart beats. The second ("bottom") number is called the diastolic pressure. It is a measure of the pressure in your arteries when your heart relaxes between beats. Blood pressure is measured in a unit called mm Hg. Healthy blood pressure for adults is 120/80. If your blood pressure is below 90/60, you may be diagnosed with hypotension. Other information or tests that may be used to diagnose orthostatic hypotension include:  Your other vital signs, such as your heart rate and temperature.  Blood tests.  Tilt table test. For this test, you will be safely secured to a table that moves you from a lying position to an upright position. Your heart rhythm and blood pressure will be monitored during the test.  How is this treated? Treatment for this condition may include:  Changing your diet. This may involve eating more salt (sodium) or drinking more water.  Taking medicines to raise your blood pressure.  Changing the dosage of certain medicines you are taking that might be lowering your blood pressure.  Wearing compression stockings. These stockings help to prevent blood clots and reduce swelling in your legs.  In some cases, you may need to go to the hospital for:    Fluid replacement. This means you will receive fluids through an IV tube.  Blood replacement. This means you will receive donated blood through an IV tube (transfusion).  Treating an infection or heart problems, if this applies.  Monitoring. You may need to be monitored while medicines that you  are taking wear off.  Follow these instructions at home: Eating and drinking   Drink enough fluid to keep your urine clear or pale yellow.  Eat a healthy diet and follow instructions from your health care provider about eating or drinking restrictions. A healthy diet includes: ? Fresh fruits and vegetables. ? Whole grains. ? Lean meats. ? Low-fat dairy products.  Eat extra salt only as directed. Do not add extra salt to your diet unless your health care provider told you to do that.  Eat frequent, small meals.  Avoid standing up suddenly after eating. Medicines  Take over-the-counter and prescription medicines only as told by your health care provider. ? Follow instructions from your health care provider about changing the dosage of your current medicines, if this applies. ? Do not stop or adjust any of your medicines on your own. General instructions  Wear compression stockings as told by your health care provider.  Get up slowly from lying down or sitting positions. This gives your blood pressure a chance to adjust.  Avoid hot showers and excessive heat as directed by your health care provider.  Return to your normal activities as told by your health care provider. Ask your health care provider what activities are safe for you.  Do not use any products that contain nicotine or tobacco, such as cigarettes and e-cigarettes. If you need help quitting, ask your health care provider.  Keep all follow-up visits as told by your health care provider. This is important. Contact a health care provider if:  You vomit.  You have diarrhea.  You have a fever for more than 2-3 days.  You feel more thirsty than usual.  You feel weak and tired. Get help right away if:  You have chest pain.  You have a fast or irregular heartbeat.  You develop numbness in any part of your body.  You cannot move your arms or your legs.  You have trouble speaking.  You become sweaty or feel  lightheaded.  You faint.  You feel short of breath.  You have trouble staying awake.  You feel confused. This information is not intended to replace advice given to you by your health care provider. Make sure you discuss any questions you have with your health care provider. Document Released: 10/19/2002 Document Revised: 07/17/2016 Document Reviewed: 04/20/2016 Elsevier Interactive Patient Education  2018 [employer]  

## 2018-08-19 NOTE — Assessment & Plan Note (Signed)
hgba1c acceptable, minimize simple carbs. Increase exercise as tolerated.  

## 2018-08-19 NOTE — Assessment & Plan Note (Signed)
Avoid offending foods, start probiotics. Do not eat large meals in late evening and consider raising head of bed.  

## 2018-08-19 NOTE — Assessment & Plan Note (Signed)
Repeat sed rate 

## 2018-08-19 NOTE — Assessment & Plan Note (Signed)
Increase leafy greens, consider increased lean red meat and using cast iron cookware. Continue to monitor, report any concerns 

## 2018-08-19 NOTE — Assessment & Plan Note (Signed)
Encouraged heart healthy diet, increase exercise, avoid trans fats, consider a krill oil cap daily 

## 2018-08-20 ENCOUNTER — Other Ambulatory Visit (INDEPENDENT_AMBULATORY_CARE_PROVIDER_SITE_OTHER): Payer: PPO

## 2018-08-20 DIAGNOSIS — R739 Hyperglycemia, unspecified: Secondary | ICD-10-CM | POA: Diagnosis not present

## 2018-08-21 LAB — COMPREHENSIVE METABOLIC PANEL
ALBUMIN: 4.3 g/dL (ref 3.5–5.2)
ALK PHOS: 119 U/L — AB (ref 39–117)
ALT: 15 U/L (ref 0–35)
AST: 15 U/L (ref 0–37)
BILIRUBIN TOTAL: 0.3 mg/dL (ref 0.2–1.2)
BUN: 21 mg/dL (ref 6–23)
CO2: 29 mEq/L (ref 19–32)
CREATININE: 0.77 mg/dL (ref 0.40–1.20)
Calcium: 9.5 mg/dL (ref 8.4–10.5)
Chloride: 101 mEq/L (ref 96–112)
GFR: 77.47 mL/min (ref >60.00)
GLUCOSE: 82 mg/dL (ref 70–99)
Potassium: 4.2 mEq/L (ref 3.5–5.1)
SODIUM: 139 meq/L (ref 135–145)
TOTAL PROTEIN: 7.5 g/dL (ref 6.0–8.3)

## 2018-08-21 LAB — T4, FREE: FREE T4: 0.87 ng/dL (ref 0.60–1.60)

## 2018-08-21 LAB — SEDIMENTATION RATE: Sed Rate: 50 mm/hr — ABNORMAL HIGH (ref 0–30)

## 2018-08-21 LAB — CBC
HCT: 30.8 % — ABNORMAL LOW (ref 36.0–46.0)
Hemoglobin: 10.1 g/dL — ABNORMAL LOW (ref 12.0–15.0)
MCHC: 32.7 g/dL (ref 30.0–36.0)
MCV: 84 fl (ref 78.0–100.0)
Platelets: 364 10*3/uL (ref 150.0–400.0)
RBC: 3.66 Mil/uL — ABNORMAL LOW (ref 3.87–5.11)
RDW: 14.8 % (ref 11.5–15.5)
WBC: 8.7 10*3/uL (ref 4.0–10.5)

## 2018-08-21 LAB — LIPID PANEL
CHOLESTEROL: 176 mg/dL (ref 0–200)
HDL: 75.6 mg/dL (ref >39.00)
LDL Cholesterol: 78 mg/dL (ref 0–99)
NONHDL: 100.38
Total CHOL/HDL Ratio: 2
Triglycerides: 110 mg/dL (ref 0.0–149.0)
VLDL: 22 mg/dL (ref 0.0–40.0)

## 2018-08-21 LAB — TSH: TSH: 4.06 u[IU]/mL (ref 0.35–4.50)

## 2018-08-21 LAB — HEMOGLOBIN A1C: HEMOGLOBIN A1C: 5.9 % (ref 4.6–6.5)

## 2018-08-21 NOTE — Telephone Encounter (Signed)
The nitric oxide test of 28 shows minimal inflammation in airways. We are using the inhaler for treatment of that.  Spirometry as discussed on office and on telephone looked normal but we wanted the full pulmonary function test to fully evaluate. I am happy to discuss further at time of return clinic visit this month.

## 2018-08-21 NOTE — Telephone Encounter (Signed)
Dr. Mannam - please advise. Thanks. 

## 2018-08-21 NOTE — Telephone Encounter (Signed)
Below message discussed with pt via telephone.  Pt has been scheduled for PFT on 09/09/18 at 12:00. Nothing further is needed.

## 2018-08-24 ENCOUNTER — Encounter: Payer: Self-pay | Admitting: Family Medicine

## 2018-08-24 NOTE — Progress Notes (Signed)
Subjective:    Patient ID: Anne [name], female    DOB: [birth year], 77 y.o.   MRN: [id number]  No chief complaint on file.   HPI Patient is in today for evaluation since she has not been feeling well lately. She actually feels some bette today and is somewhat nonspecific about he symptoms. She has a general sense of malaise and fatigue. She is accompanied by her husband and they endorse some inceased SOB with exertion and a feeling of discomfort or burning in her ears. Denies CP/palp/SOB/HA/congestion/fevers/GI or GU c/o. Taking meds as prescribed  Past Medical History:  Diagnosis Date  . Abdominal pain 08/11/2017  . ALKALINE PHOSPHATASE, ELEVATED 03/15/2009  . Allergic state 06/10/2012  . Anemia   . Anxiety   . Anxiety and depression 04/28/2011  . Arthritis   . Atypical chest pain 11/30/2011  . 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 Baker's cyst of knee 05/22/2011  . Cancer ([place]) 01,  08   XRT/chemo 01-02/ lobular invasive ca  . Carotid artery disease ([place])    a. Carotid duplex 03/2014: stable 1-39% BICA, f/u due 03/2016.  [name] Chronic alcoholism in remission ([place]) 03/29/2011   Did not tolerate Klonopin, caused some confusion and bad dreams.    [name] COPD (chronic obstructive pulmonary disease) ([place])   . Dermatitis 11/23/2012  . Dysphagia   . EE (eosinophilic esophagitis)   . Elevated sed rate 08/02/2013  . Esophageal ring   . ESOPHAGEAL STRICTURE 03/29/2009  . Fall 11/23/2012  . Family history of breast cancer   . Family history of colon cancer   . Family history of ovarian cancer   . Family history of pancreatic cancer   . Folliculitis of nose 4/58/0998  . GERD (gastroesophageal reflux disease) 09/29/2009   improved s/p cholecystectomy and esophagus dilatation  . Hematuria   . History of chicken pox   . History of measles   . History of shingles    2 episodes  . Hx of echocardiogram    a. Echo 01/2013: mild LVH, EF 55-60%, normal wall motion, Gr 1 diast dysfn    . Hyperlipidemia   . Knee pain, bilateral 07/23/2011  . Medicare annual wellness visit, subsequent 06/19/2015  . Mixed hyperlipidemia 10/17/2010   Qualifier: Diagnosis of  By: Mack Guise    . Orthostasis   . Osteopenia 03/14/2011  . Personal history of chemotherapy 2001  . Personal history of radiation therapy 2001   rt breast  . 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 Pneumonia 2-10   pleurisy  . PVC's (premature ventricular contractions)    a. Event monitor 01/2013: NSR, extensive PVCs.  . Radial neck fracture 10/2011   minimally displaced  . Sinusitis, acute 12/04/2015  . Trauma 10/18/2011  . URI (upper respiratory infection) 09/02/2012  . Urinary frequency 12/12/2014  . Urinary incontinence 03/19/2012  . Vaginitis 05/22/2011    Past Surgical History:  Procedure Laterality Date  . AUGMENTATION MAMMAPLASTY Right 05/27/2007  . BREAST BIOPSY Right 01/02/2007   wire loc  . BREAST BIOPSY  12/26/2006  . BREAST LUMPECTOMY Right 2001  . BREAST RECONSTRUCTION  2008, 2009, 2010  . BREAST REDUCTION WITH MASTOPEXY Left 05/30/2017   Procedure: LEFT BREAST REDUCTION FOR SYMTERY WITH MASTOPEXY;  Surgeon: Wallace Going, DO;  Location: [facility];  Service: Plastics;  Laterality: Left;  . CHOLECYSTECTOMY  2010  . COLONOSCOPY  09/05/10   cecal avm's  . DENTAL SURGERY  05/2016  4 dental implants by Dr. Loyal Gambler.  [name] ERCP  2010    CBD stone extraction   . ESOPHAGOGASTRODUODENOSCOPY  01/08/2012   Procedure: ESOPHAGOGASTRODUODENOSCOPY (EGD);  Surgeon: Gatha Mayer, MD;  Location: [facility];  Service: Endoscopy;  Laterality: N/A;  . ESOPHAGOGASTRODUODENOSCOPY (EGD) WITH ESOPHAGEAL DILATION  2010, 2012  . LAPAROSCOPIC APPENDECTOMY N/A 08/04/2016   Procedure: APPENDECTOMY LAPAROSCOPIC;  Surgeon: Michael Boston, MD;  Location: [facility];  Service: General;  Laterality: N/A;  . LIPOSUCTION WITH LIPOFILLING Left 11/21/2017   Procedure: LIPOSUCTION FROM ABDOMEN WITH LIPOFILLING TO  LEFT BREAST;  Surgeon: Wallace Going, DO;  Location: [facility];  Service: Plastics;  Laterality: Left;  [name] MASTECTOMY MODIFIED RADICAL Right 05/27/2007   , Mastectomy modified radical (08), breast reconstruction, CA lesions excised lateral abd wall 2010  . MASTOPEXY Left 11/21/2017   Procedure: LEFT BREAST REVISION MASTOPEXY FOR SYMMETRY;  Surgeon: Wallace Going, DO;  Location: [facility];  Service: Plastics;  Laterality: Left;  . REDUCTION MAMMAPLASTY Left   . SAVORY DILATION  01/08/2012   Procedure: SAVORY DILATION;  Surgeon: Gatha Mayer, MD;  Location: [facility];  Service: Endoscopy;  Laterality: N/A;  need xray    Family History  Problem Relation Age of Onset  . Heart disease Father   . Lung cancer Father        smoker  . Cirrhosis Sister        Primary Biliary  . Stroke Maternal Grandmother   . Alcohol abuse Maternal Grandfather   . Heart disease Paternal Grandfather   . Anxiety disorder Sister   . Osteoporosis Sister   . Arthritis Sister        Rheumatoid  . Osteoporosis Sister   . Skin cancer Sister        multiple skin cancers, over 38 excisions.  . Other Mother        tic [place]  . Breast cancer Maternal Aunt        dx in her 19s  . Breast cancer Paternal Aunt        dx in her 52s  . Pancreatic cancer Maternal Uncle        dx in his 14s; smoker  . Breast cancer Maternal Aunt        dx in her 69s  . Breast cancer Maternal Aunt        possible breast cancer dx and died in her 84s  . Ovarian cancer Maternal Aunt 29  . Pancreatic cancer Maternal Aunt        dx in her 82s  . Stomach cancer Maternal Uncle   . Colon cancer Maternal Uncle   . Lung cancer Paternal Aunt   . Breast cancer Cousin        paternal first cousin  . Breast cancer Cousin        maternal first cousin  . Anesthesia problems Neg Hx   . Hypotension Neg Hx   . Malignant hyperthermia Neg Hx   . Pseudochol deficiency Neg Hx     Social History     Socioeconomic History  . Marital status: Married    Spouse name: Sam  . Number of children: 3  . Years of education: Not on file  . Highest education level: Not on file  Occupational History  . Occupation: Social worker  Social Needs  . Financial resource strain: Not on file  . Food insecurity:    Worry: Not on file    Inability:  Not on file  . Transportation needs:    Medical: Not on file    Non-medical: Not on file  Tobacco Use  . Smoking status: Former Smoker    Packs/day: 1.00    Years: 50.00    Pack years: 50.00    Types: Cigarettes    Last attempt to quit: 05/22/2007    Years since quitting: 11.2  . Smokeless tobacco: Never Used  Substance and Sexual Activity  . Alcohol use: Not Currently  . Drug use: No  . Sexual activity: Yes    Partners: Male    Comment: lives with husband,   Lifestyle  . Physical activity:    Days per week: Not on file    Minutes per session: Not on file  . Stress: Not on file  Relationships  . Social connections:    Talks on phone: Not on file    Gets together: Not on file    Attends religious service: Not on file    Active member of club or organization: Not on file    Attends meetings of clubs or organizations: Not on file    Relationship status: Not on file  . Intimate partner violence:    Fear of current or ex partner: Not on file    Emotionally abused: Not on file    Physically abused: Not on file    Forced sexual activity: Not on file  Other Topics Concern  . Not on file  Social History Narrative  . Not on file    Outpatient Medications Prior to Visit  Medication Sig Dispense Refill  . acetaminophen (TYLENOL) 500 MG tablet Take 500-1,000 mg by mouth every 6 (six) hours as needed (for pain.).    [name] AMBULATORY NON FORMULARY MEDICATION Medication Name: Budesonide 2 mg/10 ml Please take 1 mg by mouth two times a day 140 mL 1  . amitriptyline (ELAVIL) 75 MG tablet Take 2 tablets (150 mg total) by mouth at bedtime. 60 tablet 5  .  aspirin EC 81 MG tablet Take 81 mg by mouth daily.    [name] atorvastatin (LIPITOR) 20 MG tablet TAKE 1 TABLET(20 MG) BY MOUTH TWICE DAILY (Patient taking differently: TAKE 2 TABLET(20 MG) BY MOUTH once DAILY) 180 tablet 3  . budesonide (PULMICORT) 0.25 MG/2ML nebulizer solution TAKE FIVE mLs BY MOUTH TWICE DAILY  1  . Cholecalciferol (VITAMIN D3) 1000 UNITS CAPS Take 2,000 Units by mouth daily.     . Cyanocobalamin (VITAMIN B 12 PO) Take 1 tablet by mouth daily.    [name] diltiazem (CARDIZEM) 30 MG tablet TAKE 1 TABLET BY MOUTH ONCE DAILY FOR PALPITATIONS 90 tablet 3  . Fluticasone-Umeclidin-Vilant (TRELEGY ELLIPTA) 100-62.5-25 MCG/INH AEPB Inhale 1 puff into the lungs daily. 60 each 0  . loratadine (CLARITIN) 10 MG tablet Take 1 tablet (10 mg total) by mouth daily. 30 tablet 11  . LORazepam (ATIVAN) 1 MG tablet Take 1 tablet (1 mg total) by mouth every 8 (eight) hours as needed. for anxiety 70 tablet 1  . pantoprazole (PROTONIX) 40 MG tablet Take 1 tablet (40 mg total) by mouth daily. 90 tablet 1  . Spacer/Aero-Holding Chambers (AEROCHAMBER PLUS WITH MASK) inhaler Use as instructed 1 each 2  . triamcinolone cream (KENALOG) 0.1 % Apply topically 2 (two) times daily. Apply to affected area 45 g 1  . nystatin (MYCOSTATIN) 100000 UNIT/ML suspension Use as directed 5 mLs (500,000 Units total) in the mouth or throat 4 (four) times daily. Swish and swallow 60 mL 0  No facility-administered medications prior to visit.     Allergies  Allergen Reactions  . Codeine Other (See Comments)    "flu like symptoms"  . Morphine Other (See Comments)    "flu like symptoms"  . Peanut-Containing Drug Products Hives  . Sorbitol Other (See Comments)    GI Issues  . Tomato Diarrhea  . Zofran Other (See Comments)    headache  . Advil [Ibuprofen] Other (See Comments)    Irritates throat.  . Diphenhydramine Hcl Other (See Comments)    "nervous and upset"  . Oxycodone Anxiety    Confusion with inability to think  clearly    Review of Systems  Constitutional: Positive for malaise/fatigue. Negative for fever.  HENT: Positive for ear pain. Negative for congestion.   Eyes: Negative for blurred vision.  Respiratory: Negative for shortness of breath.   Cardiovascular: Negative for chest pain, palpitations and leg swelling.  Gastrointestinal: Negative for abdominal pain, blood in stool and nausea.  Genitourinary: Negative for dysuria and frequency.  Musculoskeletal: Negative for falls.  Skin: Negative for rash.  Neurological: Negative for dizziness, loss of consciousness and headaches.  Endo/Heme/Allergies: Negative for environmental allergies.  Psychiatric/Behavioral: Negative for depression. The patient is not nervous/anxious.        Objective:    Physical Exam  Constitutional: She is oriented to person, place, and time. She appears well-developed and well-nourished. No distress.  HENT:  Head: Normocephalic and atraumatic.  Nose: Nose normal.  Eyes: Right eye exhibits no discharge. Left eye exhibits no discharge.  Neck: Normal range of motion. Neck supple.  Cardiovascular: Normal rate and regular rhythm. Exam reveals no friction rub.  Murmur heard. Pulmonary/Chest: Effort normal and breath sounds normal.  Abdominal: Soft. Bowel sounds are normal. There is no tenderness.  Musculoskeletal: She exhibits no edema.  Neurological: She is alert and oriented to person, place, and time.  Skin: Skin is warm and dry.  Psychiatric: She has a normal mood and affect.  Nursing note and vitals reviewed.   BP 100/64 (BP Location: Left Arm, Patient Position: Sitting, Cuff Size: Normal)   Pulse 88   Temp 98 F (36.7 C) (Oral)   Resp 18   Wt 134 lb 3.2 oz (60.9 kg)   SpO2 96%   BMI 21.99 kg/m  Wt Readings from Last 3 Encounters:  08/19/18 134 lb 3.2 oz (60.9 kg)  08/11/18 132 lb 9.6 oz (60.1 kg)  06/17/18 128 lb 3.2 oz (58.2 kg)     Lab Results  Component Value Date   WBC 8.7 08/20/2018   HGB  10.1 (L) 08/20/2018   HCT 30.8 (L) 08/20/2018   PLT 364.0 08/20/2018   GLUCOSE 82 08/20/2018   CHOL 176 08/20/2018   TRIG 110.0 08/20/2018   HDL 75.60 08/20/2018   LDLDIRECT 117.4 01/13/2013   LDLCALC 78 08/20/2018   ALT 15 08/20/2018   AST 15 08/20/2018   NA 139 08/20/2018   K 4.2 08/20/2018   CL 101 08/20/2018   CREATININE 0.77 08/20/2018   BUN 21 08/20/2018   CO2 29 08/20/2018   TSH 4.06 08/20/2018   INR 1.02 08/12/2017   HGBA1C 5.9 08/20/2018    Lab Results  Component Value Date   TSH 4.06 08/20/2018   Lab Results  Component Value Date   WBC 8.7 08/20/2018   HGB 10.1 (L) 08/20/2018   HCT 30.8 (L) 08/20/2018   MCV 84.0 08/20/2018   PLT 364.0 08/20/2018   Lab Results  Component Value Date  NA 139 08/20/2018   K 4.2 08/20/2018   CHLORIDE 104 09/04/2017   CO2 29 08/20/2018   GLUCOSE 82 08/20/2018   BUN 21 08/20/2018   CREATININE 0.77 08/20/2018   BILITOT 0.3 08/20/2018   ALKPHOS 119 (H) 08/20/2018   AST 15 08/20/2018   ALT 15 08/20/2018   PROT 7.5 08/20/2018   ALBUMIN 4.3 08/20/2018   CALCIUM 9.5 08/20/2018   ANIONGAP 11 09/04/2017   EGFR >60 09/04/2017   GFR 77.47 08/20/2018   Lab Results  Component Value Date   CHOL 176 08/20/2018   Lab Results  Component Value Date   HDL 75.60 08/20/2018   Lab Results  Component Value Date   LDLCALC 78 08/20/2018   Lab Results  Component Value Date   TRIG 110.0 08/20/2018   Lab Results  Component Value Date   CHOLHDL 2 08/20/2018   Lab Results  Component Value Date   HGBA1C 5.9 08/20/2018       Assessment & Plan:   Problem List Items Addressed This Visit    Mixed hyperlipidemia    Encouraged heart healthy diet, increase exercise, avoid trans fats, consider a krill oil cap daily      Relevant Orders   Lipid panel (Completed)   TSH (Completed)   T4, free (Completed)   Eosinophilic esophagitis    Continues to follow with GI and have intermittent symptoms.       GERD    Avoid offending  foods, start probiotics. Do not eat large meals in late evening and consider raising head of bed.       Relevant Orders   TSH (Completed)   T4, free (Completed)   Elevated sed rate    Repeat sed rate      Relevant Orders   TSH (Completed)   T4, free (Completed)   Sedimentation rate (Completed)   Anemia    Increase leafy greens, consider increased lean red meat and using cast iron cookware. Continue to monitor, report any concerns      Relevant Orders   CBC (Completed)   Dyspnea (Chronic)    Notes her sob is worsening with exertion she is encouraged to follow up with pulmonology      Constipation, chronic    Encouraged increased hydration and fiber in diet. Daily probiotics. If bowels not moving can use MOM 2 tbls po in 4 oz of warm prune juice by mouth every 2-3 days. If no results then repeat in 4 hours with  Dulcolax suppository pr, may repeat again in 4 more hours as needed. Seek care if symptoms worsen. Consider daily Miralax and/or Dulcolax if symptoms persist.       Hyperglycemia    hgba1c acceptable, minimize simple carbs. Increase exercise as tolerated.       Relevant Orders   Hemoglobin A1c (Completed)   Comprehensive metabolic panel (Completed)   TSH (Completed)   T4, free (Completed)      I have discontinued [place] R. Tarbell "Torie"'s nystatin. I am also having her maintain her Vitamin D3, Cyanocobalamin (VITAMIN B 12 PO), aspirin EC, acetaminophen, triamcinolone cream, atorvastatin, AMBULATORY NON FORMULARY MEDICATION, aerochamber plus with mask, pantoprazole, loratadine, diltiazem, amitriptyline, LORazepam, Fluticasone-Umeclidin-Vilant, and budesonide.  No orders of the defined types were placed in this encounter.    Penni Homans, MD

## 2018-08-24 NOTE — Assessment & Plan Note (Signed)
Encouraged increased hydration and fiber in diet. Daily probiotics. If bowels not moving can use MOM 2 tbls po in 4 oz of warm prune juice by mouth every 2-3 days. If no results then repeat in 4 hours with  Dulcolax suppository pr, may repeat again in 4 more hours as needed. Seek care if symptoms worsen. Consider daily Miralax and/or Dulcolax if symptoms persist.  

## 2018-08-24 NOTE — Assessment & Plan Note (Signed)
Continues to follow with GI and have intermittent symptoms.

## 2018-08-24 NOTE — Assessment & Plan Note (Addendum)
Notes her sob is worsening with exertion she is encouraged to follow up with pulmonology

## 2018-08-26 NOTE — Addendum Note (Signed)
Addended by: Magdalene Molly A on: 08/26/2018 01:39 PM   Modules accepted: Orders

## 2018-08-26 NOTE — Addendum Note (Signed)
Addended by: Magdalene Molly A on: 08/26/2018 01:42 PM   Modules accepted: Orders

## 2018-08-29 NOTE — Telephone Encounter (Signed)
Dr Vaughan Browner, please advise on pt email"   10.18.2019    Dr. Vaughan Browner:    A few days before my last visit with you, I cleaned my copper pots and pans; since I have quite a few, including copper accessories, I usually clean them all at once which takes a little over an hour. My question: Is there a connection between copper and the nitric acid?    Thanks for your time.    Anne [name]

## 2018-08-29 NOTE — Telephone Encounter (Signed)
Pt requesting a nurse to call her to discuss questions that she has had regarding labs.

## 2018-08-29 NOTE — Telephone Encounter (Signed)
I am not sure if you are referring to nitric oxide, a gas we measure in your breath. As far as I know there is no relationship to copper.

## 2018-08-31 ENCOUNTER — Other Ambulatory Visit: Payer: Self-pay | Admitting: Family Medicine

## 2018-08-31 DIAGNOSIS — S299XXA Unspecified injury of thorax, initial encounter: Secondary | ICD-10-CM

## 2018-09-01 MED ORDER — LORAZEPAM 1 MG PO TABS: 1.0000 mg | ORAL_TABLET | Freq: Three times a day (TID) | ORAL | 1 refills | Status: DC | PRN

## 2018-09-01 NOTE — Telephone Encounter (Signed)
Requesting:Ativan  Contract:yes UDS:low risk next screen 06/22/18 Last OV:08/19/18 Next OV:not scheduled  Last Refill:07/10/18  #[id number] Database:   Please advise

## 2018-09-02 ENCOUNTER — Other Ambulatory Visit (INDEPENDENT_AMBULATORY_CARE_PROVIDER_SITE_OTHER): Payer: PPO

## 2018-09-02 ENCOUNTER — Other Ambulatory Visit: Payer: PPO

## 2018-09-02 DIAGNOSIS — D649 Anemia, unspecified: Secondary | ICD-10-CM

## 2018-09-02 DIAGNOSIS — R748 Abnormal levels of other serum enzymes: Secondary | ICD-10-CM

## 2018-09-02 DIAGNOSIS — K219 Gastro-esophageal reflux disease without esophagitis: Secondary | ICD-10-CM

## 2018-09-02 LAB — COMPREHENSIVE METABOLIC PANEL
ALBUMIN: 4.1 g/dL (ref 3.5–5.2)
ALK PHOS: 113 U/L (ref 39–117)
ALT: 15 U/L (ref 0–35)
AST: 14 U/L (ref 0–37)
BILIRUBIN TOTAL: 0.4 mg/dL (ref 0.2–1.2)
BUN: 20 mg/dL (ref 6–23)
CALCIUM: 9.5 mg/dL (ref 8.4–10.5)
CO2: 32 mEq/L (ref 19–32)
CREATININE: 0.75 mg/dL (ref 0.40–1.20)
Chloride: 102 mEq/L (ref 96–112)
GFR: 79.86 mL/min (ref >60.00)
Glucose, Bld: 82 mg/dL (ref 70–99)
Potassium: 4.5 mEq/L (ref 3.5–5.1)
Sodium: 139 mEq/L (ref 135–145)
TOTAL PROTEIN: 7 g/dL (ref 6.0–8.3)

## 2018-09-02 LAB — IBC PANEL
Iron: 27 ug/dL — ABNORMAL LOW (ref 42–145)
Saturation Ratios: 5.5 % — ABNORMAL LOW (ref 20.0–50.0)
Transferrin: 348 mg/dL (ref 212.0–360.0)

## 2018-09-02 LAB — CBC WITH DIFFERENTIAL/PLATELET
BASOS PCT: 0.8 % (ref 0.0–3.0)
Basophils Absolute: 0.1 10*3/uL (ref 0.0–0.1)
EOS PCT: 2.9 % (ref 0.0–5.0)
Eosinophils Absolute: 0.2 10*3/uL (ref 0.0–0.7)
HEMATOCRIT: 30.8 % — AB (ref 36.0–46.0)
Hemoglobin: 10 g/dL — ABNORMAL LOW (ref 12.0–15.0)
Lymphocytes Relative: 29.4 % (ref 12.0–46.0)
Lymphs Abs: 2 10*3/uL (ref 0.7–4.0)
MCHC: 32.3 g/dL (ref 30.0–36.0)
MCV: 83.1 fl (ref 78.0–100.0)
MONO ABS: 0.5 10*3/uL (ref 0.1–1.0)
Monocytes Relative: 7.3 % (ref 3.0–12.0)
NEUTROS ABS: 4.1 10*3/uL (ref 1.4–7.7)
Neutrophils Relative %: 59.6 % (ref 43.0–77.0)
Platelets: 348 10*3/uL (ref 150.0–400.0)
RBC: 3.71 Mil/uL — ABNORMAL LOW (ref 3.87–5.11)
RDW: 15.5 % (ref 11.5–15.5)
WBC: 6.9 10*3/uL (ref 4.0–10.5)

## 2018-09-02 LAB — RETICULOCYTES
ABS RETIC: 54000 {cells}/uL (ref 20000–8000)
Retic Ct Pct: 1.5 %

## 2018-09-02 LAB — FERRITIN: Ferritin: 5.8 ng/mL — ABNORMAL LOW (ref 10.0–291.0)

## 2018-09-02 LAB — LACTATE DEHYDROGENASE: LDH: 166 U/L (ref 120–250)

## 2018-09-03 ENCOUNTER — Other Ambulatory Visit: Payer: Self-pay | Admitting: Family Medicine

## 2018-09-03 ENCOUNTER — Encounter: Payer: Self-pay | Admitting: Family Medicine

## 2018-09-03 DIAGNOSIS — D649 Anemia, unspecified: Secondary | ICD-10-CM

## 2018-09-03 MED ORDER — FERROUS FUMARATE-FOLIC ACID 324-1 MG PO TABS: 1.0000 | ORAL_TABLET | Freq: Every day | ORAL | 3 refills | Status: DC

## 2018-09-04 NOTE — Telephone Encounter (Signed)
Author phoned pt. Re: concerns in not hearing about her lab results. Author sees that [facility] reached out to her on 10/15 about 10/9 results, but pt. did not receive information, no documentation regarding pt. calling back. Author phoned pt. To review, but no answer. Author left generic VM asking for return call. Author also sent a [employer] giving details of results and recommendations.

## 2018-09-05 NOTE — Addendum Note (Signed)
Addended by: Raynelle Dick R on: 09/05/2018 02:09 PM   Modules accepted: Orders

## 2018-09-05 NOTE — Telephone Encounter (Signed)
Pt. Phoned author, confused about what she should be doing about the iron (see mychart message). Author instructed pt. To start the oral iron that Dr. Charlett Blake recommended, and to follow up with hematology re: starting iron infusions. Pt. was anxious about doing her pulmonary function tests while on iron. Author reassured her that it would not affect results much, and if it did, it would be beneficial if anything. Pt. confirmed that she had received the IFOB cards, and stated she did not have any questions, but then abruptly hung up. Routed to Dr. Charlett Blake and [place], [place] as [facility]ier. Hematology referral placed.

## 2018-09-06 ENCOUNTER — Encounter: Payer: Self-pay | Admitting: Family Medicine

## 2018-09-07 ENCOUNTER — Encounter: Payer: Self-pay | Admitting: Family Medicine

## 2018-09-09 ENCOUNTER — Ambulatory Visit (INDEPENDENT_AMBULATORY_CARE_PROVIDER_SITE_OTHER): Payer: PPO | Admitting: Pulmonary Disease

## 2018-09-09 ENCOUNTER — Telehealth: Payer: Self-pay | Admitting: Internal Medicine

## 2018-09-09 ENCOUNTER — Encounter: Payer: Self-pay | Admitting: Pulmonary Disease

## 2018-09-09 VITALS — BP 112/62 | HR 85 | Ht 65.0 in | Wt 134.0 lb

## 2018-09-09 DIAGNOSIS — J439 Emphysema, unspecified: Secondary | ICD-10-CM

## 2018-09-09 DIAGNOSIS — J4520 Mild intermittent asthma, uncomplicated: Secondary | ICD-10-CM | POA: Diagnosis not present

## 2018-09-09 DIAGNOSIS — R0602 Shortness of breath: Secondary | ICD-10-CM

## 2018-09-09 LAB — PULMONARY FUNCTION TEST
DL/VA % PRED: 76 %
DL/VA: 3.83 ml/min/mmHg/L
DLCO COR % PRED: 55 %
DLCO COR: 14.85 ml/min/mmHg
DLCO unc % pred: 48 %
DLCO unc: 13.02 ml/min/mmHg
FEF 25-75 PRE: 1.75 L/s
FEF 25-75 Post: 2.03 L/sec
FEF2575-%CHANGE-POST: 16 %
FEF2575-%PRED-PRE: 102 %
FEF2575-%Pred-Post: 118 %
FEV1-%Change-Post: 3 %
FEV1-%Pred-Post: 92 %
FEV1-%Pred-Pre: 89 %
FEV1-Post: 2.07 L
FEV1-Pre: 2 L
FEV1FVC-%CHANGE-POST: 2 %
FEV1FVC-%Pred-Pre: 103 %
FEV6-%CHANGE-POST: 1 %
FEV6-%Pred-Post: 91 %
FEV6-%Pred-Pre: 90 %
FEV6-Post: 2.6 L
FEV6-Pre: 2.57 L
FEV6FVC-%Change-Post: 0 %
FEV6FVC-%Pred-Post: 104 %
FEV6FVC-%Pred-Pre: 104 %
FVC-%Change-Post: 0 %
FVC-%Pred-Post: 87 %
FVC-%Pred-Pre: 86 %
FVC-Post: 2.62 L
FVC-Pre: 2.6 L
POST FEV6/FVC RATIO: 99 %
PRE FEV1/FVC RATIO: 77 %
Post FEV1/FVC ratio: 79 %
Pre FEV6/FVC Ratio: 99 %

## 2018-09-09 NOTE — Telephone Encounter (Signed)
Reviewed recent labs  Recent LDL 78    Med list says she is taking lipitor 20 mg 2x per day     I would recomm she take 40 mg once per day  At night

## 2018-09-09 NOTE — Progress Notes (Signed)
[place]  [phone] [phone]  Clinic Follow up Note   Patient Care Team: [name], MD as PCP - General (Family Medicine) Paralee Cancel, MD as Consulting Physician (Orthopedic Surgery) Fay Records, MD as Consulting Physician (Cardiology) Gatha Mayer, MD as Consulting Physician (Gastroenterology) Clent Jacks, MD as Consulting Physician (Ophthalmology) Roney Jaffe, DDS (Oral Surgery) Everlene Other (Dentistry) Haverstock, Jennefer Bravo, MD as Referring Physician (Dermatology) Dillingham, Loel Lofty, DO as Attending Physician (Plastic Surgery) Truitt Merle, MD as Consulting Physician (Hematology) 09/10/2018   ONCOLOGIC HISTORY By Dr. Toma Copier History of 2 primary lobular right breast carcinomas. 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 I believe that she had local radiation, tho that information is not available in this EMR.  Second diagnosis was Jan. 2008 (tho patient did not agree to surgery until July 2008), invasive lobular carcinoma ER 81%, PR 96%, Her 2 1+ ([id number]) , 1.5 cm invasive lobular with 3 axillary nodes negative, LVSI and perineural invasion present ([id number]). She subsequently declined adjuvant systemic treatment . She had a complicated course following right mastectomy, with difficult healing after right breast reconstruction.   She had skin recurrence lateral right chest wall excised in Sept 2010 then treated with xeloda from Oct 2010 thru Feb 2011 and began on Arimidex July 2011; she may have stopped Arimidex after bone density scan 02-2014, or possibly the year prior (?).   She has had no documented active disease since this most recent treatment, including PET  05-14-12 with no evidence of metastatic disease. Bone density scan [facility] 02-15-14 still osteopenic range, slightly lower in LS compared with 2013 and stable in hips.  CURRENT THERAPY: Surveillance  INTERVAL HISTORY:  Anne [name] Is a 77 y.o. female who is here for a follow- up on her two Lobular right breast carcinomas. Her last DEXA scan revealed T score -2.3 and last mammogram was benign. She was advised to take vitamin D and calcium for her bones. She was last seen by me 1 year ago. Today, she is here with her husband. 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 She states that her iron was found to be low, but she denies blood in stool. She states that she cannot take orange juice due to esophagitis.     REVIEW OF SYSTEMS:   Constitutional: Denies fevers, chills or abnormal weight loss Eyes: Denies blurriness of vision Ears, nose, mouth, throat, and face: Denies mucositis or sore throat  Respiratory: Denies cough, dyspnea or wheezes (+) COPD and radiation fibrosis  Cardiovascular: Denies palpitation, chest discomfort or lower extremity swelling Gastrointestinal:  Denies nausea, heartburn or change in bowel habits Skin: Denies abnormal skin rashes Lymphatics: Denies new lymphadenopathy or easy bruising Neurological:Denies numbness, tingling or new weaknesses Behavioral/Psych: Mood is stable, no new changes  MSK: negative All other systems were reviewed with the patient and are negative. Breast: (+) infrequent shooting pain in the right breast   MEDICAL HISTORY:  Past Medical History:  Diagnosis Date  . Abdominal pain 08/11/2017  . ALKALINE PHOSPHATASE, ELEVATED 03/15/2009  . Allergic state 06/10/2012  . Anemia   . Anxiety   . Anxiety and depression 04/28/2011  . Arthritis   . Atypical chest pain 11/30/2011  . AVM (  arteriovenous malformation) of colon 2011   cecum  . Baker's cyst of knee 05/22/2011  . Cancer ([place]) 01,  08   XRT/chemo 01-02/ lobular invasive ca  . Carotid  artery disease ([place])    a. Carotid duplex 03/2014: stable 1-39% BICA, f/u due 03/2016.  [name] Chronic alcoholism in remission ([place]) 03/29/2011   Did not tolerate Klonopin, caused some confusion and bad dreams.    [name] COPD (chronic obstructive pulmonary disease) ([place])   . Dermatitis 11/23/2012  . Dysphagia   . EE (eosinophilic esophagitis)   . Elevated sed rate 08/02/2013  . Esophageal ring   . ESOPHAGEAL STRICTURE 03/29/2009  . Fall 11/23/2012  . Family history of breast cancer   . Family history of colon cancer   . Family history of ovarian cancer   . Family history of pancreatic cancer   . Folliculitis of nose 07/10/9370  . GERD (gastroesophageal reflux disease) 09/29/2009   improved s/p cholecystectomy and esophagus dilatation  . Hematuria   . History of chicken pox   . History of measles   . History of shingles    2 episodes  . Hx of echocardiogram    a. Echo 01/2013: mild LVH, EF 55-60%, normal wall motion, Gr 1 diast dysfn  . Hyperlipidemia   . Knee pain, bilateral 07/23/2011  . Medicare annual wellness visit, subsequent 06/19/2015  . Mixed hyperlipidemia 10/17/2010   Qualifier: Diagnosis of  By: Mack Guise    . Orthostasis   . Osteopenia 03/14/2011  . Personal history of chemotherapy 2001  . Personal history of radiation therapy 2001   rt breast  . 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 Pneumonia 2-10   pleurisy  . PVC's (premature ventricular contractions)    a. Event monitor 01/2013: NSR, extensive PVCs.  . Radial neck fracture 10/2011   minimally displaced  . Sinusitis, acute 12/04/2015  . Trauma 10/18/2011  . URI (upper respiratory infection) 09/02/2012  . Urinary frequency 12/12/2014  . Urinary incontinence 03/19/2012  . Vaginitis 05/22/2011    SURGICAL HISTORY: Past Surgical History:  Procedure Laterality Date  . AUGMENTATION MAMMAPLASTY Right 05/27/2007  . BREAST BIOPSY Right 01/02/2007   wire loc  . BREAST BIOPSY  12/26/2006  . BREAST LUMPECTOMY Right 2001  .  BREAST RECONSTRUCTION  2008, 2009, 2010  . BREAST REDUCTION WITH MASTOPEXY Left 05/30/2017   Procedure: LEFT BREAST REDUCTION FOR SYMTERY WITH MASTOPEXY;  Surgeon: Wallace Going, DO;  Location: [facility];  Service: Plastics;  Laterality: Left;  . CHOLECYSTECTOMY  2010  . COLONOSCOPY  09/05/10   cecal avm's  . DENTAL SURGERY  05/2016   4 dental implants by Dr. Loyal Gambler.  [name] ERCP  2010    CBD stone extraction   . ESOPHAGOGASTRODUODENOSCOPY  01/08/2012   Procedure: ESOPHAGOGASTRODUODENOSCOPY (EGD);  Surgeon: Gatha Mayer, MD;  Location: [facility];  Service: Endoscopy;  Laterality: N/A;  . ESOPHAGOGASTRODUODENOSCOPY (EGD) WITH ESOPHAGEAL DILATION  2010, 2012  . LAPAROSCOPIC APPENDECTOMY N/A 08/04/2016   Procedure: APPENDECTOMY LAPAROSCOPIC;  Surgeon: Michael Boston, MD;  Location: [facility];  Service: General;  Laterality: N/A;  . LIPOSUCTION WITH LIPOFILLING Left 11/21/2017   Procedure: LIPOSUCTION FROM ABDOMEN WITH LIPOFILLING TO LEFT BREAST;  Surgeon: Wallace Going, DO;  Location: [facility];  Service: Plastics;  Laterality: Left;  [name] MASTECTOMY MODIFIED RADICAL Right 05/27/2007   , Mastectomy modified radical (08), breast reconstruction, CA lesions excised lateral abd wall 2010  . MASTOPEXY Left 11/21/2017  Procedure: LEFT BREAST REVISION MASTOPEXY FOR SYMMETRY;  Surgeon: Wallace Going, DO;  Location: [facility];  Service: Plastics;  Laterality: Left;  . REDUCTION MAMMAPLASTY Left   . SAVORY DILATION  01/08/2012   Procedure: SAVORY DILATION;  Surgeon: Gatha Mayer, MD;  Location: [facility];  Service: Endoscopy;  Laterality: N/A;  need xray    I have reviewed the social history and family history with the patient and they are unchanged from previous note.  ALLERGIES:  is allergic to codeine; morphine; peanut-containing drug products; sorbitol; tomato; zofran; advil [ibuprofen]; diphenhydramine hcl; and  oxycodone.  MEDICATIONS:  Current Outpatient Medications  Medication Sig Dispense Refill  . acetaminophen (TYLENOL) 500 MG tablet Take 500-1,000 mg by mouth every 6 (six) hours as needed (for pain.).    [name] AMBULATORY NON FORMULARY MEDICATION Medication Name: Budesonide 2 mg/10 ml Please take 1 mg by mouth two times a day 140 mL 1  . amitriptyline (ELAVIL) 75 MG tablet Take 2 tablets (150 mg total) by mouth at bedtime. 60 tablet 5  . aspirin EC 81 MG tablet Take 81 mg by mouth daily.    [name] atorvastatin (LIPITOR) 20 MG tablet TAKE 1 TABLET(20 MG) BY MOUTH TWICE DAILY (Patient taking differently: TAKE 2 TABLET(20 MG) BY MOUTH once DAILY) 180 tablet 3  . budesonide (PULMICORT) 0.25 MG/2ML nebulizer solution TAKE FIVE mLs BY MOUTH TWICE DAILY  1  . Cholecalciferol (VITAMIN D3) 1000 UNITS CAPS Take 2,000 Units by mouth daily.     . Cyanocobalamin (VITAMIN B 12 PO) Take 1 tablet by mouth daily.    [name] diltiazem (CARDIZEM) 30 MG tablet TAKE 1 TABLET BY MOUTH ONCE DAILY FOR PALPITATIONS 90 tablet 3  . Ferrous Fumarate-Folic Acid 659-9 MG TABS Take 1 tablet by mouth daily. 30 each 3  . Fluticasone-Umeclidin-Vilant (TRELEGY ELLIPTA) 100-62.5-25 MCG/INH AEPB Inhale 1 puff into the lungs daily. 60 each 0  . loratadine (CLARITIN) 10 MG tablet Take 1 tablet (10 mg total) by mouth daily. 30 tablet 11  . LORazepam (ATIVAN) 1 MG tablet Take 1 tablet (1 mg total) by mouth every 8 (eight) hours as needed. for anxiety 70 tablet 1  . pantoprazole (PROTONIX) 40 MG tablet Take 1 tablet (40 mg total) by mouth daily. 90 tablet 1  . Spacer/Aero-Holding Chambers (AEROCHAMBER PLUS WITH MASK) inhaler Use as instructed 1 each 2  . triamcinolone cream (KENALOG) 0.1 % Apply topically 2 (two) times daily. Apply to affected area 45 g 1   No current facility-administered medications for this visit.     PHYSICAL EXAMINATION: ECOG PERFORMANCE STATUS: 0 - Asymptomatic  Vitals:   09/10/18 1442  BP: 114/73  Pulse: 88  Resp: 18   Temp: 97.7 F (36.5 C)  SpO2: 100%   Filed Weights   09/10/18 1442  Weight: 137 lb 3.2 oz (62.2 kg)    GENERAL:alert, no distress and comfortable SKIN: skin color, texture, turgor are normal, no rashes or significant lesions EYES: normal, Conjunctiva are pink and non-injected, sclera clear OROPHARYNX:no exudate, no erythema and lips, buccal mucosa, and tongue normal  NECK: supple, thyroid normal size, non-tender, without nodularity LYMPH:  no palpable lymphadenopathy in the cervical, axillary or inguinal LUNGS: clear to auscultation and percussion with normal breathing effort HEART: regular rate & rhythm and no murmurs and no lower extremity edema ABDOMEN:abdomen soft, non-tender and normal bowel sounds Musculoskeletal:no cyanosis of digits and no clubbing  NEURO: alert & oriented x 3 with fluent speech, no focal motor/sensory  deficits Breasts: Breast inspection showed status post right mastectomy and reconstruction, asymmetrical, no nipple discharge. Palpation of the breasts and axilla revealed no obvious mass that I could appreciate. (+) left breast s/o breast reduction, incision has healed well, still tender (+) right ribcage is more protruded than the left side    LABORATORY DATA:  I have reviewed the data as listed CBC Latest Ref Rng & Units 09/10/2018 09/02/2018 08/20/2018  WBC 4.0 - 10.5 K/uL 8.1 6.9 8.7  Hemoglobin 12.0 - 15.0 g/dL 10.5(L) 10.0(L) 10.1(L)  Hematocrit 36.0 - 46.0 % 35.1(L) 30.8(L) 30.8(L)  Platelets 150 - 400 K/uL 314 348.0 364.0     CMP Latest Ref Rng & Units 09/10/2018 09/02/2018 08/20/2018  Glucose 70 - 99 mg/dL 55(L) 82 82  BUN 8 - 23 mg/dL 15 20 21   Creatinine 0.44 - 1.00 mg/dL 0.84 0.75 0.77  Sodium 135 - 145 mmol/L 144 139 139  Potassium 3.5 - 5.1 mmol/L 4.0 4.5 4.2  Chloride 98 - 111 mmol/L 105 102 101  CO2 22 - 32 mmol/L 31 32 29  Calcium 8.9 - 10.3 mg/dL 9.7 9.5 9.5  Total Protein 6.5 - 8.1 g/dL 8.2(H) 7.0 7.5  Total Bilirubin 0.3 - 1.2  mg/dL 0.4 0.4 0.3  Alkaline Phos 38 - 126 U/L 138(H) 113 119(H)  AST 15 - 41 U/L 20 14 15   ALT 0 - 44 U/L 18 15 15       RADIOGRAPHIC STUDIES: I have personally reviewed the radiological images as listed and agreed with the findings in the report. No results found.   06/23/2018 DEXA T score -2.3  04/02/2018 Mammogram IMPRESSION: No mammographic evidence of malignancy. A result letter of this screening mammogram will be mailed directly to the patient  Screening Mammogram 03/29/17 IMPRESSION: No mammographic evidence of malignancy. A result letter of this screening mammogram will be mailed directly to the patient. RECOMMENDATION: Screening mammogram in one year.   ASSESSMENT & PLAN:  Anne [name] is a 77 y.o. caucasian female with a history of ostepenia, Chronic Urology problems, two lobular right breast carcinomas in 2001 and 2008.   1. History of recurrent right breast lobular carcinoma, in 2001, 2008 and chest wall recurrence in 2010 -I reviewed her medical records extensively, and comfortable and keep findings with patient -She is clinically doing well, lab reviewed, exam was unremarkable, last mammogram in May 2017 was normal. No clinical concern of recurrence. -She is very unhappy about her cosmetic results of her construction, especially the asymmetry. She was last seen by plastic surgeon Dr. Marla Roe. According to Dr. Eusebio Friendly note, she is at high risk of complications if she redo reconstruction of right side, so she recommended left breast reduction. Pt did not seem to understand and remember that, and I encouraged her to contact Dr. Eusebio Friendly office again if she is interested in left breast reconstruction. However, she would like me to get a second opinion regarding reconstruction.  -Will continue breast cancer surveillance. She is scheduled for annual mammogram next week. I encouraged her to do self exam, and a follow-up with [place] routinely. -Patient underwent  breast reduction 05/30/17 and will have another by Dr. Marla Roe in 09/2017 -She is clinically doing well. Her CBC showed Hg 10.5 CMP is pending. Her Breast exam was unremarkable and her 03/2018 mammogram was normal. There are no concerns for recurrence.  -Her last recurrence was in 2010, I think she is good to follow up once a year with annual mammogram. She knows to contact  use with any concerning symptoms.  -f/u in one year  2. Osteopenia  -She had a bone density scan in 03/2016 so she will due for another one in 2019 -She'll continue calcium and vitamin D supplement  3. Anemia of iron deficiency  -she has developed new anemia since 3 weeks ago with lab evidence of iron deficiency. -Her prior colonoscopy in 2014 showed AVM in her Cecum, which may contribute to her anemia. I recommend her to repeat colonoscopy  -Will continue to monitor with labs with PCP. She is currently on oral iron. -slightly improved today with Hb 10.5.  -we discussed indication for iv iron, she knows to call me if she has concerns    4. history of alcohol and tobacco abuse 5. History of chronic urological problems, follow-up with urology 6. History of esophageal dilatation 7. Anxiety and depression  PLAN:  -Mammogram in 03/2019 -Lab and f/u in one year  -she will f/u with PCP for her anemia and workup, I will send a message to her GI Dr. [name] for colonoscopy    No orders of the defined types were placed in this encounter.  All questions were answered. The patient knows to call the clinic with any problems, questions or concerns. No barriers to learning was detected.  I spent 20 minutes counseling the patient face to face. The total time spent in the appointment was 25 minutes and more than 50% was on counseling and review of test results  I, Noor Dweik am acting as scribe for Dr. Truitt Merle.  I have reviewed the above documentation for accuracy and completeness, and I agree with the above.      Truitt Merle,  MD 09/10/2018

## 2018-09-09 NOTE — Progress Notes (Signed)
Anne [name]    [id number]    08-26-1941  Primary Care Physician:[name], [name], MD  Referring Physician: [name], MD [address] [place], [place] [address]  Chief complaint: Follow-up for emphysema, COPD   HPI: 77 year old with history of breast cancer, emphysema, eosinophilic esophagitis, palpitations, hyperlipidemia Complains of progressive dyspnea on exertion for the past 2 years.  She does not have symptoms at rest.  Denies any cough, sputum production, wheezing.  She is on albuterol which she is using a couple of times per week.  She has significant seasonal allergies, notes episodes of increasing dyspnea, tightness after working in her yard.  She also has acid reflux, eosinophilic esophagitis and follows with Dr. [name].  She is on budesonide, PPI therapy for this. Started on Symbicort at last visit in June 2018.  She stopped using it after few weeks as it made her hoarse.    Pets: No pets Occupation: Used to work in administration for a [employer] Exposures: Has some dampness in the crawlspace with possible mold exposure. Smoking history: 50-70-pack-year smoking history.  Quit in 2008. Travel history: No significant travel.  Interim history: Complains of significant dyspnea on exertion, no symptoms at rest.  No cough, sputum production, wheezing She is had a work-up including high-resolution CT, pulmonary function tests and is here for review  She was prescribed trelegy inhaler but she stopped after a few days as it was not making any difference.  Started on iron therapy for iron deficiency anemia.  Outpatient Encounter Medications as of 09/09/2018  Medication Sig  . acetaminophen (TYLENOL) 500 MG tablet Take 500-1,000 mg by mouth every 6 (six) hours as needed (for pain.).  [name] AMBULATORY NON FORMULARY MEDICATION Medication Name: Budesonide 2 mg/10 ml Please take 1 mg by mouth two times a day  . amitriptyline (ELAVIL) 75 MG  tablet Take 2 tablets (150 mg total) by mouth at bedtime.  [name] aspirin EC 81 MG tablet Take 81 mg by mouth daily.  [name] atorvastatin (LIPITOR) 20 MG tablet TAKE 1 TABLET(20 MG) BY MOUTH TWICE DAILY (Patient taking differently: TAKE 2 TABLET(20 MG) BY MOUTH once DAILY)  . budesonide (PULMICORT) 0.25 MG/2ML nebulizer solution TAKE FIVE mLs BY MOUTH TWICE DAILY  . Cholecalciferol (VITAMIN D3) 1000 UNITS CAPS Take 2,000 Units by mouth daily.   . Cyanocobalamin (VITAMIN B 12 PO) Take 1 tablet by mouth daily.  [name] diltiazem (CARDIZEM) 30 MG tablet TAKE 1 TABLET BY MOUTH ONCE DAILY FOR PALPITATIONS  . Ferrous Fumarate-Folic Acid 858-8 MG TABS Take 1 tablet by mouth daily.  [name] loratadine (CLARITIN) 10 MG tablet Take 1 tablet (10 mg total) by mouth daily.  [name] LORazepam (ATIVAN) 1 MG tablet Take 1 tablet (1 mg total) by mouth every 8 (eight) hours as needed. for anxiety  . pantoprazole (PROTONIX) 40 MG tablet Take 1 tablet (40 mg total) by mouth daily.  [name] Spacer/Aero-Holding Chambers (AEROCHAMBER PLUS WITH MASK) inhaler Use as instructed  . triamcinolone cream (KENALOG) 0.1 % Apply topically 2 (two) times daily. Apply to affected area  . Fluticasone-Umeclidin-Vilant (TRELEGY ELLIPTA) 100-62.5-25 MCG/INH AEPB Inhale 1 puff into the lungs daily. (Patient not taking: Reported on 09/09/2018)   No facility-administered encounter medications on file as of 09/09/2018.    Physical Exam: Blood pressure 128/78, pulse 89, height 5' 5.5" (1.664 m), weight 132 lb 9.6 oz (60.1 kg), SpO2 98 %. Gen:      No  acute distress HEENT:  EOMI, sclera anicteric Neck:     No masses; no thyromegaly Lungs:    Clear to auscultation bilaterally; normal respiratory effort CV:         Regular rate and rhythm; no murmurs Abd:      + bowel sounds; soft, non-tender; no palpable masses, no distension Ext:    No edema; adequate peripheral perfusion Skin:      Warm and dry; no rash Neuro: alert and oriented x 3 Psych: normal mood and  affect  Data Reviewed: Imaging CT chest 07/03/2013- postradiation changes in the right upper lobe, centrilobular emphysema, nodular thickening of the pleura at the bases CT abdomen 08/10/17- visualized lung bases show minimal atelectasis, no interstitial lung disease. CT high-resolution 08/12/2018-moderate emphysema, mild radiation fibrosis in the apical right upper lobe, two-vessel coronary atherosclerosis. I reviewed the images personally.  Labs CBC 04/16/2018-WBC 8.6, eos 1.8%, absolute eosinophil count 155 Allergy profile 05/06/1989-IgE 271, sensitive to dust mite.  Mild allergies to cat, dog, tree pollen  PFTs Spirometry  09/13/2009 FVC 1.65 (49%], FEV1 1.39 (55%], F/F 84  08/11/2018 FVC 2.3 [77%), FEV1 2.3 [100%), F/F 97 Normal test.  PFTs 09/09/2018 FVC 2.62 [1%), FEV1 2.07 [92%), F/F 79, TLC 56%, DLCO 48%, DLCO/VA 76%  FENO 08/11/2018-28  Assessment:  Emphysema, dyspnea Lung imaging reviewed with minimal scarring in the right upper lobe consistent with postradiation changes and moderate emphysema.  No evidence of interstitial lung disease. There are minimal obstructive changes on pulmonary function tests Continue trelegy.  Emphasized that she needs to take this every day for at least 2 to 4 weeks  Suspect there is a component of deconditioning contributing to her symptoms Encouraged her to start an exercise program at home and stay active. We will reassess in 3 months.  If her symptoms continue then consider cardio pulmonary exercise test  Plan/Recommendations: - Continue trelegy, Albuterol as needed - Start exercise therapy  Marshell Garfinkel MD [facility] 09/09/2018, 2:19 PM  CC: [name], MD

## 2018-09-09 NOTE — Progress Notes (Signed)
PFT done today. 

## 2018-09-09 NOTE — Patient Instructions (Signed)
I have reviewed the PFTs which show minimal COPD changes There is moderate emphysema on the CT scan with small area of radiation fibrosis. I suspect that your shortness of breath is multifactorial from low iron levels, emphysema, deconditioning  Start using the trelegy every day.  Give it a trial for at least 2 to 4 weeks Continue on therapy as recommended by primary Start on an exercise program at home to get your activity levels up  I will follow back with you in 3 months.

## 2018-09-10 ENCOUNTER — Inpatient Hospital Stay: Payer: PPO

## 2018-09-10 ENCOUNTER — Inpatient Hospital Stay: Payer: PPO | Attending: Hematology | Admitting: Hematology

## 2018-09-10 ENCOUNTER — Telehealth: Payer: Self-pay

## 2018-09-10 ENCOUNTER — Encounter: Payer: Self-pay | Admitting: Hematology

## 2018-09-10 VITALS — BP 114/73 | HR 88 | Temp 97.7°F | Resp 18 | Ht 65.0 in | Wt 137.2 lb

## 2018-09-10 DIAGNOSIS — J449 Chronic obstructive pulmonary disease, unspecified: Secondary | ICD-10-CM | POA: Diagnosis not present

## 2018-09-10 DIAGNOSIS — Z7982 Long term (current) use of aspirin: Secondary | ICD-10-CM | POA: Diagnosis not present

## 2018-09-10 DIAGNOSIS — D509 Iron deficiency anemia, unspecified: Secondary | ICD-10-CM | POA: Diagnosis not present

## 2018-09-10 DIAGNOSIS — Z853 Personal history of malignant neoplasm of breast: Secondary | ICD-10-CM | POA: Insufficient documentation

## 2018-09-10 DIAGNOSIS — Z170 Estrogen receptor positive status [ER+]: Secondary | ICD-10-CM

## 2018-09-10 DIAGNOSIS — Z79899 Other long term (current) drug therapy: Secondary | ICD-10-CM | POA: Insufficient documentation

## 2018-09-10 DIAGNOSIS — Z923 Personal history of irradiation: Secondary | ICD-10-CM

## 2018-09-10 DIAGNOSIS — Z421 Encounter for breast reconstruction following mastectomy: Secondary | ICD-10-CM

## 2018-09-10 LAB — CBC WITH DIFFERENTIAL/PLATELET
ABS IMMATURE GRANULOCYTES: 0.02 10*3/uL (ref 0.00–0.07)
BASOS ABS: 0.1 10*3/uL (ref 0.0–0.1)
Basophils Relative: 1 %
EOS ABS: 0.2 10*3/uL (ref 0.0–0.5)
Eosinophils Relative: 3 %
HCT: 35.1 % — ABNORMAL LOW (ref 36.0–46.0)
HEMOGLOBIN: 10.5 g/dL — AB (ref 12.0–15.0)
IMMATURE GRANULOCYTES: 0 %
LYMPHS ABS: 1.7 10*3/uL (ref 0.7–4.0)
LYMPHS PCT: 21 %
MCH: 26.3 pg (ref 26.0–34.0)
MCHC: 29.9 g/dL — ABNORMAL LOW (ref 30.0–36.0)
MCV: 87.8 fL (ref 80.0–100.0)
MONOS PCT: 7 %
Monocytes Absolute: 0.5 10*3/uL (ref 0.1–1.0)
NEUTROS PCT: 68 %
NRBC: 0 % (ref 0.0–0.2)
Neutro Abs: 5.6 10*3/uL (ref 1.7–7.7)
Platelets: 314 10*3/uL (ref 150–400)
RBC: 4 MIL/uL (ref 3.87–5.11)
RDW: 14.9 % (ref 11.5–15.5)
WBC: 8.1 10*3/uL (ref 4.0–10.5)

## 2018-09-10 LAB — COMPREHENSIVE METABOLIC PANEL
ALBUMIN: 4 g/dL (ref 3.5–5.0)
ALT: 18 U/L (ref 0–44)
ANION GAP: 8 (ref 5–15)
AST: 20 U/L (ref 15–41)
Alkaline Phosphatase: 138 U/L — ABNORMAL HIGH (ref 38–126)
BUN: 15 mg/dL (ref 8–23)
CHLORIDE: 105 mmol/L (ref 98–111)
CO2: 31 mmol/L (ref 22–32)
CREATININE: 0.84 mg/dL (ref 0.44–1.00)
Calcium: 9.7 mg/dL (ref 8.9–10.3)
GFR calc Af Amer: >60 mL/min (ref >60)
GFR calc non Af Amer: >60 mL/min (ref >60)
GLUCOSE: 55 mg/dL — AB (ref 70–99)
Potassium: 4 mmol/L (ref 3.5–5.1)
SODIUM: 144 mmol/L (ref 135–145)
Total Bilirubin: 0.4 mg/dL (ref 0.3–1.2)
Total Protein: 8.2 g/dL — ABNORMAL HIGH (ref 6.5–8.1)

## 2018-09-10 NOTE — Telephone Encounter (Signed)
Left message for patient to call back  

## 2018-09-10 NOTE — Telephone Encounter (Signed)
Yes. I am ok ordering the test for evaluation of dyspnea.    To clarify it is a separate appointment and will be done at [facility] hospital and not at the time of the clinic appointment.  We will give a call with the results after the test.   Thanks

## 2018-09-10 NOTE — Telephone Encounter (Signed)
Printed avs and calender of upcoming appointment. Per 10/30 los 

## 2018-09-10 NOTE — Telephone Encounter (Signed)
Dr. Vaughan Browner - please advise if you are okay with ordering this test. Thanks.

## 2018-09-11 ENCOUNTER — Telehealth: Payer: Self-pay

## 2018-09-11 NOTE — Telephone Encounter (Signed)
-----   Message from Truitt Merle, MD sent at 09/11/2018  7:09 AM EDT ----- [name], please let pt know that I have spoken with her GI Dr. [name] and let her call his office for appointment  Thanks  Krista Blue  ----- Message ----- From: Gatha Mayer, MD Sent: 09/11/2018   6:07 AM EDT To: Truitt Merle, MD  I think reasonable to see her - last colonoscopy was 2011 actually.  Would be great if you tell her to call and make an appointment.  Have had a hard time with her keeping follow-up and she seems to have been dissatisfied with me in some respects.  [name] ----- Message ----- From: Truitt Merle, MD Sent: 09/10/2018   5:41 PM EDT To: Gatha Mayer, MD, [name], MD  [name],  She has developed iron deficient anemia since 3 weeks ago, no overt GI bleeding, on oral iron. Her last colonoscopy in 2014 showed AVM in cecum, maybe she needs to repeat colonoscopy. Do you want to see her back?  Thanks   Krista Blue

## 2018-09-11 NOTE — Telephone Encounter (Signed)
Left voice message for patient per Dr. Burr Medico has spoken with Dr. Celesta Aver office, instructed patient to call for an appointment.  The number is [phone]

## 2018-09-11 NOTE — Addendum Note (Signed)
Addended by: Valerie Salts on: 09/11/2018 09:12 AM   Modules accepted: Orders

## 2018-09-16 ENCOUNTER — Encounter: Payer: Self-pay | Admitting: Family Medicine

## 2018-09-16 NOTE — Telephone Encounter (Signed)
Dr. Vaughan Browner please advise on below message from patient. Thank you!   "----- Message -----  From: Gwenevere Ghazi  Sent: 09/15/2018 11:15 PM EST  To: Marshell Garfinkel, MD Subject: Visit Follow-Up Question  10.4.2019  Dr. 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   Thank you for your time.  Torie Vanderpol"

## 2018-09-17 ENCOUNTER — Ambulatory Visit: Payer: Self-pay | Admitting: *Deleted

## 2018-09-17 ENCOUNTER — Encounter: Payer: Self-pay | Admitting: Family Medicine

## 2018-09-17 ENCOUNTER — Encounter (HOSPITAL_COMMUNITY): Payer: PPO

## 2018-09-17 ENCOUNTER — Telehealth: Payer: Self-pay | Admitting: Internal Medicine

## 2018-09-17 NOTE — Telephone Encounter (Signed)
Pt called just to inform Dr. [name] that she will have a stool test with her PCP first and if results are positive she will call us to make an appt.

## 2018-09-17 NOTE — Telephone Encounter (Signed)
FYI

## 2018-09-17 NOTE — Telephone Encounter (Signed)
Was offline all day finally got back on line. Sent several my chart messages to patient

## 2018-09-17 NOTE — Telephone Encounter (Signed)
Patient has a lot of frustration at her lab results- she feels that no one cares about her. Patient has multiple concerns about her health and lab results that are worrying her and she feels that no one is reviewing her results and addressing her illnesses. Offered to make patient an appointment to sit down and  review her results- but she doesn't feel she needs to come in for this when Dr Charlett Blake can see them just as well as she can.  Patient is very agitated and upset. She states her blood was drawn in the afternoon and her glucose was 55 and no one cares. She had a kidney test that indicates her kidney function is failing and no one cares. She is weak and frustrated about her iron levels and her inability to tolerate the medication prescribed. Patient wants Dr Charlett Blake to call her regarding her MyChart messages and her call today. She can be reached on her cell phone.  Reason for Disposition . [1] Caller requesting NON-URGENT health information AND [2] PCP's office is the best resource  Answer Assessment - Initial Assessment Questions 1. REASON FOR CALL or QUESTION: "What is your reason for calling today?" or "How can I best help you?" or "What question do you have that I can help answer?"     Patient is calling to discuss her frustration with her health concerns and the responses she has gotten with her lab results. See note and MyChart messages. Appointment offered- patient declined  Protocols used: INFORMATION ONLY CALL-A-AH

## 2018-09-18 NOTE — Telephone Encounter (Signed)
MyChart messages answered several times through other messages.

## 2018-09-22 DIAGNOSIS — D508 Other iron deficiency anemias: Secondary | ICD-10-CM | POA: Diagnosis not present

## 2018-09-22 DIAGNOSIS — H353211 Exudative age-related macular degeneration, right eye, with active choroidal neovascularization: Secondary | ICD-10-CM | POA: Diagnosis not present

## 2018-09-22 DIAGNOSIS — H353131 Nonexudative age-related macular degeneration, bilateral, early dry stage: Secondary | ICD-10-CM | POA: Diagnosis not present

## 2018-09-29 NOTE — Telephone Encounter (Signed)
Dr. Vaughan Browner, can you review PFT results and let us know if it is ok to send in a Rx for albuterol rescue inhaler.

## 2018-09-30 NOTE — Telephone Encounter (Signed)
As we discussed in office the PFTs show minimal obstructive changes There is moderate emphysema on the CT scan with small area of radiation fibrosis. The prognosis is fair as the changes overall appear small. We can discuss further at time of office.  I am ok to order the albuterol inhaler as needed for rescue medications.  I see that you did not keep the appointment for the cardiopulmonary exercise test that was requested. We can reorder it if you wish. Please let us know.  Marshell Garfinkel MD [facility] 09/30/2018, 9:09 AM

## 2018-10-03 MED ORDER — ATORVASTATIN CALCIUM 20 MG PO TABS: 40.0000 mg | ORAL_TABLET | Freq: Every day | ORAL | 3 refills | Status: DC

## 2018-10-03 NOTE — Telephone Encounter (Signed)
Called patient back again, asked her to call back to clarify one of her medications.

## 2018-10-03 NOTE — Addendum Note (Signed)
Addended by: Rodman Key on: 10/03/2018 03:46 PM   Modules accepted: Orders

## 2018-10-03 NOTE — Telephone Encounter (Signed)
Reviewed with patient. She already takes 40 mg (two 20mg  tabs) once a day in the evening.  She will continue this dose.

## 2018-10-06 ENCOUNTER — Telehealth: Payer: Self-pay | Admitting: Family Medicine

## 2018-10-06 ENCOUNTER — Other Ambulatory Visit: Payer: Self-pay | Admitting: Family Medicine

## 2018-10-06 ENCOUNTER — Telehealth: Payer: Self-pay | Admitting: Hematology

## 2018-10-06 ENCOUNTER — Other Ambulatory Visit (INDEPENDENT_AMBULATORY_CARE_PROVIDER_SITE_OTHER): Payer: PPO

## 2018-10-06 ENCOUNTER — Other Ambulatory Visit: Payer: Self-pay | Admitting: Hematology

## 2018-10-06 DIAGNOSIS — D649 Anemia, unspecified: Secondary | ICD-10-CM

## 2018-10-06 DIAGNOSIS — K625 Hemorrhage of anus and rectum: Secondary | ICD-10-CM | POA: Diagnosis not present

## 2018-10-06 DIAGNOSIS — R195 Other fecal abnormalities: Secondary | ICD-10-CM

## 2018-10-06 LAB — FECAL OCCULT BLOOD, IMMUNOCHEMICAL: Fecal Occult Bld: POSITIVE — AB

## 2018-10-06 NOTE — Telephone Encounter (Signed)
lmom to inform pt of cancelled new patient appt 11/26 per staff message. Pt is established with Dr Burr Medico at the [facility]. Forwarded referral to that location

## 2018-10-06 NOTE — Telephone Encounter (Signed)
Thanks will follow

## 2018-10-06 NOTE — Telephone Encounter (Signed)
Emailed pt back advising that Dr. Vaughan Browner will review her concerns. Pt has  questions are in regard to the Breo Ellipta 200/25 Inhaler, its interaction with 150 mg of amitryptline, which she take nightly She has gained 12 pounds since regularly using my inhaler. I have forward the orginial message to Dr. Vaughan Browner for review this morning  Dr. Vaughan Browner please advise.

## 2018-10-06 NOTE — Telephone Encounter (Signed)
Copied from [place] [phone]. Topic: Quick Communication - See Telephone Encounter >> Oct 06, 2018  2:38 PM Burchel, Abbi R wrote: CRM for notification. See Telephone encounter for: 10/06/18.  Pt requesting call back from Dr Frederik Pear nurse re: cancelled new pt appts   Pt: [phone]

## 2018-10-06 NOTE — Telephone Encounter (Signed)
Author phoned oncology office to clarify why Dr. Maylon Peppers had appointment cancelled. Per nurse, pt. is already established with Dr. Burr Medico at [facility], so that is why appointment was cancelled. Pt. last seen by Dr. Burr Medico 10/30. Author phoned pt. to review, and to review recent IFOB results as well. Pt. had a difficult time understanding why her appointment was cancelled despite repeated attempts to explain. Pt. stated she would like to transfer care to Dr. Maylon Peppers then, stating "I need this labwork done! I need my iron checked!". Author reassured pt. that when she sees gastroenterologist tmr. at 930AM, they can order necessary labwork, as we wait on approval to transfer care and pt. was not convinced. Chief Strategy Officer stated she would route message to [facility], Dr. Burr Medico and Dr. Maylon Peppers to make aware and get approval for transfer of care. Pt. hung up on author. Routed to Dr. Charlett Blake and [place], [place].

## 2018-10-06 NOTE — Telephone Encounter (Signed)
I have called pt and explained to her what her appointment with Dr. Maylon Peppers was cancelled today. She voiced understand and appreciate the call. I have requested to repeat her lab at our cancer center in a month and I will see her in 2 months. Our scheduler will call her in a few days.   Truitt Merle MD

## 2018-10-07 ENCOUNTER — Ambulatory Visit (INDEPENDENT_AMBULATORY_CARE_PROVIDER_SITE_OTHER): Payer: PPO | Admitting: Nurse Practitioner

## 2018-10-07 ENCOUNTER — Other Ambulatory Visit: Payer: PPO

## 2018-10-07 ENCOUNTER — Encounter: Payer: Self-pay | Admitting: Nurse Practitioner

## 2018-10-07 ENCOUNTER — Ambulatory Visit: Payer: PPO | Admitting: Hematology

## 2018-10-07 ENCOUNTER — Telehealth: Payer: Self-pay | Admitting: Hematology

## 2018-10-07 ENCOUNTER — Other Ambulatory Visit (INDEPENDENT_AMBULATORY_CARE_PROVIDER_SITE_OTHER): Payer: PPO

## 2018-10-07 VITALS — BP 100/60 | HR 85 | Ht 65.5 in | Wt 138.0 lb

## 2018-10-07 DIAGNOSIS — D509 Iron deficiency anemia, unspecified: Secondary | ICD-10-CM | POA: Diagnosis not present

## 2018-10-07 DIAGNOSIS — R195 Other fecal abnormalities: Secondary | ICD-10-CM | POA: Diagnosis not present

## 2018-10-07 LAB — CBC
HEMATOCRIT: 35.1 % — AB (ref 36.0–46.0)
Hemoglobin: 11.3 g/dL — ABNORMAL LOW (ref 12.0–15.0)
MCHC: 32.3 g/dL (ref 30.0–36.0)
MCV: 87.6 fl (ref 78.0–100.0)
Platelets: 275 10*3/uL (ref 150.0–400.0)
RBC: 4 Mil/uL (ref 3.87–5.11)
RDW: 21.8 % — AB (ref 11.5–15.5)
WBC: 7.6 10*3/uL (ref 4.0–10.5)

## 2018-10-07 MED ORDER — NA SULFATE-K SULFATE-MG SULF 17.5-3.13-1.6 GM/177ML PO SOLN: ORAL | 0 refills | Status: DC

## 2018-10-07 NOTE — Patient Instructions (Addendum)
If you are age 76 or older, your body mass index should be between 23-30. Your Body mass index is 22.62 kg/m. If this is out of the aforementioned range listed, please consider follow up with your Primary Care Provider.  If you are age 26 or younger, your body mass index should be between 19-25. Your Body mass index is 22.62 kg/m. If this is out of the aformentioned range listed, please consider follow up with your Primary Care Provider.   You have been scheduled for a colonoscopy. Please follow written instructions given to you at your visit today.  Please pick up your prep supplies at the pharmacy within the next 1-3 days. If you use inhalers (even only as needed), please bring them with you on the day of your procedure. Your physician has requested that you go to www.startemmi.com and enter the access code given to you at your visit today. This web site gives a general overview about your procedure. However, you should still follow specific instructions given to you by our office regarding your preparation for the procedure.  We have sent the following medications to your pharmacy for you to pick up at your convenience: [facility] provider has requested that you go to the basement level for lab work before leaving today. Press "B" on the elevator. The lab is located at the first door on the left as you exit the elevator. CBC HOLD IRON FIVE DAYS PRIOR TO PROCEDURE.  Thank you for choosing me and [facility].   Tye Savoy, NP

## 2018-10-07 NOTE — Progress Notes (Signed)
Agree with assessment and plan as outlined.  

## 2018-10-07 NOTE — Telephone Encounter (Signed)
Scheduled appt per 11/25 sch message - left message for patient with apt date and time and sent reminder letter in the mail with appt date and time

## 2018-10-07 NOTE — Progress Notes (Deleted)
Chief Complaint:      IMPRESSION and PLAN:         HPI:     Patient is a ***    Review of systems:     No chest pain, no SOB, no fevers, no urinary sx   Past Medical History:  Diagnosis Date  . Abdominal pain 08/11/2017  . ALKALINE PHOSPHATASE, ELEVATED 03/15/2009  . Allergic state 06/10/2012  . Anemia   . Anxiety   . Anxiety and depression 04/28/2011  . Arthritis   . Atypical chest pain 11/30/2011  . 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 Baker's cyst of knee 05/22/2011  . 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 Carotid artery disease ([place])    a. Carotid duplex 03/2014: stable 1-39% BICA, f/u due 03/2016.  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    [name] COPD (chronic obstructive pulmonary disease) ([place])   . Dermatitis 11/23/2012  . Dysphagia   . EE (eosinophilic esophagitis)   . Elevated sed rate 08/02/2013  . Esophageal ring   . ESOPHAGEAL STRICTURE 03/29/2009  . Fall 11/23/2012  . Family history of breast cancer   . Family history of colon cancer   . Family history of ovarian cancer   . Family history of pancreatic cancer   . Folliculitis of nose 3/66/4403  . GERD (gastroesophageal reflux disease) 09/29/2009   improved s/p cholecystectomy and esophagus dilatation  . Hematuria   . History of chicken pox   . History of measles   . History of shingles    2 episodes  . Hx of echocardiogram    a. Echo 01/2013: mild LVH, EF 55-60%, normal wall motion, Gr 1 diast dysfn  . Hyperlipidemia   . Knee pain, bilateral 07/23/2011  . Medicare annual wellness visit, subsequent 06/19/2015  . Mixed hyperlipidemia 10/17/2010   Qualifier: Diagnosis of  By: Mack Guise    . Orthostasis   . Osteopenia 03/14/2011  . Personal history of chemotherapy 2001  . Personal history of radiation therapy 2001   rt breast  . 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  Pneumonia 2-10   pleurisy  . PVC's (premature ventricular contractions)    a. Event monitor 01/2013: NSR, extensive PVCs.  . Radial neck fracture 10/2011   minimally displaced  . Sinusitis, acute 12/04/2015  . Trauma 10/18/2011  . URI (upper respiratory infection) 09/02/2012  . Urinary frequency 12/12/2014  . Urinary incontinence 03/19/2012  . Vaginitis 05/22/2011    Patient's surgical history, family medical history, social history, medications and allergies were all reviewed in Epic   Creatinine clearance cannot be calculated (Patient's most recent lab result is older than the maximum 21 days allowed.)  Current Outpatient Medications  Medication Sig Dispense Refill  . acetaminophen (TYLENOL) 500 MG tablet Take 500-1,000 mg by mouth every 6 (six) hours as needed (for pain.).    [name] AMBULATORY NON FORMULARY MEDICATION Medication Name: Budesonide 2 mg/10 ml Please take 1 mg by mouth two times a day 140 mL 1  . amitriptyline (ELAVIL) 75 MG tablet Take 2 tablets (150 mg total) by mouth at bedtime. 60 tablet 5  . aspirin EC 81 MG tablet Take 81 mg by mouth daily.    [name] atorvastatin (LIPITOR) 20 MG tablet Take 2 tablets (40 mg total) by mouth daily at 6 PM. 180  tablet 3  . budesonide (PULMICORT) 0.25 MG/2ML nebulizer solution TAKE FIVE mLs BY MOUTH TWICE DAILY  1  . Cholecalciferol (VITAMIN D3) 1000 UNITS CAPS Take 2,000 Units by mouth daily.     . Cyanocobalamin (VITAMIN B 12 PO) Take 1 tablet by mouth daily.    [name] diltiazem (CARDIZEM) 30 MG tablet TAKE 1 TABLET BY MOUTH ONCE DAILY FOR PALPITATIONS 90 tablet 3  . Ferrous Fumarate-Folic Acid 509-3 MG TABS Take 1 tablet by mouth daily. 30 each 3  . fluticasone furoate-vilanterol (BREO ELLIPTA) 200-25 MCG/INH AEPB Inhale 1 puff into the lungs daily.    . Fluticasone-Umeclidin-Vilant (TRELEGY ELLIPTA) 100-62.5-25 MCG/INH AEPB Inhale 1 puff into the lungs daily. 60 each 0  . loratadine (CLARITIN) 10 MG tablet Take 1 tablet (10 mg total) by mouth daily.  30 tablet 11  . LORazepam (ATIVAN) 1 MG tablet Take 1 tablet (1 mg total) by mouth every 8 (eight) hours as needed. for anxiety 70 tablet 1  . pantoprazole (PROTONIX) 40 MG tablet Take 1 tablet (40 mg total) by mouth daily. 90 tablet 1  . Spacer/Aero-Holding Chambers (AEROCHAMBER PLUS WITH MASK) inhaler Use as instructed 1 each 2  . triamcinolone cream (KENALOG) 0.1 % Apply topically 2 (two) times daily. Apply to affected area 45 g 1   No current facility-administered medications for this visit.     Physical Exam:     BP 100/60   Pulse 85   Ht 5' 5.5" (1.664 m)   Wt 138 lb (62.6 kg)   SpO2 97%   BMI 22.62 kg/m   GENERAL:  Pleasant female in NAD PSYCH: : Cooperative, normal affect EENT:  conjunctiva pink, mucous membranes moist, neck supple without masses CARDIAC:  RRR, ***murmur heard, no peripheral edema PULM: Normal respiratory effort, lungs CTA bilaterally, no wheezing ABDOMEN:  Nondistended, soft, nontender. No obvious masses, no hepatomegaly,  normal bowel sounds SKIN:  turgor, no lesions seen Musculoskeletal:  Normal muscle tone, normal strength NEURO: Alert and oriented x 3, no focal neurologic deficits   Tye Savoy , NP 10/07/2018, 10:20 AM

## 2018-10-07 NOTE — Progress Notes (Signed)
Chief Complaint:    Anemia, heme positive stool  IMPRESSION and PLAN:    77 year old female with mild but chronic anemia recently found to be iron deficient with ferritin of 5 and positive immunochemical (no guaiac based) stool study.  No overt GI blood loss.  Last colonoscopy in 2011 ago.  -For further evaluation she will be scheduled for colonoscopy. The risks and benefits of colonoscopy with possible polypectomy were discussed and the patient agrees to proceed. The risks and benefits of colonoscopy with possible polypectomy were discussed and the patient agrees to proceed.  Patient is requesting this procedure be done sooner than what Dr. [name] has available  I have therefore made arrangements for her to have the colonoscopy done with Dr. Havery Moros.  -If colonoscopy negative then consider celiac studies for iron deficiency anemia -cbc today, make sure hgb stable -she is concerned that hgb may have declined further, I think unlikely, especially since now on iron but recheck labs. She doesn't need ferritin repeated yet.     HPI:     Patient is a 77 year old female known to Dr. [name].  She has a history of eosinophilic esophagitis with stricturing.  She is here now with husband for evaluation of iron deficiency anemia and heme positive stool.  Patient has never had any black stools or visible blood. Her IFOBT is + .  Hgb 10.5 , ferritin is 5 .  Baseline hgb little hard to discern but tends to be around 11-12. She started iron supplements about 3 weeks ago, taking MiraLAX with it to prevent constipation. Takes a daily baby aspirin, no other NSAIDs.   She has no abdominal pain, nausea or vomiting.  Review of systems:     No chest pain, no SOB, no fevers, no urinary sx   Past Medical History:  Diagnosis Date  . Abdominal pain 08/11/2017  . ALKALINE PHOSPHATASE, ELEVATED 03/15/2009  . Allergic state 06/10/2012  . Anemia   . Anxiety   . Anxiety and depression 04/28/2011  . Arthritis    . Atypical chest pain 11/30/2011  . 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 Baker's cyst of knee 05/22/2011  . 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 Carotid artery disease ([place])    a. Carotid duplex 03/2014: stable 1-39% BICA, f/u due 03/2016.  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    [name] COPD (chronic obstructive pulmonary disease) ([place])   . Dermatitis 11/23/2012  . Dysphagia   . EE (eosinophilic esophagitis)   . Elevated sed rate 08/02/2013  . Esophageal ring   . ESOPHAGEAL STRICTURE 03/29/2009  . Fall 11/23/2012  . Family history of breast cancer   . Family history of colon cancer   . Family history of ovarian cancer   . Family history of pancreatic cancer   . Folliculitis of nose 6/38/4665  . GERD (gastroesophageal reflux disease) 09/29/2009   improved s/p cholecystectomy and esophagus dilatation  . Hematuria   . History of chicken pox   . History of measles   . History of shingles    2 episodes  . Hx of echocardiogram    a. Echo 01/2013: mild LVH, EF 55-60%, normal wall motion, Gr 1 diast dysfn  . Hyperlipidemia   . Knee pain, bilateral 07/23/2011  . Medicare annual wellness visit, subsequent 06/19/2015  . Mixed hyperlipidemia 10/17/2010  Qualifier: Diagnosis of  By: Mack Guise    . Orthostasis   . Osteopenia 03/14/2011  . Personal history of chemotherapy 2001  . Personal history of radiation therapy 2001   rt breast  . 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 Pneumonia 2-10   pleurisy  . PVC's (premature ventricular contractions)    a. Event monitor 01/2013: NSR, extensive PVCs.  . Radial neck fracture 10/2011   minimally displaced  . Sinusitis, acute 12/04/2015  . Trauma 10/18/2011  . URI (upper respiratory infection) 09/02/2012  . Urinary frequency 12/12/2014  . Urinary incontinence 03/19/2012  . Vaginitis 05/22/2011    Patient's surgical history,  family medical history, social history, medications and allergies were all reviewed in Epic   Creatinine clearance cannot be calculated (Patient's most recent lab result is older than the maximum 21 days allowed.)  Current Outpatient Medications  Medication Sig Dispense Refill  . acetaminophen (TYLENOL) 500 MG tablet Take 500-1,000 mg by mouth every 6 (six) hours as needed (for pain.).    [name] AMBULATORY NON FORMULARY MEDICATION Medication Name: Budesonide 2 mg/10 ml Please take 1 mg by mouth two times a day 140 mL 1  . amitriptyline (ELAVIL) 75 MG tablet Take 2 tablets (150 mg total) by mouth at bedtime. 60 tablet 5  . aspirin EC 81 MG tablet Take 81 mg by mouth daily.    [name] atorvastatin (LIPITOR) 20 MG tablet Take 2 tablets (40 mg total) by mouth daily at 6 PM. 180 tablet 3  . budesonide (PULMICORT) 0.25 MG/2ML nebulizer solution TAKE FIVE mLs BY MOUTH TWICE DAILY  1  . Cholecalciferol (VITAMIN D3) 1000 UNITS CAPS Take 2,000 Units by mouth daily.     . Cyanocobalamin (VITAMIN B 12 PO) Take 1 tablet by mouth daily.    [name] diltiazem (CARDIZEM) 30 MG tablet TAKE 1 TABLET BY MOUTH ONCE DAILY FOR PALPITATIONS 90 tablet 3  . Ferrous Fumarate-Folic Acid 751-7 MG TABS Take 1 tablet by mouth daily. 30 each 3  . fluticasone furoate-vilanterol (BREO ELLIPTA) 200-25 MCG/INH AEPB Inhale 1 puff into the lungs daily.    . Fluticasone-Umeclidin-Vilant (TRELEGY ELLIPTA) 100-62.5-25 MCG/INH AEPB Inhale 1 puff into the lungs daily. 60 each 0  . loratadine (CLARITIN) 10 MG tablet Take 1 tablet (10 mg total) by mouth daily. 30 tablet 11  . LORazepam (ATIVAN) 1 MG tablet Take 1 tablet (1 mg total) by mouth every 8 (eight) hours as needed. for anxiety 70 tablet 1  . pantoprazole (PROTONIX) 40 MG tablet Take 1 tablet (40 mg total) by mouth daily. 90 tablet 1  . Spacer/Aero-Holding Chambers (AEROCHAMBER PLUS WITH MASK) inhaler Use as instructed 1 each 2  . triamcinolone cream (KENALOG) 0.1 % Apply topically 2 (two) times  daily. Apply to affected area 45 g 1   No current facility-administered medications for this visit.     Physical Exam:     BP 100/60   Pulse 85   Ht 5' 5.5" (1.664 m)   Wt 138 lb (62.6 kg)   SpO2 97%   BMI 22.62 kg/m   GENERAL:  Pleasant female in NAD PSYCH: : Cooperative, normal affect EENT:  conjunctiva pink, mucous membranes moist, neck supple without masses CARDIAC:  RRR,  no peripheral edema PULM: Normal respiratory effort, lungs CTA bilaterally, no wheezing ABDOMEN:  Nondistended, soft, nontender. No obvious masses, no hepatomegaly,  normal bowel sounds SKIN:  turgor, no lesions seen Musculoskeletal:  Normal muscle tone, normal strength NEURO: Alert and oriented x  3, no focal neurologic deficits   Tye Savoy , NP 10/07/2018, 10:21 AM

## 2018-10-08 ENCOUNTER — Encounter: Payer: Self-pay | Admitting: Gastroenterology

## 2018-10-10 ENCOUNTER — Telehealth: Payer: Self-pay | Admitting: Hematology

## 2018-10-10 NOTE — Telephone Encounter (Signed)
Tried to reach about 12/20 mailbox is full

## 2018-10-12 ENCOUNTER — Encounter: Payer: Self-pay | Admitting: Family Medicine

## 2018-10-13 ENCOUNTER — Telehealth: Payer: Self-pay | Admitting: Gastroenterology

## 2018-10-13 NOTE — Telephone Encounter (Signed)
Left message for patient to call back on her Magic jack line

## 2018-10-13 NOTE — Telephone Encounter (Signed)
12.2.19    Dr. Vaughan Browner:    Can corticosteroids from my inhaler cause gastrointestinal bleeding? Perhaps you know blood was found in my stools, and I'm having a colonoscopy Friday, Dec. 6th. If your answer is yes or a maybe, please get in touch right-away with Dr. Willette Alma at [facility] in [facility].      Thank you for your time?    Anne [name]   ____________________________________________________________________________________________  Dr. Vaughan Browner, I have attached the patients e-mail above. Please advise once available and I can contact the patient. Thank you.

## 2018-10-14 NOTE — Telephone Encounter (Signed)
Left message for patient to call back  

## 2018-10-14 NOTE — Telephone Encounter (Signed)
Pt is returning your call [phone]

## 2018-10-14 NOTE — Telephone Encounter (Signed)
I explained to her she needs to be on clear liquid on the day of the procedure.  She may use the apple sauce on Thursday to take her meds.

## 2018-10-15 ENCOUNTER — Telehealth: Payer: Self-pay | Admitting: Gastroenterology

## 2018-10-15 NOTE — Telephone Encounter (Signed)
Faxed "Pay no More than $50" voucher to [facility] in [place] at [phone]

## 2018-10-15 NOTE — Telephone Encounter (Signed)
Pt said that her insurance does not cover suprep and if she can have a generic one.

## 2018-10-17 ENCOUNTER — Encounter: Payer: Self-pay | Admitting: Gastroenterology

## 2018-10-17 ENCOUNTER — Ambulatory Visit (AMBULATORY_SURGERY_CENTER): Payer: PPO | Admitting: Gastroenterology

## 2018-10-17 VITALS — BP 129/58 | HR 76 | Temp 96.6°F | Resp 15 | Ht 65.5 in | Wt 138.0 lb

## 2018-10-17 DIAGNOSIS — D123 Benign neoplasm of transverse colon: Secondary | ICD-10-CM | POA: Diagnosis not present

## 2018-10-17 DIAGNOSIS — I10 Essential (primary) hypertension: Secondary | ICD-10-CM | POA: Diagnosis not present

## 2018-10-17 DIAGNOSIS — K219 Gastro-esophageal reflux disease without esophagitis: Secondary | ICD-10-CM | POA: Diagnosis not present

## 2018-10-17 DIAGNOSIS — D509 Iron deficiency anemia, unspecified: Secondary | ICD-10-CM | POA: Diagnosis not present

## 2018-10-17 DIAGNOSIS — R194 Change in bowel habit: Secondary | ICD-10-CM | POA: Diagnosis not present

## 2018-10-17 DIAGNOSIS — D120 Benign neoplasm of cecum: Secondary | ICD-10-CM

## 2018-10-17 DIAGNOSIS — J449 Chronic obstructive pulmonary disease, unspecified: Secondary | ICD-10-CM | POA: Diagnosis not present

## 2018-10-17 DIAGNOSIS — J45909 Unspecified asthma, uncomplicated: Secondary | ICD-10-CM | POA: Diagnosis not present

## 2018-10-17 MED ORDER — SODIUM CHLORIDE 0.9 % IV SOLN: 500.0000 mL | Freq: Once | INTRAVENOUS | Status: DC

## 2018-10-17 NOTE — Telephone Encounter (Signed)
Hello Mrs Zou,  I am sorry for not replying earlier as I was away from office.  GI bleeding is not a common complication of inhaled corticosteroids and risk of interactions with other meds are low. If you are getting any benefit from the inhaler then I would advise to continue using it.   On the other hand if you don't notice any difference to your symptoms after trying the [facility] then we can stop and reassess at return clinic visit.  Marshell Garfinkel MD [facility] 10/17/2018, 10:18 AM

## 2018-10-17 NOTE — Op Note (Signed)
[place] Patient Name: Anne [name] Procedure Date: 10/17/2018 10:52 AM MRN: [id number] Endoscopist: Remo Lipps P. Havery Moros , MD Age: 77 Referring MD:  Date of Birth: 1941/06/22 Gender: Female Account #: [id number] Procedure:                Colonoscopy Indications:              Iron deficiency anemia, Positive fecal                            immunochemical test Medicines:                Monitored Anesthesia Care Procedure:                Pre-Anesthesia Assessment:                           - Prior to the procedure, a History and Physical                            was performed, and patient medications and                            allergies were reviewed. The patient's tolerance of                            previous anesthesia was also reviewed. The risks                            and benefits of the procedure and the sedation                            options and risks were discussed with the patient.                            All questions were answered, and informed consent                            was obtained. Prior Anticoagulants: The patient has                            taken no previous anticoagulant or antiplatelet                            agents. ASA Grade Assessment: III - A patient with                            severe systemic disease. After reviewing the risks                            and benefits, the patient was deemed in                            satisfactory condition to undergo the procedure.  After obtaining informed consent, the colonoscope                            was passed under direct vision. Throughout the                            procedure, the patient's blood pressure, pulse, and                            oxygen saturations were monitored continuously. The                            Model PCF-H190DL [id number]) scope was introduced                            through the anus and advanced to the  the terminal                            ileum, with identification of the appendiceal                            orifice and IC valve. The colonoscopy was                            technically difficult and complex due to a                            redundant colon. The patient tolerated the                            procedure well. The quality of the bowel                            preparation was good. The terminal ileum, ileocecal                            valve, appendiceal orifice, and rectum were                            photographed. Scope In: 11:00:28 AM Scope Out: 11:40:23 AM Scope Withdrawal Time: 0 hours 26 minutes 12 seconds  Total Procedure Duration: 0 hours 39 minutes 55 seconds  Findings:                 The perianal and digital rectal examinations were                            normal.                           The terminal ileum appeared normal.                           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                           A 8 mm polyp was found in the cecum. The polyp was                            sessile. The polyp was removed with a cold snare.                            Resection and retrieval were complete.                           A 4 mm polyp was found in the transverse colon. The                            polyp was sessile. The polyp was removed with a                            cold snare. Resection and retrieval were complete.                           The colon was extremely redundant and tortous -                            cecal intubation was challenging, this prolonged                            the procedure.                           There was a lipoma noted in the transverse colon.                            The exam was otherwise without abnormality. Complications:            No immediate complications. Estimated blood loss:                             Minimal. Estimated Blood Loss:     Estimated blood loss was minimal. Impression:               - The examined portion of the ileum was normal.                           - Seven colonic angiodysplastic lesions.                           - One 8 mm polyp in the cecum, removed with a cold                            snare. Resected and retrieved.                           - One 4 mm  polyp in the transverse colon, removed                            with a cold snare. Resected and retrieved.                           - Redundant colon.                           - Lipoma in the transverse colon.                           - The examination was otherwise normal.                           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 Recommendation:           - Patient has a contact number available for                            emergencies. The signs and symptoms of potential                            delayed complications were discussed with the                            patient. Return to normal activities tomorrow.                            Written discharge instructions were provided to the                            patient.                           - Resume previous diet.                           - Continue present medications.                           - Continue oral iron. Repeat CBC in 1 month. If                            anemia persists consider colonoscopy at the                            hospital with AVM ablation.                           - Await pathology results. Remo Lipps P. Armbruster, MD 10/17/2018 11:47:57 AM This report has been signed electronically.

## 2018-10-17 NOTE — Progress Notes (Signed)
Report to PACU, RN, vss, BBS= Clear.  

## 2018-10-17 NOTE — Progress Notes (Signed)
Called to room to assist during endoscopic procedure.  Patient ID and intended procedure confirmed with present staff. Received instructions for my participation in the procedure from the performing physician.  

## 2018-10-17 NOTE — Patient Instructions (Signed)
Handouts provided:  Polyps  YOU HAD AN ENDOSCOPIC PROCEDURE TODAY AT THE [facility]:   Refer to the procedure report that was given to you for any specific questions about what was found during the examination.  If the procedure report does not answer your questions, please call your gastroenterologist to clarify.  If you requested that your care partner not be given the details of your procedure findings, then the procedure report has been included in a sealed envelope for you to review at your convenience later.  YOU SHOULD EXPECT: Some feelings of bloating in the abdomen. Passage of more gas than usual.  Walking can help get rid of the air that was put into your GI tract during the procedure and reduce the bloating. If you had a lower endoscopy (such as a colonoscopy or flexible sigmoidoscopy) you may notice spotting of blood in your stool or on the toilet paper. If you underwent a bowel prep for your procedure, you may not have a normal bowel movement for a few days.  Please Note:  You might notice some irritation and congestion in your nose or some drainage.  This is from the oxygen used during your procedure.  There is no need for concern and it should clear up in a day or so.  SYMPTOMS TO REPORT IMMEDIATELY:   Following lower endoscopy (colonoscopy or flexible sigmoidoscopy):  Excessive amounts of blood in the stool  Significant tenderness or worsening of abdominal pains  Swelling of the abdomen that is new, acute  Fever of 100F or higher  For urgent or emergent issues, a gastroenterologist can be reached at any hour by calling [phone].   DIET:  We do recommend a small meal at first, but then you may proceed to your regular diet.  Drink plenty of fluids but you should avoid alcoholic beverages for 24 hours.  ACTIVITY:  You should plan to take it easy for the rest of today and you should NOT DRIVE or use heavy machinery until tomorrow (because of the sedation  medicines used during the test).    FOLLOW UP: Our staff will call the number listed on your records the next business day following your procedure to check on you and address any questions or concerns that you may have regarding the information given to you following your procedure. If we do not reach you, we will leave a message.  However, if you are feeling well and you are not experiencing any problems, there is no need to return our call.  We will assume that you have returned to your regular daily activities without incident.  If any biopsies were taken you will be contacted by phone or by letter within the next 1-3 weeks.  Please call us at [phone] if you have not heard about the biopsies in 3 weeks.    SIGNATURES/CONFIDENTIALITY: You and/or your care partner have signed paperwork which will be entered into your electronic medical record.  These signatures attest to the fact that that the information above on your After Visit Summary has been reviewed and is understood.  Full responsibility of the confidentiality of this discharge information lies with you and/or your care-partner.  

## 2018-10-20 ENCOUNTER — Telehealth: Payer: Self-pay

## 2018-10-20 ENCOUNTER — Other Ambulatory Visit: Payer: Self-pay

## 2018-10-20 DIAGNOSIS — D509 Iron deficiency anemia, unspecified: Secondary | ICD-10-CM

## 2018-10-20 NOTE — Telephone Encounter (Signed)
Patient calling stating she is having chest congestion, headache, nasal congestion, and is requesting something be sent in.  [facility] is [facility] in [place]. She declined appt stating she did not feel like coming in. CB is [phone].

## 2018-10-20 NOTE — Telephone Encounter (Signed)
  Follow up Call-  Call back number 10/17/2018 05/27/2017 05/13/2017  Post procedure Call Back phone  # [phone]  Permission to leave phone message Yes Yes Yes  Some recent data might be hidden     Patient questions:  Do you have a fever, pain , or abdominal swelling? No. Pain Score  0 *  Have you tolerated food without any problems? Yes.    Have you been able to return to your normal activities? Yes.    Do you have any questions about your discharge instructions: Diet   No. Medications  No. Follow up visit  No.  Do you have questions or concerns about your Care? No.  Actions: * If pain score is 4 or above: No action needed, pain <4.

## 2018-10-20 NOTE — Telephone Encounter (Signed)
[place] R "Torie"  to [place], MD       10/20/18 4:25 PM  12.9.19   Since speaking with your assistant, I began having pain in the area below my ribs which entails a chronic ache. Having not eaten anything other than a teaspoon of applesauce to assist my swallowing of Tylenol capsules, the pain isn't associated with food. I did use my inhaler at my regular time of 1:30.   My congestion is in my nose and my lungs. The Tylenol is for my continuous headache and an attempt to feel better overall, which is not happening.  When I cough, my chest is so full of congestion that I get no relief, just pain. I am very sick and scared.   Please help me.   [place] and spoke to pt and verified above symptoms. Pt reports of chest discomfort under breast line, headache & prod cough with white to brown mucus x3 days. Pt states sob is baseline. Pt states symptoms occurred day after colonoscopy.   Denied fever, chills or sweats.  Pt has been scheduled for acute visit on 10/21/18 at 4:00. Nothing further is needed.

## 2018-10-20 NOTE — Telephone Encounter (Signed)
Unable to leave message mailbox full.

## 2018-10-20 NOTE — Telephone Encounter (Signed)
Called and spoke with Anne [name] after receiving mychart message.  Anne [name] has had complaints of cough with white to yellow and some brown phlegm, postnasal drainage with some brown tint in it which Anne [name] states began around Thanksgiving 11/27. Anne [name] states her symptoms worsened after she had a colonoscopy Friday, 10/17/18. Anne [name] now also has complaints of fatigue, chest congestion, headache, nasal congestion, hoarseness.  Anne [name] denies any fever.  Anne [name] is requesting something to be called in to help with her symptoms. Anne [name] declines an appt as she states she did not feel like coming in.  Dr. Vaughan Browner, please advise on this for Anne [name]. Thanks!

## 2018-10-21 ENCOUNTER — Ambulatory Visit: Payer: PPO | Admitting: Primary Care

## 2018-10-21 ENCOUNTER — Telehealth: Payer: Self-pay | Admitting: Pulmonary Disease

## 2018-10-21 NOTE — Telephone Encounter (Signed)
ATC patient and let her know unable to reach, left SMS on phone to call office back

## 2018-10-21 NOTE — Telephone Encounter (Signed)
Yes. It is ok to use both breo and nasocort at same time

## 2018-10-21 NOTE — Telephone Encounter (Signed)
Patient wanting to know is it okay to take both Breo and Nasacort at the same time.  Dr. Vaughan Browner please advise

## 2018-10-22 NOTE — Telephone Encounter (Signed)
Dr. Vaughan Browner please advise on patient e-mail below.

## 2018-10-23 ENCOUNTER — Ambulatory Visit: Payer: Self-pay

## 2018-10-23 MED ORDER — BENZONATATE 200 MG PO CAPS: 200.0000 mg | ORAL_CAPSULE | Freq: Two times a day (BID) | ORAL | 0 refills | Status: DC

## 2018-10-23 NOTE — Telephone Encounter (Signed)
We can make a clinic appointment to review the side effects of the [facility] and check on the symptoms of URI. Can you please specify what symptoms you are having and what treatment you are getting.  Your email on Dec 7th states that the breo is really helping with your symptoms of dyspnea so I would be reluctant to stop. However we can do it if you are having side effects. It is hard to make a determination without office visit.   Marshell Garfinkel MD [facility] 10/23/2018, 2:26 PM

## 2018-10-23 NOTE — Telephone Encounter (Signed)
Patient returned phone call. °

## 2018-10-23 NOTE — Telephone Encounter (Signed)
Notify Encouraged increased rest and hydration, add probiotics, zinc such as Coldeze or Xicam. Treat fevers as needed. Plain Mucinex twice daily, vitamin C 500 to 1000 mg daily and Elderberry then send in Tessalon Perles 200 mg tabs 1 tab po tid prn disp #30

## 2018-10-23 NOTE — Telephone Encounter (Signed)
Called and spoke with pt letting her know the information stated by Dr.Mannam.  Pt expressed understanding and stated she did want to schedule an OV and only wanted to see Dr. Vaughan Browner. OV scheduled for pt with Dr. Vaughan Browner tomorrow, 12/13 at

## 2018-10-23 NOTE — Telephone Encounter (Signed)
Called pt and advised message from the provider. Pt understood and verbalized understanding. Nothing further is needed.    

## 2018-10-23 NOTE — Telephone Encounter (Signed)
ATC patient left a SMS message due to voice mail being full.

## 2018-10-23 NOTE — Telephone Encounter (Signed)
Called patient left detailed message on voicemail   If patient calls back nurse triage may handle

## 2018-10-23 NOTE — Telephone Encounter (Signed)
Patient called and she says "I want to know if I can take Robitussin DM for my upper respiratory infection. I've been sick since Saturday morning when I woke up. I initially coughed up white, light yellow sputum when I got up Saturday morning, but now nothing is coming up. I can hear it rattling around in my chest when I cough, but nothing is coming up. I remember someone telling me not to take the Robitussin DM because the DM will make my heart rate go up. But, that's the only thing that will bring up the congestion. Plain Robitussin is not working." I asked about fever, she says "no fever, no chills. I've just been laying around on my couch since Saturday, not feeling like doing anything. I don't even have the energy to get up to get ready to come to the office. Will you just ask if I can take the DM and if not, what else can I take for this?" I advised that because she has not been evaluated by a physician for this, she may need to come to the office for a visit, but I will send over to Dr. Charlett Blake and expect a call back with her recommendation, patient verbalized understanding.   Reason for Disposition . [1] Known COPD or other severe lung disease (i.e., bronchiectasis, cystic fibrosis, lung surgery) AND [2] worsening symptoms (i.e., increased sputum purulence or amount, increased breathing difficulty  Answer Assessment - Initial Assessment Questions 1. ONSET: "When did the cough begin?"      Saturday morning 2. SEVERITY: "How bad is the cough today?"      Terrible 3. RESPIRATORY DISTRESS: "Describe your breathing."      Inhaler is helping my breathing, not SOB now 4. FEVER: "Do you have a fever?" If so, ask: "What is your temperature, how was it measured, and when did it start?"     No 5. SPUTUM: "Describe the color of your sputum" (clear, white, yellow, green)     Nothing coming up now; 1 time it came up Saturday it was white and light yellow. Congestion is there 6. HEMOPTYSIS: "Are you coughing  up any blood?" If so ask: "How much?" (flecks, streaks, tablespoons, etc.)     N/A 7. CARDIAC HISTORY: "Do you have any history of heart disease?" (e.g., heart attack, congestive heart failure)      No 8. LUNG HISTORY: "Do you have any history of lung disease?"  (e.g., pulmonary embolus, asthma, emphysema)     Yes-emphysema, COPD, Asthma, radiation fibrosis 9. PE RISK FACTORS: "Do you have a history of blood clots?" (or: recent major surgery, recent prolonged travel, bedridden)     No 10. OTHER SYMPTOMS: "Do you have any other symptoms?" (e.g., runny nose, wheezing, chest pain)       Runny nose-was brown drainage, but clear now 11. PREGNANCY: "Is there any chance you are pregnant?" "When was your last menstrual period?"       No 12. TRAVEL: "Have you traveled out of the country in the last month?" (e.g., travel history, exposures)       No  Protocols used: [place]

## 2018-10-24 ENCOUNTER — Ambulatory Visit (INDEPENDENT_AMBULATORY_CARE_PROVIDER_SITE_OTHER): Payer: PPO | Admitting: Pulmonary Disease

## 2018-10-24 ENCOUNTER — Encounter: Payer: Self-pay | Admitting: Pulmonary Disease

## 2018-10-24 VITALS — BP 118/70 | HR 96 | Ht 65.5 in | Wt 138.0 lb

## 2018-10-24 DIAGNOSIS — J439 Emphysema, unspecified: Secondary | ICD-10-CM

## 2018-10-24 MED ORDER — UMECLIDINIUM-VILANTEROL 62.5-25 MCG/INH IN AEPB: 1.0000 | INHALATION_SPRAY | Freq: Every day | RESPIRATORY_TRACT | 0 refills | Status: AC

## 2018-10-24 NOTE — Patient Instructions (Addendum)
We will stop the Breo and observe your symptoms If you start feeling short of breath again then we will give you an anoro inhaler to use.  This new inhaler does not have steroids in it so has less a chance of causing respiratory tract infections  Follow-up as scheduled on January 28.

## 2018-10-24 NOTE — Progress Notes (Signed)
Anne [name]    [id number]    November 10, 1941  Primary Care Physician:[name], [name], MD  Referring Physician: [name], MD [address] [place], [place] [address]  Chief complaint: Follow-up for emphysema, COPD   HPI: 77 year old with history of breast cancer, emphysema, eosinophilic esophagitis, palpitations, hyperlipidemia Complains of progressive dyspnea on exertion for the past 2 years.  She does not have symptoms at rest.  Denies any cough, sputum production, wheezing.  She is on albuterol which she is using a couple of times per week.  She has significant seasonal allergies, notes episodes of increasing dyspnea, tightness after working in her yard.  She also has acid reflux, eosinophilic esophagitis and follows with Dr. [name].  She is on budesonide, PPI therapy for this. Started on Symbicort at last visit in June 2018.  She stopped using it after few weeks as it made her hoarse.    Pets: No pets Occupation: Used to work in administration for a [employer] Exposures: Has some dampness in the crawlspace with possible mold exposure. Smoking history: 50-70-pack-year smoking history.  Quit in 2008. Travel history: No significant travel.  Interim history: She was prescribed trelegy inhaler but she stopped after a few days as it was not making any difference.  Started on iron therapy for iron deficiency anemia. Also started on Breo a month ago.  She feels that overall her breathing has improved but cannot tell if it is from the correction of anemia or the inhaler  She is developed upper respiratory tract infection for the past 1 week with chest congestion, cough, dyspnea.  She treated herself with over-the-counter medication with gradual improvement. Return to clinic today to discuss side effects of Breo inhaler.  She is concerned about continuing on this due to side effects of respiratory tract infection.  Outpatient Encounter Medications as of  10/24/2018  Medication Sig  . acetaminophen (TYLENOL) 500 MG tablet Take 500-1,000 mg by mouth every 6 (six) hours as needed (for pain.).  [name] AMBULATORY NON FORMULARY MEDICATION Medication Name: Budesonide 2 mg/10 ml Please take 1 mg by mouth two times a day  . amitriptyline (ELAVIL) 75 MG tablet Take 2 tablets (150 mg total) by mouth at bedtime.  [name] aspirin EC 81 MG tablet Take 81 mg by mouth daily.  [name] atorvastatin (LIPITOR) 20 MG tablet Take 2 tablets (40 mg total) by mouth daily at 6 PM.  . benzonatate (TESSALON) 200 MG capsule Take 1 capsule (200 mg total) by mouth 2 (two) times daily.  . Biotin (BIOTIN 5000) 5 MG CAPS Take by mouth.  . budesonide (PULMICORT) 0.25 MG/2ML nebulizer solution TAKE FIVE mLs BY MOUTH TWICE DAILY  . Cholecalciferol (VITAMIN D3) 1000 UNITS CAPS Take 2,000 Units by mouth daily.   . Cyanocobalamin (VITAMIN B 12 PO) Take 1 tablet by mouth daily.  [name] diltiazem (CARDIZEM) 30 MG tablet TAKE 1 TABLET BY MOUTH ONCE DAILY FOR PALPITATIONS  . Ferrous Fumarate-Folic Acid 542-7 MG TABS Take 1 tablet by mouth daily.  . fluticasone furoate-vilanterol (BREO ELLIPTA) 200-25 MCG/INH AEPB Inhale 1 puff into the lungs daily.  . Fluticasone-Umeclidin-Vilant (TRELEGY ELLIPTA) 100-62.5-25 MCG/INH AEPB Inhale 1 puff into the lungs daily.  [name] loratadine (CLARITIN) 10 MG tablet Take 1 tablet (10 mg total) by mouth daily.  [name] LORazepam (ATIVAN) 1 MG tablet Take 1 tablet (1 mg total) by mouth every 8 (eight) hours as needed. for anxiety  . pantoprazole (  PROTONIX) 40 MG tablet Take 1 tablet (40 mg total) by mouth daily.  [name] Spacer/Aero-Holding Chambers (AEROCHAMBER PLUS WITH MASK) inhaler Use as instructed  . triamcinolone cream (KENALOG) 0.1 % Apply topically 2 (two) times daily. Apply to affected area   No facility-administered encounter medications on file as of 10/24/2018.    Physical Exam: Blood pressure 118/70, pulse 96, height 5' 5.5" (1.664 m), weight 138 lb (62.6 kg), SpO2 97 %. Gen:       No acute distress HEENT:  EOMI, sclera anicteric Neck:     No masses; no thyromegaly Lungs:    Clear to auscultation bilaterally; normal respiratory effort CV:         Regular rate and rhythm; no murmurs Abd:      + bowel sounds; soft, non-tender; no palpable masses, no distension Ext:    No edema; adequate peripheral perfusion Skin:      Warm and dry; no rash Neuro: alert and oriented x 3 Psych: normal mood and affect  Data Reviewed: Imaging CT chest 07/03/2013- postradiation changes in the right upper lobe, centrilobular emphysema, nodular thickening of the pleura at the bases CT abdomen 08/10/17- visualized lung bases show minimal atelectasis, no interstitial lung disease. CT high-resolution 08/12/2018-moderate emphysema, mild radiation fibrosis in the apical right upper lobe, two-vessel coronary atherosclerosis. I reviewed the images personally.  Labs CBC 04/16/2018-WBC 8.6, eos 1.8%, absolute eosinophil count 155 Allergy profile 05/06/1989-IgE 271, sensitive to dust mite.  Mild allergies to cat, dog, tree pollen  PFTs Spirometry  09/13/2009 FVC 1.65 (49%], FEV1 1.39 (55%], F/F 84  08/11/2018 FVC 2.3 [77%), FEV1 2.3 [100%), F/F 97 Normal test.  PFTs 09/09/2018 FVC 2.62 [1%), FEV1 2.07 [92%), F/F 79, TLC 56%, DLCO 48%, DLCO/VA 76%  FENO 08/11/2018-28  Assessment:  Emphysema, dyspnea Lung imaging reviewed with minimal scarring in the right upper lobe consistent with postradiation changes and moderate emphysema.  No evidence of interstitial lung disease. There are minimal obstructive changes on pulmonary function tests  She also concerned about prolonged use of Breo due to side effects of respiratory tract infection Symptoms overall have improved over the past few weeks.  I am not sure if this is due to the inhaler or the fact that she is getting treated for iron deficiency anemia.  I have asked her to stop the [facility] and monitor how her symptoms are doing If they worsen then  we can start Anoro inhaler which does not have a steroid component.  Suspect there is a component of deconditioning contributing to her symptoms Encouraged her to start an exercise program at home and stay active.  Plan/Recommendations: - Stop Breo and observe symptoms - If symptoms recur then we will start Anoro. - Start exercise therapy  Marshell Garfinkel MD [facility] 10/24/2018, 3:56 PM  CC: [name], MD

## 2018-10-24 NOTE — Addendum Note (Signed)
Addended by: Maryanna Shape A on: 10/24/2018 04:19 PM   Modules accepted: Orders

## 2018-10-27 ENCOUNTER — Encounter: Payer: Self-pay | Admitting: Family Medicine

## 2018-10-31 ENCOUNTER — Inpatient Hospital Stay: Payer: PPO | Attending: Hematology

## 2018-10-31 ENCOUNTER — Other Ambulatory Visit: Payer: Self-pay | Admitting: Hematology

## 2018-10-31 DIAGNOSIS — D509 Iron deficiency anemia, unspecified: Secondary | ICD-10-CM | POA: Diagnosis not present

## 2018-10-31 DIAGNOSIS — Z79899 Other long term (current) drug therapy: Secondary | ICD-10-CM | POA: Insufficient documentation

## 2018-10-31 DIAGNOSIS — J449 Chronic obstructive pulmonary disease, unspecified: Secondary | ICD-10-CM | POA: Diagnosis not present

## 2018-10-31 DIAGNOSIS — Z421 Encounter for breast reconstruction following mastectomy: Secondary | ICD-10-CM | POA: Insufficient documentation

## 2018-10-31 DIAGNOSIS — Z923 Personal history of irradiation: Secondary | ICD-10-CM | POA: Insufficient documentation

## 2018-10-31 DIAGNOSIS — Z170 Estrogen receptor positive status [ER+]: Secondary | ICD-10-CM | POA: Diagnosis not present

## 2018-10-31 DIAGNOSIS — Z7982 Long term (current) use of aspirin: Secondary | ICD-10-CM | POA: Insufficient documentation

## 2018-10-31 DIAGNOSIS — D649 Anemia, unspecified: Secondary | ICD-10-CM

## 2018-10-31 DIAGNOSIS — Z853 Personal history of malignant neoplasm of breast: Secondary | ICD-10-CM | POA: Insufficient documentation

## 2018-10-31 LAB — CMP (CANCER CENTER ONLY)
ALK PHOS: 125 U/L (ref 38–126)
ALT: 18 U/L (ref 0–44)
AST: 17 U/L (ref 15–41)
Albumin: 3.3 g/dL — ABNORMAL LOW (ref 3.5–5.0)
Anion gap: 10 (ref 5–15)
BUN: 14 mg/dL (ref 8–23)
CALCIUM: 9.2 mg/dL (ref 8.9–10.3)
CO2: 28 mmol/L (ref 22–32)
Chloride: 104 mmol/L (ref 98–111)
Creatinine: 0.72 mg/dL (ref 0.44–1.00)
GFR, Est AFR Am: >60 mL/min (ref >60)
GFR, Estimated: >60 mL/min (ref >60)
Glucose, Bld: 103 mg/dL — ABNORMAL HIGH (ref 70–99)
Potassium: 3.8 mmol/L (ref 3.5–5.1)
Sodium: 142 mmol/L (ref 135–145)
TOTAL PROTEIN: 7 g/dL (ref 6.5–8.1)
Total Bilirubin: 0.3 mg/dL (ref 0.3–1.2)

## 2018-10-31 LAB — CBC WITH DIFFERENTIAL (CANCER CENTER ONLY)
Abs Immature Granulocytes: 0.03 10*3/uL (ref 0.00–0.07)
BASOS PCT: 1 %
Basophils Absolute: 0 10*3/uL (ref 0.0–0.1)
Eosinophils Absolute: 0.1 10*3/uL (ref 0.0–0.5)
Eosinophils Relative: 2 %
HCT: 35.1 % — ABNORMAL LOW (ref 36.0–46.0)
Hemoglobin: 11 g/dL — ABNORMAL LOW (ref 12.0–15.0)
Immature Granulocytes: 0 %
Lymphocytes Relative: 23 %
Lymphs Abs: 1.7 10*3/uL (ref 0.7–4.0)
MCH: 28.6 pg (ref 26.0–34.0)
MCHC: 31.3 g/dL (ref 30.0–36.0)
MCV: 91.4 fL (ref 80.0–100.0)
Monocytes Absolute: 0.5 10*3/uL (ref 0.1–1.0)
Monocytes Relative: 6 %
Neutro Abs: 4.7 10*3/uL (ref 1.7–7.7)
Neutrophils Relative %: 68 %
Platelet Count: 309 10*3/uL (ref 150–400)
RBC: 3.84 MIL/uL — ABNORMAL LOW (ref 3.87–5.11)
RDW: 19.5 % — ABNORMAL HIGH (ref 11.5–15.5)
WBC Count: 7 10*3/uL (ref 4.0–10.5)
nRBC: 0 % (ref 0.0–0.2)

## 2018-11-01 ENCOUNTER — Encounter: Payer: Self-pay | Admitting: Family Medicine

## 2018-11-03 LAB — IRON AND TIBC
Iron: 58 ug/dL (ref 41–142)
Saturation Ratios: 21 % (ref 21–57)
TIBC: 283 ug/dL (ref 236–444)
UIBC: 224 ug/dL (ref 120–384)

## 2018-11-03 LAB — FERRITIN: Ferritin: 48 ng/mL (ref 11–307)

## 2018-11-06 ENCOUNTER — Encounter: Payer: Self-pay | Admitting: Hematology

## 2018-11-10 ENCOUNTER — Telehealth: Payer: Self-pay

## 2018-11-10 NOTE — Telephone Encounter (Signed)
Copied from [place] [id number]. Topic: General - Other >> Nov 07, 2018  1:03 PM Anne [name], NT wrote: Reason for CRM: Pt would like to see if her reports from this office has been being sent to Dr. Harlow Asa office? She states she is wanting them to be, but making sure they are. Please advise.

## 2018-11-11 NOTE — Telephone Encounter (Signed)
Records have been sent to Dr. Harlow Asa office

## 2018-11-21 ENCOUNTER — Encounter: Payer: Self-pay | Admitting: Family Medicine

## 2018-11-21 ENCOUNTER — Telehealth: Payer: Self-pay | Admitting: Family Medicine

## 2018-11-21 NOTE — Telephone Encounter (Signed)
Copied from [place] [id number]. Topic: General - Other >> Nov 21, 2018  1:02 PM Lennox Solders wrote: Reason for RWC:[facility] pharmacist at [facility] is calling the pt said amitriptyline 75 mg the tablets are too big. Pt would like small tablets. [address] in [place]

## 2018-11-23 ENCOUNTER — Encounter: Payer: Self-pay | Admitting: Hematology

## 2018-11-23 ENCOUNTER — Other Ambulatory Visit: Payer: Self-pay | Admitting: Family Medicine

## 2018-11-23 MED ORDER — AMITRIPTYLINE HCL 25 MG PO TABS: 150.0000 mg | ORAL_TABLET | Freq: Every day | ORAL | 1 refills | Status: DC

## 2018-11-24 DIAGNOSIS — H353131 Nonexudative age-related macular degeneration, bilateral, early dry stage: Secondary | ICD-10-CM | POA: Diagnosis not present

## 2018-11-24 DIAGNOSIS — H353211 Exudative age-related macular degeneration, right eye, with active choroidal neovascularization: Secondary | ICD-10-CM | POA: Diagnosis not present

## 2018-11-25 ENCOUNTER — Emergency Department
Admission: EM | Admit: 2018-11-25 | Discharge: 2018-11-25 | Discharge status: Absent without leave | Payer: Self-pay | Source: Home / Self Care

## 2018-11-25 NOTE — Telephone Encounter (Signed)
This was answered in 11/21/18 mychart message.

## 2018-11-26 ENCOUNTER — Ambulatory Visit: Payer: PPO | Admitting: Nurse Practitioner

## 2018-11-27 DIAGNOSIS — M25561 Pain in right knee: Secondary | ICD-10-CM | POA: Diagnosis not present

## 2018-11-27 DIAGNOSIS — M1711 Unilateral primary osteoarthritis, right knee: Secondary | ICD-10-CM | POA: Diagnosis not present

## 2018-11-28 ENCOUNTER — Encounter: Payer: Self-pay | Admitting: Nurse Practitioner

## 2018-11-28 ENCOUNTER — Ambulatory Visit: Payer: PPO | Admitting: Nurse Practitioner

## 2018-11-28 ENCOUNTER — Encounter: Payer: Self-pay | Admitting: Family Medicine

## 2018-11-28 VITALS — BP 120/80 | HR 98 | Ht 65.5 in | Wt 139.0 lb

## 2018-11-28 DIAGNOSIS — R1310 Dysphagia, unspecified: Secondary | ICD-10-CM | POA: Diagnosis not present

## 2018-11-28 DIAGNOSIS — R1319 Other dysphagia: Secondary | ICD-10-CM

## 2018-11-28 NOTE — Patient Instructions (Signed)
Take Benefiber daily.  Call us at [phone] if recurrent swallowing problems.   Normal BMI (Body Mass Index- based on height and weight) is between 23 and 30. Your BMI today is Body mass index is 22.78 kg/m. [name] Please consider follow up  regarding your BMI with your Primary Care Provider.

## 2018-11-28 NOTE — [place] Notes (Addendum)
Chief Complaint:    Dysphagia   IMPRESSION and PLAN:    78 yo female with situational dysphagia.  Tends to have problems swallowing when simultaneously eating and engaging in conversation.  Assures me there are no problems swallowing when dining alone.  Because of some bone loss in her jaw patient feels that she looks awkward when swallowing.  This causes some social anxiety adding to her swallowing problems.  -Patient seems to have already figured out her problems surrounding dysphagia.  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  I asked her to pay close attention, if dysphagia is occurring when when eating alone then we should be notified ASAP -Regarding the anxiety she has around eating/chewing in front of others.  We talked about this for a while.  She tries to order soft food when in a restaurant.  I recommended she eat prior to going out and then just eat very small bites/small amount of food just to be social    Agree with Ms. Vanita Ingles assessment and plan but patient should call back for any worsening of dysphagia from this level of dysphagia - I suspect that she most likely has significant strictuturing based upon her hx  Gatha Mayer, MD, [facility]    HPI:     Patient is a 78 yo female known to Dr. [name]. She has a hx of eosinophilic esophagitis with stricturing and is s/p EGD with dilation of ringed esophagus in July 2018. On daily PPI.  I saw her the end of November 2019 for evaluation of iron deficiency anemia and positive IFOBT.  She underwent colonoscopy with findings of multiple adenomatous colon polyps as well as colonic AVMs.  Patient has a history of breast cancer, followed by Dr.Feng who is also managing her oral iron. She has been on iron for ~ 2.5 months. She is due to have follow up labs with Dr. Burr Medico soon.  Our plan was to repeat colonoscopy with treatment of AVMs if anemia persisted or she had  overt bleeding   Patient is actually here today to talk about dysphagia though she has already figured out the problems.  She tends to have swallowing difficulty when simultaneously eating and participating in conversation. She tends to put more food in mouth before swallowing what is already in her mouth. In the process of doing this she swallows larger, unmasticated food. Absolutely no dysphagia otherwise.  She swallows pills in applesauce or yogurt and this works well  Patient has had bone loss in her face.  Due to this feels like she looks awkward when swallowing.  This has made her extremely uncomfortable eating in front of others.  It disrupts her social life to some degree.   Review of systems:     No chest pain, no SOB, no fevers, no urinary sx   Past Medical History:  Diagnosis Date  . Abdominal pain 08/11/2017  . ALKALINE PHOSPHATASE, ELEVATED 03/15/2009  . Allergic state 06/10/2012  . Anemia   . Anxiety   . Anxiety and depression 04/28/2011  . Arthritis   . Atypical chest pain 11/30/2011  . 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 Baker's cyst of knee 05/22/2011  . Cancer ([place]) 01,  08   XRT/chemo 01-02/ lobular invasive ca  . Carotid artery disease ([place])    a. Carotid duplex 03/2014: stable 1-39% BICA, f/u due 03/2016.  [name] Chronic alcoholism in  remission ([place]) 03/29/2011   Did not tolerate Klonopin, caused some confusion and bad dreams.    [name] COPD (chronic obstructive pulmonary disease) ([place])   . Dermatitis 11/23/2012  . Dysphagia   . EE (eosinophilic esophagitis)   . Elevated sed rate 08/02/2013  . Esophageal ring   . ESOPHAGEAL STRICTURE 03/29/2009  . Fall 11/23/2012  . Family history of breast cancer   . Family history of colon cancer   . Family history of ovarian cancer   . Family history of pancreatic cancer   . Folliculitis of nose 2/33/0076  . GERD (gastroesophageal reflux disease) 09/29/2009   improved s/p cholecystectomy and esophagus dilatation  .  Hematuria   . History of chicken pox   . History of measles   . History of shingles    2 episodes  . Hx of echocardiogram    a. Echo 01/2013: mild LVH, EF 55-60%, normal wall motion, Gr 1 diast dysfn  . Hyperlipidemia   . Knee pain, bilateral 07/23/2011  . Medicare annual wellness visit, subsequent 06/19/2015  . Mixed hyperlipidemia 10/17/2010   Qualifier: Diagnosis of  By: Mack Guise    . Orthostasis   . Osteopenia 03/14/2011  . Personal history of chemotherapy 2001  . Personal history of radiation therapy 2001   rt breast  . 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 Pneumonia 2-10   pleurisy  . PVC's (premature ventricular contractions)    a. Event monitor 01/2013: NSR, extensive PVCs.  . Radial neck fracture 10/2011   minimally displaced  . Sinusitis, acute 12/04/2015  . Trauma 10/18/2011  . URI (upper respiratory infection) 09/02/2012  . Urinary frequency 12/12/2014  . Urinary incontinence 03/19/2012  . Vaginitis 05/22/2011    Patient's surgical history, family medical history, social history, medications and allergies were all reviewed in Epic   Creatinine clearance cannot be calculated (Patient's most recent lab result is older than the maximum 21 days allowed.)  Current Outpatient Medications  Medication Sig Dispense Refill  . acetaminophen (TYLENOL) 500 MG tablet Take 500-1,000 mg by mouth every 6 (six) hours as needed (for pain.).    [name] amitriptyline (ELAVIL) 25 MG tablet Take 6 tablets (150 mg total) by mouth at bedtime. 180 tablet 1  . aspirin EC 81 MG tablet Take 81 mg by mouth daily.    [name] atorvastatin (LIPITOR) 20 MG tablet Take 2 tablets (40 mg total) by mouth daily at 6 PM. 180 tablet 3  . Biotin (BIOTIN 5000) 5 MG CAPS Take by mouth.    . Cholecalciferol (VITAMIN D3) 1000 UNITS CAPS Take 2,000 Units by mouth daily.     . Cyanocobalamin (VITAMIN B 12 PO) Take 1 tablet by mouth daily.    [name] diltiazem (CARDIZEM) 30 MG tablet TAKE 1 TABLET BY MOUTH ONCE DAILY FOR  PALPITATIONS 90 tablet 3  . Ferrous Fumarate-Folic Acid 226-3 MG TABS Take 1 tablet by mouth daily. 30 each 3  . loratadine (CLARITIN) 10 MG tablet Take 1 tablet (10 mg total) by mouth daily. 30 tablet 11  . LORazepam (ATIVAN) 1 MG tablet Take 1 tablet (1 mg total) by mouth every 8 (eight) hours as needed. for anxiety 70 tablet 1  . pantoprazole (PROTONIX) 40 MG tablet Take 1 tablet (40 mg total) by mouth daily. 90 tablet 1  . triamcinolone cream (KENALOG) 0.1 % Apply topically 2 (two) times daily. Apply to affected area 45 g 1   No current facility-administered medications for this visit.  Physical Exam:     BP 120/80   Pulse 98   Ht 5' 5.5" (1.664 m)   Wt 139 lb (63 kg)   BMI 22.78 kg/m   GENERAL:  Pleasant female in NAD ENT: No obvious neck masses.  No lesions or exudate in oral cavity PSYCH: : Cooperative, normal affect NEURO: Alert and oriented x 3, no focal neurologic deficits  I spent 25 minutes of face-to-face time with the patient. Greater than 50% of the time was spent counseling and coordinating care. Questions answered  Tye Savoy , NP 11/28/2018, 11:57 AM

## 2018-12-01 DIAGNOSIS — H353131 Nonexudative age-related macular degeneration, bilateral, early dry stage: Secondary | ICD-10-CM | POA: Diagnosis not present

## 2018-12-01 DIAGNOSIS — H353211 Exudative age-related macular degeneration, right eye, with active choroidal neovascularization: Secondary | ICD-10-CM | POA: Diagnosis not present

## 2018-12-01 DIAGNOSIS — H43391 Other vitreous opacities, right eye: Secondary | ICD-10-CM | POA: Diagnosis not present

## 2018-12-02 NOTE — Telephone Encounter (Signed)
Collaborative reports telephone encounter with patient who 'denies need for lab at this time.  Will monitor and call if needed".

## 2018-12-04 ENCOUNTER — Encounter: Payer: Self-pay | Admitting: Hematology

## 2018-12-05 ENCOUNTER — Inpatient Hospital Stay: Payer: PPO | Attending: Hematology

## 2018-12-05 DIAGNOSIS — C792 Secondary malignant neoplasm of skin: Secondary | ICD-10-CM | POA: Diagnosis not present

## 2018-12-05 DIAGNOSIS — M8589 Other specified disorders of bone density and structure, multiple sites: Secondary | ICD-10-CM | POA: Insufficient documentation

## 2018-12-05 DIAGNOSIS — R42 Dizziness and giddiness: Secondary | ICD-10-CM | POA: Insufficient documentation

## 2018-12-05 DIAGNOSIS — C50911 Malignant neoplasm of unspecified site of right female breast: Secondary | ICD-10-CM | POA: Insufficient documentation

## 2018-12-05 DIAGNOSIS — Z923 Personal history of irradiation: Secondary | ICD-10-CM | POA: Diagnosis not present

## 2018-12-05 DIAGNOSIS — Z7982 Long term (current) use of aspirin: Secondary | ICD-10-CM | POA: Insufficient documentation

## 2018-12-05 DIAGNOSIS — D649 Anemia, unspecified: Secondary | ICD-10-CM

## 2018-12-05 DIAGNOSIS — Z9011 Acquired absence of right breast and nipple: Secondary | ICD-10-CM | POA: Insufficient documentation

## 2018-12-05 DIAGNOSIS — Z8041 Family history of malignant neoplasm of ovary: Secondary | ICD-10-CM | POA: Insufficient documentation

## 2018-12-05 DIAGNOSIS — Z79899 Other long term (current) drug therapy: Secondary | ICD-10-CM | POA: Diagnosis not present

## 2018-12-05 DIAGNOSIS — Z800 Family history of malignant neoplasm of digestive organs: Secondary | ICD-10-CM | POA: Insufficient documentation

## 2018-12-05 DIAGNOSIS — D509 Iron deficiency anemia, unspecified: Secondary | ICD-10-CM | POA: Insufficient documentation

## 2018-12-05 DIAGNOSIS — Z9221 Personal history of antineoplastic chemotherapy: Secondary | ICD-10-CM | POA: Insufficient documentation

## 2018-12-05 DIAGNOSIS — Z803 Family history of malignant neoplasm of breast: Secondary | ICD-10-CM | POA: Insufficient documentation

## 2018-12-05 LAB — FERRITIN: Ferritin: 49 ng/mL (ref 11–307)

## 2018-12-05 LAB — CBC WITH DIFFERENTIAL (CANCER CENTER ONLY)
Abs Immature Granulocytes: 0.01 10*3/uL (ref 0.00–0.07)
Basophils Absolute: 0 10*3/uL (ref 0.0–0.1)
Basophils Relative: 1 %
EOS PCT: 2 %
Eosinophils Absolute: 0.2 10*3/uL (ref 0.0–0.5)
HEMATOCRIT: 39 % (ref 36.0–46.0)
Hemoglobin: 12.6 g/dL (ref 12.0–15.0)
Immature Granulocytes: 0 %
LYMPHS ABS: 1.8 10*3/uL (ref 0.7–4.0)
Lymphocytes Relative: 23 %
MCH: 29.3 pg (ref 26.0–34.0)
MCHC: 32.3 g/dL (ref 30.0–36.0)
MCV: 90.7 fL (ref 80.0–100.0)
Monocytes Absolute: 0.6 10*3/uL (ref 0.1–1.0)
Monocytes Relative: 8 %
Neutro Abs: 5 10*3/uL (ref 1.7–7.7)
Neutrophils Relative %: 66 %
Platelet Count: 286 10*3/uL (ref 150–400)
RBC: 4.3 MIL/uL (ref 3.87–5.11)
RDW: 16.3 % — AB (ref 11.5–15.5)
WBC Count: 7.6 10*3/uL (ref 4.0–10.5)
nRBC: 0 % (ref 0.0–0.2)

## 2018-12-05 NOTE — [facility] Notes (Signed)
Anne [name]   [phone] [phone]   Clinic Follow up Note   Patient Care Team: [name], MD as PCP - General (Family Medicine) Paralee Cancel, MD as Consulting Physician (Orthopedic Surgery) Fay Records, MD as Consulting Physician (Cardiology) Gatha Mayer, MD as Consulting Physician (Gastroenterology) Clent Jacks, MD as Consulting Physician (Ophthalmology) Roney Jaffe, DDS (Oral Surgery) Everlene Other (Dentistry) [place], Jennefer Bravo, MD as Referring Physician (Dermatology) Dillingham, Loel Lofty, DO as Attending Physician (Plastic Surgery) Truitt Merle, MD as Consulting Physician (Hematology)  Date of Service:  12/08/2018  CHIEF COMPLAINT: F/u of recurrent right breast cancer  SUMMARY OF ONCOLOGIC HISTORY:   Breast cancer, right breast ([place])   01/2000 Initial Diagnosis    Breast cancer, right breast ([place])  History of primary lobular right breast carcinoma. 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 I believe that she had local radiation, tho that information is not available in this EMR.     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 2008 (tho patient did not agree to surgery until July 2008), invasive lobular carcinoma ER 81%, PR 96%, Her 2 1+ ([id number]) , 1.5 cm invasive lobular with 3 axillary nodes negative, LVSI and perineural invasion present ([id number]). She subsequently declined adjuvant systemic treatment . She had a complicated course following right mastectomy, with difficult healing after right breast reconstruction.     07/2009  Relapse/Recurrence    She had skin recurrence lateral right chest wall excised in Sept 2010 then treated with xeloda from Oct 2010 thru Feb 2011 and began on Arimidex July 2011; she may have stopped Arimidex after bone density scan 02-2014, or possibly the year prior (?).     2015 Imaging    Bone density scan [facility] 02-15-14 still osteopenic range, slightly lower in LS compared with 2013 and stable in hips.      CURRENT THERAPY:  Surveillance   INTERVAL HISTORY:  Anne [name] is here for a follow up of recurrent right breast cancer. She presents to the clinic today with her husband. She notes she is doing well. She notes she feels more energy than before. She notes after siting for a while she will get up and start to walk and will be lightheaded and blurred vision. She notes having this for years but was worsened with anemia. She notes her BP is normal, with occasional orthostatic hypotension. She notes she does not get to 40 ounces of water a day. She notes she saw Dr. Marla Roe for      REVIEW OF SYSTEMS:   Constitutional: Denies fevers, chills or abnormal weight loss (+) Occasional lightheadedness Eyes: (+) Occasional blurriness of vision Ears, nose, mouth, throat, and face: Denies mucositis or sore throat Respiratory: Denies cough, dyspnea or wheezes Cardiovascular: Denies palpitation, chest discomfort or lower extremity swelling Gastrointestinal:  Denies nausea, heartburn or change in bowel habits Skin: Denies abnormal skin rashes Lymphatics: Denies new lymphadenopathy or easy bruising Neurological:Denies numbness, tingling or new weaknesses Behavioral/Psych: Mood is stable, no new changes  All other systems were reviewed with the patient and are negative.  MEDICAL HISTORY:  Past Medical History:  Diagnosis Date  . Abdominal pain 08/11/2017  . ALKALINE PHOSPHATASE, ELEVATED 03/15/2009  . Allergic state 06/10/2012  . Anemia   . Anxiety   . Anxiety and depression 04/28/2011  .  Arthritis   . Atypical chest pain 11/30/2011  . 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 Baker's cyst of knee 05/22/2011  . Cancer ([place]) 01,  08   XRT/chemo 01-02/ lobular invasive ca  . Carotid artery disease ([place])    a. Carotid duplex 03/2014: stable 1-39% BICA, f/u due 03/2016.  [name] Chronic alcoholism in remission ([place]) 03/29/2011   Did not tolerate Klonopin, caused some confusion and bad dreams.    [name] COPD (chronic obstructive pulmonary disease) ([place])   . Dermatitis 11/23/2012  . Dysphagia   . EE (eosinophilic esophagitis)   . Elevated sed rate 08/02/2013  . Esophageal ring   . ESOPHAGEAL STRICTURE 03/29/2009  . Fall 11/23/2012  . Family history of breast cancer   . Family history of colon cancer   . Family history of ovarian cancer   . Family history of pancreatic cancer   . Folliculitis of nose 3/61/4431  . GERD (gastroesophageal reflux disease) 09/29/2009   improved s/p cholecystectomy and esophagus dilatation  . Hematuria   . History of chicken pox   . History of measles   . History of shingles    2 episodes  . Hx of echocardiogram    a. Echo 01/2013: mild LVH, EF 55-60%, normal wall motion, Gr 1 diast dysfn  . Hyperlipidemia   . Knee pain, bilateral 07/23/2011  . Medicare annual wellness visit, subsequent 06/19/2015  . Mixed hyperlipidemia 10/17/2010   Qualifier: Diagnosis of  By: Mack Guise    . Orthostasis   . Osteopenia 03/14/2011  . Personal history of chemotherapy 2001  . Personal history of radiation therapy 2001   rt breast  . 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 Pneumonia 2-10   pleurisy  . PVC's (premature ventricular contractions)    a. Event monitor 01/2013: NSR, extensive PVCs.  . Radial neck fracture 10/2011   minimally displaced  . Sinusitis, acute 12/04/2015  . Trauma 10/18/2011  . URI (upper respiratory infection) 09/02/2012  . Urinary frequency 12/12/2014  . Urinary incontinence 03/19/2012  . Vaginitis 05/22/2011    SURGICAL  HISTORY: Past Surgical History:  Procedure Laterality Date  . AUGMENTATION MAMMAPLASTY Right 05/27/2007  . BREAST BIOPSY Right 01/02/2007   wire loc  . BREAST BIOPSY  12/26/2006  . BREAST LUMPECTOMY Right 2001  . BREAST RECONSTRUCTION  2008, 2009, 2010  . BREAST REDUCTION WITH MASTOPEXY Left 05/30/2017   Procedure: LEFT BREAST REDUCTION FOR SYMTERY WITH MASTOPEXY;  Surgeon: Wallace Going, DO;  Location: [place];  Service: Plastics;  Laterality: Left;  . CHOLECYSTECTOMY  2010  . COLONOSCOPY  09/05/10   cecal avm's  . DENTAL SURGERY  05/2016   4 dental implants by Dr. Loyal Gambler.  [name] ERCP  2010    CBD stone extraction   . ESOPHAGOGASTRODUODENOSCOPY  01/08/2012   Procedure: ESOPHAGOGASTRODUODENOSCOPY (EGD);  Surgeon: Gatha Mayer, MD;  Location: [facility];  Service: Endoscopy;  Laterality: N/A;  . ESOPHAGOGASTRODUODENOSCOPY (EGD) WITH ESOPHAGEAL DILATION  2010, 2012  . LAPAROSCOPIC APPENDECTOMY N/A 08/04/2016   Procedure: APPENDECTOMY LAPAROSCOPIC;  Surgeon: Michael Boston, MD;  Location: [facility];  Service: General;  Laterality: N/A;  . LIPOSUCTION WITH LIPOFILLING Left 11/21/2017   Procedure: LIPOSUCTION FROM ABDOMEN WITH LIPOFILLING TO LEFT  BREAST;  Surgeon: Wallace Going, DO;  Location: [place];  Service: Plastics;  Laterality: Left;  [name] MASTECTOMY MODIFIED RADICAL Right 05/27/2007   , Mastectomy modified radical (08), breast reconstruction, CA lesions excised lateral abd wall 2010  . MASTOPEXY Left 11/21/2017   Procedure: LEFT BREAST REVISION MASTOPEXY FOR SYMMETRY;  Surgeon: Wallace Going, DO;  Location: [place];  Service: Plastics;  Laterality: Left;  . REDUCTION MAMMAPLASTY Left   . SAVORY DILATION  01/08/2012   Procedure: SAVORY DILATION;  Surgeon: Gatha Mayer, MD;  Location: [facility];  Service: Endoscopy;  Laterality: N/A;  need xray    I have reviewed the social history and family history with the patient  and they are unchanged from previous note.  ALLERGIES:  is allergic to codeine; morphine; peanut-containing drug products; sorbitol; tomato; zofran; advil [ibuprofen]; diphenhydramine hcl; and oxycodone.  MEDICATIONS:  Current Outpatient Medications  Medication Sig Dispense Refill  . acetaminophen (TYLENOL) 500 MG tablet Take 500-1,000 mg by mouth every 6 (six) hours as needed (for pain.).    [name] amitriptyline (ELAVIL) 25 MG tablet Take 6 tablets (150 mg total) by mouth at bedtime. 180 tablet 1  . aspirin EC 81 MG tablet Take 81 mg by mouth daily.    [name] atorvastatin (LIPITOR) 20 MG tablet Take 2 tablets (40 mg total) by mouth daily at 6 PM. 180 tablet 3  . Biotin (BIOTIN 5000) 5 MG CAPS Take by mouth.    . Cholecalciferol (VITAMIN D3) 1000 UNITS CAPS Take 2,000 Units by mouth daily.     . Cyanocobalamin (VITAMIN B 12 PO) Take 1 tablet by mouth daily.    [name] diltiazem (CARDIZEM) 30 MG tablet TAKE 1 TABLET BY MOUTH ONCE DAILY FOR PALPITATIONS 90 tablet 3  . Ferrous Fumarate-Folic Acid 315-1 MG TABS Take 1 tablet by mouth daily. 30 each 3  . loratadine (CLARITIN) 10 MG tablet Take 1 tablet (10 mg total) by mouth daily. 30 tablet 11  . LORazepam (ATIVAN) 1 MG tablet Take 1 tablet (1 mg total) by mouth every 8 (eight) hours as needed. for anxiety 70 tablet 1  . pantoprazole (PROTONIX) 40 MG tablet Take 1 tablet (40 mg total) by mouth daily. 90 tablet 1  . triamcinolone cream (KENALOG) 0.1 % Apply topically 2 (two) times daily. Apply to affected area 45 g 1   No current facility-administered medications for this visit.     PHYSICAL EXAMINATION: ECOG PERFORMANCE STATUS: 0 - Asymptomatic  Vitals:   12/08/18 1403  BP: 122/80  Pulse: 98  Resp: 18  Temp: 98.4 F (36.9 C)  SpO2: 97%   Filed Weights   12/08/18 1403  Weight: 138 lb 8 oz (62.8 kg)    GENERAL:alert, no distress and comfortable SKIN: skin color, texture, turgor are normal, no rashes or significant lesions EYES: normal,  Conjunctiva are pink and non-injected, sclera clear OROPHARYNX:no exudate, no erythema and lips, buccal mucosa, and tongue normal  NECK: supple, thyroid normal size, non-tender, without nodularity LYMPH:  no palpable lymphadenopathy in the cervical, axillary or inguinal LUNGS: clear to auscultation and percussion with normal breathing effort HEART: regular rate & rhythm and no murmurs and no lower extremity edema ABDOMEN:abdomen soft, non-tender and normal bowel sounds Musculoskeletal:no cyanosis of digits and no clubbing  NEURO: alert & oriented x 3 with fluent speech, no focal motor/sensory deficits BREAST: S/p right breast mastectomy: surgical incisions healed well (+) tiny Skin tag in inner of left breast (+)  no palpable mass or adenopathy   LABORATORY DATA:  I have reviewed the data as listed CBC Latest Ref Rng & Units 12/05/2018 10/31/2018 10/07/2018  WBC 4.0 - 10.5 K/uL 7.6 7.0 7.6  Hemoglobin 12.0 - 15.0 g/dL 12.6 11.0(L) 11.3(L)  Hematocrit 36.0 - 46.0 % 39.0 35.1(L) 35.1(L)  Platelets 150 - 400 K/uL 286 309 275.0     CMP Latest Ref Rng & Units 10/31/2018 09/10/2018 09/02/2018  Glucose 70 - 99 mg/dL 103(H) 55(L) 82  BUN 8 - 23 mg/dL 14 15 20   Creatinine 0.44 - 1.00 mg/dL 0.72 0.84 0.75  Sodium 135 - 145 mmol/L 142 144 139  Potassium 3.5 - 5.1 mmol/L 3.8 4.0 4.5  Chloride 98 - 111 mmol/L 104 105 102  CO2 22 - 32 mmol/L 28 31 32  Calcium 8.9 - 10.3 mg/dL 9.2 9.7 9.5  Total Protein 6.5 - 8.1 g/dL 7.0 8.2(H) 7.0  Total Bilirubin 0.3 - 1.2 mg/dL 0.3 0.4 0.4  Alkaline Phos 38 - 126 U/L 125 138(H) 113  AST 15 - 41 U/L 17 20 14   ALT 0 - 44 U/L 18 18 15    PROCEDURES  Colonoscopy by Dr. Havery Moros 10/18/19  IMPRESSION - The examined portion of the ileum was normal. - Seven colonic angiodysplastic lesions. - One 8 mm polyp in the cecum, removed with a cold snare. Resected and retrieved. - One 4 mm polyp in the transverse colon, removed with a cold snare. Resected  and retrieved. - Redundant colon. - Lipoma in the transverse colon. - The examination was otherwise normal. Overall, suspect AVMs are the cause of the anemia / FIT test. All polyps were benign tubular adenomas.    RADIOGRAPHIC STUDIES: I have personally reviewed the radiological images as listed and agreed with the findings in the report. No results found.   ASSESSMENT & PLAN:  Anne [name] is a 78 y.o. female with   1. History of recurrent right breast lobular carcinoma, in 2001, 2008 and chest wall recurrence in 2010 -She was initially diagnosed in 01/2000 but unfortunately had recurrence twice, the last time involving the chest wall in 2010.  -At that time she was treated with Xeloda and Exemestane 2011-2015.  -She is clinically doing well. Lab reviewed from last week/ Her physical exam and her 03/3028 Left breast mammogram were unremarkable. There is no clinical concern for recurrence. -I discussed eating and drinking adequately with iron and protein rich diet,along with use of prenatal vitamin and remaining physically active.  -F/u in 6 months    2. Osteopenia  -Her 06/2018 DEXA shows osteopenia with lowest T-score of -2.3 at AP spine which has progressed since last scan.  -she will continue calcium and vitD    3. Anemia of iron deficiency, secondary to AVM  -she has developed new anemia in 09/2018 with lab evidence of iron deficiency. -Her prior colonoscopy in 2014 showed AVM in her Cecum, which may contribute to her anemia.  -repeat colonoscopy from 10/2018 showed evidence of AVM which is the cause of her anemia. She also had several polyps found which were all benign.  -I discussed she may have recurrent bleeding with AVM which can cause recurrence anemia. Will monitor.  -She responded well to oral iron, anemia has resolved.  I reviewed her recent lab results. -Continue oral iron 1-2 times a day. Latest iron panel is normal, anemia currently resolved.  -I encouraged her  to watch for loose black or bloody stool. If present she should contact my  office.    4. Dizziness  -Her BP is mostly normal but has episodes of orthostatic hypotension with lightheadedness and blurred vision.  -I encouraged her to drink fluids adequately, and add salt to her diet, and follow-up with her PCP. -she is on Cardizem for her history of tachycardia.      PLAN:  -Lab and f/u in 6 months  -Lab in 3 months to f/u anemia and iron study     No problem-specific Assessment & Plan notes found for this encounter.   No orders of the defined types were placed in this encounter.  All questions were answered. The patient knows to call the clinic with any problems, questions or concerns. No barriers to learning was detected. I spent 20 minutes counseling the patient face to face. The total time spent in the appointment was 25 minutes and more than 50% was on counseling and review of test results     Truitt Merle, MD 12/08/2018   I, Joslyn Devon, am acting as scribe for Truitt Merle, MD. 20 I have reviewed the above documentation for accuracy and completeness, and I agree with the above.

## 2018-12-08 ENCOUNTER — Telehealth: Payer: Self-pay

## 2018-12-08 ENCOUNTER — Inpatient Hospital Stay (HOSPITAL_BASED_OUTPATIENT_CLINIC_OR_DEPARTMENT_OTHER): Payer: PPO | Admitting: Hematology

## 2018-12-08 VITALS — BP 122/80 | HR 98 | Temp 98.4°F | Resp 18 | Ht 65.5 in | Wt 138.5 lb

## 2018-12-08 DIAGNOSIS — Z800 Family history of malignant neoplasm of digestive organs: Secondary | ICD-10-CM

## 2018-12-08 DIAGNOSIS — C792 Secondary malignant neoplasm of skin: Secondary | ICD-10-CM

## 2018-12-08 DIAGNOSIS — Z803 Family history of malignant neoplasm of breast: Secondary | ICD-10-CM | POA: Diagnosis not present

## 2018-12-08 DIAGNOSIS — Z8041 Family history of malignant neoplasm of ovary: Secondary | ICD-10-CM

## 2018-12-08 DIAGNOSIS — Z9011 Acquired absence of right breast and nipple: Secondary | ICD-10-CM | POA: Diagnosis not present

## 2018-12-08 DIAGNOSIS — Z79899 Other long term (current) drug therapy: Secondary | ICD-10-CM

## 2018-12-08 DIAGNOSIS — Z923 Personal history of irradiation: Secondary | ICD-10-CM

## 2018-12-08 DIAGNOSIS — Z7982 Long term (current) use of aspirin: Secondary | ICD-10-CM

## 2018-12-08 DIAGNOSIS — D509 Iron deficiency anemia, unspecified: Secondary | ICD-10-CM | POA: Diagnosis not present

## 2018-12-08 DIAGNOSIS — R42 Dizziness and giddiness: Secondary | ICD-10-CM

## 2018-12-08 DIAGNOSIS — Z9221 Personal history of antineoplastic chemotherapy: Secondary | ICD-10-CM

## 2018-12-08 DIAGNOSIS — M8589 Other specified disorders of bone density and structure, multiple sites: Secondary | ICD-10-CM

## 2018-12-08 DIAGNOSIS — Z853 Personal history of malignant neoplasm of breast: Secondary | ICD-10-CM

## 2018-12-08 DIAGNOSIS — C50911 Malignant neoplasm of unspecified site of right female breast: Secondary | ICD-10-CM | POA: Diagnosis not present

## 2018-12-08 NOTE — Telephone Encounter (Signed)
Printed avs and calender of upcoming appointment. Per 1/27 los 

## 2018-12-09 ENCOUNTER — Encounter: Payer: Self-pay | Admitting: Pulmonary Disease

## 2018-12-09 ENCOUNTER — Encounter: Payer: Self-pay | Admitting: Hematology

## 2018-12-09 ENCOUNTER — Ambulatory Visit: Payer: PPO | Admitting: Pulmonary Disease

## 2018-12-09 VITALS — BP 112/62 | HR 99 | Ht 65.5 in | Wt 138.6 lb

## 2018-12-09 DIAGNOSIS — J439 Emphysema, unspecified: Secondary | ICD-10-CM | POA: Diagnosis not present

## 2018-12-09 DIAGNOSIS — R0602 Shortness of breath: Secondary | ICD-10-CM | POA: Diagnosis not present

## 2018-12-09 NOTE — Patient Instructions (Signed)
I am glad you are doing better with respect to your breathing We will continue to observe you off inhalers Follow-up in 6 months. Please give [place] a call sooner if there is any change in your symptoms.

## 2018-12-09 NOTE — Progress Notes (Signed)
Anne [name]    [id number]    26-Dec-1940  Primary Care Physician:[name], [name], MD  Referring Physician: [name], MD [address] [place], [place] [address]  Chief complaint: Follow-up for emphysema, COPD   HPI: 78 year old with history of breast cancer, emphysema, eosinophilic esophagitis, palpitations, hyperlipidemia Complains of progressive dyspnea on exertion for the past 2 years.  She does not have symptoms at rest.  Denies any cough, sputum production, wheezing.  She is on albuterol which she is using a couple of times per week.  She has significant seasonal allergies, notes episodes of increasing dyspnea, tightness after working in her yard.  She also has acid reflux, eosinophilic esophagitis and follows with Dr. [name].  She is on budesonide, PPI therapy for this. Started on Symbicort at last visit in June 2018.  She stopped using it after few weeks as it made her hoarse.   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 Smoking history: 50-70-pack-year smoking history.  Quit in 2008. Travel history: No significant travel.  Interim history: Breathing is doing well.  She continues on iron therapy with improvement in ferritin levels and hemoglobin States that this has improved her breathing more than any other inhaler She has stopped using the Breo without any worsening of breathing.  Outpatient Encounter Medications as of 12/09/2018  Medication Sig  . acetaminophen (TYLENOL) 500 MG tablet Take 500-1,000 mg by mouth every 6 (six) hours as needed (for pain.).  [name] amitriptyline (ELAVIL) 25 MG tablet Take 6 tablets (150 mg total) by mouth at bedtime.  [name] aspirin EC 81 MG tablet Take 81 mg by mouth daily.  [name] atorvastatin (LIPITOR)  20 MG tablet Take 2 tablets (40 mg total) by mouth daily at 6 PM.  . Biotin (BIOTIN 5000) 5 MG CAPS Take by mouth.  . Cholecalciferol (VITAMIN D3) 1000 UNITS CAPS Take 2,000 Units by mouth daily.   . Cyanocobalamin (VITAMIN B 12 PO) Take 1 tablet by mouth daily.  [name] diltiazem (CARDIZEM) 30 MG tablet TAKE 1 TABLET BY MOUTH ONCE DAILY FOR PALPITATIONS  . Ferrous Fumarate-Folic Acid 431-5 MG TABS Take 1 tablet by mouth daily.  [name] loratadine (CLARITIN) 10 MG tablet Take 1 tablet (10 mg total) by mouth daily.  [name] LORazepam (ATIVAN) 1 MG tablet Take 1 tablet (1 mg total) by mouth every 8 (eight) hours as needed. for anxiety  . pantoprazole (PROTONIX) 40 MG tablet Take 1 tablet (40 mg total) by mouth daily.  [name] triamcinolone cream (KENALOG) 0.1 % Apply topically 2 (two) times daily. Apply to affected area   No facility-administered encounter medications on file as of 12/09/2018.    Physical Exam: Blood pressure 112/62, pulse 99, height 5' 5.5" (1.664 m), weight 138 lb 9.6 oz (62.9 kg), SpO2 97 %. Gen:      No acute distress HEENT:  EOMI, sclera anicteric Neck:     No masses; no thyromegaly Lungs:    Clear to auscultation bilaterally; normal respiratory effort CV:         Regular rate and rhythm; no murmurs Abd:      + bowel sounds; soft, non-tender; no palpable masses, no distension Ext:    No edema; adequate peripheral perfusion  Skin:      Warm and dry; no rash Neuro: alert and oriented x 3 Psych: normal mood and affect  Data Reviewed: Imaging CT chest 07/03/2013- postradiation changes in the right upper lobe, centrilobular emphysema, nodular thickening of the pleura at the bases CT abdomen 08/10/17- visualized lung bases show minimal atelectasis, no interstitial lung disease. CT high-resolution 08/12/2018-moderate emphysema, mild radiation fibrosis in the apical right upper lobe, two-vessel coronary atherosclerosis. I reviewed the images personally.  Labs CBC 04/16/2018-WBC 8.6, eos 1.8%, absolute  eosinophil count 155 Allergy profile 05/06/1989-IgE 271, sensitive to dust mite.  Mild allergies to cat, dog, tree pollen  PFTs Spirometry  09/13/2009 FVC 1.65 (49%], FEV1 1.39 (55%], F/F 84  08/11/2018 FVC 2.3 [77%), FEV1 2.3 [100%), F/F 97 Normal test.  PFTs 09/09/2018 FVC 2.62 [1%), FEV1 2.07 [92%), F/F 79, TLC 56%, DLCO 48%, DLCO/VA 76%  FENO 08/11/2018-28  Assessment:  Emphysema, dyspnea Lung imaging reviewed with minimal scarring in the right upper lobe consistent with postradiation changes and moderate emphysema.  No evidence of interstitial lung disease. There are minimal obstructive changes on pulmonary function tests  Symptoms overall have improved over the past few months.  In retrospect her symptoms of dyspnea were likely secondary to iron deficiency anemia as inhalers have not made any difference with her breathing Suspect there is a component of deconditioning contributing to her symptoms Encouraged her to start an exercise program at home and stay active.  Plan/Recommendations: - Observe off inhalers. - Start exercise therapy  Marshell Garfinkel MD [facility] 12/09/2018, 12:16 PM  CC: [name], MD

## 2018-12-15 ENCOUNTER — Encounter: Payer: Self-pay | Admitting: Hematology

## 2018-12-21 ENCOUNTER — Other Ambulatory Visit: Payer: Self-pay | Admitting: Family Medicine

## 2018-12-21 DIAGNOSIS — S299XXA Unspecified injury of thorax, initial encounter: Secondary | ICD-10-CM

## 2018-12-22 NOTE — Telephone Encounter (Signed)
Requesting:Ativan  Contract:yes UDS:low risk next screen 06/22/18 Last OV:08/29/18 Next OV:not scheduled  Last Refill:09/01/18   #[id number] Database:   Please advise

## 2018-12-31 DIAGNOSIS — H353211 Exudative age-related macular degeneration, right eye, with active choroidal neovascularization: Secondary | ICD-10-CM | POA: Diagnosis not present

## 2018-12-31 DIAGNOSIS — H353131 Nonexudative age-related macular degeneration, bilateral, early dry stage: Secondary | ICD-10-CM | POA: Diagnosis not present

## 2018-12-31 DIAGNOSIS — Z961 Presence of intraocular lens: Secondary | ICD-10-CM | POA: Diagnosis not present

## 2018-12-31 DIAGNOSIS — H43811 Vitreous degeneration, right eye: Secondary | ICD-10-CM | POA: Diagnosis not present

## 2019-01-14 ENCOUNTER — Telehealth: Payer: Self-pay

## 2019-01-14 ENCOUNTER — Ambulatory Visit (INDEPENDENT_AMBULATORY_CARE_PROVIDER_SITE_OTHER): Payer: PPO | Admitting: Internal Medicine

## 2019-01-14 ENCOUNTER — Encounter: Payer: Self-pay | Admitting: Internal Medicine

## 2019-01-14 ENCOUNTER — Ambulatory Visit (HOSPITAL_BASED_OUTPATIENT_CLINIC_OR_DEPARTMENT_OTHER)
Admission: RE | Admit: 2019-01-14 | Discharge: 2019-01-14 | Disposition: A | Discharge status: Home / Self Care | Payer: PPO | Source: Ambulatory Visit | Attending: Internal Medicine | Admitting: Internal Medicine

## 2019-01-14 VITALS — BP 126/68 | HR 95 | Temp 98.5°F | Resp 16 | Ht 65.5 in | Wt 137.4 lb

## 2019-01-14 DIAGNOSIS — M25551 Pain in right hip: Secondary | ICD-10-CM | POA: Diagnosis not present

## 2019-01-14 DIAGNOSIS — M1611 Unilateral primary osteoarthritis, right hip: Secondary | ICD-10-CM | POA: Diagnosis not present

## 2019-01-14 MED ORDER — CYCLOBENZAPRINE HCL 10 MG PO TABS: 10.0000 mg | ORAL_TABLET | Freq: Two times a day (BID) | ORAL | 0 refills | Status: DC | PRN

## 2019-01-14 MED ORDER — PANTOPRAZOLE SODIUM 40 MG PO TBEC: 40.0000 mg | DELAYED_RELEASE_TABLET | Freq: Every day | ORAL | 1 refills | Status: DC

## 2019-01-14 NOTE — Patient Instructions (Addendum)
Go to the first floor and get the x-ray of your hip  Take Flexe ril, a muscle relaxant twice a day as needed.  Will cause drowsiness  Tylenol  500 mg OTC 2 tabs a day every 8 hours as needed for pain  Call if you have severe symptoms  We are referring you to Dr. Alvan Dame office

## 2019-01-14 NOTE — Progress Notes (Signed)
Pre visit review using our clinic review tool, if applicable. No additional management support is needed unless otherwise documented below in the visit note. 

## 2019-01-14 NOTE — Telephone Encounter (Signed)
PA initiated via Covermymeds; KEY: [id number]. Awaiting determination.

## 2019-01-14 NOTE — Progress Notes (Signed)
Subjective:    Patient ID: Anne [name], female    DOB: [birth year], 78 y.o.   MRN: [id number]  DOS:  01/14/2019 Type of visit - description: Acute, here with her husband Symptoms of started 3 days ago, she was walking and she suddenly had pain at the inner and proximal aspect of the R thigh, pain was intense, she almost fell but nevertheless continue her walk. Pain lasted approximately 30 minutes Yesterday it happened again, the pain was a little more intense and lasted a little more than 30 minutes. Symptoms today are mild. Pain increases with walking   Review of Systems Denies fever chills No rash No back pain No fall or injury.   Past Medical History:  Diagnosis Date  . Abdominal pain 08/11/2017  . ALKALINE PHOSPHATASE, ELEVATED 03/15/2009  . Allergic state 06/10/2012  . Anemia   . Anxiety   . Anxiety and depression 04/28/2011  . Arthritis   . Atypical chest pain 11/30/2011  . 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 Baker's cyst of knee 05/22/2011  . Cancer ([place]) 01,  08   XRT/chemo 01-02/ lobular invasive ca  . Carotid artery disease ([place])    a. Carotid duplex 03/2014: stable 1-39% BICA, f/u due 03/2016.  [name] Chronic alcoholism in remission ([place]) 03/29/2011   Did not tolerate Klonopin, caused some confusion and bad dreams.    [name] COPD (chronic obstructive pulmonary disease) ([place])   . Dermatitis 11/23/2012  . Dysphagia   . EE (eosinophilic esophagitis)   . Elevated sed rate 08/02/2013  . Esophageal ring   . ESOPHAGEAL STRICTURE 03/29/2009  . Fall 11/23/2012  . Family history of breast cancer   . Family history of colon cancer   . Family history of ovarian cancer   . Family history of pancreatic cancer   . Folliculitis of nose 07/28/3845  . GERD (gastroesophageal reflux disease) 09/29/2009   improved s/p cholecystectomy and esophagus dilatation  . Hematuria   . History of chicken pox   . History of measles   . History of shingles    2 episodes  . Hx of  echocardiogram    a. Echo 01/2013: mild LVH, EF 55-60%, normal wall motion, Gr 1 diast dysfn  . Hyperlipidemia   . Knee pain, bilateral 07/23/2011  . Medicare annual wellness visit, subsequent 06/19/2015  . Mixed hyperlipidemia 10/17/2010   Qualifier: Diagnosis of  By: Mack Guise    . Orthostasis   . Osteopenia 03/14/2011  . Personal history of chemotherapy 2001  . Personal history of radiation therapy 2001   rt breast  . 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 Pneumonia 2-10   pleurisy  . PVC's (premature ventricular contractions)    a. Event monitor 01/2013: NSR, extensive PVCs.  . Radial neck fracture 10/2011   minimally displaced  . Sinusitis, acute 12/04/2015  . Trauma 10/18/2011  . URI (upper respiratory infection) 09/02/2012  . Urinary frequency 12/12/2014  . Urinary incontinence 03/19/2012  . Vaginitis 05/22/2011    Past Surgical History:  Procedure Laterality Date  . AUGMENTATION MAMMAPLASTY Right 05/27/2007  . BREAST BIOPSY Right 01/02/2007   wire loc  . BREAST BIOPSY  12/26/2006  . BREAST LUMPECTOMY Right 2001  . BREAST RECONSTRUCTION  2008, 2009, 2010  . BREAST REDUCTION WITH MASTOPEXY Left 05/30/2017   Procedure: LEFT BREAST REDUCTION FOR SYMTERY WITH MASTOPEXY;  Surgeon: Wallace Going, DO;  Location: [facility];  Service: Plastics;  Laterality: Left;  .  CHOLECYSTECTOMY  2010  . COLONOSCOPY  09/05/10   cecal avm's  . DENTAL SURGERY  05/2016   4 dental implants by Dr. Loyal Gambler.  [name] ERCP  2010    CBD stone extraction   . ESOPHAGOGASTRODUODENOSCOPY  01/08/2012   Procedure: ESOPHAGOGASTRODUODENOSCOPY (EGD);  Surgeon: Gatha Mayer, MD;  Location: [facility];  Service: Endoscopy;  Laterality: N/A;  . ESOPHAGOGASTRODUODENOSCOPY (EGD) WITH ESOPHAGEAL DILATION  2010, 2012  . LAPAROSCOPIC APPENDECTOMY N/A 08/04/2016   Procedure: APPENDECTOMY LAPAROSCOPIC;  Surgeon: Michael Boston, MD;  Location: [facility];  Service: General;  Laterality: N/A;  . LIPOSUCTION  WITH LIPOFILLING Left 11/21/2017   Procedure: LIPOSUCTION FROM ABDOMEN WITH LIPOFILLING TO LEFT BREAST;  Surgeon: Wallace Going, DO;  Location: [facility];  Service: Plastics;  Laterality: Left;  [name] MASTECTOMY MODIFIED RADICAL Right 05/27/2007   , Mastectomy modified radical (08), breast reconstruction, CA lesions excised lateral abd wall 2010  . MASTOPEXY Left 11/21/2017   Procedure: LEFT BREAST REVISION MASTOPEXY FOR SYMMETRY;  Surgeon: Wallace Going, DO;  Location: [facility];  Service: Plastics;  Laterality: Left;  . REDUCTION MAMMAPLASTY Left   . SAVORY DILATION  01/08/2012   Procedure: SAVORY DILATION;  Surgeon: Gatha Mayer, MD;  Location: [facility];  Service: Endoscopy;  Laterality: N/A;  need xray    Social History   Socioeconomic History  . Marital status: Married    Spouse name: Sam  . Number of children: 3  . Years of education: Not on file  . Highest education level: Not on file  Occupational History  . Occupation: Social worker  Social Needs  . Financial resource strain: Not on file  . Food insecurity:    Worry: Not on file    Inability: Not on file  . Transportation needs:    Medical: Not on file    Non-medical: Not on file  Tobacco Use  . Smoking status: Former Smoker    Packs/day: 1.00    Years: 50.00    Pack years: 50.00    Types: Cigarettes    Last attempt to quit: 05/22/2007    Years since quitting: 11.6  . Smokeless tobacco: Never Used  Substance and Sexual Activity  . Alcohol use: Not Currently  . Drug use: No  . Sexual activity: Yes    Partners: Male    Comment: lives with husband,   Lifestyle  . Physical activity:    Days per week: Not on file    Minutes per session: Not on file  . Stress: Not on file  Relationships  . Social connections:    Talks on phone: Not on file    Gets together: Not on file    Attends religious service: Not on file    Active member of club or organization: Not on file     Attends meetings of clubs or organizations: Not on file    Relationship status: Not on file  . Intimate partner violence:    Fear of current or ex partner: Not on file    Emotionally abused: Not on file    Physically abused: Not on file    Forced sexual activity: Not on file  Other Topics Concern  . Not on file  Social History Narrative  . Not on file      Allergies as of 01/14/2019      Reactions   Codeine Other (See Comments)   "flu like symptoms"   Morphine Other (See Comments)   "flu like  symptoms"   Peanut-containing Drug Products Hives   Sorbitol Other (See Comments)   GI Issues   Tomato Diarrhea   Zofran Other (See Comments)   headache   Advil [ibuprofen] Other (See Comments)   Irritates throat.   Diphenhydramine Hcl Other (See Comments)   "nervous and upset"   Oxycodone Anxiety   Confusion with inability to think clearly      Medication List       Accurate as of January 14, 2019 11:59 PM. Always use your most recent med list.        acetaminophen 500 MG tablet Commonly known as:  TYLENOL Take 500-1,000 mg by mouth every 6 (six) hours as needed (for pain.).   amitriptyline 25 MG tablet Commonly known as:  ELAVIL Take 6 tablets (150 mg total) by mouth at bedtime.   aspirin EC 81 MG tablet Take 81 mg by mouth daily.   atorvastatin 20 MG tablet Commonly known as:  LIPITOR Take 2 tablets (40 mg total) by mouth daily at 6 PM.   BIOTIN 5000 5 MG Caps Generic drug:  Biotin Take by mouth.   cyclobenzaprine 10 MG tablet Commonly known as:  FLEXERIL Take 1 tablet (10 mg total) by mouth 2 (two) times daily as needed for muscle spasms.   diltiazem 30 MG tablet Commonly known as:  CARDIZEM TAKE 1 TABLET BY MOUTH ONCE DAILY FOR PALPITATIONS   Ferrous Fumarate-Folic Acid 671-2 MG Tabs Take 1 tablet by mouth daily.   loratadine 10 MG tablet Commonly known as:  CLARITIN Take 1 tablet (10 mg total) by mouth daily.   LORazepam 1 MG tablet Commonly known as:   ATIVAN TAKE 1 TABLET(1 MG) BY MOUTH EVERY 8 HOURS AS NEEDED FOR ANXIETY   pantoprazole 40 MG tablet Commonly known as:  PROTONIX Take 1 tablet (40 mg total) by mouth daily.   triamcinolone cream 0.1 % Commonly known as:  KENALOG Apply topically 2 (two) times daily. Apply to affected area   VITAMIN B 12 PO Take 1 tablet by mouth daily.   Vitamin D3 25 MCG (1000 UT) Caps Take 2,000 Units by mouth daily.           Objective:   Physical Exam Musculoskeletal:       Legs:    BP 126/68 (BP Location: Left Arm, Patient Position: Sitting, Cuff Size: Small)   Pulse 95   Temp 98.5 F (36.9 C) (Oral)   Resp 16   Ht 5' 5.5" (1.664 m)   Wt 137 lb 6 oz (62.3 kg)   SpO2 95%   BMI 22.51 kg/m  General:   Well developed, NAD, BMI noted. HEENT:  Normocephalic . Face symmetric, atraumatic MSK: Left hip: Normal range of motion. Right hip: Normal range of motion, slightly TTP at the trochanteric bursa. Lumbosacral spine: No TTP Vascular exam: No edema, normal pedal pulses bilaterally Skin: Not pale. Not jaundice Neurologic:  alert & oriented X3.  Speech normal, gait appropriate for age and unassisted DTR symmetric, straight leg test negative. Psych--  Cognition and judgment appear intact.  Cooperative with normal attention span and concentration.  Behavior appropriate. No anxious or depressed appearing.      Assessment     78 year old female, PMH includes GERD, IBS, Baker's cyst of the knee, breast cancer, history of EtOH, presents with  Right leg pain: Suspect MSK, probably related to the right hip, she has a history of DJD. Vascular and neurological exam is normal. Plan: Continue with Tylenol, add  a muscle relaxant, get a x-ray, refer him back to Ortho.  Call if not better.  She is allergic or intolerant to codeine, morphine, Advil.  Could consider a round of steroids if pain severe.

## 2019-01-15 NOTE — Telephone Encounter (Signed)
PA approved through 01/14/2020.

## 2019-01-17 ENCOUNTER — Other Ambulatory Visit: Payer: Self-pay | Admitting: Family Medicine

## 2019-01-20 ENCOUNTER — Encounter: Payer: Self-pay | Admitting: Hematology

## 2019-01-26 DIAGNOSIS — M25561 Pain in right knee: Secondary | ICD-10-CM | POA: Diagnosis not present

## 2019-01-26 DIAGNOSIS — M25551 Pain in right hip: Secondary | ICD-10-CM | POA: Diagnosis not present

## 2019-01-27 ENCOUNTER — Encounter: Payer: Self-pay | Admitting: Family Medicine

## 2019-02-21 ENCOUNTER — Encounter: Payer: Self-pay | Admitting: Family Medicine

## 2019-02-21 DIAGNOSIS — R5383 Other fatigue: Secondary | ICD-10-CM

## 2019-02-21 DIAGNOSIS — E782 Mixed hyperlipidemia: Secondary | ICD-10-CM

## 2019-02-21 DIAGNOSIS — R002 Palpitations: Secondary | ICD-10-CM

## 2019-02-21 DIAGNOSIS — R739 Hyperglycemia, unspecified: Secondary | ICD-10-CM

## 2019-02-23 ENCOUNTER — Other Ambulatory Visit: Payer: Self-pay | Admitting: Family Medicine

## 2019-03-11 ENCOUNTER — Other Ambulatory Visit: Payer: Self-pay

## 2019-03-11 ENCOUNTER — Inpatient Hospital Stay: Payer: PPO | Attending: Hematology

## 2019-03-11 DIAGNOSIS — C792 Secondary malignant neoplasm of skin: Secondary | ICD-10-CM | POA: Insufficient documentation

## 2019-03-11 DIAGNOSIS — D509 Iron deficiency anemia, unspecified: Secondary | ICD-10-CM | POA: Insufficient documentation

## 2019-03-11 DIAGNOSIS — D649 Anemia, unspecified: Secondary | ICD-10-CM

## 2019-03-11 DIAGNOSIS — C50911 Malignant neoplasm of unspecified site of right female breast: Secondary | ICD-10-CM | POA: Diagnosis not present

## 2019-03-11 LAB — IRON AND TIBC
Iron: 79 ug/dL (ref 41–142)
Saturation Ratios: 25 % (ref 21–57)
TIBC: 318 ug/dL (ref 236–444)
UIBC: 238 ug/dL (ref 120–384)

## 2019-03-11 LAB — CBC WITH DIFFERENTIAL (CANCER CENTER ONLY)
Abs Immature Granulocytes: 0.01 10*3/uL (ref 0.00–0.07)
Basophils Absolute: 0.1 10*3/uL (ref 0.0–0.1)
Basophils Relative: 1 %
Eosinophils Absolute: 0.1 10*3/uL (ref 0.0–0.5)
Eosinophils Relative: 2 %
HCT: 37.8 % (ref 36.0–46.0)
Hemoglobin: 11.9 g/dL — ABNORMAL LOW (ref 12.0–15.0)
Immature Granulocytes: 0 %
Lymphocytes Relative: 23 %
Lymphs Abs: 1.5 10*3/uL (ref 0.7–4.0)
MCH: 30.7 pg (ref 26.0–34.0)
MCHC: 31.5 g/dL (ref 30.0–36.0)
MCV: 97.7 fL (ref 80.0–100.0)
Monocytes Absolute: 0.5 10*3/uL (ref 0.1–1.0)
Monocytes Relative: 8 %
Neutro Abs: 4.4 10*3/uL (ref 1.7–7.7)
Neutrophils Relative %: 66 %
Platelet Count: 252 10*3/uL (ref 150–400)
RBC: 3.87 MIL/uL (ref 3.87–5.11)
RDW: 13.8 % (ref 11.5–15.5)
WBC Count: 6.6 10*3/uL (ref 4.0–10.5)
nRBC: 0 % (ref 0.0–0.2)

## 2019-03-11 LAB — FERRITIN: Ferritin: 29 ng/mL (ref 11–307)

## 2019-03-12 ENCOUNTER — Encounter: Payer: Self-pay | Admitting: Family Medicine

## 2019-03-13 NOTE — Telephone Encounter (Signed)
Please advise, I ordered lab work for her and sent her a message, She did not get the lab work done here

## 2019-03-14 ENCOUNTER — Encounter: Payer: Self-pay | Admitting: Family Medicine

## 2019-03-18 ENCOUNTER — Other Ambulatory Visit: Payer: Self-pay | Admitting: Family Medicine

## 2019-03-18 ENCOUNTER — Ambulatory Visit: Payer: PPO | Admitting: *Deleted

## 2019-03-24 ENCOUNTER — Encounter: Payer: Self-pay | Admitting: Family Medicine

## 2019-03-25 ENCOUNTER — Other Ambulatory Visit: Payer: Self-pay | Admitting: Family Medicine

## 2019-03-25 DIAGNOSIS — S299XXA Unspecified injury of thorax, initial encounter: Secondary | ICD-10-CM

## 2019-03-27 ENCOUNTER — Other Ambulatory Visit: Payer: Self-pay | Admitting: Hematology

## 2019-03-27 DIAGNOSIS — Z9011 Acquired absence of right breast and nipple: Secondary | ICD-10-CM

## 2019-03-27 DIAGNOSIS — Z1231 Encounter for screening mammogram for malignant neoplasm of breast: Secondary | ICD-10-CM

## 2019-04-08 ENCOUNTER — Other Ambulatory Visit: Payer: Self-pay

## 2019-04-08 DIAGNOSIS — D500 Iron deficiency anemia secondary to blood loss (chronic): Secondary | ICD-10-CM

## 2019-04-09 ENCOUNTER — Encounter: Payer: Self-pay | Admitting: Hematology

## 2019-04-09 ENCOUNTER — Inpatient Hospital Stay: Payer: PPO | Attending: Hematology

## 2019-04-09 DIAGNOSIS — D500 Iron deficiency anemia secondary to blood loss (chronic): Secondary | ICD-10-CM | POA: Diagnosis not present

## 2019-04-09 LAB — OCCULT BLOOD X 1 CARD TO LAB, STOOL
Fecal Occult Bld: NEGATIVE
Fecal Occult Bld: NEGATIVE
Fecal Occult Bld: POSITIVE — AB

## 2019-04-11 ENCOUNTER — Encounter: Payer: Self-pay | Admitting: Family Medicine

## 2019-04-13 ENCOUNTER — Ambulatory Visit: Payer: PPO | Admitting: Internal Medicine

## 2019-04-13 ENCOUNTER — Encounter: Payer: Self-pay | Admitting: Hematology

## 2019-04-14 DIAGNOSIS — H43811 Vitreous degeneration, right eye: Secondary | ICD-10-CM | POA: Diagnosis not present

## 2019-04-14 DIAGNOSIS — H353212 Exudative age-related macular degeneration, right eye, with inactive choroidal neovascularization: Secondary | ICD-10-CM | POA: Diagnosis not present

## 2019-04-14 DIAGNOSIS — H43391 Other vitreous opacities, right eye: Secondary | ICD-10-CM | POA: Diagnosis not present

## 2019-04-14 DIAGNOSIS — H353131 Nonexudative age-related macular degeneration, bilateral, early dry stage: Secondary | ICD-10-CM | POA: Diagnosis not present

## 2019-04-15 ENCOUNTER — Encounter: Payer: Self-pay | Admitting: Family Medicine

## 2019-04-15 ENCOUNTER — Other Ambulatory Visit: Payer: Self-pay

## 2019-04-15 DIAGNOSIS — L309 Dermatitis, unspecified: Secondary | ICD-10-CM

## 2019-04-15 MED ORDER — TRIAMCINOLONE ACETONIDE 0.1 % EX CREA: TOPICAL_CREAM | Freq: Two times a day (BID) | CUTANEOUS | 1 refills | Status: DC

## 2019-04-16 ENCOUNTER — Telehealth: Payer: Self-pay | Admitting: Internal Medicine

## 2019-04-16 NOTE — Telephone Encounter (Signed)
F/U Message           Patient is returning someone's  Call, would like a call back.

## 2019-04-16 NOTE — Telephone Encounter (Signed)
Pt has very mild plaquing of the carotid arteries on USN OK to come off of aspiring and follow bowel movements

## 2019-04-16 NOTE — Telephone Encounter (Signed)
Was asked to call pt due to Triage being short.   Call placed to pt re: call from pt about coming off of Aspirin. Pt states that she has been having some blood in her stool recently.  Has reported and f/u with her Oncologist re: this problem. Dr. Burr Medico has asked her to reach out to Dr. Harrington Challenger and see if she was ok with the pt coming off the Aspirin for a little while, to see if this is the cause. Pt made aware that Dr. Harrington Challenger will be back in the office tomorrow, and that I would send her and her RN a message and someone would get back with her once Dr. Harrington Challenger has addressed it.  Pt was very pleased with this and will await the call back.

## 2019-04-16 NOTE — Telephone Encounter (Signed)
I spoke to the patient and shared Dr Harrington Challenger' recommendations.  She will stop Aspirin for now and let us know results of bowel movements.

## 2019-04-16 NOTE — Telephone Encounter (Signed)
Called pt back re: her question re: aspirin.  Left a message for her to call back.

## 2019-04-16 NOTE — Telephone Encounter (Signed)
Patient is stating she has been having blood in her stool lately, her oncologist recommended to her to stop taking the baby aspirin of a little while to see if that would make a difference.  Patient wants to make sure that is ok to do.

## 2019-04-17 ENCOUNTER — Encounter: Payer: Self-pay | Admitting: Hematology

## 2019-04-17 NOTE — Telephone Encounter (Signed)
Anne [name], I got your message, sounds good to me. Thanks   Truitt Merle  04/17/2019

## 2019-04-21 ENCOUNTER — Encounter: Payer: Self-pay | Admitting: Hematology

## 2019-04-23 ENCOUNTER — Telehealth: Payer: Self-pay | Admitting: *Deleted

## 2019-04-23 DIAGNOSIS — D500 Iron deficiency anemia secondary to blood loss (chronic): Secondary | ICD-10-CM

## 2019-04-23 NOTE — Telephone Encounter (Signed)
Patient returned call

## 2019-04-23 NOTE — Addendum Note (Signed)
Addended by: Gaetano Net on: 04/23/2019 12:48 PM   Modules accepted: Orders

## 2019-04-23 NOTE — Telephone Encounter (Signed)
Called pt & she has not received hemoccult cards.  Will mail cards to her.  Order placed.

## 2019-04-23 NOTE — Telephone Encounter (Signed)
Call placed to pt again re: her question about coming off Aspirin. Left a message for her to call back.

## 2019-04-27 ENCOUNTER — Other Ambulatory Visit: Payer: Self-pay | Admitting: Internal Medicine

## 2019-04-27 DIAGNOSIS — K921 Melena: Secondary | ICD-10-CM

## 2019-04-28 ENCOUNTER — Other Ambulatory Visit (INDEPENDENT_AMBULATORY_CARE_PROVIDER_SITE_OTHER): Payer: PPO

## 2019-04-28 DIAGNOSIS — K921 Melena: Secondary | ICD-10-CM | POA: Diagnosis not present

## 2019-04-28 LAB — CBC
HCT: 38.5 % (ref 36.0–46.0)
Hemoglobin: 12.7 g/dL (ref 12.0–15.0)
MCHC: 33 g/dL (ref 30.0–36.0)
MCV: 95.2 fl (ref 78.0–100.0)
Platelets: 267 10*3/uL (ref 150.0–400.0)
RBC: 4.04 Mil/uL (ref 3.87–5.11)
RDW: 14.2 % (ref 11.5–15.5)
WBC: 6.6 10*3/uL (ref 4.0–10.5)

## 2019-04-28 LAB — FERRITIN: Ferritin: 35.7 ng/mL (ref 10.0–291.0)

## 2019-04-28 NOTE — Progress Notes (Signed)
The blood counts and the iron levels are normal which is good news.  I hope your bowels are back to normal.  If you are still struggling with these then it would be best to schedule an office visit which could be done virtually i.e. with a video call or over the phone.  I do not think any other testing is necessary based upon these results.  Best regards,  Gatha Mayer, MD, [facility]

## 2019-04-29 NOTE — Telephone Encounter (Signed)
Attempted to call pt to get her scheduled for either a mychart video visit or a televisit with provider to for medication management but unable to reach.left message for pt to return call.

## 2019-04-30 ENCOUNTER — Encounter: Payer: Self-pay | Admitting: Family Medicine

## 2019-04-30 NOTE — Telephone Encounter (Signed)
LMTCB

## 2019-05-01 ENCOUNTER — Other Ambulatory Visit: Payer: Self-pay | Admitting: Family Medicine

## 2019-05-01 DIAGNOSIS — S299XXA Unspecified injury of thorax, initial encounter: Secondary | ICD-10-CM

## 2019-05-01 MED ORDER — LORAZEPAM 1 MG PO TABS: ORAL_TABLET | ORAL | 1 refills | Status: DC

## 2019-05-04 NOTE — Progress Notes (Deleted)
Virtual Visit via Video Note  I connected with patient on 05/05/19 at  3:00 PM EDT by audo enabled telemedicine application and verified that I am speaking with the correct person using two identifiers.   THIS ENCOUNTER IS A VIRTUAL VISIT DUE TO COVID-19 - PATIENT WAS NOT SEEN IN THE OFFICE. PATIENT HAS CONSENTED TO VIRTUAL VISIT / TELEMEDICINE VISIT   Location of patient: home  Location of provider: office  I discussed the limitations of evaluation and management by telemedicine and the availability of in person appointments. The patient expressed understanding and agreed to proceed.   Subjective:   KAHLIYA FRALEIGH is a 78 y.o. female who presents for Medicare Annual (Subsequent) preventive examination.  Review of Systems:No ROS.  Medicare Wellness Virtual Visit.  Visual/audio telehealth visit, UTA vital signs.   See social history for additional risk factors.   Sleep patterns:    Home Safety/Smoke Alarms: Feels safe in home. Smoke alarms in place.  Lives with husband in 2 story home.   Female:     Mammo- scheduled 05/19/19       Dexa scan-  06/23/18      CCS-10/17/18.      Objective:     Vitals: There were no vitals taken for this visit.  There is no height or weight on file to calculate BMI.  Advanced Directives 03/17/2018 11/21/2017 11/19/2017 09/11/2017 08/10/2017 05/30/2017 05/27/2017  Does Patient Have a Medical Advance Directive? No No No No No No No  Does patient want to make changes to medical advance directive? - - - - - - -  Would patient like information on creating a medical advance directive? Yes (MAU/Ambulatory/Procedural Areas - Information given) - Yes (MAU/Ambulatory/Procedural Areas - Information given) Yes (MAU/Ambulatory/Procedural Areas - Information given) No - Patient declined No - Patient declined -  Pre-existing out of facility DNR order (yellow form or pink MOST form) - - - - - - -    Tobacco Social History   Tobacco Use  Smoking Status Former Smoker   . Packs/day: 1.00  . Years: 50.00  . Pack years: 50.00  . Types: Cigarettes  . Quit date: 05/22/2007  . Years since quitting: 11.9  Smokeless Tobacco Never Used     Counseling given: Not Answered   Clinical Intake:                       Past Medical History:  Diagnosis Date  . Abdominal pain 08/11/2017  . ALKALINE PHOSPHATASE, ELEVATED 03/15/2009  . Allergic state 06/10/2012  . Anemia   . Anxiety   . Anxiety and depression 04/28/2011  . Arthritis   . Atypical chest pain 11/30/2011  . 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 Baker's cyst of knee 05/22/2011  . 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 Carotid artery disease ([place])    a. Carotid duplex 03/2014: stable 1-39% BICA, f/u due 03/2016.  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    [name] COPD (chronic obstructive pulmonary disease) ([place])   . Dermatitis 11/23/2012  . Dysphagia   . EE (eosinophilic esophagitis)   . Elevated sed rate 08/02/2013  . Esophageal ring   . ESOPHAGEAL STRICTURE 03/29/2009  . Fall 11/23/2012  . Family history of breast cancer   . Family history of colon cancer   . Family history of ovarian cancer   .  Family history of pancreatic cancer   . Folliculitis of nose 2/40/9735  . GERD (gastroesophageal reflux disease) 09/29/2009   improved s/p cholecystectomy and esophagus dilatation  . Hematuria   . History of chicken pox   . History of measles   . History of shingles    2 episodes  . Hx of echocardiogram    a. Echo 01/2013: mild LVH, EF 55-60%, normal wall motion, Gr 1 diast dysfn  . Hyperlipidemia   . Knee pain, bilateral 07/23/2011  . Medicare annual wellness visit, subsequent 06/19/2015  . Mixed hyperlipidemia 10/17/2010   Qualifier: Diagnosis of  By: Mack Guise    . Orthostasis   . Osteopenia 03/14/2011  . Personal history of chemotherapy 2001  . Personal history of  radiation therapy 2001   rt breast  . 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 Pneumonia 2-10   pleurisy  . PVC's (premature ventricular contractions)    a. Event monitor 01/2013: NSR, extensive PVCs.  . Radial neck fracture 10/2011   minimally displaced  . Sinusitis, acute 12/04/2015  . Trauma 10/18/2011  . URI (upper respiratory infection) 09/02/2012  . Urinary frequency 12/12/2014  . Urinary incontinence 03/19/2012  . Vaginitis 05/22/2011   Past Surgical History:  Procedure Laterality Date  . AUGMENTATION MAMMAPLASTY Right 05/27/2007  . BREAST BIOPSY Right 01/02/2007   wire loc  . BREAST BIOPSY  12/26/2006  . BREAST LUMPECTOMY Right 2001  . BREAST RECONSTRUCTION  2008, 2009, 2010  . BREAST REDUCTION WITH MASTOPEXY Left 05/30/2017   Procedure: LEFT BREAST REDUCTION FOR SYMTERY WITH MASTOPEXY;  Surgeon: Wallace Going, DO;  Location: [place] [facility];  Service: Plastics;  Laterality: Left;  . CHOLECYSTECTOMY  2010  . COLONOSCOPY  09/05/10   cecal avm's  . DENTAL SURGERY  05/2016   4 dental implants by Dr. Loyal Gambler.  [name] ERCP  2010    CBD stone extraction   . ESOPHAGOGASTRODUODENOSCOPY  01/08/2012   Procedure: ESOPHAGOGASTRODUODENOSCOPY (EGD);  Surgeon: Gatha Mayer, MD;  Location: [facility];  Service: Endoscopy;  Laterality: N/A;  . ESOPHAGOGASTRODUODENOSCOPY (EGD) WITH ESOPHAGEAL DILATION  2010, 2012  . LAPAROSCOPIC APPENDECTOMY N/A 08/04/2016   Procedure: APPENDECTOMY LAPAROSCOPIC;  Surgeon: Michael Boston, MD;  Location: [facility];  Service: General;  Laterality: N/A;  . LIPOSUCTION WITH LIPOFILLING Left 11/21/2017   Procedure: LIPOSUCTION FROM ABDOMEN WITH LIPOFILLING TO LEFT BREAST;  Surgeon: Wallace Going, DO;  Location: [place];  Service: Plastics;  Laterality: Left;  [name] MASTECTOMY MODIFIED RADICAL Right 05/27/2007   , Mastectomy modified radical (08), breast reconstruction, CA lesions excised lateral abd wall 2010  . MASTOPEXY Left 11/21/2017    Procedure: LEFT BREAST REVISION MASTOPEXY FOR SYMMETRY;  Surgeon: Wallace Going, DO;  Location: [place];  Service: Plastics;  Laterality: Left;  . REDUCTION MAMMAPLASTY Left   . SAVORY DILATION  01/08/2012   Procedure: SAVORY DILATION;  Surgeon: Gatha Mayer, MD;  Location: [facility];  Service: Endoscopy;  Laterality: N/A;  need xray   Family History  Problem Relation Age of Onset  . Heart disease Father   . Lung cancer Father        smoker  . Cirrhosis Sister        Primary Biliary  . Stroke Maternal Grandmother   . Alcohol abuse Maternal Grandfather   . Heart disease Paternal Grandfather   . Anxiety disorder Sister   . Osteoporosis Sister   . Arthritis Sister  Rheumatoid  . Osteoporosis Sister   . Skin cancer Sister        multiple skin cancers, over 34 excisions.  . Other Mother        tic [place]  . Breast cancer Maternal Aunt        dx in her 30s  . Breast cancer Paternal Aunt        dx in her 8s  . Pancreatic cancer Maternal Uncle        dx in his 55s; smoker  . Breast cancer Maternal Aunt        dx in her 64s  . Breast cancer Maternal Aunt        possible breast cancer dx and died in her 48s  . Ovarian cancer Maternal Aunt 29  . Pancreatic cancer Maternal Aunt        dx in her 53s  . Stomach cancer Maternal Uncle   . Colon cancer Maternal Uncle   . Lung cancer Paternal Aunt   . Breast cancer Cousin        paternal first cousin  . Breast cancer Cousin        maternal first cousin  . Anesthesia problems Neg Hx   . Hypotension Neg Hx   . Malignant hyperthermia Neg Hx   . Pseudochol deficiency Neg Hx    Social History   Socioeconomic History  . Marital status: Married    Spouse name: Sam  . Number of children: 3  . Years of education: Not on file  . Highest education level: Not on file  Occupational History  . Occupation: Social worker  Social Needs  . Financial resource strain: Not on file  . Food insecurity     Worry: Not on file    Inability: Not on file  . Transportation needs    Medical: Not on file    Non-medical: Not on file  Tobacco Use  . Smoking status: Former Smoker    Packs/day: 1.00    Years: 50.00    Pack years: 50.00    Types: Cigarettes    Quit date: 05/22/2007    Years since quitting: 11.9  . Smokeless tobacco: Never Used  Substance and Sexual Activity  . Alcohol use: Not Currently  . Drug use: No  . Sexual activity: Yes    Partners: Male    Comment: lives with husband,   Lifestyle  . Physical activity    Days per week: Not on file    Minutes per session: Not on file  . Stress: Not on file  Relationships  . Social Herbalist on phone: Not on file    Gets together: Not on file    Attends religious service: Not on file    Active member of club or organization: Not on file    Attends meetings of clubs or organizations: Not on file    Relationship status: Not on file  Other Topics Concern  . Not on file  Social History Narrative  . Not on file    Outpatient Encounter Medications as of 05/05/2019  Medication Sig  . acetaminophen (TYLENOL) 500 MG tablet Take 500-1,000 mg by mouth every 6 (six) hours as needed (for pain.).  [name] amitriptyline (ELAVIL) 25 MG tablet TAKE 6 TABLETS(150 MG) BY MOUTH AT BEDTIME  . atorvastatin (LIPITOR) 20 MG tablet TAKE 1 TABLET(20 MG) BY MOUTH TWICE DAILY  . Biotin (BIOTIN 5000) 5 MG CAPS Take by mouth.  . Cholecalciferol (VITAMIN D3) 1000  UNITS CAPS Take 2,000 Units by mouth daily.   . Cyanocobalamin (VITAMIN B 12 PO) Take 1 tablet by mouth daily.  . cyclobenzaprine (FLEXERIL) 10 MG tablet Take 1 tablet (10 mg total) by mouth 2 (two) times daily as needed for muscle spasms.  [name] diltiazem (CARDIZEM) 30 MG tablet TAKE 1 TABLET BY MOUTH ONCE DAILY FOR PALPITATIONS  . Ferrous Fumarate-Folic Acid 315-4 MG TABS Take 1 tablet by mouth daily.  [name] loratadine (CLARITIN) 10 MG tablet Take 1 tablet (10 mg total) by mouth daily.  [name] LORazepam  (ATIVAN) 1 MG tablet TAKE 1 TABLET(1 MG) BY MOUTH EVERY 8 HOURS AS NEEDED FOR ANXIETY  . pantoprazole (PROTONIX) 40 MG tablet Take 1 tablet (40 mg total) by mouth daily.  [name] triamcinolone cream (KENALOG) 0.1 % Apply topically 2 (two) times daily. Apply to affected area   No facility-administered encounter medications on file as of 05/05/2019.     Activities of Daily Living No flowsheet data found.  Patient Care Team: [name], MD as PCP - General (Family Medicine) Paralee Cancel, MD as Consulting Physician (Orthopedic Surgery) Fay Records, MD as Consulting Physician (Cardiology) Gatha Mayer, MD as Consulting Physician (Gastroenterology) Clent Jacks, MD as Consulting Physician (Ophthalmology) Roney Jaffe, DDS (Oral Surgery) Everlene Other (Dentistry) [place], Jennefer Bravo, MD as Referring Physician (Dermatology) Dillingham, Loel Lofty, DO as Attending Physician (Plastic Surgery) Truitt Merle, MD as Consulting Physician (Hematology)    Assessment:   This is a routine wellness examination for [place]. Physical assessment deferred to PCP.  Exercise Activities and Dietary recommendations   Diet (meal preparation, eat out, water intake, caffeinated beverages, dairy products, fruits and vegetables): {Desc; diets:16563} Breakfast: Lunch:  Dinner:      Goals    . DIET - INCREASE WATER INTAKE    . Increase physical activity    . Increase water intake     Decrease soda intake by 1/2 of what you are currently drinking. Decrease to 2 lattes daily and then continue to cut back as you are able.       Fall Risk Fall Risk  03/17/2018 03/11/2017 05/10/2016 12/07/2014 10/18/2014  Falls in the past year? No No Yes Yes Yes  Number falls in past yr: - - 1 2 or more 1  Comment - - - - -  Injury with Fall? - - Yes - No  Risk Factor Category  - - - - -  Risk for fall due to : - - - - -  Risk for fall due to: Comment - - - - -   Depression Screen PHQ 2/9 Scores 03/17/2018 03/11/2017  05/10/2016 12/07/2014  PHQ - 2 Score 0 2 0 3  PHQ- 9 Score - 4 - 7     Cognitive Function Ad8 score reviewed for issues:  Issues making decisions:  Less interest in hobbies / activities:  Repeats questions, stories (family complaining):  Trouble using ordinary gadgets (microwave, computer, phone):  Forgets the month or year:   Mismanaging finances:   Remembering appts:  Daily problems with thinking and/or memory: Ad8 score is=     MMSE - Mini Mental State Exam 03/11/2017  Orientation to time 5  Orientation to Place 5  Registration 3  Attention/ Calculation 5  Recall 3  Language- name 2 objects 2  Language- repeat 1  Language- follow 3 step command 3  Language- read & follow direction 1  Write a sentence 1  Copy design 1  Total score 30  Immunization History  Administered Date(s) Administered  . Influenza Split 08/27/2011, 10/21/2012  . Influenza, High Dose Seasonal PF 08/01/2016, 09/19/2017  . Influenza, Quadrivalent, Recombinant, Inj, Pf 08/19/2018  . Influenza,inj,Quad PF,6+ Mos 07/31/2013, 12/07/2014, 11/01/2015  . Tdap 04/08/2013    Screening Tests Health Maintenance  Topic Date Due  . PNA vac Low Risk Adult (1 of 2 - PCV13) 09/09/2007  . INFLUENZA VACCINE  06/13/2019  . TETANUS/TDAP  04/09/2023  . COLONOSCOPY  10/18/2023  . DEXA SCAN  Completed      Plan:   ***   I have personally reviewed and noted the following in the patient's chart:   . Medical and social history . Use of alcohol, tobacco or illicit drugs  . Current medications and supplements . Functional ability and status . Nutritional status . Physical activity . Advanced directives . List of other physicians . Hospitalizations, surgeries, and ER visits in previous 12 months . Vitals . Screenings to include cognitive, depression, and falls . Referrals and appointments  In addition, I have reviewed and discussed with patient certain preventive protocols, quality metrics,  and best practice recommendations. A written personalized care plan for preventive services as well as general preventive health recommendations were provided to patient.     Claira, Jeter [place], [place]  05/04/2019

## 2019-05-05 ENCOUNTER — Ambulatory Visit: Payer: PPO | Admitting: *Deleted

## 2019-05-05 ENCOUNTER — Other Ambulatory Visit: Payer: Self-pay

## 2019-05-05 ENCOUNTER — Encounter: Payer: Self-pay | Admitting: Family Medicine

## 2019-05-05 ENCOUNTER — Ambulatory Visit: Payer: Self-pay | Admitting: *Deleted

## 2019-05-05 NOTE — Telephone Encounter (Signed)
Pt called in saying she had a 3:00 appt today with Dr. Charlett Blake.   She didn't know if it was over the phone or was she supposed to come in?   I checked but she did not have an appt with Dr. Charlett Blake today.   It was with Naaman Plummer.   I called the office and checked for her. Anne [name] is going to call the pt for her 3:00 appt.   I verified Anne [name]'s phone number. I let pt know Quentin Strebel would be calling her shortly for her 3:00 appt via phone. Pt acknowledged this and will wait for Shakala's call.

## 2019-05-08 ENCOUNTER — Inpatient Hospital Stay: Payer: PPO | Attending: Hematology

## 2019-05-08 DIAGNOSIS — D500 Iron deficiency anemia secondary to blood loss (chronic): Secondary | ICD-10-CM

## 2019-05-11 LAB — OCCULT BLOOD X 1 CARD TO LAB, STOOL
Fecal Occult Bld: NEGATIVE
Fecal Occult Bld: NEGATIVE
Fecal Occult Bld: NEGATIVE

## 2019-05-14 ENCOUNTER — Encounter: Payer: Self-pay | Admitting: Hematology

## 2019-05-18 ENCOUNTER — Other Ambulatory Visit: Payer: Self-pay | Admitting: Family Medicine

## 2019-05-19 ENCOUNTER — Ambulatory Visit
Admission: RE | Admit: 2019-05-19 | Discharge: 2019-05-19 | Disposition: A | Discharge status: Home / Self Care | Payer: PPO | Source: Ambulatory Visit | Attending: Hematology | Admitting: Hematology

## 2019-05-19 DIAGNOSIS — Z1231 Encounter for screening mammogram for malignant neoplasm of breast: Secondary | ICD-10-CM | POA: Diagnosis not present

## 2019-05-19 DIAGNOSIS — Z9011 Acquired absence of right breast and nipple: Secondary | ICD-10-CM

## 2019-05-29 ENCOUNTER — Emergency Department (HOSPITAL_BASED_OUTPATIENT_CLINIC_OR_DEPARTMENT_OTHER)
Admission: EM | Admit: 2019-05-29 | Discharge: 2019-05-29 | Disposition: A | Discharge status: Home / Self Care | Payer: PPO | Attending: Emergency Medicine | Admitting: Emergency Medicine

## 2019-05-29 ENCOUNTER — Ambulatory Visit (INDEPENDENT_AMBULATORY_CARE_PROVIDER_SITE_OTHER): Payer: PPO | Admitting: Family Medicine

## 2019-05-29 ENCOUNTER — Other Ambulatory Visit: Payer: Self-pay

## 2019-05-29 ENCOUNTER — Encounter: Payer: Self-pay | Admitting: Family Medicine

## 2019-05-29 ENCOUNTER — Encounter (HOSPITAL_BASED_OUTPATIENT_CLINIC_OR_DEPARTMENT_OTHER): Payer: Self-pay

## 2019-05-29 VITALS — BP 120/80 | HR 103 | Temp 98.0°F | Ht 65.5 in | Wt 135.0 lb

## 2019-05-29 DIAGNOSIS — J45909 Unspecified asthma, uncomplicated: Secondary | ICD-10-CM | POA: Diagnosis not present

## 2019-05-29 DIAGNOSIS — Z91010 Allergy to peanuts: Secondary | ICD-10-CM | POA: Diagnosis not present

## 2019-05-29 DIAGNOSIS — R040 Epistaxis: Secondary | ICD-10-CM | POA: Diagnosis not present

## 2019-05-29 DIAGNOSIS — J449 Chronic obstructive pulmonary disease, unspecified: Secondary | ICD-10-CM | POA: Diagnosis not present

## 2019-05-29 DIAGNOSIS — I2510 Atherosclerotic heart disease of native coronary artery without angina pectoris: Secondary | ICD-10-CM | POA: Insufficient documentation

## 2019-05-29 DIAGNOSIS — Z79899 Other long term (current) drug therapy: Secondary | ICD-10-CM | POA: Diagnosis not present

## 2019-05-29 DIAGNOSIS — Z87891 Personal history of nicotine dependence: Secondary | ICD-10-CM | POA: Insufficient documentation

## 2019-05-29 MED ORDER — OXYMETAZOLINE HCL 0.05 % NA SOLN
NASAL | Status: AC
  Administered 2019-05-29: 17:00:00
  Filled 2019-05-29: qty 30

## 2019-05-29 NOTE — Patient Instructions (Signed)
Go to ER

## 2019-05-29 NOTE — Progress Notes (Signed)
Chief Complaint  Patient presents with  . nose bleeds    Subjective: Patient is a 78 y.o. female here for nose bleeds.  Has been coming from R side over past 3 weeks. Started on L side yesterday. No injury. Had been blowing nose freq on R side before. Usually lasts several minutes. Is on baby aspirin daily. Has used allergy medicine so she doesn't have to blow her nose. Has used Vaseline at home and air humidifier.   ROS: HEENT: +nosebleed  Past Medical History:  Diagnosis Date  . ALKALINE PHOSPHATASE, ELEVATED 03/15/2009  . Allergic state 06/10/2012  . Anemia   . Anxiety and depression 04/28/2011  . Arthritis   . Atypical chest pain 11/30/2011  . 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 Baker's cyst of knee 05/22/2011  . 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 Carotid artery disease ([place])    a. Carotid duplex 03/2014: stable 1-39% BICA, f/u due 03/2016.  [name] Chronic alcoholism in remission ([place]) 03/29/2011   Did not tolerate Klonopin, caused some confusion and bad dreams.    [name] COPD (chronic obstructive pulmonary disease) ([place])   . Dermatitis 11/23/2012  . EE (eosinophilic esophagitis)   . Esophageal ring   . ESOPHAGEAL STRICTURE 03/29/2009  . Fall 11/23/2012  . Family history of breast cancer   . Family history of colon cancer   . Family history of ovarian cancer   . Family history of pancreatic cancer   . Folliculitis of nose 0/86/5784  . GERD (gastroesophageal reflux disease) 09/29/2009   improved s/p cholecystectomy and esophagus dilatation  . History of chicken pox   . History of measles   . History of shingles    2 episodes  . Hx of echocardiogram    a. Echo 01/2013: mild LVH, EF 55-60%, normal wall motion, Gr 1 diast dysfn  . Hyperlipidemia   . Knee pain, bilateral 07/23/2011  . Medicare annual wellness visit, subsequent 06/19/2015  . Mixed hyperlipidemia 10/17/2010   Qualifier: Diagnosis of  By: Mack Guise    . Orthostasis   .  Osteopenia 03/14/2011  . Personal history of chemotherapy 2001  . Personal history of radiation therapy 2001   rt breast  . 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 PVC's (premature ventricular contractions)    a. Event monitor 01/2013: NSR, extensive PVCs.  . Radial neck fracture 10/2011   minimally displaced  . Urinary incontinence 03/19/2012  . Vaginitis 05/22/2011    Objective: BP 120/80 (BP Location: Left Arm, Patient Position: Sitting, Cuff Size: Normal)   Pulse (!) 103   Temp 98 F (36.7 C) (Oral)   Ht 5' 5.5" (1.664 m)   Wt 135 lb (61.2 kg)   SpO2 96%   BMI 22.12 kg/m  General: Awake, appears stated age HEENT: Small varicosity along R septum anteriorly, no active bleeding during my exam; during the L side, there was immediate discomfort from the pooling of blood, I was unable to appreciate any significant findings during the brief visualization. Lungs: No accessory muscle use Psych: Age appropriate judgment and insight, normal affect and mood  Assessment and Plan: Epistaxis - Plan: Ambulatory referral to ENT; stat referral had failed after speaking with Dr. Constance Holster. Rec'd sending to ER, [facility] with [facility] who is comfortable managing her care moving forward.   Greater than 25 minutes were spent face to face with the patient with greater than 50% of this time  spent counseling on nose bleeds and coordinating care with ENT, ER.   The patient and her husband voiced understanding and agreement to the plan.  [place], DO 05/29/19  3:28 PM

## 2019-05-29 NOTE — ED Triage Notes (Addendum)
Pt c/o intermittent nose bleed x 3 weeks-worse just PTA-sent from PCP office in the building-pt states no tx in PCP office-pt holding nose with tissue-blood noted-no bleed over and pt not spitting blood-to triage in w/c

## 2019-05-29 NOTE — ED Notes (Signed)
ENT cart at bedside

## 2019-05-29 NOTE — ED Provider Notes (Signed)
[facility] EMERGENCY DEPARTMENT Provider Note   CSN: [id number] Arrival date & time: 05/29/19  1538     History   Chief Complaint Chief Complaint  Patient presents with  . Epistaxis    HPI Anne [name] is a 78 y.o. female.     HPI 78 year old female on 81 mg aspirin presents the emergency department with left-sided epistaxis intermittent over the past several weeks.  She states it had not bled for several days and began bleeding significantly this afternoon.  She was seen by her primary care team and it was unable to be controlled in the office and thus she was sent to the ER for further evaluation.  She is on no other anticoagulants.  She denies injury or trauma.  There is no significant bleeding at this time.  No posterior pharyngeal bleeding.  No easy bruising.  Symptoms are mild to moderate in severity Past Medical History:  Diagnosis Date  . ALKALINE PHOSPHATASE, ELEVATED 03/15/2009  . Allergic state 06/10/2012  . Anemia   . Anxiety and depression 04/28/2011  . Arthritis   . Atypical chest pain 11/30/2011  . 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 Baker's cyst of knee 05/22/2011  . Cancer ([place]) 01,  08   XRT/chemo 01-02/ lobular invasive ca  . Carotid artery disease ([place])    a. Carotid duplex 03/2014: stable 1-39% BICA, f/u due 03/2016.  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    [name] COPD (chronic obstructive pulmonary disease) ([place])   . Dermatitis 11/23/2012  . EE (eosinophilic esophagitis)   . Esophageal ring   . ESOPHAGEAL STRICTURE 03/29/2009  . Fall 11/23/2012  . Family history of breast cancer   . Family history of colon cancer   . Family history of ovarian cancer   . Family history of pancreatic cancer   . Folliculitis of nose 02/18/8118  . GERD (gastroesophageal reflux disease) 09/29/2009   improved s/p cholecystectomy and esophagus dilatation  . History of  chicken pox   . History of measles   . History of shingles    2 episodes  . Hx of echocardiogram    a. Echo 01/2013: mild LVH, EF 55-60%, normal wall motion, Gr 1 diast dysfn  . Hyperlipidemia   . Knee pain, bilateral 07/23/2011  . Medicare annual wellness visit, subsequent 06/19/2015  . Mixed hyperlipidemia 10/17/2010   Qualifier: Diagnosis of  By: Mack Guise    . Orthostasis   . Osteopenia 03/14/2011  . Personal history of chemotherapy 2001  . Personal history of radiation therapy 2001   rt breast  . 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 PVC's (premature ventricular contractions)    a. Event monitor 01/2013: NSR, extensive PVCs.  . Radial neck fracture 10/2011   minimally displaced  . Urinary incontinence 03/19/2012  . Vaginitis 05/22/2011    Patient Active Problem List   Diagnosis Date Noted  . Hyperglycemia 08/19/2018  . Mild intermittent asthma without complication 14/78/2956  . Oral candidiasis 05/27/2018  . Sore throat 05/27/2018  . Asthma 04/30/2018  . 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 Palpitations 04/30/2018  . Skin lesion of face 02/07/2018  . Low back pain 09/23/2017  . Hypokalemia 08/11/2017  . Abdominal pain 08/11/2017  . SBO (small bowel obstruction) ([place]) 08/10/2017  . History of right breast cancer 03/21/2017  . Genetic testing 09/13/2016  . Family history of breast cancer   .  Family history of pancreatic cancer   . Family history of colon cancer   . Pain in the chest 05/16/2016  . Status post right breast reconstruction 05/11/2016  . Breast cancer 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 ([place]) 05/11/2016  . History of breast cancer in female 05/11/2016  . Osteopenia determined by x-ray 05/11/2016  . IBS (irritable bowel syndrome) 03/09/2016  . Dyspnea 06/19/2015  . Medicare annual wellness visit, subsequent 06/19/2015  . Urinary frequency 12/12/2014  . Breast cancer, right breast ([place]) 10/18/2014  . Superficial thrombophlebitis 03/16/2014  . Plant dermatitis  03/16/2014  . Chest wall pain 02/07/2014  . Primary localized osteoarthrosis, lower leg 01/21/2014  . Elevated sed rate 08/02/2013  . Dermatitis 11/23/2012  . Preventative health care 10/21/2012  . Allergy 06/10/2012  . Urinary incontinence 03/19/2012  . Anemia 12/21/2011  . Knee pain, bilateral 07/23/2011  . Baker's cyst of knee 05/22/2011  . Eosinophilic esophagitis 09/60/4540  . Anxiety and depression 04/28/2011  . 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 Constipation, chronic 03/15/2011  . Mixed hyperlipidemia 10/17/2010  . CAROTID ARTERY STENOSIS 01/13/2010  . GERD 09/29/2009  . PERSONAL HX BREAST CANCER 09/29/2009    Past Surgical History:  Procedure Laterality Date  . AUGMENTATION MAMMAPLASTY Right 05/27/2007  . BREAST BIOPSY Right 01/02/2007   wire loc  . BREAST BIOPSY  12/26/2006  . BREAST LUMPECTOMY Right 2001  . BREAST RECONSTRUCTION  2008, 2009, 2010  . BREAST REDUCTION WITH MASTOPEXY Left 05/30/2017   Procedure: LEFT BREAST REDUCTION FOR SYMTERY WITH MASTOPEXY;  Surgeon: Wallace Going, DO;  Location: [facility];  Service: Plastics;  Laterality: Left;  . CHOLECYSTECTOMY  2010  . COLONOSCOPY  09/05/10   cecal avm's  . DENTAL SURGERY  05/2016   4 dental implants by Dr. Loyal Gambler.  [name] ERCP  2010    CBD stone extraction   . ESOPHAGOGASTRODUODENOSCOPY  01/08/2012   Procedure: ESOPHAGOGASTRODUODENOSCOPY (EGD);  Surgeon: Gatha Mayer, MD;  Location: [facility];  Service: Endoscopy;  Laterality: N/A;  . ESOPHAGOGASTRODUODENOSCOPY (EGD) WITH ESOPHAGEAL DILATION  2010, 2012  . LAPAROSCOPIC APPENDECTOMY N/A 08/04/2016   Procedure: APPENDECTOMY LAPAROSCOPIC;  Surgeon: Michael Boston, MD;  Location: [facility];  Service: General;  Laterality: N/A;  . LIPOSUCTION WITH LIPOFILLING Left 11/21/2017   Procedure: LIPOSUCTION FROM ABDOMEN WITH LIPOFILLING TO LEFT BREAST;  Surgeon: Wallace Going, DO;  Location: [facility];  Service:  Plastics;  Laterality: Left;  [name] MASTECTOMY MODIFIED RADICAL Right 05/27/2007   , Mastectomy modified radical (08), breast reconstruction, CA lesions excised lateral abd wall 2010  . MASTOPEXY Left 11/21/2017   Procedure: LEFT BREAST REVISION MASTOPEXY FOR SYMMETRY;  Surgeon: Wallace Going, DO;  Location: [facility];  Service: Plastics;  Laterality: Left;  . REDUCTION MAMMAPLASTY Left   . SAVORY DILATION  01/08/2012   Procedure: SAVORY DILATION;  Surgeon: Gatha Mayer, MD;  Location: [facility];  Service: Endoscopy;  Laterality: N/A;  need xray     OB History   No obstetric history on file.      Home Medications    Prior to Admission medications   Medication Sig Start Date End Date Taking? Authorizing Provider  acetaminophen (TYLENOL) 500 MG tablet Take 500-1,000 mg by mouth every 6 (six) hours as needed (for pain.).    [provider]  amitriptyline (ELAVIL) 25 MG tablet TAKE 6 TABLETS(150 MG) BY MOUTH AT BEDTIME 05/18/19   [name], MD  atorvastatin (LIPITOR) 20 MG  tablet TAKE 1 TABLET(20 MG) BY MOUTH TWICE DAILY 02/23/19   [name], MD  Biotin (BIOTIN 5000) 5 MG CAPS Take by mouth.    [provider]  Cholecalciferol (VITAMIN D3) 1000 UNITS CAPS Take 2,000 Units by mouth daily.     [provider]  Cyanocobalamin (VITAMIN B 12 PO) Take 1 tablet by mouth daily.    [provider]  diltiazem (CARDIZEM) 30 MG tablet TAKE 1 TABLET BY MOUTH ONCE DAILY FOR PALPITATIONS 05/05/18   Fay Records, MD  Ferrous Fumarate-Folic Acid 008-6 MG TABS Take 1 tablet by mouth daily. 09/03/18   [name], MD  loratadine (CLARITIN) 10 MG tablet Take 1 tablet (10 mg total) by mouth daily. 04/29/18   [name], MD  LORazepam (ATIVAN) 1 MG tablet TAKE 1 TABLET(1 MG) BY MOUTH EVERY 8 HOURS AS NEEDED FOR ANXIETY 05/01/19   [name], MD  pantoprazole (PROTONIX) 40 MG tablet Take 1 tablet (40 mg total) by mouth daily. 01/14/19    Colon Branch, MD  triamcinolone cream (KENALOG) 0.1 % Apply topically 2 (two) times daily. Apply to affected area 04/15/19   [name], MD    Family History Family History  Problem Relation Age of Onset  . Heart disease Father   . Lung cancer Father        smoker  . Cirrhosis Sister        Primary Biliary  . Stroke Maternal Grandmother   . Alcohol abuse Maternal Grandfather   . Heart disease Paternal Grandfather   . Anxiety disorder Sister   . Osteoporosis Sister   . Arthritis Sister        Rheumatoid  . Osteoporosis Sister   . Skin cancer Sister        multiple skin cancers, over 23 excisions.  . Other Mother        tic [place]  . Breast cancer Maternal Aunt        dx in her 93s  . Breast cancer Paternal Aunt        dx in her 75s  . Pancreatic cancer Maternal Uncle        dx in his 43s; smoker  . Breast cancer Maternal Aunt        dx in her 48s  . Breast cancer Maternal Aunt        possible breast cancer dx and died in her 74s  . Ovarian cancer Maternal Aunt 29  . Pancreatic cancer Maternal Aunt        dx in her 6s  . Stomach cancer Maternal Uncle   . Colon cancer Maternal Uncle   . Lung cancer Paternal Aunt   . Breast cancer Cousin        paternal first cousin  . Breast cancer Cousin        maternal first cousin  . Anesthesia problems Neg Hx   . Hypotension Neg Hx   . Malignant hyperthermia Neg Hx   . Pseudochol deficiency Neg Hx     Social History Social History   Tobacco Use  . Smoking status: Former Smoker    Packs/day: 1.00    Years: 50.00    Pack years: 50.00    Types: Cigarettes    Quit date: 05/22/2007    Years since quitting: 12.0  . Smokeless tobacco: Never Used  Substance Use Topics  . Alcohol use: Not Currently  . Drug use: No     Allergies  Codeine, Morphine, Peanut-containing drug products, Sorbitol, Tomato, Zofran, Advil [ibuprofen], Diphenhydramine hcl, and Oxycodone   Review of Systems Review of Systems  All other  systems reviewed and are negative.    Physical Exam Updated Vital Signs BP 137/85 (BP Location: Left Arm)   Pulse 95   Resp 20   SpO2 100%   Physical Exam Vitals signs and nursing note reviewed.  Constitutional:      Appearance: She is well-developed.  HENT:     Head: Normocephalic.     Comments: Stigmata of recent left-sided epistaxis with concomitant right-sided flow.  No active bleeding at this time.  No posterior pharyngeal bleeding Neck:     Musculoskeletal: Normal range of motion.  Pulmonary:     Effort: Pulmonary effort is normal.  Abdominal:     General: There is no distension.  Musculoskeletal: Normal range of motion.  Neurological:     Mental Status: She is alert and oriented to person, place, and time.      ED Treatments / Results  Labs (all labs ordered are listed, but only abnormal results are displayed) Labs Reviewed - No data to display  EKG None  Radiology No results found.  Procedures .Epistaxis Management Performed by: Jola Schmidt, MD Authorized by: Jola Schmidt, MD   Consent:    Consent obtained:  Verbal   Consent given by:  Patient   Risks discussed:  Bleeding Procedure details:    Treatment site:  L anterior   Repair method: Afrin and direct pressure.   Treatment complexity:  Limited Post-procedure details:    Assessment:  Bleeding stopped   Patient tolerance of procedure:  Tolerated well, no immediate complications   (including critical care time)  Medications Ordered in ED Medications  oxymetazoline (AFRIN) 0.05 % nasal spray (has no administration in time range)     Initial Impression / Assessment and Plan / ED Course  I have reviewed the triage vital signs and the nursing notes.  Pertinent labs & imaging results that were available during my care of the patient were reviewed by me and considered in my medical decision making (see chart for details).        Controlled with Afrin and direct pressure here in the  emergency department.  No significant bleeding encountered.  Recommended ice.  We had a long discussion about how to best control bleeding.  She has been packing her nose and I recommended not to do that.  She also has been touching her face and nose vigorously and sniffing and to see if the bleeding is stopped.  I suspect that she continues to disrupt the clot.  Outpatient ENT follow-up.  She is encouraged to return to the ER as needed for new or worsening symptoms.  Recommended icing her left side of her face throughout the evening  Final Clinical Impressions(s) / ED Diagnoses   Final diagnoses:  Left-sided epistaxis    ED Discharge Orders    None       Jola Schmidt, MD 05/29/19 1649

## 2019-05-29 NOTE — Discharge Instructions (Addendum)
Apply ice  Do not touch or pick your nose  Do not blow your nose or sniff in

## 2019-06-01 ENCOUNTER — Encounter: Payer: Self-pay | Admitting: Family Medicine

## 2019-06-02 NOTE — Telephone Encounter (Signed)
Called patient several times unable to leave voicemail until the last call, left voicemail for patient to call us back in the event she wants to get tested.

## 2019-06-03 ENCOUNTER — Encounter: Payer: Self-pay | Admitting: Hematology

## 2019-06-05 DIAGNOSIS — R040 Epistaxis: Secondary | ICD-10-CM | POA: Diagnosis not present

## 2019-06-08 NOTE — Progress Notes (Signed)
[place]   [phone] [phone]   Clinic Follow up Note   Patient Care Team: [name], MD as PCP - General (Family Medicine) Paralee Cancel, MD as Consulting Physician (Orthopedic Surgery) Fay Records, MD as Consulting Physician (Cardiology) Gatha Mayer, MD as Consulting Physician (Gastroenterology) Clent Jacks, MD as Consulting Physician (Ophthalmology) Roney Jaffe, DDS (Oral Surgery) Everlene Other (Dentistry) Haverstock, Jennefer Bravo, MD as Referring Physician (Dermatology) Dillingham, Loel Lofty, DO as Attending Physician (Plastic Surgery) Truitt Merle, MD as Consulting Physician (Hematology)  Date of Service:  06/10/2019  CHIEF COMPLAINT:  F/u of recurrent right breast cancer  SUMMARY OF ONCOLOGIC HISTORY: Oncology History  Breast cancer, right breast ([place])  01/2000 Initial Diagnosis   Breast cancer, right breast ([place])  History of primary lobular right breast carcinoma. 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 I believe that she had local radiation, tho that information is not available in this EMR.   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 2008 (tho patient did not agree to surgery until July 2008), invasive lobular carcinoma ER 81%, PR 96%, Her 2 1+ ([id number]) , 1.5 cm invasive lobular with 3 axillary nodes negative, LVSI and perineural invasion present ([id number]). She subsequently declined adjuvant systemic treatment . She had a complicated course following right mastectomy, with difficult healing after right breast reconstruction.     07/2009 Relapse/Recurrence   She had skin recurrence lateral right chest wall excised in Sept 2010 then treated with xeloda from Oct 2010 thru Feb 2011 and began on Arimidex July 2011; she may have stopped Arimidex after bone density scan 02-2014, or possibly the year prior (?).    2015 Imaging   Bone density scan [facility] 02-15-14 still osteopenic range, slightly lower in LS compared with 2013 and stable in hips.      CURRENT THERAPY:  Surveillance  INTERVAL HISTORY:  Anne [name] is here for a follow up recurrent right breast cancer. She was last seen by me 6 months ago. She presents to the clinic alone. She notes she saw her ENT last week and did not indicate any abnormalities the need for treatment. She has had no more epistaxis. She continues to use saline in her nose. She also notes this morning she was able to blow out of her right nostril. In her mucus it was a little bit of fresh blood. She has not tried anything since. She is no longer on baby aspirin due to concerns of bleeding.  She notes having back pain especially getting up or the process of sitting down daily. She notes her DEXA in 06/2018 with osteopenia. She has been taking 2 tylenol and Aspercreme to control her pain. Overall her chronic back pain has gotten worse in the last few months     REVIEW OF SYSTEMS:   Constitutional: Denies fevers, chills or abnormal weight loss Eyes: Denies blurriness of vision Ears, nose, mouth, throat, and face: Denies mucositis or sore throat Respiratory: Denies cough, dyspnea or wheezes Cardiovascular: Denies palpitation, chest discomfort or lower extremity swelling Gastrointestinal:  Denies nausea, heartburn or change in bowel habits Skin: Denies  abnormal skin rashes MSK: (+) Chronic back pain has gotten worse Lymphatics: Denies new lymphadenopathy or easy bruising Neurological:Denies numbness, tingling or new weaknesses Behavioral/Psych: Mood is stable, no new changes  All other systems  were reviewed with the patient and are negative.  MEDICAL HISTORY:  Past Medical History:  Diagnosis Date  . ALKALINE PHOSPHATASE, ELEVATED 03/15/2009  . Allergic state 06/10/2012  . Anemia   . Anxiety and depression 04/28/2011  . Arthritis   . Atypical chest pain 11/30/2011  . 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 Baker's cyst of knee 05/22/2011  . Cancer ([place]) 01,  08   XRT/chemo 01-02/ lobular invasive ca  . Carotid artery disease ([place])    a. Carotid duplex 03/2014: stable 1-39% BICA, f/u due 03/2016.  [name] Chronic alcoholism in remission ([place]) 03/29/2011   Did not tolerate Klonopin, caused some confusion and bad dreams.    [name] COPD (chronic obstructive pulmonary disease) ([place])   . Dermatitis 11/23/2012  . EE (eosinophilic esophagitis)   . Esophageal ring   . ESOPHAGEAL STRICTURE 03/29/2009  . Fall 11/23/2012  . Family history of breast cancer   . Family history of colon cancer   . Family history of ovarian cancer   . Family history of pancreatic cancer   . Folliculitis of nose 9/79/8921  . GERD (gastroesophageal reflux disease) 09/29/2009   improved s/p cholecystectomy and esophagus dilatation  . History of chicken pox   . History of measles   . History of shingles    2 episodes  . Hx of echocardiogram    a. Echo 01/2013: mild LVH, EF 55-60%, normal wall motion, Gr 1 diast dysfn  . Hyperlipidemia   . Knee pain, bilateral 07/23/2011  . Medicare annual wellness visit, subsequent 06/19/2015  . Mixed hyperlipidemia 10/17/2010   Qualifier: Diagnosis of  By: Mack Guise    . Orthostasis   . Osteopenia 03/14/2011  . Personal history of chemotherapy 2001  . Personal history of radiation therapy 2001   rt breast  . 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 PVC's (premature ventricular contractions)    a. Event monitor 01/2013: NSR, extensive PVCs.  . Radial neck fracture 10/2011   minimally displaced  . Urinary incontinence 03/19/2012  . Vaginitis 05/22/2011     SURGICAL HISTORY: Past Surgical History:  Procedure Laterality Date  . AUGMENTATION MAMMAPLASTY Right 05/27/2007  . BREAST BIOPSY Right 01/02/2007   wire loc  . BREAST BIOPSY  12/26/2006  . BREAST LUMPECTOMY Right 2001  . BREAST RECONSTRUCTION  2008, 2009, 2010  . BREAST REDUCTION WITH MASTOPEXY Left 05/30/2017   Procedure: LEFT BREAST REDUCTION FOR SYMTERY WITH MASTOPEXY;  Surgeon: Wallace Going, DO;  Location: [place];  Service: Plastics;  Laterality: Left;  . CHOLECYSTECTOMY  2010  . COLONOSCOPY  09/05/10   cecal avm's  . DENTAL SURGERY  05/2016   4 dental implants by Dr. Loyal Gambler.  [name] ERCP  2010    CBD stone extraction   . ESOPHAGOGASTRODUODENOSCOPY  01/08/2012   Procedure: ESOPHAGOGASTRODUODENOSCOPY (EGD);  Surgeon: Gatha Mayer, MD;  Location: [facility];  Service: Endoscopy;  Laterality: N/A;  . ESOPHAGOGASTRODUODENOSCOPY (EGD) WITH ESOPHAGEAL DILATION  2010, 2012  . LAPAROSCOPIC APPENDECTOMY N/A 08/04/2016   Procedure: APPENDECTOMY LAPAROSCOPIC;  Surgeon: Michael Boston, MD;  Location: [facility];  Service: General;  Laterality: N/A;  . LIPOSUCTION WITH LIPOFILLING Left 11/21/2017   Procedure: LIPOSUCTION FROM ABDOMEN WITH LIPOFILLING TO LEFT BREAST;  Surgeon: Marla Roe,  Loel Lofty, DO;  Location: [place];  Service: Plastics;  Laterality: Left;  [name] MASTECTOMY MODIFIED RADICAL Right 05/27/2007   , Mastectomy modified radical (08), breast reconstruction, CA lesions excised lateral abd wall 2010  . MASTOPEXY Left 11/21/2017   Procedure: LEFT BREAST REVISION MASTOPEXY FOR SYMMETRY;  Surgeon: Wallace Going, DO;  Location: [place];  Service: Plastics;  Laterality: Left;  . REDUCTION MAMMAPLASTY Left   . SAVORY DILATION  01/08/2012   Procedure: SAVORY DILATION;  Surgeon: Gatha Mayer, MD;  Location: [facility];  Service: Endoscopy;  Laterality: N/A;  need xray    I have reviewed the social history and family history with  the patient and they are unchanged from previous note.  ALLERGIES:  is allergic to codeine; morphine; peanut-containing drug products; sorbitol; tomato; zofran; advil [ibuprofen]; diphenhydramine hcl; and oxycodone.  MEDICATIONS:  Current Outpatient Medications  Medication Sig Dispense Refill  . acetaminophen (TYLENOL) 500 MG tablet Take 500-1,000 mg by mouth every 6 (six) hours as needed (for pain.).    [name] amitriptyline (ELAVIL) 25 MG tablet TAKE 6 TABLETS(150 MG) BY MOUTH AT BEDTIME 180 tablet 1  . atorvastatin (LIPITOR) 20 MG tablet TAKE 1 TABLET(20 MG) BY MOUTH TWICE DAILY 180 tablet 3  . Biotin (BIOTIN 5000) 5 MG CAPS Take by mouth.    . Cholecalciferol (VITAMIN D3) 1000 UNITS CAPS Take 2,000 Units by mouth daily.     . Cyanocobalamin (VITAMIN B 12 PO) Take 1 tablet by mouth daily.    [name] diltiazem (CARDIZEM) 30 MG tablet TAKE 1 TABLET BY MOUTH ONCE DAILY FOR PALPITATIONS 90 tablet 3  . Ferrous Fumarate-Folic Acid 638-7 MG TABS Take 1 tablet by mouth daily. 30 each 3  . loratadine (CLARITIN) 10 MG tablet Take 1 tablet (10 mg total) by mouth daily. 30 tablet 11  . LORazepam (ATIVAN) 1 MG tablet TAKE 1 TABLET(1 MG) BY MOUTH EVERY 8 HOURS AS NEEDED FOR ANXIETY 70 tablet 1  . pantoprazole (PROTONIX) 40 MG tablet Take 1 tablet (40 mg total) by mouth daily. 90 tablet 1  . triamcinolone cream (KENALOG) 0.1 % Apply topically 2 (two) times daily. Apply to affected area 45 g 1   No current facility-administered medications for this visit.     PHYSICAL EXAMINATION: ECOG PERFORMANCE STATUS: 0 - Asymptomatic  Vitals:   06/10/19 1151  BP: 112/78  Pulse: 99  Resp: 18  Temp: 97.8 F (36.6 C)  SpO2: 95%   Filed Weights   06/10/19 1151  Weight: 135 lb 3.2 oz (61.3 kg)    GENERAL:alert, no distress and comfortable SKIN: skin color, texture, turgor are normal, no rashes or significant lesions EYES: normal, Conjunctiva are pink and non-injected, sclera clear  NECK: supple, thyroid normal  size, non-tender, without nodularity LYMPH:  no palpable lymphadenopathy in the cervical, axillary  LUNGS: clear to auscultation and percussion with normal breathing effort HEART: regular rate & rhythm and no murmurs and no lower extremity edema ABDOMEN:abdomen soft, non-tender and normal bowel sounds Musculoskeletal:no cyanosis of digits and no clubbing  NEURO: alert & oriented x 3 with fluent speech, no focal motor/sensory deficits BREAST:(+) S/p right mastectomy, reconstruction with implant: Surgical incisions healed well. No palpable mass, nodules or adenopathy bilaterally. Breast exam benign.   LABORATORY DATA:  I have reviewed the data as listed CBC Latest Ref Rng & Units 06/10/2019 04/28/2019 03/11/2019  WBC 4.0 - 10.5 K/uL 6.7 6.6 6.6  Hemoglobin 12.0 - 15.0 g/dL 12.3 12.7  11.9(L)  Hematocrit 36.0 - 46.0 % 38.0 38.5 37.8  Platelets 150 - 400 K/uL 255 267.0 252     CMP Latest Ref Rng & Units 06/10/2019 10/31/2018 09/10/2018  Glucose 70 - 99 mg/dL 96 103(H) 55(L)  BUN 8 - 23 mg/dL 21 14 15   Creatinine 0.44 - 1.00 mg/dL 0.77 0.72 0.84  Sodium 135 - 145 mmol/L 140 142 144  Potassium 3.5 - 5.1 mmol/L 3.9 3.8 4.0  Chloride 98 - 111 mmol/L 105 104 105  CO2 22 - 32 mmol/L 27 28 31   Calcium 8.9 - 10.3 mg/dL 9.7 9.2 9.7  Total Protein 6.5 - 8.1 g/dL 7.3 7.0 8.2(H)  Total Bilirubin 0.3 - 1.2 mg/dL 0.4 0.3 0.4  Alkaline Phos 38 - 126 U/L 90 125 138(H)  AST 15 - 41 U/L 17 17 20   ALT 0 - 44 U/L 17 18 18       RADIOGRAPHIC STUDIES: I have personally reviewed the radiological images as listed and agreed with the findings in the report. No results found.   ASSESSMENT & PLAN:  ENGLAND GREB is a 78 y.o. female with   1. History of recurrent right breast lobular carcinoma, in 2001, 2008 and chest wall recurrence in 2010 -She was initially diagnosed in 01/2000 but unfortunately had recurrence twice, the last time involving the chest wall in 2010.  -At that time she was treated with  Xeloda and Exemestane 2011-2015. She is now on surveillance.  -She has been anxious about another breast cancer or recurrence. She is interested in left mastectomy. I discussed given her age and comorbidities I do not recommend mastectomy and it is not medical necessary. I recommend adding breast MRI to screening. She is interested. Plan to start in 10/2019 and continue yearly.   -She is clinically doing well. Lab reviewed, her CBC and CMP are within normal limits. Her physical exam and her 05/2019 mammogram were unremarkable. There is no clinical concern for recurrence. -She notes her chronic back pain has worsened with tailbone pain in the last few months. No abnormalities upon exam today. Pt is very concerned. Will obtain a MRI lumbar spine to further evaluate. She is happy with that  -Continue Surveillance. Next mammogram in 05/2020.  -F/u in 6 months   2. Osteopenia -Her 06/2018 DEXA shows osteopenia with lowest T-score of -2.3 at AP spine which has progressed since last scan. Repeat in 06/2020.  -she will continue calcium and vitD    3.Anemiaof iron deficiency, secondary to AVM -shehas developed new anemia in 09/2018 with lab evidence of iron deficiency. -Herprior colonoscopy in 2014showed AVM in her Cecum, which may contribute to her anemia. -repeat colonoscopy from 10/2018 showed evidence of AVM which is the cause of her anemia. She also had several polyps found which were all benign.  -I discussed she may have recurrent bleeding with AVM which can cause recurrence anemia. Will monitor.  -She responded well to oral iron, anemia has resolved.   -Continue oral iron 1-2 times a day.  -She has significant epistaxis early this month. Has resolved. She did notes fresh blood in her mucus this morning, but no true bleeding. Will monitor.  -Hg normal today, Iron panel is still pending (06/10/19)  -she has stopped Aspirin 81mg  daily     PLAN: -Lumbar MRI in 2 weeks for her low back  pain  -screening breast MRI in 10/2019  -Lab and f/u in 6 months    No problem-specific Assessment & Plan notes found for  this encounter.   Orders Placed This Encounter  Procedures  . MR BREAST BILATERAL W WO CONTRAST INC CAD    History of right breast cancer, dense breast tissue, screening MRI for breast cancer    Standing Status:   Future    Standing Expiration Date:   08/10/2020    Order Specific Question:   If indicated for the ordered procedure, I authorize the administration of contrast media per Radiology protocol    Answer:   Yes    Order Specific Question:   What is the patient's sedation requirement?    Answer:   No Sedation    Order Specific Question:   Does the patient have a pacemaker or implanted devices?    Answer:   No    Order Specific Question:   Radiology Contrast Protocol - do NOT remove file path    Answer:   \\[email or web address]    Order Specific Question:   Preferred imaging location?    Answer:   [facility] (table limit-550lbs)  . MR LUMBAR SPINE WO CONTRAST    History of breast cancer, low back pain, rule out malignancy    Standing Status:   Future    Standing Expiration Date:   08/10/2020    Order Specific Question:   What is the patient's sedation requirement?    Answer:   No Sedation    Order Specific Question:   Does the patient have a pacemaker or implanted devices?    Answer:   No    Order Specific Question:   Preferred imaging location?    Answer:   [facility] (table limit-550lbs)    Order Specific Question:   Radiology Contrast Protocol - do NOT remove file path    Answer:   \\[email or web address]   All questions were answered. The patient knows to call the clinic with any problems, questions or concerns. No barriers to learning was detected. I spent 15 minutes counseling the patient face to face. The total time spent in the appointment was 20 minutes and more than 50% was on counseling  and review of test results     Truitt Merle, MD 06/10/2019   I, Joslyn Devon, am acting as scribe for Truitt Merle, MD.   I have reviewed the above documentation for accuracy and completeness, and I agree with the above.

## 2019-06-10 ENCOUNTER — Inpatient Hospital Stay: Payer: PPO

## 2019-06-10 ENCOUNTER — Encounter: Payer: Self-pay | Admitting: Hematology

## 2019-06-10 ENCOUNTER — Other Ambulatory Visit: Payer: Self-pay

## 2019-06-10 ENCOUNTER — Telehealth: Payer: Self-pay | Admitting: Hematology

## 2019-06-10 ENCOUNTER — Inpatient Hospital Stay: Payer: PPO | Attending: Hematology | Admitting: Hematology

## 2019-06-10 VITALS — BP 112/78 | HR 99 | Temp 97.8°F | Resp 18 | Ht 65.5 in | Wt 135.2 lb

## 2019-06-10 DIAGNOSIS — J449 Chronic obstructive pulmonary disease, unspecified: Secondary | ICD-10-CM

## 2019-06-10 DIAGNOSIS — Z79899 Other long term (current) drug therapy: Secondary | ICD-10-CM

## 2019-06-10 DIAGNOSIS — Z803 Family history of malignant neoplasm of breast: Secondary | ICD-10-CM

## 2019-06-10 DIAGNOSIS — F329 Major depressive disorder, single episode, unspecified: Secondary | ICD-10-CM | POA: Diagnosis not present

## 2019-06-10 DIAGNOSIS — Z853 Personal history of malignant neoplasm of breast: Secondary | ICD-10-CM | POA: Diagnosis not present

## 2019-06-10 DIAGNOSIS — D509 Iron deficiency anemia, unspecified: Secondary | ICD-10-CM | POA: Insufficient documentation

## 2019-06-10 DIAGNOSIS — Z170 Estrogen receptor positive status [ER+]: Secondary | ICD-10-CM | POA: Diagnosis not present

## 2019-06-10 DIAGNOSIS — F419 Anxiety disorder, unspecified: Secondary | ICD-10-CM | POA: Insufficient documentation

## 2019-06-10 DIAGNOSIS — Z9011 Acquired absence of right breast and nipple: Secondary | ICD-10-CM | POA: Insufficient documentation

## 2019-06-10 DIAGNOSIS — Z800 Family history of malignant neoplasm of digestive organs: Secondary | ICD-10-CM

## 2019-06-10 DIAGNOSIS — Z8041 Family history of malignant neoplasm of ovary: Secondary | ICD-10-CM

## 2019-06-10 DIAGNOSIS — D649 Anemia, unspecified: Secondary | ICD-10-CM

## 2019-06-10 DIAGNOSIS — Z7982 Long term (current) use of aspirin: Secondary | ICD-10-CM | POA: Insufficient documentation

## 2019-06-10 LAB — CBC WITH DIFFERENTIAL (CANCER CENTER ONLY)
Abs Immature Granulocytes: 0.02 10*3/uL (ref 0.00–0.07)
Basophils Absolute: 0 10*3/uL (ref 0.0–0.1)
Basophils Relative: 1 %
Eosinophils Absolute: 0.1 10*3/uL (ref 0.0–0.5)
Eosinophils Relative: 2 %
HCT: 38 % (ref 36.0–46.0)
Hemoglobin: 12.3 g/dL (ref 12.0–15.0)
Immature Granulocytes: 0 %
Lymphocytes Relative: 23 %
Lymphs Abs: 1.5 10*3/uL (ref 0.7–4.0)
MCH: 30.3 pg (ref 26.0–34.0)
MCHC: 32.4 g/dL (ref 30.0–36.0)
MCV: 93.6 fL (ref 80.0–100.0)
Monocytes Absolute: 0.5 10*3/uL (ref 0.1–1.0)
Monocytes Relative: 8 %
Neutro Abs: 4.5 10*3/uL (ref 1.7–7.7)
Neutrophils Relative %: 66 %
Platelet Count: 255 10*3/uL (ref 150–400)
RBC: 4.06 MIL/uL (ref 3.87–5.11)
RDW: 13.8 % (ref 11.5–15.5)
WBC Count: 6.7 10*3/uL (ref 4.0–10.5)
nRBC: 0 % (ref 0.0–0.2)

## 2019-06-10 LAB — CMP (CANCER CENTER ONLY)
ALT: 17 U/L (ref 0–44)
AST: 17 U/L (ref 15–41)
Albumin: 3.8 g/dL (ref 3.5–5.0)
Alkaline Phosphatase: 90 U/L (ref 38–126)
Anion gap: 8 (ref 5–15)
BUN: 21 mg/dL (ref 8–23)
CO2: 27 mmol/L (ref 22–32)
Calcium: 9.7 mg/dL (ref 8.9–10.3)
Chloride: 105 mmol/L (ref 98–111)
Creatinine: 0.77 mg/dL (ref 0.44–1.00)
GFR, Est AFR Am: >60 mL/min (ref >60)
GFR, Estimated: >60 mL/min (ref >60)
Glucose, Bld: 96 mg/dL (ref 70–99)
Potassium: 3.9 mmol/L (ref 3.5–5.1)
Sodium: 140 mmol/L (ref 135–145)
Total Bilirubin: 0.4 mg/dL (ref 0.3–1.2)
Total Protein: 7.3 g/dL (ref 6.5–8.1)

## 2019-06-10 LAB — FERRITIN: Ferritin: 26 ng/mL (ref 11–307)

## 2019-06-10 LAB — IRON AND TIBC
Iron: 52 ug/dL (ref 41–142)
Saturation Ratios: 17 % — ABNORMAL LOW (ref 21–57)
TIBC: 315 ug/dL (ref 236–444)
UIBC: 263 ug/dL (ref 120–384)

## 2019-06-10 NOTE — Telephone Encounter (Signed)
Scheduled appt per 7/29 los. ° °Printed and mailed appt calendar. °

## 2019-06-10 NOTE — Telephone Encounter (Deleted)
Scheduled appt per 7/29 los. ° °Printed calendar and avs. °

## 2019-06-11 ENCOUNTER — Encounter: Payer: Self-pay | Admitting: Hematology

## 2019-06-11 NOTE — Telephone Encounter (Signed)
Dr. Vaughan Browner,  Please advise on this pt email:  [phone]  Dr. Vaughan Browner:  Considering my severe lung diseases, do I qualify for an n95 mask?  Anne [name]

## 2019-06-11 NOTE — Telephone Encounter (Signed)
I feel a normal face mask when properly worn with social distancing would be effective without need for N95 mask. The N95 is recommended only for certain high risk medical procedures and in health care settings.

## 2019-06-12 ENCOUNTER — Encounter: Payer: Self-pay | Admitting: Hematology

## 2019-06-16 ENCOUNTER — Other Ambulatory Visit: Payer: Self-pay | Admitting: Hematology

## 2019-06-16 DIAGNOSIS — H353212 Exudative age-related macular degeneration, right eye, with inactive choroidal neovascularization: Secondary | ICD-10-CM | POA: Diagnosis not present

## 2019-06-16 DIAGNOSIS — H353211 Exudative age-related macular degeneration, right eye, with active choroidal neovascularization: Secondary | ICD-10-CM | POA: Diagnosis not present

## 2019-06-16 DIAGNOSIS — H353131 Nonexudative age-related macular degeneration, bilateral, early dry stage: Secondary | ICD-10-CM | POA: Diagnosis not present

## 2019-06-16 DIAGNOSIS — Z853 Personal history of malignant neoplasm of breast: Secondary | ICD-10-CM

## 2019-06-19 ENCOUNTER — Emergency Department
Admission: EM | Admit: 2019-06-19 | Discharge: 2019-06-19 | Disposition: A | Discharge status: Home / Self Care | Payer: PPO | Source: Home / Self Care

## 2019-06-19 ENCOUNTER — Other Ambulatory Visit: Payer: Self-pay

## 2019-06-19 DIAGNOSIS — B86 Scabies: Secondary | ICD-10-CM

## 2019-06-19 MED ORDER — PERMETHRIN 5 % EX CREA: TOPICAL_CREAM | CUTANEOUS | 1 refills | Status: DC

## 2019-06-19 NOTE — Discharge Instructions (Signed)
°  Keep rash clean with warm water and mild soap. Pat dry areas, do not rub.  Please use prescription cream to help get rid of the scabies.  Follow up with family medicine next week if not improving.

## 2019-06-19 NOTE — ED Provider Notes (Signed)
[name]    CSN: [id number] Arrival date & time: 06/19/19  1421     History   Chief Complaint Chief Complaint  Patient presents with   Insect Bite    HPI Anne [name] is a 78 y.o. female.   HPI Anne [name] is a 78 y.o. female presenting to UC with c/o multiple bug bites and pustules on feet, lower legs and thighs that started about 1 week ago. Rash is very itchy.  Some of the pustules have started to heal while new ones have started on her Left foot. Itching is worst at night. She has used triamcinolone cream with mild relief. She has cleaned her entire bedroom and has not seen any bedbugs but is concerned that is what is causing the bites. She has feral cats but states they never come inside. No contact with others with a rash. No other symptoms. No cough, congestion, fever, chills, myalgias or arthralgias.    Past Medical History:  Diagnosis Date   ALKALINE PHOSPHATASE, ELEVATED 03/15/2009   Allergic state 06/10/2012   Anemia    Anxiety and depression 04/28/2011   Arthritis    Atypical chest pain 11/30/2011   AVM (arteriovenous malformation) of colon 2011   cecum   Baker's cyst of knee 05/22/2011   Cancer ([place]) 01,  08   XRT/chemo 01-02/ lobular invasive ca   Carotid artery disease ([place])    a. Carotid duplex 03/2014: stable 1-39% BICA, f/u due 03/2016.   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     COPD (chronic obstructive pulmonary disease) (HCC)    Dermatitis 0/81/4481   EE (eosinophilic esophagitis)    Esophageal ring    ESOPHAGEAL STRICTURE 03/29/2009   Fall 11/23/2012   Family history of breast cancer    Family history of colon cancer    Family history of ovarian cancer    Family history of pancreatic cancer    Folliculitis of nose 8/56/3149   GERD (gastroesophageal reflux disease) 09/29/2009   improved s/p cholecystectomy and esophagus dilatation     History of chicken pox    History of measles    History of shingles    2 episodes   Hx of echocardiogram    a. Echo 01/2013: mild LVH, EF 55-60%, normal wall motion, Gr 1 diast dysfn   Hyperlipidemia    Knee pain, bilateral 07/23/2011   Medicare annual wellness visit, subsequent 06/19/2015   Mixed hyperlipidemia 10/17/2010   Qualifier: Diagnosis of  By: Mack Guise     Orthostasis    Osteopenia 03/14/2011   Personal history of chemotherapy 2001   Personal history of radiation therapy 2001   rt breast   PERSONAL HX BREAST CANCER 09/29/2009   PVC's (premature ventricular contractions)    a. Event monitor 01/2013: NSR, extensive PVCs.   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   Palpitations 04/30/2018   Skin lesion of face 02/07/2018   Low back pain 09/23/2017   Hypokalemia 08/11/2017   Abdominal pain 08/11/2017   SBO (small bowel obstruction) ([place]) 08/10/2017   History of right breast cancer 03/21/2017  Genetic testing 09/13/2016   Family history of breast cancer    Family history of pancreatic cancer    Family history of colon cancer    Pain in the chest 05/16/2016   Status post right breast reconstruction 05/11/2016   Breast cancer 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 ([place]) 05/11/2016   History of breast cancer in female 05/11/2016   Osteopenia determined by x-ray 05/11/2016   IBS (irritable bowel syndrome) 03/09/2016   Dyspnea 06/19/2015   Medicare annual wellness visit, subsequent 06/19/2015   Urinary frequency 12/12/2014   Breast cancer, right breast ([place]) 10/18/2014   Superficial thrombophlebitis 03/16/2014   Plant  dermatitis 03/16/2014   Chest wall pain 02/07/2014   Primary localized osteoarthrosis, lower leg 01/21/2014   Elevated sed rate 08/02/2013   Dermatitis 11/23/2012   Preventative health care 10/21/2012   Allergy 06/10/2012   Urinary incontinence 03/19/2012   Anemia 12/21/2011   Knee pain, bilateral 07/23/2011   Baker's cyst of knee 58/07/9832   Eosinophilic esophagitis 82/50/5397   Anxiety and depression 04/28/2011   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   Constipation, chronic 03/15/2011   Mixed hyperlipidemia 10/17/2010   CAROTID ARTERY STENOSIS 01/13/2010   GERD 09/29/2009   PERSONAL HX BREAST CANCER 09/29/2009    Past Surgical History:  Procedure Laterality Date   AUGMENTATION MAMMAPLASTY Right 05/27/2007   BREAST BIOPSY Right 01/02/2007   wire loc   BREAST BIOPSY  12/26/2006   BREAST LUMPECTOMY Right 2001   BREAST RECONSTRUCTION  2008, 2009, 2010   BREAST REDUCTION WITH MASTOPEXY Left 05/30/2017   Procedure: LEFT BREAST REDUCTION FOR SYMTERY WITH MASTOPEXY;  Surgeon: Wallace Going, DO;  Location: [facility];  Service: Plastics;  Laterality: Left;   CHOLECYSTECTOMY  2010   COLONOSCOPY  09/05/10   [facility]   DENTAL SURGERY  05/2016   4 dental implants by Dr. Loyal Gambler.   ERCP  2010    CBD stone extraction    ESOPHAGOGASTRODUODENOSCOPY  01/08/2012   Procedure: ESOPHAGOGASTRODUODENOSCOPY (EGD);  Surgeon: Gatha Mayer, MD;  Location: [facility];  Service: Endoscopy;  Laterality: N/A;   ESOPHAGOGASTRODUODENOSCOPY (EGD) WITH ESOPHAGEAL DILATION  2010, 2012   LAPAROSCOPIC APPENDECTOMY N/A 08/04/2016   Procedure: APPENDECTOMY LAPAROSCOPIC;  Surgeon: Michael Boston, MD;  Location: [facility];  Service: General;  Laterality: N/A;   LIPOSUCTION WITH LIPOFILLING Left 11/21/2017   Procedure: LIPOSUCTION FROM ABDOMEN WITH LIPOFILLING TO LEFT BREAST;  Surgeon: Wallace Going, DO;  Location: [facility];   Service: Plastics;  Laterality: Left;   MASTECTOMY MODIFIED RADICAL Right 05/27/2007   , Mastectomy modified radical (08), breast reconstruction, CA lesions excised lateral abd wall 2010   MASTOPEXY Left 11/21/2017   Procedure: LEFT BREAST REVISION MASTOPEXY FOR SYMMETRY;  Surgeon: Wallace Going, DO;  Location: [facility];  Service: Plastics;  Laterality: Left;   REDUCTION MAMMAPLASTY Left    SAVORY DILATION  01/08/2012   Procedure: SAVORY DILATION;  Surgeon: Gatha Mayer, MD;  Location: [facility];  Service: Endoscopy;  Laterality: N/A;  need xray    OB History   No obstetric history on file.      Home Medications    Prior to Admission medications   Medication Sig Start Date End Date Taking? Authorizing Provider  acetaminophen (TYLENOL) 500 MG tablet Take 500-1,000 mg by mouth every 6 (six) hours as needed (for pain.).    [provider]  amitriptyline (ELAVIL) 25 MG tablet TAKE 6 TABLETS(150 MG) BY MOUTH AT BEDTIME  05/18/19   [name], MD  atorvastatin (LIPITOR) 20 MG tablet TAKE 1 TABLET(20 MG) BY MOUTH TWICE DAILY 02/23/19   [name], MD  Biotin (BIOTIN 5000) 5 MG CAPS Take by mouth.    [provider]  Cholecalciferol (VITAMIN D3) 1000 UNITS CAPS Take 2,000 Units by mouth daily.     [provider]  Cyanocobalamin (VITAMIN B 12 PO) Take 1 tablet by mouth daily.    [provider]  diltiazem (CARDIZEM) 30 MG tablet TAKE 1 TABLET BY MOUTH ONCE DAILY FOR PALPITATIONS 05/05/18   Fay Records, MD  Ferrous Fumarate-Folic Acid 235-5 MG TABS Take 1 tablet by mouth daily. 09/03/18   [name], MD  loratadine (CLARITIN) 10 MG tablet Take 1 tablet (10 mg total) by mouth daily. 04/29/18   [name], MD  LORazepam (ATIVAN) 1 MG tablet TAKE 1 TABLET(1 MG) BY MOUTH EVERY 8 HOURS AS NEEDED FOR ANXIETY 05/01/19   [name], MD  pantoprazole (PROTONIX) 40 MG tablet Take 1 tablet (40 mg total) by mouth daily.  01/14/19   Colon Branch, MD  permethrin (ELIMITE) 5 % cream Apply from scalp to toes, avoid face. Leave on 8-14 hours. Thoroughly rinse with soap and water. Repeat in 2 weeks if needed. 06/19/19   Noe Gens, PA-C  triamcinolone cream (KENALOG) 0.1 % Apply topically 2 (two) times daily. Apply to affected area 04/15/19   [name], MD    Family History Family History  Problem Relation Age of Onset   Heart disease Father    Lung cancer Father        smoker   Cirrhosis Sister        Primary Biliary   Stroke Maternal Grandmother    Alcohol abuse Maternal Grandfather    Heart disease Paternal Grandfather    Anxiety disorder Sister    Osteoporosis Sister    Arthritis Sister        Rheumatoid   Osteoporosis Sister    Skin cancer Sister        multiple skin cancers, over 57 excisions.   Other Mother        tic [place]   Breast cancer Maternal Aunt        dx in her 35s   Breast cancer Paternal Aunt        dx in her 66s   Pancreatic cancer Maternal Uncle        dx in his 68s; smoker   Breast cancer Maternal Aunt        dx in her 29s   Breast cancer Maternal Aunt        possible breast cancer dx and died in her 68s   Ovarian cancer Maternal Aunt 29   Pancreatic cancer Maternal Aunt        dx in her 84s   Stomach cancer Maternal Uncle    Colon cancer Maternal Uncle    Lung cancer Paternal Aunt    Breast cancer Cousin        paternal first cousin   Breast cancer Cousin        maternal first cousin   Anesthesia problems Neg Hx    Hypotension Neg Hx    Malignant hyperthermia Neg Hx    Pseudochol deficiency Neg Hx     Social History Social History   Tobacco Use   Smoking status: Former Smoker    Packs/day: 1.00    Years: 50.00  Pack years: 50.00    Types: Cigarettes    Quit date: 05/22/2007    Years since quitting: 12.0   Smokeless tobacco: Never Used  Substance Use Topics   Alcohol use: Not Currently   Drug use: No      Allergies   Codeine, Morphine, Peanut-containing drug products, Sorbitol, Tomato, Zofran, Advil [ibuprofen], Diphenhydramine hcl, and Oxycodone   Review of Systems Review of Systems  Constitutional: Negative for chills and fever.  Gastrointestinal: Negative for diarrhea, nausea and vomiting.  Musculoskeletal: Negative for arthralgias, joint swelling and myalgias.  Skin: Positive for color change and wound.  Neurological: Negative for weakness and numbness.     Physical Exam Triage Vital Signs ED Triage Vitals  Enc Vitals Group     BP 06/19/19 1449 104/68     Pulse Rate 06/19/19 1449 95     Resp 06/19/19 1449 18     Temp 06/19/19 1449 98.1 F (36.7 C)     Temp Source 06/19/19 1449 Oral     SpO2 06/19/19 1449 96 %     Weight 06/19/19 1450 134 lb (60.8 kg)     Height 06/19/19 1450 5' 5.5" (1.664 m)     Head Circumference --      Peak Flow --      Pain Score 06/19/19 1450 2     Pain Loc --      Pain Edu? --      Excl. in [place]? --    No data found.  Updated Vital Signs BP 104/68 (BP Location: Right Arm)    Pulse 95    Temp 98.1 F (36.7 C) (Oral)    Resp 18    Ht 5' 5.5" (1.664 m)    Wt 134 lb (60.8 kg)    SpO2 96%    BMI 21.96 kg/m   Visual Acuity Right Eye Distance:   Left Eye Distance:   Bilateral Distance:    Right Eye Near:   Left Eye Near:    Bilateral Near:     Physical Exam Vitals signs and nursing note reviewed.  Constitutional:      Appearance: Normal appearance. She is well-developed.  HENT:     Head: Normocephalic and atraumatic.  Neck:     Musculoskeletal: Normal range of motion.  Cardiovascular:     Rate and Rhythm: Normal rate.  Pulmonary:     Effort: Pulmonary effort is normal.  Musculoskeletal: Normal range of motion.        General: No swelling or tenderness.  Skin:    General: Skin is warm and dry.     Capillary Refill: Capillary refill takes less than 2 seconds.     Findings: Erythema and rash present.     Comments: Bilateral  feet: multiple erythematous vesicles and pustules. Predominantly on and between toes, and dorsal distal aspect of feet.  Scattered flat erythematous papular lesions on lower legs and thighs.   Neurological:     Mental Status: She is alert and oriented to person, place, and time.  Psychiatric:        Behavior: Behavior normal.      UC Treatments / Results  Labs (all labs ordered are listed, but only abnormal results are displayed) Labs Reviewed - No data to display  EKG   Radiology No results found.  Procedures Procedures (including critical care time)  Medications Ordered in UC Medications - No data to display  Initial Impression / Assessment and Plan / UC Course  I have reviewed  the triage vital signs and the nursing notes.  Pertinent labs & imaging results that were available during my care of the patient were reviewed by me and considered in my medical decision making (see chart for details).     Hx and exam, itching worse at night and location of bites, most c/w scabies Will tx with permethrin cream AVS provided.  Final Clinical Impressions(s) / UC Diagnoses   Final diagnoses:  Scabies     Discharge Instructions      Keep rash clean with warm water and mild soap. Pat dry areas, do not rub.  Please use prescription cream to help get rid of the scabies.  Follow up with family medicine next week if not improving.     ED Prescriptions    Medication Sig Dispense Auth. Provider   permethrin (ELIMITE) 5 % cream Apply from scalp to toes, avoid face. Leave on 8-14 hours. Thoroughly rinse with soap and water. Repeat in 2 weeks if needed. 60 g Noe Gens, PA-C     Controlled Substance Prescriptions [place] Controlled Substance Registry consulted? Not Applicable   Tyrell Antonio 06/19/19 [id number]

## 2019-06-19 NOTE — ED Triage Notes (Signed)
Pt c/o insect bites all over her toes and top of feet. Says they started last thurs while shes in bed. Has done a thorough check of bedding and found nothing. Mentions her first few bites occurred on her inner thighs. States she itches a lot at night, tried triamcinolone cream with little relief.

## 2019-06-20 ENCOUNTER — Encounter: Payer: Self-pay | Admitting: Family Medicine

## 2019-06-26 ENCOUNTER — Encounter: Payer: Self-pay | Admitting: Family Medicine

## 2019-06-30 NOTE — Telephone Encounter (Signed)
Called patient let voicemail for patient to call the office to schedule with another physician in the office today to be seen for her fall and to have xrays ordered. Dr. Charlett Blake does not have any openings today however patient can schedule with Percell Miller or Lenna Sciara today, so the xrays can be ordered. Nurse Triage may handle

## 2019-06-30 NOTE — Telephone Encounter (Signed)
Left message on machine for patient to call back to schedule appt as soon as possible.  Advise that Dr. Etter Sjogren may have openings for this evening for her to be evaluated.  Also mychart message sent.

## 2019-07-01 ENCOUNTER — Other Ambulatory Visit: Payer: Self-pay | Admitting: Internal Medicine

## 2019-07-13 ENCOUNTER — Ambulatory Visit: Payer: PPO | Admitting: Internal Medicine

## 2019-07-16 ENCOUNTER — Telehealth: Payer: Self-pay

## 2019-07-16 ENCOUNTER — Other Ambulatory Visit: Payer: Self-pay | Admitting: Family Medicine

## 2019-07-16 NOTE — Telephone Encounter (Signed)
Patient calls stating having pain in right breast bone, had been having off and on, today much worse, has not taken anything for the pain but plans to take some Tylenol.  She would like Dr. Ernestina Penna advice on this matter.  Her [phone]

## 2019-07-17 ENCOUNTER — Other Ambulatory Visit: Payer: Self-pay | Admitting: Family Medicine

## 2019-07-17 DIAGNOSIS — S299XXA Unspecified injury of thorax, initial encounter: Secondary | ICD-10-CM

## 2019-07-17 NOTE — Telephone Encounter (Signed)
LM to schedule f/u appt w/in 60 days with Dr. Charlett Blake

## 2019-07-17 NOTE — Telephone Encounter (Signed)
Please schedule patient appointment with PCP

## 2019-07-17 NOTE — Telephone Encounter (Signed)
Patient need to schedule an ov for more refills. 

## 2019-07-17 NOTE — Telephone Encounter (Signed)
Requesting:ativan  Contract:yes UDS:n/a Last OV:05/29/19 Next OV:n/a Last Refill:05/01/19  #[id number] Database:   Please advise

## 2019-07-21 ENCOUNTER — Encounter: Payer: Self-pay | Admitting: Hematology

## 2019-07-22 NOTE — Telephone Encounter (Signed)
Lacie or Dr. Burr Medico, please advise.   *Copy to Providers collaborative for the day at this time.

## 2019-07-27 ENCOUNTER — Other Ambulatory Visit: Payer: Self-pay | Admitting: Internal Medicine

## 2019-07-30 ENCOUNTER — Other Ambulatory Visit: Payer: Self-pay

## 2019-07-30 ENCOUNTER — Ambulatory Visit
Admission: RE | Admit: 2019-07-30 | Discharge: 2019-07-30 | Disposition: A | Discharge status: Home / Self Care | Payer: PPO | Source: Ambulatory Visit | Attending: Hematology | Admitting: Hematology

## 2019-07-30 DIAGNOSIS — M5127 Other intervertebral disc displacement, lumbosacral region: Secondary | ICD-10-CM | POA: Diagnosis not present

## 2019-07-30 DIAGNOSIS — Z853 Personal history of malignant neoplasm of breast: Secondary | ICD-10-CM

## 2019-07-30 MED ORDER — GADOBENATE DIMEGLUMINE 529 MG/ML IV SOLN
12.0000 mL | Freq: Once | INTRAVENOUS | Status: AC | PRN
  Administered 2019-07-30: 12 mL via INTRAVENOUS

## 2019-08-03 ENCOUNTER — Encounter: Payer: Self-pay | Admitting: Family Medicine

## 2019-08-03 ENCOUNTER — Telehealth: Payer: Self-pay

## 2019-08-03 NOTE — Telephone Encounter (Signed)
Called and spoke with patient about most recent MRI scan. Informed her of radiologist's impression and that Dr. Burr Medico recommends following up with her PCP for further management. Pt verbalized understanding and agreement, and denied any further needs at this time.

## 2019-08-03 NOTE — Telephone Encounter (Signed)
-----   Message from Truitt Merle, MD sent at 08/01/2019  4:58 PM EDT ----- Please let pt know her lumbar spine MRI results, I recommend her to follow up with PCP, thanks   Truitt Merle  08/01/2019

## 2019-08-06 ENCOUNTER — Encounter: Payer: Self-pay | Admitting: Family Medicine

## 2019-08-06 ENCOUNTER — Ambulatory Visit (HOSPITAL_BASED_OUTPATIENT_CLINIC_OR_DEPARTMENT_OTHER)
Admission: RE | Admit: 2019-08-06 | Discharge: 2019-08-06 | Disposition: A | Discharge status: Home / Self Care | Payer: PPO | Source: Ambulatory Visit | Attending: Family Medicine | Admitting: Family Medicine

## 2019-08-06 ENCOUNTER — Other Ambulatory Visit: Payer: Self-pay

## 2019-08-06 ENCOUNTER — Ambulatory Visit (INDEPENDENT_AMBULATORY_CARE_PROVIDER_SITE_OTHER): Payer: PPO | Admitting: Family Medicine

## 2019-08-06 VITALS — BP 117/71 | HR 93 | Temp 97.4°F | Resp 16 | Ht 65.5 in | Wt 132.8 lb

## 2019-08-06 DIAGNOSIS — W19XXXS Unspecified fall, sequela: Secondary | ICD-10-CM | POA: Diagnosis not present

## 2019-08-06 DIAGNOSIS — R519 Headache, unspecified: Secondary | ICD-10-CM

## 2019-08-06 DIAGNOSIS — S0993XS Unspecified injury of face, sequela: Secondary | ICD-10-CM | POA: Diagnosis not present

## 2019-08-06 DIAGNOSIS — K219 Gastro-esophageal reflux disease without esophagitis: Secondary | ICD-10-CM

## 2019-08-06 DIAGNOSIS — R700 Elevated erythrocyte sedimentation rate: Secondary | ICD-10-CM | POA: Diagnosis not present

## 2019-08-06 DIAGNOSIS — R739 Hyperglycemia, unspecified: Secondary | ICD-10-CM | POA: Diagnosis not present

## 2019-08-06 DIAGNOSIS — M545 Low back pain, unspecified: Secondary | ICD-10-CM

## 2019-08-06 DIAGNOSIS — E782 Mixed hyperlipidemia: Secondary | ICD-10-CM

## 2019-08-06 DIAGNOSIS — R002 Palpitations: Secondary | ICD-10-CM | POA: Diagnosis not present

## 2019-08-06 DIAGNOSIS — R51 Headache: Secondary | ICD-10-CM

## 2019-08-06 DIAGNOSIS — H57811 Brow ptosis, right: Secondary | ICD-10-CM | POA: Diagnosis not present

## 2019-08-06 DIAGNOSIS — D649 Anemia, unspecified: Secondary | ICD-10-CM

## 2019-08-06 DIAGNOSIS — F1021 Alcohol dependence, in remission: Secondary | ICD-10-CM | POA: Diagnosis not present

## 2019-08-06 DIAGNOSIS — H5711 Ocular pain, right eye: Secondary | ICD-10-CM | POA: Diagnosis not present

## 2019-08-06 NOTE — Patient Instructions (Signed)
Acute Back Pain, Adult Acute back pain is sudden and usually short-lived. It is often caused by an injury to the muscles and tissues in the back. The injury may result from:  A muscle or ligament getting overstretched or torn (strained). Ligaments are tissues that connect bones to each other. Lifting something improperly can cause a back strain.  Wear and tear (degeneration) of the spinal disks. Spinal disks are circular tissue that provides cushioning between the bones of the spine (vertebrae).  Twisting motions, such as while playing sports or doing yard work.  A hit to the back.  Arthritis. You may have a physical exam, lab tests, and imaging tests to find the cause of your pain. Acute back pain usually goes away with rest and home care. Follow these instructions at home: Managing pain, stiffness, and swelling  Take over-the-counter and prescription medicines only as told by your health care provider.  Your health care provider may recommend applying ice during the first 24-48 hours after your pain starts. To do this: ? Put ice in a plastic bag. ? Place a towel between your skin and the bag. ? Leave the ice on for 20 minutes, 2-3 times a day.  If directed, apply heat to the affected area as often as told by your health care provider. Use the heat source that your health care provider recommends, such as a moist heat pack or a heating pad. ? Place a towel between your skin and the heat source. ? Leave the heat on for 20-30 minutes. ? Remove the heat if your skin turns bright red. This is especially important if you are unable to feel pain, heat, or cold. You have a greater risk of getting burned. Activity   Do not stay in bed. Staying in bed for more than 1-2 days can delay your recovery.  Sit up and stand up straight. Avoid leaning forward when you sit, or hunching over when you stand. ? If you work at a desk, sit close to it so you do not need to lean over. Keep your chin tucked  in. Keep your neck drawn back, and keep your elbows bent at a right angle. Your arms should look like the letter "L." ? Sit high and close to the steering wheel when you drive. Add lower back (lumbar) support to your car seat, if needed.  Take short walks on even surfaces as soon as you are able. Try to increase the length of time you walk each day.  Do not sit, drive, or stand in one place for more than 30 minutes at a time. Sitting or standing for long periods of time can put stress on your back.  Do not drive or use heavy machinery while taking prescription pain medicine.  Use proper lifting techniques. When you bend and lift, use positions that put less stress on your back: ? Bend your knees. ? Keep the load close to your body. ? Avoid twisting.  Exercise regularly as told by your health care provider. Exercising helps your back heal faster and helps prevent back injuries by keeping muscles strong and flexible.  Work with a physical therapist to make a safe exercise program, as recommended by your health care provider. Do any exercises as told by your physical therapist. Lifestyle  Maintain a healthy weight. Extra weight puts stress on your back and makes it difficult to have good posture.  Avoid activities or situations that make you feel anxious or stressed. Stress and anxiety increase muscle   tension and can make back pain worse. Learn ways to manage anxiety and stress, such as through exercise. General instructions  Sleep on a firm mattress in a comfortable position. Try lying on your side with your knees slightly bent. If you lie on your back, put a pillow under your knees.  Follow your treatment plan as told by your health care provider. This may include: ? Cognitive or behavioral therapy. ? Acupuncture or massage therapy. ? Meditation or yoga. Contact a health care provider if:  You have pain that is not relieved with rest or medicine.  You have increasing pain going down  into your legs or buttocks.  Your pain does not improve after 2 weeks.  You have pain at night.  You lose weight without trying.  You have a fever or chills. Get help right away if:  You develop new bowel or bladder control problems.  You have unusual weakness or numbness in your arms or legs.  You develop nausea or vomiting.  You develop abdominal pain.  You feel faint. Summary  Acute back pain is sudden and usually short-lived.  Use proper lifting techniques. When you bend and lift, use positions that put less stress on your back.  Take over-the-counter and prescription medicines and apply heat or ice as directed by your health care provider. This information is not intended to replace advice given to you by your health care provider. Make sure you discuss any questions you have with your health care provider. Document Released: 10/29/2005 Document Revised: 02/17/2019 Document Reviewed: 06/12/2017 Elsevier Patient Education  2020 [employer]  

## 2019-08-07 ENCOUNTER — Ambulatory Visit: Payer: PPO | Admitting: Family Medicine

## 2019-08-07 ENCOUNTER — Other Ambulatory Visit: Payer: Self-pay

## 2019-08-07 DIAGNOSIS — M545 Low back pain, unspecified: Secondary | ICD-10-CM

## 2019-08-07 LAB — COMPREHENSIVE METABOLIC PANEL
ALT: 15 U/L (ref 0–35)
AST: 19 U/L (ref 0–37)
Albumin: 4.3 g/dL (ref 3.5–5.2)
Alkaline Phosphatase: 96 U/L (ref 39–117)
BUN: 19 mg/dL (ref 6–23)
CO2: 31 mEq/L (ref 19–32)
Calcium: 9.9 mg/dL (ref 8.4–10.5)
Chloride: 100 mEq/L (ref 96–112)
Creatinine, Ser: 0.63 mg/dL (ref 0.40–1.20)
GFR: 91.65 mL/min (ref >60.00)
Glucose, Bld: 88 mg/dL (ref 70–99)
Potassium: 4.5 mEq/L (ref 3.5–5.1)
Sodium: 140 mEq/L (ref 135–145)
Total Bilirubin: 0.3 mg/dL (ref 0.2–1.2)
Total Protein: 7 g/dL (ref 6.0–8.3)

## 2019-08-07 LAB — LIPID PANEL
Cholesterol: 161 mg/dL (ref 0–200)
HDL: 58.4 mg/dL (ref >39.00)
LDL Cholesterol: 73 mg/dL (ref 0–99)
NonHDL: 102.34
Total CHOL/HDL Ratio: 3
Triglycerides: 147 mg/dL (ref 0.0–149.0)
VLDL: 29.4 mg/dL (ref 0.0–40.0)

## 2019-08-07 LAB — HEMOGLOBIN A1C: Hgb A1c MFr Bld: 5.8 % (ref 4.6–6.5)

## 2019-08-07 LAB — TSH: TSH: 2.96 u[IU]/mL (ref 0.35–4.50)

## 2019-08-07 LAB — SEDIMENTATION RATE: Sed Rate: 48 mm/hr — ABNORMAL HIGH (ref 0–30)

## 2019-08-09 DIAGNOSIS — S0993XS Unspecified injury of face, sequela: Secondary | ICD-10-CM | POA: Insufficient documentation

## 2019-08-09 NOTE — Assessment & Plan Note (Signed)
Tolerating statin, encouraged heart healthy diet, avoid trans fats, minimize simple carbs and saturated fats. Increase exercise as tolerated 

## 2019-08-09 NOTE — Assessment & Plan Note (Signed)
hgba1c acceptable, minimize simple carbs. Increase exercise as tolerated. Continue current meds 

## 2019-08-09 NOTE — Assessment & Plan Note (Signed)
Persistent but no notable change

## 2019-08-09 NOTE — Assessment & Plan Note (Signed)
Has tenderness over her outer right eyebrow since she fell recently. No visual changes or headache. Xray of facial bones shows no fracture she will report any new symptoms.

## 2019-08-09 NOTE — Progress Notes (Signed)
Subjective:    Patient ID: Anne [name], female    DOB: 01/12/1941, 78 y.o.   MRN: [id number]  Chief Complaint  Patient presents with  . Back Pain    Pt here for follow up on MRI     HPI Patient is in today for evaluation of facial contusion, back pain, falls, hypertension and more. She is accompanied by her husband. They note her numbness in her feet is worsening.s he notes pain and stiffness in her low back and hips. She notes after prolonged sitting when she gets up she feels stiff and her hips catch. She has fallen recently and she struck her face above the right eye. No visual changes, headache, syncope, nausea or vomiting. She denies any recent febrile illness or hospitalizations. Denies CP/palp/SOB/HA/congestion/fevers/GI or GU c/o. Taking meds as prescribed  Past Medical History:  Diagnosis Date  . ALKALINE PHOSPHATASE, ELEVATED 03/15/2009  . Allergic state 06/10/2012  . Anemia   . Anxiety and depression 04/28/2011  . Arthritis   . Atypical chest pain 11/30/2011  . 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 Baker's cyst of knee 05/22/2011  . Cancer ([place]) 01,  08   XRT/chemo 01-02/ lobular invasive ca  . Carotid artery disease ([place])    a. Carotid duplex 03/2014: stable 1-39% BICA, f/u due 03/2016.  [name] Chronic alcoholism in remission ([place]) 03/29/2011   Did not tolerate Klonopin, caused some confusion and bad dreams.    [name] COPD (chronic obstructive pulmonary disease) ([place])   . Dermatitis 11/23/2012  . EE (eosinophilic esophagitis)   . Esophageal ring   . ESOPHAGEAL STRICTURE 03/29/2009  . Fall 11/23/2012  . Family history of breast cancer   . Family history of colon cancer   . Family history of ovarian cancer   . Family history of pancreatic cancer   . Folliculitis of nose 02/18/1447  . GERD (gastroesophageal reflux disease) 09/29/2009   improved s/p cholecystectomy and esophagus dilatation  . History of chicken pox   . History of measles   . History of  shingles    2 episodes  . Hx of echocardiogram    a. Echo 01/2013: mild LVH, EF 55-60%, normal wall motion, Gr 1 diast dysfn  . Hyperlipidemia   . Knee pain, bilateral 07/23/2011  . Medicare annual wellness visit, subsequent 06/19/2015  . Mixed hyperlipidemia 10/17/2010   Qualifier: Diagnosis of  By: Mack Guise    . Orthostasis   . Osteopenia 03/14/2011  . Personal history of chemotherapy 2001  . Personal history of radiation therapy 2001   rt breast  . 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 PVC's (premature ventricular contractions)    a. Event monitor 01/2013: NSR, extensive PVCs.  . Radial neck fracture 10/2011   minimally displaced  . Urinary incontinence 03/19/2012  . Vaginitis 05/22/2011    Past Surgical History:  Procedure Laterality Date  . AUGMENTATION MAMMAPLASTY Right 05/27/2007  . BREAST BIOPSY Right 01/02/2007   wire loc  . BREAST BIOPSY  12/26/2006  . BREAST LUMPECTOMY Right 2001  . BREAST RECONSTRUCTION  2008, 2009, 2010  . BREAST REDUCTION WITH MASTOPEXY Left 05/30/2017   Procedure: LEFT BREAST REDUCTION FOR SYMTERY WITH MASTOPEXY;  Surgeon: Wallace Going, DO;  Location: [facility];  Service: Plastics;  Laterality: Left;  . CHOLECYSTECTOMY  2010  . COLONOSCOPY  09/05/10   cecal avm's  . DENTAL SURGERY  05/2016   4 dental implants by Dr. Loyal Gambler.  [name]  ERCP  2010    CBD stone extraction   . ESOPHAGOGASTRODUODENOSCOPY  01/08/2012   Procedure: ESOPHAGOGASTRODUODENOSCOPY (EGD);  Surgeon: Gatha Mayer, MD;  Location: [facility];  Service: Endoscopy;  Laterality: N/A;  . ESOPHAGOGASTRODUODENOSCOPY (EGD) WITH ESOPHAGEAL DILATION  2010, 2012  . LAPAROSCOPIC APPENDECTOMY N/A 08/04/2016   Procedure: APPENDECTOMY LAPAROSCOPIC;  Surgeon: Michael Boston, MD;  Location: [facility];  Service: General;  Laterality: N/A;  . LIPOSUCTION WITH LIPOFILLING Left 11/21/2017   Procedure: LIPOSUCTION FROM ABDOMEN WITH LIPOFILLING TO LEFT BREAST;  Surgeon: Wallace Going, DO;  Location: [facility];  Service: Plastics;  Laterality: Left;  [name] MASTECTOMY MODIFIED RADICAL Right 05/27/2007   , Mastectomy modified radical (08), breast reconstruction, CA lesions excised lateral abd wall 2010  . MASTOPEXY Left 11/21/2017   Procedure: LEFT BREAST REVISION MASTOPEXY FOR SYMMETRY;  Surgeon: Wallace Going, DO;  Location: [facility];  Service: Plastics;  Laterality: Left;  . REDUCTION MAMMAPLASTY Left   . SAVORY DILATION  01/08/2012   Procedure: SAVORY DILATION;  Surgeon: Gatha Mayer, MD;  Location: [facility];  Service: Endoscopy;  Laterality: N/A;  need xray    Family History  Problem Relation Age of Onset  . Heart disease Father   . Lung cancer Father        smoker  . Cirrhosis Sister        Primary Biliary  . Stroke Maternal Grandmother   . Alcohol abuse Maternal Grandfather   . Heart disease Paternal Grandfather   . Anxiety disorder Sister   . Osteoporosis Sister   . Arthritis Sister        Rheumatoid  . Osteoporosis Sister   . Skin cancer Sister        multiple skin cancers, over 25 excisions.  . Other Mother        tic [place]  . Breast cancer Maternal Aunt        dx in her 56s  . Breast cancer Paternal Aunt        dx in her 74s  . Pancreatic cancer Maternal Uncle        dx in his 66s; smoker  . Breast cancer Maternal Aunt        dx in her 43s  . Breast cancer Maternal Aunt        possible breast cancer dx and died in her 38s  . Ovarian cancer Maternal Aunt 29  . Pancreatic cancer Maternal Aunt        dx in her 66s  . Stomach cancer Maternal Uncle   . Colon cancer Maternal Uncle   . Lung cancer Paternal Aunt   . Breast cancer Cousin        paternal first cousin  . Breast cancer Cousin        maternal first cousin  . Anesthesia problems Neg Hx   . Hypotension Neg Hx   . Malignant hyperthermia Neg Hx   . Pseudochol deficiency Neg Hx     Social History   Socioeconomic History  .  Marital status: Married    Spouse name: Sam  . Number of children: 3  . Years of education: Not on file  . Highest education level: Not on file  Occupational History  . Occupation: Social worker  Social Needs  . Financial resource strain: Not on file  . Food insecurity    Worry: Not on file    Inability: Not on file  . Transportation needs  Medical: Not on file    Non-medical: Not on file  Tobacco Use  . Smoking status: Former Smoker    Packs/day: 1.00    Years: 50.00    Pack years: 50.00    Types: Cigarettes    Quit date: 05/22/2007    Years since quitting: 12.2  . Smokeless tobacco: Never Used  Substance and Sexual Activity  . Alcohol use: Not Currently  . Drug use: No  . Sexual activity: Yes    Partners: Male    Comment: lives with husband,   Lifestyle  . Physical activity    Days per week: Not on file    Minutes per session: Not on file  . Stress: Not on file  Relationships  . Social Herbalist on phone: Not on file    Gets together: Not on file    Attends religious service: Not on file    Active member of club or organization: Not on file    Attends meetings of clubs or organizations: Not on file    Relationship status: Not on file  . Intimate partner violence    Fear of current or ex partner: Not on file    Emotionally abused: Not on file    Physically abused: Not on file    Forced sexual activity: Not on file  Other Topics Concern  . Not on file  Social History Narrative  . Not on file    Outpatient Medications Prior to Visit  Medication Sig Dispense Refill  . acetaminophen (TYLENOL) 500 MG tablet Take 500-1,000 mg by mouth every 6 (six) hours as needed (for pain.).    [name] amitriptyline (ELAVIL) 25 MG tablet TAKE 6 TABLETS(150 MG) BY MOUTH AT BEDTIME 180 tablet 1  . atorvastatin (LIPITOR) 20 MG tablet TAKE 1 TABLET(20 MG) BY MOUTH TWICE DAILY 180 tablet 3  . Biotin (BIOTIN 5000) 5 MG CAPS Take by mouth.    . Cholecalciferol (VITAMIN D3) 1000  UNITS CAPS Take 2,000 Units by mouth daily.     . Cyanocobalamin (VITAMIN B 12 PO) Take 1 tablet by mouth daily.    [name] diltiazem (CARDIZEM) 30 MG tablet TAKE 1 TABLET BY MOUTH EVERY DAY FOR PALPITATION 90 tablet 0  . Ferrous Fumarate-Folic Acid 725-3 MG TABS Take 1 tablet by mouth daily. 30 each 3  . loratadine (CLARITIN) 10 MG tablet Take 1 tablet (10 mg total) by mouth daily. 30 tablet 11  . LORazepam (ATIVAN) 1 MG tablet TAKE 1 TABLET(1 MG) BY MOUTH EVERY 8 HOURS AS NEEDED FOR ANXIETY 70 tablet 1  . pantoprazole (PROTONIX) 40 MG tablet TAKE 1 TABLET(40 MG) BY MOUTH DAILY 90 tablet 1  . permethrin (ELIMITE) 5 % cream Apply from scalp to toes, avoid face. Leave on 8-14 hours. Thoroughly rinse with soap and water. Repeat in 2 weeks if needed. 60 g 1  . triamcinolone cream (KENALOG) 0.1 % Apply topically 2 (two) times daily. Apply to affected area 45 g 1   No facility-administered medications prior to visit.     Allergies  Allergen Reactions  . Codeine Other (See Comments)    "flu like symptoms"  . Morphine Other (See Comments)    "flu like symptoms"  . Peanut-Containing Drug Products Hives  . Sorbitol Other (See Comments)    GI Issues  . Tomato Diarrhea  . Zofran Other (See Comments)    headache  . Advil [Ibuprofen] Other (See Comments)    Irritates throat.  . Diphenhydramine Hcl  Other (See Comments)    "nervous and upset"  . Oxycodone Anxiety    Confusion with inability to think clearly    Review of Systems  Constitutional: Positive for malaise/fatigue. Negative for fever.  HENT: Negative for congestion.   Eyes: Negative for blurred vision.  Respiratory: Negative for shortness of breath.   Cardiovascular: Negative for chest pain, palpitations and leg swelling.  Gastrointestinal: Negative for abdominal pain, blood in stool and nausea.  Genitourinary: Negative for dysuria and frequency.  Musculoskeletal: Positive for back pain, falls and joint pain.  Skin: Negative for rash.   Neurological: Positive for sensory change. Negative for dizziness, loss of consciousness and headaches.  Endo/Heme/Allergies: Negative for environmental allergies.  Psychiatric/Behavioral: Negative for depression. The patient is not nervous/anxious.        Objective:    Physical Exam Vitals signs and nursing note reviewed.  Constitutional:      General: She is not in acute distress.    Appearance: She is well-developed.  HENT:     Head: Normocephalic and atraumatic.     Nose: Nose normal.  Eyes:     General:        Right eye: No discharge.        Left eye: No discharge.  Neck:     Musculoskeletal: Normal range of motion and neck supple.  Cardiovascular:     Rate and Rhythm: Normal rate and regular rhythm.     Heart sounds: No murmur.  Pulmonary:     Effort: Pulmonary effort is normal.     Breath sounds: Normal breath sounds.  Abdominal:     General: Bowel sounds are normal.     Palpations: Abdomen is soft.     Tenderness: There is no abdominal tenderness.  Skin:    General: Skin is warm and dry.  Neurological:     Mental Status: She is alert and oriented to person, place, and time.     BP 117/71   Pulse 93   Temp (!) 97.4 F (36.3 C) (Temporal)   Resp 16   Ht 5' 5.5" (1.664 m)   Wt 132 lb 12.8 oz (60.2 kg)   SpO2 98%   BMI 21.76 kg/m  Wt Readings from Last 3 Encounters:  08/06/19 132 lb 12.8 oz (60.2 kg)  06/19/19 134 lb (60.8 kg)  06/10/19 135 lb 3.2 oz (61.3 kg)    Diabetic Foot Exam - Simple   No data filed     Lab Results  Component Value Date   WBC 6.7 06/10/2019   HGB 12.3 06/10/2019   HCT 38.0 06/10/2019   PLT 255 06/10/2019   GLUCOSE 88 08/06/2019   CHOL 161 08/06/2019   TRIG 147.0 08/06/2019   HDL 58.40 08/06/2019   LDLDIRECT 117.4 01/13/2013   LDLCALC 73 08/06/2019   ALT 15 08/06/2019   AST 19 08/06/2019   NA 140 08/06/2019   K 4.5 08/06/2019   CL 100 08/06/2019   CREATININE 0.63 08/06/2019   BUN 19 08/06/2019   CO2 31  08/06/2019   TSH 2.96 08/06/2019   INR 1.02 08/12/2017   HGBA1C 5.8 08/06/2019    Lab Results  Component Value Date   TSH 2.96 08/06/2019   Lab Results  Component Value Date   WBC 6.7 06/10/2019   HGB 12.3 06/10/2019   HCT 38.0 06/10/2019   MCV 93.6 06/10/2019   PLT 255 06/10/2019   Lab Results  Component Value Date   NA 140 08/06/2019   K 4.5  08/06/2019   CHLORIDE 104 09/04/2017   CO2 31 08/06/2019   GLUCOSE 88 08/06/2019   BUN 19 08/06/2019   CREATININE 0.63 08/06/2019   BILITOT 0.3 08/06/2019   ALKPHOS 96 08/06/2019   AST 19 08/06/2019   ALT 15 08/06/2019   PROT 7.0 08/06/2019   ALBUMIN 4.3 08/06/2019   CALCIUM 9.9 08/06/2019   ANIONGAP 8 06/10/2019   EGFR >60 09/04/2017   GFR 91.65 08/06/2019   Lab Results  Component Value Date   CHOL 161 08/06/2019   Lab Results  Component Value Date   HDL 58.40 08/06/2019   Lab Results  Component Value Date   LDLCALC 73 08/06/2019   Lab Results  Component Value Date   TRIG 147.0 08/06/2019   Lab Results  Component Value Date   CHOLHDL 3 08/06/2019   Lab Results  Component Value Date   HGBA1C 5.8 08/06/2019       Assessment & Plan:   Problem List Items Addressed This Visit    Mixed hyperlipidemia    Tolerating statin, encouraged heart healthy diet, avoid trans fats, minimize simple carbs and saturated fats. Increase exercise as tolerated      Relevant Orders   Lipid panel (Completed)   Sedimentation rate (Completed)   GERD   Chronic alcoholism in remission (H[facility])   Elevated sed rate    Persistent but no notable change      Anemia   Falls    She is noting less stability on her feet with some peripheral neuropathy and low back pain. She is referred to physical therapy for further treatment      Low back pain - Primary   Relevant Orders   Comprehensive metabolic panel (Completed)   Ambulatory referral to Physical Therapy   Palpitations   Relevant Orders   TSH (Completed)   Hyperglycemia     hgba1c acceptable, minimize simple carbs. Increase exercise as tolerated. Continue current meds      Relevant Orders   Hemoglobin A1c (Completed)   Facial trauma, sequela    Has tenderness over her outer right eyebrow since she fell recently. No visual changes or headache. Xray of facial bones shows no fracture she will report any new symptoms.        Other Visit Diagnoses    Facial pain       Relevant Orders   DG Facial Bones Complete (Completed)      I am having Anne [name] "Torie" maintain her Vitamin D3, Cyanocobalamin (VITAMIN B 12 PO), acetaminophen, loratadine, Ferrous Fumarate-Folic Acid, Biotin, atorvastatin, triamcinolone cream, permethrin, pantoprazole, amitriptyline, LORazepam, and diltiazem.  No orders of the defined types were placed in this encounter.    Penni Homans, MD

## 2019-08-09 NOTE — Assessment & Plan Note (Signed)
She is noting less stability on her feet with some peripheral neuropathy and low back pain. She is referred to physical therapy for further treatment

## 2019-08-14 ENCOUNTER — Telehealth: Payer: Self-pay | Admitting: Internal Medicine

## 2019-08-14 NOTE — Telephone Encounter (Signed)
See mychart message that was sent.

## 2019-08-14 NOTE — Telephone Encounter (Signed)
Pt called stating that she has been experiencing a lot of indigestion, she would like to be seen asap. I offered first available with APP on 10/12 but she is looking for a sooner appt.

## 2019-08-18 ENCOUNTER — Telehealth: Payer: Self-pay

## 2019-08-18 NOTE — Telephone Encounter (Signed)
Left message for patient to call back  

## 2019-08-18 NOTE — Telephone Encounter (Signed)
Called the patient to offer the date of 08/20/19 for her EGD. No answer. Left a voicemail. Asked that she call to discuss.   (She will have to be instructed by staff other than Pre-Visit nurses. No openings on that schedule.)

## 2019-08-18 NOTE — Telephone Encounter (Signed)
-----   Message from Gatha Mayer, MD sent at 08/15/2019 11:35 AM EDT ----- Regarding: needs direct EGD She messaged about worsening GERD And EoE sxs  Please arrange  direct EGD  Thanks

## 2019-08-19 ENCOUNTER — Telehealth: Payer: Self-pay

## 2019-08-19 NOTE — Telephone Encounter (Signed)
Covid-19 screening questions   Do you now or have you had a fever in the last 14 days?  Do you have any respiratory symptoms of shortness of breath or cough now or in the last 14 days?  Do you have any family members or close contacts with diagnosed or suspected Covid-19 in the past 14 days?  Have you been tested for Covid-19 and found to be positive?       

## 2019-08-20 ENCOUNTER — Encounter: Payer: PPO | Admitting: Internal Medicine

## 2019-08-20 NOTE — Telephone Encounter (Signed)
Left message for patient to call back  

## 2019-08-21 NOTE — Telephone Encounter (Signed)
Patient has EGD and pre-visit scheduled.  See appt desk for details

## 2019-08-25 ENCOUNTER — Ambulatory Visit (AMBULATORY_SURGERY_CENTER): Payer: PPO

## 2019-08-25 ENCOUNTER — Encounter: Payer: Self-pay | Admitting: Internal Medicine

## 2019-08-25 ENCOUNTER — Other Ambulatory Visit: Payer: Self-pay

## 2019-08-25 VITALS — Temp 96.6°F | Ht 65.5 in | Wt 132.6 lb

## 2019-08-25 DIAGNOSIS — R1310 Dysphagia, unspecified: Secondary | ICD-10-CM

## 2019-08-25 NOTE — Progress Notes (Signed)
Denies allergies to eggs or soy products. Denies complication of anesthesia or sedation. Denies use of weight loss medication. Denies use of O2.   Emmi instructions given for endoscopy.  

## 2019-08-28 ENCOUNTER — Telehealth: Payer: Self-pay | Admitting: Internal Medicine

## 2019-08-31 ENCOUNTER — Encounter: Payer: Self-pay | Admitting: Internal Medicine

## 2019-08-31 ENCOUNTER — Ambulatory Visit (AMBULATORY_SURGERY_CENTER): Payer: PPO | Admitting: Internal Medicine

## 2019-08-31 ENCOUNTER — Other Ambulatory Visit: Payer: Self-pay

## 2019-08-31 VITALS — BP 128/67 | HR 78 | Temp 98.7°F | Resp 17 | Ht 65.0 in | Wt 132.0 lb

## 2019-08-31 DIAGNOSIS — K200 Eosinophilic esophagitis: Secondary | ICD-10-CM | POA: Diagnosis not present

## 2019-08-31 DIAGNOSIS — R1310 Dysphagia, unspecified: Secondary | ICD-10-CM | POA: Diagnosis not present

## 2019-08-31 DIAGNOSIS — K222 Esophageal obstruction: Secondary | ICD-10-CM

## 2019-08-31 DIAGNOSIS — K2950 Unspecified chronic gastritis without bleeding: Secondary | ICD-10-CM

## 2019-08-31 DIAGNOSIS — K209 Esophagitis, unspecified without bleeding: Secondary | ICD-10-CM | POA: Diagnosis not present

## 2019-08-31 DIAGNOSIS — R140 Abdominal distension (gaseous): Secondary | ICD-10-CM | POA: Diagnosis not present

## 2019-08-31 MED ORDER — SODIUM CHLORIDE 0.9 % IV SOLN: 500.0000 mL | Freq: Once | INTRAVENOUS | Status: DC

## 2019-08-31 NOTE — Progress Notes (Signed)
Temp LS V/S CW I have reviewed the patient's medical history in detail and updated the computerized patient record.

## 2019-08-31 NOTE — Patient Instructions (Addendum)
I dilated the esophagus again and took some biopsies to recheck things.  I think this continues to be from the eosinophilic esophagitis.  Plan to call with biopsy results and reassess. You may require repeat dilation and/or changes in therapy.  Diet of clear liquids only today and then soft foods tomorrow, then on Wednesday (10/21) go solid foods but cut small and chew well.  I appreciate the opportunity to care for you. Gatha Mayer, MD, [facility]  Information on esophagitis given to you today.  Await pathology results.  YOU HAD AN ENDOSCOPIC PROCEDURE TODAY AT [facility]:   Refer to the procedure report that was given to you for any specific questions about what was found during the examination.  If the procedure report does not answer your questions, please call your gastroenterologist to clarify.  If you requested that your care partner not be given the details of your procedure findings, then the procedure report has been included in a sealed envelope for you to review at your convenience later.  YOU SHOULD EXPECT: Some feelings of bloating in the abdomen. Passage of more gas than usual.  Walking can help get rid of the air that was put into your GI tract during the procedure and reduce the bloating. If you had a lower endoscopy (such as a colonoscopy or flexible sigmoidoscopy) you may notice spotting of blood in your stool or on the toilet paper. If you underwent a bowel prep for your procedure, you may not have a normal bowel movement for a few days.  Please Note:  You might notice some irritation and congestion in your nose or some drainage.  This is from the oxygen used during your procedure.  There is no need for concern and it should clear up in a day or so.  SYMPTOMS TO REPORT IMMEDIATELY:    Following upper endoscopy (EGD)  Vomiting of blood or coffee ground material  New chest pain or pain under the shoulder blades  Painful or persistently difficult  swallowing  New shortness of breath  Fever of 100F or higher  Black, tarry-looking stools  For urgent or emergent issues, a gastroenterologist can be reached at any hour by calling [phone].   DIET:  We do recommend a small meal at first, but then you may proceed to your regular diet.  Drink plenty of fluids but you should avoid alcoholic beverages for 24 hours.  ACTIVITY:  You should plan to take it easy for the rest of today and you should NOT DRIVE or use heavy machinery until tomorrow (because of the sedation medicines used during the test).    FOLLOW UP: Our staff will call the number listed on your records 48-72 hours following your procedure to check on you and address any questions or concerns that you may have regarding the information given to you following your procedure. If we do not reach you, we will leave a message.  We will attempt to reach you two times.  During this call, we will ask if you have developed any symptoms of COVID 19. If you develop any symptoms (ie: fever, flu-like symptoms, shortness of breath, cough etc.) before then, please call [phone].  If you test positive for Covid 19 in the 2 weeks post procedure, please call and report this information to [place].    If any biopsies were taken you will be contacted by phone or by letter within the next 1-3 weeks.  Please call us at [phone]  if you have not heard about the biopsies in 3 weeks.    SIGNATURES/CONFIDENTIALITY: You and/or your care partner have signed paperwork which will be entered into your electronic medical record.  These signatures attest to the fact that that the information above on your After Visit Summary has been reviewed and is understood.  Full responsibility of the confidentiality of this discharge information lies with you and/or your care-partner.

## 2019-08-31 NOTE — Progress Notes (Signed)
To PACU, VSS. Report to [email or web address] 

## 2019-08-31 NOTE — Op Note (Signed)
[facility] Patient Name: Anne [name] Procedure Date: 08/31/2019 2:16 PM MRN: [id number] Endoscopist: Gatha Mayer , MD Age: 78 Referring MD:  Date of Birth: 1941-04-20 Gender: Female Account #: [id number] Procedure:                Upper GI endoscopy Indications:              Dysphagia, Stricture of the esophagus, For therapy                            of esophageal stricture, Eosinophilic esophagitis,                            For therapy of eosinophilic esophagitis Medicines:                Propofol per Anesthesia, Monitored Anesthesia Care Procedure:                Pre-Anesthesia Assessment:                           - Prior to the procedure, a History and Physical                            was performed, and patient medications and                            allergies were reviewed. The patient's tolerance of                            previous anesthesia was also reviewed. The risks                            and benefits of the procedure and the sedation                            options and risks were discussed with the patient.                            All questions were answered, and informed consent                            was obtained. Prior Anticoagulants: The patient has                            taken no previous anticoagulant or antiplatelet                            agents. ASA Grade Assessment: III - A patient with                            severe systemic disease. After reviewing the risks                            and benefits, the patient was deemed in  satisfactory condition to undergo the procedure.                           After obtaining informed consent, the endoscope was                            passed under direct vision. Throughout the                            procedure, the patient's blood pressure, pulse, and                            oxygen saturations were monitored continuously. The                   Endoscope was introduced through the mouth, and                            advanced to the antrum of the stomach. The upper GI                            endoscopy was accomplished without difficulty. The                            patient tolerated the procedure well. 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 The scope                            was withdrawn. Dilation was performed with a                            Maloney dilator with mild resistance at 40 Fr. The                            dilation site was examined following endoscope                            reinsertion and showed moderate mucosal disruption                            and moderate improvement in luminal narrowing.                            Estimated blood loss was minimal.                           A severe Schatzki ring was found at the                            gastroesophageal junction. A TTS dilator was passed                            through the scope. Dilation with a 13.5-14.5-15.5  mm balloon dilator was performed to 15.5 mm. The                            dilation site was examined and showed moderate                            mucosal disruption. Scope then passed. The balloon                            was withdrawn retrograde without impaction otherwise                           Mucosal changes including ringed esophagus,                            circumferential folds and longitudinal markings                            were found in the upper third of the esophagus and                            in the middle third of the esophagus. Biopsies were                            obtained from the proximal and distal esophagus                            with cold forceps for histology of suspected                            eosinophilic esophagitis. Estimated blood loss was                            minimal.                            The exam was otherwise without abnormality.                           The cardia and gastric fundus were normal on                            retroflexion. Complications:            No immediate complications. Estimated Blood Loss:     Estimated blood loss was minimal. 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 Dilated. 32 Fr                            maloney opened this up and allowed scope to pass                           - Severe Schatzki ring. Dilated. 15.5 m allowed  scope to pass                           - Esophageal mucosal changes secondary to                            eosinophilic esophagitis. Biopsied.                           - The examination was otherwise normal. Exam to                            antrum Recommendation:           - Patient has a contact number available for                            emergencies. The signs and symptoms of potential                            delayed complications were discussed with the                            patient. Return to normal activities tomorrow.                            Written discharge instructions were provided to the                            patient.                           - Cler liquids today, soft diet tomorrow and                            regular diet next day.                           - Continue present medications.                           - Await pathology results.                           - I willcall with results and plans Gatha Mayer, MD 08/31/2019 2:43:48 PM This report has been signed electronically.

## 2019-08-31 NOTE — Progress Notes (Signed)
Called to room to assist during endoscopic procedure.  Patient ID and intended procedure confirmed with present staff. Received instructions for my participation in the procedure from the performing physician.  

## 2019-09-02 ENCOUNTER — Telehealth: Payer: Self-pay

## 2019-09-02 NOTE — Telephone Encounter (Signed)
Second post procedure follow up call, no answer 

## 2019-09-02 NOTE — Telephone Encounter (Signed)
Follow up call attempted.  NALM  

## 2019-09-03 ENCOUNTER — Encounter: Payer: Self-pay | Admitting: Family Medicine

## 2019-09-09 NOTE — Progress Notes (Signed)
[facility]   [phone] [phone]   Clinic Follow up Note   Patient Care Team: [name], MD as PCP - General (Family Medicine) Paralee Cancel, MD as Consulting Physician (Orthopedic Surgery) Fay Records, MD as Consulting Physician (Cardiology) Gatha Mayer, MD as Consulting Physician (Gastroenterology) Clent Jacks, MD as Consulting Physician (Ophthalmology) Roney Jaffe, DDS (Oral Surgery) Everlene Other (Dentistry) Haverstock, Jennefer Bravo, MD as Referring Physician (Dermatology) Dillingham, Loel Lofty, DO as Attending Physician (Plastic Surgery) Truitt Merle, MD as Consulting Physician (Hematology)  Date of Service:  09/14/2019  CHIEF COMPLAINT: F/u of recurrent right breast cancer  SUMMARY OF ONCOLOGIC HISTORY: Oncology History  Breast cancer, right breast ([place])  01/2000 Initial Diagnosis   Breast cancer, right breast ([place])  History of primary lobular right breast carcinoma. 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 I believe that she had local radiation, tho that information is not available in this EMR.   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 2008 (tho patient did not agree to surgery until July 2008), invasive lobular carcinoma ER 81%, PR 96%, Her 2 1+ (PM08-99) , 1.5 cm invasive lobular with 3 axillary nodes negative, LVSI and perineural invasion present [id number]). She subsequently declined adjuvant systemic treatment . She had a complicated course following right mastectomy, with difficult healing after right breast reconstruction.     07/2009 Relapse/Recurrence   She had skin recurrence lateral right chest wall excised in Sept 2010 then treated with xeloda from Oct 2010 thru Feb 2011 and began on Arimidex July 2011; she may have stopped Arimidex after bone density scan 02-2014, or possibly the year prior (?).    2015 Imaging   Bone density scan [facility] 02-15-14 still osteopenic range, slightly lower in LS compared with 2013 and stable in hips.      CURRENT THERAPY:  Surveillance  INTERVAL HISTORY:  Anne [name] is here for a follow up of recurrent right breast cancer. She presents to the clinic with her husband. She notes she stays home and does not eat out. She notes she is doing well. She denies any complaints or any major new changes. She had endoscopy on 08/31/19. She denies any GI bleeding recently. She notes she had her esophagus stretched and she has been able to eat better. She notes her lumbar MRI showed spinal bulge and was referred to PT. She notes she saw her PCP recently and will f/u with them every 3 months.  She notes the soles of her feet feel numb. Her PCP referred her to a neurologist. This is not new to her but the tingling has became constant.     REVIEW OF SYSTEMS:   Constitutional: Denies fevers, chills or abnormal weight loss Eyes: Denies blurriness of vision Ears, nose, mouth, throat, and face: Denies mucositis or sore throat Respiratory: Denies cough, dyspnea or wheezes Cardiovascular: Denies palpitation, chest discomfort or lower extremity swelling Gastrointestinal:  Denies nausea, heartburn or change in bowel habits Skin: Denies abnormal skin rashes MSK: (+) Lower back pain  Lymphatics: Denies new lymphadenopathy or easy bruising Neurological: (+) Numbness and tingling of soles of  feet Behavioral/Psych: Mood is stable, no new changes  All other systems were reviewed with the patient and are negative.  MEDICAL HISTORY:  Past Medical History:  Diagnosis Date  . ALKALINE PHOSPHATASE,  ELEVATED 03/15/2009  . Allergic state 06/10/2012  . Allergy   . Anemia   . Anxiety and depression 04/28/2011  . Arthritis   . Asthma   . Atypical chest pain 11/30/2011  . 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 Baker's cyst of knee 05/22/2011  . Cancer ([place]) 01,  08   XRT/chemo 01-02/ lobular invasive ca  . Carotid artery disease ([place])    a. Carotid duplex 03/2014: stable 1-39% BICA, f/u due 03/2016.  [name] Chronic alcoholism in remission ([place]) 03/29/2011   Did not tolerate Klonopin, caused some confusion and bad dreams.    . Clotting disorder ([place])   . COPD (chronic obstructive pulmonary disease) ([place])   . Dermatitis 11/23/2012  . EE (eosinophilic esophagitis)   . Emphysema of lung ([place])   . Esophageal ring   . ESOPHAGEAL STRICTURE 03/29/2009  . Fall 11/23/2012  . Family history of breast cancer   . Family history of colon cancer   . Family history of ovarian cancer   . Family history of pancreatic cancer   . Folliculitis of nose [id number]  . GERD (gastroesophageal reflux disease) 09/29/2009   improved s/p cholecystectomy and esophagus dilatation  . History of chicken pox   . History of measles   . History of shingles    2 episodes  . Hx of echocardiogram    a. Echo 01/2013: mild LVH, EF 55-60%, normal wall motion, Gr 1 diast dysfn  . Hyperlipidemia   . Knee pain, bilateral 07/23/2011  . Medicare annual wellness visit, subsequent 06/19/2015  . Mixed hyperlipidemia 10/17/2010   Qualifier: Diagnosis of  By: Mack Guise    . Orthostasis   . Osteopenia 03/14/2011  . Osteoporosis   . Personal history of chemotherapy 2001  . Personal history of radiation therapy 2001   rt breast  . 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 PVC's (premature ventricular contractions)    a. Event monitor 01/2013: NSR, extensive PVCs.  . Radial neck fracture 10/2011   minimally displaced  . Substance abuse ([place])    In remission 3 years.   . Urinary incontinence 03/19/2012  . Vaginitis  05/22/2011    SURGICAL HISTORY: Past Surgical History:  Procedure Laterality Date  . APPENDECTOMY    . AUGMENTATION MAMMAPLASTY Right 05/27/2007  . BREAST BIOPSY Right 01/02/2007   wire loc  . BREAST BIOPSY  12/26/2006  . BREAST LUMPECTOMY Right 2001  . BREAST RECONSTRUCTION  2008, 2009, 2010  . BREAST REDUCTION WITH MASTOPEXY Left 05/30/2017   Procedure: LEFT BREAST REDUCTION FOR SYMTERY WITH MASTOPEXY;  Surgeon: Wallace Going, DO;  Location: [facility];  Service: Plastics;  Laterality: Left;  . CHOLECYSTECTOMY  2010  . COLONOSCOPY  09/05/10   cecal avm's  . DENTAL SURGERY  05/2016   4 dental implants by Dr. Loyal Gambler.  [name] ERCP  2010    CBD stone extraction   . ESOPHAGOGASTRODUODENOSCOPY  01/08/2012   Procedure: ESOPHAGOGASTRODUODENOSCOPY (EGD);  Surgeon: Gatha Mayer, MD;  Location: [facility];  Service: Endoscopy;  Laterality: N/A;  . ESOPHAGOGASTRODUODENOSCOPY (EGD) WITH ESOPHAGEAL DILATION  2010, 2012  . LAPAROSCOPIC APPENDECTOMY N/A 08/04/2016   Procedure: APPENDECTOMY LAPAROSCOPIC;  Surgeon: Michael Boston, MD;  Location: [facility];  Service: General;  Laterality: N/A;  .  LIPOSUCTION WITH LIPOFILLING Left 11/21/2017   Procedure: LIPOSUCTION FROM ABDOMEN WITH LIPOFILLING TO LEFT BREAST;  Surgeon: Wallace Going, DO;  Location: [facility];  Service: Plastics;  Laterality: Left;  [name] MASTECTOMY MODIFIED RADICAL Right 05/27/2007   , Mastectomy modified radical (08), breast reconstruction, CA lesions excised lateral abd wall 2010  . MASTOPEXY Left 11/21/2017   Procedure: LEFT BREAST REVISION MASTOPEXY FOR SYMMETRY;  Surgeon: Wallace Going, DO;  Location: [place] [facility];  Service: Plastics;  Laterality: Left;  . REDUCTION MAMMAPLASTY Left   . SAVORY DILATION  01/08/2012   Procedure: SAVORY DILATION;  Surgeon: Gatha Mayer, MD;  Location: [facility];  Service: Endoscopy;  Laterality: N/A;  need xray    I have reviewed the social  history and family history with the patient and they are unchanged from previous note.  ALLERGIES:  is allergic to codeine; morphine; peanut-containing drug products; sorbitol; tomato; zofran; advil [ibuprofen]; diphenhydramine hcl; and oxycodone.  MEDICATIONS:  Current Outpatient Medications  Medication Sig Dispense Refill  . acetaminophen (TYLENOL) 500 MG tablet Take 500-1,000 mg by mouth every 6 (six) hours as needed (for pain.).    [name] amitriptyline (ELAVIL) 25 MG tablet TAKE 6 TABLETS(150 MG) BY MOUTH AT BEDTIME 180 tablet 1  . atorvastatin (LIPITOR) 20 MG tablet TAKE 1 TABLET(20 MG) BY MOUTH TWICE DAILY 180 tablet 3  . Biotin (BIOTIN 5000) 5 MG CAPS Take by mouth.    . Cholecalciferol (VITAMIN D3) 1000 UNITS CAPS Take 2,000 Units by mouth daily.     . Cyanocobalamin (VITAMIN B 12 PO) Take 1 tablet by mouth daily.    [name] diltiazem (CARDIZEM) 30 MG tablet TAKE 1 TABLET BY MOUTH EVERY DAY FOR PALPITATION 90 tablet 0  . Ferrous Fumarate-Folic Acid 99991111 MG TABS Take 1 tablet by mouth daily. 30 each 3  . loratadine (CLARITIN) 10 MG tablet Take 1 tablet (10 mg total) by mouth daily. 30 tablet 11  . LORazepam (ATIVAN) 1 MG tablet TAKE 1 TABLET(1 MG) BY MOUTH EVERY 8 HOURS AS NEEDED FOR ANXIETY 70 tablet 1  . pantoprazole (PROTONIX) 40 MG tablet TAKE 1 TABLET(40 MG) BY MOUTH DAILY 90 tablet 1  . permethrin (ELIMITE) 5 % cream Apply from scalp to toes, avoid face. Leave on 8-14 hours. Thoroughly rinse with soap and water. Repeat in 2 weeks if needed. 60 g 1  . triamcinolone cream (KENALOG) 0.1 % Apply topically 2 (two) times daily. Apply to affected area 45 g 1   No current facility-administered medications for this visit.     PHYSICAL EXAMINATION: ECOG PERFORMANCE STATUS: 0 - Asymptomatic  Vitals:   09/14/19 1258  BP: 114/69  Pulse: (!) 101  Resp: 18  Temp: 98.5 F (36.9 C)  SpO2: 96%   Filed Weights   09/14/19 1258  Weight: 133 lb 4.8 oz (60.5 kg)    GENERAL:alert, no distress and  comfortable SKIN: skin color, texture, turgor are normal, no rashes or significant lesions EYES: normal, Conjunctiva are pink and non-injected, sclera clear  NECK: supple, thyroid normal size, non-tender, without nodularity LYMPH:  no palpable lymphadenopathy in the cervical, axillary  LUNGS: clear to auscultation and percussion with normal breathing effort HEART: regular rate & rhythm and no murmurs and no lower extremity edema ABDOMEN:abdomen soft, non-tender and normal bowel sounds Musculoskeletal:no cyanosis of digits and no clubbing  NEURO: alert & oriented x 3 with fluent speech, no focal motor/sensory deficits BREAST: S/p right mastectomy with implant in place:  Surgical incision healed well. No palpable mass, nodules or adenopathy bilaterally. Breast exam benign.   LABORATORY DATA:  I have reviewed the data as listed CBC Latest Ref Rng & Units 09/14/2019 06/10/2019 04/28/2019  WBC 4.0 - 10.5 K/uL 6.2 6.7 6.6  Hemoglobin 12.0 - 15.0 g/dL 12.7 12.3 12.7  Hematocrit 36.0 - 46.0 % 39.3 38.0 38.5  Platelets 150 - 400 K/uL 270 255 267.0     CMP Latest Ref Rng & Units 08/06/2019 06/10/2019 10/31/2018  Glucose 70 - 99 mg/dL 88 96 103(H)  BUN 6 - 23 mg/dL 19 21 14   Creatinine 0.40 - 1.20 mg/dL 0.63 0.77 0.72  Sodium 135 - 145 mEq/L 140 140 142  Potassium 3.5 - 5.1 mEq/L 4.5 3.9 3.8  Chloride 96 - 112 mEq/L 100 105 104  CO2 19 - 32 mEq/L 31 27 28   Calcium 8.4 - 10.5 mg/dL 9.9 9.7 9.2  Total Protein 6.0 - 8.3 g/dL 7.0 7.3 7.0  Total Bilirubin 0.2 - 1.2 mg/dL 0.3 0.4 0.3  Alkaline Phos 39 - 117 U/L 96 90 125  AST 0 - 37 U/L 19 17 17   ALT 0 - 35 U/L 15 17 18       RADIOGRAPHIC STUDIES: I have personally reviewed the radiological images as listed and agreed with the findings in the report. No results found.   ASSESSMENT & PLAN:  Anne [name] is a 78 y.o. female with   1. History of recurrent right breast lobular carcinoma, in 2001, 2008 and chest wall recurrence in 2010 -She was  initially diagnosed in 01/2000 but unfortunately hadrecurrencetwice, the last time involvingthe chest wall in 2010.  -At that time she was treated with Xeloda and Exemestane 2011-2015. She is now on surveillance.  -She has been anxious about another breast cancer or recurrence. She is interested in left mastectomy. I discussed given her age and comorbidities I do not recommend mastectomy and it is not medical necessary. I recommend adding breast MRI to screening. She is interested. Plan to start in 10/2019 and continue yearly.   -She is clinically doing well. She had endoscopy last month and her oesophagus stretched. She has been able to eat better. Her recent Lumbar MRI shows bulge. She was referred for PT. For her neuropathy of soles of feet, her PCP referred her to Neurologist.  -Lab reviewed, her CBC WNL, Iron panel still pending. Her physical exam and her 05/2019 mammogram were unremarkable. There is no clinical concern for recurrence. -Continue surveillance. Breast MRI in 10/2019. Next Mammogram in 05/2020 -F/u in 6 months. She wants to be seen by me twice a year.   2. Osteopenia, Lower back pain  -Her 06/2018 DEXA shows osteopenia with lowest T-score of -2.3 at AP spine which has progressed since last scan.Repeat in 06/2020.  -she will continue calcium and vitD -She has lumbar disc bulging on MRI which is likely related to her lower back pain. She has been referred to PT.    3.Anemiaof iron deficiency, secondary to AVM -shehas developed new anemiain 11/2019with lab evidence of iron deficiency. -Herprior colonoscopy in 2014showed AVM in her Cecum, which may contribute to her anemia. -repeat colonoscopy from 10/2018 showedevidenceof AVM which is the causeof her anemia. She also hadseveralpolyps found which were all benign.  -I discussed she may have recurrent bleeding with AVM which can cause recurrence anemia. Will monitor. -She responded well to oral iron, anemia has  resolved. Continueoral iron1-2 times a day. -She had significant epistaxis in early 05/2019.  Has resolved. She did notes fresh blood in her mucus this morning, but no true bleeding. Will monitor.  -Hg normal today, Iron panel is still pending (09/14/19) -She has restarted Aspirin. She has not had any bleeding lately.    4. Neuropathy of soles of feet  -She notes this has been ongoing for some time but has recently become constant and tingling increased. She denies burning pain  -I will refer her to Dr. Mickeal Skinner  -I discussed Gabapentin can help her tingling. She will consider it if tingling worsens. It's tolerable now  -She is a vegetarian but takes Oral B12. I will also check her B12 level today.     PLAN: -Add B12 level to labs  -Refer her to Dr Mickeal Skinner  -Lab and f/u in 6 months    No problem-specific Assessment & Plan notes found for this encounter.   No orders of the defined types were placed in this encounter.  All questions were answered. The patient knows to call the clinic with any problems, questions or concerns. No barriers to learning was detected. I spent 20 minutes counseling the patient face to face. The total time spent in the appointment was 25 minutes and more than 50% was on counseling and review of test results     Truitt Merle, MD 09/14/2019   I, Joslyn Devon, am acting as scribe for Truitt Merle, MD.   I have reviewed the above documentation for accuracy and completeness, and I agree with the above.

## 2019-09-14 ENCOUNTER — Inpatient Hospital Stay: Payer: PPO | Attending: Hematology

## 2019-09-14 ENCOUNTER — Encounter: Payer: Self-pay | Admitting: Hematology

## 2019-09-14 ENCOUNTER — Other Ambulatory Visit: Payer: Self-pay

## 2019-09-14 ENCOUNTER — Inpatient Hospital Stay (HOSPITAL_BASED_OUTPATIENT_CLINIC_OR_DEPARTMENT_OTHER): Payer: PPO | Admitting: Hematology

## 2019-09-14 ENCOUNTER — Telehealth: Payer: Self-pay | Admitting: Hematology

## 2019-09-14 VITALS — BP 114/69 | HR 101 | Temp 98.5°F | Resp 18 | Ht 65.0 in | Wt 133.3 lb

## 2019-09-14 DIAGNOSIS — Z853 Personal history of malignant neoplasm of breast: Secondary | ICD-10-CM | POA: Insufficient documentation

## 2019-09-14 DIAGNOSIS — Z7982 Long term (current) use of aspirin: Secondary | ICD-10-CM | POA: Diagnosis not present

## 2019-09-14 DIAGNOSIS — D509 Iron deficiency anemia, unspecified: Secondary | ICD-10-CM | POA: Insufficient documentation

## 2019-09-14 DIAGNOSIS — F419 Anxiety disorder, unspecified: Secondary | ICD-10-CM | POA: Diagnosis not present

## 2019-09-14 DIAGNOSIS — M8589 Other specified disorders of bone density and structure, multiple sites: Secondary | ICD-10-CM | POA: Diagnosis not present

## 2019-09-14 DIAGNOSIS — J449 Chronic obstructive pulmonary disease, unspecified: Secondary | ICD-10-CM | POA: Diagnosis not present

## 2019-09-14 DIAGNOSIS — Z9011 Acquired absence of right breast and nipple: Secondary | ICD-10-CM | POA: Diagnosis not present

## 2019-09-14 DIAGNOSIS — G629 Polyneuropathy, unspecified: Secondary | ICD-10-CM | POA: Diagnosis not present

## 2019-09-14 DIAGNOSIS — D649 Anemia, unspecified: Secondary | ICD-10-CM | POA: Diagnosis not present

## 2019-09-14 DIAGNOSIS — F329 Major depressive disorder, single episode, unspecified: Secondary | ICD-10-CM | POA: Insufficient documentation

## 2019-09-14 DIAGNOSIS — Z79899 Other long term (current) drug therapy: Secondary | ICD-10-CM | POA: Diagnosis not present

## 2019-09-14 DIAGNOSIS — Z170 Estrogen receptor positive status [ER+]: Secondary | ICD-10-CM | POA: Diagnosis not present

## 2019-09-14 LAB — CBC WITH DIFFERENTIAL (CANCER CENTER ONLY)
Abs Immature Granulocytes: 0.01 10*3/uL (ref 0.00–0.07)
Basophils Absolute: 0.1 10*3/uL (ref 0.0–0.1)
Basophils Relative: 1 %
Eosinophils Absolute: 0.1 10*3/uL (ref 0.0–0.5)
Eosinophils Relative: 2 %
HCT: 39.3 % (ref 36.0–46.0)
Hemoglobin: 12.7 g/dL (ref 12.0–15.0)
Immature Granulocytes: 0 %
Lymphocytes Relative: 26 %
Lymphs Abs: 1.6 10*3/uL (ref 0.7–4.0)
MCH: 30.8 pg (ref 26.0–34.0)
MCHC: 32.3 g/dL (ref 30.0–36.0)
MCV: 95.4 fL (ref 80.0–100.0)
Monocytes Absolute: 0.5 10*3/uL (ref 0.1–1.0)
Monocytes Relative: 8 %
Neutro Abs: 4 10*3/uL (ref 1.7–7.7)
Neutrophils Relative %: 63 %
Platelet Count: 270 10*3/uL (ref 150–400)
RBC: 4.12 MIL/uL (ref 3.87–5.11)
RDW: 13.9 % (ref 11.5–15.5)
WBC Count: 6.2 10*3/uL (ref 4.0–10.5)
nRBC: 0 % (ref 0.0–0.2)

## 2019-09-14 LAB — IRON AND TIBC
Iron: 73 ug/dL (ref 41–142)
Saturation Ratios: 23 % (ref 21–57)
TIBC: 314 ug/dL (ref 236–444)
UIBC: 240 ug/dL (ref 120–384)

## 2019-09-14 LAB — FERRITIN: Ferritin: 41 ng/mL (ref 11–307)

## 2019-09-14 NOTE — Addendum Note (Signed)
Addended by: Truitt Merle on: 09/14/2019 05:47 PM   Modules accepted: Orders

## 2019-09-14 NOTE — Telephone Encounter (Signed)
Scheduled appt per 11/2 los.  Sent a staff message to get a calendar mailed out.

## 2019-09-15 ENCOUNTER — Other Ambulatory Visit: Payer: Self-pay

## 2019-09-15 DIAGNOSIS — D649 Anemia, unspecified: Secondary | ICD-10-CM

## 2019-09-15 LAB — VITAMIN B12: Vitamin B-12: 644 pg/mL (ref 180–914)

## 2019-09-15 NOTE — Progress Notes (Signed)
Anne [name],  The biopsies showed some inflammation, no cancer or anything bad. It did not show an increase in the eosinophils though it looks like the problem you have had before when we did see that.  Please updtae me on your symptoms of swallowing difficulty. All better? Some better? No better?  I will make further recommendations after that.  Best regards,  Gatha Mayer, MD, [facility]

## 2019-09-16 ENCOUNTER — Telehealth: Payer: Self-pay | Admitting: Internal Medicine

## 2019-09-16 ENCOUNTER — Encounter: Payer: Self-pay | Admitting: Hematology

## 2019-09-16 ENCOUNTER — Other Ambulatory Visit: Payer: Self-pay | Admitting: Family Medicine

## 2019-09-16 NOTE — Telephone Encounter (Signed)
Anne [name] cld back to schedule an appt to see Dr. Mickeal Skinner on 11/6 at 12pm.

## 2019-09-17 ENCOUNTER — Ambulatory Visit (INDEPENDENT_AMBULATORY_CARE_PROVIDER_SITE_OTHER): Payer: PPO

## 2019-09-17 ENCOUNTER — Other Ambulatory Visit: Payer: Self-pay

## 2019-09-17 DIAGNOSIS — Z23 Encounter for immunization: Secondary | ICD-10-CM

## 2019-09-18 ENCOUNTER — Other Ambulatory Visit: Payer: Self-pay

## 2019-09-18 ENCOUNTER — Telehealth: Payer: Self-pay | Admitting: Internal Medicine

## 2019-09-18 ENCOUNTER — Inpatient Hospital Stay (HOSPITAL_BASED_OUTPATIENT_CLINIC_OR_DEPARTMENT_OTHER): Payer: PPO | Admitting: Internal Medicine

## 2019-09-18 DIAGNOSIS — G620 Drug-induced polyneuropathy: Secondary | ICD-10-CM | POA: Insufficient documentation

## 2019-09-18 DIAGNOSIS — T451X5A Adverse effect of antineoplastic and immunosuppressive drugs, initial encounter: Secondary | ICD-10-CM | POA: Diagnosis not present

## 2019-09-18 DIAGNOSIS — Z853 Personal history of malignant neoplasm of breast: Secondary | ICD-10-CM | POA: Diagnosis not present

## 2019-09-18 MED ORDER — GABAPENTIN 300 MG PO CAPS: 300.0000 mg | ORAL_CAPSULE | Freq: Every day | ORAL | 3 refills | Status: DC

## 2019-09-18 NOTE — Telephone Encounter (Signed)
I talk with patient regarding schedule  

## 2019-09-18 NOTE — Progress Notes (Signed)
[facility] [place], [place] [address] [phone]   New Patient Evaluation  Date of Service: 09/18/19 Patient Name: Anne [name] Patient MRN: [id number] Patient DOB: 24-Sep-1941 Provider: Ventura Sellers, MD  Identifying Statement:  Anne [name] is a 78 y.o. 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    Referring Provider: [name], MD [address],  [place] [address]  Primary Cancer:  Oncologic History: Oncology History  Breast cancer, right breast ([place])  01/2000 Initial Diagnosis   Breast cancer, right breast ([place])  History of primary lobular right breast carcinoma. 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 I believe that she had local radiation, tho that information is not available in this EMR.   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 2008 (tho patient did not agree to surgery until July 2008), invasive lobular carcinoma ER 81%, PR 96%, Her 2 1+ (PM08-99) , 1.5 cm invasive lobular with 3 axillary nodes negative, LVSI and perineural invasion present [id number]). She subsequently declined adjuvant systemic treatment . She had a complicated course following right mastectomy, with difficult healing after right breast reconstruction.    07/2009 Relapse/Recurrence   She had skin recurrence lateral right chest wall excised in Sept 2010 then treated with xeloda from Oct 2010 thru Feb 2011 and began on Arimidex July 2011;  she may have stopped Arimidex after bone density scan 02-2014, or possibly the year prior (?).    2015 Imaging   Bone density scan [facility] 02-15-14 still osteopenic range, slightly lower in LS compared with 2013 and stable in hips.     History of Present Illness: The patient's records from the referring physician were obtained and reviewed and the patient interviewed to confirm this HPI.  [name] presents today to review neuropathic symptoms.  She describes a 1 year history of both numbness and tingling/pain affecting the bottoms of her feet.  This has not progressed over this time interval, either with respect to intensity or coverage.  She denies gait impairment aside from orthopedic limitations.  No recent falls, no seizures or headaches.  Last exposure to cytotoxic chemotherapy was 2011 (capecitabine), but she recently was diagnosed with and has been treated for iron deficiency anemia.  No history of diabetes or alcoholism.  Medications: Current Outpatient Medications on File Prior to Visit  Medication Sig Dispense Refill  . acetaminophen (TYLENOL) 500 MG tablet Take 500-1,000 mg by mouth every 6 (six) hours as needed (for pain.).    [name] amitriptyline (ELAVIL) 25 MG tablet TAKE 6 TABLETS(150 MG) BY MOUTH AT BEDTIME 180 tablet 1  . atorvastatin (LIPITOR) 20 MG tablet TAKE 1 TABLET(20 MG) BY MOUTH TWICE DAILY 180 tablet 3  . Biotin (BIOTIN 5000) 5 MG CAPS Take by mouth.    . Cholecalciferol (VITAMIN D3) 1000 UNITS CAPS Take 2,000 Units by mouth daily.     . Cyanocobalamin (VITAMIN B 12 PO) Take 1 tablet by mouth daily.    [name] diltiazem (CARDIZEM) 30 MG tablet TAKE 1  TABLET BY MOUTH EVERY DAY FOR PALPITATION 90 tablet 0  . Ferrous Fumarate-Folic Acid 99991111 MG TABS Take 1 tablet by mouth daily. 30 each 3  . loratadine (CLARITIN) 10 MG tablet Take 1 tablet (10 mg total) by mouth daily. 30 tablet 11  . LORazepam (ATIVAN) 1 MG tablet TAKE 1 TABLET(1 MG) BY MOUTH EVERY 8 HOURS AS NEEDED FOR ANXIETY  70 tablet 1  . pantoprazole (PROTONIX) 40 MG tablet TAKE 1 TABLET(40 MG) BY MOUTH DAILY 90 tablet 1  . permethrin (ELIMITE) 5 % cream Apply from scalp to toes, avoid face. Leave on 8-14 hours. Thoroughly rinse with soap and water. Repeat in 2 weeks if needed. 60 g 1  . triamcinolone cream (KENALOG) 0.1 % Apply topically 2 (two) times daily. Apply to affected area 45 g 1   No current facility-administered medications on file prior to visit.     Allergies:  Allergies  Allergen Reactions  . Codeine Other (See Comments)    "flu like symptoms"  . Morphine Other (See Comments)    "flu like symptoms"  . Peanut-Containing Drug Products Hives  . Sorbitol Other (See Comments)    GI Issues  . Tomato Diarrhea  . Zofran Other (See Comments)    headache  . Advil [Ibuprofen] Other (See Comments)    Irritates throat.  . Diphenhydramine Hcl Other (See Comments)    "nervous and upset"  . Oxycodone Anxiety    Confusion with inability to think clearly   Past Medical History:  Past Medical History:  Diagnosis Date  . ALKALINE PHOSPHATASE, ELEVATED 03/15/2009  . Allergic state 06/10/2012  . Allergy   . Anemia   . Anxiety and depression 04/28/2011  . Arthritis   . Asthma   . Atypical chest pain 11/30/2011  . 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 Baker's cyst of knee 05/22/2011  . Cancer ([place]) 01,  08   XRT/chemo 01-02/ lobular invasive ca  . Carotid artery disease ([place])    a. Carotid duplex 03/2014: stable 1-39% BICA, f/u due 03/2016.  [name] Chronic alcoholism in remission ([place]) 03/29/2011   Did not tolerate Klonopin, caused some confusion and bad dreams.    . Clotting disorder ([place])   . COPD (chronic obstructive pulmonary disease) ([place])   . Dermatitis 11/23/2012  . EE (eosinophilic esophagitis)   . Emphysema of lung ([place])   . Esophageal ring   . ESOPHAGEAL STRICTURE 03/29/2009  . Fall 11/23/2012  . Family history of breast cancer   . Family history of colon cancer   . Family  history of ovarian cancer   . Family history of pancreatic cancer   . Folliculitis of nose [id number]  . GERD (gastroesophageal reflux disease) 09/29/2009   improved s/p cholecystectomy and esophagus dilatation  . History of chicken pox   . History of measles   . History of shingles    2 episodes  . Hx of echocardiogram    a. Echo 01/2013: mild LVH, EF 55-60%, normal wall motion, Gr 1 diast dysfn  . Hyperlipidemia   . Knee pain, bilateral 07/23/2011  . Medicare annual wellness visit, subsequent 06/19/2015  . Mixed hyperlipidemia 10/17/2010   Qualifier: Diagnosis of  By: Mack Guise    . Orthostasis   . Osteopenia 03/14/2011  . Osteoporosis   . Personal history of chemotherapy 2001  . Personal history of radiation therapy 2001   rt breast  . 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 PVC's (premature ventricular  contractions)    a. Event monitor 01/2013: NSR, extensive PVCs.  . Radial neck fracture 10/2011   minimally displaced  . Substance abuse ([place])    In remission 3 years.   . Urinary incontinence 03/19/2012  . Vaginitis 05/22/2011   Past Surgical History:  Past Surgical History:  Procedure Laterality Date  . APPENDECTOMY    . AUGMENTATION MAMMAPLASTY Right 05/27/2007  . BREAST BIOPSY Right 01/02/2007   wire loc  . BREAST BIOPSY  12/26/2006  . BREAST LUMPECTOMY Right 2001  . BREAST RECONSTRUCTION  2008, 2009, 2010  . BREAST REDUCTION WITH MASTOPEXY Left 05/30/2017   Procedure: LEFT BREAST REDUCTION FOR SYMTERY WITH MASTOPEXY;  Surgeon: Wallace Going, DO;  Location: [facility];  Service: Plastics;  Laterality: Left;  . CHOLECYSTECTOMY  2010  . COLONOSCOPY  09/05/10   cecal avm's  . DENTAL SURGERY  05/2016   4 dental implants by Dr. Loyal Gambler.  [name] ERCP  2010    CBD stone extraction   . ESOPHAGOGASTRODUODENOSCOPY  01/08/2012   Procedure: ESOPHAGOGASTRODUODENOSCOPY (EGD);  Surgeon: Gatha Mayer, MD;  Location: [facility];  Service: Endoscopy;  Laterality:  N/A;  . ESOPHAGOGASTRODUODENOSCOPY (EGD) WITH ESOPHAGEAL DILATION  2010, 2012  . LAPAROSCOPIC APPENDECTOMY N/A 08/04/2016   Procedure: APPENDECTOMY LAPAROSCOPIC;  Surgeon: Michael Boston, MD;  Location: [facility];  Service: General;  Laterality: N/A;  . LIPOSUCTION WITH LIPOFILLING Left 11/21/2017   Procedure: LIPOSUCTION FROM ABDOMEN WITH LIPOFILLING TO LEFT BREAST;  Surgeon: Wallace Going, DO;  Location: [facility];  Service: Plastics;  Laterality: Left;  [name] MASTECTOMY MODIFIED RADICAL Right 05/27/2007   , Mastectomy modified radical (08), breast reconstruction, CA lesions excised lateral abd wall 2010  . MASTOPEXY Left 11/21/2017   Procedure: LEFT BREAST REVISION MASTOPEXY FOR SYMMETRY;  Surgeon: Wallace Going, DO;  Location: [facility];  Service: Plastics;  Laterality: Left;  . REDUCTION MAMMAPLASTY Left   . SAVORY DILATION  01/08/2012   Procedure: SAVORY DILATION;  Surgeon: Gatha Mayer, MD;  Location: [facility];  Service: Endoscopy;  Laterality: N/A;  need xray   Social History:  Social History   Socioeconomic History  . Marital status: Married    Spouse name: Sam  . Number of children: 3  . Years of education: Not on file  . Highest education level: Not on file  Occupational History  . Occupation: Social worker  Social Needs  . Financial resource strain: Not on file  . Food insecurity    Worry: Not on file    Inability: Not on file  . Transportation needs    Medical: Not on file    Non-medical: Not on file  Tobacco Use  . Smoking status: Former Smoker    Packs/day: 1.00    Years: 50.00    Pack years: 50.00    Types: Cigarettes    Quit date: 05/22/2007    Years since quitting: 12.3  . Smokeless tobacco: Never Used  Substance and Sexual Activity  . Alcohol use: Not Currently  . Drug use: No  . Sexual activity: Yes    Partners: Male    Comment: lives with husband,   Lifestyle  . Physical activity    Days per week: Not on file     Minutes per session: Not on file  . Stress: Not on file  Relationships  . Social Herbalist on phone: Not on file    Gets together: Not on file  Attends religious service: Not on file    Active member of club or organization: Not on file    Attends meetings of clubs or organizations: Not on file    Relationship status: Not on file  . Intimate partner violence    Fear of current or ex partner: Not on file    Emotionally abused: Not on file    Physically abused: Not on file    Forced sexual activity: Not on file  Other Topics Concern  . Not on file  Social History Narrative  . Not on file   Family History:  Family History  Problem Relation Age of Onset  . Heart disease Father   . Lung cancer Father        smoker  . Cirrhosis Sister        Primary Biliary  . Stroke Maternal Grandmother   . Alcohol abuse Maternal Grandfather   . Heart disease Paternal Grandfather   . Esophageal cancer Paternal Grandfather   . Rectal cancer Paternal Grandfather   . Anxiety disorder Sister   . Osteoporosis Sister   . Arthritis Sister        Rheumatoid  . Osteoporosis Sister   . Skin cancer Sister        multiple skin cancers, over 64 excisions.  . Other Mother        tic [place]  . Breast cancer Maternal Aunt        dx in her 103s  . Breast cancer Paternal Aunt        dx in her 16s  . Pancreatic cancer Maternal Uncle        dx in his 6s; smoker  . Breast cancer Maternal Aunt        dx in her 4s  . Breast cancer Maternal Aunt        possible breast cancer dx and died in her 54s  . Ovarian cancer Maternal Aunt 29  . Pancreatic cancer Maternal Aunt        dx in her 53s  . Stomach cancer Maternal Uncle   . Colon cancer Maternal Uncle   . Lung cancer Paternal Aunt   . Breast cancer Cousin        paternal first cousin  . Breast cancer Cousin        maternal first cousin  . Anesthesia problems Neg Hx   . Hypotension Neg Hx   . Malignant hyperthermia Neg Hx   .  Pseudochol deficiency Neg Hx     Review of Systems: Constitutional: Denies fevers, chills or abnormal weight loss Eyes: Denies blurriness of vision Ears, nose, mouth, throat, and face: Denies mucositis or sore throat Respiratory: Denies cough, dyspnea or wheezes Cardiovascular: Denies palpitation, chest discomfort or lower extremity swelling Gastrointestinal:  Denies nausea, constipation, diarrhea GU: Denies dysuria or incontinence Skin: Denies abnormal skin rashes Neurological: Per HPI Musculoskeletal: Denies joint pain, back or neck discomfort. No decrease in ROM Behavioral/Psych: Denies anxiety, disturbance in thought content, and mood instability   Physical Exam: Vitals:   09/18/19 1211  BP: 110/74  Pulse: 100  Resp: 18  Temp: 98.9 F (37.2 C)  SpO2: 96%   KPS: 90. General: Alert, cooperative, pleasant, in no acute distress Head: Normal EENT: No conjunctival injection or scleral icterus. Oral mucosa moist Lungs: Resp effort normal Cardiac: Regular rate and rhythm Abdomen: Soft, non-distended abdomen Skin: No rashes cyanosis or petechiae. Extremities: No clubbing or edema  Neurologic Exam: Mental Status: Awake, alert, attentive to  examiner. Oriented to self and environment. Language is fluent with intact comprehension.  Cranial Nerves: Visual acuity is grossly normal. Visual fields are full. Extra-ocular movements intact. No ptosis. Face is symmetric, tongue midline. Motor: Tone and bulk are normal. Power is full in both arms and legs. Reflexes are symmetric, no pathologic reflexes present. Intact finger to nose bilaterally Sensory: Impaired to temperature below bilateral lower shins Gait: Normal and tandem gait is deferred  Labs: I have reviewed the data as listed    Component Value Date/Time   NA 140 08/06/2019 1542   NA 141 09/04/2017 1528   K 4.5 08/06/2019 1542   K 3.8 09/04/2017 1528   CL 100 08/06/2019 1542   CL 102 11/28/2012 1043   CO2 31 08/06/2019  1542   CO2 26 09/04/2017 1528   GLUCOSE 88 08/06/2019 1542   GLUCOSE 97 09/04/2017 1528   GLUCOSE 94 11/28/2012 1043   BUN 19 08/06/2019 1542   BUN 13.2 09/04/2017 1528   CREATININE 0.63 08/06/2019 1542   CREATININE 0.77 06/10/2019 1128   CREATININE 0.8 09/04/2017 1528   CALCIUM 9.9 08/06/2019 1542   CALCIUM 9.5 09/04/2017 1528   PROT 7.0 08/06/2019 1542   PROT 7.0 09/04/2017 1528   ALBUMIN 4.3 08/06/2019 1542   ALBUMIN 3.8 09/04/2017 1528   AST 19 08/06/2019 1542   AST 17 06/10/2019 1128   AST 19 09/04/2017 1528   ALT 15 08/06/2019 1542   ALT 17 06/10/2019 1128   ALT 17 09/04/2017 1528   ALKPHOS 96 08/06/2019 1542   ALKPHOS 107 09/04/2017 1528   BILITOT 0.3 08/06/2019 1542   BILITOT 0.4 06/10/2019 1128   BILITOT 0.34 09/04/2017 1528   GFRNONAA >60 06/10/2019 1128   GFRAA >60 06/10/2019 1128   Lab Results  Component Value Date   WBC 6.2 09/14/2019   NEUTROABS 4.0 09/14/2019   HGB 12.7 09/14/2019   HCT 39.3 09/14/2019   MCV 95.4 09/14/2019   PLT 270 09/14/2019      Assessment/Plan Chemotherapy associated neuropathy  Ms. Giger presents with a symmetric, length dependent, small fiber polyneuropathy.  We discussed likely etiology which is remote history of cytotoxic chemotherapy exposure, plus recent anemia and normal effects of aging.    Serologic workup performed recently includes TSH, B12 and routine studies.  No further blooodwork indicated at this time.   No role for EMG at this time.  We discussed symptomatic management of pain symptoms with Gabapentin, recommended starting dose 300mg  HS.  She understands this will not treat the numbness component.    We spent twenty additional minutes teaching regarding the natural history, biology, and historical experience in the treatment of neurologic complications of cancer. We also provided teaching sheets for the patient to take home as an additional resource.  We appreciate the opportunity to participate in the care of  [place].  We will follow up with her via phone in 1 month to assess response to Gabapentin.  All questions were answered. The patient knows to call the clinic with any problems, questions or concerns. No barriers to learning were detected.  The total time spent in the encounter was 40 minutes and more than 50% was on counseling and review of test results   Ventura Sellers, MD Medical Director of Neuro-Oncology [facility] [place] 09/18/19 3:57 PM

## 2019-09-21 DIAGNOSIS — H04123 Dry eye syndrome of bilateral lacrimal glands: Secondary | ICD-10-CM | POA: Diagnosis not present

## 2019-09-21 DIAGNOSIS — H26493 Other secondary cataract, bilateral: Secondary | ICD-10-CM | POA: Diagnosis not present

## 2019-09-21 DIAGNOSIS — H353211 Exudative age-related macular degeneration, right eye, with active choroidal neovascularization: Secondary | ICD-10-CM | POA: Diagnosis not present

## 2019-09-21 DIAGNOSIS — H353122 Nonexudative age-related macular degeneration, left eye, intermediate dry stage: Secondary | ICD-10-CM | POA: Diagnosis not present

## 2019-09-21 DIAGNOSIS — Z961 Presence of intraocular lens: Secondary | ICD-10-CM | POA: Diagnosis not present

## 2019-09-24 NOTE — Progress Notes (Signed)
Cardiology Office Note   Date:  09/25/2019   ID:  Ilo, Harte [birth year], MRN [id number]  PCP:  [name], MD  Cardiologist:   Dorris Carnes, MD   Pt presents for f/u of dizziness      History of Present Illness: Anne [name] is a 78 y.o. female with a history of orthostatic intolerance, carotid stenosis, HL, breat CA, stage IV  COPD, anemia, GERD, eosinophilic esophagitis, Echo 3/14: Mild LVH, EF 0000000, grade 1 diastolic dysfunction. Event monitor 01/2013: NSR, extensive PVCs. Carotid US [id number]: RICA [id number], LICA [id number] (f/u 1 year). Myoview 4/14: Low risk, no scar or ischemia, not gated. She has been treated with low dose metoprolol for PVCs and palps.   I saw the pt in 2018   She was seen by B Bhagat in 2019     Since I saw the patient she said she is doing quite well.  She has become vegetarian.  The patient denies dizziness.  Breathing is fair she has stage IV COPD.  Denies wheezing.  The patient denies chest pain.  Does have some complaints of numbness in feet   Seen in neuro   Tried a med but felt horrible after   Stopped   Has not cone back     Current Meds  Medication Sig  . acetaminophen (TYLENOL) 500 MG tablet Take 500-1,000 mg by mouth every 6 (six) hours as needed (for pain.).  [name] amitriptyline (ELAVIL) 25 MG tablet TAKE 6 TABLETS(150 MG) BY MOUTH AT BEDTIME  . atorvastatin (LIPITOR) 20 MG tablet TAKE 1 TABLET(20 MG) BY MOUTH TWICE DAILY  . Biotin (BIOTIN 5000) 5 MG CAPS Take by mouth.  . Cholecalciferol (VITAMIN D3) 1000 UNITS CAPS Take 2,000 Units by mouth daily.   . Cyanocobalamin (VITAMIN B 12 PO) Take 1 tablet by mouth daily.  [name] diltiazem (CARDIZEM) 30 MG tablet TAKE 1 TABLET BY MOUTH EVERY DAY FOR PALPITATION  . Ferrous Fumarate-Folic Acid 99991111 MG TABS Take 1 tablet by mouth daily.  [name] loratadine (CLARITIN) 10 MG tablet Take 1 tablet (10 mg total) by mouth daily.  [name] LORazepam (ATIVAN) 1 MG tablet TAKE 1 TABLET(1 MG) BY MOUTH EVERY 8 HOURS AS  NEEDED FOR ANXIETY  . pantoprazole (PROTONIX) 40 MG tablet TAKE 1 TABLET(40 MG) BY MOUTH DAILY  . permethrin (ELIMITE) 5 % cream Apply from scalp to toes, avoid face. Leave on 8-14 hours. Thoroughly rinse with soap and water. Repeat in 2 weeks if needed.  . triamcinolone cream (KENALOG) 0.1 % Apply topically 2 (two) times daily. Apply to affected area     Allergies:   Codeine, Morphine, Peanut-containing drug products, Sorbitol, Tomato, Zofran, Advil [ibuprofen], Diphenhydramine hcl, and Oxycodone   Past Medical History:  Diagnosis Date  . ALKALINE PHOSPHATASE, ELEVATED 03/15/2009  . Allergic state 06/10/2012  . Allergy   . Anemia   . Anxiety and depression 04/28/2011  . Arthritis   . Asthma   . Atypical chest pain 11/30/2011  . 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 Baker's cyst of knee 05/22/2011  . Cancer ([name]) 01,  08   XRT/chemo 01-02/ lobular invasive ca  . Carotid artery disease ([place])    a. Carotid duplex 03/2014: stable 1-39% BICA, f/u due 03/2016.  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    . Clotting disorder ([place])   . COPD (chronic obstructive pulmonary disease) (  [place])   . Dermatitis 11/23/2012  . EE (eosinophilic esophagitis)   . Emphysema of lung ([place])   . Esophageal ring   . ESOPHAGEAL STRICTURE 03/29/2009  . Fall 11/23/2012  . Family history of breast cancer   . Family history of colon cancer   . Family history of ovarian cancer   . Family history of pancreatic cancer   . Folliculitis of nose [id number]  . GERD (gastroesophageal reflux disease) 09/29/2009   improved s/p cholecystectomy and esophagus dilatation  . History of chicken pox   . History of measles   . History of shingles    2 episodes  . Hx of echocardiogram    a. Echo 01/2013: mild LVH, EF 55-60%, normal wall motion, Gr 1 diast dysfn  . Hyperlipidemia   . Knee pain, bilateral 07/23/2011  . Medicare annual  wellness visit, subsequent 06/19/2015  . Mixed hyperlipidemia 10/17/2010   Qualifier: Diagnosis of  By: Mack Guise    . Orthostasis   . Osteopenia 03/14/2011  . Osteoporosis   . Personal history of chemotherapy 2001  . Personal history of radiation therapy 2001   rt breast  . 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 PVC's (premature ventricular contractions)    a. Event monitor 01/2013: NSR, extensive PVCs.  . Radial neck fracture 10/2011   minimally displaced  . Substance abuse ([place])    In remission 3 years.   . Urinary incontinence 03/19/2012  . Vaginitis 05/22/2011    Past Surgical History:  Procedure Laterality Date  . APPENDECTOMY    . AUGMENTATION MAMMAPLASTY Right 05/27/2007  . BREAST BIOPSY Right 01/02/2007   wire loc  . BREAST BIOPSY  12/26/2006  . BREAST LUMPECTOMY Right 2001  . BREAST RECONSTRUCTION  2008, 2009, 2010  . BREAST REDUCTION WITH MASTOPEXY Left 05/30/2017   Procedure: LEFT BREAST REDUCTION FOR SYMTERY WITH MASTOPEXY;  Surgeon: Wallace Going, DO;  Location: [facility];  Service: Plastics;  Laterality: Left;  . CHOLECYSTECTOMY  2010  . COLONOSCOPY  09/05/10   cecal avm's  . DENTAL SURGERY  05/2016   4 dental implants by Dr. Loyal Gambler.  [name] ERCP  2010    CBD stone extraction   . ESOPHAGOGASTRODUODENOSCOPY  01/08/2012   Procedure: ESOPHAGOGASTRODUODENOSCOPY (EGD);  Surgeon: Gatha Mayer, MD;  Location: [facility];  Service: Endoscopy;  Laterality: N/A;  . ESOPHAGOGASTRODUODENOSCOPY (EGD) WITH ESOPHAGEAL DILATION  2010, 2012  . LAPAROSCOPIC APPENDECTOMY N/A 08/04/2016   Procedure: APPENDECTOMY LAPAROSCOPIC;  Surgeon: Michael Boston, MD;  Location: [facility];  Service: General;  Laterality: N/A;  . LIPOSUCTION WITH LIPOFILLING Left 11/21/2017   Procedure: LIPOSUCTION FROM ABDOMEN WITH LIPOFILLING TO LEFT BREAST;  Surgeon: Wallace Going, DO;  Location: [facility];  Service: Plastics;  Laterality: Left;  [name] MASTECTOMY MODIFIED  RADICAL Right 05/27/2007   , Mastectomy modified radical (08), breast reconstruction, CA lesions excised lateral abd wall 2010  . MASTOPEXY Left 11/21/2017   Procedure: LEFT BREAST REVISION MASTOPEXY FOR SYMMETRY;  Surgeon: Wallace Going, DO;  Location: [facility];  Service: Plastics;  Laterality: Left;  . REDUCTION MAMMAPLASTY Left   . SAVORY DILATION  01/08/2012   Procedure: SAVORY DILATION;  Surgeon: Gatha Mayer, MD;  Location: [facility];  Service: Endoscopy;  Laterality: N/A;  need xray     Social History:  The patient  reports that she quit smoking about 12 years ago. Her smoking use included cigarettes. She has a 50.00 pack-year smoking history.  She has never used smokeless tobacco. She reports previous alcohol use. She reports that she does not use drugs.   Family History:  The patient's family history includes Alcohol abuse in her maternal grandfather; Anxiety disorder in her sister; Arthritis in her sister; Breast cancer in her cousin, cousin, maternal aunt, maternal aunt, maternal aunt, and paternal aunt; Cirrhosis in her sister; Colon cancer in her maternal uncle; Esophageal cancer in her paternal grandfather; Heart disease in her father and paternal grandfather; Lung cancer in her father and paternal aunt; Osteoporosis in her sister and sister; Other in her mother; Ovarian cancer (age of onset: 65) in her maternal aunt; Pancreatic cancer in her maternal aunt and maternal uncle; Rectal cancer in her paternal grandfather; Skin cancer in her sister; Stomach cancer in her maternal uncle; Stroke in her maternal grandmother.    ROS:  Please see the history of present illness. All other systems are reviewed and  Negative to the above problem except as noted.    PHYSICAL EXAM: VS:  BP 108/68   Pulse 90   Ht 5' 5.5" (1.664 m)   Wt 133 lb 3.2 oz (60.4 kg)   BMI 21.83 kg/m   GEN: Well nourished, well developed, in no acute distress  HEENT: normal  Neck: no JVD,  carotid bruits, or masses Cardiac: RRR; no murmurs, rubs, or gallops,no edema  Respiratory:  clear to auscultation bilaterally,Moving air OK   GI: soft, nontender, nondistended, + BS  No hepatomegaly  MS: no deformity Moving all extremities   Skin: warm and dry, no rash Neuro:  Strength and sensation are intact Psych: euthymic mood, full affect   EKG:  EKG is ordered today.  SR 90 bpm   RBBB  Nonspecific ST changes    Lipid Panel    Component Value Date/Time   CHOL 161 08/06/2019 1542   TRIG 147.0 08/06/2019 1542   HDL 58.40 08/06/2019 1542   CHOLHDL 3 08/06/2019 1542   VLDL 29.4 08/06/2019 1542   LDLCALC 73 08/06/2019 1542   LDLDIRECT 117.4 01/13/2013 1210      Wt Readings from Last 3 Encounters:  09/25/19 133 lb 3.2 oz (60.4 kg)  09/18/19 133 lb 9.6 oz (60.6 kg)  09/14/19 133 lb 4.8 oz (60.5 kg)      ASSESSMENT AND PLAN:  1  Hx orthostasis  Pt denies dizziness    2  CV dz  Mild  Keep on statin  Liipds excellent   Follow BP    3  HL  Continue statin      4  CP  Denies    5  PVCs   Denies palpitations    6  Hx Breast CA  Follows in oncology  7  Neuropathy   Follows in neuro    F/U in 1 year, sooner if problems    Current medicines are reviewed at length with the patient today.  The patient does not have concerns regarding medicines.  Signed, Dorris Carnes, MD  09/25/2019 3:17 PM    [facility] [address], [place], [place]  [address] Phone: [phone]; Fax: [phone]

## 2019-09-25 ENCOUNTER — Encounter: Payer: Self-pay | Admitting: Internal Medicine

## 2019-09-25 ENCOUNTER — Other Ambulatory Visit: Payer: Self-pay

## 2019-09-25 ENCOUNTER — Ambulatory Visit: Payer: PPO | Admitting: Internal Medicine

## 2019-09-25 VITALS — BP 108/68 | HR 90 | Ht 65.5 in | Wt 133.2 lb

## 2019-09-25 DIAGNOSIS — E782 Mixed hyperlipidemia: Secondary | ICD-10-CM

## 2019-09-25 NOTE — Patient Instructions (Signed)
Medication Instructions:  No changes *If you need a refill on your cardiac medications before your next appointment, please call your pharmacy*  Lab Work: none If you have labs (blood work) drawn today and your tests are completely normal, you will receive your results only by: [name] MyChart Message (if you have MyChart) OR . A paper copy in the mail If you have any lab test that is abnormal or we need to change your treatment, we will call you to review the results.  Testing/Procedures: none  Follow-Up: At [facility], you and your health needs are our priority.  As part of our continuing mission to provide you with exceptional heart care, we have created designated Provider Care Teams.  These Care Teams include your primary Cardiologist (physician) and Advanced Practice Providers (APPs -  Physician Assistants and Nurse Practitioners) who all work together to provide you with the care you need, when you need it.  Your next appointment:   12-15 months  The format for your next appointment:   In Person  Provider:   Dorris Carnes, MD  Other Instructions

## 2019-10-05 ENCOUNTER — Telehealth: Payer: Self-pay | Admitting: Internal Medicine

## 2019-10-05 NOTE — Telephone Encounter (Signed)
Patient has been communicating with Dr. Harrington Challenger via [place]. She states she would like to have another EKG done.

## 2019-10-15 ENCOUNTER — Encounter: Payer: Self-pay | Admitting: Hematology

## 2019-10-15 DIAGNOSIS — M1612 Unilateral primary osteoarthritis, left hip: Secondary | ICD-10-CM | POA: Diagnosis not present

## 2019-10-15 DIAGNOSIS — M1611 Unilateral primary osteoarthritis, right hip: Secondary | ICD-10-CM | POA: Diagnosis not present

## 2019-10-15 DIAGNOSIS — M545 Low back pain: Secondary | ICD-10-CM | POA: Diagnosis not present

## 2019-10-15 DIAGNOSIS — M25551 Pain in right hip: Secondary | ICD-10-CM | POA: Diagnosis not present

## 2019-10-19 ENCOUNTER — Encounter: Payer: Self-pay | Admitting: Hematology

## 2019-10-21 ENCOUNTER — Encounter: Payer: Self-pay | Admitting: Internal Medicine

## 2019-10-21 NOTE — Progress Notes (Signed)
[place]   [phone] Fax:([phone]) [phone]   Clinic Follow up Note   Patient Care Team: [name], MD as PCP - General (Family Medicine) Fay Records, MD as PCP - Cardiology (Cardiology) Paralee Cancel, MD as Consulting Physician (Orthopedic Surgery) Gatha Mayer, MD as Consulting Physician (Gastroenterology) Clent Jacks, MD as Consulting Physician (Ophthalmology) Roney Jaffe, DDS (Oral Surgery) Everlene Other (Dentistry) [place], Jennefer Bravo, MD as Referring Physician (Dermatology) Dillingham, Loel Lofty, DO as Attending Physician (Plastic Surgery) Truitt Merle, MD as Consulting Physician (Hematology)  Date of Service:  10/22/2019  CHIEF COMPLAINT:   right clavicular pain   SUMMARY OF ONCOLOGIC HISTORY: Oncology History  Breast cancer, right breast ([place])  01/2000 Initial Diagnosis   Breast cancer, right breast ([place])  History of primary lobular right breast carcinoma. 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 I believe that she had local radiation, tho that information is not available in this EMR.   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 2008 (tho patient did not agree to surgery until July 2008), invasive lobular carcinoma ER 81%, PR 96%, Her 2 1+ (PM08-99) , 1.5 cm invasive lobular with 3 axillary nodes negative, LVSI and perineural invasion present [id number]). She subsequently declined adjuvant systemic treatment . She had a complicated course following right mastectomy, with difficult healing after right breast reconstruction.    07/2009  Relapse/Recurrence   She had skin recurrence lateral right chest wall excised in Sept 2010 then treated with xeloda from Oct 2010 thru Feb 2011 and began on Arimidex July 2011; she may have stopped Arimidex after bone density scan 02-2014, or possibly the year prior (?).    2015 Imaging   Bone density scan [facility] 02-15-14 still osteopenic range, slightly lower in LS compared with 2013 and stable in hips.      CURRENT THERAPY:  Surveillance  INTERVAL HISTORY:  Anne [name] is here for pain of superior-medial right clavicle and right neck. There is pain with raising her right arm. Her pain is 6/10. She presents to the clinic with her husband.    This pain started 2 months ago and resolved then recurred since. She notes her pain is constant and will flare based on activity level. She has been taking asper cream and Tylenol. She notes she fell after tripping on a rug but landed on her right knee and right face 4 months ago. She denies falling on her right shoulder. She denies pain anywhere else currently. 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    REVIEW OF SYSTEMS:   Constitutional: Denies fevers, chills or abnormal weight loss Eyes: Denies blurriness of vision Ears, nose, mouth, throat, and face: Denies mucositis or sore throat Respiratory: Denies cough, dyspnea or wheezes Cardiovascular: Denies palpitation, chest discomfort or lower extremity swelling Gastrointestinal:  Denies nausea, heartburn or change in bowel habits Skin: Denies abnormal skin rashes MSK: (+) Right superior-medial clavicular pain (+) Limited ROM of right shoulder  Lymphatics: Denies new lymphadenopathy or easy bruising Neurological: (+) Neuropathy of feet stable  Behavioral/Psych: Mood is stable, no new changes  All other systems were  reviewed with the patient and are negative.  MEDICAL HISTORY:  Past Medical History:  Diagnosis Date  . ALKALINE PHOSPHATASE, ELEVATED 03/15/2009  . Allergic state 06/10/2012   . Allergy   . Anemia   . Anxiety and depression 04/28/2011  . Arthritis   . Asthma   . Atypical chest pain 11/30/2011  . 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 Baker's cyst of knee 05/22/2011  . Cancer ([place]) 01,  08   XRT/chemo 01-02/ lobular invasive ca  . Carotid artery disease ([place])    a. Carotid duplex 03/2014: stable 1-39% BICA, f/u due 03/2016.  [name] Chronic alcoholism in remission ([place]) 03/29/2011   Did not tolerate Klonopin, caused some confusion and bad dreams.    . Clotting disorder ([place])   . COPD (chronic obstructive pulmonary disease) ([place])   . Dermatitis 11/23/2012  . EE (eosinophilic esophagitis)   . Emphysema of lung ([place])   . Esophageal ring   . ESOPHAGEAL STRICTURE 03/29/2009  . Fall 11/23/2012  . Family history of breast cancer   . Family history of colon cancer   . Family history of ovarian cancer   . Family history of pancreatic cancer   . Folliculitis of nose [id number]  . GERD (gastroesophageal reflux disease) 09/29/2009   improved s/p cholecystectomy and esophagus dilatation  . History of chicken pox   . History of measles   . History of shingles    2 episodes  . Hx of echocardiogram    a. Echo 01/2013: mild LVH, EF 55-60%, normal wall motion, Gr 1 diast dysfn  . Hyperlipidemia   . Knee pain, bilateral 07/23/2011  . Medicare annual wellness visit, subsequent 06/19/2015  . Mixed hyperlipidemia 10/17/2010   Qualifier: Diagnosis of  By: Mack Guise    . Orthostasis   . Osteopenia 03/14/2011  . Osteoporosis   . Personal history of chemotherapy 2001  . Personal history of radiation therapy 2001   rt breast  . 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 PVC's (premature ventricular contractions)    a. Event monitor 01/2013: NSR, extensive PVCs.  . Radial neck fracture 10/2011   minimally displaced  . Substance abuse ([place])    In remission 3 years.   . Urinary incontinence 03/19/2012  . Vaginitis 05/22/2011    SURGICAL HISTORY: Past Surgical  History:  Procedure Laterality Date  . APPENDECTOMY    . AUGMENTATION MAMMAPLASTY Right 05/27/2007  . BREAST BIOPSY Right 01/02/2007   wire loc  . BREAST BIOPSY  12/26/2006  . BREAST LUMPECTOMY Right 2001  . BREAST RECONSTRUCTION  2008, 2009, 2010  . BREAST REDUCTION WITH MASTOPEXY Left 05/30/2017   Procedure: LEFT BREAST REDUCTION FOR SYMTERY WITH MASTOPEXY;  Surgeon: Wallace Going, DO;  Location: [place];  Service: Plastics;  Laterality: Left;  . CHOLECYSTECTOMY  2010  . COLONOSCOPY  09/05/10   cecal avm's  . DENTAL SURGERY  05/2016   4 dental implants by Dr. Loyal Gambler.  [name] ERCP  2010    CBD stone extraction   . ESOPHAGOGASTRODUODENOSCOPY  01/08/2012   Procedure: ESOPHAGOGASTRODUODENOSCOPY (EGD);  Surgeon: Gatha Mayer, MD;  Location: [facility];  Service: Endoscopy;  Laterality: N/A;  . ESOPHAGOGASTRODUODENOSCOPY (EGD) WITH ESOPHAGEAL DILATION  2010, 2012  . LAPAROSCOPIC APPENDECTOMY N/A 08/04/2016   Procedure: APPENDECTOMY LAPAROSCOPIC;  Surgeon: Michael Boston, MD;  Location: [facility];  Service: General;  Laterality: N/A;  . LIPOSUCTION WITH LIPOFILLING Left 11/21/2017   Procedure: LIPOSUCTION FROM ABDOMEN WITH  LIPOFILLING TO LEFT BREAST;  Surgeon: Wallace Going, DO;  Location: [facility];  Service: Plastics;  Laterality: Left;  [name] MASTECTOMY MODIFIED RADICAL Right 05/27/2007   , Mastectomy modified radical (08), breast reconstruction, CA lesions excised lateral abd wall 2010  . MASTOPEXY Left 11/21/2017   Procedure: LEFT BREAST REVISION MASTOPEXY FOR SYMMETRY;  Surgeon: Wallace Going, DO;  Location: [place];  Service: Plastics;  Laterality: Left;  . REDUCTION MAMMAPLASTY Left   . SAVORY DILATION  01/08/2012   Procedure: SAVORY DILATION;  Surgeon: Gatha Mayer, MD;  Location: [facility];  Service: Endoscopy;  Laterality: N/A;  need xray    I have reviewed the social history and family history with the patient and  they are unchanged from previous note.  ALLERGIES:  is allergic to codeine; morphine; peanut-containing drug products; sorbitol; tomato; zofran; advil [ibuprofen]; diphenhydramine hcl; and oxycodone.  MEDICATIONS:  Current Outpatient Medications  Medication Sig Dispense Refill  . acetaminophen (TYLENOL) 500 MG tablet Take 500-1,000 mg by mouth every 6 (six) hours as needed (for pain.).    [name] amitriptyline (ELAVIL) 25 MG tablet TAKE 6 TABLETS(150 MG) BY MOUTH AT BEDTIME 180 tablet 1  . atorvastatin (LIPITOR) 20 MG tablet TAKE 1 TABLET(20 MG) BY MOUTH TWICE DAILY 180 tablet 3  . Biotin (BIOTIN 5000) 5 MG CAPS Take by mouth.    . Cholecalciferol (VITAMIN D3) 1000 UNITS CAPS Take 2,000 Units by mouth daily.     . Cyanocobalamin (VITAMIN B 12 PO) Take 1 tablet by mouth daily.    [name] diltiazem (CARDIZEM) 30 MG tablet TAKE 1 TABLET BY MOUTH EVERY DAY FOR PALPITATION 90 tablet 0  . Ferrous Fumarate-Folic Acid 99991111 MG TABS Take 1 tablet by mouth daily. 30 each 3  . loratadine (CLARITIN) 10 MG tablet Take 1 tablet (10 mg total) by mouth daily. 30 tablet 11  . LORazepam (ATIVAN) 1 MG tablet TAKE 1 TABLET(1 MG) BY MOUTH EVERY 8 HOURS AS NEEDED FOR ANXIETY 70 tablet 1  . pantoprazole (PROTONIX) 40 MG tablet TAKE 1 TABLET(40 MG) BY MOUTH DAILY 90 tablet 1  . permethrin (ELIMITE) 5 % cream Apply from scalp to toes, avoid face. Leave on 8-14 hours. Thoroughly rinse with soap and water. Repeat in 2 weeks if needed. 60 g 1  . triamcinolone cream (KENALOG) 0.1 % Apply topically 2 (two) times daily. Apply to affected area 45 g 1   No current facility-administered medications for this visit.    PHYSICAL EXAMINATION: ECOG PERFORMANCE STATUS: 1 - Symptomatic but completely ambulatory  Vitals:   10/22/19 1403  BP: 113/82  Pulse: (!) 106  Resp: 18  Temp: 98.5 F (36.9 C)  SpO2: 97%   Filed Weights   10/22/19 1403  Weight: 135 lb (61.2 kg)    GENERAL:alert, no distress and comfortable SKIN: skin color,  texture, turgor are normal, no rashes or significant lesions (+) several papular rash in the upper inner quadrant of right breast, with mild skin erythema  EYES: normal, Conjunctiva are pink and non-injected, sclera clear  NECK: supple, thyroid normal size, non-tender, without nodularity LYMPH:  no palpable lymphadenopathy in the cervical, axillary  LUNGS: clear to auscultation and percussion with normal breathing effort HEART: regular rate & rhythm and no murmurs and no lower extremity edema ABDOMEN:abdomen soft, non-tender and normal bowel sounds Musculoskeletal:no cyanosis of digits and no clubbing (+) Tenderness of superior-medial clavicle and sternoclavicular joint NEURO: alert & oriented x 3 with fluent speech,  no focal motor/sensory deficits BREAST: S/p right mastectomy and implant reconstruction: Surgical incision healed well. No palpable mass, nodules or adenopathy bilaterally.  Left breast exam benign.  No axillary adenopathy.  LABORATORY DATA:  I have reviewed the data as listed CBC Latest Ref Rng & Units 09/14/2019 06/10/2019 04/28/2019  WBC 4.0 - 10.5 K/uL 6.2 6.7 6.6  Hemoglobin 12.0 - 15.0 g/dL 12.7 12.3 12.7  Hematocrit 36.0 - 46.0 % 39.3 38.0 38.5  Platelets 150 - 400 K/uL 270 255 267.0     CMP Latest Ref Rng & Units 08/06/2019 06/10/2019 10/31/2018  Glucose 70 - 99 mg/dL 88 96 103(H)  BUN 6 - 23 mg/dL 19 21 14   Creatinine 0.40 - 1.20 mg/dL 0.63 0.77 0.72  Sodium 135 - 145 mEq/L 140 140 142  Potassium 3.5 - 5.1 mEq/L 4.5 3.9 3.8  Chloride 96 - 112 mEq/L 100 105 104  CO2 19 - 32 mEq/L 31 27 28   Calcium 8.4 - 10.5 mg/dL 9.9 9.7 9.2  Total Protein 6.0 - 8.3 g/dL 7.0 7.3 7.0  Total Bilirubin 0.2 - 1.2 mg/dL 0.3 0.4 0.3  Alkaline Phos 39 - 117 U/L 96 90 125  AST 0 - 37 U/L 19 17 17   ALT 0 - 35 U/L 15 17 18       RADIOGRAPHIC STUDIES: I have personally reviewed the radiological images as listed and agreed with the findings in the report. No results found.   ASSESSMENT  & PLAN:  Anne [name] is a 78 y.o. female with    1. Right Clavicular pain  -For the past 2 months she has superior-medial clavicular pain that is now constant and flares around 6/10, can radiate to her right neck and right shoulder and cause pain when lifting right shoulder.  -On exam there is tenderness at the [facility] joint and medial clavicle. She also has skin rash in upper-inner quadrant right breasst with slight skin erythema.  -She did have fall to right knee and right side of face 4 months ago. However her clinical presentation is not consistent with fall related injury.  -I recommend getting a x-ray today for further evaluate. She is very concerned about radiation exposure and declined, she also declined CT scan.  She is agreeable with MRI.  She is scheduled for breast MRI screening for breast cancer next week, I discussed with radiology, and will request her breast MRI to cover clavicle and [place] joint -I discussed if scan is negative this could be arthritis or muscular related.  -She can continue Tylenol and heat pad for pain. She can use OTC hydrocortisone for her rash   2. History of recurrent right breast lobular carcinoma, in 2001, 2008 and chest wall recurrence in 2010 -She was initially diagnosed in 01/2000 but unfortunately hadrecurrencetwice, the last time involvingthe chest wall in 2010.  -At that time she was treated with Xeloda and Exemestane 2011-2015.She is now on surveillance. -She has been anxious about another breast cancer or recurrence. She is interested in left mastectomy. I previously discussed given her age and comorbiditiesI do not recommendmastectomy and it is not medically necessary. I recommend adding breast MRI to screening. She is interested. Plan to start in 10/2019 and continue yearly.  -Continue surveillance.   3. Radiation Pulmonary Fibrosis, COPD, Emphysema -She continues to f/u with Pulmonologist   4. Neuropathy of soles of feet  -She notes this  has been ongoing for some time but has recently become constant and tingling increased. She denies burning pain  -  I discussed Gabapentin can help her tingling. She will consider it if tingling worsens. It's tolerable now  -She is a vegetarian but takes Oral B12. Her B12 level is normal.  -She was scheduled for virtual visit with Dr. Mickeal Skinner today but cancelled as she came in today. She will wait to reschedule.     PLAN: -Screening Breast MRI on 10/29/19, I called and discussed with radiologist. I will call [facility] on 12/17 to request her breast MRI to cover clavicles, sternum and [place] joint  -Lab and F/u on 1/29 as it's scheduled    No problem-specific Assessment & Plan notes found for this encounter.   No orders of the defined types were placed in this encounter.  All questions were answered. The patient knows to call the clinic with any problems, questions or concerns. No barriers to learning was detected. I spent 15 minutes counseling the patient face to face. The total time spent in the appointment was 20 minutes and more than 50% was on counseling and review of test results     Truitt Merle, MD 10/22/2019   I, Joslyn Devon, am acting as scribe for Truitt Merle, MD.   I have reviewed the above documentation for accuracy and completeness, and I agree with the above.

## 2019-10-22 ENCOUNTER — Inpatient Hospital Stay: Payer: PPO | Admitting: Internal Medicine

## 2019-10-22 ENCOUNTER — Other Ambulatory Visit: Payer: Self-pay

## 2019-10-22 ENCOUNTER — Inpatient Hospital Stay: Payer: PPO | Attending: Hematology | Admitting: Hematology

## 2019-10-22 ENCOUNTER — Encounter: Payer: Self-pay | Admitting: Hematology

## 2019-10-22 ENCOUNTER — Telehealth: Payer: Self-pay

## 2019-10-22 VITALS — BP 113/82 | HR 106 | Temp 98.5°F | Resp 18 | Ht 65.5 in | Wt 135.0 lb

## 2019-10-22 DIAGNOSIS — Z7982 Long term (current) use of aspirin: Secondary | ICD-10-CM | POA: Insufficient documentation

## 2019-10-22 DIAGNOSIS — Z79899 Other long term (current) drug therapy: Secondary | ICD-10-CM | POA: Insufficient documentation

## 2019-10-22 DIAGNOSIS — G629 Polyneuropathy, unspecified: Secondary | ICD-10-CM | POA: Insufficient documentation

## 2019-10-22 DIAGNOSIS — M8589 Other specified disorders of bone density and structure, multiple sites: Secondary | ICD-10-CM | POA: Diagnosis not present

## 2019-10-22 DIAGNOSIS — Z170 Estrogen receptor positive status [ER+]: Secondary | ICD-10-CM | POA: Insufficient documentation

## 2019-10-22 DIAGNOSIS — F419 Anxiety disorder, unspecified: Secondary | ICD-10-CM | POA: Diagnosis not present

## 2019-10-22 DIAGNOSIS — Z9011 Acquired absence of right breast and nipple: Secondary | ICD-10-CM | POA: Diagnosis not present

## 2019-10-22 DIAGNOSIS — F329 Major depressive disorder, single episode, unspecified: Secondary | ICD-10-CM | POA: Insufficient documentation

## 2019-10-22 DIAGNOSIS — Z853 Personal history of malignant neoplasm of breast: Secondary | ICD-10-CM | POA: Insufficient documentation

## 2019-10-22 DIAGNOSIS — J449 Chronic obstructive pulmonary disease, unspecified: Secondary | ICD-10-CM | POA: Diagnosis not present

## 2019-10-22 DIAGNOSIS — D509 Iron deficiency anemia, unspecified: Secondary | ICD-10-CM | POA: Diagnosis not present

## 2019-10-22 NOTE — Telephone Encounter (Signed)
Left voice message for patient that Dr. Burr Medico has schedule her for breast MRI on Thursday 12/17 at 2:40 at [facility] arrive by 2:00 pm.  Encouraged her to call back if she has any questions.

## 2019-10-23 ENCOUNTER — Telehealth: Payer: Self-pay | Admitting: Hematology

## 2019-10-23 NOTE — Telephone Encounter (Signed)
No los per 12/10 

## 2019-10-26 ENCOUNTER — Encounter: Payer: Self-pay | Admitting: Internal Medicine

## 2019-10-27 ENCOUNTER — Telehealth: Payer: Self-pay | Admitting: Hematology

## 2019-10-27 NOTE — Telephone Encounter (Signed)
YF Call day 1/29 moved lab/fu to AM. confirmed with patient.

## 2019-10-28 ENCOUNTER — Other Ambulatory Visit: Payer: Self-pay | Admitting: Orthopedic Surgery

## 2019-10-28 DIAGNOSIS — M25551 Pain in right hip: Secondary | ICD-10-CM

## 2019-10-29 ENCOUNTER — Ambulatory Visit
Admission: RE | Admit: 2019-10-29 | Discharge: 2019-10-29 | Disposition: A | Discharge status: Home / Self Care | Payer: PPO | Source: Ambulatory Visit | Attending: Hematology | Admitting: Hematology

## 2019-10-29 ENCOUNTER — Other Ambulatory Visit: Payer: Self-pay

## 2019-10-29 DIAGNOSIS — Z853 Personal history of malignant neoplasm of breast: Secondary | ICD-10-CM

## 2019-10-29 MED ORDER — GADOBUTROL 1 MMOL/ML IV SOLN
6.0000 mL | Freq: Once | INTRAVENOUS | Status: AC | PRN
  Administered 2019-10-29: 6 mL via INTRAVENOUS

## 2019-10-30 ENCOUNTER — Telehealth: Payer: Self-pay | Admitting: Hematology

## 2019-10-30 NOTE — Telephone Encounter (Signed)
I called patient back, and left a voice message.  I told her the breast MRI was negative, no concern for breast cancer.  My nurse call [facility] imaging yesterday, and spoke with the MRI technician.  She was told the breast MRI would not be able to cover clavicular area.  I asked patient to call me back or send [place] a message, I recommend observation for now, and we can order a bone scan if her clavicular pain does not resolve in the next 3 to 4 weeks.Pt previously declined CT scan due to the concern of radiation exposure.  Truitt Merle  10/30/2019

## 2019-11-02 ENCOUNTER — Other Ambulatory Visit: Payer: Self-pay

## 2019-11-02 ENCOUNTER — Ambulatory Visit
Admission: RE | Admit: 2019-11-02 | Discharge: 2019-11-02 | Disposition: A | Discharge status: Home / Self Care | Payer: PPO | Source: Ambulatory Visit | Attending: Orthopedic Surgery | Admitting: Orthopedic Surgery

## 2019-11-02 DIAGNOSIS — M25551 Pain in right hip: Secondary | ICD-10-CM | POA: Diagnosis not present

## 2019-11-02 MED ORDER — IOPAMIDOL (ISOVUE-M 200) INJECTION 41%
1.0000 mL | Freq: Once | INTRAMUSCULAR | Status: AC
  Administered 2019-11-02: 1 mL via INTRA_ARTICULAR

## 2019-11-02 MED ORDER — METHYLPREDNISOLONE ACETATE 40 MG/ML INJ SUSP (RADIOLOG
120.0000 mg | Freq: Once | INTRAMUSCULAR | Status: AC
  Administered 2019-11-02: 120 mg via INTRA_ARTICULAR

## 2019-11-08 ENCOUNTER — Encounter: Payer: Self-pay | Admitting: Hematology

## 2019-11-09 ENCOUNTER — Other Ambulatory Visit: Payer: Self-pay | Admitting: Internal Medicine

## 2019-11-10 ENCOUNTER — Other Ambulatory Visit: Payer: Self-pay | Admitting: Hematology

## 2019-11-10 DIAGNOSIS — R0782 Intercostal pain: Secondary | ICD-10-CM

## 2019-11-11 ENCOUNTER — Other Ambulatory Visit: Payer: Self-pay | Admitting: Internal Medicine

## 2019-11-14 ENCOUNTER — Encounter: Payer: Self-pay | Admitting: Hematology

## 2019-11-15 ENCOUNTER — Other Ambulatory Visit: Payer: Self-pay | Admitting: Family Medicine

## 2019-11-16 ENCOUNTER — Encounter: Payer: Self-pay | Admitting: Hematology

## 2019-11-16 ENCOUNTER — Encounter: Payer: Self-pay | Admitting: Family Medicine

## 2019-11-18 ENCOUNTER — Encounter: Payer: Self-pay | Admitting: Hematology

## 2019-11-23 ENCOUNTER — Encounter: Payer: Self-pay | Admitting: Hematology

## 2019-11-24 ENCOUNTER — Other Ambulatory Visit: Payer: Self-pay

## 2019-11-24 ENCOUNTER — Encounter (HOSPITAL_COMMUNITY)
Admission: RE | Admit: 2019-11-24 | Discharge: 2019-11-24 | Disposition: A | Discharge status: Home / Self Care | Payer: PPO | Source: Ambulatory Visit | Attending: Hematology | Admitting: Hematology

## 2019-11-24 ENCOUNTER — Ambulatory Visit (HOSPITAL_COMMUNITY)
Admission: RE | Admit: 2019-11-24 | Discharge: 2019-11-24 | Disposition: A | Discharge status: Home / Self Care | Payer: PPO | Source: Ambulatory Visit | Attending: Hematology | Admitting: Hematology

## 2019-11-24 DIAGNOSIS — R0782 Intercostal pain: Secondary | ICD-10-CM | POA: Diagnosis not present

## 2019-11-24 DIAGNOSIS — R0789 Other chest pain: Secondary | ICD-10-CM | POA: Diagnosis not present

## 2019-11-24 MED ORDER — TECHNETIUM TC 99M MEDRONATE IV KIT
18.1000 | PACK | Freq: Once | INTRAVENOUS | Status: AC
  Administered 2019-11-24: 18.1 via INTRAVENOUS

## 2019-11-25 ENCOUNTER — Encounter: Payer: Self-pay | Admitting: Hematology

## 2019-11-27 ENCOUNTER — Encounter: Payer: Self-pay | Admitting: Hematology

## 2019-11-29 ENCOUNTER — Encounter: Payer: Self-pay | Admitting: Hematology

## 2019-11-30 DIAGNOSIS — M545 Low back pain: Secondary | ICD-10-CM | POA: Diagnosis not present

## 2019-11-30 DIAGNOSIS — M1611 Unilateral primary osteoarthritis, right hip: Secondary | ICD-10-CM | POA: Diagnosis not present

## 2019-11-30 DIAGNOSIS — M25551 Pain in right hip: Secondary | ICD-10-CM | POA: Diagnosis not present

## 2019-12-01 ENCOUNTER — Encounter: Payer: Self-pay | Admitting: Family Medicine

## 2019-12-07 NOTE — Progress Notes (Signed)
[place]   [phone] [phone]   Clinic Follow up Note   Patient Care Team: [name], MD as PCP - General (Family Medicine) Fay Records, MD as PCP - Cardiology (Cardiology) Paralee Cancel, MD as Consulting Physician (Orthopedic Surgery) Gatha Mayer, MD as Consulting Physician (Gastroenterology) Clent Jacks, MD as Consulting Physician (Ophthalmology) Roney Jaffe, DDS (Oral Surgery) Everlene Other (Dentistry) Haverstock, Jennefer Bravo, MD as Referring Physician (Dermatology) Dillingham, Loel Lofty, DO as Attending Physician (Plastic Surgery) Truitt Merle, MD as Consulting Physician (Hematology)  Date of Service:  12/11/2019  CHIEF COMPLAINT:  F/u of recurrent right breast cancer  SUMMARY OF ONCOLOGIC HISTORY: Oncology History  Breast cancer, right breast ([place])  01/2000 Initial Diagnosis   Breast cancer, right breast ([place])  History of primary lobular right breast carcinoma. 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 I believe that she had local radiation, tho that information is not available in this EMR.   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 2008 (tho patient did not agree to surgery until July 2008), invasive lobular carcinoma ER 81%, PR 96%, Her 2 1+ (PM08-99) , 1.5 cm invasive lobular with 3 axillary nodes negative, LVSI and perineural invasion present [id number]). She subsequently declined adjuvant systemic treatment . She had a complicated course following right mastectomy, with difficult healing after right breast reconstruction.      07/2009 Relapse/Recurrence   She had skin recurrence lateral right chest wall excised in Sept 2010 then treated with xeloda from Oct 2010 thru Feb 2011 and began on Arimidex July 2011; she may have stopped Arimidex after bone density scan 02-2014, or possibly the year prior (?).    2015 Imaging   Bone density scan [facility] 02-15-14 still osteopenic range, slightly lower in LS compared with 2013 and stable in hips.      CURRENT THERAPY:  Surveillance  INTERVAL HISTORY:  Anne [name] is here for a follow up. She presents to the clinic with her husband. She notes she notes her main pain is right clavicle without movement and her right shoulder when she moves. She has Dr Alvan Dame her Network engineer. He recommend OTC pain meds. She has not tried to reach her PCP. She notes having a Left cervical LN that she can feel. She notes she recently had skin reaction to Rogain and wonders could this be related.    REVIEW OF SYSTEMS:   Constitutional: Denies fevers, chills or abnormal weight loss Eyes: Denies blurriness of vision Ears, nose, mouth, throat, and face: Denies mucositis or sore throat Respiratory: Denies cough, dyspnea or wheezes Cardiovascular: Denies palpitation, chest discomfort or lower extremity swelling Gastrointestinal:  Denies nausea, heartburn or change in bowel habits Skin: Denies abnormal skin rashes MSK: (+) Right clavicle and shoulder pain with limited ROM.  Lymphatics: Denies new lymphadenopathy or easy bruising (+) Left cervical LN palpable.  Neurological:Denies numbness, tingling or new weaknesses Behavioral/Psych: Mood is stable, no new changes  All other systems were reviewed with the patient and are negative.  MEDICAL HISTORY:  Past Medical History:  Diagnosis Date  . ALKALINE PHOSPHATASE, ELEVATED 03/15/2009  .  Allergic state 06/10/2012  . Allergy   . Anemia   . Anxiety and depression 04/28/2011  . Arthritis   . Asthma   . Atypical chest pain 11/30/2011  . AVM  (arteriovenous malformation) of colon 2011   cecum  . Baker's cyst of knee 05/22/2011  . Cancer ([place]) 01,  08   XRT/chemo 01-02/ lobular invasive ca  . Carotid artery disease ([place])    a. Carotid duplex 03/2014: stable 1-39% BICA, f/u due 03/2016.  [name] Chronic alcoholism in remission ([place]) 03/29/2011   Did not tolerate Klonopin, caused some confusion and bad dreams.    . Clotting disorder ([place])   . COPD (chronic obstructive pulmonary disease) ([place])   . Dermatitis 11/23/2012  . EE (eosinophilic esophagitis)   . Emphysema of lung ([place])   . Esophageal ring   . ESOPHAGEAL STRICTURE 03/29/2009  . Fall 11/23/2012  . Family history of breast cancer   . Family history of colon cancer   . Family history of ovarian cancer   . Family history of pancreatic cancer   . Folliculitis of nose [id number]  . GERD (gastroesophageal reflux disease) 09/29/2009   improved s/p cholecystectomy and esophagus dilatation  . History of chicken pox   . History of measles   . History of shingles    2 episodes  . Hx of echocardiogram    a. Echo 01/2013: mild LVH, EF 55-60%, normal wall motion, Gr 1 diast dysfn  . Hyperlipidemia   . Knee pain, bilateral 07/23/2011  . Medicare annual wellness visit, subsequent 06/19/2015  . Mixed hyperlipidemia 10/17/2010   Qualifier: Diagnosis of  By: Mack Guise    . Orthostasis   . Osteopenia 03/14/2011  . Osteoporosis   . Personal history of chemotherapy 2001  . Personal history of radiation therapy 2001   rt breast  . 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 PVC's (premature ventricular contractions)    a. Event monitor 01/2013: NSR, extensive PVCs.  . Radial neck fracture 10/2011   minimally displaced  . Substance abuse ([place])    In remission 3 years.   . Urinary incontinence 03/19/2012  . Vaginitis 05/22/2011    SURGICAL HISTORY: Past Surgical History:  Procedure Laterality Date  . APPENDECTOMY    . AUGMENTATION MAMMAPLASTY Right 05/27/2007  . BREAST BIOPSY Right  01/02/2007   wire loc  . BREAST BIOPSY  12/26/2006  . BREAST LUMPECTOMY Right 2001  . BREAST RECONSTRUCTION  2008, 2009, 2010  . BREAST REDUCTION WITH MASTOPEXY Left 05/30/2017   Procedure: LEFT BREAST REDUCTION FOR SYMTERY WITH MASTOPEXY;  Surgeon: Wallace Going, DO;  Location: [facility];  Service: Plastics;  Laterality: Left;  . CHOLECYSTECTOMY  2010  . COLONOSCOPY  09/05/10   cecal avm's  . DENTAL SURGERY  05/2016   4 dental implants by Dr. Loyal Gambler.  [name] ERCP  2010    CBD stone extraction   . ESOPHAGOGASTRODUODENOSCOPY  01/08/2012   Procedure: ESOPHAGOGASTRODUODENOSCOPY (EGD);  Surgeon: Gatha Mayer, MD;  Location: [facility];  Service: Endoscopy;  Laterality: N/A;  . ESOPHAGOGASTRODUODENOSCOPY (EGD) WITH ESOPHAGEAL DILATION  2010, 2012  . LAPAROSCOPIC APPENDECTOMY N/A 08/04/2016   Procedure: APPENDECTOMY LAPAROSCOPIC;  Surgeon: Michael Boston, MD;  Location: [facility];  Service: General;  Laterality: N/A;  . LIPOSUCTION WITH LIPOFILLING Left 11/21/2017   Procedure: LIPOSUCTION FROM ABDOMEN WITH LIPOFILLING TO LEFT BREAST;  Surgeon: Wallace Going, DO;  Location: [facility];  Service: Plastics;  Laterality: Left;  [name] MASTECTOMY  MODIFIED RADICAL Right 05/27/2007   , Mastectomy modified radical (08), breast reconstruction, CA lesions excised lateral abd wall 2010  . MASTOPEXY Left 11/21/2017   Procedure: LEFT BREAST REVISION MASTOPEXY FOR SYMMETRY;  Surgeon: Wallace Going, DO;  Location: [facility];  Service: Plastics;  Laterality: Left;  . REDUCTION MAMMAPLASTY Left   . SAVORY DILATION  01/08/2012   Procedure: SAVORY DILATION;  Surgeon: Gatha Mayer, MD;  Location: [facility];  Service: Endoscopy;  Laterality: N/A;  need xray    I have reviewed the social history and family history with the patient and they are unchanged from previous note.  ALLERGIES:  is allergic to codeine; morphine; peanut-containing drug products;  sorbitol; tomato; zofran; advil [ibuprofen]; diphenhydramine hcl; and oxycodone.  MEDICATIONS:  Current Outpatient Medications  Medication Sig Dispense Refill  . acetaminophen (TYLENOL) 500 MG tablet Take 500-1,000 mg by mouth every 6 (six) hours as needed (for pain.).    [name] amitriptyline (ELAVIL) 25 MG tablet TAKE 6 TABLETS(150 MG) BY MOUTH AT BEDTIME 180 tablet 1  . atorvastatin (LIPITOR) 20 MG tablet Take 1 tablet (20 mg total) by mouth 2 (two) times daily. 180 tablet 3  . Biotin (BIOTIN 5000) 5 MG CAPS Take by mouth.    . Cholecalciferol (VITAMIN D3) 1000 UNITS CAPS Take 2,000 Units by mouth daily.     . Cyanocobalamin (VITAMIN B 12 PO) Take 1 tablet by mouth daily.    [name] diltiazem (CARDIZEM) 30 MG tablet TAKE 1 TABLET BY MOUTH EVERY DAY FOR PALPITATIONS 90 tablet 0  . Ferrous Fumarate-Folic Acid 99991111 MG TABS Take 1 tablet by mouth daily. 30 each 3  . loratadine (CLARITIN) 10 MG tablet Take 1 tablet (10 mg total) by mouth daily. 30 tablet 11  . LORazepam (ATIVAN) 1 MG tablet TAKE 1 TABLET(1 MG) BY MOUTH EVERY 8 HOURS AS NEEDED FOR ANXIETY 70 tablet 1  . pantoprazole (PROTONIX) 40 MG tablet TAKE 1 TABLET(40 MG) BY MOUTH DAILY 90 tablet 1  . permethrin (ELIMITE) 5 % cream Apply from scalp to toes, avoid face. Leave on 8-14 hours. Thoroughly rinse with soap and water. Repeat in 2 weeks if needed. 60 g 1  . triamcinolone cream (KENALOG) 0.1 % Apply topically 2 (two) times daily. Apply to affected area 45 g 1   No current facility-administered medications for this visit.    PHYSICAL EXAMINATION: ECOG PERFORMANCE STATUS: 1 - Symptomatic but completely ambulatory  Vitals:   12/11/19 1049  BP: 130/85  Pulse: 97  Resp: 18  Temp: 98.3 F (36.8 C)  SpO2: 97%   Filed Weights   12/11/19 1049  Weight: 136 lb 4.8 oz (61.8 kg)    GENERAL:alert, no distress and comfortable SKIN: skin color, texture, turgor are normal, no rashes or significant lesions EYES: normal, Conjunctiva are pink and  non-injected, sclera clear  NECK: supple, thyroid normal size, non-tender, without nodularity  LYMPH:  no palpable lymphadenopathy in the axillary (+) Tiny left lateral cervical LN LUNGS: clear to auscultation and percussion with normal breathing effort HEART: regular rate & rhythm and no murmurs and no lower extremity edema ABDOMEN:abdomen soft, non-tender and normal bowel sounds Musculoskeletal:no cyanosis of digits and no clubbing (+) Tenderness of superior-medial clavicle and sternoclavicular joint NEURO: alert & oriented x 3 with fluent speech, no focal motor/sensory deficits BREAST: S/p right mastectomy with implant in place. No palpable mass, nodules or adenopathy bilaterally. Breast exam benign.   LABORATORY DATA:  I have reviewed the data  as listed CBC Latest Ref Rng & Units 12/11/2019 09/14/2019 06/10/2019  WBC 4.0 - 10.5 K/uL 5.5 6.2 6.7  Hemoglobin 12.0 - 15.0 g/dL 12.1 12.7 12.3  Hematocrit 36.0 - 46.0 % 37.4 39.3 38.0  Platelets 150 - 400 K/uL 281 270 255     CMP Latest Ref Rng & Units 12/11/2019 08/06/2019 06/10/2019  Glucose 70 - 99 mg/dL 98 88 96  BUN 8 - 23 mg/dL 16 19 21   Creatinine 0.44 - 1.00 mg/dL 0.74 0.63 0.77  Sodium 135 - 145 mmol/L 143 140 140  Potassium 3.5 - 5.1 mmol/L 3.8 4.5 3.9  Chloride 98 - 111 mmol/L 105 100 105  CO2 22 - 32 mmol/L 30 31 27   Calcium 8.9 - 10.3 mg/dL 9.3 9.9 9.7  Total Protein 6.5 - 8.1 g/dL 7.0 7.0 7.3  Total Bilirubin 0.3 - 1.2 mg/dL 0.4 0.3 0.4  Alkaline Phos 38 - 126 U/L 94 96 90  AST 15 - 41 U/L 18 19 17   ALT 0 - 44 U/L 21 15 17       RADIOGRAPHIC STUDIES: I have personally reviewed the radiological images as listed and agreed with the findings in the report. No results found.   ASSESSMENT & PLAN:  Anne [name] is a 79 y.o. female with   1. Right shoulder and clavicular pain  -In late 2020 she had has superior-medial clavicular pain that is now constant and flares. She also has right shoulder pain with limited ROM form  this. -She has seen Dr. Alvan Dame her ortho surgeon. He recommend OTC pain medication. Pt does not want NSAIDs due to her gastric issue  -Her 11/24/19 Bone scan showed No definite evidence of osseous metastatic disease and Probable degenerative changes in the right sternoclavicular joint and bilateral knees.   -Her pain has not improved in the last 1-2 months. As this is likely musculoskeletal related I discussed option of PT or Dry-needle therapy. I encouraged her to f/u with her PCP about this going forward. She is interested in PT, I will send referral.  -She can continue Tylenol and heat pad for pain.    2. History of recurrent right breast lobular carcinoma, in 2001, 2008 and chest wall recurrence in 2010 -She was initially diagnosed in 01/2000 but unfortunately hadrecurrencetwice, the last time involvingthe chest wall in 2010.  -At that time she was treated with Xeloda and Exemestane 2011-2015.She is now on surveillance. -She has been anxious about another breast cancer or recurrence. She is interested in left mastectomy. I previously discussed given her age and comorbiditiesI do not recommendmastectomy and it is not medically necessary. We will proceed with MRI breast and Mammograms yearly.  -From a breast cancer standpoint she is clinically doing well. Labs reviewed, CBC and CMP WNL. Iron panel is still pending. Her 05/1009 Mammogram and 10/2019 MRI was normal. Physical exam is unremarkable except mild left cervical LN enlargement and stable clavicular tenderness. There is no clinical concern for recurrence.  -Continue surveillance. Next mammogram in 05/2020, next MRI in 10/2020   3. Left lateral Cervical LN with mild enlargement -She has tiny palpable left lateral LN on exam today (12/10/18) -She notes she recently had skin reaction to Rogain and wonders could this be related.  -Given small size we will monitor. I encouraged her to watch for increased size or more LNs.    4. Radiation  Pulmonary Fibrosis, COPD, Emphysema -She continues to f/u with Pulmonologist   5. Neuropathy of soles of feet  -She  notes this has been ongoing for some time but has recently become constant and tingling increased. She denies burning pain -I discussed Gabapentin can help her tingling. She will consider it if tingling worsens.It's tolerable now -She is a vegetarian but takes Oral B12. Her B12 level is normal.  -She was scheduled for virtual visit with Dr. Mickeal Skinner today but cancelled as she came in today. She will wait to reschedule.     PLAN: -send referral to PT for he right shoulder pain -Lab and F/u in 3-4 months    No problem-specific Assessment & Plan notes found for this encounter.   Orders Placed This Encounter  Procedures  . Ambulatory referral to Physical Therapy    Referral Priority:   Routine    Referral Type:   Physical Medicine    Referral Reason:   Specialty Services Required    Requested Specialty:   Physical Therapy    Number of Visits Requested:   1   All questions were answered. The patient knows to call the clinic with any problems, questions or concerns. No barriers to learning was detected. The total time spent in the appointment was 30 minutes.     Truitt Merle, MD 12/11/2019   I, Joslyn Devon, am acting as scribe for Truitt Merle, MD.   I have reviewed the above documentation for accuracy and completeness, and I agree with the above.

## 2019-12-09 ENCOUNTER — Other Ambulatory Visit: Payer: Self-pay | Admitting: Family Medicine

## 2019-12-09 DIAGNOSIS — S299XXA Unspecified injury of thorax, initial encounter: Secondary | ICD-10-CM

## 2019-12-09 NOTE — Telephone Encounter (Signed)
Last written: 07/17/19 Last ov: 08/06/19 Next ov: none Contract: 12/23/17 UDS: 12/23/17

## 2019-12-11 ENCOUNTER — Encounter: Payer: Self-pay | Admitting: Hematology

## 2019-12-11 ENCOUNTER — Ambulatory Visit: Payer: PPO | Admitting: Hematology

## 2019-12-11 ENCOUNTER — Inpatient Hospital Stay: Payer: PPO | Attending: Hematology

## 2019-12-11 ENCOUNTER — Other Ambulatory Visit: Payer: Self-pay

## 2019-12-11 ENCOUNTER — Other Ambulatory Visit: Payer: PPO

## 2019-12-11 ENCOUNTER — Inpatient Hospital Stay (HOSPITAL_BASED_OUTPATIENT_CLINIC_OR_DEPARTMENT_OTHER): Payer: PPO | Admitting: Hematology

## 2019-12-11 VITALS — BP 130/85 | HR 97 | Temp 98.3°F | Resp 18 | Ht 65.5 in | Wt 136.3 lb

## 2019-12-11 DIAGNOSIS — G629 Polyneuropathy, unspecified: Secondary | ICD-10-CM | POA: Diagnosis not present

## 2019-12-11 DIAGNOSIS — J449 Chronic obstructive pulmonary disease, unspecified: Secondary | ICD-10-CM | POA: Diagnosis not present

## 2019-12-11 DIAGNOSIS — Y842 Radiological procedure and radiotherapy as the cause of abnormal reaction of the patient, or of later complication, without mention of misadventure at the time of the procedure: Secondary | ICD-10-CM | POA: Insufficient documentation

## 2019-12-11 DIAGNOSIS — Z79899 Other long term (current) drug therapy: Secondary | ICD-10-CM | POA: Diagnosis not present

## 2019-12-11 DIAGNOSIS — R590 Localized enlarged lymph nodes: Secondary | ICD-10-CM | POA: Diagnosis not present

## 2019-12-11 DIAGNOSIS — Z853 Personal history of malignant neoplasm of breast: Secondary | ICD-10-CM | POA: Insufficient documentation

## 2019-12-11 DIAGNOSIS — D649 Anemia, unspecified: Secondary | ICD-10-CM

## 2019-12-11 DIAGNOSIS — M25511 Pain in right shoulder: Secondary | ICD-10-CM | POA: Insufficient documentation

## 2019-12-11 DIAGNOSIS — Z923 Personal history of irradiation: Secondary | ICD-10-CM | POA: Insufficient documentation

## 2019-12-11 DIAGNOSIS — Z170 Estrogen receptor positive status [ER+]: Secondary | ICD-10-CM | POA: Diagnosis not present

## 2019-12-11 LAB — CBC WITH DIFFERENTIAL (CANCER CENTER ONLY)
Abs Immature Granulocytes: 0.01 10*3/uL (ref 0.00–0.07)
Basophils Absolute: 0.1 10*3/uL (ref 0.0–0.1)
Basophils Relative: 1 %
Eosinophils Absolute: 0.1 10*3/uL (ref 0.0–0.5)
Eosinophils Relative: 3 %
HCT: 37.4 % (ref 36.0–46.0)
Hemoglobin: 12.1 g/dL (ref 12.0–15.0)
Immature Granulocytes: 0 %
Lymphocytes Relative: 25 %
Lymphs Abs: 1.4 10*3/uL (ref 0.7–4.0)
MCH: 30.6 pg (ref 26.0–34.0)
MCHC: 32.4 g/dL (ref 30.0–36.0)
MCV: 94.7 fL (ref 80.0–100.0)
Monocytes Absolute: 0.5 10*3/uL (ref 0.1–1.0)
Monocytes Relative: 8 %
Neutro Abs: 3.5 10*3/uL (ref 1.7–7.7)
Neutrophils Relative %: 63 %
Platelet Count: 281 10*3/uL (ref 150–400)
RBC: 3.95 MIL/uL (ref 3.87–5.11)
RDW: 13.5 % (ref 11.5–15.5)
WBC Count: 5.5 10*3/uL (ref 4.0–10.5)
nRBC: 0 % (ref 0.0–0.2)

## 2019-12-11 LAB — CMP (CANCER CENTER ONLY)
ALT: 21 U/L (ref 0–44)
AST: 18 U/L (ref 15–41)
Albumin: 3.8 g/dL (ref 3.5–5.0)
Alkaline Phosphatase: 94 U/L (ref 38–126)
Anion gap: 8 (ref 5–15)
BUN: 16 mg/dL (ref 8–23)
CO2: 30 mmol/L (ref 22–32)
Calcium: 9.3 mg/dL (ref 8.9–10.3)
Chloride: 105 mmol/L (ref 98–111)
Creatinine: 0.74 mg/dL (ref 0.44–1.00)
GFR, Est AFR Am: >60 mL/min (ref >60)
GFR, Estimated: >60 mL/min (ref >60)
Glucose, Bld: 98 mg/dL (ref 70–99)
Potassium: 3.8 mmol/L (ref 3.5–5.1)
Sodium: 143 mmol/L (ref 135–145)
Total Bilirubin: 0.4 mg/dL (ref 0.3–1.2)
Total Protein: 7 g/dL (ref 6.5–8.1)

## 2019-12-11 LAB — FERRITIN: Ferritin: 42 ng/mL (ref 11–307)

## 2019-12-11 LAB — IRON AND TIBC
Iron: 55 ug/dL (ref 41–142)
Saturation Ratios: 19 % — ABNORMAL LOW (ref 21–57)
TIBC: 294 ug/dL (ref 236–444)
UIBC: 239 ug/dL (ref 120–384)

## 2019-12-18 LAB — METHYLMALONIC ACID, SERUM: Methylmalonic Acid, Quantitative: 209 nmol/L (ref 0–378)

## 2019-12-21 ENCOUNTER — Other Ambulatory Visit: Payer: Self-pay | Admitting: Orthopedic Surgery

## 2019-12-21 DIAGNOSIS — M25551 Pain in right hip: Secondary | ICD-10-CM

## 2019-12-23 ENCOUNTER — Ambulatory Visit
Admission: RE | Admit: 2019-12-23 | Discharge: 2019-12-23 | Disposition: A | Discharge status: Home / Self Care | Payer: PPO | Source: Ambulatory Visit | Attending: Orthopedic Surgery | Admitting: Orthopedic Surgery

## 2019-12-23 ENCOUNTER — Other Ambulatory Visit: Payer: Self-pay

## 2019-12-23 DIAGNOSIS — M25551 Pain in right hip: Secondary | ICD-10-CM

## 2019-12-23 MED ORDER — IOPAMIDOL (ISOVUE-M 200) INJECTION 41%
1.0000 mL | Freq: Once | INTRAMUSCULAR | Status: AC
  Administered 2019-12-23: 1 mL via INTRA_ARTICULAR

## 2019-12-23 MED ORDER — METHYLPREDNISOLONE ACETATE 40 MG/ML INJ SUSP (RADIOLOG
120.0000 mg | Freq: Once | INTRAMUSCULAR | Status: AC
  Administered 2019-12-23: 120 mg via INTRA_ARTICULAR

## 2019-12-29 ENCOUNTER — Encounter: Payer: Self-pay | Admitting: Family Medicine

## 2019-12-29 DIAGNOSIS — M25511 Pain in right shoulder: Secondary | ICD-10-CM | POA: Diagnosis not present

## 2019-12-29 NOTE — Telephone Encounter (Signed)
Can you add patients to the COVID-19 wait list  Please advise

## 2020-01-01 ENCOUNTER — Other Ambulatory Visit: Payer: Self-pay | Admitting: Family Medicine

## 2020-01-10 ENCOUNTER — Ambulatory Visit: Payer: PPO | Attending: Internal Medicine

## 2020-01-10 DIAGNOSIS — Z23 Encounter for immunization: Secondary | ICD-10-CM | POA: Insufficient documentation

## 2020-01-10 NOTE — Progress Notes (Signed)
   Covid-19 Vaccination Clinic  Name:  Anne [name]    MRN: [id number] DOB: 1941/05/03  01/10/2020  [name] was observed post Covid-19 immunization for 15 minutes without incidence. She was provided with Vaccine Information Sheet and instruction to access the V-Safe system.   Ms. Chew was instructed to call 911 with any severe reactions post vaccine: [name] Difficulty breathing  . Swelling of your face and throat  . A fast heartbeat  . A bad rash all over your body  . Dizziness and weakness    Immunizations Administered    Name Date Dose VIS Date Route   Pfizer COVID-19 Vaccine 01/10/2020  3:12 PM 0.3 mL 10/23/2019 Intramuscular   Manufacturer: [place]   Lot: [id number]   [place]: [id number]

## 2020-01-11 ENCOUNTER — Telehealth: Payer: Self-pay | Admitting: Family Medicine

## 2020-01-11 NOTE — Telephone Encounter (Signed)
Notify if severe and worsening may need to be seen at [facility] or ER but a great deal of swelling and pain is to be expected. Would apply ice, elevate and use Tylenol 1000 mg a couple times a day for pain relief. Can take up to 3000 mg in 24 hours as needed.

## 2020-01-11 NOTE — Telephone Encounter (Signed)
LM requesting call back.  

## 2020-01-11 NOTE — Telephone Encounter (Signed)
Please advise 

## 2020-01-11 NOTE — Telephone Encounter (Signed)
Patient states that she arm pain/swelling due to covid vaccine. Please advise .

## 2020-01-12 ENCOUNTER — Encounter: Payer: Self-pay | Admitting: Family Medicine

## 2020-01-12 ENCOUNTER — Other Ambulatory Visit: Payer: Self-pay | Admitting: Family Medicine

## 2020-01-19 ENCOUNTER — Encounter: Payer: Self-pay | Admitting: Family Medicine

## 2020-01-22 ENCOUNTER — Encounter: Payer: Self-pay | Admitting: Family Medicine

## 2020-02-09 ENCOUNTER — Ambulatory Visit: Payer: PPO | Attending: Internal Medicine

## 2020-02-09 DIAGNOSIS — Z23 Encounter for immunization: Secondary | ICD-10-CM

## 2020-02-09 NOTE — Progress Notes (Signed)
   Covid-19 Vaccination Clinic  Name:  Anne [name]    MRN: [id number] DOB: 1940/12/28  02/09/2020  [name] was observed post Covid-19 immunization for 15 minutes without incident. She was provided with Vaccine Information Sheet and instruction to access the V-Safe system.   [name] was instructed to call 911 with any severe reactions post vaccine: [name] Difficulty breathing  . Swelling of face and throat  . A fast heartbeat  . A bad rash all over body  . Dizziness and weakness   Immunizations Administered    Name Date Dose VIS Date Route   Pfizer COVID-19 Vaccine 02/09/2020  3:15 PM 0.3 mL 10/23/2019 Intramuscular   Manufacturer: [employer]   Lot: [id number]   [place]: [id number]

## 2020-02-19 ENCOUNTER — Encounter: Payer: Self-pay | Admitting: Hematology

## 2020-02-20 ENCOUNTER — Other Ambulatory Visit: Payer: Self-pay | Admitting: Internal Medicine

## 2020-02-23 ENCOUNTER — Other Ambulatory Visit: Payer: Self-pay

## 2020-02-23 ENCOUNTER — Ambulatory Visit (INDEPENDENT_AMBULATORY_CARE_PROVIDER_SITE_OTHER): Payer: PPO | Admitting: Family Medicine

## 2020-02-23 VITALS — BP 106/68 | HR 101 | Temp 98.5°F | Resp 12 | Ht 65.5 in | Wt 136.4 lb

## 2020-02-23 DIAGNOSIS — G2581 Restless legs syndrome: Secondary | ICD-10-CM

## 2020-02-23 DIAGNOSIS — R739 Hyperglycemia, unspecified: Secondary | ICD-10-CM

## 2020-02-23 DIAGNOSIS — E519 Thiamine deficiency, unspecified: Secondary | ICD-10-CM

## 2020-02-23 DIAGNOSIS — R079 Chest pain, unspecified: Secondary | ICD-10-CM | POA: Diagnosis not present

## 2020-02-23 DIAGNOSIS — E876 Hypokalemia: Secondary | ICD-10-CM | POA: Diagnosis not present

## 2020-02-23 DIAGNOSIS — D649 Anemia, unspecified: Secondary | ICD-10-CM

## 2020-02-23 DIAGNOSIS — R700 Elevated erythrocyte sedimentation rate: Secondary | ICD-10-CM

## 2020-02-23 DIAGNOSIS — G629 Polyneuropathy, unspecified: Secondary | ICD-10-CM

## 2020-02-23 DIAGNOSIS — E782 Mixed hyperlipidemia: Secondary | ICD-10-CM | POA: Diagnosis not present

## 2020-02-23 DIAGNOSIS — Z0000 Encounter for general adult medical examination without abnormal findings: Secondary | ICD-10-CM | POA: Diagnosis not present

## 2020-02-23 LAB — LIPID PANEL
Cholesterol: 173 mg/dL (ref 0–200)
HDL: 54.5 mg/dL (ref >39.00)
LDL Cholesterol: 87 mg/dL (ref 0–99)
NonHDL: 118.57
Total CHOL/HDL Ratio: 3
Triglycerides: 160 mg/dL — ABNORMAL HIGH (ref 0.0–149.0)
VLDL: 32 mg/dL (ref 0.0–40.0)

## 2020-02-23 LAB — HEMOGLOBIN A1C: Hgb A1c MFr Bld: 5.4 % (ref 4.6–6.5)

## 2020-02-23 LAB — C-REACTIVE PROTEIN: CRP: <1 mg/dL (ref 0.5–20.0)

## 2020-02-23 LAB — SEDIMENTATION RATE: Sed Rate: 39 mm/hr — ABNORMAL HIGH (ref 0–30)

## 2020-02-23 LAB — TSH: TSH: 4 u[IU]/mL (ref 0.35–4.50)

## 2020-02-23 LAB — MAGNESIUM: Magnesium: 1.9 mg/dL (ref 1.5–2.5)

## 2020-02-23 MED ORDER — PRAMIPEXOLE DIHYDROCHLORIDE 0.125 MG PO TABS: 0.1250 mg | ORAL_TABLET | Freq: Two times a day (BID) | ORAL | 2 refills | Status: DC

## 2020-02-23 NOTE — Patient Instructions (Addendum)
Omron Blood Pressure cuff, upper arm, want BP 100-140/60-90 Pulse oximeter, want oxygen in 90s  Weekly vitals  Take Multivitamin with minerals, selenium Vitamin D 1000-2000 IU daily Probiotic with lactobacillus and bifidophilus, [employer] company Asprin EC 81 mg daily Fish oil or krill oil caps daily Melatonin 2-5 mg at bedtime, can go up to 20 mg   [email or web address]  Encouraged increased hydration and fiber in diet. Daily probiotics. If bowels not moving can use MOM 2 tbls po in 4 oz of warm prune juice by mouth every 2-3 days. If no results then repeat in 4 hours with  Dulcolax suppository pr, may repeat again in 4 more hours as needed. Seek care if symptoms worsen. Consider daily Miralax and/or Dulcolax if symptoms persist.   Miralax and Benfiber together once or twice a day

## 2020-02-24 DIAGNOSIS — G2581 Restless legs syndrome: Secondary | ICD-10-CM | POA: Insufficient documentation

## 2020-02-24 NOTE — Progress Notes (Signed)
Subjective:    Patient ID: Anne [name], female    DOB: 1941-08-01, 79 y.o.   MRN: [id number]  Chief Complaint  Patient presents with  . "vibration inside of body"    HPI Patient is in today for follow up on chronic medical concerns. No recent febrile illness or hospitalizations. She is accompanied by her husband. Her greatest concern is burning and tingling in her feet worse in the evening. That has been going on for quite some time but over the past month she has had a more generalized sense of vibration, tingling even pain under the skin worse in the the night. No other acute concerns. Denies CP/palp/SOB/HA/congestion/fevers/GI or GU c/o. Taking meds as prescribed  Past Medical History:  Diagnosis Date  . ALKALINE PHOSPHATASE, ELEVATED 03/15/2009  . Allergic state 06/10/2012  . Allergy   . Anemia   . Anxiety and depression 04/28/2011  . Arthritis   . Asthma   . Atypical chest pain 11/30/2011  . 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 Baker's cyst of knee 05/22/2011  . Cancer ([place]) 01,  08   XRT/chemo 01-02/ lobular invasive ca  . Carotid artery disease ([place])    a. Carotid duplex 03/2014: stable 1-39% BICA, f/u due 03/2016.  [name] Chronic alcoholism in remission ([place]) 03/29/2011   Did not tolerate Klonopin, caused some confusion and bad dreams.    . Clotting disorder ([place])   . COPD (chronic obstructive pulmonary disease) ([place])   . Dermatitis 11/23/2012  . EE (eosinophilic esophagitis)   . Emphysema of lung ([place])   . Esophageal ring   . ESOPHAGEAL STRICTURE 03/29/2009  . Fall 11/23/2012  . Family history of breast cancer   . Family history of colon cancer   . Family history of ovarian cancer   . Family history of pancreatic cancer   . Folliculitis of nose 2/69/4854  . GERD (gastroesophageal reflux disease) 09/29/2009   improved s/p cholecystectomy and esophagus dilatation  . History of chicken pox   . History of measles   . History of shingles    2 episodes    . Hx of echocardiogram    a. Echo 01/2013: mild LVH, EF 55-60%, normal wall motion, Gr 1 diast dysfn  . Hyperlipidemia   . Knee pain, bilateral 07/23/2011  . Medicare annual wellness visit, subsequent 06/19/2015  . Mixed hyperlipidemia 10/17/2010   Qualifier: Diagnosis of  By: Mack Guise    . Orthostasis   . Osteopenia 03/14/2011  . Osteoporosis   . Personal history of chemotherapy 2001  . Personal history of radiation therapy 2001   rt breast  . 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 PVC's (premature ventricular contractions)    a. Event monitor 01/2013: NSR, extensive PVCs.  . Radial neck fracture 10/2011   minimally displaced  . Substance abuse ([place])    In remission 3 years.   . Urinary incontinence 03/19/2012  . Vaginitis 05/22/2011    Past Surgical History:  Procedure Laterality Date  . APPENDECTOMY    . AUGMENTATION MAMMAPLASTY Right 05/27/2007  . BREAST BIOPSY Right 01/02/2007   wire loc  . BREAST BIOPSY  12/26/2006  . BREAST LUMPECTOMY Right 2001  . BREAST RECONSTRUCTION  2008, 2009, 2010  . BREAST REDUCTION WITH MASTOPEXY Left 05/30/2017   Procedure: LEFT BREAST REDUCTION FOR SYMTERY WITH MASTOPEXY;  Surgeon: Wallace Going, DO;  Location: [facility];  Service: Plastics;  Laterality: Left;  . CHOLECYSTECTOMY  2010  .  COLONOSCOPY  09/05/10   cecal avm's  . DENTAL SURGERY  05/2016   4 dental implants by Dr. Loyal Gambler.  [name] ERCP  2010    CBD stone extraction   . ESOPHAGOGASTRODUODENOSCOPY  01/08/2012   Procedure: ESOPHAGOGASTRODUODENOSCOPY (EGD);  Surgeon: Gatha Mayer, MD;  Location: [facility];  Service: Endoscopy;  Laterality: N/A;  . ESOPHAGOGASTRODUODENOSCOPY (EGD) WITH ESOPHAGEAL DILATION  2010, 2012  . LAPAROSCOPIC APPENDECTOMY N/A 08/04/2016   Procedure: APPENDECTOMY LAPAROSCOPIC;  Surgeon: Michael Boston, MD;  Location: [facility];  Service: General;  Laterality: N/A;  . LIPOSUCTION WITH LIPOFILLING Left 11/21/2017   Procedure: LIPOSUCTION FROM  ABDOMEN WITH LIPOFILLING TO LEFT BREAST;  Surgeon: Wallace Going, DO;  Location: [facility];  Service: Plastics;  Laterality: Left;  [name] MASTECTOMY MODIFIED RADICAL Right 05/27/2007   , Mastectomy modified radical (08), breast reconstruction, CA lesions excised lateral abd wall 2010  . MASTOPEXY Left 11/21/2017   Procedure: LEFT BREAST REVISION MASTOPEXY FOR SYMMETRY;  Surgeon: Wallace Going, DO;  Location: [facility];  Service: Plastics;  Laterality: Left;  . REDUCTION MAMMAPLASTY Left   . SAVORY DILATION  01/08/2012   Procedure: SAVORY DILATION;  Surgeon: Gatha Mayer, MD;  Location: [facility];  Service: Endoscopy;  Laterality: N/A;  need xray    Family History  Problem Relation Age of Onset  . Heart disease Father   . Lung cancer Father        smoker  . Cirrhosis Sister        Primary Biliary  . Stroke Maternal Grandmother   . Alcohol abuse Maternal Grandfather   . Heart disease Paternal Grandfather   . Esophageal cancer Paternal Grandfather   . Rectal cancer Paternal Grandfather   . Anxiety disorder Sister   . Osteoporosis Sister   . Arthritis Sister        Rheumatoid  . Osteoporosis Sister   . Skin cancer Sister        multiple skin cancers, over 67 excisions.  . Other Mother        tic [place]  . Breast cancer Maternal Aunt        dx in her 48s  . Breast cancer Paternal Aunt        dx in her 54s  . Pancreatic cancer Maternal Uncle        dx in his 1s; smoker  . Breast cancer Maternal Aunt        dx in her 81s  . Breast cancer Maternal Aunt        possible breast cancer dx and died in her 63s  . Ovarian cancer Maternal Aunt 29  . Pancreatic cancer Maternal Aunt        dx in her 63s  . Stomach cancer Maternal Uncle   . Colon cancer Maternal Uncle   . Lung cancer Paternal Aunt   . Breast cancer Cousin        paternal first cousin  . Breast cancer Cousin        maternal first cousin  . Anesthesia problems Neg Hx     . Hypotension Neg Hx   . Malignant hyperthermia Neg Hx   . Pseudochol deficiency Neg Hx     Social History   Socioeconomic History  . Marital status: Married    Spouse name: Sam  . Number of children: 3  . Years of education: Not on file  . Highest education level: Not on file  Occupational History  .  Occupation: Social worker  Tobacco Use  . Smoking status: Former Smoker    Packs/day: 1.00    Years: 50.00    Pack years: 50.00    Types: Cigarettes    Quit date: 05/22/2007    Years since quitting: 12.7  . Smokeless tobacco: Never Used  Substance and Sexual Activity  . Alcohol use: Not Currently  . Drug use: No  . Sexual activity: Yes    Partners: Male    Comment: lives with husband,   Other Topics Concern  . Not on file  Social History Narrative  . Not on file   Social Determinants of Health   Financial Resource Strain:   . Difficulty of Paying Living Expenses:   Food Insecurity:   . Worried About Charity fundraiser in the Last Year:   . Arboriculturist in the Last Year:   Transportation Needs:   . Film/video editor (Medical):   [name] Lack of Transportation (Non-Medical):   Physical Activity:   . Days of Exercise per Week:   . Minutes of Exercise per Session:   Stress:   . Feeling of Stress :   Social Connections:   . Frequency of Communication with Friends and Family:   . Frequency of Social Gatherings with Friends and Family:   . Attends Religious Services:   . Active Member of Clubs or Organizations:   . Attends Archivist Meetings:   [name] Marital Status:   Intimate Partner Violence:   . Fear of Current or Ex-Partner:   . Emotionally Abused:   [name] Physically Abused:   . Sexually Abused:     Outpatient Medications Prior to Visit  Medication Sig Dispense Refill  . acetaminophen (TYLENOL) 500 MG tablet Take 500-1,000 mg by mouth every 6 (six) hours as needed (for pain.).    [name] amitriptyline (ELAVIL) 25 MG tablet TAKE 6 TABLETS(150 MG) BY MOUTH AT  BEDTIME 180 tablet 1  . atorvastatin (LIPITOR) 20 MG tablet Take 1 tablet (20 mg total) by mouth 2 (two) times daily. 180 tablet 3  . Biotin (BIOTIN 5000) 5 MG CAPS Take by mouth.    . Cholecalciferol (VITAMIN D3) 1000 UNITS CAPS Take 2,000 Units by mouth daily.     . Cyanocobalamin (VITAMIN B 12 PO) Take 1 tablet by mouth daily.    [name] diltiazem (CARDIZEM) 30 MG tablet TAKE 1 TABLET BY MOUTH EVERY DAY FOR PALPITATIONS 90 tablet 2  . Ferrous Fumarate-Folic Acid 045-4 MG TABS Take 1 tablet by mouth daily. 30 each 3  . loratadine (CLARITIN) 10 MG tablet Take 1 tablet (10 mg total) by mouth daily. 30 tablet 11  . LORazepam (ATIVAN) 1 MG tablet TAKE 1 TABLET(1 MG) BY MOUTH EVERY 8 HOURS AS NEEDED FOR ANXIETY 70 tablet 1  . pantoprazole (PROTONIX) 40 MG tablet TAKE 1 TABLET(40 MG) BY MOUTH DAILY 90 tablet 1  . pantoprazole (PROTONIX) 40 MG tablet TAKE 1 TABLET(40 MG) BY MOUTH DAILY 90 tablet 1  . permethrin (ELIMITE) 5 % cream Apply from scalp to toes, avoid face. Leave on 8-14 hours. Thoroughly rinse with soap and water. Repeat in 2 weeks if needed. 60 g 1  . triamcinolone cream (KENALOG) 0.1 % Apply topically 2 (two) times daily. Apply to affected area 45 g 1   No facility-administered medications prior to visit.    Allergies  Allergen Reactions  . Codeine Other (See Comments)    "flu like symptoms"  . Morphine Other (See Comments)    "  flu like symptoms"  . Peanut-Containing Drug Products Hives  . Sorbitol Other (See Comments)    GI Issues  . Tomato Diarrhea  . Zofran Other (See Comments)    headache  . Advil [Ibuprofen] Other (See Comments)    Irritates throat.  . Diphenhydramine Hcl Other (See Comments)    "nervous and upset"  . Oxycodone Anxiety    Confusion with inability to think clearly    Review of Systems  Constitutional: Positive for malaise/fatigue. Negative for fever.  HENT: Negative for congestion.   Eyes: Negative for blurred vision.  Respiratory: Negative for  shortness of breath.   Cardiovascular: Negative for chest pain, palpitations and leg swelling.  Gastrointestinal: Negative for abdominal pain, blood in stool and nausea.  Genitourinary: Negative for dysuria and frequency.  Musculoskeletal: Positive for myalgias. Negative for falls.  Skin: Negative for rash.  Neurological: Positive for tingling. Negative for dizziness, loss of consciousness and headaches.  Endo/Heme/Allergies: Negative for environmental allergies.  Psychiatric/Behavioral: Negative for depression. The patient is not nervous/anxious.        Objective:    Physical Exam Vitals and nursing note reviewed.  Constitutional:      General: She is not in acute distress.    Appearance: She is well-developed.  HENT:     Head: Normocephalic and atraumatic.     Nose: Nose normal.  Eyes:     General:        Right eye: No discharge.        Left eye: No discharge.  Cardiovascular:     Rate and Rhythm: Normal rate and regular rhythm.     Heart sounds: No murmur.  Pulmonary:     Effort: Pulmonary effort is normal.     Breath sounds: Normal breath sounds.  Abdominal:     General: Bowel sounds are normal.     Palpations: Abdomen is soft.     Tenderness: There is no abdominal tenderness.  Musculoskeletal:     Cervical back: Normal range of motion and neck supple.  Skin:    General: Skin is warm and dry.  Neurological:     Mental Status: She is alert and oriented to person, place, and time.     BP 106/68 (BP Location: Left Arm, Cuff Size: Normal)   Pulse (!) 101   Temp 98.5 F (36.9 C) (Temporal)   Resp 12   Ht 5' 5.5" (1.664 m)   Wt 136 lb 6.4 oz (61.9 kg)   SpO2 97%   BMI 22.35 kg/m  Wt Readings from Last 3 Encounters:  02/23/20 136 lb 6.4 oz (61.9 kg)  12/11/19 136 lb 4.8 oz (61.8 kg)  10/22/19 135 lb (61.2 kg)    Diabetic Foot Exam - Simple   No data filed     Lab Results  Component Value Date   WBC 5.5 12/11/2019   HGB 12.1 12/11/2019   HCT 37.4  12/11/2019   PLT 281 12/11/2019   GLUCOSE 98 12/11/2019   CHOL 173 02/23/2020   TRIG 160.0 (H) 02/23/2020   HDL 54.50 02/23/2020   LDLDIRECT 117.4 01/13/2013   LDLCALC 87 02/23/2020   ALT 21 12/11/2019   AST 18 12/11/2019   NA 143 12/11/2019   K 3.8 12/11/2019   CL 105 12/11/2019   CREATININE 0.74 12/11/2019   BUN 16 12/11/2019   CO2 30 12/11/2019   TSH 4.00 02/23/2020   INR 1.02 08/12/2017   HGBA1C 5.4 02/23/2020    Lab Results  Component Value Date  TSH 4.00 02/23/2020   Lab Results  Component Value Date   WBC 5.5 12/11/2019   HGB 12.1 12/11/2019   HCT 37.4 12/11/2019   MCV 94.7 12/11/2019   PLT 281 12/11/2019   Lab Results  Component Value Date   NA 143 12/11/2019   K 3.8 12/11/2019   CHLORIDE 104 09/04/2017   CO2 30 12/11/2019   GLUCOSE 98 12/11/2019   BUN 16 12/11/2019   CREATININE 0.74 12/11/2019   BILITOT 0.4 12/11/2019   ALKPHOS 94 12/11/2019   AST 18 12/11/2019   ALT 21 12/11/2019   PROT 7.0 12/11/2019   ALBUMIN 3.8 12/11/2019   CALCIUM 9.3 12/11/2019   ANIONGAP 8 12/11/2019   EGFR >60 09/04/2017   GFR 91.65 08/06/2019   Lab Results  Component Value Date   CHOL 173 02/23/2020   Lab Results  Component Value Date   HDL 54.50 02/23/2020   Lab Results  Component Value Date   LDLCALC 87 02/23/2020   Lab Results  Component Value Date   TRIG 160.0 (H) 02/23/2020   Lab Results  Component Value Date   CHOLHDL 3 02/23/2020   Lab Results  Component Value Date   HGBA1C 5.4 02/23/2020       Assessment & Plan:   Problem List Items Addressed This Visit    Mixed hyperlipidemia - Primary    Encouraged heart healthy diet, increase exercise, avoid trans fats, consider a krill oil cap daily. Tolerating statin      Relevant Orders   TSH (Completed)   Lipid panel (Completed)   Elevated sed rate   Relevant Orders   Sedimentation rate (Completed)   Anemia   Medicare annual wellness visit, subsequent   Pain in the chest   Hypokalemia    Hyperglycemia    hgba1c acceptable, minimize simple carbs. Increase exercise as tolerated.       Relevant Orders   Hemoglobin A1c (Completed)   TSH (Completed)   RLS (restless legs syndrome)    Hydrate well, check labs and start Mirapex, check labs as she does complain of an all over sensation of tingling, vibrating under her skin       Other Visit Diagnoses    Neuropathy       Relevant Orders   C-reactive protein (Completed)   Zinc   Magnesium (Completed)   Vitamin B1   Vitamin B6   Vitamin B3      I am having [place] R. Crespo "Torie" start on pramipexole. I am also having her maintain her Vitamin D3, Cyanocobalamin (VITAMIN B 12 PO), acetaminophen, loratadine, Ferrous Fumarate-Folic Acid, Biotin, triamcinolone cream, permethrin, atorvastatin, LORazepam, pantoprazole, pantoprazole, amitriptyline, and diltiazem.  Meds ordered this encounter  Medications  . pramipexole (MIRAPEX) 0.125 MG tablet    Sig: Take 1-2 tablets (0.125-0.25 mg total) by mouth 2 (two) times daily.    Dispense:  120 tablet    Refill:  2     Penni Homans, MD

## 2020-02-24 NOTE — Assessment & Plan Note (Addendum)
Hydrate well, check labs and start Mirapex, check labs as she does complain of an all over sensation of tingling, vibrating under her skin

## 2020-02-24 NOTE — Assessment & Plan Note (Signed)
Encouraged heart healthy diet, increase exercise, avoid trans fats, consider a krill oil cap daily. Tolerating statin 

## 2020-02-24 NOTE — Assessment & Plan Note (Signed)
hgba1c acceptable, minimize simple carbs. Increase exercise as tolerated.  

## 2020-02-25 ENCOUNTER — Encounter: Payer: Self-pay | Admitting: Family Medicine

## 2020-02-25 LAB — ZINC: Zinc: 75 ug/dL (ref 60–130)

## 2020-02-27 LAB — VITAMIN B3
Nicotinamide: 28 ng/mL
Nicotinic Acid: <20 ng/mL

## 2020-02-27 LAB — VITAMIN B1: Vitamin B1 (Thiamine): 7 nmol/L — ABNORMAL LOW (ref 8–30)

## 2020-02-27 LAB — VITAMIN B6: Vitamin B6: 18.3 ng/mL (ref 2.1–21.7)

## 2020-02-29 NOTE — Addendum Note (Signed)
Addended byDamita Dunnings D on: 02/29/2020 10:51 AM   Modules accepted: Orders

## 2020-03-04 ENCOUNTER — Encounter: Payer: Self-pay | Admitting: Family Medicine

## 2020-03-06 ENCOUNTER — Other Ambulatory Visit: Payer: Self-pay | Admitting: Family Medicine

## 2020-03-06 MED ORDER — ROPINIROLE HCL 0.25 MG PO TABS: 0.2500 mg | ORAL_TABLET | Freq: Every day | ORAL | 0 refills | Status: DC

## 2020-03-10 NOTE — Progress Notes (Signed)
[place]   [phone] [phone]   Clinic Follow up Note   Patient Care Team: [name], MD as PCP - General (Family Medicine) Fay Records, MD as PCP - Cardiology (Cardiology) Paralee Cancel, MD as Consulting Physician (Orthopedic Surgery) Gatha Mayer, MD as Consulting Physician (Gastroenterology) Clent Jacks, MD as Consulting Physician (Ophthalmology) Roney Jaffe, DDS (Oral Surgery) Everlene Other (Dentistry) Haverstock, Jennefer Bravo, MD as Referring Physician (Dermatology) Dillingham, Loel Lofty, DO as Attending Physician (Plastic Surgery) Truitt Merle, MD as Consulting Physician (Hematology)  Date of Service:  03/14/2020  CHIEF COMPLAINT: F/u of recurrent right breast cancer  SUMMARY OF ONCOLOGIC HISTORY: Oncology History  Breast cancer, right breast ([place])  01/2000 Initial Diagnosis   Breast cancer, right breast ([place])  History of primary lobular right breast carcinoma. 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 I believe that she had local radiation, tho that information is not available in this EMR.   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 2008 (tho patient did not agree to surgery until July 2008), invasive lobular carcinoma ER 81%, PR 96%, Her 2 1+ (PM08-99) , 1.5 cm invasive lobular with 3 axillary nodes negative, LVSI and perineural invasion present [id number]). She subsequently declined adjuvant systemic treatment . She had a complicated course following right mastectomy, with difficult healing after right breast reconstruction.     07/2009 Relapse/Recurrence   She had skin recurrence lateral right chest wall excised in Sept 2010 then treated with xeloda from Oct 2010 thru Feb 2011 and began on Arimidex July 2011; she may have stopped Arimidex after bone density scan 02-2014, or possibly the year prior (?).    2015 Imaging   Bone density scan [facility] 02-15-14 still osteopenic range, slightly lower in LS compared with 2013 and stable in hips.      CURRENT THERAPY:  Surveillance  INTERVAL HISTORY:  [name] is here for a follow up or right breast cancer. She presents to the clinic with her husband. She notes she did PT but only for 1 session due to pain. She notes her right shoulder pain is resolving on its own now. She notes spasms in her right upper lateral thigh and required 2 steroid injections in the past with little benefit. She notes muscle and joint stiffness in her anterior legs occasionally. Standing hurts the most and then she can walk it off. She notes last night her knee pain flared. She takes 1000mg  of calcium a day. She denies rectal bleeding. She plans to see neurologist again given her predisposition for seizures. She notes she had a fall last week and hit her head on coffee table but did not lose consciousness or injury. She is not sure why or how she fell.   She notes she had both her COVID19 vaccines.    REVIEW OF SYSTEMS:   Constitutional: Denies fevers, chills or abnormal weight loss Eyes: Denies blurriness of vision Ears, nose, mouth, throat, and face: Denies mucositis or sore throat Respiratory: Denies cough, dyspnea or wheezes Cardiovascular: Denies palpitation, chest discomfort or lower extremity swelling Gastrointestinal:  Denies nausea, heartburn or change  in bowel habits Skin: Denies abnormal skin rashes MSK: (+) Improving right shoulder pain (+) Spasms in right upper lateral thigh, intermittently. (+) joint and muscle stiffness of b/l legs  Lymphatics: Denies new lymphadenopathy or easy  bruising Neurological:Denies numbness, tingling or new weaknesses Behavioral/Psych: Mood is stable, no new changes (+) balance issues  All other systems were reviewed with the patient and are negative.  MEDICAL HISTORY:  Past Medical History:  Diagnosis Date  . ALKALINE PHOSPHATASE, ELEVATED 03/15/2009  . Allergic state 06/10/2012  . Allergy   . Anemia   . Anxiety and depression 04/28/2011  . Arthritis   . Asthma   . Atypical chest pain 11/30/2011  . 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 Baker's cyst of knee 05/22/2011  . Cancer ([place]) 01,  08   XRT/chemo 01-02/ lobular invasive ca  . Carotid artery disease ([place])    a. Carotid duplex 03/2014: stable 1-39% BICA, f/u due 03/2016.  [name] Chronic alcoholism in remission ([place]) 03/29/2011   Did not tolerate Klonopin, caused some confusion and bad dreams.    . Clotting disorder ([place])   . COPD (chronic obstructive pulmonary disease) ([place])   . Dermatitis 11/23/2012  . EE (eosinophilic esophagitis)   . Emphysema of lung ([place])   . Esophageal ring   . ESOPHAGEAL STRICTURE 03/29/2009  . Fall 11/23/2012  . Family history of breast cancer   . Family history of colon cancer   . Family history of ovarian cancer   . Family history of pancreatic cancer   . Folliculitis of nose [id number]  . GERD (gastroesophageal reflux disease) 09/29/2009   improved s/p cholecystectomy and esophagus dilatation  . History of chicken pox   . History of measles   . History of shingles    2 episodes  . Hx of echocardiogram    a. Echo 01/2013: mild LVH, EF 55-60%, normal wall motion, Gr 1 diast dysfn  . Hyperlipidemia   . Knee pain, bilateral 07/23/2011  . Medicare annual wellness visit, subsequent 06/19/2015  . Mixed hyperlipidemia 10/17/2010   Qualifier: Diagnosis of  By: Mack Guise    . Orthostasis   . Osteopenia 03/14/2011  . Osteoporosis   . Personal history of chemotherapy 2001  . Personal history of radiation therapy 2001   rt breast  .  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 PVC's (premature ventricular contractions)    a. Event monitor 01/2013: NSR, extensive PVCs.  . Radial neck fracture 10/2011   minimally displaced  . Substance abuse ([place])    In remission 3 years.   . Urinary incontinence 03/19/2012  . Vaginitis 05/22/2011    SURGICAL HISTORY: Past Surgical History:  Procedure Laterality Date  . APPENDECTOMY    . AUGMENTATION MAMMAPLASTY Right 05/27/2007  . BREAST BIOPSY Right 01/02/2007   wire loc  . BREAST BIOPSY  12/26/2006  . BREAST LUMPECTOMY Right 2001  . BREAST RECONSTRUCTION  2008, 2009, 2010  . BREAST REDUCTION WITH MASTOPEXY Left 05/30/2017   Procedure: LEFT BREAST REDUCTION FOR SYMTERY WITH MASTOPEXY;  Surgeon: Wallace Going, DO;  Location: [facility];  Service: Plastics;  Laterality: Left;  . CHOLECYSTECTOMY  2010  . COLONOSCOPY  09/05/10   cecal avm's  . DENTAL SURGERY  05/2016   4 dental implants by Dr. Loyal Gambler.  [name] ERCP  2010    CBD stone extraction   . ESOPHAGOGASTRODUODENOSCOPY  01/08/2012   Procedure: ESOPHAGOGASTRODUODENOSCOPY (EGD);  Surgeon: Gatha Mayer, MD;  Location: [facility];  Service: Endoscopy;  Laterality: N/A;  . ESOPHAGOGASTRODUODENOSCOPY (EGD) WITH ESOPHAGEAL DILATION  2010, 2012  . LAPAROSCOPIC APPENDECTOMY N/A 08/04/2016   Procedure: APPENDECTOMY LAPAROSCOPIC;  Surgeon: Michael Boston, MD;  Location: [facility];  Service: General;  Laterality: N/A;  . LIPOSUCTION WITH LIPOFILLING Left 11/21/2017   Procedure: LIPOSUCTION FROM ABDOMEN WITH LIPOFILLING TO LEFT BREAST;  Surgeon: Wallace Going, DO;  Location: [facility];  Service: Plastics;  Laterality: Left;  [name] MASTECTOMY MODIFIED RADICAL Right 05/27/2007   , Mastectomy modified radical (08), breast reconstruction, CA lesions excised lateral abd wall 2010  . MASTOPEXY Left 11/21/2017   Procedure: LEFT BREAST REVISION MASTOPEXY FOR SYMMETRY;  Surgeon: Wallace Going, DO;  Location: [place];  Service: Plastics;  Laterality: Left;  . REDUCTION MAMMAPLASTY Left   . SAVORY DILATION  01/08/2012   Procedure: SAVORY DILATION;  Surgeon: Gatha Mayer, MD;  Location: [facility];  Service: Endoscopy;  Laterality: N/A;  need xray    I have reviewed the social history and family history with the patient and they are unchanged from previous note.  ALLERGIES:  is allergic to codeine; morphine; peanut-containing drug products; sorbitol; tomato; zofran; advil [ibuprofen]; diphenhydramine hcl; and oxycodone.  MEDICATIONS:  Current Outpatient Medications  Medication Sig Dispense Refill  . acetaminophen (TYLENOL) 500 MG tablet Take 500-1,000 mg by mouth every 6 (six) hours as needed (for pain.).    [name] amitriptyline (ELAVIL) 25 MG tablet TAKE 6 TABLETS(150 MG) BY MOUTH AT BEDTIME 180 tablet 2  . atorvastatin (LIPITOR) 20 MG tablet Take 1 tablet (20 mg total) by mouth 2 (two) times daily. 180 tablet 3  . Biotin (BIOTIN 5000) 5 MG CAPS Take by mouth.    . Cholecalciferol (VITAMIN D3) 1000 UNITS CAPS Take 2,000 Units by mouth daily.     . Cyanocobalamin (VITAMIN B 12 PO) Take 1 tablet by mouth daily.    [name] diltiazem (CARDIZEM) 30 MG tablet TAKE 1 TABLET BY MOUTH EVERY DAY FOR PALPITATIONS 90 tablet 2  . Ferrous Fumarate-Folic Acid 99991111 MG TABS Take 1 tablet by mouth daily. 30 each 3  . loratadine (CLARITIN) 10 MG tablet Take 1 tablet (10 mg total) by mouth daily. 30 tablet 11  . LORazepam (ATIVAN) 1 MG tablet TAKE 1 TABLET(1 MG) BY MOUTH EVERY 8 HOURS AS NEEDED FOR ANXIETY 70 tablet 1  . pantoprazole (PROTONIX) 40 MG tablet TAKE 1 TABLET(40 MG) BY MOUTH DAILY 90 tablet 1  . permethrin (ELIMITE) 5 % cream Apply from scalp to toes, avoid face. Leave on 8-14 hours. Thoroughly rinse with soap and water. Repeat in 2 weeks if needed. 60 g 1  . rOPINIRole (REQUIP) 0.25 MG tablet Take 1 tablet (0.25 mg total) by mouth at bedtime. 30 tablet 0  . triamcinolone cream (KENALOG) 0.1 % Apply  topically 2 (two) times daily. Apply to affected area 45 g 1   No current facility-administered medications for this visit.    PHYSICAL EXAMINATION: ECOG PERFORMANCE STATUS: 1 - Symptomatic but completely ambulatory  Vitals:   03/14/20 1300  BP: 118/81  Pulse: 95  Resp: 18  Temp: 98.2 F (36.8 C)  SpO2: 98%   Filed Weights   03/14/20 1300  Weight: 139 lb 6.4 oz (63.2 kg)    GENERAL:alert, no distress and comfortable SKIN: skin color, texture, turgor are normal, no rashes or significant lesions (+) skin tag of chest EYES: normal, Conjunctiva are pink and non-injected, sclera clear  NECK: supple, thyroid normal size, non-tender, without nodularity LYMPH:  no palpable lymphadenopathy in the axillary  (+)Palpable very small left lateral cervical LN, stable LUNGS: clear to auscultation and percussion with normal breathing effort HEART: regular rate & rhythm and no murmurs and no lower extremity edema ABDOMEN:abdomen soft, non-tender and normal bowel sounds Musculoskeletal:no cyanosis of digits and no clubbing  NEURO: alert & oriented x 3 with fluent speech, no focal motor/sensory deficits BREAST: S/p right mastectomy and b/l reconstruction: Surgical incisions healed well. No palpable mass, nodules or adenopathy bilaterally. Breast exam benign.   LABORATORY DATA:  I have reviewed the data as listed CBC Latest Ref Rng & Units 03/14/2020 12/11/2019 09/14/2019  WBC 4.0 - 10.5 K/uL 6.5 5.5 6.2  Hemoglobin 12.0 - 15.0 g/dL 12.2 12.1 12.7  Hematocrit 36.0 - 46.0 % 38.2 37.4 39.3  Platelets 150 - 400 K/uL 264 281 270     CMP Latest Ref Rng & Units 12/11/2019 08/06/2019 06/10/2019  Glucose 70 - 99 mg/dL 98 88 96  BUN 8 - 23 mg/dL 16 19 21   Creatinine 0.44 - 1.00 mg/dL 0.74 0.63 0.77  Sodium 135 - 145 mmol/L 143 140 140  Potassium 3.5 - 5.1 mmol/L 3.8 4.5 3.9  Chloride 98 - 111 mmol/L 105 100 105  CO2 22 - 32 mmol/L 30 31 27   Calcium 8.9 - 10.3 mg/dL 9.3 9.9 9.7  Total Protein 6.5 -  8.1 g/dL 7.0 7.0 7.3  Total Bilirubin 0.3 - 1.2 mg/dL 0.4 0.3 0.4  Alkaline Phos 38 - 126 U/L 94 96 90  AST 15 - 41 U/L 18 19 17   ALT 0 - 44 U/L 21 15 17       RADIOGRAPHIC STUDIES: I have personally reviewed the radiological images as listed and agreed with the findings in the report. No results found.   ASSESSMENT & PLAN:  Anne [name] is a 79 y.o. female with    1. History of recurrent right breast lobular carcinoma, in 2001, 2008 and chest wall recurrence in 2010 -She was initially diagnosed in 01/2000 but unfortunately hadrecurrencetwice, the last time involvingthe chest wall in 2010.  -At that time she was treated with Xeloda and Exemestane 2011-2015.She is now on surveillance. -She has been anxious about another breast cancer or recurrence. She is interested in left mastectomy. Ipreviouslydiscussed given her age and comorbiditiesI do not recommendmastectomy and it is not Government social research officer. We will continue MRI breast and Mammograms yearly.  -She is clinically doing well. Lab reviewed, CBC WNL. Iron panel still pending. Her physical exam and her 10/2019 MRI breast were unremarkable. There is no clinical concern for recurrence. -Continue surveillance. Next Mammogram in 05/2020. Next MRI in 10/2020. She will be due for DEXA 06/2020.  -f/u every 6 months per pt Request.    2. Right shoulder and clavicular pain, Right thigh spasms, B/l LE joint pain, Fall  -In late 2020 she had has superior-medial clavicularpain that is now constant and flares. She also has right shoulder pain with limited ROM form this. -Her 11/24/19 Bone scan showed No definite evidence of osseous metastatic disease and Probable degenerative changes in the right sternoclavicular joint and bilateral knees.   -She tried PT once but stopped due to it exacerbating her pain. She notes her pain is resolving on its now. Minimal currently.  -She does notes she notes spasms in her right upper lateral thigh and  required 2 steroid injections in the past with little benefit. I recommend more water, OTC magnesium  and potassium. She is on Calcium.  -She notes muscle and joint stiffness in her anterior legs occasionally. Standing hurts the most and then she can walk it off.  -She did have fall last week and hit her head. She denies syncope or injury and has recovered. I suggest she try PT again to help her strength and increase exercise of her legs.  -I discussed these indicate arthritis. She can f/u with her orthopedists and PCP about this.    3. Left lateral Cervical LN with mild enlargement -She has tiny palpable left lateral LN on 12/10/18 exam -Given small size we will monitor. I encouraged her to watch for increased size or more LNs.  -Stable on exam today, likely benign (03/14/20)  4. RadiationPulmonaryFibrosis, COPD, Emphysema, Seizures  -She continues to f/u with Pulmonologist and Neurologist Dr Berdine Addison.   5. Neuropathy of soles of feet  -She notes this has been ongoing for some time but has recently become constant and tingling increased. She denies burning pain -I discussed Gabapentin can help her tingling. She will consider it if tingling worsens.It's tolerable now -She is a vegetarian but takes Oral B12.Her B12 level is normal.She will consult with Dr. Mickeal Skinner  -Not mentioned today, likely improved   PLAN: -She is clinically doing well, right clavicular pain has near resolved  -Mammogram in 05/2020 -DEXA at [facility] in 06/2020  -Lab and f/u in 6 months, will order screening breast MRI on next visit    No problem-specific Assessment & Plan notes found for this encounter.   No orders of the defined types were placed in this encounter.  All questions were answered. The patient knows to call the clinic with any problems, questions or concerns. No barriers to learning was detected. The total time spent in the appointment was 25 minutes.     Truitt Merle, MD 03/14/2020   I, Joslyn Devon, am acting as scribe for Truitt Merle, MD.   I have reviewed the above documentation for accuracy and completeness, and I agree with the above.

## 2020-03-13 ENCOUNTER — Other Ambulatory Visit: Payer: Self-pay | Admitting: Family Medicine

## 2020-03-14 ENCOUNTER — Inpatient Hospital Stay (HOSPITAL_BASED_OUTPATIENT_CLINIC_OR_DEPARTMENT_OTHER): Payer: PPO | Admitting: Hematology

## 2020-03-14 ENCOUNTER — Other Ambulatory Visit: Payer: Self-pay

## 2020-03-14 ENCOUNTER — Inpatient Hospital Stay: Payer: PPO | Attending: Hematology

## 2020-03-14 VITALS — BP 118/81 | HR 95 | Temp 98.2°F | Resp 18 | Ht 65.5 in | Wt 139.4 lb

## 2020-03-14 DIAGNOSIS — Z862 Personal history of diseases of the blood and blood-forming organs and certain disorders involving the immune mechanism: Secondary | ICD-10-CM | POA: Insufficient documentation

## 2020-03-14 DIAGNOSIS — Z170 Estrogen receptor positive status [ER+]: Secondary | ICD-10-CM | POA: Insufficient documentation

## 2020-03-14 DIAGNOSIS — Z923 Personal history of irradiation: Secondary | ICD-10-CM | POA: Insufficient documentation

## 2020-03-14 DIAGNOSIS — D649 Anemia, unspecified: Secondary | ICD-10-CM

## 2020-03-14 DIAGNOSIS — Z853 Personal history of malignant neoplasm of breast: Secondary | ICD-10-CM | POA: Diagnosis not present

## 2020-03-14 DIAGNOSIS — Z79899 Other long term (current) drug therapy: Secondary | ICD-10-CM | POA: Diagnosis not present

## 2020-03-14 DIAGNOSIS — M8589 Other specified disorders of bone density and structure, multiple sites: Secondary | ICD-10-CM | POA: Insufficient documentation

## 2020-03-14 LAB — CBC WITH DIFFERENTIAL (CANCER CENTER ONLY)
Abs Immature Granulocytes: 0.01 10*3/uL (ref 0.00–0.07)
Basophils Absolute: 0.1 10*3/uL (ref 0.0–0.1)
Basophils Relative: 1 %
Eosinophils Absolute: 0.2 10*3/uL (ref 0.0–0.5)
Eosinophils Relative: 3 %
HCT: 38.2 % (ref 36.0–46.0)
Hemoglobin: 12.2 g/dL (ref 12.0–15.0)
Immature Granulocytes: 0 %
Lymphocytes Relative: 25 %
Lymphs Abs: 1.6 10*3/uL (ref 0.7–4.0)
MCH: 31.3 pg (ref 26.0–34.0)
MCHC: 31.9 g/dL (ref 30.0–36.0)
MCV: 97.9 fL (ref 80.0–100.0)
Monocytes Absolute: 0.7 10*3/uL (ref 0.1–1.0)
Monocytes Relative: 10 %
Neutro Abs: 3.9 10*3/uL (ref 1.7–7.7)
Neutrophils Relative %: 61 %
Platelet Count: 264 10*3/uL (ref 150–400)
RBC: 3.9 MIL/uL (ref 3.87–5.11)
RDW: 13.3 % (ref 11.5–15.5)
WBC Count: 6.5 10*3/uL (ref 4.0–10.5)
nRBC: 0 % (ref 0.0–0.2)

## 2020-03-14 LAB — IRON AND TIBC
Iron: 71 ug/dL (ref 41–142)
Saturation Ratios: 22 % (ref 21–57)
TIBC: 318 ug/dL (ref 236–444)
UIBC: 246 ug/dL (ref 120–384)

## 2020-03-14 LAB — FERRITIN: Ferritin: 52 ng/mL (ref 11–307)

## 2020-03-15 ENCOUNTER — Telehealth: Payer: Self-pay | Admitting: Hematology

## 2020-03-15 NOTE — Telephone Encounter (Signed)
Scheduled appt per 5/3 los.  Spoke with pt and she is aware of the appt date and time. 

## 2020-03-16 ENCOUNTER — Encounter: Payer: Self-pay | Admitting: Hematology

## 2020-03-18 ENCOUNTER — Encounter: Payer: Self-pay | Admitting: Hematology

## 2020-03-24 ENCOUNTER — Encounter: Payer: Self-pay | Admitting: Family Medicine

## 2020-03-24 ENCOUNTER — Encounter: Payer: Self-pay | Admitting: Hematology

## 2020-03-24 DIAGNOSIS — E559 Vitamin D deficiency, unspecified: Secondary | ICD-10-CM | POA: Diagnosis not present

## 2020-03-24 DIAGNOSIS — R569 Unspecified convulsions: Secondary | ICD-10-CM | POA: Diagnosis not present

## 2020-03-24 DIAGNOSIS — Z79899 Other long term (current) drug therapy: Secondary | ICD-10-CM | POA: Diagnosis not present

## 2020-03-24 DIAGNOSIS — E531 Pyridoxine deficiency: Secondary | ICD-10-CM | POA: Diagnosis not present

## 2020-03-24 DIAGNOSIS — G589 Mononeuropathy, unspecified: Secondary | ICD-10-CM | POA: Diagnosis not present

## 2020-03-24 DIAGNOSIS — R5383 Other fatigue: Secondary | ICD-10-CM | POA: Diagnosis not present

## 2020-03-24 DIAGNOSIS — E538 Deficiency of other specified B group vitamins: Secondary | ICD-10-CM | POA: Diagnosis not present

## 2020-03-24 DIAGNOSIS — G603 Idiopathic progressive neuropathy: Secondary | ICD-10-CM | POA: Diagnosis not present

## 2020-03-27 ENCOUNTER — Encounter: Payer: Self-pay | Admitting: Emergency Medicine

## 2020-03-27 ENCOUNTER — Other Ambulatory Visit: Payer: Self-pay

## 2020-03-27 ENCOUNTER — Emergency Department
Admission: EM | Admit: 2020-03-27 | Discharge: 2020-03-27 | Disposition: A | Discharge status: Home / Self Care | Payer: PPO | Source: Home / Self Care

## 2020-03-27 DIAGNOSIS — J029 Acute pharyngitis, unspecified: Secondary | ICD-10-CM | POA: Diagnosis not present

## 2020-03-27 NOTE — ED Triage Notes (Signed)
Patient states that she awoke this am with a sore throat, has not taken any Tylenol or Ibuprofen, afebrile, no runny nose or ear pain.

## 2020-03-27 NOTE — Discharge Instructions (Signed)
  You may take 500mg acetaminophen every 4-6 hours or in combination with ibuprofen 400-600mg every 6-8 hours as needed for pain, inflammation, and fever.  Be sure to well hydrated with clear liquids and get at least 8 hours of sleep at night, preferably more while sick.   Please follow up with family medicine in 1 week if needed.   

## 2020-03-27 NOTE — ED Provider Notes (Signed)
[name]    CSN: [id number] Arrival date & time: 03/27/20  1253      History   Chief Complaint Chief Complaint  Patient presents with  . Sore Throat    HPI Anne [name] is a 79 y.o. female.   HPI  Anne [name] is a 79 y.o. female presenting to UC with c/o sudden onset sore throat that started this morning when pt woke up. Pain was worse with swallowing. Pt concerned she may have strep throat as pain is similar.  She did have coffee this morning, which helped as well as applying asper cream to her anterior neck but she has not taken any oral pain medication. Denies fever, chills, cough, congestion, HA, n/v/d. No sick contacts or recent travel.    Past Medical History:  Diagnosis Date  . ALKALINE PHOSPHATASE, ELEVATED 03/15/2009  . Allergic state 06/10/2012  . Allergy   . Anemia   . Anxiety and depression 04/28/2011  . Arthritis   . Asthma   . Atypical chest pain 11/30/2011  . 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 Baker's cyst of knee 05/22/2011  . Cancer ([place]) 01,  08   XRT/chemo 01-02/ lobular invasive ca  . Carotid artery disease ([place])    a. Carotid duplex 03/2014: stable 1-39% BICA, f/u due 03/2016.  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    . Clotting disorder ([place])   . COPD (chronic obstructive pulmonary disease) ([place])   . Dermatitis 11/23/2012  . EE (eosinophilic esophagitis)   . Emphysema of lung ([place])   . Esophageal ring   . ESOPHAGEAL STRICTURE 03/29/2009  . Fall 11/23/2012  . Family history of breast cancer   . Family history of colon cancer   . Family history of ovarian cancer   . Family history of pancreatic cancer   . Folliculitis of nose [id number]  . GERD (gastroesophageal reflux disease) 09/29/2009   improved s/p cholecystectomy and esophagus dilatation  . History of chicken pox   . History of measles   . History of shingles    2  episodes  . Hx of echocardiogram    a. Echo 01/2013: mild LVH, EF 55-60%, normal wall motion, Gr 1 diast dysfn  . Hyperlipidemia   . Knee pain, bilateral 07/23/2011  . Medicare annual wellness visit, subsequent 06/19/2015  . Mixed hyperlipidemia 10/17/2010   Qualifier: Diagnosis of  By: Mack Guise    . Orthostasis   . Osteopenia 03/14/2011  . Osteoporosis   . Personal history of chemotherapy 2001  . Personal history of radiation therapy 2001   rt breast  . 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 PVC's (premature ventricular contractions)    a. Event monitor 01/2013: NSR, extensive PVCs.  . Radial neck fracture 10/2011   minimally displaced  . Substance abuse ([place])    In remission 3 years.   . Urinary incontinence 03/19/2012  . Vaginitis 05/22/2011    Patient Active Problem List   Diagnosis Date Noted  . RLS (restless legs syndrome) 02/24/2020  . 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 Facial trauma, sequela 08/09/2019  . Hyperglycemia 08/19/2018  . Mild intermittent asthma without complication [id number]  . Sore throat 05/27/2018  . Asthma 04/30/2018  . 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 Palpitations 04/30/2018  . Skin lesion of face 02/07/2018  . Low back pain 09/23/2017  . Hypokalemia 08/11/2017  .  Abdominal pain 08/11/2017  . SBO (small bowel obstruction) ([place]) 08/10/2017  . History of right breast cancer 03/21/2017  . Genetic testing 09/13/2016  . Family history of breast cancer   . Family history of pancreatic cancer   . Family history of colon cancer   . Pain in the chest 05/16/2016  . Status post right breast reconstruction 05/11/2016  . Breast cancer 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 ([place]) 05/11/2016  . History of breast cancer in female 05/11/2016  . Osteopenia determined by x-ray 05/11/2016  . IBS (irritable bowel syndrome) 03/09/2016  . Dyspnea 06/19/2015  . Medicare annual wellness visit, subsequent 06/19/2015  . Urinary frequency 12/12/2014  . Breast  cancer, right breast ([place]) 10/18/2014  . Superficial thrombophlebitis 03/16/2014  . Plant dermatitis 03/16/2014  . Chest wall pain 02/07/2014  . Primary localized osteoarthrosis, lower leg 01/21/2014  . Elevated sed rate 08/02/2013  . Dermatitis 11/23/2012  . Falls 11/23/2012  . Preventative health care 10/21/2012  . Allergy 06/10/2012  . Urinary incontinence 03/19/2012  . Anemia 12/21/2011  . Knee pain, bilateral 07/23/2011  . Baker's cyst of knee 05/22/2011  . Eosinophilic esophagitis [id number]  . Anxiety and depression 04/28/2011  . 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 Constipation, chronic 03/15/2011  . Mixed hyperlipidemia 10/17/2010  . CAROTID ARTERY STENOSIS 01/13/2010  . GERD 09/29/2009  . PERSONAL HX BREAST CANCER 09/29/2009    Past Surgical History:  Procedure Laterality Date  . APPENDECTOMY    . AUGMENTATION MAMMAPLASTY Right 05/27/2007  . BREAST BIOPSY Right 01/02/2007   wire loc  . BREAST BIOPSY  12/26/2006  . BREAST LUMPECTOMY Right 2001  . BREAST RECONSTRUCTION  2008, 2009, 2010  . BREAST REDUCTION WITH MASTOPEXY Left 05/30/2017   Procedure: LEFT BREAST REDUCTION FOR SYMTERY WITH MASTOPEXY;  Surgeon: Wallace Going, DO;  Location: [facility];  Service: Plastics;  Laterality: Left;  . CHOLECYSTECTOMY  2010  . COLONOSCOPY  09/05/10   cecal avm's  . DENTAL SURGERY  05/2016   4 dental implants by Dr. Loyal Gambler.  [name] ERCP  2010    CBD stone extraction   . ESOPHAGOGASTRODUODENOSCOPY  01/08/2012   Procedure: ESOPHAGOGASTRODUODENOSCOPY (EGD);  Surgeon: Gatha Mayer, MD;  Location: [facility];  Service: Endoscopy;  Laterality: N/A;  . ESOPHAGOGASTRODUODENOSCOPY (EGD) WITH ESOPHAGEAL DILATION  2010, 2012  . LAPAROSCOPIC APPENDECTOMY N/A 08/04/2016   Procedure: APPENDECTOMY LAPAROSCOPIC;  Surgeon: Michael Boston, MD;  Location: [facility];  Service: General;  Laterality: N/A;  . LIPOSUCTION WITH LIPOFILLING Left 11/21/2017   Procedure:  LIPOSUCTION FROM ABDOMEN WITH LIPOFILLING TO LEFT BREAST;  Surgeon: Wallace Going, DO;  Location: [facility];  Service: Plastics;  Laterality: Left;  [name] MASTECTOMY MODIFIED RADICAL Right 05/27/2007   , Mastectomy modified radical (08), breast reconstruction, CA lesions excised lateral abd wall 2010  . MASTOPEXY Left 11/21/2017   Procedure: LEFT BREAST REVISION MASTOPEXY FOR SYMMETRY;  Surgeon: Wallace Going, DO;  Location: [facility];  Service: Plastics;  Laterality: Left;  . REDUCTION MAMMAPLASTY Left   . SAVORY DILATION  01/08/2012   Procedure: SAVORY DILATION;  Surgeon: Gatha Mayer, MD;  Location: [facility];  Service: Endoscopy;  Laterality: N/A;  need xray    OB History   No obstetric history on file.      Home Medications    Prior to Admission medications   Medication Sig Start Date End Date Taking? Authorizing Provider  acetaminophen (TYLENOL) 500 MG tablet Take 500-1,000 mg  by mouth every 6 (six) hours as needed (for pain.).   Yes [provider]  amitriptyline (ELAVIL) 25 MG tablet TAKE 6 TABLETS(150 MG) BY MOUTH AT BEDTIME 03/14/20  Yes [name], MD  atorvastatin (LIPITOR) 20 MG tablet Take 1 tablet (20 mg total) by mouth 2 (two) times daily. 11/10/19  Yes Fay Records, MD  Biotin (BIOTIN 5000) 5 MG CAPS Take by mouth.   Yes [provider]  Cholecalciferol (VITAMIN D3) 1000 UNITS CAPS Take 2,000 Units by mouth daily.    Yes [provider]  Cyanocobalamin (VITAMIN B 12 PO) Take 1 tablet by mouth daily.   Yes [provider]  diltiazem (CARDIZEM) 30 MG tablet TAKE 1 TABLET BY MOUTH EVERY DAY FOR PALPITATIONS 02/22/20  Yes Fay Records, MD  Ferrous Fumarate-Folic Acid 99991111 MG TABS Take 1 tablet by mouth daily. 09/03/18  Yes [name], MD  loratadine (CLARITIN) 10 MG tablet Take 1 tablet (10 mg total) by mouth daily. 04/29/18  Yes [name], MD  LORazepam (ATIVAN) 1 MG tablet TAKE  1 TABLET(1 MG) BY MOUTH EVERY 8 HOURS AS NEEDED FOR ANXIETY 12/09/19  Yes [name], MD  pantoprazole (PROTONIX) 40 MG tablet TAKE 1 TABLET(40 MG) BY MOUTH DAILY 01/01/20  Yes [name], MD  permethrin (ELIMITE) 5 % cream Apply from scalp to toes, avoid face. Leave on 8-14 hours. Thoroughly rinse with soap and water. Repeat in 2 weeks if needed. 06/19/19  Yes Phelps, Erin O, PA-C  rOPINIRole (REQUIP) 0.25 MG tablet Take 1 tablet (0.25 mg total) by mouth at bedtime. 03/06/20  Yes [name], MD  triamcinolone cream (KENALOG) 0.1 % Apply topically 2 (two) times daily. Apply to affected area 04/15/19  Yes [name], MD    Family History Family History  Problem Relation Age of Onset  . Heart disease Father   . Lung cancer Father        smoker  . Cirrhosis Sister        Primary Biliary  . Stroke Maternal Grandmother   . Alcohol abuse Maternal Grandfather   . Heart disease Paternal Grandfather   . Esophageal cancer Paternal Grandfather   . Rectal cancer Paternal Grandfather   . Anxiety disorder Sister   . Osteoporosis Sister   . Arthritis Sister        Rheumatoid  . Osteoporosis Sister   . Skin cancer Sister        multiple skin cancers, over 33 excisions.  . Other Mother        tic [place]  . Breast cancer Maternal Aunt        dx in her 71s  . Breast cancer Paternal Aunt        dx in her 61s  . Pancreatic cancer Maternal Uncle        dx in his 94s; smoker  . Breast cancer Maternal Aunt        dx in her 76s  . Breast cancer Maternal Aunt        possible breast cancer dx and died in her 35s  . Ovarian cancer Maternal Aunt 29  . Pancreatic cancer Maternal Aunt        dx in her 92s  . Stomach cancer Maternal Uncle   . Colon cancer Maternal Uncle   . Lung cancer Paternal Aunt   . Breast cancer Cousin        paternal first cousin  .  Breast cancer Cousin        maternal first cousin  . Anesthesia problems Neg Hx   . Hypotension Neg Hx   . Malignant  hyperthermia Neg Hx   . Pseudochol deficiency Neg Hx     Social History Social History   Tobacco Use  . Smoking status: Former Smoker    Packs/day: 1.00    Years: 50.00    Pack years: 50.00    Types: Cigarettes    Quit date: 05/22/2007    Years since quitting: 12.8  . Smokeless tobacco: Never Used  Substance Use Topics  . Alcohol use: Not Currently  . Drug use: No     Allergies   Codeine, Morphine, Peanut-containing drug products, Sorbitol, Tomato, Zofran, Advil [ibuprofen], Diphenhydramine hcl, and Oxycodone   Review of Systems Review of Systems  Constitutional: Negative for chills and fever.  HENT: Positive for sore throat. Negative for congestion, ear pain, trouble swallowing and voice change.   Respiratory: Negative for cough and shortness of breath.   Cardiovascular: Negative for chest pain and palpitations.  Gastrointestinal: Negative for abdominal pain, diarrhea, nausea and vomiting.  Musculoskeletal: Negative for arthralgias, back pain and myalgias.  Skin: Negative for rash.  All other systems reviewed and are negative.    Physical Exam Triage Vital Signs ED Triage Vitals  Enc Vitals Group     BP 03/27/20 1313 118/76     Pulse Rate 03/27/20 1313 (!) 103     Resp --      Temp 03/27/20 1313 98.7 F (37.1 C)     Temp Source 03/27/20 1313 Oral     SpO2 03/27/20 1313 97 %     Weight --      Height --      Head Circumference --      Peak Flow --      Pain Score 03/27/20 1314 4     Pain Loc --      Pain Edu? --      Excl. in [place]? --    No data found.  Updated Vital Signs BP 118/76 (BP Location: Left Arm)   Pulse (!) 103   Temp 98.7 F (37.1 C) (Oral)   SpO2 97%   Visual Acuity Right Eye Distance:   Left Eye Distance:   Bilateral Distance:    Right Eye Near:   Left Eye Near:    Bilateral Near:     Physical Exam Vitals and nursing note reviewed.  Constitutional:      General: She is not in acute distress.    Appearance: She is  well-developed. She is not ill-appearing, toxic-appearing or diaphoretic.  HENT:     Head: Normocephalic and atraumatic.     Right Ear: Tympanic membrane and ear canal normal.     Left Ear: Tympanic membrane and ear canal normal.     Nose: Nose normal.     Right Sinus: No maxillary sinus tenderness or frontal sinus tenderness.     Left Sinus: No maxillary sinus tenderness or frontal sinus tenderness.     Mouth/Throat:     Lips: Pink.     Mouth: Mucous membranes are moist.     Pharynx: Oropharynx is clear. Uvula midline. Posterior oropharyngeal erythema present. No pharyngeal swelling, oropharyngeal exudate or uvula swelling.  Cardiovascular:     Rate and Rhythm: Normal rate and regular rhythm.  Pulmonary:     Effort: Pulmonary effort is normal. No respiratory distress.     Breath sounds: Normal breath  sounds. No stridor. No wheezing, rhonchi or rales.  Musculoskeletal:        General: Normal range of motion.     Cervical back: Normal range of motion and neck supple.  Lymphadenopathy:     Cervical: No cervical adenopathy.  Skin:    General: Skin is warm and dry.  Neurological:     Mental Status: She is alert and oriented to person, place, and time.  Psychiatric:        Behavior: Behavior normal.      UC Treatments / Results  Labs (all labs ordered are listed, but only abnormal results are displayed) Labs Reviewed  STREP A DNA PROBE  POCT RAPID STREP A (OFFICE)    EKG   Radiology No results found.  Procedures Procedures (including critical care time)  Medications Ordered in UC Medications - No data to display  Initial Impression / Assessment and Plan / UC Course  I have reviewed the triage vital signs and the nursing notes.  Pertinent labs & imaging results that were available during my care of the patient were reviewed by me and considered in my medical decision making (see chart for details).     Rapid strep: NEGATIVE Culture sent Encouraged conservative  tx  F/u with PCP as needed AVS provided.  Final Clinical Impressions(s) / UC Diagnoses   Final diagnoses:  Sore throat     Discharge Instructions      You may take 500mg  acetaminophen every 4-6 hours or in combination with ibuprofen 400-600mg  every 6-8 hours as needed for pain, inflammation, and fever.  Be sure to well hydrated with clear liquids and get at least 8 hours of sleep at night, preferably more while sick.   Please follow up with family medicine in 1 week if needed.     ED Prescriptions    None     PDMP not reviewed this encounter.   Noe Gens, [place] 03/27/20 1358

## 2020-03-28 LAB — STREP A DNA PROBE: Group A Strep Probe: NOT DETECTED

## 2020-04-06 ENCOUNTER — Ambulatory Visit: Payer: PPO | Admitting: Internal Medicine

## 2020-04-06 ENCOUNTER — Encounter: Payer: Self-pay | Admitting: Family Medicine

## 2020-04-06 ENCOUNTER — Emergency Department
Admission: EM | Admit: 2020-04-06 | Discharge: 2020-04-06 | Disposition: A | Discharge status: Home / Self Care | Payer: PPO | Source: Home / Self Care

## 2020-04-06 ENCOUNTER — Other Ambulatory Visit: Payer: Self-pay

## 2020-04-06 DIAGNOSIS — R21 Rash and other nonspecific skin eruption: Secondary | ICD-10-CM | POA: Diagnosis not present

## 2020-04-06 MED ORDER — PREDNISONE 10 MG PO TABS: ORAL_TABLET | ORAL | 0 refills | Status: DC

## 2020-04-06 NOTE — ED Triage Notes (Addendum)
Pt c/o rash all over arms and legs. Says shes been laying out in the sun a lot for Vit D. Thinks its a sun rash. Also mentions she started eating peanuts again, says she quit years ago due to rash. Last time she ate peanuts was day before yesterday. Itching all over, triamcinolone tried with little relief.

## 2020-04-06 NOTE — ED Provider Notes (Signed)
[name]    CSN: [id number] Arrival date & time: 04/06/20  1704      History   Chief Complaint Chief Complaint  Patient presents with  . Rash    HPI Anne [name] is a 79 y.o. female.   The history is provided by the patient. No language interpreter was used.  Rash Location:  Shoulder/arm and leg Shoulder/arm rash location:  L arm and R arm Leg rash location:  L leg and R leg Quality: itchiness and redness   Timing:  Constant Progression:  Worsening Chronicity:  New Context: sun exposure   Worsened by:  Nothing Ineffective treatments:  None tried Patient developed a rash after laying out in the sun.  Patient was told that she needed to get sun exposure to increase her vitamin B levels.  Patient reports rash only to sun exposed area on the frontal aspect of her body.  Patient reports rash is itchy.  She had normal laboratory evaluations in March.  I reviewed her records and she had normal platelets at that time  Past Medical History:  Diagnosis Date  . ALKALINE PHOSPHATASE, ELEVATED 03/15/2009  . Allergic state 06/10/2012  . Allergy   . Anemia   . Anxiety and depression 04/28/2011  . Arthritis   . Asthma   . Atypical chest pain 11/30/2011  . 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 Baker's cyst of knee 05/22/2011  . Cancer ([place]) 01,  08   XRT/chemo 01-02/ lobular invasive ca  . Carotid artery disease ([place])    a. Carotid duplex 03/2014: stable 1-39% BICA, f/u due 03/2016.  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    . Clotting disorder ([place])   . COPD (chronic obstructive pulmonary disease) ([place])   . Dermatitis 11/23/2012  . EE (eosinophilic esophagitis)   . Emphysema of lung ([place])   . Esophageal ring   . ESOPHAGEAL STRICTURE 03/29/2009  . Fall 11/23/2012  . Family history of breast cancer   . Family history of colon cancer   . Family history of ovarian cancer    . Family history of pancreatic cancer   . Folliculitis of nose [id number]  . GERD (gastroesophageal reflux disease) 09/29/2009   improved s/p cholecystectomy and esophagus dilatation  . History of chicken pox   . History of measles   . History of shingles    2 episodes  . Hx of echocardiogram    a. Echo 01/2013: mild LVH, EF 55-60%, normal wall motion, Gr 1 diast dysfn  . Hyperlipidemia   . Knee pain, bilateral 07/23/2011  . Medicare annual wellness visit, subsequent 06/19/2015  . Mixed hyperlipidemia 10/17/2010   Qualifier: Diagnosis of  By: Mack Guise    . Orthostasis   . Osteopenia 03/14/2011  . Osteoporosis   . Personal history of chemotherapy 2001  . Personal history of radiation therapy 2001   rt breast  . 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 PVC's (premature ventricular contractions)    a. Event monitor 01/2013: NSR, extensive PVCs.  . Radial neck fracture 10/2011   minimally displaced  . Substance abuse ([place])    In remission 3 years.   . Urinary incontinence 03/19/2012  . Vaginitis 05/22/2011    Patient Active Problem List   Diagnosis Date Noted  . RLS (restless legs syndrome) 02/24/2020  . 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 Facial trauma, sequela 08/09/2019  .  Hyperglycemia 08/19/2018  . Mild intermittent asthma without complication [id number]  . Sore throat 05/27/2018  . Asthma 04/30/2018  . 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 Palpitations 04/30/2018  . Skin lesion of face 02/07/2018  . Low back pain 09/23/2017  . Hypokalemia 08/11/2017  . Abdominal pain 08/11/2017  . SBO (small bowel obstruction) ([place]) 08/10/2017  . History of right breast cancer 03/21/2017  . Genetic testing 09/13/2016  . Family history of breast cancer   . Family history of pancreatic cancer   . Family history of colon cancer   . Pain in the chest 05/16/2016  . Status post right breast reconstruction 05/11/2016  . Breast cancer 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 ([place])  05/11/2016  . History of breast cancer in female 05/11/2016  . Osteopenia determined by x-ray 05/11/2016  . IBS (irritable bowel syndrome) 03/09/2016  . Dyspnea 06/19/2015  . Medicare annual wellness visit, subsequent 06/19/2015  . Urinary frequency 12/12/2014  . Breast cancer, right breast ([place]) 10/18/2014  . Superficial thrombophlebitis 03/16/2014  . Plant dermatitis 03/16/2014  . Chest wall pain 02/07/2014  . Primary localized osteoarthrosis, lower leg 01/21/2014  . Elevated sed rate 08/02/2013  . Dermatitis 11/23/2012  . Falls 11/23/2012  . Preventative health care 10/21/2012  . Allergy 06/10/2012  . Urinary incontinence 03/19/2012  . Anemia 12/21/2011  . Knee pain, bilateral 07/23/2011  . Baker's cyst of knee 05/22/2011  . Eosinophilic esophagitis [id number]  . Anxiety and depression 04/28/2011  . 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 Constipation, chronic 03/15/2011  . Mixed hyperlipidemia 10/17/2010  . CAROTID ARTERY STENOSIS 01/13/2010  . GERD 09/29/2009  . PERSONAL HX BREAST CANCER 09/29/2009    Past Surgical History:  Procedure Laterality Date  . APPENDECTOMY    . AUGMENTATION MAMMAPLASTY Right 05/27/2007  . BREAST BIOPSY Right 01/02/2007   wire loc  . BREAST BIOPSY  12/26/2006  . BREAST LUMPECTOMY Right 2001  . BREAST RECONSTRUCTION  2008, 2009, 2010  . BREAST REDUCTION WITH MASTOPEXY Left 05/30/2017   Procedure: LEFT BREAST REDUCTION FOR SYMTERY WITH MASTOPEXY;  Surgeon: Wallace Going, DO;  Location: [facility];  Service: Plastics;  Laterality: Left;  . CHOLECYSTECTOMY  2010  . COLONOSCOPY  09/05/10   cecal avm's  . DENTAL SURGERY  05/2016   4 dental implants by Dr. Loyal Gambler.  [name] ERCP  2010    CBD stone extraction   . ESOPHAGOGASTRODUODENOSCOPY  01/08/2012   Procedure: ESOPHAGOGASTRODUODENOSCOPY (EGD);  Surgeon: Gatha Mayer, MD;  Location: [facility];  Service: Endoscopy;  Laterality: N/A;  . ESOPHAGOGASTRODUODENOSCOPY  (EGD) WITH ESOPHAGEAL DILATION  2010, 2012  . LAPAROSCOPIC APPENDECTOMY N/A 08/04/2016   Procedure: APPENDECTOMY LAPAROSCOPIC;  Surgeon: Michael Boston, MD;  Location: [facility];  Service: General;  Laterality: N/A;  . LIPOSUCTION WITH LIPOFILLING Left 11/21/2017   Procedure: LIPOSUCTION FROM ABDOMEN WITH LIPOFILLING TO LEFT BREAST;  Surgeon: Wallace Going, DO;  Location: [facility];  Service: Plastics;  Laterality: Left;  [name] MASTECTOMY MODIFIED RADICAL Right 05/27/2007   , Mastectomy modified radical (08), breast reconstruction, CA lesions excised lateral abd wall 2010  . MASTOPEXY Left 11/21/2017   Procedure: LEFT BREAST REVISION MASTOPEXY FOR SYMMETRY;  Surgeon: Wallace Going, DO;  Location: [facility];  Service: Plastics;  Laterality: Left;  . REDUCTION MAMMAPLASTY Left   . SAVORY DILATION  01/08/2012   Procedure: SAVORY DILATION;  Surgeon: Gatha Mayer, MD;  Location: [facility];  Service: Endoscopy;  Laterality: N/A;  need xray    OB History   No obstetric history on file.      Home Medications    Prior to Admission medications   Medication Sig Start Date End Date Taking? Authorizing Provider  acetaminophen (TYLENOL) 500 MG tablet Take 500-1,000 mg by mouth every 6 (six) hours as needed (for pain.).    [provider]  amitriptyline (ELAVIL) 25 MG tablet TAKE 6 TABLETS(150 MG) BY MOUTH AT BEDTIME 03/14/20   [name], MD  atorvastatin (LIPITOR) 20 MG tablet Take 1 tablet (20 mg total) by mouth 2 (two) times daily. 11/10/19   Fay Records, MD  Biotin (BIOTIN 5000) 5 MG CAPS Take by mouth.    [provider]  Cholecalciferol (VITAMIN D3) 1000 UNITS CAPS Take 2,000 Units by mouth daily.     [provider]  Cyanocobalamin (VITAMIN B 12 PO) Take 1 tablet by mouth daily.    [provider]  diltiazem (CARDIZEM) 30 MG tablet TAKE 1 TABLET BY MOUTH EVERY DAY FOR PALPITATIONS 02/22/20   Fay Records, MD    Ferrous Fumarate-Folic Acid 99991111 MG TABS Take 1 tablet by mouth daily. 09/03/18   [name], MD  loratadine (CLARITIN) 10 MG tablet Take 1 tablet (10 mg total) by mouth daily. 04/29/18   [name], MD  LORazepam (ATIVAN) 1 MG tablet TAKE 1 TABLET(1 MG) BY MOUTH EVERY 8 HOURS AS NEEDED FOR ANXIETY 12/09/19   [name], MD  pantoprazole (PROTONIX) 40 MG tablet TAKE 1 TABLET(40 MG) BY MOUTH DAILY 01/01/20   [name], MD  permethrin (ELIMITE) 5 % cream Apply from scalp to toes, avoid face. Leave on 8-14 hours. Thoroughly rinse with soap and water. Repeat in 2 weeks if needed. 06/19/19   Noe Gens, PA-C  predniSONE (DELTASONE) 10 MG tablet 6,5,4,3,2,1 taper 04/06/20   Fransico Meadow, PA-C  rOPINIRole (REQUIP) 0.25 MG tablet Take 1 tablet (0.25 mg total) by mouth at bedtime. 03/06/20   [name], MD  triamcinolone cream (KENALOG) 0.1 % Apply topically 2 (two) times daily. Apply to affected area 04/15/19   [name], MD    Family History Family History  Problem Relation Age of Onset  . Heart disease Father   . Lung cancer Father        smoker  . Cirrhosis Sister        Primary Biliary  . Stroke Maternal Grandmother   . Alcohol abuse Maternal Grandfather   . Heart disease Paternal Grandfather   . Esophageal cancer Paternal Grandfather   . Rectal cancer Paternal Grandfather   . Anxiety disorder Sister   . Osteoporosis Sister   . Arthritis Sister        Rheumatoid  . Osteoporosis Sister   . Skin cancer Sister        multiple skin cancers, over 23 excisions.  . Other Mother        tic [place]  . Breast cancer Maternal Aunt        dx in her 56s  . Breast cancer Paternal Aunt        dx in her 55s  . Pancreatic cancer Maternal Uncle        dx in his 11s; smoker  . Breast cancer Maternal Aunt        dx in her 53s  . Breast cancer Maternal Aunt        possible breast cancer dx and died in  her 58s  . Ovarian cancer Maternal Aunt 29  . Pancreatic  cancer Maternal Aunt        dx in her 42s  . Stomach cancer Maternal Uncle   . Colon cancer Maternal Uncle   . Lung cancer Paternal Aunt   . Breast cancer Cousin        paternal first cousin  . Breast cancer Cousin        maternal first cousin  . Anesthesia problems Neg Hx   . Hypotension Neg Hx   . Malignant hyperthermia Neg Hx   . Pseudochol deficiency Neg Hx     Social History Social History   Tobacco Use  . Smoking status: Former Smoker    Packs/day: 1.00    Years: 50.00    Pack years: 50.00    Types: Cigarettes    Quit date: 05/22/2007    Years since quitting: 12.8  . Smokeless tobacco: Never Used  Substance Use Topics  . Alcohol use: Not Currently  . Drug use: No     Allergies   Codeine, Morphine, Peanut-containing drug products, Sorbitol, Tomato, Zofran, Advil [ibuprofen], Diphenhydramine hcl, and Oxycodone   Review of Systems Review of Systems  Skin: Positive for rash.  All other systems reviewed and are negative.    Physical Exam Triage Vital Signs ED Triage Vitals  Enc Vitals Group     BP 04/06/20 1713 120/77     Pulse Rate 04/06/20 1713 92     Resp 04/06/20 1713 19     Temp 04/06/20 1713 98.3 F (36.8 C)     Temp Source 04/06/20 1713 Oral     SpO2 04/06/20 1713 95 %     Weight --      Height --      Head Circumference --      Peak Flow --      Pain Score 04/06/20 1714 0     Pain Loc --      Pain Edu? --      Excl. in [place]? --    No data found.  Updated Vital Signs BP 120/77 (BP Location: Right Arm)   Pulse 92   Temp 98.3 F (36.8 C) (Oral)   Resp 19   SpO2 95%   Visual Acuity Right Eye Distance:   Left Eye Distance:   Bilateral Distance:    Right Eye Near:   Left Eye Near:    Bilateral Near:     Physical Exam Vitals and nursing note reviewed.  Constitutional:      Appearance: She is well-developed.  HENT:     Head: Normocephalic.  Cardiovascular:     Rate and Rhythm: Normal rate.  Pulmonary:     Effort: Pulmonary  effort is normal.  Abdominal:     General: There is no distension.  Musculoskeletal:        General: Normal range of motion.     Cervical back: Normal range of motion.  Skin:    Comments: Patient has redness to her chest upper arms and legs patient has red areas look like rash do not blanch  Neurological:     General: No focal deficit present.     Mental Status: She is alert and oriented to person, place, and time.      UC Treatments / Results  Labs (all labs ordered are listed, but only abnormal results are displayed) Labs Reviewed - No data to display  EKG   Radiology No results found.  Procedures Procedures (  including critical care time)  Medications Ordered in UC Medications - No data to display  Initial Impression / Assessment and Plan / UC Course  I have reviewed the triage vital signs and the nursing notes.  Pertinent labs & imaging results that were available during my care of the patient were reviewed by me and considered in my medical decision making (see chart for details).     MDM: Patient is given a prescription for prednisone she is advised to avoid sun exposure.  I think the rash is secondary to sunburn.  Patient is advised if symptoms persist after finishing prednisone she should follow-up with her primary care physician for recheck Final Clinical Impressions(s) / UC Diagnoses   Final diagnoses:  Rash     Discharge Instructions     See your Physician for recheck next week.     ED Prescriptions    Medication Sig Dispense Auth. Provider   predniSONE (DELTASONE) 10 MG tablet 6,5,4,3,2,1 taper 21 tablet Fransico Meadow, [place]     PDMP not reviewed this encounter.   Fransico Meadow, [place] 04/06/20 1821

## 2020-04-06 NOTE — Discharge Instructions (Signed)
See your Physician for recheck next week  °

## 2020-04-07 ENCOUNTER — Telehealth: Payer: Self-pay

## 2020-04-07 NOTE — Telephone Encounter (Signed)
-----   Message from Gatha Mayer, MD sent at 04/07/2020  7:53 AM EDT ----- Regarding: needs appt For some reason cannot do the replies etc w/ my Mac  Re: her patient message - what she is asking for will need an OV w/ me or Nevin Bloodgood (has seen her several times  Thanks

## 2020-04-07 NOTE — Telephone Encounter (Signed)
Left message for patient to call back  

## 2020-04-12 NOTE — Telephone Encounter (Signed)
No return calls from the patient 

## 2020-04-15 ENCOUNTER — Other Ambulatory Visit: Payer: Self-pay | Admitting: Hematology

## 2020-04-15 ENCOUNTER — Encounter: Payer: Self-pay | Admitting: Hematology

## 2020-04-15 DIAGNOSIS — Z1231 Encounter for screening mammogram for malignant neoplasm of breast: Secondary | ICD-10-CM

## 2020-04-19 ENCOUNTER — Other Ambulatory Visit: Payer: Self-pay

## 2020-04-19 DIAGNOSIS — D229 Melanocytic nevi, unspecified: Secondary | ICD-10-CM

## 2020-04-19 DIAGNOSIS — Z853 Personal history of malignant neoplasm of breast: Secondary | ICD-10-CM

## 2020-04-21 DIAGNOSIS — L820 Inflamed seborrheic keratosis: Secondary | ICD-10-CM | POA: Diagnosis not present

## 2020-04-21 DIAGNOSIS — D225 Melanocytic nevi of trunk: Secondary | ICD-10-CM | POA: Diagnosis not present

## 2020-04-21 DIAGNOSIS — D2261 Melanocytic nevi of right upper limb, including shoulder: Secondary | ICD-10-CM | POA: Diagnosis not present

## 2020-04-21 DIAGNOSIS — L821 Other seborrheic keratosis: Secondary | ICD-10-CM | POA: Diagnosis not present

## 2020-04-21 DIAGNOSIS — D692 Other nonthrombocytopenic purpura: Secondary | ICD-10-CM | POA: Diagnosis not present

## 2020-04-21 DIAGNOSIS — D1801 Hemangioma of skin and subcutaneous tissue: Secondary | ICD-10-CM | POA: Diagnosis not present

## 2020-04-23 ENCOUNTER — Other Ambulatory Visit: Payer: Self-pay | Admitting: Family Medicine

## 2020-04-23 DIAGNOSIS — S299XXA Unspecified injury of thorax, initial encounter: Secondary | ICD-10-CM

## 2020-04-24 ENCOUNTER — Encounter: Payer: Self-pay | Admitting: Family Medicine

## 2020-04-25 NOTE — Telephone Encounter (Signed)
Requesting: ativan Contract:n/a UDS:n/a Last Visit:02/23/20 Next Visit:n/a Last Refill:12/09/19  Please Advise

## 2020-05-10 ENCOUNTER — Encounter: Payer: Self-pay | Admitting: Family Medicine

## 2020-05-11 ENCOUNTER — Other Ambulatory Visit: Payer: Self-pay | Admitting: *Deleted

## 2020-05-11 DIAGNOSIS — W5503XA Scratched by cat, initial encounter: Secondary | ICD-10-CM

## 2020-05-19 ENCOUNTER — Ambulatory Visit
Admission: RE | Admit: 2020-05-19 | Discharge: 2020-05-19 | Disposition: A | Discharge status: Home / Self Care | Payer: PPO | Source: Ambulatory Visit | Attending: Hematology | Admitting: Hematology

## 2020-05-19 ENCOUNTER — Encounter: Payer: Self-pay | Admitting: Family Medicine

## 2020-05-19 ENCOUNTER — Other Ambulatory Visit: Payer: Self-pay

## 2020-05-19 DIAGNOSIS — Z1231 Encounter for screening mammogram for malignant neoplasm of breast: Secondary | ICD-10-CM | POA: Diagnosis not present

## 2020-05-20 ENCOUNTER — Telehealth: Payer: Self-pay

## 2020-05-20 NOTE — Telephone Encounter (Signed)
Anne [name] called requesting mammogram results. Forwarded request to Dr. Burr Medico

## 2020-05-20 NOTE — Telephone Encounter (Signed)
Anne [name] states that previous chest rash has returned and has spread to her other breast.  There are hard lumps that are painful with palpated. Dr. Burr Medico  notified

## 2020-05-24 NOTE — Progress Notes (Signed)
Anne [name]   [phone] [phone]   Clinic Follow up Note   Patient Care Team: [name], MD as PCP - General (Family Medicine) Fay Records, MD as PCP - Cardiology (Cardiology) Paralee Cancel, MD as Consulting Physician (Orthopedic Surgery) Gatha Mayer, MD as Consulting Physician (Gastroenterology) Clent Jacks, MD as Consulting Physician (Ophthalmology) Roney Jaffe, DDS (Oral Surgery) Everlene Other (Dentistry) Haverstock, Jennefer Bravo, MD as Referring Physician (Dermatology) Dillingham, Loel Lofty, DO as Attending Physician (Plastic Surgery) Truitt Merle, MD as Consulting Physician (Hematology) Date of service 05/25/2020  CHIEF COMPLAINT: Right chest/breast skin rash  SUMMARY OF ONCOLOGIC HISTORY: Oncology History  Breast cancer, right breast ([place])  01/2000 Initial Diagnosis   Breast cancer, right breast ([place])  History of primary lobular right breast carcinoma. 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 I believe that she had local radiation, tho that information is not available in this EMR.   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 2008 (tho patient did not agree to surgery until July 2008), invasive lobular carcinoma ER 81%, PR 96%, Her 2 1+ ([id number]) , 1.5 cm invasive lobular with 3 axillary nodes negative, LVSI and perineural invasion present ([id number]). She subsequently declined adjuvant systemic treatment . She had a complicated course following right mastectomy, with difficult healing after right breast reconstruction.    07/2009  Relapse/Recurrence   She had skin recurrence lateral right chest wall excised in Sept 2010 then treated with xeloda from Oct 2010 thru Feb 2011 and began on Arimidex July 2011; she may have stopped Arimidex after bone density scan 02-2014, or possibly the year prior (?).    2015 Imaging   Bone density scan [facility] 02-15-14 still osteopenic range, slightly lower in LS compared with 2013 and stable in hips.      INTERVAL HISTORY: Anne [name] presents for symptom management visit for worsening right chest/breast rash.  This initially presented months ago that was managed with topical hydrocortisone.  She saw improvement until 10-14 days ago when the rash returned/worsened.  The area is warm and tender to touch.  Denies fever, chills, itching, drainage.  She uses a" high power detergent" with her clothes and bras, otherwise denies new soap, lotion, perfume, or new medication.  Denies environmental exposures.   Additionally, she saw pulmonologist Dr. Vaughan Browner today for progressive dyspnea.  She also notes fatigue.  She is taking oral iron but wonders if it is not enough.  She had one episode of bleeding with wiping in the setting of known constipation.  She otherwise denies new bone/joint pain, abdominal pain, unintentional weight loss, or other new concerns.   MEDICAL HISTORY:  Past Medical History:  Diagnosis Date   ALKALINE PHOSPHATASE, ELEVATED 03/15/2009   Allergic state 06/10/2012   Allergy    Anemia    Anxiety and depression 04/28/2011   Arthritis    Asthma    Atypical chest pain 11/30/2011   AVM (arteriovenous malformation) of colon 2011   cecum   Baker's cyst of knee 05/22/2011   Cancer ([place]) 01,  08   XRT/chemo 01-02/ lobular invasive ca   Carotid artery  disease ([place])    a. Carotid duplex 03/2014: stable 1-39% BICA, f/u due 03/2016.   Chronic alcoholism in remission ([place]) 03/29/2011   Did not tolerate Klonopin, caused some confusion and bad dreams.     Clotting disorder (H[facility])     COPD (chronic obstructive pulmonary disease) (HCC)    Dermatitis 3/61/4431   EE (eosinophilic esophagitis)    Emphysema of lung (H[facility])    Esophageal ring    ESOPHAGEAL STRICTURE 03/29/2009   Fall 11/23/2012   Family history of breast cancer    Family history of colon cancer    Family history of ovarian cancer    Family history of pancreatic cancer    Folliculitis of nose 5/40/0867   GERD (gastroesophageal reflux disease) 09/29/2009   improved s/p cholecystectomy and esophagus dilatation   History of chicken pox    History of measles    History of shingles    2 episodes   Hx of echocardiogram    a. Echo 01/2013: mild LVH, EF 55-60%, normal wall motion, Gr 1 diast dysfn   Hyperlipidemia    Knee pain, bilateral 07/23/2011   Medicare annual wellness visit, subsequent 06/19/2015   Mixed hyperlipidemia 10/17/2010   Qualifier: Diagnosis of  By: Mack Guise     Orthostasis    Osteopenia 03/14/2011   Osteoporosis    Personal history of chemotherapy 2001   Personal history of radiation therapy 2001   rt breast   PERSONAL HX BREAST CANCER 09/29/2009   PVC's (premature ventricular contractions)    a. Event monitor 01/2013: NSR, extensive PVCs.   Radial neck fracture 10/2011   minimally displaced   Substance abuse ([place])    In remission 3 years.    Urinary incontinence 03/19/2012   Vaginitis 05/22/2011    SURGICAL HISTORY: Past Surgical History:  Procedure Laterality Date   APPENDECTOMY     AUGMENTATION MAMMAPLASTY Right 05/27/2007   BREAST BIOPSY Right 01/02/2007   wire loc   BREAST BIOPSY  12/26/2006   BREAST LUMPECTOMY Right 2001   BREAST RECONSTRUCTION  2008, 2009, 2010   BREAST REDUCTION WITH MASTOPEXY Left 05/30/2017   Procedure: LEFT BREAST REDUCTION FOR SYMTERY WITH MASTOPEXY;  Surgeon: Wallace Going, DO;  Location: [facility];  Service: Plastics;  Laterality: Left;   CHOLECYSTECTOMY  2010   COLONOSCOPY  09/05/10    [facility]   DENTAL SURGERY  05/2016   4 dental implants by Dr. Loyal Gambler.   ERCP  2010    CBD stone extraction    ESOPHAGOGASTRODUODENOSCOPY  01/08/2012   Procedure: ESOPHAGOGASTRODUODENOSCOPY (EGD);  Surgeon: Gatha Mayer, MD;  Location: [facility];  Service: Endoscopy;  Laterality: N/A;   ESOPHAGOGASTRODUODENOSCOPY (EGD) WITH ESOPHAGEAL DILATION  2010, 2012   LAPAROSCOPIC APPENDECTOMY N/A 08/04/2016   Procedure: APPENDECTOMY LAPAROSCOPIC;  Surgeon: Michael Boston, MD;  Location: [facility];  Service: General;  Laterality: N/A;   LIPOSUCTION WITH LIPOFILLING Left 11/21/2017   Procedure: LIPOSUCTION FROM ABDOMEN WITH LIPOFILLING TO LEFT BREAST;  Surgeon: Wallace Going, DO;  Location: [facility];  Service: Plastics;  Laterality: Left;   MASTECTOMY MODIFIED RADICAL Right 05/27/2007   , Mastectomy modified radical (08), breast reconstruction, CA lesions excised lateral abd wall 2010   MASTOPEXY Left 11/21/2017   Procedure: LEFT BREAST REVISION MASTOPEXY FOR SYMMETRY;  Surgeon: Wallace Going, DO;  Location: [facility];  Service: Plastics;  Laterality: Left;   REDUCTION MAMMAPLASTY Left    SAVORY DILATION  01/08/2012   Procedure:  SAVORY DILATION;  Surgeon: Gatha Mayer, MD;  Location: [facility];  Service: Endoscopy;  Laterality: N/A;  need xray    I have reviewed the social history and family history with the patient and they are unchanged from previous note.  ALLERGIES:  is allergic to codeine, morphine, peanut-containing drug products, sorbitol, tomato, zofran, advil [ibuprofen], diphenhydramine hcl, and oxycodone.  MEDICATIONS:  Current Outpatient Medications  Medication Sig Dispense Refill   acetaminophen (TYLENOL) 500 MG tablet Take 500-1,000 mg by mouth every 6 (six) hours as needed (for pain.).     amitriptyline (ELAVIL) 25 MG tablet TAKE 6 TABLETS(150 MG) BY MOUTH AT BEDTIME 180 tablet 2   atorvastatin (LIPITOR) 20 MG tablet Take  1 tablet (20 mg total) by mouth 2 (two) times daily. 180 tablet 3   Biotin (BIOTIN 5000) 5 MG CAPS Take by mouth.     Cholecalciferol (VITAMIN D3) 1000 UNITS CAPS Take 2,000 Units by mouth daily.      Cyanocobalamin (VITAMIN B 12 PO) Take 1 tablet by mouth daily.     diltiazem (CARDIZEM) 30 MG tablet TAKE 1 TABLET BY MOUTH EVERY DAY FOR PALPITATIONS 90 tablet 2   Ferrous Fumarate-Folic Acid 315-1 MG TABS Take 1 tablet by mouth daily. 30 each 3   loratadine (CLARITIN) 10 MG tablet Take 1 tablet (10 mg total) by mouth daily. 30 tablet 11   LORazepam (ATIVAN) 1 MG tablet TAKE 1 TABLET(1 MG) BY MOUTH EVERY 8 HOURS AS NEEDED FOR ANXIETY 70 tablet 1   pantoprazole (PROTONIX) 40 MG tablet TAKE 1 TABLET(40 MG) BY MOUTH DAILY 90 tablet 1   permethrin (ELIMITE) 5 % cream Apply from scalp to toes, avoid face. Leave on 8-14 hours. Thoroughly rinse with soap and water. Repeat in 2 weeks if needed. 60 g 1   rOPINIRole (REQUIP) 0.25 MG tablet Take 1 tablet (0.25 mg total) by mouth at bedtime. 30 tablet 0   triamcinolone cream (KENALOG) 0.1 % Apply topically 2 (two) times daily. Apply to affected area 45 g 1   No current facility-administered medications for this visit.    PHYSICAL EXAMINATION: ECOG PERFORMANCE STATUS: 1 - Symptomatic but completely ambulatory  Vitals:   05/25/20 1325  BP: 133/81  Pulse: 93  Resp: 18  Temp: (!) 97.5 F (36.4 C)  SpO2: 96%   Filed Weights   05/25/20 1325  Weight: 141 lb 12.8 oz (64.3 kg)    GENERAL:alert, no distress and comfortable EYES:  sclera clear NECK: Without mass LYMPH:  no palpable cervical, supraclavicular, or axillary lymphadenopathy  LUNGS: clear with normal breathing effort HEART: regular rate & rhythm, no lower extremity edema Musculoskeletal: No focal spinal tenderness NEURO: alert & oriented x 3 with fluent speech, no focal motor/sensory deficits Breast: s/p right mastectomy with implant and skin recurrence resection scar to right  chest wall/low axilla. There is a scattered raised rash with mild erythema and warmth over the right implant/breast extending over the sternum. No palpable mass in chest wall, left breast or bilateral axilla that I could appreciate. See image below        LABORATORY DATA:  I have reviewed the data as listed CBC Latest Ref Rng & Units 05/25/2020 03/14/2020 12/11/2019  WBC 4.0 - 10.5 K/uL 7.0 6.5 5.5  Hemoglobin 12.0 - 15.0 g/dL 12.4 12.2 12.1  Hematocrit 36 - 46 % 39.0 38.2 37.4  Platelets 150 - 400 K/uL 251 264 281     CMP Latest Ref Rng & Units 05/25/2020 12/11/2019  08/06/2019  Glucose 70 - 99 mg/dL 95 98 88  BUN 8 - 23 mg/dL 16 16 19   Creatinine 0.44 - 1.00 mg/dL 0.78 0.74 0.63  Sodium 135 - 145 mmol/L 142 143 140  Potassium 3.5 - 5.1 mmol/L 3.8 3.8 4.5  Chloride 98 - 111 mmol/L 104 105 100  CO2 22 - 32 mmol/L 27 30 31   Calcium 8.9 - 10.3 mg/dL 9.2 9.3 9.9  Total Protein 6.5 - 8.1 g/dL 7.3 7.0 7.0  Total Bilirubin 0.3 - 1.2 mg/dL 0.2(L) 0.4 0.3  Alkaline Phos 38 - 126 U/L 106 94 96  AST 15 - 41 U/L 21 18 19   ALT 0 - 44 U/L 19 21 15       RADIOGRAPHIC STUDIES: I have personally reviewed the radiological images as listed and agreed with the findings in the report. No results found.   ASSESSMENT & PLAN: 79 year old female with  1. Right breast skin eruption   2. 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  Followed by Dr. Vaughan Browner pulmonology  3. History of recurrent right breast lobular carcinoma diagnosed in 01/2000; recurrence in 2008 and chest wall recurrence in 2010 -S/p right mastectomy with reconstruction/implant; treated with Xeloda and exemestane in 2011-2015 for chest wall recurrence -Currently on surveillance  Disposition: Anne [name] appears stable.  The etiology of her right chest/breast skin eruption is unknown.  It did initially improve with steroids, therefore we  are recommending to try triamcinolone.  Low suspicion for cellulitis given no fever/chills and normal CBC.  Left screening mammogram on 05/19/2020 was negative.  Given her history of lobular carcinoma with skin/chest wall recurrence in 2010, we are referring her back to dermatology for skin biopsy.   She is followed by Dr. Vaughan Browner for radiation fibrosis and emphysema.  He felt her dyspnea and fatigue might be related to weight gain and deconditioning and possibly IDA.  Iron studies today show normal serum iron, TIBC, transferrin saturation and low-normal ferritin of 47, she is not anemic.  I recommend for her to increase oral iron to twice daily to get ferritin above 50.  Continue follow-up with pulmonology.  If her biopsy is negative for metastatic breast cancer to skin, she will follow-up with dermatology for the rash and we will see her back in 09/2020 for routine follow-up as planned.  The patient was seen with Dr. Burr Medico today.  I will fax my note to her dermatologist Dr. Camillo Flaming.  All questions were answered. The patient knows to call the clinic with any problems, questions or concerns. No barriers to learning were detected.     Alla Feeling, NP 05/26/20   Addendum  I have seen the patient, examined her. I agree with the assessment and and plan and have edited the notes.   I am not sure what causes her skin rash/lesion on right up chest, although my suspicion of metastatic breast cancer to skin is not high, I recommend a skin biopsy to rule this out and make a more definitive diagnosis. She will contact her dermatologist for the biopsy. We will f/u on biopsy result.   All questions were answered.   Truitt Merle  05/25/2020

## 2020-05-25 ENCOUNTER — Inpatient Hospital Stay: Payer: PPO

## 2020-05-25 ENCOUNTER — Ambulatory Visit: Payer: PPO | Admitting: Pulmonary Disease

## 2020-05-25 ENCOUNTER — Encounter: Payer: Self-pay | Admitting: Pulmonary Disease

## 2020-05-25 ENCOUNTER — Ambulatory Visit (INDEPENDENT_AMBULATORY_CARE_PROVIDER_SITE_OTHER)
Admission: RE | Admit: 2020-05-25 | Discharge: 2020-05-25 | Disposition: A | Discharge status: Home / Self Care | Payer: PPO | Source: Ambulatory Visit | Attending: Pulmonary Disease | Admitting: Pulmonary Disease

## 2020-05-25 ENCOUNTER — Other Ambulatory Visit: Payer: PPO

## 2020-05-25 ENCOUNTER — Other Ambulatory Visit: Payer: Self-pay

## 2020-05-25 ENCOUNTER — Inpatient Hospital Stay: Payer: PPO | Attending: Hematology | Admitting: Nurse Practitioner

## 2020-05-25 ENCOUNTER — Encounter: Payer: Self-pay | Admitting: Nurse Practitioner

## 2020-05-25 VITALS — BP 116/60 | HR 94 | Temp 98.4°F | Ht 65.5 in | Wt 142.6 lb

## 2020-05-25 DIAGNOSIS — Z923 Personal history of irradiation: Secondary | ICD-10-CM | POA: Diagnosis not present

## 2020-05-25 DIAGNOSIS — L309 Dermatitis, unspecified: Secondary | ICD-10-CM | POA: Diagnosis not present

## 2020-05-25 DIAGNOSIS — J439 Emphysema, unspecified: Secondary | ICD-10-CM

## 2020-05-25 DIAGNOSIS — R0602 Shortness of breath: Secondary | ICD-10-CM

## 2020-05-25 DIAGNOSIS — Z800 Family history of malignant neoplasm of digestive organs: Secondary | ICD-10-CM | POA: Insufficient documentation

## 2020-05-25 DIAGNOSIS — J449 Chronic obstructive pulmonary disease, unspecified: Secondary | ICD-10-CM | POA: Diagnosis not present

## 2020-05-25 DIAGNOSIS — Z853 Personal history of malignant neoplasm of breast: Secondary | ICD-10-CM | POA: Diagnosis not present

## 2020-05-25 DIAGNOSIS — D649 Anemia, unspecified: Secondary | ICD-10-CM

## 2020-05-25 DIAGNOSIS — Z9221 Personal history of antineoplastic chemotherapy: Secondary | ICD-10-CM | POA: Insufficient documentation

## 2020-05-25 DIAGNOSIS — Z803 Family history of malignant neoplasm of breast: Secondary | ICD-10-CM | POA: Insufficient documentation

## 2020-05-25 DIAGNOSIS — Z79899 Other long term (current) drug therapy: Secondary | ICD-10-CM | POA: Insufficient documentation

## 2020-05-25 LAB — FERRITIN: Ferritin: 47 ng/mL (ref 11–307)

## 2020-05-25 LAB — CBC WITH DIFFERENTIAL (CANCER CENTER ONLY)
Abs Immature Granulocytes: 0.01 10*3/uL (ref 0.00–0.07)
Basophils Absolute: 0.1 10*3/uL (ref 0.0–0.1)
Basophils Relative: 1 %
Eosinophils Absolute: 0.2 10*3/uL (ref 0.0–0.5)
Eosinophils Relative: 3 %
HCT: 39 % (ref 36.0–46.0)
Hemoglobin: 12.4 g/dL (ref 12.0–15.0)
Immature Granulocytes: 0 %
Lymphocytes Relative: 25 %
Lymphs Abs: 1.8 10*3/uL (ref 0.7–4.0)
MCH: 30.8 pg (ref 26.0–34.0)
MCHC: 31.8 g/dL (ref 30.0–36.0)
MCV: 97 fL (ref 80.0–100.0)
Monocytes Absolute: 0.6 10*3/uL (ref 0.1–1.0)
Monocytes Relative: 9 %
Neutro Abs: 4.3 10*3/uL (ref 1.7–7.7)
Neutrophils Relative %: 62 %
Platelet Count: 251 10*3/uL (ref 150–400)
RBC: 4.02 MIL/uL (ref 3.87–5.11)
RDW: 12.8 % (ref 11.5–15.5)
WBC Count: 7 10*3/uL (ref 4.0–10.5)
nRBC: 0 % (ref 0.0–0.2)

## 2020-05-25 LAB — CMP (CANCER CENTER ONLY)
ALT: 19 U/L (ref 0–44)
AST: 21 U/L (ref 15–41)
Albumin: 3.7 g/dL (ref 3.5–5.0)
Alkaline Phosphatase: 106 U/L (ref 38–126)
Anion gap: 11 (ref 5–15)
BUN: 16 mg/dL (ref 8–23)
CO2: 27 mmol/L (ref 22–32)
Calcium: 9.2 mg/dL (ref 8.9–10.3)
Chloride: 104 mmol/L (ref 98–111)
Creatinine: 0.78 mg/dL (ref 0.44–1.00)
GFR, Est AFR Am: >60 mL/min (ref >60)
GFR, Estimated: >60 mL/min (ref >60)
Glucose, Bld: 95 mg/dL (ref 70–99)
Potassium: 3.8 mmol/L (ref 3.5–5.1)
Sodium: 142 mmol/L (ref 135–145)
Total Bilirubin: 0.2 mg/dL — ABNORMAL LOW (ref 0.3–1.2)
Total Protein: 7.3 g/dL (ref 6.5–8.1)

## 2020-05-25 LAB — IRON AND TIBC
Iron: 73 ug/dL (ref 41–142)
Saturation Ratios: 23 % (ref 21–57)
TIBC: 325 ug/dL (ref 236–444)
UIBC: 252 ug/dL (ref 120–384)

## 2020-05-25 MED ORDER — TRIAMCINOLONE ACETONIDE 0.1 % EX CREA: TOPICAL_CREAM | Freq: Two times a day (BID) | CUTANEOUS | 1 refills | Status: DC

## 2020-05-25 NOTE — Progress Notes (Deleted)
Anne [name]    [id number]    03-10-1941  Primary Care Physician:[name], [name], MD  Referring Physician: [name], MD [address] [place],  [place] [address]  Chief complaint: Follow-up for emphysema, COPD   HPI: 79 year old with history of breast cancer, emphysema, eosinophilic esophagitis, palpitations, hyperlipidemia Complains of progressive dyspnea on exertion for the past 2 years.  She does not have symptoms at rest.  Denies any cough, sputum production, wheezing.  She is on albuterol which she is using a couple of times per week.  She has significant seasonal allergies, notes episodes of increasing dyspnea, tightness after working in her yard.  She also has acid reflux, eosinophilic esophagitis and follows with Dr. [name].  She is on budesonide, PPI therapy for this. Started on Symbicort at last visit in June 2018.  She stopped using it after few weeks as it made her hoarse.   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 Smoking history: 50-70-pack-year smoking history.  Quit in 2008. Travel history: No significant travel.  Interim history: Breathing is doing well.  She continues on iron therapy with improvement in ferritin levels and hemoglobin States that this has improved her breathing more than any other inhaler She has stopped using the Breo without any worsening of breathing.  Outpatient Encounter Medications as of 05/25/2020  Medication Sig  . acetaminophen (TYLENOL) 500 MG tablet Take 500-1,000 mg by mouth every 6 (six) hours as needed (for pain.).  [name] amitriptyline (ELAVIL) 25 MG tablet TAKE 6 TABLETS(150 MG) BY MOUTH AT BEDTIME  . atorvastatin (LIPITOR) 20 MG tablet Take 1 tablet (20 mg total) by mouth 2 (two)  times daily.  . Biotin (BIOTIN 5000) 5 MG CAPS Take by mouth.  . Cholecalciferol (VITAMIN D3) 1000 UNITS CAPS Take 2,000 Units by mouth daily.   . Cyanocobalamin (VITAMIN B 12 PO) Take 1 tablet by mouth daily.  [name] diltiazem (CARDIZEM) 30 MG tablet TAKE 1 TABLET BY MOUTH EVERY DAY FOR PALPITATIONS  . Ferrous Fumarate-Folic Acid 270-3 MG TABS Take 1 tablet by mouth daily.  [name] loratadine (CLARITIN) 10 MG tablet Take 1 tablet (10 mg total) by mouth daily.  [name] LORazepam (ATIVAN) 1 MG tablet TAKE 1 TABLET(1 MG) BY MOUTH EVERY 8 HOURS AS NEEDED FOR ANXIETY  . pantoprazole (PROTONIX) 40 MG tablet TAKE 1 TABLET(40 MG) BY MOUTH DAILY  . permethrin (ELIMITE) 5 % cream Apply from scalp to toes, avoid face. Leave on 8-14 hours. Thoroughly rinse with soap and water. Repeat in 2 weeks if needed.  [name] rOPINIRole (REQUIP) 0.25 MG tablet Take 1 tablet (0.25 mg total) by mouth at bedtime.  . triamcinolone cream (KENALOG) 0.1 % Apply topically 2 (two) times daily. Apply to affected area  . [DISCONTINUED] predniSONE (DELTASONE) 10 MG tablet 6,5,4,3,2,1 taper   No facility-administered encounter medications on file as of 05/25/2020.   Physical Exam: Blood pressure 112/62, pulse 99, height 5' 5.5" (1.664 m), weight 138 lb 9.6 oz (62.9 kg), SpO2 97 %. Gen:      No acute distress HEENT:  EOMI, sclera anicteric Neck:     No masses; no thyromegaly Lungs:    Clear to auscultation bilaterally; normal respiratory effort CV:  Regular rate and rhythm; no murmurs Abd:      + bowel sounds; soft, non-tender; no palpable masses, no distension Ext:    No edema; adequate peripheral perfusion Skin:      Warm and dry; no rash Neuro: alert and oriented x 3 Psych: normal mood and affect  Data Reviewed: Imaging CT chest 07/03/2013- postradiation changes in the right upper lobe, centrilobular emphysema, nodular thickening of the pleura at the bases CT abdomen 08/10/17- visualized lung bases show minimal atelectasis, no  interstitial lung disease. CT high-resolution 08/12/2018-moderate emphysema, mild radiation fibrosis in the apical right upper lobe, two-vessel coronary atherosclerosis. I reviewed the images personally.  Labs CBC 04/16/2018-WBC 8.6, eos 1.8%, absolute eosinophil count 155 Allergy profile 05/06/1989-IgE 271, sensitive to dust mite.  Mild allergies to cat, dog, tree pollen  PFTs Spirometry  09/13/2009 FVC 1.65 (49%], FEV1 1.39 (55%], F/F 84  08/11/2018 FVC 2.3 [77%), FEV1 2.3 [100%), F/F 97 Normal test.  PFTs 09/09/2018 FVC 2.62 [1%), FEV1 2.07 [92%), F/F 79, TLC 56%, DLCO 48%, DLCO/VA 76%  FENO 08/11/2018-28  Assessment:  Emphysema, dyspnea Lung imaging reviewed with minimal scarring in the right upper lobe consistent with postradiation changes and moderate emphysema.  No evidence of interstitial lung disease. There are minimal obstructive changes on pulmonary function tests  Symptoms overall have improved over the past few months.  In retrospect her symptoms of dyspnea were likely secondary to iron deficiency anemia as inhalers have not made any difference with her breathing Suspect there is a component of deconditioning contributing to her symptoms Encouraged her to start an exercise program at home and stay active.  Plan/Recommendations: - Observe off inhalers. - Start exercise therapy  Marshell Garfinkel MD [facility] 05/25/2020, 11:17 AM  CC: [name], MD

## 2020-05-25 NOTE — Progress Notes (Signed)
Anne [name]    [id number]    05-Oct-1941  Primary Care Physician:[name], [name], MD  Referring Physician: [name], MD [address] [place],   [id number]  Chief complaint: Follow-up for emphysema, COPD   HPI: 79 year old with history of breast cancer, emphysema, eosinophilic esophagitis, palpitations, hyperlipidemia Complains of progressive dyspnea on exertion for the past 2 years.  She does not have symptoms at rest.  Denies any cough, sputum production, wheezing.  She is on albuterol which she is using a couple of times per week.  She has significant seasonal allergies, notes episodes of increasing dyspnea, tightness after working in her yard.  She also has acid reflux, eosinophilic esophagitis and follows with Dr. [name].  She is on budesonide, PPI therapy for this. Started on Symbicort at last visit in June 2018.  She stopped using it after few weeks as it made her hoarse.   She was then prescribed Trelegy inhaler but stopped it after a few days as it was not making a difference.  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 Smoking history: 50-70-pack-year smoking history.  Quit in 2008. Travel history: No significant travel.  Interim history: Currently off all inhalers.  She has remained at home and largely sedentary during the COVID-19 pandemic with increase in weight. Reports mild increase in dyspnea on exertion.  No cough or sputum production no fevers or chills or wheezing.  Outpatient Encounter Medications as of 05/25/2020  Medication Sig  . acetaminophen (TYLENOL) 500 MG tablet Take 500-1,000 mg by mouth every 6 (six) hours as needed (for pain.).  [name] amitriptyline (ELAVIL) 25 MG tablet TAKE 6  TABLETS(150 MG) BY MOUTH AT BEDTIME  . atorvastatin (LIPITOR) 20 MG tablet Take 1 tablet (20 mg total) by mouth 2 (two) times daily.  . Biotin (BIOTIN 5000) 5 MG CAPS Take by mouth.  . Cholecalciferol (VITAMIN D3) 1000 UNITS CAPS Take 2,000 Units by mouth daily.   . Cyanocobalamin (VITAMIN B 12 PO) Take 1 tablet by mouth daily.  [name] diltiazem (CARDIZEM) 30 MG tablet TAKE 1 TABLET BY MOUTH EVERY DAY FOR PALPITATIONS  . Ferrous Fumarate-Folic Acid 366-2 MG TABS Take 1 tablet by mouth daily.  [name] loratadine (CLARITIN) 10 MG tablet Take 1 tablet (10 mg total) by mouth daily.  [name] LORazepam (ATIVAN) 1 MG tablet TAKE 1 TABLET(1 MG) BY MOUTH EVERY 8 HOURS AS NEEDED FOR ANXIETY  . pantoprazole (PROTONIX) 40 MG tablet TAKE 1 TABLET(40 MG) BY MOUTH DAILY  . permethrin (ELIMITE) 5 % cream Apply from scalp to toes, avoid face. Leave on 8-14 hours. Thoroughly rinse with soap and water. Repeat in 2 weeks if needed.  [name] rOPINIRole (REQUIP) 0.25 MG tablet Take 1 tablet (0.25 mg total) by mouth at bedtime.  . triamcinolone cream (KENALOG) 0.1 % Apply topically 2 (two) times daily. Apply to affected area  . [DISCONTINUED] predniSONE (DELTASONE) 10 MG tablet 6,5,4,3,2,1 taper   No facility-administered encounter medications on file as of 05/25/2020.   Physical Exam: Blood pressure 116/60, pulse 94, temperature 98.4 F (36.9 C), temperature source Oral, height 5' 5.5" (1.664 m), weight 142 lb 9.6 oz (64.7 kg), SpO2 95 %. Gen:      No acute distress HEENT:  EOMI, sclera anicteric Neck:     No masses; no thyromegaly Lungs:    Clear to auscultation bilaterally; normal respiratory effort CV:         Regular rate and rhythm; no murmurs Abd:      + bowel sounds; soft, non-tender; no palpable masses, no distension Ext:    No edema; adequate peripheral perfusion Skin:      Warm and dry; no rash Neuro: alert and oriented x 3 Psych: normal mood and affect  Data Reviewed: Imaging CT chest 07/03/2013- postradiation changes  in the right upper lobe, centrilobular emphysema, nodular thickening of the pleura at the bases CT abdomen 08/10/17- visualized lung bases show minimal atelectasis, no interstitial lung disease. CT high-resolution 08/12/2018-moderate emphysema, mild radiation fibrosis in the apical right upper lobe, two-vessel coronary atherosclerosis. I reviewed the images personally.  Labs CBC 04/16/2018-WBC 8.6, eos 1.8%, absolute eosinophil count 155 Allergy profile 05/06/1989-IgE 271, sensitive to dust mite.  Mild allergies to cat, dog, tree pollen  PFTs Spirometry  09/13/2009 FVC 1.65 (49%], FEV1 1.39 (55%], F/F 84  08/11/2018 FVC 2.3 [77%), FEV1 2.3 [100%), F/F 97 Normal test.  PFTs 09/09/2018 FVC 2.62 [1%), FEV1 2.07 [92%), F/F 79, TLC 56%, DLCO 48%, DLCO/VA 76%  FENO 08/11/2018-28  Assessment:  Emphysema, dyspnea Lung imaging reviewed with minimal scarring in the right upper lobe consistent with postradiation changes and moderate emphysema.  No evidence of interstitial lung disease. There are minimal obstructive changes on pulmonary function tests  In retrospect her symptoms of dyspnea were likely secondary to iron deficiency anemia as inhalers have not made any difference with her breathing.  Now with increase in dyspnea on exertion after weight gain during the COVID-19 pandemic. Suspect there is a component of deconditioning contributing to her symptoms. Encouraged her to start an exercise program at home and stay active. Get chest x-ray and PFTs for reevaluation.  Plan/Recommendations: - Observe off inhalers. - Chest x-ray, PFTs - Start exercise therapy  Marshell Garfinkel MD [facility] 05/25/2020, 11:13 AM  CC: [name], MD

## 2020-05-25 NOTE — Patient Instructions (Signed)
We will get a chest x-ray and schedule pulmonary function test for evaluation of your lung since you are reporting increased shortness of breath on exertion Follow-up in clinic after these tests for review.

## 2020-05-25 NOTE — Addendum Note (Signed)
Addended by: Elton Sin on: 05/25/2020 11:36 AM   Modules accepted: Orders

## 2020-05-26 ENCOUNTER — Telehealth: Payer: Self-pay | Admitting: Nurse Practitioner

## 2020-05-26 ENCOUNTER — Encounter: Payer: Self-pay | Admitting: Nurse Practitioner

## 2020-05-26 NOTE — Telephone Encounter (Signed)
Dr. Vaughan Browner - please advise on CXR results. Thanks.

## 2020-05-26 NOTE — Telephone Encounter (Signed)
No 7/14 los

## 2020-05-27 ENCOUNTER — Encounter: Payer: Self-pay | Admitting: Hematology

## 2020-05-30 ENCOUNTER — Telehealth: Payer: Self-pay

## 2020-05-30 NOTE — Telephone Encounter (Signed)
05/25/20 chief complaint rash visit note faxed to dermatologist Dr. Camillo Flaming

## 2020-05-31 ENCOUNTER — Telehealth: Payer: Self-pay

## 2020-05-31 DIAGNOSIS — J439 Emphysema, unspecified: Secondary | ICD-10-CM

## 2020-05-31 NOTE — Telephone Encounter (Signed)
Spoke with patient updated her on new iron recommendation, encouraged her per provider to follow up with cxr results with Dr. Vaughan Browner and also verified derm referral was sent x2 referral faxed to [phone]

## 2020-06-01 NOTE — Telephone Encounter (Signed)
My chart message from patient.Please advise.  7.20.21 Dr. Vaughan Browner: Was I given an AAT Deficiency test?  My sister died at 60 due to primary biliary cirrhosis.   Thank you for your time.  Anne [name]

## 2020-06-01 NOTE — Telephone Encounter (Signed)
We have not checked if for you but can get them  Please order an A1AT test and phenotype

## 2020-06-03 DIAGNOSIS — C44591 Other specified malignant neoplasm of skin of breast: Secondary | ICD-10-CM | POA: Diagnosis not present

## 2020-06-06 ENCOUNTER — Other Ambulatory Visit: Payer: Self-pay

## 2020-06-06 ENCOUNTER — Encounter: Payer: Self-pay | Admitting: Primary Care

## 2020-06-06 ENCOUNTER — Ambulatory Visit: Payer: PPO | Admitting: Primary Care

## 2020-06-06 VITALS — BP 110/64 | HR 89 | Temp 98.6°F | Ht 65.5 in | Wt 140.4 lb

## 2020-06-06 DIAGNOSIS — J439 Emphysema, unspecified: Secondary | ICD-10-CM | POA: Diagnosis not present

## 2020-06-06 DIAGNOSIS — R0600 Dyspnea, unspecified: Secondary | ICD-10-CM

## 2020-06-06 DIAGNOSIS — J438 Other emphysema: Secondary | ICD-10-CM

## 2020-06-06 DIAGNOSIS — R0602 Shortness of breath: Secondary | ICD-10-CM

## 2020-06-06 MED ORDER — STIOLTO RESPIMAT 2.5-2.5 MCG/ACT IN AERS
2.0000 | INHALATION_SPRAY | Freq: Every day | RESPIRATORY_TRACT | 0 refills | Status: DC
Start: 2020-06-06 — End: 2020-07-05

## 2020-06-06 NOTE — Assessment & Plan Note (Signed)
-   Recommend repeat echocardiogram d/t dyspnea and murmur heard on exam - Echocardiogram in 2019 showed normal left ventricle ejection fraction and trivial Aortic regurgitation and mild abnormal relaxation

## 2020-06-06 NOTE — Progress Notes (Signed)
@Patient  ID: Anne [name], female    DOB: [birth year], 79 y.o.   MRN: [id number]  Chief Complaint  Patient presents with  . Acute Visit    Referring provider: [name], MD  HPI: 79 year old female, former smoker. PMH significant for emphysema, eosinophile esophagitis. Patient of Dr. Vaughan Browner, last seen on 05/25/20. Progressive dyspnea for past 2 years. No evidence of interstitial lung disease. Minimal obstruction changes on pulmonary function testing. CXR showed emphysema, recommend re-trial inhalers.    06/06/2020 Patient presents today for acute visit with reports of shortness of breath. Accompanied by her husband. Shortness of breath has been bothering her a little more since her last office visit with Dr. Vaughan Browner on 05/25/20. Reports associated upper airway congestion with frequent throat clearing.  Her cough is non-productive. She experiences shortness of breath on exertion. States that she can take a deep breath but has difficulty exhaling. She tells me that she was given an inhaler for COPD 2 years ago which she did not use because when she read the instructions it said that it should not be taken if patient has asthma. No chest tightens of wheezing.   Data Reviewed: Imaging CT chest 07/03/2013- postradiation changes in the right upper lobe, centrilobular emphysema, nodular thickening of the pleura at the bases CT abdomen 08/10/17- visualized lung bases show minimal atelectasis, no interstitial lung disease. CT high-resolution 08/12/2018-moderate emphysema, mild radiation fibrosis in the apical right upper lobe, two-vessel coronary atherosclerosis. I reviewed the images personally.  Labs CBC 04/16/2018-WBC 8.6, eos 1.8%, absolute eosinophil count 155 Allergy profile 05/06/1989-IgE 271, sensitive to dust mite.  Mild allergies to cat, dog, tree pollen  PFTs Spirometry  09/13/2009 FVC 1.65 (49%], FEV1 1.39 (55%], F/F 84  08/11/2018 FVC 2.3 [77%), FEV1 2.3 [100%), F/F 97 Normal  test.  PFTs 09/09/2018 FVC 2.62 [1%), FEV1 2.07 [92%), F/F 79, TLC 56%, DLCO 48%, DLCO/VA 76%  FENO 08/11/2018-28  Allergies  Allergen Reactions  . Codeine Other (See Comments)    "flu like symptoms"  . Morphine Other (See Comments)    "flu like symptoms"  . Peanut-Containing Drug Products Hives  . Sorbitol Other (See Comments)    GI Issues  . Tomato Diarrhea  . Zofran Other (See Comments)    headache  . Advil [Ibuprofen] Other (See Comments)    Irritates throat.  . Diphenhydramine Hcl Other (See Comments)    "nervous and upset"  . Oxycodone Anxiety    Confusion with inability to think clearly    Immunization History  Administered Date(s) Administered  . Fluad Quad(high Dose 65+) 09/17/2019  . Influenza Split 08/27/2011, 10/21/2012  . Influenza, High Dose Seasonal PF 08/01/2016, 09/19/2017  . Influenza, Quadrivalent, Recombinant, Inj, Pf 08/19/2018  . Influenza,inj,Quad PF,6+ Mos 07/31/2013, 12/07/2014, 11/01/2015  . PFIZER SARS-COV-2 Vaccination 01/10/2020, 02/09/2020  . Tdap 04/08/2013    Past Medical History:  Diagnosis Date  . ALKALINE PHOSPHATASE, ELEVATED 03/15/2009  . Allergic state 06/10/2012  . Allergy   . Anemia   . Anxiety and depression 04/28/2011  . Arthritis   . Asthma   . Atypical chest pain 11/30/2011  . 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 Baker's cyst of knee 05/22/2011  . Cancer ([place]) 01,  08   XRT/chemo 01-02/ lobular invasive ca  . Carotid artery disease ([place])    a. Carotid duplex 03/2014: stable 1-39% BICA, f/u due 03/2016.  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    . Clotting disorder ([place])   . COPD (chronic obstructive pulmonary disease) ([place])   . Dermatitis 11/23/2012  . EE (eosinophilic esophagitis)   . Emphysema of lung ([place])   . Esophageal ring   . ESOPHAGEAL STRICTURE 03/29/2009  . Fall 11/23/2012  . Family history of breast cancer    . Family history of colon cancer   . Family history of ovarian cancer   . Family history of pancreatic cancer   . Folliculitis of nose 6/46/8032  . GERD (gastroesophageal reflux disease) 09/29/2009   improved s/p cholecystectomy and esophagus dilatation  . History of chicken pox   . History of measles   . History of shingles    2 episodes  . Hx of echocardiogram    a. Echo 01/2013: mild LVH, EF 55-60%, normal wall motion, Gr 1 diast dysfn  . Hyperlipidemia   . Knee pain, bilateral 07/23/2011  . Medicare annual wellness visit, subsequent 06/19/2015  . Mixed hyperlipidemia 10/17/2010   Qualifier: Diagnosis of  By: Mack Guise    . Orthostasis   . Osteopenia 03/14/2011  . Osteoporosis   . Personal history of chemotherapy 2001  . Personal history of radiation therapy 2001   rt breast  . 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 PVC's (premature ventricular contractions)    a. Event monitor 01/2013: NSR, extensive PVCs.  . Radial neck fracture 10/2011   minimally displaced  . Substance abuse ([place])    In remission 3 years.   . Urinary incontinence 03/19/2012  . Vaginitis 05/22/2011    Tobacco History: Social History   Tobacco Use  Smoking Status Former Smoker  . Packs/day: 1.00  . Years: 50.00  . Pack years: 50.00  . Types: Cigarettes  . Quit date: 05/22/2007  . Years since quitting: 13.0  Smokeless Tobacco Never Used   Counseling given: Not Answered   Outpatient Medications Prior to Visit  Medication Sig Dispense Refill  . acetaminophen (TYLENOL) 500 MG tablet Take 500-1,000 mg by mouth every 6 (six) hours as needed (for pain.).    [name] amitriptyline (ELAVIL) 25 MG tablet TAKE 6 TABLETS(150 MG) BY MOUTH AT BEDTIME 180 tablet 2  . atorvastatin (LIPITOR) 20 MG tablet Take 1 tablet (20 mg total) by mouth 2 (two) times daily. 180 tablet 3  . Biotin (BIOTIN 5000) 5 MG CAPS Take by mouth.    . Cholecalciferol (VITAMIN D3) 1000 UNITS CAPS Take 2,000 Units by mouth daily.     .  Cyanocobalamin (VITAMIN B 12 PO) Take 1 tablet by mouth daily.    [name] diltiazem (CARDIZEM) 30 MG tablet TAKE 1 TABLET BY MOUTH EVERY DAY FOR PALPITATIONS 90 tablet 2  . Ferrous Fumarate-Folic Acid 122-4 MG TABS Take 1 tablet by mouth daily. 30 each 3  . loratadine (CLARITIN) 10 MG tablet Take 1 tablet (10 mg total) by mouth daily. 30 tablet 11  . LORazepam (ATIVAN) 1 MG tablet TAKE 1 TABLET(1 MG) BY MOUTH EVERY 8 HOURS AS NEEDED FOR ANXIETY 70 tablet 1  . pantoprazole (PROTONIX) 40 MG tablet TAKE 1 TABLET(40 MG) BY MOUTH DAILY 90 tablet 1  . permethrin (ELIMITE) 5 % cream Apply from scalp to toes, avoid face. Leave on 8-14 hours. Thoroughly rinse with soap and water. Repeat in 2 weeks if needed. 60 g 1  . rOPINIRole (REQUIP) 0.25 MG tablet Take 1 tablet (0.25 mg total) by mouth at bedtime. 30 tablet 0  . triamcinolone  cream (KENALOG) 0.1 % Apply topically 2 (two) times daily. Apply to affected area 45 g 1   No facility-administered medications prior to visit.   Review of Systems  Review of Systems  Constitutional: Negative.   Respiratory: Positive for cough and shortness of breath. Negative for chest tightness and wheezing.   Cardiovascular: Negative.    Physical Exam  BP (!) 110/64 (BP Location: Left Arm, Cuff Size: Normal)   Pulse 89   Temp 98.6 F (37 C) (Oral)   Ht 5' 5.5" (1.664 m)   Wt 140 lb 6.4 oz (63.7 kg)   SpO2 97%   BMI 23.01 kg/m  Physical Exam Constitutional:      Appearance: Normal appearance.  HENT:     Head: Normocephalic and atraumatic.     Right Ear: Tympanic membrane normal.     Left Ear: Tympanic membrane normal.     Mouth/Throat:     Mouth: Mucous membranes are moist.     Pharynx: Oropharynx is clear.  Cardiovascular:     Rate and Rhythm: Normal rate and regular rhythm.     Heart sounds: Murmur heard.   Pulmonary:     Effort: Pulmonary effort is normal.     Breath sounds: Normal breath sounds. No wheezing.  Skin:    General: Skin is warm and dry.    Neurological:     General: No focal deficit present.     Mental Status: She is alert and oriented to person, place, and time. Mental status is at baseline.  Psychiatric:        Mood and Affect: Mood normal.        Behavior: Behavior normal.        Thought Content: Thought content normal.        Judgment: Judgment normal.      Lab Results:  CBC    Component Value Date/Time   WBC 7.0 05/25/2020 1424   WBC 6.6 04/28/2019 1430   RBC 4.02 05/25/2020 1424   HGB 12.4 05/25/2020 1424   HGB 11.7 09/04/2017 1527   HCT 39.0 05/25/2020 1424   HCT 35.4 09/04/2017 1527   PLT 251 05/25/2020 1424   PLT 231 09/04/2017 1527   MCV 97.0 05/25/2020 1424   MCV 93.6 09/04/2017 1527   MCH 30.8 05/25/2020 1424   MCHC 31.8 05/25/2020 1424   RDW 12.8 05/25/2020 1424   RDW 14.7 (H) 09/04/2017 1527   LYMPHSABS 1.8 05/25/2020 1424   LYMPHSABS 1.8 09/04/2017 1527   MONOABS 0.6 05/25/2020 1424   MONOABS 0.4 09/04/2017 1527   EOSABS 0.2 05/25/2020 1424   EOSABS 0.2 09/04/2017 1527   BASOSABS 0.1 05/25/2020 1424   BASOSABS 0.0 09/04/2017 1527    BMET    Component Value Date/Time   NA 142 05/25/2020 1424   NA 141 09/04/2017 1528   K 3.8 05/25/2020 1424   K 3.8 09/04/2017 1528   CL 104 05/25/2020 1424   CL 102 11/28/2012 1043   CO2 27 05/25/2020 1424   CO2 26 09/04/2017 1528   GLUCOSE 95 05/25/2020 1424   GLUCOSE 97 09/04/2017 1528   GLUCOSE 94 11/28/2012 1043   BUN 16 05/25/2020 1424   BUN 13.2 09/04/2017 1528   CREATININE 0.78 05/25/2020 1424   CREATININE 0.8 09/04/2017 1528   CALCIUM 9.2 05/25/2020 1424   CALCIUM 9.5 09/04/2017 1528   GFRNONAA >60 05/25/2020 1424   GFRAA >60 05/25/2020 1424    BNP No results found for: BNP  ProBNP No results found  for: PROBNP  Imaging: DG Chest 2 View  Result Date: 05/26/2020 CLINICAL DATA:  Emphysema, shortness of breath EXAM: CHEST - 2 VIEW COMPARISON:  04/26/2018 FINDINGS: The heart size and mediastinal contours are within normal limits.  Pulmonary hyperinflation and emphysema. The visualized skeletal structures are unremarkable. IMPRESSION: Pulmonary hyperinflation and emphysema. No acute abnormality of the lungs. Electronically Signed   By: Eddie Candle M.D.   On: 05/26/2020 10:33   MM 3D SCREEN BREAST UNI LEFT  Result Date: 05/24/2020 CLINICAL DATA:  Screening. EXAM: DIGITAL SCREENING UNILATERAL LEFT MAMMOGRAM WITH CAD AND TOMO COMPARISON:  Previous exam(s). ACR Breast Density Category c: The breast tissue is heterogeneously dense, which may obscure small masses. FINDINGS: The patient has had a right mastectomy. There are no findings suspicious for malignancy. Images were processed with CAD. IMPRESSION: No mammographic evidence of malignancy. A result letter of this screening mammogram will be mailed directly to the patient. RECOMMENDATION: Screening mammogram in one year.  (Code:SM-L-27M) BI-RADS CATEGORY  1: Negative. Electronically Signed   By: Claudie Revering M.D.   On: 05/24/2020 08:44     Assessment & Plan:   Emphysema lung ([place]) - Patient has increased shortness of breath over the last several years, more noticeable recently - CXR on 05/26/20 pulmonary hyperinflation and emphysema. No acute abnormality of the Lungs. Minimal obstruction changes on pulmonary function testing - Trial Stiolto Respimat 2 puffs once daily  - Refer to pulmonary rehab    Dyspnea - Recommend repeat echocardiogram d/t dyspnea and murmur heard on exam - Echocardiogram in 2019 showed normal left ventricle ejection fraction and trivial Aortic regurgitation and mild abnormal relaxation  FU in 4 weeks [place], NP 06/06/2020

## 2020-06-06 NOTE — Assessment & Plan Note (Addendum)
-   Patient has increased shortness of breath over the last several years, more noticeable recently - CXR on 05/26/20 pulmonary hyperinflation and emphysema. No acute abnormality of the Lungs. Minimal obstruction changes on pulmonary function testing - Trial Stiolto Respimat 2 puffs once daily  - Refer to pulmonary rehab

## 2020-06-06 NOTE — Patient Instructions (Addendum)
Orders: Echocardiogram re: dyspnea Labs today  Recommendations: Trial Stiolto Respimat -  2 puffs once daily in the morning   Referral: Pulmonary rehab re: COPD/emphysema   Follow-up: 4 weeks with Beth NP televisit    COPD and Physical Activity Chronic obstructive pulmonary disease (COPD) is a long-term (chronic) condition that affects the lungs. COPD is a general term that can be used to describe many different lung problems that cause lung swelling (inflammation) and limit airflow, including chronic bronchitis and emphysema. The main symptom of COPD is shortness of breath, which makes it harder to do even simple tasks. This can also make it harder to exercise and be active. Talk with your health care provider about treatments to help you breathe better and actions you can take to prevent breathing problems during physical activity. What are the benefits of exercising with COPD? Exercising regularly is an important part of a healthy lifestyle. You can still exercise and do physical activities even though you have COPD. Exercise and physical activity improve your shortness of breath by increasing blood flow (circulation). This causes your heart to pump more oxygen through your body. Moderate exercise can improve your:  Oxygen use.  Energy level.  Shortness of breath.  Strength in your breathing muscles.  Heart health.  Sleep.  Self-esteem and feelings of self-worth.  Depression, stress, and anxiety levels. Exercise can benefit everyone with COPD. The severity of your disease may affect how hard you can exercise, especially at first, but everyone can benefit. Talk with your health care provider about how much exercise is safe for you, and which activities and exercises are safe for you. What actions can I take to prevent breathing problems during physical activity?  Sign up for a pulmonary rehabilitation program. This type of program may include: ? Education about lung  diseases. ? Exercise classes that teach you how to exercise and be more active while improving your breathing. This usually involves:  Exercise using your lower extremities, such as a stationary bicycle.  About 30 minutes of exercise, 2 to 5 times per week, for 6 to 12 weeks  Strength training, such as push ups or leg lifts. ? Nutrition education. ? Group classes in which you can talk with others who also have COPD and learn ways to manage stress.  If you use an oxygen tank, you should use it while you exercise. Work with your health care provider to adjust your oxygen for your physical activity. Your resting flow rate is different from your flow rate during physical activity.  While you are exercising: ? Take slow breaths. ? Pace yourself and do not try to go too fast. ? Purse your lips while breathing out. Pursing your lips is similar to a kissing or whistling position. ? If doing exercise that uses a quick burst of effort, such as weight lifting:  Breathe in before starting the exercise.  Breathe out during the hardest part of the exercise (such as raising the weights). Where to find support You can find support for exercising with COPD from:  Your health care provider.  A pulmonary rehabilitation program.  Your local health department or community health programs.  Support groups, online or in-person. Your health care provider may be able to recommend support groups. Where to find more information You can find more information about exercising with COPD from:  American Lung Association: ClassInsider.se.  COPD Foundation: https://www.rivera.net/. Contact a health care provider if:  Your symptoms get worse.  You have chest pain.  You have nausea.  You have a fever.  You have trouble talking or catching your breath.  You want to start a new exercise program or a new activity. Summary  COPD is a general term that can be used to describe many different lung problems that cause  lung swelling (inflammation) and limit airflow. This includes chronic bronchitis and emphysema.  Exercise and physical activity improve your shortness of breath by increasing blood flow (circulation). This causes your heart to provide more oxygen to your body.  Contact your health care provider before starting any exercise program or new activity. Ask your health care provider what exercises and activities are safe for you. This information is not intended to replace advice given to you by your health care provider. Make sure you discuss any questions you have with your health care provider. Document Revised: 02/18/2019 Document Reviewed: 11/21/2017 Elsevier Patient Education  2020 [employer].

## 2020-06-08 ENCOUNTER — Encounter: Payer: Self-pay | Admitting: Hematology

## 2020-06-09 ENCOUNTER — Telehealth: Payer: Self-pay | Admitting: Primary Care

## 2020-06-09 ENCOUNTER — Telehealth (HOSPITAL_COMMUNITY): Payer: Self-pay

## 2020-06-09 DIAGNOSIS — J439 Emphysema, unspecified: Secondary | ICD-10-CM

## 2020-06-09 NOTE — Telephone Encounter (Signed)
Recv'ed pulmonary referral, referral was signed by a NP. Referral has to be signed by a MD, called and spoke with Tonya at [facility] and she stated she will send a message back to the Np. Will await for new referral.

## 2020-06-09 NOTE — Telephone Encounter (Signed)
Called and spoke with Pulmonary Rehab and let them know that new order has been placed and sent to Dr. Vaughan Browner to sign. They expressed understanding. Nothing further needed at this time.    Dr. Vaughan Browner just FYI to please sign order

## 2020-06-09 NOTE — Telephone Encounter (Signed)
Called pt   Hx of pain on front of shin L leg then R leg   Burning   Transient   Occurred while standing   Gone No pain in posterior leg with activity Walks in park without problems  I do not thiink symptoms represent PAD  Would review with neuorologist with hx neuropathy

## 2020-06-15 LAB — ALPHA-1 ANTITRYPSIN PHENOTYPE: A-1 Antitrypsin, Ser: 155 mg/dL (ref 83–199)

## 2020-06-16 ENCOUNTER — Other Ambulatory Visit: Payer: Self-pay

## 2020-06-16 ENCOUNTER — Inpatient Hospital Stay: Payer: PPO | Attending: Hematology | Admitting: Nurse Practitioner

## 2020-06-16 ENCOUNTER — Encounter: Payer: Self-pay | Admitting: Nurse Practitioner

## 2020-06-16 ENCOUNTER — Telehealth: Payer: Self-pay

## 2020-06-16 VITALS — BP 127/83 | HR 100 | Temp 98.1°F | Resp 18 | Ht 65.5 in | Wt 141.7 lb

## 2020-06-16 DIAGNOSIS — M8580 Other specified disorders of bone density and structure, unspecified site: Secondary | ICD-10-CM | POA: Insufficient documentation

## 2020-06-16 DIAGNOSIS — Z79811 Long term (current) use of aromatase inhibitors: Secondary | ICD-10-CM | POA: Diagnosis not present

## 2020-06-16 DIAGNOSIS — C50911 Malignant neoplasm of unspecified site of right female breast: Secondary | ICD-10-CM | POA: Diagnosis not present

## 2020-06-16 DIAGNOSIS — Z79899 Other long term (current) drug therapy: Secondary | ICD-10-CM | POA: Insufficient documentation

## 2020-06-16 DIAGNOSIS — Z853 Personal history of malignant neoplasm of breast: Secondary | ICD-10-CM | POA: Diagnosis not present

## 2020-06-16 DIAGNOSIS — R590 Localized enlarged lymph nodes: Secondary | ICD-10-CM | POA: Insufficient documentation

## 2020-06-16 DIAGNOSIS — Z9011 Acquired absence of right breast and nipple: Secondary | ICD-10-CM | POA: Diagnosis not present

## 2020-06-16 DIAGNOSIS — J449 Chronic obstructive pulmonary disease, unspecified: Secondary | ICD-10-CM | POA: Diagnosis not present

## 2020-06-16 DIAGNOSIS — Z170 Estrogen receptor positive status [ER+]: Secondary | ICD-10-CM | POA: Diagnosis not present

## 2020-06-16 DIAGNOSIS — C792 Secondary malignant neoplasm of skin: Secondary | ICD-10-CM | POA: Diagnosis not present

## 2020-06-16 MED ORDER — LETROZOLE 2.5 MG PO TABS
2.5000 mg | ORAL_TABLET | Freq: Every day | ORAL | 3 refills | Status: DC
Start: 2020-06-16 — End: 2022-02-01

## 2020-06-16 NOTE — Telephone Encounter (Signed)
Error

## 2020-06-16 NOTE — Progress Notes (Addendum)
[place]   Telephone:([phone] Fax:([phone]) [phone]   Clinic Follow up Note   Patient Care Team: [name], MD as PCP - General (Family Medicine) Fay Records, MD as PCP - Cardiology (Cardiology) Paralee Cancel, MD as Consulting Physician (Orthopedic Surgery) Gatha Mayer, MD as Consulting Physician (Gastroenterology) Clent Jacks, MD as Consulting Physician (Ophthalmology) Roney Jaffe, DDS (Oral Surgery) Everlene Other (Dentistry) Haverstock, Jennefer Bravo, MD as Referring Physician (Dermatology) Dillingham, Loel Lofty, DO as Attending Physician (Plastic Surgery) Truitt Merle, MD as Consulting Physician (Hematology) 06/16/2020  CHIEF COMPLAINT: Follow-up history of breast cancer, skin biopsy  SUMMARY OF ONCOLOGIC HISTORY: Oncology History  Breast cancer, right breast ([place])  01/2000 Initial Diagnosis   Breast cancer, right breast ([place])  History of primary lobular right breast carcinoma. 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 I believe that she had local radiation, tho that information is not available in this EMR.   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 2008 (tho patient did not agree to surgery until July 2008), invasive lobular carcinoma ER 81%, PR 96%, Her 2 1+ (PM08-99) , 1.5 cm invasive lobular with 3 axillary nodes negative, LVSI and perineural invasion present ([id number]). She subsequently declined adjuvant systemic treatment . She had a complicated course following right mastectomy, with difficult healing after right breast reconstruction.    07/2009  Relapse/Recurrence   She had skin recurrence lateral right chest wall excised in Sept 2010 then treated with xeloda from Oct 2010 thru Feb 2011 and began on Arimidex July 2011; she may have stopped Arimidex after bone density scan 02-2014, or possibly the year prior (?).    2015 Imaging   Bone density scan [facility] 02-15-14 still osteopenic range, slightly lower in LS compared with 2013 and stable in hips.     CURRENT THERAPY: Previously on observation/surveillance  INTERVAL HISTORY: Ms. Stanwood returns for follow-up skin biopsy results. She doesn't feel well today, feels like "I have the flu without the symptoms." She is generally fatigued but remains out of bed and active at home. Denies fever, chills, cough, sore throat, chest pain. Her exertional dyspnea is stable. She has occasional pain at the top of her sternum if she moves her arm a certain way, denies other bone pain. Denies unintentional weight loss, change in bowel habits, abdominal pain.   MEDICAL HISTORY:  Past Medical History:  Diagnosis Date  . ALKALINE PHOSPHATASE, ELEVATED 03/15/2009  . Allergic state 06/10/2012  . Allergy   . Anemia   . Anxiety and depression 04/28/2011  . Arthritis   . Asthma   . Atypical chest pain 11/30/2011  . 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 Baker's cyst of knee 05/22/2011  . Cancer ([place]) 01,  08   XRT/chemo 01-02/ lobular invasive ca  . Carotid artery disease ([place])    a. Carotid duplex 03/2014: stable 1-39% BICA, f/u due 03/2016.  [name] Chronic alcoholism in remission ([place]) 03/29/2011   Did not tolerate Klonopin, caused some confusion and bad dreams.    . Clotting disorder ([place])   . COPD (chronic obstructive pulmonary disease) ([place])   .  Dermatitis 11/23/2012  . EE (eosinophilic esophagitis)   . Emphysema of lung ([place])   . Esophageal ring   . ESOPHAGEAL STRICTURE 03/29/2009  . Fall 11/23/2012  . Family history of breast cancer   . Family history of colon cancer   . Family history of  ovarian cancer   . Family history of pancreatic cancer   . Folliculitis of nose 4/78/2956  . GERD (gastroesophageal reflux disease) 09/29/2009   improved s/p cholecystectomy and esophagus dilatation  . History of chicken pox   . History of measles   . History of shingles    2 episodes  . Hx of echocardiogram    a. Echo 01/2013: mild LVH, EF 55-60%, normal wall motion, Gr 1 diast dysfn  . Hyperlipidemia   . Knee pain, bilateral 07/23/2011  . Medicare annual wellness visit, subsequent 06/19/2015  . Mixed hyperlipidemia 10/17/2010   Qualifier: Diagnosis of  By: Mack Guise    . Orthostasis   . Osteopenia 03/14/2011  . Osteoporosis   . Personal history of chemotherapy 2001  . Personal history of radiation therapy 2001   rt breast  . 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 PVC's (premature ventricular contractions)    a. Event monitor 01/2013: NSR, extensive PVCs.  . Radial neck fracture 10/2011   minimally displaced  . Substance abuse ([place])    In remission 3 years.   . Urinary incontinence 03/19/2012  . Vaginitis 05/22/2011    SURGICAL HISTORY: Past Surgical History:  Procedure Laterality Date  . APPENDECTOMY    . AUGMENTATION MAMMAPLASTY Right 05/27/2007  . BREAST BIOPSY Right 01/02/2007   wire loc  . BREAST BIOPSY  12/26/2006  . BREAST LUMPECTOMY Right 2001  . BREAST RECONSTRUCTION  2008, 2009, 2010  . BREAST REDUCTION WITH MASTOPEXY Left 05/30/2017   Procedure: LEFT BREAST REDUCTION FOR SYMTERY WITH MASTOPEXY;  Surgeon: Wallace Going, DO;  Location: [facility];  Service: Plastics;  Laterality: Left;  . CHOLECYSTECTOMY  2010  . COLONOSCOPY  09/05/10   cecal avm's  . DENTAL SURGERY  05/2016   4 dental implants by Dr. Loyal Gambler.  [name] ERCP  2010    CBD stone extraction   . ESOPHAGOGASTRODUODENOSCOPY  01/08/2012   Procedure: ESOPHAGOGASTRODUODENOSCOPY (EGD);  Surgeon: Gatha Mayer, MD;  Location[facility];  Service: Endoscopy;  Laterality: N/A;  .  ESOPHAGOGASTRODUODENOSCOPY (EGD) WITH ESOPHAGEAL DILATION  2010, 2012  . LAPAROSCOPIC APPENDECTOMY N/A 08/04/2016   Procedure: APPENDECTOMY LAPAROSCOPIC;  Surgeon: Michael Boston, MD;  Location: [facility];  Service: General;  Laterality: N/A;  . LIPOSUCTION WITH LIPOFILLING Left 11/21/2017   Procedure: LIPOSUCTION FROM ABDOMEN WITH LIPOFILLING TO LEFT BREAST;  Surgeon: Wallace Going, DO;  Location: [facility];  Service: Plastics;  Laterality: Left;  [name] MASTECTOMY MODIFIED RADICAL Right 05/27/2007   , Mastectomy modified radical (08), breast reconstruction, CA lesions excised lateral abd wall 2010  . MASTOPEXY Left 11/21/2017   Procedure: LEFT BREAST REVISION MASTOPEXY FOR SYMMETRY;  Surgeon: Wallace Going, DO;  Location: [facility];  Service: Plastics;  Laterality: Left;  . REDUCTION MAMMAPLASTY Left   . SAVORY DILATION  01/08/2012   Procedure: SAVORY DILATION;  Surgeon: Gatha Mayer, MD;  Location: [facility];  Service: Endoscopy;  Laterality: N/A;  need xray    I have reviewed the social history and family history with the patient and they are unchanged from previous note.  ALLERGIES:  is allergic to codeine, morphine, peanut-containing drug products,  sorbitol, tomato, zofran, advil [ibuprofen], diphenhydramine hcl, and oxycodone.  MEDICATIONS:  Current Outpatient Medications  Medication Sig Dispense Refill  . acetaminophen (TYLENOL) 500 MG tablet Take 500-1,000 mg by mouth every 6 (six) hours as needed (for pain.).    [name] amitriptyline (ELAVIL) 25 MG tablet TAKE 6 TABLETS(150 MG) BY MOUTH AT BEDTIME 180 tablet 2  . atorvastatin (LIPITOR) 20 MG tablet Take 1 tablet (20 mg total) by mouth 2 (two) times daily. 180 tablet 3  . Biotin (BIOTIN 5000) 5 MG CAPS Take by mouth.    . Cholecalciferol (VITAMIN D3) 1000 UNITS CAPS Take 2,000 Units by mouth daily.     . Cyanocobalamin (VITAMIN B 12 PO) Take 1 tablet by mouth daily.    [name] diltiazem (CARDIZEM) 30 MG  tablet TAKE 1 TABLET BY MOUTH EVERY DAY FOR PALPITATIONS 90 tablet 2  . Ferrous Fumarate-Folic Acid 583-0 MG TABS Take 1 tablet by mouth daily. 30 each 3  . letrozole (FEMARA) 2.5 MG tablet Take 1 tablet (2.5 mg total) by mouth daily. 90 tablet 3  . loratadine (CLARITIN) 10 MG tablet Take 1 tablet (10 mg total) by mouth daily. 30 tablet 11  . LORazepam (ATIVAN) 1 MG tablet TAKE 1 TABLET(1 MG) BY MOUTH EVERY 8 HOURS AS NEEDED FOR ANXIETY 70 tablet 1  . pantoprazole (PROTONIX) 40 MG tablet TAKE 1 TABLET(40 MG) BY MOUTH DAILY 90 tablet 1  . permethrin (ELIMITE) 5 % cream Apply from scalp to toes, avoid face. Leave on 8-14 hours. Thoroughly rinse with soap and water. Repeat in 2 weeks if needed. 60 g 1  . rOPINIRole (REQUIP) 0.25 MG tablet Take 1 tablet (0.25 mg total) by mouth at bedtime. 30 tablet 0  . Tiotropium Bromide-Olodaterol (STIOLTO RESPIMAT) 2.5-2.5 MCG/ACT AERS Inhale 2 puffs into the lungs daily. 4 g 0  . triamcinolone cream (KENALOG) 0.1 % Apply topically 2 (two) times daily. Apply to affected area 45 g 1   No current facility-administered medications for this visit.    PHYSICAL EXAMINATION: ECOG PERFORMANCE STATUS: 1 - Symptomatic but completely ambulatory  Vitals:   06/16/20 1528  BP: 127/83  Pulse: 100  Resp: 18  Temp: 98.1 F (36.7 C)  SpO2: 94%   Filed Weights   06/16/20 1528  Weight: 141 lb 11.2 oz (64.3 kg)    GENERAL:alert, no distress and comfortable SKIN: skin color, texture, turgor are normal, no rashes or significant lesions EYES:  sclera clear  NECK: Without mass LYMPH:  no palpable cervical, supraclavicular, or axillary lymphadenopathy LUNGS: clear with normal breathing effort HEART: regular rate & rhythm, no lower extremity edema ABDOMEN: abdomen soft, non-tender and normal bowel sounds Musculoskeletal: No focal tenderness at the sternum, right clavicle, shoulder, neck or spine NEURO: alert & oriented x 3 with fluent speech, no focal motor/sensory  deficits Breast exam: Areas of skin thickening with erythema scattered over the right breast implant and chest wall, slightly increased from prior; see image below      LABORATORY DATA:  I have reviewed the data as listed CBC Latest Ref Rng & Units 05/25/2020 03/14/2020 12/11/2019  WBC 4.0 - 10.5 K/uL 7.0 6.5 5.5  Hemoglobin 12.0 - 15.0 g/dL 12.4 12.2 12.1  Hematocrit 36 - 46 % 39.0 38.2 37.4  Platelets 150 - 400 K/uL 251 264 281     CMP Latest Ref Rng & Units 05/25/2020 12/11/2019 08/06/2019  Glucose 70 - 99 mg/dL 95 98 88  BUN 8 - 23 mg/dL 16 16 19  Creatinine 0.44 - 1.00 mg/dL 0.78 0.74 0.63  Sodium 135 - 145 mmol/L 142 143 140  Potassium 3.5 - 5.1 mmol/L 3.8 3.8 4.5  Chloride 98 - 111 mmol/L 104 105 100  CO2 22 - 32 mmol/L 27 30 31   Calcium 8.9 - 10.3 mg/dL 9.2 9.3 9.9  Total Protein 6.5 - 8.1 g/dL 7.3 7.0 7.0  Total Bilirubin 0.3 - 1.2 mg/dL 0.2(L) 0.4 0.3  Alkaline Phos 38 - 126 U/L 106 94 96  AST 15 - 41 U/L 21 18 19   ALT 0 - 44 U/L 19 21 15       RADIOGRAPHIC STUDIES: I have personally reviewed the radiological images as listed and agreed with the findings in the report. No results found.   ASSESSMENT & PLAN: 79 year old female with   1. History of recurrent right breast lobular carcinoma ER/PR+ diagnosed in 01/2000; skin recurrence in 2008 ER+/PR+/HER2- and chest wall recurrence ER/PR+ in 2010 -initially treated with lumpectomy, CMF and 5 years tamoxifen, unsure if she received RT -S/p right mastectomy with reconstruction/implant; treated with Xeloda and arimidex in 2011-2015 for chest wall recurrence -Currently on surveillance -She developed skin eruption in 7/21 not responsive to topical treatments, punch biopsy by dermatology revealed infiltrating breast carcinoma, histologic and IHC findings support a diagnosis of breast carcinoma, ER 100% positive with moderate-weak staining intensity, progesterone 0% negative, HER-2 equivocal by IHC and negative by FISH.   2.  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  Followed by Dr. Vaughan Browner pulmonology  3. Osteopenia -DEXA in 06/2018 showed L1-L4 with T score -2.3, she takes 2 Tums before bed -She has various bone pains over time, in late 2020 she developed sternoclavicular pain, 11/24/2019 bone scan showed no definite evidence of osseous metastatic disease and probable degenerative changes -We will repeat DEXA to establish new baseline as she restarts AI -She was encouraged to add vitamin D and increase weightbearing exercises   Disposition: Ms. Talamante appears stable.  We reviewed her skin punch biopsy of the anterior chest wall which shows infiltrating carcinoma, the tumor cells are strongly positive for cytokeratin 7 and GATA-3 staining and are negative for cytokeratin 20, the combined histologic and IHC findings support a diagnosis of breast carcinoma, ER 100% positive with moderate-weak staining intensity, progesterone 0% negative, HER-2 equivocal by IHC and negative by FISH.  We reviewed her breast MRI from 10/30/2019 and recent unilateral left mammogram on 05/19/2020 shows no primary tumor, this is likely recurrence from her previous breast cancer. We are referring her for staging PET scan.   The patient was seen with Dr. Burr Medico who discussed that given the nature of skin recurrence, and the large area that this covers, it would be difficult to resect.  The patient is interested in discussing with plastic surgery nonetheless. Due to ER+, Dr. Burr Medico is recommending treatment with antiestrogen therapy, letrozole in addition to CDK inhibitor, palbociclib.  Side effects including but not limited to fatigue, decreased appetite, GI toxicities, hematologic toxicities including anemia, thrombocytopenia, and increased infection risk were discussed.  Side effects of AI including bone/joint pain,  metabolic changes, vasomotor symptoms, and osteopenia/osteoporosis were discussed. We are recommending to repeat DEXA this month. The patient agrees to proceed.    We will call her after PET scan, If she has disease limited to the skin over the right breast/chest wall and she is found to be a surgical candidate the goal  of therapy may potentially be curative, otherwise the treatment goal is palliative.   She will begin letrozole when she picks it up, she will call us when she receives [place] and we will schedule her for a lab and f/u 2 weeks after her start date.    Orders Placed This Encounter  Procedures  . NM PET Image Initial (PI) Skull Base To Thigh    Standing Status:   Future    Standing Expiration Date:   06/16/2021    Order Specific Question:   If indicated for the ordered procedure, I authorize the administration of a radiopharmaceutical per Radiology protocol    Answer:   Yes    Order Specific Question:   Preferred imaging location?    Answer:   [facility]    Order Specific Question:   Radiology Contrast Protocol - do NOT remove file path    Answer:   [email or web address]  . DG Bone Density    Standing Status:   Future    Standing Expiration Date:   06/16/2021    Scheduling Instructions:     [facility]    Order Specific Question:   Reason for Exam (SYMPTOM  OR DIAGNOSIS REQUIRED)    Answer:   osteopenia, restarting aromatase inhibitor    Order Specific Question:   Preferred imaging location?    Answer:   [facility]  . Vitamin D 25 hydroxy    Standing Status:   Standing    Number of Occurrences:   1    Standing Expiration Date:   06/16/2021  . Ambulatory referral to Plastic Surgery    Referral Priority:   Routine    Referral Type:   Surgical    Referral Reason:   Specialty Services Required    Referred to Provider:   Wallace Going, DO    Requested Specialty:   Plastic Surgery    Number of Visits Requested:   1   All questions were answered. The  patient knows to call the clinic with any problems, questions or concerns. No barriers to learning were detected.     Alla Feeling, NP 06/16/20   Addendum  I have seen the patient, examined her. I agree with the assessment and and plan and have edited the notes.   I reviewed her recent right upper chest skin biopsy results, unfortunately it showed metastatic breast cancer, ER positive, PR negative, HER-2 negative.  I will obtain a staging PET scan to evaluate the extent of her disease, and ruled out other distant metastasis.  Unfortunately due to the diffuse skin involvement, I do not think she is a candidate for surgical resection, but will refer her to plastic surgeon Dr. Niel Hummer him for further evaluation.  I discussed the overall prognosis of metastatic breast cancer.  This is likely recurrence from her previous breast cancer, and this is the third recurrence, and last recurrence was 11 years ago. I recommend aromatase inhibitor letrozole and CDK4/6 inhibitor Ibrance as first line therapy.  Potential benefit and side effects discussed with her, she agrees to proceed.  We will call in letrozole today, Leslee Home will be reviewed by specialty pharmacy, and it may take up to a few weeks to get it reviewed.  She will start as soon as she receives it. Will monitor her CBC closely.  I spent a total of 30 mins for her visit today.    Truitt Merle  06/16/2020

## 2020-06-17 ENCOUNTER — Telehealth: Payer: Self-pay

## 2020-06-17 ENCOUNTER — Encounter (HOSPITAL_COMMUNITY): Payer: Self-pay | Admitting: *Deleted

## 2020-06-17 ENCOUNTER — Other Ambulatory Visit: Payer: Self-pay | Admitting: Family Medicine

## 2020-06-17 ENCOUNTER — Other Ambulatory Visit: Payer: Self-pay

## 2020-06-17 NOTE — Telephone Encounter (Signed)
-----   Message from Aundria Rud sent at 06/17/2020  7:06 AM EDT ----- Kermit Balo Morning, Referral Authorized ----- Message ----- From: Alla Feeling, NP Sent: 06/16/2020   5:05 PM EDT To: Raynelle Bring, LPN, Thelma Comp, please help to get her PET scan approved asap, patient is anxious to get it done first available.   Thanks, Regan Rakers

## 2020-06-17 NOTE — Telephone Encounter (Signed)
Called confirmed PET scan appt pt aware no questions

## 2020-06-17 NOTE — Telephone Encounter (Signed)
Called messaged left concerning upcoming PET scan appt for 06/23/20 at 11am arrive at 1030am to 1rst floor radiology at [facility] npo x 6hrs prior

## 2020-06-17 NOTE — Progress Notes (Signed)
Received referral from Dr. Vaughan Browner for this pt to participate in pulmonary rehab with the the diagnosis of Pulmonary Emphysema. Clinical review of pt follow up appt on 7/26 with Geraldo Pitter NP  Pulmonary office note.  Pt with Covid Risk Score - 4. Pt appropriate for scheduling for Pulmonary rehab.  Will forward to Pulmonary rehab staff for scheduling. Cherre Huger, BSN Cardiac and Training and development officer

## 2020-06-19 MED ORDER — PALBOCICLIB 125 MG PO TABS: 125.0000 mg | ORAL_TABLET | Freq: Every day | ORAL | 0 refills | Status: DC

## 2020-06-20 ENCOUNTER — Encounter: Payer: Self-pay | Admitting: Nurse Practitioner

## 2020-06-20 ENCOUNTER — Encounter: Payer: Self-pay | Admitting: Hematology

## 2020-06-21 ENCOUNTER — Telehealth: Payer: Self-pay | Admitting: Pharmacy Technician

## 2020-06-21 ENCOUNTER — Encounter: Payer: Self-pay | Admitting: Nurse Practitioner

## 2020-06-21 ENCOUNTER — Telehealth: Payer: Self-pay | Admitting: Pharmacist

## 2020-06-21 NOTE — Telephone Encounter (Signed)
Oral Oncology Patient Advocate Encounter  Received notification from [facility]) that prior authorization for Leslee Home is required.  PA submitted on CoverMyMeds Key [id number]  Status is pending  Oral Oncology Clinic will continue to follow.  [place] Patient [place] Phone [phone] Fax [phone] 06/21/2020 9:01 AM

## 2020-06-21 NOTE — Telephone Encounter (Signed)
Oral Oncology Pharmacist Encounter  Received new prescription for Ibrance (palbociclib) for the treatment of metastatic ER/PR positive, HER2-negative breast cancer, in conjunction with letrozole, planned duration until disease progression or unacceptable drug toxicity.  Prescription dose and frequency assessed for appropriateness. OK for therapy initiation.   CMP and CBC w/ Diff from 05/25/20 assessed, no relevant lab abnormalities noted for Ibrance initiation.  Current medication list in Epic reviewed, DDIs with Ibrance identified:  Category D drug-drug interaction between Ibrance and diltiazem - diltiazem, can increase serum concentrations of Ibrance through CYP3A4 inhibition, which is a major pathway of metabolism for Ibrance. This may increase risk for Ibrance side effects. Will counsel patient to monitor for increased side effects including, but not limited to, cytopenias, fatigue, N/V - patient will also have close follow-up in clinic with MD and NP.  Evaluated chart and no patient barriers to medication adherence noted.   Prescription has been e-scribed to the [facility] for benefits analysis and approval.  Oral Oncology Clinic will continue to follow for insurance authorization, copayment issues, initial counseling and start date.  Leron Croak, PharmD, BCPS Hematology/Oncology Clinical Pharmacist [facility] [phone] 06/21/2020 8:47 AM

## 2020-06-21 NOTE — Telephone Encounter (Signed)
Oral Oncology Pharmacist Encounter   Spoke with patient regarding co-pay for Ibrance.  Co-pay of [phone] is unaffordable for patient. Will proceed with applying for patient assistance through [employer] together.  Patient aware she will need to come by the office for signature on application and to bring financial documentation required for patient assistance enrollment form.   Oral Oncology Clinic will continue to follow.   Leron Croak, PharmD, BCPS Hematology/Oncology Clinical Pharmacist [facility] [phone] 06/21/2020 11:12 AM

## 2020-06-21 NOTE — Telephone Encounter (Signed)
Oral Oncology Patient Advocate Encounter  Prior Authorization for Anne [name] has been approved.    PA# [id number] Effective dates: 06/21/20 through 06/21/21  Patients co-pay is $2645.74.   Oral Oncology Clinic will continue to follow.   [place] Patient [place] Phone [phone] Fax [phone] 06/21/2020 9:45 AM

## 2020-06-22 ENCOUNTER — Other Ambulatory Visit: Payer: Self-pay

## 2020-06-22 ENCOUNTER — Ambulatory Visit (HOSPITAL_BASED_OUTPATIENT_CLINIC_OR_DEPARTMENT_OTHER): Payer: PPO

## 2020-06-22 DIAGNOSIS — R0602 Shortness of breath: Secondary | ICD-10-CM | POA: Diagnosis not present

## 2020-06-22 DIAGNOSIS — C792 Secondary malignant neoplasm of skin: Secondary | ICD-10-CM | POA: Diagnosis not present

## 2020-06-22 DIAGNOSIS — C50911 Malignant neoplasm of unspecified site of right female breast: Secondary | ICD-10-CM | POA: Diagnosis not present

## 2020-06-22 LAB — ECHOCARDIOGRAM COMPLETE
Area-P 1/2: 3.85 cm2
S' Lateral: 2.3 cm

## 2020-06-22 NOTE — Telephone Encounter (Signed)
Oral Oncology Pharmacist Encounter   Met with patient in the [facility] lobby to obtain signatures and financial information for [employer] together patient assistance enrollment form for [place].  Application faxed 08/04/29 to [place] together. Fax number: [phone]   Oral Oncology Clinic will continue to follow.   Leron Croak, PharmD, BCPS Hematology/Oncology Clinical Pharmacist [facility] [phone] 06/22/2020 7:57 AM

## 2020-06-23 ENCOUNTER — Encounter: Payer: Self-pay | Admitting: Hematology

## 2020-06-23 ENCOUNTER — Ambulatory Visit (HOSPITAL_COMMUNITY)
Admission: RE | Admit: 2020-06-23 | Discharge: 2020-06-23 | Disposition: A | Discharge status: Home / Self Care | Payer: PPO | Source: Ambulatory Visit | Attending: Nurse Practitioner | Admitting: Nurse Practitioner

## 2020-06-23 ENCOUNTER — Other Ambulatory Visit: Payer: Self-pay | Admitting: Hematology

## 2020-06-23 ENCOUNTER — Ambulatory Visit (HOSPITAL_COMMUNITY): Admission: RE | Admit: 2020-06-23 | Payer: PPO | Source: Ambulatory Visit

## 2020-06-23 DIAGNOSIS — C50911 Malignant neoplasm of unspecified site of right female breast: Secondary | ICD-10-CM | POA: Insufficient documentation

## 2020-06-23 DIAGNOSIS — C792 Secondary malignant neoplasm of skin: Secondary | ICD-10-CM | POA: Insufficient documentation

## 2020-06-23 DIAGNOSIS — R0602 Shortness of breath: Secondary | ICD-10-CM | POA: Insufficient documentation

## 2020-06-23 LAB — GLUCOSE, CAPILLARY: Glucose-Capillary: 97 mg/dL (ref 70–99)

## 2020-06-23 MED ORDER — FLUDEOXYGLUCOSE F - 18 (FDG) INJECTION
7.5000 | Freq: Once | INTRAVENOUS | Status: AC | PRN
  Administered 2020-06-23: 7.5 via INTRAVENOUS

## 2020-06-23 NOTE — Telephone Encounter (Signed)
There is a potential interaction with amitriptyline listed. Typically we can monitor symptoms and discontinue if there is an increase in blood pressure or HR. Let me discuss with our pharmacist. You may be able to still safely use.

## 2020-06-23 NOTE — Telephone Encounter (Signed)
Please see MyChart message from patient and advise.  [name]:  After reading the material regarding the "Stiolto Respimat," I have not used this Inhalation spray because this drug has an interaction with Amitripytline which I take nightly; it is among the top medications on my drug list which should be in my file.  The boxes have not been opened, and I will return them to you.  This is very disheartening; I thought there was finally something to improve my condition.  Thank you for your assistance.  Dayton Bailiff

## 2020-06-23 NOTE — Telephone Encounter (Signed)
Beth you are correct.  The risk is increase in anti-cholinergic effects (urinary retention, constipation, tachycardia, dry mouth).  Since Stiolto is being inhaled would have less risk of systemic effects.  Patient should be able to use Stiolto safely and alert [place] if she has any of the symptoms above.

## 2020-06-23 NOTE — Telephone Encounter (Signed)
Thank you very much! Triage please relay message from our pharmacist to patient

## 2020-06-24 ENCOUNTER — Encounter: Payer: Self-pay | Admitting: Hematology

## 2020-06-25 ENCOUNTER — Telehealth: Payer: Self-pay | Admitting: Hematology

## 2020-06-25 NOTE — Telephone Encounter (Signed)
I called pt to discussed PET scan results. She has read the report before my call. PET scan showed no definitive hypermetabolic metastatic disease, so she believes she does not have cancer. I remainded her that her recent right front chest wall skin biopsy confirmed recurrent breast cancer. She became confused and very upset. I spoke with her husband Sam afterwards about the biopsy and PET result, he was able to understand better. He states both of them are elderly and may have difficulty to completely understand medical issues. He would like to schedule a in-person visit with me to review this again, and their daughter will come in with them. Since I will present her case in tumor board on Wednesday 8/18 I will schedule her visit with me at 4pm on 8/18 then, he agrees.   Truitt Merle  06/25/2020

## 2020-06-28 MED FILL — IBRANCE 125 MG TABS: 125 | 21 days supply | Qty: 21 | Fill #0

## 2020-06-28 NOTE — Telephone Encounter (Signed)
Oral Chemotherapy Pharmacist Encounter  Patient will pick up Ibrance from [facility] on 06/29/20.  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   Counseled patient on administration, dosing, side effects, monitoring, drug-food interactions, safe handling, storage, and disposal.  Patient will take Ibrance 176m tablets, 1 tablet by mouth once daily, with or without food, taken for 3 weeks on, 1 week off, and repeated.  Patient knows to avoid grapefruit and grapefruit juice while on treatment with Ibrance.  Patient is taking Letrozole 2.5 mg by mouth once daily with Ibrance.  Ibrance start date: 06/29/20  Adverse effects include but are not limited to: fatigue, hair loss, GI upset, nausea, decreased blood counts, and increased upper respiratory infections. Severe, life-threatening, and/or fatal interstitial lung disease (ILD) and/or pneumonitis may occur with CDK 4/6 inhibitors.  Patient will obtain anti diarrheal and alert the office of 4 or more loose stools above baseline.  Patient reminded of WBC check on Cycle 1 Day 14 for dose and ANC assessment.  Reviewed with patient importance of keeping a medication schedule and plan for any missed doses. No barriers to medication adherence identified.  Medication reconciliation performed and medication/allergy list updated. Discussed drug-drug interaction between diltiazem and Ibrance, patient knows to monitor for increase side effects from ILanagan  Medication education handout as well as patient calendar provided for patient.   Medication voucher for first cycle of Ibrance utilized while  manufacturer assistance application is pending.  All questions answered.  Mrs. And Mr. CPostellvoiced understanding and appreciation.   Patient knows  to call the office with questions or concerns.  RLeron Croak PharmD, BCPS Hematology/Oncology Clinical Pharmacist [phone]/17/2021 9:55 AM

## 2020-06-29 ENCOUNTER — Other Ambulatory Visit: Payer: Self-pay

## 2020-06-29 ENCOUNTER — Inpatient Hospital Stay: Payer: PPO | Admitting: Hematology

## 2020-06-29 ENCOUNTER — Telehealth: Payer: Self-pay

## 2020-06-29 ENCOUNTER — Encounter: Payer: Self-pay | Admitting: Hematology

## 2020-06-29 VITALS — BP 128/71 | HR 93 | Temp 96.0°F | Resp 18 | Ht 65.5 in | Wt 142.4 lb

## 2020-06-29 DIAGNOSIS — Z853 Personal history of malignant neoplasm of breast: Secondary | ICD-10-CM | POA: Diagnosis not present

## 2020-06-29 DIAGNOSIS — C792 Secondary malignant neoplasm of skin: Secondary | ICD-10-CM

## 2020-06-29 DIAGNOSIS — Z8262 Family history of osteoporosis: Secondary | ICD-10-CM | POA: Diagnosis not present

## 2020-06-29 DIAGNOSIS — Z13820 Encounter for screening for osteoporosis: Secondary | ICD-10-CM | POA: Diagnosis not present

## 2020-06-29 DIAGNOSIS — C50911 Malignant neoplasm of unspecified site of right female breast: Secondary | ICD-10-CM

## 2020-06-29 DIAGNOSIS — M8589 Other specified disorders of bone density and structure, multiple sites: Secondary | ICD-10-CM | POA: Diagnosis not present

## 2020-06-29 NOTE — Progress Notes (Signed)
Please let patient know echo showed normal cardiac output. Grade 1 diastolic dysfunction- this can likely be managed by her pcp.

## 2020-06-29 NOTE — Telephone Encounter (Signed)
Oral Oncology Patient Advocate Encounter  Met patient in [place] to complete application for [employer] in an effort to reduce patient's out of pocket expense for Ibrance to $0.    Application completed and faxed to [phone].   [employer] patient assistance phone number for follow up is [phone].   This encounter will be updated until final determination.   [place] Patient [place] Phone [phone] Fax [phone] 06/29/2020 9:34 AM

## 2020-06-29 NOTE — Progress Notes (Signed)
[facility]   [phone] [phone]   Clinic Follow up Note   Patient Care Team: [name], MD as PCP - General (Family Medicine) Fay Records, MD as PCP - Cardiology (Cardiology) Paralee Cancel, MD as Consulting Physician (Orthopedic Surgery) Gatha Mayer, MD as Consulting Physician (Gastroenterology) Clent Jacks, MD as Consulting Physician (Ophthalmology) Roney Jaffe, DDS (Oral Surgery) Everlene Other (Dentistry) [place], Jennefer Bravo, MD as Referring Physician (Dermatology) Dillingham, Loel Lofty, DO as Attending Physician (Plastic Surgery) Truitt Merle, MD as Consulting Physician (Hematology)  Date of Service:  06/29/2020  CHIEF COMPLAINT: F/u of recurrent right breast cancer  SUMMARY OF ONCOLOGIC HISTORY: Oncology History Overview Note  Cancer Staging No matching staging information was found for the patient.    Breast cancer, right breast ([place])  01/2000 Initial Diagnosis   Breast cancer, right breast ([place])  History of primary lobular right breast carcinoma. 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 I believe that she had local radiation, tho that information is not available in this EMR.   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 2008 (tho patient did not agree to surgery until July 2008), invasive lobular carcinoma ER 81%, PR 96%, Her 2 1+ (PM08-99) , 1.5 cm invasive lobular with 3 axillary nodes negative, LVSI and perineural invasion present ([id number]). She subsequently declined adjuvant systemic treatment . She had a complicated course  following right mastectomy, with difficult healing after right breast reconstruction.    07/2009 Relapse/Recurrence   She had skin recurrence lateral right chest wall excised in Sept 2010 then treated with xeloda from Oct 2010 thru Feb 2011 and began on Arimidex July 2011; she may have stopped Arimidex after bone density scan 02-2014, or possibly the year prior (?).    2015 Imaging   Bone density scan [facility] 02-15-14 still osteopenic range, slightly lower in LS compared with 2013 and stable in hips.   05/19/2020 Mammogram   IMPRESSION: No mammographic evidence of malignancy. A result letter of this screening mammogram will be mailed directly to the patient.     06/03/2020 Relapse/Recurrence   skin punch biopsy of the right upper anterior chest wall which shows infiltrating carcinoma, the tumor cells are strongly positive for cytokeratin 7 and GATA-3 staining and are negative for cytokeratin 20, the combined histologic and IHC findings support a diagnosis of breast carcinoma, ER 100% positive with moderate-weak staining intensity, progesterone 0% negative, HER-2 equivocal by IHC and negative by FISH.   Seen in [employer]   06/17/2020 -  Anti-estrogen oral therapy   First-line Letrozole 2.70m once daily starting 06/17/20 and Ibrance    06/23/2020 PET scan   IMPRESSION: 1. No definite hypermetabolic metastatic disease. No hypermetabolic chest wall lesions. 2. Indeterminate small vague focus of mild hypermetabolism (max SUV 4.7) projecting over lateral left hemidiaphragm/superolateral spleen, without a discrete correlate on the noncontrast CT images. Suggest further evaluation with MRI abdomen without and with IV contrast. 3. Aortic Atherosclerosis (ICD10-I70.0) and Emphysema (ICD10-J43.9).   06/30/2020 -  Antibody Plan   First-line AI and Ibrance 1250mfor 3 weeks on/1 week off starting 06/30/20  CURRENT THERAPY:  First-line Letrozole 2.32m once daily starting 06/17/20 and Ibrance 1258mfor 3  weeks on/1 week off starting 06/30/20   INTERVAL HISTORY:  ViSULAMITA LAFOUNTAINs here for a follow up. She presents to the clinic with her husband and doctor. She presents to the clinic alone. She notes having occasional left flank pain, but not recently. She has started Letrozole and tolerating well so far. She is willing to do all workup indicated and proceed with treatment.     REVIEW OF SYSTEMS:   Constitutional: Denies fevers, chills or abnormal weight loss Eyes: Denies blurriness of vision Ears, nose, mouth, throat, and face: Denies mucositis or sore throat Respiratory: Denies cough, dyspnea or wheezes Cardiovascular: Denies palpitation, chest discomfort or lower extremity swelling Gastrointestinal:  Denies nausea, heartburn or change in bowel habits (+) Occasional Left flank pain  Skin: (+) Skin rash of right breast Lymphatics: Denies new lymphadenopathy or easy bruising Neurological:Denies numbness, tingling or new weaknesses Behavioral/Psych: Mood is stable, no new changes  All other systems were reviewed with the patient and are negative.  MEDICAL HISTORY:  Past Medical History:  Diagnosis Date  . ALKALINE PHOSPHATASE, ELEVATED 03/15/2009  . Allergic state 06/10/2012  . Allergy   . Anemia   . Anxiety and depression 04/28/2011  . Arthritis   . Asthma   . Atypical chest pain 11/30/2011  . 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 Baker's cyst of knee 05/22/2011  . Cancer ([facility]01,  08   XRT/chemo 01-02/ lobular invasive ca  . Carotid artery disease ([facility]   a. Carotid duplex 03/2014: stable 1-39% BICA, f/u due 03/2016.  . Marland Kitchenhronic alcoholism in remission (HCA[place]her[place] Junction5/17/2012   Did not tolerate Klonopin, caused some confusion and bad dreams.    . Clotting disorder ([facility]  . COPD (chronic obstructive pulmonary disease) ([facility]  . Dermatitis 11/23/2012  . EE (eosinophilic esophagitis)   . Emphysema of lung (HC[facility]ixa  . Esophageal ring   . ESOPHAGEAL STRICTURE 03/29/2009    . Fall 11/23/2012  . Family history of breast cancer   . Family history of colon cancer   . Family history of ovarian cancer   . Family history of pancreatic cancer   . Folliculitis of nose 12/14/47/7026. GERD (gastroesophageal reflux disease) 09/29/2009   improved s/p cholecystectomy and esophagus dilatation  . History of chicken pox   . History of measles   . History of shingles    2 episodes  . Hx of echocardiogram    a. Echo 01/2013: mild LVH, EF 55-60%, normal wall motion, Gr 1 diast dysfn  . Hyperlipidemia   . Knee pain, bilateral 07/23/2011  . Medicare annual wellness visit, subsequent 06/19/2015  . Mixed hyperlipidemia 10/17/2010   Qualifier: Diagnosis of  By: FeMack Guise  . Orthostasis   . Osteopenia 03/14/2011  . Osteoporosis   . Personal history of chemotherapy 2001  . Personal history of radiation therapy 2001   rt breast  . 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 PVC's (premature ventricular contractions)    a. Event monitor 01/2013: NSR, extensive PVCs.  . Radial neck fracture 10/2011   minimally displaced  . Substance abuse (H[facility]io [place]   In remission 3 years.   . Urinary incontinence 03/19/2012  . Vaginitis 05/22/2011    SURGICAL HISTORY: Past Surgical History:  Procedure Laterality Date  . APPENDECTOMY    . AUGMENTATION MAMMAPLASTY Right 05/27/2007  . BREAST  BIOPSY Right 01/02/2007   wire loc  . BREAST BIOPSY  12/26/2006  . BREAST LUMPECTOMY Right 2001  . BREAST RECONSTRUCTION  2008, 2009, 2010  . BREAST REDUCTION WITH MASTOPEXY Left 05/30/2017   Procedure: LEFT BREAST REDUCTION FOR SYMTERY WITH MASTOPEXY;  Surgeon: Wallace Going, DO;  Location: [facility];  Service: Plastics;  Laterality: Left;  . CHOLECYSTECTOMY  2010  . COLONOSCOPY  09/05/10   cecal avm's  . DENTAL SURGERY  05/2016   4 dental implants by Dr. Loyal Gambler.  [name] ERCP  2010    CBD stone extraction   . ESOPHAGOGASTRODUODENOSCOPY  01/08/2012   Procedure:  ESOPHAGOGASTRODUODENOSCOPY (EGD);  Surgeon: Gatha Mayer, MD;  [facility];  Service: Endoscopy;  Laterality: N/A;  . ESOPHAGOGASTRODUODENOSCOPY (EGD) WITH ESOPHAGEAL DILATION  2010, 2012  . LAPAROSCOPIC APPENDECTOMY N/A 08/04/2016   Procedure: APPENDECTOMY LAPAROSCOPIC;  Surgeon: Michael Boston, MD;  [facility];  Service: General;  Laterality: N/A;  . LIPOSUCTION WITH LIPOFILLING Left 11/21/2017   Procedure: LIPOSUCTION FROM ABDOMEN WITH LIPOFILLING TO LEFT BREAST;  Surgeon: Wallace Going, DO;  Location: [facility];  Service: Plastics;  Laterality: Left;  [name] MASTECTOMY MODIFIED RADICAL Right 05/27/2007   , Mastectomy modified radical (08), breast reconstruction, CA lesions excised lateral abd wall 2010  . MASTOPEXY Left 11/21/2017   Procedure: LEFT BREAST REVISION MASTOPEXY FOR SYMMETRY;  Surgeon: Wallace Going, DO;  Location: [facility];  Service: Plastics;  Laterality: Left;  . REDUCTION MAMMAPLASTY Left   . SAVORY DILATION  01/08/2012   Procedure: SAVORY DILATION;  Surgeon: Gatha Mayer, MD;  [facility];  Service: Endoscopy;  Laterality: N/A;  need xray    I have reviewed the social history and family history with the patient and they are unchanged from previous note.  ALLERGIES:  is allergic to codeine, morphine, peanut-containing drug products, sorbitol, tomato, zofran, advil [ibuprofen], diphenhydramine hcl, and oxycodone.  MEDICATIONS:  Current Outpatient Medications  Medication Sig Dispense Refill  . acetaminophen (TYLENOL) 500 MG tablet Take 500-1,000 mg by mouth every 6 (six) hours as needed (for pain.).    [name] amitriptyline (ELAVIL) 25 MG tablet TAKE 6 TABLETS(150 MG) BY MOUTH AT BEDTIME 180 tablet 2  . atorvastatin (LIPITOR) 20 MG tablet Take 1 tablet (20 mg total) by mouth 2 (two) times daily. 180 tablet 3  . Biotin (BIOTIN 5000) 5 MG CAPS Take by mouth.    . Cholecalciferol (VITAMIN D3) 1000 UNITS CAPS Take  2,000 Units by mouth daily.     . Cyanocobalamin (VITAMIN B 12 PO) Take 1 tablet by mouth daily.    [name] diltiazem (CARDIZEM) 30 MG tablet TAKE 1 TABLET BY MOUTH EVERY DAY FOR PALPITATIONS 90 tablet 2  . Ferrous Fumarate-Folic Acid 101-7 MG TABS Take 1 tablet by mouth daily. 30 each 3  . letrozole (FEMARA) 2.5 MG tablet Take 1 tablet (2.5 mg total) by mouth daily. 90 tablet 3  . loratadine (CLARITIN) 10 MG tablet Take 1 tablet (10 mg total) by mouth daily. 30 tablet 11  . LORazepam (ATIVAN) 1 MG tablet TAKE 1 TABLET(1 MG) BY MOUTH EVERY 8 HOURS AS NEEDED FOR ANXIETY 70 tablet 1  . palbociclib (IBRANCE) 125 MG tablet Take 1 tablet (125 mg total) by mouth daily. Take for 21 days on, 7 days off, repeat every 28 days. 21 tablet 0  . pantoprazole (PROTONIX) 40 MG tablet TAKE 1 TABLET(40 MG) BY MOUTH DAILY 90 tablet 1  .  permethrin (ELIMITE) 5 % cream Apply from scalp to toes, avoid face. Leave on 8-14 hours. Thoroughly rinse with soap and water. Repeat in 2 weeks if needed. 60 g 1  . rOPINIRole (REQUIP) 0.25 MG tablet Take 1 tablet (0.25 mg total) by mouth at bedtime. 30 tablet 0  . Tiotropium Bromide-Olodaterol (STIOLTO RESPIMAT) 2.5-2.5 MCG/ACT AERS Inhale 2 puffs into the lungs daily. 4 g 0  . triamcinolone cream (KENALOG) 0.1 % Apply topically 2 (two) times daily. Apply to affected area 45 g 1   No current facility-administered medications for this visit.    PHYSICAL EXAMINATION: ECOG PERFORMANCE STATUS: 1 - Symptomatic but completely ambulatory  Vitals:   06/29/20 1517  BP: 128/71  Pulse: 93  Resp: 18  Temp: (!) 96 F (35.6 C)  SpO2: 98%   Filed Weights   06/29/20 1517  Weight: 142 lb 6.4 oz (64.6 kg)    Due to COVID19 we will limit examination to appearance. Patient had no complaints.  GENERAL:alert, no distress and comfortable SKIN: skin color normal, no rashes or significant lesions EYES: normal, Conjunctiva are pink and non-injected, sclera clear  NEURO: alert & oriented x 3  with fluent speech   LABORATORY DATA:  I have reviewed the data as listed CBC Latest Ref Rng & Units 05/25/2020 03/14/2020 12/11/2019  WBC 4.0 - 10.5 K/uL 7.0 6.5 5.5  Hemoglobin 12.0 - 15.0 g/dL 12.4 12.2 12.1  Hematocrit 36 - 46 % 39.0 38.2 37.4  Platelets 150 - 400 K/uL 251 264 281     CMP Latest Ref Rng & Units 05/25/2020 12/11/2019 08/06/2019  Glucose 70 - 99 mg/dL 95 98 88  BUN 8 - 23 mg/dL 16 16 19   Creatinine 0.44 - 1.00 mg/dL 0.78 0.74 0.63  Sodium 135 - 145 mmol/L 142 143 140  Potassium 3.5 - 5.1 mmol/L 3.8 3.8 4.5  Chloride 98 - 111 mmol/L 104 105 100  CO2 22 - 32 mmol/L 27 30 31   Calcium 8.9 - 10.3 mg/dL 9.2 9.3 9.9  Total Protein 6.5 - 8.1 g/dL 7.3 7.0 7.0  Total Bilirubin 0.3 - 1.2 mg/dL 0.2(L) 0.4 0.3  Alkaline Phos 38 - 126 U/L 106 94 96  AST 15 - 41 U/L 21 18 19   ALT 0 - 44 U/L 19 21 15       RADIOGRAPHIC STUDIES: I have personally reviewed the radiological images as listed and agreed with the findings in the report. No results found.   ASSESSMENT & PLAN:  Anne [name] is a 79 y.o. female with    1. History of recurrent right breast lobular carcinoma, in 2001, 2008 and chest wall recurrence in 2010, local right breast skin recurrence in 05/2020. ER+, PR-/HER2- -She was initially diagnosed in 01/2000 but unfortunately hadrecurrencetwice and s/p right mastectomy with implant in place. Her last recurrence involvingthe chest wall in 2010. At that time she was treated with Xeloda and Exemestane 2011-2015. -She developed skin rash eruption on right breast in 05/2020 that was not responsive to topical treatments. Her 06/03/20 punch biopsy by dermatology revealed infiltrating breast carcinoma, histologic and IHC findings support a diagnosis of breast carcinoma, ER 100% positive with moderate-weak staining intensity, progesterone 0% negative, HER-2 equivocal by IHC and negative by FISH. I again reviewed her skin biopsy path with her and her family for her understanding.   -Her PET from 06/23/20 showed no definitive hypermetabolic metastatic disease, but I reviewed scan can have limitation.  -Her PET scan also showed small vague hypermetabolic  uptake over her left lateral spleen. I discussed possible further imaging with MRI abdomen, although I have low suspicion this is cancer related. She opted to proceed with MRI, I will order.  -I discussed her case in Breast Tumor Board this morning with breast pathologist who agree this is lobular breast cancer with skin metastasis from her previously breast cancer.  ER positive, moderate to weak staining, PR and HER-2 negative.  It is unlikely this is a false positive.  -Surgeons including Dr Marla Roe and Dr Brantley Stage feel surgery is unlikely feasible given the large area of skin metastasis and uncertain border of disease. Dr Marla Roe is scheduled to consult with her for surgical opinion. Patient is open to this.  -I discussed without complete resection, her cancer is not curable, but still treatable with systemic treatment. Goal of care would be to control disease and prolong life.  -Due to ER+, I recommend treatment with first-line antiestrogen therapy, letrozole in addition to CDK4/6 inhibitor, Ibrance (palbociclib) 125m 3 weeks on/1 week off. She started Letrozole on 06/17/20. Plan to start Ibrance on 06/30/20.  I reviewed the potential benefits and side effects of Ibrance again, she agrees to proceed.  She has very high co-pay, we are working on drug replacement.  She will get the first month supply free through voucher. -Plan to monitor her response with repeat scans and clinically with exams.  -I discussed if she was to have significant response to treatment, she can be re-evaluated for surgery. If she is not responding I would recommend chemotherapy as more aggressive therapy to better control disease.  -f/u in 2 weeks    2. Bone Health  -Given she will be treated with AI for her breast cancer I recommend baseline DEXA as  AI can weaken her bone. She agreed.   3. Left lateral Cervical LN with mild enlargement, small and stable, likely benign.   4. RadiationPulmonaryFibrosis, COPD, Emphysema, Seizures  -She continues to f/u with Pulmonologist and Neurologist Dr HBerdine Addison   5. Diet  -She is vegetarian. I recommended ways to increase protein and work on maintaining her weight. I encouraged her to remain active and with positive mindset to maintain energy and help fight her cancer.  -Per request I will refer her to dietician for management.    PLAN: -Refer to Dietician to discuss high protein diet (she is a vegetarian) -Continue Letrozole  -Start IPrincipal Financialtomorrow  -Lab and F/u in 2 weeks with MRI abdomen a few days before   No problem-specific Assessment & Plan notes found for this encounter.   Orders Placed This Encounter  Procedures  . MR Abdomen W Wo Contrast    Standing Status:   Future    Standing Expiration Date:   06/29/2021    Order Specific Question:   If indicated for the ordered procedure, I authorize the administration of contrast media per Radiology protocol    Answer:   Yes    Order Specific Question:   What is the patient's sedation requirement?    Answer:   No Sedation    Order Specific Question:   Does the patient have a pacemaker or implanted devices?    Answer:   No    Order Specific Question:   Radiology Contrast Protocol - do NOT remove file path    Answer:   [email or web address]    Order Specific Question:   [facility]    [facility]  . Amb Referral  to Nutrition and Diabetic E    Referral Priority:   Routine    Referral Type:   Consultation    Referral Reason:   Specialty Services Required    Number of Visits Requested:   1   All questions were answered. The patient knows to call the clinic with any problems, questions or concerns. No barriers to learning was detected. The total time spent  in the appointment was 30 minutes.     Truitt Merle, MD 06/29/2020   I, Joslyn Devon, am acting as scribe for Truitt Merle, MD.   I have reviewed the above documentation for accuracy and completeness, and I agree with the above.

## 2020-06-30 ENCOUNTER — Telehealth: Payer: Self-pay | Admitting: Hematology

## 2020-06-30 ENCOUNTER — Encounter: Payer: Self-pay | Admitting: Nurse Practitioner

## 2020-06-30 ENCOUNTER — Telehealth (HOSPITAL_COMMUNITY): Payer: Self-pay | Admitting: *Deleted

## 2020-06-30 ENCOUNTER — Encounter: Payer: Self-pay | Admitting: Hematology

## 2020-06-30 NOTE — Telephone Encounter (Signed)
Scheduled per 8/18 los. Pt requested afternoon appts. Pt is aware of appts on 9/1 and 9/3.

## 2020-07-01 ENCOUNTER — Telehealth: Payer: Self-pay | Admitting: Pharmacist

## 2020-07-01 ENCOUNTER — Institutional Professional Consult (permissible substitution): Payer: PPO | Admitting: Plastic Surgery

## 2020-07-01 NOTE — Telephone Encounter (Signed)
Oral Chemotherapy Pharmacist Encounter   Spoke with patient's husband today to follow up regarding patient's oral chemotherapy medication: Ibrance (palbociclib)  Per [employer], Leslee Home can be made into a suspension at the time of dosing. Discussed with husband recommendations for dissolving the Ibrance tablet in 15 mL boiling water, stirring continuously for 2 minutes, followed by adding an additional 15 mL of boiling water and repeating the stirring process until the tablet is completely disintegrated. Once disintegrated, instructed to add 15 mL of room temperature water into the glass, swirl the contents of the cup, and for Ms. Riden to drink all contents in the cup.   Information regarding instructions placed in mail for patient.   Leron Croak, PharmD, BCPS Hematology/Oncology Clinical Pharmacist [facility] [phone] 07/01/2020 12:40 PM

## 2020-07-01 NOTE — Telephone Encounter (Signed)
Patient is approved for Ibrance at no charge through [employer] 07/01/20-11/11/20.  [place] Patient [place] Phone [phone] Fax [phone] 07/01/2020 3:45 PM

## 2020-07-02 ENCOUNTER — Other Ambulatory Visit: Payer: Self-pay | Admitting: Family Medicine

## 2020-07-05 ENCOUNTER — Ambulatory Visit (INDEPENDENT_AMBULATORY_CARE_PROVIDER_SITE_OTHER): Payer: PPO | Admitting: Primary Care

## 2020-07-05 ENCOUNTER — Other Ambulatory Visit: Payer: Self-pay

## 2020-07-05 ENCOUNTER — Encounter: Payer: Self-pay | Admitting: Primary Care

## 2020-07-05 DIAGNOSIS — J4520 Mild intermittent asthma, uncomplicated: Secondary | ICD-10-CM | POA: Diagnosis not present

## 2020-07-05 DIAGNOSIS — K219 Gastro-esophageal reflux disease without esophagitis: Secondary | ICD-10-CM

## 2020-07-05 NOTE — Patient Instructions (Addendum)
Recommendations: - Stay off inhalers until PFTs next week and office visit with Dr. Vaughan Browner. You may need to consider benefits of inhaler vs side effects.  - Continue Protonix 40mg  once daily (30 min prior to breakfast) - Recommend follow up with GI   Follow-up: - September 2nd at 2pm

## 2020-07-05 NOTE — Progress Notes (Signed)
@Patient  ID: Anne [name], female    DOB: 1941/10/08, 79 y.o.   MRN: [id number]  Chief Complaint  Patient presents with   Follow-up    Referring provider: [name], MD  HPI: 79 year old female, former smoker. PMH significant for emphysema, eosinophile esophagitis, breast cancer, palpitations, hyperlididemia. Patient of Dr. Vaughan Browner, last seen on 05/25/20. Progressive dyspnea for past 2 years. No evidence of interstitial lung disease. Minimal obstruction changes on pulmonary function testing. CXR showed emphysema, recommend re-trial inhalers.    Previous [facility] pulmonary encounter: 06/06/2020 Patient presents today for acute visit with reports of shortness of breath. Accompanied by her husband. Shortness of breath has been bothering her a little more since her last office visit with Dr. Vaughan Browner on 05/25/20. Reports associated upper airway congestion with frequent throat clearing.  Her cough is non-productive. She experiences shortness of breath on exertion. States that she can take a deep breath but has difficulty exhaling. She tells me that she was given an inhaler for COPD 2 years ago which she did not use because when she read the instructions it said that it should not be taken if patient has asthma. No chest tightens of wheezing.   07/05/2020- interim hx Patient contacted today for 4 week follow-up. She has hx of asthma with pulmonary hyperinflation and emphysema on imaging. CT in 2019 showed right upper lobe radiation fibrosis. She is intolerant to Trelegy, Symbicort and Stiolto d/t voice hoarseness. Following with Dr. Burr Medico for breast cancer. Recent bx positive. She continue Letrozole, starting Ibrance. Reports GERD symptoms and difficulty swallowing.     Data Reviewed: Imaging CT chest 07/03/2013- postradiation changes in the right upper lobe, centrilobular emphysema, nodular thickening of the pleura at the bases CT abdomen 08/10/17- visualized lung bases show minimal atelectasis, no  interstitial lung disease. CT high-resolution 08/12/2018-moderate emphysema, mild radiation fibrosis in the apical right upper lobe, two-vessel coronary atherosclerosis. I reviewed the images personally.  Labs CBC 04/16/2018-WBC 8.6, eos 1.8%, absolute eosinophil count 155 Allergy profile 05/06/1989-IgE 271, sensitive to dust mite.  Mild allergies to cat, dog, tree pollen  PFTs Spirometry  09/13/2009 FVC 1.65 (49%], FEV1 1.39 (55%], F/F 84  08/11/2018 FVC 2.3 [77%), FEV1 2.3 [100%), F/F 97 Normal test.  PFTs 09/09/2018 FVC 2.62 [1%), FEV1 2.07 [92%), F/F 79, TLC 56%, DLCO 48%, DLCO/VA 76%  FENO 08/11/2018-28   Allergies  Allergen Reactions   Codeine Other (See Comments)    "flu like symptoms"   Morphine Other (See Comments)    "flu like symptoms"   Peanut-Containing Drug Products Hives   Sorbitol Other (See Comments)    GI Issues   Tomato Diarrhea   Zofran Other (See Comments)    headache   Advil [Ibuprofen] Other (See Comments)    Irritates throat.   Diphenhydramine Hcl Other (See Comments)    "nervous and upset"   Oxycodone Anxiety    Confusion with inability to think clearly    Immunization History  Administered Date(s) Administered   Fluad Quad(high Dose 65+) 09/17/2019   Influenza Split 08/27/2011, 10/21/2012   Influenza, High Dose Seasonal PF 08/01/2016, 09/19/2017   Influenza, Quadrivalent, Recombinant, Inj, Pf 08/19/2018   Influenza,inj,Quad PF,6+ Mos 07/31/2013, 12/07/2014, 11/01/2015   PFIZER SARS-COV-2 Vaccination 01/10/2020, 02/09/2020   Tdap 04/08/2013    Past Medical History:  Diagnosis Date   ALKALINE PHOSPHATASE, ELEVATED 03/15/2009   Allergic state 06/10/2012   Allergy    Anemia    Anxiety and depression 04/28/2011   Arthritis  Asthma    Atypical chest pain 11/30/2011   AVM (arteriovenous malformation) of colon 2011   cecum   Baker's cyst of knee 05/22/2011   Cancer ([place]) 01,  08   XRT/chemo 01-02/ lobular  invasive ca   Carotid artery disease ([place])    a. Carotid duplex 03/2014: stable 1-39% BICA, f/u due 03/2016.   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     Clotting disorder (HCC)    COPD (chronic obstructive pulmonary disease) (HCC)    Dermatitis 05/02/3085   EE (eosinophilic esophagitis)    Emphysema of lung (HCC)    Esophageal ring    ESOPHAGEAL STRICTURE 03/29/2009   Fall 11/23/2012   Family history of breast cancer    Family history of colon cancer    Family history of ovarian cancer    Family history of pancreatic cancer    Folliculitis of nose 5/78/4696   GERD (gastroesophageal reflux disease) 09/29/2009   improved s/p cholecystectomy and esophagus dilatation   History of chicken pox    History of measles    History of shingles    2 episodes   Hx of echocardiogram    a. Echo 01/2013: mild LVH, EF 55-60%, normal wall motion, Gr 1 diast dysfn   Hyperlipidemia    Knee pain, bilateral 07/23/2011   Medicare annual wellness visit, subsequent 06/19/2015   Mixed hyperlipidemia 10/17/2010   Qualifier: Diagnosis of  By: Mack Guise     Orthostasis    Osteopenia 03/14/2011   Osteoporosis    Personal history of chemotherapy 2001   Personal history of radiation therapy 2001   rt breast   PERSONAL HX BREAST CANCER 09/29/2009   PVC's (premature ventricular contractions)    a. Event monitor 01/2013: NSR, extensive PVCs.   Radial neck fracture 10/2011   minimally displaced   Substance abuse ([place])    In remission 3 years.    Urinary incontinence 03/19/2012   Vaginitis 05/22/2011    Tobacco History: Social History   Tobacco Use  Smoking Status Former Smoker   Packs/day: 1.00   Years: 50.00   Pack years: 50.00   Types: Cigarettes   Quit date: 05/22/2007   Years since quitting: 13.1  Smokeless Tobacco Never Used   Counseling given: Not Answered   Outpatient  Medications Prior to Visit  Medication Sig Dispense Refill   acetaminophen (TYLENOL) 500 MG tablet Take 500-1,000 mg by mouth every 6 (six) hours as needed (for pain.).     amitriptyline (ELAVIL) 25 MG tablet TAKE 6 TABLETS(150 MG) BY MOUTH AT BEDTIME 180 tablet 2   atorvastatin (LIPITOR) 20 MG tablet Take 1 tablet (20 mg total) by mouth 2 (two) times daily. 180 tablet 3   Biotin (BIOTIN 5000) 5 MG CAPS Take by mouth.     Cholecalciferol (VITAMIN D3) 1000 UNITS CAPS Take 2,000 Units by mouth daily.      Cyanocobalamin (VITAMIN B 12 PO) Take 1 tablet by mouth daily.     diltiazem (CARDIZEM) 30 MG tablet TAKE 1 TABLET BY MOUTH EVERY DAY FOR PALPITATIONS 90 tablet 2   Ferrous Fumarate-Folic Acid 295-2 MG TABS Take 1 tablet by mouth daily. 30 each 3   letrozole (FEMARA) 2.5 MG tablet Take 1 tablet (2.5 mg total) by mouth daily. 90 tablet 3   loratadine (CLARITIN) 10 MG tablet Take 1 tablet (10 mg total) by mouth daily. 30 tablet 11   LORazepam (ATIVAN) 1  MG tablet TAKE 1 TABLET(1 MG) BY MOUTH EVERY 8 HOURS AS NEEDED FOR ANXIETY 70 tablet 1   palbociclib (IBRANCE) 125 MG tablet Take 1 tablet (125 mg total) by mouth daily. Take for 21 days on, 7 days off, repeat every 28 days. 21 tablet 0   pantoprazole (PROTONIX) 40 MG tablet TAKE 1 TABLET(40 MG) BY MOUTH DAILY 90 tablet 1   permethrin (ELIMITE) 5 % cream Apply from scalp to toes, avoid face. Leave on 8-14 hours. Thoroughly rinse with soap and water. Repeat in 2 weeks if needed. 60 g 1   rOPINIRole (REQUIP) 0.25 MG tablet Take 1 tablet (0.25 mg total) by mouth at bedtime. 30 tablet 0   triamcinolone cream (KENALOG) 0.1 % Apply topically 2 (two) times daily. Apply to affected area 45 g 1   Tiotropium Bromide-Olodaterol (STIOLTO RESPIMAT) 2.5-2.5 MCG/ACT AERS Inhale 2 puffs into the lungs daily. (Patient not taking: Reported on 07/05/2020) 4 g 0   No facility-administered medications prior to visit.   Review of Systems  Review of  Systems  Constitutional: Negative.   HENT:       Voice hoarseness  Respiratory: Positive for shortness of breath. Negative for cough, chest tightness and wheezing.   Gastrointestinal:       Difficulty swallowing    Physical Exam  BP 126/70 (BP Location: Left Arm, Cuff Size: Normal)    Pulse (!) 102    Temp 97.8 F (36.6 C) (Temporal)    Ht 5' 5.5" (1.664 m)    Wt 141 lb 12.8 oz (64.3 kg)    SpO2 92%    BMI 23.24 kg/m  Physical Exam Constitutional:      Appearance: Normal appearance.  HENT:     Head: Normocephalic and atraumatic.  Cardiovascular:     Rate and Rhythm: Normal rate and regular rhythm.  Pulmonary:     Breath sounds: Rales present.     Comments: Rales right upper lobe  Skin:    General: Skin is warm and dry.     Comments: Right breast with skin puckering and mild erythema    Neurological:     General: No focal deficit present.     Mental Status: She is alert and oriented to person, place, and time. Mental status is at baseline.  Psychiatric:        Mood and Affect: Mood normal.        Behavior: Behavior normal.        Thought Content: Thought content normal.        Judgment: Judgment normal.      Lab Results:  CBC    Component Value Date/Time   WBC 7.0 05/25/2020 1424   WBC 6.6 04/28/2019 1430   RBC 4.02 05/25/2020 1424   HGB 12.4 05/25/2020 1424   HGB 11.7 09/04/2017 1527   HCT 39.0 05/25/2020 1424   HCT 35.4 09/04/2017 1527   PLT 251 05/25/2020 1424   PLT 231 09/04/2017 1527   MCV 97.0 05/25/2020 1424   MCV 93.6 09/04/2017 1527   MCH 30.8 05/25/2020 1424   MCHC 31.8 05/25/2020 1424   RDW 12.8 05/25/2020 1424   RDW 14.7 (H) 09/04/2017 1527   LYMPHSABS 1.8 05/25/2020 1424   LYMPHSABS 1.8 09/04/2017 1527   MONOABS 0.6 05/25/2020 1424   MONOABS 0.4 09/04/2017 1527   EOSABS 0.2 05/25/2020 1424   EOSABS 0.2 09/04/2017 1527   BASOSABS 0.1 05/25/2020 1424   BASOSABS 0.0 09/04/2017 1527    BMET  Component Value Date/Time   NA 142 05/25/2020  1424   NA 141 09/04/2017 1528   K 3.8 05/25/2020 1424   K 3.8 09/04/2017 1528   CL 104 05/25/2020 1424   CL 102 11/28/2012 1043   CO2 27 05/25/2020 1424   CO2 26 09/04/2017 1528   GLUCOSE 95 05/25/2020 1424   GLUCOSE 97 09/04/2017 1528   GLUCOSE 94 11/28/2012 1043   BUN 16 05/25/2020 1424   BUN 13.2 09/04/2017 1528   CREATININE 0.78 05/25/2020 1424   CREATININE 0.8 09/04/2017 1528   CALCIUM 9.2 05/25/2020 1424   CALCIUM 9.5 09/04/2017 1528   GFRNONAA >60 05/25/2020 1424   GFRAA >60 05/25/2020 1424    BNP No results found for: BNP  ProBNP No results found for: PROBNP  Imaging: NM PET Image Initial (PI) Skull Base To Thigh  Result Date: 06/24/2020 CLINICAL DATA:  Subsequent treatment strategy for right breast cancer originally diagnosed 2001 with chest wall recurrence in 2008, 2010 and again now. EXAM: NUCLEAR MEDICINE PET SKULL BASE TO THIGH TECHNIQUE: 1.5 mCi F-18 FDG was injected intravenously. Full-ring PET imaging was performed from the skull base to thigh after the radiotracer. CT data was obtained and used for attenuation correction and anatomic localization. Fasting blood glucose: 97 mg/dl COMPARISON:  08/12/2018 high-resolution chest CT. 08/10/2017 CT abdomen/pelvis. 05/14/2012 PET-CT. FINDINGS: Mediastinal blood pool activity: SUV max 2.4 Liver activity: SUV max NA NECK: No hypermetabolic lymph nodes in the neck. Incidental CT findings: none CHEST: Right mastectomy with right breast prosthesis. Surgical clips in the right axilla. No hypermetabolic or enlarged axillary, mediastinal or hilar lymph nodes. No hypermetabolic chest wall lesions. No hypermetabolic pulmonary findings. Incidental CT findings: Coronary atherosclerosis. Atherosclerotic nonaneurysmal thoracic aorta. Moderate centrilobular emphysema. Mild sharply marginated consolidation in the anterior apical right upper lobe compatible with mild radiation fibrosis. No acute consolidative airspace disease, lung masses or  significant pulmonary nodules. ABDOMEN/PELVIS: There is a small vague focus of mild hypermetabolism (max SUV 4.7) projecting over the superolateral spleen/lateral left hemidiaphragm without a discrete correlate on the noncontrast CT images. Spleen is normal size. No abnormal hypermetabolic activity within the liver, pancreas or adrenal glands. No hypermetabolic lymph nodes in the abdomen or pelvis. Incidental CT findings: Cholecystectomy. Tiny granulomatous splenic calcifications. Atherosclerotic nonaneurysmal abdominal aorta. SKELETON: No focal hypermetabolic activity to suggest skeletal metastasis. Incidental CT findings: none IMPRESSION: 1. No definite hypermetabolic metastatic disease. No hypermetabolic chest wall lesions. 2. Indeterminate small vague focus of mild hypermetabolism (max SUV 4.7) projecting over lateral left hemidiaphragm/superolateral spleen, without a discrete correlate on the noncontrast CT images. Suggest further evaluation with MRI abdomen without and with IV contrast. 3. Aortic Atherosclerosis (ICD10-I70.0) and Emphysema (ICD10-J43.9). Electronically Signed   By: Ilona Sorrel M.D.   On: 06/24/2020 11:40   ECHOCARDIOGRAM COMPLETE  Result Date: 06/22/2020    ECHOCARDIOGRAM REPORT   Patient Name:   Anne [name] Date of Exam: 06/22/2020 Medical Rec #:  [id number]       Height:       65.5 in Accession #:    [id number]      Weight:       141.7 lb Date of Birth:  01-30-1941      BSA:          1.718 m Patient Age:    75 years        BP:           110/64 mmHg Patient Gender: F  HR:           94 bpm. Exam Location:  [address] Procedure: 2D Echo, 3D Echo, Cardiac Doppler, Color Doppler and Strain Analysis Indications:    R06.02 Shortness of breath  History:        Patient has prior history of Echocardiogram examinations, most                 recent 02/28/2018. Carotid Disease, Arrythmias:Palpitation,                 Signs/Symptoms:Shortness of Breath; Risk Factors:Former Smoker.                  Emphysema. Breast cancer. Anemia.  Sonographer:    Jessee Avers, RDCS Referring Phys: [id number] [place]  1. Left ventricular ejection fraction, by estimation, is 60 to 65%. The left ventricle has normal function. The left ventricle has no regional wall motion abnormalities. Left ventricular diastolic parameters are consistent with Grade I diastolic dysfunction (impaired relaxation). The average left ventricular global longitudinal strain is -19.8 %. The global longitudinal strain is normal.  2. Right ventricular systolic function is normal. The right ventricular size is normal.  3. The mitral valve is normal in structure. Trivial mitral valve regurgitation. No evidence of mitral stenosis.  4. The aortic valve is normal in structure. Aortic valve regurgitation is not visualized. No aortic stenosis is present.  5. The inferior vena cava is normal in size with greater than 50% respiratory variability, suggesting right atrial pressure of 3 mmHg. Comparison(s): 02/28/18 EF 55-60%. FINDINGS  Left Ventricle: Left ventricular ejection fraction, by estimation, is 60 to 65%. The left ventricle has normal function. The left ventricle has no regional wall motion abnormalities. The average left ventricular global longitudinal strain is -19.8 %. The global longitudinal strain is normal. The left ventricular internal cavity size was normal in size. There is no left ventricular hypertrophy. Left ventricular diastolic parameters are consistent with Grade I diastolic dysfunction (impaired relaxation). Right Ventricle: The right ventricular size is normal. No increase in right ventricular wall thickness. Right ventricular systolic function is normal. Left Atrium: Left atrial size was normal in size. Right Atrium: Right atrial size was normal in size. Pericardium: There is no evidence of pericardial effusion. Mitral Valve: The mitral valve is normal in structure. Normal mobility of the mitral valve  leaflets. Trivial mitral valve regurgitation. No evidence of mitral valve stenosis. Tricuspid Valve: The tricuspid valve is normal in structure. Tricuspid valve regurgitation is trivial. No evidence of tricuspid stenosis. Aortic Valve: The aortic valve is normal in structure. Aortic valve regurgitation is not visualized. No aortic stenosis is present. Pulmonic Valve: The pulmonic valve was normal in structure. Pulmonic valve regurgitation is not visualized. No evidence of pulmonic stenosis. Aorta: The aortic root is normal in size and structure. Venous: The inferior vena cava is normal in size with greater than 50% respiratory variability, suggesting right atrial pressure of 3 mmHg. IAS/Shunts: No atrial level shunt detected by color flow Doppler.  LEFT VENTRICLE PLAX 2D LVIDd:         3.70 cm  Diastology LVIDs:         2.30 cm  LV e' lateral:   5.68 cm/s LV PW:         1.00 cm  LV E/e' lateral: 10.5 LV IVS:        1.20 cm  LV e' medial:    4.46 cm/s LVOT diam:     2.50  cm  LV E/e' medial:  13.4 LV SV:         88 LV SV Index:   51       2D Longitudinal Strain LVOT Area:     4.91 cm 2D Strain GLS (A2C):   -19.7 %                         2D Strain GLS (A3C):   -18.8 %                         2D Strain GLS (A4C):   -21.0 %                         2D Strain GLS Avg:     -19.8 %                          3D Volume EF:                         3D EF:        55 %                         LV EDV:       76 ml                         LV ESV:       35 ml                         LV SV:        42 ml RIGHT VENTRICLE RV Basal diam:  2.40 cm RV S prime:     11.50 cm/s TAPSE (M-mode): 1.7 cm LEFT ATRIUM             Index       RIGHT ATRIUM           Index LA diam:        3.10 cm 1.80 cm/m  RA Pressure: 3.00 mmHg LA Vol (A2C):   22.8 ml 13.27 ml/m RA Area:     7.75 cm LA Vol (A4C):   27.3 ml 15.89 ml/m RA Volume:   14.00 ml  8.15 ml/m LA Biplane Vol: 26.4 ml 15.36 ml/m  AORTIC VALVE LVOT Vmax:   90.20 cm/s LVOT Vmean:  59.900  cm/s LVOT VTI:    0.179 m  AORTA Ao Root diam: 3.30 cm Ao Asc diam:  3.60 cm MITRAL VALVE               TRICUSPID VALVE                            Estimated RAP:  3.00 mmHg  MV E velocity: 59.70 cm/s  SHUNTS MV A velocity: 83.90 cm/s  Systemic VTI:  0.18 m MV E/A ratio:  0.71        Systemic Diam: 2.50 cm Candee Furbish MD Electronically signed by Candee Furbish MD Signature Date/Time: 06/22/2020/3:50:49 PM    Final      Assessment & Plan:   Mild intermittent asthma without complication - Patient has hx asthma and emphysema on imaging. Intolerant to Trelegy, Arts development officer.  - Plan stay off  inhalers until PFTs next week and office visit with Dr. Vaughan Browner. You may need to consider benefits of inhaler vs side effects.  - Follow-up: September 2nd at 2pm     GERD - Patient reports voice hoarseness, GERD and dif swallowing. She has hx eosinophilic esophagitis. Continue Protonix 40mg  once daily. Recommend follow-up with GI.      Martyn Ehrich, NP 07/05/2020

## 2020-07-05 NOTE — Assessment & Plan Note (Signed)
-   Patient reports voice hoarseness, GERD and dif swallowing. She has hx eosinophilic esophagitis. Continue Protonix 40mg  once daily. Recommend follow-up with GI.

## 2020-07-05 NOTE — Assessment & Plan Note (Signed)
-   Patient has hx asthma and emphysema on imaging. Intolerant to Trelegy, Arts development officer.  - Plan stay off inhalers until PFTs next week and office visit with Dr. Vaughan Browner. You may need to consider benefits of inhaler vs side effects.  - Follow-up: September 2nd at 2pm

## 2020-07-06 ENCOUNTER — Encounter: Payer: Self-pay | Admitting: Family Medicine

## 2020-07-06 ENCOUNTER — Encounter: Payer: Self-pay | Admitting: Hematology

## 2020-07-06 ENCOUNTER — Other Ambulatory Visit: Payer: Self-pay | Admitting: Family Medicine

## 2020-07-06 DIAGNOSIS — Z853 Personal history of malignant neoplasm of breast: Secondary | ICD-10-CM

## 2020-07-06 NOTE — Progress Notes (Unsigned)
amb ref °

## 2020-07-07 ENCOUNTER — Encounter: Payer: Self-pay | Admitting: Hematology

## 2020-07-07 ENCOUNTER — Telehealth: Payer: Self-pay

## 2020-07-07 ENCOUNTER — Telehealth: Payer: Self-pay | Admitting: Family Medicine

## 2020-07-07 NOTE — Telephone Encounter (Signed)
New Message:   Anne [name] is calling to inform us that [facility] called them in reference to the pt's referral that was sent and states that [employer] called from [phone] and states the pt will be out of network and advise that the pt stay at the [facility] for treatment. Please advise.

## 2020-07-07 NOTE — Telephone Encounter (Signed)
[employer] via [facility] called stating they do not accept [employer] at [facility] and pt needs to f/u in-network [phone]

## 2020-07-07 NOTE — Telephone Encounter (Signed)
Please let patient know [facility] does not accept her insurance, it is out of network

## 2020-07-08 NOTE — Telephone Encounter (Signed)
Left message on machine to call back  

## 2020-07-08 NOTE — Telephone Encounter (Signed)
Anne [name] - the pt requested the referral to go to [facility] so it would be up to her if she wants to pay out of network or stay with [facility]. She requested a second opinion. Please let me know if she would like referral sent elsewhere.

## 2020-07-09 ENCOUNTER — Other Ambulatory Visit: Payer: Self-pay

## 2020-07-09 ENCOUNTER — Ambulatory Visit (HOSPITAL_COMMUNITY)
Admission: RE | Admit: 2020-07-09 | Discharge: 2020-07-09 | Disposition: A | Discharge status: Home / Self Care | Payer: PPO | Source: Ambulatory Visit | Attending: Hematology | Admitting: Hematology

## 2020-07-09 DIAGNOSIS — C792 Secondary malignant neoplasm of skin: Secondary | ICD-10-CM | POA: Insufficient documentation

## 2020-07-09 DIAGNOSIS — C50911 Malignant neoplasm of unspecified site of right female breast: Secondary | ICD-10-CM | POA: Diagnosis not present

## 2020-07-09 DIAGNOSIS — D7389 Other diseases of spleen: Secondary | ICD-10-CM | POA: Diagnosis not present

## 2020-07-09 DIAGNOSIS — K8689 Other specified diseases of pancreas: Secondary | ICD-10-CM | POA: Diagnosis not present

## 2020-07-09 DIAGNOSIS — Z9011 Acquired absence of right breast and nipple: Secondary | ICD-10-CM | POA: Diagnosis not present

## 2020-07-09 DIAGNOSIS — C50919 Malignant neoplasm of unspecified site of unspecified female breast: Secondary | ICD-10-CM | POA: Diagnosis not present

## 2020-07-09 MED ORDER — GADOBUTROL 1 MMOL/ML IV SOLN
7.0000 mL | Freq: Once | INTRAVENOUS | Status: AC | PRN
  Administered 2020-07-09: 7 mL via INTRAVENOUS

## 2020-07-11 NOTE — Telephone Encounter (Signed)
2nd. Attempt, left vm to return call.  RC/CMA-8/30

## 2020-07-12 NOTE — Telephone Encounter (Signed)
Several Attempts has been made, regarding referral and no reply.

## 2020-07-13 ENCOUNTER — Other Ambulatory Visit: Payer: Self-pay

## 2020-07-13 ENCOUNTER — Inpatient Hospital Stay: Payer: PPO | Attending: Hematology

## 2020-07-13 DIAGNOSIS — Z888 Allergy status to other drugs, medicaments and biological substances status: Secondary | ICD-10-CM | POA: Insufficient documentation

## 2020-07-13 DIAGNOSIS — Z885 Allergy status to narcotic agent status: Secondary | ICD-10-CM | POA: Insufficient documentation

## 2020-07-13 DIAGNOSIS — Z170 Estrogen receptor positive status [ER+]: Secondary | ICD-10-CM | POA: Insufficient documentation

## 2020-07-13 DIAGNOSIS — C50911 Malignant neoplasm of unspecified site of right female breast: Secondary | ICD-10-CM | POA: Insufficient documentation

## 2020-07-13 DIAGNOSIS — I700 Atherosclerosis of aorta: Secondary | ICD-10-CM | POA: Insufficient documentation

## 2020-07-13 DIAGNOSIS — Z79899 Other long term (current) drug therapy: Secondary | ICD-10-CM | POA: Insufficient documentation

## 2020-07-13 DIAGNOSIS — R21 Rash and other nonspecific skin eruption: Secondary | ICD-10-CM | POA: Insufficient documentation

## 2020-07-13 DIAGNOSIS — R569 Unspecified convulsions: Secondary | ICD-10-CM | POA: Insufficient documentation

## 2020-07-13 DIAGNOSIS — M8589 Other specified disorders of bone density and structure, multiple sites: Secondary | ICD-10-CM | POA: Insufficient documentation

## 2020-07-13 DIAGNOSIS — Z79811 Long term (current) use of aromatase inhibitors: Secondary | ICD-10-CM | POA: Insufficient documentation

## 2020-07-13 DIAGNOSIS — K209 Esophagitis, unspecified without bleeding: Secondary | ICD-10-CM | POA: Insufficient documentation

## 2020-07-13 DIAGNOSIS — Z9882 Breast implant status: Secondary | ICD-10-CM | POA: Insufficient documentation

## 2020-07-13 DIAGNOSIS — J439 Emphysema, unspecified: Secondary | ICD-10-CM | POA: Insufficient documentation

## 2020-07-13 DIAGNOSIS — C792 Secondary malignant neoplasm of skin: Secondary | ICD-10-CM | POA: Insufficient documentation

## 2020-07-13 DIAGNOSIS — Z8041 Family history of malignant neoplasm of ovary: Secondary | ICD-10-CM | POA: Insufficient documentation

## 2020-07-13 DIAGNOSIS — Z923 Personal history of irradiation: Secondary | ICD-10-CM | POA: Insufficient documentation

## 2020-07-13 DIAGNOSIS — Z9221 Personal history of antineoplastic chemotherapy: Secondary | ICD-10-CM | POA: Insufficient documentation

## 2020-07-13 DIAGNOSIS — N644 Mastodynia: Secondary | ICD-10-CM | POA: Insufficient documentation

## 2020-07-13 DIAGNOSIS — Z803 Family history of malignant neoplasm of breast: Secondary | ICD-10-CM | POA: Insufficient documentation

## 2020-07-13 DIAGNOSIS — Z886 Allergy status to analgesic agent status: Secondary | ICD-10-CM | POA: Insufficient documentation

## 2020-07-13 DIAGNOSIS — Z800 Family history of malignant neoplasm of digestive organs: Secondary | ICD-10-CM | POA: Insufficient documentation

## 2020-07-13 DIAGNOSIS — Z6372 Alcoholism and drug addiction in family: Secondary | ICD-10-CM | POA: Insufficient documentation

## 2020-07-13 DIAGNOSIS — R682 Dry mouth, unspecified: Secondary | ICD-10-CM | POA: Insufficient documentation

## 2020-07-13 DIAGNOSIS — R590 Localized enlarged lymph nodes: Secondary | ICD-10-CM | POA: Insufficient documentation

## 2020-07-13 DIAGNOSIS — R6884 Jaw pain: Secondary | ICD-10-CM | POA: Insufficient documentation

## 2020-07-13 DIAGNOSIS — Z9049 Acquired absence of other specified parts of digestive tract: Secondary | ICD-10-CM | POA: Insufficient documentation

## 2020-07-13 NOTE — Progress Notes (Signed)
[place]   [phone] [phone]   Clinic Follow up Note   Patient Care Team: [name], MD as PCP - General (Family Medicine) Fay Records, MD as PCP - Cardiology (Cardiology) Paralee Cancel, MD as Consulting Physician (Orthopedic Surgery) Gatha Mayer, MD as Consulting Physician (Gastroenterology) Clent Jacks, MD as Consulting Physician (Ophthalmology) Roney Jaffe, DDS (Oral Surgery) Everlene Other (Dentistry) Haverstock, Jennefer Bravo, MD as Referring Physician (Dermatology) Dillingham, Loel Lofty, DO as Attending Physician (Plastic Surgery) Truitt Merle, MD as Consulting Physician (Hematology)  Date of Service:  07/15/2020  CHIEF COMPLAINT:   SUMMARY OF ONCOLOGIC HISTORY: Oncology History Overview Note  Cancer Staging No matching staging information was found for the patient.    Breast cancer, right breast ([place])  01/2000 Initial Diagnosis   Breast cancer, right breast ([place])  History of primary lobular right breast carcinoma. 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 I believe that she had local radiation, tho that information is not available in this EMR.   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 2008 (tho patient did not agree to surgery until July 2008), invasive lobular carcinoma ER 81%, PR 96%, Her 2 1+ (PM08-99) , 1.5 cm invasive lobular with 3 axillary nodes negative, LVSI and perineural invasion present ([id number]). She subsequently declined adjuvant systemic treatment . She had a complicated course following right mastectomy, with  difficult healing after right breast reconstruction.    07/2009 Relapse/Recurrence   She had skin recurrence lateral right chest wall excised in Sept 2010 then treated with xeloda from Oct 2010 thru Feb 2011 and began on Arimidex July 2011; she may have stopped Arimidex after bone density scan 02-2014, or possibly the year prior (?).    2015 Imaging   Bone density scan [facility] 02-15-14 still osteopenic range, slightly lower in LS compared with 2013 and stable in hips.   05/19/2020 Mammogram   IMPRESSION: No mammographic evidence of malignancy. A result letter of this screening mammogram will be mailed directly to the patient.     06/03/2020 Relapse/Recurrence   skin punch biopsy of the right upper anterior chest wall which shows infiltrating carcinoma, the tumor cells are strongly positive for cytokeratin 7 and GATA-3 staining and are negative for cytokeratin 20, the combined histologic and IHC findings support a diagnosis of breast carcinoma, ER 100% positive with moderate-weak staining intensity, progesterone 0% negative, HER-2 equivocal by IHC and negative by FISH.   Seen in [employer]   06/17/2020 -  Anti-estrogen oral therapy   First-line Letrozole 2.45m once daily starting 06/17/20 and Ibrance    06/23/2020 PET scan   IMPRESSION: 1. No definite hypermetabolic metastatic disease. No hypermetabolic chest wall lesions. 2. Indeterminate small vague focus of mild hypermetabolism (max SUV 4.7) projecting over lateral left hemidiaphragm/superolateral spleen, without a discrete correlate on the noncontrast CT images. Suggest further evaluation with MRI abdomen without and with IV contrast. 3. Aortic Atherosclerosis (ICD10-I70.0) and Emphysema (ICD10-J43.9).   06/30/2020 -  Antibody Plan   First-line AI and Ibrance 1213mfor 3 weeks on/1 week off starting 06/30/20      CURRENT THERAPY:  First-line Letrozole 2.72m once daily starting 06/17/20 and Ibrance 1271mfor 3 weeks on/1 week off starting  06/30/20, but only took 1 dose Ibrance due to side effect concerns. Will restart on 07/18/20.   INTERVAL HISTORY:  Anne [name] here for a follow up of recurrent right breast cancer. She presents to the clinic alone. She notes she took only 1 dose of dissolved Ibrance. She tolerated the Ibrance well that way. She did not continue due to concern of possible side effects. She is still on Letrozole and tolerating well. She notes having some fatigue but overall manageable. She did not see Dr DiMarla Roeecause she did not feel well day of visit. She has not rescheduled. Her husband notes she has redundant colon and has BM once a week. She has been on Benfiber and drink more water, now 48 ounces a day. If constipated she takes 1 dulcolax.     REVIEW OF SYSTEMS:   Constitutional: Denies fevers, chills or abnormal weight loss Eyes: Denies blurriness of vision Ears, nose, mouth, throat, and face: Denies mucositis or sore throat Respiratory: Denies cough, dyspnea or wheezes Cardiovascular: Denies palpitation, chest discomfort or lower extremity swelling Gastrointestinal:  Denies nausea, heartburn or change in bowel habits Skin: Denies abnormal skin rashes Lymphatics: Denies new lymphadenopathy or easy bruising Neurological:Denies numbness, tingling or new weaknesses Behavioral/Psych: Mood is stable, no new changes  All other systems were reviewed with the patient and are negative.  MEDICAL HISTORY:  Past Medical History:  Diagnosis Date  . ALKALINE PHOSPHATASE, ELEVATED 03/15/2009  . Allergic state 06/10/2012  . Allergy   . Anemia   . Anxiety and depression 04/28/2011  . Arthritis   . Asthma   . Atypical chest pain 11/30/2011  . 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 Baker's cyst of knee 05/22/2011  . Cancer ([place]  08   XRT/chemo 01-02/ lobular invasive ca  . Carotid artery disease ([place]   a. Carotid duplex 03/2014: stable 1-39% BICA, f/u due 03/2016.  . Marland Kitchenhronic  alcoholism in remission ([place]/17/2012   Did not tolerate Klonopin, caused some confusion and bad dreams.    . Clotting disorder ([facility]  . COPD (chronic obstructive pulmonary disease) ([facility]  . Dermatitis 11/23/2012  . EE (eosinophilic esophagitis)   . Emphysema of lung ([facility]  . Esophageal ring   . ESOPHAGEAL STRICTURE 03/29/2009  . Fall 11/23/2012  . Family history of breast cancer   . Family history of colon cancer   . Family history of ovarian cancer   . Family history of pancreatic cancer   . Folliculitis of nose 12/13/94/2229. GERD (gastroesophageal reflux disease) 09/29/2009   improved s/p cholecystectomy and esophagus dilatation  . History of chicken pox   . History of measles   . History of shingles    2 episodes  . Hx of echocardiogram    a. Echo 01/2013: mild LVH, EF 55-60%, normal wall motion, Gr 1 diast dysfn  . Hyperlipidemia   . Knee pain, bilateral 07/23/2011  . Medicare annual wellness visit, subsequent 06/19/2015  . Mixed hyperlipidemia 10/17/2010   Qualifier: Diagnosis of  By: FeMack Guise  . Orthostasis   . Osteopenia 03/14/2011  . Osteoporosis   . Personal history of chemotherapy 2001  . Personal history of radiation therapy 2001   rt breast  . 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 PVC's (premature ventricular contractions)    a. Event  monitor 01/2013: NSR, extensive PVCs.  . Radial neck fracture 10/2011   minimally displaced  . Substance abuse ([place])    In remission 3 years.   . Urinary incontinence 03/19/2012  . Vaginitis 05/22/2011    SURGICAL HISTORY: Past Surgical History:  Procedure Laterality Date  . APPENDECTOMY    . AUGMENTATION MAMMAPLASTY Right 05/27/2007  . BREAST BIOPSY Right 01/02/2007   wire loc  . BREAST BIOPSY  12/26/2006  . BREAST LUMPECTOMY Right 2001  . BREAST RECONSTRUCTION  2008, 2009, 2010  . BREAST REDUCTION WITH MASTOPEXY Left 05/30/2017   Procedure: LEFT BREAST REDUCTION FOR SYMTERY WITH MASTOPEXY;  Surgeon: Wallace Going, DO;  Location: [facility];  Service: Plastics;  Laterality: Left;  . CHOLECYSTECTOMY  2010  . COLONOSCOPY  09/05/10   cecal avm's  . DENTAL SURGERY  05/2016   4 dental implants by Dr. Loyal Gambler.  [name] ERCP  2010    CBD stone extraction   . ESOPHAGOGASTRODUODENOSCOPY  01/08/2012   Procedure: ESOPHAGOGASTRODUODENOSCOPY (EGD);  Surgeon: Gatha Mayer, MD;  Location[facility];  Service: Endoscopy;  Laterality: N/A;  . ESOPHAGOGASTRODUODENOSCOPY (EGD) WITH ESOPHAGEAL DILATION  2010, 2012  . LAPAROSCOPIC APPENDECTOMY N/A 08/04/2016   Procedure: APPENDECTOMY LAPAROSCOPIC;  Surgeon: Michael Boston, MD;  Location: [facility];  Service: General;  Laterality: N/A;  . LIPOSUCTION WITH LIPOFILLING Left 11/21/2017   Procedure: LIPOSUCTION FROM ABDOMEN WITH LIPOFILLING TO LEFT BREAST;  Surgeon: Wallace Going, DO;  Location: [facility];  Service: Plastics;  Laterality: Left;  [name] MASTECTOMY MODIFIED RADICAL Right 05/27/2007   , Mastectomy modified radical (08), breast reconstruction, CA lesions excised lateral abd wall 2010  . MASTOPEXY Left 11/21/2017   Procedure: LEFT BREAST REVISION MASTOPEXY FOR SYMMETRY;  Surgeon: Wallace Going, DO;  Location: [facility];  Service: Plastics;  Laterality: Left;  . REDUCTION MAMMAPLASTY Left   . SAVORY DILATION  01/08/2012   Procedure: SAVORY DILATION;  Surgeon: Gatha Mayer, MD;  Location: [facility];  Service: Endoscopy;  Laterality: N/A;  need xray    I have reviewed the social history and family history with the patient and they are unchanged from previous note.  ALLERGIES:  is allergic to codeine, morphine, peanut-containing drug products, sorbitol, tomato, zofran, advil [ibuprofen], diphenhydramine hcl, and oxycodone.  MEDICATIONS:  Current Outpatient Medications  Medication Sig Dispense Refill  . acetaminophen (TYLENOL) 500 MG tablet Take 500-1,000 mg by mouth every 6 (six) hours as needed (for  pain.).    [name] amitriptyline (ELAVIL) 25 MG tablet TAKE 6 TABLETS(150 MG) BY MOUTH AT BEDTIME 180 tablet 2  . atorvastatin (LIPITOR) 20 MG tablet Take 1 tablet (20 mg total) by mouth 2 (two) times daily. 180 tablet 3  . Biotin (BIOTIN 5000) 5 MG CAPS Take by mouth.    . Cholecalciferol (VITAMIN D3) 1000 UNITS CAPS Take 2,000 Units by mouth daily.     . Cyanocobalamin (VITAMIN B 12 PO) Take 1 tablet by mouth daily.    [name] diltiazem (CARDIZEM) 30 MG tablet TAKE 1 TABLET BY MOUTH EVERY DAY FOR PALPITATIONS 90 tablet 2  . Ferrous Fumarate-Folic Acid 244-0 MG TABS Take 1 tablet by mouth daily. 30 each 3  . letrozole (FEMARA) 2.5 MG tablet Take 1 tablet (2.5 mg total) by mouth daily. 90 tablet 3  . loratadine (CLARITIN) 10 MG tablet Take 1 tablet (10 mg total) by mouth daily. 30 tablet 11  . LORazepam (ATIVAN) 1 MG tablet TAKE 1  TABLET(1 MG) BY MOUTH EVERY 8 HOURS AS NEEDED FOR ANXIETY 70 tablet 1  . palbociclib (IBRANCE) 125 MG tablet Take 1 tablet (125 mg total) by mouth daily. Take for 21 days on, 7 days off, repeat every 28 days. 21 tablet 0  . pantoprazole (PROTONIX) 40 MG tablet TAKE 1 TABLET(40 MG) BY MOUTH DAILY 90 tablet 1  . permethrin (ELIMITE) 5 % cream Apply from scalp to toes, avoid face. Leave on 8-14 hours. Thoroughly rinse with soap and water. Repeat in 2 weeks if needed. 60 g 1  . rOPINIRole (REQUIP) 0.25 MG tablet Take 1 tablet (0.25 mg total) by mouth at bedtime. 30 tablet 0  . triamcinolone cream (KENALOG) 0.1 % Apply topically 2 (two) times daily. Apply to affected area 45 g 1   No current facility-administered medications for this visit.    PHYSICAL EXAMINATION: ECOG PERFORMANCE STATUS: 1 - Symptomatic but completely ambulatory  Vitals:   07/15/20 1525  BP: 136/77  Pulse: 88  Resp: 18  Temp: (!) 97 F (36.1 C)  SpO2: 96%   Filed Weights   07/15/20 1525  Weight: 143 lb 1.6 oz (64.9 kg)    GENERAL:alert, no distress and comfortable SKIN: skin color, texture, turgor  are normal, no rashes or significant lesions EYES: normal, Conjunctiva are pink and non-injected, sclera clear  NECK: supple, thyroid normal size, non-tender, without nodularity LYMPH:  no palpable lymphadenopathy in the cervical, axillary  LUNGS: clear to auscultation and percussion with normal breathing effort HEART: regular rate & rhythm and no murmurs and no lower extremity edema ABDOMEN:abdomen soft, non-tender and normal bowel sounds Musculoskeletal:no cyanosis of digits and no clubbing  NEURO: alert & oriented x 3 with fluent speech, no focal motor/sensory deficits BREAST: (+) S/p right mastectomy and reconstruction with scar tissue at incision (+) Mild skin erythema of right breast. Left Breast exam benign.        LABORATORY DATA:  I have reviewed the data as listed CBC Latest Ref Rng & Units 07/15/2020 05/25/2020 03/14/2020  WBC 4.0 - 10.5 K/uL 5.2 7.0 6.5  Hemoglobin 12.0 - 15.0 g/dL 12.3 12.4 12.2  Hematocrit 36 - 46 % 38.1 39.0 38.2  Platelets 150 - 400 K/uL 254 251 264     CMP Latest Ref Rng & Units 05/25/2020 12/11/2019 08/06/2019  Glucose 70 - 99 mg/dL 95 98 88  BUN 8 - 23 mg/dL 16 16 19   Creatinine 0.44 - 1.00 mg/dL 0.78 0.74 0.63  Sodium 135 - 145 mmol/L 142 143 140  Potassium 3.5 - 5.1 mmol/L 3.8 3.8 4.5  Chloride 98 - 111 mmol/L 104 105 100  CO2 22 - 32 mmol/L 27 30 31   Calcium 8.9 - 10.3 mg/dL 9.2 9.3 9.9  Total Protein 6.5 - 8.1 g/dL 7.3 7.0 7.0  Total Bilirubin 0.3 - 1.2 mg/dL 0.2(L) 0.4 0.3  Alkaline Phos 38 - 126 U/L 106 94 96  AST 15 - 41 U/L 21 18 19   ALT 0 - 44 U/L 19 21 15       RADIOGRAPHIC STUDIES: I have personally reviewed the radiological images as listed and agreed with the findings in the report. No results found.   ASSESSMENT & PLAN:  CARIANNE TAIRA is a 79 y.o. female with    1. History of recurrent right breast lobular carcinoma, in 2001, 2008 and chest wall recurrence in 2010, local right breast skin recurrence in 05/2020. ER+,  PR-/HER2- -She was initially diagnosed in 01/2000 but unfortunately hadrecurrencetwice and s/p  right mastectomy with implant in place. Her last recurrence involvingthe chest wall in 2010. At that time she was treated with Xeloda and Exemestane 2011-2015. -She unfortunately developed involving the right front chest over the implant in 05/2020. Her PET from 06/23/20 showed no definitive hypermetabolic metastatic disease, but I reviewed scan can have limitation. Her MRI abdomen from 07/09/20 shows 54m pleural-based nodule along the lateral left Hemidiaphragm whilch is still non specific. Will monitor on future CT scan. I have low suspicion this is related to her breast cancer.  -Surgeons, including Dr DMarla Roeand Dr CBrantley Stage feel surgery is unlikely feasible given the large area of skin metastasis and uncertain border of disease. Without complete resection, her cancer is not curable, but still treatable with systemic treatment. Goal of care would be to control disease and prolong life.  -I started her on first-line antiestrogen therapy letrozole on 06/17/20 and CDK4/6 inhibitor Ibrance (palbociclib) 1216m3 weeks on/1 week off. She only took 1 dose Ibrance but stopped due to concerns of side effects although she tolerated first dose well.  -Labs reviewed, CBC and CMP WNL. Iron panel still pending.  -I started her in letrozole and Ibrance.  Patient tried 1 dose of Ibrance and stopped due to the concern of side effects.  I again reviewed possible side effects. I encouraged her to try Ibrance again starting 07/18/20. She is agreeable.  -She had to miss her appointment with Dr DiMarla Roeor surgery consult. I encouraged her to reschedule. She is agreeable.  -I discussed if she was to have significant response to treatment, she can be re-evaluated for surgery. If she is not responding I would recommend chemotherapy as more aggressive therapy to better control disease.  -f/u in 4 weeks    2. Osteopenia  -Her  new baseline DEXA from 07/06/20 shows Lowest T-score -1.9 at AP spine and -1.8 at b/l hips.  -Will monitor on AI.   3. Left lateral Cervical LN with mild enlargement, small and stable, likely benign. Stable.   4. RadiationPulmonaryFibrosis, COPD, Emphysema, Seizures -She continues to f/u with Pulmonologistand Neurologist Dr HiBerdine Addison  PLAN: -Continue Letrozole  -Restart Ibrance on 07/18/20  -Lab in 2 and 4 weeks -F/u in 4 weeks     No problem-specific Assessment & Plan notes found for this encounter.   Orders Placed This Encounter  Procedures  . CMP ([facility])    Standing Status:   Standing    Number of Occurrences:   25    Standing Expiration Date:   07/15/2021   All questions were answered. The patient knows to call the clinic with any problems, questions or concerns. No barriers to learning was detected. The total time spent in the appointment was 30 minutes.     YaTruitt MerleMD 07/15/2020   I, AmJoslyn Devonam acting as scribe for YaTruitt MerleMD.   I have reviewed the above documentation for accuracy and completeness, and I agree with the above.

## 2020-07-13 NOTE — Progress Notes (Signed)
Nutrition Assessment   Reason for Assessment:  Patient vegetarian with questions   ASSESSMENT:  79 year old female with recurrent right breast cancer.  Past medical history of CAD, COPD, esoinophilic  Esophagitis, IBS, etoh use in remission, depression. Started letrozole and ibrance.  Met with patient and husband in clinic this pm.  Patient reports that husband wanted meeting.  Says that they want ideas on eating more vegetarian meals.  Patient reports that she typically eats cereal [employer] high protein) with fruit and cow's milk for breakfast.  Lunch is peanuts or protein bar.  Supper is fried squash/zucchini, rice, backed beans, sometimes salmon patties.  One night she will eat shrimp from local restaurant with hushpuppies and fries.  One night she will have 2 slices of personal pan pizza with cheese and broccoli with cheese and eat the other 2 slices another night.  Feels that oral nutrition supplements make her gain weight.     Medications: MVI, Vit B 12, Fe, folic acid, vit D   Labs: reviewed   Anthropometrics:   Height: 65 inches Weight: 141 lb 12.8 oz on 8/24 Weight has been stable 10-142 since May 2021.   BMI: 23   Estimated Energy Needs  Kcals: 1600-1900 Protein: 80-95 g Fluid: 1.6 L   NUTRITION DIAGNOSIS: Food and nutrition related knowledge deficit related to interested in eating more plant foods and getting proper nutrition as evidenced by requesting consult   INTERVENTION:  Discussed currently sources of protein that patient is eating in diet. Discussed foods that contain protein (non meat) to include in diet for additional protein.  Encouraged patient to include variety of colorful plant foods  Discussed vegan options for oral nutrition supplements (orgain vegan shake) Patient concerned with weight gain.  Discussed importance that during treatment goal is weight maintenance  Encouraged patient and husband to search internet for new plant based recipes to try.   We discussed brands of vegan foods items to try as well (veggie burgers) Contact information provided   Next Visit: no follow-up Patient to call with questions  Shannel Zahm B. Zenia Resides, [place], [place] Registered Dietitian [phone] (mobile)

## 2020-07-14 ENCOUNTER — Ambulatory Visit (INDEPENDENT_AMBULATORY_CARE_PROVIDER_SITE_OTHER): Payer: PPO | Admitting: Pulmonary Disease

## 2020-07-14 ENCOUNTER — Encounter: Payer: Self-pay | Admitting: Pulmonary Disease

## 2020-07-14 ENCOUNTER — Ambulatory Visit: Payer: PPO | Admitting: Pulmonary Disease

## 2020-07-14 ENCOUNTER — Other Ambulatory Visit: Payer: Self-pay | Admitting: Family Medicine

## 2020-07-14 VITALS — BP 122/72 | HR 94 | Ht 65.75 in | Wt 142.0 lb

## 2020-07-14 DIAGNOSIS — J439 Emphysema, unspecified: Secondary | ICD-10-CM

## 2020-07-14 DIAGNOSIS — R0602 Shortness of breath: Secondary | ICD-10-CM

## 2020-07-14 DIAGNOSIS — S299XXA Unspecified injury of thorax, initial encounter: Secondary | ICD-10-CM

## 2020-07-14 LAB — PULMONARY FUNCTION TEST
DL/VA % pred: 97 %
DL/VA: 3.94 ml/min/mmHg/L
DLCO cor % pred: 65 %
DLCO cor: 13.27 ml/min/mmHg
DLCO unc % pred: 65 %
DLCO unc: 13.27 ml/min/mmHg
FEF 25-75 Post: 2.08 L/sec
FEF 25-75 Pre: 1.43 L/sec
FEF2575-%Change-Post: 45 %
FEF2575-%Pred-Post: 125 %
FEF2575-%Pred-Pre: 85 %
FEV1-%Change-Post: 2 %
FEV1-%Pred-Post: 88 %
FEV1-%Pred-Pre: 85 %
FEV1-Post: 1.95 L
FEV1-Pre: 1.9 L
FEV1FVC-%Change-Post: 3 %
FEV1FVC-%Pred-Pre: 106 %
FEV6-%Change-Post: 0 %
FEV6-%Pred-Post: 84 %
FEV6-%Pred-Pre: 85 %
FEV6-Post: 2.37 L
FEV6-Pre: 2.39 L
FEV6FVC-%Pred-Post: 105 %
FEV6FVC-%Pred-Pre: 105 %
FVC-%Change-Post: 0 %
FVC-%Pred-Post: 80 %
FVC-%Pred-Pre: 80 %
FVC-Post: 2.37 L
FVC-Pre: 2.39 L
Post FEV1/FVC ratio: 82 %
Post FEV6/FVC ratio: 100 %
Pre FEV1/FVC ratio: 79 %
Pre FEV6/FVC Ratio: 100 %
RV % pred: 90 %
RV: 2.18 L
TLC % pred: 84 %
TLC: 4.5 L

## 2020-07-14 NOTE — Progress Notes (Signed)
Full PFT performed today. °

## 2020-07-14 NOTE — Progress Notes (Deleted)
Anne [name]    [id number]    Sep 25, 1941  Primary Care Physician:[name], [name], MD  Referring Physician: [name], MD [address] [place],  [place] [address]  Chief complaint: Follow-up for emphysema, COPD   HPI: 79 year old with history of breast cancer, emphysema, eosinophilic esophagitis, palpitations, hyperlipidemia Complains of progressive dyspnea on exertion for the past 2 years.  She does not have symptoms at rest.  Denies any cough, sputum production, wheezing.  She is on albuterol which she is using a couple of times per week.  She has significant seasonal allergies, notes episodes of increasing dyspnea, tightness after working in her yard.  She also has acid reflux, eosinophilic esophagitis and follows with Dr. [name].  She is on budesonide, PPI therapy for this. Started on Symbicort at last visit in June 2018.  She stopped using it after few weeks as it made her hoarse.   She was then prescribed Trelegy inhaler but stopped it after a few days as it was not making a difference.  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 Smoking history: 50-70-pack-year smoking history.  Quit in 2008. Travel history: No significant travel.  Interim history: Currently off all inhalers.  She has remained at home and largely sedentary during the COVID-19 pandemic with increase in weight. Reports mild increase in dyspnea on exertion.  No cough or sputum production no fevers or chills or wheezing.  Outpatient Encounter Medications as of 07/14/2020  Medication Sig  . acetaminophen (TYLENOL) 500 MG tablet Take 500-1,000 mg by mouth every 6 (six) hours as needed (for pain.).  [name] amitriptyline (ELAVIL) 25 MG tablet TAKE 6  TABLETS(150 MG) BY MOUTH AT BEDTIME  . atorvastatin (LIPITOR) 20 MG tablet Take 1 tablet (20 mg total) by mouth 2 (two) times daily.  . Biotin (BIOTIN 5000) 5 MG CAPS Take by mouth.  . Cholecalciferol (VITAMIN D3) 1000 UNITS CAPS Take 2,000 Units by mouth daily.   . Cyanocobalamin (VITAMIN B 12 PO) Take 1 tablet by mouth daily.  [name] diltiazem (CARDIZEM) 30 MG tablet TAKE 1 TABLET BY MOUTH EVERY DAY FOR PALPITATIONS  . Ferrous Fumarate-Folic Acid 130-8 MG TABS Take 1 tablet by mouth daily.  [name] letrozole (FEMARA) 2.5 MG tablet Take 1 tablet (2.5 mg total) by mouth daily.  [name] loratadine (CLARITIN) 10 MG tablet Take 1 tablet (10 mg total) by mouth daily.  [name] LORazepam (ATIVAN) 1 MG tablet TAKE 1 TABLET(1 MG) BY MOUTH EVERY 8 HOURS AS NEEDED FOR ANXIETY  . palbociclib (IBRANCE) 125 MG tablet Take 1 tablet (125 mg total) by mouth daily. Take for 21 days on, 7 days off, repeat every 28 days.  . pantoprazole (PROTONIX) 40 MG tablet TAKE 1 TABLET(40 MG) BY MOUTH DAILY  . permethrin (ELIMITE) 5 % cream Apply from scalp to toes, avoid face. Leave on 8-14 hours. Thoroughly rinse with soap and water. Repeat in 2 weeks if needed.  [name] rOPINIRole (REQUIP) 0.25 MG tablet Take 1 tablet (0.25 mg total) by mouth at bedtime.  . triamcinolone cream (KENALOG) 0.1 % Apply topically 2 (two) times daily. Apply to affected area   No facility-administered encounter medications on file as of 07/14/2020.   Physical Exam: Blood pressure 116/60, pulse  94, temperature 98.4 F (36.9 C), temperature source Oral, height 5' 5.5" (1.664 m), weight 142 lb 9.6 oz (64.7 kg), SpO2 95 %. Gen:      No acute distress HEENT:  EOMI, sclera anicteric Neck:     No masses; no thyromegaly Lungs:    Clear to auscultation bilaterally; normal respiratory effort CV:         Regular rate and rhythm; no murmurs Abd:      + bowel sounds; soft, non-tender; no palpable masses, no distension Ext:    No edema; adequate peripheral perfusion Skin:      Warm  and dry; no rash Neuro: alert and oriented x 3 Psych: normal mood and affect  Data Reviewed: Imaging CT chest 07/03/2013- postradiation changes in the right upper lobe, centrilobular emphysema, nodular thickening of the pleura at the bases CT abdomen 08/10/17- visualized lung bases show minimal atelectasis, no interstitial lung disease. CT high-resolution 08/12/2018-moderate emphysema, mild radiation fibrosis in the apical right upper lobe, two-vessel coronary atherosclerosis. I reviewed the images personally.  Labs CBC 04/16/2018-WBC 8.6, eos 1.8%, absolute eosinophil count 155 Allergy profile 05/06/1989-IgE 271, sensitive to dust mite.  Mild allergies to cat, dog, tree pollen  PFTs Spirometry  09/13/2009 FVC 1.65 (49%], FEV1 1.39 (55%], F/F 84  08/11/2018 FVC 2.3 [77%), FEV1 2.3 [100%), F/F 97 Normal test.  PFTs 09/09/2018 FVC 2.62 [1%), FEV1 2.07 [92%), F/F 79, TLC 56%, DLCO 48%, DLCO/VA 76%  FENO 08/11/2018-28  Assessment:  Emphysema, dyspnea Lung imaging reviewed with minimal scarring in the right upper lobe consistent with postradiation changes and moderate emphysema.  No evidence of interstitial lung disease. There are minimal obstructive changes on pulmonary function tests  In retrospect her symptoms of dyspnea were likely secondary to iron deficiency anemia as inhalers have not made any difference with her breathing.  Now with increase in dyspnea on exertion after weight gain during the COVID-19 pandemic. Suspect there is a component of deconditioning contributing to her symptoms. Encouraged her to start an exercise program at home and stay active. Get chest x-ray and PFTs for reevaluation.  Plan/Recommendations: - Observe off inhalers. - Chest x-ray, PFTs - Start exercise therapy  Marshell Garfinkel MD [facility] 07/14/2020, 3:39 PM  CC: [name], MD

## 2020-07-14 NOTE — Progress Notes (Signed)
Anne [name]    [id number]    1941/01/03  Primary Care Physician:[name], [name], MD  Referring Physician: [name], MD [address] [place],  [place] [address]  Chief complaint: Follow-up for emphysema, COPD   HPI: 79 year old with history of breast cancer, emphysema, eosinophilic esophagitis, palpitations, hyperlipidemia Complains of progressive dyspnea on exertion for the past 2 years.  She does not have symptoms at rest.  Denies any cough, sputum production, wheezing.  She is on albuterol which she is using a couple of times per week.  She has significant seasonal allergies, notes episodes of increasing dyspnea, tightness after working in her yard.  She also has acid reflux, eosinophilic esophagitis and follows with Dr. [name].  She is on budesonide, PPI therapy for this. Started on Symbicort at last visit in June 2018.  She stopped using it after few weeks as it made her hoarse.   She was then prescribed Trelegy inhaler but stopped it after a few days as it was not making a difference.  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 Smoking history: 50-70-pack-year smoking history.  Quit in 2008. Travel history: No significant travel.  Interim history: Currently off all inhalers.  Breathing is doing ok with mild DOE.   Outpatient Encounter Medications as of 07/14/2020  Medication Sig  . acetaminophen (TYLENOL) 500 MG tablet Take 500-1,000 mg by mouth every 6 (six) hours as needed (for pain.).  [name] amitriptyline (ELAVIL) 25 MG tablet TAKE 6 TABLETS(150 MG) BY MOUTH AT BEDTIME  . atorvastatin (LIPITOR) 20 MG tablet Take 1 tablet (20 mg total) by mouth 2 (two) times daily.  . Biotin (BIOTIN 5000) 5 MG CAPS Take by  mouth.  . Cholecalciferol (VITAMIN D3) 1000 UNITS CAPS Take 2,000 Units by mouth daily.   . Cyanocobalamin (VITAMIN B 12 PO) Take 1 tablet by mouth daily.  [name] diltiazem (CARDIZEM) 30 MG tablet TAKE 1 TABLET BY MOUTH EVERY DAY FOR PALPITATIONS  . Ferrous Fumarate-Folic Acid 250-5 MG TABS Take 1 tablet by mouth daily.  [name] letrozole (FEMARA) 2.5 MG tablet Take 1 tablet (2.5 mg total) by mouth daily.  [name] loratadine (CLARITIN) 10 MG tablet Take 1 tablet (10 mg total) by mouth daily.  [name] LORazepam (ATIVAN) 1 MG tablet TAKE 1 TABLET(1 MG) BY MOUTH EVERY 8 HOURS AS NEEDED FOR ANXIETY  . palbociclib (IBRANCE) 125 MG tablet Take 1 tablet (125 mg total) by mouth daily. Take for 21 days on, 7 days off, repeat every 28 days.  . pantoprazole (PROTONIX) 40 MG tablet TAKE 1 TABLET(40 MG) BY MOUTH DAILY  . permethrin (ELIMITE) 5 % cream Apply from scalp to toes, avoid face. Leave on 8-14 hours. Thoroughly rinse with soap and water. Repeat in 2 weeks if needed.  [name] rOPINIRole (REQUIP) 0.25 MG tablet Take 1 tablet (0.25 mg total) by mouth at bedtime.  . triamcinolone cream (KENALOG) 0.1 % Apply topically 2 (two) times daily. Apply to affected area   No facility-administered encounter medications on file as of 07/14/2020.   Physical Exam: Blood pressure 122/72, pulse 94, height 5' 5.75" (1.67 m), weight 142 lb (64.4 kg), SpO2 97 %. Gen:      No acute distress HEENT:  EOMI, sclera  anicteric Neck:     No masses; no thyromegaly Lungs:    Clear to auscultation bilaterally; normal respiratory effort CV:         Regular rate and rhythm; no murmurs Abd:      + bowel sounds; soft, non-tender; no palpable masses, no distension Ext:    No edema; adequate peripheral perfusion Skin:      Warm and dry; no rash Neuro: alert and oriented x 3 Psych: normal mood and affect  Data Reviewed: Imaging CT chest 07/03/2013- postradiation changes in the right upper lobe, centrilobular emphysema, nodular thickening of the pleura at the  bases CT abdomen 08/10/17- visualized lung bases show minimal atelectasis, no interstitial lung disease. CT high-resolution 08/12/2018-moderate emphysema, mild radiation fibrosis in the apical right upper lobe, two-vessel coronary atherosclerosis. Chest x-ray 05/25/2020-hyperinflation, emphysema. I reviewed the images personally.   Labs CBC 04/16/2018-WBC 8.6, eos 1.8%, absolute eosinophil count 155 Allergy profile 05/06/1989-IgE 271, sensitive to dust mite.  Mild allergies to cat, dog, tree pollen  PFTs Spirometry  09/13/2009 FVC 1.65 (49%], FEV1 1.39 (55%], F/F 84  08/11/2018 FVC 2.3 [77%), FEV1 2.3 [100%), F/F 97  09/09/2018 FVC 2.62 [1%), FEV1 2.07 [92%), F/F 79, TLC 56%, DLCO 48%, DLCO/VA 76%  05/13/2020 FVC 2.39 [80%], FEV1 1.90 [85%], F/F 82, TLC 4.50 [84%], DLCO 13.27 [65%] Minimal obstruction with diffusion impairment  FENO 08/11/2018-28  Assessment:  Emphysema, dyspnea Lung imaging reviewed with minimal scarring in the right upper lobe consistent with postradiation changes and moderate emphysema.  No evidence of interstitial lung disease.  There is no overt obstruction she has minimal small airway disease as shown by curvature to the flow loop on PFTs.    Suspect there is a component of deconditioning contributing to her symptoms. Encouraged her to continue an exercise program at home and stay active. Observe off inhalers as she has not responded to them in the past.   Plan/Recommendations: - Observe off inhalers. - Exercise therapy  Marshell Garfinkel MD [facility] 07/14/2020, 3:34 PM  CC: [name], MD

## 2020-07-14 NOTE — Patient Instructions (Signed)
I had a function test which shows mild changes consistent with emphysema Since her doing well we will continue to monitor  Continue to exercise and eat healthy Follow-up in 6 months.

## 2020-07-15 ENCOUNTER — Inpatient Hospital Stay: Payer: PPO

## 2020-07-15 ENCOUNTER — Encounter: Payer: Self-pay | Admitting: Pulmonary Disease

## 2020-07-15 ENCOUNTER — Other Ambulatory Visit: Payer: Self-pay

## 2020-07-15 ENCOUNTER — Inpatient Hospital Stay: Payer: PPO | Admitting: Hematology

## 2020-07-15 VITALS — BP 136/77 | HR 88 | Temp 97.0°F | Resp 18 | Ht 65.75 in | Wt 143.1 lb

## 2020-07-15 DIAGNOSIS — Z79899 Other long term (current) drug therapy: Secondary | ICD-10-CM | POA: Diagnosis not present

## 2020-07-15 DIAGNOSIS — I700 Atherosclerosis of aorta: Secondary | ICD-10-CM | POA: Diagnosis not present

## 2020-07-15 DIAGNOSIS — J439 Emphysema, unspecified: Secondary | ICD-10-CM | POA: Diagnosis not present

## 2020-07-15 DIAGNOSIS — C792 Secondary malignant neoplasm of skin: Secondary | ICD-10-CM

## 2020-07-15 DIAGNOSIS — R6884 Jaw pain: Secondary | ICD-10-CM | POA: Diagnosis not present

## 2020-07-15 DIAGNOSIS — N644 Mastodynia: Secondary | ICD-10-CM | POA: Diagnosis not present

## 2020-07-15 DIAGNOSIS — Z803 Family history of malignant neoplasm of breast: Secondary | ICD-10-CM | POA: Diagnosis not present

## 2020-07-15 DIAGNOSIS — C50911 Malignant neoplasm of unspecified site of right female breast: Secondary | ICD-10-CM | POA: Diagnosis not present

## 2020-07-15 DIAGNOSIS — Z79811 Long term (current) use of aromatase inhibitors: Secondary | ICD-10-CM | POA: Diagnosis not present

## 2020-07-15 DIAGNOSIS — Z9882 Breast implant status: Secondary | ICD-10-CM | POA: Diagnosis not present

## 2020-07-15 DIAGNOSIS — Z885 Allergy status to narcotic agent status: Secondary | ICD-10-CM | POA: Diagnosis not present

## 2020-07-15 DIAGNOSIS — M8589 Other specified disorders of bone density and structure, multiple sites: Secondary | ICD-10-CM | POA: Diagnosis not present

## 2020-07-15 DIAGNOSIS — Z923 Personal history of irradiation: Secondary | ICD-10-CM | POA: Diagnosis not present

## 2020-07-15 DIAGNOSIS — R21 Rash and other nonspecific skin eruption: Secondary | ICD-10-CM | POA: Diagnosis not present

## 2020-07-15 DIAGNOSIS — R682 Dry mouth, unspecified: Secondary | ICD-10-CM | POA: Diagnosis not present

## 2020-07-15 DIAGNOSIS — Z9221 Personal history of antineoplastic chemotherapy: Secondary | ICD-10-CM | POA: Diagnosis not present

## 2020-07-15 DIAGNOSIS — R569 Unspecified convulsions: Secondary | ICD-10-CM | POA: Diagnosis not present

## 2020-07-15 DIAGNOSIS — K209 Esophagitis, unspecified without bleeding: Secondary | ICD-10-CM | POA: Diagnosis not present

## 2020-07-15 DIAGNOSIS — R590 Localized enlarged lymph nodes: Secondary | ICD-10-CM | POA: Diagnosis not present

## 2020-07-15 DIAGNOSIS — Z170 Estrogen receptor positive status [ER+]: Secondary | ICD-10-CM | POA: Diagnosis not present

## 2020-07-15 DIAGNOSIS — Z9049 Acquired absence of other specified parts of digestive tract: Secondary | ICD-10-CM | POA: Diagnosis not present

## 2020-07-15 DIAGNOSIS — Z800 Family history of malignant neoplasm of digestive organs: Secondary | ICD-10-CM | POA: Diagnosis not present

## 2020-07-15 DIAGNOSIS — Z886 Allergy status to analgesic agent status: Secondary | ICD-10-CM | POA: Diagnosis not present

## 2020-07-15 DIAGNOSIS — Z888 Allergy status to other drugs, medicaments and biological substances status: Secondary | ICD-10-CM | POA: Diagnosis not present

## 2020-07-15 DIAGNOSIS — D649 Anemia, unspecified: Secondary | ICD-10-CM

## 2020-07-15 LAB — CBC WITH DIFFERENTIAL (CANCER CENTER ONLY)
Abs Immature Granulocytes: 0.02 10*3/uL (ref 0.00–0.07)
Basophils Absolute: 0 10*3/uL (ref 0.0–0.1)
Basophils Relative: 1 %
Eosinophils Absolute: 0.1 10*3/uL (ref 0.0–0.5)
Eosinophils Relative: 2 %
HCT: 38.1 % (ref 36.0–46.0)
Hemoglobin: 12.3 g/dL (ref 12.0–15.0)
Immature Granulocytes: 0 %
Lymphocytes Relative: 33 %
Lymphs Abs: 1.7 10*3/uL (ref 0.7–4.0)
MCH: 30.8 pg (ref 26.0–34.0)
MCHC: 32.3 g/dL (ref 30.0–36.0)
MCV: 95.3 fL (ref 80.0–100.0)
Monocytes Absolute: 0.4 10*3/uL (ref 0.1–1.0)
Monocytes Relative: 7 %
Neutro Abs: 3 10*3/uL (ref 1.7–7.7)
Neutrophils Relative %: 57 %
Platelet Count: 254 10*3/uL (ref 150–400)
RBC: 4 MIL/uL (ref 3.87–5.11)
RDW: 13.2 % (ref 11.5–15.5)
WBC Count: 5.2 10*3/uL (ref 4.0–10.5)
nRBC: 0 % (ref 0.0–0.2)

## 2020-07-15 LAB — CMP (CANCER CENTER ONLY)
ALT: 15 U/L (ref 0–44)
AST: 18 U/L (ref 15–41)
Albumin: 3.7 g/dL (ref 3.5–5.0)
Alkaline Phosphatase: 111 U/L (ref 38–126)
Anion gap: 8 (ref 5–15)
BUN: 13 mg/dL (ref 8–23)
CO2: 29 mmol/L (ref 22–32)
Calcium: 9.1 mg/dL (ref 8.9–10.3)
Chloride: 104 mmol/L (ref 98–111)
Creatinine: 0.71 mg/dL (ref 0.44–1.00)
GFR, Est AFR Am: >60 mL/min (ref >60)
GFR, Estimated: >60 mL/min (ref >60)
Glucose, Bld: 94 mg/dL (ref 70–99)
Potassium: 3.9 mmol/L (ref 3.5–5.1)
Sodium: 141 mmol/L (ref 135–145)
Total Bilirubin: 0.4 mg/dL (ref 0.3–1.2)
Total Protein: 6.7 g/dL (ref 6.5–8.1)

## 2020-07-15 NOTE — Telephone Encounter (Signed)
Dr. Mannam, please see pt's mychart message and advise. °

## 2020-07-16 ENCOUNTER — Encounter: Payer: Self-pay | Admitting: Hematology

## 2020-07-19 ENCOUNTER — Encounter: Payer: Self-pay | Admitting: Hematology

## 2020-07-19 ENCOUNTER — Telehealth: Payer: Self-pay | Admitting: Hematology

## 2020-07-19 ENCOUNTER — Telehealth: Payer: Self-pay | Admitting: Pharmacist

## 2020-07-19 LAB — IRON AND TIBC
Iron: 71 ug/dL (ref 41–142)
Saturation Ratios: 24 % (ref 21–57)
TIBC: 293 ug/dL (ref 236–444)
UIBC: 222 ug/dL (ref 120–384)

## 2020-07-19 LAB — FERRITIN: Ferritin: 67 ng/mL (ref 11–307)

## 2020-07-19 NOTE — Telephone Encounter (Signed)
Oral Oncology Pharmacist Encounter   Received call from patient's husband, Daisi Kentner, to review Ibrance suspension preparation directions. Discussed with Mr. Feliz recommendations per [employer] regarding dissolving the Ibrance tablet in 15 mL of boiled water, stirring continuously for 2 minutes, followed by adding an additional 15 mL of boiled water and repeating the stirring process until the tablet is completely disintegrated. Once disintegrated, instructed to add 15 mL of room temperature water into the glass, swirl the contents of the cup, and for Ms. Coreas to drink all contents in the cup.   Instructions also placed in mail for Mr. and Ms. Graber.  Leron Croak, PharmD, BCPS Hematology/Oncology Clinical Pharmacist [facility] [phone] 07/19/2020 3:04 PM

## 2020-07-19 NOTE — Telephone Encounter (Signed)
Scheduled appointments per 9/7 los. Patient is aware of all appointments dates and times.

## 2020-07-20 ENCOUNTER — Encounter: Payer: Self-pay | Admitting: Hematology

## 2020-07-20 ENCOUNTER — Other Ambulatory Visit: Payer: Self-pay | Admitting: Hematology

## 2020-07-25 ENCOUNTER — Telehealth: Payer: Self-pay

## 2020-07-25 ENCOUNTER — Telehealth: Payer: Self-pay | Admitting: Hematology

## 2020-07-25 NOTE — Telephone Encounter (Signed)
(  continued from last phone note). I reassured her that this is not a concerning sign for new metastasis or other etiology. However she was upset and angry and asked "how do you know without a scan?". I explained to her that she had breast MRI in 10/30/2019 and PET scan on 06/23/2020. I do not think she needs additional scan at this point. Then she asked"How do you know even without exam me?" , despite the fact that I jsut aw and exam her 10 days ago.  I offered her an lab and OV this Friday to exam her and discuss further. She agreed.   Truitt Merle  07/25/2020

## 2020-07-25 NOTE — Telephone Encounter (Signed)
I called back and spoke with pt and her husband. I think her right breast pain is probably related to her previously surgery, and possible skin metastasis.

## 2020-07-25 NOTE — Telephone Encounter (Signed)
Mr. Kanan called stating his wife has pain in her right breast and arm.  She is requesting a mammogram of the right breast.

## 2020-07-26 ENCOUNTER — Encounter: Payer: Self-pay | Admitting: Hematology

## 2020-07-27 ENCOUNTER — Encounter: Payer: Self-pay | Admitting: Hematology

## 2020-07-27 NOTE — Progress Notes (Signed)
[place]   [phone]) [phone] [phone]   Clinic Follow up Note   Patient Care Team: Anne Lukes, MD as PCP - General (Family Medicine) Anne Records, MD as PCP - Cardiology (Cardiology) Anne Cancel, MD as Consulting Physician (Orthopedic Surgery) Anne Mayer, MD as Consulting Physician (Gastroenterology) Anne Jacks, MD as Consulting Physician (Ophthalmology) Anne [name], DDS (Oral Surgery) Anne [name] (Dentistry) Anne [name], Anne Bravo, MD as Referring Physician (Dermatology) Anne [name], Anne Lofty, DO as Attending Physician (Plastic Surgery) Anne Merle, MD as Consulting Physician (Hematology)  Date of Service:  07/29/2020  CHIEF COMPLAINT: F/u of recurrent right breast cancer and right breast pain   SUMMARY OF ONCOLOGIC HISTORY: Oncology History Overview Note  Cancer Staging No matching staging information was found for the patient.    Breast cancer, right breast ([place])  01/2000 Initial Diagnosis   Breast cancer, right breast ([place])  History of primary lobular right breast carcinoma. 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 I believe that she had local radiation, tho that information is not available in this EMR.   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 2008 (tho patient did not agree to surgery until July 2008), invasive lobular carcinoma ER 81%, PR 96%, Her 2 1+ (PM08-99) , 1.5 cm invasive lobular with 3 axillary nodes negative, LVSI and perineural invasion present ([id number]). She subsequently declined adjuvant systemic treatment . She  had a complicated course following right mastectomy, with difficult healing after right breast reconstruction.    07/2009 Relapse/Recurrence   She had skin recurrence lateral right chest wall excised in Sept 2010 then treated with xeloda from Oct 2010 thru Feb 2011 and began on Arimidex July 2011; she may have stopped Arimidex after bone density scan 02-2014, or possibly the year prior (?).    2015 Imaging   Bone density scan [facility] 02-15-14 still osteopenic range, slightly lower in LS compared with 2013 and stable in hips.   05/19/2020 Mammogram   IMPRESSION: No mammographic evidence of malignancy. A result letter of this screening mammogram will be mailed directly to the patient.     06/03/2020 Relapse/Recurrence   skin punch biopsy of the right upper anterior chest wall which shows infiltrating carcinoma, the tumor cells are strongly positive for cytokeratin 7 and GATA-3 staining and are negative for cytokeratin 20, the combined histologic and IHC findings support a diagnosis of breast carcinoma, ER 100% positive with moderate-weak staining intensity, progesterone 0% negative, HER-2 equivocal by IHC and negative by FISH.   Seen in [employer]   06/17/2020 -  Anti-estrogen oral therapy   First-line Letrozole 2.5mg  once daily starting 06/17/20 and Ibrance    06/23/2020 PET scan   IMPRESSION: 1. No definite hypermetabolic metastatic disease. No hypermetabolic chest wall lesions. 2. Indeterminate small vague focus of mild hypermetabolism (max SUV 4.7) projecting over lateral left hemidiaphragm/superolateral spleen, without a discrete correlate on the noncontrast CT images. Suggest further evaluation with MRI abdomen without and with IV contrast. 3. Aortic Atherosclerosis (ICD10-I70.0) and Emphysema (ICD10-J43.9).   06/30/2020 -  Antibody Plan   First-line AI and Ibrance 125mg  for 3 weeks on/1 week off  starting 06/30/20, but only took 1 dose Ibrance due to side effect concerns.            -She  restarted on 07/18/20      CURRENT THERAPY:  First-line Letrozole 2.31m once daily starting 06/17/20 and Ibrance 1221mfor 3 weeks on/1 week off starting 06/30/20, but only took 1 dose Ibrance due to side effect concerns. She restarted on 07/18/20.   INTERVAL HISTORY:  Anne [name] here for a follow up. She presents to the clinic with her daughter and husband. She has been having pain in her right axilla starting about mid afternoon. This is not triggered by another as she can sit still and pain will start. Her bra strap does exacerbate this pain, so she does not use it. Once her pain starts, it is constant until the next day. She notes she is right handed but this has not impacted her function, however her right arm ROM is limited. She has been taking 50084mylenol 6 times a day.  She notes she starts having this pain before taking her 3:30 Ibrance dose. She restarted on 07/18/20. She has 1 resolving mouth sores. She initially heaviness of her feet, but resolved with walking.  She notes jaw pain with opening her mouth at a certain point. She will have to chew very slow. She uses biotene for dry mouth. She denies any recent depression. She has a few skin bumps on her feet and leg. This no longer itches, but she did have a tick bite of left inner arm.  She notes her concern about her cancer and would like to be seen every 2 weeks for now. She also notes concern with Lipitor and her Ibrance.    REVIEW OF SYSTEMS:   Constitutional: Denies fevers, chills or abnormal weight loss Eyes: Denies blurriness of vision Ears, nose, mouth, throat, and face: Denies mucositis or sore throat (+) Jaw soreness  Respiratory: Denies cough, dyspnea or wheezes Cardiovascular: Denies palpitation, chest discomfort or lower extremity swelling Gastrointestinal:  Denies nausea, heartburn or change in bowel habits Skin: Denies abnormal skin rashes Lymphatics: Denies new lymphadenopathy or easy bruising Neurological:Denies  numbness, tingling or new weaknesses Behavioral/Psych: Mood is stable, no new changes  Breast (+) intermittent Right axillary pain (5/10) with limited ROM  All [name] systems were reviewed with the patient and are negative.  MEDICAL HISTORY:  Past Medical History:  Diagnosis Date  . ALKALINE PHOSPHATASE, ELEVATED 03/15/2009  . Allergic state 06/10/2012  . Allergy   . Anemia   . Anxiety and depression 04/28/2011  . Arthritis   . Asthma   . Atypical chest pain 11/30/2011  . 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 Baker's cyst of knee 05/22/2011  . Cancer ([facility],  08   XRT/chemo 01-02/ lobular invasive ca  . Carotid artery disease ([place]  a. Carotid duplex 03/2014: stable 1-39% BICA, f/u due 03/2016.  . CMarland Kitchenronic alcoholism in remission ([place]CC[place]/17/2012   Did not tolerate Klonopin, caused some confusion and bad dreams.    . Clotting disorder ([place] . COPD (chronic obstructive pulmonary disease) ([place] . Dermatitis 11/23/2012  . EE (eosinophilic esophagitis)   . Emphysema of lung ([place] . Esophageal ring   . ESOPHAGEAL STRICTURE 03/29/2009  . Fall 11/23/2012  . Family history of breast cancer   . Family history of colon cancer   . Family history of ovarian cancer   . Family  history of pancreatic cancer   . Folliculitis of nose 3/41/9379  . GERD (gastroesophageal reflux disease) 09/29/2009   improved s/p cholecystectomy and esophagus dilatation  . History of chicken pox   . History of measles   . History of shingles    2 episodes  . Hx of echocardiogram    a. Echo 01/2013: mild LVH, EF 55-60%, normal wall motion, Gr 1 diast dysfn  . Hyperlipidemia   . Knee pain, bilateral 07/23/2011  . Medicare annual wellness visit, subsequent 06/19/2015  . Mixed hyperlipidemia 10/17/2010   Qualifier: Diagnosis of  By: Mack Guise    . Orthostasis   . Osteopenia 03/14/2011  . Osteoporosis   . Personal history of chemotherapy 2001  . Personal history of radiation therapy 2001     rt breast  . 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 PVC's (premature ventricular contractions)    a. Event monitor 01/2013: NSR, extensive PVCs.  . Radial neck fracture 10/2011   minimally displaced  . Substance abuse ([place])    In remission 3 years.   . Urinary incontinence 03/19/2012  . Vaginitis 05/22/2011    SURGICAL HISTORY: Past Surgical History:  Procedure Laterality Date  . APPENDECTOMY    . AUGMENTATION MAMMAPLASTY Right 05/27/2007  . BREAST BIOPSY Right 01/02/2007   wire loc  . BREAST BIOPSY  12/26/2006  . BREAST LUMPECTOMY Right 2001  . BREAST RECONSTRUCTION  2008, 2009, 2010  . BREAST REDUCTION WITH MASTOPEXY Left 05/30/2017   Procedure: LEFT BREAST REDUCTION FOR SYMTERY WITH MASTOPEXY;  Surgeon: Wallace Going, DO;  Location: [place];  Service: Plastics;  Laterality: Left;  . CHOLECYSTECTOMY  2010  . COLONOSCOPY  09/05/10   cecal avm's  . DENTAL SURGERY  05/2016   4 dental implants by Dr. Loyal Gambler.  [name] ERCP  2010    CBD stone extraction   . ESOPHAGOGASTRODUODENOSCOPY  01/08/2012   Procedure: ESOPHAGOGASTRODUODENOSCOPY (EGD);  Surgeon: Anne Mayer, MD;  Location[facility];  Service: Endoscopy;  Laterality: N/A;  . ESOPHAGOGASTRODUODENOSCOPY (EGD) WITH ESOPHAGEAL DILATION  2010, 2012  . LAPAROSCOPIC APPENDECTOMY N/A 08/04/2016   Procedure: APPENDECTOMY LAPAROSCOPIC;  Surgeon: Michael Boston, MD;  Location: [facility];  Service: General;  Laterality: N/A;  . LIPOSUCTION WITH LIPOFILLING Left 11/21/2017   Procedure: LIPOSUCTION FROM ABDOMEN WITH LIPOFILLING TO LEFT BREAST;  Surgeon: Wallace Going, DO;  Location: [place];  Service: Plastics;  Laterality: Left;  [name] MASTECTOMY MODIFIED RADICAL Right 05/27/2007   , Mastectomy modified radical (08), breast reconstruction, CA lesions excised lateral abd wall 2010  . MASTOPEXY Left 11/21/2017   Procedure: LEFT BREAST REVISION MASTOPEXY FOR SYMMETRY;  Surgeon: Wallace Going, DO;   Location: [place];  Service: Plastics;  Laterality: Left;  . REDUCTION MAMMAPLASTY Left   . SAVORY DILATION  01/08/2012   Procedure: SAVORY DILATION;  Surgeon: Anne Mayer, MD;  Location: [facility];  Service: Endoscopy;  Laterality: N/A;  need xray    I have reviewed the social history and family history with the patient and they are unchanged from previous note.  ALLERGIES:  is allergic to codeine, morphine, peanut-containing drug products, sorbitol, tomato, zofran, advil [ibuprofen], diphenhydramine hcl, and oxycodone.  MEDICATIONS:  Current Outpatient Medications  Medication Sig Dispense Refill  . acetaminophen (TYLENOL) 500 MG tablet Take 500-1,000 mg by mouth every 6 (six) hours as needed (for pain.).    [name] amitriptyline (ELAVIL) 25 MG tablet TAKE 6 TABLETS(150 MG) BY MOUTH  AT BEDTIME 180 tablet 2  . atorvastatin (LIPITOR) 20 MG tablet Take 1 tablet (20 mg total) by mouth 2 (two) times daily. 180 tablet 3  . Biotin (BIOTIN 5000) 5 MG CAPS Take by mouth.    . Cholecalciferol (VITAMIN D3) 1000 UNITS CAPS Take 2,000 Units by mouth daily.     . Cyanocobalamin (VITAMIN B 12 PO) Take 1 tablet by mouth daily.    [name] diltiazem (CARDIZEM) 30 MG tablet TAKE 1 TABLET BY MOUTH EVERY DAY FOR PALPITATIONS 90 tablet 2  . Ferrous Fumarate-Folic Acid 347-4 MG TABS Take 1 tablet by mouth daily. 30 each 3  . letrozole (FEMARA) 2.5 MG tablet Take 1 tablet (2.5 mg total) by mouth daily. 90 tablet 3  . loratadine (CLARITIN) 10 MG tablet Take 1 tablet (10 mg total) by mouth daily. 30 tablet 11  . LORazepam (ATIVAN) 1 MG tablet TAKE 1 TABLET(1 MG) BY MOUTH EVERY 8 HOURS AS NEEDED FOR ANXIETY 70 tablet 0  . palbociclib (IBRANCE) 125 MG tablet Take 1 tablet (125 mg total) by mouth daily. Take for 21 days on, 7 days off, repeat every 28 days. 21 tablet 0  . pantoprazole (PROTONIX) 40 MG tablet TAKE 1 TABLET(40 MG) BY MOUTH DAILY 90 tablet 1  . permethrin (ELIMITE) 5 % cream Apply from scalp  to toes, avoid face. Leave on 8-14 hours. Thoroughly rinse with soap and water. Repeat in 2 weeks if needed. 60 g 1  . rOPINIRole (REQUIP) 0.25 MG tablet Take 1 tablet (0.25 mg total) by mouth at bedtime. 30 tablet 0  . triamcinolone cream (KENALOG) 0.1 % Apply topically 2 (two) times daily. Apply to affected area 45 g 1   No current facility-administered medications for this visit.    PHYSICAL EXAMINATION: ECOG PERFORMANCE STATUS: 2 - Symptomatic, <50% confined to bed  Vitals:   07/29/20 1434  BP: 137/85  Pulse: 88  Resp: 18  Temp: 98 F (36.7 C)  SpO2: 96%   Filed Weights   07/29/20 1434  Weight: 140 lb 14.4 oz (63.9 kg)    GENERAL:alert, no distress and comfortable SKIN: skin color, texture, turgor are normal, no rashes or significant lesions (+) Right to mid chest skin nodules, less skin erythema  EYES: normal, Conjunctiva are pink and non-injected, sclera clear  NECK: supple, thyroid normal size, non-tender, without nodularity MUCOSA: (+) mild erythema under right dentures, healing mouth sore LYMPH:  no palpable lymphadenopathy in the cervical, axillary  LUNGS: clear to auscultation and percussion with normal breathing effort HEART: regular rate & rhythm and no murmurs and no lower extremity edema ABDOMEN:abdomen soft, non-tender and normal bowel sounds Musculoskeletal:no cyanosis of digits and no clubbing  NEURO: alert & oriented x 3 with fluent speech, no focal motor/sensory deficits BREAST: S/p right mastectomy and reconstruction with implant. Surgical incisions healed well. No palpable mass, nodules or adenopathy bilaterally. Left Breast exam benign.   LABORATORY DATA:  I have reviewed the data as listed CBC Latest Ref Rng & Units 07/29/2020 07/15/2020 05/25/2020  WBC 4.0 - 10.5 K/uL 3.6(L) 5.2 7.0  Hemoglobin 12.0 - 15.0 g/dL 12.2 12.3 12.4  Hematocrit 36 - 46 % 36.7 38.1 39.0  Platelets 150 - 400 K/uL 223 254 251     CMP Latest Ref Rng & Units 07/29/2020 07/15/2020  05/25/2020  Glucose 70 - 99 mg/dL 90 94 95  BUN 8 - 23 mg/dL _0 Creatinine 0.44 - 1.00 mg/dL 0.85 0.71 0.78  Sodium 135 -  145 mmol/L 140 141 142  Potassium 3.5 - 5.1 mmol/L 4.1 3.9 3.8  Chloride 98 - 111 mmol/L 103 104 104  CO2 22 - 32 mmol/L _0 Calcium 8.9 - 10.3 mg/dL 9.6 9.1 9.2  Total Protein 6.5 - 8.1 g/dL 7.5 6.7 7.3  Total Bilirubin 0.3 - 1.2 mg/dL 0.4 0.4 0.2(L)  Alkaline Phos 38 - 126 U/L 105 111 106  AST 15 - 41 U/L _1 ALT 0 - 44 U/L _2 RADIOGRAPHIC STUDIES: I have personally reviewed the radiological images as listed and agreed with the findings in the report. No results found.   ASSESSMENT & PLAN:  GENEVIVE PRINTUP is a 79 y.o. female with    1. Right Axillary Pain  -Her MRI in 10/30/2019 and PET scan on 06/23/2020 did not indicate new concerning right breast mass.  -Patient reports unprovoked intermittent right axillary pain (5/10), which occurs in afternoon and is constant until evening. She is still functioning well but has limited right arm ROM from this.  -On exam today her skin erythema and nodules on right and mid chest are slightly improved and overall stable. No true palpable mass in right breast or axilla.  -I discussed this pain can be from her cancer impacting her local nerves or from her older implant. She still wants further imaging. I will order MRI breast which is her next option. She is agreeable.  -If this is related to her breast implant, Dr Marla Roe may proceed with removal based on how controlled her skin metastasis -For pain she has been using 553m Tylenol 6 times a day, which I recommend to decrease to 4 times daily. She cannot tolerate aleve given her esophagitis. I discussed prescription pain medication and nerve pain medication. -I will call in Tramadol as needed for severe pain and Cymbalta once daily(07/29/20). She should use Tylenol less. She agreed. -She can continue moist heat as needed.    2. History of  recurrent right breast lobular carcinoma, in 2001, 2008 and chest wall recurrence in 2010, local right breast skin recurrence in 05/2020.ER+, PR-/HER2- -She was initially diagnosed in 01/2000 but unfortunately hadrecurrencetwiceand s/p right mastectomy with implant in place and chemotherapy and antiestrogen therapy.  -She unfortunately developed another recurrence involving the right front chest over the implant in 05/2020. Her PET from 06/23/20 showed no definitive hypermetabolic metastatic disease, but I reviewed scan can have limitation. -Surgeons, including Dr DMarla Roeand Dr CBrantley Stage feel surgery isunlikelyfeasible giventhe large area of skin metastasis anduncertainborder of disease. Without complete resection, her cancer is not curable, but still treatable with systemic treatment. Goal of care would be to control disease and prolong life. -I started her on first-line antiestrogen therapy letrozole on 06/17/20 and CDK4/6inhibitorIbrance (palbociclib) 1220m3 weeks on/1 week off. She only took 1 dose Ibrance but stopped due to concerns of side effects although she tolerated first dose well. I has restarted Ibrance again on 07/18/20. She is agreeable.  -Labs reviewed, CBC and CMP WNL except WBC 3.6. Continue Letrozole and Ibrance, tolerating well with mild mucositis. I recommend mouth rinse and non-alcoholic mouthwashes.  -Patient requests to be seen every 2 weeks for now.  -Patient notes concern of drug interaction with Ibrance and her Lipitor which can lead to increased Lipitor side effects. I will check with oral pharmacist and cardiologist. May reduce Lipitor dose as her 02/23/20 lipid panel was good.   3. Osteopenia  -Her new baseline  DEXA from 07/06/20 shows Lowest T-score -1.9 at AP spine and -1.8 at b/l hips.  -Will monitor on AI.   4. Left lateral Cervical LN with mild enlargement, small and stable, likely benign.Stable.   5. RadiationPulmonaryFibrosis, COPD, Emphysema,  Seizures -She continues to f/u with Pulmonologistand Neurologist Dr Berdine Addison.  6. 69m pleural based nodule -Her MRI abdomen from 07/09/20 shows 460mpleural-based nodule along the lateral left hemidiaphragm which is still non specific. Will monitor on future CT scan. I have low suspicion this is related to her breast cancer.   7. Recurrent Tick Bites, Skin rash of feet -I recommend she avoid wooded areas and cover her skin outdoors and shower right after.  -She has current mild skin bumps of feet, which is indeterminate, but healing. Will monitor. She can use hydrocortisone for itching and neosporin for any pus seen.    PLAN: -I called in Tramadol and Cymbalta -Continue Letrozole and Ibrance  -Breast MRI in 1-2 weeks for right axillary pain  -Lab and F/u in 2 weeks    No problem-specific Assessment & Plan notes found for this encounter.   No orders of the defined types were placed in this encounter.  All questions were answered. The patient knows to call the clinic with any problems, questions or concerns. No barriers to learning was detected. The total time spent in the appointment was 30 minutes.     YaTruitt MerleMD 07/29/2020   I, AmJoslyn Devonam acting as scribe for YaTruitt MerleMD.   I have reviewed the above documentation for accuracy and completeness, and I agree with the above.

## 2020-07-29 ENCOUNTER — Encounter: Payer: Self-pay | Admitting: Hematology

## 2020-07-29 ENCOUNTER — Other Ambulatory Visit: Payer: Self-pay

## 2020-07-29 ENCOUNTER — Inpatient Hospital Stay: Payer: PPO

## 2020-07-29 ENCOUNTER — Inpatient Hospital Stay: Payer: PPO | Admitting: Hematology

## 2020-07-29 VITALS — BP 137/85 | HR 88 | Temp 98.0°F | Resp 18 | Ht 65.75 in | Wt 140.9 lb

## 2020-07-29 DIAGNOSIS — Z853 Personal history of malignant neoplasm of breast: Secondary | ICD-10-CM

## 2020-07-29 DIAGNOSIS — D649 Anemia, unspecified: Secondary | ICD-10-CM

## 2020-07-29 DIAGNOSIS — C50911 Malignant neoplasm of unspecified site of right female breast: Secondary | ICD-10-CM

## 2020-07-29 DIAGNOSIS — C792 Secondary malignant neoplasm of skin: Secondary | ICD-10-CM | POA: Diagnosis not present

## 2020-07-29 LAB — CMP (CANCER CENTER ONLY)
ALT: 17 U/L (ref 0–44)
AST: 18 U/L (ref 15–41)
Albumin: 4 g/dL (ref 3.5–5.0)
Alkaline Phosphatase: 105 U/L (ref 38–126)
Anion gap: 9 (ref 5–15)
BUN: 14 mg/dL (ref 8–23)
CO2: 28 mmol/L (ref 22–32)
Calcium: 9.6 mg/dL (ref 8.9–10.3)
Chloride: 103 mmol/L (ref 98–111)
Creatinine: 0.85 mg/dL (ref 0.44–1.00)
GFR, Est AFR Am: >60 mL/min (ref >60)
GFR, Estimated: >60 mL/min (ref >60)
Glucose, Bld: 90 mg/dL (ref 70–99)
Potassium: 4.1 mmol/L (ref 3.5–5.1)
Sodium: 140 mmol/L (ref 135–145)
Total Bilirubin: 0.4 mg/dL (ref 0.3–1.2)
Total Protein: 7.5 g/dL (ref 6.5–8.1)

## 2020-07-29 LAB — CBC WITH DIFFERENTIAL (CANCER CENTER ONLY)
Abs Immature Granulocytes: 0.03 10*3/uL (ref 0.00–0.07)
Basophils Absolute: 0 10*3/uL (ref 0.0–0.1)
Basophils Relative: 1 %
Eosinophils Absolute: 0.1 10*3/uL (ref 0.0–0.5)
Eosinophils Relative: 1 %
HCT: 36.7 % (ref 36.0–46.0)
Hemoglobin: 12.2 g/dL (ref 12.0–15.0)
Immature Granulocytes: 1 %
Lymphocytes Relative: 43 %
Lymphs Abs: 1.6 10*3/uL (ref 0.7–4.0)
MCH: 31 pg (ref 26.0–34.0)
MCHC: 33.2 g/dL (ref 30.0–36.0)
MCV: 93.1 fL (ref 80.0–100.0)
Monocytes Absolute: 0.1 10*3/uL (ref 0.1–1.0)
Monocytes Relative: 4 %
Neutro Abs: 1.8 10*3/uL (ref 1.7–7.7)
Neutrophils Relative %: 50 %
Platelet Count: 223 10*3/uL (ref 150–400)
RBC: 3.94 MIL/uL (ref 3.87–5.11)
RDW: 13.5 % (ref 11.5–15.5)
WBC Count: 3.6 10*3/uL — ABNORMAL LOW (ref 4.0–10.5)
nRBC: 0 % (ref 0.0–0.2)

## 2020-07-29 LAB — VITAMIN D 25 HYDROXY (VIT D DEFICIENCY, FRACTURES): Vit D, 25-Hydroxy: 64.54 ng/mL (ref 30–100)

## 2020-07-30 ENCOUNTER — Encounter: Payer: Self-pay | Admitting: Hematology

## 2020-08-01 ENCOUNTER — Encounter: Payer: Self-pay | Admitting: Nurse Practitioner

## 2020-08-01 ENCOUNTER — Other Ambulatory Visit: Payer: Self-pay | Admitting: Hematology

## 2020-08-01 ENCOUNTER — Telehealth: Payer: Self-pay | Admitting: Family Medicine

## 2020-08-01 MED ORDER — DULOXETINE HCL 20 MG PO CPEP
20.0000 mg | ORAL_CAPSULE | Freq: Every day | ORAL | 1 refills | Status: DC
Start: 2020-08-01 — End: 2021-08-29

## 2020-08-01 MED ORDER — TRAMADOL HCL 50 MG PO TABS: 25.0000 mg | ORAL_TABLET | Freq: Three times a day (TID) | ORAL | 0 refills | Status: DC | PRN

## 2020-08-01 NOTE — Progress Notes (Unsigned)
Tramadol

## 2020-08-01 NOTE — Telephone Encounter (Signed)
[place]  [facility] # [phone]

## 2020-08-01 NOTE — Telephone Encounter (Signed)
Caller: [employer]   Patient referred to [facility] by Dr Charlett Blake per [facility] insurance rep. Patient will need a referral in order for [facility] to make appointment. Per Santiago Glad referral is also needed for insurance to cover cost at  [facility] , Because they are not in network.

## 2020-08-01 NOTE — Telephone Encounter (Signed)
Left message on machine that referral was sent to [facility] initially.

## 2020-08-02 ENCOUNTER — Encounter: Payer: Self-pay | Admitting: Hematology

## 2020-08-02 ENCOUNTER — Institutional Professional Consult (permissible substitution): Payer: PPO | Admitting: Plastic Surgery

## 2020-08-03 ENCOUNTER — Other Ambulatory Visit: Payer: Self-pay | Admitting: Hematology

## 2020-08-03 ENCOUNTER — Other Ambulatory Visit: Payer: Self-pay

## 2020-08-03 DIAGNOSIS — C792 Secondary malignant neoplasm of skin: Secondary | ICD-10-CM

## 2020-08-03 DIAGNOSIS — C50911 Malignant neoplasm of unspecified site of right female breast: Secondary | ICD-10-CM

## 2020-08-04 ENCOUNTER — Other Ambulatory Visit: Payer: Self-pay

## 2020-08-04 ENCOUNTER — Telehealth: Payer: Self-pay

## 2020-08-04 MED ORDER — LUBIPROSTONE 8 MCG PO CAPS: 8.0000 ug | ORAL_CAPSULE | Freq: Two times a day (BID) | ORAL | 3 refills | Status: DC

## 2020-08-04 MED ORDER — LUBIPROSTONE 8 MCG PO CAPS
8.0000 ug | ORAL_CAPSULE | Freq: Two times a day (BID) | ORAL | 3 refills | Status: DC
Start: 2020-08-04 — End: 2020-08-04

## 2020-08-04 NOTE — Telephone Encounter (Signed)
Prescription sent to [facility]

## 2020-08-04 NOTE — Telephone Encounter (Signed)
-----   Message from Willia Craze, NP sent at 08/03/2020  6:54 PM EDT ----- Eustaquio Maize this patient sent me a patient advice note. She is constipated and fiber, Dulcolax and MiraLAX not helpful.  Please call in Amitiza 8 mcg twice daily #60 with 3 refills.   If she calls back and insurance will not pay for it then let us try Linzess 145 mcg daily on an empty stomach #30 with 3 refills.  Thanks

## 2020-08-05 ENCOUNTER — Encounter: Payer: Self-pay | Admitting: Hematology

## 2020-08-05 ENCOUNTER — Encounter: Payer: Self-pay | Admitting: Plastic Surgery

## 2020-08-05 ENCOUNTER — Other Ambulatory Visit: Payer: Self-pay

## 2020-08-05 ENCOUNTER — Ambulatory Visit: Payer: PPO | Admitting: Plastic Surgery

## 2020-08-05 VITALS — HR 95 | Temp 98.5°F | Ht 65.5 in | Wt 142.0 lb

## 2020-08-05 DIAGNOSIS — C50811 Malignant neoplasm of overlapping sites of right female breast: Secondary | ICD-10-CM

## 2020-08-05 DIAGNOSIS — Z170 Estrogen receptor positive status [ER+]: Secondary | ICD-10-CM | POA: Diagnosis not present

## 2020-08-09 ENCOUNTER — Encounter: Payer: Self-pay | Admitting: Plastic Surgery

## 2020-08-09 ENCOUNTER — Telehealth: Payer: Self-pay

## 2020-08-09 NOTE — Progress Notes (Signed)
   Subjective:    Patient ID: Anne [name], female    DOB: [birth year], 79 y.o.   MRN: [id number]  The patient is a 79 year old white female here with her husband for consultation regarding her breast cancer.  In 2001 she was treated for right breast cancer which was lobular.  She had a lumpectomy with excision of 4 lymph nodes and then radiation.  In 2008 she had recurrent breast cancer on the right side which was treated with a mastectomy and chemotherapy.  She had a recurrence on the skin which was treated with chemotherapy.  She was reconstructed with a saline implant on the right and underwent a left mastopexy in 2009.  She then saw me for revision and improved symmetry.  Her original cancer was estrogen and progesterone positive and HER-2 positive.  Her preop bra size prior to any of the surgeries was 34 D.  She feels she is still a D cup.  She is 5 feet 5.5 inches tall and she weighs 142 pounds.  She has now been diagnosed with widespread right breast cancer of the skin.  It is the majority of the upper pole and seems to track to her lower neck area.  The implant appears to be stable.  Review of Systems  Constitutional: Negative.  Negative for activity change.  HENT: Negative.   Eyes: Negative.   Respiratory: Negative.  Negative for chest tightness.   Cardiovascular: Negative.   Gastrointestinal: Negative.   Genitourinary: Negative.   Musculoskeletal: Negative.   Hematological: Negative.   Psychiatric/Behavioral: Negative.        Objective:   Physical Exam Vitals and nursing note reviewed.  Constitutional:      Appearance: Normal appearance.  Cardiovascular:     Rate and Rhythm: Normal rate.     Pulses: Normal pulses.  Pulmonary:     Effort: Pulmonary effort is normal. No respiratory distress.  Neurological:     General: No focal deficit present.     Mental Status: She is alert and oriented to person, place, and time.  Psychiatric:        Mood and Affect: Mood normal.         Behavior: Behavior normal.        Assessment & Plan:  We had a detailed conversation about the patient's options as it applies to surgical intervention.   We discussed the available methods of breast reconstruction.  In this patient's case due to the location excision would be very difficult.  Reconstruction would be for comfort and not cosmesis.  Another option is removal of the right breast expander if it has any problem.  This would not excise any of the skin involved but is an option to take care of the implant if it leaks or bothers her in any way.  After the options were discussed we focused on the patient's desires and the procedure that was best for her based on all the information.  A total of 45 minutes of face-to-face time was spent in this encounter, of which >80% was spent in counseling.   The patient is going to take some time to think it over.  She is certainly not in any rush with moving towards surgery.  I expressed to her that I am happy to do a telemetry visit and talk to her again at any time.  She seemed to appreciate the discussion.  It was certainly good to see her again.

## 2020-08-09 NOTE — Telephone Encounter (Signed)
Released records to [facility] at [phone]   Release:  [phone]

## 2020-08-10 DIAGNOSIS — Z853 Personal history of malignant neoplasm of breast: Secondary | ICD-10-CM | POA: Diagnosis not present

## 2020-08-10 NOTE — Progress Notes (Signed)
[name]   [phone] [phone]   Clinic Follow up Note   Patient Care Team: Anne Lukes, MD as PCP - General (Family Medicine) Anne Records, MD as PCP - Cardiology (Cardiology) Anne Cancel, MD as Consulting Physician (Orthopedic Surgery) Anne Mayer, MD as Consulting Physician (Gastroenterology) Anne Jacks, MD as Consulting Physician (Ophthalmology) Anne [name], DDS (Oral Surgery) Anne [name] (Dentistry) Anne [name], Anne Bravo, MD as Referring Physician (Dermatology) Anne [name], Anne Lofty, DO as Attending Physician (Plastic Surgery) Anne Merle, MD as Consulting Physician (Hematology)  Date of Service:  08/12/2020  CHIEF COMPLAINT: F/u of recurrent right breast cancer and right breast pain   SUMMARY OF ONCOLOGIC HISTORY: Oncology History Overview Note  Cancer Staging No matching staging information was found for the patient.    Breast cancer, right breast ([place])  01/2000 Initial Diagnosis   Breast cancer, right breast ([place])  History of primary lobular right breast carcinoma. 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 I believe that she had local radiation, tho that information is not available in this EMR.   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 2008 (tho patient did not agree to surgery until July 2008), invasive lobular carcinoma ER 81%, PR 96%, Her 2 1+ (PM08-99) , 1.5 cm invasive lobular with 3 axillary nodes negative, LVSI and perineural invasion present ([id number]). She subsequently declined adjuvant systemic treatment . She  had a complicated course following right mastectomy, with difficult healing after right breast reconstruction.    07/2009 Relapse/Recurrence   She had skin recurrence lateral right chest wall excised in Sept 2010 then treated with xeloda from Oct 2010 thru Feb 2011 and began on Arimidex July 2011; she may have stopped Arimidex after bone density scan 02-2014, or possibly the year prior (?).    2015 Imaging   Bone density scan [facility] 02-15-14 still osteopenic range, slightly lower in LS compared with 2013 and stable in hips.   05/19/2020 Mammogram   IMPRESSION: No mammographic evidence of malignancy. A result letter of this screening mammogram will be mailed directly to the patient.     06/03/2020 Relapse/Recurrence   skin punch biopsy of the right upper anterior chest wall which shows infiltrating carcinoma, the tumor cells are strongly positive for cytokeratin 7 and GATA-3 staining and are negative for cytokeratin 20, the combined histologic and IHC findings support a diagnosis of breast carcinoma, ER 100% positive with moderate-weak staining intensity, progesterone 0% negative, HER-2 equivocal by IHC and negative by FISH.   Seen in [employer]   06/17/2020 -  Anti-estrogen oral therapy   First-line Letrozole 2.20m once daily starting 06/17/20 and Ibrance. D/c Ibrance 08/12/20 due to pt concerns and tolerance.    06/23/2020 PET scan   IMPRESSION: 1. No definite hypermetabolic metastatic disease. No hypermetabolic chest wall lesions. 2. Indeterminate small vague focus of mild hypermetabolism (max SUV 4.7) projecting over lateral left hemidiaphragm/superolateral spleen, without a discrete correlate on the noncontrast CT images. Suggest further evaluation with MRI abdomen without and with IV contrast. 3. Aortic Atherosclerosis (ICD10-I70.0) and Emphysema (ICD10-J43.9).   06/30/2020 - 08/12/2020 Antibody Plan   First-line AI  and Ibrance 167m for 3 weeks on/1 week off starting 06/30/20, but only took 1  dose Ibrance due to side effect concerns.            -She restarted on 07/18/20. Stopped last week of 07/2020 due to mouth sores and fatigue. Will d/c       CURRENT THERAPY:  Letrozole 2.536monce daily starting 06/17/20   INTERVAL HISTORY:  Anne HELLBERGs here for a follow up. She presents to the clinic with her husband and daughter. She notes she still has constant right axillary pain. It has slightly improved. Tylenol helps her pain. She notes she stopped both Letrozole and Ibrance last week with mouth sores and fatigue. She notes she is willing to retry letrozole, but is fine to d/c Ibrance. She note she was monitoring her sed rate with PCP. She has not seen her since April and would like to repeat. She is still on iron pill.     REVIEW OF SYSTEMS:   Constitutional: Denies fevers, chills or abnormal weight loss Eyes: Denies blurriness of vision Ears, nose, mouth, throat, and face: Denies mucositis or sore throat Respiratory: Denies cough, dyspnea or wheezes Cardiovascular: Denies palpitation, chest discomfort or lower extremity swelling Gastrointestinal:  Denies nausea, heartburn or change in bowel habits Skin: Denies abnormal skin rashes Lymphatics: Denies new lymphadenopathy or easy bruising Neurological:Denies numbness, tingling or new weaknesses Behavioral/Psych: Mood is stable, no new changes  All [name] systems were reviewed with the patient and are negative.  MEDICAL HISTORY:  Past Medical History:  Diagnosis Date   ALKALINE PHOSPHATASE, ELEVATED 03/15/2009   Allergic state 06/10/2012   Allergy    Anemia    Anxiety and depression 04/28/2011   Arthritis    Asthma    Atypical chest pain 11/30/2011   AVM (arteriovenous malformation) of colon 2011   cecum   Baker's cyst of knee 05/22/2011   Cancer ([facility],  08   XRT/chemo 01-02/ lobular invasive ca   Carotid artery disease ([facility]   a. Carotid duplex 03/2014: stable 1-39% BICA, f/u due 03/2016.   Chronic  alcoholism in remission ([facility]5/17/2012   Did not tolerate Klonopin, caused some confusion and bad dreams.     Clotting disorder (HCC)    COPD (chronic obstructive pulmonary disease) (HCC)    Dermatitis 11/17/53/3748 EE (eosinophilic esophagitis)    Emphysema of lung (HCC)    Esophageal ring    ESOPHAGEAL STRICTURE 03/29/2009   Fall 11/23/2012   Family history of breast cancer    Family history of colon cancer    Family history of ovarian cancer    Family history of pancreatic cancer    Folliculitis of nose 12/14/68/7867 GERD (gastroesophageal reflux disease) 09/29/2009   improved s/p cholecystectomy and esophagus dilatation   History of chicken pox    History of measles    History of shingles    2 episodes   Hx of echocardiogram    a. Echo 01/2013: mild LVH, EF 55-60%, normal wall motion, Gr 1 diast dysfn   Hyperlipidemia    Knee pain, bilateral 07/23/2011   Medicare annual wellness visit, subsequent 06/19/2015   Mixed hyperlipidemia 10/17/2010   Qualifier: Diagnosis of  By: FeMack Guise   Orthostasis    Osteopenia 03/14/2011   Osteoporosis    Personal history of chemotherapy 2001   Personal history of radiation therapy 2001   rt breast   PERSONAL HX BREAST CANCER 09/29/2009  PVC's (premature ventricular contractions)    a. Event monitor 01/2013: NSR, extensive PVCs.   Radial neck fracture 10/2011   minimally displaced   Substance abuse ([place])    In remission 3 years.    Urinary incontinence 03/19/2012   Vaginitis 05/22/2011    SURGICAL HISTORY: Past Surgical History:  Procedure Laterality Date   APPENDECTOMY     AUGMENTATION MAMMAPLASTY Right 05/27/2007   BREAST BIOPSY Right 01/02/2007   wire loc   BREAST BIOPSY  12/26/2006   BREAST LUMPECTOMY Right 2001   BREAST RECONSTRUCTION  2008, 2009, 2010   BREAST REDUCTION WITH MASTOPEXY Left 05/30/2017   Procedure: LEFT BREAST REDUCTION FOR SYMTERY WITH MASTOPEXY;  Surgeon: Wallace Going, DO;  Location: [place];  Service: Plastics;  Laterality: Left;   CHOLECYSTECTOMY  2010   COLONOSCOPY  09/05/10   [facility]   DENTAL SURGERY  05/2016   4 dental implants by Dr. Loyal Gambler.   ERCP  2010    CBD stone extraction    ESOPHAGOGASTRODUODENOSCOPY  01/08/2012   Procedure: ESOPHAGOGASTRODUODENOSCOPY (EGD);  Surgeon: Anne Mayer, MD;  Location: [facility];  Service: Endoscopy;  Laterality: N/A;   ESOPHAGOGASTRODUODENOSCOPY (EGD) WITH ESOPHAGEAL DILATION  2010, 2012   LAPAROSCOPIC APPENDECTOMY N/A 08/04/2016   Procedure: APPENDECTOMY LAPAROSCOPIC;  Surgeon: Michael Boston, MD;  Location: [facility];  Service: General;  Laterality: N/A;   LIPOSUCTION WITH LIPOFILLING Left 11/21/2017   Procedure: LIPOSUCTION FROM ABDOMEN WITH LIPOFILLING TO LEFT BREAST;  Surgeon: Wallace Going, DO;  Location: [place];  Service: Plastics;  Laterality: Left;   MASTECTOMY MODIFIED RADICAL Right 05/27/2007   , Mastectomy modified radical (08), breast reconstruction, CA lesions excised lateral abd wall 2010   MASTOPEXY Left 11/21/2017   Procedure: LEFT BREAST REVISION MASTOPEXY FOR SYMMETRY;  Surgeon: Wallace Going, DO;  Location: [place];  Service: Plastics;  Laterality: Left;   REDUCTION MAMMAPLASTY Left    SAVORY DILATION  01/08/2012   Procedure: SAVORY DILATION;  Surgeon: Anne Mayer, MD;  Location: [facility];  Service: Endoscopy;  Laterality: N/A;  need xray    I have reviewed the social history and family history with the patient and they are unchanged from previous note.  ALLERGIES:  is allergic to codeine, morphine, peanut-containing drug products, sorbitol, tomato, zofran, advil [ibuprofen], diphenhydramine hcl, and oxycodone.  MEDICATIONS:  Current Outpatient Medications  Medication Sig Dispense Refill   acetaminophen (TYLENOL) 500 MG tablet Take 500-1,000 mg by mouth every 6 (six) hours as needed (for  pain.).     amitriptyline (ELAVIL) 25 MG tablet TAKE 6 TABLETS(150 MG) BY MOUTH AT BEDTIME 180 tablet 2   atorvastatin (LIPITOR) 20 MG tablet Take 1 tablet (20 mg total) by mouth 2 (two) times daily. 180 tablet 3   Biotin (BIOTIN 5000) 5 MG CAPS Take by mouth.     Cholecalciferol (VITAMIN D3) 1000 UNITS CAPS Take 2,000 Units by mouth daily.      Cyanocobalamin (VITAMIN B 12 PO) Take 1 tablet by mouth daily.     diltiazem (CARDIZEM) 30 MG tablet TAKE 1 TABLET BY MOUTH EVERY DAY FOR PALPITATIONS 90 tablet 2   DULoxetine (CYMBALTA) 20 MG capsule Take 1 capsule (20 mg total) by mouth daily. 30 capsule 1   Ferrous Fumarate-Folic Acid 401-0 MG TABS Take 1 tablet by mouth daily. 30 each 3   letrozole (FEMARA) 2.5 MG tablet Take 1 tablet (2.5 mg total) by mouth daily. 90 tablet 3  loratadine (CLARITIN) 10 MG tablet Take 1 tablet (10 mg total) by mouth daily. 30 tablet 11   LORazepam (ATIVAN) 1 MG tablet TAKE 1 TABLET(1 MG) BY MOUTH EVERY 8 HOURS AS NEEDED FOR ANXIETY 70 tablet 0   lubiprostone (AMITIZA) 8 MCG capsule Take 1 capsule (8 mcg total) by mouth 2 (two) times daily with a meal. 60 capsule 3   palbociclib (IBRANCE) 125 MG tablet Take 1 tablet (125 mg total) by mouth daily. Take for 21 days on, 7 days off, repeat every 28 days. 21 tablet 0   pantoprazole (PROTONIX) 40 MG tablet TAKE 1 TABLET(40 MG) BY MOUTH DAILY 90 tablet 1   permethrin (ELIMITE) 5 % cream Apply from scalp to toes, avoid face. Leave on 8-14 hours. Thoroughly rinse with soap and water. Repeat in 2 weeks if needed. 60 g 1   rOPINIRole (REQUIP) 0.25 MG tablet Take 1 tablet (0.25 mg total) by mouth at bedtime. 30 tablet 0   traMADol (ULTRAM) 50 MG tablet Take 0.5-1 tablets (25-50 mg total) by mouth every 8 (eight) hours as needed. 20 tablet 0   triamcinolone cream (KENALOG) 0.1 % Apply topically 2 (two) times daily. Apply to affected area 45 g 1   No current facility-administered medications for this visit.     PHYSICAL EXAMINATION: ECOG PERFORMANCE STATUS: 1 - Symptomatic but completely ambulatory  Vitals:   08/12/20 1458  BP: 128/78  Pulse: 89  Resp: 18  Temp: 97.7 F (36.5 C)  SpO2: 98%   Filed Weights   08/12/20 1458  Weight: 144 lb 11.2 oz (65.6 kg)    Due to COVID19 we will limit examination to appearance. Patient had no complaints.  GENERAL:alert, no distress and comfortable SKIN: skin color normal, no rashes or significant lesions EYES: normal, Conjunctiva are pink and non-injected, sclera clear  NEURO: alert & oriented x 3 with fluent speech  BREAST: (+) Stable right breast skin erythema  LABORATORY DATA:  I have reviewed the data as listed CBC Latest Ref Rng & Units 08/12/2020 07/29/2020 07/15/2020  WBC 4.0 - 10.5 K/uL 4.4 3.6(L) 5.2  Hemoglobin 12.0 - 15.0 g/dL 11.0(L) 12.2 12.3  Hematocrit 36 - 46 % 32.8(L) 36.7 38.1  Platelets 150 - 400 K/uL 271 223 254     CMP Latest Ref Rng & Units 08/12/2020 07/29/2020 07/15/2020  Glucose 70 - 99 mg/dL 96 90 94  BUN 8 - 23 mg/dL 10 14 13   Creatinine 0.44 - 1.00 mg/dL 0.73 0.85 0.71  Sodium 135 - 145 mmol/L 141 140 141  Potassium 3.5 - 5.1 mmol/L 3.3(L) 4.1 3.9  Chloride 98 - 111 mmol/L 104 103 104  CO2 22 - 32 mmol/L 30 28 29   Calcium 8.9 - 10.3 mg/dL 9.0 9.6 9.1  Total Protein 6.5 - 8.1 g/dL 6.8 7.5 6.7  Total Bilirubin 0.3 - 1.2 mg/dL <0.2(L) 0.4 0.4  Alkaline Phos 38 - 126 U/L 94 105 111  AST 15 - 41 U/L 18 18 18   ALT 0 - 44 U/L 17 17 15       RADIOGRAPHIC STUDIES: I have personally reviewed the radiological images as listed and agreed with the findings in the report. MR BREAST BILATERAL W WO CONTRAST INC CAD  Result Date: 08/12/2020 CLINICAL DATA:  79 year old female for screening breast MRI - history of RIGHT breast carcinoma status post multiple recurrences and RIGHT mastectomy. LABS:  None performed today EXAM: BILATERAL BREAST MRI WITH AND WITHOUT CONTRAST TECHNIQUE: Multiplanar, multisequence MR images of both  breasts were obtained prior to and following the intravenous administration of 6 ml of Gadavist Three-dimensional MR images were rendered by post-processing of the original MR data on an independent workstation. The three-dimensional MR images were interpreted, and findings are reported in the following complete MRI report for this study. Three dimensional images were evaluated at the independent interpreting workstation using the DynaCAD thin client. COMPARISON:  Previous exams including 10/29/2019 breast MR FINDINGS: Breast composition: LEFT: C. Heterogeneous fibroglandular tissue. Background parenchymal enhancement: Minimal Right breast: Status post RIGHT mastectomy with implant again noted. No suspicious abnormalities identified. Left breast: No mass or abnormal enhancement. Lymph nodes: No abnormal appearing lymph nodes. Ancillary findings:  None. IMPRESSION: 1. No MRI evidence of breast malignancy. 2. Status post RIGHT mastectomy with implant. RECOMMENDATION: LEFT screening mammogram in July 2022 to resume annual mammogram schedule. Consider bilateral screening breast MRI in 1 year as clinically indicated. BI-RADS CATEGORY  1: Negative. Electronically Signed   By: Margarette Canada M.D.   On: 08/12/2020 10:10     ASSESSMENT & PLAN:  WILBURTA MILBOURN is a 79 y.o. female with    1. Right Axillary Pain  -Patient reports unprovoked intermittent right axillary pain (5/10), which occurs in afternoon and is constant until evening. She is still functioning well but has limited right arm ROM from this. She reports this started after Ibrance  -No true palpable mass on exams in right breast around implant or axilla.  -I reviewed and discussed her Breast MRI from 08/11/20 which did not show any mass or adenopahty  -She met with Dr Marla Roe last week who discussed extensive surgery. She could remove implant but would not put another implant in place.  -Patient thinks her pain is related to [place].  Currently her  axillary pain has improved since she came off Ibrance and managed on Tylenol.    2. History of recurrent right breast lobular carcinoma, in 2001, 2008 and chest wall recurrence in 2010, local right breast skin recurrence in 05/2020.ER+, PR-/HER2- -She was initially diagnosed in 01/2000 but unfortunately hadrecurrencetwiceand s/p right mastectomy with implant in place and chemotherapy and antiestrogen therapy.  -Sheunfortunately developed another recurrenceinvolving the right front chestover the implantin 05/2020. Her PET from 06/23/20 showed no definitive hypermetabolic metastatic disease, but I reviewed scan can have limitation. -Surgeons,including Dr Marla Roe and Dr Kingsley Callander surgery isunlikelyfeasible giventhe large area of skin metastasis anduncertainborder of disease.Withoutcomplete resection, her cancer is not curable, but still treatable with systemic treatment. Goal of care would be to control disease and prolong life. -I started her on first-lineantiestrogen therapy letrozoleon 06/17/20 andCDK4/6inhibitorIbrance (palbociclib) 124m 3 weeks on/1 week off.She only took 1 dose Ibrance but stopped due to concerns of side effects although she tolerated first dose well. She restarted Ibrance again on 07/18/20. -She stopped Letrozole and Ibrance last week of 07/2020 given concern of right chest/axillary pain. She has been feeling much better since then  -I discussed given her concern, will d/c Ibrance and restart Letrozole alone for now. -She is willing to restart Letrozole next week (08/15/20).  -Labs reviewed, Hg 11. Breast exam appearance stable. Will continue to monitor skin erythema.  -F/u in 1 month   3. Anemia  -She has had h/o of blood loss before.  -Hg 11 today (08/12/20). I will obtain iron panel today  -Continue oral iron with Vit C.   4.Osteopenia  -Her new baseline DEXA from 07/06/20 showsLowest T-score -1.9 at AP spineand -1.8 at b/l hips.  -Will monitor  on AI.  5. Left lateral Cervical LN with mild enlargement, small and stable, likely benign.Stable.  6. RadiationPulmonaryFibrosis, COPD, Emphysema, Seizures -She continues to f/u with Pulmonologistand Neurologist Dr Berdine Addison.  7. 44m pleural based nodule -Her MRI abdomen from 07/09/20 shows 460mleural-based nodule along the lateral left hemidiaphragm which is still non specific. Will monitor on future CT scan. I have low suspicion this is related to her breast cancer.   PLAN: -D/c Ibrance  -Restart Letrozole next week  -Lab and f/u in 4 weeks  -add iron study per pt's request     No problem-specific Assessment & Plan notes found for this encounter.   Orders Placed This Encounter  Procedures   Ferritin    Standing Status:   Standing    Number of Occurrences:   20    Standing Expiration Date:   08/12/2021   Iron and TIBC    Standing Status:   Standing    Number of Occurrences:   20    Standing Expiration Date:   08/12/2021   All questions were answered. The patient knows to call the clinic with any problems, questions or concerns. No barriers to learning was detected. The total time spent in the appointment was 30 minutes.     YaTruitt MerleMD 08/12/2020   I, AmJoslyn Devonam acting as scribe for YaTruitt MerleMD.   I have reviewed the above documentation for accuracy and completeness, and I agree with the above.

## 2020-08-11 ENCOUNTER — Other Ambulatory Visit: Payer: Self-pay

## 2020-08-11 ENCOUNTER — Ambulatory Visit (HOSPITAL_COMMUNITY)
Admission: RE | Admit: 2020-08-11 | Discharge: 2020-08-11 | Disposition: A | Discharge status: Home / Self Care | Payer: PPO | Source: Ambulatory Visit | Attending: Hematology | Admitting: Hematology

## 2020-08-11 DIAGNOSIS — C792 Secondary malignant neoplasm of skin: Secondary | ICD-10-CM | POA: Insufficient documentation

## 2020-08-11 DIAGNOSIS — C50911 Malignant neoplasm of unspecified site of right female breast: Secondary | ICD-10-CM | POA: Diagnosis not present

## 2020-08-11 DIAGNOSIS — R928 Other abnormal and inconclusive findings on diagnostic imaging of breast: Secondary | ICD-10-CM | POA: Diagnosis not present

## 2020-08-11 MED ORDER — GADOBUTROL 1 MMOL/ML IV SOLN
6.0000 mL | Freq: Once | INTRAVENOUS | Status: AC | PRN
  Administered 2020-08-11: 6 mL via INTRAVENOUS

## 2020-08-12 ENCOUNTER — Other Ambulatory Visit: Payer: Self-pay

## 2020-08-12 ENCOUNTER — Inpatient Hospital Stay (HOSPITAL_BASED_OUTPATIENT_CLINIC_OR_DEPARTMENT_OTHER): Payer: PPO | Admitting: Hematology

## 2020-08-12 ENCOUNTER — Inpatient Hospital Stay: Payer: PPO | Attending: Hematology

## 2020-08-12 VITALS — BP 128/78 | HR 89 | Temp 97.7°F | Resp 18 | Ht 65.5 in | Wt 144.7 lb

## 2020-08-12 DIAGNOSIS — C792 Secondary malignant neoplasm of skin: Secondary | ICD-10-CM | POA: Insufficient documentation

## 2020-08-12 DIAGNOSIS — Z800 Family history of malignant neoplasm of digestive organs: Secondary | ICD-10-CM | POA: Diagnosis not present

## 2020-08-12 DIAGNOSIS — M8589 Other specified disorders of bone density and structure, multiple sites: Secondary | ICD-10-CM | POA: Diagnosis not present

## 2020-08-12 DIAGNOSIS — C50911 Malignant neoplasm of unspecified site of right female breast: Secondary | ICD-10-CM | POA: Insufficient documentation

## 2020-08-12 DIAGNOSIS — J449 Chronic obstructive pulmonary disease, unspecified: Secondary | ICD-10-CM | POA: Diagnosis not present

## 2020-08-12 DIAGNOSIS — Z853 Personal history of malignant neoplasm of breast: Secondary | ICD-10-CM

## 2020-08-12 DIAGNOSIS — Z803 Family history of malignant neoplasm of breast: Secondary | ICD-10-CM | POA: Diagnosis not present

## 2020-08-12 DIAGNOSIS — Z79811 Long term (current) use of aromatase inhibitors: Secondary | ICD-10-CM | POA: Insufficient documentation

## 2020-08-12 DIAGNOSIS — D500 Iron deficiency anemia secondary to blood loss (chronic): Secondary | ICD-10-CM

## 2020-08-12 DIAGNOSIS — Z9011 Acquired absence of right breast and nipple: Secondary | ICD-10-CM | POA: Insufficient documentation

## 2020-08-12 DIAGNOSIS — Z79899 Other long term (current) drug therapy: Secondary | ICD-10-CM | POA: Insufficient documentation

## 2020-08-12 DIAGNOSIS — Z8041 Family history of malignant neoplasm of ovary: Secondary | ICD-10-CM | POA: Diagnosis not present

## 2020-08-12 DIAGNOSIS — C50912 Malignant neoplasm of unspecified site of left female breast: Secondary | ICD-10-CM | POA: Insufficient documentation

## 2020-08-12 DIAGNOSIS — F329 Major depressive disorder, single episode, unspecified: Secondary | ICD-10-CM | POA: Insufficient documentation

## 2020-08-12 DIAGNOSIS — D649 Anemia, unspecified: Secondary | ICD-10-CM

## 2020-08-12 DIAGNOSIS — F419 Anxiety disorder, unspecified: Secondary | ICD-10-CM | POA: Diagnosis not present

## 2020-08-12 DIAGNOSIS — Z170 Estrogen receptor positive status [ER+]: Secondary | ICD-10-CM | POA: Diagnosis not present

## 2020-08-12 LAB — CBC WITH DIFFERENTIAL (CANCER CENTER ONLY)
Abs Immature Granulocytes: 0.04 10*3/uL (ref 0.00–0.07)
Basophils Absolute: 0.1 10*3/uL (ref 0.0–0.1)
Basophils Relative: 1 %
Eosinophils Absolute: 0 10*3/uL (ref 0.0–0.5)
Eosinophils Relative: 1 %
HCT: 32.8 % — ABNORMAL LOW (ref 36.0–46.0)
Hemoglobin: 11 g/dL — ABNORMAL LOW (ref 12.0–15.0)
Immature Granulocytes: 1 %
Lymphocytes Relative: 35 %
Lymphs Abs: 1.6 10*3/uL (ref 0.7–4.0)
MCH: 31.7 pg (ref 26.0–34.0)
MCHC: 33.5 g/dL (ref 30.0–36.0)
MCV: 94.5 fL (ref 80.0–100.0)
Monocytes Absolute: 0.5 10*3/uL (ref 0.1–1.0)
Monocytes Relative: 10 %
Neutro Abs: 2.3 10*3/uL (ref 1.7–7.7)
Neutrophils Relative %: 52 %
Platelet Count: 271 10*3/uL (ref 150–400)
RBC: 3.47 MIL/uL — ABNORMAL LOW (ref 3.87–5.11)
RDW: 15 % (ref 11.5–15.5)
WBC Count: 4.4 10*3/uL (ref 4.0–10.5)
nRBC: 0 % (ref 0.0–0.2)

## 2020-08-12 LAB — CMP (CANCER CENTER ONLY)
ALT: 17 U/L (ref 0–44)
AST: 18 U/L (ref 15–41)
Albumin: 3.4 g/dL — ABNORMAL LOW (ref 3.5–5.0)
Alkaline Phosphatase: 94 U/L (ref 38–126)
Anion gap: 7 (ref 5–15)
BUN: 10 mg/dL (ref 8–23)
CO2: 30 mmol/L (ref 22–32)
Calcium: 9 mg/dL (ref 8.9–10.3)
Chloride: 104 mmol/L (ref 98–111)
Creatinine: 0.73 mg/dL (ref 0.44–1.00)
GFR, Est AFR Am: >60 mL/min (ref >60)
GFR, Estimated: >60 mL/min (ref >60)
Glucose, Bld: 96 mg/dL (ref 70–99)
Potassium: 3.3 mmol/L — ABNORMAL LOW (ref 3.5–5.1)
Sodium: 141 mmol/L (ref 135–145)
Total Bilirubin: <0.2 mg/dL — ABNORMAL LOW (ref 0.3–1.2)
Total Protein: 6.8 g/dL (ref 6.5–8.1)

## 2020-08-13 ENCOUNTER — Encounter: Payer: Self-pay | Admitting: Hematology

## 2020-08-15 ENCOUNTER — Telehealth: Payer: Self-pay | Admitting: Hematology

## 2020-08-15 LAB — IRON AND TIBC
Iron: 101 ug/dL (ref 41–142)
Saturation Ratios: 38 % (ref 21–57)
TIBC: 269 ug/dL (ref 236–444)
UIBC: 168 ug/dL (ref 120–384)

## 2020-08-15 LAB — FERRITIN: Ferritin: 78 ng/mL (ref 11–307)

## 2020-08-15 NOTE — Telephone Encounter (Signed)
Scheduled per 10/1 los. Unable to reach pt. Left voicemail with appt times and date.

## 2020-08-16 ENCOUNTER — Telehealth: Payer: Self-pay

## 2020-08-16 NOTE — Telephone Encounter (Signed)
-----   Message from Truitt Merle, MD sent at 08/14/2020  6:52 PM EDT ----- Please let pt know her K was slightly low 2 days ago. I do not think she can take large pills like KCL. Please encourage her to have K-rich diet. Will repeat when she comes in next time for lab.  Truitt Merle  08/14/2020

## 2020-08-16 NOTE — Telephone Encounter (Signed)
Pt made aware of lab results encouraged potassium rich diet explaining examples of  which foods to include in diet and follow up as scheduled

## 2020-08-23 DIAGNOSIS — G893 Neoplasm related pain (acute) (chronic): Secondary | ICD-10-CM | POA: Diagnosis not present

## 2020-08-23 DIAGNOSIS — C50911 Malignant neoplasm of unspecified site of right female breast: Secondary | ICD-10-CM | POA: Diagnosis not present

## 2020-08-23 DIAGNOSIS — M47816 Spondylosis without myelopathy or radiculopathy, lumbar region: Secondary | ICD-10-CM | POA: Diagnosis not present

## 2020-08-24 ENCOUNTER — Other Ambulatory Visit: Payer: PPO

## 2020-08-25 ENCOUNTER — Telehealth: Payer: Self-pay | Admitting: Family Medicine

## 2020-08-25 ENCOUNTER — Encounter: Payer: Self-pay | Admitting: Family Medicine

## 2020-08-25 NOTE — Telephone Encounter (Signed)
LMOM to return call.

## 2020-08-25 NOTE — Progress Notes (Signed)
  Chronic Care Management   Note  08/25/2020 Name: Anne [name] MRN: [id number] DOB: November 11, 1941  [name] is a 79 y.o. year old female who is a primary care patient of [name], MD. I reached out to [place] by phone today in response to a referral sent by [name]'s PCP, [name], MD.   [name] was given information about Chronic Care Management services today including:  1. CCM service includes personalized support from designated clinical staff supervised by her physician, including individualized plan of care and coordination with other care providers 2. 24/7 contact phone numbers for assistance for urgent and routine care needs. 3. Service will only be billed when office clinical staff spend 20 minutes or more in a month to coordinate care. 4. Only one practitioner may furnish and bill the service in a calendar month. 5. The patient may stop CCM services at any time (effective at the end of the month) by phone call to the office staff.   Patient agreed to services and verbal consent obtained.   Follow up plan:   Carley Perdue [employer] Scheduler

## 2020-09-04 ENCOUNTER — Other Ambulatory Visit: Payer: Self-pay

## 2020-09-04 ENCOUNTER — Emergency Department
Admission: EM | Admit: 2020-09-04 | Discharge: 2020-09-04 | Disposition: A | Discharge status: Home / Self Care | Payer: PPO | Source: Home / Self Care | Attending: Family Medicine | Admitting: Family Medicine

## 2020-09-04 DIAGNOSIS — M7061 Trochanteric bursitis, right hip: Secondary | ICD-10-CM

## 2020-09-04 DIAGNOSIS — M1611 Unilateral primary osteoarthritis, right hip: Secondary | ICD-10-CM

## 2020-09-04 MED ORDER — METHYLPREDNISOLONE ACETATE 80 MG/ML IJ SUSP
80.0000 mg | Freq: Once | INTRAMUSCULAR | Status: AC
  Administered 2020-09-04: 80 mg via INTRAMUSCULAR

## 2020-09-04 NOTE — Discharge Instructions (Addendum)
Apply ice pack right hip bursa for 20 to 30 minutes, 3 to 4 times daily  Continue until pain decreases.  May continue muscle relaxant and Tylenol as needed.

## 2020-09-04 NOTE — ED Triage Notes (Signed)
Pt states that she has a hx of hip pain and has received hip injections in the past. Pt states that the pain comes and goes. Right side of hip.

## 2020-09-04 NOTE — ED Provider Notes (Signed)
Anne [name] CARE    CSN: [id number] Arrival date & time: 09/04/20  1508      History   Chief Complaint Chief Complaint  Patient presents with  . Hip Pain    HPI Anne [name] is a 79 y.o. female.   Patient has a long history of bilateral hip arthritis and recurring hip pain, having had at least two intra-articular hip joint corticosteroid injections in the past.  She had seen orthopedist Dr. Alvan Dame in 2020 for hip pain.  Review of records reveals bilateral degenerative hip changes on x-ray done 01/14/19. Patient had been doing well until 3 days ago when she was doing hip range of motion exercises on her bed.  While laterally abducting her right hip she had sudden hip pain that has persisted.  She now has pain with ambulation and right hip movement.  The history is provided by the patient and the spouse.  Hip Pain This is a recurrent (3 days ago) problem. The problem occurs constantly. The problem has not changed since onset.Pertinent negatives include no abdominal pain. The symptoms are aggravated by walking, standing and exertion. The symptoms are relieved by position. She has tried acetaminophen (tramadol) for the symptoms. The treatment provided no relief.    Past Medical History:  Diagnosis Date  . ALKALINE PHOSPHATASE, ELEVATED 03/15/2009  . Allergic state 06/10/2012  . Allergy   . Anemia   . Anxiety and depression 04/28/2011  . Arthritis   . Asthma   . Atypical chest pain 11/30/2011  . 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 Baker's cyst of knee 05/22/2011  . Cancer ([place]) 01,  08   XRT/chemo 01-02/ lobular invasive ca  . Carotid artery disease ([place])    a. Carotid duplex 03/2014: stable 1-39% BICA, f/u due 03/2016.  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    . Clotting disorder ([place])   . COPD (chronic obstructive pulmonary disease) ([place])   . Dermatitis 11/23/2012  . EE  (eosinophilic esophagitis)   . Emphysema of lung ([place])   . Esophageal ring   . ESOPHAGEAL STRICTURE 03/29/2009  . Fall 11/23/2012  . Family history of breast cancer   . Family history of colon cancer   . Family history of ovarian cancer   . Family history of pancreatic cancer   . Folliculitis of nose 1/69/6789  . GERD (gastroesophageal reflux disease) 09/29/2009   improved s/p cholecystectomy and esophagus dilatation  . History of chicken pox   . History of measles   . History of shingles    2 episodes  . Hx of echocardiogram    a. Echo 01/2013: mild LVH, EF 55-60%, normal wall motion, Gr 1 diast dysfn  . Hyperlipidemia   . Knee pain, bilateral 07/23/2011  . Medicare annual wellness visit, subsequent 06/19/2015  . Mixed hyperlipidemia 10/17/2010   Qualifier: Diagnosis of  By: Mack Guise    . Orthostasis   . Osteopenia 03/14/2011  . Osteoporosis   . Personal history of chemotherapy 2001  . Personal history of radiation therapy 2001   rt breast  . 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 PVC's (premature ventricular contractions)    a. Event monitor 01/2013: NSR, extensive PVCs.  . Radial neck fracture 10/2011   minimally displaced  . Substance abuse ([place])    In remission 3 years.   . Urinary incontinence 03/19/2012  . Vaginitis 05/22/2011  Patient Active Problem List   Diagnosis Date Noted  . Emphysema lung ([place]) 06/06/2020  . RLS (restless legs syndrome) 02/24/2020  . 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 Facial trauma, sequela 08/09/2019  . Hyperglycemia 08/19/2018  . Mild intermittent asthma without complication 85/46/2703  . Sore throat 05/27/2018  . Asthma 04/30/2018  . 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 Palpitations 04/30/2018  . Skin lesion of face 02/07/2018  . Low back pain 09/23/2017  . Hypokalemia 08/11/2017  . Abdominal pain 08/11/2017  . SBO (small bowel obstruction) ([place]) 08/10/2017  . History of right breast cancer 03/21/2017  .  Genetic testing 09/13/2016  . Family history of breast cancer   . Family history of pancreatic cancer   . Family history of colon cancer   . Pain in the chest 05/16/2016  . Status post right breast reconstruction 05/11/2016  . Breast cancer 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 ([place]) 05/11/2016  . History of breast cancer in female 05/11/2016  . Osteopenia determined by x-ray 05/11/2016  . IBS (irritable bowel syndrome) 03/09/2016  . Dyspnea 06/19/2015  . Medicare annual wellness visit, subsequent 06/19/2015  . Urinary frequency 12/12/2014  . Breast cancer, right breast ([place]) 10/18/2014  . Superficial thrombophlebitis 03/16/2014  . Plant dermatitis 03/16/2014  . Chest wall pain 02/07/2014  . Primary localized osteoarthrosis, lower leg 01/21/2014  . Elevated sed rate 08/02/2013  . Dermatitis 11/23/2012  . Falls 11/23/2012  . Preventative health care 10/21/2012  . Allergy 06/10/2012  . Urinary incontinence 03/19/2012  . Anemia 12/21/2011  . Knee pain, bilateral 07/23/2011  . Baker's cyst of knee 05/22/2011  . Eosinophilic esophagitis 50/07/3817  . Anxiety and depression 04/28/2011  . 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 Constipation, chronic 03/15/2011  . Mixed hyperlipidemia 10/17/2010  . CAROTID ARTERY STENOSIS 01/13/2010  . GERD 09/29/2009  . PERSONAL HX BREAST CANCER 09/29/2009    Past Surgical History:  Procedure Laterality Date  . APPENDECTOMY    . AUGMENTATION MAMMAPLASTY Right 05/27/2007  . BREAST BIOPSY Right 01/02/2007   wire loc  . BREAST BIOPSY  12/26/2006  . BREAST LUMPECTOMY Right 2001  . BREAST RECONSTRUCTION  2008, 2009, 2010  . BREAST REDUCTION WITH MASTOPEXY Left 05/30/2017   Procedure: LEFT BREAST REDUCTION FOR SYMTERY WITH MASTOPEXY;  Surgeon: Wallace Going, DO;  Location: [facility];  Service: Plastics;  Laterality: Left;  . CHOLECYSTECTOMY  2010  . COLONOSCOPY  09/05/10   cecal avm's  . DENTAL SURGERY  05/2016   4 dental  implants by Dr. Loyal Gambler.  [name] ERCP  2010    CBD stone extraction   . ESOPHAGOGASTRODUODENOSCOPY  01/08/2012   Procedure: ESOPHAGOGASTRODUODENOSCOPY (EGD);  Surgeon: Gatha Mayer, MD;  Location: [facility];  Service: Endoscopy;  Laterality: N/A;  . ESOPHAGOGASTRODUODENOSCOPY (EGD) WITH ESOPHAGEAL DILATION  2010, 2012  . LAPAROSCOPIC APPENDECTOMY N/A 08/04/2016   Procedure: APPENDECTOMY LAPAROSCOPIC;  Surgeon: Michael Boston, MD;  Location: [facility];  Service: General;  Laterality: N/A;  . LIPOSUCTION WITH LIPOFILLING Left 11/21/2017   Procedure: LIPOSUCTION FROM ABDOMEN WITH LIPOFILLING TO LEFT BREAST;  Surgeon: Wallace Going, DO;  Location: [facility];  Service: Plastics;  Laterality: Left;  [name] MASTECTOMY MODIFIED RADICAL Right 05/27/2007   , Mastectomy modified radical (08), breast reconstruction, CA lesions excised lateral abd wall 2010  . MASTOPEXY Left 11/21/2017   Procedure: LEFT BREAST REVISION MASTOPEXY FOR SYMMETRY;  Surgeon: Wallace Going, DO;  Location: [facility];  Service: Plastics;  Laterality: Left;  . REDUCTION MAMMAPLASTY Left   . SAVORY DILATION  01/08/2012   Procedure: SAVORY DILATION;  Surgeon: Gatha Mayer, MD;  Location: [facility];  Service: Endoscopy;  Laterality: N/A;  need xray    OB History   No obstetric history on file.      Home Medications    Prior to Admission medications   Medication Sig Start Date End Date Taking? Authorizing Provider  acetaminophen (TYLENOL) 500 MG tablet Take 500-1,000 mg by mouth every 6 (six) hours as needed (for pain.).   Yes [provider]  amitriptyline (ELAVIL) 25 MG tablet TAKE 6 TABLETS(150 MG) BY MOUTH AT BEDTIME 06/20/20  Yes [name], MD  atorvastatin (LIPITOR) 20 MG tablet Take 1 tablet (20 mg total) by mouth 2 (two) times daily. 11/10/19  Yes Fay Records, MD  Biotin (BIOTIN 5000) 5 MG CAPS Take by mouth.   Yes [provider]  Cholecalciferol (VITAMIN D3)  1000 UNITS CAPS Take 2,000 Units by mouth daily.    Yes [provider]  Cyanocobalamin (VITAMIN B 12 PO) Take 1 tablet by mouth daily.   Yes [provider]  diltiazem (CARDIZEM) 30 MG tablet TAKE 1 TABLET BY MOUTH EVERY DAY FOR PALPITATIONS 02/22/20  Yes Fay Records, MD  Ferrous Fumarate-Folic Acid 308-6 MG TABS Take 1 tablet by mouth daily. 09/03/18  Yes [name], MD  letrozole [facility]) 2.5 MG tablet Take 1 tablet (2.5 mg total) by mouth daily. 06/16/20  Yes Alla Feeling, NP  loratadine (CLARITIN) 10 MG tablet Take 1 tablet (10 mg total) by mouth daily. 04/29/18  Yes [name], MD  LORazepam (ATIVAN) 1 MG tablet TAKE 1 TABLET(1 MG) BY MOUTH EVERY 8 HOURS AS NEEDED FOR ANXIETY 07/15/20  Yes [name], MD  pantoprazole (PROTONIX) 40 MG tablet TAKE 1 TABLET(40 MG) BY MOUTH DAILY 07/04/20  Yes [name], MD  permethrin (ELIMITE) 5 % cream Apply from scalp to toes, avoid face. Leave on 8-14 hours. Thoroughly rinse with soap and water. Repeat in 2 weeks if needed. 06/19/19  Yes Phelps, Erin O, PA-C  traMADol (ULTRAM) 50 MG tablet Take 0.5-1 tablets (25-50 mg total) by mouth every 8 (eight) hours as needed. 08/01/20  Yes Truitt Merle, MD  triamcinolone cream (KENALOG) 0.1 % Apply topically 2 (two) times daily. Apply to affected area 05/25/20  Yes Alla Feeling, NP  DULoxetine (CYMBALTA) 20 MG capsule Take 1 capsule (20 mg total) by mouth daily. 08/01/20   Truitt Merle, MD  lubiprostone (AMITIZA) 8 MCG capsule Take 1 capsule (8 mcg total) by mouth 2 (two) times daily with a meal. 08/04/20   Willia Craze, NP  palbociclib (IBRANCE) 125 MG tablet Take 1 tablet (125 mg total) by mouth daily. Take for 21 days on, 7 days off, repeat every 28 days. 06/19/20   Truitt Merle, MD  rOPINIRole (REQUIP) 0.25 MG tablet Take 1 tablet (0.25 mg total) by mouth at bedtime. 03/06/20   [name], MD    Family History Family History  Problem Relation Age of Onset  . Heart disease Father    . Lung cancer Father        smoker  . Cirrhosis Sister        Primary Biliary  . Stroke Maternal Grandmother   . Alcohol abuse Maternal Grandfather   . Heart disease Paternal Grandfather   . Esophageal cancer Paternal Grandfather   . Rectal cancer  Paternal Grandfather   . Anxiety disorder Sister   . Osteoporosis Sister   . Arthritis Sister        Rheumatoid  . Osteoporosis Sister   . Skin cancer Sister        multiple skin cancers, over 52 excisions.  . Other Mother        tic [place]  . Breast cancer Maternal Aunt        dx in her 63s  . Breast cancer Paternal Aunt        dx in her 49s  . Pancreatic cancer Maternal Uncle        dx in his 15s; smoker  . Breast cancer Maternal Aunt        dx in her 40s  . Breast cancer Maternal Aunt        possible breast cancer dx and died in her 41s  . Ovarian cancer Maternal Aunt 29  . Pancreatic cancer Maternal Aunt        dx in her 23s  . Stomach cancer Maternal Uncle   . Colon cancer Maternal Uncle   . Lung cancer Paternal Aunt   . Breast cancer Cousin        paternal first cousin  . Breast cancer Cousin        maternal first cousin  . Anesthesia problems Neg Hx   . Hypotension Neg Hx   . Malignant hyperthermia Neg Hx   . Pseudochol deficiency Neg Hx     Social History Social History   Tobacco Use  . Smoking status: Former Smoker    Packs/day: 1.00    Years: 50.00    Pack years: 50.00    Types: Cigarettes    Quit date: 05/22/2007    Years since quitting: 13.2  . Smokeless tobacco: Never Used  Vaping Use  . Vaping Use: Never used  Substance Use Topics  . Alcohol use: Not Currently  . Drug use: No     Allergies   Codeine, Morphine, Peanut-containing drug products, Sorbitol, Tomato, Zofran, Advil [ibuprofen], Diphenhydramine hcl, and Oxycodone   Review of Systems Review of Systems  Constitutional: Negative for chills, diaphoresis, fatigue and fever.  Gastrointestinal: Negative for abdominal pain.    Musculoskeletal:       Right hip pain  Skin: Negative.   All other systems reviewed and are negative.    Physical Exam Triage Vital Signs ED Triage Vitals  Enc Vitals Group     BP 09/04/20 1526 113/75     Pulse Rate 09/04/20 1526 92     Resp --      Temp 09/04/20 1526 98.4 F (36.9 C)     Temp Source 09/04/20 1526 Oral     SpO2 09/04/20 1526 95 %     Weight 09/04/20 1523 142 lb (64.4 kg)     Height 09/04/20 1523 5' 5.5" (1.664 m)     Head Circumference --      Peak Flow --      Pain Score 09/04/20 1523 9     Pain Loc --      Pain Edu? --      Excl. in [place]? --    No data found.  Updated Vital Signs BP 113/75 (BP Location: Left Arm)   Pulse 92   Temp 98.4 F (36.9 C) (Oral)   Ht 5' 5.5" (1.664 m)   Wt 64.4 kg   SpO2 95%   BMI 23.27 kg/m   Visual Acuity Right Eye Distance:  Left Eye Distance:   Bilateral Distance:    Right Eye Near:   Left Eye Near:    Bilateral Near:     Physical Exam Vitals and nursing note reviewed.  Constitutional:      General: She is not in acute distress. HENT:     Head: Normocephalic.     Mouth/Throat:     Mouth: Mucous membranes are moist.  Eyes:     Pupils: Pupils are equal, round, and reactive to light.  Cardiovascular:     Rate and Rhythm: Normal rate.  Pulmonary:     Effort: Pulmonary effort is normal.  Abdominal:     Tenderness: There is no abdominal tenderness.  Musculoskeletal:     Cervical back: Normal range of motion.     Lumbar back: No tenderness.     Right hip: Tenderness present. No crepitus. Decreased range of motion. Decreased strength.     Right lower leg: No edema.     Left lower leg: No edema.       Legs:     Comments: Right hip has relatively good passive flexion.  Passive internal/external rotation is decreased and causes pain. There is distinct tenderness over the greater trochanter.  Palpating the greater trochanter during resisted lateral abduction of the hip causes pain.   Skin:    General:  Skin is warm and dry.  Neurological:     General: No focal deficit present.     Mental Status: She is alert.      UC Treatments / Results  Labs (all labs ordered are listed, but only abnormal results are displayed) Labs Reviewed - No data to display  EKG   Radiology No results found.  Procedures Procedures (including critical care time)  Medications Ordered in UC Medications  methylPREDNISolone acetate (DEPO-MEDROL) injection 80 mg (has no administration in time range)    Initial Impression / Assessment and Plan / UC Course  I have reviewed the triage vital signs and the nursing notes.  Pertinent labs & imaging results that were available during my care of the patient were reviewed by me and considered in my medical decision making (see chart for details).    Administered systemic Depo Medrol 80mg  IM. Followup with Dr. Aundria Mems ([facility]) for further evaluation/treatment.    Final Clinical Impressions(s) / UC Diagnoses   Final diagnoses:  Primary osteoarthritis of right hip  Trochanteric bursitis, right hip     Discharge Instructions     Apply ice pack right hip bursa for 20 to 30 minutes, 3 to 4 times daily  Continue until pain decreases.  May continue muscle relaxant and Tylenol as needed.    ED Prescriptions    None        Kandra Nicolas, MD 09/05/20 [phone]

## 2020-09-05 ENCOUNTER — Telehealth: Payer: Self-pay | Admitting: Hematology

## 2020-09-05 ENCOUNTER — Encounter: Payer: Self-pay | Admitting: Hematology

## 2020-09-05 NOTE — Telephone Encounter (Signed)
Rescheduled appointments per 10/25 Inbasket msg. Called patient, no answer. Left message for patient with appointment date and time.

## 2020-09-06 ENCOUNTER — Encounter: Payer: Self-pay | Admitting: Nurse Practitioner

## 2020-09-07 ENCOUNTER — Telehealth: Payer: Self-pay | Admitting: Hematology

## 2020-09-07 NOTE — Telephone Encounter (Signed)
Cancelled appts on 10/28 and 11/4 per 10/26 sch message. Left voicemail for pt to call office to reschedule.

## 2020-09-08 ENCOUNTER — Inpatient Hospital Stay: Payer: PPO | Admitting: Nurse Practitioner

## 2020-09-08 ENCOUNTER — Other Ambulatory Visit: Payer: PPO

## 2020-09-09 ENCOUNTER — Other Ambulatory Visit: Payer: PPO

## 2020-09-09 ENCOUNTER — Ambulatory Visit: Payer: PPO | Admitting: Hematology

## 2020-09-09 DIAGNOSIS — M5124 Other intervertebral disc displacement, thoracic region: Secondary | ICD-10-CM | POA: Diagnosis not present

## 2020-09-09 DIAGNOSIS — C50911 Malignant neoplasm of unspecified site of right female breast: Secondary | ICD-10-CM | POA: Diagnosis not present

## 2020-09-13 ENCOUNTER — Other Ambulatory Visit: Payer: Self-pay | Admitting: Family Medicine

## 2020-09-15 ENCOUNTER — Other Ambulatory Visit: Payer: PPO

## 2020-09-15 ENCOUNTER — Ambulatory Visit: Payer: PPO | Admitting: Hematology

## 2020-09-19 DIAGNOSIS — R21 Rash and other nonspecific skin eruption: Secondary | ICD-10-CM | POA: Diagnosis not present

## 2020-09-19 DIAGNOSIS — R0789 Other chest pain: Secondary | ICD-10-CM | POA: Diagnosis not present

## 2020-09-19 DIAGNOSIS — Z79811 Long term (current) use of aromatase inhibitors: Secondary | ICD-10-CM | POA: Diagnosis not present

## 2020-09-19 DIAGNOSIS — G893 Neoplasm related pain (acute) (chronic): Secondary | ICD-10-CM | POA: Diagnosis not present

## 2020-09-19 DIAGNOSIS — K5909 Other constipation: Secondary | ICD-10-CM | POA: Diagnosis not present

## 2020-09-19 DIAGNOSIS — Z87891 Personal history of nicotine dependence: Secondary | ICD-10-CM | POA: Diagnosis not present

## 2020-09-19 DIAGNOSIS — M25511 Pain in right shoulder: Secondary | ICD-10-CM | POA: Diagnosis not present

## 2020-09-19 DIAGNOSIS — Z79899 Other long term (current) drug therapy: Secondary | ICD-10-CM | POA: Diagnosis not present

## 2020-09-19 DIAGNOSIS — Z08 Encounter for follow-up examination after completed treatment for malignant neoplasm: Secondary | ICD-10-CM | POA: Diagnosis not present

## 2020-09-19 DIAGNOSIS — Z853 Personal history of malignant neoplasm of breast: Secondary | ICD-10-CM | POA: Diagnosis not present

## 2020-09-19 DIAGNOSIS — R937 Abnormal findings on diagnostic imaging of other parts of musculoskeletal system: Secondary | ICD-10-CM | POA: Diagnosis not present

## 2020-09-19 DIAGNOSIS — J439 Emphysema, unspecified: Secondary | ICD-10-CM | POA: Diagnosis not present

## 2020-09-19 DIAGNOSIS — Z9189 Other specified personal risk factors, not elsewhere classified: Secondary | ICD-10-CM | POA: Diagnosis not present

## 2020-09-19 DIAGNOSIS — C50911 Malignant neoplasm of unspecified site of right female breast: Secondary | ICD-10-CM | POA: Diagnosis not present

## 2020-09-19 DIAGNOSIS — C792 Secondary malignant neoplasm of skin: Secondary | ICD-10-CM | POA: Diagnosis not present

## 2020-09-27 ENCOUNTER — Other Ambulatory Visit (HOSPITAL_BASED_OUTPATIENT_CLINIC_OR_DEPARTMENT_OTHER): Payer: Self-pay | Admitting: Internal Medicine

## 2020-09-27 ENCOUNTER — Ambulatory Visit: Payer: PPO | Attending: Internal Medicine

## 2020-09-27 DIAGNOSIS — Z23 Encounter for immunization: Secondary | ICD-10-CM

## 2020-09-27 MED FILL — PFIZER-BIONTECH COVID-19 VA: 30 | 1 days supply | Qty: 0 | Fill #0

## 2020-09-27 NOTE — Progress Notes (Signed)
° °  Covid-19 Vaccination Clinic  Name:  Anne [name]    MRN: [id number] DOB: 11/25/1940  09/27/2020  Anne [name] was observed post Covid-19 immunization for 15 minutes without incident. She was provided with Vaccine Information Sheet and instruction to access the V-Safe system.   Anne [name] was instructed to call 911 with any severe reactions post vaccine:  Difficulty breathing   Swelling of face and throat   A fast heartbeat   A bad rash all over body   Dizziness and weakness   Immunizations Administered    Name Date Dose VIS Date Route   Pfizer COVID-19 Vaccine 09/27/2020  2:51 PM 0.3 mL 08/31/2020 Intramuscular   Manufacturer: [place]   Lot: [id number]   NDC: [address]-4

## 2020-09-28 ENCOUNTER — Other Ambulatory Visit: Payer: Self-pay | Admitting: Family Medicine

## 2020-09-28 DIAGNOSIS — S299XXA Unspecified injury of thorax, initial encounter: Secondary | ICD-10-CM

## 2020-09-28 NOTE — Telephone Encounter (Signed)
I have refilled but she needs an appt for further refills

## 2020-09-28 NOTE — Telephone Encounter (Signed)
Last written: 07/15/20 Last ov: 02/23/20 Next ov: none Contract: 12/23/17 UDS: 12/23/17

## 2020-09-29 ENCOUNTER — Telehealth: Payer: Self-pay

## 2020-09-29 NOTE — Telephone Encounter (Signed)
Attempted to call to get pt set up for an appointment for Dr.[name].

## 2020-10-04 ENCOUNTER — Encounter: Payer: Self-pay | Admitting: Family Medicine

## 2020-10-04 NOTE — Telephone Encounter (Signed)
Chism, Shaakira A, CMA    Note Attempted to call to get pt set up for an appointment for Dr.[name].          09/29/20 3:37 PM Chism, Duard Brady, CMA attempted to contact Anne [name], Anne "Torie" (Left Message)

## 2020-10-05 ENCOUNTER — Other Ambulatory Visit: Payer: Self-pay | Admitting: Family Medicine

## 2020-10-05 DIAGNOSIS — K200 Eosinophilic esophagitis: Secondary | ICD-10-CM

## 2020-10-11 ENCOUNTER — Telehealth: Payer: Self-pay

## 2020-10-11 NOTE — Telephone Encounter (Signed)
Attempted to call Pt to schedule lab appointment for Sed rate. LVM for pt to call back.

## 2020-10-13 ENCOUNTER — Telehealth: Payer: Self-pay | Admitting: Family Medicine

## 2020-10-13 NOTE — Telephone Encounter (Signed)
Attempted to call pt to set up lab appointment. LVM to call back.

## 2020-10-13 NOTE — Telephone Encounter (Signed)
Patient states she goes to [facility] for her cancer treatment. They want to order some blood work. Patient wants to know if she could get it done here. She is unsure of the blood work type all she knows is to check her blood

## 2020-10-13 NOTE — Telephone Encounter (Signed)
Yes she can do labs here but she should check with her [facility] and confirm what they want her to do so we do not miss anything. I have already ordered the Sed rate she asked [place] to order a little while ago. Once we know more we can order here.

## 2020-10-14 ENCOUNTER — Encounter: Payer: Self-pay | Admitting: Family Medicine

## 2020-10-14 NOTE — Telephone Encounter (Signed)
4th attempt to contact pt for lab appt. Letter mailed to home

## 2020-10-15 ENCOUNTER — Other Ambulatory Visit: Payer: Self-pay | Admitting: Family Medicine

## 2020-10-15 DIAGNOSIS — D649 Anemia, unspecified: Secondary | ICD-10-CM

## 2020-10-17 ENCOUNTER — Encounter: Payer: Self-pay | Admitting: Family Medicine

## 2020-10-17 ENCOUNTER — Other Ambulatory Visit (INDEPENDENT_AMBULATORY_CARE_PROVIDER_SITE_OTHER): Payer: PPO

## 2020-10-17 ENCOUNTER — Other Ambulatory Visit: Payer: Self-pay

## 2020-10-17 DIAGNOSIS — K200 Eosinophilic esophagitis: Secondary | ICD-10-CM

## 2020-10-17 DIAGNOSIS — D649 Anemia, unspecified: Secondary | ICD-10-CM

## 2020-10-18 LAB — CBC
HCT: 39.4 % (ref 36.0–46.0)
Hemoglobin: 12.9 g/dL (ref 12.0–15.0)
MCHC: 32.6 g/dL (ref 30.0–36.0)
MCV: 96.9 fl (ref 78.0–100.0)
Platelets: 252 10*3/uL (ref 150.0–400.0)
RBC: 4.06 Mil/uL (ref 3.87–5.11)
RDW: 14.6 % (ref 11.5–15.5)
WBC: 7.4 10*3/uL (ref 4.0–10.5)

## 2020-10-18 LAB — IBC + FERRITIN
Ferritin: 49.9 ng/mL (ref 10.0–291.0)
Iron: 90 ug/dL (ref 42–145)
Saturation Ratios: 26.9 % (ref 20.0–50.0)
Transferrin: 239 mg/dL (ref 212.0–360.0)

## 2020-10-18 LAB — SEDIMENTATION RATE: Sed Rate: 50 mm/hr — ABNORMAL HIGH (ref 0–30)

## 2020-10-19 NOTE — Progress Notes (Signed)
(

## 2020-10-21 ENCOUNTER — Other Ambulatory Visit: Payer: Self-pay

## 2020-10-21 DIAGNOSIS — R739 Hyperglycemia, unspecified: Secondary | ICD-10-CM

## 2020-10-21 DIAGNOSIS — E782 Mixed hyperlipidemia: Secondary | ICD-10-CM

## 2020-10-22 ENCOUNTER — Other Ambulatory Visit: Payer: Self-pay | Admitting: Hematology

## 2020-10-24 DIAGNOSIS — Z9189 Other specified personal risk factors, not elsewhere classified: Secondary | ICD-10-CM | POA: Diagnosis not present

## 2020-10-24 DIAGNOSIS — R42 Dizziness and giddiness: Secondary | ICD-10-CM | POA: Diagnosis not present

## 2020-10-24 DIAGNOSIS — C50911 Malignant neoplasm of unspecified site of right female breast: Secondary | ICD-10-CM | POA: Diagnosis not present

## 2020-10-24 DIAGNOSIS — Z853 Personal history of malignant neoplasm of breast: Secondary | ICD-10-CM | POA: Diagnosis not present

## 2020-10-24 DIAGNOSIS — R21 Rash and other nonspecific skin eruption: Secondary | ICD-10-CM | POA: Diagnosis not present

## 2020-10-24 DIAGNOSIS — G629 Polyneuropathy, unspecified: Secondary | ICD-10-CM | POA: Diagnosis not present

## 2020-10-24 DIAGNOSIS — J439 Emphysema, unspecified: Secondary | ICD-10-CM | POA: Diagnosis not present

## 2020-10-24 DIAGNOSIS — K5909 Other constipation: Secondary | ICD-10-CM | POA: Diagnosis not present

## 2020-10-24 DIAGNOSIS — G893 Neoplasm related pain (acute) (chronic): Secondary | ICD-10-CM | POA: Diagnosis not present

## 2020-10-24 DIAGNOSIS — Z79811 Long term (current) use of aromatase inhibitors: Secondary | ICD-10-CM | POA: Diagnosis not present

## 2020-10-24 DIAGNOSIS — C50919 Malignant neoplasm of unspecified site of unspecified female breast: Secondary | ICD-10-CM | POA: Diagnosis not present

## 2020-10-24 DIAGNOSIS — Z08 Encounter for follow-up examination after completed treatment for malignant neoplasm: Secondary | ICD-10-CM | POA: Diagnosis not present

## 2020-10-24 DIAGNOSIS — Z79899 Other long term (current) drug therapy: Secondary | ICD-10-CM | POA: Diagnosis not present

## 2020-10-24 DIAGNOSIS — C792 Secondary malignant neoplasm of skin: Secondary | ICD-10-CM | POA: Diagnosis not present

## 2020-10-27 ENCOUNTER — Other Ambulatory Visit (HOSPITAL_BASED_OUTPATIENT_CLINIC_OR_DEPARTMENT_OTHER): Payer: Self-pay | Admitting: Internal Medicine

## 2020-10-27 MED FILL — FLUAD QUADRIVALENT 0.5 ML P: 0.5 | 1 days supply | Qty: 1 | Fill #0

## 2020-10-30 ENCOUNTER — Encounter: Payer: Self-pay | Admitting: Nurse Practitioner

## 2020-10-31 ENCOUNTER — Telehealth: Payer: Self-pay

## 2020-10-31 ENCOUNTER — Ambulatory Visit: Payer: Self-pay | Admitting: Pharmacist

## 2020-10-31 ENCOUNTER — Ambulatory Visit: Payer: PPO

## 2020-10-31 NOTE — Telephone Encounter (Signed)
Nurse Assessment Nurse: Verita Schneiders, RN, April Date/Time Eilene Ghazi Time): 10/29/2020 10:02:27 AM Confirm and document reason for call. If symptomatic, describe symptoms. ---Caller states she had flu shot Thursday and having pain in arm pit- started last last night; sharp pains every 10-20 minutes yesterday; this morning it is achy. Can not palpitate a lump/lymph node. No fever. She is a cancer patient, stage 4 breast cancer. Does the patient have any new or worsening symptoms? ---Yes Will a triage be completed? ---Yes Related visit to physician within the last 2 weeks? ---Yes Does the PT have any chronic conditions? (i.e. diabetes, asthma, this includes High risk factors for pregnancy, etc.) ---Yes List chronic conditions. ---stage 4 breast cancer; emphysema, right deviated lung Is this a behavioral health or substance abuse call? ---No Guidelines Guideline Title Affirmed Question Affirmed Notes Nurse Date/Time (Eastern Time) Immunization Reactions Influenza (TIV; Injection) injected vaccine reactions Beams, RN, April 10/29/2020 10:05:55 AM Disp. Time Eilene Ghazi Time) Disposition Final User 10/29/2020 10:09:31 AM Home Care Yes Beams, RN, April Caller Disagree/Comply Comply Caller Understands Yes PLEASE NOTE: All timestamps contained within this report are represented as [place] Standard Time. CONFIDENTIALTY NOTICE: This fax transmission is intended only for the addressee. It contains information that is legally privileged, confidential or otherwise protected from use or disclosure. If you are not the intended recipient, you are strictly prohibited from reviewing, disclosing, copying using or disseminating any of this information or taking any action in reliance on or regarding this information. If you have received this fax in error, please notify us immediately by telephone so that we can arrange for its return to [place]. Phone: [phone], Toll-Free: [phone], Fax: [phone] Page: 2 of  2 Call Id: [id number] PreDisposition Call Doctor Care Advice Given Per Guideline HOME CARE: * You should be able to treat this at home. * Vaccines protect [place] against serious diseases. * The symptoms mean the vaccine is working. They mean your immune system is building antibodies against the vaccine. The antibodies will protect you against the real disease. HEAT PACK FOR LOCAL REACTION AT VACCINE SITE: * Reason: Improve blood flow to the area. Reduce the pain and swelling. * Note: Some pain, redness and swelling at the injection site are NORMAL. It means the vaccine is working. Some people find that a cold pack works better than heat; follow your doctor's advice. * They are over-thecounter (OTC) drugs that help treat both fever and pain. You can buy them at the drugstore. * ACETAMINOPHEN - EXTRA STRENGTH TYLENOL: Take 1,000 mg (two 500 mg pills) every 8 hours as needed. Each Extra Strength Tylenol pill has 500 mg of acetaminophen. The most you should take each day is 3,000 mg (6 pills a day). * IBUPROFEN (E.G., MOTRIN, ADVIL): Take 400 mg (two 200 mg pills) by mouth every 6 hours. The most you should take each day is 1,200 mg (six 200 mg pills), unless your doctor has told you to take more. CALL BACK IF: * Pain lasts over 3 days * You become worse CARE ADVICE given per Immunization Reactions (Adult) guideline. Comments User: April, Beams, RN Date/Time Eilene Ghazi Time): 10/29/2020 10:10:36 AM discomfort improved some over night

## 2020-10-31 NOTE — Chronic Care Management (AMB) (Signed)
  Chronic Care Management   Outreach Note  10/31/2020 Name: Anne [name] MRN: [id number] DOB: Jan 26, 1941  Referred by: [name], MD Reason for referral : No chief complaint on file.   Third unsuccessful telephone outreach was attempted today. The patient was referred to the pharmacist for assistance with care management and care coordination.   Follow Up Plan:  Attempt reschedule at future date  De [place]anch, PharmD, BCACP Clinical Pharmacist [facility] [phone]

## 2020-10-31 NOTE — Chronic Care Management (AMB) (Deleted)
Chronic Care Management Pharmacy  Name: Anne [name]  MRN: [id number] DOB: 1941-01-14  Chief Complaint/ HPI  Anne [name],  79 y.o. , female presents for their Initial CCM visit with the clinical pharmacist In office.  PCP : Bradd Canary, MD  Their chronic conditions include: {CHL AMB CHRONIC MEDICAL CONDITIONS:[phone]}  Office Visits:***  Consult Visit:***  Medications: Outpatient Encounter Medications as of 10/31/2020  Medication Sig  . acetaminophen (TYLENOL) 500 MG tablet Take 500-1,000 mg by mouth every 6 (six) hours as needed (for pain.).  [name] amitriptyline (ELAVIL) 25 MG tablet TAKE 6 TABLETS(150 MG) BY MOUTH AT BEDTIME  . atorvastatin (LIPITOR) 20 MG tablet Take 1 tablet (20 mg total) by mouth 2 (two) times daily.  . Biotin (BIOTIN 5000) 5 MG CAPS Take by mouth.  . Cholecalciferol (VITAMIN D3) 1000 UNITS CAPS Take 2,000 Units by mouth daily.   . Cyanocobalamin (VITAMIN B 12 PO) Take 1 tablet by mouth daily.  [name] diltiazem (CARDIZEM) 30 MG tablet TAKE 1 TABLET BY MOUTH EVERY Avyn Coate FOR PALPITATIONS  . DULoxetine (CYMBALTA) 20 MG capsule Take 1 capsule (20 mg total) by mouth daily.  . Ferrous Fumarate-Folic Acid 324-1 MG TABS Take 1 tablet by mouth daily.  [name] letrozole (FEMARA) 2.5 MG tablet Take 1 tablet (2.5 mg total) by mouth daily.  [name] loratadine (CLARITIN) 10 MG tablet Take 1 tablet (10 mg total) by mouth daily.  [name] LORazepam (ATIVAN) 1 MG tablet TAKE 1 TABLET(1 MG) BY MOUTH EVERY 8 HOURS AS NEEDED FOR ANXIETY  . lubiprostone (AMITIZA) 8 MCG capsule Take 1 capsule (8 mcg total) by mouth 2 (two) times daily with a meal.  . palbociclib (IBRANCE) 125 MG tablet Take 1 tablet (125 mg total) by mouth daily. Take for 21 days on, 7 days off, repeat every 28 days.  . pantoprazole (PROTONIX) 40 MG tablet TAKE 1 TABLET(40 MG) BY MOUTH DAILY  . permethrin (ELIMITE) 5 % cream Apply from scalp to toes, avoid face. Leave on 8-14 hours. Thoroughly rinse with soap and water. Repeat  in 2 weeks if needed.  [name] rOPINIRole (REQUIP) 0.25 MG tablet Take 1 tablet (0.25 mg total) by mouth at bedtime.  . traMADol (ULTRAM) 50 MG tablet Take 0.5-1 tablets (25-50 mg total) by mouth every 8 (eight) hours as needed.  . triamcinolone cream (KENALOG) 0.1 % Apply topically 2 (two) times daily. Apply to affected area   No facility-administered encounter medications on file as of 10/31/2020.     Current Diagnosis/Assessment:  Goals Addressed   None     Hyperlipidemia   LDL goal < ***  Last lipids Lab Results  Component Value Date   CHOL 173 02/23/2020   HDL 54.50 02/23/2020   LDLCALC 87 02/23/2020   LDLDIRECT 117.4 01/13/2013   TRIG 160.0 (H) 02/23/2020   CHOLHDL 3 02/23/2020   Hepatic Function Latest Ref Rng & Units 08/12/2020 07/29/2020 07/15/2020  Total Protein 6.5 - 8.1 g/dL 6.8 7.5 6.7  Albumin 3.5 - 5.0 g/dL 8.2(N) 4.0 3.7  AST 15 - 41 U/L 18 18 18   ALT 0 - 44 U/L 17 17 15   Alk Phosphatase 38 - 126 U/L 94 105 111  Total Bilirubin 0.3 - 1.2 mg/dL <5.6(O) 0.4 0.4  Bilirubin, Direct 0.0 - 0.3 mg/dL - - -     The 13-YQMV ASCVD risk score Denman George DC Jr., et al., 2013) is: 25.4%   Values used to calculate the score:     Age: 78 years  Sex: Female     Is Non-Hispanic African American: No     Diabetic: No     Tobacco smoker: No     Systolic Blood Pressure: 120 mmHg     Is BP treated: Yes     HDL Cholesterol: 54.5 mg/dL     Total Cholesterol: 173 mg/dL   Patient has failed these meds in past: *** Patient is currently {CHL Controlled/Uncontrolled:[phone]} on the following medications:  . ***  We discussed:  {CHL HP Upstream Pharmacy [phone]}  Plan  Continue {CHL HP Upstream Pharmacy Plans:[phone]}   Palpitations    Patient has failed these meds in past: *** Patient is currently {CHL Controlled/Uncontrolled:[phone]} on the following medications: .   We discussed:  {CHL HP Upstream Pharmacy [phone]}  Plan  Continue  {CHL HP Upstream Pharmacy Plans:[phone]}   COPD / Asthma / Tobacco   Last spirometry score: ***  Gold Grade: {CHL HP Upstream Pharm COPD Gold ZOXWR:[id number]} Current COPD Classification:  {CHL HP Upstream Pharm COPD Classification:[phone]}  Eosinophil count:   Lab Results  Component Value Date/Time   EOSPCT 1 08/12/2020 02:40 PM   EOSPCT 2.5 09/04/2017 03:27 PM  %                               Eos (Absolute):  Lab Results  Component Value Date/Time   EOSABS 0.0 08/12/2020 02:40 PM   EOSABS 0.2 09/04/2017 03:27 PM    Tobacco Status:  Social History   Tobacco Use  Smoking Status Former Smoker  . Packs/Larkin Morelos: 1.00  . Years: 50.00  . Pack years: 50.00  . Types: Cigarettes  . Quit date: 05/22/2007  . Years since quitting: 13.4  Smokeless Tobacco Never Used    Patient has failed these meds in past: *** Patient is currently {CHL Controlled/Uncontrolled:[phone]} on the following medications: *** Using maintenance inhaler regularly? {yes/no:20286} Frequency of rescue inhaler use:  {CHL HP Upstream Pharm Inhaler BJYN:[id number]}  We discussed:  {CHL HP Upstream Pharmacy [phone]}  Plan  Continue {CHL HP Upstream Pharmacy Plans:[phone]}   Depression   No flowsheet data found.  Patient has failed these meds in past: *** Patient is currently {CHL Controlled/Uncontrolled:[phone]} on the following medications:  . ***  We discussed:  ***  Plan  Continue {CHL HP Upstream Pharmacy MVHQI:[id number]}  GERD/IBS   Patient has failed these meds in past: *** Patient is currently {CHL Controlled/Uncontrolled:[phone]} on the following medications:  . ***  Breakthrough Sx: Breakthrough Tx:  Triggers:  We discussed:  ***  Plan  Continue {CHL HP Upstream Pharmacy Plans:[phone]}   Osteopenia / Osteoporosis   Last DEXA Scan: ***   T-Score femoral neck: ***  T-Score total hip: ***  T-Score lumbar spine: ***  T-Score forearm radius:  ***  10-year probability of major osteoporotic fracture: ***  10-year probability of hip fracture: ***  Vit D, 25-Hydroxy  Date Value Ref Range Status  07/29/2020 64.54 30 - 100 ng/mL Final    Comment:    (NOTE) Vitamin D deficiency has been defined by the Institute of Medicine  and an Endocrine Society practice guideline as a level of serum 25-OH  vitamin D less than 20 ng/mL (1,2). The Endocrine Society went on to  further define vitamin D insufficiency as a level between 21 and 29  ng/mL (2).  1. IOM (Institute of Medicine). 2010. Dietary reference intakes for  calcium and D. [place]: [employer]. 2. Holick  MF, Binkley [place], Bischoff-Ferrari HA, et al. Evaluation,  treatment, and prevention of vitamin D deficiency: an Endocrine  Society clinical practice guideline, JCEM. 2011 Jul; 96(7): 1911-30.  Performed at [facility], [address] [place], [place] [address]      Patient {is;is not an osteoporosis candidate:23886}  Patient has failed these meds in past: *** Patient is currently {CHL Controlled/Uncontrolled:[phone]} on the following medications:  . ***  We discussed:  {Osteoporosis Counseling:23892}  Plan  Continue {CHL HP Upstream Pharmacy MVHQI:[id number]}   Vaccines   Reviewed and discussed patient's vaccination history.    Immunization History  Administered Date(s) Administered  . Fluad Quad(high Dose 65+) 09/17/2019  . Influenza Split 08/27/2011, 10/21/2012  . Influenza, High Dose Seasonal PF 08/01/2016, 09/19/2017  . Influenza, Quadrivalent, Recombinant, Inj, Pf 08/19/2018  . Influenza,inj,Quad PF,6+ Mos 07/31/2013, 12/07/2014, 11/01/2015  . PFIZER SARS-COV-2 Vaccination 01/10/2020, 02/09/2020, 09/27/2020  . Tdap 04/08/2013    Plan  Recommended patient receive *** vaccine in *** office.   Medication Management   Patient's preferred pharmacy is:  [facility] [address] - [place], [place] - [address] [place] [address] Phone: [phone] Fax: [phone]  [id number] - [place], [place] - [address] [facility] [address] [place] [address] Phone: [phone] Fax: [phone]  [facility] - [place], [place] - [address]egino Ramirez [address] [place] [address] Phone: [phone] Fax: [phone]  Uses pill box? {Yes or If no, why not?:20788} Pt endorses ***% compliance  We discussed: {Pharmacy options:24294}  Plan  {US Pharmacy HYWV:37106}    Follow up: *** month phone visit  Katrinka Blazing, PharmD, BCACP Clinical Pharmacist [facility] [phone]

## 2020-11-03 ENCOUNTER — Other Ambulatory Visit: Payer: Self-pay | Admitting: Internal Medicine

## 2020-11-10 ENCOUNTER — Other Ambulatory Visit: Payer: Self-pay | Admitting: Family Medicine

## 2020-11-10 DIAGNOSIS — S299XXA Unspecified injury of thorax, initial encounter: Secondary | ICD-10-CM

## 2020-11-10 NOTE — Telephone Encounter (Signed)
She needs an appt for more refills but have sent in this refill since it is a holiday weekend. Please call and arrange a follow up appt.

## 2020-11-10 NOTE — Telephone Encounter (Signed)
Requesting: lorazepam 1mg  Contract: 12/23/2017 UDS: 12/23/2017 Last Visit:  02/23/2020 Next Visit: None Last Refill: 09/28/2020 #70 and 0RF  Please Advise

## 2020-11-18 ENCOUNTER — Other Ambulatory Visit: Payer: Self-pay

## 2020-11-18 ENCOUNTER — Encounter: Payer: PPO | Admitting: Sports Medicine

## 2020-11-21 ENCOUNTER — Ambulatory Visit (INDEPENDENT_AMBULATORY_CARE_PROVIDER_SITE_OTHER): Payer: Medicare HMO

## 2020-11-21 ENCOUNTER — Other Ambulatory Visit: Payer: Self-pay

## 2020-11-21 ENCOUNTER — Telehealth: Payer: Self-pay

## 2020-11-21 ENCOUNTER — Ambulatory Visit (INDEPENDENT_AMBULATORY_CARE_PROVIDER_SITE_OTHER): Payer: Medicare HMO | Admitting: Sports Medicine

## 2020-11-21 DIAGNOSIS — M1611 Unilateral primary osteoarthritis, right hip: Secondary | ICD-10-CM

## 2020-11-21 DIAGNOSIS — M160 Bilateral primary osteoarthritis of hip: Secondary | ICD-10-CM

## 2020-11-21 MED ORDER — MELOXICAM 15 MG PO TABS: ORAL_TABLET | ORAL | 3 refills | Status: DC

## 2020-11-21 NOTE — Telephone Encounter (Signed)
Attempted to call to pt to get pt set for appoint with Dr. Charlett Blake.

## 2020-11-21 NOTE — Assessment & Plan Note (Signed)
Moderate to severe bilateral hip osteoarthritis noted on x-rays from almost 2 years ago, repeat x-rays today, she does have pain in the right groin with internal rotation consistent with hip joint pain generator, I injected her hip joint today, adding meloxicam, physical therapy, repeat x-rays. Return to see me in 1 month. I do think she is pretty close to needing hip arthroplasty.

## 2020-11-21 NOTE — Progress Notes (Signed)
    Procedures performed today:    Procedure: Real-time Ultrasound Guided injection of the right hip joint Device: Samsung HS60  Verbal informed consent obtained.  Time-out conducted.  Noted no overlying erythema, induration, or other signs of local infection.  Skin prepped in a sterile fashion.  Local anesthesia: Topical Ethyl chloride.  With sterile technique and under real time ultrasound guidance: 1 cc Kenalog 40, 2 cc lidocaine, 2 cc bupivacaine injected easily Completed without difficulty  Advised to call if fevers/chills, erythema, induration, drainage, or persistent bleeding.  Images permanently stored and available for review in PACS.  Impression: Technically successful ultrasound guided injection.  Independent interpretation of notes and tests performed by another provider:   None.  Brief History, Exam, Impression, and Recommendations:    Primary osteoarthritis of both hips Moderate to severe bilateral hip osteoarthritis noted on x-rays from almost 2 years ago, repeat x-rays today, she does have pain in the right groin with internal rotation consistent with hip joint pain generator, I injected her hip joint today, adding meloxicam, physical therapy, repeat x-rays. Return to see me in 1 month. I do think she is pretty close to needing hip arthroplasty.    ___________________________________________ Gwen Her. Dianah Field, M.D., ABFM., CAQSM. Primary Care and [place] Instructor of [place] [facility] of Medicine

## 2020-11-23 DIAGNOSIS — Z8589 Personal history of malignant neoplasm of other organs and systems: Secondary | ICD-10-CM | POA: Diagnosis not present

## 2020-11-23 DIAGNOSIS — R42 Dizziness and giddiness: Secondary | ICD-10-CM | POA: Diagnosis not present

## 2020-11-23 DIAGNOSIS — M8580 Other specified disorders of bone density and structure, unspecified site: Secondary | ICD-10-CM | POA: Diagnosis not present

## 2020-11-23 DIAGNOSIS — Z87891 Personal history of nicotine dependence: Secondary | ICD-10-CM | POA: Diagnosis not present

## 2020-11-23 DIAGNOSIS — Z79811 Long term (current) use of aromatase inhibitors: Secondary | ICD-10-CM | POA: Diagnosis not present

## 2020-11-23 DIAGNOSIS — C50911 Malignant neoplasm of unspecified site of right female breast: Secondary | ICD-10-CM | POA: Diagnosis not present

## 2020-11-23 DIAGNOSIS — K5909 Other constipation: Secondary | ICD-10-CM | POA: Diagnosis not present

## 2020-11-23 DIAGNOSIS — C792 Secondary malignant neoplasm of skin: Secondary | ICD-10-CM | POA: Diagnosis not present

## 2020-11-23 DIAGNOSIS — J439 Emphysema, unspecified: Secondary | ICD-10-CM | POA: Diagnosis not present

## 2020-11-23 DIAGNOSIS — Z79899 Other long term (current) drug therapy: Secondary | ICD-10-CM | POA: Diagnosis not present

## 2020-11-23 DIAGNOSIS — Z853 Personal history of malignant neoplasm of breast: Secondary | ICD-10-CM | POA: Diagnosis not present

## 2020-11-27 ENCOUNTER — Other Ambulatory Visit: Payer: Self-pay | Admitting: Internal Medicine

## 2020-11-28 ENCOUNTER — Ambulatory Visit: Payer: Medicare HMO | Admitting: Physical Therapy

## 2020-11-28 ENCOUNTER — Other Ambulatory Visit: Payer: Self-pay | Admitting: Family Medicine

## 2020-11-28 ENCOUNTER — Encounter: Payer: Self-pay | Admitting: Family Medicine

## 2020-11-28 DIAGNOSIS — R42 Dizziness and giddiness: Secondary | ICD-10-CM

## 2020-12-01 ENCOUNTER — Other Ambulatory Visit: Payer: Self-pay

## 2020-12-01 ENCOUNTER — Ambulatory Visit (INDEPENDENT_AMBULATORY_CARE_PROVIDER_SITE_OTHER): Payer: Medicare HMO | Admitting: Physical Therapy

## 2020-12-01 DIAGNOSIS — M25552 Pain in left hip: Secondary | ICD-10-CM | POA: Diagnosis not present

## 2020-12-01 DIAGNOSIS — M62838 Other muscle spasm: Secondary | ICD-10-CM

## 2020-12-01 DIAGNOSIS — R2681 Unsteadiness on feet: Secondary | ICD-10-CM

## 2020-12-01 DIAGNOSIS — M25551 Pain in right hip: Secondary | ICD-10-CM

## 2020-12-01 NOTE — Therapy (Addendum)
[facility] [place], [place], [address] Phone: [phone]   Fax:  [phone]  Physical Therapy Evaluation and Discharge Summary  Patient Details  Name: Anne [name] MRN: [id number] Date of Birth: [birth year] Referring Provider (PT): Dr. Dianah Field   Encounter Date: 12/01/2020   PT End of Session - 12/01/20 1409    Visit Number 1    Number of Visits 8    Date for PT Re-Evaluation 01/26/21    Authorization Type [employer]/MCR    Progress Note Due on Visit 10    PT Start Time 1409    PT Stop Time 1453    PT Time Calculation (min) 44 min    Activity Tolerance Patient tolerated treatment well    Behavior During Therapy [facility] for tasks assessed/performed           Past Medical History:  Diagnosis Date  . ALKALINE PHOSPHATASE, ELEVATED 03/15/2009  . Allergic state 06/10/2012  . Allergy   . Anemia   . Anxiety and depression 04/28/2011  . Arthritis   . Asthma   . Atypical chest pain 11/30/2011  . 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 Baker's cyst of knee 05/22/2011  . Cancer ([place]) 01,  08   XRT/chemo 01-02/ lobular invasive ca  . Carotid artery disease ([place])    a. Carotid duplex 03/2014: stable 1-39% BICA, f/u due 03/2016.  [name] Chronic alcoholism in remission ([place]) 03/29/2011   Did not tolerate Klonopin, caused some confusion and bad dreams.    . Clotting disorder ([place])   . COPD (chronic obstructive pulmonary disease) ([place])   . Dermatitis 11/23/2012  . EE (eosinophilic esophagitis)   . Emphysema of lung ([place])   . Esophageal ring   . ESOPHAGEAL STRICTURE 03/29/2009  . Fall 11/23/2012  . Family history of breast cancer   . Family history of colon cancer   . Family history of ovarian cancer   . Family history of pancreatic cancer   . Folliculitis of nose 0/05/6225  . GERD (gastroesophageal reflux disease) 09/29/2009   improved s/p cholecystectomy and esophagus dilatation  . History of  chicken pox   . History of measles   . History of shingles    2 episodes  . Hx of echocardiogram    a. Echo 01/2013: mild LVH, EF 55-60%, normal wall motion, Gr 1 diast dysfn  . Hyperlipidemia   . Knee pain, bilateral 07/23/2011  . Medicare annual wellness visit, subsequent 06/19/2015  . Mixed hyperlipidemia 10/17/2010   Qualifier: Diagnosis of  By: Mack Guise    . Orthostasis   . Osteopenia 03/14/2011  . Osteoporosis   . Personal history of chemotherapy 2001  . Personal history of radiation therapy 2001   rt breast  . 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 PVC's (premature ventricular contractions)    a. Event monitor 01/2013: NSR, extensive PVCs.  . Radial neck fracture 10/2011   minimally displaced  . Substance abuse ([place])    In remission 3 years.   . Urinary incontinence 03/19/2012  . Vaginitis 05/22/2011    Past Surgical History:  Procedure Laterality Date  . APPENDECTOMY    . AUGMENTATION MAMMAPLASTY Right 05/27/2007  . BREAST BIOPSY Right 01/02/2007   wire loc  . BREAST BIOPSY  12/26/2006  . BREAST LUMPECTOMY Right 2001  . BREAST RECONSTRUCTION  2008, 2009, 2010  . BREAST REDUCTION WITH MASTOPEXY Left 05/30/2017   Procedure: LEFT BREAST REDUCTION FOR SYMTERY WITH  MASTOPEXY;  Surgeon: Wallace Going, DO;  Location: [facility];  Service: Plastics;  Laterality: Left;  . CHOLECYSTECTOMY  2010  . COLONOSCOPY  09/05/10   cecal avm's  . DENTAL SURGERY  05/2016   4 dental implants by Dr. Loyal Gambler.  [name] ERCP  2010    CBD stone extraction   . ESOPHAGOGASTRODUODENOSCOPY  01/08/2012   Procedure: ESOPHAGOGASTRODUODENOSCOPY (EGD);  Surgeon: Gatha Mayer, MD;  Location: [facility];  Service: Endoscopy;  Laterality: N/A;  . ESOPHAGOGASTRODUODENOSCOPY (EGD) WITH ESOPHAGEAL DILATION  2010, 2012  . LAPAROSCOPIC APPENDECTOMY N/A 08/04/2016   Procedure: APPENDECTOMY LAPAROSCOPIC;  Surgeon: Michael Boston, MD;  Location: [facility];  Service: General;  Laterality: N/A;  .  LIPOSUCTION WITH LIPOFILLING Left 11/21/2017   Procedure: LIPOSUCTION FROM ABDOMEN WITH LIPOFILLING TO LEFT BREAST;  Surgeon: Wallace Going, DO;  Location: [facility];  Service: Plastics;  Laterality: Left;  [name] MASTECTOMY MODIFIED RADICAL Right 05/27/2007   , Mastectomy modified radical (08), breast reconstruction, CA lesions excised lateral abd wall 2010  . MASTOPEXY Left 11/21/2017   Procedure: LEFT BREAST REVISION MASTOPEXY FOR SYMMETRY;  Surgeon: Wallace Going, DO;  Location: [facility];  Service: Plastics;  Laterality: Left;  . REDUCTION MAMMAPLASTY Left   . SAVORY DILATION  01/08/2012   Procedure: SAVORY DILATION;  Surgeon: Gatha Mayer, MD;  [facility]: [facility];  Service: Endoscopy;  Laterality: N/A;  need xray    There were no vitals filed for this visit.    Subjective Assessment - 12/01/20 1412    Subjective Patient had an injection in the right hip last week with Dr. Darene Lamer. Pain was gone until yesterday and then took Meloxicam which helped. She reports no pain right now. Prior to the injection she had pain in the right groin with sit to stand and muscle spasms as well. She rarely has pain in left hip but did yesterday. She also reports dizziness which may be side effect of drug or inner ear disorder.    Pertinent History Current stage 4 CA, active tumors in chest and spine, emphesema/SOB, hyper hytosis frontalis Interna HFI, Osteoporosis, asthma, anxiety/depression, back pain    Diagnostic tests Xray - severe OA bil hips    Patient Stated Goals keep pain away    Currently in Pain? No/denies              [facility] PT Assessment - 12/01/20 0001      Assessment   Medical Diagnosis primary OA bil hips    Referring Provider (PT) Dr. Dianah Field    Onset Date/Surgical Date 10/12/20    Prior Therapy no      Precautions   Precautions Other (comment)    Precaution Comments active CA      Restrictions   Weight Bearing Restrictions No       Balance Screen   Has the patient fallen in the past 6 months No    Has the patient had a decrease in activity level because of a fear of falling?  No    Is the patient reluctant to leave their home because of a fear of falling?  No      Prior Function   Level of Independence Independent    Vocation Retired    Leisure active      Cognition   Overall Cognitive Status Within Functional Limits for tasks assessed      Observation/Other Assessments   Focus on Therapeutic Outcomes (FOTO)  FSPM =  79      Posture/Postural Control   Posture/Postural Control No significant limitations      ROM / Strength   AROM / PROM / Strength AROM;Strength      AROM   Overall AROM Comments Lumbar WNL; dizzy with ext; Bil hips WFL see limtations below    AROM Assessment Site Hip    Right/Left Hip Right;Left    Right Hip External Rotation  20    Right Hip Internal Rotation  30    Left Hip External Rotation  26    Left Hip Internal Rotation  30      Strength   Overall Strength Comments bil hip flex 4+/5 (weak core), ADD, ABD, ext, IR/ER 5/5 ; bil knees 5/5, DF 5/5      Flexibility   Soft Tissue Assessment /Muscle Length yes    Hamstrings Rt > Lt    Quadriceps right tight    ITB WNL bil    Piriformis bil tightness pain right in groin      Palpation   Palpation comment bil gluteals; right >Left      Special Tests    Special Tests Hip Special Tests    Hip Special Tests  Saralyn Pilar (FABER) Test;Ober's Test;Anterior Hip Impingement Test      Saralyn Pilar (FABER) Test   Findings Positive    Side Right    Comments neg left      Ober's Test   Comments neg bil      Anterior Hip Impingement Test    Findings Positive    Side  Right    Comments neg left      Transfers   Comments sit to stand without difficulty today                      Objective measurements completed on examination: See above findings.               PT Education - 12/01/20 1517    Education Details HEP     Person(s) Educated Patient    Methods Explanation;Demonstration;Handout    Comprehension Verbalized understanding;Returned demonstration            PT Short Term Goals - 12/01/20 1517      PT SHORT TERM GOAL #1   Title ind with initial HEP    Time 3    Period Weeks    Status New    Target Date 12/22/20             PT Long Term Goals - 12/01/20 1517      PT LONG TERM GOAL #1   Title Patent to report no return of hip pain with sit to stand transfers.    Time 8    Period Weeks    Status New    Target Date 01/26/21      PT LONG TERM GOAL #2   Title Improved hip right hip ER to equal left without increased pain in groin.    Time 8    Period Weeks    Status New      PT LONG TERM GOAL #3   Title Patient to demo improved core stability with MMT of BLEs to help with balance.    Time 8    Period Weeks    Status New      PT LONG TERM GOAL #4   Title Ind with advanced HEP to maintain and improve flexibility, strength and balance.    Time 8  Period Weeks    Status New                  Plan - 12/01/20 1501    Clinical Impression Statement Patient presents to PT with recent c/o of bil hip pain Rt> Lt. She received an injection 11/21/20 and denies pain until yesterday. She took her Meloxicam and pain has resolved. Prior to injection pt had pain and spasm in right groin with sit to stand. Now she has pain mainly with internal rotation. She has tightness in bil hip rotators, hamstrings and right quadriceps. She has limitations in bil hip IR and ER. She has minor strength deficits in her hips but demos core weakness with MMT. Patient has active Cancer and osteoporosis. She reports some dizziness as well which may be related to her meds or vestibular. She will benefit from PT to address these deficits.    Personal Factors and Comorbidities Comorbidity 3+    Comorbidities Current stage 4 CA, active tumors in chest and spine, emphesema/SOB, hyper hytosis frontalis Interna  HFI, Osteoporosis, asthma, anxiety/depression, back pain    Examination-Activity Limitations Transfers    Stability/Clinical Decision Making Stable/Uncomplicated    Clinical Decision Making Low    Rehab Potential Good    PT Frequency 1x / week    PT Duration 8 weeks    PT Treatment/Interventions ADLs/Self Care Home Management;Aquatic Therapy;Electrical Stimulation;Therapeutic exercise;Neuromuscular re-education;Balance training;Therapeutic activities;Dry needling;Manual techniques;Patient/family education;Taping    PT Next Visit Plan Review HEP; further screen balance/vestibular; LE flexibility, add decompression for upper body; extension biased core exercises; STW/manual to right ADDuctors, gluteals    PT Home Exercise Plan [email or web address]    Consulted and Agree with Plan of Care Patient           Patient will benefit from skilled therapeutic intervention in order to improve the following deficits and impairments:  Decreased range of motion,Pain,Impaired flexibility,Decreased strength,Decreased balance,Increased muscle spasms  Visit Diagnosis: Pain in right hip - Plan: PT plan of care cert/re-cert  Pain in left hip - Plan: PT plan of care cert/re-cert  Other muscle spasm - Plan: PT plan of care cert/re-cert  Unsteadiness on feet - Plan: PT plan of care cert/re-cert     Problem List Patient Active Problem List   Diagnosis Date Noted  . Emphysema lung ([place]) 06/06/2020  . RLS (restless legs syndrome) 02/24/2020  . 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 Facial trauma, sequela 08/09/2019  . Hyperglycemia 08/19/2018  . Mild intermittent asthma without complication 34/28/7681  . Sore throat 05/27/2018  . Asthma 04/30/2018  . 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 Palpitations 04/30/2018  . Skin lesion of face 02/07/2018  . Low back pain 09/23/2017  . Hypokalemia 08/11/2017  . Abdominal pain 08/11/2017  . SBO (small bowel obstruction) ([place]) 08/10/2017  . History of  right breast cancer 03/21/2017  . Genetic testing 09/13/2016  . Family history of breast cancer   . Family history of pancreatic cancer   . Family history of colon cancer   . Pain in the chest 05/16/2016  . Status post right breast reconstruction 05/11/2016  . Breast cancer 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 ([place]) 05/11/2016  . History of breast cancer in female 05/11/2016  . Osteopenia determined by x-ray 05/11/2016  . IBS (irritable bowel syndrome) 03/09/2016  . Dyspnea 06/19/2015  . Medicare annual wellness visit, subsequent 06/19/2015  . Urinary frequency 12/12/2014  . Breast cancer, right breast ([place]) 10/18/2014  . Superficial thrombophlebitis 03/16/2014  . Plant dermatitis 03/16/2014  .  Chest wall pain 02/07/2014  . Primary osteoarthritis of both hips 01/21/2014  . Elevated sed rate 08/02/2013  . Dermatitis 11/23/2012  . Falls 11/23/2012  . Preventative health care 10/21/2012  . Allergy 06/10/2012  . Urinary incontinence 03/19/2012  . Anemia 12/21/2011  . Knee pain, bilateral 07/23/2011  . Baker's cyst of knee 05/22/2011  . Eosinophilic esophagitis 98/42/1031  . Anxiety and depression 04/28/2011  . Chronic alcoholism in remission ([place]) 03/29/2011  . Constipation, chronic 03/15/2011  . Mixed hyperlipidemia 10/17/2010  . CAROTID ARTERY STENOSIS 01/13/2010  . GERD 09/29/2009  . PERSONAL HX BREAST CANCER 09/29/2009    Madelyn Flavors PT 12/01/2020, 8:03 PM  [facility] [place], [place], [address] Phone: [phone]   Fax:  [phone]  Name: Anne [name] MRN: [id number] Date of Birth: [birth year]  PHYSICAL THERAPY DISCHARGE SUMMARY  Visits from Start of Care: 1  Current functional level related to goals / functional outcomes: unknown   Remaining deficits: unknown   Education / Equipment: HEP  Plan: Patient agrees to discharge.  Patient goals were not met. Patient is being discharged due to not  returning since the last visit.  ?????    Madelyn Flavors, PT 01/24/21 1:26 PM  [facility] [place], [place] [address]  [phone] (office) [phone] (fax)

## 2020-12-01 NOTE — Patient Instructions (Signed)
RE-ALIGNMENT ROUTINE EXERCISES-OSTEOPROROSIS BASIC FOR POSTURAL CORRECTION  Leg Lengthener: stretches quadratus lumborum and hip flexors.  Strengthens quads and ankle dorsiflexors.  Leg Lengthener: Full    Straighten one leg. Pull toes AND forefoot toward knee, extend heel. Lengthen leg by pulling pelvis away from ribs. Hold __5_ seconds. Relax. Repeat 1 time. Re-bend knee. Do other leg. Each leg __5_ times. Surface: floor    Leg Lengthener / Leg Press Combo: Single Leg    Straighten one leg down to floor. Press leg down. DO NOT BEND KNEE. Hold _5__ seconds. Relax leg. Repeat exercise 1 time. Relax leg. Re-bend knee. Repeat with other leg. Do 5 times  Access Code: [email or web address] URL: [email or web address] Date: 12/01/2020 Prepared by: Almyra Free  Exercises Supine Hip Internal and External Rotation - 2 x daily - 7 x weekly - 1 sets - 2 reps - 30 sec hold Supine Butterfly Groin Stretch - 2 x daily - 7 x weekly - 1 sets - 2 reps - 60 sec hold

## 2020-12-08 ENCOUNTER — Encounter: Payer: Medicare HMO | Admitting: Physical Therapy

## 2020-12-11 ENCOUNTER — Encounter: Payer: Self-pay | Admitting: Physical Therapy

## 2020-12-12 ENCOUNTER — Other Ambulatory Visit: Payer: Self-pay | Admitting: Internal Medicine

## 2020-12-12 ENCOUNTER — Other Ambulatory Visit: Payer: Self-pay | Admitting: Family Medicine

## 2020-12-13 DIAGNOSIS — H31091 Other chorioretinal scars, right eye: Secondary | ICD-10-CM | POA: Diagnosis not present

## 2020-12-13 DIAGNOSIS — Z961 Presence of intraocular lens: Secondary | ICD-10-CM | POA: Diagnosis not present

## 2020-12-13 DIAGNOSIS — H353132 Nonexudative age-related macular degeneration, bilateral, intermediate dry stage: Secondary | ICD-10-CM | POA: Diagnosis not present

## 2020-12-13 DIAGNOSIS — H04123 Dry eye syndrome of bilateral lacrimal glands: Secondary | ICD-10-CM | POA: Diagnosis not present

## 2020-12-13 DIAGNOSIS — H26491 Other secondary cataract, right eye: Secondary | ICD-10-CM | POA: Diagnosis not present

## 2020-12-14 ENCOUNTER — Encounter: Payer: Self-pay | Admitting: Family Medicine

## 2020-12-15 ENCOUNTER — Encounter: Payer: Medicare HMO | Admitting: Physical Therapy

## 2020-12-15 ENCOUNTER — Other Ambulatory Visit: Payer: Self-pay | Admitting: Family Medicine

## 2020-12-15 MED ORDER — AMITRIPTYLINE HCL 25 MG PO TABS: 150.0000 mg | ORAL_TABLET | Freq: Every day | ORAL | 1 refills | Status: DC

## 2020-12-19 ENCOUNTER — Ambulatory Visit: Payer: Medicare HMO | Admitting: Sports Medicine

## 2020-12-20 ENCOUNTER — Other Ambulatory Visit: Payer: Self-pay | Admitting: Family Medicine

## 2020-12-20 DIAGNOSIS — S299XXA Unspecified injury of thorax, initial encounter: Secondary | ICD-10-CM

## 2020-12-20 NOTE — Telephone Encounter (Signed)
Requesting: ativan Contract: n/a UDS:n/a Last Visit: 004/2021 Next Visit:02/08/2020 Last Refill:11/10/2020  Please Advise

## 2020-12-26 ENCOUNTER — Telehealth: Payer: Self-pay

## 2020-12-26 NOTE — Telephone Encounter (Signed)
Nurse Assessment Nurse: Ronnald Ramp, RN, Miranda Date/Time Anne [name] Time): 12/26/2020 12:09:22 PM Confirm and document reason for call. If symptomatic, describe symptoms. ---Caller states his wife has been having SOB and general malaise since last night. BP 97/57 Does the patient have any new or worsening symptoms? ---Yes Will a triage be completed? ---Yes Related visit to physician within the last 2 weeks? ---No Does the PT have any chronic conditions? (i.e. diabetes, asthma, this includes High risk factors for pregnancy, etc.) ---Yes List chronic conditions. ---Breast cancer, high cholesterol Is this a behavioral health or substance abuse call? ---No Guidelines Guideline Title Affirmed Question Affirmed Notes Nurse Date/Time (Eastern Time) Breathing Difficulty Cancer treatment in past six months (or has cancer now) Ronnald Ramp, RN, Miranda 12/26/2020 12:14:09 PM Disp. Time Anne [name] Time) Disposition Final User 12/26/2020 12:01:35 PM Send to Urgent Queue Hilarie Fredrickson 12/26/2020 12:23:38 PM Go to ED Now Yes Ronnald Ramp, RN, Miranda PLEASE NOTE: All timestamps contained within this report are represented as [place] Standard Time. CONFIDENTIALTY NOTICE: This fax transmission is intended only for the addressee. It contains information that is legally privileged, confidential or otherwise protected from use or disclosure. If you are not the intended recipient, you are strictly prohibited from reviewing, disclosing, copying using or disseminating any of this information or taking any action in reliance on or regarding this information. If you have received this fax in error, please notify us immediately by telephone so that we can arrange for its return to [place]. Phone: [phone], Toll-Free: [phone], Fax: [phone] Page: 2 of 2 Call Id: [id number] [place] Disagree/Comply Comply Caller Understands Yes PreDisposition Call Doctor Care Advice Given Per Guideline GO TO ED NOW: * You need to be seen in the  Emergency Department. * Go to the ED at ___________ [place] now. Drive carefully. CARE ADVICE given per Breathing Difficulty (Adult) guideline. Referrals GO TO FACILITY UNDECIDED

## 2020-12-27 ENCOUNTER — Ambulatory Visit (INDEPENDENT_AMBULATORY_CARE_PROVIDER_SITE_OTHER): Payer: Medicare HMO | Admitting: Family Medicine

## 2020-12-27 ENCOUNTER — Encounter: Payer: Self-pay | Admitting: Family Medicine

## 2020-12-27 ENCOUNTER — Ambulatory Visit (HOSPITAL_BASED_OUTPATIENT_CLINIC_OR_DEPARTMENT_OTHER)
Admit: 2020-12-27 | Discharge: 2020-12-27 | Disposition: A | Discharge status: Home / Self Care | Payer: Medicare HMO | Source: Ambulatory Visit | Attending: Family Medicine | Admitting: Family Medicine

## 2020-12-27 ENCOUNTER — Other Ambulatory Visit: Payer: Self-pay

## 2020-12-27 VITALS — BP 116/74 | HR 89 | Temp 97.8°F | Resp 16 | Wt 143.8 lb

## 2020-12-27 DIAGNOSIS — R0789 Other chest pain: Secondary | ICD-10-CM | POA: Diagnosis not present

## 2020-12-27 DIAGNOSIS — R739 Hyperglycemia, unspecified: Secondary | ICD-10-CM

## 2020-12-27 DIAGNOSIS — R42 Dizziness and giddiness: Secondary | ICD-10-CM | POA: Diagnosis not present

## 2020-12-27 DIAGNOSIS — G4700 Insomnia, unspecified: Secondary | ICD-10-CM

## 2020-12-27 DIAGNOSIS — D649 Anemia, unspecified: Secondary | ICD-10-CM | POA: Diagnosis not present

## 2020-12-27 DIAGNOSIS — R69 Illness, unspecified: Secondary | ICD-10-CM | POA: Diagnosis not present

## 2020-12-27 DIAGNOSIS — E782 Mixed hyperlipidemia: Secondary | ICD-10-CM | POA: Diagnosis not present

## 2020-12-27 DIAGNOSIS — F419 Anxiety disorder, unspecified: Secondary | ICD-10-CM

## 2020-12-27 DIAGNOSIS — I700 Atherosclerosis of aorta: Secondary | ICD-10-CM | POA: Insufficient documentation

## 2020-12-27 DIAGNOSIS — J439 Emphysema, unspecified: Secondary | ICD-10-CM | POA: Insufficient documentation

## 2020-12-27 DIAGNOSIS — R0600 Dyspnea, unspecified: Secondary | ICD-10-CM

## 2020-12-27 DIAGNOSIS — R700 Elevated erythrocyte sedimentation rate: Secondary | ICD-10-CM

## 2020-12-27 DIAGNOSIS — F32A Depression, unspecified: Secondary | ICD-10-CM

## 2020-12-27 DIAGNOSIS — R059 Cough, unspecified: Secondary | ICD-10-CM

## 2020-12-27 DIAGNOSIS — R32 Unspecified urinary incontinence: Secondary | ICD-10-CM | POA: Diagnosis not present

## 2020-12-27 DIAGNOSIS — R509 Fever, unspecified: Secondary | ICD-10-CM | POA: Diagnosis not present

## 2020-12-27 NOTE — Telephone Encounter (Signed)
So her symptoms are different than usual with the SOB so if she is still having it she should go be seen. I can do an in person with her at 3:40 today or a VV with her tomorrow morning around 9. Of she can go to [facility] for evaluation. Are they able to check her vitals are her BP, pulse and oxygen wnl?

## 2020-12-27 NOTE — Telephone Encounter (Signed)
Looks like patient did not go anywhere in [facility].  She was offered an appointment yesterday but her husband declined. They were sent over for nurse triage and they advised her to go to ED now.   We also advised that yesterday before switching her to triage nurse.  Also husband mentioned that all you will usually do is blood work.  That is also what she initially wanted when she called.

## 2020-12-27 NOTE — Patient Instructions (Signed)
Vertigo Vertigo is the feeling that you or the things around you are moving when they are not. This feeling can come and go at any time. Vertigo often goes away on its own. This condition can be dangerous if it happens when you are doing activities like driving or working with machines. Your doctor will do tests to find the cause of your vertigo. These tests will also help your doctor decide on the best treatment for you. Follow these instructions at home: Eating and drinking  Drink enough fluid to keep your pee (urine) pale yellow.  Do not drink alcohol.      Activity  Return to your normal activities as told by your doctor. Ask your doctor what activities are safe for you.  In the morning, first sit up on the side of the bed. When you feel okay, stand slowly while you hold onto something until you know that your balance is fine.  Move slowly. Avoid sudden body or head movements or certain positions, as told by your doctor.  Use a cane if you have trouble standing or walking.  Sit down right away if you feel dizzy.  Avoid doing any tasks or activities that can cause danger to you or others if you get dizzy.  Avoid bending down if you feel dizzy. Place items in your home so that they are easy for you to reach without leaning over.  Do not drive or use heavy machinery if you feel dizzy. General instructions  Take over-the-counter and prescription medicines only as told by your doctor.  Keep all follow-up visits as told by your doctor. This is important. Contact a doctor if:  Your medicine does not help your vertigo.  You have a fever.  Your problems get worse or you have new symptoms.  Your family or friends see changes in your behavior.  The feeling of being sick to your stomach gets worse.  Your vomiting gets worse.  You lose feeling (have numbness) in part of your body.  You feel prickling and tingling in a part of your body. Get help right away if:  You have  trouble moving or talking.  You are always dizzy.  You pass out (faint).  You get very bad headaches.  You feel weak in your hands, arms, or legs.  You have changes in your hearing.  You have changes in how you see (vision).  You get a stiff neck.  Bright light starts to bother you. Summary  Vertigo is the feeling that you or the things around you are moving when they are not.  Your doctor will do tests to find the cause of your vertigo.  You may be told to avoid some tasks, positions, or movements.  Contact a doctor if your medicine is not helping, or if you have a fever, new symptoms, or a change in behavior.  Get help right away if you get very bad headaches, or if you have changes in how you speak, hear, or see. This information is not intended to replace advice given to you by your health care provider. Make sure you discuss any questions you have with your health care provider. Document Revised: 09/22/2018 Document Reviewed: 09/22/2018 Elsevier Patient Education  2021 [employer].

## 2020-12-27 NOTE — Telephone Encounter (Signed)
Patient does not want to do virtual.  She wants to come in to be seen.

## 2020-12-27 NOTE — Telephone Encounter (Signed)
Patient schedule for 340pm

## 2020-12-28 DIAGNOSIS — R42 Dizziness and giddiness: Secondary | ICD-10-CM | POA: Insufficient documentation

## 2020-12-28 DIAGNOSIS — G4700 Insomnia, unspecified: Secondary | ICD-10-CM | POA: Insufficient documentation

## 2020-12-28 NOTE — Progress Notes (Signed)
Subjective:    Patient ID: Anne [name], female    DOB: [birth year], 80 y.o.   MRN: [id number]  Chief Complaint  Patient presents with  . Shortness of Breath    Pt states that this problem started about a week ago.  . Dizziness    HPI Patient is in today for chronic medical concerns. No recent febrile illness or hospitalizations. She is concerned about a couple of weeks of feeling dizzine and woozey upon arising and some visual concerns when she looks at the blinds in her bedroom. They appear to be moving when they are not. She also notes increased congestion and episodes of shortness of breath at times. She does note if she takes a few deep breaths she feels better. No associated symptoms. At other times she has had some right sided anterior chest pain that lasts minutes. Has occurred off and on for years. She is following with ENT at [facility] and they have an MRI of her brain ordered for next week. She acknowledges feeling anxious about her health concerns. Denies palp/HA/fevers/GI or GU c/o. Taking meds as prescribed  Past Medical History:  Diagnosis Date  . ALKALINE PHOSPHATASE, ELEVATED 03/15/2009  . Allergic state 06/10/2012  . Allergy   . Anemia   . Anxiety and depression 04/28/2011  . Arthritis   . Asthma   . Atypical chest pain 11/30/2011  . 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 Baker's cyst of knee 05/22/2011  . Cancer ([place]) 01,  08   XRT/chemo 01-02/ lobular invasive ca  . Carotid artery disease ([place])    a. Carotid duplex 03/2014: stable 1-39% BICA, f/u due 03/2016.  [name] Chronic alcoholism in remission ([place]) 03/29/2011   Did not tolerate Klonopin, caused some confusion and bad dreams.    . Clotting disorder ([place])   . COPD (chronic obstructive pulmonary disease) ([place])   . Dermatitis 11/23/2012  . EE (eosinophilic esophagitis)   . Emphysema of lung ([place])   . Esophageal ring   . ESOPHAGEAL STRICTURE 03/29/2009  . Fall 11/23/2012  . Family history of breast  cancer   . Family history of colon cancer   . Family history of ovarian cancer   . Family history of pancreatic cancer   . Folliculitis of nose 07/10/9370  . GERD (gastroesophageal reflux disease) 09/29/2009   improved s/p cholecystectomy and esophagus dilatation  . History of chicken pox   . History of measles   . History of shingles    2 episodes  . Hx of echocardiogram    a. Echo 01/2013: mild LVH, EF 55-60%, normal wall motion, Gr 1 diast dysfn  . Hyperlipidemia   . Knee pain, bilateral 07/23/2011  . Medicare annual wellness visit, subsequent 06/19/2015  . Mixed hyperlipidemia 10/17/2010   Qualifier: Diagnosis of  By: Mack Guise    . Orthostasis   . Osteopenia 03/14/2011  . Osteoporosis   . Personal history of chemotherapy 2001  . Personal history of radiation therapy 2001   rt breast  . 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 PVC's (premature ventricular contractions)    a. Event monitor 01/2013: NSR, extensive PVCs.  . Radial neck fracture 10/2011   minimally displaced  . Substance abuse ([place])    In remission 3 years.   . Urinary incontinence 03/19/2012  . Vaginitis 05/22/2011    Past Surgical History:  Procedure Laterality Date  . APPENDECTOMY    . AUGMENTATION MAMMAPLASTY Right 05/27/2007  . BREAST  BIOPSY Right 01/02/2007   wire loc  . BREAST BIOPSY  12/26/2006  . BREAST LUMPECTOMY Right 2001  . BREAST RECONSTRUCTION  2008, 2009, 2010  . BREAST REDUCTION WITH MASTOPEXY Left 05/30/2017   Procedure: LEFT BREAST REDUCTION FOR SYMTERY WITH MASTOPEXY;  Surgeon: Wallace Going, DO;  Location: [place];  Service: Plastics;  Laterality: Left;  . CHOLECYSTECTOMY  2010  . COLONOSCOPY  09/05/10   cecal avm's  . DENTAL SURGERY  05/2016   4 dental implants by Dr. Loyal Gambler.  [name] ERCP  2010    CBD stone extraction   . ESOPHAGOGASTRODUODENOSCOPY  01/08/2012   Procedure: ESOPHAGOGASTRODUODENOSCOPY (EGD);  Surgeon: Gatha Mayer, MD;  Location: [facility];   Service: Endoscopy;  Laterality: N/A;  . ESOPHAGOGASTRODUODENOSCOPY (EGD) WITH ESOPHAGEAL DILATION  2010, 2012  . LAPAROSCOPIC APPENDECTOMY N/A 08/04/2016   Procedure: APPENDECTOMY LAPAROSCOPIC;  Surgeon: Michael Boston, MD;  Location: [facility];  Service: General;  Laterality: N/A;  . LIPOSUCTION WITH LIPOFILLING Left 11/21/2017   Procedure: LIPOSUCTION FROM ABDOMEN WITH LIPOFILLING TO LEFT BREAST;  Surgeon: Wallace Going, DO;  Location: [place];  Service: Plastics;  Laterality: Left;  [name] MASTECTOMY MODIFIED RADICAL Right 05/27/2007   , Mastectomy modified radical (08), breast reconstruction, CA lesions excised lateral abd wall 2010  . MASTOPEXY Left 11/21/2017   Procedure: LEFT BREAST REVISION MASTOPEXY FOR SYMMETRY;  Surgeon: Wallace Going, DO;  Location: [place];  Service: Plastics;  Laterality: Left;  . REDUCTION MAMMAPLASTY Left   . SAVORY DILATION  01/08/2012   Procedure: SAVORY DILATION;  Surgeon: Gatha Mayer, MD;  Location: [facility];  Service: Endoscopy;  Laterality: N/A;  need xray    Family History  Problem Relation Age of Onset  . Heart disease Father   . Lung cancer Father        smoker  . Cirrhosis Sister        Primary Biliary  . Stroke Maternal Grandmother   . Alcohol abuse Maternal Grandfather   . Heart disease Paternal Grandfather   . Esophageal cancer Paternal Grandfather   . Rectal cancer Paternal Grandfather   . Anxiety disorder Sister   . Osteoporosis Sister   . Arthritis Sister        Rheumatoid  . Osteoporosis Sister   . Skin cancer Sister        multiple skin cancers, over 40 excisions.  . Other Mother        tic [place]  . Breast cancer Maternal Aunt        dx in her 40s  . Breast cancer Paternal Aunt        dx in her 69s  . Pancreatic cancer Maternal Uncle        dx in his 10s; smoker  . Breast cancer Maternal Aunt        dx in her 8s  . Breast cancer Maternal Aunt        possible breast cancer  dx and died in her 63s  . Ovarian cancer Maternal Aunt 29  . Pancreatic cancer Maternal Aunt        dx in her 2s  . Stomach cancer Maternal Uncle   . Colon cancer Maternal Uncle   . Lung cancer Paternal Aunt   . Breast cancer Cousin        paternal first cousin  . Breast cancer Cousin        maternal first cousin  . Anesthesia problems  Neg Hx   . Hypotension Neg Hx   . Malignant hyperthermia Neg Hx   . Pseudochol deficiency Neg Hx     Social History   Socioeconomic History  . Marital status: Married    Spouse name: Sam  . Number of children: 3  . Years of education: Not on file  . Highest education level: Not on file  Occupational History  . Occupation: Social worker  Tobacco Use  . Smoking status: Former Smoker    Packs/day: 1.00    Years: 50.00    Pack years: 50.00    Types: Cigarettes    Quit date: 05/22/2007    Years since quitting: 13.6  . Smokeless tobacco: Never Used  Vaping Use  . Vaping Use: Never used  Substance and Sexual Activity  . Alcohol use: Not Currently  . Drug use: No  . Sexual activity: Yes    Partners: Male    Comment: lives with husband,   Other Topics Concern  . Not on file  Social History Narrative  . Not on file   Social Determinants of Health   Financial Resource Strain: Not on file  Food Insecurity: Not on file  Transportation Needs: Not on file  Physical Activity: Not on file  Stress: Not on file  Social Connections: Not on file  Intimate Partner Violence: Not on file    Outpatient Medications Prior to Visit  Medication Sig Dispense Refill  . acetaminophen (TYLENOL) 500 MG tablet Take 500-1,000 mg by mouth every 6 (six) hours as needed (for pain.).    [name] amitriptyline (ELAVIL) 25 MG tablet Take 6 tablets (150 mg total) by mouth at bedtime. 180 tablet 1  . atorvastatin (LIPITOR) 20 MG tablet Take 1 tablet by mouth in the morning and at bedtime. Please make overdue appt with Dr. Harrington Challenger before anymore refills. Thank you 2nd attempt 30  tablet 0  . Biotin 5 MG CAPS Take by mouth.    . Cholecalciferol (VITAMIN D3) 1000 UNITS CAPS Take 2,000 Units by mouth daily.    . Cyanocobalamin (VITAMIN B 12 PO) Take 1 tablet by mouth daily.    [name] diltiazem (CARDIZEM) 30 MG tablet Take 1 tablet (30 mg total) by mouth daily. For palpitations. Please make overdue appt with Dr. Harrington Challenger before anymore refills. Thank you 1st attempt 30 tablet 0  . DULoxetine (CYMBALTA) 20 MG capsule Take 1 capsule (20 mg total) by mouth daily. 30 capsule 1  . Ferrous Fumarate-Folic Acid 226-3 MG TABS Take 1 tablet by mouth daily. 30 each 3  . letrozole (FEMARA) 2.5 MG tablet Take 1 tablet (2.5 mg total) by mouth daily. 90 tablet 3  . loratadine (CLARITIN) 10 MG tablet Take 1 tablet (10 mg total) by mouth daily. 30 tablet 11  . LORazepam (ATIVAN) 1 MG tablet TAKE 1 TABLET(1 MG) BY MOUTH EVERY 8 HOURS AS NEEDED FOR ANXIETY 70 tablet 0  . meloxicam (MOBIC) 15 MG tablet One tab PO qAM with a meal for 2 weeks, then daily prn pain. 30 tablet 3  . palbociclib (IBRANCE) 125 MG tablet Take 1 tablet (125 mg total) by mouth daily. Take for 21 days on, 7 days off, repeat every 28 days. 21 tablet 0  . pantoprazole (PROTONIX) 40 MG tablet TAKE 1 TABLET(40 MG) BY MOUTH DAILY 90 tablet 1  . permethrin (ELIMITE) 5 % cream Apply from scalp to toes, avoid face. Leave on 8-14 hours. Thoroughly rinse with soap and water. Repeat in 2 weeks if needed. [place]  g 1  . rOPINIRole (REQUIP) 0.25 MG tablet Take 1 tablet (0.25 mg total) by mouth at bedtime. 30 tablet 0  . traMADol (ULTRAM) 50 MG tablet Take 0.5-1 tablets (25-50 mg total) by mouth every 8 (eight) hours as needed. 20 tablet 0  . triamcinolone cream (KENALOG) 0.1 % Apply topically 2 (two) times daily. Apply to affected area 45 g 1  . lubiprostone (AMITIZA) 8 MCG capsule Take 1 capsule (8 mcg total) by mouth 2 (two) times daily with a meal. 60 capsule 3   No facility-administered medications prior to visit.    Allergies  Allergen  Reactions  . Codeine Other (See Comments)    "flu like symptoms"  . Morphine Other (See Comments)    "flu like symptoms"  . Peanut-Containing Drug Products Hives  . Sorbitol Other (See Comments)    GI Issues  . Tomato Diarrhea  . Zofran Other (See Comments)    headache  . Advil [Ibuprofen] Other (See Comments)    Irritates throat.  . Diphenhydramine Hcl Other (See Comments)    "nervous and upset"  . Oxycodone Anxiety    Confusion with inability to think clearly    Review of Systems  Constitutional: Negative for fever and malaise/fatigue.  HENT: Positive for congestion.   Eyes: Negative for blurred vision.  Respiratory: Positive for shortness of breath.   Cardiovascular: Positive for chest pain. Negative for palpitations and leg swelling.  Gastrointestinal: Negative for abdominal pain, blood in stool and nausea.  Genitourinary: Negative for dysuria and frequency.  Musculoskeletal: Negative for falls.  Skin: Negative for rash.  Neurological: Positive for dizziness. Negative for loss of consciousness and headaches.  Endo/Heme/Allergies: Negative for environmental allergies.  Psychiatric/Behavioral: Negative for depression. The patient is nervous/anxious.        Objective:    Physical Exam Vitals and nursing note reviewed.  Constitutional:      General: She is not in acute distress.    Appearance: She is well-developed and well-nourished.  HENT:     Head: Normocephalic and atraumatic.     Nose: Nose normal.  Eyes:     General:        Right eye: No discharge.        Left eye: No discharge.  Cardiovascular:     Rate and Rhythm: Normal rate and regular rhythm.     Heart sounds: No murmur heard.   Pulmonary:     Effort: Pulmonary effort is normal.     Breath sounds: Normal breath sounds.  Abdominal:     General: Bowel sounds are normal.     Palpations: Abdomen is soft.     Tenderness: There is no abdominal tenderness.  Musculoskeletal:        General: No edema.      Cervical back: Normal range of motion and neck supple.  Skin:    General: Skin is warm and dry.  Neurological:     Mental Status: She is alert and oriented to person, place, and time.  Psychiatric:        Mood and Affect: Mood and affect normal.     BP 116/74   Pulse 89   Temp 97.8 F (36.6 C)   Resp 16   Wt 143 lb 12.8 oz (65.2 kg)   SpO2 98%   BMI 23.57 kg/m  Wt Readings from Last 3 Encounters:  12/27/20 143 lb 12.8 oz (65.2 kg)  09/04/20 142 lb (64.4 kg)  08/12/20 144 lb 11.2 oz (65.6 kg)  Diabetic Foot Exam - Simple   No data filed    Lab Results  Component Value Date   WBC 7.4 10/17/2020   HGB 12.9 10/17/2020   HCT 39.4 10/17/2020   PLT 252.0 10/17/2020   GLUCOSE 96 08/12/2020   CHOL 173 02/23/2020   TRIG 160.0 (H) 02/23/2020   HDL 54.50 02/23/2020   LDLDIRECT 117.4 01/13/2013   LDLCALC 87 02/23/2020   ALT 17 08/12/2020   AST 18 08/12/2020   NA 141 08/12/2020   K 3.3 (L) 08/12/2020   CL 104 08/12/2020   CREATININE 0.73 08/12/2020   BUN 10 08/12/2020   CO2 30 08/12/2020   TSH 4.00 02/23/2020   INR 1.02 08/12/2017   HGBA1C 5.4 02/23/2020    Lab Results  Component Value Date   TSH 4.00 02/23/2020   Lab Results  Component Value Date   WBC 7.4 10/17/2020   HGB 12.9 10/17/2020   HCT 39.4 10/17/2020   MCV 96.9 10/17/2020   PLT 252.0 10/17/2020   Lab Results  Component Value Date   NA 141 08/12/2020   K 3.3 (L) 08/12/2020   CHLORIDE 104 09/04/2017   CO2 30 08/12/2020   GLUCOSE 96 08/12/2020   BUN 10 08/12/2020   CREATININE 0.73 08/12/2020   BILITOT <0.2 (L) 08/12/2020   ALKPHOS 94 08/12/2020   AST 18 08/12/2020   ALT 17 08/12/2020   PROT 6.8 08/12/2020   ALBUMIN 3.4 (L) 08/12/2020   CALCIUM 9.0 08/12/2020   ANIONGAP 7 08/12/2020   EGFR >60 09/04/2017   GFR 91.65 08/06/2019   Lab Results  Component Value Date   CHOL 173 02/23/2020   Lab Results  Component Value Date   HDL 54.50 02/23/2020   Lab Results  Component Value  Date   LDLCALC 87 02/23/2020   Lab Results  Component Value Date   TRIG 160.0 (H) 02/23/2020   Lab Results  Component Value Date   CHOLHDL 3 02/23/2020   Lab Results  Component Value Date   HGBA1C 5.4 02/23/2020       Assessment & Plan:   Problem List Items Addressed This Visit    Mixed hyperlipidemia - Primary    Tolerating statin, encouraged heart healthy diet, avoid trans fats, minimize simple carbs and saturated fats. Increase exercise as tolerated      Relevant Orders   Comprehensive metabolic panel   TSH   Lipid panel   Anxiety and depression    Has had been having increased anxiety but is generally well controlloned on Cymbalta 20 mg daily and Amitriptyline 150 mg daily      Elevated sed rate    Monitor       Relevant Orders   Sedimentation rate   Anemia   Urinary incontinence   Dyspnea (Chronic)    She notes some increased congestion and shortness of breath this past week. Check labs and a chest xray. Report worsening info.       Chest wall pain    Notes some right sided chest pain only last minutes and occurs infrequently. No associated symptoms. Check CXR and report any new concerns or worsening symptoms.       Hyperglycemia    hgba1c acceptable, minimize simple carbs. Increase exercise as tolerated.       Insomnia    Encouraged good sleep hygiene such as dark, quiet room. No blue/green glowing lights such as computer screens in bedroom. No alcohol or stimulants in evening. Cut down on caffeine as able. Regular  exercise is helpful but not just prior to bed time. Sleeping well on Amitriptyline      Vertigo    No true spinning but she notes feeling woozey and off balance upon arising intermittently. Encouraged to increase hydration as she only drinks about 30 ounces daily. Also encouraged to eat protein every 4-6 hours. She is following with ENT at [facility] and has an MRI of the brain soon, she will report if symptoms worsen.        Other Visit  Diagnoses    Cough       Relevant Orders   CBC with Differential/Platelet   Comprehensive metabolic panel   TSH   DG Chest 2 View   Dizziness       Relevant Orders   Comprehensive metabolic panel   Right-sided chest wall pain       Relevant Orders   CBC with Differential/Platelet   Comprehensive metabolic panel      I am having [place] R. Sosnowski "Torie" maintain her Vitamin D3, Cyanocobalamin (VITAMIN B 12 PO), acetaminophen, loratadine, Ferrous Fumarate-Folic Acid, Biotin, permethrin, rOPINIRole, triamcinolone, letrozole, palbociclib, pantoprazole, traMADol, DULoxetine, lubiprostone, meloxicam, diltiazem, atorvastatin, amitriptyline, and LORazepam.  No orders of the defined types were placed in this encounter.    Penni Homans, MD

## 2020-12-28 NOTE — Assessment & Plan Note (Signed)
No true spinning but she notes feeling woozey and off balance upon arising intermittently. Encouraged to increase hydration as she only drinks about 30 ounces daily. Also encouraged to eat protein every 4-6 hours. She is following with ENT at [facility] and has an MRI of the brain soon, she will report if symptoms worsen.

## 2020-12-28 NOTE — Assessment & Plan Note (Signed)
Tolerating statin, encouraged heart healthy diet, avoid trans fats, minimize simple carbs and saturated fats. Increase exercise as tolerated 

## 2020-12-28 NOTE — Assessment & Plan Note (Signed)
Has had been having increased anxiety but is generally well controlloned on Cymbalta 20 mg daily and Amitriptyline 150 mg daily

## 2020-12-28 NOTE — Assessment & Plan Note (Signed)
Encouraged good sleep hygiene such as dark, quiet room. No blue/green glowing lights such as computer screens in bedroom. No alcohol or stimulants in evening. Cut down on caffeine as able. Regular exercise is helpful but not just prior to bed time. Sleeping well on Amitriptyline

## 2020-12-28 NOTE — Assessment & Plan Note (Signed)
hgba1c acceptable, minimize simple carbs. Increase exercise as tolerated.  

## 2020-12-28 NOTE — Assessment & Plan Note (Signed)
Monitor

## 2020-12-28 NOTE — Assessment & Plan Note (Signed)
She notes some increased congestion and shortness of breath this past week. Check labs and a chest xray. Report worsening info.

## 2020-12-28 NOTE — Assessment & Plan Note (Signed)
Notes some right sided chest pain only last minutes and occurs infrequently. No associated symptoms. Check CXR and report any new concerns or worsening symptoms.

## 2020-12-29 ENCOUNTER — Other Ambulatory Visit (INDEPENDENT_AMBULATORY_CARE_PROVIDER_SITE_OTHER): Payer: Medicare HMO

## 2020-12-29 ENCOUNTER — Other Ambulatory Visit: Payer: Self-pay

## 2020-12-29 ENCOUNTER — Encounter: Payer: Self-pay | Admitting: Family Medicine

## 2020-12-29 ENCOUNTER — Other Ambulatory Visit: Payer: Medicare HMO

## 2020-12-29 DIAGNOSIS — E782 Mixed hyperlipidemia: Secondary | ICD-10-CM | POA: Diagnosis not present

## 2020-12-29 DIAGNOSIS — R42 Dizziness and giddiness: Secondary | ICD-10-CM | POA: Diagnosis not present

## 2020-12-29 DIAGNOSIS — R059 Cough, unspecified: Secondary | ICD-10-CM

## 2020-12-29 DIAGNOSIS — R700 Elevated erythrocyte sedimentation rate: Secondary | ICD-10-CM

## 2020-12-29 DIAGNOSIS — R0789 Other chest pain: Secondary | ICD-10-CM | POA: Diagnosis not present

## 2020-12-30 ENCOUNTER — Other Ambulatory Visit: Payer: Self-pay | Admitting: Family Medicine

## 2020-12-30 ENCOUNTER — Other Ambulatory Visit: Payer: Self-pay | Admitting: Internal Medicine

## 2020-12-30 LAB — TSH: TSH: 4.37 u[IU]/mL (ref 0.35–4.50)

## 2020-12-30 LAB — COMPREHENSIVE METABOLIC PANEL
ALT: 21 U/L (ref 0–35)
AST: 19 U/L (ref 0–37)
Albumin: 4.1 g/dL (ref 3.5–5.2)
Alkaline Phosphatase: 99 U/L (ref 39–117)
BUN: 20 mg/dL (ref 6–23)
CO2: 30 mEq/L (ref 19–32)
Calcium: 9.4 mg/dL (ref 8.4–10.5)
Chloride: 100 mEq/L (ref 96–112)
Creatinine, Ser: 0.8 mg/dL (ref 0.40–1.20)
GFR: 70.63 mL/min (ref >60.00)
Glucose, Bld: 73 mg/dL (ref 70–99)
Potassium: 3.7 mEq/L (ref 3.5–5.1)
Sodium: 138 mEq/L (ref 135–145)
Total Bilirubin: 0.4 mg/dL (ref 0.2–1.2)
Total Protein: 6.9 g/dL (ref 6.0–8.3)

## 2020-12-30 LAB — CBC WITH DIFFERENTIAL/PLATELET
Basophils Absolute: 0.2 10*3/uL — ABNORMAL HIGH (ref 0.0–0.1)
Basophils Relative: 2.7 % (ref 0.0–3.0)
Eosinophils Absolute: 0.1 10*3/uL (ref 0.0–0.7)
Eosinophils Relative: 1.7 % (ref 0.0–5.0)
HCT: 38 % (ref 36.0–46.0)
Hemoglobin: 12.7 g/dL (ref 12.0–15.0)
Lymphocytes Relative: 24.5 % (ref 12.0–46.0)
Lymphs Abs: 2.1 10*3/uL (ref 0.7–4.0)
MCHC: 33.3 g/dL (ref 30.0–36.0)
MCV: 94.6 fl (ref 78.0–100.0)
Monocytes Absolute: 0.7 10*3/uL (ref 0.1–1.0)
Monocytes Relative: 7.6 % (ref 3.0–12.0)
Neutro Abs: 5.5 10*3/uL (ref 1.4–7.7)
Neutrophils Relative %: 63.5 % (ref 43.0–77.0)
Platelets: 194 10*3/uL (ref 150.0–400.0)
RBC: 4.02 Mil/uL (ref 3.87–5.11)
RDW: 14.5 % (ref 11.5–15.5)
WBC: 8.7 10*3/uL (ref 4.0–10.5)

## 2020-12-30 LAB — LIPID PANEL
Cholesterol: 178 mg/dL (ref 0–200)
HDL: 69.7 mg/dL (ref >39.00)
LDL Cholesterol: 81 mg/dL (ref 0–99)
NonHDL: 108.36
Total CHOL/HDL Ratio: 3
Triglycerides: 139 mg/dL (ref 0.0–149.0)
VLDL: 27.8 mg/dL (ref 0.0–40.0)

## 2020-12-30 LAB — SEDIMENTATION RATE: Sed Rate: 18 mm/hr (ref 0–30)

## 2020-12-30 MED ORDER — CEPHALEXIN 500 MG PO CAPS: 500.0000 mg | ORAL_CAPSULE | Freq: Three times a day (TID) | ORAL | 0 refills | Status: DC

## 2021-01-02 DIAGNOSIS — M62838 Other muscle spasm: Secondary | ICD-10-CM | POA: Diagnosis not present

## 2021-01-02 DIAGNOSIS — C792 Secondary malignant neoplasm of skin: Secondary | ICD-10-CM | POA: Diagnosis not present

## 2021-01-02 DIAGNOSIS — Z170 Estrogen receptor positive status [ER+]: Secondary | ICD-10-CM | POA: Diagnosis not present

## 2021-01-02 DIAGNOSIS — R42 Dizziness and giddiness: Secondary | ICD-10-CM | POA: Diagnosis not present

## 2021-01-02 DIAGNOSIS — C50919 Malignant neoplasm of unspecified site of unspecified female breast: Secondary | ICD-10-CM | POA: Diagnosis not present

## 2021-01-02 DIAGNOSIS — R21 Rash and other nonspecific skin eruption: Secondary | ICD-10-CM | POA: Diagnosis not present

## 2021-01-02 DIAGNOSIS — C50911 Malignant neoplasm of unspecified site of right female breast: Secondary | ICD-10-CM | POA: Diagnosis not present

## 2021-01-02 DIAGNOSIS — G5600 Carpal tunnel syndrome, unspecified upper limb: Secondary | ICD-10-CM | POA: Diagnosis not present

## 2021-01-02 DIAGNOSIS — M898X8 Other specified disorders of bone, other site: Secondary | ICD-10-CM | POA: Diagnosis not present

## 2021-01-02 DIAGNOSIS — R531 Weakness: Secondary | ICD-10-CM | POA: Diagnosis not present

## 2021-01-02 DIAGNOSIS — R0602 Shortness of breath: Secondary | ICD-10-CM | POA: Diagnosis not present

## 2021-01-03 DIAGNOSIS — D739 Disease of spleen, unspecified: Secondary | ICD-10-CM | POA: Diagnosis not present

## 2021-01-03 DIAGNOSIS — C792 Secondary malignant neoplasm of skin: Secondary | ICD-10-CM | POA: Diagnosis not present

## 2021-01-03 DIAGNOSIS — R42 Dizziness and giddiness: Secondary | ICD-10-CM | POA: Diagnosis not present

## 2021-01-03 DIAGNOSIS — R0602 Shortness of breath: Secondary | ICD-10-CM | POA: Diagnosis not present

## 2021-01-03 DIAGNOSIS — G893 Neoplasm related pain (acute) (chronic): Secondary | ICD-10-CM | POA: Diagnosis not present

## 2021-01-03 DIAGNOSIS — C50911 Malignant neoplasm of unspecified site of right female breast: Secondary | ICD-10-CM | POA: Diagnosis not present

## 2021-01-03 DIAGNOSIS — D7389 Other diseases of spleen: Secondary | ICD-10-CM | POA: Diagnosis not present

## 2021-01-03 DIAGNOSIS — R7989 Other specified abnormal findings of blood chemistry: Secondary | ICD-10-CM | POA: Diagnosis not present

## 2021-01-03 DIAGNOSIS — Z170 Estrogen receptor positive status [ER+]: Secondary | ICD-10-CM | POA: Diagnosis not present

## 2021-01-03 DIAGNOSIS — M8580 Other specified disorders of bone density and structure, unspecified site: Secondary | ICD-10-CM | POA: Diagnosis not present

## 2021-01-03 DIAGNOSIS — C7951 Secondary malignant neoplasm of bone: Secondary | ICD-10-CM | POA: Diagnosis not present

## 2021-01-05 ENCOUNTER — Other Ambulatory Visit: Payer: Self-pay

## 2021-01-05 ENCOUNTER — Other Ambulatory Visit: Payer: Self-pay | Admitting: Internal Medicine

## 2021-01-05 ENCOUNTER — Encounter: Payer: Self-pay | Admitting: Family Medicine

## 2021-01-05 MED ORDER — ATORVASTATIN CALCIUM 20 MG PO TABS: ORAL_TABLET | ORAL | 0 refills | Status: DC

## 2021-01-24 ENCOUNTER — Ambulatory Visit (INDEPENDENT_AMBULATORY_CARE_PROVIDER_SITE_OTHER): Payer: PPO | Admitting: Sports Medicine

## 2021-01-24 ENCOUNTER — Other Ambulatory Visit: Payer: Self-pay

## 2021-01-24 DIAGNOSIS — M160 Bilateral primary osteoarthritis of hip: Secondary | ICD-10-CM | POA: Diagnosis not present

## 2021-01-24 NOTE — Progress Notes (Signed)
    Procedures performed today:    None.  Independent interpretation of notes and tests performed by another provider:   X-rays personally reviewed, she does have severe bilateral hip osteoarthritis.  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 I am going to give her some gentle home rehab exercises, continue oral medications as needed, and she can call me for refills. She is interested in acupuncture which I think is okay. She is active and mobile and does not feel the need for arthroplasty just yet.    ___________________________________________ Gwen Her. Dianah Field, M.D., ABFM., CAQSM. Primary Care and [place] Instructor of [place] [facility]

## 2021-01-24 NOTE — Assessment & Plan Note (Addendum)
Bilateral severe hip osteoarthritis, injected right hip back in January, she is doing okay, has a little bit of discomfort and feels like physical therapy was a bit too much. I am going to give her some gentle home rehab exercises, continue oral medications as needed, and she can call me for refills. She is interested in acupuncture which I think is okay. She is active and mobile and does not feel the need for arthroplasty just yet.

## 2021-01-26 NOTE — Progress Notes (Unsigned)
Cardiology Office Note   Date:  01/27/2021   ID:  Anne, [name] Jan 28, 1941, MRN [id number]  PCP:  Anne Lukes, MD  Cardiologist:  Dr.Ross  CC: Follow Up CAD    History of Present Illness: Anne [name] is a 80 y.o. female who presents for we are following for ongoing assessment and management of orthostatic intolerance, carotid stenosis, hyperlipidemia, with most recent echocardiogram revealing mild LVH with EF 55 to 60% with grade 1 diastolic dysfunction.  Event monitor and March 2014 revealed normal sinus rhythm with extensive PVCs.  Myoview in April 2014 was low risk without scar or ischemia.  Other history includes COPD, anemia, GERD, breast cancer stage IV, eosinophilic esophagitis, bilateral severe hip osteoarthritis..  Was last seen by Dr. Harrington Challenger on 09/25/2019 at which time she was asymptomatic concerning cardiac symptoms.  She comes today stating that she has been diagnosed with stage IV breast cancer and is being followed at [facility].  Apparently this has occurred in the past where she had had lumpectomies radiation with chemotherapy in 2008.  Had reconstruction surgery.  She began a rash on her skin around her breast area and was diagnosed by mammogram once the rash persisted despite medical management.  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  From a cardiac standpoint she is doing very well.  She denies any chest pain, she does have some mild fatigue, but no significant dyspnea on exertion.     Past Medical History:  Diagnosis Date  . ALKALINE PHOSPHATASE, ELEVATED 03/15/2009  . Allergic state 06/10/2012  . Allergy   . Anemia   . Anxiety and depression 04/28/2011  . Arthritis   . Asthma   . Atypical chest pain 11/30/2011  . AVM (arteriovenous malformation) of colon  2011   cecum  . Baker's cyst of knee 05/22/2011  . Cancer ([place]) 01,  08   XRT/chemo 01-02/ lobular invasive ca  . Carotid artery disease ([place])    a. Carotid duplex 03/2014: stable 1-39% BICA, f/u due 03/2016.  [name] Chronic alcoholism in remission ([place]) 03/29/2011   Did not tolerate Klonopin, caused some confusion and bad dreams.    . Clotting disorder ([place])   . COPD (chronic obstructive pulmonary disease) ([place])   . Dermatitis 11/23/2012  . EE (eosinophilic esophagitis)   . Emphysema of lung ([place])   . Esophageal ring   . ESOPHAGEAL STRICTURE 03/29/2009  . Fall 11/23/2012  . Family history of breast cancer   . Family history of colon cancer   . Family history of ovarian cancer   . Family history of pancreatic cancer   . Folliculitis of nose 3/78/5885  . GERD (gastroesophageal reflux disease) 09/29/2009   improved s/p cholecystectomy and esophagus dilatation  . History of chicken pox   . History of measles   . History of shingles    2 episodes  . Hx of echocardiogram    a. Echo 01/2013: mild LVH, EF 55-60%, normal wall motion, Gr 1 diast dysfn  . Hyperlipidemia   . Knee pain, bilateral 07/23/2011  . Medicare annual wellness visit, subsequent 06/19/2015  . Mixed hyperlipidemia 10/17/2010   Qualifier: Diagnosis of  By: Mack Guise    . Orthostasis   . Osteopenia 03/14/2011  . Osteoporosis   . Personal history of  chemotherapy 2001  . Personal history of radiation therapy 2001   rt breast  . 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 PVC's (premature ventricular contractions)    a. Event monitor 01/2013: NSR, extensive PVCs.  . Radial neck fracture 10/2011   minimally displaced  . Substance abuse ([place])    In remission 3 years.   . Urinary incontinence 03/19/2012  . Vaginitis 05/22/2011    Past Surgical History:  Procedure Laterality Date  . APPENDECTOMY    . AUGMENTATION MAMMAPLASTY Right 05/27/2007  . BREAST BIOPSY Right 01/02/2007   wire loc  . BREAST BIOPSY  12/26/2006  . BREAST  LUMPECTOMY Right 2001  . BREAST RECONSTRUCTION  2008, 2009, 2010  . BREAST REDUCTION WITH MASTOPEXY Left 05/30/2017   Procedure: LEFT BREAST REDUCTION FOR SYMTERY WITH MASTOPEXY;  Surgeon: Wallace Going, DO;  Location: [place];  Service: Plastics;  Laterality: Left;  . CHOLECYSTECTOMY  2010  . COLONOSCOPY  09/05/10   cecal avm's  . DENTAL SURGERY  05/2016   4 dental implants by Dr. Loyal Gambler.  [name] ERCP  2010    CBD stone extraction   . ESOPHAGOGASTRODUODENOSCOPY  01/08/2012   Procedure: ESOPHAGOGASTRODUODENOSCOPY (EGD);  Surgeon: Gatha Mayer, MD;  Location: [facility];  Service: Endoscopy;  Laterality: N/A;  . ESOPHAGOGASTRODUODENOSCOPY (EGD) WITH ESOPHAGEAL DILATION  2010, 2012  . LAPAROSCOPIC APPENDECTOMY N/A 08/04/2016   Procedure: APPENDECTOMY LAPAROSCOPIC;  Surgeon: Michael Boston, MD;  Location: [facility];  Service: General;  Laterality: N/A;  . LIPOSUCTION WITH LIPOFILLING Left 11/21/2017   Procedure: LIPOSUCTION FROM ABDOMEN WITH LIPOFILLING TO LEFT BREAST;  Surgeon: Wallace Going, DO;  Location: [facility];  Service: Plastics;  Laterality: Left;  [name] MASTECTOMY MODIFIED RADICAL Right 05/27/2007   , Mastectomy modified radical (08), breast reconstruction, CA lesions excised lateral abd wall 2010  . MASTOPEXY Left 11/21/2017   Procedure: LEFT BREAST REVISION MASTOPEXY FOR SYMMETRY;  Surgeon: Wallace Going, DO;  Location: [place];  Service: Plastics;  Laterality: Left;  . REDUCTION MAMMAPLASTY Left   . SAVORY DILATION  01/08/2012   Procedure: SAVORY DILATION;  Surgeon: Gatha Mayer, MD;  Location: [facility];  Service: Endoscopy;  Laterality: N/A;  need xray     Current Outpatient Medications  Medication Sig Dispense Refill  . acetaminophen (TYLENOL) 500 MG tablet Take 500-1,000 mg by mouth every 6 (six) hours as needed (for pain.).    [name] amitriptyline (ELAVIL) 25 MG tablet Take 6 tablets (150 mg total) by mouth at  bedtime. 180 tablet 1  . aspirin 81 MG EC tablet Take 81 mg by mouth daily.    . Biotin 5 MG CAPS Take by mouth.    . Cholecalciferol (VITAMIN D3) 1000 UNITS CAPS Take 2,000 Units by mouth daily.    . Cyanocobalamin (VITAMIN B 12 PO) Take 1 tablet by mouth daily.    . DULoxetine (CYMBALTA) 20 MG capsule Take 1 capsule (20 mg total) by mouth daily. 30 capsule 1  . Ferrous Fumarate-Folic Acid 702-6 MG TABS Take 1 tablet by mouth daily. 30 each 3  . letrozole (FEMARA) 2.5 MG tablet Take 1 tablet (2.5 mg total) by mouth daily. 90 tablet 3  . loratadine (CLARITIN) 10 MG tablet Take 1 tablet (10 mg total) by mouth daily. 30 tablet 11  . LORazepam (ATIVAN) 1 MG tablet TAKE 1 TABLET(1 MG) BY MOUTH EVERY 8 HOURS AS NEEDED FOR ANXIETY 70 tablet 0  . meclizine (ANTIVERT) 12.5  MG tablet Take 1 tablet by mouth as needed.    . meloxicam (MOBIC) 15 MG tablet One tab PO qAM with a meal for 2 weeks, then daily prn pain. 30 tablet 3  . palbociclib (IBRANCE) 125 MG tablet Take 1 tablet (125 mg total) by mouth daily. Take for 21 days on, 7 days off, repeat every 28 days. 21 tablet 0  . pantoprazole (PROTONIX) 40 MG tablet TAKE 1 TABLET(40 MG) BY MOUTH DAILY 90 tablet 1  . permethrin (ELIMITE) 5 % cream Apply from scalp to toes, avoid face. Leave on 8-14 hours. Thoroughly rinse with soap and water. Repeat in 2 weeks if needed. 60 g 1  . rOPINIRole (REQUIP) 0.25 MG tablet Take 1 tablet (0.25 mg total) by mouth at bedtime. 30 tablet 0  . traMADol (ULTRAM) 50 MG tablet Take 0.5-1 tablets (25-50 mg total) by mouth every 8 (eight) hours as needed. 20 tablet 0  . triamcinolone cream (KENALOG) 0.1 % Apply topically 2 (two) times daily. Apply to affected area 45 g 1  . atorvastatin (LIPITOR) 20 MG tablet Take 1 tablet by mouth in the morning and at bedtime. 90 tablet 3  . diltiazem (CARDIZEM) 30 MG tablet TAKE 1 TABLET BY MOUTH DAILY FOR PALPITATIONS 90 tablet 3   No current facility-administered medications for this visit.     Allergies:   Codeine, Morphine, Peanut-containing drug products, Sorbitol, Tomato, Zofran, Advil [ibuprofen], Diphenhydramine hcl, and Oxycodone    Social History:  The patient  reports that she quit smoking about 13 years ago. Her smoking use included cigarettes. She has a 50.00 pack-year smoking history. She has never used smokeless tobacco. She reports previous alcohol use. She reports that she does not use drugs.   Family History:  The patient's family history includes Alcohol abuse in her maternal grandfather; Anxiety disorder in her sister; Arthritis in her sister; Breast cancer in her cousin, cousin, maternal aunt, maternal aunt, maternal aunt, and paternal aunt; Cirrhosis in her sister; Colon cancer in her maternal uncle; Esophageal cancer in her paternal grandfather; Heart disease in her father and paternal grandfather; Lung cancer in her father and paternal aunt; Osteoporosis in her sister and sister; Other in her mother; Ovarian cancer (age of onset: 22) in her maternal aunt; Pancreatic cancer in her maternal aunt and maternal uncle; Rectal cancer in her paternal grandfather; Skin cancer in her sister; Stomach cancer in her maternal uncle; Stroke in her maternal grandmother.    ROS: All other systems are reviewed and negative. Unless otherwise mentioned in H&P    PHYSICAL EXAM: VS:  BP 100/80   Pulse 88   Ht 5' 5.5" (1.664 m)   Wt 144 lb 9.6 oz (65.6 kg)   SpO2 95%   BMI 23.70 kg/m  , BMI Body mass index is 23.7 kg/m. GEN: Well nourished, well developed, in no acute distress HEENT: normal Neck: no JVD, carotid bruits, or masses Cardiac: RRR; no murmurs, rubs, or gallops,no edema  Respiratory:  Clear to auscultation bilaterally, normal work of breathing GI: soft, nontender, nondistended, + BS MS: no deformity or atrophy Skin: warm and dry, rash noted on chest area, and in between breasts. Neuro:  Strength and sensation are intact Psych: euthymic mood, full  affect   EKG: Sinus rhythm with nonspecific ST-T wave abnormality, unchanged from prior EKG on 11/24/2018.  Heart rate of 88 bpm  Recent Labs: 02/23/2020: Magnesium 1.9 12/29/2020: ALT 21; BUN 20; Creatinine, Ser 0.80; Hemoglobin 12.7; Platelets 194.0; Potassium 3.7;  Sodium 138; TSH 4.37    Lipid Panel    Component Value Date/Time   CHOL 178 12/29/2020 1440   TRIG 139.0 12/29/2020 1440   HDL 69.70 12/29/2020 1440   CHOLHDL 3 12/29/2020 1440   VLDL 27.8 12/29/2020 1440   LDLCALC 81 12/29/2020 1440   LDLDIRECT 117.4 01/13/2013 1210      Wt Readings from Last 3 Encounters:  01/27/21 144 lb 9.6 oz (65.6 kg)  12/27/20 143 lb 12.8 oz (65.2 kg)  09/04/20 142 lb (64.4 kg)      Other studies Reviewed: Echocardiogram 09-Jul-2020 1. Left ventricular ejection fraction, by estimation, is 60 to 65%. The  left ventricle has normal function. The left ventricle has no regional  wall motion abnormalities. Left ventricular diastolic parameters are  consistent with Grade I diastolic  dysfunction (impaired relaxation). The average left ventricular global  longitudinal strain is -19.8 %. The global longitudinal strain is normal.  2. Right ventricular systolic function is normal. The right ventricular  size is normal.  3. The mitral valve is normal in structure. Trivial mitral valve  regurgitation. No evidence of mitral stenosis.  4. The aortic valve is normal in structure. Aortic valve regurgitation is  not visualized. No aortic stenosis is present.  5. The inferior vena cava is normal in size with greater than 50%  respiratory variability, suggesting right atrial pressure of 3 mmHg.    ASSESSMENT AND PLAN:  1.  Palpitations: Continue to use diltiazem as needed.  Has not had any further complaints of this.  2.  Hyperlipidemia, continue statin therapy as directed.  She is having multiple lab draws during her multiple treatments.  3.  Stage IV breast cancer.  Being [facility] at [facility].  Treated with p.o. chemo.   Current medicines are reviewed at length with the patient today.  I have spent 25 minutes dedicated to the care of this patient on the date of this encounter to include pre-visit review of records, assessment, management and diagnostic testing,with shared decision making.  Labs/ tests ordered today include: None  Phill Myron. West Pugh, ANP, AACC   01/27/2021 4:31 PM    [facility] [address] Office [phone] Fax [phone]  Notice: This dictation was prepared with Dragon dictation along with smaller phrase technology. Any transcriptional errors that result from this process are unintentional and may not be corrected upon review.

## 2021-01-27 ENCOUNTER — Encounter: Payer: Self-pay | Admitting: Adult Health

## 2021-01-27 ENCOUNTER — Ambulatory Visit: Payer: PPO | Admitting: Adult Health

## 2021-01-27 ENCOUNTER — Other Ambulatory Visit: Payer: Self-pay

## 2021-01-27 VITALS — BP 100/80 | HR 88 | Ht 65.5 in | Wt 144.6 lb

## 2021-01-27 DIAGNOSIS — C50911 Malignant neoplasm of unspecified site of right female breast: Secondary | ICD-10-CM | POA: Diagnosis not present

## 2021-01-27 DIAGNOSIS — C792 Secondary malignant neoplasm of skin: Secondary | ICD-10-CM | POA: Diagnosis not present

## 2021-01-27 DIAGNOSIS — R002 Palpitations: Secondary | ICD-10-CM | POA: Diagnosis not present

## 2021-01-27 DIAGNOSIS — E782 Mixed hyperlipidemia: Secondary | ICD-10-CM

## 2021-01-27 DIAGNOSIS — Z853 Personal history of malignant neoplasm of breast: Secondary | ICD-10-CM | POA: Diagnosis not present

## 2021-01-27 MED ORDER — ATORVASTATIN CALCIUM 20 MG PO TABS: ORAL_TABLET | ORAL | 3 refills | Status: DC

## 2021-01-27 MED ORDER — DILTIAZEM HCL 30 MG PO TABS: ORAL_TABLET | ORAL | 3 refills | Status: DC

## 2021-01-27 NOTE — Patient Instructions (Signed)
Medication Instructions:  Your physician recommends that you continue on your current medications as directed. Please refer to the Current Medication list given to you today.  *If you need a refill on your cardiac medications before your next appointment, please call your pharmacy*   Lab Work: None ordered  If you have labs (blood work) drawn today and your tests are completely normal, you will receive your results only by: [name] MyChart Message (if you have MyChart) OR . A paper copy in the mail If you have any lab test that is abnormal or we need to change your treatment, we will call you to review the results.   Testing/Procedures: None ordered   Follow-Up: At [facility], you and your health needs are our priority.  As part of our continuing mission to provide you with exceptional heart care, we have created designated Provider Care Teams.  These Care Teams include your primary Cardiologist (physician) and Advanced Practice Providers (APPs -  Physician Assistants and Nurse Practitioners) who all work together to provide you with the care you need, when you need it.  We recommend signing up for the patient portal called "MyChart".  Sign up information is provided on this After Visit Summary.  MyChart is used to connect with patients for Virtual Visits (Telemedicine).  Patients are able to view lab/test results, encounter notes, upcoming appointments, etc.  Non-urgent messages can be sent to your provider as well.   To learn more about what you can do with MyChart, go to [email or web address].    Your next appointment:   6 month(s)  The format for your next appointment:   In Person  Provider:   Dorris Carnes, MD   Other Instructions

## 2021-01-31 ENCOUNTER — Emergency Department
Admission: EM | Admit: 2021-01-31 | Discharge: 2021-01-31 | Disposition: A | Discharge status: Home / Self Care | Payer: PPO | Source: Home / Self Care | Attending: Family Medicine | Admitting: Family Medicine

## 2021-01-31 ENCOUNTER — Other Ambulatory Visit: Payer: Self-pay

## 2021-01-31 DIAGNOSIS — M545 Low back pain, unspecified: Secondary | ICD-10-CM | POA: Diagnosis not present

## 2021-01-31 NOTE — ED Provider Notes (Signed)
[name]    CSN: [id number] Arrival date & time: 01/31/21  1401      History   Chief Complaint Chief Complaint  Patient presents with  . Back Pain    HPI Anne [name] is a 80 y.o. female.   HPI  Patient states that she reached down to pick up her gardening basket full of tools, and as she stood erect she felt a pulling pain in her left low back.  It has persisted..  She has tried ice on it.  She is here hoping to get an injection into the area.  She states this is always worked for her before.  She is called her sports medicine doctors for injection in her hip, and has come here to the urgent care for injection into her trochanteric bursa.  She points to her left SI region as the area of maximum pain.  No radiation.  No pain into hips or legs. She has metastatic cancer.  She knows that she does have a bone lesion.  She called her cancer doctor today and cancer doctor told her this was not her cancer.   Past Medical History:  Diagnosis Date  . ALKALINE PHOSPHATASE, ELEVATED 03/15/2009  . Allergic state 06/10/2012  . Allergy   . Anemia   . Anxiety and depression 04/28/2011  . Arthritis   . Asthma   . Atypical chest pain 11/30/2011  . 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 Baker's cyst of knee 05/22/2011  . Cancer ([place]) 01,  08   XRT/chemo 01-02/ lobular invasive ca  . Carotid artery disease ([place])    a. Carotid duplex 03/2014: stable 1-39% BICA, f/u due 03/2016.  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    . Clotting disorder ([place])   . COPD (chronic obstructive pulmonary disease) ([place])   . Dermatitis 11/23/2012  . EE (eosinophilic esophagitis)   . Emphysema of lung ([place])   . Esophageal ring   . ESOPHAGEAL STRICTURE 03/29/2009  . Fall 11/23/2012  . Family history of breast cancer   . Family history of colon cancer   . Family history of ovarian cancer   . Family  history of pancreatic cancer   . Folliculitis of nose 7/49/4496  . GERD (gastroesophageal reflux disease) 09/29/2009   improved s/p cholecystectomy and esophagus dilatation  . History of chicken pox   . History of measles   . History of shingles    2 episodes  . Hx of echocardiogram    a. Echo 01/2013: mild LVH, EF 55-60%, normal wall motion, Gr 1 diast dysfn  . Hyperlipidemia   . Knee pain, bilateral 07/23/2011  . Medicare annual wellness visit, subsequent 06/19/2015  . Mixed hyperlipidemia 10/17/2010   Qualifier: Diagnosis of  By: Mack Guise    . Orthostasis   . Osteopenia 03/14/2011  . Osteoporosis   . Personal history of chemotherapy 2001  . Personal history of radiation therapy 2001   rt breast  . 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 PVC's (premature ventricular contractions)    a. Event monitor 01/2013: NSR, extensive PVCs.  . Radial neck fracture 10/2011   minimally displaced  . Substance abuse ([place])    In remission 3 years.   . Urinary incontinence 03/19/2012  . Vaginitis 05/22/2011    Patient Active Problem List   Diagnosis Date Noted  . Insomnia 12/28/2020  . Vertigo  12/28/2020  . Emphysema lung ([place]) 06/06/2020  . RLS (restless legs syndrome) 02/24/2020  . 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 Facial trauma, sequela 08/09/2019  . Hyperglycemia 08/19/2018  . Mild intermittent asthma without complication 33/00/7622  . Asthma 04/30/2018  . 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 Palpitations 04/30/2018  . Skin lesion of face 02/07/2018  . Low back pain 09/23/2017  . Hypokalemia 08/11/2017  . Abdominal pain 08/11/2017  . SBO (small bowel obstruction) ([place]) 08/10/2017  . History of right breast cancer 03/21/2017  . Genetic testing 09/13/2016  . Family history of breast cancer   . Family history of pancreatic cancer   . Family history of colon cancer   . Pain in the chest 05/16/2016  . Status post right breast reconstruction 05/11/2016  .  Breast cancer 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 ([place]) 05/11/2016  . History of breast cancer in female 05/11/2016  . Osteopenia determined by x-ray 05/11/2016  . IBS (irritable bowel syndrome) 03/09/2016  . Dyspnea 06/19/2015  . Medicare annual wellness visit, subsequent 06/19/2015  . Urinary frequency 12/12/2014  . Breast cancer, right breast ([place]) 10/18/2014  . Superficial thrombophlebitis 03/16/2014  . Plant dermatitis 03/16/2014  . Chest wall pain 02/07/2014  . Primary osteoarthritis of both hips 01/21/2014  . Elevated sed rate 08/02/2013  . Dermatitis 11/23/2012  . Falls 11/23/2012  . Preventative health care 10/21/2012  . Allergy 06/10/2012  . Urinary incontinence 03/19/2012  . Anemia 12/21/2011  . Knee pain, bilateral 07/23/2011  . Baker's cyst of knee 05/22/2011  . Eosinophilic esophagitis 63/33/5456  . Anxiety and depression 04/28/2011  . 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 Constipation, chronic 03/15/2011  . Mixed hyperlipidemia 10/17/2010  . CAROTID ARTERY STENOSIS 01/13/2010  . GERD 09/29/2009  . PERSONAL HX BREAST CANCER 09/29/2009    Past Surgical History:  Procedure Laterality Date  . APPENDECTOMY    . AUGMENTATION MAMMAPLASTY Right 05/27/2007  . BREAST BIOPSY Right 01/02/2007   wire loc  . BREAST BIOPSY  12/26/2006  . BREAST LUMPECTOMY Right 2001  . BREAST RECONSTRUCTION  2008, 2009, 2010  . BREAST REDUCTION WITH MASTOPEXY Left 05/30/2017   Procedure: LEFT BREAST REDUCTION FOR SYMTERY WITH MASTOPEXY;  Surgeon: Wallace Going, DO;  Location: [facility];  Service: Plastics;  Laterality: Left;  . CHOLECYSTECTOMY  2010  . COLONOSCOPY  09/05/10   cecal avm's  . DENTAL SURGERY  05/2016   4 dental implants by Dr. Loyal Gambler.  [name] ERCP  2010    CBD stone extraction   . ESOPHAGOGASTRODUODENOSCOPY  01/08/2012   Procedure: ESOPHAGOGASTRODUODENOSCOPY (EGD);  Surgeon: Gatha Mayer, MD;  [facility];  Service: Endoscopy;  Laterality:  N/A;  . ESOPHAGOGASTRODUODENOSCOPY (EGD) WITH ESOPHAGEAL DILATION  2010, 2012  . LAPAROSCOPIC APPENDECTOMY N/A 08/04/2016   Procedure: APPENDECTOMY LAPAROSCOPIC;  Surgeon: Michael Boston, MD;  Location: [facility];  Service: General;  Laterality: N/A;  . LIPOSUCTION WITH LIPOFILLING Left 11/21/2017   Procedure: LIPOSUCTION FROM ABDOMEN WITH LIPOFILLING TO LEFT BREAST;  Surgeon: Wallace Going, DO;  Location: [facility];  Service: Plastics;  Laterality: Left;  [name] MASTECTOMY MODIFIED RADICAL Right 05/27/2007   , Mastectomy modified radical (08), breast reconstruction, CA lesions excised lateral abd wall 2010  . MASTOPEXY Left 11/21/2017   Procedure: LEFT BREAST REVISION MASTOPEXY FOR SYMMETRY;  Surgeon: Wallace Going, DO;  Location: [facility];  Service: Plastics;  Laterality: Left;  . REDUCTION MAMMAPLASTY Left   . SAVORY DILATION  01/08/2012   Procedure: SAVORY DILATION;  Surgeon: Gatha Mayer, MD;  Location: [facility];  Service: Endoscopy;  Laterality: N/A;  need xray    OB History   No obstetric history on file.      Home Medications    Prior to Admission medications   Medication Sig Start Date End Date Taking? Authorizing Provider  acetaminophen (TYLENOL) 500 MG tablet Take 500-1,000 mg by mouth every 6 (six) hours as needed (for pain.).    [provider]  amitriptyline (ELAVIL) 25 MG tablet Take 6 tablets (150 mg total) by mouth at bedtime. 12/15/20   [name], MD  aspirin 81 MG EC tablet Take 81 mg by mouth daily.    [provider]  atorvastatin (LIPITOR) 20 MG tablet Take 1 tablet by mouth in the morning and at bedtime. 01/27/21   Lendon Colonel, NP  Biotin 5 MG CAPS Take by mouth.    [provider]  Cholecalciferol (VITAMIN D3) 1000 UNITS CAPS Take 2,000 Units by mouth daily.    [provider]  Cyanocobalamin (VITAMIN B 12 PO) Take 1 tablet by mouth daily.    [provider]   diltiazem (CARDIZEM) 30 MG tablet TAKE 1 TABLET BY MOUTH DAILY FOR PALPITATIONS 01/27/21   Lendon Colonel, NP  DULoxetine (CYMBALTA) 20 MG capsule Take 1 capsule (20 mg total) by mouth daily. 08/01/20   Truitt Merle, MD  Ferrous Fumarate-Folic Acid 341-9 MG TABS Take 1 tablet by mouth daily. 09/03/18   [name], MD  letrozole [facility]) 2.5 MG tablet Take 1 tablet (2.5 mg total) by mouth daily. 06/16/20   Alla Feeling, NP  loratadine (CLARITIN) 10 MG tablet Take 1 tablet (10 mg total) by mouth daily. 04/29/18   [name], MD  LORazepam (ATIVAN) 1 MG tablet TAKE 1 TABLET(1 MG) BY MOUTH EVERY 8 HOURS AS NEEDED FOR ANXIETY 12/20/20   [name], MD  meclizine (ANTIVERT) 12.5 MG tablet Take 1 tablet by mouth as needed. 11/23/20   [provider]  meloxicam (MOBIC) 15 MG tablet One tab PO qAM with a meal for 2 weeks, then daily prn pain. 11/21/20   Silverio Decamp, MD  palbociclib (IBRANCE) 125 MG tablet Take 1 tablet (125 mg total) by mouth daily. Take for 21 days on, 7 days off, repeat every 28 days. 06/19/20   Truitt Merle, MD  pantoprazole (PROTONIX) 40 MG tablet TAKE 1 TABLET(40 MG) BY MOUTH DAILY 07/04/20   [name], MD  rOPINIRole (REQUIP) 0.25 MG tablet Take 1 tablet (0.25 mg total) by mouth at bedtime. 03/06/20   [name], MD  traMADol (ULTRAM) 50 MG tablet Take 0.5-1 tablets (25-50 mg total) by mouth every 8 (eight) hours as needed. 08/01/20   Truitt Merle, MD  triamcinolone cream (KENALOG) 0.1 % Apply topically 2 (two) times daily. Apply to affected area 05/25/20   Alla Feeling, NP    Family History Family History  Problem Relation Age of Onset  . Heart disease Father   . Lung cancer Father        smoker  . Cirrhosis Sister        Primary Biliary  . Stroke Maternal Grandmother   . Alcohol abuse Maternal Grandfather   . Heart disease Paternal Grandfather   . Esophageal cancer Paternal Grandfather   . Rectal cancer Paternal Grandfather   . Anxiety  disorder Sister   . Osteoporosis Sister   .  Arthritis Sister        Rheumatoid  . Osteoporosis Sister   . Skin cancer Sister        multiple skin cancers, over 48 excisions.  . Other Mother        tic [place]  . Breast cancer Maternal Aunt        dx in her 7s  . Breast cancer Paternal Aunt        dx in her 71s  . Pancreatic cancer Maternal Uncle        dx in his 23s; smoker  . Breast cancer Maternal Aunt        dx in her 52s  . Breast cancer Maternal Aunt        possible breast cancer dx and died in her 65s  . Ovarian cancer Maternal Aunt 29  . Pancreatic cancer Maternal Aunt        dx in her 84s  . Stomach cancer Maternal Uncle   . Colon cancer Maternal Uncle   . Lung cancer Paternal Aunt   . Breast cancer Cousin        paternal first cousin  . Breast cancer Cousin        maternal first cousin  . Anesthesia problems Neg Hx   . Hypotension Neg Hx   . Malignant hyperthermia Neg Hx   . Pseudochol deficiency Neg Hx     Social History Social History   Tobacco Use  . Smoking status: Former Smoker    Packs/day: 1.00    Years: 50.00    Pack years: 50.00    Types: Cigarettes    Quit date: 05/22/2007    Years since quitting: 13.7  . Smokeless tobacco: Never Used  Vaping Use  . Vaping Use: Never used  Substance Use Topics  . Alcohol use: Not Currently  . Drug use: No     Allergies   Codeine, Morphine, Peanut-containing drug products, Sorbitol, Tomato, Zofran, Advil [ibuprofen], Diphenhydramine hcl, and Oxycodone   Review of Systems Review of Systems See HPI  Physical Exam Triage Vital Signs ED Triage Vitals  Enc Vitals Group     BP 01/31/21 1407 126/74     Pulse Rate 01/31/21 1407 98     Resp 01/31/21 1407 18     Temp 01/31/21 1407 98.1 F (36.7 C)     Temp Source 01/31/21 1407 Oral     SpO2 01/31/21 1407 95 %     Weight --      Height --      Head Circumference --      Peak Flow --      Pain Score 01/31/21 1410 0     Pain Loc --      Pain Edu?  --      Excl. in [place]? --    No data found.  Updated Vital Signs BP 126/74 (BP Location: Left Arm)   Pulse 98   Temp 98.1 F (36.7 C) (Oral)   Resp 18   SpO2 95%       Physical Exam Constitutional:      General: She is not in acute distress.    Appearance: She is well-developed.     Comments: Somewhat frail-appearing  HENT:     Head: Normocephalic and atraumatic.     Mouth/Throat:     Comments: Mask in place Eyes:     Conjunctiva/sclera: Conjunctivae normal.     Pupils: Pupils are equal, round, and reactive to light.  Cardiovascular:  Rate and Rhythm: Normal rate.  Pulmonary:     Effort: Pulmonary effort is normal. No respiratory distress.  Abdominal:     General: There is no distension.     Palpations: Abdomen is soft.  Musculoskeletal:        General: Normal range of motion.     Cervical back: Normal range of motion.  Skin:    General: Skin is warm and dry.  Neurological:     Mental Status: She is alert.     Gait: Gait normal.  Psychiatric:        Behavior: Behavior normal.   Tenderness is isolated over the left SI joint.  There is a palpable subcutaneous swelling that is mobile (mouse).  This is tender.  With patient's permission she is identified.  The area of maximum tenderness is marked.  Skin is cleansed with alcohol.  I injected 80 mg of Depo-Medrol with 1 cc of 1% lidocaine in and around the trigger point. Lot number [id number] expires 08/2021   UC Treatments / Results  Labs (all labs ordered are listed, but only abnormal results are displayed) Labs Reviewed - No data to display  EKG   Radiology No results found.  Procedures Procedures (including critical care time)  Medications Ordered in UC Medications - No data to display  Initial Impression / Assessment and Plan / UC Course  I have reviewed the triage vital signs and the nursing notes.  Pertinent labs & imaging results that were available during my care of the patient were reviewed by  me and considered in my medical decision making (see chart for details).     Postinjection care discussed Final Clinical Impressions(s) / UC Diagnoses   Final diagnoses:  Acute right-sided low back pain without sciatica     Discharge Instructions     Put ice on the low back area for 20 min when you get home and again later tonight No heat to area after injection Activity as tolerated   ED Prescriptions    None     PDMP not reviewed this encounter.   Raylene Everts, MD 01/31/21 [phone]

## 2021-01-31 NOTE — Discharge Instructions (Addendum)
Put ice on the low back area for 20 min when you get home and again later tonight No heat to area after injection Activity as tolerated

## 2021-01-31 NOTE — ED Triage Notes (Signed)
Pt c/o back pain since yesterday when she bent over to pick up a basket out her garden when she felt a sharp stabbing pain in her lower back. Pain intermittent depending on movement. Tylenol and heat prn.

## 2021-02-03 ENCOUNTER — Other Ambulatory Visit (HOSPITAL_COMMUNITY): Payer: Self-pay

## 2021-02-03 ENCOUNTER — Other Ambulatory Visit: Payer: Self-pay | Admitting: Family Medicine

## 2021-02-04 ENCOUNTER — Other Ambulatory Visit: Payer: Self-pay | Admitting: Family Medicine

## 2021-02-04 DIAGNOSIS — S299XXA Unspecified injury of thorax, initial encounter: Secondary | ICD-10-CM

## 2021-02-06 DIAGNOSIS — R519 Headache, unspecified: Secondary | ICD-10-CM | POA: Diagnosis not present

## 2021-02-06 DIAGNOSIS — R21 Rash and other nonspecific skin eruption: Secondary | ICD-10-CM | POA: Diagnosis not present

## 2021-02-06 DIAGNOSIS — C50911 Malignant neoplasm of unspecified site of right female breast: Secondary | ICD-10-CM | POA: Diagnosis not present

## 2021-02-06 DIAGNOSIS — C7889 Secondary malignant neoplasm of other digestive organs: Secondary | ICD-10-CM | POA: Diagnosis not present

## 2021-02-06 DIAGNOSIS — Z170 Estrogen receptor positive status [ER+]: Secondary | ICD-10-CM | POA: Diagnosis not present

## 2021-02-06 DIAGNOSIS — M8580 Other specified disorders of bone density and structure, unspecified site: Secondary | ICD-10-CM | POA: Diagnosis not present

## 2021-02-06 DIAGNOSIS — Z923 Personal history of irradiation: Secondary | ICD-10-CM | POA: Diagnosis not present

## 2021-02-06 DIAGNOSIS — G893 Neoplasm related pain (acute) (chronic): Secondary | ICD-10-CM | POA: Diagnosis not present

## 2021-02-06 DIAGNOSIS — M549 Dorsalgia, unspecified: Secondary | ICD-10-CM | POA: Diagnosis not present

## 2021-02-06 DIAGNOSIS — M791 Myalgia, unspecified site: Secondary | ICD-10-CM | POA: Diagnosis not present

## 2021-02-06 DIAGNOSIS — N644 Mastodynia: Secondary | ICD-10-CM | POA: Diagnosis not present

## 2021-02-06 NOTE — Telephone Encounter (Signed)
Requesting: lorazepam Contract: 12/23/17 UDS: 12/23/17 Last Visit: 12/27/20 Next Visit: 02/07/21 Last Refill: 12/20/20  Please Advise

## 2021-02-07 ENCOUNTER — Encounter: Payer: Self-pay | Admitting: *Deleted

## 2021-02-07 ENCOUNTER — Ambulatory Visit (INDEPENDENT_AMBULATORY_CARE_PROVIDER_SITE_OTHER): Payer: PPO | Admitting: Family Medicine

## 2021-02-07 ENCOUNTER — Other Ambulatory Visit: Payer: Self-pay

## 2021-02-07 VITALS — BP 106/60 | HR 61 | Temp 98.0°F | Resp 16 | Wt 142.6 lb

## 2021-02-07 DIAGNOSIS — R739 Hyperglycemia, unspecified: Secondary | ICD-10-CM | POA: Diagnosis not present

## 2021-02-07 DIAGNOSIS — Z79899 Other long term (current) drug therapy: Secondary | ICD-10-CM

## 2021-02-07 DIAGNOSIS — G4700 Insomnia, unspecified: Secondary | ICD-10-CM | POA: Diagnosis not present

## 2021-02-07 DIAGNOSIS — C50911 Malignant neoplasm of unspecified site of right female breast: Secondary | ICD-10-CM | POA: Diagnosis not present

## 2021-02-07 DIAGNOSIS — C792 Secondary malignant neoplasm of skin: Secondary | ICD-10-CM | POA: Diagnosis not present

## 2021-02-07 DIAGNOSIS — E782 Mixed hyperlipidemia: Secondary | ICD-10-CM

## 2021-02-07 DIAGNOSIS — M549 Dorsalgia, unspecified: Secondary | ICD-10-CM

## 2021-02-07 NOTE — Assessment & Plan Note (Signed)
Encouraged heart healthy diet, increase exercise, avoid trans fats, consider a krill oil cap daily 

## 2021-02-07 NOTE — Patient Instructions (Signed)
Pneumovax (PCV23) is the pneumonia shot I recommend, can get at pharmacy or at office

## 2021-02-07 NOTE — Assessment & Plan Note (Signed)
hgba1c acceptable, minimize simple carbs. Increase exercise as tolerated.  

## 2021-02-07 NOTE — Progress Notes (Signed)
Patient ID: Anne [name], female    DOB: [birth year]  Age: 80 y.o. MRN: [id number]    Subjective:  Subjective  HPI Anne [name] presents for office visit today.She reports that things today are stable and nothing needs to be addressed, but keeping up with the issues found. She reports she has done her CBC yesterday. She reports pain in left area of the breast, which she is seeing a specialist for next month and getting an MRI. She reports the pain has been increasing and getting worse. She reports while gardening she bent down and as a result she experienced severe back pain. She states that the pain started to stabilize. She is taking tylenol and other pain medications to deal with the pain. She denies any chest pain, SOB, fever, abdominal pain, cough, chills, sore throat, dysuria, urinary incontinence, HA, or N/VD. She reports there was lesion found in the brain localized in the frontal region. She reports having 2 abd tests regarding complaints about her spleen.  Review of Systems  Constitutional: Negative for chills, fatigue and fever.  HENT: Negative for congestion, rhinorrhea, sinus pressure, sinus pain and sore throat.   Eyes: Negative for pain.  Respiratory: Negative for cough and shortness of breath.   Cardiovascular: Negative for chest pain, palpitations and leg swelling.  Gastrointestinal: Negative for abdominal pain, blood in stool, diarrhea, nausea and vomiting.  Genitourinary: Negative for decreased urine volume, flank pain, frequency, vaginal bleeding and vaginal discharge.  Musculoskeletal: Positive for back pain.  Neurological: Negative for headaches.    History Past Medical History:  Diagnosis Date  . ALKALINE PHOSPHATASE, ELEVATED 03/15/2009  . Allergic state 06/10/2012  . Allergy   . Anemia   . Anxiety and depression 04/28/2011  . Arthritis   . Asthma   . Atypical chest pain 11/30/2011  . 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 Baker's cyst of  knee 05/22/2011  . Cancer ([place]) 01,  08   XRT/chemo 01-02/ lobular invasive ca  . Carotid artery disease ([place])    a. Carotid duplex 03/2014: stable 1-39% BICA, f/u due 03/2016.  [name] Chronic alcoholism in remission ([place]) 03/29/2011   Did not tolerate Klonopin, caused some confusion and bad dreams.    . Clotting disorder ([place])   . COPD (chronic obstructive pulmonary disease) ([place])   . Dermatitis 11/23/2012  . EE (eosinophilic esophagitis)   . Emphysema of lung ([place])   . Esophageal ring   . ESOPHAGEAL STRICTURE 03/29/2009  . Fall 11/23/2012  . Family history of breast cancer   . Family history of colon cancer   . Family history of ovarian cancer   . Family history of pancreatic cancer   . Folliculitis of nose 01/03/9797  . GERD (gastroesophageal reflux disease) 09/29/2009   improved s/p cholecystectomy and esophagus dilatation  . History of chicken pox   . History of measles   . History of shingles    2 episodes  . Hx of echocardiogram    a. Echo 01/2013: mild LVH, EF 55-60%, normal wall motion, Gr 1 diast dysfn  . Hyperlipidemia   . Knee pain, bilateral 07/23/2011  . Medicare annual wellness visit, subsequent 06/19/2015  . Mixed hyperlipidemia 10/17/2010   Qualifier: Diagnosis of  By: Mack Guise    . Orthostasis   . Osteopenia 03/14/2011  . Osteoporosis   . Personal history of chemotherapy 2001  . Personal history of radiation therapy 2001   rt breast  . PERSONAL HX BREAST  CANCER 09/29/2009  . PVC's (premature ventricular contractions)    a. Event monitor 01/2013: NSR, extensive PVCs.  . Radial neck fracture 10/2011   minimally displaced  . Substance abuse ([place])    In remission 3 years.   . Urinary incontinence 03/19/2012  . Vaginitis 05/22/2011    She has a past surgical history that includes ERCP (2010); Cholecystectomy (2010); Esophagogastroduodenoscopy (egd) with esophageal dilation (2010, 2012); Colonoscopy (09/05/10); Esophagogastroduodenoscopy (01/08/2012); Savory dilation  (01/08/2012); Breast reconstruction (2008, 2009, 2010); laparoscopic appendectomy (N/A, 08/04/2016); Dental surgery (05/2016); Augmentation mammaplasty (Right, 05/27/2007); Mastectomy modified radical (Right, 05/27/2007); Breast biopsy (Right, 01/02/2007); Breast biopsy (12/26/2006); Breast lumpectomy (Right, 2001); Breast reduction with mastopexy (Left, 05/30/2017); Mastopexy (Left, 11/21/2017); Liposuction with lipofilling (Left, 11/21/2017); Reduction mammaplasty (Left); and Appendectomy.   Her family history includes Alcohol abuse in her maternal grandfather; Anxiety disorder in her sister; Arthritis in her sister; Breast cancer in her cousin, cousin, maternal aunt, maternal aunt, maternal aunt, and paternal aunt; Cirrhosis in her sister; Colon cancer in her maternal uncle; Esophageal cancer in her paternal grandfather; Heart disease in her father and paternal grandfather; Lung cancer in her father and paternal aunt; Osteoporosis in her sister and sister; Other in her mother; Ovarian cancer (age of onset: 30) in her maternal aunt; Pancreatic cancer in her maternal aunt and maternal uncle; Rectal cancer in her paternal grandfather; Skin cancer in her sister; Stomach cancer in her maternal uncle; Stroke in her maternal grandmother.She reports that she quit smoking about 13 years ago. Her smoking use included cigarettes. She has a 50.00 pack-year smoking history. She has never used smokeless tobacco. She reports previous alcohol use. She reports that she does not use drugs.  Current Outpatient Medications on File Prior to Visit  Medication Sig Dispense Refill  . acetaminophen (TYLENOL) 500 MG tablet Take 500-1,000 mg by mouth every 6 (six) hours as needed (for pain.).    [name] amitriptyline (ELAVIL) 25 MG tablet Take 6 tablets (150 mg total) by mouth at bedtime. 180 tablet 1  . aspirin 81 MG EC tablet Take 81 mg by mouth daily.    [name] atorvastatin (LIPITOR) 20 MG tablet Take 1 tablet by mouth in the morning and at  bedtime. 90 tablet 3  . Biotin 5 MG CAPS Take by mouth.    . Cholecalciferol (VITAMIN D3) 1000 UNITS CAPS Take 2,000 Units by mouth daily.    . Cyanocobalamin (VITAMIN B 12 PO) Take 1 tablet by mouth daily.    [name] diltiazem (CARDIZEM) 30 MG tablet TAKE 1 TABLET BY MOUTH DAILY FOR PALPITATIONS 90 tablet 3  . DULoxetine (CYMBALTA) 20 MG capsule Take 1 capsule (20 mg total) by mouth daily. 30 capsule 1  . Ferrous Fumarate-Folic Acid 202-5 MG TABS Take 1 tablet by mouth daily. 30 each 3  . letrozole (FEMARA) 2.5 MG tablet Take 1 tablet (2.5 mg total) by mouth daily. 90 tablet 3  . loratadine (CLARITIN) 10 MG tablet Take 1 tablet (10 mg total) by mouth daily. 30 tablet 11  . LORazepam (ATIVAN) 1 MG tablet TAKE 1 TABLET(1 MG) BY MOUTH EVERY 8 HOURS AS NEEDED FOR ANXIETY 70 tablet 1  . meclizine (ANTIVERT) 12.5 MG tablet Take 1 tablet by mouth as needed.    . meloxicam (MOBIC) 15 MG tablet One tab PO qAM with a meal for 2 weeks, then daily prn pain. 30 tablet 3  . palbociclib (IBRANCE) 125 MG tablet Take 1 tablet (125 mg total) by mouth daily. Take for 21 days  on, 7 days off, repeat every 28 days. 21 tablet 0  . pantoprazole (PROTONIX) 40 MG tablet TAKE 1 TABLET(40 MG) BY MOUTH DAILY 90 tablet 1  . rOPINIRole (REQUIP) 0.25 MG tablet Take 1 tablet (0.25 mg total) by mouth at bedtime. 30 tablet 0  . traMADol (ULTRAM) 50 MG tablet Take 0.5-1 tablets (25-50 mg total) by mouth every 8 (eight) hours as needed. 20 tablet 0  . triamcinolone cream (KENALOG) 0.1 % Apply topically 2 (two) times daily. Apply to affected area 45 g 1   No current facility-administered medications on file prior to visit.     Objective:  Objective  Physical Exam Constitutional:      General: She is not in acute distress.    Appearance: Normal appearance. She is not ill-appearing or toxic-appearing.  HENT:     Head: Normocephalic and atraumatic.     Right Ear: Tympanic membrane, ear canal and external ear normal.     Left Ear:  Tympanic membrane, ear canal and external ear normal.     Nose: No congestion or rhinorrhea.  Eyes:     Extraocular Movements: Extraocular movements intact.     Pupils: Pupils are equal, round, and reactive to light.  Cardiovascular:     Rate and Rhythm: Normal rate and regular rhythm.     Pulses: Normal pulses.     Heart sounds: Normal heart sounds. No murmur heard.   Pulmonary:     Effort: Pulmonary effort is normal. No respiratory distress.     Breath sounds: Normal breath sounds. No wheezing, rhonchi or rales.  Abdominal:     General: Bowel sounds are normal.     Palpations: Abdomen is soft. There is no mass.     Tenderness: There is no abdominal tenderness. There is no guarding.     Hernia: No hernia is present.  Musculoskeletal:        General: Normal range of motion.     Cervical back: Normal range of motion and neck supple.  Skin:    General: Skin is warm and dry.  Neurological:     Mental Status: She is alert and oriented to person, place, and time.  Psychiatric:        Behavior: Behavior normal.    BP 106/60   Pulse 61   Temp 98 F (36.7 C)   Resp 16   Wt 142 lb 9.6 oz (64.7 kg)   SpO2 96%   BMI 23.37 kg/m  Wt Readings from Last 3 Encounters:  02/07/21 142 lb 9.6 oz (64.7 kg)  01/27/21 144 lb 9.6 oz (65.6 kg)  12/27/20 143 lb 12.8 oz (65.2 kg)     Lab Results  Component Value Date   WBC 8.7 12/29/2020   HGB 12.7 12/29/2020   HCT 38.0 12/29/2020   PLT 194.0 12/29/2020   GLUCOSE 73 12/29/2020   CHOL 178 12/29/2020   TRIG 139.0 12/29/2020   HDL 69.70 12/29/2020   LDLDIRECT 117.4 01/13/2013   LDLCALC 81 12/29/2020   ALT 21 12/29/2020   AST 19 12/29/2020   NA 138 12/29/2020   K 3.7 12/29/2020   CL 100 12/29/2020   CREATININE 0.80 12/29/2020   BUN 20 12/29/2020   CO2 30 12/29/2020   TSH 4.37 12/29/2020   INR 1.02 08/12/2017   HGBA1C 5.4 02/23/2020    No results found.   Assessment & Plan:  Plan  @ENCMEDP @  No orders of the defined types  were placed in this encounter.  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 She is pleased with her care. She has experienced an increase in pain over chest wall/breast so they have her scheduled for an MRI of the area next month      Back pain    Was bending in the garden when she had a sharp spasm in her back a couple of weeks ago. It is notably better and at this time she does not feel she needs a referral presently. Encouraged moist heat and gentle stretching as tolerated. May try NSAIDs and prescription meds as directed and report if symptoms worsen or seek immediate care.      Hyperglycemia    hgba1c acceptable, minimize simple carbs. Increase exercise as tolerated.       Insomnia    Encouraged good sleep hygiene such as dark, quiet room. No blue/green glowing lights such as computer screens in bedroom. No alcohol or stimulants in evening. Cut down on caffeine as able. Regular exercise is helpful but not just prior to bed time. May use Lorazepam prn       Other Visit Diagnoses    High risk medication use    -  Primary   Relevant Orders   DRUG MONITORING, PANEL 8 WITH CONFIRMATION, URINE      Follow-up: Return in about 6 months (around 08/10/2021) for annual exam.   I,David Hanna,acting as a scribe for Penni Homans, MD.,have documented all relevant documentation on the behalf of Penni Homans, MD,as directed by  Penni Homans, MD while in the presence of Penni Homans, MD.  I, [name], MD personally performed the services described in this documentation. All medical record entries made by the scribe were at my direction and in my presence. I have reviewed the chart and agree that the record reflects my personal performance and is  accurate and complete

## 2021-02-08 ENCOUNTER — Encounter: Payer: Self-pay | Admitting: Family Medicine

## 2021-02-08 NOTE — Assessment & Plan Note (Addendum)
She is following with oncology at [facility] presently and is doing well. She is pleased with her care. She has experienced an increase in pain over chest wall/breast so they have her scheduled for an MRI of the area next month

## 2021-02-08 NOTE — Assessment & Plan Note (Addendum)
Encouraged good sleep hygiene such as dark, quiet room. No blue/green glowing lights such as computer screens in bedroom. No alcohol or stimulants in evening. Cut down on caffeine as able. Regular exercise is helpful but not just prior to bed time. May use Lorazepam prn

## 2021-02-08 NOTE — Assessment & Plan Note (Signed)
Was bending in the garden when she had a sharp spasm in her back a couple of weeks ago. It is notably better and at this time she does not feel she needs a referral presently. Encouraged moist heat and gentle stretching as tolerated. May try NSAIDs and prescription meds as directed and report if symptoms worsen or seek immediate care.

## 2021-02-10 ENCOUNTER — Other Ambulatory Visit: Payer: Self-pay | Admitting: Family Medicine

## 2021-02-10 LAB — DRUG MONITORING, PANEL 8 WITH CONFIRMATION, URINE
6 Acetylmorphine: NEGATIVE ng/mL (ref <10)
Alcohol Metabolites: NEGATIVE ng/mL
Alphahydroxyalprazolam: NEGATIVE ng/mL (ref <25)
Alphahydroxymidazolam: NEGATIVE ng/mL (ref <50)
Alphahydroxytriazolam: NEGATIVE ng/mL (ref <50)
Aminoclonazepam: NEGATIVE ng/mL (ref <25)
Amphetamines: NEGATIVE ng/mL (ref <500)
Benzodiazepines: POSITIVE ng/mL — AB (ref <100)
Buprenorphine, Urine: NEGATIVE ng/mL (ref <5)
Cocaine Metabolite: NEGATIVE ng/mL (ref <150)
Creatinine: 49.1 mg/dL
Hydroxyethylflurazepam: NEGATIVE ng/mL (ref <50)
Lorazepam: 772 ng/mL — ABNORMAL HIGH (ref <50)
MDMA: NEGATIVE ng/mL (ref <500)
Marijuana Metabolite: NEGATIVE ng/mL (ref <20)
Nordiazepam: NEGATIVE ng/mL (ref <50)
Opiates: NEGATIVE ng/mL (ref <100)
Oxazepam: NEGATIVE ng/mL (ref <50)
Oxidant: NEGATIVE ug/mL
Oxycodone: NEGATIVE ng/mL (ref <100)
Temazepam: NEGATIVE ng/mL (ref <50)
pH: 7 (ref 4.5–9.0)

## 2021-02-10 LAB — DM TEMPLATE

## 2021-02-20 DIAGNOSIS — Z170 Estrogen receptor positive status [ER+]: Secondary | ICD-10-CM | POA: Diagnosis not present

## 2021-02-20 DIAGNOSIS — R0602 Shortness of breath: Secondary | ICD-10-CM | POA: Diagnosis not present

## 2021-02-20 DIAGNOSIS — Z853 Personal history of malignant neoplasm of breast: Secondary | ICD-10-CM | POA: Diagnosis not present

## 2021-02-20 DIAGNOSIS — Z87891 Personal history of nicotine dependence: Secondary | ICD-10-CM | POA: Diagnosis not present

## 2021-02-20 DIAGNOSIS — R42 Dizziness and giddiness: Secondary | ICD-10-CM | POA: Diagnosis not present

## 2021-02-20 DIAGNOSIS — K5909 Other constipation: Secondary | ICD-10-CM | POA: Diagnosis not present

## 2021-02-20 DIAGNOSIS — D7389 Other diseases of spleen: Secondary | ICD-10-CM | POA: Diagnosis not present

## 2021-02-20 DIAGNOSIS — Z79811 Long term (current) use of aromatase inhibitors: Secondary | ICD-10-CM | POA: Diagnosis not present

## 2021-02-20 DIAGNOSIS — C50911 Malignant neoplasm of unspecified site of right female breast: Secondary | ICD-10-CM | POA: Diagnosis not present

## 2021-02-20 DIAGNOSIS — G893 Neoplasm related pain (acute) (chronic): Secondary | ICD-10-CM | POA: Diagnosis not present

## 2021-02-20 DIAGNOSIS — J439 Emphysema, unspecified: Secondary | ICD-10-CM | POA: Diagnosis not present

## 2021-02-20 DIAGNOSIS — R21 Rash and other nonspecific skin eruption: Secondary | ICD-10-CM | POA: Diagnosis not present

## 2021-02-20 DIAGNOSIS — Z9189 Other specified personal risk factors, not elsewhere classified: Secondary | ICD-10-CM | POA: Diagnosis not present

## 2021-02-20 DIAGNOSIS — M792 Neuralgia and neuritis, unspecified: Secondary | ICD-10-CM | POA: Diagnosis not present

## 2021-02-20 DIAGNOSIS — C792 Secondary malignant neoplasm of skin: Secondary | ICD-10-CM | POA: Diagnosis not present

## 2021-02-22 DIAGNOSIS — Z85830 Personal history of malignant neoplasm of bone: Secondary | ICD-10-CM | POA: Diagnosis not present

## 2021-02-22 DIAGNOSIS — Z79899 Other long term (current) drug therapy: Secondary | ICD-10-CM | POA: Diagnosis not present

## 2021-02-22 DIAGNOSIS — Z87891 Personal history of nicotine dependence: Secondary | ICD-10-CM | POA: Diagnosis not present

## 2021-02-22 DIAGNOSIS — Z9011 Acquired absence of right breast and nipple: Secondary | ICD-10-CM | POA: Diagnosis not present

## 2021-02-22 DIAGNOSIS — Z8709 Personal history of other diseases of the respiratory system: Secondary | ICD-10-CM | POA: Diagnosis not present

## 2021-02-22 DIAGNOSIS — K5909 Other constipation: Secondary | ICD-10-CM | POA: Diagnosis not present

## 2021-02-22 DIAGNOSIS — R42 Dizziness and giddiness: Secondary | ICD-10-CM | POA: Diagnosis not present

## 2021-02-22 DIAGNOSIS — M792 Neuralgia and neuritis, unspecified: Secondary | ICD-10-CM | POA: Diagnosis not present

## 2021-02-22 DIAGNOSIS — C50911 Malignant neoplasm of unspecified site of right female breast: Secondary | ICD-10-CM | POA: Diagnosis not present

## 2021-02-22 DIAGNOSIS — Z08 Encounter for follow-up examination after completed treatment for malignant neoplasm: Secondary | ICD-10-CM | POA: Diagnosis not present

## 2021-02-22 DIAGNOSIS — Z853 Personal history of malignant neoplasm of breast: Secondary | ICD-10-CM | POA: Diagnosis not present

## 2021-02-25 ENCOUNTER — Emergency Department
Admission: EM | Admit: 2021-02-25 | Discharge: 2021-02-25 | Disposition: A | Discharge status: Home / Self Care | Payer: PPO | Source: Home / Self Care

## 2021-02-25 ENCOUNTER — Encounter: Payer: Self-pay | Admitting: Emergency Medicine

## 2021-02-25 ENCOUNTER — Other Ambulatory Visit: Payer: Self-pay

## 2021-02-25 DIAGNOSIS — S20462A Insect bite (nonvenomous) of left back wall of thorax, initial encounter: Secondary | ICD-10-CM

## 2021-02-25 DIAGNOSIS — L03114 Cellulitis of left upper limb: Secondary | ICD-10-CM

## 2021-02-25 DIAGNOSIS — W57XXXA Bitten or stung by nonvenomous insect and other nonvenomous arthropods, initial encounter: Secondary | ICD-10-CM

## 2021-02-25 MED ORDER — AMOXICILLIN-POT CLAVULANATE 875-125 MG PO TABS: 1.0000 | ORAL_TABLET | Freq: Two times a day (BID) | ORAL | 0 refills | Status: AC

## 2021-02-25 MED ORDER — TRIAMCINOLONE ACETONIDE 0.1 % EX CREA: 1.0000 "application " | TOPICAL_CREAM | Freq: Two times a day (BID) | CUTANEOUS | 0 refills | Status: DC

## 2021-02-25 NOTE — Discharge Instructions (Signed)
I have sent in Augmentin for you to take twice a day for 7 days.  I have sent in triamcinolone cream for you to use to the area twice a day as needed.  If the areas are not improving over the next 48 hours, follow up with this office or with PCP  Go to the ER for high fever, red streaking up the arm, trouble swallowing, trouble breathing, other concerning symtpoms

## 2021-02-25 NOTE — ED Triage Notes (Signed)
Pt states that she has insect bites on her left arm, left side rib area, right shoulder.  The areas are painful, no itching.  Patient was out in the woods and noticed after that.

## 2021-02-25 NOTE — ED Provider Notes (Signed)
[place]   [id number] 02/25/21 Arrival Time: 3382  CC: RASH  SUBJECTIVE:  Anne [name] is a 80 y.o. female who presents with a skin complaint that began 2-3 days ago after being outside in the woods. Reports that she was bitten a few times by some sort of insect. Localizes the rash to inner aspect of L elbow, one bite to R shoulder and multiple bites to L flank. Describes it as red, raised, itchy and painful. Has tried benadryl cream without relief. There are no aggravating or alleviating factors. Denies similar symptoms in the past. Denies fever, chills, nausea, vomiting, discharge, oral lesions, SOB, chest pain, abdominal pain, changes in bowel or bladder function.    ROS: As per HPI.  All other pertinent ROS negative.     Past Medical History:  Diagnosis Date  . ALKALINE PHOSPHATASE, ELEVATED 03/15/2009  . Allergic state 06/10/2012  . Allergy   . Anemia   . Anxiety and depression 04/28/2011  . Arthritis   . Asthma   . Atypical chest pain 11/30/2011  . 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 Baker's cyst of knee 05/22/2011  . Cancer ([place]) 01,  08   XRT/chemo 01-02/ lobular invasive ca  . Carotid artery disease ([place])    a. Carotid duplex 03/2014: stable 1-39% BICA, f/u due 03/2016.  [name] Chronic alcoholism in remission ([place]) 03/29/2011   Did not tolerate Klonopin, caused some confusion and bad dreams.    . Clotting disorder ([place])   . COPD (chronic obstructive pulmonary disease) ([place])   . Dermatitis 11/23/2012  . EE (eosinophilic esophagitis)   . Emphysema of lung ([place])   . Esophageal ring   . ESOPHAGEAL STRICTURE 03/29/2009  . Fall 11/23/2012  . Family history of breast cancer   . Family history of colon cancer   . Family history of ovarian cancer   . Family history of pancreatic cancer   . Folliculitis of nose 03/17/3975  . GERD (gastroesophageal reflux disease) 09/29/2009   improved s/p cholecystectomy and esophagus dilatation  . History of chicken  pox   . History of measles   . History of shingles    2 episodes  . Hx of echocardiogram    a. Echo 01/2013: mild LVH, EF 55-60%, normal wall motion, Gr 1 diast dysfn  . Hyperlipidemia   . Knee pain, bilateral 07/23/2011  . Medicare annual wellness visit, subsequent 06/19/2015  . Mixed hyperlipidemia 10/17/2010   Qualifier: Diagnosis of  By: Mack Guise    . Orthostasis   . Osteopenia 03/14/2011  . Osteoporosis   . Personal history of chemotherapy 2001  . Personal history of radiation therapy 2001   rt breast  . 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 PVC's (premature ventricular contractions)    a. Event monitor 01/2013: NSR, extensive PVCs.  . Radial neck fracture 10/2011   minimally displaced  . Substance abuse ([place])    In remission 3 years.   . Urinary incontinence 03/19/2012  . Vaginitis 05/22/2011   Past Surgical History:  Procedure Laterality Date  . APPENDECTOMY    . AUGMENTATION MAMMAPLASTY Right 05/27/2007  . BREAST BIOPSY Right 01/02/2007   wire loc  . BREAST BIOPSY  12/26/2006  . BREAST LUMPECTOMY Right 2001  . BREAST RECONSTRUCTION  2008, 2009, 2010  . BREAST REDUCTION WITH MASTOPEXY Left 05/30/2017   Procedure: LEFT BREAST REDUCTION FOR SYMTERY WITH MASTOPEXY;  Surgeon: Wallace Going, DO;  Location: [facility];  Service: Clinical cytogeneticist;  Laterality: Left;  . CHOLECYSTECTOMY  2010  . COLONOSCOPY  09/05/10   cecal avm's  . DENTAL SURGERY  05/2016   4 dental implants by Dr. Loyal Gambler.  [name] ERCP  2010    CBD stone extraction   . ESOPHAGOGASTRODUODENOSCOPY  01/08/2012   Procedure: ESOPHAGOGASTRODUODENOSCOPY (EGD);  Surgeon: Gatha Mayer, MD;  Location: [facility];  Service: Endoscopy;  Laterality: N/A;  . ESOPHAGOGASTRODUODENOSCOPY (EGD) WITH ESOPHAGEAL DILATION  2010, 2012  . LAPAROSCOPIC APPENDECTOMY N/A 08/04/2016   Procedure: APPENDECTOMY LAPAROSCOPIC;  Surgeon: Michael Boston, MD;  Location: [facility];  Service: General;  Laterality: N/A;  .  LIPOSUCTION WITH LIPOFILLING Left 11/21/2017   Procedure: LIPOSUCTION FROM ABDOMEN WITH LIPOFILLING TO LEFT BREAST;  Surgeon: Wallace Going, DO;  Location: [facility];  Service: Plastics;  Laterality: Left;  [name] MASTECTOMY MODIFIED RADICAL Right 05/27/2007   , Mastectomy modified radical (08), breast reconstruction, CA lesions excised lateral abd wall 2010  . MASTOPEXY Left 11/21/2017   Procedure: LEFT BREAST REVISION MASTOPEXY FOR SYMMETRY;  Surgeon: Wallace Going, DO;  Location: [facility];  Service: Plastics;  Laterality: Left;  . REDUCTION MAMMAPLASTY Left   . SAVORY DILATION  01/08/2012   Procedure: SAVORY DILATION;  Surgeon: Gatha Mayer, MD;  Location: [facility];  Service: Endoscopy;  Laterality: N/A;  need xray   Allergies  Allergen Reactions  . Codeine Other (See Comments)    "flu like symptoms" Causes depression  . Morphine Other (See Comments)    "flu like symptoms"  . Peanut-Containing Drug Products Hives  . Sorbitol Other (See Comments)    GI Issues  . Tomato Diarrhea  . Zofran Other (See Comments)    headache  . Advil [Ibuprofen] Other (See Comments)    Irritates throat.  . Diphenhydramine Hcl Other (See Comments)    "nervous and upset"  . Oxycodone Anxiety    Confusion with inability to think clearly   No current facility-administered medications on file prior to encounter.   Current Outpatient Medications on File Prior to Encounter  Medication Sig Dispense Refill  . acetaminophen (TYLENOL) 500 MG tablet Take 500-1,000 mg by mouth every 6 (six) hours as needed (for pain.).    [name] amitriptyline (ELAVIL) 25 MG tablet Take 6 tablets (150 mg total) by mouth at bedtime. 180 tablet 5  . aspirin 81 MG EC tablet Take 81 mg by mouth daily.    [name] atorvastatin (LIPITOR) 20 MG tablet Take 1 tablet by mouth in the morning and at bedtime. 90 tablet 3  . Biotin 5 MG CAPS Take by mouth.    . Cholecalciferol (VITAMIN D3) 1000 UNITS CAPS  Take 2,000 Units by mouth daily.    [name] COVID-19 mRNA vaccine, Pfizer, 30 MCG/0.3ML injection AS DIRECTED .3 mL 0  . Cyanocobalamin (VITAMIN B 12 PO) Take 1 tablet by mouth daily.    [name] diltiazem (CARDIZEM) 30 MG tablet TAKE 1 TABLET BY MOUTH DAILY FOR PALPITATIONS 90 tablet 3  . DULoxetine (CYMBALTA) 20 MG capsule Take 1 capsule (20 mg total) by mouth daily. 30 capsule 1  . Ferrous Fumarate-Folic Acid 700-1 MG TABS Take 1 tablet by mouth daily. 30 each 3  . influenza vaccine adjuvanted (FLUAD) 0.5 ML injection TO BE ADMINISTERED AS DIRECTED .5 mL 0  . letrozole (FEMARA) 2.5 MG tablet Take 1 tablet (2.5 mg total) by mouth daily. 90 tablet 3  . loratadine (CLARITIN) 10 MG tablet Take 1 tablet (10 mg total)  by mouth daily. 30 tablet 11  . LORazepam (ATIVAN) 1 MG tablet TAKE 1 TABLET(1 MG) BY MOUTH EVERY 8 HOURS AS NEEDED FOR ANXIETY 70 tablet 1  . meclizine (ANTIVERT) 12.5 MG tablet Take 1 tablet by mouth as needed.    . meloxicam (MOBIC) 15 MG tablet One tab PO qAM with a meal for 2 weeks, then daily prn pain. 30 tablet 3  . palbociclib (IBRANCE) 125 MG tablet Take 1 tablet (125 mg total) by mouth daily. Take for 21 days on, 7 days off, repeat every 28 days. 21 tablet 0  . pantoprazole (PROTONIX) 40 MG tablet TAKE 1 TABLET(40 MG) BY MOUTH DAILY 90 tablet 1  . rOPINIRole (REQUIP) 0.25 MG tablet Take 1 tablet (0.25 mg total) by mouth at bedtime. 30 tablet 0  . traMADol (ULTRAM) 50 MG tablet Take 0.5-1 tablets (25-50 mg total) by mouth every 8 (eight) hours as needed. 20 tablet 0   Social History   Socioeconomic History  . Marital status: Married    Spouse name: Sam  . Number of children: 3  . Years of education: Not on file  . Highest education level: Not on file  Occupational History  . Occupation: Social worker  Tobacco Use  . Smoking status: Former Smoker    Packs/day: 1.00    Years: 50.00    Pack years: 50.00    Types: Cigarettes    Quit date: 05/22/2007    Years since quitting: 13.7  .  Smokeless tobacco: Never Used  Vaping Use  . Vaping Use: Never used  Substance and Sexual Activity  . Alcohol use: Not Currently  . Drug use: No  . Sexual activity: Yes    Partners: Male    Comment: lives with husband,   Other Topics Concern  . Not on file  Social History Narrative  . Not on file   Social Determinants of Health   Financial Resource Strain: Not on file  Food Insecurity: Not on file  Transportation Needs: Not on file  Physical Activity: Not on file  Stress: Not on file  Social Connections: Not on file  Intimate Partner Violence: Not on file   Family History  Problem Relation Age of Onset  . Heart disease Father   . Lung cancer Father        smoker  . Cirrhosis Sister        Primary Biliary  . Stroke Maternal Grandmother   . Alcohol abuse Maternal Grandfather   . Heart disease Paternal Grandfather   . Esophageal cancer Paternal Grandfather   . Rectal cancer Paternal Grandfather   . Anxiety disorder Sister   . Osteoporosis Sister   . Arthritis Sister        Rheumatoid  . Osteoporosis Sister   . Skin cancer Sister        multiple skin cancers, over 27 excisions.  . Other Mother        tic [place]  . Breast cancer Maternal Aunt        dx in her 42s  . Breast cancer Paternal Aunt        dx in her 75s  . Pancreatic cancer Maternal Uncle        dx in his 60s; smoker  . Breast cancer Maternal Aunt        dx in her 63s  . Breast cancer Maternal Aunt        possible breast cancer dx and died in her 71s  . Ovarian cancer  Maternal Aunt 29  . Pancreatic cancer Maternal Aunt        dx in her 42s  . Stomach cancer Maternal Uncle   . Colon cancer Maternal Uncle   . Lung cancer Paternal Aunt   . Breast cancer Cousin        paternal first cousin  . Breast cancer Cousin        maternal first cousin  . Anesthesia problems Neg Hx   . Hypotension Neg Hx   . Malignant hyperthermia Neg Hx   . Pseudochol deficiency Neg Hx     OBJECTIVE: Vitals:    02/25/21 1453  BP: 115/74  Pulse: 92  SpO2: 95%    General appearance: alert; no distress Head: NCAT Lungs: clear to auscultation bilaterally Heart: regular rate and rhythm.  Radial pulse 2+ bilaterally Extremities: no edema Skin: warm and dry; There are two areas about 1.5 cm in diameter to the inner aspect of the L elbow erythematous, warm to touch, and tender, no drainage from this area; also has same type of lesions to L flank but these are much smaller, no warmth, no tenderness, no drainage noted Psychological: alert and cooperative; normal mood and affect  ASSESSMENT & PLAN:  1. Cellulitis of left upper extremity   2. Insect bite of left back wall of thorax, initial encounter     Meds ordered this encounter  Medications  . amoxicillin-clavulanate (AUGMENTIN) 875-125 MG tablet    Sig: Take 1 tablet by mouth 2 (two) times daily for 7 days.    Dispense:  14 tablet    Refill:  0    Order Specific Question:   Supervising Provider    Answer:   Chase Picket [id number]  . triamcinolone cream (KENALOG) 0.1 %    Sig: Apply 1 application topically 2 (two) times daily.    Dispense:  30 g    Refill:  0    Order Specific Question:   Supervising Provider    Answer:   Chase Picket [[id number]]   Prescribed Augmentin BID x 7 days for cellulitis Prescribed triamcinolone cream to apply topically to the areas for itching BID prn Take as prescribed and to completion Avoid hot showers/ baths Moisturize skin daily  Follow up with PCP if symptoms persists Return or go to the ER if you have any new or worsening symptoms such as fever, chills, nausea, vomiting, redness, swelling, discharge, if symptoms do not improve with medications  Reviewed expectations re: course of current medical issues. Questions answered. Outlined signs and symptoms indicating need for more acute intervention. Patient verbalized understanding. After Visit Summary given.   Faustino Congress, NP 02/26/21  [phone]

## 2021-03-03 ENCOUNTER — Other Ambulatory Visit: Payer: Self-pay

## 2021-03-03 ENCOUNTER — Other Ambulatory Visit (HOSPITAL_COMMUNITY)
Admission: RE | Admit: 2021-03-03 | Discharge: 2021-03-03 | Disposition: A | Discharge status: Home / Self Care | Payer: PPO | Source: Ambulatory Visit | Attending: Family Medicine | Admitting: Family Medicine

## 2021-03-03 ENCOUNTER — Encounter: Payer: Self-pay | Admitting: Family Medicine

## 2021-03-03 ENCOUNTER — Ambulatory Visit (INDEPENDENT_AMBULATORY_CARE_PROVIDER_SITE_OTHER): Payer: PPO | Admitting: Family Medicine

## 2021-03-03 VITALS — BP 116/74 | HR 103 | Temp 98.5°F | Wt 137.0 lb

## 2021-03-03 DIAGNOSIS — N9089 Other specified noninflammatory disorders of vulva and perineum: Secondary | ICD-10-CM | POA: Diagnosis not present

## 2021-03-03 DIAGNOSIS — B373 Candidiasis of vulva and vagina: Secondary | ICD-10-CM | POA: Diagnosis not present

## 2021-03-03 DIAGNOSIS — B3731 Acute candidiasis of vulva and vagina: Secondary | ICD-10-CM

## 2021-03-03 MED ORDER — NYSTATIN 100000 UNIT/GM EX CREA: 1.0000 "application " | TOPICAL_CREAM | Freq: Two times a day (BID) | CUTANEOUS | 0 refills | Status: DC

## 2021-03-03 MED ORDER — FLUCONAZOLE 150 MG PO TABS
150.0000 mg | ORAL_TABLET | Freq: Once | ORAL | 0 refills | Status: AC
Start: 2021-03-03 — End: 2021-03-03

## 2021-03-03 NOTE — Patient Instructions (Signed)

## 2021-03-03 NOTE — Progress Notes (Signed)
Subjective:    Patient ID: Anne [name], female    DOB: 1941/01/18, 80 y.o.   MRN: [id number]  No chief complaint on file.   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  Patient endorses vaginal irritation x 2 days.  Recently started amoxicillin for cellulitis.  Patient denies vaginal discharge, suprapubic pain, dysuria, nausea, vomiting.  Past Medical History:  Diagnosis Date  . ALKALINE PHOSPHATASE, ELEVATED 03/15/2009  . Allergic state 06/10/2012  . Allergy   . Anemia   . Anxiety and depression 04/28/2011  . Arthritis   . Asthma   . Atypical chest pain 11/30/2011  . 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 Baker's cyst of knee 05/22/2011  . Cancer ([place]) 01,  08   XRT/chemo 01-02/ lobular invasive ca  . Carotid artery disease ([place])    a. Carotid duplex 03/2014: stable 1-39% BICA, f/u due 03/2016.  [name] Chronic alcoholism in remission ([place]) 03/29/2011   Did not tolerate Klonopin, caused some confusion and bad dreams.    . Clotting disorder ([place])   . COPD (chronic obstructive pulmonary disease) ([place])   . Dermatitis 11/23/2012  . EE (eosinophilic esophagitis)   . Emphysema of lung ([place])   . Esophageal ring   . ESOPHAGEAL STRICTURE 03/29/2009  . Fall 11/23/2012  . Family history of breast cancer   . Family history of colon cancer   . Family history of ovarian cancer   . Family history of pancreatic cancer   . Folliculitis of nose 3/47/4259  . GERD (gastroesophageal reflux disease) 09/29/2009   improved s/p cholecystectomy and esophagus dilatation  . History of chicken pox   . History of measles   . History of shingles    2 episodes  . Hx of echocardiogram    a. Echo 01/2013: mild LVH, EF 55-60%, normal wall motion, Gr 1 diast dysfn  . Hyperlipidemia   . Knee pain,  bilateral 07/23/2011  . Medicare annual wellness visit, subsequent 06/19/2015  . Mixed hyperlipidemia 10/17/2010   Qualifier: Diagnosis of  By: Mack Guise    . Orthostasis   . Osteopenia 03/14/2011  . Osteoporosis   . Personal history of chemotherapy 2001  . Personal history of radiation therapy 2001   rt breast  . 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 PVC's (premature ventricular contractions)    a. Event monitor 01/2013: NSR, extensive PVCs.  . Radial neck fracture 10/2011   minimally displaced  . Substance abuse ([place])    In remission 3 years.   . Urinary incontinence 03/19/2012  . Vaginitis 05/22/2011    Allergies  Allergen Reactions  . Codeine Other (See Comments)    "flu like symptoms" Causes depression  . Morphine Other (See Comments)    "flu like symptoms"  . Peanut-Containing Drug Products Hives  . Sorbitol Other (See Comments)    GI Issues  . Tomato Diarrhea  . Zofran Other (See Comments)    headache  . Advil [Ibuprofen] Other (See Comments)    Irritates throat.  . Diphenhydramine Hcl Other (See Comments)    "nervous and upset"  . Oxycodone Anxiety    Confusion with inability to think clearly    ROS General: Denies fever, chills, night sweats, changes in weight, changes in appetite HEENT: Denies headaches, ear  pain, changes in vision, rhinorrhea, sore throat CV: Denies CP, palpitations, SOB, orthopnea Pulm: Denies SOB, cough, wheezing GI: Denies abdominal pain, nausea, vomiting, diarrhea, constipation GU: Denies dysuria, hematuria, frequency +labial irritation Msk: Denies muscle cramps, joint pains Neuro: Denies weakness, numbness, tingling Skin: Denies rashes, bruising Psych: Denies depression, anxiety, hallucinations    Objective:    Blood pressure 116/74, pulse (!) 103, temperature 98.5 F (36.9 C), temperature source Oral, weight 137 lb (62.1 kg), SpO2 95 %.  Gen. Pleasant, well-nourished, in no distress, normal affect  HEENT: [place]/AT, face  symmetric, conjunctiva clear, no scleral icterus, PERRLA, EOMI, nares patent without drainage Lungs: no accessory muscle use Cardiovascular: RRR, no peripheral edema GU: normal external female genitalia, urethral meatus, and perineum, scant amount of white d/c noted on clitoral hood.  Mild erythema of skin folds/skin of labia majora and upper thighs bilaterally with satellite lesions.  Aptima swab obtained. Musculoskeletal: No deformities, no cyanosis or clubbing, normal tone Neuro:  A&Ox3, CN II-XII intact, normal gait Skin:  Warm, dry, intact.  Satellite lesions of groin.   Wt Readings from Last 3 Encounters:  03/03/21 137 lb (62.1 kg)  02/07/21 142 lb 9.6 oz (64.7 kg)  01/27/21 144 lb 9.6 oz (65.6 kg)    Lab Results  Component Value Date   WBC 8.7 12/29/2020   HGB 12.7 12/29/2020   HCT 38.0 12/29/2020   PLT 194.0 12/29/2020   GLUCOSE 73 12/29/2020   CHOL 178 12/29/2020   TRIG 139.0 12/29/2020   HDL 69.70 12/29/2020   LDLDIRECT 117.4 01/13/2013   LDLCALC 81 12/29/2020   ALT 21 12/29/2020   AST 19 12/29/2020   NA 138 12/29/2020   K 3.7 12/29/2020   CL 100 12/29/2020   CREATININE 0.80 12/29/2020   BUN 20 12/29/2020   CO2 30 12/29/2020   TSH 4.37 12/29/2020   INR 1.02 08/12/2017   HGBA1C 5.4 02/23/2020    Assessment/Plan:  Vulval candidosis  -Likely 2/2 antibiotic use - Plan: fluconazole (DIFLUCAN) 150 MG tablet, Cervicovaginal ancillary only, nystatin cream (MYCOSTATIN)  Labial irritation -Discussed preventative measures such as wearing underwear with moisture wicking or cotton fabric, etc. -Can use nystatin cream twice daily - Plan: Cervicovaginal ancillary only  F/u with PCP for continued or worsening symptoms.  Grier Mitts, MD

## 2021-03-07 LAB — CERVICOVAGINAL ANCILLARY ONLY
Bacterial Vaginitis (gardnerella): NEGATIVE
Candida Glabrata: POSITIVE — AB
Candida Vaginitis: POSITIVE — AB
Chlamydia: NEGATIVE
Comment: NEGATIVE
Comment: NEGATIVE
Comment: NEGATIVE
Comment: NEGATIVE
Comment: NEGATIVE
Comment: NORMAL
Neisseria Gonorrhea: NEGATIVE
Trichomonas: NEGATIVE

## 2021-03-10 DIAGNOSIS — Z170 Estrogen receptor positive status [ER+]: Secondary | ICD-10-CM | POA: Diagnosis not present

## 2021-03-10 DIAGNOSIS — C792 Secondary malignant neoplasm of skin: Secondary | ICD-10-CM | POA: Diagnosis not present

## 2021-03-10 DIAGNOSIS — C50911 Malignant neoplasm of unspecified site of right female breast: Secondary | ICD-10-CM | POA: Diagnosis not present

## 2021-03-13 ENCOUNTER — Encounter: Payer: Self-pay | Admitting: Family Medicine

## 2021-03-13 NOTE — Progress Notes (Signed)
Results viewed on MyChart. 

## 2021-03-14 ENCOUNTER — Other Ambulatory Visit: Payer: Self-pay | Admitting: Family Medicine

## 2021-03-14 MED ORDER — MUPIROCIN CALCIUM 2 % EX CREA: 1.0000 "application " | TOPICAL_CREAM | Freq: Two times a day (BID) | CUTANEOUS | 0 refills | Status: DC

## 2021-03-14 MED ORDER — FLUCONAZOLE 150 MG PO TABS: ORAL_TABLET | ORAL | 1 refills | Status: DC

## 2021-03-14 NOTE — Progress Notes (Unsigned)
an

## 2021-03-20 DIAGNOSIS — M792 Neuralgia and neuritis, unspecified: Secondary | ICD-10-CM | POA: Diagnosis not present

## 2021-03-20 DIAGNOSIS — R42 Dizziness and giddiness: Secondary | ICD-10-CM | POA: Diagnosis not present

## 2021-03-20 DIAGNOSIS — C50911 Malignant neoplasm of unspecified site of right female breast: Secondary | ICD-10-CM | POA: Diagnosis not present

## 2021-03-20 DIAGNOSIS — C792 Secondary malignant neoplasm of skin: Secondary | ICD-10-CM | POA: Diagnosis not present

## 2021-03-20 DIAGNOSIS — R040 Epistaxis: Secondary | ICD-10-CM | POA: Diagnosis not present

## 2021-03-20 DIAGNOSIS — Z79811 Long term (current) use of aromatase inhibitors: Secondary | ICD-10-CM | POA: Diagnosis not present

## 2021-03-20 DIAGNOSIS — Z9889 Other specified postprocedural states: Secondary | ICD-10-CM | POA: Diagnosis not present

## 2021-03-20 DIAGNOSIS — N6489 Other specified disorders of breast: Secondary | ICD-10-CM | POA: Diagnosis not present

## 2021-03-20 DIAGNOSIS — C7889 Secondary malignant neoplasm of other digestive organs: Secondary | ICD-10-CM | POA: Diagnosis not present

## 2021-03-20 DIAGNOSIS — Z5112 Encounter for antineoplastic immunotherapy: Secondary | ICD-10-CM | POA: Diagnosis not present

## 2021-03-20 DIAGNOSIS — N644 Mastodynia: Secondary | ICD-10-CM | POA: Diagnosis not present

## 2021-03-20 DIAGNOSIS — J439 Emphysema, unspecified: Secondary | ICD-10-CM | POA: Diagnosis not present

## 2021-03-20 DIAGNOSIS — R0602 Shortness of breath: Secondary | ICD-10-CM | POA: Diagnosis not present

## 2021-03-20 DIAGNOSIS — Z720 Tobacco use: Secondary | ICD-10-CM | POA: Diagnosis not present

## 2021-03-28 ENCOUNTER — Ambulatory Visit (INDEPENDENT_AMBULATORY_CARE_PROVIDER_SITE_OTHER): Payer: PPO

## 2021-03-28 ENCOUNTER — Other Ambulatory Visit: Payer: Self-pay

## 2021-03-28 ENCOUNTER — Ambulatory Visit (INDEPENDENT_AMBULATORY_CARE_PROVIDER_SITE_OTHER): Payer: PPO | Admitting: Sports Medicine

## 2021-03-28 DIAGNOSIS — M7121 Synovial cyst of popliteal space [Baker], right knee: Secondary | ICD-10-CM | POA: Diagnosis not present

## 2021-03-28 DIAGNOSIS — Z853 Personal history of malignant neoplasm of breast: Secondary | ICD-10-CM

## 2021-03-28 DIAGNOSIS — M533 Sacrococcygeal disorders, not elsewhere classified: Secondary | ICD-10-CM | POA: Diagnosis not present

## 2021-03-28 DIAGNOSIS — M545 Low back pain, unspecified: Secondary | ICD-10-CM

## 2021-03-28 DIAGNOSIS — M47816 Spondylosis without myelopathy or radiculopathy, lumbar region: Secondary | ICD-10-CM | POA: Diagnosis not present

## 2021-03-28 NOTE — Progress Notes (Addendum)
    Procedures performed today:    Procedure: Real-time Ultrasound Guided injection of the right sacroiliac joint Device: Samsung HS60  Verbal informed consent obtained.  Time-out conducted.  Noted no overlying erythema, induration, or other signs of local infection.  Skin prepped in a sterile fashion.  Local anesthesia: Topical Ethyl chloride.  With sterile technique and under real time ultrasound guidance:  Noted arthritic appearing SI joint, 1 cc Kenalog 40, 2 cc lidocaine, 2 cc bupivacaine injected easily Completed without difficulty  Advised to call if fevers/chills, erythema, induration, drainage, or persistent bleeding.  Images permanently stored and available for review in PACS.  Impression: Technically successful ultrasound guided injection.  Independent interpretation of notes and tests performed by another provider:   None.  Brief History, Exam, Impression, and Recommendations:    Acute right-sided low back pain Acute right-sided low back pain, referable to the sacroiliac joint today. This was injected with ultrasound guidance, home rehab given, x-rays obtained, return to see me in a month. Of note she did have a PET scan recently with focal increased uptake in the right iliac crest concerning for bony metastatic disease. If she does not get immediate dramatic relief from this injection then I would be more concerned that her right low back pain is from a bony metastatic focus.  Baker's cyst of knee Baker's cyst noted posterior right knee, minimally symptomatic so we will leave it alone for now, if worse at the follow-up visit for her SI joint/low back pain we will consider aspiration/injection.    ___________________________________________ Gwen Her. Dianah Field, M.D., ABFM., CAQSM. Primary Care and [place] Instructor of [employer] [facility] [employer]

## 2021-03-28 NOTE — Assessment & Plan Note (Signed)
Baker's cyst noted posterior right knee, minimally symptomatic so we will leave it alone for now, if worse at the follow-up visit for her SI joint/low back pain we will consider aspiration/injection.

## 2021-03-28 NOTE — Assessment & Plan Note (Addendum)
Acute right-sided low back pain, referable to the sacroiliac joint today. This was injected with ultrasound guidance, home rehab given, x-rays obtained, return to see me in a month. Of note she did have a PET scan recently with focal increased uptake in the right iliac crest concerning for bony metastatic disease. If she does not get immediate dramatic relief from this injection then I would be more concerned that her right low back pain is from a bony metastatic focus.

## 2021-04-12 DIAGNOSIS — R937 Abnormal findings on diagnostic imaging of other parts of musculoskeletal system: Secondary | ICD-10-CM | POA: Diagnosis not present

## 2021-04-12 DIAGNOSIS — M160 Bilateral primary osteoarthritis of hip: Secondary | ICD-10-CM | POA: Diagnosis not present

## 2021-04-12 DIAGNOSIS — C50911 Malignant neoplasm of unspecified site of right female breast: Secondary | ICD-10-CM | POA: Diagnosis not present

## 2021-04-12 DIAGNOSIS — Z853 Personal history of malignant neoplasm of breast: Secondary | ICD-10-CM | POA: Diagnosis not present

## 2021-04-13 ENCOUNTER — Encounter (HOSPITAL_BASED_OUTPATIENT_CLINIC_OR_DEPARTMENT_OTHER): Payer: Self-pay | Admitting: Urology

## 2021-04-13 ENCOUNTER — Emergency Department (HOSPITAL_BASED_OUTPATIENT_CLINIC_OR_DEPARTMENT_OTHER): Payer: PPO

## 2021-04-13 ENCOUNTER — Other Ambulatory Visit: Payer: Self-pay

## 2021-04-13 ENCOUNTER — Emergency Department (HOSPITAL_BASED_OUTPATIENT_CLINIC_OR_DEPARTMENT_OTHER)
Admission: EM | Admit: 2021-04-13 | Discharge: 2021-04-13 | Disposition: A | Discharge status: Home / Self Care | Payer: PPO | Attending: Emergency Medicine | Admitting: Emergency Medicine

## 2021-04-13 DIAGNOSIS — K5900 Constipation, unspecified: Secondary | ICD-10-CM | POA: Insufficient documentation

## 2021-04-13 DIAGNOSIS — J45909 Unspecified asthma, uncomplicated: Secondary | ICD-10-CM | POA: Diagnosis not present

## 2021-04-13 DIAGNOSIS — J449 Chronic obstructive pulmonary disease, unspecified: Secondary | ICD-10-CM | POA: Insufficient documentation

## 2021-04-13 DIAGNOSIS — Z87891 Personal history of nicotine dependence: Secondary | ICD-10-CM | POA: Insufficient documentation

## 2021-04-13 DIAGNOSIS — K5641 Fecal impaction: Secondary | ICD-10-CM | POA: Diagnosis not present

## 2021-04-13 DIAGNOSIS — Z7982 Long term (current) use of aspirin: Secondary | ICD-10-CM | POA: Insufficient documentation

## 2021-04-13 DIAGNOSIS — Z91010 Allergy to peanuts: Secondary | ICD-10-CM | POA: Diagnosis not present

## 2021-04-13 DIAGNOSIS — Z853 Personal history of malignant neoplasm of breast: Secondary | ICD-10-CM | POA: Insufficient documentation

## 2021-04-13 DIAGNOSIS — R911 Solitary pulmonary nodule: Secondary | ICD-10-CM | POA: Diagnosis not present

## 2021-04-13 MED ORDER — MAGNESIUM CITRATE PO SOLN
1.0000 | Freq: Once | ORAL | Status: AC
  Administered 2021-04-13: 1 via ORAL
  Filled 2021-04-13: qty 296

## 2021-04-13 NOTE — ED Provider Notes (Signed)
[facility] DEPT [facility] Provider Note: Anne Spurling, MD, FACEP  CSN: [id number] MRN: [id number] ARRIVAL: 04/13/21 at [place]: [place]  Constipation   HISTORY OF PRESENT ILLNESS  04/13/21 1:14 AM Anne [name] is a 80 y.o. female who reports having no bowel movement in the past 10 days.  She is no longer even passing gas.  She has tried Dulcolax suppository without relief.  She is having pain in her lower abdomen which she rates as a 10 out of 10.  She is also having pain in her rectal area.  She has had occasional nausea and vomiting with this.  On arrival she did have a bowel movement passing about 10 hard feces.  This resulted in significant relief of her pain.   She does not take any daily laxatives or stool softener.   Past Medical History:  Diagnosis Date  . ALKALINE PHOSPHATASE, ELEVATED 03/15/2009  . Allergic state 06/10/2012  . Allergy   . Anemia   . Anxiety and depression 04/28/2011  . Arthritis   . Asthma   . Atypical chest pain 11/30/2011  . 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 Baker's cyst of knee 05/22/2011  . Cancer ([place]) 01,  08   XRT/chemo 01-02/ lobular invasive ca  . Carotid artery disease ([place])    a. Carotid duplex 03/2014: stable 1-39% BICA, f/u due 03/2016.  [name] Chronic alcoholism in remission ([place]) 03/29/2011   Did not tolerate Klonopin, caused some confusion and bad dreams.    . Clotting disorder ([name])   . COPD (chronic obstructive pulmonary disease) ([place])   . Dermatitis 11/23/2012  . EE (eosinophilic esophagitis)   . Emphysema of lung ([place])   . Esophageal ring   . ESOPHAGEAL STRICTURE 03/29/2009  . Fall 11/23/2012  . Family history of breast cancer   . Family history of colon cancer   . Family history of ovarian cancer   . Family history of pancreatic cancer   . Folliculitis of nose 6/43/3295  . GERD (gastroesophageal reflux disease) 09/29/2009   improved s/p cholecystectomy and esophagus dilatation  .  History of chicken pox   . History of measles   . History of shingles    2 episodes  . Hx of echocardiogram    a. Echo 01/2013: mild LVH, EF 55-60%, normal wall motion, Gr 1 diast dysfn  . Hyperlipidemia   . Knee pain, bilateral 07/23/2011  . Medicare annual wellness visit, subsequent 06/19/2015  . Mixed hyperlipidemia 10/17/2010   Qualifier: Diagnosis of  By: Mack Guise    . Orthostasis   . Osteopenia 03/14/2011  . Osteoporosis   . Personal history of chemotherapy 2001  . Personal history of radiation therapy 2001   rt breast  . 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 PVC's (premature ventricular contractions)    a. Event monitor 01/2013: NSR, extensive PVCs.  . Radial neck fracture 10/2011   minimally displaced  . Substance abuse ([place])    In remission 3 years.   . Urinary incontinence 03/19/2012  . Vaginitis 05/22/2011    Past Surgical History:  Procedure Laterality Date  . APPENDECTOMY    . AUGMENTATION MAMMAPLASTY Right 05/27/2007  . BREAST BIOPSY Right 01/02/2007   wire loc  . BREAST BIOPSY  12/26/2006  . BREAST LUMPECTOMY Right 2001  . BREAST RECONSTRUCTION  2008, 2009, 2010  . BREAST REDUCTION WITH MASTOPEXY Left 05/30/2017   Procedure: LEFT BREAST REDUCTION FOR SYMTERY WITH MASTOPEXY;  Surgeon: Wallace Going, DO;  Location: [facility];  Service: Plastics;  Laterality: Left;  . CHOLECYSTECTOMY  2010  . COLONOSCOPY  09/05/10   cecal avm's  . DENTAL SURGERY  05/2016   4 dental implants by Dr. Loyal Gambler.  [name] ERCP  2010    CBD stone extraction   . ESOPHAGOGASTRODUODENOSCOPY  01/08/2012   Procedure: ESOPHAGOGASTRODUODENOSCOPY (EGD);  Surgeon: Gatha Mayer, MD;  Location: [facility];  Service: Endoscopy;  Laterality: N/A;  . ESOPHAGOGASTRODUODENOSCOPY (EGD) WITH ESOPHAGEAL DILATION  2010, 2012  . LAPAROSCOPIC APPENDECTOMY N/A 08/04/2016   Procedure: APPENDECTOMY LAPAROSCOPIC;  Surgeon: Michael Boston, MD;  Location: [facility];  Service: General;   Laterality: N/A;  . LIPOSUCTION WITH LIPOFILLING Left 11/21/2017   Procedure: LIPOSUCTION FROM ABDOMEN WITH LIPOFILLING TO LEFT BREAST;  Surgeon: Wallace Going, DO;  Location: [facility];  Service: Plastics;  Laterality: Left;  [name] MASTECTOMY MODIFIED RADICAL Right 05/27/2007   , Mastectomy modified radical (08), breast reconstruction, CA lesions excised lateral abd wall 2010  . MASTOPEXY Left 11/21/2017   Procedure: LEFT BREAST REVISION MASTOPEXY FOR SYMMETRY;  Surgeon: Wallace Going, DO;  Location: [facility];  Service: Plastics;  Laterality: Left;  . REDUCTION MAMMAPLASTY Left   . SAVORY DILATION  01/08/2012   Procedure: SAVORY DILATION;  Surgeon: Gatha Mayer, MD;  Location: [facility];  Service: Endoscopy;  Laterality: N/A;  need xray    Family History  Problem Relation Age of Onset  . Heart disease Father   . Lung cancer Father        smoker  . Cirrhosis Sister        Primary Biliary  . Stroke Maternal Grandmother   . Alcohol abuse Maternal Grandfather   . Heart disease Paternal Grandfather   . Esophageal cancer Paternal Grandfather   . Rectal cancer Paternal Grandfather   . Anxiety disorder Sister   . Osteoporosis Sister   . Arthritis Sister        Rheumatoid  . Osteoporosis Sister   . Skin cancer Sister        multiple skin cancers, over 50 excisions.  . Other Mother        tic [place]  . Breast cancer Maternal Aunt        dx in her 74s  . Breast cancer Paternal Aunt        dx in her 18s  . Pancreatic cancer Maternal Uncle        dx in his 58s; smoker  . Breast cancer Maternal Aunt        dx in her 17s  . Breast cancer Maternal Aunt        possible breast cancer dx and died in her 29s  . Ovarian cancer Maternal Aunt 29  . Pancreatic cancer Maternal Aunt        dx in her 52s  . Stomach cancer Maternal Uncle   . Colon cancer Maternal Uncle   . Lung cancer Paternal Aunt   . Breast cancer Cousin        paternal first  cousin  . Breast cancer Cousin        maternal first cousin  . Anesthesia problems Neg Hx   . Hypotension Neg Hx   . Malignant hyperthermia Neg Hx   . Pseudochol deficiency Neg Hx     Social History   Tobacco Use  . Smoking status: Former Smoker    Packs/day: 1.00  Years: 50.00    Pack years: 50.00    Types: Cigarettes    Quit date: 05/22/2007    Years since quitting: 13.9  . Smokeless tobacco: Never Used  Vaping Use  . Vaping Use: Never used  Substance Use Topics  . Alcohol use: Not Currently  . Drug use: No    Prior to Admission medications   Medication Sig Start Date End Date Taking? Authorizing Provider  acetaminophen (TYLENOL) 500 MG tablet Take 500-1,000 mg by mouth every 6 (six) hours as needed (for pain.).    [provider]  amitriptyline (ELAVIL) 25 MG tablet Take 6 tablets (150 mg total) by mouth at bedtime. 02/10/21   [name], MD  aspirin 81 MG EC tablet Take 81 mg by mouth daily.    [provider]  atorvastatin (LIPITOR) 20 MG tablet Take 1 tablet by mouth in the morning and at bedtime. 01/27/21   Lendon Colonel, NP  Biotin 5 MG CAPS Take by mouth.    [provider]  Cholecalciferol (VITAMIN D3) 1000 UNITS CAPS Take 2,000 Units by mouth daily.    [provider]  COVID-19 mRNA vaccine, Pfizer, 30 MCG/0.3ML injection AS DIRECTED 09/27/20 09/27/21  Carlyle Basques, MD  Cyanocobalamin (VITAMIN B 12 PO) Take 1 tablet by mouth daily.    [provider]  diltiazem (CARDIZEM) 30 MG tablet TAKE 1 TABLET BY MOUTH DAILY FOR PALPITATIONS 01/27/21   Lendon Colonel, NP  DULoxetine (CYMBALTA) 20 MG capsule Take 1 capsule (20 mg total) by mouth daily. 08/01/20   Truitt Merle, MD  Ferrous Fumarate-Folic Acid 425-9 MG TABS Take 1 tablet by mouth daily. 09/03/18   [name], MD  influenza vaccine adjuvanted (FLUAD) 0.5 ML injection TO BE ADMINISTERED AS DIRECTED 10/27/20 10/27/21  Carlyle Basques, MD  letrozole  [facility]) 2.5 MG tablet Take 1 tablet (2.5 mg total) by mouth daily. 06/16/20   Alla Feeling, NP  loratadine (CLARITIN) 10 MG tablet Take 1 tablet (10 mg total) by mouth daily. 04/29/18   [name], MD  LORazepam (ATIVAN) 1 MG tablet TAKE 1 TABLET(1 MG) BY MOUTH EVERY 8 HOURS AS NEEDED FOR ANXIETY 02/06/21   [name], MD  meclizine (ANTIVERT) 12.5 MG tablet Take 1 tablet by mouth as needed. 11/23/20   [provider]  meloxicam (MOBIC) 15 MG tablet One tab PO qAM with a meal for 2 weeks, then daily prn pain. 11/21/20   Silverio Decamp, MD  mupirocin cream (BACTROBAN) 2 % Apply 1 application topically 2 (two) times daily. 03/14/21   [name], MD  nystatin cream (MYCOSTATIN) Apply 1 application topically 2 (two) times daily. 03/03/21   Billie Ruddy, MD  palbociclib (IBRANCE) 125 MG tablet Take 1 tablet (125 mg total) by mouth daily. Take for 21 days on, 7 days off, repeat every 28 days. 06/19/20   Truitt Merle, MD  pantoprazole (PROTONIX) 40 MG tablet TAKE 1 TABLET(40 MG) BY MOUTH DAILY 02/03/21   [name], MD  rOPINIRole (REQUIP) 0.25 MG tablet Take 1 tablet (0.25 mg total) by mouth at bedtime. 03/06/20   [name], MD  traMADol (ULTRAM) 50 MG tablet Take 0.5-1 tablets (25-50 mg total) by mouth every 8 (eight) hours as needed. 08/01/20   Truitt Merle, MD  triamcinolone cream (KENALOG) 0.1 % Apply 1 application topically 2 (two) times daily. 02/25/21   Faustino Congress, NP    Allergies Codeine, Morphine, Peanut-containing drug products,  Sorbitol, Tomato, Zofran, Advil [ibuprofen], Diphenhydramine hcl, and Oxycodone   REVIEW OF SYSTEMS  Negative except as noted here or in the History of Present Illness.   PHYSICAL EXAMINATION  Initial Vital Signs Blood pressure (!) 152/89, pulse (!) 107, temperature 98.3 F (36.8 C), temperature source Oral, resp. rate 18, height 5\' 5"  (1.651 m), weight 59 kg, SpO2 93 %.  Examination General: Well-developed,  well-nourished female in no acute distress; appearance consistent with age of record HENT: normocephalic; atraumatic Eyes: pupils equal, round and reactive to light; extraocular muscles intact Neck: supple Heart: regular rate and rhythm Lungs: clear to auscultation bilaterally Abdomen: soft; nondistended; mild lower abdominal tenderness; bowel sounds present Rectal: Normal sphincter tone; no stool in vault Extremities: No deformity; full range of motion; pulses normal Neurologic: Awake, alert and oriented; motor function intact in all extremities and symmetric; no facial droop Skin: Warm and dry Psychiatric: Normal mood and affect   RESULTS  Summary of this visit's results, reviewed and interpreted by myself:   EKG Interpretation  Date/Time:    Ventricular Rate:    PR Interval:    QRS Duration:   QT Interval:    QTC Calculation:   R Axis:     Text Interpretation:        Laboratory Studies: No results found. However, due to the size of the patient record, not all encounters were searched. Please check Results Review for a complete set of results. Imaging Studies: CT ABDOMEN PELVIS WO CONTRAST  Result Date: 04/13/2021 CLINICAL DATA:  No bowel movement for 10 days, history of right breast cancer EXAM: CT ABDOMEN AND PELVIS WITHOUT CONTRAST TECHNIQUE: Multidetector CT imaging of the abdomen and pelvis was performed following the standard protocol without IV contrast. COMPARISON:  07/09/2020, 06/23/2020, 08/10/2017 FINDINGS: Lower chest: Postsurgical changes from right mastectomy. No acute pleural or parenchymal lung disease. The left-sided pleural base nodule seen on previous MRI is no longer identified. Hepatobiliary: No focal liver abnormality is seen. Status post cholecystectomy. No biliary dilatation. Pancreas: Unremarkable. No pancreatic ductal dilatation or surrounding inflammatory changes. Spleen: Normal in size without focal abnormality. Adrenals/Urinary Tract: No urinary tract  calculi or obstructive uropathy. The adrenals and bladder are unremarkable. Stomach/Bowel: There is no bowel obstruction or ileus. A large amount of retained stool is seen throughout the distal colon and rectum, consistent with constipation. No evidence of fecal impaction. No bowel wall thickening or inflammatory change. The appendix is surgically absent. Vascular/Lymphatic: Aortic atherosclerosis. No enlarged abdominal or pelvic lymph nodes. Reproductive: Uterus and bilateral adnexa are unremarkable. Other: No free fluid or free gas.  No abdominal wall hernia. Musculoskeletal: No acute or destructive bony lesions. Reconstructed images demonstrate no additional findings. IMPRESSION: 1. Significant retained stool throughout the distal colon and rectum, consistent with constipation. No evidence of fecal impaction. 2. No bowel obstruction or ileus. 3. Resolution of the 4 mm left pleural nodule seen on previous MRI. 4.  Aortic Atherosclerosis (ICD10-I70.0). Electronically Signed   By: Randa Ngo M.D.   On: 04/13/2021 01:51    ED COURSE and MDM  Nursing notes, initial and subsequent vitals signs, including pulse oximetry, reviewed and interpreted by myself.  Vitals:   04/13/21 0042 04/13/21 0044  BP:  (!) 152/89  Pulse:  (!) 107  Resp:  18  Temp:  98.3 F (36.8 C)  TempSrc:  Oral  SpO2:  93%  Weight: 59 kg   Height: 5\' 5"  (1.651 m)    Medications  magnesium citrate solution 1 Bottle (  has no administration in time range)   Patient advised of CT findings showing constipation but no blockage, ileus or impaction.  We will give her a dose of mag citrate and advised her to begin using MiraLAX daily.   PROCEDURES  Procedures   ED DIAGNOSES     ICD-10-CM   1. Constipation, unspecified constipation type  K59.00        Azim Gillingham, Jenny Reichmann, MD 04/13/21 [phone]

## 2021-04-13 NOTE — ED Triage Notes (Signed)
Pt states no BM x 10 days, no longer passing gas.  States tried dulcolax supp at home with no relief

## 2021-04-27 ENCOUNTER — Ambulatory Visit (INDEPENDENT_AMBULATORY_CARE_PROVIDER_SITE_OTHER): Payer: PPO | Admitting: Sports Medicine

## 2021-04-27 ENCOUNTER — Other Ambulatory Visit: Payer: Self-pay

## 2021-04-27 ENCOUNTER — Ambulatory Visit (INDEPENDENT_AMBULATORY_CARE_PROVIDER_SITE_OTHER): Payer: PPO

## 2021-04-27 DIAGNOSIS — M545 Low back pain, unspecified: Secondary | ICD-10-CM | POA: Diagnosis not present

## 2021-04-27 DIAGNOSIS — M1611 Unilateral primary osteoarthritis, right hip: Secondary | ICD-10-CM | POA: Diagnosis not present

## 2021-04-27 NOTE — Assessment & Plan Note (Signed)
X-rays, CT, and MRI all showed primary osteoarthritis in the right hip joint, she has pain in the groin worse with internal rotation She is hesitant to try other oral medications and conditioning exercises have resulted in increased discomfort, proceeding with right hip joint injection today, return to see me in a month.

## 2021-04-27 NOTE — Assessment & Plan Note (Signed)
Acute right-sided SI joint pain has resolved after right-sided sacroiliac joint injection at the last visit, she was having some increased PET scan focal uptake in the right iliac crest concerning for bony metastatic disease however a follow-up hip MRI and a CT scan did not show any evidence of metastatic disease. She is doing well from an SI joint standpoint.

## 2021-04-27 NOTE — Progress Notes (Signed)
    Procedures performed today:    Procedure: Real-time Ultrasound Guided injection of the right hip joint Device: Samsung [id number]  Verbal informed consent obtained.  Time-out conducted.  Noted no overlying erythema, induration, or other signs of local infection.  Skin prepped in a sterile fashion.  Local anesthesia: Topical Ethyl chloride.  With sterile technique and under real time ultrasound guidance: Noted arthritic hip joint, 1 cc Kenalog 40, 2 cc lidocaine, 2 cc bupivacaine injected easily Completed without difficulty  Advised to call if fevers/chills, erythema, induration, drainage, or persistent bleeding.  Images permanently stored and available for review in PACS.  Impression: Technically successful ultrasound guided injection.  Independent interpretation of notes and tests performed by another provider:   None.  Brief History, Exam, Impression, and Recommendations:    Acute right-sided low back pain Acute right-sided SI joint pain has resolved after right-sided sacroiliac joint injection at the last visit, she was having some increased PET scan focal uptake in the right iliac crest concerning for bony metastatic disease however a follow-up hip MRI and a CT scan did not show any evidence of metastatic disease. She is doing well from an SI joint standpoint.  Primary osteoarthritis of right hip X-rays, CT, and MRI all showed primary osteoarthritis in the right hip joint, she has pain in the groin worse with internal rotation She is hesitant to try other oral medications and conditioning exercises have resulted in increased discomfort, proceeding with right hip joint injection today, return to see me in a month.    ___________________________________________ Gwen Her. Dianah Field, M.D., ABFM., CAQSM. Primary Care and [place] Instructor of [place] [employer]

## 2021-05-03 ENCOUNTER — Other Ambulatory Visit: Payer: Self-pay | Admitting: Family Medicine

## 2021-05-03 DIAGNOSIS — S299XXA Unspecified injury of thorax, initial encounter: Secondary | ICD-10-CM

## 2021-05-03 NOTE — Telephone Encounter (Addendum)
Requesting: lorazepam 1mg  Contract: 02/07/2021 UDS: 02/07/2021 Last Visit: 02/07/2021 Next Visit:  None Last Refill: 02/06/2021 #70 and 1RF  Please Advise

## 2021-05-09 ENCOUNTER — Other Ambulatory Visit: Payer: Self-pay | Admitting: Oncology

## 2021-05-09 DIAGNOSIS — Z1231 Encounter for screening mammogram for malignant neoplasm of breast: Secondary | ICD-10-CM

## 2021-05-26 DIAGNOSIS — G939 Disorder of brain, unspecified: Secondary | ICD-10-CM | POA: Diagnosis not present

## 2021-05-26 DIAGNOSIS — D7389 Other diseases of spleen: Secondary | ICD-10-CM | POA: Diagnosis not present

## 2021-05-26 DIAGNOSIS — C50911 Malignant neoplasm of unspecified site of right female breast: Secondary | ICD-10-CM | POA: Diagnosis not present

## 2021-05-29 DIAGNOSIS — C50911 Malignant neoplasm of unspecified site of right female breast: Secondary | ICD-10-CM | POA: Diagnosis not present

## 2021-06-01 ENCOUNTER — Ambulatory Visit: Payer: PPO | Admitting: Sports Medicine

## 2021-06-02 ENCOUNTER — Ambulatory Visit
Admission: RE | Admit: 2021-06-02 | Discharge: 2021-06-02 | Disposition: A | Discharge status: Home / Self Care | Payer: PPO | Source: Ambulatory Visit | Attending: Oncology | Admitting: Oncology

## 2021-06-02 ENCOUNTER — Other Ambulatory Visit: Payer: Self-pay

## 2021-06-02 DIAGNOSIS — Z1231 Encounter for screening mammogram for malignant neoplasm of breast: Secondary | ICD-10-CM

## 2021-06-09 DIAGNOSIS — C50911 Malignant neoplasm of unspecified site of right female breast: Secondary | ICD-10-CM | POA: Diagnosis not present

## 2021-06-10 ENCOUNTER — Encounter: Payer: Self-pay | Admitting: Family Medicine

## 2021-06-15 ENCOUNTER — Ambulatory Visit: Payer: PPO | Admitting: Nurse Practitioner

## 2021-06-18 NOTE — Telephone Encounter (Signed)
PM please advise. Thanks   [place], Anne R "Torie"  P [facility] Pool .08.06.22  Dr. 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  Recently, I have developed an occasional dry cough and would like your diagnosis of this symptom. For over year I have experienced pain in the area of my artificial breast; Dr. Rip Harbour, my oncologist at [facility], ordered a MRI which found nothing at all.  I still have the occasional pain and wonder if the radiation fibrosis could be the cause.  I am unable to endure your  breathing drill which I have done at least twice and prefer a test. I haven't mentioned the dry cough to Dr. Rip Harbour, as the dry cough started about ten days ago.  The last time I saw her was July 18th.  And this issue isn't in her field.     Thank you for your time.  Anne "Torie" [place]  [phone]

## 2021-06-19 ENCOUNTER — Telehealth: Payer: Self-pay | Admitting: Family Medicine

## 2021-06-21 ENCOUNTER — Ambulatory Visit: Payer: PPO | Admitting: Sports Medicine

## 2021-06-24 ENCOUNTER — Encounter: Payer: Self-pay | Admitting: Emergency Medicine

## 2021-06-24 ENCOUNTER — Other Ambulatory Visit: Payer: Self-pay

## 2021-06-24 ENCOUNTER — Emergency Department
Admission: EM | Admit: 2021-06-24 | Discharge: 2021-06-24 | Disposition: A | Discharge status: Home / Self Care | Payer: PPO | Source: Home / Self Care

## 2021-06-24 DIAGNOSIS — B86 Scabies: Secondary | ICD-10-CM

## 2021-06-24 DIAGNOSIS — R21 Rash and other nonspecific skin eruption: Secondary | ICD-10-CM

## 2021-06-24 MED ORDER — PERMETHRIN 5 % EX CREA: TOPICAL_CREAM | CUTANEOUS | 1 refills | Status: DC

## 2021-06-24 NOTE — ED Provider Notes (Signed)
[name]    CSN: [id number] Arrival date & time: 06/24/21  1155      History   Chief Complaint Chief Complaint  Patient presents with  . Rash    HPI Anne [name] is a 80 y.o. female.   HPI 80 year old female presents with a rash that woke her up on Wednesday night of this past week believes this is possible scabies, with rash of bilateral lower extremities right arm and left chest  Past Medical History:  Diagnosis Date  . ALKALINE PHOSPHATASE, ELEVATED 03/15/2009  . Allergic state 06/10/2012  . Allergy   . Anemia   . Anxiety and depression 04/28/2011  . Arthritis   . Asthma   . Atypical chest pain 11/30/2011  . 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 Baker's cyst of knee 05/22/2011  . Cancer ([place]) 01,  08   XRT/chemo 01-02/ lobular invasive ca  . Carotid artery disease ([place])    a. Carotid duplex 03/2014: stable 1-39% BICA, f/u due 03/2016.  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    . Clotting disorder ([place])   . COPD (chronic obstructive pulmonary disease) ([place])   . Dermatitis 11/23/2012  . EE (eosinophilic esophagitis)   . Emphysema of lung ([place])   . Esophageal ring   . ESOPHAGEAL STRICTURE 03/29/2009  . Fall 11/23/2012  . Family history of breast cancer   . Family history of colon cancer   . Family history of ovarian cancer   . Family history of pancreatic cancer   . Folliculitis of nose [id number]  . GERD (gastroesophageal reflux disease) 09/29/2009   improved s/p cholecystectomy and esophagus dilatation  . History of chicken pox   . History of measles   . History of shingles    2 episodes  . Hx of echocardiogram    a. Echo 01/2013: mild LVH, EF 55-60%, normal wall motion, Gr 1 diast dysfn  . Hyperlipidemia   . Knee pain, bilateral 07/23/2011  . Medicare annual wellness visit, subsequent 06/19/2015  . Mixed hyperlipidemia 10/17/2010   Qualifier: Diagnosis  of  By: Mack Guise    . Orthostasis   . Osteopenia 03/14/2011  . Osteoporosis   . Personal history of chemotherapy 2001  . Personal history of radiation therapy 2001   rt breast  . 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 PVC's (premature ventricular contractions)    a. Event monitor 01/2013: NSR, extensive PVCs.  . Radial neck fracture 10/2011   minimally displaced  . Substance abuse ([place])    In remission 3 years.   . Urinary incontinence 03/19/2012  . Vaginitis 05/22/2011    Patient Active Problem List   Diagnosis Date Noted  . Primary osteoarthritis of right hip 04/27/2021  . Insomnia 12/28/2020  . Vertigo 12/28/2020  . Emphysema lung ([place]) 06/06/2020  . RLS (restless legs syndrome) 02/24/2020  . 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 Facial trauma, sequela 08/09/2019  . Hyperglycemia 08/19/2018  . Mild intermittent asthma without complication [id number]  . Asthma 04/30/2018  . 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 Palpitations 04/30/2018  . Skin lesion of face 02/07/2018  . Acute right-sided low back pain 09/23/2017  . Hypokalemia 08/11/2017  . Abdominal pain 08/11/2017  . SBO (small bowel obstruction) ([place]) 08/10/2017  . History of right breast cancer 03/21/2017  . Genetic testing 09/13/2016  . Family history of breast cancer   .  Family history of pancreatic cancer   . Family history of colon cancer   . Pain in the chest 05/16/2016  . Status post right breast reconstruction 05/11/2016  . Breast cancer 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 ([place]) 05/11/2016  . History of breast cancer in female 05/11/2016  . Osteopenia determined by x-ray 05/11/2016  . IBS (irritable bowel syndrome) 03/09/2016  . Dyspnea 06/19/2015  . Medicare annual wellness visit, subsequent 06/19/2015  . Urinary frequency 12/12/2014  . Breast cancer, right breast ([place]) 10/18/2014  . Superficial thrombophlebitis 03/16/2014  . Plant dermatitis 03/16/2014  . Chest wall pain 02/07/2014  . Primary  osteoarthritis of both hips 01/21/2014  . Elevated sed rate 08/02/2013  . Dermatitis 11/23/2012  . Falls 11/23/2012  . Preventative health care 10/21/2012  . Allergy 06/10/2012  . Urinary incontinence 03/19/2012  . Anemia 12/21/2011  . Knee pain, bilateral 07/23/2011  . Baker's cyst of knee 05/22/2011  . Eosinophilic esophagitis [id number]  . Anxiety and depression 04/28/2011  . 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 Constipation, chronic 03/15/2011  . Mixed hyperlipidemia 10/17/2010  . CAROTID ARTERY STENOSIS 01/13/2010  . GERD 09/29/2009  . PERSONAL HX BREAST CANCER 09/29/2009    Past Surgical History:  Procedure Laterality Date  . APPENDECTOMY    . AUGMENTATION MAMMAPLASTY Right 05/27/2007  . BREAST BIOPSY Right 01/02/2007   wire loc  . BREAST BIOPSY  12/26/2006  . BREAST LUMPECTOMY Right 2001  . BREAST RECONSTRUCTION  2008, 2009, 2010  . BREAST REDUCTION WITH MASTOPEXY Left 05/30/2017   Procedure: LEFT BREAST REDUCTION FOR SYMTERY WITH MASTOPEXY;  Surgeon: Wallace Going, DO;  Location: [facility];  Service: Plastics;  Laterality: Left;  . CHOLECYSTECTOMY  2010  . COLONOSCOPY  09/05/10   cecal avm's  . DENTAL SURGERY  05/2016   4 dental implants by Dr. Loyal Gambler.  [name] ERCP  2010    CBD stone extraction   . ESOPHAGOGASTRODUODENOSCOPY  01/08/2012   Procedure: ESOPHAGOGASTRODUODENOSCOPY (EGD);  Surgeon: Gatha Mayer, MD;  Location: [facility];  Service: Endoscopy;  Laterality: N/A;  . ESOPHAGOGASTRODUODENOSCOPY (EGD) WITH ESOPHAGEAL DILATION  2010, 2012  . LAPAROSCOPIC APPENDECTOMY N/A 08/04/2016   Procedure: APPENDECTOMY LAPAROSCOPIC;  Surgeon: Asuna Peth [place], MD;  Location: [facility];  Service: General;  Laterality: N/A;  . LIPOSUCTION WITH LIPOFILLING Left 11/21/2017   Procedure: LIPOSUCTION FROM ABDOMEN WITH LIPOFILLING TO LEFT BREAST;  Surgeon: Wallace Going, DO;  Location: [facility];  Service: Plastics;  Laterality:  Left;  [name] MASTECTOMY MODIFIED RADICAL Right 05/27/2007   , Mastectomy modified radical (08), breast reconstruction, CA lesions excised lateral abd wall 2010  . MASTOPEXY Left 11/21/2017   Procedure: LEFT BREAST REVISION MASTOPEXY FOR SYMMETRY;  Surgeon: Wallace Going, DO;  Location: [facility];  Service: Plastics;  Laterality: Left;  . REDUCTION MAMMAPLASTY Left   . SAVORY DILATION  01/08/2012   Procedure: SAVORY DILATION;  Surgeon: Gatha Mayer, MD;  Location: [facility];  Service: Endoscopy;  Laterality: N/A;  need xray    OB History   No obstetric history on file.      Home Medications    Prior to Admission medications   Medication Sig Start Date End Date Taking? Authorizing Provider  permethrin (ELIMITE) 5 % cream Apply to affected area once, leave on for 8 to 14 hours then rinse completely, may repeat in 7 to 10 days if live mites are still present. 06/24/21  Yes Eliezer Lofts, FNP  acetaminophen (TYLENOL)  500 MG tablet Take 500-1,000 mg by mouth every 6 (six) hours as needed (for pain.).    [provider]  amitriptyline (ELAVIL) 25 MG tablet Take 6 tablets (150 mg total) by mouth at bedtime. 02/10/21   [name], MD  aspirin 81 MG EC tablet Take 81 mg by mouth daily.    [provider]  atorvastatin (LIPITOR) 20 MG tablet Take 1 tablet by mouth in the morning and at bedtime. 01/27/21   Lendon Colonel, NP  Biotin 5 MG CAPS Take by mouth.    [provider]  Cholecalciferol (VITAMIN D3) 1000 UNITS CAPS Take 2,000 Units by mouth daily.    [provider]  Cyanocobalamin (VITAMIN B 12 PO) Take 1 tablet by mouth daily.    [provider]  diltiazem (CARDIZEM) 30 MG tablet TAKE 1 TABLET BY MOUTH DAILY FOR PALPITATIONS 01/27/21   Lendon Colonel, NP  DULoxetine (CYMBALTA) 20 MG capsule Take 1 capsule (20 mg total) by mouth daily. 08/01/20   Truitt Merle, MD  Ferrous Fumarate-Folic Acid 99991111 MG TABS Take 1 tablet  by mouth daily. 09/03/18   [name], MD  influenza vaccine adjuvanted (FLUAD) 0.5 ML injection TO BE ADMINISTERED AS DIRECTED 10/27/20 10/27/21  Carlyle Basques, MD  letrozole [facility]) 2.5 MG tablet Take 1 tablet (2.5 mg total) by mouth daily. 06/16/20   Alla Feeling, NP  loratadine (CLARITIN) 10 MG tablet Take 1 tablet (10 mg total) by mouth daily. 04/29/18   [name], MD  LORazepam (ATIVAN) 1 MG tablet TAKE 1 TABLET(1 MG) BY MOUTH EVERY 8 HOURS AS NEEDED FOR ANXIETY 05/03/21   [name], MD  meclizine (ANTIVERT) 12.5 MG tablet Take 1 tablet by mouth as needed. Patient not taking: Reported on 06/24/2021 11/23/20   [provider]  meloxicam (MOBIC) 15 MG tablet One tab PO qAM with a meal for 2 weeks, then daily prn pain. 11/21/20   Silverio Decamp, MD  mupirocin cream (BACTROBAN) 2 % Apply 1 application topically 2 (two) times daily. 03/14/21   [name], MD  nystatin cream (MYCOSTATIN) Apply 1 application topically 2 (two) times daily. 03/03/21   Billie Ruddy, MD  palbociclib (IBRANCE) 125 MG tablet Take 1 tablet (125 mg total) by mouth daily. Take for 21 days on, 7 days off, repeat every 28 days. 06/19/20   Truitt Merle, MD  pantoprazole (PROTONIX) 40 MG tablet TAKE 1 TABLET(40 MG) BY MOUTH DAILY 02/03/21   [name], MD  rOPINIRole (REQUIP) 0.25 MG tablet Take 1 tablet (0.25 mg total) by mouth at bedtime. Patient not taking: Reported on 06/24/2021 03/06/20   [name], MD  traMADol (ULTRAM) 50 MG tablet Take 0.5-1 tablets (25-50 mg total) by mouth every 8 (eight) hours as needed. 08/01/20   Truitt Merle, MD  triamcinolone cream (KENALOG) 0.1 % Apply 1 application topically 2 (two) times daily. 02/25/21   Faustino Congress, NP    Family History Family History  Problem Relation Age of Onset  . Heart disease Father   . Lung cancer Father        smoker  . Cirrhosis Sister        Primary Biliary  . Stroke Maternal Grandmother   . Alcohol abuse  Maternal Grandfather   . Heart disease Paternal Grandfather   . Esophageal cancer Paternal Grandfather   . Rectal cancer Paternal Grandfather   . Anxiety disorder Sister   . Osteoporosis Sister   .  Arthritis Sister        Rheumatoid  . Osteoporosis Sister   . Skin cancer Sister        multiple skin cancers, over 2 excisions.  . Other Mother        tic [place]  . Breast cancer Maternal Aunt        dx in her 26s  . Breast cancer Paternal Aunt        dx in her 69s  . Pancreatic cancer Maternal Uncle        dx in his 38s; smoker  . Breast cancer Maternal Aunt        dx in her 12s  . Breast cancer Maternal Aunt        possible breast cancer dx and died in her 41s  . Ovarian cancer Maternal Aunt 29  . Pancreatic cancer Maternal Aunt        dx in her 61s  . Stomach cancer Maternal Uncle   . Colon cancer Maternal Uncle   . Lung cancer Paternal Aunt   . Breast cancer Cousin        paternal first cousin  . Breast cancer Cousin        maternal first cousin  . Anesthesia problems Neg Hx   . Hypotension Neg Hx   . Malignant hyperthermia Neg Hx   . Pseudochol deficiency Neg Hx     Social History Social History   Tobacco Use  . Smoking status: Former    Packs/day: 1.00    Years: 50.00    Pack years: 50.00    Types: Cigarettes    Quit date: 05/22/2007    Years since quitting: 14.1  . Smokeless tobacco: Never  Vaping Use  . Vaping Use: Never used  Substance Use Topics  . Alcohol use: Not Currently  . Drug use: No     Allergies   Codeine, Morphine, Peanut-containing drug products, Sorbitol, Tomato, Zofran, Advil [ibuprofen], Diphenhydramine hcl, and Oxycodone   Review of Systems Review of Systems  Skin:  Positive for rash.    Physical Exam Triage Vital Signs ED Triage Vitals  Enc Vitals Group     BP 06/24/21 1222 119/90     Pulse Rate 06/24/21 1222 99     Resp 06/24/21 1222 16     Temp 06/24/21 1222 98.5 F (36.9 C)     Temp Source 06/24/21 1222 Oral      SpO2 06/24/21 1222 94 %     Weight 06/24/21 1227 127 lb (57.6 kg)     Height --      Head Circumference --      Peak Flow --      Pain Score 06/24/21 1226 3     Pain Loc --      Pain Edu? --      Excl. in [place]? --    No data found.  Updated Vital Signs BP 119/90 (BP Location: Right Arm)   Pulse 99   Temp 98.5 F (36.9 C) (Oral)   Resp 16   Wt 127 lb (57.6 kg)   SpO2 94%   BMI 21.13 kg/m    Physical Exam Vitals and nursing note reviewed.  Constitutional:      General: She is not in acute distress.    Appearance: Normal appearance. She is normal weight. She is not ill-appearing.  HENT:     Head: Normocephalic and atraumatic.     Right Ear: Tympanic membrane, ear canal and external ear normal.  Left Ear: Tympanic membrane, ear canal and external ear normal.     Nose: Nose normal.     Mouth/Throat:     Mouth: Mucous membranes are moist.     Pharynx: Oropharynx is clear.  Eyes:     Extraocular Movements: Extraocular movements intact.     Conjunctiva/sclera: Conjunctivae normal.     Pupils: Pupils are equal, round, and reactive to light.  Cardiovascular:     Rate and Rhythm: Normal rate and regular rhythm.     Pulses: Normal pulses.     Heart sounds: Normal heart sounds.  Pulmonary:     Effort: Pulmonary effort is normal.     Breath sounds: Normal breath sounds. No wheezing, rhonchi or rales.  Musculoskeletal:        General: Normal range of motion.     Cervical back: Normal range of motion and neck supple. No tenderness.  Lymphadenopathy:     Cervical: No cervical adenopathy.  Skin:    General: Skin is warm and dry.     Comments: Bilateral lower legs, bilateral lower arms: Erythematous, excoriated papules, including multiple interdigit papules noted of toes  Neurological:     General: No focal deficit present.     Mental Status: She is alert and oriented to person, place, and time. Mental status is at baseline.  Psychiatric:        Mood and Affect: Mood normal.         Behavior: Behavior normal.        Thought Content: Thought content normal.     UC Treatments / Results  Labs (all labs ordered are listed, but only abnormal results are displayed) Labs Reviewed - No data to display  EKG   Radiology No results found.  Procedures Procedures (including critical care time)  Medications Ordered in UC Medications - No data to display  Initial Impression / Assessment and Plan / UC Course  I have reviewed the triage vital signs and the nursing notes.  Pertinent labs & imaging results that were available during my care of the patient were reviewed by me and considered in my medical decision making (see chart for details).     MDM: 1.  Rash and nonspecific skin eruption; 2.  Scabies-Rx'd Permethrin.  Advised/instructed patient to use medication as directed.  Patient discharged home, hemodynamically stable. Final Clinical Impressions(s) / UC Diagnoses   Final diagnoses:  Rash and nonspecific skin eruption  Scabies     Discharge Instructions      Advised patient to use medication as directed.     ED Prescriptions     Medication Sig Dispense Auth. Provider   permethrin (ELIMITE) 5 % cream Apply to affected area once, leave on for 8 to 14 hours then rinse completely, may repeat in 7 to 10 days if live mites are still present. 60 g Eliezer Lofts, FNP      PDMP not reviewed this encounter.   Eliezer Lofts, [place] 06/24/21 1248

## 2021-06-24 NOTE — Discharge Instructions (Addendum)
Advised patient to use medication as directed.

## 2021-06-24 NOTE — ED Triage Notes (Signed)
Rash -possible scabies per pt - woke her up on wed night Areas noted too bilat lower extremities & R arm  left chest   No new chemo meds  [facility] pt hem-onc Scabies in past  Benadryl cream last night helped

## 2021-06-26 ENCOUNTER — Ambulatory Visit: Payer: PPO | Admitting: Nurse Practitioner

## 2021-06-28 NOTE — Telephone Encounter (Signed)
Sorry for the delay in response as I was away. 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 This has not changed over the years.   We will make an appointment to see me as you are due for a follow up visit anyway.

## 2021-07-03 ENCOUNTER — Ambulatory Visit: Payer: PPO

## 2021-07-08 NOTE — Telephone Encounter (Signed)
This encounter was created in error - please disregard.

## 2021-07-20 ENCOUNTER — Other Ambulatory Visit: Payer: Self-pay | Admitting: Family Medicine

## 2021-07-20 DIAGNOSIS — S299XXA Unspecified injury of thorax, initial encounter: Secondary | ICD-10-CM

## 2021-07-21 NOTE — Telephone Encounter (Signed)
Requesting:ativan 1 mg Contract:02/07/21 UDS:02/07/21 Last Visit:02/07/21 Next Visit:unknown Last Refill:05/03/21  Please Advise

## 2021-07-24 NOTE — Telephone Encounter (Signed)
PM this is a message that the pt sent back:  I attempted to contact you on August 6.  No response.  I saw where my message was read, nothing else.  About two weeks ago I called your office to see if you still worked there, and I was told "yes." I am nothing to you but an old ugly worthless woman.   Fine doctor you are.   [place]

## 2021-07-25 ENCOUNTER — Encounter: Payer: Self-pay | Admitting: *Deleted

## 2021-07-25 NOTE — Telephone Encounter (Signed)
Triage- can you please send this message via Mychart and by regular mail. Thanks  Ms Heffler,  I tried calling your phone number today but got the voice mail. I left a message requesting call back. Please let me know a time when you are available and I can call back We will also need to schedule a clinic visit.   Below is the message I sent via my Chart in August in response to your first query. I apologize if you did not get it and am not sure why it did not come through in my Chart.  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 This has not changed over the years.    We will make an appointment to see me as you are due for a follow up visit anyway.    Please call the office at [phone] and they can schedule that appointment for you."  Marshell Garfinkel MD [facility] 07/25/2021, 8:16 AM

## 2021-07-25 NOTE — Telephone Encounter (Signed)
I have sent Dr Matilde Bash reply via Deloris Ping and also mailed letter to her home address.

## 2021-07-26 ENCOUNTER — Encounter: Payer: Self-pay | Admitting: Physician Assistant

## 2021-07-26 ENCOUNTER — Ambulatory Visit: Payer: PPO | Admitting: Physician Assistant

## 2021-07-26 VITALS — BP 122/70 | HR 92 | Ht 65.0 in | Wt 127.0 lb

## 2021-07-26 DIAGNOSIS — R1310 Dysphagia, unspecified: Secondary | ICD-10-CM | POA: Diagnosis not present

## 2021-07-26 DIAGNOSIS — Z8719 Personal history of other diseases of the digestive system: Secondary | ICD-10-CM | POA: Diagnosis not present

## 2021-07-26 DIAGNOSIS — K200 Eosinophilic esophagitis: Secondary | ICD-10-CM

## 2021-07-26 NOTE — Progress Notes (Signed)
Subjective:    Patient ID: Anne [name], female    DOB: 09-27-1941, 80 y.o.   MRN: [id number]  HPI Anne [name] is a pleasant 80 year old white female, established with Dr. Leone Payor.  She comes in today with recurrent complaints of dysphagia. 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  She had had this diagnosis previously however biopsies did show some mild acute and chronic inflammation but were negative for increased eosinophilia. Patient stays on chronic pantoprazole 40 mg daily for GERD symptoms.  She says that the esophageal dilations always help her symptoms quite a bit and that she had been doing well until over the past couple of months.  Patient says she was in the room on vacation about a month ago and had an episode of transient food impaction while she was eating at a restaurant.  She was finally able to get up and regurgitate and get the food out.  Since that time she has had 2 other episodes which were similar.  She says she thought she was going to need to go to the ER with 1 of these but eventually was able to get the food to move and regurgitated.  She is having less severe daily symptoms, and has been cutting her food into small bites, and eating very slowly.  She says she is avoiding certain foods and has not been eating meat over the past couple of years due to prior issues with dysphagia.  She denies any odynophagia, and denies any heartburn or indigestion, no abdominal pain. Last colonoscopy was done in 2019 for iron deficiency anemia, she was found to have 7 nonbleeding AVMs which were not treated at that time, and 2 colon polyps the largest was 8  mm and a very redundant colon.  There was also 1 small lipoma noted.  Path on the polyps was consistent with tubular adenomas.  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  She says she is being followed at [facility] and currently on treatment with letrozole and her disease has been stable.  Also with history of early dementia, anxiety/depression.  Last echo 2021 EF of 60 to 65%.  Review of Systems. Pertinent positive and negative review of systems were noted in the above HPI section.  All other review of systems was otherwise negative.   Outpatient Encounter Medications as of 07/26/2021  Medication Sig   acetaminophen (TYLENOL) 500 MG tablet Take 500-1,000 mg by mouth every 6 (six) hours as needed (for pain.).   amitriptyline (ELAVIL) 25 MG tablet Take 6 tablets (150 mg total) by mouth at bedtime.   aspirin 81 MG EC tablet Take 81 mg by mouth daily.   atorvastatin (LIPITOR) 20 MG tablet Take 1 tablet by mouth in the morning and at bedtime.   Biotin 5 MG CAPS Take by mouth.   Cholecalciferol (VITAMIN D3) 1000 UNITS CAPS Take 2,000 Units by mouth daily.   Cyanocobalamin (VITAMIN B 12 PO) Take 1 tablet by mouth daily.   diltiazem (CARDIZEM) 30 MG tablet TAKE 1  TABLET BY MOUTH DAILY FOR PALPITATIONS   DULoxetine (CYMBALTA) 20 MG capsule Take 1 capsule (20 mg total) by mouth daily.   Ferrous Fumarate-Folic Acid 324-1 MG TABS Take 1 tablet by mouth daily.   influenza vaccine adjuvanted (FLUAD) 0.5 ML injection TO BE ADMINISTERED AS DIRECTED   letrozole (FEMARA) 2.5 MG tablet Take 1 tablet (2.5 mg total) by mouth daily.   loratadine (CLARITIN) 10 MG tablet Take 1 tablet (10 mg total) by mouth daily.   LORazepam (ATIVAN) 1 MG tablet TAKE 1 TABLET(1 MG) BY MOUTH EVERY 8 HOURS AS NEEDED FOR ANXIETY   meclizine (ANTIVERT) 12.5 MG tablet Take 1 tablet by  mouth as needed.   meloxicam (MOBIC) 15 MG tablet One tab PO qAM with a meal for 2 weeks, then daily prn pain.   mupirocin cream (BACTROBAN) 2 % Apply 1 application topically 2 (two) times daily.   nystatin cream (MYCOSTATIN) Apply 1 application topically 2 (two) times daily.   palbociclib (IBRANCE) 125 MG tablet Take 1 tablet (125 mg total) by mouth daily. Take for 21 days on, 7 days off, repeat every 28 days.   pantoprazole (PROTONIX) 40 MG tablet TAKE 1 TABLET(40 MG) BY MOUTH DAILY   permethrin (ELIMITE) 5 % cream Apply to affected area once, leave on for 8 to 14 hours then rinse completely, may repeat in 7 to 10 days if live mites are still present.   rOPINIRole (REQUIP) 0.25 MG tablet Take 1 tablet (0.25 mg total) by mouth at bedtime.   traMADol (ULTRAM) 50 MG tablet Take 0.5-1 tablets (25-50 mg total) by mouth every 8 (eight) hours as needed.   triamcinolone cream (KENALOG) 0.1 % Apply 1 application topically 2 (two) times daily.   No facility-administered encounter medications on file as of 07/26/2021.   Allergies  Allergen Reactions   Codeine Other (See Comments)    "flu like symptoms" Causes depression   Morphine Other (See Comments)    "flu like symptoms"   Peanut-Containing Drug Products Hives   Sorbitol Other (See Comments)    GI Issues   Tomato Diarrhea   Zofran Other (See Comments)    headache   Advil [Ibuprofen] Other (See Comments)    Irritates throat.   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   Facial trauma, sequela 08/09/2019   Hyperglycemia 08/19/2018   Mild intermittent asthma  without complication 06/17/2018   Asthma 04/30/2018   Macular degeneration, 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   Palpitations 04/30/2018   Skin lesion of face 02/07/2018   Acute right-sided low back pain 09/23/2017   Hypokalemia 08/11/2017   Abdominal pain 08/11/2017   SBO (small bowel obstruction) (HCC) 08/10/2017   History of right breast cancer 03/21/2017   Genetic testing 09/13/2016   Family history of breast cancer    Family history of pancreatic cancer    Family history of colon cancer    Pain in the chest 05/16/2016   Status post right breast reconstruction 05/11/2016   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) 05/11/2016   History of breast cancer in female 05/11/2016   Osteopenia determined by x-ray 05/11/2016  IBS (irritable bowel syndrome) 03/09/2016   Dyspnea 06/19/2015   Medicare annual wellness visit, subsequent 06/19/2015   Urinary frequency 12/12/2014   Breast cancer, right breast (HCC) 10/18/2014   Superficial thrombophlebitis 03/16/2014   Plant dermatitis 03/16/2014   Chest wall pain 02/07/2014   Primary osteoarthritis of both hips 01/21/2014   Elevated sed rate 08/02/2013   Dermatitis 11/23/2012   Falls 11/23/2012   Preventative health care 10/21/2012   Allergy 06/10/2012   Urinary incontinence 03/19/2012   Anemia 12/21/2011   Knee pain, bilateral 07/23/2011   Baker's cyst of knee 05/22/2011   Eosinophilic esophagitis 05/11/2011   Anxiety and depression 04/28/2011   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   Constipation, chronic 03/15/2011   Mixed hyperlipidemia 10/17/2010   CAROTID ARTERY STENOSIS 01/13/2010   GERD 09/29/2009   PERSONAL HX BREAST CANCER 09/29/2009   Social History   Socioeconomic History   Marital status: Married    Spouse name: Anne [name]   Number of children: 3   Years of education: Not on file   Highest education level: Not on file  Occupational History   Occupation: Arboriculturist  Tobacco Use   Smoking status: Former    Packs/day: 1.00     Years: 50.00    Pack years: 50.00    Types: Cigarettes    Quit date: 05/22/2007    Years since quitting: 14.1   Smokeless tobacco: Never  Vaping Use   Vaping Use: Never used  Substance and Sexual Activity   Alcohol use: Not Currently   Drug use: No   Sexual activity: Yes    Partners: Male    Comment: lives with husband,   Other Topics Concern   Not on file  Social History Narrative   Not on file   Social Determinants of Health   Financial Resource Strain: Not on file  Food Insecurity: Not on file  Transportation Needs: Not on file  Physical Activity: Not on file  Stress: Not on file  Social Connections: Not on file  Intimate Partner Violence: Not on file    [name]'s family history includes Alcohol abuse in her maternal grandfather; Anxiety disorder in her sister; Arthritis in her sister; Breast cancer in her cousin, cousin, maternal aunt, maternal aunt, maternal aunt, and paternal aunt; Cirrhosis in her sister; Colon cancer in her maternal uncle; Esophageal cancer in her paternal grandfather; Heart disease in her father and paternal grandfather; Lung cancer in her father and paternal aunt; Osteoporosis in her sister and sister; Other in her mother; Ovarian cancer (age of onset: 41) in her maternal aunt; Pancreatic cancer in her maternal aunt and maternal uncle; Rectal cancer in her paternal grandfather; Skin cancer in her sister; Stomach cancer in her maternal uncle; Stroke in her maternal grandmother.      Objective:    Vitals:   07/26/21 1435  BP: 122/70  Pulse: 92  SpO2: 97%    Physical Exam Well-developed well-nourished elderly white female in no acute distress.  Pleasant height, Weight, 127 BMI 21.1  HEENT; nontraumatic normocephalic, EOMI, PE R LA, sclera anicteric. Oropharynx; not examined today Neck; supple, no JVD Cardiovascular; regular rate and rhythm with S1-S2, no murmur rub or gallop Pulmonary; Clear bilaterally Abdomen; soft, nontender, nondistended,  no palpable mass or hepatosplenomegaly, bowel sounds are active Rectal; not done today Skin; benign exam, no jaundice rash or appreciable lesions Extremities; no clubbing cyanosis or edema skin warm and dry Neuro/Psych; alert and oriented x4, grossly nonfocal mood and affect appropriate  Assessment & Plan:   #64 80 year old white female with previous diagnosis of eosinophilic esophagitis, and previous history of esophageal stricture/Schatzki's ring requiring dilation who presents with recurrent solid food dysphagia over the past 2 months.  She has had 3 distinct episodes of transient food impactions requiring regurgitation and is having milder symptoms on a daily basis. No odynophagia to suggest active eosinophilic esophagitis. Biopsies at the time of last EGD in October 2020 showed mild acute and chronic inflammation of the esophagus but were negative for EOE.  #2 previous history of iron deficiency anemia likely secondary to colonic AVMs documented at colonoscopy 2019 3.  History of adenomatous colon polyps-last colonoscopy 2019 with removal of 2 polyps largest 8 mm. #4 metastatic breast cancer/states for with metastatic disease to the dermis, on letrozole #5  COPD, no O2 use #6.  Carotid stenosis on baby aspirin #7.  IBS #8.  Anxiety/depression #9 mild dementia  Plan; continue Protonix 40 mg p.o. every morning AC breakfast Patient will be scheduled for EGD with Dr. Leone Payor with esophageal dilation.  Procedure was discussed in detail with the patient including indications risk and benefits and she is agreeable to proceed. She always avoids meats, we discussed eating very slowly, cutting food into very small pieces and sipping fluids between bites in the interim until EGD is done.   Deantae Shackleton Oswald Hillock PA-C 07/26/2021   Cc: Bradd Canary, MD

## 2021-07-26 NOTE — Patient Instructions (Signed)
If you are age 79 or older, your body mass index should be between 23-30. Your Body mass index is 21.13 kg/m. If this is out of the aforementioned range listed, please consider follow up with your Primary Care Provider. __________________________________________________________  The [facility] providers would like to encourage you to use [facility] to communicate with providers for non-urgent requests or questions.  Due to long hold times on the telephone, sending your provider a message by [facility] may be a faster and more efficient way to get a response.  Please allow 48 business hours for a response.  Please remember that this is for non-urgent requests.   You have been scheduled for an endoscopy. Please follow written instructions given to you at your visit today. If you use inhalers (even only as needed), please bring them with you on the day of your procedure.  Continue Pantoprazole 40 mg 1 tablet daily.  Follow up pending the results of your Endoscopy.  Thank you for entrusting me with your care and choosing [facility].  Amy Esterwood, PA-C

## 2021-08-01 ENCOUNTER — Other Ambulatory Visit: Payer: Self-pay | Admitting: Nurse Practitioner

## 2021-08-01 ENCOUNTER — Other Ambulatory Visit: Payer: Self-pay | Admitting: Adult Health

## 2021-08-01 DIAGNOSIS — R42 Dizziness and giddiness: Secondary | ICD-10-CM | POA: Diagnosis not present

## 2021-08-01 DIAGNOSIS — Z79899 Other long term (current) drug therapy: Secondary | ICD-10-CM | POA: Diagnosis not present

## 2021-08-01 DIAGNOSIS — Z9189 Other specified personal risk factors, not elsewhere classified: Secondary | ICD-10-CM | POA: Diagnosis not present

## 2021-08-01 DIAGNOSIS — G893 Neoplasm related pain (acute) (chronic): Secondary | ICD-10-CM | POA: Diagnosis not present

## 2021-08-01 DIAGNOSIS — R21 Rash and other nonspecific skin eruption: Secondary | ICD-10-CM | POA: Diagnosis not present

## 2021-08-01 DIAGNOSIS — Z853 Personal history of malignant neoplasm of breast: Secondary | ICD-10-CM | POA: Diagnosis not present

## 2021-08-01 DIAGNOSIS — Z08 Encounter for follow-up examination after completed treatment for malignant neoplasm: Secondary | ICD-10-CM | POA: Diagnosis not present

## 2021-08-01 DIAGNOSIS — C50911 Malignant neoplasm of unspecified site of right female breast: Secondary | ICD-10-CM | POA: Diagnosis not present

## 2021-08-01 DIAGNOSIS — Z79811 Long term (current) use of aromatase inhibitors: Secondary | ICD-10-CM | POA: Diagnosis not present

## 2021-08-01 DIAGNOSIS — D7389 Other diseases of spleen: Secondary | ICD-10-CM | POA: Diagnosis not present

## 2021-08-01 DIAGNOSIS — M8580 Other specified disorders of bone density and structure, unspecified site: Secondary | ICD-10-CM | POA: Diagnosis not present

## 2021-08-09 ENCOUNTER — Other Ambulatory Visit: Payer: Self-pay | Admitting: Family Medicine

## 2021-08-29 ENCOUNTER — Encounter: Payer: Self-pay | Admitting: Internal Medicine

## 2021-08-29 ENCOUNTER — Ambulatory Visit (AMBULATORY_SURGERY_CENTER): Payer: PPO | Admitting: Internal Medicine

## 2021-08-29 VITALS — BP 112/64 | HR 77 | Temp 96.0°F | Resp 19 | Ht 65.0 in | Wt 127.0 lb

## 2021-08-29 DIAGNOSIS — K222 Esophageal obstruction: Secondary | ICD-10-CM | POA: Diagnosis not present

## 2021-08-29 DIAGNOSIS — R1310 Dysphagia, unspecified: Secondary | ICD-10-CM

## 2021-08-29 DIAGNOSIS — Z8719 Personal history of other diseases of the digestive system: Secondary | ICD-10-CM

## 2021-08-29 DIAGNOSIS — K200 Eosinophilic esophagitis: Secondary | ICD-10-CM

## 2021-08-29 MED ORDER — SODIUM CHLORIDE 0.9 % IV SOLN: 500.0000 mL | Freq: Once | INTRAVENOUS | Status: DC

## 2021-08-29 MED ORDER — PANTOPRAZOLE SODIUM 40 MG PO TBEC: 40.0000 mg | DELAYED_RELEASE_TABLET | Freq: Two times a day (BID) | ORAL | 3 refills | Status: DC

## 2021-08-29 NOTE — Op Note (Signed)
[facility] Patient Name: Anne [name] Procedure Date: 08/29/2021 2:09 PM MRN: [id number] Endoscopist: Gatha Mayer , MD Age: 80 Referring MD:  Date of Birth: 03-26-1941 Gender: Female Account #: [id number] Procedure:                Upper GI endoscopy Indications:              Dysphagia, Stricture of the esophagus, For therapy                            of esophageal stricture, Eosinophilic esophagitis Medicines:                Propofol per Anesthesia, Monitored Anesthesia Care Procedure:                Pre-Anesthesia Assessment:                           - Prior to the procedure, a History and Physical                            was performed, and patient medications and                            allergies were reviewed. The patient's tolerance of                            previous anesthesia was also reviewed. The risks                            and benefits of the procedure and the sedation                            options and risks were discussed with the patient.                            All questions were answered, and informed consent                            was obtained. Prior Anticoagulants: The patient has                            taken no previous anticoagulant or antiplatelet                            agents. ASA Grade Assessment: III - A patient with                            severe systemic disease. After reviewing the risks                            and benefits, the patient was deemed in                            satisfactory condition to undergo the procedure.  After obtaining informed consent, the endoscope was                            passed under direct vision. Throughout the                            procedure, the patient's blood pressure, pulse, and                            oxygen saturations were monitored continuously. The                            GIF [id number] #[id number] was introduced through the                             mouth, and advanced to the antrum of the stomach.                            The upper GI endoscopy was accomplished without                            difficulty. The patient tolerated the procedure                            well. Scope In: 2:33:33 PM Scope Out: 2:41:28 PM Total Procedure Duration: 0 hours 7 minutes 55 seconds  Findings:                 A severe Schatzki ring was found at the                            gastroesophageal junction. A TTS dilator was passed                            through the scope. Dilation with a 12-13.5-15 mm                            balloon dilator was performed to 15 mm. The                            dilation site was examined and showed moderate                            mucosal disruption. Estimated blood loss was                            minimal.                           Mucosal changes including longitudinal furrows and                            circumferential folds were found in the entire  esophagus.                           One benign-appearing, intrinsic mild                            (non-circumferential scarring) stenosis was found                            at the cricopharyngeus. The stenosis was traversed.                            dilated via scope insertion - dilation effect seen                            upon exit                           The cardia, gastric fundus, gastric body and                            gastric antrum were normal.                           The cardia and gastric fundus were normal on                            retroflexion. Complications:            No immediate complications. Estimated Blood Loss:     Estimated blood loss was minimal. Impression:               - Severe Schatzki ring. Dilated.                           - Esophageal mucosal changes secondary to                            eosinophilic esophagitis.                           -  Benign-appearing esophageal stenosis.                           - Normal cardia, gastric fundus, gastric body and                            antrum.                           - No specimens collected. Recommendation:           - Patient has a contact number available for                            emergencies. The signs and symptoms of potential                            delayed complications  were discussed with the                            patient. Return to normal activities tomorrow.                            Written discharge instructions were provided to the                            patient.                           - Clear liquids x 1 hour then soft foods rest of                            day. Start prior diet tomorrow.                           - Continue present medications.                           - Repeat upper endoscopy PRN for retreatment.                           - CHANGE PANTOPRAZOLE TO 40 MG BID Gatha Mayer, MD 08/29/2021 2:55:18 PM This report has been signed electronically.

## 2021-08-29 NOTE — Patient Instructions (Addendum)
I dilated the esophagus again.  I also sent a new pantoprazole prescription to your pharmacy. I am increasing that to twice a day in hopes that it will reduce the need to have dilations.  I appreciate the opportunity to care for you. Gatha Mayer, MD, FACG  YOU HAD AN ENDOSCOPIC PROCEDURE TODAY AT [facility]:   Refer to the procedure report that was given to you for any specific questions about what was found during the examination.  If the procedure report does not answer your questions, please call your gastroenterologist to clarify.  If you requested that your care partner not be given the details of your procedure findings, then the procedure report has been included in a sealed envelope for you to review at your convenience later.  YOU SHOULD EXPECT: Some feelings of bloating in the abdomen. Passage of more gas than usual.  Walking can help get rid of the air that was put into your GI tract during the procedure and reduce the bloating. If you had a lower endoscopy (such as a colonoscopy or flexible sigmoidoscopy) you may notice spotting of blood in your stool or on the toilet paper. If you underwent a bowel prep for your procedure, you may not have a normal bowel movement for a few days.  Please Note:  You might notice some irritation and congestion in your nose or some drainage.  This is from the oxygen used during your procedure.  There is no need for concern and it should clear up in a day or so.  SYMPTOMS TO REPORT IMMEDIATELY:   Following upper endoscopy (EGD)  Vomiting of blood or coffee ground material  New chest pain or pain under the shoulder blades  Painful or persistently difficult swallowing  New shortness of breath  Fever of 100F or higher  Black, tarry-looking stools  For urgent or emergent issues, a gastroenterologist can be reached at any hour by calling [phone]. Do not use MyChart messaging for urgent concerns.    DIET:  Clear liquids  until 3:45 today then soft diet for the rest of the day. Tomorrow you may proceed to your regular diet.  Drink plenty of fluids but you should avoid alcoholic beverages for 24 hours.  ACTIVITY:  You should plan to take it easy for the rest of today and you should NOT DRIVE or use heavy machinery until tomorrow (because of the sedation medicines used during the test).    FOLLOW UP: Our staff will call the number listed on your records 48-72 hours following your procedure to check on you and address any questions or concerns that you may have regarding the information given to you following your procedure. If we do not reach you, we will leave a message.  We will attempt to reach you two times.  During this call, we will ask if you have developed any symptoms of COVID 19. If you develop any symptoms (ie: fever, flu-like symptoms, shortness of breath, cough etc.) before then, please call [phone].  If you test positive for Covid 19 in the 2 weeks post procedure, please call and report this information to [place].    If any biopsies were taken you will be contacted by phone or by letter within the next 1-3 weeks.  Please call us at [phone] if you have not heard about the biopsies in 3 weeks.    SIGNATURES/CONFIDENTIALITY: You and/or your care partner have signed paperwork which will be entered into your electronic  medical record.  These signatures attest to the fact that that the information above on your After Visit Summary has been reviewed and is understood.  Full responsibility of the confidentiality of this discharge information lies with you and/or your care-partner.  

## 2021-08-29 NOTE — Progress Notes (Signed)
Called to room to assist during endoscopic procedure.  Patient ID and intended procedure confirmed with present staff. Received instructions for my participation in the procedure from the performing physician.  

## 2021-08-29 NOTE — Progress Notes (Signed)
DT VS 

## 2021-08-29 NOTE — Progress Notes (Signed)
Pt Drowsy. VSS. To PACU, report to RN. No anesthetic complications noted.  

## 2021-08-29 NOTE — Progress Notes (Signed)
[facility] History and Physical   Primary Care Physician:  [name], MD   Reason for Procedure:   dysphagia  Plan:    Upper endoscopy with esophageal dilation     HPI: Anne [name] is a 80 y.o. female w/ hx GERD and EoE here with recurrent dysphagia. Last EGD dilation was in 2020. 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 Carotid duplex 03/2014: stable 1-39% BICA, f/u due 03/2016.   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     Clotting disorder (HCC)    COPD (chronic obstructive pulmonary disease) (HCC)    Dermatitis 01/31/2247   EE (eosinophilic esophagitis)    Emphysema of lung (HCC)    Esophageal ring    ESOPHAGEAL STRICTURE 03/29/2009   Fall 11/23/2012   Family history of breast cancer    Family history of colon cancer    Family history of ovarian cancer    Family history of pancreatic cancer    Folliculitis of nose 2/50/0370   GERD (gastroesophageal reflux disease) 09/29/2009   improved s/p cholecystectomy and esophagus dilatation   History of chicken pox    History of measles    History of shingles    2 episodes   Hx of echocardiogram    a. Echo 01/2013: mild LVH, EF 55-60%, normal wall motion, Gr 1 diast dysfn   Hyperlipidemia    Knee pain, bilateral 07/23/2011   Medicare annual wellness visit, subsequent 06/19/2015   Mixed hyperlipidemia 10/17/2010   Qualifier: Diagnosis of  By: Mack Guise     Orthostasis    Osteopenia 03/14/2011   Osteoporosis     Personal history of chemotherapy 2001   Personal history of radiation therapy 2001   rt breast   PERSONAL HX BREAST CANCER 09/29/2009   PVC's (premature ventricular contractions)    a. Event monitor 01/2013: NSR, extensive PVCs.   Radial neck fracture 10/2011   minimally displaced   Substance abuse ([place])    In remission 3 years.    Urinary incontinence 03/19/2012   Vaginitis 05/22/2011    Past Surgical History:  Procedure Laterality Date   APPENDECTOMY     AUGMENTATION MAMMAPLASTY Right 05/27/2007   BREAST BIOPSY Right 01/02/2007   wire loc   BREAST BIOPSY  12/26/2006   BREAST LUMPECTOMY Right 2001   BREAST RECONSTRUCTION  2008, 2009, 2010   BREAST REDUCTION WITH MASTOPEXY Left 05/30/2017   Procedure: LEFT BREAST REDUCTION FOR SYMTERY WITH MASTOPEXY;  Surgeon: Wallace Going, DO;  Location: [facility];  Service: Plastics;  Laterality: Left;   CHOLECYSTECTOMY  2010   COLONOSCOPY  09/05/10   cecal avm's   DENTAL SURGERY  05/2016   4 dental implants by Dr. Loyal Gambler.   ERCP  2010    CBD stone extraction    ESOPHAGOGASTRODUODENOSCOPY  01/08/2012   Procedure: ESOPHAGOGASTRODUODENOSCOPY (EGD);  Surgeon: Gatha Mayer, MD;  Location: [facility];  Service: Endoscopy;  Laterality: N/A;   ESOPHAGOGASTRODUODENOSCOPY (EGD) WITH ESOPHAGEAL DILATION  2010, 2012   LAPAROSCOPIC APPENDECTOMY N/A 08/04/2016   Procedure: APPENDECTOMY LAPAROSCOPIC;  Surgeon: Michael Boston, MD;  Location: [facility];  Service: General;  Laterality: N/A;   LIPOSUCTION WITH LIPOFILLING Left 11/21/2017   Procedure: LIPOSUCTION FROM ABDOMEN WITH LIPOFILLING TO LEFT BREAST;  Surgeon: Wallace Going, DO;  Location: [facility];  Service: Plastics;  Laterality: Left;   MASTECTOMY MODIFIED RADICAL Right 05/27/2007   , Mastectomy modified radical (08), breast reconstruction, CA lesions excised lateral abd wall 2010   MASTOPEXY Left 11/21/2017   Procedure: LEFT BREAST REVISION MASTOPEXY FOR  SYMMETRY;  Surgeon: Wallace Going, DO;  Location: [facility];  Service: Plastics;  Laterality: Left;   REDUCTION MAMMAPLASTY Left    SAVORY DILATION  01/08/2012   Procedure: SAVORY DILATION;  Surgeon: Gatha Mayer, MD;  Location: [facility];  Service: Endoscopy;  Laterality: N/A;  need xray    Prior to Admission medications   Medication Sig Start Date End Date Taking? Authorizing Provider  acetaminophen (TYLENOL) 500 MG tablet Take 500-1,000 mg by mouth every 6 (six) hours as needed (for pain.).   Yes [provider]  amitriptyline (ELAVIL) 25 MG tablet Take 6 tablets (150 mg total) by mouth at bedtime. 02/10/21  Yes [name], MD  atorvastatin (LIPITOR) 20 MG tablet TAKE 1 TABLET BY MOUTH IN THE MORNING AND AT BEDTIME 08/01/21  Yes Lendon Colonel, NP  Biotin 5 MG CAPS Take by mouth.   Yes [provider]  Cholecalciferol (VITAMIN D3) 1000 UNITS CAPS Take 2,000 Units by mouth daily.   Yes [provider]  Cyanocobalamin (VITAMIN B 12 PO) Take 1 tablet by mouth daily.   Yes [provider]  diltiazem (CARDIZEM) 30 MG tablet TAKE 1 TABLET BY MOUTH DAILY FOR PALPITATIONS 01/27/21  Yes Lendon Colonel, NP  Ferrous Fumarate-Folic Acid 440-3 MG TABS Take 1 tablet by mouth daily. 09/03/18  Yes [name], MD  letrozole [facility]) 2.5 MG tablet Take 1 tablet (2.5 mg total) by mouth daily. 06/16/20  Yes Alla Feeling, NP  loratadine (CLARITIN) 10 MG tablet Take 1 tablet (10 mg total) by mouth daily. 04/29/18  Yes [name], MD  LORazepam (ATIVAN) 1 MG tablet TAKE 1 TABLET(1 MG) BY MOUTH EVERY 8 HOURS AS NEEDED FOR ANXIETY 07/21/21  Yes [name], MD  pantoprazole (PROTONIX) 40 MG tablet TAKE 1 TABLET(40 MG) BY MOUTH DAILY 08/09/21  Yes [name], MD  traMADol (ULTRAM) 50 MG tablet Take 0.5-1 tablets (25-50 mg total) by mouth every 8 (eight) hours as needed. 08/01/20  Yes Truitt Merle, MD  aspirin 81 MG EC tablet Take 81  mg by mouth daily.    [provider]  Docusate Sodium (DSS) 100 MG CAPS Take by mouth.    [provider]  influenza vaccine adjuvanted (FLUAD) 0.5 ML injection TO BE ADMINISTERED AS DIRECTED Patient not taking: Reported on 08/29/2021 10/27/20 10/27/21  Carlyle Basques, MD  meclizine (ANTIVERT) 12.5 MG tablet Take 1 tablet by mouth as needed. 11/23/20   [provider]  meloxicam (MOBIC) 15 MG tablet One tab PO qAM with a meal for 2 weeks, then daily prn pain. 11/21/20   Silverio Decamp, MD  mupirocin cream (BACTROBAN) 2 % Apply 1 application topically 2 (two) times daily. 03/14/21   [name], MD  nystatin cream (MYCOSTATIN) Apply 1  application topically 2 (two) times daily. 03/03/21   Billie Ruddy, MD  permethrin (ELIMITE) 5 % cream Apply to affected area once, leave on for 8 to 14 hours then rinse completely, may repeat in 7 to 10 days if live mites are still present. 06/24/21   Eliezer Lofts, FNP  rOPINIRole (REQUIP) 0.25 MG tablet Take 1 tablet (0.25 mg total) by mouth at bedtime. 03/06/20   [name], MD  triamcinolone cream (KENALOG) 0.1 % Apply 1 application topically 2 (two) times daily. 02/25/21   Faustino Congress, NP    Current Outpatient Medications  Medication Sig Dispense Refill   acetaminophen (TYLENOL) 500 MG tablet Take 500-1,000 mg by mouth every 6 (six) hours as needed (for pain.).     amitriptyline (ELAVIL) 25 MG tablet Take 6 tablets (150 mg total) by mouth at bedtime. 180 tablet 5   atorvastatin (LIPITOR) 20 MG tablet TAKE 1 TABLET BY MOUTH IN THE MORNING AND AT BEDTIME 180 tablet 0   Biotin 5 MG CAPS Take by mouth.     Cholecalciferol (VITAMIN D3) 1000 UNITS CAPS Take 2,000 Units by mouth daily.     Cyanocobalamin (VITAMIN B 12 PO) Take 1 tablet by mouth daily.     diltiazem (CARDIZEM) 30 MG tablet TAKE 1 TABLET BY MOUTH DAILY FOR PALPITATIONS 90 tablet 3   Ferrous Fumarate-Folic Acid 374-8 MG TABS Take 1 tablet by mouth  daily. 30 each 3   letrozole (FEMARA) 2.5 MG tablet Take 1 tablet (2.5 mg total) by mouth daily. 90 tablet 3   loratadine (CLARITIN) 10 MG tablet Take 1 tablet (10 mg total) by mouth daily. 30 tablet 11   LORazepam (ATIVAN) 1 MG tablet TAKE 1 TABLET(1 MG) BY MOUTH EVERY 8 HOURS AS NEEDED FOR ANXIETY 70 tablet 1   pantoprazole (PROTONIX) 40 MG tablet TAKE 1 TABLET(40 MG) BY MOUTH DAILY 90 tablet 0   traMADol (ULTRAM) 50 MG tablet Take 0.5-1 tablets (25-50 mg total) by mouth every 8 (eight) hours as needed. 20 tablet 0   aspirin 81 MG EC tablet Take 81 mg by mouth daily.     Docusate Sodium (DSS) 100 MG CAPS Take by mouth.     influenza vaccine adjuvanted (FLUAD) 0.5 ML injection TO BE ADMINISTERED AS DIRECTED (Patient not taking: Reported on 08/29/2021) .5 mL 0   meclizine (ANTIVERT) 12.5 MG tablet Take 1 tablet by mouth as needed.     meloxicam (MOBIC) 15 MG tablet One tab PO qAM with a meal for 2 weeks, then daily prn pain. 30 tablet 3   mupirocin cream (BACTROBAN) 2 % Apply 1 application topically 2 (two) times daily. 15 g 0   nystatin cream (MYCOSTATIN) Apply 1 application topically 2 (two) times daily. 30 g 0   permethrin (ELIMITE) 5 % cream Apply to affected area once, leave on for 8 to 14 hours then rinse completely, may repeat in 7 to 10 days if live mites are still present. 60 g 1   rOPINIRole (REQUIP) 0.25 MG tablet Take 1 tablet (0.25 mg total) by mouth at bedtime. 30 tablet 0   triamcinolone cream (KENALOG) 0.1 % Apply 1 application topically 2 (two) times daily. 30 g 0   Current Facility-Administered Medications  Medication Dose Route Frequency Provider Last Rate Last Admin   0.9 %  sodium chloride infusion  500 mL Intravenous Once Gatha Mayer, MD        Allergies as of 08/29/2021 - Review Complete 08/29/2021  Allergen  Reaction Noted   Codeine Other (See Comments) 04/02/2016   Morphine Other (See Comments) 04/02/2016   Peanut-containing drug products Hives 06/10/2012    Sorbitol Other (See Comments) 07/06/2015   Tomato Diarrhea 05/31/2014   Zofran Other (See Comments) 07/07/2004   Advil [ibuprofen] Other (See Comments) 05/20/2017   Cephalexin Other (See Comments) 11/29/2017   Diphenhydramine hcl Other (See Comments)    Morphine and related Other (See Comments) 08/23/2020   Antihistamines, chlorpheniramine-type Anxiety 06/05/2019   Oxycodone Anxiety 06/07/2017    Family History  Problem Relation Age of Onset   Heart disease Father    Lung cancer Father        smoker   Cirrhosis Sister        Primary Biliary   Stroke Maternal Grandmother    Alcohol abuse Maternal Grandfather    Heart disease Paternal Grandfather    Esophageal cancer Paternal Grandfather    Rectal cancer Paternal Grandfather    Anxiety disorder Sister    Osteoporosis Sister    Arthritis Sister        Rheumatoid   Osteoporosis Sister    Skin cancer Sister        multiple skin cancers, over 35 excisions.   Other Mother        tic [place]   Breast cancer Maternal Aunt        dx in her 51s   Breast cancer Paternal Aunt        dx in her 29s   Pancreatic cancer Maternal Uncle        dx in his 64s; smoker   Breast cancer Maternal Aunt        dx in her 70s   Breast cancer Maternal Aunt        possible breast cancer dx and died in her 30s   Ovarian cancer Maternal Aunt 29   Pancreatic cancer Maternal Aunt        dx in her 22s   Stomach cancer Maternal Uncle    Colon cancer Maternal Uncle    Lung cancer Paternal Aunt    Breast cancer Cousin        paternal first cousin   Breast cancer Cousin        maternal first cousin   Anesthesia problems Neg Hx    Hypotension Neg Hx    Malignant hyperthermia Neg Hx    Pseudochol deficiency Neg Hx     Social History   Socioeconomic History   Marital status: Married    Spouse name: Sam   Number of children: 3   Years of education: Not on file   Highest education level: Not on file  Occupational History   Occupation:  Social worker  Tobacco Use   Smoking status: Former    Packs/day: 1.00    Years: 50.00    Pack years: 50.00    Types: Cigarettes    Quit date: 05/22/2007    Years since quitting: 14.2   Smokeless tobacco: Never  Vaping Use   Vaping Use: Never used  Substance and Sexual Activity   Alcohol use: Not Currently   Drug use: No   Sexual activity: Yes    Partners: Male    Comment: lives with husband,   Other Topics Concern   Not on file  Social History Narrative   Not on file   Social Determinants of Health   Financial Resource Strain: Not on file  Food Insecurity: Not on file  Transportation Needs: Not on file  Physical Activity: Not on file  Stress: Not on file  Social Connections: Not on file  Intimate Partner Violence: Not on file    Review of Systems:  All other review of systems negative except as mentioned in the HPI.  Physical Exam: Vital signs BP 115/68   Pulse 89   Temp (!) 96 F (35.6 C)   Ht 5\' 5"  (1.651 m)   Wt 127 lb (57.6 kg)   SpO2 99%   BMI 21.13 kg/m   General:   Alert,  Well-developed, well-nourished, pleasant and cooperative in NAD Lungs:  Clear throughout to auscultation.   Heart:  Regular rate and rhythm; no murmurs, clicks, rubs,  or gallops. Abdomen:  Soft, nontender and nondistended. Normal bowel sounds.   Neuro/Psych:  Alert and cooperative. Normal mood and affect. A and O x 3   @Urvi Imes  Simonne Maffucci, MD, [facility] [phone] (pager) 08/29/2021 2:20 PM@

## 2021-08-31 ENCOUNTER — Telehealth: Payer: Self-pay

## 2021-08-31 ENCOUNTER — Telehealth: Payer: Self-pay | Admitting: *Deleted

## 2021-08-31 NOTE — Telephone Encounter (Signed)
  Follow up Call-  Call back number 08/29/2021 08/31/2019  Post procedure Call Back phone  # [phone] cell [phone]  Permission to leave phone message Yes Yes  Some recent data might be hidden    LMOM to call back with any questions or concerns.  Also, call back if patient has developed fever, respiratory issues or been dx with COVID or had any family members or close contacts diagnosed since her procedure.

## 2021-08-31 NOTE — Telephone Encounter (Signed)
Attempted f/u call. No answer, left VM. 

## 2021-09-05 ENCOUNTER — Other Ambulatory Visit: Payer: Self-pay | Admitting: Family Medicine

## 2021-09-11 ENCOUNTER — Telehealth: Payer: Self-pay | Admitting: Family Medicine

## 2021-09-11 NOTE — Telephone Encounter (Signed)
Left message for patient to call back and schedule Medicare Annual Wellness Visit (AWV) in office.   If not able to come in office, please offer to do virtually or by telephone.  Left office number and my jabber [phone].  Last AWV:03/17/2018  Please schedule at anytime with Nurse Health Advisor.

## 2021-09-22 ENCOUNTER — Ambulatory Visit (INDEPENDENT_AMBULATORY_CARE_PROVIDER_SITE_OTHER): Payer: PPO

## 2021-09-22 ENCOUNTER — Ambulatory Visit (INDEPENDENT_AMBULATORY_CARE_PROVIDER_SITE_OTHER): Payer: PPO | Admitting: Sports Medicine

## 2021-09-22 ENCOUNTER — Other Ambulatory Visit: Payer: Self-pay

## 2021-09-22 DIAGNOSIS — M160 Bilateral primary osteoarthritis of hip: Secondary | ICD-10-CM

## 2021-09-22 NOTE — Progress Notes (Signed)
    Procedures performed today:    Procedure: Real-time Ultrasound Guided injection of the right hip joint Device: Samsung HS60  Verbal informed consent obtained.  Time-out conducted.  Noted no overlying erythema, induration, or other signs of local infection.  Skin prepped in a sterile fashion.  Local anesthesia: Topical Ethyl chloride.  With sterile technique and under real time ultrasound guidance: Noted arthritic joint, I advanced a 22-gauge spinal needle to the femoral head/neck junction, contacted bone and then injected 1 cc Kenalog 40, 2 cc lidocaine, 2 cc bupivacaine injected easily Completed without difficulty  Advised to call if fevers/chills, erythema, induration, drainage, or persistent bleeding.  Images permanently stored and available for review in PACS.  Impression: Technically successful ultrasound guided injection.  Independent interpretation of notes and tests performed by another provider:   None.  Brief History, Exam, Impression, and Recommendations:    Primary osteoarthritis of both hips This is a pleasant 80 year old female with bilateral severe hip osteoarthritis last injected the right hip back in January of this year, she has done well with some medications, hip conditioning exercises, now having recurrence of pain in the right, and desires repeat injection, we will do repeat injection today, as long as she is active, mobile and getting almost a year of relief from hip joint injections we do not need to refer her for total hip replacement. Of note she was having pain going all the way down to her foot, I did advise that this is unlikely coming from the hip and if the pain to the left foot was persistent after her hip joint injection we would need to evaluate her lumbar spine.  Chronic process with exacerbation and pharmacologic intervention.  ___________________________________________ Gwen Her. Dianah Field, M.D., ABFM., CAQSM. Primary Care and [place] Instructor of [place] [facility]

## 2021-09-22 NOTE — Assessment & Plan Note (Addendum)
This is a pleasant 80 year old female with bilateral severe hip osteoarthritis last injected the right hip back in January of this year, she has done well with some medications, hip conditioning exercises, now having recurrence of pain in the right, and desires repeat injection, we will do repeat injection today, as long as she is active, mobile and getting almost a year of relief from hip joint injections we do not need to refer her for total hip replacement. Of note she was having pain going all the way down to her foot, I did advise that this is unlikely coming from the hip and if the pain to the left foot was persistent after her hip joint injection we would need to evaluate her lumbar spine.

## 2021-09-25 DIAGNOSIS — Z79811 Long term (current) use of aromatase inhibitors: Secondary | ICD-10-CM | POA: Diagnosis not present

## 2021-09-25 DIAGNOSIS — G893 Neoplasm related pain (acute) (chronic): Secondary | ICD-10-CM | POA: Diagnosis not present

## 2021-09-25 DIAGNOSIS — D7389 Other diseases of spleen: Secondary | ICD-10-CM | POA: Diagnosis not present

## 2021-09-25 DIAGNOSIS — Z9189 Other specified personal risk factors, not elsewhere classified: Secondary | ICD-10-CM | POA: Diagnosis not present

## 2021-09-25 DIAGNOSIS — C50911 Malignant neoplasm of unspecified site of right female breast: Secondary | ICD-10-CM | POA: Diagnosis not present

## 2021-09-25 DIAGNOSIS — R42 Dizziness and giddiness: Secondary | ICD-10-CM | POA: Diagnosis not present

## 2021-10-02 ENCOUNTER — Other Ambulatory Visit (HOSPITAL_BASED_OUTPATIENT_CLINIC_OR_DEPARTMENT_OTHER): Payer: Self-pay

## 2021-10-02 ENCOUNTER — Telehealth: Payer: Self-pay | Admitting: Family Medicine

## 2021-10-02 MED ORDER — INFLUENZA VAC A&B SA ADJ QUAD 0.5 ML IM PRSY
PREFILLED_SYRINGE | INTRAMUSCULAR | 0 refills | Status: DC
  Filled 2021-10-02: qty 0.5, 1d supply, fill #0

## 2021-10-02 NOTE — Telephone Encounter (Signed)
I have LM asking pt to call back to schedule a f/up appt last appt was in March

## 2021-10-10 ENCOUNTER — Other Ambulatory Visit: Payer: Self-pay | Admitting: Adult Health

## 2021-10-18 ENCOUNTER — Encounter: Payer: Self-pay | Admitting: Internal Medicine

## 2021-10-22 ENCOUNTER — Encounter (HOSPITAL_BASED_OUTPATIENT_CLINIC_OR_DEPARTMENT_OTHER): Payer: Self-pay | Admitting: Emergency Medicine

## 2021-10-22 ENCOUNTER — Emergency Department (HOSPITAL_BASED_OUTPATIENT_CLINIC_OR_DEPARTMENT_OTHER)
Admission: EM | Admit: 2021-10-22 | Discharge: 2021-10-23 | Disposition: A | Discharge status: Home / Self Care | Payer: PPO | Attending: Emergency Medicine | Admitting: Emergency Medicine

## 2021-10-22 ENCOUNTER — Other Ambulatory Visit: Payer: Self-pay

## 2021-10-22 DIAGNOSIS — J45909 Unspecified asthma, uncomplicated: Secondary | ICD-10-CM | POA: Insufficient documentation

## 2021-10-22 DIAGNOSIS — T424X1A Poisoning by benzodiazepines, accidental (unintentional), initial encounter: Secondary | ICD-10-CM | POA: Insufficient documentation

## 2021-10-22 DIAGNOSIS — T50901A Poisoning by unspecified drugs, medicaments and biological substances, accidental (unintentional), initial encounter: Secondary | ICD-10-CM | POA: Diagnosis not present

## 2021-10-22 DIAGNOSIS — Z853 Personal history of malignant neoplasm of breast: Secondary | ICD-10-CM | POA: Insufficient documentation

## 2021-10-22 DIAGNOSIS — Z7982 Long term (current) use of aspirin: Secondary | ICD-10-CM | POA: Insufficient documentation

## 2021-10-22 DIAGNOSIS — R000 Tachycardia, unspecified: Secondary | ICD-10-CM | POA: Diagnosis not present

## 2021-10-22 DIAGNOSIS — F0390 Unspecified dementia without behavioral disturbance: Secondary | ICD-10-CM | POA: Diagnosis not present

## 2021-10-22 DIAGNOSIS — J449 Chronic obstructive pulmonary disease, unspecified: Secondary | ICD-10-CM | POA: Diagnosis not present

## 2021-10-22 LAB — CBC WITH DIFFERENTIAL/PLATELET
Abs Immature Granulocytes: 0.02 10*3/uL (ref 0.00–0.07)
Basophils Absolute: 0 10*3/uL (ref 0.0–0.1)
Basophils Relative: 1 %
Eosinophils Absolute: 0.1 10*3/uL (ref 0.0–0.5)
Eosinophils Relative: 2 %
HCT: 36.4 % (ref 36.0–46.0)
Hemoglobin: 12.3 g/dL (ref 12.0–15.0)
Immature Granulocytes: 0 %
Lymphocytes Relative: 32 %
Lymphs Abs: 2.2 10*3/uL (ref 0.7–4.0)
MCH: 31.8 pg (ref 26.0–34.0)
MCHC: 33.8 g/dL (ref 30.0–36.0)
MCV: 94.1 fL (ref 80.0–100.0)
Monocytes Absolute: 0.6 10*3/uL (ref 0.1–1.0)
Monocytes Relative: 8 %
Neutro Abs: 3.9 10*3/uL (ref 1.7–7.7)
Neutrophils Relative %: 57 %
Platelets: 258 10*3/uL (ref 150–400)
RBC: 3.87 MIL/uL (ref 3.87–5.11)
RDW: 14.1 % (ref 11.5–15.5)
WBC: 6.9 10*3/uL (ref 4.0–10.5)
nRBC: 0 % (ref 0.0–0.2)

## 2021-10-22 LAB — COMPREHENSIVE METABOLIC PANEL
ALT: 20 U/L (ref 0–44)
AST: 26 U/L (ref 15–41)
Albumin: 4 g/dL (ref 3.5–5.0)
Alkaline Phosphatase: 71 U/L (ref 38–126)
Anion gap: 9 (ref 5–15)
BUN: 15 mg/dL (ref 8–23)
CO2: 28 mmol/L (ref 22–32)
Calcium: 9.1 mg/dL (ref 8.9–10.3)
Chloride: 101 mmol/L (ref 98–111)
Creatinine, Ser: 0.61 mg/dL (ref 0.44–1.00)
GFR, Estimated: >60 mL/min (ref >60)
Glucose, Bld: 108 mg/dL — ABNORMAL HIGH (ref 70–99)
Potassium: 3.7 mmol/L (ref 3.5–5.1)
Sodium: 138 mmol/L (ref 135–145)
Total Bilirubin: 0.3 mg/dL (ref 0.3–1.2)
Total Protein: 7.2 g/dL (ref 6.5–8.1)

## 2021-10-22 LAB — MAGNESIUM: Magnesium: 1.8 mg/dL (ref 1.7–2.4)

## 2021-10-22 MED ORDER — SODIUM CHLORIDE 0.9 % IV BOLUS
1000.0000 mL | Freq: Once | INTRAVENOUS | Status: AC
  Administered 2021-10-22: 1000 mL via INTRAVENOUS

## 2021-10-22 NOTE — ED Provider Notes (Signed)
[name] EMERGENCY DEPARTMENT Provider Note  CSN: [id number] Arrival date & time: 10/22/21 2219  Chief Complaint(s) Drug Overdose  HPI Anne [name] is a 80 y.o. female here for accidental ativan overdose.  Patient took five 1 mg tablets of Ativan thinking it was her amitriptyline prescription.  She normally takes five 25 mg tablets of amitriptyline at night.  Patient took the medication around 10 PM.  When she realized what she had done she presented for evaluation.  Only complains of mild sleepiness.  No other physical complaints.  She did not take her amitriptyline dose.   Drug Overdose   Past Medical History Past Medical History:  Diagnosis Date   ALKALINE PHOSPHATASE, ELEVATED 03/15/2009   Allergic state 06/10/2012   Allergy    Anemia    Anxiety and depression 04/28/2011   Arthritis    Asthma    Atypical chest pain 11/30/2011   AVM (arteriovenous malformation) of colon 2011   cecum   Baker's cyst of knee 05/22/2011   Cancer ([place]) 11/2006   XRT/chemo 01-02/ lobular invasive ca   Carotid artery disease ([place])    a. Carotid duplex 03/2014: stable 1-39% BICA, f/u due 03/2016.   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     Clotting disorder ([place])    COPD (chronic obstructive pulmonary disease) (HCC)    Dementia (HCC)    Dermatitis 35/57/3220   EE (eosinophilic esophagitis)    Emphysema of lung ([place])    Esophageal ring    ESOPHAGEAL STRICTURE 03/29/2009   Fall 11/23/2012   Family history of breast cancer    Family history of colon cancer    Family history of ovarian cancer    Family history of pancreatic cancer    Folliculitis of nose 25/42/7062   GERD (gastroesophageal reflux disease) 09/29/2009   improved s/p cholecystectomy and esophagus dilatation   History of chicken pox    History of measles    History of shingles    2 episodes   Hx of echocardiogram    a. Echo 01/2013:  mild LVH, EF 55-60%, normal wall motion, Gr 1 diast dysfn   Hyperlipidemia    Knee pain, bilateral 07/23/2011   Medicare annual wellness visit, subsequent 06/19/2015   Mixed hyperlipidemia 10/17/2010   Qualifier: Diagnosis of  By: Mack Guise     Orthostasis    Osteopenia 03/14/2011   Osteoporosis    Personal history of chemotherapy 2001   Personal history of radiation therapy 2001   rt breast   PERSONAL HX BREAST CANCER 09/29/2009   PVC's (premature ventricular contractions)    a. Event monitor 01/2013: NSR, extensive PVCs.   Radial neck fracture 10/2011   minimally displaced   Substance abuse ([place])    In remission 3 years.    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   Palpitations 04/30/2018   Skin lesion of face 02/07/2018   Acute right-sided low back pain 09/23/2017   Hypokalemia 08/11/2017   Abdominal pain 08/11/2017  SBO (small bowel obstruction) ([place]) 08/10/2017   History of right breast cancer 03/21/2017   Genetic testing 09/13/2016   Family history of breast cancer    Family history of pancreatic cancer    Family history of colon cancer    Pain in the chest 05/16/2016   Status post right breast reconstruction 05/11/2016   Breast cancer 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 ([place]) 05/11/2016   History of breast cancer in female 05/11/2016   Osteopenia determined by x-ray 05/11/2016   IBS (irritable bowel syndrome) 03/09/2016   Dyspnea 06/19/2015   Medicare annual wellness visit, subsequent 06/19/2015   Urinary  frequency 12/12/2014   Breast cancer, right breast ([place]) 10/18/2014   Superficial thrombophlebitis 03/16/2014   Plant dermatitis 03/16/2014   Chest wall pain 02/07/2014   Primary osteoarthritis of both hips 01/21/2014   Elevated sed rate 08/02/2013   Dermatitis 11/23/2012   Falls 11/23/2012   Preventative health care 10/21/2012   Allergy 06/10/2012   Urinary incontinence 03/19/2012   Anemia 12/21/2011   Knee pain, bilateral 07/23/2011   Baker's cyst of knee 09/60/4540   Eosinophilic esophagitis 98/09/9146   Anxiety and depression 04/28/2011   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 Authorizing Provider  acetaminophen (TYLENOL) 500 MG tablet Take 500-1,000 mg by mouth every 6 (six) hours as needed (for pain.).    [provider]  amitriptyline (ELAVIL) 25 MG tablet TAKE 6 TABLETS(150 MG) BY MOUTH AT BEDTIME 09/05/21   [name], MD  aspirin 81 MG EC tablet Take 81 mg by mouth daily.    [provider]  atorvastatin (LIPITOR) 20 MG tablet TAKE 1 TABLET BY MOUTH IN THE MORNING AND AT BEDTIME 10/11/21   Lendon Colonel, NP  Biotin 5 MG CAPS Take by mouth.    [provider]  Cholecalciferol (VITAMIN D3) 1000 UNITS CAPS Take 2,000 Units by mouth daily.    [provider]  Cyanocobalamin (VITAMIN B 12 PO) Take 1 tablet by mouth daily.    [provider]  diltiazem (CARDIZEM) 30 MG tablet TAKE 1 TABLET BY MOUTH DAILY FOR PALPITATIONS 01/27/21   Lendon Colonel, NP  Docusate Sodium (DSS) 100 MG CAPS Take by mouth.    [provider]  Ferrous Fumarate-Folic Acid 829-5 MG TABS Take 1 tablet by mouth daily. 09/03/18   [name], MD  influenza vaccine  adjuvanted (FLUAD) 0.5 ML injection TO BE ADMINISTERED AS DIRECTED Patient not taking: Reported on 08/29/2021 10/27/20 10/27/21  Carlyle Basques, MD  influenza vaccine adjuvanted (FLUAD) 0.5 ML injection Inject into the muscle. 10/02/21   Carlyle Basques, MD  letrozole [facility]) 2.5 MG tablet Take 1 tablet (2.5 mg total) by mouth daily. 06/16/20   Alla Feeling, NP  loratadine (CLARITIN) 10 MG tablet Take 1 tablet (10 mg total) by mouth daily. 04/29/18   [name], MD  LORazepam (ATIVAN) 1 MG tablet TAKE 1 TABLET(1 MG) BY MOUTH EVERY 8 HOURS AS NEEDED FOR ANXIETY 07/21/21   [name], MD  meclizine (ANTIVERT) 12.5 MG tablet Take 1 tablet by mouth as needed. 11/23/20   [provider]  meloxicam (MOBIC) 15 MG tablet One tab PO qAM with a meal for 2 weeks, then daily prn pain.  11/21/20   Silverio Decamp, MD  mupirocin cream (BACTROBAN) 2 % Apply 1 application topically 2 (two) times daily. 03/14/21   [name], MD  nystatin cream (MYCOSTATIN) Apply 1 application topically 2 (two) times daily. 03/03/21   Billie Ruddy, MD  pantoprazole (PROTONIX) 40 MG tablet Take 1 tablet (40 mg total) by mouth 2 (two) times daily before a meal. 08/29/21   Gatha Mayer, MD  permethrin (ELIMITE) 5 % cream Apply to affected area once, leave on for 8 to 14 hours then rinse completely, may repeat in 7 to 10 days if live mites are still present. 06/24/21   Eliezer Lofts, FNP  rOPINIRole (REQUIP) 0.25 MG tablet Take 1 tablet (0.25 mg total) by mouth at bedtime. 03/06/20   [name], MD  traMADol (ULTRAM) 50 MG tablet Take 0.5-1 tablets (25-50 mg total) by mouth every 8 (eight) hours as needed. 08/01/20   Truitt Merle, MD  triamcinolone cream (KENALOG) 0.1 % Apply 1 application topically 2 (two) times daily. 02/25/21   Faustino Congress, NP                                                                                                                                    Past Surgical  History Past Surgical History:  Procedure Laterality Date   APPENDECTOMY     AUGMENTATION MAMMAPLASTY Right 05/27/2007   BREAST BIOPSY Right 01/02/2007   wire loc   BREAST BIOPSY  12/26/2006   BREAST LUMPECTOMY Right 2001   BREAST RECONSTRUCTION  2008, 2009, 2010   BREAST REDUCTION WITH MASTOPEXY Left 05/30/2017   Procedure: LEFT BREAST REDUCTION FOR SYMTERY WITH MASTOPEXY;  Surgeon: Wallace Going, DO;  Location: [facility];  Service: Plastics;  Laterality: Left;   CHOLECYSTECTOMY  2010   COLONOSCOPY  09/05/10   [facility]   DENTAL SURGERY  05/2016   4 dental implants by Dr. Loyal Gambler.   ERCP  2010    CBD stone extraction    ESOPHAGOGASTRODUODENOSCOPY  01/08/2012   Procedure: ESOPHAGOGASTRODUODENOSCOPY (EGD);  Surgeon: Gatha Mayer, MD;  Location: [facility];  Service: Endoscopy;  Laterality: N/A;   ESOPHAGOGASTRODUODENOSCOPY (EGD) WITH ESOPHAGEAL DILATION  2010, 2012   LAPAROSCOPIC APPENDECTOMY N/A 08/04/2016   Procedure: APPENDECTOMY LAPAROSCOPIC;  Surgeon: Michael Boston, MD;  Location: [facility];  Service: General;  Laterality: N/A;   LIPOSUCTION WITH LIPOFILLING Left 11/21/2017   Procedure: LIPOSUCTION FROM ABDOMEN WITH LIPOFILLING TO LEFT BREAST;  Surgeon: Wallace Going, DO;  Location: [facility];  Service: Plastics;  Laterality: Left;   MASTECTOMY MODIFIED RADICAL Right 05/27/2007   , Mastectomy modified radical (08), breast reconstruction, CA lesions excised lateral abd wall 2010   MASTOPEXY Left 11/21/2017   Procedure: LEFT BREAST REVISION MASTOPEXY FOR SYMMETRY;  Surgeon: Wallace Going, DO;  Location: [facility];  Service: Plastics;  Laterality: Left;   REDUCTION MAMMAPLASTY  Left    SAVORY DILATION  01/08/2012   Procedure: SAVORY DILATION;  Surgeon: Gatha Mayer, MD;  Location: [facility];  Service: Endoscopy;  Laterality: N/A;  need xray   Family History Family History  Problem Relation Age of Onset   Heart  disease Father    Lung cancer Father        smoker   Cirrhosis Sister        Primary Biliary   Stroke Maternal Grandmother    Alcohol abuse Maternal Grandfather    Heart disease Paternal Grandfather    Esophageal cancer Paternal Grandfather    Rectal cancer Paternal Grandfather    Anxiety disorder Sister    Osteoporosis Sister    Arthritis Sister        Rheumatoid   Osteoporosis Sister    Skin cancer Sister        multiple skin cancers, over 77 excisions.   Other Mother        tic [place]   Breast cancer Maternal Aunt        dx in her 15s   Breast cancer Paternal Aunt        dx in her 53s   Pancreatic cancer Maternal Uncle        dx in his 67s; smoker   Breast cancer Maternal Aunt        dx in her 2s   Breast cancer Maternal Aunt        possible breast cancer dx and died in her 73s   Ovarian cancer Maternal Aunt 29   Pancreatic cancer Maternal Aunt        dx in her 48s   Stomach cancer Maternal Uncle    Colon cancer Maternal Uncle    Lung cancer Paternal Aunt    Breast cancer Cousin        paternal first cousin   Breast cancer Cousin        maternal first cousin   Anesthesia problems Neg Hx    Hypotension Neg Hx    Malignant hyperthermia Neg Hx    Pseudochol deficiency Neg Hx     Social History Social History   Tobacco Use   Smoking status: Former    Packs/day: 1.00    Years: 50.00    Pack years: 50.00    Types: Cigarettes    Quit date: 05/22/2007    Years since quitting: 14.4   Smokeless tobacco: Never  Vaping Use   Vaping Use: Never used  Substance Use Topics   Alcohol use: Not Currently   Drug use: No   Allergies Codeine; Morphine; Peanut-containing drug products; Sorbitol; Tomato; Zofran; Advil [ibuprofen]; Cephalexin; Diphenhydramine hcl; Morphine and related; Antihistamines, chlorpheniramine-type; and Oxycodone  Review of Systems Review of Systems All other systems are reviewed and are negative for acute change except as noted in the  HPI  Physical Exam Vital Signs  I have reviewed the triage vital signs BP 135/76 (BP Location: Left Arm)   Pulse 100   Temp 98.2 F (36.8 C) (Oral)   Resp 18   Ht 5' 5.5" (1.664 m)   Wt 57.6 kg   SpO2 99%   BMI 20.81 kg/m   Physical Exam Vitals reviewed.  Constitutional:      General: She is not in acute distress.    Appearance: She is well-developed. She is not diaphoretic.  HENT:     Head: Normocephalic and atraumatic.     Nose: Nose normal.  Eyes:  General: No scleral icterus.       Right eye: No discharge.        Left eye: No discharge.     Conjunctiva/sclera: Conjunctivae normal.     Pupils: Pupils are equal, round, and reactive to light.  Cardiovascular:     Rate and Rhythm: Normal rate and regular rhythm.     Heart sounds: No murmur heard.   No friction rub. No gallop.  Pulmonary:     Effort: Pulmonary effort is normal. No respiratory distress.     Breath sounds: Normal breath sounds. No stridor. No rales.  Abdominal:     General: There is no distension.     Palpations: Abdomen is soft.     Tenderness: There is no abdominal tenderness.  Musculoskeletal:        General: No tenderness.     Cervical back: Normal range of motion and neck supple.  Skin:    General: Skin is warm and dry.     Findings: No erythema or rash.  Neurological:     Mental Status: She is alert and oriented to person, place, and time.    ED Results and Treatments Labs (all labs ordered are listed, but only abnormal results are displayed) Labs Reviewed  COMPREHENSIVE METABOLIC PANEL - Abnormal; Notable for the following components:      Result Value   Glucose, Bld 108 (*)    All other components within normal limits  CBC WITH DIFFERENTIAL/PLATELET  MAGNESIUM                                                                                                                         EKG  EKG Interpretation  Date/Time:  Sunday October 22 2021 22:40:00 EST Ventricular Rate:  98 PR  Interval:  154 QRS Duration: 138 QT Interval:  394 QTC Calculation: 504 R Axis:   59 Text Interpretation: Sinus rhythm Right bundle branch block Otherwise no significant change Confirmed by Addison Lank [id number]) on 10/22/2021 11:34:43 PM       Radiology No results found.  Pertinent labs & imaging results that were available during my care of the patient were reviewed by me and considered in my medical decision making (see MDM for details).  Medications Ordered in ED Medications  sodium chloride 0.9 % bolus 1,000 mL (0 mLs Intravenous Stopped 10/23/21 0352)  Procedures Procedures  (including critical care time)  Medical Decision Making / ED Course I have reviewed the nursing notes for this encounter and the patient's prior records (if available in EHR or on provided paperwork).  CELE MOTE was evaluated in Emergency Department on 10/23/2021 for the symptoms described in the history of present illness. She was evaluated in the context of the global COVID-19 pandemic, which necessitated consideration that the patient might be at risk for infection with the SARS-CoV-2 virus that causes COVID-19. Institutional protocols and algorithms that pertain to the evaluation of patients at risk for COVID-19 are in a state of rapid change based on information released by regulatory bodies including the CDC and federal and state organizations. These policies and algorithms were followed during the patient's care in the ED.     Accidental OD on 5 mg of Ativan. HDS. Will get basic labs and monitor.  Pertinent labs & imaging results that were available during my care of the patient were reviewed by me and considered in my medical decision making:  Labs reassuring. Monitored for 8 hours. Needed to be placed on 1LNC for slightly low sats. At 8 hours, was able to  ambulate without complication and maintain her sats at 100% on room air.  Final Clinical Impression(s) / ED Diagnoses Final diagnoses:  Accidental drug overdose, initial encounter    The patient appears reasonably screened and/or stabilized for discharge and I doubt any other medical condition or other [facility] requiring further screening, evaluation, or treatment in the ED at this time prior to discharge. Safe for discharge with strict return precautions.  Disposition: Discharge  Condition: Good  I have discussed the results, Dx and Tx plan with the patient/family who expressed understanding and agree(s) with the plan. Discharge instructions discussed at length. The patient/family was given strict return precautions who verbalized understanding of the instructions. No further questions at time of discharge.    ED Discharge Orders     None         Follow Up: [name], [address] [place] [id number] [phone]  Call  as needed   This chart was dictated using voice recognition software.  Despite best efforts to proofread,  errors can occur which can change the documentation meaning.    Fatima Blank, MD 10/23/21 [phone]

## 2021-10-22 NOTE — ED Triage Notes (Signed)
Reports she accidentally took lorazepam 1mg  X 5 tablets thinking it was her amytriptyline.  Denies any symptoms.  Taken around 10pm.

## 2021-10-23 NOTE — ED Notes (Signed)
Placed patient on 2 ltrs of oxygen per md. O2 sats dropping to 91

## 2021-10-23 NOTE — ED Notes (Signed)
Pt able to ambulate around the ed with no trouble. Vs wnl

## 2021-10-23 NOTE — ED Notes (Signed)
Called patients husband and he said he will be here in 20 min

## 2021-10-25 ENCOUNTER — Other Ambulatory Visit: Payer: Self-pay

## 2021-10-25 ENCOUNTER — Other Ambulatory Visit (HOSPITAL_BASED_OUTPATIENT_CLINIC_OR_DEPARTMENT_OTHER): Payer: Self-pay

## 2021-10-25 ENCOUNTER — Ambulatory Visit: Payer: PPO | Attending: Internal Medicine

## 2021-10-25 DIAGNOSIS — Z23 Encounter for immunization: Secondary | ICD-10-CM

## 2021-10-25 MED ORDER — PFIZER COVID-19 VAC BIVALENT 30 MCG/0.3ML IM SUSP
INTRAMUSCULAR | 0 refills | Status: DC
  Filled 2021-10-25: qty 0.3, 1d supply, fill #0

## 2021-10-25 MED ORDER — PANTOPRAZOLE SODIUM 40 MG PO TBEC: 40.0000 mg | DELAYED_RELEASE_TABLET | Freq: Two times a day (BID) | ORAL | 3 refills | Status: DC

## 2021-10-25 NOTE — Telephone Encounter (Signed)
Pantoprazole 40mg  tablet's refilled for 90 day supply as requested thru [facility].

## 2021-10-25 NOTE — Progress Notes (Signed)
° °  Covid-19 Vaccination Clinic  Name:  Anne [name]    MRN: [id number] DOB: [birth year]  10/25/2021  [name] was observed post Covid-19 immunization for 15 minutes without incident. She was provided with Vaccine Information Sheet and instruction to access the V-Safe system.   [name] was instructed to call 911 with any severe reactions post vaccine: Difficulty breathing  Swelling of face and throat  A fast heartbeat  A bad rash all over body  Dizziness and weakness   Immunizations Administered     Name Date Dose VIS Date Route   Pfizer Covid-19 Vaccine Bivalent Booster 10/25/2021  2:53 PM 0.3 mL 07/12/2021 Intramuscular   Manufacturer: [employer]   Lot: [id number]   [place]: [phone]

## 2021-10-27 ENCOUNTER — Telehealth: Payer: Self-pay | Admitting: Family Medicine

## 2021-10-27 NOTE — Telephone Encounter (Signed)
Left message for patient to call back and schedule Medicare Annual Wellness Visit (AWV) in office.   If not able to come in office, please offer to do virtually or by telephone.  Left office number and my jabber [phone].  Last AWV:03/17/2018  Please schedule at anytime with Nurse Health Advisor.

## 2021-10-30 DIAGNOSIS — C50911 Malignant neoplasm of unspecified site of right female breast: Secondary | ICD-10-CM | POA: Diagnosis not present

## 2021-11-03 ENCOUNTER — Other Ambulatory Visit: Payer: Self-pay | Admitting: Family Medicine

## 2021-11-03 DIAGNOSIS — S299XXA Unspecified injury of thorax, initial encounter: Secondary | ICD-10-CM

## 2021-11-07 NOTE — Telephone Encounter (Signed)
Requesting: lorazepam 1mg   Contract:  02/07/2021 UDS: 02/07/2021 Last Visit: 09/22/2021 Next Visit: 02/01/2021 Last Refill: 07/21/2021 #70 and 1RF  Please Advise

## 2021-12-14 DIAGNOSIS — H43811 Vitreous degeneration, right eye: Secondary | ICD-10-CM | POA: Diagnosis not present

## 2021-12-14 DIAGNOSIS — H31091 Other chorioretinal scars, right eye: Secondary | ICD-10-CM | POA: Diagnosis not present

## 2021-12-14 DIAGNOSIS — H353132 Nonexudative age-related macular degeneration, bilateral, intermediate dry stage: Secondary | ICD-10-CM | POA: Diagnosis not present

## 2021-12-14 DIAGNOSIS — H5319 Other subjective visual disturbances: Secondary | ICD-10-CM | POA: Diagnosis not present

## 2021-12-14 DIAGNOSIS — H26491 Other secondary cataract, right eye: Secondary | ICD-10-CM | POA: Diagnosis not present

## 2021-12-14 DIAGNOSIS — H04123 Dry eye syndrome of bilateral lacrimal glands: Secondary | ICD-10-CM | POA: Diagnosis not present

## 2021-12-14 DIAGNOSIS — Z961 Presence of intraocular lens: Secondary | ICD-10-CM | POA: Diagnosis not present

## 2021-12-15 ENCOUNTER — Encounter: Payer: Self-pay | Admitting: Family Medicine

## 2021-12-15 ENCOUNTER — Other Ambulatory Visit: Payer: Self-pay | Admitting: Family Medicine

## 2021-12-17 ENCOUNTER — Other Ambulatory Visit: Payer: Self-pay | Admitting: Family Medicine

## 2021-12-17 DIAGNOSIS — S299XXA Unspecified injury of thorax, initial encounter: Secondary | ICD-10-CM

## 2021-12-17 MED ORDER — LORAZEPAM 1 MG PO TABS: 1.0000 mg | ORAL_TABLET | Freq: Three times a day (TID) | ORAL | 1 refills | Status: DC | PRN

## 2021-12-20 DIAGNOSIS — G893 Neoplasm related pain (acute) (chronic): Secondary | ICD-10-CM | POA: Diagnosis not present

## 2021-12-20 DIAGNOSIS — M542 Cervicalgia: Secondary | ICD-10-CM | POA: Diagnosis not present

## 2021-12-20 DIAGNOSIS — C50911 Malignant neoplasm of unspecified site of right female breast: Secondary | ICD-10-CM | POA: Diagnosis not present

## 2021-12-26 ENCOUNTER — Encounter: Payer: Self-pay | Admitting: Family Medicine

## 2021-12-28 ENCOUNTER — Encounter: Payer: Self-pay | Admitting: Family Medicine

## 2021-12-29 ENCOUNTER — Other Ambulatory Visit: Payer: Self-pay | Admitting: Family Medicine

## 2021-12-29 MED ORDER — FLUOXETINE HCL 10 MG PO CAPS: 10.0000 mg | ORAL_CAPSULE | Freq: Every day | ORAL | 2 refills | Status: DC

## 2021-12-29 NOTE — Telephone Encounter (Signed)
Lvm with details and she already had an appointment

## 2022-01-06 ENCOUNTER — Encounter: Payer: Self-pay | Admitting: Family Medicine

## 2022-01-11 ENCOUNTER — Telehealth: Payer: Self-pay | Admitting: Family Medicine

## 2022-01-11 NOTE — Telephone Encounter (Signed)
Left message for patient to call back and schedule Medicare Annual Wellness Visit (AWV) in office.  ? ?If not able to come in office, please offer to do virtually or by telephone.  Left office number and my jabber [phone]. ? ?Last AWV:03/17/2018 ? ?Please schedule at anytime with Nurse Health Advisor. ?  ?

## 2022-01-12 ENCOUNTER — Encounter: Payer: Self-pay | Admitting: Internal Medicine

## 2022-01-20 ENCOUNTER — Other Ambulatory Visit: Payer: Self-pay | Admitting: Adult Health

## 2022-01-31 NOTE — Progress Notes (Signed)
? ?Subjective:  ? ? Patient ID: Anne [name], female    DOB: 23-Jul-1941, 81 y.o.   MRN: [id number] ? ?Chief Complaint  ?Patient presents with  ? Follow-up  ? Depression  ?  Feels very sad in the mornings  ? ? ?HPI ?Patient is in today for a follow up on chronic conditions. She is struggling with anhedonia and anxiety. She acknowledges she has continues to struggle with anxiety and depression and she even questions if she has suffered from some hallucinations at times when she is not doing well. Seeing people that are not there at times. This has happened on and off for years and is not new with the Fluoxetine 10 mg, she has had no trouble with the medication, she is not currently drinking but still has these episodes. She is getting more forgetful. No recent febrile illness or hospitalizations. She is sad about the progression of her breast cancer lesions on her skin. Denies CP/palp/SOB/HA/congestion/fevers/GI or GU c/o. Taking meds as prescribed  ? ?Past Medical History:  ?Diagnosis Date  ? ALKALINE PHOSPHATASE, ELEVATED 03/15/2009  ? Allergic state 06/10/2012  ? Allergy   ? Anemia   ? Anxiety and depression 04/28/2011  ? Arthritis   ? Asthma   ? Atypical chest pain 11/30/2011  ? AVM (arteriovenous malformation) of colon 2011  ? cecum  ? Baker's cyst of knee 05/22/2011  ? Cancer ([place]) 11/2006  ? XRT/chemo 01-02/ lobular invasive ca  ? Carotid artery disease ([place])   ? a. Carotid duplex 03/2014: stable 1-39% BICA, f/u due 03/2016.  ? 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 Did not tolerate Klonopin, caused some confusion and bad dreams.    ? Clotting disorder ([place])   ? COPD (chronic obstructive pulmonary disease) ([place])   ? Dementia ([place])   ? Dermatitis 11/23/2012  ? EE (eosinophilic esophagitis)   ? Emphysema of lung ([place])   ? Esophageal ring   ? ESOPHAGEAL STRICTURE 03/29/2009  ? Fall 11/23/2012  ? Family history of breast cancer   ? Family history of colon cancer   ? Family history of ovarian cancer    ? Family history of pancreatic cancer   ? Folliculitis of nose 46/80/3212  ? GERD (gastroesophageal reflux disease) 09/29/2009  ? improved s/p cholecystectomy and esophagus dilatation  ? History of chicken pox   ? History of measles   ? History of shingles   ? 2 episodes  ? Hx of echocardiogram   ? a. Echo 01/2013: mild LVH, EF 55-60%, normal wall motion, Gr 1 diast dysfn  ? Hyperlipidemia   ? Knee pain, bilateral 07/23/2011  ? Medicare annual wellness visit, subsequent 06/19/2015  ? Mixed hyperlipidemia 10/17/2010  ? Qualifier: Diagnosis of  By: Mack Guise    ? Orthostasis   ? Osteopenia 03/14/2011  ? Osteoporosis   ? Personal history of chemotherapy 2001  ? Personal history of radiation therapy 2001  ? rt breast  ? PERSONAL HX BREAST CANCER 09/29/2009  ? PVC's (premature ventricular contractions)   ? a. Event monitor 01/2013: NSR, extensive PVCs.  ? Radial neck fracture 10/2011  ? minimally displaced  ? Substance abuse ([place])   ? In remission 3 years.   ? Urinary incontinence 03/19/2012  ? Vaginitis 05/22/2011  ? ? ?Past Surgical History:  ?Procedure Laterality Date  ? APPENDECTOMY    ? AUGMENTATION MAMMAPLASTY Right 05/27/2007  ? BREAST BIOPSY Right 01/02/2007  ? wire loc  ? BREAST BIOPSY  12/26/2006  ?  BREAST LUMPECTOMY Right 2001  ? BREAST RECONSTRUCTION  2008, 2009, 2010  ? BREAST REDUCTION WITH MASTOPEXY Left 05/30/2017  ? Procedure: LEFT BREAST REDUCTION FOR SYMTERY WITH MASTOPEXY;  Surgeon: Wallace Going, DO;  Location: [facility];  Service: Plastics;  Laterality: Left;  ? CHOLECYSTECTOMY  2010  ? COLONOSCOPY  09/05/10  ? cecal avm's  ? DENTAL SURGERY  05/2016  ? 4 dental implants by Dr. Loyal Gambler.  ? ERCP  2010  ?  CBD stone extraction   ? ESOPHAGOGASTRODUODENOSCOPY  01/08/2012  ? Procedure: ESOPHAGOGASTRODUODENOSCOPY (EGD);  Surgeon: Gatha Mayer, MD;  Location: [facility];  Service: Endoscopy;  Laterality: N/A;  ? ESOPHAGOGASTRODUODENOSCOPY (EGD) WITH ESOPHAGEAL DILATION  2010,  2012  ? LAPAROSCOPIC APPENDECTOMY N/A 08/04/2016  ? Procedure: APPENDECTOMY LAPAROSCOPIC;  Surgeon: Michael Boston, MD;  Location: [facility];  Service: General;  Laterality: N/A;  ? LIPOSUCTION WITH LIPOFILLING Left 11/21/2017  ? Procedure: LIPOSUCTION FROM ABDOMEN WITH LIPOFILLING TO LEFT BREAST;  Surgeon: Wallace Going, DO;  Location: [facility];  Service: Plastics;  Laterality: Left;  ? MASTECTOMY MODIFIED RADICAL Right 05/27/2007  ? , Mastectomy modified radical (08), breast reconstruction, CA lesions excised lateral abd wall 2010  ? MASTOPEXY Left 11/21/2017  ? Procedure: LEFT BREAST REVISION MASTOPEXY FOR SYMMETRY;  Surgeon: Wallace Going, DO;  Location: [facility];  Service: Plastics;  Laterality: Left;  ? REDUCTION MAMMAPLASTY Left   ? SAVORY DILATION  01/08/2012  ? Procedure: SAVORY DILATION;  Surgeon: Gatha Mayer, MD;  Location: [facility];  Service: Endoscopy;  Laterality: N/A;  need xray  ? ? ?Family History  ?Problem Relation Age of Onset  ? Heart disease Father   ? Lung cancer Father   ?     smoker  ? Cirrhosis Sister   ?     Primary Biliary  ? Stroke Maternal Grandmother   ? Alcohol abuse Maternal Grandfather   ? Heart disease Paternal Grandfather   ? Esophageal cancer Paternal Grandfather   ? Rectal cancer Paternal Grandfather   ? Anxiety disorder Sister   ? Osteoporosis Sister   ? Arthritis Sister   ?     Rheumatoid  ? Osteoporosis Sister   ? Skin cancer Sister   ?     multiple skin cancers, over 44 excisions.  ? Other Mother   ?     tic [place]  ? Breast cancer Maternal Aunt   ?     dx in her 78s  ? Breast cancer Paternal Aunt   ?     dx in her 59s  ? Pancreatic cancer Maternal Uncle   ?     dx in his 37s; smoker  ? Breast cancer Maternal Aunt   ?     dx in her 39s  ? Breast cancer Maternal Aunt   ?     possible breast cancer dx and died in her 40s  ? Ovarian cancer Maternal Aunt 29  ? Pancreatic cancer Maternal Aunt   ?     dx in her 16s  ? Stomach cancer  Maternal Uncle   ? Colon cancer Maternal Uncle   ? Lung cancer Paternal Aunt   ? Breast cancer Cousin   ?     paternal first cousin  ? Breast cancer Cousin   ?     maternal first cousin  ? Anesthesia problems Neg Hx   ? Hypotension Neg Hx   ? Malignant hyperthermia Neg Hx   ?  Pseudochol deficiency Neg Hx   ? ? ?Social History  ? ?Socioeconomic History  ? Marital status: Married  ?  Spouse name: Sam  ? Number of children: 3  ? Years of education: Not on file  ? Highest education level: Not on file  ?Occupational History  ? Occupation: Social worker  ?Tobacco Use  ? Smoking status: Former  ?  Packs/day: 1.00  ?  Years: 50.00  ?  Pack years: 50.00  ?  Types: Cigarettes  ?  Quit date: 05/22/2007  ?  Years since quitting: 14.7  ? Smokeless tobacco: Never  ?Vaping Use  ? Vaping Use: Never used  ?Substance and Sexual Activity  ? Alcohol use: Not Currently  ? Drug use: No  ? Sexual activity: Yes  ?  Partners: Male  ?  Comment: lives with husband,   ?Other Topics Concern  ? Not on file  ?Social History Narrative  ? Not on file  ? ?Social Determinants of Health  ? ?Financial Resource Strain: Not on file  ?Food Insecurity: Not on file  ?Transportation Needs: Not on file  ?Physical Activity: Not on file  ?Stress: Not on file  ?Social Connections: Not on file  ?Intimate Partner Violence: Not on file  ? ? ?Outpatient Medications Prior to Visit  ?Medication Sig Dispense Refill  ? acetaminophen (TYLENOL) 500 MG tablet Take 500-1,000 mg by mouth every 6 (six) hours as needed (for pain.).    ? amitriptyline (ELAVIL) 25 MG tablet TAKE 6 TABLETS(150 MG) BY MOUTH AT BEDTIME 180 tablet 5  ? aspirin 81 MG EC tablet Take 81 mg by mouth daily.    ? atorvastatin (LIPITOR) 20 MG tablet Take 1 tablet by mouth in the morning and at bedtime. Patient need a appointment for future refills. 180 tablet 0  ? Biotin 5 MG CAPS Take by mouth.    ? Cholecalciferol (VITAMIN D3) 1000 UNITS CAPS Take 2,000 Units by mouth daily.    ? COVID-19 mRNA bivalent  vaccine, Pfizer, (PFIZER COVID-19 VAC BIVALENT) injection Inject into the muscle. 0.3 mL 0  ? Cyanocobalamin (VITAMIN B 12 PO) Take 1 tablet by mouth daily.    ? diltiazem (CARDIZEM) 30 MG tablet TAKE 1 TABL

## 2022-02-01 ENCOUNTER — Ambulatory Visit (INDEPENDENT_AMBULATORY_CARE_PROVIDER_SITE_OTHER): Payer: Medicare HMO | Admitting: Family Medicine

## 2022-02-01 ENCOUNTER — Encounter: Payer: Self-pay | Admitting: Family Medicine

## 2022-02-01 ENCOUNTER — Other Ambulatory Visit: Payer: Medicare HMO

## 2022-02-01 VITALS — BP 116/80 | HR 84 | Resp 20 | Ht 65.5 in | Wt 133.2 lb

## 2022-02-01 DIAGNOSIS — C792 Secondary malignant neoplasm of skin: Secondary | ICD-10-CM

## 2022-02-01 DIAGNOSIS — J45909 Unspecified asthma, uncomplicated: Secondary | ICD-10-CM | POA: Diagnosis not present

## 2022-02-01 DIAGNOSIS — G4700 Insomnia, unspecified: Secondary | ICD-10-CM | POA: Diagnosis not present

## 2022-02-01 DIAGNOSIS — Z79899 Other long term (current) drug therapy: Secondary | ICD-10-CM | POA: Diagnosis not present

## 2022-02-01 DIAGNOSIS — C50911 Malignant neoplasm of unspecified site of right female breast: Secondary | ICD-10-CM | POA: Diagnosis not present

## 2022-02-01 DIAGNOSIS — R739 Hyperglycemia, unspecified: Secondary | ICD-10-CM | POA: Diagnosis not present

## 2022-02-01 DIAGNOSIS — R002 Palpitations: Secondary | ICD-10-CM | POA: Diagnosis not present

## 2022-02-01 DIAGNOSIS — R413 Other amnesia: Secondary | ICD-10-CM | POA: Diagnosis not present

## 2022-02-01 DIAGNOSIS — E782 Mixed hyperlipidemia: Secondary | ICD-10-CM

## 2022-02-01 DIAGNOSIS — F32A Depression, unspecified: Secondary | ICD-10-CM

## 2022-02-01 DIAGNOSIS — R399 Unspecified symptoms and signs involving the genitourinary system: Secondary | ICD-10-CM

## 2022-02-01 DIAGNOSIS — F419 Anxiety disorder, unspecified: Secondary | ICD-10-CM

## 2022-02-01 DIAGNOSIS — M545 Low back pain, unspecified: Secondary | ICD-10-CM

## 2022-02-01 DIAGNOSIS — R700 Elevated erythrocyte sedimentation rate: Secondary | ICD-10-CM

## 2022-02-01 LAB — COMPREHENSIVE METABOLIC PANEL
ALT: 18 U/L (ref 0–35)
AST: 19 U/L (ref 0–37)
Albumin: 4.5 g/dL (ref 3.5–5.2)
Alkaline Phosphatase: 62 U/L (ref 39–117)
BUN: 18 mg/dL (ref 6–23)
CO2: 32 mEq/L (ref 19–32)
Calcium: 9.2 mg/dL (ref 8.4–10.5)
Chloride: 101 mEq/L (ref 96–112)
Creatinine, Ser: 0.73 mg/dL (ref 0.40–1.20)
GFR: 78.23 mL/min (ref >60.00)
Glucose, Bld: 87 mg/dL (ref 70–99)
Potassium: 3.9 mEq/L (ref 3.5–5.1)
Sodium: 140 mEq/L (ref 135–145)
Total Bilirubin: 0.4 mg/dL (ref 0.2–1.2)
Total Protein: 7.1 g/dL (ref 6.0–8.3)

## 2022-02-01 LAB — LIPID PANEL
Cholesterol: 157 mg/dL (ref 0–200)
HDL: 74.3 mg/dL (ref >39.00)
LDL Cholesterol: 66 mg/dL (ref 0–99)
NonHDL: 82.84
Total CHOL/HDL Ratio: 2
Triglycerides: 86 mg/dL (ref 0.0–149.0)
VLDL: 17.2 mg/dL (ref 0.0–40.0)

## 2022-02-01 LAB — CBC
HCT: 37.7 % (ref 36.0–46.0)
Hemoglobin: 12.6 g/dL (ref 12.0–15.0)
MCHC: 33.4 g/dL (ref 30.0–36.0)
MCV: 93.6 fl (ref 78.0–100.0)
Platelets: 237 10*3/uL (ref 150.0–400.0)
RBC: 4.03 Mil/uL (ref 3.87–5.11)
RDW: 13.5 % (ref 11.5–15.5)
WBC: 5.9 10*3/uL (ref 4.0–10.5)

## 2022-02-01 LAB — HEMOGLOBIN A1C: Hgb A1c MFr Bld: 5.7 % (ref 4.6–6.5)

## 2022-02-01 LAB — TSH: TSH: 3.76 u[IU]/mL (ref 0.35–5.50)

## 2022-02-01 MED ORDER — FLUOXETINE HCL 20 MG PO CAPS: 20.0000 mg | ORAL_CAPSULE | Freq: Every day | ORAL | 3 refills | Status: DC

## 2022-02-01 NOTE — Assessment & Plan Note (Signed)
No recent exacerbation 

## 2022-02-01 NOTE — Assessment & Plan Note (Signed)
Encourage heart healthy diet such as MIND or DASH diet, increase exercise, avoid trans fats, simple carbohydrates and processed foods, consider a krill or fish or flaxseed oil cap daily. Tolerating Atorvastatin 

## 2022-02-01 NOTE — Assessment & Plan Note (Signed)
No c/o continue to monitor ?

## 2022-02-01 NOTE — Assessment & Plan Note (Signed)
hgba1c acceptable, minimize simple carbs. Increase exercise as tolerated.  

## 2022-02-01 NOTE — Patient Instructions (Addendum)
Drop Amitriptyline by one pill monthly over the next few months, drop to 5 tabs nightly this month and then 4 tabs nightly next month as able and as tolerated ? ? ?Insomnia ?Insomnia is a sleep disorder that makes it difficult to fall asleep or stay asleep. Insomnia can cause fatigue, low energy, difficulty concentrating, mood swings, and poor performance at work or school. ?There are three different ways to classify insomnia: ?Difficulty falling asleep. ?Difficulty staying asleep. ?Waking up too early in the morning. ?Any type of insomnia can be long-term (chronic) or short-term (acute). Both are common. Short-term insomnia usually lasts for three months or less. Chronic insomnia occurs at least three times a week for longer than three months. ?What are the causes? ?Insomnia may be caused by another condition, situation, or substance, such as: ?Anxiety. ?Certain medicines. ?Gastroesophageal reflux disease (GERD) or other gastrointestinal conditions. ?Asthma or other breathing conditions. ?Restless legs syndrome, sleep apnea, or other sleep disorders. ?Chronic pain. ?Menopause. ?Stroke. ?Abuse of alcohol, tobacco, or illegal drugs. ?Mental health conditions, such as depression. ?Caffeine. ?Neurological disorders, such as Alzheimer's disease. ?An overactive thyroid (hyperthyroidism). ?Sometimes, the cause of insomnia may not be known. ?What increases the risk? ?Risk factors for insomnia include: ?Gender. Women are affected more often than men. ?Age. Insomnia is more common as you get older. ?Stress. ?Lack of exercise. ?Irregular work schedule or working night shifts. ?Traveling between different time zones. ?Certain medical and mental health conditions. ?What are the signs or symptoms? ?If you have insomnia, the main symptom is having trouble falling asleep or having trouble staying asleep. This may lead to other symptoms, such as: ?Feeling fatigued or having low energy. ?Feeling nervous about going to sleep. ?Not  feeling rested in the morning. ?Having trouble concentrating. ?Feeling irritable, anxious, or depressed. ?How is this diagnosed? ?This condition may be diagnosed based on: ?Your symptoms and medical history. Your health care provider may ask about: ?Your sleep habits. ?Any medical conditions you have. ?Your mental health. ?A physical exam. ?How is this treated? ?Treatment for insomnia depends on the cause. Treatment may focus on treating an underlying condition that is causing insomnia. Treatment may also include: ?Medicines to help you sleep. ?Counseling or therapy. ?Lifestyle adjustments to help you sleep better. ?Follow these instructions at home: ?Eating and drinking ? ?Limit or avoid alcohol, caffeinated beverages, and cigarettes, especially close to bedtime. These can disrupt your sleep. ?Do not eat a large meal or eat spicy foods right before bedtime. This can lead to digestive discomfort that can make it hard for you to sleep. ?Sleep habits ? ?Keep a sleep diary to help you and your health care provider figure out what could be causing your insomnia. Write down: ?When you sleep. ?When you wake up during the night. ?How well you sleep. ?How rested you feel the next day. ?Any side effects of medicines you are taking. ?What you eat and drink. ?Make your bedroom a dark, comfortable place where it is easy to fall asleep. ?Put up shades or blackout curtains to block light from outside. ?Use a white noise machine to block noise. ?Keep the temperature cool. ?Limit screen use before bedtime. This includes: ?Watching TV. ?Using your smartphone, tablet, or computer. ?Stick to a routine that includes going to bed and waking up at the same times every day and night. This can help you fall asleep faster. Consider making a quiet activity, such as reading, part of your nighttime routine. ?Try to avoid taking naps during the day so  that you sleep better at night. ?Get out of bed if you are still awake after 15 minutes of  trying to sleep. Keep the lights down, but try reading or doing a quiet activity. When you feel sleepy, go back to bed. ?General instructions ?Take over-the-counter and prescription medicines only as told by your health care provider. ?Exercise regularly, as told by your health care provider. Avoid exercise starting several hours before bedtime. ?Use relaxation techniques to manage stress. Ask your health care provider to suggest some techniques that may work well for you. These may include: ?Breathing exercises. ?Routines to release muscle tension. ?Visualizing peaceful scenes. ?Make sure that you drive carefully. Avoid driving if you feel very sleepy. ?Keep all follow-up visits as told by your health care provider. This is important. ?Contact a health care provider if: ?You are tired throughout the day. ?You have trouble in your daily routine due to sleepiness. ?You continue to have sleep problems, or your sleep problems get worse. ?Get help right away if: ?You have serious thoughts about hurting yourself or someone else. ?If you ever feel like you may hurt yourself or others, or have thoughts about taking your own life, get help right away. You can go to your nearest emergency department or call: ?Your local emergency services (911 in the [place] ?A suicide crisis helpline, such as the [facility] at 251 118 8759 or 988 in the [place]. This is open 24 hours a day. ?Summary ?Insomnia is a sleep disorder that makes it difficult to fall asleep or stay asleep. ?Insomnia can be long-term (chronic) or short-term (acute). ?Treatment for insomnia depends on the cause. Treatment may focus on treating an underlying condition that is causing insomnia. ?Keep a sleep diary to help you and your health care provider figure out what could be causing your insomnia. ?This information is not intended to replace advice given to you by your health care provider. Make sure you discuss any questions you have with  your health care provider. ?Document Revised: 05/24/2021 Document Reviewed: 09/08/2020 ?Elsevier Patient Education ? [place]. ? ?

## 2022-02-01 NOTE — Assessment & Plan Note (Signed)
monitor

## 2022-02-03 ENCOUNTER — Encounter: Payer: Self-pay | Admitting: Family Medicine

## 2022-02-03 DIAGNOSIS — R413 Other amnesia: Secondary | ICD-10-CM | POA: Insufficient documentation

## 2022-02-03 NOTE — Assessment & Plan Note (Signed)
Continues to worsen and she is following closely with oncology ?

## 2022-02-03 NOTE — Assessment & Plan Note (Signed)
She agrees to referral to Neuropsychology for testing. She is not currently drinking but has in the past, she is worried about episodes of hallucinations that have occurred off and on over the years. Not necessarily when drinking.  ?

## 2022-02-03 NOTE — Assessment & Plan Note (Signed)
She acknowledges she has continues to struggle with anxiety and depression and she even questions if she has suffered from some hallucinations at times when she is not doing well. Seeing people that are not there at times. This has happened on and off for years and is not new with the Fluoxetine 10 mg, she has had no trouble with the medication so we will increase to 20 mg po daily and reevaluate.  ?

## 2022-02-04 LAB — DRUG MONITORING PANEL 376104, URINE
Alphahydroxyalprazolam: NEGATIVE ng/mL (ref <25)
Alphahydroxymidazolam: NEGATIVE ng/mL (ref <50)
Alphahydroxytriazolam: NEGATIVE ng/mL (ref <50)
Aminoclonazepam: NEGATIVE ng/mL (ref <25)
Amphetamines: NEGATIVE ng/mL (ref <500)
Barbiturates: NEGATIVE ng/mL (ref <300)
Benzodiazepines: POSITIVE ng/mL — AB (ref <100)
Cocaine Metabolite: NEGATIVE ng/mL (ref <150)
Desmethyltramadol: NEGATIVE ng/mL (ref <100)
Hydroxyethylflurazepam: NEGATIVE ng/mL (ref <50)
Lorazepam: 639 ng/mL — ABNORMAL HIGH (ref <50)
Nordiazepam: NEGATIVE ng/mL (ref <50)
Opiates: NEGATIVE ng/mL (ref <100)
Oxazepam: NEGATIVE ng/mL (ref <50)
Oxycodone: NEGATIVE ng/mL (ref <100)
Temazepam: NEGATIVE ng/mL (ref <50)
Tramadol: NEGATIVE ng/mL (ref <100)

## 2022-02-04 LAB — DM TEMPLATE

## 2022-02-05 ENCOUNTER — Ambulatory Visit: Payer: Medicare HMO | Admitting: Internal Medicine

## 2022-02-05 ENCOUNTER — Other Ambulatory Visit: Payer: Self-pay

## 2022-02-05 ENCOUNTER — Encounter: Payer: Self-pay | Admitting: Internal Medicine

## 2022-02-05 VITALS — BP 112/72 | HR 105 | Ht 65.5 in | Wt 130.0 lb

## 2022-02-05 DIAGNOSIS — R195 Other fecal abnormalities: Secondary | ICD-10-CM

## 2022-02-05 DIAGNOSIS — K200 Eosinophilic esophagitis: Secondary | ICD-10-CM

## 2022-02-05 DIAGNOSIS — C50911 Malignant neoplasm of unspecified site of right female breast: Secondary | ICD-10-CM

## 2022-02-05 NOTE — Patient Instructions (Signed)
If you are age 80 or older, your body mass index should be between 23-30. Your Body mass index is 21.3 kg/m?[name] If this is out of the aforementioned range listed, please consider follow up with your Primary Care Provider. ? ?If you are age 71 or younger, your body mass index should be between 19-25. Your Body mass index is 21.3 kg/m?[name] If this is out of the aformentioned range listed, please consider follow up with your Primary Care Provider.  ? ?________________________________________________________ ? ?The [facility] providers would like to encourage you to use [facility] to communicate with providers for non-urgent requests or questions.  Due to long hold times on the telephone, sending your provider a message by [facility] may be a faster and more efficient way to get a response.  Please allow 48 business hours for a response.  Please remember that this is for non-urgent requests.  ?_______________________________________________________ ? ?I appreciate the opportunity to care for you. ?Silvano Rusk, MD, [facility] ?

## 2022-02-05 NOTE — Progress Notes (Signed)
? ?Anne [name] 80 y.o. 01/21/1941 [id number] ? ?Assessment & Plan:  ? ?Encounter Diagnoses  ?Name Primary?  ? Abnormal feces - increased mucous Yes  ? Eosinophilic esophagitis   ? ? ?I suspect this mucus production is a manifestation of her IBS.  She was reassured and excepting of that today.  No further intervention. ? ?She knows to contact me if dysphagia with EOE worsens. ? ?See me as needed ? ? ? ?Subjective:  ? ?Chief Complaint: ? ?HPI ?80-year-old white woman with a history of metastatic breast cancer followed at [facility], eosinophilic esophagitis with recurrent strictures and last EGD and dilation in October 2022, who also has a diagnosis of IBS with constipation.  She messaged me as below and has the same complaints today. ?MyChart message 01/12/2022-I recommended office visit ?After passing a stool and when cleaning, there is a thin yellow mucous on the tissue and no evidence of the stool.  When I was under the care of Dr. Feng, oncologist, in 2021, she stated the mucous was nothing serious.   ?I have no abdominal pain, and rarely ever have gas.  I eat a salad everyday and stopped having to use laxatives or stool softeners over a year ago.  I have had abdominal MRIs at [facility] two or three times and the results are seen in ?Tests? at [facility].  My stools are soft to hard and dark brown, not black.  And the occurrence is every three to four days. ?  ?I hope you don?t recommend a colonoscopy.  My internet research suggests possibly a bacterial infection.  My blood tests are stable as seen in lab results at [facility]. ?  ?Hope you are doing well, and thank you for your time. ?  ? ?She reports that as long as she is careful about her chewing and swallowing she is not having significant dysphagia with her EOE. ?Unfortunately her husband has developed significant dementia which is another stressor.  She also has noticed more chest wall lesions with her breast cancer and has follow-up next month at [facility]. ? ?12/20/2021 [facility]  oncology visit assessment and plan ? ?1. Hormone receptor positive, HER-2 negative, metastatic breast cancer involving skin. She started tamoxifen in early January and skin nodules are stable by exam today. She will continue tamoxifen.  ? ?Whole-body bone scan - 11/09/2021-[facility] ? ?CT chest abdomen pelvis with contrast-[facility]-11/09/2021 ?Impression:  ?Stable exam with unchanged appearance of ill-defined hypoattenuating lesion  ?in the spleen, mildly enlarged porta hepatis lymph node, and anterior chest  ?wall nodular skin thickening. ? ?MRI brain with and without contrast 11/03/2021 [facility] ?IMPRESSION:  ?Unchanged enhancing lesion in the left parietal bone. No evidence of  ?intracranial metastatic disease.  ?MRI C-spine 11/03/2021-[facility] no evidence of cervical metastatic disease ?EGD 08/29/2021 ?- Severe Schatzki ring. Dilated. ?- Esophageal mucosal changes secondary to eosinophilic esophagitis. ?- Benign-appearing esophageal stenosis. ?- Normal cardia, gastric fundus, gastric body and antrum. ?- No specimens collected. ? ?Colonoscopy 10/17/2018 ?- The examined portion of the ileum was normal. ?- Seven colonic angiodysplastic lesions. ?- One 8 mm polyp in the cecum, removed with a cold snare. Resected and retrieved.-Adenoma ?- One 4 mm polyp in the transverse colon, removed with a cold snare. Resected and ?retrieved.-Adenoma ?- Redundant colon. ?- Lipoma in the transverse colon. ?- The examination was otherwise normal. ?Overall, suspect AVMs are the cause of the anemia / FIT test. ?Allergies  ?Allergen Reactions  ? Codeine Other (See Comments)  ?  "flu like symptoms" ?Causes depression  ?   Morphine Other (See Comments)  ?  "flu like symptoms"  ? Peanut-Containing Drug Products Hives  ? Sorbitol Other (See Comments)  ?  GI Issues  ? Tomato Diarrhea  ? Zofran Other (See Comments)  ?  headache  ? Advil [Ibuprofen] Other (See Comments)  ?  Irritates throat.  ? Cephalexin Other (See Comments)  ?  dizziness  ? Diphenhydramine Hcl  Other (See Comments)  ?  "nervous and upset" does okay w/ cream  ? Morphine And Related Other (See Comments)  ?  Causes depression  ? Antihistamines, Chlorpheniramine-Type Anxiety  ? Oxycodone Anxiety  ?  Confusion with inability to think clearly  ? ?Current Meds  ?Medication Sig  ? acetaminophen (TYLENOL) 500 MG tablet Take 500-1,000 mg by mouth every 6 (six) hours as needed (for pain.).  ? amitriptyline (ELAVIL) 25 MG tablet TAKE 6 TABLETS(150 MG) BY MOUTH AT BEDTIME  ? aspirin 81 MG EC tablet Take 81 mg by mouth daily.  ? atorvastatin (LIPITOR) 20 MG tablet Take 1 tablet by mouth in the morning and at bedtime. Patient need a appointment for future refills.  ? Biotin 5 MG CAPS Take by mouth.  ? Cholecalciferol (VITAMIN D3) 1000 UNITS CAPS Take 2,000 Units by mouth daily.  ? COVID-19 mRNA bivalent vaccine, Pfizer, (PFIZER COVID-19 VAC BIVALENT) injection Inject into the muscle.  ? Cyanocobalamin (VITAMIN B 12 PO) Take 1 tablet by mouth daily.  ? diltiazem (CARDIZEM) 30 MG tablet TAKE 1 TABLET BY MOUTH DAILY FOR PALPITATIONS  ? Docusate Sodium (DSS) 100 MG CAPS Take by mouth.  ? Ferrous Fumarate-Folic Acid 620-3 MG TABS Take 1 tablet by mouth daily.  ? FLUoxetine (PROZAC) 20 MG capsule Take 1 capsule (20 mg total) by mouth daily.  ? influenza vaccine adjuvanted (FLUAD) 0.5 ML injection Inject into the muscle.  ? loratadine (CLARITIN) 10 MG tablet Take 1 tablet (10 mg total) by mouth daily.  ? LORazepam (ATIVAN) 1 MG tablet Take 1 tablet (1 mg total) by mouth every 8 (eight) hours as needed for anxiety.  ? meclizine (ANTIVERT) 12.5 MG tablet Take 1 tablet by mouth as needed.  ? meloxicam (MOBIC) 15 MG tablet One tab PO qAM with a meal for 2 weeks, then daily prn pain.  ? mupirocin cream (BACTROBAN) 2 % Apply 1 application topically 2 (two) times daily.  ? nystatin cream (MYCOSTATIN) Apply 1 application topically 2 (two) times daily.  ? pantoprazole (PROTONIX) 40 MG tablet Take 1 tablet (40 mg total) by mouth 2 (two)  times daily before a meal.  ? permethrin (ELIMITE) 5 % cream Apply to affected area once, leave on for 8 to 14 hours then rinse completely, may repeat in 7 to 10 days if live mites are still present.  ? rOPINIRole (REQUIP) 0.25 MG tablet Take 1 tablet (0.25 mg total) by mouth at bedtime.  ? tamoxifen (NOLVADEX) 10 MG tablet Take 20 mg by mouth daily.  ? traMADol (ULTRAM) 50 MG tablet Take 0.5-1 tablets (25-50 mg total) by mouth every 8 (eight) hours as needed.  ? triamcinolone cream (KENALOG) 0.1 % Apply 1 application topically 2 (two) times daily.  ? ?Past Medical History:  ?Diagnosis Date  ? ALKALINE PHOSPHATASE, ELEVATED 03/15/2009  ? Allergic state 06/10/2012  ? Allergy   ? Anemia   ? Anxiety and depression 04/28/2011  ? Arthritis   ? Asthma   ? Atypical chest pain 11/30/2011  ? AVM (arteriovenous malformation) of colon 2011  ? cecum  ? Baker's  cyst of knee 05/22/2011  ? Cancer (HCC) 11/2006  ? XRT/chemo 01-02/ lobular invasive ca  ? Carotid artery disease (HCC)   ? a. Carotid duplex 03/2014: stable 1-39% BICA, f/u due 03/2016.  ? 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 Did not tolerate Klonopin, caused some confusion and bad dreams.    ? Clotting disorder (HCC)   ? COPD (chronic obstructive pulmonary disease) (HCC)   ? Dementia (HCC)   ? Dermatitis 11/23/2012  ? EE (eosinophilic esophagitis)   ? Emphysema of lung (HCC)   ? Esophageal ring   ? ESOPHAGEAL STRICTURE 03/29/2009  ? Fall 11/23/2012  ? Family history of breast cancer   ? Family history of colon cancer   ? Family history of ovarian cancer   ? Family history of pancreatic cancer   ? Folliculitis of nose 12/31/2011  ? GERD (gastroesophageal reflux disease) 09/29/2009  ? improved s/p cholecystectomy and esophagus dilatation  ? History of chicken pox   ? History of measles   ? History of shingles   ? 2 episodes  ? Hx of echocardiogram   ? a. Echo 01/2013: mild LVH, EF 55-60%, normal wall motion, Gr 1 diast dysfn  ? Hyperlipidemia   ? Knee pain,  bilateral 07/23/2011  ? Medicare annual wellness visit, subsequent 06/19/2015  ? Mixed hyperlipidemia 10/17/2010  ? Qualifier: Diagnosis of  By: Ferguson, Sharon    ? Orthostasis   ? Osteopenia 03/14/19

## 2022-02-20 ENCOUNTER — Encounter: Payer: Self-pay | Admitting: Family Medicine

## 2022-02-20 ENCOUNTER — Telehealth: Payer: Self-pay

## 2022-02-20 NOTE — Telephone Encounter (Signed)
Lvm to call to schedule ?

## 2022-02-20 NOTE — Telephone Encounter (Signed)
Pt says Holland Falling wants a Wellness check and a Physical.  She asks if her las office visit winch was a follow up that included labs satisfy this requirement?  ?

## 2022-02-20 NOTE — Telephone Encounter (Signed)
Pt is ready to schedule her AWV for insurance purposes per [employer].  ?

## 2022-02-21 NOTE — Telephone Encounter (Signed)
Mychart message sent back to pt

## 2022-02-22 NOTE — Telephone Encounter (Signed)
Scheduled

## 2022-02-26 ENCOUNTER — Other Ambulatory Visit: Payer: Self-pay

## 2022-02-26 DIAGNOSIS — C792 Secondary malignant neoplasm of skin: Secondary | ICD-10-CM | POA: Diagnosis not present

## 2022-02-26 DIAGNOSIS — C50911 Malignant neoplasm of unspecified site of right female breast: Secondary | ICD-10-CM | POA: Diagnosis not present

## 2022-02-26 MED ORDER — ATORVASTATIN CALCIUM 20 MG PO TABS: ORAL_TABLET | ORAL | 0 refills | Status: DC

## 2022-02-27 ENCOUNTER — Ambulatory Visit (INDEPENDENT_AMBULATORY_CARE_PROVIDER_SITE_OTHER): Payer: Medicare HMO

## 2022-02-27 VITALS — BP 111/72 | HR 94 | Temp 98.4°F | Resp 16 | Ht 65.0 in | Wt 131.2 lb

## 2022-02-27 DIAGNOSIS — Z0000 Encounter for general adult medical examination without abnormal findings: Secondary | ICD-10-CM

## 2022-02-27 NOTE — Patient Instructions (Signed)
Anne [name] , ?Thank you for taking time to come for your Medicare Wellness Visit. I appreciate your ongoing commitment to your health goals. Please review the following plan we discussed and let me know if I can assist you in the future.  ? ?Screening recommendations/referrals: ?Colonoscopy: no longer needed ?Mammogram: 06/02/21 due 06/02/22 ?Bone Density: 07/27/20 due 07/27/22 ?Recommended yearly ophthalmology/optometry visit for glaucoma screening and checkup ?Recommended yearly dental visit for hygiene and checkup ? ?Vaccinations: ?Influenza vaccine: up to date ?Pneumococcal vaccine: Due-May obtain vaccine at  your local pharmacy.  ?Tdap vaccine: up to date ?Shingles vaccine: Due-May obtain vaccine at your local pharmacy.    ?Covid-19:completed ? ?Advanced directives: no ? ?Conditions/risks identified: see problem list ? ?Next appointment: Follow up in one year for your annual wellness visit  ? ? ?Preventive Care 80 Years and Older, Female ?Preventive care refers to lifestyle choices and visits with your health care provider that can promote health and wellness. ?What does preventive care include? ?A yearly physical exam. This is also called an annual well check. ?Dental exams once or twice a year. ?Routine eye exams. Ask your health care provider how often you should have your eyes checked. ?Personal lifestyle choices, including: ?Daily care of your teeth and gums. ?Regular physical activity. ?Eating a healthy diet. ?Avoiding tobacco and drug use. ?Limiting alcohol use. ?Practicing safe sex. ?Taking low-dose aspirin every day. ?Taking vitamin and mineral supplements as recommended by your health care provider. ?What happens during an annual well check? ?The services and screenings done by your health care provider during your annual well check will depend on your age, overall health, lifestyle risk factors, and family history of disease. ?Counseling  ?Your health care provider may ask you questions about  your: ?Alcohol use. ?Tobacco use. ?Drug use. ?Emotional well-being. ?Home and relationship well-being. ?Sexual activity. ?Eating habits. ?History of falls. ?Memory and ability to understand (cognition). ?Work and work Statistician. ?Reproductive health. ?Screening  ?You may have the following tests or measurements: ?Height, weight, and BMI. ?Blood pressure. ?Lipid and cholesterol levels. These may be checked every 5 years, or more frequently if you are over 44 years old. ?Skin check. ?Lung cancer screening. You may have this screening every year starting at age 48 if you have a 30-pack-year history of smoking and currently smoke or have quit within the past 15 years. ?Fecal occult blood test (FOBT) of the stool. You may have this test every year starting at age 46. ?Flexible sigmoidoscopy or colonoscopy. You may have a sigmoidoscopy every 5 years or a colonoscopy every 10 years starting at age 54. ?Hepatitis C blood test. ?Hepatitis B blood test. ?Sexually transmitted disease (STD) testing. ?Diabetes screening. This is done by checking your blood sugar (glucose) after you have not eaten for a while (fasting). You may have this done every 1-3 years. ?Bone density scan. This is done to screen for osteoporosis. You may have this done starting at age 63. ?Mammogram. This may be done every 1-2 years. Talk to your health care provider about how often you should have regular mammograms. ?Talk with your health care provider about your test results, treatment options, and if necessary, the need for more tests. ?Vaccines  ?Your health care provider may recommend certain vaccines, such as: ?Influenza vaccine. This is recommended every year. ?Tetanus, diphtheria, and acellular pertussis (Tdap, Td) vaccine. You may need a Td booster every 10 years. ?Zoster vaccine. You may need this after age 51. ?Pneumococcal 13-valent conjugate (PCV13) vaccine. One dose is  recommended after age 67. ?Pneumococcal polysaccharide (PPSV23) vaccine.  One dose is recommended after age 98. ?Talk to your health care provider about which screenings and vaccines you need and how often you need them. ?This information is not intended to replace advice given to you by your health care provider. Make sure you discuss any questions you have with your health care provider. ?Document Released: 11/25/2015 Document Revised: 07/18/2016 Document Reviewed: 08/30/2015 ?Elsevier Interactive Patient Education ? 2017 [place]. ? ?Fall Prevention in the Home ?Falls can cause injuries. They can happen to people of all ages. There are many things you can do to make your home safe and to help prevent falls. ?What can I do on the outside of my home? ?Regularly fix the edges of walkways and driveways and fix any cracks. ?Remove anything that might make you trip as you walk through a door, such as a raised step or threshold. ?Trim any bushes or trees on the path to your home. ?Use bright outdoor lighting. ?Clear any walking paths of anything that might make someone trip, such as rocks or tools. ?Regularly check to see if handrails are loose or broken. Make sure that both sides of any steps have handrails. ?Any raised decks and porches should have guardrails on the edges. ?Have any leaves, snow, or ice cleared regularly. ?Use sand or salt on walking paths during winter. ?Clean up any spills in your garage right away. This includes oil or grease spills. ?What can I do in the bathroom? ?Use night lights. ?Install grab bars by the toilet and in the tub and shower. Do not use towel bars as grab bars. ?Use non-skid mats or decals in the tub or shower. ?If you need to sit down in the shower, use a plastic, non-slip stool. ?Keep the floor dry. Clean up any water that spills on the floor as soon as it happens. ?Remove soap buildup in the tub or shower regularly. ?Attach bath mats securely with double-sided non-slip rug tape. ?Do not have throw rugs and other things on the floor that can make  you trip. ?What can I do in the bedroom? ?Use night lights. ?Make sure that you have a light by your bed that is easy to reach. ?Do not use any sheets or blankets that are too big for your bed. They should not hang down onto the floor. ?Have a firm chair that has side arms. You can use this for support while you get dressed. ?Do not have throw rugs and other things on the floor that can make you trip. ?What can I do in the kitchen? ?Clean up any spills right away. ?Avoid walking on wet floors. ?Keep items that you use a lot in easy-to-reach places. ?If you need to reach something above you, use a strong step stool that has a grab bar. ?Keep electrical cords out of the way. ?Do not use floor polish or wax that makes floors slippery. If you must use wax, use non-skid floor wax. ?Do not have throw rugs and other things on the floor that can make you trip. ?What can I do with my stairs? ?Do not leave any items on the stairs. ?Make sure that there are handrails on both sides of the stairs and use them. Fix handrails that are broken or loose. Make sure that handrails are as long as the stairways. ?Check any carpeting to make sure that it is firmly attached to the stairs. Fix any carpet that is loose or worn. ?Avoid having  throw rugs at the top or bottom of the stairs. If you do have throw rugs, attach them to the floor with carpet tape. ?Make sure that you have a light switch at the top of the stairs and the bottom of the stairs. If you do not have them, ask someone to add them for you. ?What else can I do to help prevent falls? ?Wear shoes that: ?Do not have high heels. ?Have rubber bottoms. ?Are comfortable and fit you well. ?Are closed at the toe. Do not wear sandals. ?If you use a stepladder: ?Make sure that it is fully opened. Do not climb a closed stepladder. ?Make sure that both sides of the stepladder are locked into place. ?Ask someone to hold it for you, if possible. ?Clearly mark and make sure that you can  see: ?Any grab bars or handrails. ?First and last steps. ?Where the edge of each step is. ?Use tools that help you move around (mobility aids) if they are needed. These include: ?Canes. ?Walkers. ?Scooters. ?Crutches.

## 2022-02-27 NOTE — Progress Notes (Signed)
No  ? ?Subjective:  ? Anne [name] is a 81 y.o. female who presents for Medicare Annual (Subsequent) preventive examination. ? ?Review of Systems    ? ?Cardiac Risk Factors include: advanced age (>34mn, >>73women);dyslipidemia ? ?   ?Objective:  ?  ?Today's Vitals  ? 02/27/22 1449 02/27/22 1455  ?BP: 111/72   ?Pulse: 94   ?Resp: 16   ?Temp: 98.4 ?F (36.9 ?C)   ?SpO2: 96%   ?Weight: 131 lb 3.2 oz (59.5 kg)   ?Height: '5\' 5"'$  (1.651 m)   ?PainSc:  0-No pain  ? ?Body mass index is 21.83 kg/m?. ? ? ?  02/27/2022  ?  2:52 PM 10/22/2021  ? 10:28 PM 04/13/2021  ? 12:43 AM 12/01/2020  ?  2:13 PM 05/25/2020  ?  1:31 PM 03/14/2020  ?  1:35 PM 12/11/2019  ? 10:59 AM  ?Advanced Directives  ?Does Patient Have a Medical Advance Directive? No No No No No No No  ?Would patient like information on creating a medical advance directive? No - Patient declined No - Patient declined  No - Patient declined     ? ? ?Current Medications (verified) ?Outpatient Encounter Medications as of 02/27/2022  ?Medication Sig  ? acetaminophen (TYLENOL) 500 MG tablet Take 500-1,000 mg by mouth every 6 (six) hours as needed (for pain.).  ? amitriptyline (ELAVIL) 25 MG tablet TAKE 6 TABLETS(150 MG) BY MOUTH AT BEDTIME  ? aspirin 81 MG EC tablet Take 81 mg by mouth daily.  ? atorvastatin (LIPITOR) 20 MG tablet Take 1 tablet by mouth in the morning and at bedtime. Patient need a appointment for future refills.  ? Biotin 5 MG CAPS Take by mouth.  ? Cholecalciferol (VITAMIN D3) 1000 UNITS CAPS Take 2,000 Units by mouth daily.  ? COVID-19 mRNA bivalent vaccine, Pfizer, (PFIZER COVID-19 VAC BIVALENT) injection Inject into the muscle.  ? Cyanocobalamin (VITAMIN B 12 PO) Take 1 tablet by mouth daily.  ? diltiazem (CARDIZEM) 30 MG tablet TAKE 1 TABLET BY MOUTH DAILY FOR PALPITATIONS  ? Docusate Sodium (DSS) 100 MG CAPS Take by mouth.  ? Ferrous Fumarate-Folic Acid 3952-8MG TABS Take 1 tablet by mouth daily.  ? FLUoxetine (PROZAC) 20 MG capsule Take 1 capsule (20 mg  total) by mouth daily.  ? influenza vaccine adjuvanted (FLUAD) 0.5 ML injection Inject into the muscle.  ? loratadine (CLARITIN) 10 MG tablet Take 1 tablet (10 mg total) by mouth daily.  ? LORazepam (ATIVAN) 1 MG tablet Take 1 tablet (1 mg total) by mouth every 8 (eight) hours as needed for anxiety.  ? meclizine (ANTIVERT) 12.5 MG tablet Take 1 tablet by mouth as needed.  ? meloxicam (MOBIC) 15 MG tablet One tab PO qAM with a meal for 2 weeks, then daily prn pain.  ? mupirocin cream (BACTROBAN) 2 % Apply 1 application topically 2 (two) times daily.  ? nystatin cream (MYCOSTATIN) Apply 1 application topically 2 (two) times daily.  ? pantoprazole (PROTONIX) 40 MG tablet Take 1 tablet (40 mg total) by mouth 2 (two) times daily before a meal.  ? permethrin (ELIMITE) 5 % cream Apply to affected area once, leave on for 8 to 14 hours then rinse completely, may repeat in 7 to 10 days if live mites are still present.  ? rOPINIRole (REQUIP) 0.25 MG tablet Take 1 tablet (0.25 mg total) by mouth at bedtime.  ? tamoxifen (NOLVADEX) 10 MG tablet Take 20 mg by mouth daily.  ? traMADol (ULTRAM) 50 MG  tablet Take 0.5-1 tablets (25-50 mg total) by mouth every 8 (eight) hours as needed.  ? triamcinolone cream (KENALOG) 0.1 % Apply 1 application topically 2 (two) times daily.  ? ?No facility-administered encounter medications on file as of 02/27/2022.  ? ? ?Allergies (verified) ?Codeine; Morphine; Peanut-containing drug products; Sorbitol; Tomato; Zofran; Advil [ibuprofen]; Cephalexin; Diphenhydramine hcl; Morphine and related; Antihistamines, chlorpheniramine-type; and Oxycodone  ? ?History: ?Past Medical History:  ?Diagnosis Date  ? ALKALINE PHOSPHATASE, ELEVATED 03/15/2009  ? Allergic state 06/10/2012  ? Allergy   ? Anemia   ? Anxiety and depression 04/28/2011  ? Arthritis   ? Asthma   ? Atypical chest pain 11/30/2011  ? AVM (arteriovenous malformation) of colon 2011  ? cecum  ? Baker's cyst of knee 05/22/2011  ? Cancer ([place]) 11/2006   ? XRT/chemo 01-02/ lobular invasive ca  ? Carotid artery disease ([place])   ? a. Carotid duplex 03/2014: stable 1-39% BICA, f/u due 03/2016.  ? 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 Did not tolerate Klonopin, caused some confusion and bad dreams.    ? Clotting disorder ([place])   ? COPD (chronic obstructive pulmonary disease) ([place])   ? Dementia ([place])   ? Dermatitis 11/23/2012  ? EE (eosinophilic esophagitis)   ? Emphysema of lung ([place])   ? Esophageal ring   ? ESOPHAGEAL STRICTURE 03/29/2009  ? Fall 11/23/2012  ? Family history of breast cancer   ? Family history of colon cancer   ? Family history of ovarian cancer   ? Family history of pancreatic cancer   ? Folliculitis of nose 81/44/8185  ? GERD (gastroesophageal reflux disease) 09/29/2009  ? improved s/p cholecystectomy and esophagus dilatation  ? History of chicken pox   ? History of measles   ? History of shingles   ? 2 episodes  ? Hx of echocardiogram   ? a. Echo 01/2013: mild LVH, EF 55-60%, normal wall motion, Gr 1 diast dysfn  ? Hyperlipidemia   ? Knee pain, bilateral 07/23/2011  ? Medicare annual wellness visit, subsequent 06/19/2015  ? Mixed hyperlipidemia 10/17/2010  ? Qualifier: Diagnosis of  By: Mack Guise    ? Orthostasis   ? Osteopenia 03/14/2011  ? Osteoporosis   ? Personal history of chemotherapy 2001  ? Personal history of radiation therapy 2001  ? rt breast  ? PERSONAL HX BREAST CANCER 09/29/2009  ? PVC's (premature ventricular contractions)   ? a. Event monitor 01/2013: NSR, extensive PVCs.  ? Radial neck fracture 10/2011  ? minimally displaced  ? Substance abuse ([place])   ? In remission 3 years.   ? Urinary incontinence 03/19/2012  ? Vaginitis 05/22/2011  ? ?Past Surgical History:  ?Procedure Laterality Date  ? APPENDECTOMY    ? AUGMENTATION MAMMAPLASTY Right 05/27/2007  ? BREAST BIOPSY Right 01/02/2007  ? wire loc  ? BREAST BIOPSY  12/26/2006  ? BREAST LUMPECTOMY Right 2001  ? BREAST RECONSTRUCTION  2008, 2009, 2010  ? BREAST REDUCTION  WITH MASTOPEXY Left 05/30/2017  ? Procedure: LEFT BREAST REDUCTION FOR SYMTERY WITH MASTOPEXY;  Surgeon: Wallace Going, DO;  Location: [facility];  Service: Plastics;  Laterality: Left;  ? CHOLECYSTECTOMY  2010  ? COLONOSCOPY  09/05/10  ? cecal avm's  ? DENTAL SURGERY  05/2016  ? 4 dental implants by Dr. Loyal Gambler.  ? ERCP  2010  ?  CBD stone extraction   ? ESOPHAGOGASTRODUODENOSCOPY  01/08/2012  ? Procedure: ESOPHAGOGASTRODUODENOSCOPY (EGD);  Surgeon: Gatha Mayer, MD;  Location:  [facility];  Service: Endoscopy;  Laterality: N/A;  ? ESOPHAGOGASTRODUODENOSCOPY (EGD) WITH ESOPHAGEAL DILATION  2010, 2012  ? LAPAROSCOPIC APPENDECTOMY N/A 08/04/2016  ? Procedure: APPENDECTOMY LAPAROSCOPIC;  Surgeon: Michael Boston, MD;  Location: [facility];  Service: General;  Laterality: N/A;  ? LIPOSUCTION WITH LIPOFILLING Left 11/21/2017  ? Procedure: LIPOSUCTION FROM ABDOMEN WITH LIPOFILLING TO LEFT BREAST;  Surgeon: Wallace Going, DO;  Location: [facility];  Service: Plastics;  Laterality: Left;  ? MASTECTOMY MODIFIED RADICAL Right 05/27/2007  ? , Mastectomy modified radical (08), breast reconstruction, CA lesions excised lateral abd wall 2010  ? MASTOPEXY Left 11/21/2017  ? Procedure: LEFT BREAST REVISION MASTOPEXY FOR SYMMETRY;  Surgeon: Wallace Going, DO;  Location: [facility];  Service: Plastics;  Laterality: Left;  ? REDUCTION MAMMAPLASTY Left   ? SAVORY DILATION  01/08/2012  ? Procedure: SAVORY DILATION;  Surgeon: Gatha Mayer, MD;  [facility];  Service: Endoscopy;  Laterality: N/A;  need xray  ? ?Family History  ?Problem Relation Age of Onset  ? Other Mother   ?     tic douloureux  ? Heart disease Father   ? Lung cancer Father   ?     smoker  ? Cirrhosis Sister   ?     Primary Biliary  ? Anxiety disorder Sister   ? Osteoporosis Sister   ? Arthritis Sister   ?     Rheumatoid  ? Osteoporosis Sister   ? Skin cancer Sister   ?     multiple skin cancers, over  62 excisions.  ? Stroke Maternal Grandmother   ? Alcohol abuse Maternal Grandfather   ? Heart disease Paternal Grandfather   ? Esophageal cancer Paternal Grandfather   ? Rectal cancer Paternal Grandfather

## 2022-03-01 ENCOUNTER — Encounter: Payer: Self-pay | Admitting: Family

## 2022-03-01 ENCOUNTER — Ambulatory Visit (INDEPENDENT_AMBULATORY_CARE_PROVIDER_SITE_OTHER): Payer: Medicare HMO | Admitting: Family

## 2022-03-01 VITALS — BP 100/60 | HR 91 | Temp 98.0°F | Ht 64.0 in | Wt 132.8 lb

## 2022-03-01 DIAGNOSIS — Z0000 Encounter for general adult medical examination without abnormal findings: Secondary | ICD-10-CM | POA: Diagnosis not present

## 2022-03-01 NOTE — Progress Notes (Signed)
?Anne [name] is a 81 y.o. female with the following history as recorded in EpicCare:  ?Patient Active Problem List  ? Diagnosis Date Noted  ? Memory changes 02/03/2022  ? Primary osteoarthritis of right hip 04/27/2021  ? Insomnia 12/28/2020  ? Vertigo 12/28/2020  ? Emphysema lung ([place]) 06/06/2020  ? RLS (restless legs syndrome) 02/24/2020  ? 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 Facial trauma, sequela 08/09/2019  ? Hyperglycemia 08/19/2018  ? Mild intermittent asthma without complication 70/62/3762  ? Asthma 04/30/2018  ? 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 Palpitations 04/30/2018  ? Skin lesion of face 02/07/2018  ? Acute right-sided low back pain 09/23/2017  ? Hypokalemia 08/11/2017  ? Abdominal pain 08/11/2017  ? SBO (small bowel obstruction) ([place]) 08/10/2017  ? History of right breast cancer 03/21/2017  ? Genetic testing 09/13/2016  ? Family history of breast cancer   ? Family history of pancreatic cancer   ? Family history of colon cancer   ? Pain in the chest 05/16/2016  ? Status post right breast reconstruction 05/11/2016  ? Breast cancer 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 ([place]) 05/11/2016  ? History of breast cancer in female 05/11/2016  ? Osteopenia determined by x-ray 05/11/2016  ? IBS (irritable bowel syndrome) 03/09/2016  ? Dyspnea 06/19/2015  ? Medicare annual wellness visit, subsequent 06/19/2015  ? Urinary frequency 12/12/2014  ? Breast cancer, right breast ([place]) 10/18/2014  ? Superficial thrombophlebitis 03/16/2014  ? Plant dermatitis 03/16/2014  ? Chest wall pain 02/07/2014  ? Primary osteoarthritis of both hips 01/21/2014  ? Elevated sed rate 08/02/2013  ? Dermatitis 11/23/2012  ? Falls 11/23/2012  ? Preventative health care 10/21/2012  ? Allergy 06/10/2012  ? Urinary incontinence 03/19/2012  ? Anemia 12/21/2011  ? Knee pain, bilateral 07/23/2011  ? Baker's cyst of knee 05/22/2011  ? Eosinophilic esophagitis 83/15/1761  ? Anxiety and depression 04/28/2011  ? Chronic alcoholism in remission  ([place]) 03/29/2011  ? Constipation, chronic 03/15/2011  ? Mixed hyperlipidemia 10/17/2010  ? CAROTID ARTERY STENOSIS 01/13/2010  ? GERD 09/29/2009  ? PERSONAL HX BREAST CANCER 09/29/2009  ?  ?Current Outpatient Medications  ?Medication Sig Dispense Refill  ? acetaminophen (TYLENOL) 500 MG tablet Take 500-1,000 mg by mouth every 6 (six) hours as needed (for pain.).    ? amitriptyline (ELAVIL) 25 MG tablet TAKE 6 TABLETS(150 MG) BY MOUTH AT BEDTIME (Patient taking differently: Per patient, taking 4 per night) 180 tablet 5  ? aspirin 81 MG EC tablet Take 81 mg by mouth daily.    ? atorvastatin (LIPITOR) 20 MG tablet Take 1 tablet by mouth in the morning and at bedtime. Patient need a appointment for future refills. 120 tablet 0  ? Biotin 5 MG CAPS Take by mouth.    ? Cholecalciferol (VITAMIN D3) 1000 UNITS CAPS Take 2,000 Units by mouth daily.    ? Cyanocobalamin (VITAMIN B 12 PO) Take 1 tablet by mouth daily.    ? diltiazem (CARDIZEM) 30 MG tablet TAKE 1 TABLET BY MOUTH DAILY FOR PALPITATIONS 90 tablet 3  ? Docusate Sodium (DSS) 100 MG CAPS Take by mouth.    ? Ferrous Fumarate-Folic Acid 607-3 MG TABS Take 1 tablet by mouth daily. 30 each 3  ? FLUoxetine (PROZAC) 20 MG capsule Take 1 capsule (20 mg total) by mouth daily. 30 capsule 3  ? loratadine (CLARITIN) 10 MG tablet Take 1 tablet (10 mg total) by mouth daily. 30 tablet 11  ? LORazepam (ATIVAN) 1 MG tablet Take 1 tablet (1 mg total)  by mouth every 8 (eight) hours as needed for anxiety. 90 tablet 1  ? meloxicam (MOBIC) 15 MG tablet One tab PO qAM with a meal for 2 weeks, then daily prn pain. 30 tablet 3  ? pantoprazole (PROTONIX) 40 MG tablet Take 1 tablet (40 mg total) by mouth 2 (two) times daily before a meal. 180 tablet 3  ? tamoxifen (NOLVADEX) 10 MG tablet Take 20 mg by mouth daily.    ? meclizine (ANTIVERT) 12.5 MG tablet Take 1 tablet by mouth as needed. (Patient not taking: Reported on 03/01/2022)    ? mupirocin cream (BACTROBAN) 2 % Apply 1 application  topically 2 (two) times daily. (Patient not taking: Reported on 03/01/2022) 15 g 0  ? ?No current facility-administered medications for this visit.  ?  ?Allergies: Codeine; Morphine; Peanut-containing drug products; Sorbitol; Tomato; Zofran; Advil [ibuprofen]; Cephalexin; Diphenhydramine hcl; Morphine and related; Antihistamines, chlorpheniramine-type; and Oxycodone  ?Past Medical History:  ?Diagnosis Date  ? ALKALINE PHOSPHATASE, ELEVATED 03/15/2009  ? Allergic state 06/10/2012  ? Allergy   ? Anemia   ? Anxiety and depression 04/28/2011  ? Arthritis   ? Asthma   ? Atypical chest pain 11/30/2011  ? AVM (arteriovenous malformation) of colon 2011  ? cecum  ? Baker's cyst of knee 05/22/2011  ? Cancer ([place]) 11/2006  ? XRT/chemo 01-02/ lobular invasive ca  ? Carotid artery disease ([place])   ? a. Carotid duplex 03/2014: stable 1-39% BICA, f/u due 03/2016.  ? 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 Did not tolerate Klonopin, caused some confusion and bad dreams.    ? Clotting disorder ([place])   ? COPD (chronic obstructive pulmonary disease) ([place])   ? Dementia ([place])   ? Dermatitis 11/23/2012  ? EE (eosinophilic esophagitis)   ? Emphysema of lung ([place])   ? Esophageal ring   ? ESOPHAGEAL STRICTURE 03/29/2009  ? Fall 11/23/2012  ? Family history of breast cancer   ? Family history of colon cancer   ? Family history of ovarian cancer   ? Family history of pancreatic cancer   ? Folliculitis of nose 50/93/2671  ? GERD (gastroesophageal reflux disease) 09/29/2009  ? improved s/p cholecystectomy and esophagus dilatation  ? History of chicken pox   ? History of measles   ? History of shingles   ? 2 episodes  ? Hx of echocardiogram   ? a. Echo 01/2013: mild LVH, EF 55-60%, normal wall motion, Gr 1 diast dysfn  ? Hyperlipidemia   ? Knee pain, bilateral 07/23/2011  ? Medicare annual wellness visit, subsequent 06/19/2015  ? Mixed hyperlipidemia 10/17/2010  ? Qualifier: Diagnosis of  By: Mack Guise    ? Orthostasis   ?  Osteopenia 03/14/2011  ? Osteoporosis   ? Personal history of chemotherapy 2001  ? Personal history of radiation therapy 2001  ? rt breast  ? PERSONAL HX BREAST CANCER 09/29/2009  ? PVC's (premature ventricular contractions)   ? a. Event monitor 01/2013: NSR, extensive PVCs.  ? Radial neck fracture 10/2011  ? minimally displaced  ? Substance abuse ([place])   ? In remission 3 years.   ? Urinary incontinence 03/19/2012  ? Vaginitis 05/22/2011  ?  ?Past Surgical History:  ?Procedure Laterality Date  ? APPENDECTOMY    ? AUGMENTATION MAMMAPLASTY Right 05/27/2007  ? BREAST BIOPSY Right 01/02/2007  ? wire loc  ? BREAST BIOPSY  12/26/2006  ? BREAST LUMPECTOMY Right 2001  ? BREAST RECONSTRUCTION  2008, 2009, 2010  ? BREAST REDUCTION WITH  MASTOPEXY Left 05/30/2017  ? Procedure: LEFT BREAST REDUCTION FOR SYMTERY WITH MASTOPEXY;  Surgeon: Wallace Going, DO;  Location: [facility];  Service: Plastics;  Laterality: Left;  ? CHOLECYSTECTOMY  2010  ? COLONOSCOPY  09/05/10  ? cecal avm's  ? DENTAL SURGERY  05/2016  ? 4 dental implants by Dr. Loyal Gambler.  ? ERCP  2010  ?  CBD stone extraction   ? ESOPHAGOGASTRODUODENOSCOPY  01/08/2012  ? Procedure: ESOPHAGOGASTRODUODENOSCOPY (EGD);  Surgeon: Gatha Mayer, MD;  Location: [facility];  Service: Endoscopy;  Laterality: N/A;  ? ESOPHAGOGASTRODUODENOSCOPY (EGD) WITH ESOPHAGEAL DILATION  2010, 2012  ? LAPAROSCOPIC APPENDECTOMY N/A 08/04/2016  ? Procedure: APPENDECTOMY LAPAROSCOPIC;  Surgeon: Michael Boston, MD;  [facility];  Service: General;  Laterality: N/A;  ? LIPOSUCTION WITH LIPOFILLING Left 11/21/2017  ? Procedure: LIPOSUCTION FROM ABDOMEN WITH LIPOFILLING TO LEFT BREAST;  Surgeon: Wallace Going, DO;  Location: [facility];  Service: Plastics;  Laterality: Left;  ? MASTECTOMY MODIFIED RADICAL Right 05/27/2007  ? , Mastectomy modified radical (08), breast reconstruction, CA lesions excised lateral abd wall 2010  ? MASTOPEXY Left 11/21/2017  ?  Procedure: LEFT BREAST REVISION MASTOPEXY FOR SYMMETRY;  Surgeon: Wallace Going, DO;  Location: [facility];  Service: Plastics;  Laterality: Left;  ? REDUCTION MAMMAPLASTY Left   ? SAVORY DILATION

## 2022-03-04 ENCOUNTER — Other Ambulatory Visit: Payer: Self-pay | Admitting: Adult Health

## 2022-03-07 ENCOUNTER — Encounter: Payer: Self-pay | Admitting: Family Medicine

## 2022-03-08 ENCOUNTER — Ambulatory Visit (INDEPENDENT_AMBULATORY_CARE_PROVIDER_SITE_OTHER): Payer: Medicare HMO | Admitting: Family Medicine

## 2022-03-08 ENCOUNTER — Ambulatory Visit (INDEPENDENT_AMBULATORY_CARE_PROVIDER_SITE_OTHER): Payer: Medicare HMO | Admitting: Sports Medicine

## 2022-03-08 ENCOUNTER — Encounter: Payer: Self-pay | Admitting: Family Medicine

## 2022-03-08 ENCOUNTER — Ambulatory Visit (INDEPENDENT_AMBULATORY_CARE_PROVIDER_SITE_OTHER): Payer: Medicare HMO

## 2022-03-08 VITALS — BP 110/72 | HR 89 | Temp 97.8°F | Resp 18 | Ht 64.0 in | Wt 133.0 lb

## 2022-03-08 DIAGNOSIS — M1611 Unilateral primary osteoarthritis, right hip: Secondary | ICD-10-CM | POA: Diagnosis not present

## 2022-03-08 DIAGNOSIS — F322 Major depressive disorder, single episode, severe without psychotic features: Secondary | ICD-10-CM | POA: Diagnosis not present

## 2022-03-08 DIAGNOSIS — R69 Illness, unspecified: Secondary | ICD-10-CM | POA: Diagnosis not present

## 2022-03-08 DIAGNOSIS — M160 Bilateral primary osteoarthritis of hip: Secondary | ICD-10-CM

## 2022-03-08 DIAGNOSIS — R300 Dysuria: Secondary | ICD-10-CM

## 2022-03-08 LAB — POC URINALSYSI DIPSTICK (AUTOMATED)
Bilirubin, UA: NEGATIVE
Blood, UA: NEGATIVE
Glucose, UA: NEGATIVE
Ketones, UA: NEGATIVE
Leukocytes, UA: NEGATIVE
Nitrite, UA: NEGATIVE
Protein, UA: NEGATIVE
Spec Grav, UA: 1.01 (ref 1.010–1.025)
Urobilinogen, UA: 0.2 E.U./dL
pH, UA: 7 (ref 5.0–8.0)

## 2022-03-08 MED ORDER — FLUOXETINE HCL 40 MG PO CAPS: 40.0000 mg | ORAL_CAPSULE | Freq: Every day | ORAL | 3 refills | Status: DC

## 2022-03-08 NOTE — Assessment & Plan Note (Signed)
Inc prozac to 40 mg  ?Refer to counseling  ?Pt has app in June with pcp-- she was instructed to call sooner if her symptoms worsen  ?

## 2022-03-08 NOTE — Telephone Encounter (Signed)
Called Pt and appointment was made for today  ?

## 2022-03-08 NOTE — Progress Notes (Addendum)
? ?Subjective:  ? ?By signing my name below, I, Shehryar Baig, attest that this documentation has been prepared under the direction and in the presence of Ann Held, DO. 03/08/2022 ?  ? ? Patient ID: Anne [name], female    DOB: 1941-11-09, 81 y.o.   MRN: [id number] ? ?Chief Complaint  ?Patient presents with  ? Depression  ? ? ?Depression ?       Associated symptoms include no headaches and no suicidal ideas. ?Patient is in today for a office visit.  ? ?She complains of feeling depressed. She felt this way for most of her life but found recently it worsened due to her cancer and fear of dying. Otherwise there was no other situation which caused her to feel this way. She is not thinking of hurting herself or others. She is also living alone at home and feels lonely. She has no friends or organizations around her. She is not seeing a counselor at this time. She continues taking 20 mg prozac daily PO and reports no imporvement while taking it. She is requesting a refill on it as well. She notes last month she increased her dosage from 10 mg to 20 mg. She has taken amitriptyline since 1981 to manage her symptoms but reduced her dosage due to having hallucinations. 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  ?She also complains of an episode of burning while urinating last night before sleeping. This morning she found her symptoms resolved.  ? ? ?Past Medical History:  ?Diagnosis Date  ? ALKALINE PHOSPHATASE, ELEVATED 03/15/2009  ? Allergic state 06/10/2012  ? Allergy   ? Anemia   ? Anxiety and depression 04/28/2011  ? Arthritis   ? Asthma   ? Atypical chest pain 11/30/2011  ? AVM (arteriovenous malformation) of colon 2011  ? cecum  ? Baker's cyst of knee 05/22/2011  ? Cancer ([place]) 11/2006  ? XRT/chemo 01-02/ lobular invasive ca  ? Carotid artery disease ([place])   ? a. Carotid duplex 03/2014: stable 1-39% BICA, f/u due 03/2016.  ? Chronic alcoholism in remission  ([place]) 03/29/2011  ? Did not tolerate Klonopin, caused some confusion and bad dreams.    ? Clotting disorder ([place])   ? COPD (chronic obstructive pulmonary disease) ([place])   ? Dementia ([place])   ? Dermatitis 11/23/2012  ? EE (eosinophilic esophagitis)   ? Emphysema of lung ([place])   ? Esophageal ring   ? ESOPHAGEAL STRICTURE 03/29/2009  ? Fall 11/23/2012  ? Family history of breast cancer   ? Family history of colon cancer   ? Family history of ovarian cancer   ? Family history of pancreatic cancer   ? Folliculitis of nose 35/57/3220  ? GERD (gastroesophageal reflux disease) 09/29/2009  ? improved s/p cholecystectomy and esophagus dilatation  ? History of chicken pox   ? History of measles   ? History of shingles   ? 2 episodes  ? Hx of echocardiogram   ? a. Echo 01/2013: mild LVH, EF 55-60%, normal wall motion, Gr 1 diast dysfn  ? Hyperlipidemia   ? Knee pain, bilateral 07/23/2011  ? Medicare annual wellness visit, subsequent 06/19/2015  ? Mixed hyperlipidemia 10/17/2010  ? Qualifier: Diagnosis of  By: Mack Guise    ? Orthostasis   ? Osteopenia 03/14/2011  ? Osteoporosis   ? Personal history of chemotherapy 2001  ? Personal history of radiation therapy 2001  ? rt breast  ?  PERSONAL HX BREAST CANCER 09/29/2009  ? PVC's (premature ventricular contractions)   ? a. Event monitor 01/2013: NSR, extensive PVCs.  ? Radial neck fracture 10/2011  ? minimally displaced  ? Substance abuse ([place])   ? In remission 3 years.   ? Urinary incontinence 03/19/2012  ? Vaginitis 05/22/2011  ? ? ?Past Surgical History:  ?Procedure Laterality Date  ? APPENDECTOMY    ? AUGMENTATION MAMMAPLASTY Right 05/27/2007  ? BREAST BIOPSY Right 01/02/2007  ? wire loc  ? BREAST BIOPSY  12/26/2006  ? BREAST LUMPECTOMY Right 2001  ? BREAST RECONSTRUCTION  2008, 2009, 2010  ? BREAST REDUCTION WITH MASTOPEXY Left 05/30/2017  ? Procedure: LEFT BREAST REDUCTION FOR SYMTERY WITH MASTOPEXY;  Surgeon: Wallace Going, DO;  Location: [facility];   Service: Plastics;  Laterality: Left;  ? CHOLECYSTECTOMY  2010  ? COLONOSCOPY  09/05/10  ? cecal avm's  ? DENTAL SURGERY  05/2016  ? 4 dental implants by Dr. Loyal Gambler.  ? ERCP  2010  ?  CBD stone extraction   ? ESOPHAGOGASTRODUODENOSCOPY  01/08/2012  ? Procedure: ESOPHAGOGASTRODUODENOSCOPY (EGD);  Surgeon: Gatha Mayer, MD;  Location: [facility];  Service: Endoscopy;  Laterality: N/A;  ? ESOPHAGOGASTRODUODENOSCOPY (EGD) WITH ESOPHAGEAL DILATION  2010, 2012  ? LAPAROSCOPIC APPENDECTOMY N/A 08/04/2016  ? Procedure: APPENDECTOMY LAPAROSCOPIC;  Surgeon: Michael Boston, MD;  Location: [facility];  Service: General;  Laterality: N/A;  ? LIPOSUCTION WITH LIPOFILLING Left 11/21/2017  ? Procedure: LIPOSUCTION FROM ABDOMEN WITH LIPOFILLING TO LEFT BREAST;  Surgeon: Wallace Going, DO;  Location: [facility];  Service: Plastics;  Laterality: Left;  ? MASTECTOMY MODIFIED RADICAL Right 05/27/2007  ? , Mastectomy modified radical (08), breast reconstruction, CA lesions excised lateral abd wall 2010  ? MASTOPEXY Left 11/21/2017  ? Procedure: LEFT BREAST REVISION MASTOPEXY FOR SYMMETRY;  Surgeon: Wallace Going, DO;  Location: [facility];  Service: Plastics;  Laterality: Left;  ? REDUCTION MAMMAPLASTY Left   ? SAVORY DILATION  01/08/2012  ? Procedure: SAVORY DILATION;  Surgeon: Gatha Mayer, MD;  Location: [facility];  Service: Endoscopy;  Laterality: N/A;  need xray  ? ? ?Family History  ?Problem Relation Age of Onset  ? Other Mother   ?     tic douloureux  ? Heart disease Father   ? Lung cancer Father   ?     smoker  ? Cirrhosis Sister   ?     Primary Biliary  ? Anxiety disorder Sister   ? Osteoporosis Sister   ? Arthritis Sister   ?     Rheumatoid  ? Osteoporosis Sister   ? Skin cancer Sister   ?     multiple skin cancers, over 37 excisions.  ? Stroke Maternal Grandmother   ? Alcohol abuse Maternal Grandfather   ? Heart disease Paternal Grandfather   ? Esophageal cancer Paternal Grandfather    ? Rectal cancer Paternal Grandfather   ? Breast cancer Maternal Aunt   ?     dx in her 70s  ? Breast cancer Maternal Aunt   ?     dx in her 53s  ? Breast cancer Maternal Aunt   ?     possible breast cancer dx and died in her 38s  ? Ovarian cancer Maternal Aunt 29  ? Pancreatic cancer Maternal Aunt   ?     dx in her 29s  ? Pancreatic cancer Maternal Uncle   ?  dx in his 52s; smoker  ? Stomach cancer Maternal Uncle   ? Cancer Maternal Uncle   ? Breast cancer Paternal Aunt   ?     dx in her 9s  ? Lung cancer Paternal Aunt   ? Breast cancer Cousin   ?     paternal first cousin  ? Breast cancer Cousin   ?     maternal first cousin  ? Anesthesia problems Neg Hx   ? Hypotension Neg Hx   ? Malignant hyperthermia Neg Hx   ? Pseudochol deficiency Neg Hx   ? ? ?Social History  ? ?Socioeconomic History  ? Marital status: Married  ?  Spouse name: Sam  ? Number of children: 3  ? Years of education: Not on file  ? Highest education level: Not on file  ?Occupational History  ? Occupation: Social worker  ?Tobacco Use  ? Smoking status: Former  ?  Packs/day: 1.00  ?  Years: 50.00  ?  Pack years: 50.00  ?  Types: Cigarettes  ?  Quit date: 05/22/2007  ?  Years since quitting: 14.8  ? Smokeless tobacco: Never  ?Vaping Use  ? Vaping Use: Never used  ?Substance and Sexual Activity  ? Alcohol use: Not Currently  ? Drug use: No  ? Sexual activity: Yes  ?  Partners: Male  ?  Comment: lives with husband,   ?Other Topics Concern  ? Not on file  ?Social History Narrative  ? Not on file  ? ?Social Determinants of Health  ? ?Financial Resource Strain: Low Risk   ? Difficulty of Paying Living Expenses: Not hard at all  ?Food Insecurity: No Food Insecurity  ? Worried About Charity fundraiser in the Last Year: Never true  ? Ran Out of Food in the Last Year: Never true  ?Transportation Needs: No Transportation Needs  ? Lack of Transportation (Medical): No  ? Lack of Transportation (Non-Medical): No  ?Physical Activity: Sufficiently Active  ? Days of  Exercise per Week: 7 days  ? Minutes of Exercise per Session: 60 min  ?Stress: No Stress Concern Present  ? Feeling of Stress : Not at all  ?Social Connections: Moderately Integrated  ? Frequency of C

## 2022-03-08 NOTE — Assessment & Plan Note (Addendum)
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 ?Home conditioning exercises given today. ?Return as needed. ?

## 2022-03-08 NOTE — Progress Notes (Signed)
? ? ?  Procedures performed today:   ? ?Procedure: Real-time Ultrasound Guided injection of the right hip joint ?Device: Samsung HS60  ?Verbal informed consent obtained.  ?Time-out conducted.  ?Noted no overlying erythema, induration, or other signs of local infection.  ?Skin prepped in a sterile fashion.  ?Local anesthesia: Topical Ethyl chloride.  ?With sterile technique and under real time ultrasound guidance: Noted arthritic joint, 1 cc kenalog 40, 2 cc lidocaine, 2 cc bupivacaine injected easily. ?Advised to call if fevers/chills, erythema, induration, drainage, or persistent bleeding.  ?Images permanently stored and available for review in PACS.  ?Impression: Technically successful ultrasound guided injection. ? ?Independent interpretation of notes and tests performed by another provider:  ? ?None. ? ?Brief History, Exam, Impression, and Recommendations:   ? 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 ?Home conditioning exercises given today. ?Return as needed. ? ?Chronic process with exacerbation and pharmacologic intervention ? ?___________________________________________ ?Gwen Her. Dianah Field, M.D., ABFM., CAQSM. ?Primary Care and Sports Medicine ?[place] ? ?Adjunct Instructor of Family Medicine  ?[employer] ?

## 2022-03-08 NOTE — Patient Instructions (Signed)
Major Depressive Disorder, Adult Major depressive disorder (MDD) is a mental health condition. It may also be called clinical depression or unipolar depression. MDD causes symptoms of sadness, hopelessness, and loss of interest in things. These symptoms last most of the day, almost every day, for 2 weeks. MDD can also cause physical symptoms. It can interfere with relationships and with everyday activities, such as work, school, and activities that are usually pleasant. MDD may be mild, moderate, or severe. It may be single-episode MDD, which happens once, or recurrent MDD, which may occur multiple times. What are the causes? The exact cause of this condition is not known. MDD is most likely caused by a combination of things, which may include: Your personality traits. Learned or conditioned behaviors or thoughts or feelings that reinforce negativity. Any alcohol or substance misuse. Long-term (chronic) physical or mental health illness. Going through a traumatic experience or major life changes. What increases the risk? The following factors may make someone more likely to develop MDD: A family history of depression. Being a woman. Troubled family relationships. Abnormally low levels of certain brain chemicals. Traumatic or painful events in childhood, especially abuse or loss of a parent. A lot of stress from life experiences, such as poor living conditions or discrimination. Chronic physical illness or other mental health disorders. What are the signs or symptoms? The main symptoms of MDD usually include: Constant depressed or irritable mood. A loss of interest in things and activities. Other symptoms include: Sleeping or eating too much or too little. Unexplained weight gain or weight loss. Tiredness or low energy. Being agitated, restless, or weak. Feeling hopeless, worthless, or guilty. Trouble thinking clearly or making decisions. Thoughts of suicide or thoughts of harming  others. Isolating oneself or avoiding other people or activities. Trouble completing tasks, work, or any normal obligations. Severe symptoms of this condition may include: Psychotic depression.This may include false beliefs, or delusions. It may also include seeing, hearing, tasting, smelling, or feeling things that are not real (hallucinations). Chronic depression or persistent depressive disorder. This is low-level depression that lasts for at least 2 years. Melancholic depression, or feeling extremely sad and hopeless. Catatonic depression, which includes trouble speaking and trouble moving. How is this diagnosed? This condition may be diagnosed based on: Your symptoms. Your medical and mental health history. You may be asked questions about your lifestyle, including any drug and alcohol use. A physical exam. Blood tests to rule out other conditions. MDD is confirmed if you have the following symptoms most of the day, nearly every day, in a 2-week period: Either a depressed mood or loss of interest. At least four other MDD symptoms. How is this treated? This condition is usually treated by mental health professionals, such as psychologists, psychiatrists, and clinical social workers. You may need more than one type of treatment. Treatment may include: Psychotherapy, also called talk therapy or counseling. Types of psychotherapy include: Cognitive behavioral therapy (CBT). This teaches you to recognize unhealthy feelings, thoughts, and behaviors, and replace them with positive thoughts and actions. Interpersonal therapy (IPT). This helps you to improve the way you communicate with others or relate to them. Family therapy. This treatment includes members of your family. Medicines to treat anxiety and depression. These medicines help to balance the brain chemicals that affect your emotions. Lifestyle changes. You may be asked to: Limit alcohol use and avoid drug use. Get regular  exercise. Get plenty of sleep. Make healthy eating choices. Spend more time outdoors. Brain stimulation. This may   be done if symptoms are very severe and other treatments have not worked. Examples of this treatment are electroconvulsive therapy and transcranial magnetic stimulation. Follow these instructions at home: Activity Exercise regularly and spend time outdoors. Find activities that you enjoy doing, and make time to do them. Find healthy ways to manage stress, such as: Meditation or deep breathing. Spending time in nature. Journaling. Return to your normal activities as told by your health care provider. Ask your health care provider what activities are safe for you. Alcohol and drug use If you drink alcohol: Limit how much you use to: 0-1 drink a day for women who are not pregnant. 0-2 drinks a day for men. Be aware of how much alcohol is in your drink. In the [place] one drink equals one 12 oz bottle of beer (355 mL), one 5 oz glass of wine (148 mL), or one 1 oz glass of hard liquor (44 mL). Discuss your alcohol use with your health care provider. Alcohol can affect any antidepressant medicines you are taking. Discuss any drug use with your health care provider. General instructions  Take over-the-counter and prescription medicines only as told by your health care provider. Eat a healthy diet and get plenty of sleep. Consider joining a support group. Your health care provider may be able to recommend one. Keep all follow-up visits as told by your health care provider. This is important. Where to find more information National Alliance on Mental Illness: www.nami.org [place] National Institute of Mental Health: www.nimh.nih.gov Contact a health care provider if: Your symptoms get worse. You develop new symptoms. Get help right away if: You self-harm. You have serious thoughts about hurting yourself or others. You hallucinate. If you ever feel like you may hurt yourself or  others, or have thoughts about taking your own life, get help right away. Go to your nearest emergency department or: Call your local emergency services (911 in the [place] Call a suicide crisis helpline, such as the National Suicide Prevention Lifeline at 1-800-273-8255 or 988 in the [place] This is open 24 hours a day in the [place] Text the Crisis Text Line at 741741 (in the [place] Summary Major depressive disorder (MDD) is a mental health condition. MDD causes symptoms of sadness, hopelessness, and loss of interest in things. These symptoms last most of the day, almost every day, for 2 weeks. The symptoms of MDD can interfere with relationships and with everyday activities. Treatments and support are available for people who develop MDD. You may need more than one type of treatment. Get help right away if you have serious thoughts about hurting yourself or others. This information is not intended to replace advice given to you by your health care provider. Make sure you discuss any questions you have with your health care provider. Document Revised: 05/24/2021 Document Reviewed: 10/10/2019 Elsevier Patient Education  2023 Elsevier Inc.  

## 2022-03-13 ENCOUNTER — Other Ambulatory Visit: Payer: Self-pay | Admitting: Family Medicine

## 2022-03-26 DIAGNOSIS — Z9049 Acquired absence of other specified parts of digestive tract: Secondary | ICD-10-CM | POA: Diagnosis not present

## 2022-03-26 DIAGNOSIS — J439 Emphysema, unspecified: Secondary | ICD-10-CM | POA: Diagnosis not present

## 2022-03-26 DIAGNOSIS — R21 Rash and other nonspecific skin eruption: Secondary | ICD-10-CM | POA: Diagnosis not present

## 2022-03-26 DIAGNOSIS — Z7982 Long term (current) use of aspirin: Secondary | ICD-10-CM | POA: Diagnosis not present

## 2022-03-26 DIAGNOSIS — R0602 Shortness of breath: Secondary | ICD-10-CM | POA: Diagnosis not present

## 2022-03-26 DIAGNOSIS — R9389 Abnormal findings on diagnostic imaging of other specified body structures: Secondary | ICD-10-CM | POA: Diagnosis not present

## 2022-03-26 DIAGNOSIS — C7889 Secondary malignant neoplasm of other digestive organs: Secondary | ICD-10-CM | POA: Diagnosis not present

## 2022-03-26 DIAGNOSIS — Z79899 Other long term (current) drug therapy: Secondary | ICD-10-CM | POA: Diagnosis not present

## 2022-03-26 DIAGNOSIS — C50911 Malignant neoplasm of unspecified site of right female breast: Secondary | ICD-10-CM | POA: Diagnosis not present

## 2022-03-26 DIAGNOSIS — G893 Neoplasm related pain (acute) (chronic): Secondary | ICD-10-CM | POA: Diagnosis not present

## 2022-03-26 DIAGNOSIS — C792 Secondary malignant neoplasm of skin: Secondary | ICD-10-CM | POA: Diagnosis not present

## 2022-03-28 ENCOUNTER — Other Ambulatory Visit: Payer: Self-pay

## 2022-03-28 ENCOUNTER — Encounter: Payer: Self-pay | Admitting: Internal Medicine

## 2022-03-28 ENCOUNTER — Telehealth: Payer: Self-pay

## 2022-03-28 MED ORDER — ATORVASTATIN CALCIUM 20 MG PO TABS: ORAL_TABLET | ORAL | 0 refills | Status: DC

## 2022-03-28 NOTE — Telephone Encounter (Signed)
**Note De-Identified  Obfuscation** Atorvastatin quantity exception done through covermymeds. ?Key: [id number] ?

## 2022-03-28 NOTE — Telephone Encounter (Signed)
**Note De-Identified  Obfuscation** Member Name: CLOVIS WARWICK ?Member ID Number: [id number] ? ?As a member of [employer] ([employer]), we are pleased to inform you that,  ?upon review of the information provided by you or your doctor, we have approved the requested  ?coverage for the following prescription drug(s): ?ATORVASTATIN CALCIUM Tablet ?Type of coverage approved: Quantity Limits ?This approval authorizes your coverage from 11/12/2021 - 11/11/2022 ? ?I have notified [id number] - [place], [place] (Ph: [phone]) of this approval and requested that they fill now and to contact the pt when ready for pick up. ?

## 2022-03-28 NOTE — Telephone Encounter (Signed)
Thanks Jeani Hawking, LPN, I really appreciate it. ?

## 2022-04-02 NOTE — Progress Notes (Unsigned)
Cardiology Office Note   Date:  04/03/2022   ID:  Sada, Mazzoni [birth year], MRN [id number]  PCP:  [name], MD  Cardiologist:   Dorris Carnes, MD   Pt presents for f/u of dizziness      History of Present Illness: Anne [name] is a 81 y.o. female with a history of orthostatic intolerance, carotid stenosis, HL, breat CA, stage IV  COPD, anemia, GERD, eosinophilic esophagitis, Echo 3/14: Mild LVH, EF 62-83%, grade 1 diastolic dysfunction. Event monitor 01/2013: NSR, extensive PVC, CV dz, palpitations.    Myoview 4/14: Low risk, no scar or ischemia, not gated.  She has been treated with low dose metoprolol for PVCs and palps.     I saw the pt in 2020   She was seen by K lawrence in 2022  The pt says she occasionally wakes up and heart is beating fast   No dizziness   When she gets up she feels fine   No significant palpitation at other times  Breathing is OK    She is tapering off of Elavil  Current Meds  Medication Sig   acetaminophen (TYLENOL) 500 MG tablet Take 500-1,000 mg by mouth every 6 (six) hours as needed (for pain.).   amitriptyline (ELAVIL) 25 MG tablet Take 4 tablets daily at bedtime.   aspirin 81 MG EC tablet Take 81 mg by mouth daily.   atorvastatin (LIPITOR) 20 MG tablet Take 1 tablet by mouth in the morning and at bedtime.   Biotin 5 MG CAPS Take by mouth.   Cholecalciferol (VITAMIN D3) 1000 UNITS CAPS Take 2,000 Units by mouth daily.   Cyanocobalamin (VITAMIN B 12 PO) Take 1 tablet by mouth daily.   diltiazem (CARDIZEM) 30 MG tablet TAKE 1 TABLET BY MOUTH DAILY FOR PALPITATIONS   everolimus (AFINITOR) 5 MG tablet Take 5 mg by mouth daily.   Ferrous Fumarate-Folic Acid 151-7 MG TABS Take 1 tablet by mouth daily.   FLUoxetine (PROZAC) 40 MG capsule Take 1 capsule (40 mg total) by mouth daily.   loratadine (CLARITIN) 10 MG tablet Take 1 tablet (10 mg total) by mouth daily.   LORazepam (ATIVAN) 1 MG tablet Take 1 tablet (1 mg total) by mouth every 8  (eight) hours as needed for anxiety.   meclizine (ANTIVERT) 12.5 MG tablet Take 1 tablet by mouth as needed.   pantoprazole (PROTONIX) 40 MG tablet Take 1 tablet (40 mg total) by mouth 2 (two) times daily before a meal.     Allergies:   Sorbitol; Tomato; Zofran; Advil [ibuprofen]; Cephalexin; Diphenhydramine hcl; Morphine and related; Antihistamines, chlorpheniramine-type; and Oxycodone   Past Medical History:  Diagnosis Date   ALKALINE PHOSPHATASE, ELEVATED 03/15/2009   Allergic state 06/10/2012   Allergy    Anemia    Anxiety and depression 04/28/2011   Arthritis    Asthma    Atypical chest pain 11/30/2011   AVM (arteriovenous malformation) of colon 2011   cecum   Baker's cyst of knee 05/22/2011   Cancer ([place]) 11/2006   XRT/chemo 01-02/ lobular invasive ca   Carotid artery disease ([place])    a. Carotid duplex 03/2014: stable 1-39% BICA, f/u due 03/2016.   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     Clotting disorder ([place])    COPD (chronic obstructive pulmonary disease) ([place])    Dementia ([facility])    Dermatitis 61/60/7371   EE (eosinophilic esophagitis)  Emphysema of lung ([place])    Esophageal ring    ESOPHAGEAL STRICTURE 03/29/2009   Fall 11/23/2012   Family history of breast cancer    Family history of colon cancer    Family history of ovarian cancer    Family history of pancreatic cancer    Folliculitis of nose 70/96/2836   GERD (gastroesophageal reflux disease) 09/29/2009   improved s/p cholecystectomy and esophagus dilatation   History of chicken pox    History of measles    History of shingles    2 episodes   Hx of echocardiogram    a. Echo 01/2013: mild LVH, EF 55-60%, normal wall motion, Gr 1 diast dysfn   Hyperlipidemia    Knee pain, bilateral 07/23/2011   Medicare annual wellness visit, subsequent 06/19/2015   Mixed hyperlipidemia 10/17/2010   Qualifier: Diagnosis of  By: Mack Guise      Orthostasis    Osteopenia 03/14/2011   Osteoporosis    Personal history of chemotherapy 2001   Personal history of radiation therapy 2001   rt breast   PERSONAL HX BREAST CANCER 09/29/2009   PVC's (premature ventricular contractions)    a. Event monitor 01/2013: NSR, extensive PVCs.   Radial neck fracture 10/2011   minimally displaced   Substance abuse ([place])    In remission 3 years.    Urinary incontinence 03/19/2012   Vaginitis 05/22/2011    Past Surgical History:  Procedure Laterality Date   APPENDECTOMY     AUGMENTATION MAMMAPLASTY Right 05/27/2007   BREAST BIOPSY Right 01/02/2007   wire loc   BREAST BIOPSY  12/26/2006   BREAST LUMPECTOMY Right 2001   BREAST RECONSTRUCTION  2008, 2009, 2010   BREAST REDUCTION WITH MASTOPEXY Left 05/30/2017   Procedure: LEFT BREAST REDUCTION FOR SYMTERY WITH MASTOPEXY;  Surgeon: Wallace Going, DO;  Location: [facility];  Service: Plastics;  Laterality: Left;   CHOLECYSTECTOMY  2010   COLONOSCOPY  09/05/10   [facility]   DENTAL SURGERY  05/2016   4 dental implants by Dr. Loyal Gambler.   ERCP  2010    CBD stone extraction    ESOPHAGOGASTRODUODENOSCOPY  01/08/2012   Procedure: ESOPHAGOGASTRODUODENOSCOPY (EGD);  Surgeon: Gatha [facility], MD;  Location: [facility];  Service: Endoscopy;  Laterality: N/A;   ESOPHAGOGASTRODUODENOSCOPY (EGD) WITH ESOPHAGEAL DILATION  2010, 2012   LAPAROSCOPIC APPENDECTOMY N/A 08/04/2016   Procedure: APPENDECTOMY LAPAROSCOPIC;  Surgeon: Michael [place], MD;  Location: [facility];  Service: General;  Laterality: N/A;   LIPOSUCTION WITH LIPOFILLING Left 11/21/2017   Procedure: LIPOSUCTION FROM ABDOMEN WITH LIPOFILLING TO LEFT BREAST;  Surgeon: Wallace Going, DO;  Location: [facility];  Service: Plastics;  Laterality: Left;   MASTECTOMY MODIFIED RADICAL Right 05/27/2007   , Mastectomy modified radical (08), breast reconstruction, CA lesions excised lateral abd wall 2010   MASTOPEXY  Left 11/21/2017   Procedure: LEFT BREAST REVISION MASTOPEXY FOR SYMMETRY;  Surgeon: Wallace Going, DO;  Location: [facility];  Service: Plastics;  Laterality: Left;   REDUCTION MAMMAPLASTY Left    SAVORY DILATION  01/08/2012   Procedure: SAVORY DILATION;  Surgeon: Gatha [facility], MD;  Location: [facility];  Service: Endoscopy;  Laterality: N/A;  need xray     Social History:  The patient  reports that she quit smoking about 14 years ago. Her smoking use included cigarettes. She has a 50.00 pack-year smoking history. She has never used smokeless tobacco. She reports that she does not currently use  alcohol. She reports that she does not use drugs.   Family History:  The patient's family history includes Alcohol abuse in her maternal grandfather; Anxiety disorder in her sister; Arthritis in her sister; Breast cancer in her cousin, cousin, maternal aunt, maternal aunt, maternal aunt, and paternal aunt; Cancer in her maternal uncle; Cirrhosis in her sister; Esophageal cancer in her paternal grandfather; Heart disease in her father and paternal grandfather; Lung cancer in her father and paternal aunt; Osteoporosis in her sister and sister; Other in her mother; Ovarian cancer (age of onset: 42) in her maternal aunt; Pancreatic cancer in her maternal aunt and maternal uncle; Rectal cancer in her paternal grandfather; Skin cancer in her sister; Stomach cancer in her maternal uncle; Stroke in her maternal grandmother.    ROS:  Please see the history of present illness. All other systems are reviewed and  Negative to the above problem except as noted.    PHYSICAL EXAM: VS:  BP 102/60   Pulse 90   Ht 5' 5.5" (1.664 m)   Wt 132 lb 9.6 oz (60.1 kg)   SpO2 94%   BMI 21.73 kg/m   GEN:  Thin 81 yo  in no acute distress  HEENT: normal  Neck: no JVD, carotid bruits Cardiac: RRR; no murmurs  No LE  edema  Respiratory:  clear to auscultation bilaterally  Good airlfow   GI: soft,  nontender, nondistended, + BS  No hepatomegaly  MS: no deformity Moving all extremities   Skin: warm and dry, no rash Neuro:  Strength and sensation are intact Psych: euthymic mood, full affect   EKG:  EKG is ordered today.  SR 90 bpm   RBBB  Nonspecific ST changes    Lipid Panel    Component Value Date/Time   CHOL 157 02/01/2022 1139   TRIG 86.0 02/01/2022 1139   HDL 74.30 02/01/2022 1139   CHOLHDL 2 02/01/2022 1139   VLDL 17.2 02/01/2022 1139   LDLCALC 66 02/01/2022 1139   LDLDIRECT 117.4 01/13/2013 1210      Wt Readings from Last 3 Encounters:  04/03/22 132 lb 9.6 oz (60.1 kg)  03/08/22 133 lb (60.3 kg)  03/01/22 132 lb 12.8 oz (60.2 kg)      ASSESSMENT AND PLAN:  1  Hx orthostasis  Pt without complaints of dizziness  Follow   Stay hydrated    2  CV dz  Mild  Keep on statin     3  HL  Continue statin    LDL 66  HDL 73  C  4  CP  Denies    5  PVCs  Having heart rate race in bed   I do not think represents PVCs   Follow   6  Hx Breast CA  Follows in oncology  7  Neuropathy   Follows in neuro    F/ U in 1 year     Current medicines are reviewed at length with the patient today.  The patient does not have concerns regarding medicines.  Signed, Dorris Carnes, MD  04/03/2022 5:52 PM    [address], [place], [place]  [address] Phone: [phone]; Fax: [phone]

## 2022-04-03 ENCOUNTER — Ambulatory Visit: Payer: Medicare HMO | Admitting: Internal Medicine

## 2022-04-03 ENCOUNTER — Encounter: Payer: Self-pay | Admitting: Internal Medicine

## 2022-04-03 VITALS — BP 102/60 | HR 90 | Ht 65.5 in | Wt 132.6 lb

## 2022-04-03 DIAGNOSIS — R002 Palpitations: Secondary | ICD-10-CM | POA: Diagnosis not present

## 2022-04-03 NOTE — Patient Instructions (Signed)
Medication Instructions:   *If you need a refill on your cardiac medications before your next appointment, please call your pharmacy*   Lab Work:  If you have labs (blood work) drawn today and your tests are completely normal, you will receive your results only by: [place] (if you have MyChart) OR A paper copy in the mail If you have any lab test that is abnormal or we need to change your treatment, we will call you to review the results.   Testing/Procedures:    Follow-Up: At [facility], you and your health needs are our priority.  As part of our continuing mission to provide you with exceptional heart care, we have created designated Provider Care Teams.  These Care Teams include your primary Cardiologist (physician) and Advanced Practice Providers (APPs -  Physician Assistants and Nurse Practitioners) who all work together to provide you with the care you need, when you need it.  We recommend signing up for the patient portal called "MyChart".  Sign up information is provided on this After Visit Summary.  MyChart is used to connect with patients for Virtual Visits (Telemedicine).  Patients are able to view lab/test results, encounter notes, upcoming appointments, etc.  Non-urgent messages can be sent to your provider as well.   To learn more about what you can do with MyChart, go to [email or web address].    Your next appointment:   12 month(s)  The format for your next appointment:   In Person  Provider:   Dorris Carnes, MD     Other Instructions   Important Information About Sugar

## 2022-04-25 ENCOUNTER — Telehealth: Payer: Self-pay | Admitting: Internal Medicine

## 2022-04-25 ENCOUNTER — Other Ambulatory Visit: Payer: Self-pay | Admitting: Internal Medicine

## 2022-04-25 DIAGNOSIS — K200 Eosinophilic esophagitis: Secondary | ICD-10-CM

## 2022-04-25 MED ORDER — LIDOCAINE VISCOUS HCL 2 % MT SOLN: 15.0000 mL | OROMUCOSAL | 1 refills | Status: DC | PRN

## 2022-04-25 MED ORDER — SUCRALFATE 1 G PO TABS: 1.0000 g | ORAL_TABLET | Freq: Three times a day (TID) | ORAL | 0 refills | Status: DC

## 2022-04-25 NOTE — Telephone Encounter (Signed)
Pt husband Sam stated that Pt oncologist recently Prescribed Afinitor 5 mg once a day. Pt started taking medication on Friday. Sam stated that after pt took medication on Tuesday about thirty minutes afterwards she started to develop difficulty breathing, swallowing pain and chest pain( burning sensation): Sam was notified for the pt hold the medication and to notify the pt oncologist as soon as possible  who prescribed the medication and notify them of the reaction that she has and see what they advise: Sam is questioning is there something that can be prescribed for the discomfort in her throat and chest ( Burning sensation);  Sam stated that pt has a hx of esophagitis and was prescribed something from a compounding pharmacy in the past that help relived the discomfort:  Please advise

## 2022-04-25 NOTE — Telephone Encounter (Signed)
Inbound call from patients husband requesting a call back. Patients husband stated that patient is taking a new medication and has a " flared up" throat. Please advise.

## 2022-04-25 NOTE — Telephone Encounter (Signed)
1) Absolutely need to notify oncologist and get advice from them - I have no experience with the medication she started.   2) She has a history of eosinophilic esophagitis and has been treated with budesnide suspension - I am not sure if that is the right thing to do now as I do not know what is causing her problems (since it started after a new medication perhaps a reaction to that)  3) We can do the following: carafate 1 g tab tidac/hs # 120 no refill + viscous xylocaine - I pended [facility] to confirm where they go  4) Let us know if this is not helping by next week

## 2022-04-25 NOTE — Telephone Encounter (Signed)
Pt Husband Sam  was made aware of Dr. [name] recommendations: Sam made aware of prescriptions: Prescriptions sent to [facility] in [place]  on [address] as sam requested: Sam  verbalized understanding with all questions answered.
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
## 2022-05-03 ENCOUNTER — Ambulatory Visit (INDEPENDENT_AMBULATORY_CARE_PROVIDER_SITE_OTHER): Payer: Medicare HMO | Admitting: Family Medicine

## 2022-05-03 ENCOUNTER — Encounter: Payer: Self-pay | Admitting: Family Medicine

## 2022-05-03 VITALS — BP 116/70 | HR 90 | Resp 20 | Ht 65.5 in | Wt 131.6 lb

## 2022-05-03 DIAGNOSIS — R413 Other amnesia: Secondary | ICD-10-CM

## 2022-05-03 DIAGNOSIS — R739 Hyperglycemia, unspecified: Secondary | ICD-10-CM

## 2022-05-03 DIAGNOSIS — Z79899 Other long term (current) drug therapy: Secondary | ICD-10-CM

## 2022-05-03 DIAGNOSIS — E782 Mixed hyperlipidemia: Secondary | ICD-10-CM

## 2022-05-03 DIAGNOSIS — C50911 Malignant neoplasm of unspecified site of right female breast: Secondary | ICD-10-CM

## 2022-05-03 DIAGNOSIS — J438 Other emphysema: Secondary | ICD-10-CM

## 2022-05-03 DIAGNOSIS — M545 Low back pain, unspecified: Secondary | ICD-10-CM | POA: Diagnosis not present

## 2022-05-03 DIAGNOSIS — F419 Anxiety disorder, unspecified: Secondary | ICD-10-CM | POA: Diagnosis not present

## 2022-05-03 DIAGNOSIS — R002 Palpitations: Secondary | ICD-10-CM | POA: Diagnosis not present

## 2022-05-03 DIAGNOSIS — F32A Depression, unspecified: Secondary | ICD-10-CM

## 2022-05-03 DIAGNOSIS — R69 Illness, unspecified: Secondary | ICD-10-CM | POA: Diagnosis not present

## 2022-05-03 DIAGNOSIS — G4700 Insomnia, unspecified: Secondary | ICD-10-CM | POA: Diagnosis not present

## 2022-05-03 DIAGNOSIS — J45909 Unspecified asthma, uncomplicated: Secondary | ICD-10-CM | POA: Diagnosis not present

## 2022-05-03 DIAGNOSIS — C792 Secondary malignant neoplasm of skin: Secondary | ICD-10-CM

## 2022-05-03 DIAGNOSIS — N6320 Unspecified lump in the left breast, unspecified quadrant: Secondary | ICD-10-CM | POA: Diagnosis not present

## 2022-05-03 NOTE — Assessment & Plan Note (Signed)
Noted for about a week. Has a h/o right breast cancer will order a left breast MGM to evaluate she sees her oncologist next month

## 2022-05-03 NOTE — Assessment & Plan Note (Signed)
Encouraged good sleep hygiene such as dark, quiet room. No blue/green glowing lights such as computer screens in bedroom. No alcohol or stimulants in evening. Cut down on caffeine as able. Regular exercise is helpful but not just prior to bed time. She sleeps well on 4 amitriptyline when she drops to 3 she does not sleep, will continue with 4 daily

## 2022-05-03 NOTE — Patient Instructions (Addendum)
Living with COPD Being diagnosed with chronic obstructive pulmonary disease (COPD) changes your life physically and emotionally. Having COPD can affect your ability to work and do things you enjoy. COPD is not the same for everyone, and it may change over time. Your health care providers can help you come up with the COPD management plan that works best for you. How to manage lifestyle changes Treatment plan Work closely with your health care providers. Follow your COPD management plan. This plan includes: Instructions about activities, exercises, diet, medicines, what to do when COPD flares up, and when to call your health care provider. A pulmonary rehabilitation program. In pulmonary rehab, you will learn about COPD, do exercises for fitness and breathing, and get support from health care providers and other people who have COPD. Managing emotions and stress Living with a chronic disease means you may also struggle with stressful emotions, such as sadness, fear, and worry. Here are some ways to manage these emotions: Talk to someone about your fear, anxiety, depression, or stress. Learn strategies to avoid or reduce stress and ask for help if you are struggling with depression or anxiety. Consider joining a COPD support group, online or in person.  Adjusting to changes COPD may limit the things you can do, but you can make certain changes to help you cope with the diagnosis. Ask for help when you need it. Getting support from friends, family, and your health care team is an important part of managing the condition. Try to get regular exercise as prescribed by a health care provider or pulmonary rehab team. Exercising can help COPD, even if you are a bit short of breath. Take steps to prevent infection and protect your lungs: Wash your hands often and avoid being in crowds. Stay away from friends and family members who are sick. Check your local air quality each day, and stay out of areas  where air pollution is likely. How to recognize changes in your condition Recognizing changes in your COPD COPD is a progressive disease. It is important to let the health care team know if your COPD is getting worse. Your treatment plan may need to change. Watch for: Increased shortness of breath, wheezing, cough, or fatigue. Loss of ability to exercise or perform daily activities, like climbing stairs. More frequent symptom flares. Signs of depression or anxiety. Recognizing stress It is normal to have additional stress when you have COPD. However, prolonged stress and anxiety can make COPD worse and lead to depression. Recognize the warning signs, which include: Feeling sad or worried more often or most of the time. Having less energy and losing interest in pleasurable activities. Changes in your appetite or sleeping patterns. Being easily angered or irritated. Having unexplained aches and pains, digestive problems, or headaches. Follow these instructions at home: Eating and drinking  Eat foods that are high in fiber, such as fresh fruits and vegetables, whole grains, and beans. Limit foods that are high in fat and processed sugars, such as fried or sweet foods. Follow a balanced diet and maintain a healthy weight. Being overweight or underweight can make COPD worse. You may work with a Microbiologist as part of your pulmonary rehab program. Drink enough fluid to keep your urine pale yellow. If you drink alcohol: Limit how much you have to: 0-1 drink a day for women who are not pregnant. 0-2 drinks a day for men. Know how much alcohol is in your drink. In the [place] one drink equals one 12 oz bottle  of beer (355 mL), one 5 oz glass of wine (148 mL), or one 1 oz glass of hard liquor (44 mL). Lifestyle If you smoke, the most important thing that you can do is to stop smoking. Continuing to smoke will cause the disease to progress faster. Do not use any products that contain nicotine or  tobacco. These products include cigarettes, chewing tobacco, and vaping devices, such as e-cigarettes. If you need help quitting, ask your health care provider. Avoid exposure to things that irritate your lungs, such as smoke, chemicals, and fumes. Activity Balance exercise and rest. Take short walks every 1-2 hours. This is important to improve blood flow and breathing. Ask for help if you feel weak or unsteady. Do exercises that include controlled breathing with body movement, such as tai chi. General instructions Take over-the-counter and prescription medicines only as told by your health care provider. Take vitamin and protein supplements as told by your health care provider or dietitian. Practice good oral hygiene and see your dental care provider regularly. An oral infection can also spread to your lungs. Make sure you receive all the vaccines that your health care provider recommends. Keep all follow-up visits. This is important. Contact a health care provider if you: Are struggling to manage your COPD. Have emotional stress that interferes with your ability to cope with COPD. Get help right away if you: Have thoughts of suicide, death, or hurting yourself or others. If you ever feel like you may hurt yourself or others, or have thoughts about taking your own life, get help right away. Go to your nearest emergency department or: Call your local emergency services (911 in the [place] Call a suicide crisis helpline, such as the [place] at (814)059-5616 or 988 in the [place]. This is open 24 hours a day in the [place] Text the Crisis Text Line at 615-640-7888 (in the [place].). Summary Being diagnosed with chronic obstructive pulmonary disease (COPD) changes your life physically and emotionally. Work with your health care providers and follow your COPD management plan. A pulmonary rehabilitation program is an important part of COPD management. Prolonged stress, anxiety, and  depression can make COPD worse. Let your health care provider know if emotional stress interferes with your ability to cope with and manage COPD. This information is not intended to replace advice given to you by your health care provider. Make sure you discuss any questions you have with your health care provider. Document Revised: 05/24/2021 Document Reviewed: 11/16/2020 Elsevier Patient Education  [place].

## 2022-05-03 NOTE — Assessment & Plan Note (Addendum)
Prozac is helping the bad dreams, anxiety and depression are better and she is not so over whelmed by stress, no changes today

## 2022-05-03 NOTE — Assessment & Plan Note (Signed)
Has progressed but she really only notes Dyspnea on the stairs. Otherwise she is

## 2022-05-14 DIAGNOSIS — C50911 Malignant neoplasm of unspecified site of right female breast: Secondary | ICD-10-CM | POA: Diagnosis not present

## 2022-05-14 DIAGNOSIS — G893 Neoplasm related pain (acute) (chronic): Secondary | ICD-10-CM | POA: Diagnosis not present

## 2022-05-14 DIAGNOSIS — N6489 Other specified disorders of breast: Secondary | ICD-10-CM | POA: Diagnosis not present

## 2022-05-17 MED ORDER — SUCRALFATE 1 GM/10ML PO SUSP: 1.0000 g | Freq: Four times a day (QID) | ORAL | 1 refills | Status: DC

## 2022-05-17 NOTE — Telephone Encounter (Signed)
Patients husband called stating that patient has not gotten any better from her that issues and is seeking advice what she should do. Please advise.

## 2022-05-17 NOTE — Telephone Encounter (Signed)
Pt husband Sam stated that pt is having severe soreness in her throat and hard for the pt to swallow the Carafate tablet that was ordered prior. Pt stated that he has been crushing the medication and giving it to her that way. Sam stated that its hard for her to swallow these days and her throat is very sore. Sam was wondering if a Carafate suspension could be ordered for the pt? Sam was notified that it would be good for the pt to be evaluated by a provider. Pt was scheduled with first available APP: Ellouise Newer PA on 05/28/2022 at 1:30 Sam made aware Pt verbalized understanding with all questions answered.  Please advise

## 2022-05-17 NOTE — Telephone Encounter (Signed)
I sent an Rx for the suspension

## 2022-05-17 NOTE — Addendum Note (Signed)
Addended by: Gatha Mayer on: 05/17/2022 05:46 PM   Modules accepted: Orders

## 2022-05-18 ENCOUNTER — Emergency Department
Admission: EM | Admit: 2022-05-18 | Discharge: 2022-05-18 | Disposition: A | Discharge status: Home / Self Care | Payer: Medicare HMO | Source: Home / Self Care | Attending: Family Medicine | Admitting: Family Medicine

## 2022-05-18 ENCOUNTER — Encounter: Payer: Self-pay | Admitting: Emergency Medicine

## 2022-05-18 DIAGNOSIS — J302 Other seasonal allergic rhinitis: Secondary | ICD-10-CM | POA: Diagnosis not present

## 2022-05-18 DIAGNOSIS — H6981 Other specified disorders of Eustachian tube, right ear: Secondary | ICD-10-CM

## 2022-05-18 DIAGNOSIS — K121 Other forms of stomatitis: Secondary | ICD-10-CM

## 2022-05-18 MED ORDER — TRIAMCINOLONE ACETONIDE 0.1 % MT PSTE: PASTE | OROMUCOSAL | 0 refills | Status: DC

## 2022-05-18 NOTE — ED Triage Notes (Signed)
Sore throat x 3 days R ear pressure  OTC - tylenol ( no relief) Oral viscous lido also not helping ulcers in mouth  Denies fever Here w/ husband

## 2022-05-18 NOTE — Telephone Encounter (Signed)
Pt husband Sam made aware of Prescription that was sent in by Dr. [name] Pt verbalized understanding with all questions answered.

## 2022-05-18 NOTE — ED Provider Notes (Signed)
[name]    CSN: [id number] Arrival date & time: 05/18/22  1557      History   Chief Complaint Chief Complaint  Patient presents with   Sore Throat    HPI Anne [name] is a 81 y.o. female.   Patient recently treated with course of chemotherapy for metastatic breast CA.  She subsequently developed mouth ulcers and sore throat.  The ulcers are now improved since she finished her chemo.  She has a history of GERD, recently prescribed Carafate pills which has been unable to take (her gastroenterologist has just sent in RX for Carafate suspension).  She reports pressure and fullness in her right ear for three days, and notes that she has been working outdoors; she has a history of allergic rhinitis for which she takes Claritin year round.  She feels well otherwise without fever, cough, myalgias, etc.  The history is provided by the patient and the spouse.    Past Medical History:  Diagnosis Date   ALKALINE PHOSPHATASE, ELEVATED 03/15/2009   Allergic state 06/10/2012   Allergy    Anemia    Anxiety and depression 04/28/2011   Arthritis    Asthma    Atypical chest pain 11/30/2011   AVM (arteriovenous malformation) of colon 2011   cecum   Baker's cyst of knee 05/22/2011   Cancer ([place]) 11/2006   XRT/chemo 01-02/ lobular invasive ca   Carotid artery disease ([place])    a. Carotid duplex 03/2014: stable 1-39% BICA, f/u due 03/2016.   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     Clotting disorder ([place])    COPD (chronic obstructive pulmonary disease) (HCC)    Dementia (HCC)    Dermatitis 11/20/3233   EE (eosinophilic esophagitis)    Emphysema of lung ([place])    Esophageal ring    ESOPHAGEAL STRICTURE 03/29/2009   Fall 11/23/2012   Family history of breast cancer    Family history of colon cancer    Family history of ovarian cancer    Family history of pancreatic cancer    Folliculitis of  nose 57/32/2025   GERD (gastroesophageal reflux disease) 09/29/2009   improved s/p cholecystectomy and esophagus dilatation   History of chicken pox    History of measles    History of shingles    2 episodes   Hx of echocardiogram    a. Echo 01/2013: mild LVH, EF 55-60%, normal wall motion, Gr 1 diast dysfn   Hyperlipidemia    Knee pain, bilateral 07/23/2011   Medicare annual wellness visit, subsequent 06/19/2015   Mixed hyperlipidemia 10/17/2010   Qualifier: Diagnosis of  By: Mack Guise     Orthostasis    Osteopenia 03/14/2011   Osteoporosis    Personal history of chemotherapy 2001   Personal history of radiation therapy 2001   rt breast   PERSONAL HX BREAST CANCER 09/29/2009   PVC's (premature ventricular contractions)    a. Event monitor 01/2013: NSR, extensive PVCs.   Radial neck fracture 10/2011   minimally displaced   Substance abuse ([place])    In remission 3 years.    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   Palpitations 04/30/2018   Skin lesion of face 02/07/2018   Acute right-sided low back pain 09/23/2017   Hypokalemia 08/11/2017   Abdominal pain 08/11/2017   SBO (small bowel obstruction) ([place]) 08/10/2017   History of right breast cancer 03/21/2017   Genetic testing 09/13/2016   Family history of breast cancer    Family history of pancreatic cancer    Family history of colon cancer    Pain in the chest 05/16/2016   Status post right breast  reconstruction 05/11/2016   Breast cancer 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 ([place]) 05/11/2016   History of breast cancer in female 05/11/2016   Osteopenia determined by x-ray 05/11/2016   IBS (irritable bowel syndrome) 03/09/2016   Dyspnea 06/19/2015   Medicare annual wellness visit, subsequent 06/19/2015   Urinary frequency 12/12/2014   Breast cancer, right breast ([place]) 10/18/2014   Superficial thrombophlebitis 03/16/2014   Plant dermatitis 03/16/2014   Chest wall pain 02/07/2014   Primary osteoarthritis of both hips 01/21/2014   Elevated sed rate 08/02/2013   Dermatitis 11/23/2012   Falls 11/23/2012   Preventative health care 10/21/2012   Allergy 06/10/2012   Urinary incontinence 03/19/2012   Anemia 12/21/2011   Knee pain, bilateral 07/23/2011   Baker's cyst of knee 49/44/9675   Eosinophilic esophagitis 91/63/8466   Anxiety and depression 04/28/2011   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   Constipation, chronic 03/15/2011   Mixed hyperlipidemia 10/17/2010   CAROTID ARTERY STENOSIS 01/13/2010   GERD 09/29/2009   PERSONAL HX BREAST CANCER 09/29/2009    Past Surgical History:  Procedure Laterality Date   APPENDECTOMY     AUGMENTATION MAMMAPLASTY Right 05/27/2007   BREAST BIOPSY Right 01/02/2007   wire loc   BREAST BIOPSY  12/26/2006   BREAST LUMPECTOMY Right 2001   BREAST RECONSTRUCTION  2008, 2009, 2010   BREAST REDUCTION WITH MASTOPEXY Left 05/30/2017   Procedure: LEFT BREAST REDUCTION FOR SYMTERY WITH MASTOPEXY;  Surgeon: Wallace Going, DO;  Location: [facility];  Service: Plastics;  Laterality: Left;   CHOLECYSTECTOMY  2010   COLONOSCOPY  09/05/10   [facility]   DENTAL SURGERY  05/2016   4 dental implants by Dr. Loyal Gambler.   ERCP  2010    CBD stone extraction    ESOPHAGOGASTRODUODENOSCOPY  01/08/2012   Procedure: ESOPHAGOGASTRODUODENOSCOPY (EGD);  Surgeon: Gatha Mayer, MD;  Location: [facility];  Service: Endoscopy;  Laterality: N/A;    ESOPHAGOGASTRODUODENOSCOPY (EGD) WITH ESOPHAGEAL DILATION  2010, 2012   LAPAROSCOPIC APPENDECTOMY N/A 08/04/2016   Procedure: APPENDECTOMY LAPAROSCOPIC;  Surgeon: Michael Boston, MD;  Location: [facility];  Service: General;  Laterality: N/A;   LIPOSUCTION WITH LIPOFILLING Left 11/21/2017   Procedure: LIPOSUCTION FROM ABDOMEN WITH LIPOFILLING TO LEFT BREAST;  Surgeon: Wallace Going, DO;  Location: [facility];  Service: Plastics;  Laterality: Left;   MASTECTOMY MODIFIED RADICAL Right 05/27/2007   , Mastectomy modified radical (08), breast reconstruction, CA lesions excised lateral abd wall 2010   MASTOPEXY Left 11/21/2017   Procedure: LEFT BREAST REVISION MASTOPEXY FOR SYMMETRY;  Surgeon: Wallace Going, DO;  Location: [facility];  Service: Plastics;  Laterality: Left;   REDUCTION MAMMAPLASTY Left    SAVORY DILATION  01/08/2012   Procedure: SAVORY DILATION;  Surgeon: Ofilia Neas  [name], MD;  Location: [facility];  Service: Endoscopy;  Laterality: N/A;  need xray    OB History   No obstetric history on file.      Home Medications    Prior to Admission medications   Medication Sig Start Date End Date Taking? Authorizing Provider  triamcinolone (KENALOG) 0.1 % paste Place thin layer on mouth ulcers four times daily 05/18/22  Yes Fallan Mccarey, Ishmael Holter, MD  acetaminophen (TYLENOL) 500 MG tablet Take 500-1,000 mg by mouth every 6 (six) hours as needed (for pain.).    [provider]  amitriptyline (ELAVIL) 25 MG tablet Take 4 tablets daily at bedtime. 03/13/22   [name], MD  aspirin 81 MG EC tablet Take 81 mg by mouth daily.    [provider]  atorvastatin (LIPITOR) 20 MG tablet Take 1 tablet by mouth in the morning and at bedtime. 03/28/22   Fay Records, MD  Biotin 5 MG CAPS Take by mouth.    [provider]  capecitabine (XELODA) 500 MG tablet Take 500 mg by mouth 2 (two) times daily. 05/14/22   [provider]   Cholecalciferol (VITAMIN D3) 1000 UNITS CAPS Take 2,000 Units by mouth daily.    [provider]  Cyanocobalamin (VITAMIN B 12 PO) Take 1 tablet by mouth daily.    [provider]  diltiazem (CARDIZEM) 30 MG tablet TAKE 1 TABLET BY MOUTH DAILY FOR PALPITATIONS 03/05/22   Fay Records, MD  everolimus (AFINITOR) 5 MG tablet Take 5 mg by mouth daily. Patient not taking: Reported on 05/18/2022 02/26/22   [provider]  exemestane (AROMASIN) 25 MG tablet Take 25 mg by mouth daily. Patient not taking: Reported on 05/18/2022 02/26/22   [provider]  Ferrous Fumarate-Folic Acid 607-3 MG TABS Take 1 tablet by mouth daily. Patient not taking: Reported on 05/18/2022 09/03/18   [name], MD  FLUoxetine (PROZAC) 40 MG capsule Take 1 capsule (40 mg total) by mouth daily. 03/08/22   Roma Schanz R, DO  lidocaine (XYLOCAINE) 2 % solution Use as directed 15 mLs in the mouth or throat as needed for mouth pain. 04/25/22   Gatha Mayer, MD  loratadine (CLARITIN) 10 MG tablet Take 1 tablet (10 mg total) by mouth daily. 04/29/18   [name], MD  LORazepam (ATIVAN) 1 MG tablet Take 1 tablet (1 mg total) by mouth every 8 (eight) hours as needed for anxiety. 12/17/21   [name], MD  meclizine (ANTIVERT) 12.5 MG tablet Take 1 tablet by mouth as needed. 11/23/20   [provider]  pantoprazole (PROTONIX) 40 MG tablet Take 1 tablet (40 mg total) by mouth 2 (two) times daily before a meal. 10/25/21   Gatha Mayer, MD  sucralfate (CARAFATE) 1 GM/10ML suspension Take 10 mLs (1 g total) by mouth 4 (four) times daily. 05/17/22   Gatha Mayer, MD    Family History Family History  Problem Relation Age of Onset   Other Mother        tic [place]   Heart disease Father    Lung cancer Father        smoker   Cirrhosis Sister        Primary Biliary   Anxiety disorder Sister    Osteoporosis Sister    Arthritis Sister        Rheumatoid   Osteoporosis  Sister    Skin cancer Sister  multiple skin cancers, over 90 excisions.   Stroke Maternal Grandmother    Alcohol abuse Maternal Grandfather    Heart disease Paternal Grandfather    Esophageal cancer Paternal Grandfather    Rectal cancer Paternal Grandfather    Breast cancer Maternal Aunt        dx in her 29s   Breast cancer Maternal Aunt        dx in her 13s   Breast cancer Maternal Aunt        possible breast cancer dx and died in her 72s   Ovarian cancer Maternal Aunt 29   Pancreatic cancer Maternal Aunt        dx in her 17s   Pancreatic cancer Maternal Uncle        dx in his 78s; smoker   Stomach cancer Maternal Uncle    Cancer Maternal Uncle    Breast cancer Paternal Aunt        dx in her 65s   Lung cancer Paternal Aunt    Breast cancer Cousin        paternal first cousin   Breast cancer Cousin        maternal first cousin   Anesthesia problems Neg Hx    Hypotension Neg Hx    Malignant hyperthermia Neg Hx    Pseudochol deficiency Neg Hx     Social History Social History   Tobacco Use   Smoking status: Former    Packs/day: 1.00    Years: 50.00    Total pack years: 50.00    Types: Cigarettes    Quit date: 05/22/2007    Years since quitting: 15.0   Smokeless tobacco: Never  Vaping Use   Vaping Use: Never used  Substance Use Topics   Alcohol use: Not Currently   Drug use: No     Allergies   Sorbitol; Tomato; Zofran; Advil [ibuprofen]; Cephalexin; Diphenhydramine hcl; Morphine and related; Antihistamines, chlorpheniramine-type; and Oxycodone   Review of Systems Review of Systems  Constitutional: Negative.   HENT:  Positive for ear pain, mouth sores and trouble swallowing. Negative for ear discharge, facial swelling, rhinorrhea, sinus pressure and sinus pain.   Eyes: Negative.   Respiratory: Negative.    Cardiovascular: Negative.   Gastrointestinal:        History of GERD.  Genitourinary: Negative.   Musculoskeletal: Negative.   Skin: Negative.    Neurological: Negative.   Hematological: Negative.      Physical Exam Triage Vital Signs ED Triage Vitals  Enc Vitals Group     BP 05/18/22 1607 105/73     Pulse Rate 05/18/22 1607 89     Resp 05/18/22 1607 16     Temp 05/18/22 1607 98.7 F (37.1 C)     Temp Source 05/18/22 1607 Oral     SpO2 05/18/22 1607 96 %     Weight 05/18/22 1613 128 lb 8 oz (58.3 kg)     Height 05/18/22 1613 5' 5.5" (1.664 m)     Head Circumference --      Peak Flow --      Pain Score 05/18/22 1612 8     Pain Loc --      Pain Edu? --      Excl. in [place]? --    No data found.  Updated Vital Signs BP 105/73 (BP Location: Left Arm)   Pulse 89   Temp 98.7 F (37.1 C) (Oral)   Resp 16   Ht 5' 5.5" (1.664 m)  Wt 58.3 kg   SpO2 96%   BMI 21.06 kg/m   Visual Acuity Right Eye Distance:   Left Eye Distance:   Bilateral Distance:    Right Eye Near:   Left Eye Near:    Bilateral Near:     Physical Exam Vitals and nursing note reviewed.  Constitutional:      General: She is not in acute distress.    Appearance: She is not ill-appearing.  HENT:     Head: Normocephalic.     Right Ear: Tympanic membrane and ear canal normal.     Left Ear: Tympanic membrane and ear canal normal.     Nose: Nose normal. No congestion.     Mouth/Throat:     Comments: Scattered erythema on buccal mucosa and mucosa of lips; no distinct ulcers.  Pharynx appears normal. Eyes:     Conjunctiva/sclera: Conjunctivae normal.     Pupils: Pupils are equal, round, and reactive to light.  Cardiovascular:     Rate and Rhythm: Normal rate.     Heart sounds: Normal heart sounds.  Pulmonary:     Breath sounds: Normal breath sounds.  Abdominal:     Palpations: Abdomen is soft.  Musculoskeletal:     Cervical back: Neck supple.     Right lower leg: No edema.     Left lower leg: No edema.  Lymphadenopathy:     Cervical: No cervical adenopathy.  Skin:    General: Skin is warm and dry.     Findings: No rash.  Neurological:      Mental Status: She is alert and oriented to person, place, and time.      UC Treatments / Results  Labs (all labs ordered are listed, but only abnormal results are displayed) Labs Reviewed -  Tympanometry:  Right ear tympanogram wide; Left ear tympanogram normal  EKG   Radiology No results found.  Procedures Procedures (including critical care time)  Medications Ordered in UC Medications - No data to display  Initial Impression / Assessment and Plan / UC Course  I have reviewed the triage vital signs and the nursing notes.  Pertinent labs & imaging results that were available during my care of the patient were reviewed by me and considered in my medical decision making (see chart for details).    Suspect mouth ulcers and sore throat secondary to recent chemotherapy. Rx for Kenalog in [place]. Note abnormal right tympanogram, although right tympanic membrane appears normal.  Suspect eurstachian tube dysfunction a result of allergic rhinitis. Followup with ENT if not improving.  Final Clinical Impressions(s) / UC Diagnoses   Final diagnoses:  Mouth ulcers  Eustachian tube dysfunction, right  Seasonal allergic rhinitis, unspecified trigger     Discharge Instructions      May use Afrin nasal spray (or generic oxymetazoline) each morning for about 5 days and then discontinue.  Also recommend using saline nasal spray several times daily and saline nasal irrigation (AYR is a common brand).  Use Flonase nasal spray each morning after using Afrin nasal spray and saline nasal irrigation. Try warm salt water gargles for sore throat.  Try alternating Claritin with Zyrtec and Allegra on a monthly basis. Begin taking recently prescribed Carafate suspension.     ED Prescriptions     Medication Sig Dispense Auth. Provider   triamcinolone (KENALOG) 0.1 % paste Place thin layer on mouth ulcers four times daily 5 g Kandra Nicolas, MD         Theone Murdoch  A,  MD 05/19/22 [phone]

## 2022-05-18 NOTE — Discharge Instructions (Addendum)
May use Afrin nasal spray (or generic oxymetazoline) each morning for about 5 days and then discontinue.  Also recommend using saline nasal spray several times daily and saline nasal irrigation (AYR is a common brand).  Use Flonase nasal spray each morning after using Afrin nasal spray and saline nasal irrigation. Try warm salt water gargles for sore throat.  Try alternating Claritin with Zyrtec and Allegra on a monthly basis. Begin taking recently prescribed Carafate suspension.

## 2022-05-21 ENCOUNTER — Other Ambulatory Visit: Payer: Self-pay | Admitting: Family Medicine

## 2022-05-21 DIAGNOSIS — N6320 Unspecified lump in the left breast, unspecified quadrant: Secondary | ICD-10-CM

## 2022-05-28 ENCOUNTER — Ambulatory Visit: Payer: Medicare HMO | Admitting: Physician Assistant

## 2022-05-31 ENCOUNTER — Other Ambulatory Visit: Payer: Self-pay | Admitting: Internal Medicine

## 2022-05-31 DIAGNOSIS — K200 Eosinophilic esophagitis: Secondary | ICD-10-CM

## 2022-06-03 ENCOUNTER — Other Ambulatory Visit: Payer: Self-pay | Admitting: Internal Medicine

## 2022-06-04 ENCOUNTER — Ambulatory Visit: Payer: Medicare HMO | Admitting: Family Medicine

## 2022-06-04 ENCOUNTER — Ambulatory Visit
Admission: RE | Admit: 2022-06-04 | Discharge: 2022-06-04 | Disposition: A | Discharge status: Home / Self Care | Payer: Medicare HMO | Source: Ambulatory Visit | Attending: Family Medicine | Admitting: Family Medicine

## 2022-06-04 ENCOUNTER — Other Ambulatory Visit: Payer: Self-pay | Admitting: Family Medicine

## 2022-06-04 DIAGNOSIS — N6320 Unspecified lump in the left breast, unspecified quadrant: Secondary | ICD-10-CM

## 2022-06-04 DIAGNOSIS — N6321 Unspecified lump in the left breast, upper outer quadrant: Secondary | ICD-10-CM | POA: Diagnosis not present

## 2022-06-04 DIAGNOSIS — N6322 Unspecified lump in the left breast, upper inner quadrant: Secondary | ICD-10-CM | POA: Diagnosis not present

## 2022-06-04 DIAGNOSIS — R922 Inconclusive mammogram: Secondary | ICD-10-CM | POA: Diagnosis not present

## 2022-06-05 ENCOUNTER — Encounter: Payer: Self-pay | Admitting: Family Medicine

## 2022-06-05 ENCOUNTER — Ambulatory Visit (INDEPENDENT_AMBULATORY_CARE_PROVIDER_SITE_OTHER): Payer: Medicare HMO | Admitting: Family Medicine

## 2022-06-05 VITALS — BP 104/69 | HR 92 | Ht 65.5 in | Wt 131.4 lb

## 2022-06-05 DIAGNOSIS — S51011A Laceration without foreign body of right elbow, initial encounter: Secondary | ICD-10-CM | POA: Diagnosis not present

## 2022-06-05 DIAGNOSIS — R21 Rash and other nonspecific skin eruption: Secondary | ICD-10-CM | POA: Diagnosis not present

## 2022-06-05 NOTE — Patient Instructions (Signed)
Leg: - uncertain etiology, but since it is responding to the steroid cream it may be a form of dermatitis, especially if you had been out in the yard recently. Recommend trying a week of the over-the-counter steroid cream (morning and evening). If not continuing to improve by that point, follow-up to reassess.  Skin tear: - Looking okay overall, no signs of infection.  - Wrap with non-adhesive dressing and keep clean. Topical antibiotic ointment is fine to use.

## 2022-06-05 NOTE — Progress Notes (Signed)
   Acute Office Visit  Subjective:     Patient ID: Anne [name], female    DOB: 12/21/1940, 81 y.o.   MRN: [id number]  CC: skin concerns   Rash This is a new problem. The current episode started in the past 7 days (3 days ago, after working in the yard). The problem has been gradually improving since onset. The affected locations include the left lower leg. The rash is characterized by redness and dryness (minimal itching). It is unknown (had been working out in the yard recently) if there was an exposure to a precipitant. Pertinent negatives include no congestion, cough, diarrhea, fatigue, fever, joint pain, shortness of breath or sore throat. Past treatments include topical steroids. The treatment provided moderate relief.   Right arm skin tear; Over the weekend she scraped her arm against the door and got a skin tear. It did bleed for awhile initially and has now bruised, but otherwise just sensitive. Skin is lying in place and they have been using antibiotic ointment and bandaids, although the adhesives have been irritating her surrounding skin. Denies any signs of infection (no warmth, spreading redness, inflammation, etc)    ROS:  A comprehensive ROS was completed and negative except as noted per HPI      Objective:    BP 104/69   Pulse 92   Ht 5' 5.5" (1.664 m)   Wt 131 lb 6.4 oz (59.6 kg)   BMI 21.53 kg/m    Physical Exam Vitals reviewed.  Constitutional:      Appearance: Normal appearance.  Musculoskeletal:     Comments: See pictures: left lower leg with erythematous, flat rash, no vesicles, drainage; right arm with ecchymosis and skin tear, no erythema, drainage, edema  Skin:    General: Skin is warm and dry.  Neurological:     General: No focal deficit present.     Mental Status: She is alert and oriented to person, place, and time. Mental status is at baseline.  Psychiatric:        Mood and Affect: Mood normal.        Behavior: Behavior normal.         Thought Content: Thought content normal.        Judgment: Judgment normal.             No results found for any visits on 06/05/22.      Assessment & Plan:   Problem List Items Addressed This Visit   None Visit Diagnoses     Rash    -  Primary - uncertain etiology, but since it is responding to the steroid cream it may be a form of dermatitis, especially if you had been out in the yard recently. Recommend trying a week of the over-the-counter steroid cream (morning and evening). If not continuing to improve by that point, follow-up to reassess.      Skin tear of right elbow without complication, initial encounter     - Looking okay overall, no signs of infection.  - Wrap with non-adhesive dressing and keep clean. Topical antibiotic ointment is fine to use.       Patient aware of signs/symptoms requiring further/urgent evaluation.    No orders of the defined types were placed in this encounter.   Return if symptoms worsen or fail to improve.  Terrilyn Saver, NP

## 2022-06-06 DIAGNOSIS — Z79899 Other long term (current) drug therapy: Secondary | ICD-10-CM | POA: Diagnosis not present

## 2022-06-06 DIAGNOSIS — R5383 Other fatigue: Secondary | ICD-10-CM | POA: Diagnosis not present

## 2022-06-06 DIAGNOSIS — C792 Secondary malignant neoplasm of skin: Secondary | ICD-10-CM | POA: Diagnosis not present

## 2022-06-06 DIAGNOSIS — C50911 Malignant neoplasm of unspecified site of right female breast: Secondary | ICD-10-CM | POA: Diagnosis not present

## 2022-06-06 DIAGNOSIS — K1379 Other lesions of oral mucosa: Secondary | ICD-10-CM | POA: Diagnosis not present

## 2022-06-11 ENCOUNTER — Ambulatory Visit
Admission: RE | Admit: 2022-06-11 | Discharge: 2022-06-11 | Disposition: A | Discharge status: Home / Self Care | Payer: Medicare HMO | Source: Ambulatory Visit | Attending: Family Medicine | Admitting: Family Medicine

## 2022-06-11 ENCOUNTER — Other Ambulatory Visit: Payer: Self-pay

## 2022-06-11 DIAGNOSIS — N6320 Unspecified lump in the left breast, unspecified quadrant: Secondary | ICD-10-CM

## 2022-06-11 DIAGNOSIS — S299XXA Unspecified injury of thorax, initial encounter: Secondary | ICD-10-CM

## 2022-06-11 DIAGNOSIS — N6325 Unspecified lump in the left breast, overlapping quadrants: Secondary | ICD-10-CM | POA: Diagnosis not present

## 2022-06-11 DIAGNOSIS — C50812 Malignant neoplasm of overlapping sites of left female breast: Secondary | ICD-10-CM | POA: Diagnosis not present

## 2022-06-11 DIAGNOSIS — N6342 Unspecified lump in left breast, subareolar: Secondary | ICD-10-CM

## 2022-06-11 MED ORDER — LORAZEPAM 1 MG PO TABS: 1.0000 mg | ORAL_TABLET | Freq: Three times a day (TID) | ORAL | 0 refills | Status: DC | PRN

## 2022-06-11 NOTE — Progress Notes (Signed)
Requesting: lorazepam '1mg'$  Contract: 02/01/22 UDS: 02/01/22 Last Visit: 06/05/22 Next Visit: 08/07/22 Last Refill: 12/17/21 #90, 1 refill  Please Advise

## 2022-06-15 ENCOUNTER — Encounter: Payer: Self-pay | Admitting: Family Medicine

## 2022-06-15 ENCOUNTER — Telehealth: Payer: Self-pay | Admitting: Family Medicine

## 2022-06-15 NOTE — Telephone Encounter (Signed)
Dr.Brown's assistant sttaed she sent a message via epic regarding this pt and just asked if Dr.[name] could respond at earliest convenience. They stated it was not urgent.

## 2022-06-18 ENCOUNTER — Other Ambulatory Visit: Payer: Self-pay | Admitting: Family Medicine

## 2022-06-18 DIAGNOSIS — C50911 Malignant neoplasm of unspecified site of right female breast: Secondary | ICD-10-CM

## 2022-07-04 DIAGNOSIS — K1231 Oral mucositis (ulcerative) due to antineoplastic therapy: Secondary | ICD-10-CM | POA: Diagnosis not present

## 2022-07-04 DIAGNOSIS — G893 Neoplasm related pain (acute) (chronic): Secondary | ICD-10-CM | POA: Diagnosis not present

## 2022-07-04 DIAGNOSIS — R21 Rash and other nonspecific skin eruption: Secondary | ICD-10-CM | POA: Diagnosis not present

## 2022-07-04 DIAGNOSIS — K1230 Oral mucositis (ulcerative), unspecified: Secondary | ICD-10-CM | POA: Diagnosis not present

## 2022-07-04 DIAGNOSIS — C50911 Malignant neoplasm of unspecified site of right female breast: Secondary | ICD-10-CM | POA: Diagnosis not present

## 2022-07-05 DIAGNOSIS — C50812 Malignant neoplasm of overlapping sites of left female breast: Secondary | ICD-10-CM | POA: Diagnosis not present

## 2022-07-05 DIAGNOSIS — Z170 Estrogen receptor positive status [ER+]: Secondary | ICD-10-CM | POA: Diagnosis not present

## 2022-07-30 ENCOUNTER — Ambulatory Visit
Admission: EM | Admit: 2022-07-30 | Discharge: 2022-07-30 | Disposition: A | Discharge status: Home / Self Care | Payer: Medicare HMO | Attending: Family Medicine | Admitting: Family Medicine

## 2022-07-30 ENCOUNTER — Ambulatory Visit (INDEPENDENT_AMBULATORY_CARE_PROVIDER_SITE_OTHER): Payer: Medicare HMO

## 2022-07-30 DIAGNOSIS — M1611 Unilateral primary osteoarthritis, right hip: Secondary | ICD-10-CM | POA: Diagnosis not present

## 2022-07-30 DIAGNOSIS — M25551 Pain in right hip: Secondary | ICD-10-CM

## 2022-07-30 NOTE — ED Triage Notes (Signed)
Pt presents with c/o rt hip pain after rollin gout of the bed this morning

## 2022-07-30 NOTE — ED Provider Notes (Signed)
[name]    CSN: [id number] Arrival date & time: 07/30/22  1440      History   Chief Complaint Chief Complaint  Patient presents with   Hip Pain    rt    HPI Anne [name] is a 81 y.o. female.   HPI Pleasant 81 year old female presents with right hip pain after rolling out of bed earlier this morning.  PMH significant for arthritis, COPD, dementia, and cancer.  Patient is accompanied by her husband this afternoon.  Past Medical History:  Diagnosis Date   ALKALINE PHOSPHATASE, ELEVATED 03/15/2009   Allergic state 06/10/2012   Allergy    Anemia    Anxiety and depression 04/28/2011   Arthritis    Asthma    Atypical chest pain 11/30/2011   AVM (arteriovenous malformation) of colon 2011   cecum   Baker's cyst of knee 05/22/2011   Cancer ([place]) 11/2006   XRT/chemo 01-02/ lobular invasive ca   Carotid artery disease ([place])    a. Carotid duplex 03/2014: stable 1-39% BICA, f/u due 03/2016.   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     Clotting disorder ([place])    COPD (chronic obstructive pulmonary disease) (HCC)    Dementia (HCC)    Dermatitis 38/25/0539   EE (eosinophilic esophagitis)    Emphysema of lung ([place])    Esophageal ring    ESOPHAGEAL STRICTURE 03/29/2009   Fall 11/23/2012   Family history of breast cancer    Family history of colon cancer    Family history of ovarian cancer    Family history of pancreatic cancer    Folliculitis of nose 76/73/4193   GERD (gastroesophageal reflux disease) 09/29/2009   improved s/p cholecystectomy and esophagus dilatation   History of chicken pox    History of measles    History of shingles    2 episodes   Hx of echocardiogram    a. Echo 01/2013: mild LVH, EF 55-60%, normal wall motion, Gr 1 diast dysfn   Hyperlipidemia    Knee pain, bilateral 07/23/2011   Medicare annual wellness visit, subsequent 06/19/2015   Mixed hyperlipidemia  10/17/2010   Qualifier: Diagnosis of  By: Mack Guise     Orthostasis    Osteopenia 03/14/2011   Osteoporosis    Personal history of chemotherapy 2001   Personal history of radiation therapy 2001   rt breast   PERSONAL HX BREAST CANCER 09/29/2009   PVC's (premature ventricular contractions)    a. Event monitor 01/2013: NSR, extensive PVCs.   Radial neck fracture 10/2011   minimally displaced   Substance abuse ([place])    In remission 3 years.    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   Palpitations 04/30/2018   Skin lesion of face 02/07/2018   Acute right-sided low back pain 09/23/2017   Hypokalemia 08/11/2017   Abdominal pain 08/11/2017   SBO (small bowel obstruction) ([place]) 08/10/2017   History of right  breast cancer 03/21/2017   Genetic testing 09/13/2016   Family history of breast cancer    Family history of pancreatic cancer    Family history of colon cancer    Pain in the chest 05/16/2016   Status post right breast reconstruction 05/11/2016   Breast cancer 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 ([place]) 05/11/2016   History of breast cancer in female 05/11/2016   Osteopenia determined by x-ray 05/11/2016   IBS (irritable bowel syndrome) 03/09/2016   Dyspnea 06/19/2015   Medicare annual wellness visit, subsequent 06/19/2015   Urinary frequency 12/12/2014   Breast cancer, right breast ([place]) 10/18/2014   Superficial thrombophlebitis 03/16/2014   Plant dermatitis  03/16/2014   Chest wall pain 02/07/2014   Primary osteoarthritis of both hips 01/21/2014   Elevated sed rate 08/02/2013   Dermatitis 11/23/2012   Falls 11/23/2012   Preventative health care 10/21/2012   Allergy 06/10/2012   Urinary incontinence 03/19/2012   Anemia 12/21/2011   Knee pain, bilateral 07/23/2011   Baker's cyst of knee 32/67/1245   Eosinophilic esophagitis 80/99/8338   Anxiety and depression 04/28/2011   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   Constipation, chronic 03/15/2011   Mixed hyperlipidemia 10/17/2010   CAROTID ARTERY STENOSIS 01/13/2010   GERD 09/29/2009   PERSONAL HX BREAST CANCER 09/29/2009    Past Surgical History:  Procedure Laterality Date   APPENDECTOMY     AUGMENTATION MAMMAPLASTY Right 05/27/2007   BREAST BIOPSY Right 01/02/2007   wire loc   BREAST BIOPSY  12/26/2006   BREAST LUMPECTOMY Right 2001   BREAST RECONSTRUCTION  2008, 2009, 2010   BREAST REDUCTION WITH MASTOPEXY Left 05/30/2017   Procedure: LEFT BREAST REDUCTION FOR SYMTERY WITH MASTOPEXY;  Surgeon: Wallace Going, DO;  Location: [facility];  Service: Plastics;  Laterality: Left;   CHOLECYSTECTOMY  2010   COLONOSCOPY  09/05/10   [facility]   DENTAL SURGERY  05/2016   4 dental implants by Dr. Loyal Gambler.   ERCP  2010    CBD stone extraction    ESOPHAGOGASTRODUODENOSCOPY  01/08/2012   Procedure: ESOPHAGOGASTRODUODENOSCOPY (EGD);  Surgeon: Gatha Mayer, MD;  Location: [facility];  Service: Endoscopy;  Laterality: N/A;   ESOPHAGOGASTRODUODENOSCOPY (EGD) WITH ESOPHAGEAL DILATION  2010, 2012   LAPAROSCOPIC APPENDECTOMY N/A 08/04/2016   Procedure: APPENDECTOMY LAPAROSCOPIC;  Surgeon:  Boston, MD;  Location: [facility];  Service: General;  Laterality: N/A;   LIPOSUCTION WITH LIPOFILLING Left 11/21/2017   Procedure: LIPOSUCTION FROM ABDOMEN WITH LIPOFILLING TO LEFT BREAST;  Surgeon: Wallace Going, DO;  Location: [facility];  Service:  Plastics;  Laterality: Left;   MASTECTOMY MODIFIED RADICAL Right 05/27/2007   , Mastectomy modified radical (08), breast reconstruction, CA lesions excised lateral abd wall 2010   MASTOPEXY Left 11/21/2017   Procedure: LEFT BREAST REVISION MASTOPEXY FOR SYMMETRY;  Surgeon: Wallace Going, DO;  Location: [facility];  Service: Plastics;  Laterality: Left;   REDUCTION MAMMAPLASTY Left    SAVORY DILATION  01/08/2012   Procedure: SAVORY DILATION;  Surgeon: Gatha Mayer, MD;  Location: [facility];  Service: Endoscopy;  Laterality: N/A;  need xray    OB History   No obstetric history on file.      Home Medications    Prior to Admission medications   Medication Sig Start Date End Date Taking? Authorizing Provider  acetaminophen (TYLENOL) 500 MG tablet Take 500-1,000 mg by mouth every 6 (six) hours as needed (for pain.).    [provider]  amitriptyline (ELAVIL) 25 MG tablet Take 4 tablets daily at bedtime. 03/13/22   [name], MD  aspirin 81 MG EC tablet Take 81 mg by mouth daily.    [provider]  atorvastatin (LIPITOR) 20 MG tablet Take 1 tablet by mouth in the morning and at bedtime. 03/28/22   Fay Records, MD  Biotin 5 MG CAPS Take by mouth.    [provider]  capecitabine (XELODA) 500 MG tablet Take 500 mg by mouth 2 (two) times daily. 05/14/22   [provider]  Cholecalciferol (VITAMIN D3) 1000 UNITS CAPS Take 2,000 Units by mouth daily.    [provider]  Cyanocobalamin (VITAMIN B 12 PO) Take 1 tablet by mouth daily.    [provider]  diltiazem (CARDIZEM) 30 MG tablet TAKE 1 TABLET BY MOUTH DAILY FOR PALPITATIONS 06/04/22   Fay Records, MD  everolimus (AFINITOR) 5 MG tablet Take 5 mg by mouth daily. Patient not taking: Reported on 05/18/2022 02/26/22   [provider]  exemestane (AROMASIN) 25 MG tablet Take 25 mg by mouth daily. Patient not taking: Reported on 05/18/2022 02/26/22   [provider]  Ferrous Fumarate-Folic Acid 960-4 MG TABS Take 1 tablet by mouth daily. Patient not taking: Reported on 05/18/2022 09/03/18   [name], MD  FLUoxetine (PROZAC) 40 MG capsule Take 1 capsule (40 mg total) by mouth daily. 03/08/22   Roma Schanz R, DO  lidocaine (XYLOCAINE) 2 % solution Use as directed 15 mLs in the mouth or throat as needed for mouth pain. 04/25/22   Gatha Mayer, MD  loratadine (CLARITIN) 10 MG tablet Take 1 tablet (10 mg total) by mouth daily. 04/29/18   [name], MD  LORazepam (ATIVAN) 1 MG tablet Take 1 tablet (1 mg total) by mouth every 8 (eight) hours as needed for anxiety. 06/11/22   Copland, Gay Filler, MD  meclizine (ANTIVERT) 12.5 MG tablet Take 1 tablet by mouth as needed. 11/23/20   [provider]  pantoprazole (PROTONIX) 40 MG tablet Take 1 tablet (40 mg total) by mouth 2 (two) times daily before a meal. 10/25/21   Gatha Mayer, MD  sucralfate (CARAFATE) 1 GM/10ML suspension Take 10 mLs (1 g total) by mouth 4 (four) times daily. 05/17/22   Gatha Mayer, MD  triamcinolone (KENALOG) 0.1 % paste Place thin layer on mouth ulcers four times daily 05/18/22   Kandra Nicolas, MD    Family History Family History  Problem Relation Age of Onset   Other Mother        tic [place]   Heart disease Father    Lung cancer Father        smoker   Cirrhosis Sister        Primary Biliary   Anxiety disorder Sister    Osteoporosis Sister    Arthritis Sister        Rheumatoid   Osteoporosis Sister    Skin cancer Sister        multiple skin cancers, over 55 excisions.   Stroke Maternal Grandmother    Alcohol abuse Maternal Grandfather    Heart disease Paternal Grandfather    Esophageal cancer Paternal Grandfather    Rectal cancer Paternal Grandfather    Breast cancer Maternal Aunt        dx in her 41s   Breast cancer Maternal Aunt        dx in her 75s   Breast cancer  Maternal Aunt        possible breast cancer dx and died in  her 3s   Ovarian cancer Maternal Aunt 29   Pancreatic cancer Maternal Aunt        dx in her 53s   Pancreatic cancer Maternal Uncle        dx in his 66s; smoker   Stomach cancer Maternal Uncle    Cancer Maternal Uncle    Breast cancer Paternal Aunt        dx in her 81s   Lung cancer Paternal Aunt    Breast cancer Cousin        paternal first cousin   Breast cancer Cousin        maternal first cousin   Anesthesia problems Neg Hx    Hypotension Neg Hx    Malignant hyperthermia Neg Hx    Pseudochol deficiency Neg Hx     Social History Social History   Tobacco Use   Smoking status: Former    Packs/day: 1.00    Years: 50.00    Total pack years: 50.00    Types: Cigarettes    Quit date: 05/22/2007    Years since quitting: 15.2   Smokeless tobacco: Never  Vaping Use   Vaping Use: Never used  Substance Use Topics   Alcohol use: Not Currently   Drug use: No     Allergies   Sorbitol; Tomato; Zofran; Advil [ibuprofen]; Cephalexin; Diphenhydramine hcl; Morphine and related; Antihistamines, chlorpheniramine-type; and Oxycodone   Review of Systems Review of Systems  Musculoskeletal:        Right hip pain since this morning  All other systems reviewed and are negative.    Physical Exam Triage Vital Signs ED Triage Vitals  Enc Vitals Group     BP 07/30/22 1454 (!) 92/59     Pulse Rate 07/30/22 1454 97     Resp 07/30/22 1454 14     Temp 07/30/22 1454 98.3 F (36.8 C)     Temp Source 07/30/22 1454 Oral     SpO2 07/30/22 1454 95 %     Weight --      Height --      Head Circumference --      Peak Flow --      Pain Score 07/30/22 1452 3     Pain Loc --      Pain Edu? --      Excl. in [place]? --    No data found.  Updated Vital Signs BP (!) 92/59 (BP Location: Left Arm)   Pulse 97   Temp 98.3 F (36.8 C) (Oral)   Resp 14   SpO2 95%    Physical Exam Vitals and nursing note reviewed.  Constitutional:      General: She is not in acute distress.    Appearance:  Normal appearance. She is normal weight. She is not ill-appearing.  HENT:     Head: Normocephalic and atraumatic.     Mouth/Throat:     Mouth: Mucous membranes are moist.     Pharynx: Oropharynx is clear.  Eyes:     Extraocular Movements: Extraocular movements intact.     Conjunctiva/sclera: Conjunctivae normal.     Pupils: Pupils are equal, round, and reactive to light.  Cardiovascular:     Rate and Rhythm: Normal rate and regular rhythm.     Pulses: Normal pulses.     Heart sounds: Normal heart sounds.  Pulmonary:     Effort: Pulmonary effort is  normal.     Breath sounds: Normal breath sounds. No wheezing, rhonchi or rales.  Musculoskeletal:        General: Normal range of motion.     Cervical back: Normal range of motion and neck supple.     Comments: Right hip (lateral inferior aspect): TTP, no deformity noted  Skin:    General: Skin is warm and dry.  Neurological:     General: No focal deficit present.     Mental Status: She is alert and oriented to person, place, and time. Mental status is at baseline.      UC Treatments / Results  Labs (all labs ordered are listed, but only abnormal results are displayed) Labs Reviewed - No data to display  EKG   Radiology DG Hip Unilat W or Wo Pelvis 2-3 Views Right  Result Date: 07/30/2022 CLINICAL DATA:  Pain right hip, recent fall EXAM: DG HIP (WITH OR WITHOUT PELVIS) 2-3V RIGHT COMPARISON:  11/21/2020 FINDINGS: No recent fracture or dislocation is seen. Marked degenerative changes are noted with large bony spurs in both hips, more so on the right side. There is interval worsening of degenerative changes. IMPRESSION: No recent fracture or dislocation is seen in right hip. Marked degenerative changes are noted with interval worsening. Electronically Signed   By: Elmer Picker M.D.   On: 07/30/2022 15:54    Procedures Procedures (including critical care time)  Medications Ordered in UC Medications - No data to  display  Initial Impression / Assessment and Plan / UC Course  I have reviewed the triage vital signs and the nursing notes.  Pertinent labs & imaging results that were available during my care of the patient were reviewed by me and considered in my medical decision making (see chart for details).     MDM: 1.  Right hip pain-right hip x-ray/pelvis revealed above, probable contusion. Advised patient of right hip/pelvis x-ray results with hard copy provided to patient.  Advised patient/husband to RICE affected area of right hip for 30 minutes 3 times daily for the next 3 days.  Advised if symptoms worsen and/or unresolved please follow-up with PCP or here for further evaluation. Final Clinical Impressions(s) / UC Diagnoses   Final diagnoses:  Right hip pain     Discharge Instructions      Advised patient of right hip/pelvis x-ray results with hard copy provided to patient.  Advised patient/husband to RICE affected area of right hip for 30 minutes 3 times daily for the next 3 days.  Advised if symptoms worsen and/or unresolved please follow-up with PCP or here for further evaluation.     ED Prescriptions   None    PDMP not reviewed this encounter.   Eliezer Lofts, [place] 07/30/22 1615

## 2022-07-30 NOTE — Discharge Instructions (Addendum)
Advised patient of right hip/pelvis x-ray results with hard copy provided to patient.  Advised patient/husband to RICE affected area of right hip for 30 minutes 3 times daily for the next 3 days.  Advised if symptoms worsen and/or unresolved please follow-up with PCP or here for further evaluation.

## 2022-08-01 DIAGNOSIS — Z23 Encounter for immunization: Secondary | ICD-10-CM | POA: Diagnosis not present

## 2022-08-01 DIAGNOSIS — Z7982 Long term (current) use of aspirin: Secondary | ICD-10-CM | POA: Diagnosis not present

## 2022-08-01 DIAGNOSIS — C792 Secondary malignant neoplasm of skin: Secondary | ICD-10-CM | POA: Diagnosis not present

## 2022-08-01 DIAGNOSIS — C50911 Malignant neoplasm of unspecified site of right female breast: Secondary | ICD-10-CM | POA: Diagnosis not present

## 2022-08-01 DIAGNOSIS — D649 Anemia, unspecified: Secondary | ICD-10-CM | POA: Diagnosis not present

## 2022-08-01 DIAGNOSIS — K1230 Oral mucositis (ulcerative), unspecified: Secondary | ICD-10-CM | POA: Diagnosis not present

## 2022-08-01 DIAGNOSIS — Z79899 Other long term (current) drug therapy: Secondary | ICD-10-CM | POA: Diagnosis not present

## 2022-08-01 DIAGNOSIS — Z9049 Acquired absence of other specified parts of digestive tract: Secondary | ICD-10-CM | POA: Diagnosis not present

## 2022-08-02 ENCOUNTER — Other Ambulatory Visit (INDEPENDENT_AMBULATORY_CARE_PROVIDER_SITE_OTHER): Payer: Medicare HMO

## 2022-08-02 DIAGNOSIS — C792 Secondary malignant neoplasm of skin: Secondary | ICD-10-CM | POA: Diagnosis not present

## 2022-08-02 DIAGNOSIS — C50911 Malignant neoplasm of unspecified site of right female breast: Secondary | ICD-10-CM | POA: Diagnosis not present

## 2022-08-03 LAB — CEA: CEA: 2.9 ng/mL — ABNORMAL HIGH

## 2022-08-06 NOTE — Assessment & Plan Note (Signed)
She is following with oncology and her CEA is up.

## 2022-08-06 NOTE — Assessment & Plan Note (Signed)
hgba1c acceptable, minimize simple carbs. Increase exercise as tolerated.  

## 2022-08-06 NOTE — Assessment & Plan Note (Signed)
No recent excerbations

## 2022-08-06 NOTE — Assessment & Plan Note (Signed)
Has been under a great deal of stress especially regarding her breast cancer

## 2022-08-07 ENCOUNTER — Ambulatory Visit (INDEPENDENT_AMBULATORY_CARE_PROVIDER_SITE_OTHER): Payer: Medicare HMO | Admitting: Family Medicine

## 2022-08-07 ENCOUNTER — Ambulatory Visit (INDEPENDENT_AMBULATORY_CARE_PROVIDER_SITE_OTHER): Payer: Medicare HMO | Admitting: Sports Medicine

## 2022-08-07 VITALS — BP 99/60 | HR 100 | Temp 98.0°F | Resp 16 | Ht 65.0 in | Wt 135.8 lb

## 2022-08-07 DIAGNOSIS — C50911 Malignant neoplasm of unspecified site of right female breast: Secondary | ICD-10-CM

## 2022-08-07 DIAGNOSIS — R739 Hyperglycemia, unspecified: Secondary | ICD-10-CM | POA: Diagnosis not present

## 2022-08-07 DIAGNOSIS — C792 Secondary malignant neoplasm of skin: Secondary | ICD-10-CM | POA: Diagnosis not present

## 2022-08-07 DIAGNOSIS — F419 Anxiety disorder, unspecified: Secondary | ICD-10-CM

## 2022-08-07 DIAGNOSIS — E782 Mixed hyperlipidemia: Secondary | ICD-10-CM

## 2022-08-07 DIAGNOSIS — F32A Depression, unspecified: Secondary | ICD-10-CM

## 2022-08-07 DIAGNOSIS — K219 Gastro-esophageal reflux disease without esophagitis: Secondary | ICD-10-CM

## 2022-08-07 DIAGNOSIS — R002 Palpitations: Secondary | ICD-10-CM | POA: Diagnosis not present

## 2022-08-07 DIAGNOSIS — M160 Bilateral primary osteoarthritis of hip: Secondary | ICD-10-CM

## 2022-08-07 DIAGNOSIS — R69 Illness, unspecified: Secondary | ICD-10-CM | POA: Diagnosis not present

## 2022-08-07 DIAGNOSIS — N6320 Unspecified lump in the left breast, unspecified quadrant: Secondary | ICD-10-CM | POA: Diagnosis not present

## 2022-08-07 MED ORDER — MELOXICAM 15 MG PO TABS: ORAL_TABLET | ORAL | 3 refills | Status: DC

## 2022-08-07 NOTE — Assessment & Plan Note (Signed)
This is a very pleasant 81 year old female, known bilateral hip osteoarthritis, x-rays show severe end-stage osteoarthritis. Last injection on the right was in April of this year. Adding meloxicam, I would like to go ahead and get her referred to Dr. Berenice Primas for discussion of arthroplasty.

## 2022-08-07 NOTE — Patient Instructions (Signed)
Check with oncology about the vaccines below  RSV (respiratory syncitial virus) vaccine, Arexvy at pharmacy Covid booster new version out late September At pharmacy High dose flu shot mid Sept to mid Oct   May have to consider a referral to ortho for consideration of hip replacement moving forward.  Keep a quad cane with you at all times.   Hip Pain The hip is the joint between the upper legs and the lower pelvis. The bones, cartilage, tendons, and muscles of your hip joint support your body and allow you to move around. Hip pain can range from a minor ache to severe pain in one or both of your hips. The pain may be felt on the inside of the hip joint near the groin, or on the outside near the buttocks and upper thigh. You may also have swelling or stiffness in your hip area. Follow these instructions at home: Managing pain, stiffness, and swelling     If directed, put ice on the painful area. To do this: Put ice in a plastic bag. Place a towel between your skin and the bag. Leave the ice on for 20 minutes, 2-3 times a day. If directed, apply heat to the affected area as often as told by your health care provider. Use the heat source that your health care provider recommends, such as a moist heat pack or a heating pad. Place a towel between your skin and the heat source. Leave the heat on for 20-30 minutes. Remove the heat if your skin turns bright red. This is especially important if you are unable to feel pain, heat, or cold. You may have a greater risk of getting burned. Activity Do exercises as told by your health care provider. Avoid activities that cause pain. General instructions  Take over-the-counter and prescription medicines only as told by your health care provider. Keep a journal of your symptoms. Write down: How often you have hip pain. The location of your pain. What the pain feels like. What makes the pain worse. Sleep with a pillow between your legs on your  most comfortable side. Keep all follow-up visits as told by your health care provider. This is important. Contact a health care provider if: You cannot put weight on your leg. Your pain or swelling continues or gets worse after one week. It gets harder to walk. You have a fever. Get help right away if: You fall. You have a sudden increase in pain and swelling in your hip. Your hip is red or swollen or very tender to touch. Summary Hip pain can range from a minor ache to severe pain in one or both of your hips. The pain may be felt on the inside of the hip joint near the groin, or on the outside near the buttocks and upper thigh. Avoid activities that cause pain. Write down how often you have hip pain, the location of the pain, what makes it worse, and what it feels like. This information is not intended to replace advice given to you by your health care provider. Make sure you discuss any questions you have with your health care provider. Document Revised: 03/16/2019 Document Reviewed: 03/16/2019 Elsevier Patient Education  [place].
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
## 2022-08-07 NOTE — Progress Notes (Signed)
    Procedures performed today:    None.  Independent interpretation of notes and tests performed by another provider:   None.  Brief History, Exam, Impression, and Recommendations:    Primary osteoarthritis of both hips This is a very pleasant 81 year old female, known bilateral hip osteoarthritis, x-rays show severe end-stage osteoarthritis. Last injection on the right was in April of this year. Adding meloxicam, I would like to go ahead and get her referred to Dr. Berenice Primas for discussion of arthroplasty.  Chronic process with exacerbation and pharmacologic intervention  ____________________________________________ Gwen Her. Dianah Field, M.D., ABFM., CAQSM., AME. Primary Care and Sports Medicine [facility]  Adjunct Professor of [place] [employer] [facility] of Medicine  Risk manager

## 2022-08-08 ENCOUNTER — Encounter: Payer: Self-pay | Admitting: Sports Medicine

## 2022-08-08 ENCOUNTER — Encounter: Payer: Self-pay | Admitting: Family Medicine

## 2022-08-08 NOTE — Assessment & Plan Note (Signed)
While on oral iron she was experiencing esophageal spasm and pain but since stopping she is better. If her anemia worsens she may have to consider iron infusions and will consider referral to [facility] to manage this

## 2022-08-08 NOTE — Assessment & Plan Note (Signed)
With new course of chemotherapy her lesion has essentially resolved and her CEA has trended down nicely

## 2022-08-09 ENCOUNTER — Other Ambulatory Visit: Payer: Self-pay | Admitting: Family Medicine

## 2022-08-09 DIAGNOSIS — S299XXA Unspecified injury of thorax, initial encounter: Secondary | ICD-10-CM

## 2022-08-09 NOTE — Telephone Encounter (Signed)
For some reason this was the only lab that was collected.

## 2022-08-09 NOTE — Telephone Encounter (Signed)
Requesting: ativan Contract: n/a UDS: n/a Last Visit:08/07/22 Next Visit:03/05/22 Last Refill:06/11/22  Please Advise

## 2022-08-12 ENCOUNTER — Other Ambulatory Visit: Payer: Self-pay | Admitting: Adult Health

## 2022-08-28 DIAGNOSIS — M1711 Unilateral primary osteoarthritis, right knee: Secondary | ICD-10-CM | POA: Diagnosis not present

## 2022-08-28 DIAGNOSIS — M1611 Unilateral primary osteoarthritis, right hip: Secondary | ICD-10-CM | POA: Diagnosis not present

## 2022-08-29 ENCOUNTER — Other Ambulatory Visit (INDEPENDENT_AMBULATORY_CARE_PROVIDER_SITE_OTHER): Payer: Medicare HMO

## 2022-08-29 DIAGNOSIS — R739 Hyperglycemia, unspecified: Secondary | ICD-10-CM | POA: Diagnosis not present

## 2022-08-29 DIAGNOSIS — C50911 Malignant neoplasm of unspecified site of right female breast: Secondary | ICD-10-CM | POA: Diagnosis not present

## 2022-08-29 DIAGNOSIS — E782 Mixed hyperlipidemia: Secondary | ICD-10-CM | POA: Diagnosis not present

## 2022-08-29 DIAGNOSIS — R002 Palpitations: Secondary | ICD-10-CM

## 2022-08-29 DIAGNOSIS — C792 Secondary malignant neoplasm of skin: Secondary | ICD-10-CM

## 2022-08-29 NOTE — Addendum Note (Signed)
Addended by: Kelle Darting A on: 08/29/2022 02:40 PM   Modules accepted: Orders

## 2022-08-30 ENCOUNTER — Telehealth: Payer: Self-pay | Admitting: Internal Medicine

## 2022-08-30 ENCOUNTER — Telehealth: Payer: Self-pay

## 2022-08-30 ENCOUNTER — Telehealth: Payer: Self-pay | Admitting: *Deleted

## 2022-08-30 LAB — COMPREHENSIVE METABOLIC PANEL
ALT: 18 U/L (ref 0–35)
AST: 18 U/L (ref 0–37)
Albumin: 4.6 g/dL (ref 3.5–5.2)
Alkaline Phosphatase: 76 U/L (ref 39–117)
BUN: 28 mg/dL — ABNORMAL HIGH (ref 6–23)
CO2: 22 mEq/L (ref 19–32)
Calcium: 9.9 mg/dL (ref 8.4–10.5)
Chloride: 104 mEq/L (ref 96–112)
Creatinine, Ser: 0.77 mg/dL (ref 0.40–1.20)
GFR: 73.08 mL/min (ref >60.00)
Glucose, Bld: 115 mg/dL — ABNORMAL HIGH (ref 70–99)
Potassium: 3.8 mEq/L (ref 3.5–5.1)
Sodium: 138 mEq/L (ref 135–145)
Total Bilirubin: 0.4 mg/dL (ref 0.2–1.2)
Total Protein: 7.2 g/dL (ref 6.0–8.3)

## 2022-08-30 LAB — LIPID PANEL
Cholesterol: 169 mg/dL (ref 0–200)
HDL: 73.4 mg/dL (ref >39.00)
LDL Cholesterol: 72 mg/dL (ref 0–99)
NonHDL: 95.96
Total CHOL/HDL Ratio: 2
Triglycerides: 122 mg/dL (ref 0.0–149.0)
VLDL: 24.4 mg/dL (ref 0.0–40.0)

## 2022-08-30 LAB — CBC
HCT: 33.6 % — ABNORMAL LOW (ref 36.0–46.0)
Hemoglobin: 11 g/dL — ABNORMAL LOW (ref 12.0–15.0)
MCHC: 32.6 g/dL (ref 30.0–36.0)
MCV: 98.1 fl (ref 78.0–100.0)
Platelets: 338 10*3/uL (ref 150.0–400.0)
RBC: 3.42 Mil/uL — ABNORMAL LOW (ref 3.87–5.11)
RDW: 20.4 % — ABNORMAL HIGH (ref 11.5–15.5)
WBC: 19.1 10*3/uL (ref 4.0–10.5)

## 2022-08-30 LAB — HEMOGLOBIN A1C: Hgb A1c MFr Bld: 6 % (ref 4.6–6.5)

## 2022-08-30 LAB — TSH: TSH: 0.71 u[IU]/mL (ref 0.35–5.50)

## 2022-08-30 NOTE — Telephone Encounter (Signed)
   Name: Anne [name]  DOB: 09-11-1941  MRN: [id number]  Primary Cardiologist: Dorris Carnes, MD   Preoperative team, please contact this patient and set up a phone call appointment for further preoperative risk assessment. Please obtain consent and complete medication review. Thank you for your help.  I confirm that guidance regarding antiplatelet and oral anticoagulation therapy has been completed and, if necessary, noted below.  Patient aspirin as prescribed by a non cardiology provider.  Recommendations for holding aspirin will need to come from prescribing provider.   Deberah Pelton, NP 08/30/2022, 11:22 AM [place]

## 2022-08-30 NOTE — Telephone Encounter (Signed)
1st attempt to reach pt regarding surgical clearance and the need for a tele visit.  Left a message to call back and ask for the preop team. 

## 2022-08-30 NOTE — Telephone Encounter (Signed)
Left message on machine to call back  

## 2022-08-30 NOTE — Telephone Encounter (Signed)
Left message for pt to return my call regarding below result.  Specimen no longer able to have additional tests added. Pt will need to return for lab appt to complete both tests. Also see below questions re: pt status.

## 2022-08-30 NOTE — Telephone Encounter (Signed)
CRITICAL VALUE STICKER  CRITICAL VALUE: WBC 19.1  RECEIVER (on-site recipient of call): Manuela Schwartz, RMA  DATE & TIME NOTIFIED: 08/30/2022 @ 10:33 am  MESSENGER (representative from lab): Margarita Grizzle  MD NOTIFIED: Penni Homans, MD   TIME OF NOTIFICATION:10:35 am  RESPONSE:

## 2022-08-30 NOTE — Telephone Encounter (Signed)
   Pre-operative Risk Assessment    Patient Name: Anne [name]  DOB: 12-Jun-1941 MRN: [id number]      Request for Surgical Clearance    Procedure:   Right Total hip OIrthoplasty  Date of Surgery:  TBDce                                  Surgeon:   Dr Dorna Leitz Surgeon's Group or Practice Name:   Phone number:  [phone] Fax number:  [phone]   Type of Clearance Requested:   mrdical and Medicine- not sure what medicine    Type of Anesthesia:  Spinal   Additional requests/questions:    Signed, Glyn Ade   08/30/2022, 10:56 AM

## 2022-08-31 ENCOUNTER — Other Ambulatory Visit: Payer: Self-pay

## 2022-08-31 DIAGNOSIS — D72829 Elevated white blood cell count, unspecified: Secondary | ICD-10-CM

## 2022-08-31 NOTE — Telephone Encounter (Signed)
2nd attempt; Left message to call back for tele pre op appt.

## 2022-08-31 NOTE — Telephone Encounter (Signed)
Called pt lab appt made lab orders are in  Pt states  no acute illness and will stay with [facility]. Pt stated this happen before but she said Did have steroid injection with Orthopedic Dr. Dawayne Cirri.

## 2022-09-03 ENCOUNTER — Other Ambulatory Visit (INDEPENDENT_AMBULATORY_CARE_PROVIDER_SITE_OTHER): Payer: Medicare HMO

## 2022-09-03 DIAGNOSIS — D72829 Elevated white blood cell count, unspecified: Secondary | ICD-10-CM

## 2022-09-03 LAB — CBC WITH DIFFERENTIAL/PLATELET
Absolute Monocytes: 816 cells/uL (ref 200–950)
Basophils Absolute: 38 cells/uL (ref 0–200)
Basophils Relative: 0.4 %
Eosinophils Absolute: 202 cells/uL (ref 15–500)
Eosinophils Relative: 2.1 %
HCT: 37.9 % (ref 35.0–45.0)
Hemoglobin: 12.5 g/dL (ref 11.7–15.5)
Lymphs Abs: 2054 cells/uL (ref 850–3900)
MCH: 32.3 pg (ref 27.0–33.0)
MCHC: 33 g/dL (ref 32.0–36.0)
MCV: 97.9 fL (ref 80.0–100.0)
MPV: 9.5 fL (ref 7.5–12.5)
Monocytes Relative: 8.5 %
Neutro Abs: 6490 cells/uL (ref 1500–7800)
Neutrophils Relative %: 67.6 %
Platelets: 293 10*3/uL (ref 140–400)
RBC: 3.87 10*6/uL (ref 3.80–5.10)
RDW: 17.9 % — ABNORMAL HIGH (ref 11.0–15.0)
Total Lymphocyte: 21.4 %
WBC: 9.6 10*3/uL (ref 3.8–10.8)

## 2022-09-03 NOTE — Telephone Encounter (Signed)
Our office has attempted x 3 to reach the pt for a tele pre op appt. I will send FYI to requesting office the pt needs a tele pre op appt but has not returned our calls.

## 2022-09-04 ENCOUNTER — Ambulatory Visit (INDEPENDENT_AMBULATORY_CARE_PROVIDER_SITE_OTHER): Payer: Medicare HMO

## 2022-09-04 ENCOUNTER — Ambulatory Visit (INDEPENDENT_AMBULATORY_CARE_PROVIDER_SITE_OTHER): Payer: Medicare HMO | Admitting: Sports Medicine

## 2022-09-04 ENCOUNTER — Telehealth: Payer: Self-pay | Admitting: Sports Medicine

## 2022-09-04 DIAGNOSIS — M1711 Unilateral primary osteoarthritis, right knee: Secondary | ICD-10-CM | POA: Diagnosis not present

## 2022-09-04 DIAGNOSIS — M545 Low back pain, unspecified: Secondary | ICD-10-CM | POA: Diagnosis not present

## 2022-09-04 DIAGNOSIS — M461 Sacroiliitis, not elsewhere classified: Secondary | ICD-10-CM | POA: Diagnosis not present

## 2022-09-04 DIAGNOSIS — M160 Bilateral primary osteoarthritis of hip: Secondary | ICD-10-CM

## 2022-09-04 LAB — PATHOLOGIST SMEAR REVIEW

## 2022-09-04 NOTE — Progress Notes (Signed)
    Procedures performed today:    Procedure: Real-time Ultrasound Guided injection of the right sacroiliac joint Device: Samsung HS60  Verbal informed consent obtained.  Time-out conducted.  Noted no overlying erythema, induration, or other signs of local infection.  Skin prepped in a sterile fashion.  Local anesthesia: Topical Ethyl chloride.  With sterile technique and under real time ultrasound guidance: Noted arthritic joint, 1 cc Kenalog 40, 2 cc lidocaine, 2 cc bupivacaine injected easily Completed without difficulty  Advised to call if fevers/chills, erythema, induration, drainage, or persistent bleeding.  Images permanently stored and available for review in PACS.  Impression: Technically successful ultrasound guided injection.  Independent interpretation of notes and tests performed by another provider:   None.  Brief History, Exam, Impression, and Recommendations:    Acute right-sided low back pain [place] returns, she is a very pleasant 81 year old female with multilevel lumbar spondylitic changes including spinal stenosis at L4-L5, she also has degenerative changes in her sacroiliac joint, a right-sided sacroiliac joint back in May 2022 provide fantastic relief until recently. Now with recurrence of pain localized to the SI joint, I repeated her SI joint injection today, return as needed.  Primary osteoarthritis of both hips Currently managed with Dr. Berenice Primas, arthroplasty coming up.  Primary osteoarthritis of right knee X-ray confirmed osteoarthritis, recently injected by Dr. Berenice Primas without much improvement. Tenderness at the medial joint line. Oral analgesics ineffective, over 6 weeks of conservative treatment ineffective, proceeding with approval for viscosupplementation.  Chronic process with exacerbation and pharmacologic intervention  ____________________________________________ Gwen Her. Dianah Field, M.D., ABFM., CAQSM., AME. Primary Care and Sports  Medicine [facility]  Adjunct Professor of [place] [employer] [facility] of Medicine  Risk manager

## 2022-09-04 NOTE — Telephone Encounter (Signed)
Visco approval please, x-ray confirmed osteoarthritis, failed conservative treatment for over 3 months, including physician directed home therapy, has failed steroid injection, analgesics, Visco approval right knee only.

## 2022-09-04 NOTE — Telephone Encounter (Signed)
Pt returned on 10/23 for repeat lab.

## 2022-09-04 NOTE — Assessment & Plan Note (Signed)
[place] returns, she is a very pleasant 81 year old female with multilevel lumbar spondylitic changes including spinal stenosis at L4-L5, she also has degenerative changes in her sacroiliac joint, a right-sided sacroiliac joint back in May 2022 provide fantastic relief until recently. Now with recurrence of pain localized to the SI joint, I repeated her SI joint injection today, return as needed.

## 2022-09-04 NOTE — Assessment & Plan Note (Signed)
X-ray confirmed osteoarthritis, recently injected by Dr. Berenice Primas without much improvement. Tenderness at the medial joint line. Oral analgesics ineffective, over 6 weeks of conservative treatment ineffective, proceeding with approval for viscosupplementation.

## 2022-09-04 NOTE — Assessment & Plan Note (Signed)
Currently managed with Dr. Berenice Primas, arthroplasty coming up.

## 2022-09-05 NOTE — Telephone Encounter (Signed)
My[employer] paperwork faxed to [employer] at [phone] Request is for Orthovisc Pt's insurance prefers Orthovisc Fax confirmation receipt received  

## 2022-09-12 DIAGNOSIS — C50911 Malignant neoplasm of unspecified site of right female breast: Secondary | ICD-10-CM | POA: Diagnosis not present

## 2022-09-12 DIAGNOSIS — N6489 Other specified disorders of breast: Secondary | ICD-10-CM | POA: Diagnosis not present

## 2022-09-12 DIAGNOSIS — K219 Gastro-esophageal reflux disease without esophagitis: Secondary | ICD-10-CM | POA: Diagnosis not present

## 2022-09-12 DIAGNOSIS — M25551 Pain in right hip: Secondary | ICD-10-CM | POA: Diagnosis not present

## 2022-09-12 DIAGNOSIS — C792 Secondary malignant neoplasm of skin: Secondary | ICD-10-CM | POA: Diagnosis not present

## 2022-09-12 DIAGNOSIS — G893 Neoplasm related pain (acute) (chronic): Secondary | ICD-10-CM | POA: Diagnosis not present

## 2022-09-12 NOTE — Telephone Encounter (Signed)
Benefits Investigation Details received from [employer] Injection: Orthovisc  Medical: Deductible does not apply. Once the OOP has been met, pt is covered at 100%/ Only one copay per date of service.  PA required: Yes PA form and medical records faxed to [facility] at [phone]  Fax confirmation received  Pharmacy: Product not covered under the pharmacy plan.   Specialty Pharmacy: [facility]  May fill through: Buy and [place] Copay/Coinsurance:  Product Copay: 20% ($140) Administration Coinsurance:  Administration Copay: $25 Deductible:   Out of Pocket Max: $4500 (met: $1791.83)    Auth submitted to [employer]; awaiting response.

## 2022-09-19 NOTE — Telephone Encounter (Signed)
PA determination received from [facility]) PA has been approved for Orthovisc 09/12/22 THRU 12/13/22 Pt aware of approval and the approx cost. She does not wish to proceed at this time.  She is aware Auth good through February 2024.

## 2022-10-02 ENCOUNTER — Encounter: Payer: Self-pay | Admitting: Family Medicine

## 2022-10-08 ENCOUNTER — Ambulatory Visit: Payer: Medicare HMO | Admitting: Sports Medicine

## 2022-10-12 ENCOUNTER — Encounter: Payer: Self-pay | Admitting: Internal Medicine

## 2022-10-14 ENCOUNTER — Encounter: Payer: Self-pay | Admitting: Sports Medicine

## 2022-10-16 ENCOUNTER — Ambulatory Visit (INDEPENDENT_AMBULATORY_CARE_PROVIDER_SITE_OTHER): Payer: Medicare HMO | Admitting: Sports Medicine

## 2022-10-16 ENCOUNTER — Ambulatory Visit (INDEPENDENT_AMBULATORY_CARE_PROVIDER_SITE_OTHER): Payer: Medicare HMO

## 2022-10-16 DIAGNOSIS — M545 Low back pain, unspecified: Secondary | ICD-10-CM

## 2022-10-16 DIAGNOSIS — M1711 Unilateral primary osteoarthritis, right knee: Secondary | ICD-10-CM

## 2022-10-16 DIAGNOSIS — M797 Fibromyalgia: Secondary | ICD-10-CM | POA: Diagnosis not present

## 2022-10-16 MED ORDER — HYALURONAN 30 MG/2ML IX SOSY
30.0000 mg | PREFILLED_SYRINGE | Freq: Once | INTRA_ARTICULAR | Status: AC
  Administered 2022-10-16: 30 mg via INTRA_ARTICULAR

## 2022-10-16 MED ORDER — TRAMADOL HCL 50 MG PO TABS: 50.0000 mg | ORAL_TABLET | Freq: Three times a day (TID) | ORAL | 3 refills | Status: DC | PRN

## 2022-10-16 NOTE — Assessment & Plan Note (Signed)
Orthovisc No. 1 of 4 right knee, return in 1 week for #2 of 4.

## 2022-10-16 NOTE — Progress Notes (Signed)
    Procedures performed today:    Procedure: Real-time Ultrasound Guided injection of the right knee Device: Samsung HS60  Verbal informed consent obtained.  Time-out conducted.  Noted no overlying erythema, induration, or other signs of local infection.  Skin prepped in a sterile fashion.  Local anesthesia: Topical Ethyl chloride.  With sterile technique and under real time ultrasound guidance: Trace effusion, 30 mg/2 mL of OrthoVisc (sodium hyaluronate) in a prefilled syringe was injected easily into the knee through a 22-gauge needle. Completed without difficulty  Advised to call if fevers/chills, erythema, induration, drainage, or persistent bleeding.  Images permanently stored and available for review in PACS.  Impression: Technically successful ultrasound guided injection.  Independent interpretation of notes and tests performed by another provider:   None.  Brief History, Exam, Impression, and Recommendations:    Primary osteoarthritis of right knee Orthovisc No. 1 of 4 right knee, return in 1 week for #2 of 4.  Acute right-sided low back pain Persistent axial right-sided low back pain, historically she has done well with SI joint injections, unfortunately she did not get relief after the most recent injection, pain is posterior buttock with radiation down the anterolateral and posterior thigh. She also has multilevel lumbar spondylitic change with spinal stenosis at L4-L5 which is another potential pain generator and I think this is the most pertinent pain generator now. She was asking for an injection in her buttock however I do not think we should just go injecting blindly. Ordering an MRI. Tramadol for pain relief in the meantime. Follow-up for results and injection planning.  Fibromyalgia [place] also has significant allodynia and hyperalgesia to light palpation raising the likelihood of fibromyalgia. I did inform her that if we are unable to get control of her  symptoms with all of the above treatment we would likely switch to neuropathic agents such as Lyrica and gabapentin.  I spent 40 minutes of total time managing this patient today, this includes chart review, face to face, and non-face to face time.  This was separate from time spent performing the above procedure.  ____________________________________________ Gwen Her. Dianah Field, M.D., ABFM., CAQSM., AME. Primary Care and Sports Medicine [facility]  Adjunct Professor of [place] [employer] [facility] [employer]  Risk manager

## 2022-10-16 NOTE — Assessment & Plan Note (Signed)
Persistent axial right-sided low back pain, historically she has done well with SI joint injections, unfortunately she did not get relief after the most recent injection, pain is posterior buttock with radiation down the anterolateral and posterior thigh. She also has multilevel lumbar spondylitic change with spinal stenosis at L4-L5 which is another potential pain generator and I think this is the most pertinent pain generator now. She was asking for an injection in her buttock however I do not think we should just go injecting blindly. Ordering an MRI. Tramadol for pain relief in the meantime. Follow-up for results and injection planning.

## 2022-10-16 NOTE — Assessment & Plan Note (Signed)
[place] also has significant allodynia and hyperalgesia to light palpation raising the likelihood of fibromyalgia. I did inform her that if we are unable to get control of her symptoms with all of the above treatment we would likely switch to neuropathic agents such as Lyrica and gabapentin.

## 2022-10-17 ENCOUNTER — Telehealth: Payer: Self-pay | Admitting: *Deleted

## 2022-10-17 NOTE — Telephone Encounter (Signed)
Per the pre op provider to schedule an appt in the office for ore op  clearance. See my chart messages. The clearance was entered back on 08/30/22 for the hip surgery with DR. Graves, though we had attempted to reach the pt to make appt with no success back then. Pt has not sent message through my chart asking for appt.   I have left a message for the pt to call the office to schedule in office with Dr. Harrington Challenger or APP for pre op clearance.

## 2022-10-18 NOTE — Telephone Encounter (Signed)
Called and left a voice message stating that I was calling to get her an appointment scheduled for a pre op clearance

## 2022-10-19 NOTE — Telephone Encounter (Signed)
Patient returned call she has appt schedule for 12/11 at 2:45pm for her clearance.

## 2022-10-20 ENCOUNTER — Encounter: Payer: Self-pay | Admitting: Sports Medicine

## 2022-10-20 DIAGNOSIS — M545 Low back pain, unspecified: Secondary | ICD-10-CM

## 2022-10-21 NOTE — Progress Notes (Signed)
Cardiology Office Note:    Date:  10/22/2022   ID:  Anne, [name] [birth year], MRN [id number]  PCP:  Bradd Canary, MD   [facility] Providers Cardiologist:  Dietrich Pates, MD     Referring MD: Bradd Canary, MD   Chief Complaint: preoperative cardiac evaluation  History of Present Illness:    Anne [name] is a very pleasant 81 y.o. female with a hx of orthostatic intolerance, carotid stenosis, breast CA stage IV, COPD, HLD, anemia, GERD, eosinophilic esophagitis  In March 2014, echo revealed mild LVH, EF 55 to 60%, grade 1 diastolic dysfunction.  Event monitor 01/2013 revealed NSR, extensive PVC, palpitations.  Myoview low risk, no scar or ischemia, not gated.  She had been treated with low-dose metoprolol for PVC and palp's.  Seen by Dr. Tenny Craw 09/25/2019 at which time she was asymptomatic concerning cardiac symptoms.  Seen by Joni Reining, NP on 01/27/2021. Reported history of stage IV breast cancer being followed at [facility]. She had undergone lumpectomies with radiation and chemotherapy in 2008. Had reconstruction surgery.  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 No specific cardiac concerns at the time with recommendation to return in 6 months for follow-up.  Seen by Dr. Tenny Craw on 04/03/22 at which time she reported occasionally waking up with heart beating faster.  When she gets up she feels fine, no significant palpitations at other times, no dizziness.  Was tapering off Elavil.  No specific cardiac concerns. Was advised to return in 1 year for follow-up.  Today, she is here with her husband for preoperative cardiac evaluation for right total hip arthroplasty. Reports she has been doing well but has been limited by pain in right leg and muscle spasms that sometimes cause her to have to stop  walking. Walks slowly with a 3 prong walker, is able to walk a long distance at a slow pace. Remains active doing yard work but must sit while working due to hip pain.  No chest pain, shortness of breath, palpitations with activity.  No presyncope, syncope, orthopnea, PND, or bleeding concerns. No further episodes of palpitations since last office visit.   Past Medical History:  Diagnosis Date   ALKALINE PHOSPHATASE, ELEVATED 03/15/2009   Allergic state 06/10/2012   Allergy    Anemia    Anxiety and depression 04/28/2011   Arthritis    Asthma    Atypical chest pain 11/30/2011   AVM (arteriovenous malformation) of colon 2011   cecum   Baker's cyst of knee 05/22/2011   Cancer (HCC) 11/2006   XRT/chemo 01-02/ lobular invasive ca   Carotid artery disease (HCC)    a. Carotid duplex 03/2014: stable 1-39% BICA, f/u due 03/2016.   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     Clotting disorder (HCC)    COPD (chronic obstructive pulmonary disease) (HCC)    Dementia (HCC)    Dermatitis 11/23/2012   EE (eosinophilic esophagitis)    Emphysema of lung (HCC)    Esophageal ring    ESOPHAGEAL STRICTURE 03/29/2009   Fall 11/23/2012   Family history of breast cancer    Family history of colon cancer    Family history of ovarian cancer    Family history of pancreatic cancer  Folliculitis of nose 12/31/2011   GERD (gastroesophageal reflux disease) 09/29/2009   improved s/p cholecystectomy and esophagus dilatation   History of chicken pox    History of measles    History of shingles    2 episodes   Hx of echocardiogram    a. Echo 01/2013: mild LVH, EF 55-60%, normal wall motion, Gr 1 diast dysfn   Hyperlipidemia    Knee pain, bilateral 07/23/2011   Medicare annual wellness visit, subsequent 06/19/2015   Mixed hyperlipidemia 10/17/2010   Qualifier: Diagnosis of  By: Omar Person     Orthostasis    Osteopenia  03/14/2011   Osteoporosis    Personal history of chemotherapy 2001   Personal history of radiation therapy 2001   rt breast   PERSONAL HX BREAST CANCER 09/29/2009   PVC's (premature ventricular contractions)    a. Event monitor 01/2013: NSR, extensive PVCs.   Radial neck fracture 10/2011   minimally displaced   Substance abuse (HCC)    In remission 3 years.    Urinary incontinence 03/19/2012   Vaginitis 05/22/2011    Past Surgical History:  Procedure Laterality Date   APPENDECTOMY     AUGMENTATION MAMMAPLASTY Right 05/27/2007   BREAST BIOPSY Right 01/02/2007   wire loc   BREAST BIOPSY  12/26/2006   BREAST LUMPECTOMY Right 2001   BREAST RECONSTRUCTION  2008, 2009, 2010   BREAST REDUCTION WITH MASTOPEXY Left 05/30/2017   Procedure: LEFT BREAST REDUCTION FOR SYMTERY WITH MASTOPEXY;  Surgeon: Peggye Form, DO;  Location: [facility];  Service: Plastics;  Laterality: Left;   CHOLECYSTECTOMY  2010   COLONOSCOPY  09/05/10   [facility]   DENTAL SURGERY  05/2016   4 dental implants by Dr. Retta Mac.   ERCP  2010    CBD stone extraction    ESOPHAGOGASTRODUODENOSCOPY  01/08/2012   Procedure: ESOPHAGOGASTRODUODENOSCOPY (EGD);  Surgeon: [name], MD;  Location: [facility];  Service: Endoscopy;  Laterality: N/A;   ESOPHAGOGASTRODUODENOSCOPY (EGD) WITH ESOPHAGEAL DILATION  2010, 2012   LAPAROSCOPIC APPENDECTOMY N/A 08/04/2016   Procedure: APPENDECTOMY LAPAROSCOPIC;  Surgeon: Karie Soda, MD;  Location: [facility];  Service: General;  Laterality: N/A;   LIPOSUCTION WITH LIPOFILLING Left 11/21/2017   Procedure: LIPOSUCTION FROM ABDOMEN WITH LIPOFILLING TO LEFT BREAST;  Surgeon: Peggye Form, DO;  Location: [facility];  Service: Plastics;  Laterality: Left;   MASTECTOMY MODIFIED RADICAL Right 05/27/2007   , Mastectomy modified radical (08), breast reconstruction, CA lesions excised lateral abd wall 2010   MASTOPEXY Left 11/21/2017   Procedure: LEFT  BREAST REVISION MASTOPEXY FOR SYMMETRY;  Surgeon: Peggye Form, DO;  Location: [facility];  Service: Plastics;  Laterality: Left;   REDUCTION MAMMAPLASTY Left    SAVORY DILATION  01/08/2012   Procedure: SAVORY DILATION;  Surgeon: [name], MD;  Location: [facility];  Service: Endoscopy;  Laterality: N/A;  need xray    Current Medications: Current Meds  Medication Sig   acetaminophen (TYLENOL) 500 MG tablet Take 500-1,000 mg by mouth every 6 (six) hours as needed (for pain.).   amitriptyline (ELAVIL) 25 MG tablet Take 4 tablets daily at bedtime.   aspirin 81 MG EC tablet Take 81 mg by mouth daily.   atorvastatin (LIPITOR) 20 MG tablet TAKE 1 TABLET BY MOUTH IN THE MORNING AND AT BEDTIME   Biotin 5 MG CAPS Take by mouth.   capecitabine (XELODA) 500 MG tablet Take 500 mg by mouth 2 (two) times  daily.   Cholecalciferol (VITAMIN D3) 1000 UNITS CAPS Take 2,000 Units by mouth daily.   Cyanocobalamin (VITAMIN B 12 PO) Take 1 tablet by mouth daily.   cyclobenzaprine (FLEXERIL) 10 MG tablet One half to one tab PO qHS, then increase gradually to one tab TID.   diltiazem (CARDIZEM) 30 MG tablet TAKE 1 TABLET BY MOUTH DAILY FOR PALPITATIONS   FLUoxetine (PROZAC) 40 MG capsule Take 1 capsule (40 mg total) by mouth daily.   loratadine (CLARITIN) 10 MG tablet Take 1 tablet (10 mg total) by mouth daily.   LORazepam (ATIVAN) 1 MG tablet TAKE 1 TABLET(1 MG) BY MOUTH EVERY 8 HOURS AS NEEDED FOR ANXIETY   meclizine (ANTIVERT) 12.5 MG tablet Take 1 tablet by mouth as needed.   pantoprazole (PROTONIX) 40 MG tablet Take 1 tablet (40 mg total) by mouth 2 (two) times daily before a meal.     Allergies:   Sorbitol; Tomato; Zofran; Advil [ibuprofen]; Cephalexin; Diphenhydramine hcl; Morphine and related; Tramadol; Antihistamines, chlorpheniramine-type; and Oxycodone   Social History   Socioeconomic History   Marital status: Married    Spouse name: Sam   Number of children: 3    Years of education: Not on file   Highest education level: Not on file  Occupational History   Occupation: Arboriculturist  Tobacco Use   Smoking status: Former    Packs/day: 1.00    Years: 50.00    Total pack years: 50.00    Types: Cigarettes    Quit date: 05/22/2007    Years since quitting: 15.4   Smokeless tobacco: Never  Vaping Use   Vaping Use: Never used  Substance and Sexual Activity   Alcohol use: Not Currently   Drug use: No   Sexual activity: Yes    Partners: Male    Comment: lives with husband,   Other Topics Concern   Not on file  Social History Narrative   Not on file   Social Determinants of Health   Financial Resource Strain: Low Risk  (02/27/2022)   Overall Financial Resource Strain (CARDIA)    Difficulty of Paying Living Expenses: Not hard at all  Food Insecurity: No Food Insecurity (02/27/2022)   Hunger Vital Sign    Worried About Running Out of Food in the Last Year: Never true    Ran Out of Food in the Last Year: Never true  Transportation Needs: No Transportation Needs (02/27/2022)   PRAPARE - Administrator, Civil Service (Medical): No    Lack of Transportation (Non-Medical): No  Physical Activity: Sufficiently Active (02/27/2022)   Exercise Vital Sign    Days of Exercise per Week: 7 days    Minutes of Exercise per Session: 60 min  Stress: No Stress Concern Present (02/27/2022)   [employer] of Occupational Health - Occupational Stress Questionnaire    Feeling of Stress : Not at all  Social Connections: Moderately Integrated (02/27/2022)   Social Connection and Isolation Panel [NHANES]    Frequency of Communication with Friends and Family: More than three times a week    Frequency of Social Gatherings with Friends and Family: More than three times a week    Attends Religious Services: More than 4 times per year    Active Member of [employer] or Organizations: No    Attends Banker Meetings: Never    Marital Status: Married      Family History: The patient's family history includes Alcohol abuse in her maternal grandfather; Anxiety disorder in her  sister; Arthritis in her sister; Breast cancer in her cousin, cousin, maternal aunt, maternal aunt, maternal aunt, and paternal aunt; Cancer in her maternal uncle; Cirrhosis in her sister; Esophageal cancer in her paternal grandfather; Heart disease in her father and paternal grandfather; Lung cancer in her father and paternal aunt; Osteoporosis in her sister and sister; Other in her mother; Ovarian cancer (age of onset: 71) in her maternal aunt; Pancreatic cancer in her maternal aunt and maternal uncle; Rectal cancer in her paternal grandfather; Skin cancer in her sister; Stomach cancer in her maternal uncle; Stroke in her maternal grandmother. There is no history of Anesthesia problems, Hypotension, Malignant hyperthermia, or Pseudochol deficiency.  ROS:   Please see the history of present illness.    + RLE pain All other systems reviewed and are negative.  Labs/Other Studies Reviewed:    The following studies were reviewed today:  Echo 06/22/20 1. Left ventricular ejection fraction, by estimation, is 60 to 65%. The  left ventricle has normal function. The left ventricle has no regional  wall motion abnormalities. Left ventricular diastolic parameters are  consistent with Grade I diastolic  dysfunction (impaired relaxation). The average left ventricular global  longitudinal strain is -19.8 %. The global longitudinal strain is normal.   2. Right ventricular systolic function is normal. The right ventricular  size is normal.   3. The mitral valve is normal in structure. Trivial mitral valve  regurgitation. No evidence of mitral stenosis.   4. The aortic valve is normal in structure. Aortic valve regurgitation is  not visualized. No aortic stenosis is present.   5. The inferior vena cava is normal in size with greater than 50%  respiratory variability, suggesting right  atrial pressure of 3 mmHg.   Comparison(s): 02/28/18 EF 55-60%.    Recent Labs: 10/22/2021: Magnesium 1.8 08/29/2022: ALT 18; BUN 28; Creatinine, Ser 0.77; Potassium 3.8; Sodium 138; TSH 0.71 09/03/2022: Hemoglobin 12.5; Platelets 293  Recent Lipid Panel    Component Value Date/Time   CHOL 169 08/29/2022 1441   TRIG 122.0 08/29/2022 1441   HDL 73.40 08/29/2022 1441   CHOLHDL 2 08/29/2022 1441   VLDL 24.4 08/29/2022 1441   LDLCALC 72 08/29/2022 1441   LDLDIRECT 117.4 01/13/2013 1210     Risk Assessment/Calculations:      Physical Exam:    VS:  BP 110/60   Pulse 94   Ht 5\' 5"  (1.651 m)   Wt 133 lb 9.6 oz (60.6 kg)   SpO2 96%   BMI 22.23 kg/m     Wt Readings from Last 3 Encounters:  10/22/22 133 lb 9.6 oz (60.6 kg)  08/07/22 135 lb 12.8 oz (61.6 kg)  06/05/22 131 lb 6.4 oz (59.6 kg)     GEN:  Well nourished, well developed in no acute distress HEENT: Normal NECK: No JVD; No carotid bruits CARDIAC: RRR, no murmurs, rubs, gallops RESPIRATORY:  Clear to auscultation without rales, wheezing or rhonchi  ABDOMEN: Soft, non-tender, non-distended MUSCULOSKELETAL:  No edema; No deformity. 2+ pedal pulses, equal bilaterally SKIN: Warm and dry NEUROLOGIC:  Alert and oriented x 3 PSYCHIATRIC:  Normal affect   EKG:  EKG is ordered today.  The ekg ordered today demonstrates normal sinus rhythm at 94 bpm, right ventricular conduction delay, no acute change from previous  Diagnoses:    1. Palpitations   2. Mixed hyperlipidemia   3. Preoperative cardiovascular examination   4. Orthostatic intolerance    Assessment and Plan:     Preoperative cardiac  evaluation: The patient is doing well from a cardiac perspective. She is limited by orthopedic issues, but is able to achieve > 4 METS activity without any concerning symptoms. Therefore, based on ACC/AHA guidelines, the patient would be at acceptable risk for the planned procedure without further cardiovascular testing.  According to the Revised Cardiac Risk Index (RCRI), her Perioperative Risk of Major Cardiac Event is (%): 0.9. Her Functional Capacity in METs is: 5.07 according to the Duke Activity Status Index (DASI). From a cardiac perspective, she may hold aspirin for her procedure.   Palpitations: Quiescent at this time.   Orthostatic intolerance: She continues to have soft BP and slightly elevated HR. No symptoms of orthostasis, presyncope, syncope. No medication changes today.   Mixed hyperlipidemia: LDL 72, HDL 73 on 08/29/22. Continue atorvastatin.      Disposition: 6 months with Dr. Tenny Craw  Medication Adjustments/Labs and Tests Ordered: Current medicines are reviewed at length with the patient today.  Concerns regarding medicines are outlined above.  Orders Placed This Encounter  Procedures   EKG 12-Lead   No orders of the defined types were placed in this encounter.   Patient Instructions  Medication Instructions:   Your physician recommends that you continue on your current medications as directed. Please refer to the Current Medication list given to you today.   *If you need a refill on your cardiac medications before your next appointment, please call your pharmacy*   Lab Work:  None ordered.  If you have labs (blood work) drawn today and your tests are completely normal, you will receive your results only by: MyChart Message (if you have MyChart) OR A paper copy in the mail If you have any lab test that is abnormal or we need to change your treatment, we will call you to review the results.   Testing/Procedures:  None ordered.   Follow-Up: At [facility], you and your health needs are our priority.  As part of our continuing mission to provide you with exceptional heart care, we have created designated Provider Care Teams.  These Care Teams include your primary Cardiologist (physician) and Advanced Practice Providers (APPs -  Physician Assistants and Nurse  Practitioners) who all work together to provide you with the care you need, when you need it.  We recommend signing up for the patient portal called "MyChart".  Sign up information is provided on this After Visit Summary.  MyChart is used to connect with patients for Virtual Visits (Telemedicine).  Patients are able to view lab/test results, encounter notes, upcoming appointments, etc.  Non-urgent messages can be sent to your provider as well.   To learn more about what you can do with MyChart, go to [email or web address].    Your next appointment:   6 month(s)  The format for your next appointment:   In Person  Provider:   Dietrich Pates, MD     Important Information About Sugar         Signed, Levi Aland, NP  10/22/2022 4:24 PM    [facility]

## 2022-10-22 ENCOUNTER — Ambulatory Visit: Payer: Medicare HMO | Attending: Nurse Practitioner | Admitting: Nurse Practitioner

## 2022-10-22 ENCOUNTER — Encounter: Payer: Self-pay | Admitting: Nurse Practitioner

## 2022-10-22 ENCOUNTER — Other Ambulatory Visit: Payer: Self-pay

## 2022-10-22 VITALS — BP 110/60 | HR 94 | Ht 65.0 in | Wt 133.6 lb

## 2022-10-22 DIAGNOSIS — R002 Palpitations: Secondary | ICD-10-CM

## 2022-10-22 DIAGNOSIS — Z01810 Encounter for preprocedural cardiovascular examination: Secondary | ICD-10-CM | POA: Diagnosis not present

## 2022-10-22 DIAGNOSIS — E782 Mixed hyperlipidemia: Secondary | ICD-10-CM | POA: Diagnosis not present

## 2022-10-22 DIAGNOSIS — I951 Orthostatic hypotension: Secondary | ICD-10-CM | POA: Diagnosis not present

## 2022-10-22 DIAGNOSIS — M545 Low back pain, unspecified: Secondary | ICD-10-CM

## 2022-10-22 MED ORDER — CYCLOBENZAPRINE HCL 10 MG PO TABS: ORAL_TABLET | ORAL | 0 refills | Status: DC

## 2022-10-22 NOTE — Patient Instructions (Signed)
Medication Instructions:   Your physician recommends that you continue on your current medications as directed. Please refer to the Current Medication list given to you today.   *If you need a refill on your cardiac medications before your next appointment, please call your pharmacy*   Lab Work:  None ordered.  If you have labs (blood work) drawn today and your tests are completely normal, you will receive your results only by: [place] (if you have MyChart) OR A paper copy in the mail If you have any lab test that is abnormal or we need to change your treatment, we will call you to review the results.   Testing/Procedures:  None ordered.   Follow-Up: At [facility], you and your health needs are our priority.  As part of our continuing mission to provide you with exceptional heart care, we have created designated Provider Care Teams.  These Care Teams include your primary Cardiologist (physician) and Advanced Practice Providers (APPs -  Physician Assistants and Nurse Practitioners) who all work together to provide you with the care you need, when you need it.  We recommend signing up for the patient portal called "MyChart".  Sign up information is provided on this After Visit Summary.  MyChart is used to connect with patients for Virtual Visits (Telemedicine).  Patients are able to view lab/test results, encounter notes, upcoming appointments, etc.  Non-urgent messages can be sent to your provider as well.   To learn more about what you can do with MyChart, go to [email or web address].    Your next appointment:   6 month(s)  The format for your next appointment:   In Person  Provider:   Dorris Carnes, MD     Important Information About Sugar

## 2022-10-24 DIAGNOSIS — C792 Secondary malignant neoplasm of skin: Secondary | ICD-10-CM | POA: Diagnosis not present

## 2022-10-24 DIAGNOSIS — C50911 Malignant neoplasm of unspecified site of right female breast: Secondary | ICD-10-CM | POA: Diagnosis not present

## 2022-10-24 DIAGNOSIS — M5410 Radiculopathy, site unspecified: Secondary | ICD-10-CM | POA: Diagnosis not present

## 2022-10-26 ENCOUNTER — Ambulatory Visit (INDEPENDENT_AMBULATORY_CARE_PROVIDER_SITE_OTHER): Payer: Medicare HMO

## 2022-10-26 ENCOUNTER — Ambulatory Visit (INDEPENDENT_AMBULATORY_CARE_PROVIDER_SITE_OTHER): Payer: Medicare HMO | Admitting: Sports Medicine

## 2022-10-26 DIAGNOSIS — M1711 Unilateral primary osteoarthritis, right knee: Secondary | ICD-10-CM | POA: Diagnosis not present

## 2022-10-26 DIAGNOSIS — M545 Low back pain, unspecified: Secondary | ICD-10-CM

## 2022-10-26 MED ORDER — HYALURONAN 30 MG/2ML IX SOSY
30.0000 mg | PREFILLED_SYRINGE | Freq: Once | INTRA_ARTICULAR | Status: AC
  Administered 2022-10-26: 30 mg via INTRA_ARTICULAR

## 2022-10-26 MED ORDER — PREGABALIN 25 MG PO CAPS: ORAL_CAPSULE | ORAL | 3 refills | Status: DC

## 2022-10-26 NOTE — Assessment & Plan Note (Signed)
Persistent right-sided axial low back pain, historically has done well with SI joint injections but did not get relief after her most recent injection. Pain is predominantly posterior right buttock with radiation down the anterolateral and posterior thigh. X-rays did show multilevel spondylitic change with spinal stenosis L4-L5 which I think is the main pain generator, she did recently get methylprednisolone by an outside provider, this is unfortunately not helping. MRI is scheduled. Tramadol did not provide sufficient relief. We will switch to Lyrica in a slow up taper.

## 2022-10-26 NOTE — Assessment & Plan Note (Signed)
Orthovisc No. 2 of 4 right knee, return in 1 week for #3 of 4.

## 2022-10-26 NOTE — Progress Notes (Signed)
    Procedures performed today:    Procedure: Real-time Ultrasound Guided injection of the right knee Device: Samsung HS60  Verbal informed consent obtained.  Time-out conducted.  Noted no overlying erythema, induration, or other signs of local infection.  Skin prepped in a sterile fashion.  Local anesthesia: Topical Ethyl chloride.  With sterile technique and under real time ultrasound guidance: Trace effusion, 30 mg/2 mL of OrthoVisc (sodium hyaluronate) in a prefilled syringe was injected easily into the knee through a 22-gauge needle. Completed without difficulty  Advised to call if fevers/chills, erythema, induration, drainage, or persistent bleeding.  Images permanently stored and available for review in PACS.  Impression: Technically successful ultrasound guided injection.  Independent interpretation of notes and tests performed by another provider:   None.  Brief History, Exam, Impression, and Recommendations:    Primary osteoarthritis of right knee Orthovisc No. 2 of 4 right knee, return in 1 week for #3 of 4.  Acute right-sided low back pain Persistent right-sided axial low back pain, historically has done well with SI joint injections but did not get relief after her most recent injection. Pain is predominantly posterior right buttock with radiation down the anterolateral and posterior thigh. X-rays did show multilevel spondylitic change with spinal stenosis L4-L5 which I think is the main pain generator, she did recently get methylprednisolone by an outside provider, this is unfortunately not helping. MRI is scheduled. Tramadol did not provide sufficient relief. We will switch to Lyrica in a slow up taper.  Chronic process with exacerbation and pharmacologic intervention  ____________________________________________ Gwen Her. Dianah Field, M.D., ABFM., CAQSM., AME. Primary Care and Sports Medicine [facility]  Adjunct Professor of [employer] Medicine  Risk manager

## 2022-10-27 ENCOUNTER — Ambulatory Visit (INDEPENDENT_AMBULATORY_CARE_PROVIDER_SITE_OTHER): Payer: Medicare HMO

## 2022-10-27 DIAGNOSIS — M545 Low back pain, unspecified: Secondary | ICD-10-CM | POA: Diagnosis not present

## 2022-10-27 DIAGNOSIS — Z853 Personal history of malignant neoplasm of breast: Secondary | ICD-10-CM

## 2022-10-27 DIAGNOSIS — M4726 Other spondylosis with radiculopathy, lumbar region: Secondary | ICD-10-CM | POA: Diagnosis not present

## 2022-10-27 DIAGNOSIS — M5116 Intervertebral disc disorders with radiculopathy, lumbar region: Secondary | ICD-10-CM | POA: Diagnosis not present

## 2022-10-30 ENCOUNTER — Encounter: Payer: Self-pay | Admitting: Sports Medicine

## 2022-10-31 ENCOUNTER — Other Ambulatory Visit (HOSPITAL_COMMUNITY): Payer: Self-pay

## 2022-11-01 ENCOUNTER — Other Ambulatory Visit (HOSPITAL_BASED_OUTPATIENT_CLINIC_OR_DEPARTMENT_OTHER): Payer: Self-pay

## 2022-11-01 ENCOUNTER — Other Ambulatory Visit: Payer: Self-pay | Admitting: Orthopedic Surgery

## 2022-11-01 MED ORDER — FLUAD QUADRIVALENT 0.5 ML IM PRSY
PREFILLED_SYRINGE | INTRAMUSCULAR | 0 refills | Status: DC
  Filled 2022-11-01: qty 0.5, 1d supply, fill #0

## 2022-11-08 ENCOUNTER — Emergency Department (HOSPITAL_BASED_OUTPATIENT_CLINIC_OR_DEPARTMENT_OTHER): Payer: Medicare HMO

## 2022-11-08 ENCOUNTER — Ambulatory Visit (INDEPENDENT_AMBULATORY_CARE_PROVIDER_SITE_OTHER): Payer: Medicare HMO

## 2022-11-08 ENCOUNTER — Emergency Department (HOSPITAL_BASED_OUTPATIENT_CLINIC_OR_DEPARTMENT_OTHER)
Admission: EM | Admit: 2022-11-08 | Discharge: 2022-11-09 | Disposition: A | Discharge status: Home / Self Care | Payer: Medicare HMO | Attending: Emergency Medicine | Admitting: Emergency Medicine

## 2022-11-08 ENCOUNTER — Encounter (HOSPITAL_BASED_OUTPATIENT_CLINIC_OR_DEPARTMENT_OTHER): Payer: Self-pay | Admitting: Emergency Medicine

## 2022-11-08 ENCOUNTER — Ambulatory Visit (INDEPENDENT_AMBULATORY_CARE_PROVIDER_SITE_OTHER): Payer: Medicare HMO | Admitting: Sports Medicine

## 2022-11-08 ENCOUNTER — Other Ambulatory Visit: Payer: Self-pay

## 2022-11-08 DIAGNOSIS — Z23 Encounter for immunization: Secondary | ICD-10-CM | POA: Insufficient documentation

## 2022-11-08 DIAGNOSIS — W010XXA Fall on same level from slipping, tripping and stumbling without subsequent striking against object, initial encounter: Secondary | ICD-10-CM

## 2022-11-08 DIAGNOSIS — S0101XA Laceration without foreign body of scalp, initial encounter: Secondary | ICD-10-CM | POA: Diagnosis not present

## 2022-11-08 DIAGNOSIS — M160 Bilateral primary osteoarthritis of hip: Secondary | ICD-10-CM

## 2022-11-08 DIAGNOSIS — Z853 Personal history of malignant neoplasm of breast: Secondary | ICD-10-CM | POA: Insufficient documentation

## 2022-11-08 DIAGNOSIS — M1711 Unilateral primary osteoarthritis, right knee: Secondary | ICD-10-CM | POA: Diagnosis not present

## 2022-11-08 DIAGNOSIS — Z7951 Long term (current) use of inhaled steroids: Secondary | ICD-10-CM | POA: Diagnosis not present

## 2022-11-08 DIAGNOSIS — J45909 Unspecified asthma, uncomplicated: Secondary | ICD-10-CM | POA: Diagnosis not present

## 2022-11-08 DIAGNOSIS — W101XXA Fall (on)(from) sidewalk curb, initial encounter: Secondary | ICD-10-CM | POA: Diagnosis not present

## 2022-11-08 DIAGNOSIS — Y92480 Sidewalk as the place of occurrence of the external cause: Secondary | ICD-10-CM | POA: Diagnosis not present

## 2022-11-08 DIAGNOSIS — S90511A Abrasion, right ankle, initial encounter: Secondary | ICD-10-CM | POA: Insufficient documentation

## 2022-11-08 DIAGNOSIS — I2510 Atherosclerotic heart disease of native coronary artery without angina pectoris: Secondary | ICD-10-CM | POA: Insufficient documentation

## 2022-11-08 DIAGNOSIS — M47816 Spondylosis without myelopathy or radiculopathy, lumbar region: Secondary | ICD-10-CM | POA: Diagnosis not present

## 2022-11-08 DIAGNOSIS — S5001XA Contusion of right elbow, initial encounter: Secondary | ICD-10-CM

## 2022-11-08 DIAGNOSIS — S0003XA Contusion of scalp, initial encounter: Secondary | ICD-10-CM

## 2022-11-08 DIAGNOSIS — R69 Illness, unspecified: Secondary | ICD-10-CM | POA: Diagnosis not present

## 2022-11-08 DIAGNOSIS — Z7982 Long term (current) use of aspirin: Secondary | ICD-10-CM | POA: Diagnosis not present

## 2022-11-08 DIAGNOSIS — F0390 Unspecified dementia without behavioral disturbance: Secondary | ICD-10-CM | POA: Insufficient documentation

## 2022-11-08 DIAGNOSIS — Z87891 Personal history of nicotine dependence: Secondary | ICD-10-CM | POA: Insufficient documentation

## 2022-11-08 DIAGNOSIS — J449 Chronic obstructive pulmonary disease, unspecified: Secondary | ICD-10-CM | POA: Diagnosis not present

## 2022-11-08 DIAGNOSIS — S51011A Laceration without foreign body of right elbow, initial encounter: Secondary | ICD-10-CM | POA: Diagnosis not present

## 2022-11-08 MED ORDER — HYALURONAN 30 MG/2ML IX SOSY
30.0000 mg | PREFILLED_SYRINGE | Freq: Once | INTRA_ARTICULAR | Status: AC
  Administered 2022-11-08: 30 mg via INTRA_ARTICULAR

## 2022-11-08 NOTE — Progress Notes (Signed)
    Procedures performed today:    Procedure: Real-time Ultrasound Guided injection of the right knee Device: Samsung HS60  Verbal informed consent obtained.  Time-out conducted.  Noted no overlying erythema, induration, or other signs of local infection.  Skin prepped in a sterile fashion.  Local anesthesia: Topical Ethyl chloride.  With sterile technique and under real time ultrasound guidance: Trace effusion, 30 mg/2 mL of OrthoVisc (sodium hyaluronate) in a prefilled syringe was injected easily into the knee through a 22-gauge needle. Completed without difficulty  Advised to call if fevers/chills, erythema, induration, drainage, or persistent bleeding.  Images permanently stored and available for review in PACS.  Impression: Technically successful ultrasound guided injection.  Independent interpretation of notes and tests performed by another provider:   None.  Brief History, Exam, Impression, and Recommendations:    Primary osteoarthritis of right knee Orthovisc 3 of 4 right knee, return in 1 week for #4 of 4  Primary osteoarthritis of both hips Hip replacement coming up with Dr. Berenice Primas.  Right-sided.  Lumbar spondylosis Long history of right-sided axial low back pain, historically has done well with SI joint injections but did not get relief after the most recent SI joint injection about a month ago. Pain is predominantly right buttock with radiation down the anterolateral and posterior thigh, she is also starting to get some pain dorsal foot. 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 My advice would give Lyrica the due diligence of an upward taper before considering an epidural, right L4-L5 interlaminar.    ____________________________________________ Gwen Her. Dianah Field, M.D., ABFM., CAQSM., AME. Primary Care and Sports Medicine [facility]  Adjunct Professor of [employer] of Medicine  Risk manager

## 2022-11-08 NOTE — Assessment & Plan Note (Signed)
Orthovisc 3 of 4 right knee, return in 1 week for #4 of 4

## 2022-11-08 NOTE — Assessment & Plan Note (Signed)
Long history of right-sided axial low back pain, historically has done well with SI joint injections but did not get relief after the most recent SI joint injection about a month ago. Pain is predominantly right buttock with radiation down the anterolateral and posterior thigh, she is also starting to get some pain dorsal foot. 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 My advice would give Lyrica the due diligence of an upward taper before considering an epidural, right L4-L5 interlaminar.

## 2022-11-08 NOTE — ED Triage Notes (Signed)
Fall in parking lot. Lost balance stepping off curb Hit head, no loc. Lac on right side of head. Abrasion/lac to right elbow. Lac on right ankle. Denies neck pain, able to move all extremities.

## 2022-11-08 NOTE — Assessment & Plan Note (Signed)
Hip replacement coming up with Dr. Berenice Primas.  Right-sided.

## 2022-11-09 ENCOUNTER — Telehealth: Payer: Self-pay

## 2022-11-09 MED ORDER — TETANUS-DIPHTH-ACELL PERTUSSIS 5-2.5-18.5 LF-MCG/0.5 IM SUSY
0.5000 mL | PREFILLED_SYRINGE | Freq: Once | INTRAMUSCULAR | Status: AC
  Administered 2022-11-09: 0.5 mL via INTRAMUSCULAR
  Filled 2022-11-09: qty 0.5

## 2022-11-09 NOTE — Progress Notes (Signed)
Pt. Needs orders for surgery. 

## 2022-11-09 NOTE — Patient Instructions (Signed)
DUE TO COVID-19 ONLY TWO VISITORS  (aged 80 and older)  ARE ALLOWED TO COME WITH YOU AND STAY IN THE WAITING ROOM ONLY DURING PRE OP AND PROCEDURE.   **NO VISITORS ARE ALLOWED IN THE SHORT STAY AREA OR RECOVERY ROOM!!**  IF YOU WILL BE ADMITTED INTO THE HOSPITAL YOU ARE ALLOWED ONLY FOUR SUPPORT PEOPLE DURING VISITATION HOURS ONLY (7 AM -8PM)   The support person(s) must pass our screening, gel in and out, and wear a mask at all times, including in the patient's room. Patients must also wear a mask when staff or their support person are in the room. Visitors GUEST BADGE MUST BE WORN VISIBLY  One adult visitor may remain with you overnight and MUST be in the room by 8 P.M.     Your procedure is scheduled on: 11/19/22   Report to [name] Main Entrance    Report to admitting at : 5:15 AM   Call this number if you have problems the morning of surgery [phone]   Do not eat food :After Midnight.   After Midnight you may have the following liquids until : 4:30 AM DAY OF SURGERY  Water Black Coffee (sugar ok, NO MILK/CREAM OR CREAMERS)  Tea (sugar ok, NO MILK/CREAM OR CREAMERS) regular and decaf                             Plain Jell-O (NO RED)                                           Fruit ices (not with fruit pulp, NO RED)                                     Popsicles (NO RED)                                                                  Juice: apple, WHITE grape, WHITE cranberry Sports drinks like Gatorade (NO RED)   The day of surgery:  Drink ONE (1) Pre-Surgery Clear Ensure or G2 at: 4:30  AM the morning of surgery. Drink in one sitting. Do not sip.  This drink was given to you during your hospital  pre-op appointment visit. Nothing else to drink after completing the  Pre-Surgery Clear Ensure or G2.          If you have questions, please contact your surgeon's office.   Oral Hygiene is also important to reduce your risk of infection.                                     Remember - BRUSH YOUR TEETH THE MORNING OF SURGERY WITH YOUR REGULAR TOOTHPASTE  DENTURES WILL BE REMOVED PRIOR TO SURGERY PLEASE DO NOT APPLY "Poly grip" OR ADHESIVES!!!   Do NOT smoke after Midnight   Take these medicines the morning of surgery with A SIP OF WATER: pregabalin,fluoxetine,loratadine,xeloda,diltiazem,pantoprazole.Tylenol,lorazepam as needed.  You may not have any metal on your body including hair pins, jewelry, and body piercing             Do not wear make-up, lotions, powders, perfumes/cologne, or deodorant  Do not wear nail polish including gel and S&S, artificial/acrylic nails, or any other type of covering on natural nails including finger and toenails. If you have artificial nails, gel coating, etc. that needs to be removed by a nail salon please have this removed prior to surgery or surgery may need to be canceled/ delayed if the surgeon/ anesthesia feels like they are unable to be safely monitored.   Do not shave  48 hours prior to surgery.     Do not bring valuables to the hospital. [place].   Contacts, glasses, or bridgework may not be worn into surgery.   Bring small overnight bag day of surgery.   DO NOT [place]. PHARMACY WILL DISPENSE MEDICATIONS LISTED ON YOUR MEDICATION LIST TO YOU DURING YOUR ADMISSION [place]!    Patients discharged on the day of surgery will not be allowed to drive home.  Someone NEEDS to stay with you for the first 24 hours after anesthesia.   Special Instructions: Bring a copy of your healthcare power of attorney and living will documents         the day of surgery if you haven't scanned them before.              Please read over the following fact sheets you were given: IF YOU HAVE QUESTIONS ABOUT YOUR PRE-OP INSTRUCTIONS PLEASE CALL [phone]    [facility] - Preparing for Surgery Before surgery,  you can play an important role.  Because skin is not sterile, your skin needs to be as free of germs as possible.  You can reduce the number of germs on your skin by washing with CHG (chlorahexidine gluconate) soap before surgery.  CHG is an antiseptic cleaner which kills germs and bonds with the skin to continue killing germs even after washing. Please DO NOT use if you have an allergy to CHG or antibacterial soaps.  If your skin becomes reddened/irritated stop using the CHG and inform your nurse when you arrive at Short Stay. Do not shave (including legs and underarms) for at least 48 hours prior to the first CHG shower.  You may shave your face/neck. Please follow these instructions carefully:  1.  Shower with CHG Soap the night before surgery and the  morning of Surgery.  2.  If you choose to wash your hair, wash your hair first as usual with your  normal  shampoo.  3.  After you shampoo, rinse your hair and body thoroughly to remove the  shampoo.                           4.  Use CHG as you would any other liquid soap.  You can apply chg directly  to the skin and wash                       Gently with a scrungie or clean washcloth.  5.  Apply the CHG Soap to your body ONLY FROM THE NECK DOWN.   Do not use on face/ open  Wound or open sores. Avoid contact with eyes, ears mouth and genitals (private parts).                       Wash face,  Genitals (private parts) with your normal soap.             6.  Wash thoroughly, paying special attention to the area where your surgery  will be performed.  7.  Thoroughly rinse your body with warm water from the neck down.  8.  DO NOT shower/wash with your normal soap after using and rinsing off  the CHG Soap.                9.  Pat yourself dry with a clean towel.            10.  Wear clean pajamas.            11.  Place clean sheets on your bed the night of your first shower and do not  sleep with pets. Day of Surgery : Do not  apply any lotions/deodorants the morning of surgery.  Please wear clean clothes to the hospital/surgery center.  FAILURE TO FOLLOW THESE INSTRUCTIONS MAY RESULT IN THE CANCELLATION OF YOUR SURGERY PATIENT SIGNATURE_________________________________  NURSE SIGNATURE__________________________________  ________________________________________________________________________

## 2022-11-09 NOTE — Telephone Encounter (Signed)
Initiated Prior authorization ADL:[id number] '25MG'$  capsules Via: Covermymeds Case/Key:[id number] Status: Pending as of 11/09/22 Reason: Notified Pt via: Mychart

## 2022-11-09 NOTE — ED Provider Notes (Signed)
[facility] Provider Note: Anne Spurling, MD, FACEP  CSN: [id number] MRN: [id number] ARRIVAL: 11/08/22 at [place]: [place] OF PRESENT ILLNESS  11/09/22 12:15 AM Anne [name] is a 81 y.o. female who lost her balance stepping off a curb in a parking lot yesterday evening and hit her head.  She did not lose consciousness.  She is not on anticoagulation.  She has a laceration and hematoma to her right parietal scalp.  She has a skin tear to her right elbow and an abrasion to her lateral right ankle.  She rates associated pain (headache) as a 4 out of 10.  She is having minimal pain in her right elbow and right ankle.  Tetanus is not up-to-date.   Past Medical History:  Diagnosis Date   ALKALINE PHOSPHATASE, ELEVATED 03/15/2009   Allergic state 06/10/2012   Allergy    Anemia    Anxiety and depression 04/28/2011   Arthritis    Asthma    Atypical chest pain 11/30/2011   AVM (arteriovenous malformation) of colon 2011   cecum   Baker's cyst of knee 05/22/2011   Cancer ([place]) 11/2006   XRT/chemo 01-02/ lobular invasive ca   Carotid artery disease ([place])    a. Carotid duplex 03/2014: stable 1-39% BICA, f/u due 03/2016.   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     Clotting disorder ([place])    COPD (chronic obstructive pulmonary disease) (HCC)    Dementia (HCC)    Dermatitis 76/28/3151   EE (eosinophilic esophagitis)    Emphysema of lung ([place])    Esophageal ring    ESOPHAGEAL STRICTURE 03/29/2009   Fall 11/23/2012   Family history of breast cancer    Family history of colon cancer    Family history of ovarian cancer    Family history of pancreatic cancer    Folliculitis of nose 76/16/0737   GERD (gastroesophageal reflux disease) 09/29/2009   improved s/p cholecystectomy and esophagus dilatation   History of chicken pox    History of measles    History of  shingles    2 episodes   Hx of echocardiogram    a. Echo 01/2013: mild LVH, EF 55-60%, normal wall motion, Gr 1 diast dysfn   Hyperlipidemia    Knee pain, bilateral 07/23/2011   Medicare annual wellness visit, subsequent 06/19/2015   Mixed hyperlipidemia 10/17/2010   Qualifier: Diagnosis of  By: Mack Guise     Orthostasis    Osteopenia 03/14/2011   Osteoporosis    Personal history of chemotherapy 2001   Personal history of radiation therapy 2001   rt breast   PERSONAL HX BREAST CANCER 09/29/2009   PVC's (premature ventricular contractions)    a. Event monitor 01/2013: NSR, extensive PVCs.   Radial neck fracture 10/2011   minimally displaced   Substance abuse ([place])    In remission 3 years.    Urinary incontinence 03/19/2012   Vaginitis 05/22/2011    Past Surgical History:  Procedure Laterality Date   APPENDECTOMY     AUGMENTATION MAMMAPLASTY Right 05/27/2007   BREAST BIOPSY Right 01/02/2007   wire loc   BREAST BIOPSY  12/26/2006   BREAST LUMPECTOMY Right 2001   BREAST RECONSTRUCTION  2008, 2009, 2010   BREAST REDUCTION WITH MASTOPEXY Left 05/30/2017   Procedure: LEFT BREAST REDUCTION FOR SYMTERY WITH MASTOPEXY;  Surgeon: Wallace Going, DO;  Location:  [facility];  Service: Plastics;  Laterality: Left;   CHOLECYSTECTOMY  2010   COLONOSCOPY  09/05/10   [facility]   DENTAL SURGERY  05/2016   4 dental implants by Dr. Loyal Gambler.   ERCP  2010    CBD stone extraction    ESOPHAGOGASTRODUODENOSCOPY  01/08/2012   Procedure: ESOPHAGOGASTRODUODENOSCOPY (EGD);  Surgeon: Gatha Mayer, MD;  Location: [facility];  Service: Endoscopy;  Laterality: N/A;   ESOPHAGOGASTRODUODENOSCOPY (EGD) WITH ESOPHAGEAL DILATION  2010, 2012   LAPAROSCOPIC APPENDECTOMY N/A 08/04/2016   Procedure: APPENDECTOMY LAPAROSCOPIC;  Surgeon: Michael Boston, MD;  [facility];  Service: General;  Laterality: N/A;   LIPOSUCTION WITH LIPOFILLING Left 11/21/2017   Procedure: LIPOSUCTION FROM  ABDOMEN WITH LIPOFILLING TO LEFT BREAST;  Surgeon: Wallace Going, DO;  Location: [place];  Service: Plastics;  Laterality: Left;   MASTECTOMY MODIFIED RADICAL Right 05/27/2007   , Mastectomy modified radical (08), breast reconstruction, CA lesions excised lateral abd wall 2010   MASTOPEXY Left 11/21/2017   Procedure: LEFT BREAST REVISION MASTOPEXY FOR SYMMETRY;  Surgeon: Wallace Going, DO;  Location: [facility];  Service: Plastics;  Laterality: Left;   REDUCTION MAMMAPLASTY Left    SAVORY DILATION  01/08/2012   Procedure: SAVORY DILATION;  Surgeon: Gatha Mayer, MD;  [facility];  Service: Endoscopy;  Laterality: N/A;  need xray    Family History  Problem Relation Age of Onset   Other Mother        tic [place]   Heart disease Father    Lung cancer Father        smoker   Cirrhosis Sister        Primary Biliary   Anxiety disorder Sister    Osteoporosis Sister    Arthritis Sister        Rheumatoid   Osteoporosis Sister    Skin cancer Sister        multiple skin cancers, over 82 excisions.   Stroke Maternal Grandmother    Alcohol abuse Maternal Grandfather    Heart disease Paternal Grandfather    Esophageal cancer Paternal Grandfather    Rectal cancer Paternal Grandfather    Breast cancer Maternal Aunt        dx in her 82s   Breast cancer Maternal Aunt        dx in her 80s   Breast cancer Maternal Aunt        possible breast cancer dx and died in her 2s   Ovarian cancer Maternal Aunt 29   Pancreatic cancer Maternal Aunt        dx in her 18s   Pancreatic cancer Maternal Uncle        dx in his 19s; smoker   Stomach cancer Maternal Uncle    Cancer Maternal Uncle    Breast cancer Paternal Aunt        dx in her 70s   Lung cancer Paternal Aunt    Breast cancer Cousin        paternal first cousin   Breast cancer Cousin        maternal first cousin   Anesthesia problems Neg Hx    Hypotension Neg Hx    Malignant  hyperthermia Neg Hx    Pseudochol deficiency Neg Hx     Social History   Tobacco Use   Smoking status: Former    Packs/day: 1.00    Years: 50.00    Total pack years: 50.00  Types: Cigarettes    Quit date: 05/22/2007    Years since quitting: 15.4   Smokeless tobacco: Never  Vaping Use   Vaping Use: Never used  Substance Use Topics   Alcohol use: Not Currently   Drug use: No    Prior to Admission medications   Medication Sig Start Date End Date Taking? Authorizing Provider  acetaminophen (TYLENOL) 500 MG tablet Take 500-1,000 mg by mouth every 6 (six) hours as needed for moderate pain.    [provider]  amitriptyline (ELAVIL) 25 MG tablet Take 4 tablets daily at bedtime. 03/13/22   [name], MD  aspirin 81 MG EC tablet Take 81 mg by mouth daily.    [provider]  atorvastatin (LIPITOR) 20 MG tablet TAKE 1 TABLET BY MOUTH IN THE MORNING AND AT BEDTIME Patient taking differently: Take 40 mg by mouth at bedtime. 08/14/22   Fay Records, MD  Biotin 5 MG CAPS Take 5 mg by mouth daily.    [provider]  Brimonidine Tartrate (LUMIFY) 0.025 % SOLN Place 1 drop into both eyes daily.    [provider]  capecitabine (XELODA) 150 MG tablet Take 600-900 mg by mouth See admin instructions. Take 900 mg by mouth in the morning and 600 mg in the evening daily every other week. 05/14/22   [provider]  Cholecalciferol (VITAMIN D3) 1000 UNITS CAPS Take 2,000 Units by mouth daily.    [provider]  Cyanocobalamin (VITAMIN B 12 PO) Take 2 tablets by mouth daily.    [provider]  cyclobenzaprine (FLEXERIL) 10 MG tablet One half to one tab PO qHS, then increase gradually to one tab TID. 10/22/22   Silverio Decamp, MD  diclofenac Sodium (VOLTAREN ARTHRITIS PAIN) 1 % GEL Apply 2 g topically daily as needed (hip pain).    [provider]  diltiazem (CARDIZEM) 30 MG tablet TAKE 1 TABLET BY MOUTH DAILY FOR  PALPITATIONS 06/04/22   Fay Records, MD  FLUoxetine (PROZAC) 40 MG capsule Take 1 capsule (40 mg total) by mouth daily. 03/08/22   Ann Held, DO  Folic Acid (FOLATE PO) Take 1 tablet by mouth daily.    [provider]  influenza vaccine adjuvanted (FLUAD QUADRIVALENT) 0.5 ML injection Inject into the muscle. 11/01/22   Carlyle Basques, MD  loratadine (CLARITIN) 10 MG tablet Take 1 tablet (10 mg total) by mouth daily. 04/29/18   [name], MD  LORazepam (ATIVAN) 1 MG tablet TAKE 1 TABLET(1 MG) BY MOUTH EVERY 8 HOURS AS NEEDED FOR ANXIETY 08/09/22   [name], MD  pantoprazole (PROTONIX) 40 MG tablet Take 1 tablet (40 mg total) by mouth 2 (two) times daily before a meal. 10/25/21   Gatha Mayer, MD  pregabalin (LYRICA) 25 MG capsule 1 capsule p.o. nightly for a week then 1 capsule p.o. twice daily for a week then 2 capsules p.o. twice daily for a week then 3 capsules p.o. twice daily, only increase dose if previous dose not effective 10/26/22   Silverio Decamp, MD  Pyridoxine HCl (B-6 PO) Take 1 tablet by mouth daily.    [provider]  Thiamine HCl (B-1 PO) Take 1 tablet by mouth daily.    [provider]    Allergies Sorbitol; Tomato; Zofran; Advil [ibuprofen]; Cephalexin; Diphenhydramine hcl; Morphine and related; Tramadol; Antihistamines, chlorpheniramine-type; and Oxycodone   REVIEW OF SYSTEMS  Negative except as noted here or in the History  of Present Illness.   PHYSICAL EXAMINATION  Initial Vital Signs Blood pressure 133/86, pulse 86, temperature 97.9 F (36.6 C), resp. rate 18, SpO2 99 %.  Examination General: Well-developed, well-nourished female in no acute distress; appearance consistent with age of record HENT: normocephalic; hematoma and small laceration to right parietal scalp Eyes: Normal appearance Neck: supple; nontender Heart: regular rate and rhythm Lungs: clear to auscultation bilaterally Abdomen: soft;  nondistended; nontender; bowel sounds present Extremities: No acute deformity; superficial abrasion over right lateral malleolus; skin tear and ecchymosis right elbow:    Neurologic: Awake, alert and oriented; motor function intact in all extremities and symmetric; no facial droop Skin: Warm and dry Psychiatric: Normal mood and affect   RESULTS  Summary of this visit's results, reviewed and interpreted by myself:   EKG Interpretation  Date/Time:    Ventricular Rate:    PR Interval:    QRS Duration:   QT Interval:    QTC Calculation:   R Axis:     Text Interpretation:         Laboratory Studies: No results found. However, due to the size of the patient record, not all encounters were searched. Please check Results Review for a complete set of results. Imaging Studies: CT Head Wo Contrast  Result Date: 11/08/2022 CLINICAL DATA:  Fall in a parking lot, striking the right side of the head. EXAM: CT HEAD WITHOUT CONTRAST TECHNIQUE: Contiguous axial images were obtained from the base of the skull through the vertex without intravenous contrast. RADIATION DOSE REDUCTION: This exam was performed according to the departmental dose-optimization program which includes automated exposure control, adjustment of the mA and/or kV according to patient size and/or use of iterative reconstruction technique. COMPARISON:  05/20/2013 FINDINGS: Brain: The brainstem, cerebellum, cerebral peduncles, thalami, basal ganglia, basilar cisterns, and ventricular system appear within normal limits. No intracranial hemorrhage, mass lesion, or acute CVA. Vascular: There is atherosclerotic calcification of the cavernous carotid arteries bilaterally. Skull: Unremarkable Sinuses/Orbits: Unremarkable Other: Right scalp hematoma, image 22 series 2. IMPRESSION: 1. No acute intracranial findings. 2. Right scalp hematoma. 3. Atherosclerosis. Electronically Signed   By: Van Clines M.D.   On: 11/08/2022 19:26   [place]  LIMITED JOINT SPACE STRUCTURES LOW RIGHT  Result Date: 11/08/2022 Procedure: Real-time Ultrasound Guided injection of the right knee Device: Samsung [id number] Verbal informed consent obtained. Time-out conducted. Noted no overlying erythema, induration, or other signs of local infection. Skin prepped in a sterile fashion. Local anesthesia: Topical Ethyl chloride. With sterile technique and under real time ultrasound guidance: Trace effusion, 30 mg/2 mL of OrthoVisc (sodium hyaluronate) in a prefilled syringe was injected easily into the knee through a 22-gauge needle. Completed without difficulty Advised to call if fevers/chills, erythema, induration, drainage, or persistent bleeding. Images permanently stored and available for review in PACS. Impression: Technically successful ultrasound guided injection.    ED COURSE and MDM  Nursing notes, initial and subsequent vitals signs, including pulse oximetry, reviewed and interpreted by myself.  Vitals:   11/08/22 1848 11/09/22 0056  BP: 133/86 125/74  Pulse: 86 87  Resp: 18 18  Temp: 97.9 F (36.6 C) 98.4 F (36.9 C)  TempSrc:  Oral  SpO2: 99% 95%   Medications  Tdap (BOOSTRIX) injection 0.5 mL (0.5 mLs Intramuscular Given 11/09/22 0034)   Patient given Tdap booster as her tetanus is not up-to-date and she has acute, tetanus prone wounds.  Her head CT is negative for acute intracranial or bony injury.  I do not believe  radiographs of the right elbow or right ankle are indicated due to lack of significant pain or tenderness.  The skin tear of the right elbow is not amenable to primary closure we will allow it to heal in by secondary intention.    PROCEDURES  Procedures LACERATION REPAIR Performed by: Karen Chafe Denelle Capurro Authorized by: Karen Chafe Melaine Mcphee Consent: Verbal consent obtained. Risks and benefits: risks, benefits and alternatives were discussed Consent given by: patient Patient identity confirmed: provided demographic data Prepped and Draped in  normal sterile fashion Wound explored  Laceration Location: Right parietal scalp  Laceration Length: 0.5 cm  No Foreign Bodies seen or palpated  Anesthesia: None  Irrigation method: syringe Amount of cleaning: standard  Skin closure: Staples  Number of sutures: 1  Patient tolerance: Patient tolerated the procedure well with no immediate complications.   ED DIAGNOSES     ICD-10-CM   1. Fall from slip, trip, or stumble, initial encounter  W01.0XXA     2. Scalp hematoma, initial encounter  S00.03XA     3. Scalp laceration, initial encounter  S01.01XA     4. Skin tear of right elbow without complication, initial encounter  S51.011A     5. Contusion of right elbow, initial encounter  S50.01XA     6. Abrasion, right ankle, initial encounter  D62.229N          Shanon Rosser, MD 11/09/22 [phone]

## 2022-11-13 ENCOUNTER — Encounter (HOSPITAL_COMMUNITY): Payer: Self-pay

## 2022-11-13 ENCOUNTER — Encounter (HOSPITAL_COMMUNITY)
Admission: RE | Admit: 2022-11-13 | Discharge: 2022-11-13 | Disposition: A | Discharge status: Home / Self Care | Payer: Medicare HMO | Source: Ambulatory Visit | Attending: Orthopedic Surgery | Admitting: Orthopedic Surgery

## 2022-11-13 ENCOUNTER — Other Ambulatory Visit: Payer: Self-pay

## 2022-11-13 ENCOUNTER — Ambulatory Visit (HOSPITAL_COMMUNITY)
Admission: RE | Admit: 2022-11-13 | Discharge: 2022-11-13 | Disposition: A | Discharge status: Home / Self Care | Payer: Medicare HMO | Source: Ambulatory Visit | Attending: Orthopedic Surgery | Admitting: Orthopedic Surgery

## 2022-11-13 ENCOUNTER — Ambulatory Visit: Payer: Medicare HMO | Admitting: Sports Medicine

## 2022-11-13 VITALS — BP 120/69 | HR 93 | Temp 98.6°F | Ht 65.0 in | Wt 136.0 lb

## 2022-11-13 DIAGNOSIS — Z01818 Encounter for other preprocedural examination: Secondary | ICD-10-CM | POA: Diagnosis not present

## 2022-11-13 DIAGNOSIS — R0789 Other chest pain: Secondary | ICD-10-CM | POA: Insufficient documentation

## 2022-11-13 HISTORY — DX: Essential (primary) hypertension: I10

## 2022-11-13 HISTORY — DX: Dyspnea, unspecified: R06.00

## 2022-11-13 LAB — CBC
HCT: 35.4 % — ABNORMAL LOW (ref 36.0–46.0)
Hemoglobin: 11.2 g/dL — ABNORMAL LOW (ref 12.0–15.0)
MCH: 32.6 pg (ref 26.0–34.0)
MCHC: 31.6 g/dL (ref 30.0–36.0)
MCV: 102.9 fL — ABNORMAL HIGH (ref 80.0–100.0)
Platelets: 284 10*3/uL (ref 150–400)
RBC: 3.44 MIL/uL — ABNORMAL LOW (ref 3.87–5.11)
RDW: 16.9 % — ABNORMAL HIGH (ref 11.5–15.5)
WBC: 8.5 10*3/uL (ref 4.0–10.5)
nRBC: 0 % (ref 0.0–0.2)

## 2022-11-13 LAB — SURGICAL PCR SCREEN
MRSA, PCR: NEGATIVE
Staphylococcus aureus: POSITIVE — AB

## 2022-11-13 NOTE — Progress Notes (Addendum)
For Short Stay: [place] appointment date:  Bowel Prep reminder:   For Anesthesia: PCP - Dr. Penni Homans Cardiologist - Dr. Dorris Carnes. LOV: 10/22/22  Chest x-ray -  EKG - 10/22/22 Stress Test -  ECHO - 06/22/20 Cardiac Cath -  Pacemaker/ICD device last checked: Pacemaker orders received: Device Rep notified:  Spinal Cord Stimulator:  Sleep Study -  CPAP -   Fasting Blood Sugar -  Checks Blood Sugar _____ times a day Date and result of last Hgb A1c-  Last dose of GLP1 agonist-  GLP1 instructions:   Last dose of SGLT-2 inhibitors-  SGLT-2 instructions:   Blood Thinner Instructions: Aspirin Instructions:  Last Dose:  Activity level: Can go up a flight of stairs and activities of daily living without stopping and without chest pain and/or shortness of breath   unable to exercise at this moment due to hip pain and discomfort.Pt. presently felt coming out of MD. Office,was checked at the urgent center,she has an hematoma with staples on her head,pain on her right elbow.RN advised pt. To notify surgeon as soon as possible about this incident.      Anesthesia review: Hx: COPD,HTN,Palpitations.  Patient denies shortness of breath, fever, cough and chest pain at PAT appointment   Patient verbalized understanding of instructions that were given to them at the PAT appointment. Patient was also instructed that they will need to review over the PAT instructions again at home before surgery.

## 2022-11-14 ENCOUNTER — Encounter (HOSPITAL_COMMUNITY): Payer: Self-pay | Admitting: Physician Assistant

## 2022-11-14 NOTE — Progress Notes (Signed)
PCR: + STAPH °

## 2022-11-14 NOTE — Progress Notes (Signed)
Anesthesia Chart Review   Case: [id number] Date/Time: 11/19/22 0715   Procedure: TOTAL HIP ARTHROPLASTY ANTERIOR APPROACH (Right: Hip)   Anesthesia type: Spinal   Pre-op diagnosis: RIGHT ANTERIOR TOTAL HIP ARTHROPLASTY   [facility]   Surgeons: Dorna Leitz, MD       DISCUSSION:82 y.o. former smoker with h/o HTN, COPD, orthostatic intolerance, carotid stenosis, breast CA stage IV, right hip OA scheduled for above procedure 11/19/2022 with Dr. Dorna Leitz.   Pt last seen by cardiology 10/22/2022. Per OV note, "Preoperative cardiac evaluation: The patient is doing well from a cardiac perspective. She is limited by orthopedic issues, but is able to achieve > 4 METS activity without any concerning symptoms. Therefore, based on ACC/AHA guidelines, the patient would be at acceptable risk for the planned procedure without further cardiovascular testing. According to the Revised Cardiac Risk Index (RCRI), her Perioperative Risk of Major Cardiac Event is (%): 0.9. Her Functional Capacity in METs is: 5.07 according to the Duke Activity Status Index (DASI). From a cardiac perspective, she may hold aspirin for her procedure."  Anticipate pt can proceed with planned procedure barring acute status change.   VS: There were no vitals taken for this visit.  PROVIDERS: [name], MD is PCP   Cardiologist - Dr. Dorris Carnes  LABS: Labs reviewed: Acceptable for surgery. (all labs ordered are listed, but only abnormal results are displayed)  Labs Reviewed - No data to display   IMAGES:   EKG:   CV: Echo 06/22/2020 1. Left ventricular ejection fraction, by estimation, is 60 to 65%. The  left ventricle has normal function. The left ventricle has no regional  wall motion abnormalities. Left ventricular diastolic parameters are  consistent with Grade I diastolic  dysfunction (impaired relaxation). The average left ventricular global  longitudinal strain is -19.8 %. The global  longitudinal strain is normal.   2. Right ventricular systolic function is normal. The right ventricular  size is normal.   3. The mitral valve is normal in structure. Trivial mitral valve  regurgitation. No evidence of mitral stenosis.   4. The aortic valve is normal in structure. Aortic valve regurgitation is  not visualized. No aortic stenosis is present.   5. The inferior vena cava is normal in size with greater than 50%  respiratory variability, suggesting right atrial pressure of 3 mmHg.  Past Medical History:  Diagnosis Date   ALKALINE PHOSPHATASE, ELEVATED 03/15/2009   Allergic state 06/10/2012   Allergy    Anemia    Anxiety and depression 04/28/2011   Arthritis    Asthma    Atypical chest pain 11/30/2011   AVM (arteriovenous malformation) of colon 2011   cecum   Baker's cyst of knee 05/22/2011   Cancer ([place]) 11/2006   XRT/chemo 01-02/ lobular invasive ca   Carotid artery disease ([place])    a. Carotid duplex 03/2014: stable 1-39% BICA, f/u due 03/2016.   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     Clotting disorder (H[facility])    COPD (chronic obstructive pulmonary disease) ([place])    Dementia ([place])    Dermatitis 11/23/2012   Dyspnea    EE (eosinophilic esophagitis)    Emphysema of lung ([place])    Esophageal ring    ESOPHAGEAL STRICTURE 03/29/2009   Fall 11/23/2012   Family history of breast cancer    Family history of colon cancer    Family history  of ovarian cancer    Family history of pancreatic cancer    Folliculitis of nose 77/82/4235   GERD (gastroesophageal reflux disease) 09/29/2009   improved s/p cholecystectomy and esophagus dilatation   History of chicken pox    History of measles    History of shingles    2 episodes   Hx of echocardiogram    a. Echo 01/2013: mild LVH, EF 55-60%, normal wall motion, Gr 1 diast dysfn   Hyperlipidemia    Hypertension    Knee pain, bilateral 07/23/2011    Medicare annual wellness visit, subsequent 06/19/2015   Mixed hyperlipidemia 10/17/2010   Qualifier: Diagnosis of  By: Mack Guise     Orthostasis    Osteopenia 03/14/2011   Osteoporosis    Personal history of chemotherapy 2001   Personal history of radiation therapy 2001   rt breast   PERSONAL HX BREAST CANCER 09/29/2009   Pneumonia    PVC's (premature ventricular contractions)    a. Event monitor 01/2013: NSR, extensive PVCs.   Radial neck fracture 10/2011   minimally displaced   Substance abuse ([place])    In remission 3 years.    Urinary incontinence 03/19/2012   Vaginitis 05/22/2011    Past Surgical History:  Procedure Laterality Date   APPENDECTOMY     AUGMENTATION MAMMAPLASTY Right 05/27/2007   BREAST BIOPSY Right 01/02/2007   wire loc   BREAST BIOPSY  12/26/2006   BREAST LUMPECTOMY Right 2001   BREAST RECONSTRUCTION  2008, 2009, 2010   BREAST REDUCTION WITH MASTOPEXY Left 05/30/2017   Procedure: LEFT BREAST REDUCTION FOR SYMTERY WITH MASTOPEXY;  Surgeon: Wallace Going, DO;  Location: [facility];  Service: Plastics;  Laterality: Left;   CHOLECYSTECTOMY  2010   COLONOSCOPY  09/05/10   [facility]   DENTAL SURGERY  05/2016   4 dental implants by Dr. Loyal Gambler.   ERCP  2010    CBD stone extraction    ESOPHAGOGASTRODUODENOSCOPY  01/08/2012   Procedure: ESOPHAGOGASTRODUODENOSCOPY (EGD);  Surgeon: Gatha Mayer, MD;  Location: [facility];  Service: Endoscopy;  Laterality: N/A;   ESOPHAGOGASTRODUODENOSCOPY (EGD) WITH ESOPHAGEAL DILATION  2010, 2012   LAPAROSCOPIC APPENDECTOMY N/A 08/04/2016   Procedure: APPENDECTOMY LAPAROSCOPIC;  Surgeon: Michael Boston, MD;  [facility];  Service: General;  Laterality: N/A;   LIPOSUCTION WITH LIPOFILLING Left 11/21/2017   Procedure: LIPOSUCTION FROM ABDOMEN WITH LIPOFILLING TO LEFT BREAST;  Surgeon: Wallace Going, DO;  Location: [facility];  Service: Plastics;  Laterality: Left;   MASTECTOMY  MODIFIED RADICAL Right 05/27/2007   , Mastectomy modified radical (08), breast reconstruction, CA lesions excised lateral abd wall 2010   MASTOPEXY Left 11/21/2017   Procedure: LEFT BREAST REVISION MASTOPEXY FOR SYMMETRY;  Surgeon: Wallace Going, DO;  Location: [facility];  Service: Plastics;  Laterality: Left;   REDUCTION MAMMAPLASTY Left    SAVORY DILATION  01/08/2012   Procedure: SAVORY DILATION;  Surgeon: Gatha Mayer, MD;  [facility];  Service: Endoscopy;  Laterality: N/A;  need xray    MEDICATIONS: No current facility-administered medications for this encounter.    acetaminophen (TYLENOL) 500 MG tablet   amitriptyline (ELAVIL) 25 MG tablet   aspirin 81 MG EC tablet   atorvastatin (LIPITOR) 20 MG tablet   Biotin 5 MG CAPS   Brimonidine Tartrate (LUMIFY) 0.025 % SOLN   capecitabine (XELODA) 150 MG tablet   Cholecalciferol (VITAMIN D3) 1000 UNITS CAPS   Cyanocobalamin (VITAMIN B 12 PO)  cyclobenzaprine (FLEXERIL) 10 MG tablet   diclofenac Sodium (VOLTAREN ARTHRITIS PAIN) 1 % GEL   diltiazem (CARDIZEM) 30 MG tablet   FLUoxetine (PROZAC) 40 MG capsule   Folic Acid (FOLATE PO)   LORazepam (ATIVAN) 1 MG tablet   pantoprazole (PROTONIX) 40 MG tablet   Pyridoxine HCl (B-6 PO)   Thiamine HCl (B-1 PO)   influenza vaccine adjuvanted (FLUAD QUADRIVALENT) 0.5 ML injection   loratadine (CLARITIN) 10 MG tablet   pregabalin (LYRICA) 25 MG capsule     [facility] Ward, PA-C [facility] [phone]

## 2022-11-15 ENCOUNTER — Other Ambulatory Visit: Payer: Self-pay | Admitting: Family Medicine

## 2022-11-15 ENCOUNTER — Telehealth: Payer: Self-pay | Admitting: Family Medicine

## 2022-11-15 ENCOUNTER — Ambulatory Visit: Payer: Medicare HMO | Admitting: Sports Medicine

## 2022-11-15 DIAGNOSIS — R413 Other amnesia: Secondary | ICD-10-CM

## 2022-11-15 DIAGNOSIS — M1611 Unilateral primary osteoarthritis, right hip: Secondary | ICD-10-CM | POA: Diagnosis not present

## 2022-11-15 NOTE — Telephone Encounter (Signed)
Patient's husband called to advise that patient had a fall last Thursday and she had a MRI done of her head and the laceration but the orthopaedic surgeon recommended patient see Dr. Charlett Blake to follow up and go over everything and see what she thinks. Husband wasn't sure if the 11th was soon enough. Please call patient or her husband to advise what to do next.

## 2022-11-16 ENCOUNTER — Encounter: Payer: Self-pay | Admitting: Psychology

## 2022-11-16 ENCOUNTER — Ambulatory Visit (INDEPENDENT_AMBULATORY_CARE_PROVIDER_SITE_OTHER): Payer: Medicare HMO

## 2022-11-16 ENCOUNTER — Ambulatory Visit (INDEPENDENT_AMBULATORY_CARE_PROVIDER_SITE_OTHER): Payer: Medicare HMO | Admitting: Sports Medicine

## 2022-11-16 DIAGNOSIS — M1711 Unilateral primary osteoarthritis, right knee: Secondary | ICD-10-CM | POA: Diagnosis not present

## 2022-11-16 DIAGNOSIS — S0101XD Laceration without foreign body of scalp, subsequent encounter: Secondary | ICD-10-CM

## 2022-11-16 DIAGNOSIS — S0101XA Laceration without foreign body of scalp, initial encounter: Secondary | ICD-10-CM | POA: Insufficient documentation

## 2022-11-16 MED ORDER — HYALURONAN 30 MG/2ML IX SOSY
30.0000 mg | PREFILLED_SYRINGE | Freq: Once | INTRA_ARTICULAR | Status: AC
  Administered 2022-11-16: 30 mg via INTRA_ARTICULAR

## 2022-11-16 NOTE — Assessment & Plan Note (Signed)
Orthovisc 4 of 4 right knee, return in 6 weeks as needed.

## 2022-11-16 NOTE — Progress Notes (Signed)
    Procedures performed today:    Procedure: Real-time Ultrasound Guided injection of the right knee Device: Samsung HS60  Verbal informed consent obtained.  Time-out conducted.  Noted no overlying erythema, induration, or other signs of local infection.  Skin prepped in a sterile fashion.  Local anesthesia: Topical Ethyl chloride.  With sterile technique and under real time ultrasound guidance: Trace effusion, 30 mg/2 mL of OrthoVisc (sodium hyaluronate) in a prefilled syringe was injected easily into the knee through a 22-gauge needle. Completed without difficulty  Advised to call if fevers/chills, erythema, induration, drainage, or persistent bleeding.  Images permanently stored and available for review in PACS.  Impression: Technically successful ultrasound guided injection.  Independent interpretation of notes and tests performed by another provider:   None.  Brief History, Exam, Impression, and Recommendations:    Primary osteoarthritis of right knee Orthovisc 4 of 4 right knee, return in 6 weeks as needed.  Scalp laceration This pleasant 82 year old female unfortunately had a fall approximately 11 days ago, she was seen in the emergency department, a scalp laceration was repaired with a single staple, she is doing well, I remove the single staple today, the wound is clean, dry, intact. She does not typically walk with her cane, but I advised better to have it and not need it than need it and not have it.    ____________________________________________ Gwen Her. Dianah Field, M.D., ABFM., CAQSM., AME. Primary Care and Sports Medicine [facility]  Adjunct Professor of [employer] [facility] [employer]  Risk manager

## 2022-11-16 NOTE — Assessment & Plan Note (Addendum)
This pleasant 82 year old female unfortunately had a fall approximately 11 days ago, she was seen in the emergency department, a scalp laceration was repaired with a single staple, she is doing well, I remove the single staple today, the wound is clean, dry, intact. She does not typically walk with her cane, but I advised better to have it and not need it than need it and not have it.

## 2022-11-19 ENCOUNTER — Encounter (HOSPITAL_COMMUNITY): Admission: RE | Payer: Self-pay | Source: Ambulatory Visit

## 2022-11-19 ENCOUNTER — Ambulatory Visit (HOSPITAL_COMMUNITY): Admission: RE | Admit: 2022-11-19 | Payer: Medicare HMO | Source: Ambulatory Visit | Admitting: Orthopedic Surgery

## 2022-11-19 LAB — TYPE AND SCREEN
ABO/RH(D): O POS
Antibody Screen: NEGATIVE

## 2022-11-19 SURGERY — ARTHROPLASTY, HIP, TOTAL, ANTERIOR APPROACH
Anesthesia: Spinal | Site: Hip | Laterality: Right

## 2022-11-19 NOTE — Telephone Encounter (Signed)
Called pt lvm of Dr.[name] is out office until 11/22/22 and  We will see you guys then but if its something urgent will need to see someone else.

## 2022-11-21 ENCOUNTER — Ambulatory Visit: Payer: Medicare HMO | Admitting: Sports Medicine

## 2022-11-22 ENCOUNTER — Ambulatory Visit (INDEPENDENT_AMBULATORY_CARE_PROVIDER_SITE_OTHER): Payer: Medicare HMO | Admitting: Family Medicine

## 2022-11-22 VITALS — BP 110/70 | HR 104 | Temp 97.5°F | Resp 16 | Ht 65.0 in | Wt 137.6 lb

## 2022-11-22 DIAGNOSIS — R739 Hyperglycemia, unspecified: Secondary | ICD-10-CM | POA: Diagnosis not present

## 2022-11-22 DIAGNOSIS — C50911 Malignant neoplasm of unspecified site of right female breast: Secondary | ICD-10-CM

## 2022-11-22 DIAGNOSIS — J4520 Mild intermittent asthma, uncomplicated: Secondary | ICD-10-CM

## 2022-11-22 DIAGNOSIS — H93A9 Pulsatile tinnitus, unspecified ear: Secondary | ICD-10-CM | POA: Diagnosis not present

## 2022-11-22 DIAGNOSIS — G629 Polyneuropathy, unspecified: Secondary | ICD-10-CM | POA: Diagnosis not present

## 2022-11-22 DIAGNOSIS — R296 Repeated falls: Secondary | ICD-10-CM

## 2022-11-22 DIAGNOSIS — S0101XA Laceration without foreign body of scalp, initial encounter: Secondary | ICD-10-CM | POA: Diagnosis not present

## 2022-11-22 DIAGNOSIS — W19XXXS Unspecified fall, sequela: Secondary | ICD-10-CM | POA: Diagnosis not present

## 2022-11-22 DIAGNOSIS — C792 Secondary malignant neoplasm of skin: Secondary | ICD-10-CM | POA: Diagnosis not present
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
## 2022-11-22 NOTE — Patient Instructions (Signed)
Mnire's Disease  Mnire's disease is an inner ear disorder. It causes recurrent attacks of a spinning sensation (vertigo), and ringing in the ear (tinnitus). It also causes hearing loss and a feeling of fullness or pressure in the ear. It typically affects one ear but can affect both. This is a lifelong condition, and it may get worse over time. There are treatment options to help manage the symptoms of Mnire's disease. What are the causes? This condition is caused by having too much of the fluid that is in the inner ear (endolymph). When fluid builds up in the inner ear, it affects the nerves that control balance and hearing. The reason for the fluid buildup is not known. Possible causes include: Allergies. An abnormal reaction of the body's defense system (autoimmune disease). Viral infection of the inner ear. Head injury. What increases the risk? The following factors may make you more likely to develop this condition: Being between 18 and 71 years old. Having a family history of Mnire's disease. Having a history of autoimmune disease. Having an injury to the head (head trauma). What are the signs or symptoms? Symptoms of this condition include: Fullness and pressure in your ear. Roaring or ringing in your ear (tinnitus). Vertigo and loss of balance. Decreased hearing. Nausea and vomiting. This is rare. Symptoms of this condition can come and go and may last for up to 4 or more hours at a time. Symptoms usually start in one ear. They may become more frequent and eventually involve both ears. How is this diagnosed? This condition is diagnosed based on a physical exam and tests. Tests may include: A hearing test (audiogram). An electronystagmogram (ENG). This tests your balance nerve (vestibular nerve). Imaging studies of your inner ear and hearing nerve, such as an MRI. Other balance tests, such as rotational or balance platform tests. How is this treated? There is no cure  for this condition, but treatment can help to manage your symptoms. Treatment may include: A low-salt (low-sodium) diet. This may help to reduce fluid in the body and relieve symptoms. Oral or injected medicines to reduce or control: Vertigo. Nausea. Fluid retention. Use of an air pressure pulse generator. This is a machine that sends small pressure pulses into your ear canal. Injections through the ear drum (tympanic membrane) to control vertigo symptoms. Hearing aids. Inner ear surgery. This is rare. Follow these instructions at home: Eating and drinking Avoid caffeine. Do not drink alcohol. Drink enough fluid to keep your urine pale yellow. Limit the sodium in your diet as told by your health care provider. This is usually no more than 1,500-2,000 mg per day. Check ingredients and nutrition facts on packaged foods and beverages. General instructions Take over-the-counter and prescription medicines only as told by your health care provider. Do not drive if you have vertigo or dizziness. Do not use any products that contain nicotine or tobacco. These products include cigarettes, chewing tobacco, and vaping devices, such as e-cigarettes. If you need help quitting, ask your health care provider. Keep all follow-up visits. This is important. Where to find more information To find more information, advice, and guidance, please see these online sites: American Academy of Otolaryngology-Head and Neck Surgery Foundation: www.enthealth.[place] on Deafness and Other Communication Disorders: FightListings.se [employer]: www.american-hearing.org Contact a health care provider if: You have symptoms that last longer than 4 hours. You have new or worse symptoms. Get help right away if: You have been vomiting for 24 hours. You  cannot keep fluids down. You have chest pain or trouble breathing. These symptoms may represent a serious problem that is an  emergency. Do not wait to see if the symptoms will go away. Get medical help right away. Call your local emergency services (911 in the [place] Do not drive yourself to the hospital. Summary Mnire's disease is an inner ear disorder. This condition causes recurrent attacks of a spinning sensation (vertigo), ringing in the ear (tinnitus), hearing loss, and ear fullness. Symptoms of this condition can come and go and may last for up to 4 or more hours at a time. Mnire's disease can be treated with a low-sodium diet, medicines, and sometimes, surgery. This information is not intended to replace advice given to you by your health care provider. Make sure you discuss any questions you have with your health care provider. Document Revised: 10/03/2020 Document Reviewed: 10/03/2020 Elsevier Patient Education  [place]. Tinnitus Tinnitus refers to hearing a sound when there is no actual source for that sound. This is often described as ringing in the ears. However, people with this condition may hear a variety of noises, in one ear or in both ears. The sounds of tinnitus can be soft, loud, or somewhere in between. Tinnitus can last for a few seconds or can be constant for days. It may go away without treatment and come back at various times. When tinnitus is constant or happens often, it can lead to other problems, such as trouble sleeping and trouble concentrating. Almost everyone experiences tinnitus at some point. Tinnitus is not the same as hearing loss. Tinnitus that is long-lasting (chronic) or comes back often (recurs) may require medical attention. What are the causes? The cause of tinnitus is often not known. In some cases, it can result from: Exposure to loud noises from machinery, music, or other sources. An object (foreign body) stuck in the ear. Earwax buildup. Drinking alcohol or caffeine. Taking certain medicines. Age-related hearing loss. It may also be caused by medical  conditions such as: Ear or sinus infections. Heart diseases or high blood pressure. Allergies. Mnire's disease. Thyroid problems. Tumors. A weak, bulging blood vessel (aneurysm) near the ear. What increases the risk? The following factors may make you more likely to develop this condition: Exposure to loud noises. Age. Tinnitus is more likely in older individuals. Using alcohol or tobacco. What are the signs or symptoms? The main symptom of tinnitus is hearing a sound when there is no source for that sound. It may sound like: Buzzing. Sizzling. Ringing. Blowing air. Hissing. Whistling. Other sounds may include: Roaring. Running water. A musical note. Tapping. Humming. Symptoms may affect only one ear (unilateral) or both ears (bilateral). How is this diagnosed? Tinnitus is diagnosed based on your symptoms, your medical history, and a physical exam. Your health care provider may do a thorough hearing test (audiologic exam) if your tinnitus: Is unilateral. Causes hearing difficulties. Lasts 6 months or longer. You may work with a health care provider who specializes in hearing disorders (audiologist). You may be asked questions about your symptoms and how they affect your daily life. You may have other tests done, such as: CT scan. MRI. An imaging test of how blood flows through your blood vessels (angiogram). How is this treated? Treating an underlying medical condition can sometimes make tinnitus go away. If your tinnitus continues, other treatments may include: Therapy and counseling to help you manage the stress of living with tinnitus. Sound generators to mask the tinnitus. These  include: Tabletop sound machines that play relaxing sounds to help you fall asleep. Wearable devices that fit in your ear and play sounds or music. Acoustic neural stimulation. This involves using headphones to listen to music that contains an auditory signal. Over time, listening to this  signal may change some pathways in your brain and make you less sensitive to tinnitus. This treatment is used for very severe cases when no other treatment is working. Using hearing aids or cochlear implants if your tinnitus is related to hearing loss. Hearing aids are worn in the outer ear. Cochlear implants are surgically placed in the inner ear. Follow these instructions at home: Managing symptoms     When possible, avoid being in loud places and being exposed to loud sounds. Wear hearing protection, such as earplugs, when you are exposed to loud noises. Use a white noise machine, a humidifier, or other devices to mask the sound of tinnitus. Practice techniques for reducing stress, such as meditation, yoga, or deep breathing. Work with your health care provider if you need help with managing stress. Sleep with your head slightly raised. This may reduce the impact of tinnitus. General instructions Do not use stimulants, such as nicotine, alcohol, or caffeine. Talk with your health care provider about other stimulants to avoid. Stimulants are substances that can make you feel alert and attentive by increasing certain activities in the body (such as heart rate and blood pressure). These substances may make tinnitus worse. Take over-the-counter and prescription medicines only as told by your health care provider. Try to get plenty of sleep each night. Keep all follow-up visits. This is important. Contact a health care provider if: Your tinnitus continues for 3 weeks or longer without stopping. You develop sudden hearing loss. Your symptoms get worse or do not get better with home care. You feel you are not able to manage the stress of living with tinnitus. Get help right away if: You develop tinnitus after a head injury. You have tinnitus along with any of the following: Dizziness. Nausea and vomiting. Loss of balance. Sudden, severe headache. Vision changes. Facial weakness or weakness  of arms or legs. These symptoms may represent a serious problem that is an emergency. Do not wait to see if the symptoms will go away. Get medical help right away. Call your local emergency services (911 in the [place] Do not drive yourself to the hospital. Summary Tinnitus refers to hearing a sound when there is no actual source for that sound. This is often described as ringing in the ears. Symptoms may affect only one ear (unilateral) or both ears (bilateral). Use a white noise machine, a humidifier, or other devices to mask the sound of tinnitus. Do not use stimulants, such as nicotine, alcohol, or caffeine. These substances may make tinnitus worse. This information is not intended to replace advice given to you by your health care provider. Make sure you discuss any questions you have with your health care provider. Document Revised: 10/03/2020 Document Reviewed: 10/03/2020 Elsevier Patient Education  [place].

## 2022-11-23 ENCOUNTER — Telehealth (HOSPITAL_BASED_OUTPATIENT_CLINIC_OR_DEPARTMENT_OTHER): Payer: Self-pay

## 2022-11-23 DIAGNOSIS — G629 Polyneuropathy, unspecified: Secondary | ICD-10-CM | POA: Insufficient documentation

## 2022-11-23 NOTE — Assessment & Plan Note (Signed)
hgba1c acceptable, minimize simple carbs. Increase exercise as tolerated.  

## 2022-11-23 NOTE — Assessment & Plan Note (Signed)
No recent exacerbation, no changes

## 2022-11-23 NOTE — Assessment & Plan Note (Signed)
Tripped recently and suffered a scalp laceration requiring one staple that has healed well and her CT head that day was negative for any acute concern but now she has developed some pulsatile tinnitus and some disequilibrium will proceed with MRA of brain to further evaluate and she will seek care if worsens.

## 2022-11-23 NOTE — Assessment & Plan Note (Signed)
She has started Vitamin B supplementation and symptoms are improved.

## 2022-11-23 NOTE — Assessment & Plan Note (Signed)
She has responded very well to current treatments and feels better, is following with oncology

## 2022-11-30 ENCOUNTER — Other Ambulatory Visit: Payer: Self-pay | Admitting: Internal Medicine

## 2022-12-01 ENCOUNTER — Ambulatory Visit (HOSPITAL_BASED_OUTPATIENT_CLINIC_OR_DEPARTMENT_OTHER)
Admission: RE | Admit: 2022-12-01 | Discharge: 2022-12-01 | Disposition: A | Discharge status: Home / Self Care | Payer: Medicare HMO | Source: Ambulatory Visit | Attending: Family Medicine | Admitting: Family Medicine

## 2022-12-01 DIAGNOSIS — H93A9 Pulsatile tinnitus, unspecified ear: Secondary | ICD-10-CM | POA: Insufficient documentation

## 2022-12-01 DIAGNOSIS — R296 Repeated falls: Secondary | ICD-10-CM | POA: Insufficient documentation

## 2022-12-05 ENCOUNTER — Encounter: Payer: Self-pay | Admitting: Family Medicine

## 2022-12-10 ENCOUNTER — Telehealth: Payer: Self-pay

## 2022-12-10 ENCOUNTER — Other Ambulatory Visit: Payer: Self-pay | Admitting: Family Medicine

## 2022-12-10 DIAGNOSIS — G4700 Insomnia, unspecified: Secondary | ICD-10-CM

## 2022-12-10 DIAGNOSIS — F419 Anxiety disorder, unspecified: Secondary | ICD-10-CM

## 2022-12-10 NOTE — Telephone Encounter (Signed)
Tried initiating PA via Covermymeds. Plan requires via telephone. Called [phone], spoke w/ Caryl Pina, [place] initiated, spoke w/ Minette Brine, pharmacist. Pt has not tried and failed 2 SSRI or SNRI's (only fluoxetine, they do not count Cymbalta). I have withdrawn PA at this time. Please advise.

## 2022-12-11 NOTE — Telephone Encounter (Signed)
Appt w/ Tammy on 12/13/22.

## 2022-12-12 DIAGNOSIS — C792 Secondary malignant neoplasm of skin: Secondary | ICD-10-CM | POA: Diagnosis not present

## 2022-12-12 DIAGNOSIS — G893 Neoplasm related pain (acute) (chronic): Secondary | ICD-10-CM | POA: Diagnosis not present

## 2022-12-12 DIAGNOSIS — C50911 Malignant neoplasm of unspecified site of right female breast: Secondary | ICD-10-CM | POA: Diagnosis not present

## 2022-12-13 ENCOUNTER — Telehealth: Payer: Self-pay | Admitting: Pharmacist

## 2022-12-13 ENCOUNTER — Telehealth: Payer: Medicare HMO

## 2022-12-13 NOTE — Telephone Encounter (Signed)
Patient was referred to Clinical Pharmacist Practitioner for medication management and assist with cost of medications.  First attempt today to outreach patient was unsuccessful.  LM on patient's VM with my CB# [phone] or [phone]

## 2022-12-17 ENCOUNTER — Telehealth: Payer: Self-pay | Admitting: Pharmacist

## 2022-12-17 ENCOUNTER — Ambulatory Visit: Payer: Medicare HMO | Admitting: Sports Medicine

## 2022-12-17 ENCOUNTER — Telehealth: Payer: Medicare HMO

## 2022-12-17 NOTE — Telephone Encounter (Signed)
Second scheduled phone visit with Clinical Pharmacist Practitioner to review medications and assist with cost / coverage per referral from PCP. No answer / LM on VM.

## 2022-12-20 ENCOUNTER — Telehealth: Payer: Self-pay

## 2022-12-20 NOTE — Progress Notes (Signed)
   Care Guide Note  12/20/2022 Name: DEVOTA VIRUET MRN: [id number] DOB: 1940/11/19  Referred by: [name], MD Reason for referral : Care Coordination (Outreach to schedule with pharm d )   ROGELIO WINBUSH is a 82 y.o. year old female who is a primary care patient of [name], MD. Gwenevere Ghazi was referred to the pharmacist for assistance related to DM.    A third unsuccessful telephone outreach was attempted today to contact the patient who was referred to the pharmacy team for assistance with medication assistance. The Population Health team is pleased to engage with this patient at any time in the future upon receipt of referral and should he/she be interested in assistance from the [facility] team.    Noreene Larsson, [place], [place] [address] Direct Dial: [phone] [email or web address]

## 2022-12-29 ENCOUNTER — Encounter: Payer: Self-pay | Admitting: Family Medicine

## 2022-12-31 ENCOUNTER — Telehealth: Payer: Self-pay

## 2022-12-31 NOTE — Telephone Encounter (Signed)
PA approved. Effective 11/12/22 to 11/12/23.

## 2022-12-31 NOTE — Telephone Encounter (Signed)
PA initiated via Covermymeds; KEY: [id number]. Awaiting determination.

## 2023-01-09 ENCOUNTER — Other Ambulatory Visit (HOSPITAL_BASED_OUTPATIENT_CLINIC_OR_DEPARTMENT_OTHER): Payer: Self-pay

## 2023-01-09 MED ORDER — COMIRNATY 30 MCG/0.3ML IM SUSY
PREFILLED_SYRINGE | INTRAMUSCULAR | 0 refills | Status: DC
  Filled 2023-01-09: qty 0.3, 1d supply, fill #0

## 2023-01-15 ENCOUNTER — Encounter: Payer: Self-pay | Admitting: Family Medicine

## 2023-01-15 ENCOUNTER — Other Ambulatory Visit: Payer: Self-pay | Admitting: Family Medicine

## 2023-01-15 DIAGNOSIS — S299XXA Unspecified injury of thorax, initial encounter: Secondary | ICD-10-CM

## 2023-01-16 ENCOUNTER — Other Ambulatory Visit: Payer: Self-pay | Admitting: Family Medicine

## 2023-01-16 DIAGNOSIS — S299XXA Unspecified injury of thorax, initial encounter: Secondary | ICD-10-CM

## 2023-01-16 MED ORDER — LORAZEPAM 1 MG PO TABS: ORAL_TABLET | ORAL | 2 refills | Status: DC

## 2023-01-30 DIAGNOSIS — Z79899 Other long term (current) drug therapy: Secondary | ICD-10-CM | POA: Diagnosis not present

## 2023-01-30 DIAGNOSIS — C792 Secondary malignant neoplasm of skin: Secondary | ICD-10-CM | POA: Diagnosis not present

## 2023-01-30 DIAGNOSIS — R932 Abnormal findings on diagnostic imaging of liver and biliary tract: Secondary | ICD-10-CM | POA: Diagnosis not present

## 2023-01-30 DIAGNOSIS — Z08 Encounter for follow-up examination after completed treatment for malignant neoplasm: Secondary | ICD-10-CM | POA: Diagnosis not present

## 2023-01-30 DIAGNOSIS — C50911 Malignant neoplasm of unspecified site of right female breast: Secondary | ICD-10-CM | POA: Diagnosis not present

## 2023-01-30 DIAGNOSIS — M8580 Other specified disorders of bone density and structure, unspecified site: Secondary | ICD-10-CM | POA: Diagnosis not present

## 2023-01-30 DIAGNOSIS — C50919 Malignant neoplasm of unspecified site of unspecified female breast: Secondary | ICD-10-CM | POA: Diagnosis not present

## 2023-01-30 DIAGNOSIS — Z853 Personal history of malignant neoplasm of breast: Secondary | ICD-10-CM | POA: Diagnosis not present

## 2023-01-30 DIAGNOSIS — J9811 Atelectasis: Secondary | ICD-10-CM | POA: Diagnosis not present

## 2023-01-30 DIAGNOSIS — N959 Unspecified menopausal and perimenopausal disorder: Secondary | ICD-10-CM | POA: Diagnosis not present

## 2023-02-02 ENCOUNTER — Encounter (INDEPENDENT_AMBULATORY_CARE_PROVIDER_SITE_OTHER): Payer: Medicare HMO | Admitting: Sports Medicine

## 2023-02-02 DIAGNOSIS — S32401A Unspecified fracture of right acetabulum, initial encounter for closed fracture: Secondary | ICD-10-CM | POA: Diagnosis not present

## 2023-02-04 DIAGNOSIS — S32401A Unspecified fracture of right acetabulum, initial encounter for closed fracture: Secondary | ICD-10-CM | POA: Insufficient documentation

## 2023-02-04 NOTE — Telephone Encounter (Signed)
I spent 5 total minutes of online digital evaluation and management services in this patient-initiated request for online care. 

## 2023-02-05 NOTE — Telephone Encounter (Signed)
Added patient to waitlist

## 2023-02-06 DIAGNOSIS — C50911 Malignant neoplasm of unspecified site of right female breast: Secondary | ICD-10-CM | POA: Diagnosis not present

## 2023-02-07 ENCOUNTER — Other Ambulatory Visit: Payer: Self-pay | Admitting: Internal Medicine

## 2023-02-08 ENCOUNTER — Other Ambulatory Visit: Payer: Self-pay | Admitting: Internal Medicine

## 2023-02-12 DIAGNOSIS — H9312 Tinnitus, left ear: Secondary | ICD-10-CM | POA: Diagnosis not present

## 2023-02-12 DIAGNOSIS — H903 Sensorineural hearing loss, bilateral: Secondary | ICD-10-CM | POA: Diagnosis not present

## 2023-02-15 ENCOUNTER — Ambulatory Visit (INDEPENDENT_AMBULATORY_CARE_PROVIDER_SITE_OTHER): Payer: Medicare HMO | Admitting: Sports Medicine

## 2023-02-15 DIAGNOSIS — M160 Bilateral primary osteoarthritis of hip: Secondary | ICD-10-CM

## 2023-02-15 DIAGNOSIS — M8580 Other specified disorders of bone density and structure, unspecified site: Secondary | ICD-10-CM

## 2023-02-15 NOTE — Patient Instructions (Signed)
Mediterranean Diet  Why follow it? Research shows. Those who follow the Mediterranean diet have a reduced risk of heart disease  The diet is associated with a reduced incidence of Parkinson's and Alzheimer's diseases People following the diet may have longer life expectancies and lower rates of chronic diseases  The Dietary Guidelines for Americans recommends the Mediterranean diet as an eating plan to promote health and prevent disease  What Is the Mediterranean Diet?  Healthy eating plan based on typical foods and recipes of Mediterranean-style cooking The diet is primarily a plant based diet; these foods should make up a majority of meals   Starches - Plant based foods should make up a majority of meals - They are an important sources of vitamins, minerals, energy, antioxidants, and fiber - Choose whole grains, foods high in fiber and minimally processed items  - Typical grain sources include wheat, oats, barley, corn, brown rice, bulgar, farro, millet, polenta, couscous  - Various types of beans include chickpeas, lentils, fava beans, black beans, white beans   Fruits  Veggies - Large quantities of antioxidant rich fruits & veggies; 6 or more servings  - Vegetables can be eaten raw or lightly drizzled with oil and cooked  - Vegetables common to the traditional Mediterranean Diet include: artichokes, arugula, beets, broccoli, brussel sprouts, cabbage, carrots, celery, collard greens, cucumbers, eggplant, kale, leeks, lemons, lettuce, mushrooms, okra, onions, peas, peppers, potatoes, pumpkin, radishes, rutabaga, shallots, spinach, sweet potatoes, turnips, zucchini - Fruits common to the Mediterranean Diet include: apples, apricots, avocados, cherries, clementines, dates, figs, grapefruits, grapes, melons, nectarines, oranges, peaches, pears, pomegranates, strawberries, tangerines  Fats - Replace butter and margarine with healthy oils, such as olive oil, canola oil, and tahini  - Limit  nuts to no more than a handful a day  - Nuts include walnuts, almonds, pecans, pistachios, pine nuts  - Limit or avoid candied, honey roasted or heavily salted nuts - Olives are central to the Mediterranean diet - can be eaten whole or used in a variety of dishes   Meats Protein - Limiting red meat: no more than a few times a month - When eating red meat: choose lean cuts and keep the portion to the size of deck of cards - Eggs: approx. 0 to 4 times a week  - Fish and lean poultry: at least 2 a week  - Healthy protein sources include, chicken, turkey, lean beef, lamb - Increase intake of seafood such as tuna, salmon, trout, mackerel, shrimp, scallops - Avoid or limit high fat processed meats such as sausage and bacon  Dairy - Include moderate amounts of low fat dairy products  - Focus on healthy dairy such as fat free yogurt, skim milk, low or reduced fat cheese - Limit dairy products higher in fat such as whole or 2% milk, cheese, ice cream  Alcohol - Moderate amounts of red wine is ok  - No more than 5 oz daily for women (all ages) and men older than age 65  - No more than 10 oz of wine daily for men younger than 65  Other - Limit sweets and other desserts  - Use herbs and spices instead of salt to flavor foods  - Herbs and spices common to the traditional Mediterranean Diet include: basil, bay leaves, chives, cloves, cumin, fennel, garlic, lavender, marjoram, mint, oregano, parsley, pepper, rosemary, sage, savory, sumac, tarragon, thyme   It's not just a diet, it's a lifestyle:  The Mediterranean diet includes lifestyle factors typical of   those in the region  Foods, drinks and meals are best eaten with others and savored Daily physical activity is important for overall good health This could be strenuous exercise like running and aerobics This could also be more leisurely activities such as walking, housework, yard-work, or taking the stairs Moderation is the key; a balanced and healthy  diet accommodates most foods and drinks Consider portion sizes and frequency of consumption of certain foods   Meal Ideas & Options:  Breakfast:  Whole wheat toast or whole wheat English muffins with peanut butter & hard boiled egg Steel cut oats topped with apples & cinnamon and skim milk  Fresh fruit: banana, strawberries, melon, berries, peaches  Smoothies: strawberries, bananas, greek yogurt, peanut butter Low fat greek yogurt with blueberries and granola  Egg white omelet with spinach and mushrooms Breakfast couscous: whole wheat couscous, apricots, skim milk, cranberries  Sandwiches:  Hummus and grilled vegetables (peppers, zucchini, squash) on whole wheat bread   Grilled chicken on whole wheat pita with lettuce, tomatoes, cucumbers or tzatziki  Tuna salad on whole wheat bread: tuna salad made with greek yogurt, olives, red peppers, capers, green onions Garlic rosemary lamb pita: lamb sauted with garlic, rosemary, salt & pepper; add lettuce, cucumber, greek yogurt to pita - flavor with lemon juice and black pepper  Seafood:  Mediterranean grilled salmon, seasoned with garlic, basil, parsley, lemon juice and black pepper Shrimp, lemon, and spinach whole-grain pasta salad made with low fat greek yogurt  Seared scallops with lemon orzo  Seared tuna steaks seasoned salt, pepper, coriander topped with tomato mixture of olives, tomatoes, olive oil, minced garlic, parsley, green onions and cappers  Meats:  Herbed greek chicken salad with kalamata olives, cucumber, feta  Red bell peppers stuffed with spinach, bulgur, lean ground beef (or lentils) & topped with feta   Kebabs: skewers of chicken, tomatoes, onions, zucchini, squash  Turkey burgers: made with red onions, mint, dill, lemon juice, feta cheese topped with roasted red peppers Vegetarian Cucumber salad: cucumbers, artichoke hearts, celery, red onion, feta cheese, tossed in olive oil & lemon juice  Hummus and whole grain pita  points with a greek salad (lettuce, tomato, feta, olives, cucumbers, red onion) Lentil soup with celery, carrots made with vegetable broth, garlic, salt and pepper  Tabouli salad: parsley, bulgur, mint, scallions, cucumbers, tomato, radishes, lemon juice, olive oil, salt and pepper.  

## 2023-02-15 NOTE — Assessment & Plan Note (Signed)
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 I did explain to her that she does have end-stage hip osteoarthritis and the pain would likely come back, but we would continue to do nonoperative measures including home physical therapy, NSAIDs, Mediterranean diet, injections. Return to see me as needed.

## 2023-02-15 NOTE — Assessment & Plan Note (Signed)
Currently getting Prolia injections with oncology at [facility], it sounds like she has gotten zoledronic acid as well in the past.

## 2023-02-15 NOTE — Progress Notes (Signed)
    Procedures performed today:    None.  Independent interpretation of notes and tests performed by another provider:   None.  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 I did explain to her that she does have end-stage hip osteoarthritis and the pain would likely come back, but we would continue to do nonoperative measures including home physical therapy, NSAIDs, Mediterranean diet, injections. Return to see me as needed.   Osteopenia determined by x-ray Currently getting Prolia injections with oncology at [facility], it sounds like she has gotten zoledronic acid as well in the past.    ____________________________________________ Ihor Austin. Benjamin Stain, M.D., ABFM., CAQSM., AME. Primary Care and Sports Medicine [facility]  Adjunct Professor of Family Medicine  [employer]  Restaurant manager, fast food

## 2023-02-16 ENCOUNTER — Encounter: Payer: Self-pay | Admitting: Sports Medicine

## 2023-02-20 DIAGNOSIS — H31091 Other chorioretinal scars, right eye: Secondary | ICD-10-CM | POA: Diagnosis not present

## 2023-02-20 DIAGNOSIS — H353132 Nonexudative age-related macular degeneration, bilateral, intermediate dry stage: Secondary | ICD-10-CM | POA: Diagnosis not present

## 2023-02-20 DIAGNOSIS — H26491 Other secondary cataract, right eye: Secondary | ICD-10-CM | POA: Diagnosis not present

## 2023-02-20 DIAGNOSIS — H04123 Dry eye syndrome of bilateral lacrimal glands: Secondary | ICD-10-CM | POA: Diagnosis not present

## 2023-02-20 DIAGNOSIS — Z961 Presence of intraocular lens: Secondary | ICD-10-CM | POA: Diagnosis not present

## 2023-02-20 DIAGNOSIS — H3581 Retinal edema: Secondary | ICD-10-CM | POA: Diagnosis not present

## 2023-02-20 DIAGNOSIS — H43811 Vitreous degeneration, right eye: Secondary | ICD-10-CM | POA: Diagnosis not present

## 2023-02-25 ENCOUNTER — Other Ambulatory Visit: Payer: Self-pay | Admitting: Family Medicine

## 2023-02-25 DIAGNOSIS — H35711 Central serous chorioretinopathy, right eye: Secondary | ICD-10-CM | POA: Diagnosis not present

## 2023-02-25 DIAGNOSIS — F322 Major depressive disorder, single episode, severe without psychotic features: Secondary | ICD-10-CM

## 2023-02-25 DIAGNOSIS — H353211 Exudative age-related macular degeneration, right eye, with active choroidal neovascularization: Secondary | ICD-10-CM | POA: Diagnosis not present

## 2023-02-28 DIAGNOSIS — H353211 Exudative age-related macular degeneration, right eye, with active choroidal neovascularization: Secondary | ICD-10-CM | POA: Diagnosis not present

## 2023-02-28 DIAGNOSIS — H35711 Central serous chorioretinopathy, right eye: Secondary | ICD-10-CM | POA: Diagnosis not present

## 2023-03-02 DIAGNOSIS — H5711 Ocular pain, right eye: Secondary | ICD-10-CM | POA: Diagnosis not present

## 2023-03-05 ENCOUNTER — Ambulatory Visit: Payer: Medicare HMO

## 2023-03-05 ENCOUNTER — Telehealth: Payer: Self-pay | Admitting: Family Medicine

## 2023-03-05 NOTE — Telephone Encounter (Signed)
Copied from CRM [id number]. Topic: Medicare AWV >> Mar 05, 2023 11:22 AM Payton Doughty wrote: Reason for EAV:[id number] patient to reschedule Medicare Annual Wellness Visit (AWV). Left message for patient to call back and reschedule Medicare Annual Wellness Visit (AWV) cancelled on 03/05/23. NHA went home sick.  Please schedule an appointment at any time with Anne [name], CMA  .  If any questions, please contact me.  Thank you ,  Verlee Rossetti; Care Guide Ambulatory Clinical Support [place] [facility] Direct Dial: [phone]

## 2023-03-05 NOTE — Telephone Encounter (Signed)
Copied from CRM [id number]. Topic: Medicare AWV >> Mar 05, 2023 11:34 AM Payton Doughty wrote: Reason for CRM: Called patient to reschedule Medicare Annual Wellness Visit (AWV). Left message for patient to call back and schedule Medicare Annual Wellness Visit (AWV). Cancelled 03/05/23 appt.  Please call back to rescheduled.    Please schedule an appointment at any time with Donne Anon, CMA  .  If any questions, please contact me.  Thank you ,  Verlee Rossetti; Care Guide Ambulatory Clinical Support [place] [facility] Direct Dial: [phone]

## 2023-03-05 NOTE — Telephone Encounter (Signed)
error 

## 2023-03-13 DIAGNOSIS — C50911 Malignant neoplasm of unspecified site of right female breast: Secondary | ICD-10-CM | POA: Diagnosis not present

## 2023-03-13 DIAGNOSIS — C792 Secondary malignant neoplasm of skin: Secondary | ICD-10-CM | POA: Diagnosis not present

## 2023-03-13 DIAGNOSIS — C772 Secondary and unspecified malignant neoplasm of intra-abdominal lymph nodes: Secondary | ICD-10-CM | POA: Diagnosis not present

## 2023-03-13 DIAGNOSIS — M5410 Radiculopathy, site unspecified: Secondary | ICD-10-CM | POA: Diagnosis not present

## 2023-03-13 DIAGNOSIS — G893 Neoplasm related pain (acute) (chronic): Secondary | ICD-10-CM | POA: Diagnosis not present

## 2023-03-17 DIAGNOSIS — N3000 Acute cystitis without hematuria: Secondary | ICD-10-CM | POA: Diagnosis not present

## 2023-03-17 DIAGNOSIS — R300 Dysuria: Secondary | ICD-10-CM | POA: Diagnosis not present

## 2023-03-18 ENCOUNTER — Ambulatory Visit
Admission: EM | Admit: 2023-03-18 | Discharge: 2023-03-18 | Disposition: A | Discharge status: Home / Self Care | Payer: Medicare HMO | Attending: Emergency Medicine | Admitting: Emergency Medicine

## 2023-03-18 ENCOUNTER — Encounter: Payer: Self-pay | Admitting: Emergency Medicine

## 2023-03-18 DIAGNOSIS — J069 Acute upper respiratory infection, unspecified: Secondary | ICD-10-CM | POA: Diagnosis not present

## 2023-03-18 DIAGNOSIS — Z20822 Contact with and (suspected) exposure to covid-19: Secondary | ICD-10-CM

## 2023-03-18 LAB — POC SARS CORONAVIRUS 2 AG -  ED: SARS Coronavirus 2 Ag: NEGATIVE

## 2023-03-18 MED ORDER — IPRATROPIUM BROMIDE 0.06 % NA SOLN: 2.0000 | Freq: Four times a day (QID) | NASAL | 0 refills | Status: DC

## 2023-03-18 NOTE — Discharge Instructions (Signed)
Try Atrovent nasal spray, saline nasal irrigation with a NeilMed sinus rinse and distilled water as often as you want.  Finish the Augmentin, even if you feel better.  Continue Robitussin and Tylenol.  Follow-up with your primary care provider or may return here if not better in 6 days, or if you start getting worse.

## 2023-03-18 NOTE — ED Triage Notes (Signed)
Cough & congestion since Friday  morning Home test for COVID today - unsure of results  Pt  is on chemo for stage 4  breast cancer  Today is her off week  Here w/ spouse

## 2023-03-18 NOTE — ED Provider Notes (Signed)
HPI  SUBJECTIVE:  Anne [name] is a 82 y.o. female who presents with 4 days of headaches, body, nasal congestion, rhinorrhea, sinus pain and pressure, postnasal drip, cough due to the postnasal drip.  Symptoms started after hugging a sick contact 5 days ago.  Her husband is also here today with identical symptoms.  No known COVID, flu exposure.  No ear pain, fevers, loss of sense of smell or taste, wheezing, shortness of breath, chest pain, nausea, vomiting, diarrhea.  She reports diffuse abdominal discomfort, but no distention.  Last bowel movement was 4 days ago.  She has been taking Robitussin and Tylenol with improvement in her symptoms.  No aggravating factors.  She patient had a negative home COVID test earlier today. Patient has a past medical history of COPD/asthma, breast cancer, currently on chemotherapy, carotid artery disease, dementia, hypertension.  [facility]: [facility].  Past Medical History:  Diagnosis Date   ALKALINE PHOSPHATASE, ELEVATED 03/15/2009   Allergic state 06/10/2012   Allergy    Anemia    Anxiety and depression 04/28/2011   Arthritis    Asthma    Atypical chest pain 11/30/2011   AVM (arteriovenous malformation) of colon 2011   cecum   Baker's cyst of knee 05/22/2011   Cancer (HCC) 11/2006   XRT/chemo 01-02/ lobular invasive ca   Carotid artery disease (HCC)    a. Carotid duplex 03/2014: stable 1-39% BICA, f/u due 03/2016.   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     Clotting disorder (HCC)    COPD (chronic obstructive pulmonary disease) (HCC)    Dementia (HCC)    Dermatitis 11/23/2012   Dyspnea    EE (eosinophilic esophagitis)    Emphysema of lung (HCC)    Esophageal ring    ESOPHAGEAL STRICTURE 03/29/2009   Fall 11/23/2012   Family history of breast cancer    Family history of colon cancer    Family history of ovarian cancer    Family history of pancreatic cancer     Folliculitis of nose 12/31/2011   GERD (gastroesophageal reflux disease) 09/29/2009   improved s/p cholecystectomy and esophagus dilatation   History of chicken pox    History of measles    History of shingles    2 episodes   Hx of echocardiogram    a. Echo 01/2013: mild LVH, EF 55-60%, normal wall motion, Gr 1 diast dysfn   Hyperlipidemia    Hypertension    Knee pain, bilateral 07/23/2011   Medicare annual wellness visit, subsequent 06/19/2015   Mixed hyperlipidemia 10/17/2010   Qualifier: Diagnosis of  By: Omar Person     Orthostasis    Osteopenia 03/14/2011   Osteoporosis    Personal history of chemotherapy 2001   Personal history of radiation therapy 2001   rt breast   PERSONAL HX BREAST CANCER 09/29/2009   Pneumonia    PVC's (premature ventricular contractions)    a. Event monitor 01/2013: NSR, extensive PVCs.   Radial neck fracture 10/2011   minimally displaced   Substance abuse (HCC)    In remission 3 years.    Urinary incontinence 03/19/2012   Vaginitis 05/22/2011    Past Surgical History:  Procedure Laterality Date   APPENDECTOMY     AUGMENTATION MAMMAPLASTY Right 05/27/2007   BREAST BIOPSY Right 01/02/2007   wire loc   BREAST BIOPSY  12/26/2006   BREAST LUMPECTOMY Right 2001   BREAST RECONSTRUCTION  2008,  2009, 2010   BREAST REDUCTION WITH MASTOPEXY Left 05/30/2017   Procedure: LEFT BREAST REDUCTION FOR SYMTERY WITH MASTOPEXY;  Surgeon: Peggye Form, DO;  Location: [facility];  Service: Plastics;  Laterality: Left;   CHOLECYSTECTOMY  2010   COLONOSCOPY  09/05/10   [facility]   DENTAL SURGERY  05/2016   4 dental implants by Dr. Retta Mac.   ERCP  2010    CBD stone extraction    ESOPHAGOGASTRODUODENOSCOPY  01/08/2012   Procedure: ESOPHAGOGASTRODUODENOSCOPY (EGD);  Surgeon: [name], MD;  Location: [facility];  Service: Endoscopy;  Laterality: N/A;   ESOPHAGOGASTRODUODENOSCOPY (EGD) WITH ESOPHAGEAL DILATION  2010, 2012    LAPAROSCOPIC APPENDECTOMY N/A 08/04/2016   Procedure: APPENDECTOMY LAPAROSCOPIC;  Surgeon: Karie Soda, MD;  Location: [facility];  Service: General;  Laterality: N/A;   LIPOSUCTION WITH LIPOFILLING Left 11/21/2017   Procedure: LIPOSUCTION FROM ABDOMEN WITH LIPOFILLING TO LEFT BREAST;  Surgeon: Peggye Form, DO;  Location: [facility];  Service: Plastics;  Laterality: Left;   MASTECTOMY MODIFIED RADICAL Right 05/27/2007   , Mastectomy modified radical (08), breast reconstruction, CA lesions excised lateral abd wall 2010   MASTOPEXY Left 11/21/2017   Procedure: LEFT BREAST REVISION MASTOPEXY FOR SYMMETRY;  Surgeon: Peggye Form, DO;  Location: [facility];  Service: Plastics;  Laterality: Left;   REDUCTION MAMMAPLASTY Left    SAVORY DILATION  01/08/2012   Procedure: SAVORY DILATION;  Surgeon: [name], MD;  Location: [facility];  Service: Endoscopy;  Laterality: N/A;  need xray    Family History  Problem Relation Age of Onset   Other Mother        tic [place]   Heart disease Father    Lung cancer Father        smoker   Cirrhosis Sister        Primary Biliary   Anxiety disorder Sister    Osteoporosis Sister    Arthritis Sister        Rheumatoid   Osteoporosis Sister    Skin cancer Sister        multiple skin cancers, over 90 excisions.   Stroke Maternal Grandmother    Alcohol abuse Maternal Grandfather    Heart disease Paternal Grandfather    Esophageal cancer Paternal Grandfather    Rectal cancer Paternal Grandfather    Breast cancer Maternal Aunt        dx in her 12s   Breast cancer Maternal Aunt        dx in her 11s   Breast cancer Maternal Aunt        possible breast cancer dx and died in her 78s   Ovarian cancer Maternal Aunt 29   Pancreatic cancer Maternal Aunt        dx in her 47s   Pancreatic cancer Maternal Uncle        dx in his 70s; smoker   Stomach cancer Maternal Uncle    Cancer Maternal Uncle    Breast cancer  Paternal Aunt        dx in her 31s   Lung cancer Paternal Aunt    Breast cancer Cousin        paternal first cousin   Breast cancer Cousin        maternal first cousin   Anesthesia problems Neg Hx    Hypotension Neg Hx    Malignant hyperthermia Neg Hx    Pseudochol deficiency Neg Hx  Social History   Tobacco Use   Smoking status: Former    Packs/day: 1.00    Years: 50.00    Additional pack years: 0.00    Total pack years: 50.00    Types: Cigarettes    Quit date: 05/22/2007    Years since quitting: 15.8   Smokeless tobacco: Never  Vaping Use   Vaping Use: Never used  Substance Use Topics   Alcohol use: Not Currently   Drug use: No    No current facility-administered medications for this encounter.  Current Outpatient Medications:    amoxicillin-clavulanate (AUGMENTIN) 875-125 MG tablet, Take by mouth., Disp: , Rfl:    ipratropium (ATROVENT) 0.06 % nasal spray, Place 2 sprays into both nostrils 4 (four) times daily., Disp: 15 mL, Rfl: 0   phenazopyridine (PYRIDIUM) 100 MG tablet, Take by mouth., Disp: , Rfl:    acetaminophen (TYLENOL) 500 MG tablet, Take 500-1,000 mg by mouth every 6 (six) hours as needed for moderate pain., Disp: , Rfl:    amitriptyline (ELAVIL) 25 MG tablet, TAKE 4 TABLETS BY MOUTH DAILY AT BEDTIME, Disp: 180 tablet, Rfl: 5   aspirin 81 MG EC tablet, Take 81 mg by mouth daily., Disp: , Rfl:    atorvastatin (LIPITOR) 20 MG tablet, Take 1 tablet by mouth in the morning and at bedtime., Disp: 180 tablet, Rfl: 3   Biotin 5 MG CAPS, Take 5 mg by mouth daily., Disp: , Rfl:    Brimonidine Tartrate (LUMIFY) 0.025 % SOLN, Place 1 drop into both eyes daily., Disp: , Rfl:    capecitabine (XELODA) 150 MG tablet, Take 600-900 mg by mouth See admin instructions. Take 900 mg by mouth in the morning and 600 mg in the evening daily every other week., Disp: , Rfl:    Cholecalciferol (VITAMIN D3) 1000 UNITS CAPS, Take 2,000 Units by mouth daily., Disp: , Rfl:    COVID-19  mRNA vaccine 2023-2024 (COMIRNATY) syringe, Inject into the muscle., Disp: 0.3 mL, Rfl: 0   Cyanocobalamin (VITAMIN B 12 PO), Take 2 tablets by mouth daily., Disp: , Rfl:    cyclobenzaprine (FLEXERIL) 10 MG tablet, One half to one tab PO qHS, then increase gradually to one tab TID., Disp: 30 tablet, Rfl: 0   diclofenac Sodium (VOLTAREN ARTHRITIS PAIN) 1 % GEL, Apply 2 g topically daily as needed (hip pain)., Disp: , Rfl:    diltiazem (CARDIZEM) 30 MG tablet, TAKE 1 TABLET BY MOUTH DAILY FOR PALPITATIONS, Disp: 90 tablet, Rfl: 3   FLUoxetine (PROZAC) 40 MG capsule, Take 1 capsule (40 mg total) by mouth daily., Disp: 30 capsule, Rfl: 0   Folic Acid (FOLATE PO), Take 1 tablet by mouth daily., Disp: , Rfl:    influenza vaccine adjuvanted (FLUAD QUADRIVALENT) 0.5 ML injection, Inject into the muscle., Disp: 0.5 mL, Rfl: 0   loratadine (CLARITIN) 10 MG tablet, Take 1 tablet (10 mg total) by mouth daily., Disp: 30 tablet, Rfl: 11   LORazepam (ATIVAN) 1 MG tablet, TAKE 1 TABLET(1 MG) BY MOUTH EVERY 8 HOURS AS NEEDED FOR ANXIETY, Disp: 90 tablet, Rfl: 2   pantoprazole (PROTONIX) 40 MG tablet, TAKE 1 TABLET(40 MG) BY MOUTH TWICE DAILY BEFORE A MEAL, Disp: 180 tablet, Rfl: 0   pregabalin (LYRICA) 25 MG capsule, 1 capsule p.o. nightly for a week then 1 capsule p.o. twice daily for a week then 2 capsules p.o. twice daily for a week then 3 capsules p.o. twice daily, only increase dose if previous dose not effective, Disp: 90  capsule, Rfl: 3   Pyridoxine HCl (B-6 PO), Take 1 tablet by mouth daily., Disp: , Rfl:    Thiamine HCl (B-1 PO), Take 1 tablet by mouth daily., Disp: , Rfl:   Allergies  Allergen Reactions   Sorbitol Other (See Comments)    GI Issues   Tomato Diarrhea   Zofran Other (See Comments)    headache   Advil [Ibuprofen] Other (See Comments)    Irritates throat.   Cephalexin Other (See Comments)    dizziness   Diphenhydramine Hcl Other (See Comments)    "nervous and upset" does okay w/ cream    Morphine And Related Other (See Comments)    Causes depression   Tramadol Other (See Comments)    Dizziness   Antihistamines, Chlorpheniramine-Type Anxiety   Oxycodone Anxiety    Confusion with inability to think clearly     ROS  As noted in HPI.   Physical Exam  BP 106/73 (BP Location: Left Arm)   Pulse 85   Temp 98.7 F (37.1 C) (Oral)   Resp 18   Wt 62.1 kg   SpO2 97%   BMI 22.80 kg/m   Constitutional: Well developed, well nourished, no acute distress Eyes:  EOMI, conjunctiva normal bilaterally HENT: Normocephalic, atraumatic,mucus membranes moist.  Positive nasal congestion.  Normal turbinates.  No maxillary, frontal sinus tenderness.  Unable to adequately visualize oropharynx. Neck: No cervical lymphadenopathy Respiratory: Normal inspiratory effort, lungs clear bilaterally, good air movement Cardiovascular: Normal rate, regular rhythm, no murmurs rubs or gallops GI: nondistended, soft, nontender, no distention.  No guarding, rebound.  Active bowel sounds.  Positive open lap chole scar. Back: No CVAT skin: No rash, skin intact Musculoskeletal: no deformities Neurologic: Alert & oriented x 3, no focal neuro deficits Psychiatric: Speech and behavior appropriate   ED Course   Medications - No data to display  Orders Placed This Encounter  Procedures   POC SARS Coronavirus 2 Ag-ED - Nasal Swab    Standing Status:   Standing    Number of Occurrences:   1    Results for orders placed or performed during the hospital encounter of 03/18/23 (from the past 24 hour(s))  POC SARS Coronavirus 2 Ag-ED - Nasal Swab     Status: None   Collection Time: 03/18/23  5:19 PM  Result Value Ref Range   SARS Coronavirus 2 Ag Negative Negative   *Note: Due to a large number of results and/or encounters for the requested time period, some results have not been displayed. A complete set of results can be found in Results Review.   No results found.  ED Clinical  Impression  1. Upper respiratory tract infection, unspecified type   2. Encounter for laboratory testing for COVID-19 virus      ED Assessment/Plan  Patient's home COVID test is negative, and has a negative test here.  Presentation most consistent with an upper respiratory infection.  She is to continue the Augmentin to cover any possible sinusitis as she is reporting sinus pain and pressure.  I was unable to appreciate any sinus tenderness or purulent nasal drainage on exam today.  Will start her on saline nasal irrigation, Atrovent nasal spray.  Continue Robitussin, Tylenol, increase electrolyte containing fluids.  Follow-up with PCP as needed.     Discussed labs,  MDM, treatment plan, and plan for follow-up with patient.  patient agrees with plan.   Meds ordered this encounter  Medications   ipratropium (ATROVENT) 0.06 % nasal spray  Sig: Place 2 sprays into both nostrils 4 (four) times daily.    Dispense:  15 mL    Refill:  0      *This clinic note was created using Scientist, clinical (histocompatibility and immunogenetics). Therefore, there may be occasional mistakes despite careful proofreading.  ?    Domenick Gong, MD 03/18/23 [phone]

## 2023-03-19 ENCOUNTER — Encounter: Payer: Self-pay | Admitting: Family Medicine

## 2023-03-20 NOTE — Telephone Encounter (Signed)
LM requesting call back to discuss symptoms and make an appointment.  MyChart message sent to patient.

## 2023-03-22 ENCOUNTER — Ambulatory Visit (INDEPENDENT_AMBULATORY_CARE_PROVIDER_SITE_OTHER): Payer: Medicare HMO | Admitting: *Deleted

## 2023-03-22 VITALS — BP 104/66 | HR 101 | Ht 65.0 in | Wt 139.0 lb

## 2023-03-22 DIAGNOSIS — Z0000 Encounter for general adult medical examination without abnormal findings: Secondary | ICD-10-CM | POA: Diagnosis not present

## 2023-03-22 NOTE — Progress Notes (Signed)
Subjective:   Anne [name] is a 82 y.o. female who presents for Medicare Annual (Subsequent) preventive examination.  Review of Systems     Cardiac Risk Factors include: dyslipidemia     Objective:    Today's Vitals   03/22/23 1413  BP: 104/66  Pulse: (!) 101  Weight: 139 lb (63 kg)  Height: 5\' 5"  (1.651 m)   Body mass index is 23.13 kg/m.     03/22/2023    2:18 PM 11/13/2022    1:19 PM 11/08/2022    6:52 PM 02/27/2022    2:52 PM 10/22/2021   10:28 PM 04/13/2021   12:43 AM 12/01/2020    2:13 PM  Advanced Directives  Does Patient Have a Medical Advance Directive? No No No No No No No  Would patient like information on creating a medical advance directive? No - Patient declined  No - Patient declined No - Patient declined No - Patient declined  No - Patient declined    Current Medications (verified) Outpatient Encounter Medications as of 03/22/2023  Medication Sig   acetaminophen (TYLENOL) 500 MG tablet Take 500-1,000 mg by mouth every 6 (six) hours as needed for moderate pain.   amitriptyline (ELAVIL) 25 MG tablet TAKE 4 TABLETS BY MOUTH DAILY AT BEDTIME   amoxicillin-clavulanate (AUGMENTIN) 875-125 MG tablet Take by mouth.   aspirin 81 MG EC tablet Take 81 mg by mouth daily.   atorvastatin (LIPITOR) 20 MG tablet Take 1 tablet by mouth in the morning and at bedtime.   Biotin 5 MG CAPS Take 5 mg by mouth daily.   Brimonidine Tartrate (LUMIFY) 0.025 % SOLN Place 1 drop into both eyes daily.   capecitabine (XELODA) 150 MG tablet Take 600-900 mg by mouth See admin instructions. Take 900 mg by mouth in the morning and 600 mg in the evening daily every other week.   Cholecalciferol (VITAMIN D3) 1000 UNITS CAPS Take 2,000 Units by mouth daily.   COVID-19 mRNA vaccine 2023-2024 (COMIRNATY) syringe Inject into the muscle.   Cyanocobalamin (VITAMIN B 12 PO) Take 2 tablets by mouth daily.   cyclobenzaprine (FLEXERIL) 10 MG tablet One half to one tab PO qHS, then increase gradually  to one tab TID.   diclofenac Sodium (VOLTAREN ARTHRITIS PAIN) 1 % GEL Apply 2 g topically daily as needed (hip pain).   diltiazem (CARDIZEM) 30 MG tablet TAKE 1 TABLET BY MOUTH DAILY FOR PALPITATIONS   FLUoxetine (PROZAC) 40 MG capsule Take 1 capsule (40 mg total) by mouth daily.   Folic Acid (FOLATE PO) Take 1 tablet by mouth daily.   influenza vaccine adjuvanted (FLUAD QUADRIVALENT) 0.5 ML injection Inject into the muscle.   ipratropium (ATROVENT) 0.06 % nasal spray Place 2 sprays into both nostrils 4 (four) times daily.   loratadine (CLARITIN) 10 MG tablet Take 1 tablet (10 mg total) by mouth daily.   LORazepam (ATIVAN) 1 MG tablet TAKE 1 TABLET(1 MG) BY MOUTH EVERY 8 HOURS AS NEEDED FOR ANXIETY   pantoprazole (PROTONIX) 40 MG tablet TAKE 1 TABLET(40 MG) BY MOUTH TWICE DAILY BEFORE A MEAL   pregabalin (LYRICA) 25 MG capsule 1 capsule p.o. nightly for a week then 1 capsule p.o. twice daily for a week then 2 capsules p.o. twice daily for a week then 3 capsules p.o. twice daily, only increase dose if previous dose not effective   Pyridoxine HCl (B-6 PO) Take 1 tablet by mouth daily.   Thiamine HCl (B-1 PO) Take 1 tablet by mouth daily.  No facility-administered encounter medications on file as of 03/22/2023.    Allergies (verified) Sorbitol; Tomato; Zofran; Advil [ibuprofen]; Cephalexin; Diphenhydramine hcl; Morphine and related; Tramadol; Antihistamines, chlorpheniramine-type; and Oxycodone   History: Past Medical History:  Diagnosis Date   ALKALINE PHOSPHATASE, ELEVATED 03/15/2009   Allergic state 06/10/2012   Allergy    Anemia    Anxiety and depression 04/28/2011   Arthritis    Asthma    Atypical chest pain 11/30/2011   AVM (arteriovenous malformation) of colon 2011   cecum   Baker's cyst of knee 05/22/2011   Cancer (HCC) 11/2006   XRT/chemo 01-02/ lobular invasive ca   Carotid artery disease (HCC)    a. Carotid duplex 03/2014: stable 1-39% BICA, f/u due 03/2016.   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     Clotting disorder (HCC)    COPD (chronic obstructive pulmonary disease) (HCC)    Dementia (HCC)    Dermatitis 11/23/2012   Dyspnea    EE (eosinophilic esophagitis)    Emphysema of lung (HCC)    Esophageal ring    ESOPHAGEAL STRICTURE 03/29/2009   Fall 11/23/2012   Family history of breast cancer    Family history of colon cancer    Family history of ovarian cancer    Family history of pancreatic cancer    Folliculitis of nose 12/31/2011   GERD (gastroesophageal reflux disease) 09/29/2009   improved s/p cholecystectomy and esophagus dilatation   History of chicken pox    History of measles    History of shingles    2 episodes   Hx of echocardiogram    a. Echo 01/2013: mild LVH, EF 55-60%, normal wall motion, Gr 1 diast dysfn   Hyperlipidemia    Hypertension    Knee pain, bilateral 07/23/2011   Medicare annual wellness visit, subsequent 06/19/2015   Mixed hyperlipidemia 10/17/2010   Qualifier: Diagnosis of  By: Omar Person     Orthostasis    Osteopenia 03/14/2011   Osteoporosis    Personal history of chemotherapy 2001   Personal history of radiation therapy 2001   rt breast   PERSONAL HX BREAST CANCER 09/29/2009   Pneumonia    PVC's (premature ventricular contractions)    a. Event monitor 01/2013: NSR, extensive PVCs.   Radial neck fracture 10/2011   minimally displaced   Substance abuse (HCC)    In remission 3 years.    Urinary incontinence 03/19/2012   Vaginitis 05/22/2011   Past Surgical History:  Procedure Laterality Date   APPENDECTOMY     AUGMENTATION MAMMAPLASTY Right 05/27/2007   BREAST BIOPSY Right 01/02/2007   wire loc   BREAST BIOPSY  12/26/2006   BREAST LUMPECTOMY Right 2001   BREAST RECONSTRUCTION  2008, 2009, 2010   BREAST REDUCTION WITH MASTOPEXY Left 05/30/2017   Procedure: LEFT BREAST REDUCTION FOR SYMTERY WITH MASTOPEXY;  Surgeon:  Peggye Form, DO;  Location: [facility];  Service: Plastics;  Laterality: Left;   CHOLECYSTECTOMY  2010   COLONOSCOPY  09/05/10   [facility]   DENTAL SURGERY  05/2016   4 dental implants by Dr. Retta Mac.   ERCP  2010    CBD stone extraction    ESOPHAGOGASTRODUODENOSCOPY  01/08/2012   Procedure: ESOPHAGOGASTRODUODENOSCOPY (EGD);  Surgeon: [name], MD;  Location: [facility];  Service: Endoscopy;  Laterality: N/A;   ESOPHAGOGASTRODUODENOSCOPY (EGD) WITH ESOPHAGEAL DILATION  2010, 2012   LAPAROSCOPIC APPENDECTOMY N/A 08/04/2016  Procedure: APPENDECTOMY LAPAROSCOPIC;  Surgeon: Karie Soda, MD;  Location: [facility];  Service: General;  Laterality: N/A;   LIPOSUCTION WITH LIPOFILLING Left 11/21/2017   Procedure: LIPOSUCTION FROM ABDOMEN WITH LIPOFILLING TO LEFT BREAST;  Surgeon: Peggye Form, DO;  Location: [facility];  Service: Plastics;  Laterality: Left;   MASTECTOMY MODIFIED RADICAL Right 05/27/2007   , Mastectomy modified radical (08), breast reconstruction, CA lesions excised lateral abd wall 2010   MASTOPEXY Left 11/21/2017   Procedure: LEFT BREAST REVISION MASTOPEXY FOR SYMMETRY;  Surgeon: Peggye Form, DO;  Location: [facility];  Service: Plastics;  Laterality: Left;   REDUCTION MAMMAPLASTY Left    SAVORY DILATION  01/08/2012   Procedure: SAVORY DILATION;  Surgeon: [name], MD;  Location: [facility];  Service: Endoscopy;  Laterality: N/A;  need xray   Family History  Problem Relation Age of Onset   Other Mother        tic [place]   Heart disease Father    Lung cancer Father        smoker   Cirrhosis Sister        Primary Biliary   Anxiety disorder Sister    Osteoporosis Sister    Arthritis Sister        Rheumatoid   Osteoporosis Sister    Skin cancer Sister        multiple skin cancers, over 90 excisions.   Stroke Maternal Grandmother    Alcohol abuse Maternal Grandfather    Heart disease  Paternal Grandfather    Esophageal cancer Paternal Grandfather    Rectal cancer Paternal Grandfather    Breast cancer Maternal Aunt        dx in her 93s   Breast cancer Maternal Aunt        dx in her 66s   Breast cancer Maternal Aunt        possible breast cancer dx and died in her 27s   Ovarian cancer Maternal Aunt 29   Pancreatic cancer Maternal Aunt        dx in her 81s   Pancreatic cancer Maternal Uncle        dx in his 64s; smoker   Stomach cancer Maternal Uncle    Cancer Maternal Uncle    Breast cancer Paternal Aunt        dx in her 8s   Lung cancer Paternal Aunt    Breast cancer Cousin        paternal first cousin   Breast cancer Cousin        maternal first cousin   Anesthesia problems Neg Hx    Hypotension Neg Hx    Malignant hyperthermia Neg Hx    Pseudochol deficiency Neg Hx    Social History   Socioeconomic History   Marital status: Married    Spouse name: Sam   Number of children: 3   Years of education: Not on file   Highest education level: Not on file  Occupational History   Occupation: Arboriculturist  Tobacco Use   Smoking status: Former    Packs/day: 1.00    Years: 50.00    Additional pack years: 0.00    Total pack years: 50.00    Types: Cigarettes    Quit date: 05/22/2007    Years since quitting: 15.8   Smokeless tobacco: Never  Vaping Use   Vaping Use: Never used  Substance and Sexual Activity   Alcohol use: Not Currently   Drug use:  No   Sexual activity: Yes    Partners: Male    Comment: lives with husband,   Other Topics Concern   Not on file  Social History Narrative   Not on file   Social Determinants of Health   Financial Resource Strain: Low Risk  (02/27/2022)   Overall Financial Resource Strain (CARDIA)    Difficulty of Paying Living Expenses: Not hard at all  Food Insecurity: No Food Insecurity (03/22/2023)   Hunger Vital Sign    Worried About Running Out of Food in the Last Year: Never true    Ran Out of Food in the Last Year:  Never true  Transportation Needs: No Transportation Needs (03/22/2023)   PRAPARE - Administrator, Civil Service (Medical): No    Lack of Transportation (Non-Medical): No  Physical Activity: Sufficiently Active (02/27/2022)   Exercise Vital Sign    Days of Exercise per Week: 7 days    Minutes of Exercise per Session: 60 min  Stress: No Stress Concern Present (02/27/2022)   [employer] of Occupational Health - Occupational Stress Questionnaire    Feeling of Stress : Not at all  Social Connections: Moderately Integrated (02/27/2022)   Social Connection and Isolation Panel [NHANES]    Frequency of Communication with Friends and Family: More than three times a week    Frequency of Social Gatherings with Friends and Family: More than three times a week    Attends Religious Services: More than 4 times per year    Active Member of [employer] or Organizations: No    Attends Engineer, structural: Never    Marital Status: Married    Tobacco Counseling Counseling given: Not Answered   Clinical Intake:  Pre-visit preparation completed: Yes  Pain : No/denies pain  BMI - recorded: 23.13 Nutritional Status: BMI of 19-24  Normal Nutritional Risks: None Diabetes: No  How often do you need to have someone help you when you read instructions, pamphlets, or other written materials from your doctor or pharmacy?: 1 - Never  Activities of Daily Living    03/22/2023    2:26 PM 11/13/2022    1:21 PM  In your present state of health, do you have any difficulty performing the following activities:  Hearing? 0   Vision? 1   Comment AMD   Difficulty concentrating or making decisions? 1   Comment some memory loss   Walking or climbing stairs? 1   Dressing or bathing? 0   Doing errands, shopping? 0 0  Preparing Food and eating ? N   Using the Toilet? N   In the past six months, have you accidently leaked urine? N   Do you have problems with loss of bowel control? N   Managing  your Medications? N   Managing your Finances? N   Housekeeping or managing your Housekeeping? N     Patient Care Team: Bradd Canary, MD as PCP - General (Family Medicine) Pricilla Riffle, MD as PCP - Cardiology (Cardiology) Durene Romans, MD as Consulting Physician (Orthopedic Surgery) [name], MD as Consulting Physician (Gastroenterology) Ernesto Rutherford, MD as Consulting Physician (Ophthalmology) Cherly Anderson, DDS (Oral Surgery) Kellie Shropshire (Dentistry) Haverstock, Elvin So, MD as Referring Physician (Dermatology) Dillingham, Alena Bills, DO as Attending Physician (Plastic Surgery) Malachy Mood, MD as Consulting Physician (Hematology) Day, Dannielle Karvonen, [facility] (Inactive) as Pharmacist (Pharmacist)  Indicate any recent Medical Services you may have received from other than [facility] providers in the past year (date may  be approximate).     Assessment:   This is a routine wellness examination for [place].  Hearing/Vision screen No results found.  Dietary issues and exercise activities discussed: Current Exercise Habits: Home exercise routine, Type of exercise: walking, Time (Minutes): 25, Frequency (Times/Week): 2, Weekly Exercise (Minutes/Week): 50, Intensity: Mild, Exercise limited by: None identified   Goals Addressed   None    Depression Screen    03/22/2023    2:23 PM 11/22/2022    2:22 PM 11/22/2022    2:20 PM 09/04/2022    2:52 PM 08/07/2022   11:19 AM 05/03/2022   10:02 AM 03/08/2022    1:33 PM  PHQ 2/9 Scores  PHQ - 2 Score 0 2 0 0 2 3 6   PHQ- 9 Score  3   5 9 17     Fall Risk    03/22/2023    2:36 PM 11/22/2022    2:20 PM 08/07/2022   11:19 AM 05/03/2022    9:51 AM 03/01/2022   10:29 AM  Fall Risk   Falls in the past year? 1  0 0 0  Comment tripped over an elevation in the sidewalk      Number falls in past yr: 0 1 0 0 0  Injury with Fall? 1 1 0 0 0  Risk for fall due to : History of fall(s)   No Fall Risks No Fall Risks  Follow up Falls evaluation completed Falls  evaluation completed Falls evaluation completed Falls evaluation completed Falls evaluation completed    FALL RISK PREVENTION PERTAINING TO THE HOME:  Any stairs in or around the home? Yes  If so, are there any without handrails? No  Home free of loose throw rugs in walkways, pet beds, electrical cords, etc? Yes  Adequate lighting in your home to reduce risk of falls? Yes   ASSISTIVE DEVICES UTILIZED TO PREVENT FALLS:  Life alert? No  Use of a cane, walker or w/c? No  Grab bars in the bathroom? Yes  Shower chair or bench in shower? No  Elevated toilet seat or a handicapped toilet? No   TIMED UP AND GO:  Was the test performed? Yes .  Length of time to ambulate 10 feet: 6 sec.   Gait steady and fast without use of assistive device  Cognitive Function:    03/11/2017   11:33 AM  MMSE - Mini Mental State Exam  Orientation to time 5  Orientation to Place 5  Registration 3  Attention/ Calculation 5  Recall 3  Language- name 2 objects 2  Language- repeat 1  Language- follow 3 step command 3  Language- read & follow direction 1  Write a sentence 1  Copy design 1  Total score 30        03/22/2023    2:33 PM 02/27/2022    3:04 PM  6CIT Screen  What Year? 0 points 0 points  What month? 0 points 0 points  What time? 0 points 0 points  Count back from 20 2 points 0 points  Months in reverse 0 points 0 points  Repeat phrase 6 points 10 points  Total Score 8 points 10 points    Immunizations Immunization History  Administered Date(s) Administered   COVID-19, mRNA, vaccine(Comirnaty)12 years and older 01/09/2023   Fluad Quad(high Dose 65+) 09/17/2019, 10/02/2021, 11/01/2022   Influenza Split 08/27/2011, 10/21/2012   Influenza, High Dose Seasonal PF 08/01/2016, 09/19/2017, 09/27/2020   Influenza, Quadrivalent, Recombinant, Inj, Pf 08/19/2018   Influenza,inj,Quad PF,6+ Mos  07/31/2013, 12/07/2014, 11/01/2015   PFIZER Comirnaty(Gray Top)Covid-19 Tri-Sucrose Vaccine  01/10/2020, 02/09/2020, 09/27/2020   PFIZER(Purple Top)SARS-COV-2 Vaccination 01/10/2020, 02/09/2020, 09/27/2020   Pfizer Covid-19 Vaccine Bivalent Booster 72yrs & up 10/25/2021   Tdap 04/08/2013, 11/09/2022    TDAP status: Up to date  Flu Vaccine status: Up to date  Pneumococcal vaccine status: Declined,  Education has been provided regarding the importance of this vaccine but patient still declined. Advised may receive this vaccine at local pharmacy or Health Dept. Aware to provide a copy of the vaccination record if obtained from local pharmacy or Health Dept. Verbalized acceptance and understanding.   Covid-19 vaccine status: Information provided on how to obtain vaccines.   Qualifies for Shingles Vaccine? Yes   Zostavax completed No   Shingrix Completed?: No.    Education has been provided regarding the importance of this vaccine. Patient has been advised to call insurance company to determine out of pocket expense if they have not yet received this vaccine. Advised may also receive vaccine at local pharmacy or Health Dept. Verbalized acceptance and understanding.  Screening Tests Health Maintenance  Topic Date Due   Medicare Annual Wellness (AWV)  02/28/2023   COVID-19 Vaccine (9 - 2023-24 season) 03/06/2023   Zoster Vaccines- Shingrix (1 of 2) 06/22/2023 (Originally 09/08/1960)   Pneumonia Vaccine 50+ Years old (1 of 2 - PCV) 03/21/2024 (Originally 09/08/1947)   INFLUENZA VACCINE  06/13/2023   DTaP/Tdap/Td (3 - Td or Tdap) 11/09/2032   DEXA SCAN  Completed   HPV VACCINES  Aged Out   COLONOSCOPY (Pts 45-75yrs Insurance coverage will need to be confirmed)  Discontinued    Health Maintenance  Health Maintenance Due  Topic Date Due   Medicare Annual Wellness (AWV)  02/28/2023   COVID-19 Vaccine (9 - 2023-24 season) 03/06/2023    Colorectal cancer screening: No longer required.   Mammogram status: Completed 06/04/22. Repeat every year  Bone Density status: Completed  07/27/20. Results reflect: Bone density results: OSTEOPENIA. Repeat every 2 years.  Lung Cancer Screening: (Low Dose CT Chest recommended if Age 59-80 years, 30 pack-year currently smoking OR have quit w/in 15years.) does not qualify.   Additional Screening:  Hepatitis C Screening: does not qualify  Vision Screening: Recommended annual ophthalmology exams for early detection of glaucoma and other disorders of the eye. Is the patient up to date with their annual eye exam?  Yes  Who is the provider or what is the name of the office in which the patient attends annual eye exams? Dr. Luciana Axe If pt is not established with a provider, would they like to be referred to a provider to establish care? No .   Dental Screening: Recommended annual dental exams for proper oral hygiene  Community Resource Referral / Chronic Care Management: CRR required this visit?  No   CCM required this visit?  No      Plan:     I have personally reviewed and noted the following in the patient's chart:   Medical and social history Use of alcohol, tobacco or illicit drugs  Current medications and supplements including opioid prescriptions. Patient is not currently taking opioid prescriptions. Functional ability and status Nutritional status Physical activity Advanced directives List of other physicians Hospitalizations, surgeries, and ER visits in previous 12 months Vitals Screenings to include cognitive, depression, and falls Referrals and appointments  In addition, I have reviewed and discussed with patient certain preventive protocols, quality metrics, and best practice recommendations. A written personalized care plan for preventive services  as well as general preventive health recommendations were provided to patient.     Donne Anon, [place]   03/22/2023   Nurse Notes: None

## 2023-03-22 NOTE — Patient Instructions (Signed)
Anne [name] , Thank you for taking time to come for your Medicare Wellness Visit. I appreciate your ongoing commitment to your health goals. Please review the following plan we discussed and let me know if I can assist you in the future.     This is a list of the screening recommended for you and due dates:  Health Maintenance  Topic Date Due   COVID-19 Vaccine (9 - 2023-24 season) 03/06/2023   Zoster (Shingles) Vaccine (1 of 2) 06/22/2023*   Pneumonia Vaccine (1 of 2 - PCV) 03/21/2024*   Flu Shot  06/13/2023   Medicare Annual Wellness Visit  03/21/2024   DTaP/Tdap/Td vaccine (3 - Td or Tdap) 11/09/2032   DEXA scan (bone density measurement)  Completed   HPV Vaccine  Aged Out   Colon Cancer Screening  Discontinued  *Topic was postponed. The date shown is not the original due date.    Next appointment: Follow up in one year for your annual wellness visit.   Preventive Care 46 Years and Older, Female Preventive care refers to lifestyle choices and visits with your health care provider that can promote health and wellness. What does preventive care include? A yearly physical exam. This is also called an annual well check. Dental exams once or twice a year. Routine eye exams. Ask your health care provider how often you should have your eyes checked. Personal lifestyle choices, including: Daily care of your teeth and gums. Regular physical activity. Eating a healthy diet. Avoiding tobacco and drug use. Limiting alcohol use. Practicing safe sex. Taking low-dose aspirin every day. Taking vitamin and mineral supplements as recommended by your health care provider. What happens during an annual well check? The services and screenings done by your health care provider during your annual well check will depend on your age, overall health, lifestyle risk factors, and family history of disease. Counseling  Your health care provider may ask you questions about your: Alcohol use. Tobacco  use. Drug use. Emotional well-being. Home and relationship well-being. Sexual activity. Eating habits. History of falls. Memory and ability to understand (cognition). Work and work Astronomer. Reproductive health. Screening  You may have the following tests or measurements: Height, weight, and BMI. Blood pressure. Lipid and cholesterol levels. These may be checked every 5 years, or more frequently if you are over 56 years old. Skin check. Lung cancer screening. You may have this screening every year starting at age 81 if you have a 30-pack-year history of smoking and currently smoke or have quit within the past 15 years. Fecal occult blood test (FOBT) of the stool. You may have this test every year starting at age 81. Flexible sigmoidoscopy or colonoscopy. You may have a sigmoidoscopy every 5 years or a colonoscopy every 10 years starting at age 81. Hepatitis C blood test. Hepatitis B blood test. Sexually transmitted disease (STD) testing. Diabetes screening. This is done by checking your blood sugar (glucose) after you have not eaten for a while (fasting). You may have this done every 1-3 years. Bone density scan. This is done to screen for osteoporosis. You may have this done starting at age 81. Mammogram. This may be done every 1-2 years. Talk to your health care provider about how often you should have regular mammograms. Talk with your health care provider about your test results, treatment options, and if necessary, the need for more tests. Vaccines  Your health care provider may recommend certain vaccines, such as: Influenza vaccine. This is recommended every year. Tetanus,  diphtheria, and acellular pertussis (Tdap, Td) vaccine. You may need a Td booster every 10 years. Zoster vaccine. You may need this after age 21. Pneumococcal 13-valent conjugate (PCV13) vaccine. One dose is recommended after age 81. Pneumococcal polysaccharide (PPSV23) vaccine. One dose is recommended  after age 81. Talk to your health care provider about which screenings and vaccines you need and how often you need them. This information is not intended to replace advice given to you by your health care provider. Make sure you discuss any questions you have with your health care provider. Document Released: 11/25/2015 Document Revised: 07/18/2016 Document Reviewed: 08/30/2015 Elsevier Interactive Patient Education  2017 [place] Prevention in the Home Falls can cause injuries. They can happen to people of all ages. There are many things you can do to make your home safe and to help prevent falls. What can I do on the outside of my home? Regularly fix the edges of walkways and driveways and fix any cracks. Remove anything that might make you trip as you walk through a door, such as a raised step or threshold. Trim any bushes or trees on the path to your home. Use bright outdoor lighting. Clear any walking paths of anything that might make someone trip, such as rocks or tools. Regularly check to see if handrails are loose or broken. Make sure that both sides of any steps have handrails. Any raised decks and porches should have guardrails on the edges. Have any leaves, snow, or ice cleared regularly. Use sand or salt on walking paths during winter. Clean up any spills in your garage right away. This includes oil or grease spills. What can I do in the bathroom? Use night lights. Install grab bars by the toilet and in the tub and shower. Do not use towel bars as grab bars. Use non-skid mats or decals in the tub or shower. If you need to sit down in the shower, use a plastic, non-slip stool. Keep the floor dry. Clean up any water that spills on the floor as soon as it happens. Remove soap buildup in the tub or shower regularly. Attach bath mats securely with double-sided non-slip rug tape. Do not have throw rugs and other things on the floor that can make you trip. What can I do  in the bedroom? Use night lights. Make sure that you have a light by your bed that is easy to reach. Do not use any sheets or blankets that are too big for your bed. They should not hang down onto the floor. Have a firm chair that has side arms. You can use this for support while you get dressed. Do not have throw rugs and other things on the floor that can make you trip. What can I do in the kitchen? Clean up any spills right away. Avoid walking on wet floors. Keep items that you use a lot in easy-to-reach places. If you need to reach something above you, use a strong step stool that has a grab bar. Keep electrical cords out of the way. Do not use floor polish or wax that makes floors slippery. If you must use wax, use non-skid floor wax. Do not have throw rugs and other things on the floor that can make you trip. What can I do with my stairs? Do not leave any items on the stairs. Make sure that there are handrails on both sides of the stairs and use them. Fix handrails that are broken or loose. Make  sure that handrails are as long as the stairways. Check any carpeting to make sure that it is firmly attached to the stairs. Fix any carpet that is loose or worn. Avoid having throw rugs at the top or bottom of the stairs. If you do have throw rugs, attach them to the floor with carpet tape. Make sure that you have a light switch at the top of the stairs and the bottom of the stairs. If you do not have them, ask someone to add them for you. What else can I do to help prevent falls? Wear shoes that: Do not have high heels. Have rubber bottoms. Are comfortable and fit you well. Are closed at the toe. Do not wear sandals. If you use a stepladder: Make sure that it is fully opened. Do not climb a closed stepladder. Make sure that both sides of the stepladder are locked into place. Ask someone to hold it for you, if possible. Clearly mark and make sure that you can see: Any grab bars or  handrails. First and last steps. Where the edge of each step is. Use tools that help you move around (mobility aids) if they are needed. These include: Canes. Walkers. Scooters. Crutches. Turn on the lights when you go into a dark area. Replace any light bulbs as soon as they burn out. Set up your furniture so you have a clear path. Avoid moving your furniture around. If any of your floors are uneven, fix them. If there are any pets around you, be aware of where they are. Review your medicines with your doctor. Some medicines can make you feel dizzy. This can increase your chance of falling. Ask your doctor what other things that you can do to help prevent falls. This information is not intended to replace advice given to you by your health care provider. Make sure you discuss any questions you have with your health care provider. Document Released: 08/25/2009 Document Revised: 04/05/2016 Document Reviewed: 12/03/2014 Elsevier Interactive Patient Education  2017 [employer].

## 2023-03-25 ENCOUNTER — Ambulatory Visit (INDEPENDENT_AMBULATORY_CARE_PROVIDER_SITE_OTHER): Payer: Medicare HMO

## 2023-03-25 ENCOUNTER — Ambulatory Visit
Admission: EM | Admit: 2023-03-25 | Discharge: 2023-03-25 | Disposition: A | Discharge status: Home / Self Care | Payer: Medicare HMO | Attending: Family Medicine | Admitting: Family Medicine

## 2023-03-25 DIAGNOSIS — R059 Cough, unspecified: Secondary | ICD-10-CM | POA: Diagnosis not present

## 2023-03-25 DIAGNOSIS — R0602 Shortness of breath: Secondary | ICD-10-CM

## 2023-03-25 DIAGNOSIS — J40 Bronchitis, not specified as acute or chronic: Secondary | ICD-10-CM

## 2023-03-25 MED ORDER — PREDNISONE 10 MG (21) PO TBPK: ORAL_TABLET | Freq: Every day | ORAL | 0 refills | Status: DC

## 2023-03-25 MED ORDER — DOXYCYCLINE HYCLATE 100 MG PO CAPS: 100.0000 mg | ORAL_CAPSULE | Freq: Two times a day (BID) | ORAL | 0 refills | Status: AC

## 2023-03-25 MED ORDER — BENZONATATE 200 MG PO CAPS: 200.0000 mg | ORAL_CAPSULE | Freq: Three times a day (TID) | ORAL | 0 refills | Status: AC | PRN

## 2023-03-25 NOTE — ED Provider Notes (Signed)
[facility]    CSN: [id number] Arrival date & time: 03/25/23  1302      History   Chief Complaint Chief Complaint  Patient presents with   Cough   Shortness of Breath    HPI Anne [name] is a 82 y.o. female.   HPI 82 year old female presents with shortness of breath cough for 1 week.  Reports pulse ox of 88% this morning.  PMH significant for cancer, COPD, chronic alcoholism in remission, and history of PVCs.  Patient is accompanied by her husband this afternoon.  Past Medical History:  Diagnosis Date   ALKALINE PHOSPHATASE, ELEVATED 03/15/2009   Allergic state 06/10/2012   Allergy    Anemia    Anxiety and depression 04/28/2011   Arthritis    Asthma    Atypical chest pain 11/30/2011   AVM (arteriovenous malformation) of colon 2011   cecum   Baker's cyst of knee 05/22/2011   Cancer (HCC) 11/2006   XRT/chemo 01-02/ lobular invasive ca   Carotid artery disease (HCC)    a. Carotid duplex 03/2014: stable 1-39% BICA, f/u due 03/2016.   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     Clotting disorder (HCC)    COPD (chronic obstructive pulmonary disease) (HCC)    Dementia (HCC)    Dermatitis 11/23/2012   Dyspnea    EE (eosinophilic esophagitis)    Emphysema of lung (HCC)    Esophageal ring    ESOPHAGEAL STRICTURE 03/29/2009   Fall 11/23/2012   Family history of breast cancer    Family history of colon cancer    Family history of ovarian cancer    Family history of pancreatic cancer    Folliculitis of nose 12/31/2011   GERD (gastroesophageal reflux disease) 09/29/2009   improved s/p cholecystectomy and esophagus dilatation   History of chicken pox    History of measles    History of shingles    2 episodes   Hx of echocardiogram    a. Echo 01/2013: mild LVH, EF 55-60%, normal wall motion, Gr 1 diast dysfn   Hyperlipidemia    Hypertension    Knee pain, bilateral 07/23/2011    Medicare annual wellness visit, subsequent 06/19/2015   Mixed hyperlipidemia 10/17/2010   Qualifier: Diagnosis of  By: Omar Person     Orthostasis    Osteopenia 03/14/2011   Osteoporosis    Personal history of chemotherapy 2001   Personal history of radiation therapy 2001   rt breast   PERSONAL HX BREAST CANCER 09/29/2009   Pneumonia    PVC's (premature ventricular contractions)    a. Event monitor 01/2013: NSR, extensive PVCs.   Radial neck fracture 10/2011   minimally displaced   Substance abuse (HCC)    In remission 3 years.    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   Facial trauma, sequela 08/09/2019   Hyperglycemia 08/19/2018   Mild intermittent asthma without complication 06/17/2018  Asthma 04/30/2018   Macular degeneration, 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   Palpitations 04/30/2018   Skin lesion of face 02/07/2018   Lumbar spondylosis 09/23/2017   Hypokalemia 08/11/2017   Abdominal pain 08/11/2017   SBO (small bowel obstruction) (HCC) 08/10/2017   History of right breast cancer 03/21/2017   Genetic testing 09/13/2016   Family history of breast cancer    Family history of pancreatic cancer    Family history of colon cancer    Pain in the chest 05/16/2016   Status post right breast reconstruction 05/11/2016   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) 05/11/2016   History of breast cancer in female 05/11/2016   Osteopenia determined by x-ray 05/11/2016   IBS  (irritable bowel syndrome) 03/09/2016   Dyspnea 06/19/2015   Medicare annual wellness visit, subsequent 06/19/2015   Urinary frequency 12/12/2014   Breast cancer, right breast (HCC) 10/18/2014   Superficial thrombophlebitis 03/16/2014   Plant dermatitis 03/16/2014   Chest wall pain 02/07/2014   Primary osteoarthritis of both hips 01/21/2014   Elevated sed rate 08/02/2013   Dermatitis 11/23/2012   Falls 11/23/2012   Preventative health care 10/21/2012   Allergy 06/10/2012   Urinary incontinence 03/19/2012   Anemia 12/21/2011   Baker's cyst of knee 05/22/2011   Eosinophilic esophagitis 05/11/2011   Anxiety and depression 04/28/2011   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   Constipation, chronic 03/15/2011   Mixed hyperlipidemia 10/17/2010   CAROTID ARTERY STENOSIS 01/13/2010   GERD 09/29/2009   PERSONAL HX BREAST CANCER 09/29/2009    Past Surgical History:  Procedure Laterality Date   APPENDECTOMY     AUGMENTATION MAMMAPLASTY Right 05/27/2007   BREAST BIOPSY Right 01/02/2007   wire loc   BREAST BIOPSY  12/26/2006   BREAST LUMPECTOMY Right 2001   BREAST RECONSTRUCTION  2008, 2009, 2010   BREAST REDUCTION WITH MASTOPEXY Left 05/30/2017   Procedure: LEFT BREAST REDUCTION FOR SYMTERY WITH MASTOPEXY;  Surgeon: Peggye Form, DO;  Location: [facility];  Service: Plastics;  Laterality: Left;   CHOLECYSTECTOMY  2010   COLONOSCOPY  09/05/10   [facility]   DENTAL SURGERY  05/2016   4 dental implants by Dr. Retta Mac.   ERCP  2010    CBD stone extraction    ESOPHAGOGASTRODUODENOSCOPY  01/08/2012   Procedure: ESOPHAGOGASTRODUODENOSCOPY (EGD);  Surgeon: [name], MD;  Location: [facility];  Service: Endoscopy;  Laterality: N/A;   ESOPHAGOGASTRODUODENOSCOPY (EGD) WITH ESOPHAGEAL DILATION  2010, 2012   LAPAROSCOPIC APPENDECTOMY N/A 08/04/2016   Procedure: APPENDECTOMY LAPAROSCOPIC;  Surgeon: Karie Soda, MD;  Location: [facility];  Service: General;   Laterality: N/A;   LIPOSUCTION WITH LIPOFILLING Left 11/21/2017   Procedure: LIPOSUCTION FROM ABDOMEN WITH LIPOFILLING TO LEFT BREAST;  Surgeon: Peggye Form, DO;  Location: [facility];  Service: Plastics;  Laterality: Left;   MASTECTOMY MODIFIED RADICAL Right 05/27/2007   , Mastectomy modified radical (08), breast reconstruction, CA lesions excised lateral abd wall 2010   MASTOPEXY Left 11/21/2017   Procedure: LEFT BREAST REVISION MASTOPEXY FOR SYMMETRY;  Surgeon: Peggye Form, DO;  Location: [facility];  Service: Plastics;  Laterality: Left;   REDUCTION MAMMAPLASTY Left    SAVORY DILATION  01/08/2012   Procedure: SAVORY DILATION;  Surgeon: [name], MD;  Location: [facility];  Service: Endoscopy;  Laterality: N/A;  need xray    OB History   No obstetric history on file.      Home Medications  Prior to Admission medications   Medication Sig Start Date End Date Taking? Authorizing Provider  benzonatate (TESSALON) 200 MG capsule Take 1 capsule (200 mg total) by mouth 3 (three) times daily as needed for up to 7 days. 03/25/23 04/01/23 Yes Trevor Iha, FNP  doxycycline (VIBRAMYCIN) 100 MG capsule Take 1 capsule (100 mg total) by mouth 2 (two) times daily for 10 days. 03/25/23 04/04/23 Yes Trevor Iha, FNP  predniSONE (STERAPRED UNI-PAK 21 TAB) 10 MG (21) TBPK tablet Take by mouth daily. Take 6 tabs by mouth daily  for 2 days, then 5 tabs for 2 days, then 4 tabs for 2 days, then 3 tabs for 2 days, 2 tabs for 2 days, then 1 tab by mouth daily for 2 days 03/25/23  Yes Trevor Iha, FNP  acetaminophen (TYLENOL) 500 MG tablet Take 500-1,000 mg by mouth every 6 (six) hours as needed for moderate pain.    [provider]  amitriptyline (ELAVIL) 25 MG tablet TAKE 4 TABLETS BY MOUTH DAILY AT BEDTIME 01/15/23   Bradd Canary, MD  aspirin 81 MG EC tablet Take 81 mg by mouth daily.    [provider]  atorvastatin (LIPITOR) 20 MG  tablet Take 1 tablet by mouth in the morning and at bedtime. 02/08/23   Pricilla Riffle, MD  Biotin 5 MG CAPS Take 5 mg by mouth daily.    [provider]  Brimonidine Tartrate (LUMIFY) 0.025 % SOLN Place 1 drop into both eyes daily.    [provider]  capecitabine (XELODA) 150 MG tablet Take 600-900 mg by mouth See admin instructions. Take 900 mg by mouth in the morning and 600 mg in the evening daily every other week. 05/14/22   [provider]  Cholecalciferol (VITAMIN D3) 1000 UNITS CAPS Take 2,000 Units by mouth daily.    [provider]  COVID-19 mRNA vaccine [phone] (COMIRNATY) syringe Inject into the muscle. 01/09/23   Judyann Munson, MD  Cyanocobalamin (VITAMIN B 12 PO) Take 2 tablets by mouth daily.    [provider]  cyclobenzaprine (FLEXERIL) 10 MG tablet One half to one tab PO qHS, then increase gradually to one tab TID. 10/22/22   Monica Becton, MD  diclofenac Sodium (VOLTAREN ARTHRITIS PAIN) 1 % GEL Apply 2 g topically daily as needed (hip pain).    [provider]  diltiazem (CARDIZEM) 30 MG tablet TAKE 1 TABLET BY MOUTH DAILY FOR PALPITATIONS 06/04/22   Pricilla Riffle, MD  FLUoxetine (PROZAC) 40 MG capsule Take 1 capsule (40 mg total) by mouth daily. 02/25/23   Bradd Canary, MD  Folic Acid (FOLATE PO) Take 1 tablet by mouth daily.    [provider]  influenza vaccine adjuvanted (FLUAD QUADRIVALENT) 0.5 ML injection Inject into the muscle. 11/01/22   Judyann Munson, MD  ipratropium (ATROVENT) 0.06 % nasal spray Place 2 sprays into both nostrils 4 (four) times daily. 03/18/23   Domenick Gong, MD  loratadine (CLARITIN) 10 MG tablet Take 1 tablet (10 mg total) by mouth daily. 04/29/18   Bradd Canary, MD  LORazepam (ATIVAN) 1 MG tablet TAKE 1 TABLET(1 MG) BY MOUTH EVERY 8 HOURS AS NEEDED FOR ANXIETY 01/16/23   Bradd Canary, MD  pantoprazole (PROTONIX) 40 MG tablet TAKE 1 TABLET(40 MG) BY MOUTH TWICE DAILY BEFORE  A MEAL 02/07/23   [name], MD  pregabalin (LYRICA) 25 MG capsule 1 capsule p.o. nightly for a week then 1 capsule p.o. twice daily  for a week then 2 capsules p.o. twice daily for a week then 3 capsules p.o. twice daily, only increase dose if previous dose not effective 10/26/22   Monica Becton, MD  Pyridoxine HCl (B-6 PO) Take 1 tablet by mouth daily.    [provider]  Thiamine HCl (B-1 PO) Take 1 tablet by mouth daily.    [provider]    Family History Family History  Problem Relation Age of Onset   Other Mother        tic [place]   Heart disease Father    Lung cancer Father        smoker   Cirrhosis Sister        Primary Biliary   Anxiety disorder Sister    Osteoporosis Sister    Arthritis Sister        Rheumatoid   Osteoporosis Sister    Skin cancer Sister        multiple skin cancers, over 90 excisions.   Stroke Maternal Grandmother    Alcohol abuse Maternal Grandfather    Heart disease Paternal Grandfather    Esophageal cancer Paternal Grandfather    Rectal cancer Paternal Grandfather    Breast cancer Maternal Aunt        dx in her 56s   Breast cancer Maternal Aunt        dx in her 41s   Breast cancer Maternal Aunt        possible breast cancer dx and died in her 80s   Ovarian cancer Maternal Aunt 29   Pancreatic cancer Maternal Aunt        dx in her 86s   Pancreatic cancer Maternal Uncle        dx in his 38s; smoker   Stomach cancer Maternal Uncle    Cancer Maternal Uncle    Breast cancer Paternal Aunt        dx in her 4s   Lung cancer Paternal Aunt    Breast cancer Cousin        paternal first cousin   Breast cancer Cousin        maternal first cousin   Anesthesia problems Neg Hx    Hypotension Neg Hx    Malignant hyperthermia Neg Hx    Pseudochol deficiency Neg Hx     Social History Social History   Tobacco Use   Smoking status: Former    Packs/day: 1.00    Years: 50.00    Additional pack years: 0.00     Total pack years: 50.00    Types: Cigarettes    Quit date: 05/22/2007    Years since quitting: 15.8   Smokeless tobacco: Never  Vaping Use   Vaping Use: Never used  Substance Use Topics   Alcohol use: Not Currently   Drug use: No     Allergies   Sorbitol; Tomato; Zofran; Advil [ibuprofen]; Cephalexin; Diphenhydramine hcl; Morphine and related; Tramadol; Antihistamines, chlorpheniramine-type; and Oxycodone   Review of Systems Review of Systems  HENT:  Positive for congestion.   Respiratory:  Positive for cough and shortness of breath.   All other systems reviewed and are negative.    Physical Exam Triage Vital Signs ED Triage Vitals  Enc Vitals Group     BP 03/25/23 1325 106/66     Pulse Rate 03/25/23 1325 (!) 121     Resp 03/25/23 1325 19     Temp 03/25/23 1325 98.4 F (36.9 C)     Temp src --  SpO2 03/25/23 1325 98 %     Weight --      Height --      Head Circumference --      Peak Flow --      Pain Score 03/25/23 1324 0     Pain Loc --      Pain Edu? --      Excl. in GC? --    No data found.  Updated Vital Signs BP 106/66   Pulse (!) 121   Temp 98.4 F (36.9 C)   Resp 19   SpO2 98%      Physical Exam Vitals and nursing note reviewed.  Constitutional:      General: She is not in acute distress.    Appearance: Normal appearance. She is normal weight. She is not ill-appearing.  HENT:     Head: Normocephalic and atraumatic.     Right Ear: Tympanic membrane, ear canal and external ear normal.     Left Ear: Tympanic membrane, ear canal and external ear normal.     Mouth/Throat:     Mouth: Mucous membranes are moist.     Pharynx: Oropharynx is clear.  Eyes:     Extraocular Movements: Extraocular movements intact.     Conjunctiva/sclera: Conjunctivae normal.     Pupils: Pupils are equal, round, and reactive to light.  Cardiovascular:     Rate and Rhythm: Normal rate and regular rhythm.     Pulses: Normal pulses.     Heart sounds: Normal heart  sounds.  Pulmonary:     Effort: Pulmonary effort is normal.     Breath sounds: Normal breath sounds. No wheezing, rhonchi or rales.     Comments: Mildly diffuse scattered rhonchi with infrequent nonproductive cough noted on exam Genitourinary:    General: Normal vulva.     Rectum: Normal.  Musculoskeletal:        General: Normal range of motion.     Cervical back: Normal range of motion and neck supple.  Skin:    General: Skin is warm and dry.  Neurological:     General: No focal deficit present.     Mental Status: She is alert and oriented to person, place, and time.  Psychiatric:        Mood and Affect: Mood normal.        Behavior: Behavior normal.        Thought Content: Thought content normal.      UC Treatments / Results  Labs (all labs ordered are listed, but only abnormal results are displayed) Labs Reviewed - No data to display  EKG   Radiology DG Chest 2 View  Result Date: 03/25/2023 CLINICAL DATA:  Cough, shortness of breath EXAM: CHEST - 2 VIEW COMPARISON:  11/13/2022 FINDINGS: The heart size and mediastinal contours are within normal limits. Both lungs are clear. The visualized skeletal structures are unremarkable. IMPRESSION: No active cardiopulmonary disease. Electronically Signed   By: Elige Ko M.D.   On: 03/25/2023 13:39    Procedures Procedures (including critical care time)  Medications Ordered in UC Medications - No data to display  Initial Impression / Assessment and Plan / UC Course  I have reviewed the triage vital signs and the nursing notes.  Pertinent labs & imaging results that were available during my care of the patient were reviewed by me and considered in my medical decision making (see chart for details).     MDM: 1.  Cough, unspecified type-CXR revealed above, Rx'd Doxycycline  100 mg twice daily x 10 days, Tessalon 200 mg 3 times daily, as needed for cough; 2.  Bronchitis-Rx Sterapred Unipak (tapering from 60 to 10 mg over 10  days). Instructed patient to take medications as directed with food to completion.  Advised patient to take Prednisone with first dose of Doxycycline for the next 10 days.  Advised may take Tessalon daily or as needed for cough.  Encouraged to increase daily water intake to 64 ounces per day while taking these medications.  Advised if symptoms worsen and/or unresolved please follow-up with PCP or here for further evaluation.  Patient discharged home, hemodynamically stable. Final Clinical Impressions(s) / UC Diagnoses   Final diagnoses:  Bronchitis  Cough, unspecified type     Discharge Instructions      Instructed patient to take medications as directed with food to completion.  Advised patient to take Prednisone with first dose of Doxycycline for the next 10 days.  Advised may take Tessalon daily or as needed for cough.  Encouraged to increase daily water intake to 64 ounces per day while taking these medications.  Advised if symptoms worsen and/or unresolved please follow-up with PCP or here for further evaluation.     ED Prescriptions     Medication Sig Dispense Auth. Provider   doxycycline (VIBRAMYCIN) 100 MG capsule Take 1 capsule (100 mg total) by mouth 2 (two) times daily for 10 days. 20 capsule Trevor Iha, FNP   benzonatate (TESSALON) 200 MG capsule Take 1 capsule (200 mg total) by mouth 3 (three) times daily as needed for up to 7 days. 40 capsule Trevor Iha, FNP   predniSONE (STERAPRED UNI-PAK 21 TAB) 10 MG (21) TBPK tablet Take by mouth daily. Take 6 tabs by mouth daily  for 2 days, then 5 tabs for 2 days, then 4 tabs for 2 days, then 3 tabs for 2 days, 2 tabs for 2 days, then 1 tab by mouth daily for 2 days 42 tablet Trevor Iha, FNP      PDMP not reviewed this encounter.   Trevor Iha, FNP 03/25/23 1429

## 2023-03-25 NOTE — Discharge Instructions (Addendum)
Instructed patient to take medications as directed with food to completion.  Advised patient to take Prednisone with first dose of Doxycycline for the next 10 days.  Advised may take Tessalon daily or as needed for cough.  Encouraged to increase daily water intake to 64 ounces per day while taking these medications.  Advised if symptoms worsen and/or unresolved please follow-up with PCP or here for further evaluation.

## 2023-03-25 NOTE — ED Triage Notes (Signed)
Pt presents to [facility] with so who is reporting she is having sob, cough, pain with breathing for 1 week. Pt reports she had 88% this morning.

## 2023-04-02 ENCOUNTER — Encounter: Payer: Self-pay | Admitting: Family Medicine

## 2023-04-04 ENCOUNTER — Ambulatory Visit (INDEPENDENT_AMBULATORY_CARE_PROVIDER_SITE_OTHER): Payer: Medicare HMO | Admitting: Family Medicine

## 2023-04-04 ENCOUNTER — Other Ambulatory Visit: Payer: Self-pay

## 2023-04-04 VITALS — BP 118/64 | HR 104 | Temp 97.8°F | Resp 16 | Ht 65.0 in | Wt 139.0 lb

## 2023-04-04 DIAGNOSIS — G4700 Insomnia, unspecified: Secondary | ICD-10-CM | POA: Diagnosis not present

## 2023-04-04 DIAGNOSIS — R0989 Other specified symptoms and signs involving the circulatory and respiratory systems: Secondary | ICD-10-CM

## 2023-04-04 DIAGNOSIS — E782 Mixed hyperlipidemia: Secondary | ICD-10-CM

## 2023-04-04 DIAGNOSIS — R739 Hyperglycemia, unspecified: Secondary | ICD-10-CM

## 2023-04-04 MED ORDER — PROMETHAZINE-DM 6.25-15 MG/5ML PO SYRP: 5.0000 mL | ORAL_SOLUTION | Freq: Three times a day (TID) | ORAL | 0 refills | Status: DC | PRN

## 2023-04-04 MED ORDER — AMOXICILLIN 500 MG PO CAPS: 500.0000 mg | ORAL_CAPSULE | Freq: Three times a day (TID) | ORAL | 0 refills | Status: AC

## 2023-04-04 NOTE — Patient Instructions (Signed)

## 2023-04-04 NOTE — Addendum Note (Signed)
Addended by: Mervin Kung A on: 04/04/2023 04:13 PM   Modules accepted: Orders

## 2023-04-05 LAB — LIPID PANEL
Cholesterol: 143 mg/dL (ref 0–200)
HDL: 65.7 mg/dL (ref >39.00)
LDL Cholesterol: 44 mg/dL (ref 0–99)
NonHDL: 77.68
Total CHOL/HDL Ratio: 2
Triglycerides: 166 mg/dL — ABNORMAL HIGH (ref 0.0–149.0)
VLDL: 33.2 mg/dL (ref 0.0–40.0)

## 2023-04-05 LAB — CBC WITH DIFFERENTIAL/PLATELET
Basophils Absolute: 0.1 10*3/uL (ref 0.0–0.1)
Basophils Relative: 0.7 % (ref 0.0–3.0)
Eosinophils Absolute: 0.1 10*3/uL (ref 0.0–0.7)
Eosinophils Relative: 0.9 % (ref 0.0–5.0)
HCT: 35.7 % — ABNORMAL LOW (ref 36.0–46.0)
Hemoglobin: 11.7 g/dL — ABNORMAL LOW (ref 12.0–15.0)
Lymphocytes Relative: 11.4 % — ABNORMAL LOW (ref 12.0–46.0)
Lymphs Abs: 1.8 10*3/uL (ref 0.7–4.0)
MCHC: 32.7 g/dL (ref 30.0–36.0)
MCV: 95.3 fl (ref 78.0–100.0)
Monocytes Absolute: 1.1 10*3/uL — ABNORMAL HIGH (ref 0.1–1.0)
Monocytes Relative: 7.2 % (ref 3.0–12.0)
Neutro Abs: 12.3 10*3/uL — ABNORMAL HIGH (ref 1.4–7.7)
Neutrophils Relative %: 79.8 % — ABNORMAL HIGH (ref 43.0–77.0)
Platelets: 332 10*3/uL (ref 150.0–400.0)
RBC: 3.75 Mil/uL — ABNORMAL LOW (ref 3.87–5.11)
RDW: 23 % — ABNORMAL HIGH (ref 11.5–15.5)
WBC: 15.4 10*3/uL — ABNORMAL HIGH (ref 4.0–10.5)

## 2023-04-05 LAB — COMPREHENSIVE METABOLIC PANEL
ALT: 23 U/L (ref 0–35)
AST: 16 U/L (ref 0–37)
Albumin: 3.8 g/dL (ref 3.5–5.2)
Alkaline Phosphatase: 76 U/L (ref 39–117)
BUN: 23 mg/dL (ref 6–23)
CO2: 26 mEq/L (ref 19–32)
Calcium: 9.1 mg/dL (ref 8.4–10.5)
Chloride: 103 mEq/L (ref 96–112)
Creatinine, Ser: 1.06 mg/dL (ref 0.40–1.20)
GFR: 49.59 mL/min — ABNORMAL LOW (ref >60.00)
Glucose, Bld: 127 mg/dL — ABNORMAL HIGH (ref 70–99)
Potassium: 3.3 mEq/L — ABNORMAL LOW (ref 3.5–5.1)
Sodium: 140 mEq/L (ref 135–145)
Total Bilirubin: 0.4 mg/dL (ref 0.2–1.2)
Total Protein: 6.6 g/dL (ref 6.0–8.3)

## 2023-04-05 LAB — POC COVID19 BINAXNOW: SARS Coronavirus 2 Ag: NEGATIVE

## 2023-04-05 LAB — POCT INFLUENZA A/B
Influenza A, POC: NEGATIVE
Influenza B, POC: NEGATIVE

## 2023-04-05 LAB — HEMOGLOBIN A1C: Hgb A1c MFr Bld: 6 % (ref 4.6–6.5)

## 2023-04-08 ENCOUNTER — Encounter: Payer: Self-pay | Admitting: Family Medicine

## 2023-04-08 DIAGNOSIS — R0989 Other specified symptoms and signs involving the circulatory and respiratory systems: Secondary | ICD-10-CM | POA: Insufficient documentation

## 2023-04-08 NOTE — Assessment & Plan Note (Signed)
hgba1c acceptable, minimize simple carbs. Increase exercise as tolerated.  

## 2023-04-08 NOTE — Assessment & Plan Note (Signed)
Encouraged good sleep hygiene such as dark, quiet room. No blue/green glowing lights such as computer screens in bedroom. No alcohol or stimulants in evening. Cut down on caffeine as able. Regular exercise is helpful but not just prior to bed time.  

## 2023-04-08 NOTE — Progress Notes (Signed)
Subjective:    Patient ID: Anne [name], female    DOB: 03/05/1941, 82 y.o.   MRN: [id number]  Chief Complaint  Patient presents with   Follow-up    Follow up    HPI Patient is in today for follow up on chronic medical concerns and to have her respiratory symptoms evaluated. No recent hospitalizations. But she is noting her respiratory symptoms have worsened again. Head and chest congestion noted as well generalized malaise. No complaints of polyuria or polydipsia. Denies CP/palp/SOB/HA/fevers/GI or GU c/o. Taking meds as prescribed   Past Medical History:  Diagnosis Date   ALKALINE PHOSPHATASE, ELEVATED 03/15/2009   Allergic state 06/10/2012   Allergy    Anemia    Anxiety and depression 04/28/2011   Arthritis    Asthma    Atypical chest pain 11/30/2011   AVM (arteriovenous malformation) of colon 2011   cecum   Baker's cyst of knee 05/22/2011   Cancer (HCC) 11/2006   XRT/chemo 01-02/ lobular invasive ca   Carotid artery disease (HCC)    a. Carotid duplex 03/2014: stable 1-39% BICA, f/u due 03/2016.   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     Clotting disorder (HCC)    COPD (chronic obstructive pulmonary disease) (HCC)    Dementia (HCC)    Dermatitis 11/23/2012   Dyspnea    EE (eosinophilic esophagitis)    Emphysema of lung (HCC)    Esophageal ring    ESOPHAGEAL STRICTURE 03/29/2009   Fall 11/23/2012   Family history of breast cancer    Family history of colon cancer    Family history of ovarian cancer    Family history of pancreatic cancer    Folliculitis of nose 12/31/2011   GERD (gastroesophageal reflux disease) 09/29/2009   improved s/p cholecystectomy and esophagus dilatation   History of chicken pox    History of measles    History of shingles    2 episodes   Hx of echocardiogram    a. Echo 01/2013: mild LVH, EF 55-60%, normal wall motion, Gr 1 diast dysfn   Hyperlipidemia     Hypertension    Knee pain, bilateral 07/23/2011   Medicare annual wellness visit, subsequent 06/19/2015   Mixed hyperlipidemia 10/17/2010   Qualifier: Diagnosis of  By: Omar Person     Orthostasis    Osteopenia 03/14/2011   Osteoporosis    Personal history of chemotherapy 2001   Personal history of radiation therapy 2001   rt breast   PERSONAL HX BREAST CANCER 09/29/2009   Pneumonia    PVC's (premature ventricular contractions)    a. Event monitor 01/2013: NSR, extensive PVCs.   Radial neck fracture 10/2011   minimally displaced   Substance abuse (HCC)    In remission 3 years.    Urinary incontinence 03/19/2012   Vaginitis 05/22/2011    Past Surgical History:  Procedure Laterality Date   APPENDECTOMY     AUGMENTATION MAMMAPLASTY Right 05/27/2007   BREAST BIOPSY Right 01/02/2007   wire loc   BREAST BIOPSY  12/26/2006   BREAST LUMPECTOMY Right 2001   BREAST RECONSTRUCTION  2008, 2009, 2010   BREAST REDUCTION WITH MASTOPEXY Left 05/30/2017   Procedure: LEFT BREAST REDUCTION FOR SYMTERY WITH MASTOPEXY;  Surgeon: Peggye Form, DO;  Location: [facility];  Service: Plastics;  Laterality: Left;   CHOLECYSTECTOMY  2010   COLONOSCOPY  09/05/10   [facility]   DENTAL  SURGERY  05/2016   4 dental implants by Dr. Retta Mac.   ERCP  2010    CBD stone extraction    ESOPHAGOGASTRODUODENOSCOPY  01/08/2012   Procedure: ESOPHAGOGASTRODUODENOSCOPY (EGD);  Surgeon: [name], MD;  Location: [facility];  Service: Endoscopy;  Laterality: N/A;   ESOPHAGOGASTRODUODENOSCOPY (EGD) WITH ESOPHAGEAL DILATION  2010, 2012   LAPAROSCOPIC APPENDECTOMY N/A 08/04/2016   Procedure: APPENDECTOMY LAPAROSCOPIC;  Surgeon: Karie Soda, MD;  Location: [facility];  Service: General;  Laterality: N/A;   LIPOSUCTION WITH LIPOFILLING Left 11/21/2017   Procedure: LIPOSUCTION FROM ABDOMEN WITH LIPOFILLING TO LEFT BREAST;  Surgeon: Peggye Form, DO;  Location: [facility];   Service: Plastics;  Laterality: Left;   MASTECTOMY MODIFIED RADICAL Right 05/27/2007   , Mastectomy modified radical (08), breast reconstruction, CA lesions excised lateral abd wall 2010   MASTOPEXY Left 11/21/2017   Procedure: LEFT BREAST REVISION MASTOPEXY FOR SYMMETRY;  Surgeon: Peggye Form, DO;  Location: [facility];  Service: Plastics;  Laterality: Left;   REDUCTION MAMMAPLASTY Left    SAVORY DILATION  01/08/2012   Procedure: SAVORY DILATION;  Surgeon: [name], MD;  Location: [facility];  Service: Endoscopy;  Laterality: N/A;  need xray    Family History  Problem Relation Age of Onset   Other Mother        tic [place]   Heart disease Father    Lung cancer Father        smoker   Cirrhosis Sister        Primary Biliary   Anxiety disorder Sister    Osteoporosis Sister    Arthritis Sister        Rheumatoid   Osteoporosis Sister    Skin cancer Sister        multiple skin cancers, over 90 excisions.   Stroke Maternal Grandmother    Alcohol abuse Maternal Grandfather    Heart disease Paternal Grandfather    Esophageal cancer Paternal Grandfather    Rectal cancer Paternal Grandfather    Breast cancer Maternal Aunt        dx in her 36s   Breast cancer Maternal Aunt        dx in her 62s   Breast cancer Maternal Aunt        possible breast cancer dx and died in her 77s   Ovarian cancer Maternal Aunt 29   Pancreatic cancer Maternal Aunt        dx in her 35s   Pancreatic cancer Maternal Uncle        dx in his 68s; smoker   Stomach cancer Maternal Uncle    Cancer Maternal Uncle    Breast cancer Paternal Aunt        dx in her 8s   Lung cancer Paternal Aunt    Breast cancer Cousin        paternal first cousin   Breast cancer Cousin        maternal first cousin   Anesthesia problems Neg Hx    Hypotension Neg Hx    Malignant hyperthermia Neg Hx    Pseudochol deficiency Neg Hx     Social History   Socioeconomic History   Marital status:  Married    Spouse name: Sam   Number of children: 3   Years of education: Not on file   Highest education level: Not on file  Occupational History   Occupation: Arboriculturist  Tobacco Use   Smoking status: Former  Packs/day: 1.00    Years: 50.00    Additional pack years: 0.00    Total pack years: 50.00    Types: Cigarettes    Quit date: 05/22/2007    Years since quitting: 15.8   Smokeless tobacco: Never  Vaping Use   Vaping Use: Never used  Substance and Sexual Activity   Alcohol use: Not Currently   Drug use: No   Sexual activity: Yes    Partners: Male    Comment: lives with husband,   Other Topics Concern   Not on file  Social History Narrative   Not on file   Social Determinants of Health   Financial Resource Strain: Low Risk  (02/27/2022)   Overall Financial Resource Strain (CARDIA)    Difficulty of Paying Living Expenses: Not hard at all  Food Insecurity: No Food Insecurity (03/22/2023)   Hunger Vital Sign    Worried About Running Out of Food in the Last Year: Never true    Ran Out of Food in the Last Year: Never true  Transportation Needs: No Transportation Needs (03/22/2023)   PRAPARE - Administrator, Civil Service (Medical): No    Lack of Transportation (Non-Medical): No  Physical Activity: Sufficiently Active (02/27/2022)   Exercise Vital Sign    Days of Exercise per Week: 7 days    Minutes of Exercise per Session: 60 min  Stress: No Stress Concern Present (02/27/2022)   [employer] of Occupational Health - Occupational Stress Questionnaire    Feeling of Stress : Not at all  Social Connections: Moderately Integrated (02/27/2022)   Social Connection and Isolation Panel [NHANES]    Frequency of Communication with Friends and Family: More than three times a week    Frequency of Social Gatherings with Friends and Family: More than three times a week    Attends Religious Services: More than 4 times per year    Active Member of [employer] or  Organizations: No    Attends Banker Meetings: Never    Marital Status: Married  Catering manager Violence: Not At Risk (03/22/2023)   Humiliation, Afraid, Rape, and Kick questionnaire    Fear of Current or Ex-Partner: No    Emotionally Abused: No    Physically Abused: No    Sexually Abused: No    Outpatient Medications Prior to Visit  Medication Sig Dispense Refill   acetaminophen (TYLENOL) 500 MG tablet Take 500-1,000 mg by mouth every 6 (six) hours as needed for moderate pain.     amitriptyline (ELAVIL) 25 MG tablet TAKE 4 TABLETS BY MOUTH DAILY AT BEDTIME 180 tablet 5   aspirin 81 MG EC tablet Take 81 mg by mouth daily.     atorvastatin (LIPITOR) 20 MG tablet Take 1 tablet by mouth in the morning and at bedtime. 180 tablet 3   Biotin 5 MG CAPS Take 5 mg by mouth daily.     Brimonidine Tartrate (LUMIFY) 0.025 % SOLN Place 1 drop into both eyes daily.     capecitabine (XELODA) 150 MG tablet Take 600-900 mg by mouth See admin instructions. Take 900 mg by mouth in the morning and 600 mg in the evening daily every other week.     Cholecalciferol (VITAMIN D3) 1000 UNITS CAPS Take 2,000 Units by mouth daily.     COVID-19 mRNA vaccine 2023-2024 (COMIRNATY) syringe Inject into the muscle. 0.3 mL 0   Cyanocobalamin (VITAMIN B 12 PO) Take 2 tablets by mouth daily.     cyclobenzaprine (FLEXERIL)  10 MG tablet One half to one tab PO qHS, then increase gradually to one tab TID. 30 tablet 0   diclofenac Sodium (VOLTAREN ARTHRITIS PAIN) 1 % GEL Apply 2 g topically daily as needed (hip pain).     diltiazem (CARDIZEM) 30 MG tablet TAKE 1 TABLET BY MOUTH DAILY FOR PALPITATIONS 90 tablet 3   doxycycline (VIBRAMYCIN) 100 MG capsule Take 1 capsule (100 mg total) by mouth 2 (two) times daily for 10 days. 20 capsule 0   FLUoxetine (PROZAC) 40 MG capsule Take 1 capsule (40 mg total) by mouth daily. 30 capsule 0   Folic Acid (FOLATE PO) Take 1 tablet by mouth daily.     influenza vaccine adjuvanted  (FLUAD QUADRIVALENT) 0.5 ML injection Inject into the muscle. 0.5 mL 0   ipratropium (ATROVENT) 0.06 % nasal spray Place 2 sprays into both nostrils 4 (four) times daily. 15 mL 0   loratadine (CLARITIN) 10 MG tablet Take 1 tablet (10 mg total) by mouth daily. 30 tablet 11   LORazepam (ATIVAN) 1 MG tablet TAKE 1 TABLET(1 MG) BY MOUTH EVERY 8 HOURS AS NEEDED FOR ANXIETY 90 tablet 2   pantoprazole (PROTONIX) 40 MG tablet TAKE 1 TABLET(40 MG) BY MOUTH TWICE DAILY BEFORE A MEAL 180 tablet 0   pregabalin (LYRICA) 25 MG capsule 1 capsule p.o. nightly for a week then 1 capsule p.o. twice daily for a week then 2 capsules p.o. twice daily for a week then 3 capsules p.o. twice daily, only increase dose if previous dose not effective 90 capsule 3   Pyridoxine HCl (B-6 PO) Take 1 tablet by mouth daily.     Thiamine HCl (B-1 PO) Take 1 tablet by mouth daily.     predniSONE (STERAPRED UNI-PAK 21 TAB) 10 MG (21) TBPK tablet Take by mouth daily. Take 6 tabs by mouth daily  for 2 days, then 5 tabs for 2 days, then 4 tabs for 2 days, then 3 tabs for 2 days, 2 tabs for 2 days, then 1 tab by mouth daily for 2 days 42 tablet 0   No facility-administered medications prior to visit.    Allergies  Allergen Reactions   Sorbitol Other (See Comments)    GI Issues   Tomato Diarrhea   Zofran Other (See Comments)    headache   Advil [Ibuprofen] Other (See Comments)    Irritates throat.   Cephalexin Other (See Comments)    dizziness   Diphenhydramine Hcl Other (See Comments)    "nervous and upset" does okay w/ cream   Morphine And Codeine Other (See Comments)    Causes depression   Tramadol Other (See Comments)    Dizziness   Antihistamines, Chlorpheniramine-Type Anxiety   Oxycodone Anxiety    Confusion with inability to think clearly    Review of Systems  Constitutional:  Positive for malaise/fatigue. Negative for fever.  HENT:  Positive for congestion.   Eyes:  Negative for blurred vision.  Respiratory:   Positive for cough and sputum production. Negative for hemoptysis and shortness of breath.   Cardiovascular:  Negative for chest pain, palpitations and leg swelling.  Gastrointestinal:  Negative for abdominal pain, blood in stool and nausea.  Genitourinary:  Negative for dysuria and frequency.  Musculoskeletal:  Negative for falls.  Skin:  Negative for rash.  Neurological:  Negative for dizziness, loss of consciousness and headaches.  Endo/Heme/Allergies:  Negative for environmental allergies.  Psychiatric/Behavioral:  Negative for depression. The patient is not nervous/anxious.  Objective:    Physical Exam Constitutional:      General: She is not in acute distress.    Appearance: Normal appearance. She is well-developed. She is not toxic-appearing.  HENT:     Head: Normocephalic and atraumatic.     Right Ear: External ear normal.     Left Ear: External ear normal.     Nose: Nose normal.  Eyes:     General:        Right eye: No discharge.        Left eye: No discharge.     Conjunctiva/sclera: Conjunctivae normal.  Neck:     Thyroid: No thyromegaly.  Cardiovascular:     Rate and Rhythm: Normal rate and regular rhythm.     Heart sounds: Normal heart sounds. No murmur heard. Pulmonary:     Effort: Pulmonary effort is normal. No respiratory distress.     Breath sounds: Normal breath sounds.  Abdominal:     General: Bowel sounds are normal.     Palpations: Abdomen is soft.     Tenderness: There is no abdominal tenderness. There is no guarding.  Musculoskeletal:        General: Normal range of motion.     Cervical back: Neck supple.  Lymphadenopathy:     Cervical: No cervical adenopathy.  Skin:    General: Skin is warm and dry.  Neurological:     Mental Status: She is alert and oriented to person, place, and time.  Psychiatric:        Mood and Affect: Mood normal.        Behavior: Behavior normal.        Thought Content: Thought content normal.        Judgment:  Judgment normal.     BP 118/64 (BP Location: Right Arm, Patient Position: Sitting, Cuff Size: Normal)   Pulse (!) 104   Temp 97.8 F (36.6 C) (Oral)   Resp 16   Ht 5\' 5"  (1.651 m)   Wt 139 lb (63 kg)   SpO2 95%   BMI 23.13 kg/m  Wt Readings from Last 3 Encounters:  04/04/23 139 lb (63 kg)  03/22/23 139 lb (63 kg)  03/18/23 137 lb (62.1 kg)    Diabetic Foot Exam - Simple   No data filed    Lab Results  Component Value Date   WBC 15.4 (H) 04/04/2023   HGB 11.7 (L) 04/04/2023   HCT 35.7 (L) 04/04/2023   PLT 332.0 04/04/2023   GLUCOSE 127 (H) 04/04/2023   CHOL 143 04/04/2023   TRIG 166.0 (H) 04/04/2023   HDL 65.70 04/04/2023   LDLDIRECT 117.4 01/13/2013   LDLCALC 44 04/04/2023   ALT 23 04/04/2023   AST 16 04/04/2023   NA 140 04/04/2023   K 3.3 (L) 04/04/2023   CL 103 04/04/2023   CREATININE 1.06 04/04/2023   BUN 23 04/04/2023   CO2 26 04/04/2023   TSH 0.71 08/29/2022   INR 1.02 08/12/2017   HGBA1C 6.0 04/04/2023    Lab Results  Component Value Date   TSH 0.71 08/29/2022   Lab Results  Component Value Date   WBC 15.4 (H) 04/04/2023   HGB 11.7 (L) 04/04/2023   HCT 35.7 (L) 04/04/2023   MCV 95.3 04/04/2023   PLT 332.0 04/04/2023   Lab Results  Component Value Date   NA 140 04/04/2023   K 3.3 (L) 04/04/2023   CHLORIDE 104 09/04/2017   CO2 26 04/04/2023   GLUCOSE 127 (H) 04/04/2023  BUN 23 04/04/2023   CREATININE 1.06 04/04/2023   BILITOT 0.4 04/04/2023   ALKPHOS 76 04/04/2023   AST 16 04/04/2023   ALT 23 04/04/2023   PROT 6.6 04/04/2023   ALBUMIN 3.8 04/04/2023   CALCIUM 9.1 04/04/2023   ANIONGAP 9 10/22/2021   EGFR >60 09/04/2017   GFR 49.59 (L) 04/04/2023   Lab Results  Component Value Date   CHOL 143 04/04/2023   Lab Results  Component Value Date   HDL 65.70 04/04/2023   Lab Results  Component Value Date   LDLCALC 44 04/04/2023   Lab Results  Component Value Date   TRIG 166.0 (H) 04/04/2023   Lab Results  Component Value  Date   CHOLHDL 2 04/04/2023   Lab Results  Component Value Date   HGBA1C 6.0 04/04/2023       Assessment & Plan:  Chest congestion Assessment & Plan: She was feeling better after a few doses of Doxycycline but she began to have side effects so she stopped it and now her symptoms are worse again. Will start on Amoxicillin, mucinex and Encouraged increased rest and hydration, add probiotics.. Treat fevers as needed   Orders: -     DG Chest 2 View; Future -     POCT Influenza A/B -     POC COVID-19 BinaxNow  Mixed hyperlipidemia Assessment & Plan: Encourage heart healthy diet such as MIND or DASH diet, increase exercise, avoid trans fats, simple carbohydrates and processed foods, consider a krill or fish or flaxseed oil cap daily.    Orders: -     Lipid panel  Hyperglycemia Assessment & Plan: hgba1c acceptable, minimize simple carbs. Increase exercise as tolerated.   Orders: -     Hemoglobin A1c -     Comprehensive metabolic panel -     CBC with Differential/Platelet  Insomnia, unspecified type Assessment & Plan: Encouraged good sleep hygiene such as dark, quiet room. No blue/green glowing lights such as computer screens in bedroom. No alcohol or stimulants in evening. Cut down on caffeine as able. Regular exercise is helpful but not just prior to bed time.     Other orders -     Promethazine-DM; Take 5 mLs by mouth 3 (three) times daily as needed for cough.  Dispense: 180 mL; Refill: 0 -     Amoxicillin; Take 1 capsule (500 mg total) by mouth 3 (three) times daily for 10 days.  Dispense: 30 capsule; Refill: 0    [name], MD

## 2023-04-08 NOTE — Assessment & Plan Note (Signed)
Encourage heart healthy diet such as MIND or DASH diet, increase exercise, avoid trans fats, simple carbohydrates and processed foods, consider a krill or fish or flaxseed oil cap daily.  °

## 2023-04-08 NOTE — Assessment & Plan Note (Signed)
She was feeling better after a few doses of Doxycycline but she began to have side effects so she stopped it and now her symptoms are worse again. Will start on Amoxicillin, mucinex and Encouraged increased rest and hydration, add probiotics.. Treat fevers as needed

## 2023-04-09 ENCOUNTER — Other Ambulatory Visit: Payer: Self-pay

## 2023-04-09 ENCOUNTER — Other Ambulatory Visit: Payer: Self-pay | Admitting: Family Medicine

## 2023-04-09 DIAGNOSIS — E782 Mixed hyperlipidemia: Secondary | ICD-10-CM

## 2023-04-09 DIAGNOSIS — R739 Hyperglycemia, unspecified: Secondary | ICD-10-CM

## 2023-04-09 DIAGNOSIS — F322 Major depressive disorder, single episode, severe without psychotic features: Secondary | ICD-10-CM

## 2023-04-09 NOTE — Addendum Note (Signed)
Addended by: Kathi Ludwig on: 04/09/2023 11:21 AM   Modules accepted: Orders

## 2023-04-15 DIAGNOSIS — M545 Low back pain, unspecified: Secondary | ICD-10-CM | POA: Diagnosis not present

## 2023-04-17 ENCOUNTER — Ambulatory Visit (INDEPENDENT_AMBULATORY_CARE_PROVIDER_SITE_OTHER): Payer: Medicare HMO | Admitting: Sports Medicine

## 2023-04-17 ENCOUNTER — Ambulatory Visit (INDEPENDENT_AMBULATORY_CARE_PROVIDER_SITE_OTHER): Payer: Medicare HMO

## 2023-04-17 DIAGNOSIS — M47816 Spondylosis without myelopathy or radiculopathy, lumbar region: Secondary | ICD-10-CM

## 2023-04-17 DIAGNOSIS — M545 Low back pain, unspecified: Secondary | ICD-10-CM | POA: Diagnosis not present

## 2023-04-17 MED ORDER — PREGABALIN 25 MG PO CAPS: ORAL_CAPSULE | ORAL | 3 refills | Status: DC

## 2023-04-17 NOTE — Assessment & Plan Note (Addendum)
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 She was doing some stretches recently, had an increase in pain and was seen by an urgent care, given a Medrol Dosepak, Robaxin, no imaging done. She is having intolerable insomnia with a Medrol Dosepak in spite of lorazepam and amitriptyline at night. Robaxin not helping. At this point we will get aggressive we will go and proceed with a left-sided L4-L5 interlaminar epidural, I would also like her to consider starting the Lyrica. Return to see me 4 weeks after the shot.  Update: For appeal purposes delaying this procedure will place on do morbidity, excessive pain and loss of function on the patient.  I have requested an expedited appeal.

## 2023-04-17 NOTE — Progress Notes (Addendum)
    Procedures performed today:    None.  Independent interpretation of notes and tests performed by another provider:   None.  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 She was doing some stretches recently, had an increase in pain and was seen by an urgent care, given a Medrol Dosepak, Robaxin, no imaging done. She is having intolerable insomnia with a Medrol Dosepak in spite of lorazepam and amitriptyline at night. Robaxin not helping. At this point we will get aggressive we will go and proceed with a left-sided L4-L5 interlaminar epidural, I would also like her to consider starting the Lyrica. Return to see me 4 weeks after the shot.  Update: For appeal purposes delaying this procedure will place on do morbidity, excessive pain and loss of function on the patient.  I have requested an expedited appeal.    ____________________________________________ Ihor Austin. Benjamin Stain, M.D., ABFM., CAQSM., AME. Primary Care and Sports Medicine [facility]  Adjunct Professor of Family Medicine  [employer]  Restaurant manager, fast food

## 2023-04-22 ENCOUNTER — Encounter: Payer: Self-pay | Admitting: Sports Medicine

## 2023-04-22 ENCOUNTER — Encounter: Payer: Self-pay | Admitting: Family Medicine

## 2023-04-23 ENCOUNTER — Encounter: Payer: Self-pay | Admitting: Sports Medicine

## 2023-04-23 DIAGNOSIS — M1711 Unilateral primary osteoarthritis, right knee: Secondary | ICD-10-CM

## 2023-04-23 MED ORDER — CELECOXIB 100 MG PO CAPS
100.0000 mg | ORAL_CAPSULE | Freq: Every day | ORAL | 3 refills | Status: DC
Start: 2023-04-23 — End: 2023-05-08

## 2023-04-24 ENCOUNTER — Other Ambulatory Visit (INDEPENDENT_AMBULATORY_CARE_PROVIDER_SITE_OTHER): Payer: Medicare HMO

## 2023-04-24 DIAGNOSIS — R739 Hyperglycemia, unspecified: Secondary | ICD-10-CM | POA: Diagnosis not present

## 2023-04-24 LAB — COMPREHENSIVE METABOLIC PANEL
ALT: 17 U/L (ref 0–35)
AST: 18 U/L (ref 0–37)
Albumin: 3.8 g/dL (ref 3.5–5.2)
Alkaline Phosphatase: 67 U/L (ref 39–117)
BUN: 13 mg/dL (ref 6–23)
CO2: 29 mEq/L (ref 19–32)
Calcium: 8.8 mg/dL (ref 8.4–10.5)
Chloride: 105 mEq/L (ref 96–112)
Creatinine, Ser: 0.72 mg/dL (ref 0.40–1.20)
GFR: 78.85 mL/min (ref >60.00)
Glucose, Bld: 99 mg/dL (ref 70–99)
Potassium: 3.7 mEq/L (ref 3.5–5.1)
Sodium: 141 mEq/L (ref 135–145)
Total Bilirubin: 0.4 mg/dL (ref 0.2–1.2)
Total Protein: 6.6 g/dL (ref 6.0–8.3)

## 2023-04-25 ENCOUNTER — Other Ambulatory Visit: Payer: Medicare HMO

## 2023-04-27 ENCOUNTER — Encounter: Payer: Self-pay | Admitting: Family Medicine

## 2023-04-29 ENCOUNTER — Other Ambulatory Visit: Payer: Medicare HMO

## 2023-04-30 DIAGNOSIS — C792 Secondary malignant neoplasm of skin: Secondary | ICD-10-CM | POA: Diagnosis not present

## 2023-04-30 DIAGNOSIS — R21 Rash and other nonspecific skin eruption: Secondary | ICD-10-CM | POA: Diagnosis not present

## 2023-04-30 DIAGNOSIS — M8580 Other specified disorders of bone density and structure, unspecified site: Secondary | ICD-10-CM | POA: Diagnosis not present

## 2023-04-30 DIAGNOSIS — C50911 Malignant neoplasm of unspecified site of right female breast: Secondary | ICD-10-CM | POA: Diagnosis not present

## 2023-05-03 ENCOUNTER — Encounter: Payer: Self-pay | Admitting: Sports Medicine

## 2023-05-06 ENCOUNTER — Telehealth: Payer: Self-pay

## 2023-05-06 NOTE — Telephone Encounter (Signed)
Sherri from [facility] called and left a message stating the epidural needs a peer to peer.   [employer] [phone]

## 2023-05-07 NOTE — Progress Notes (Unsigned)
Cardiology Office Note   Date:  05/08/2023   ID:  Anne [name] 07/25/1941, MRN [id number]  PCP:  Bradd Canary, MD  Cardiologist:   Dietrich Pates, MD   Pt presents for f/u of dizziness and palpitations.    History of Present Illness: Anne [name] is a 82 y.o. female with a history of orthostatic intolerance, carotid stenosis, HL, breat CA, stage IV  COPD, anemia, GERD, eosinophilic esophagitis, Echo 3/14: Mild LVH, EF 55-60%, grade 1 diastolic dysfunction. Event monitor 01/2013: NSR, extensive PVC, CV dz, palpitations.    Myoview 4/14: Low risk, no scar or ischemia, not gated.  She has been treated with low dose metoprolol for PVCs and palps.     I saw the pt in [facility] in May 2023  Pt notes some fatigue / dragging over past week   Also complains of join paints Continue to get chemo for metaswtatic breast CA    Tamoxifen now      Current Meds  Medication Sig   acetaminophen (TYLENOL) 500 MG tablet Take 500-1,000 mg by mouth every 6 (six) hours as needed for moderate pain.   amitriptyline (ELAVIL) 25 MG tablet TAKE 4 TABLETS BY MOUTH DAILY AT BEDTIME   aspirin 81 MG EC tablet Take 81 mg by mouth daily.   atorvastatin (LIPITOR) 20 MG tablet Take 1 tablet by mouth in the morning and at bedtime.   Biotin 5 MG CAPS Take 5 mg by mouth daily.   Brimonidine Tartrate (LUMIFY) 0.025 % SOLN Place 1 drop into both eyes daily.   capecitabine (XELODA) 150 MG tablet Take 600-900 mg by mouth See admin instructions. Take 900 mg by mouth in the morning and 600 mg in the evening daily every other week.   Cholecalciferol (VITAMIN D3) 1000 UNITS CAPS Take 2,000 Units by mouth daily.   COVID-19 mRNA vaccine 2023-2024 (COMIRNATY) syringe Inject into the muscle.   Cyanocobalamin (VITAMIN B 12 PO) Take 2 tablets by mouth daily.   diclofenac Sodium (VOLTAREN ARTHRITIS PAIN) 1 % GEL Apply 2 g topically daily as needed (hip pain).   diltiazem (CARDIZEM) 30 MG tablet TAKE 1 TABLET BY MOUTH DAILY FOR  PALPITATIONS   FLUoxetine (PROZAC) 40 MG capsule Take 1 capsule (40 mg total) by mouth daily. Needs appt   Folic Acid (FOLATE PO) Take 1 tablet by mouth daily.   influenza vaccine adjuvanted (FLUAD QUADRIVALENT) 0.5 ML injection Inject into the muscle.   ipratropium (ATROVENT) 0.06 % nasal spray Place 2 sprays into both nostrils 4 (four) times daily.   nystatin-triamcinolone ointment (MYCOLOG) Apply topically 3 (three) times daily.   pantoprazole (PROTONIX) 40 MG tablet TAKE 1 TABLET(40 MG) BY MOUTH TWICE DAILY BEFORE A MEAL   Pyridoxine HCl (B-6 PO) Take 1 tablet by mouth daily.   Thiamine HCl (B-1 PO) Take 1 tablet by mouth daily.     Allergies:   Sorbitol; Tomato; Zofran; Advil [ibuprofen]; Cephalexin; Diphenhydramine hcl; Morphine and codeine; Tramadol; Antihistamines, chlorpheniramine-type; and Oxycodone   Past Medical History:  Diagnosis Date   ALKALINE PHOSPHATASE, ELEVATED 03/15/2009   Allergic state 06/10/2012   Allergy    Anemia    Anxiety and depression 04/28/2011   Arthritis    Asthma    Atypical chest pain 11/30/2011   AVM (arteriovenous malformation) of colon 2011   cecum   Baker's cyst of knee 05/22/2011   Cancer (HCC) 11/2006   XRT/chemo 01-02/ lobular invasive ca   Carotid artery disease (HCC)  a. Carotid duplex 03/2014: stable 1-39% BICA, f/u due 03/2016.   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     Clotting disorder (HCC)    COPD (chronic obstructive pulmonary disease) (HCC)    Dementia (HCC)    Dermatitis 11/23/2012   Dyspnea    EE (eosinophilic esophagitis)    Emphysema of lung (HCC)    Esophageal ring    ESOPHAGEAL STRICTURE 03/29/2009   Fall 11/23/2012   Family history of breast cancer    Family history of colon cancer    Family history of ovarian cancer    Family history of pancreatic cancer    Folliculitis of nose 12/31/2011   GERD (gastroesophageal reflux disease)  09/29/2009   improved s/p cholecystectomy and esophagus dilatation   History of chicken pox    History of measles    History of shingles    2 episodes   Hx of echocardiogram    a. Echo 01/2013: mild LVH, EF 55-60%, normal wall motion, Gr 1 diast dysfn   Hyperlipidemia    Hypertension    Knee pain, bilateral 07/23/2011   Medicare annual wellness visit, subsequent 06/19/2015   Mixed hyperlipidemia 10/17/2010   Qualifier: Diagnosis of  By: Omar Person     Orthostasis    Osteopenia 03/14/2011   Osteoporosis    Personal history of chemotherapy 2001   Personal history of radiation therapy 2001   rt breast   PERSONAL HX BREAST CANCER 09/29/2009   Pneumonia    PVC's (premature ventricular contractions)    a. Event monitor 01/2013: NSR, extensive PVCs.   Radial neck fracture 10/2011   minimally displaced   Substance abuse (HCC)    In remission 3 years.    Urinary incontinence 03/19/2012   Vaginitis 05/22/2011    Past Surgical History:  Procedure Laterality Date   APPENDECTOMY     AUGMENTATION MAMMAPLASTY Right 05/27/2007   BREAST BIOPSY Right 01/02/2007   wire loc   BREAST BIOPSY  12/26/2006   BREAST LUMPECTOMY Right 2001   BREAST RECONSTRUCTION  2008, 2009, 2010   BREAST REDUCTION WITH MASTOPEXY Left 05/30/2017   Procedure: LEFT BREAST REDUCTION FOR SYMTERY WITH MASTOPEXY;  Surgeon: Peggye Form, DO;  Location: [facility];  Service: Plastics;  Laterality: Left;   CHOLECYSTECTOMY  2010   COLONOSCOPY  09/05/10   [facility]   DENTAL SURGERY  05/2016   4 dental implants by Dr. Retta Mac.   ERCP  2010    CBD stone extraction    ESOPHAGOGASTRODUODENOSCOPY  01/08/2012   Procedure: ESOPHAGOGASTRODUODENOSCOPY (EGD);  Surgeon: [name], MD;  Location: [facility];  Service: Endoscopy;  Laterality: N/A;   ESOPHAGOGASTRODUODENOSCOPY (EGD) WITH ESOPHAGEAL DILATION  2010, 2012   LAPAROSCOPIC APPENDECTOMY N/A 08/04/2016   Procedure: APPENDECTOMY  LAPAROSCOPIC;  Surgeon: Karie Soda, MD;  Location: [facility];  Service: General;  Laterality: N/A;   LIPOSUCTION WITH LIPOFILLING Left 11/21/2017   Procedure: LIPOSUCTION FROM ABDOMEN WITH LIPOFILLING TO LEFT BREAST;  Surgeon: Peggye Form, DO;  Location: [facility];  Service: Plastics;  Laterality: Left;   MASTECTOMY MODIFIED RADICAL Right 05/27/2007   , Mastectomy modified radical (08), breast reconstruction, CA lesions excised lateral abd wall 2010   MASTOPEXY Left 11/21/2017   Procedure: LEFT BREAST REVISION MASTOPEXY FOR SYMMETRY;  Surgeon: Peggye Form, DO;  Location: [facility];  Service: Plastics;  Laterality: Left;   REDUCTION MAMMAPLASTY Left  SAVORY DILATION  01/08/2012   Procedure: SAVORY DILATION;  Surgeon: [name], MD;  Location: [facility];  Service: Endoscopy;  Laterality: N/A;  need xray     Social History:  The patient  reports that she quit smoking about 15 years ago. Her smoking use included cigarettes. She has a 50.00 pack-year smoking history. She has never used smokeless tobacco. She reports that she does not currently use alcohol. She reports that she does not use drugs.   Family History:  The patient's family history includes Alcohol abuse in her maternal grandfather; Anxiety disorder in her sister; Arthritis in her sister; Breast cancer in her cousin, cousin, maternal aunt, maternal aunt, maternal aunt, and paternal aunt; Cancer in her maternal uncle; Cirrhosis in her sister; Esophageal cancer in her paternal grandfather; Heart disease in her father and paternal grandfather; Lung cancer in her father and paternal aunt; Osteoporosis in her sister and sister; Other in her mother; Ovarian cancer (age of onset: 11) in her maternal aunt; Pancreatic cancer in her maternal aunt and maternal uncle; Rectal cancer in her paternal grandfather; Skin cancer in her sister; Stomach cancer in her maternal uncle; Stroke in her maternal  grandmother.    ROS:  Please see the history of present illness. All other systems are reviewed and  Negative to the above problem except as noted.    PHYSICAL EXAM: VS:  BP 110/64   Pulse 95   Ht 5\' 5"  (1.651 m)   Wt 137 lb (62.1 kg)   SpO2 95%   BMI 22.80 kg/m   GEN:  Thin 82 yo  in no acute distress  HEENT: normal  Neck: no JVD, no carotid bruit Cardiac: RRR; no murmurs  No LE  edema  Respiratory:  clear to auscultation bilaterally   GI: soft, nontender No hepatomegaly    EKG:  EKG is not ordered today.  Echo  2021  1. Left ventricular ejection fraction, by estimation, is 60 to 65%. The  left ventricle has normal function. The left ventricle has no regional  wall motion abnormalities. Left ventricular diastolic parameters are  consistent with Grade I diastolic  dysfunction (impaired relaxation). The average left ventricular global  longitudinal strain is -19.8 %. The global longitudinal strain is normal.   2. Right ventricular systolic function is normal. The right ventricular  size is normal.   3. The mitral valve is normal in structure. Trivial mitral valve  regurgitation. No evidence of mitral stenosis.   4. The aortic valve is normal in structure. Aortic valve regurgitation is  not visualized. No aortic stenosis is present.   5. The inferior vena cava is normal in size with greater than 50%  respiratory variability, suggesting right atrial pressure of 3 mmHg.   Comparison(s): 02/28/18 EF 55-60%.   Carotid USN   2017  Heterogeneous plaque, bilaterally. Stable 1-39% bilateral ICA stenosis. Normal subclavian arteries, bilaterally. Patent vertebral arteries with antegrade flow. f/u PRN. Lipid Panel    Component Value Date/Time   CHOL 143 04/04/2023 1613   TRIG 166.0 (H) 04/04/2023 1613   HDL 65.70 04/04/2023 1613   CHOLHDL 2 04/04/2023 1613   VLDL 33.2 04/04/2023 1613   LDLCALC 44 04/04/2023 1613   LDLDIRECT 117.4 01/13/2013 1210      Wt Readings from  Last 3 Encounters:  05/08/23 137 lb (62.1 kg)  04/04/23 139 lb (63 kg)  03/22/23 139 lb (63 kg)      ASSESSMENT AND PLAN:  1  Hx palpitations  Pt denies   2  Fatigue   PT and her husband are both fatigued   I do not think cardiac   ? Subclinical infection   Conservative Rx    Rest   3 Hx of dizziness  Pt denies   4  CV dz   Mild   keep on statin     5  HL  LDL 44  HDL 66 in may 2024   Continue statin    6  Hx Breast CA  Follows in oncology at [facility]  Current medicines are reviewed at length with the patient today.  The patient does not have concerns regarding medicines.  Signed, Dietrich Pates, MD  05/08/2023 4:10 PM    [facility] [address] [place], [place], [place]  [address] Phone: [phone]; Fax: [phone]

## 2023-05-07 NOTE — Telephone Encounter (Signed)
Case# [id number] Member ID: [id number] [employer]: [phone] Appeal for denied auth  I have gone through the process, I have initiated the expedited appeal, I have also faxed the additional required paperwork to the expedited appeal line fax [phone], appeal [id number].

## 2023-05-08 ENCOUNTER — Ambulatory Visit: Payer: Medicare HMO | Attending: Internal Medicine | Admitting: Internal Medicine

## 2023-05-08 ENCOUNTER — Encounter: Payer: Self-pay | Admitting: Internal Medicine

## 2023-05-08 VITALS — BP 110/64 | HR 95 | Ht 65.0 in | Wt 137.0 lb

## 2023-05-08 DIAGNOSIS — R002 Palpitations: Secondary | ICD-10-CM | POA: Diagnosis not present

## 2023-05-08 NOTE — Telephone Encounter (Signed)
Ok got it approved. Auth: [id number]  6/26-12/26/2024

## 2023-05-08 NOTE — Patient Instructions (Signed)
Medication Instructions:  Your physician recommends that you continue on your current medications as directed. Please refer to the Current Medication list given to you today.  *If you need a refill on your cardiac medications before your next appointment, please call your pharmacy*  Lab Work: None ordered today.  Testing/Procedures: None ordered today.  Follow-Up: At [facility], you and your health needs are our priority.  As part of our continuing mission to provide you with exceptional heart care, we have created designated Provider Care Teams.  These Care Teams include your primary Cardiologist (physician) and Advanced Practice Providers (APPs -  Physician Assistants and Nurse Practitioners) who all work together to provide you with the care you need, when you need it.  Your next appointment:   1 year(s)  The format for your next appointment:   In Person  Provider:   Paula Ross, MD { 

## 2023-05-09 NOTE — Telephone Encounter (Signed)
Task completed. Spoke to [place] at [employer]. Auth & expiration information provided. Patient will be contacted for scheduling.

## 2023-05-14 ENCOUNTER — Other Ambulatory Visit: Payer: Self-pay | Admitting: Family Medicine

## 2023-05-14 DIAGNOSIS — F322 Major depressive disorder, single episode, severe without psychotic features: Secondary | ICD-10-CM

## 2023-05-18 ENCOUNTER — Ambulatory Visit
Admission: EM | Admit: 2023-05-18 | Discharge: 2023-05-18 | Disposition: A | Discharge status: Home / Self Care | Payer: Medicare HMO | Attending: Family Medicine | Admitting: Family Medicine

## 2023-05-18 DIAGNOSIS — W57XXXA Bitten or stung by nonvenomous insect and other nonvenomous arthropods, initial encounter: Secondary | ICD-10-CM | POA: Diagnosis not present

## 2023-05-18 DIAGNOSIS — S2096XA Insect bite (nonvenomous) of unspecified parts of thorax, initial encounter: Secondary | ICD-10-CM

## 2023-05-18 MED ORDER — TRIAMCINOLONE ACETONIDE 0.1 % EX CREA: 1.0000 | TOPICAL_CREAM | Freq: Two times a day (BID) | CUTANEOUS | 0 refills | Status: DC

## 2023-05-18 NOTE — ED Provider Notes (Signed)
[facility]    CSN: [id number] Arrival date & time: 05/18/23  1533      History   Chief Complaint Chief Complaint  Patient presents with   Insect Bite    HPI Anne [name] is a 82 y.o. female.   HPI  Multiple bites on trunk patient thinks are bedbugs Has no recent travel or house guests Was outside one day this week Bites itch moderately No new medicines, foods or lotions or soap  Past Medical History:  Diagnosis Date   ALKALINE PHOSPHATASE, ELEVATED 03/15/2009   Allergic state 06/10/2012   Allergy    Anemia    Anxiety and depression 04/28/2011   Arthritis    Asthma    Atypical chest pain 11/30/2011   AVM (arteriovenous malformation) of colon 2011   cecum   Baker's cyst of knee 05/22/2011   Cancer (HCC) 11/2006   XRT/chemo 01-02/ lobular invasive ca   Carotid artery disease (HCC)    a. Carotid duplex 03/2014: stable 1-39% BICA, f/u due 03/2016.   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     Clotting disorder (HCC)    COPD (chronic obstructive pulmonary disease) (HCC)    Dementia (HCC)    Dermatitis 11/23/2012   Dyspnea    EE (eosinophilic esophagitis)    Emphysema of lung (HCC)    Esophageal ring    ESOPHAGEAL STRICTURE 03/29/2009   Fall 11/23/2012   Family history of breast cancer    Family history of colon cancer    Family history of ovarian cancer    Family history of pancreatic cancer    Folliculitis of nose 12/31/2011   GERD (gastroesophageal reflux disease) 09/29/2009   improved s/p cholecystectomy and esophagus dilatation   History of chicken pox    History of measles    History of shingles    2 episodes   Hx of echocardiogram    a. Echo 01/2013: mild LVH, EF 55-60%, normal wall motion, Gr 1 diast dysfn   Hyperlipidemia    Hypertension    Knee pain, bilateral 07/23/2011   Medicare annual wellness visit, subsequent 06/19/2015   Mixed hyperlipidemia  10/17/2010   Qualifier: Diagnosis of  By: Omar Person     Orthostasis    Osteopenia 03/14/2011   Osteoporosis    Personal history of chemotherapy 2001   Personal history of radiation therapy 2001   rt breast   PERSONAL HX BREAST CANCER 09/29/2009   Pneumonia    PVC's (premature ventricular contractions)    a. Event monitor 01/2013: NSR, extensive PVCs.   Radial neck fracture 10/2011   minimally displaced   Substance abuse (HCC)    In remission 3 years.    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   Facial trauma, sequela 08/09/2019   Hyperglycemia 08/19/2018   Mild intermittent asthma without complication 06/17/2018   Asthma 04/30/2018   Macular degeneration, 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  Palpitations 04/30/2018   Skin lesion of face 02/07/2018   Lumbar spondylosis 09/23/2017   Hypokalemia 08/11/2017   Abdominal pain 08/11/2017   SBO (small bowel obstruction) (HCC) 08/10/2017   History of right breast cancer 03/21/2017   Genetic testing 09/13/2016   Family history of breast cancer    Family history of pancreatic cancer    Family history of colon cancer    Pain in the chest 05/16/2016   Status post right breast reconstruction 05/11/2016   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) 05/11/2016   History of breast cancer in female 05/11/2016   Osteopenia determined by x-ray 05/11/2016   IBS (irritable bowel syndrome) 03/09/2016   Dyspnea  06/19/2015   Medicare annual wellness visit, subsequent 06/19/2015   Urinary frequency 12/12/2014   Breast cancer, right breast (HCC) 10/18/2014   Superficial thrombophlebitis 03/16/2014   Plant dermatitis 03/16/2014   Chest wall pain 02/07/2014   Primary osteoarthritis of both hips 01/21/2014   Elevated sed rate 08/02/2013   Dermatitis 11/23/2012   Falls 11/23/2012   Preventative health care 10/21/2012   Allergy 06/10/2012   Urinary incontinence 03/19/2012   Anemia 12/21/2011   Baker's cyst of knee 05/22/2011   Eosinophilic esophagitis 05/11/2011   Anxiety and depression 04/28/2011   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   Constipation, chronic 03/15/2011   Mixed hyperlipidemia 10/17/2010   CAROTID ARTERY STENOSIS 01/13/2010   GERD 09/29/2009   PERSONAL HX BREAST CANCER 09/29/2009    Past Surgical History:  Procedure Laterality Date   APPENDECTOMY     AUGMENTATION MAMMAPLASTY Right 05/27/2007   BREAST BIOPSY Right 01/02/2007   wire loc   BREAST BIOPSY  12/26/2006   BREAST LUMPECTOMY Right 2001   BREAST RECONSTRUCTION  2008, 2009, 2010   BREAST REDUCTION WITH MASTOPEXY Left 05/30/2017   Procedure: LEFT BREAST REDUCTION FOR SYMTERY WITH MASTOPEXY;  Surgeon: Peggye Form, DO;  Location: [facility];  Service: Plastics;  Laterality: Left;   CHOLECYSTECTOMY  2010   COLONOSCOPY  09/05/10   [facility]   DENTAL SURGERY  05/2016   4 dental implants by Dr. Retta Mac.   ERCP  2010    CBD stone extraction    ESOPHAGOGASTRODUODENOSCOPY  01/08/2012   Procedure: ESOPHAGOGASTRODUODENOSCOPY (EGD);  Surgeon: [name], MD;  Location: [facility];  Service: Endoscopy;  Laterality: N/A;   ESOPHAGOGASTRODUODENOSCOPY (EGD) WITH ESOPHAGEAL DILATION  2010, 2012   LAPAROSCOPIC APPENDECTOMY N/A 08/04/2016   Procedure: APPENDECTOMY LAPAROSCOPIC;  Surgeon: Karie Soda, MD;  Location: [facility];  Service: General;  Laterality: N/A;   LIPOSUCTION WITH LIPOFILLING  Left 11/21/2017   Procedure: LIPOSUCTION FROM ABDOMEN WITH LIPOFILLING TO LEFT BREAST;  Surgeon: Peggye Form, DO;  Location: [facility];  Service: Plastics;  Laterality: Left;   MASTECTOMY MODIFIED RADICAL Right 05/27/2007   , Mastectomy modified radical (08), breast reconstruction, CA lesions excised lateral abd wall 2010   MASTOPEXY Left 11/21/2017   Procedure: LEFT BREAST REVISION MASTOPEXY FOR SYMMETRY;  Surgeon: Peggye Form, DO;  Location: [facility];  Service: Plastics;  Laterality: Left;   REDUCTION MAMMAPLASTY Left    SAVORY DILATION  01/08/2012   Procedure: SAVORY DILATION;  Surgeon: [name], MD;  Location: [facility];  Service: Endoscopy;  Laterality: N/A;  need xray    OB History   No obstetric history on file.      Home Medications    Prior to Admission medications   Medication Sig Start Date  End Date Taking? Authorizing Provider  triamcinolone cream (KENALOG) 0.1 % Apply 1 Application topically 2 (two) times daily. 05/18/23  Yes Eustace Moore, MD  acetaminophen (TYLENOL) 500 MG tablet Take 500-1,000 mg by mouth every 6 (six) hours as needed for moderate pain.    [provider]  amitriptyline (ELAVIL) 25 MG tablet TAKE 4 TABLETS BY MOUTH DAILY AT BEDTIME 01/15/23   Bradd Canary, MD  aspirin 81 MG EC tablet Take 81 mg by mouth daily.    [provider]  atorvastatin (LIPITOR) 20 MG tablet Take 1 tablet by mouth in the morning and at bedtime. 02/08/23   Pricilla Riffle, MD  Biotin 5 MG CAPS Take 5 mg by mouth daily.    [provider]  Brimonidine Tartrate (LUMIFY) 0.025 % SOLN Place 1 drop into both eyes daily.    [provider]  capecitabine (XELODA) 150 MG tablet Take 600-900 mg by mouth See admin instructions. Take 900 mg by mouth in the morning and 600 mg in the evening daily every other week. 05/14/22   [provider]  Cholecalciferol (VITAMIN D3) 1000 UNITS CAPS Take  2,000 Units by mouth daily.    [provider]  COVID-19 mRNA vaccine [phone] (COMIRNATY) syringe Inject into the muscle. 01/09/23   Judyann Munson, MD  Cyanocobalamin (VITAMIN B 12 PO) Take 2 tablets by mouth daily.    [provider]  diclofenac Sodium (VOLTAREN ARTHRITIS PAIN) 1 % GEL Apply 2 g topically daily as needed (hip pain).    [provider]  diltiazem (CARDIZEM) 30 MG tablet TAKE 1 TABLET BY MOUTH DAILY FOR PALPITATIONS 06/04/22   Pricilla Riffle, MD  FLUoxetine (PROZAC) 40 MG capsule Take 1 capsule (40 mg total) by mouth daily. 05/14/23   Bradd Canary, MD  Folic Acid (FOLATE PO) Take 1 tablet by mouth daily.    [provider]  influenza vaccine adjuvanted (FLUAD QUADRIVALENT) 0.5 ML injection Inject into the muscle. 11/01/22   Judyann Munson, MD  ipratropium (ATROVENT) 0.06 % nasal spray Place 2 sprays into both nostrils 4 (four) times daily. 03/18/23   Domenick Gong, MD  nystatin-triamcinolone ointment [facility]) Apply topically 3 (three) times daily. 05/01/23   [provider]  pantoprazole (PROTONIX) 40 MG tablet TAKE 1 TABLET(40 MG) BY MOUTH TWICE DAILY BEFORE A MEAL 02/07/23   [name], MD  Pyridoxine HCl (B-6 PO) Take 1 tablet by mouth daily.    [provider]  Thiamine HCl (B-1 PO) Take 1 tablet by mouth daily.    [provider]    Family History Family History  Problem Relation Age of Onset   Other Mother        tic [place]   Heart disease Father    Lung cancer Father        smoker   Cirrhosis Sister        Primary Biliary   Anxiety disorder Sister    Osteoporosis Sister    Arthritis Sister        Rheumatoid   Osteoporosis Sister    Skin cancer Sister        multiple skin cancers, over 90 excisions.   Stroke Maternal Grandmother    Alcohol abuse Maternal Grandfather    Heart disease Paternal Grandfather    Esophageal cancer Paternal Grandfather    Rectal cancer Paternal Grandfather     Breast cancer Maternal Aunt        dx in her  40s   Breast cancer Maternal Aunt        dx in her 34s   Breast cancer Maternal Aunt        possible breast cancer dx and died in her 24s   Ovarian cancer Maternal Aunt 29   Pancreatic cancer Maternal Aunt        dx in her 60s   Pancreatic cancer Maternal Uncle        dx in his 51s; smoker   Stomach cancer Maternal Uncle    Cancer Maternal Uncle    Breast cancer Paternal Aunt        dx in her 2s   Lung cancer Paternal Aunt    Breast cancer Cousin        paternal first cousin   Breast cancer Cousin        maternal first cousin   Anesthesia problems Neg Hx    Hypotension Neg Hx    Malignant hyperthermia Neg Hx    Pseudochol deficiency Neg Hx     Social History Social History   Tobacco Use   Smoking status: Former    Packs/day: 1.00    Years: 50.00    Additional pack years: 0.00    Total pack years: 50.00    Types: Cigarettes    Quit date: 05/22/2007    Years since quitting: 16.0   Smokeless tobacco: Never  Vaping Use   Vaping Use: Never used  Substance Use Topics   Alcohol use: Not Currently   Drug use: No     Allergies   Sorbitol; Tomato; Zofran; Advil [ibuprofen]; Cephalexin; Diphenhydramine hcl; Morphine and codeine; Tramadol; Antihistamines, chlorpheniramine-type; and Oxycodone   Review of Systems Review of Systems  See HPI Physical Exam Triage Vital Signs ED Triage Vitals [05/18/23 1547]  Enc Vitals Group     BP 125/79     Pulse Rate 91     Resp 18     Temp 98.3 F (36.8 C)     Temp Source Oral     SpO2 95 %     Weight      Height      Head Circumference      Peak Flow      Pain Score 6     Pain Loc      Pain Edu?      Excl. in GC?    No data found.  Updated Vital Signs BP 125/79 (BP Location: Left Arm)   Pulse 91   Temp 98.3 F (36.8 C) (Oral)   Resp 18   SpO2 95%      Physical Exam Constitutional:      General: She is not in acute distress.    Appearance: She is well-developed  and normal weight.  HENT:     Head: Normocephalic and atraumatic.  Eyes:     Conjunctiva/sclera: Conjunctivae normal.     Pupils: Pupils are equal, round, and reactive to light.  Cardiovascular:     Rate and Rhythm: Normal rate.  Pulmonary:     Effort: Pulmonary effort is normal. No respiratory distress.  Abdominal:     General: There is no distension.     Palpations: Abdomen is soft.  Musculoskeletal:        General: Normal range of motion.     Cervical back: Normal range of motion.  Skin:    General: Skin is warm and dry.     Comments: Multiple erythematous papules 4-5 mm across, a few with central  vesiculation on chest and back, few on thighs  Neurological:     Mental Status: She is alert.      UC Treatments / Results  Labs (all labs ordered are listed, but only abnormal results are displayed) Labs Reviewed - No data to display  EKG   Radiology No results found.  Procedures Procedures (including critical care time)  Medications Ordered in UC Medications - No data to display  Initial Impression / Assessment and Plan / UC Course  I have reviewed the triage vital signs and the nursing notes.  Pertinent labs & imaging results that were available during my care of the patient were reviewed by me and considered in my medical decision making (see chart for details).     Insect bite - unclear type.  Could be bedbugs or possilbly chiggers.   Final Clinical Impressions(s) / UC Diagnoses   Final diagnoses:  Insect bite of thoracic wall, unspecified whether front or back, initial encounter     Discharge Instructions      Multiple insect bites that resemble bedbugs Fumigate bedroom with bug spray Clean all linens Apply triamcinolone cream May take antihistamines like zyrtec for itching     ED Prescriptions     Medication Sig Dispense Auth. Provider   triamcinolone cream (KENALOG) 0.1 % Apply 1 Application topically 2 (two) times daily. 30 g Eustace Moore, MD      PDMP not reviewed this encounter.   Eustace Moore, MD 05/18/23 [id number]

## 2023-05-18 NOTE — Discharge Instructions (Signed)
Multiple insect bites that resemble bedbugs Fumigate bedroom with bug spray Clean all linens Apply triamcinolone cream May take antihistamines like zyrtec for itching

## 2023-05-18 NOTE — ED Triage Notes (Signed)
Pt c/o possible generalized insect bites that started to appear about 3 days ago.   Home interventions: cortisone cream

## 2023-05-23 DIAGNOSIS — H35711 Central serous chorioretinopathy, right eye: Secondary | ICD-10-CM | POA: Diagnosis not present

## 2023-05-23 DIAGNOSIS — H353132 Nonexudative age-related macular degeneration, bilateral, intermediate dry stage: Secondary | ICD-10-CM | POA: Diagnosis not present

## 2023-05-23 DIAGNOSIS — H353211 Exudative age-related macular degeneration, right eye, with active choroidal neovascularization: Secondary | ICD-10-CM | POA: Diagnosis not present

## 2023-06-04 ENCOUNTER — Other Ambulatory Visit: Payer: Self-pay | Admitting: Internal Medicine

## 2023-06-10 ENCOUNTER — Ambulatory Visit
Admission: RE | Admit: 2023-06-10 | Discharge: 2023-06-10 | Disposition: A | Discharge status: Home / Self Care | Payer: Medicare HMO | Source: Ambulatory Visit | Attending: Sports Medicine | Admitting: Sports Medicine

## 2023-06-10 DIAGNOSIS — M47816 Spondylosis without myelopathy or radiculopathy, lumbar region: Secondary | ICD-10-CM

## 2023-06-10 DIAGNOSIS — M545 Low back pain, unspecified: Secondary | ICD-10-CM | POA: Diagnosis not present

## 2023-06-10 MED ORDER — METHYLPREDNISOLONE ACETATE 40 MG/ML INJ SUSP (RADIOLOG
80.0000 mg | Freq: Once | INTRAMUSCULAR | Status: AC
  Administered 2023-06-10: 80 mg via EPIDURAL

## 2023-06-10 MED ORDER — IOPAMIDOL (ISOVUE-M 200) INJECTION 41%
1.0000 mL | Freq: Once | INTRAMUSCULAR | Status: AC
  Administered 2023-06-10: 1 mL via EPIDURAL

## 2023-06-10 NOTE — Discharge Instructions (Signed)
Post Procedure Spinal Discharge Instruction Sheet  You may resume a regular diet and any medications that you routinely take (including pain medications) unless otherwise noted by MD.  No driving day of procedure.  Light activity throughout the rest of the day.  Do not do any strenuous work, exercise, bending or lifting.  The day following the procedure, you can resume normal physical activity but you should refrain from exercising or physical therapy for at least three days thereafter.  You may apply ice to the injection site, 20 minutes on, 20 minutes off, as needed. Do not apply ice directly to skin.    Common Side Effects:  Headaches- take your usual medications as directed by your physician.  Increase your fluid intake.  Caffeinated beverages may be helpful.  Lie flat in bed until your headache resolves.  Restlessness or inability to sleep- you may have trouble sleeping for the next few days.  Ask your referring physician if you need any medication for sleep.  Facial flushing or redness- should subside within a few days.  Increased pain- a temporary increase in pain a day or two following your procedure is not unusual.  Take your pain medication as prescribed by your referring physician.  Leg cramps  Please contact our office at [phone] for the following symptoms: Fever greater than 100 degrees. Headaches unresolved with medication after 2-3 days. Increased swelling, pain, or redness at injection site.  YOU MAY RESUME YOUR ASPIRIN TODAY, POST PROCEDURE.  Thank you for visiting [facility] today.

## 2023-06-17 DIAGNOSIS — C792 Secondary malignant neoplasm of skin: Secondary | ICD-10-CM | POA: Diagnosis not present

## 2023-06-17 DIAGNOSIS — C50911 Malignant neoplasm of unspecified site of right female breast: Secondary | ICD-10-CM | POA: Diagnosis not present

## 2023-07-05 ENCOUNTER — Other Ambulatory Visit: Payer: Self-pay | Admitting: Family Medicine

## 2023-07-05 DIAGNOSIS — S299XXA Unspecified injury of thorax, initial encounter: Secondary | ICD-10-CM

## 2023-07-09 ENCOUNTER — Encounter: Payer: Medicare HMO | Admitting: Psychology

## 2023-07-10 ENCOUNTER — Other Ambulatory Visit: Payer: Self-pay | Admitting: Family Medicine

## 2023-07-10 ENCOUNTER — Encounter: Payer: Self-pay | Admitting: Family Medicine

## 2023-07-10 DIAGNOSIS — S299XXA Unspecified injury of thorax, initial encounter: Secondary | ICD-10-CM

## 2023-07-11 NOTE — Telephone Encounter (Signed)
Requesting: Lorazepam 1 mg  Looks like Rx was d/c on 05/06/23 " pt preference"   Contract: none UDS: 02/01/22 Last Visit: 04/04/23 Next Visit: none pending  Last Refill: 01/16/2023  Please Advise

## 2023-07-12 ENCOUNTER — Encounter: Payer: Self-pay | Admitting: Psychology

## 2023-07-12 ENCOUNTER — Other Ambulatory Visit: Payer: Self-pay | Admitting: Family Medicine

## 2023-07-12 ENCOUNTER — Telehealth: Payer: Self-pay | Admitting: Family Medicine

## 2023-07-12 ENCOUNTER — Other Ambulatory Visit: Payer: Self-pay

## 2023-07-12 DIAGNOSIS — S299XXA Unspecified injury of thorax, initial encounter: Secondary | ICD-10-CM

## 2023-07-12 MED ORDER — LORAZEPAM 1 MG PO TABS
ORAL_TABLET | ORAL | 1 refills | Status: DC
Start: 2023-07-12 — End: 2023-11-19

## 2023-07-12 NOTE — Telephone Encounter (Signed)
Pt's husband called to check on status of lorazapam refill that was requested two days ago. Pt has been out for several days, routed as high priority due to pt concern.

## 2023-07-22 DIAGNOSIS — N6489 Other specified disorders of breast: Secondary | ICD-10-CM | POA: Diagnosis not present

## 2023-07-22 DIAGNOSIS — C50911 Malignant neoplasm of unspecified site of right female breast: Secondary | ICD-10-CM | POA: Diagnosis not present

## 2023-07-22 DIAGNOSIS — R9431 Abnormal electrocardiogram [ECG] [EKG]: Secondary | ICD-10-CM | POA: Diagnosis not present

## 2023-07-22 DIAGNOSIS — Z79899 Other long term (current) drug therapy: Secondary | ICD-10-CM | POA: Diagnosis not present

## 2023-07-22 DIAGNOSIS — R296 Repeated falls: Secondary | ICD-10-CM | POA: Diagnosis not present

## 2023-07-22 DIAGNOSIS — Z6821 Body mass index (BMI) 21.0-21.9, adult: Secondary | ICD-10-CM | POA: Diagnosis not present

## 2023-07-22 DIAGNOSIS — I4510 Unspecified right bundle-branch block: Secondary | ICD-10-CM | POA: Diagnosis not present

## 2023-07-22 DIAGNOSIS — R634 Abnormal weight loss: Secondary | ICD-10-CM | POA: Diagnosis not present

## 2023-07-22 DIAGNOSIS — C7989 Secondary malignant neoplasm of other specified sites: Secondary | ICD-10-CM | POA: Diagnosis not present

## 2023-07-22 DIAGNOSIS — M8580 Other specified disorders of bone density and structure, unspecified site: Secondary | ICD-10-CM | POA: Diagnosis not present

## 2023-07-22 DIAGNOSIS — G893 Neoplasm related pain (acute) (chronic): Secondary | ICD-10-CM | POA: Diagnosis not present

## 2023-07-24 ENCOUNTER — Other Ambulatory Visit: Payer: Self-pay | Admitting: Internal Medicine

## 2023-07-30 ENCOUNTER — Encounter: Payer: Self-pay | Admitting: Internal Medicine

## 2023-08-14 DIAGNOSIS — M899 Disorder of bone, unspecified: Secondary | ICD-10-CM | POA: Diagnosis not present

## 2023-08-14 DIAGNOSIS — C50911 Malignant neoplasm of unspecified site of right female breast: Secondary | ICD-10-CM | POA: Diagnosis not present

## 2023-08-14 DIAGNOSIS — G893 Neoplasm related pain (acute) (chronic): Secondary | ICD-10-CM | POA: Diagnosis not present

## 2023-08-14 DIAGNOSIS — G9589 Other specified diseases of spinal cord: Secondary | ICD-10-CM | POA: Diagnosis not present

## 2023-08-14 DIAGNOSIS — R296 Repeated falls: Secondary | ICD-10-CM | POA: Diagnosis not present

## 2023-08-14 DIAGNOSIS — C792 Secondary malignant neoplasm of skin: Secondary | ICD-10-CM | POA: Diagnosis not present

## 2023-08-14 DIAGNOSIS — Z79899 Other long term (current) drug therapy: Secondary | ICD-10-CM | POA: Diagnosis not present

## 2023-08-14 DIAGNOSIS — N6489 Other specified disorders of breast: Secondary | ICD-10-CM | POA: Diagnosis not present

## 2023-08-14 DIAGNOSIS — M8580 Other specified disorders of bone density and structure, unspecified site: Secondary | ICD-10-CM | POA: Diagnosis not present

## 2023-08-23 ENCOUNTER — Encounter: Payer: Self-pay | Admitting: Internal Medicine

## 2023-08-26 ENCOUNTER — Other Ambulatory Visit: Payer: Self-pay | Admitting: Internal Medicine

## 2023-08-26 ENCOUNTER — Ambulatory Visit: Payer: Medicare HMO | Admitting: Internal Medicine

## 2023-08-27 ENCOUNTER — Ambulatory Visit: Payer: Medicare HMO | Attending: Cardiology | Admitting: Cardiology

## 2023-08-27 ENCOUNTER — Other Ambulatory Visit: Payer: Self-pay | Admitting: Family Medicine

## 2023-08-27 ENCOUNTER — Encounter: Payer: Self-pay | Admitting: Cardiology

## 2023-08-27 VITALS — BP 116/78 | HR 92 | Ht 65.0 in | Wt 127.6 lb

## 2023-08-27 DIAGNOSIS — I493 Ventricular premature depolarization: Secondary | ICD-10-CM

## 2023-08-27 DIAGNOSIS — E782 Mixed hyperlipidemia: Secondary | ICD-10-CM

## 2023-08-27 DIAGNOSIS — R002 Palpitations: Secondary | ICD-10-CM | POA: Diagnosis not present

## 2023-08-27 DIAGNOSIS — F322 Major depressive disorder, single episode, severe without psychotic features: Secondary | ICD-10-CM

## 2023-08-27 DIAGNOSIS — I951 Orthostatic hypotension: Secondary | ICD-10-CM

## 2023-08-27 MED ORDER — DILTIAZEM HCL 30 MG PO TABS: ORAL_TABLET | ORAL | 1 refills | Status: AC

## 2023-08-27 NOTE — Progress Notes (Signed)
Cardiology Office Note:  .   Date:  08/27/2023  ID:  Anne [name], Anne [name] 05/31/1941, MRN [id number] PCP: Anne Canary, MD  [facility] Providers Cardiologist:  Anne Pates, MD   History of Present Illness: Anne [name] Kitchen   Anne [name] is a 82 y.o. female with a past medical history of orthostatic intolerance, carotid stenosis, stage IV breast cancer, COPD, hyperlipidemia, anemia, GERD, eosinophilic esophagitis.  Patient is followed by Dr. Tenny [name] and presents today for evaluation of lower extremity changes.  Patient previously underwent echocardiogram in 01/2013 that showed mild LVH, EF 55-60%, grade 1 diastolic dysfunction.  Also wore an event monitor in 01/2013 that showed normal sinus rhythm, extensive PVCs.  Underwent nuclear stress test in 02/2013 that was a low risk study without evidence of scar or ischemia.  She was treated with low-dose metoprolol for PVCs, palpitations.  Underwent carotid ultrasounds in 03/2016 that showed 1-39% bilateral ICA stenosis.  Recommended that studies be completed as needed.  Echocardiogram in 02/2018 showed EF 55-60%, no regional wall motion abnormalities, grade 1 DD.  Most recent echocardiogram from 06/2020 showed EF 60-65%, no regional wall motion abnormalities, grade 1 diastolic dysfunction, normal RV function.  In 01/2021, patient reported that she had been diagnosed with stage IV breast cancer and was being followed at [name].  Patient was last seen by Dr. Tenny [name] on 05/08/2023.  At that time, patient was on chemotherapy for metastatic breast cancer with tamoxifen.  She noted some fatigue over the past week.  Patient's palpitations were well-controlled.  Remained on beta-blocker, statin therapy.  Today, patient reports that she has been having episodes of dizziness/lightheadedness when she stands up. She likes to work in her garden, and sits on the ground to do so. When she stands upright, she is able to take few steps but then feels very dizzy. She also notices  dizziness when standing up after sitting in a chair. She denies syncope. She sometimes has discomfort in her chest under her sternum. Discomfort is difficult for her to describe, and she says it "just doesn't feel right." Occurs once every few weeks but lasts all day. Is relieved by working in her garden or robatussin. She denies chest pain/discomfort on exertion. She denies palpitations, orthopnea, shortness of breath, ankle edema. Denies claudication. Notices that her legs seem to tan very quickly when she is in the sun, thought to be a side effect of her chemotherapy.  ROS: See HPI, otherwise negative   Studies Reviewed: .   Cardiac Studies & Procedures       ECHOCARDIOGRAM  ECHOCARDIOGRAM COMPLETE 06/22/2020  Narrative ECHOCARDIOGRAM REPORT    Patient Name:   Anne [name] Date of Exam: 06/22/2020 Medical Rec #:  [id number]       Height:       65.5 in Accession #:    [id number]      Weight:       141.7 lb Date of Birth:  [birth year]      BSA:          1.718 m Patient Age:    78 years        BP:           110/64 mmHg Patient Gender: F               HR:           94 bpm. Exam Location:  [address]  Procedure: 2D Echo, 3D Echo, Cardiac Doppler, Color Doppler and Strain Analysis  Indications:    R06.02 Shortness of breath  History:        Patient has prior history of Echocardiogram examinations, most recent 02/28/2018. Carotid Disease, Arrythmias:Palpitation, Signs/Symptoms:Shortness of Breath; Risk Factors:Former Smoker. Emphysema. Breast cancer. Anemia.  Sonographer:    Anne [name], RDCS Referring Phys: [id number] Anne [name]  IMPRESSIONS   1. Left ventricular ejection fraction, by estimation, is 60 to 65%. The left ventricle has normal function. The left ventricle has no regional wall motion abnormalities. Left ventricular diastolic parameters are consistent with Grade I diastolic dysfunction (impaired relaxation). The average left ventricular global longitudinal  strain is -19.8 %. The global longitudinal strain is normal. 2. Right ventricular systolic function is normal. The right ventricular size is normal. 3. The mitral valve is normal in structure. Trivial mitral valve regurgitation. No evidence of mitral stenosis. 4. The aortic valve is normal in structure. Aortic valve regurgitation is not visualized. No aortic stenosis is present. 5. The inferior vena cava is normal in size with greater than 50% respiratory variability, suggesting right atrial pressure of 3 mmHg.  Comparison(s): 02/28/18 EF 55-60%.  FINDINGS Left Ventricle: Left ventricular ejection fraction, by estimation, is 60 to 65%. The left ventricle has normal function. The left ventricle has no regional wall motion abnormalities. The average left ventricular global longitudinal strain is -19.8 %. The global longitudinal strain is normal. The left ventricular internal cavity size was normal in size. There is no left ventricular hypertrophy. Left ventricular diastolic parameters are consistent with Grade I diastolic dysfunction (impaired relaxation).  Right Ventricle: The right ventricular size is normal. No increase in right ventricular wall thickness. Right ventricular systolic function is normal.  Left Atrium: Left atrial size was normal in size.  Right Atrium: Right atrial size was normal in size.  Pericardium: There is no evidence of pericardial effusion.  Mitral Valve: The mitral valve is normal in structure. Normal mobility of the mitral valve leaflets. Trivial mitral valve regurgitation. No evidence of mitral valve stenosis.  Tricuspid Valve: The tricuspid valve is normal in structure. Tricuspid valve regurgitation is trivial. No evidence of tricuspid stenosis.  Aortic Valve: The aortic valve is normal in structure. Aortic valve regurgitation is not visualized. No aortic stenosis is present.  Pulmonic Valve: The pulmonic valve was normal in structure. Pulmonic valve  regurgitation is not visualized. No evidence of pulmonic stenosis.  Aorta: The aortic root is normal in size and structure.  Venous: The inferior vena cava is normal in size with greater than 50% respiratory variability, suggesting right atrial pressure of 3 mmHg.  IAS/Shunts: No atrial level shunt detected by color flow Doppler.   LEFT VENTRICLE PLAX 2D LVIDd:         3.70 cm  Diastology LVIDs:         2.30 cm  LV e' lateral:   5.68 cm/s LV PW:         1.00 cm  LV E/e' lateral: 10.5 LV IVS:        1.20 cm  LV e' medial:    4.46 cm/s LVOT diam:     2.50 cm  LV E/e' medial:  13.4 LV SV:         88 LV SV Index:   51       2D Longitudinal Strain LVOT Area:     4.91 cm 2D Strain GLS (A2C):   -19.7 % 2D Strain GLS (A3C):   -18.8 % 2D Strain GLS (A4C):   -21.0 % 2D Strain  GLS Avg:     -19.8 %  3D Volume EF: 3D EF:        55 % LV EDV:       76 ml LV ESV:       35 ml LV SV:        42 ml  RIGHT VENTRICLE RV Basal diam:  2.40 cm RV S prime:     11.50 cm/s TAPSE (M-mode): 1.7 cm  LEFT ATRIUM             Index       RIGHT ATRIUM           Index LA diam:        3.10 cm 1.80 cm/m  RA Pressure: 3.00 mmHg LA Vol (A2C):   22.8 ml 13.27 ml/m RA Area:     7.75 cm LA Vol (A4C):   27.3 ml 15.89 ml/m RA Volume:   14.00 ml  8.15 ml/m LA Biplane Vol: 26.4 ml 15.36 ml/m AORTIC VALVE LVOT Vmax:   90.20 cm/s LVOT Vmean:  59.900 cm/s LVOT VTI:    0.179 m  AORTA Ao Root diam: 3.30 cm Ao Asc diam:  3.60 cm  MITRAL VALVE               TRICUSPID VALVE Estimated RAP:  3.00 mmHg  MV E velocity: 59.70 cm/s  SHUNTS MV A velocity: 83.90 cm/s  Systemic VTI:  0.18 m MV E/A ratio:  0.71        Systemic Diam: 2.50 cm  Donato Schultz MD Electronically signed by Donato Schultz MD Signature Date/Time: 06/22/2020/3:50:49 PM    Final             Risk Assessment/Calculations:             Physical Exam:   VS:  BP 116/78 (BP Location: Left Arm, Patient Position: Sitting, Cuff Size: Normal)    Pulse 92   Ht 5\' 5"  (1.651 m)   Wt 127 lb 9.6 oz (57.9 kg)   SpO2 93%   BMI 21.23 kg/m    Wt Readings from Last 3 Encounters:  08/27/23 127 lb 9.6 oz (57.9 kg)  05/08/23 137 lb (62.1 kg)  04/04/23 139 lb (63 kg)    GEN: Well nourished, well developed in no acute distress. Sitting comfortably on the exam table  NECK: No JVD CARDIAC: RRR, no murmurs, rubs, gallops. Radial pulses 2+ bilaterally. Dorsalis pedis pulses 2+ bilaterally  RESPIRATORY:  Clear to auscultation without rales, wheezing or rhonchi. Normal work of breathing on room air  ABDOMEN: Soft, non-tender, non-distended EXTREMITIES:  No edema; No deformity   ASSESSMENT AND PLAN: .    Dizziness  - Patient reports having dizziness upon standing. Her husband checks her BP at home when she is dizzy, and it is sometimes low (90s/60s). Denies syncope. Has history of orthostatic intolerance  - Hemoglobin 11.5 on 9/9  - Patient admits that she does not drink very much water in the day. Instructed her to increase her fluid intake to at least 60 ounces daily  - Also instructed patient to make sure she is eating regularly throughout the day, not skipping meals, and increasing protein intake  - Stop daily diltiazem and instead take PRN for palpitations   Chest Discomfort  - Patient reports having occasional chest discomfort. Located below her sternum. Occurs once every few weeks and lasts for a full day at a time. Relieved by working in her garden, robitussin  - Denies chest  pain or shortness of breath on exertion  - Discussed that her chest discomfort is atypical for a cardiac source. I did offer nuclear stress test, but patient prefers to hold off for now. I agree  PVCs  -Most recent echocardiogram from 06/2020 showed EF 60 to 65%, no regional wall motion abnormalities, normal RV function - Patient denies palpitations. Has not had palpitations for quite some time  - With dizziness, stop diltiazem daily. Instead, take PRN for  palpitations   HLD  -Lipid panel from 03/2023 showed LDL 44 -Continue Lipitor 20 mg daily  Dispo: Follow up in 4-6 weeks   Signed, Jonita Albee, PA-C

## 2023-08-27 NOTE — Patient Instructions (Addendum)
Medication Instructions:  Decrease Cardizem 30 mg as needed *If you need a refill on your cardiac medications before your next appointment, please call your pharmacy*   Lab Work: No labs   Testing/Procedures: No testing   Follow-Up: At [facility], you and your health needs are our priority.  As part of our continuing mission to provide you with exceptional heart care, we have created designated Provider Care Teams.  These Care Teams include your primary Cardiologist (physician) and Advanced Practice Providers (APPs -  Physician Assistants and Nurse Practitioners) who all work together to provide you with the care you need, when you need it.  We recommend signing up for the patient portal called "MyChart".  Sign up information is provided on this After Visit Summary.  MyChart is used to connect with patients for Virtual Visits (Telemedicine).  Patients are able to view lab/test results, encounter notes, upcoming appointments, etc.  Non-urgent messages can be sent to your provider as well.   To learn more about what you can do with MyChart, go to [email or web address].    Your next appointment:   4-6 week(s)  Provider:   Any APP at [facility]  Other Instructions Drink at least 60 Oz of water a day and increase Protein (White Meat and Leafy greens) and Fiber.

## 2023-08-29 DIAGNOSIS — R531 Weakness: Secondary | ICD-10-CM | POA: Diagnosis not present

## 2023-08-29 DIAGNOSIS — R296 Repeated falls: Secondary | ICD-10-CM | POA: Diagnosis not present

## 2023-09-05 DIAGNOSIS — R531 Weakness: Secondary | ICD-10-CM | POA: Diagnosis not present

## 2023-09-05 DIAGNOSIS — R296 Repeated falls: Secondary | ICD-10-CM | POA: Diagnosis not present

## 2023-09-10 DIAGNOSIS — R531 Weakness: Secondary | ICD-10-CM | POA: Diagnosis not present

## 2023-09-10 DIAGNOSIS — R296 Repeated falls: Secondary | ICD-10-CM | POA: Diagnosis not present

## 2023-09-16 DIAGNOSIS — R531 Weakness: Secondary | ICD-10-CM | POA: Diagnosis not present

## 2023-09-16 DIAGNOSIS — R296 Repeated falls: Secondary | ICD-10-CM | POA: Diagnosis not present

## 2023-09-17 DIAGNOSIS — H903 Sensorineural hearing loss, bilateral: Secondary | ICD-10-CM | POA: Diagnosis not present

## 2023-09-17 DIAGNOSIS — H9312 Tinnitus, left ear: Secondary | ICD-10-CM | POA: Diagnosis not present

## 2023-09-17 DIAGNOSIS — I951 Orthostatic hypotension: Secondary | ICD-10-CM | POA: Diagnosis not present

## 2023-09-20 DIAGNOSIS — R531 Weakness: Secondary | ICD-10-CM | POA: Diagnosis not present

## 2023-09-20 DIAGNOSIS — R296 Repeated falls: Secondary | ICD-10-CM | POA: Diagnosis not present

## 2023-09-24 DIAGNOSIS — R296 Repeated falls: Secondary | ICD-10-CM | POA: Diagnosis not present

## 2023-09-24 DIAGNOSIS — R531 Weakness: Secondary | ICD-10-CM | POA: Diagnosis not present

## 2023-09-27 ENCOUNTER — Other Ambulatory Visit (HOSPITAL_BASED_OUTPATIENT_CLINIC_OR_DEPARTMENT_OTHER): Payer: Self-pay

## 2023-09-27 DIAGNOSIS — R531 Weakness: Secondary | ICD-10-CM | POA: Diagnosis not present

## 2023-09-27 DIAGNOSIS — R296 Repeated falls: Secondary | ICD-10-CM | POA: Diagnosis not present

## 2023-09-27 MED ORDER — INFLUENZA VAC A&B SURF ANT ADJ 0.5 ML IM SUSY
0.5000 mL | PREFILLED_SYRINGE | Freq: Once | INTRAMUSCULAR | 0 refills | Status: AC
  Filled 2023-09-27: qty 0.5, 1d supply, fill #0

## 2023-09-27 MED ORDER — COVID-19 MRNA VAC-TRIS(PFIZER) 30 MCG/0.3ML IM SUSY
0.3000 mL | PREFILLED_SYRINGE | Freq: Once | INTRAMUSCULAR | 0 refills | Status: AC
Start: 2023-09-27 — End: 2023-09-28
  Filled 2023-09-27: qty 0.3, 1d supply, fill #0

## 2023-09-30 DIAGNOSIS — C50911 Malignant neoplasm of unspecified site of right female breast: Secondary | ICD-10-CM | POA: Diagnosis not present

## 2023-09-30 DIAGNOSIS — G893 Neoplasm related pain (acute) (chronic): Secondary | ICD-10-CM | POA: Diagnosis not present

## 2023-09-30 DIAGNOSIS — M8580 Other specified disorders of bone density and structure, unspecified site: Secondary | ICD-10-CM | POA: Diagnosis not present

## 2023-09-30 DIAGNOSIS — C792 Secondary malignant neoplasm of skin: Secondary | ICD-10-CM | POA: Diagnosis not present

## 2023-09-30 DIAGNOSIS — N6489 Other specified disorders of breast: Secondary | ICD-10-CM | POA: Diagnosis not present

## 2023-10-01 ENCOUNTER — Ambulatory Visit: Payer: Medicare HMO | Admitting: Physician Assistant

## 2023-10-02 DIAGNOSIS — R296 Repeated falls: Secondary | ICD-10-CM | POA: Diagnosis not present

## 2023-10-02 DIAGNOSIS — R531 Weakness: Secondary | ICD-10-CM | POA: Diagnosis not present

## 2023-10-03 ENCOUNTER — Encounter: Payer: Medicare HMO | Admitting: Psychology

## 2023-10-11 ENCOUNTER — Other Ambulatory Visit: Payer: Self-pay | Admitting: Family Medicine

## 2023-10-12 ENCOUNTER — Encounter: Payer: Self-pay | Admitting: *Deleted

## 2023-10-14 DIAGNOSIS — R531 Weakness: Secondary | ICD-10-CM | POA: Diagnosis not present

## 2023-10-14 DIAGNOSIS — R296 Repeated falls: Secondary | ICD-10-CM | POA: Diagnosis not present

## 2023-10-18 ENCOUNTER — Encounter: Payer: Self-pay | Admitting: Internal Medicine

## 2023-10-20 NOTE — Telephone Encounter (Signed)
Can patient come in for orthostatic BP and P in the next 2 days   I am in clinic Mon/ Tuesday

## 2023-10-21 DIAGNOSIS — R531 Weakness: Secondary | ICD-10-CM | POA: Diagnosis not present

## 2023-10-21 DIAGNOSIS — R296 Repeated falls: Secondary | ICD-10-CM | POA: Diagnosis not present

## 2023-10-22 ENCOUNTER — Ambulatory Visit: Payer: Medicare HMO

## 2023-10-23 ENCOUNTER — Ambulatory Visit: Payer: Medicare HMO | Attending: Cardiology

## 2023-10-23 VITALS — BP 112/68 | HR 89 | Ht 65.0 in | Wt 128.2 lb

## 2023-10-23 DIAGNOSIS — I951 Orthostatic hypotension: Secondary | ICD-10-CM

## 2023-10-23 NOTE — Patient Instructions (Signed)
Medication Instructions:  Your physician recommends that you continue on your current medications as directed. Please refer to the Current Medication list given to you today.  *If you need a refill on your cardiac medications before your next appointment, please call your pharmacy*   Lab Work: NONE If you have labs (blood work) drawn today and your tests are completely normal, you will receive your results only by: MyChart Message (if you have MyChart) OR A paper copy in the mail If you have any lab test that is abnormal or we need to change your treatment, we will call you to review the results.   Testing/Procedures: NONE   Follow-Up:as scheduled At [facility], you and your health needs are our priority.  As part of our continuing mission to provide you with exceptional heart care, we have created designated Provider Care Teams.  These Care Teams include your primary Cardiologist (physician) and Advanced Practice Providers (APPs -  Physician Assistants and Nurse Practitioners) who all work together to provide you with the care you need, when you need it.    Provider:   Dietrich Pates, MD     OTHER INSTRUCTIONS: Please consider wearing compression socks.  Also increase your fluid intake to at least 64 ounces daily.

## 2023-10-23 NOTE — Progress Notes (Unsigned)
   Nurse Visit   Date of Encounter: 10/23/2023 ID: Manjot, Peiffer 25-Aug-1941, MRN [id number]  PCP:  Bradd Canary, MD   [facility] Providers Cardiologist:  Dietrich Pates, MD { Click to update primary MD,subspecialty MD or APP then REFRESH:1}     Visit Details   VS: lying 112/68-89 90%RA Sitting (dizzy) 99/67-90 96%RA  Standing (dizzy/wobbly) 82/55-94 97% RA Standing (dizzy) 104/68-98 94%  Wt Readings from Last 3 Encounters:  08/27/23 127 lb 9.6 oz (57.9 kg)  05/08/23 137 lb (62.1 kg)  04/04/23 139 lb (63 kg)     Reason for visit: Orthostatic VS Performed today: Vitals, Provider consulted:Dr. Elberta Fortis, and Education Changes (medications, testing, etc.) : Pt advised to use compression socks and increase PO fluid intake to at least 64 oz daily.  Pt reports only drinks about 40 oz daily.  Pt currently active in PT knows movement precautions r/t Orthostatic Hypotension.  Length of Visit: 20 minutes    Medications Adjustments/Labs and Tests Ordered: No orders of the defined types were placed in this encounter.  No orders of the defined types were placed in this encounter.    Signed, Macie Burows, RN  10/23/2023 2:15 PM

## 2023-10-24 DIAGNOSIS — R531 Weakness: Secondary | ICD-10-CM | POA: Diagnosis not present

## 2023-10-24 DIAGNOSIS — R296 Repeated falls: Secondary | ICD-10-CM | POA: Diagnosis not present

## 2023-10-28 ENCOUNTER — Telehealth: Payer: Self-pay

## 2023-10-28 DIAGNOSIS — H353132 Nonexudative age-related macular degeneration, bilateral, intermediate dry stage: Secondary | ICD-10-CM | POA: Diagnosis not present

## 2023-10-28 DIAGNOSIS — H353212 Exudative age-related macular degeneration, right eye, with inactive choroidal neovascularization: Secondary | ICD-10-CM | POA: Diagnosis not present

## 2023-10-28 DIAGNOSIS — H35711 Central serous chorioretinopathy, right eye: Secondary | ICD-10-CM | POA: Diagnosis not present

## 2023-10-28 NOTE — Telephone Encounter (Signed)
Pt is asking due to her recent nurse visit and orthostatic BP readings, if Dr Tenny Craw would consider putting her on some new medications.

## 2023-10-29 DIAGNOSIS — R531 Weakness: Secondary | ICD-10-CM | POA: Diagnosis not present

## 2023-10-29 DIAGNOSIS — R296 Repeated falls: Secondary | ICD-10-CM | POA: Diagnosis not present

## 2023-10-30 NOTE — Telephone Encounter (Signed)
BP was low. I agree with Anne [name]   Needs to drink more fluids, increase salt intake  Can she come in to clinic when I am there next week for orthostatics again Needs at least 2 L per day fluid WOuld do this before adding a medicine

## 2023-10-31 DIAGNOSIS — R531 Weakness: Secondary | ICD-10-CM | POA: Diagnosis not present

## 2023-10-31 DIAGNOSIS — R296 Repeated falls: Secondary | ICD-10-CM | POA: Diagnosis not present

## 2023-10-31 NOTE — Telephone Encounter (Signed)
Left a message for the pt to call back... can make her a nurse visit for Monday 11/04/23 at 2 pm.... need to use Dr Tenny Craw' orthostatic sheets that in her mailbox.

## 2023-11-01 NOTE — Telephone Encounter (Signed)
Pt advised and since no nurse visits available on Monday.. pt did not call back I called her again today... she will come 11/07/23.

## 2023-11-04 NOTE — Telephone Encounter (Signed)
Left the pt another message to remind her that her nurse visit BP check with our office is scheduled for this Thursday 12/26 at 2 pm.   Advised her in the message to call the office back to confirm she received this message.

## 2023-11-07 ENCOUNTER — Telehealth: Payer: Self-pay

## 2023-11-07 ENCOUNTER — Other Ambulatory Visit: Payer: Self-pay | Admitting: *Deleted

## 2023-11-07 ENCOUNTER — Ambulatory Visit: Payer: Medicare HMO | Attending: Internal Medicine

## 2023-11-07 DIAGNOSIS — Z79899 Other long term (current) drug therapy: Secondary | ICD-10-CM

## 2023-11-07 DIAGNOSIS — R42 Dizziness and giddiness: Secondary | ICD-10-CM

## 2023-11-07 DIAGNOSIS — R002 Palpitations: Secondary | ICD-10-CM

## 2023-11-07 DIAGNOSIS — I951 Orthostatic hypotension: Secondary | ICD-10-CM

## 2023-11-07 NOTE — Progress Notes (Signed)
   Nurse Visit   Date of Encounter: 11/07/2023 ID: Danaysia, Scavone 1941/04/05, MRN [id number]  PCP:  Bradd Canary, MD   [facility] Providers Cardiologist:  Dietrich Pates, MD      Visit Details   VS:  BP 128/74 (BP Location: Left Arm, Cuff Size: Normal)   Pulse 88   Wt 129 lb 6.4 oz (58.7 kg)   BMI 21.53 kg/m  , BMI Body mass index is 21.53 kg/m.  Wt Readings from Last 3 Encounters:  11/07/23 129 lb 6.4 oz (58.7 kg)  10/23/23 128 lb 3.2 oz (58.2 kg)  08/27/23 127 lb 9.6 oz (57.9 kg)     Reason for visit: Orthostatic Blood Pressure check Performed today:   , Vitals, Provider consulted:Dr. Tenny Craw, and Education Changes (medications, testing, etc.) : CBC, BMET, TSH, Cortisol, U/A Length of Visit: 10 minutes   Patient aware to increase her fluid intake and staying hydrated. Provider will call patient with further recommendations after labs come back.   Signed, Jovany Disano Merilynn Finland, LPN  32/44/0102 5:14 PM

## 2023-11-07 NOTE — Telephone Encounter (Signed)
Nurse visit today for orthostatic blood pressure  Lying-128/74 hr-88 -2:26  Sitting-110/66 hr-86 -2:31 Standing-98/58 hr-89 -2:36  Patient stating while standing she was a little dizzy. I asked was she dizzy before we started her blood pressures and she stated she is not sure she can't remember.

## 2023-11-08 LAB — CBC
Hematocrit: 33.3 % — ABNORMAL LOW (ref 34.0–46.6)
Hemoglobin: 11 g/dL — ABNORMAL LOW (ref 11.1–15.9)
MCH: 34.1 pg — ABNORMAL HIGH (ref 26.6–33.0)
MCHC: 33 g/dL (ref 31.5–35.7)
MCV: 103 fL — ABNORMAL HIGH (ref 79–97)
Platelets: 274 10*3/uL (ref 150–450)
RBC: 3.23 x10E6/uL — ABNORMAL LOW (ref 3.77–5.28)
RDW: 15.8 % — ABNORMAL HIGH (ref 11.7–15.4)
WBC: 7.2 10*3/uL (ref 3.4–10.8)

## 2023-11-08 LAB — BASIC METABOLIC PANEL
BUN/Creatinine Ratio: 25 (ref 12–28)
BUN: 16 mg/dL (ref 8–27)
CO2: 26 mmol/L (ref 20–29)
Calcium: 9.2 mg/dL (ref 8.7–10.3)
Chloride: 105 mmol/L (ref 96–106)
Creatinine, Ser: 0.65 mg/dL (ref 0.57–1.00)
Glucose: 87 mg/dL (ref 70–99)
Potassium: 4.5 mmol/L (ref 3.5–5.2)
Sodium: 144 mmol/L (ref 134–144)
eGFR: 88 mL/min/{1.73_m2} (ref >59)

## 2023-11-08 LAB — URINALYSIS
Bilirubin, UA: NEGATIVE
Glucose, UA: NEGATIVE
Ketones, UA: NEGATIVE
Nitrite, UA: NEGATIVE
Protein,UA: NEGATIVE
RBC, UA: NEGATIVE
Specific Gravity, UA: 1.01 (ref 1.005–1.030)
Urobilinogen, Ur: 0.2 mg/dL (ref 0.2–1.0)
pH, UA: 6.5 (ref 5.0–7.5)

## 2023-11-08 LAB — CORTISOL: Cortisol: 4.3 ug/dL — ABNORMAL LOW (ref 6.2–19.4)

## 2023-11-08 LAB — TSH: TSH: 2.16 u[IU]/mL (ref 0.450–4.500)

## 2023-11-11 ENCOUNTER — Other Ambulatory Visit: Payer: Self-pay | Admitting: Family Medicine

## 2023-11-12 ENCOUNTER — Telehealth: Payer: Self-pay

## 2023-11-12 DIAGNOSIS — E2749 Other adrenocortical insufficiency: Secondary | ICD-10-CM

## 2023-11-12 NOTE — Telephone Encounter (Signed)
 Left detailed message of results on pt's voicemail per [facility] permission. Referral placed to endocrinology for cortisyn stimulation test to be ordered and completed.

## 2023-11-12 NOTE — Telephone Encounter (Signed)
-----   Message from [place] sent at 11/10/2023 11:01 PM EST ----- Hgb is relatively stable at 11 (mlidly anemic) Thyroid  function is normal  Electrolytes and kidney function are normal  Urine is concentrated   Needs to drink more fluids  Cortisol is a little low     Would need to get cortisyn stimulation test   ? Where can this get done?

## 2023-11-14 DIAGNOSIS — N6489 Other specified disorders of breast: Secondary | ICD-10-CM | POA: Diagnosis not present

## 2023-11-14 DIAGNOSIS — G893 Neoplasm related pain (acute) (chronic): Secondary | ICD-10-CM | POA: Diagnosis not present

## 2023-11-14 DIAGNOSIS — C50911 Malignant neoplasm of unspecified site of right female breast: Secondary | ICD-10-CM | POA: Diagnosis not present

## 2023-11-18 NOTE — Assessment & Plan Note (Signed)
 Supplement and monitor

## 2023-11-18 NOTE — Assessment & Plan Note (Signed)
 Encouraged good sleep hygiene such as dark, quiet room. No blue/green glowing lights such as computer screens in bedroom. No alcohol or stimulants in evening. Cut down on caffeine as able. Regular exercise is helpful but not just prior to bed time.

## 2023-11-18 NOTE — Assessment & Plan Note (Signed)
 Stable on current dose of Fluoxetine and Lorazepam prn

## 2023-11-19 ENCOUNTER — Telehealth: Payer: Self-pay

## 2023-11-19 ENCOUNTER — Encounter: Payer: Self-pay | Admitting: Family Medicine

## 2023-11-19 ENCOUNTER — Ambulatory Visit (INDEPENDENT_AMBULATORY_CARE_PROVIDER_SITE_OTHER): Payer: Medicare HMO | Admitting: Family Medicine

## 2023-11-19 VITALS — BP 118/72 | HR 92 | Temp 98.1°F | Resp 18 | Ht 65.0 in | Wt 130.0 lb

## 2023-11-19 DIAGNOSIS — Z79899 Other long term (current) drug therapy: Secondary | ICD-10-CM | POA: Diagnosis not present

## 2023-11-19 DIAGNOSIS — J438 Other emphysema: Secondary | ICD-10-CM | POA: Diagnosis not present

## 2023-11-19 DIAGNOSIS — F32A Depression, unspecified: Secondary | ICD-10-CM

## 2023-11-19 DIAGNOSIS — R0600 Dyspnea, unspecified: Secondary | ICD-10-CM | POA: Diagnosis not present

## 2023-11-19 DIAGNOSIS — J45909 Unspecified asthma, uncomplicated: Secondary | ICD-10-CM | POA: Diagnosis not present

## 2023-11-19 DIAGNOSIS — D649 Anemia, unspecified: Secondary | ICD-10-CM

## 2023-11-19 DIAGNOSIS — S299XXA Unspecified injury of thorax, initial encounter: Secondary | ICD-10-CM

## 2023-11-19 DIAGNOSIS — F419 Anxiety disorder, unspecified: Secondary | ICD-10-CM | POA: Diagnosis not present

## 2023-11-19 DIAGNOSIS — G4700 Insomnia, unspecified: Secondary | ICD-10-CM | POA: Diagnosis not present

## 2023-11-19 MED ORDER — LORAZEPAM 1 MG PO TABS: ORAL_TABLET | ORAL | 5 refills | Status: DC

## 2023-11-19 MED ORDER — AMITRIPTYLINE HCL 25 MG PO TABS: ORAL_TABLET | ORAL | 5 refills | Status: DC

## 2023-11-19 NOTE — Progress Notes (Signed)
 Subjective:    Patient ID: Anne [name], female    DOB: 09/11/1941, 83 y.o.   MRN: [id number]  Chief Complaint  Patient presents with  . Follow-up    HPI Discussed the use of AI scribe software for clinical note transcription with the patient, who gave verbal consent to proceed.  History of Present Illness   The patient, with a known history of cancer, presents with lumps in the reconstructed breast and skin itching. The patient has been on Xeloda and reports that the CEA levels have normalized. The patient also mentions a recent increase in chest tightness, particularly when active. The patient has a history of COPD and has previously seen a pulmonologist. The patient also mentions a dry cough, which has raised concerns about lung cancer. The patient has been proactive in managing their health, including attending physical therapy sessions. However, the patient reports that leg pain has increased since the last appointment.        Past Medical History:  Diagnosis Date  . ALKALINE PHOSPHATASE, ELEVATED 03/15/2009  . Allergic state 06/10/2012  . Allergy    . Anemia   . Anxiety and depression 04/28/2011  . Arthritis   . Asthma   . Atypical chest pain 11/30/2011  . 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 Baker's cyst of knee 05/22/2011  . Cancer (HCC) 11/2006   XRT/chemo 01-02/ lobular invasive ca  . Carotid artery disease (HCC)    a. Carotid duplex 03/2014: stable 1-39% BICA, f/u due 03/2016.  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    . Clotting disorder (HCC)   . COPD (chronic obstructive pulmonary disease) (HCC)   . Dementia (HCC)   . Dermatitis 11/23/2012  . Dyspnea   . EE (eosinophilic esophagitis)   . Emphysema of lung (HCC)   . Esophageal ring   . ESOPHAGEAL STRICTURE 03/29/2009  . Fall 11/23/2012  . Family history of breast cancer   . Family history of colon cancer    . Family history of ovarian cancer   . Family history of pancreatic cancer   . Folliculitis of nose 12/31/2011  . GERD (gastroesophageal reflux disease) 09/29/2009   improved s/p cholecystectomy and esophagus dilatation  . History of chicken pox   . History of measles   . History of shingles    2 episodes  . Hx of echocardiogram    a. Echo 01/2013: mild LVH, EF 55-60%, normal wall motion, Gr 1 diast dysfn  . Hyperlipidemia   . Hypertension   . Knee pain, bilateral 07/23/2011  . Medicare annual wellness visit, subsequent 06/19/2015  . Mixed hyperlipidemia 10/17/2010   Qualifier: Diagnosis of  By: Edsel Mems    . Orthostasis   . Osteopenia 03/14/2011  . Osteoporosis   . Personal history of chemotherapy 2001  . Personal history of radiation therapy 2001   rt breast  . 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 Pneumonia   . PVC's (premature ventricular contractions)    a. Event monitor 01/2013: NSR, extensive PVCs.  . Radial neck fracture 10/2011   minimally displaced  . Substance abuse (HCC)    In remission 3 years.   . Urinary incontinence 03/19/2012  . Vaginitis 05/22/2011    Past Surgical History:  Procedure Laterality Date  . APPENDECTOMY    . AUGMENTATION MAMMAPLASTY Right 05/27/2007  . BREAST BIOPSY Right 01/02/2007   wire loc  .  BREAST BIOPSY  12/26/2006  . BREAST LUMPECTOMY Right 2001  . BREAST RECONSTRUCTION  2008, 2009, 2010  . BREAST REDUCTION WITH MASTOPEXY Left 05/30/2017   Procedure: LEFT BREAST REDUCTION FOR SYMTERY WITH MASTOPEXY;  Surgeon: Lowery Estefana RAMAN, DO;  Location: [facility];  Service: Plastics;  Laterality: Left;  . CHOLECYSTECTOMY  2010  . COLONOSCOPY  09/05/10   cecal avm's  . DENTAL SURGERY  05/2016   4 dental implants by Dr. Mohorn.  SABRA ERCP  2010    CBD stone extraction   . ESOPHAGOGASTRODUODENOSCOPY  01/08/2012   Procedure: ESOPHAGOGASTRODUODENOSCOPY (EGD);  Surgeon: Lupita FORBES Commander, MD;  Location: [facility];   Service: Endoscopy;  Laterality: N/A;  . ESOPHAGOGASTRODUODENOSCOPY (EGD) WITH ESOPHAGEAL DILATION  2010, 2012  . LAPAROSCOPIC APPENDECTOMY N/A 08/04/2016   Procedure: APPENDECTOMY LAPAROSCOPIC;  Surgeon: Elspeth Schultze, MD;  Location: [facility];  Service: General;  Laterality: N/A;  . LIPOSUCTION WITH LIPOFILLING Left 11/21/2017   Procedure: LIPOSUCTION FROM ABDOMEN WITH LIPOFILLING TO LEFT BREAST;  Surgeon: Lowery Estefana RAMAN, DO;  Location: [facility];  Service: Plastics;  Laterality: Left;  SABRA MASTECTOMY MODIFIED RADICAL Right 05/27/2007   , Mastectomy modified radical (08), breast reconstruction, CA lesions excised lateral abd wall 2010  . MASTOPEXY Left 11/21/2017   Procedure: LEFT BREAST REVISION MASTOPEXY FOR SYMMETRY;  Surgeon: Lowery Estefana RAMAN, DO;  Location: [facility];  Service: Plastics;  Laterality: Left;  . REDUCTION MAMMAPLASTY Left   . SAVORY DILATION  01/08/2012   Procedure: SAVORY DILATION;  Surgeon: Lupita FORBES Commander, MD;  Location: [facility];  Service: Endoscopy;  Laterality: N/A;  need xray    Family History  Problem Relation Age of Onset  . Other Mother        tic [place]  . Heart disease Father   . Lung cancer Father        smoker  . Cirrhosis Sister        Primary Biliary  . Anxiety disorder Sister   . Osteoporosis Sister   . Arthritis Sister        Rheumatoid  . Osteoporosis Sister   . Skin cancer Sister        multiple skin cancers, over 90 excisions.  . Stroke Maternal Grandmother   . Alcohol abuse Maternal Grandfather   . Heart disease Paternal Grandfather   . Esophageal cancer Paternal Grandfather   . Rectal cancer Paternal Grandfather   . Breast cancer Maternal Aunt        dx in her 28s  . Breast cancer Maternal Aunt        dx in her 55s  . Breast cancer Maternal Aunt        possible breast cancer dx and died in her 8s  . Ovarian cancer Maternal Aunt 29  . Pancreatic cancer Maternal Aunt        dx in her 12s  .  Pancreatic cancer Maternal Uncle        dx in his 7s; smoker  . Stomach cancer Maternal Uncle   . Cancer Maternal Uncle   . Breast cancer Paternal Aunt        dx in her 42s  . Lung cancer Paternal Aunt   . Breast cancer Cousin        paternal first cousin  . Breast cancer Cousin        maternal first cousin  . Anesthesia problems Neg Hx   . Hypotension Neg Hx   .  Malignant hyperthermia Neg Hx   . Pseudochol deficiency Neg Hx     Social History   Socioeconomic History  . Marital status: Married    Spouse name: Sam  . Number of children: 3  . Years of education: Not on file  . Highest education level: Not on file  Occupational History  . Occupation: arboriculturist  Tobacco Use  . Smoking status: Former    Current packs/day: 0.00    Average packs/day: 1 pack/day for 50.0 years (50.0 ttl pk-yrs)    Types: Cigarettes    Start date: 05/21/1957    Quit date: 05/22/2007    Years since quitting: 16.5  . Smokeless tobacco: Never  Vaping Use  . Vaping status: Never Used  Substance and Sexual Activity  . Alcohol use: Not Currently  . Drug use: No  . Sexual activity: Yes    Partners: Male    Comment: lives with husband,   Other Topics Concern  . Not on file  Social History Narrative  . Not on file   Social Drivers of Health   Financial Resource Strain: Low Risk  (02/27/2022)   Overall Financial Resource Strain (CARDIA)   . Difficulty of Paying Living Expenses: Not hard at all  Food Insecurity: No Food Insecurity (03/22/2023)   Hunger Vital Sign   . Worried About Programme Researcher, Broadcasting/film/video in the Last Year: Never true   . Ran Out of Food in the Last Year: Never true  Transportation Needs: No Transportation Needs (03/22/2023)   PRAPARE - Transportation   . Lack of Transportation (Medical): No   . Lack of Transportation (Non-Medical): No  Physical Activity: Sufficiently Active (02/27/2022)   Exercise Vital Sign   . Days of Exercise per Week: 7 days   . Minutes of Exercise per Session:  60 min  Stress: No Stress Concern Present (02/27/2022)   [employer] of Occupational Health - Occupational Stress Questionnaire   . Feeling of Stress : Not at all  Social Connections: Moderately Integrated (02/27/2022)   Social Connection and Isolation Panel [NHANES]   . Frequency of Communication with Friends and Family: More than three times a week   . Frequency of Social Gatherings with Friends and Family: More than three times a week   . Attends Religious Services: More than 4 times per year   . Active Member of Clubs or Organizations: No   . Attends Banker Meetings: Never   . Marital Status: Married  Catering Manager Violence: Not At Risk (03/22/2023)   Humiliation, Afraid, Rape, and Kick questionnaire   . Fear of Current or Ex-Partner: No   . Emotionally Abused: No   . Physically Abused: No   . Sexually Abused: No    Outpatient Medications Prior to Visit  Medication Sig Dispense Refill  . acetaminophen  (TYLENOL ) 500 MG tablet Take 500-1,000 mg by mouth every 6 (six) hours as needed for moderate pain.    . aspirin  81 MG EC tablet Take 81 mg by mouth daily.    . atorvastatin  (LIPITOR) 20 MG tablet Take 1 tablet by mouth in the morning and at bedtime. 180 tablet 3  . Biotin 5 MG CAPS Take 5 mg by mouth daily.    . Brimonidine Tartrate (LUMIFY) 0.025 % SOLN Place 1 drop into both eyes daily.    . capecitabine (XELODA) 150 MG tablet Take 600-900 mg by mouth See admin instructions. Take 900 mg by mouth in the morning and 600 mg in the evening daily  every other week.    . Cholecalciferol (VITAMIN D3) 1000 UNITS CAPS Take 2,000 Units by mouth daily.    . Cyanocobalamin  (VITAMIN B 12 PO) Take 2 tablets by mouth daily.    . diclofenac  Sodium (VOLTAREN  ARTHRITIS PAIN) 1 % GEL Apply 2 g topically daily as needed (hip pain).    . diltiazem  (CARDIZEM ) 30 MG tablet As needed 90 tablet 1  . FLUoxetine  (PROZAC ) 40 MG capsule Take 1 capsule (40 mg total) by mouth daily. 90  capsule 0  . Folic Acid  (FOLATE PO) Take 1 tablet by mouth daily.    SABRA ipratropium (ATROVENT ) 0.06 % nasal spray Place 2 sprays into both nostrils 4 (four) times daily. 15 mL 0  . nystatin -triamcinolone  ointment (MYCOLOG) Apply topically 3 (three) times daily.    . pantoprazole  (PROTONIX ) 40 MG tablet TAKE 1 TABLET(40 MG) BY MOUTH TWICE DAILY BEFORE A MEAL 180 tablet 0  . Pyridoxine HCl (B-6 PO) Take 1 tablet by mouth daily.    . Thiamine  HCl (B-1 PO) Take 1 tablet by mouth daily.    . triamcinolone  cream (KENALOG ) 0.1 % Apply 1 Application topically 2 (two) times daily. 30 g 0  . amitriptyline  (ELAVIL ) 25 MG tablet TAKE 4 TABLETS BY MOUTH DAILY AT BEDTIME 120 tablet 0  . COVID-19 mRNA vaccine 2023-2024 (COMIRNATY ) syringe Inject into the muscle. 0.3 mL 0  . influenza vaccine adjuvanted (FLUAD  QUADRIVALENT) 0.5 ML injection Inject into the muscle. 0.5 mL 0  . LORazepam  (ATIVAN ) 1 MG tablet TAKE 1 TABLET(1 MG) BY MOUTH EVERY 8 HOURS AS NEEDED FOR ANXIETY 90 tablet 1   No facility-administered medications prior to visit.    Allergies  Allergen Reactions  . Sorbitol Other (See Comments)    GI Issues  . Tomato Diarrhea  . Zofran  Other (See Comments)    headache  . Advil  [Ibuprofen ] Other (See Comments)    Irritates throat.  . Cephalexin  Other (See Comments)    dizziness  . Diphenhydramine  Hcl Other (See Comments)    nervous and upset does okay w/ cream  . Morphine  And Codeine Other (See Comments)    Causes depression  . Tramadol  Other (See Comments)    Dizziness  . Antihistamines, Chlorpheniramine-Type Anxiety  . Oxycodone  Anxiety    Confusion with inability to think clearly    Review of Systems  Constitutional:  Positive for malaise/fatigue. Negative for fever.  HENT:  Negative for congestion.   Eyes:  Negative for blurred vision.  Respiratory:  Positive for cough and shortness of breath. Negative for wheezing.   Cardiovascular:  Positive for chest pain. Negative for  palpitations and leg swelling.  Gastrointestinal:  Negative for abdominal pain, blood in stool and nausea.  Genitourinary:  Negative for dysuria and frequency.  Musculoskeletal:  Negative for falls.  Skin:  Negative for rash.  Neurological:  Negative for dizziness, loss of consciousness and headaches.  Endo/Heme/Allergies:  Negative for environmental allergies.  Psychiatric/Behavioral:  Negative for depression. The patient is nervous/anxious and has insomnia.       Objective:    Physical Exam Constitutional:      General: She is not in acute distress.    Appearance: Normal appearance. She is well-developed. She is not toxic-appearing.  HENT:     Head: Normocephalic and atraumatic.     Right Ear: External ear normal.     Left Ear: External ear normal.     Nose: Nose normal.  Eyes:     General:  Right eye: No discharge.        Left eye: No discharge.     Conjunctiva/sclera: Conjunctivae normal.  Neck:     Thyroid : No thyromegaly.  Cardiovascular:     Rate and Rhythm: Normal rate and regular rhythm.     Heart sounds: Normal heart sounds. No murmur heard. Pulmonary:     Effort: Pulmonary effort is normal. No respiratory distress.     Breath sounds: Normal breath sounds.  Abdominal:     General: Bowel sounds are normal.     Palpations: Abdomen is soft.     Tenderness: There is no abdominal tenderness. There is no guarding.  Musculoskeletal:        General: Normal range of motion.     Cervical back: Neck supple.  Lymphadenopathy:     Cervical: No cervical adenopathy.  Skin:    General: Skin is warm and dry.  Neurological:     Mental Status: She is alert and oriented to person, place, and time.  Psychiatric:        Mood and Affect: Mood normal.        Behavior: Behavior normal.        Thought Content: Thought content normal.        Judgment: Judgment normal.   BP 118/72 (BP Location: Left Arm, Patient Position: Sitting, Cuff Size: Normal)   Pulse 92   Temp 98.1  F (36.7 C) (Oral)   Resp 18   Ht 5' 5 (1.651 m)   Wt 130 lb (59 kg)   SpO2 99%   BMI 21.63 kg/m  Wt Readings from Last 3 Encounters:  11/19/23 130 lb (59 kg)  11/07/23 129 lb 6.4 oz (58.7 kg)  10/23/23 128 lb 3.2 oz (58.2 kg)    Diabetic Foot Exam - Simple   No data filed    Lab Results  Component Value Date   WBC 7.2 11/07/2023   HGB 11.0 (L) 11/07/2023   HCT 33.3 (L) 11/07/2023   PLT 274 11/07/2023   GLUCOSE 87 11/07/2023   CHOL 143 04/04/2023   TRIG 166.0 (H) 04/04/2023   HDL 65.70 04/04/2023   LDLDIRECT 117.4 01/13/2013   LDLCALC 44 04/04/2023   ALT 17 04/24/2023   AST 18 04/24/2023   NA 144 11/07/2023   K 4.5 11/07/2023   CL 105 11/07/2023   CREATININE 0.65 11/07/2023   BUN 16 11/07/2023   CO2 26 11/07/2023   TSH 2.160 11/07/2023   INR 1.02 08/12/2017   HGBA1C 6.0 04/04/2023    Lab Results  Component Value Date   TSH 2.160 11/07/2023   Lab Results  Component Value Date   WBC 7.2 11/07/2023   HGB 11.0 (L) 11/07/2023   HCT 33.3 (L) 11/07/2023   MCV 103 (H) 11/07/2023   PLT 274 11/07/2023   Lab Results  Component Value Date   NA 144 11/07/2023   K 4.5 11/07/2023   CHLORIDE 104 09/04/2017   CO2 26 11/07/2023   GLUCOSE 87 11/07/2023   BUN 16 11/07/2023   CREATININE 0.65 11/07/2023   BILITOT 0.4 04/24/2023   ALKPHOS 67 04/24/2023   AST 18 04/24/2023   ALT 17 04/24/2023   PROT 6.6 04/24/2023   ALBUMIN 3.8 04/24/2023   CALCIUM  9.2 11/07/2023   ANIONGAP 9 10/22/2021   EGFR 88 11/07/2023   GFR 78.85 04/24/2023   Lab Results  Component Value Date   CHOL 143 04/04/2023   Lab Results  Component Value Date   HDL 65.70  04/04/2023   Lab Results  Component Value Date   LDLCALC 44 04/04/2023   Lab Results  Component Value Date   TRIG 166.0 (H) 04/04/2023   Lab Results  Component Value Date   CHOLHDL 2 04/04/2023   Lab Results  Component Value Date   HGBA1C 6.0 04/04/2023       Assessment & Plan:  Anemia, unspecified  type Assessment & Plan: Supplement and monitor    Anxiety and depression Assessment & Plan: Stable on current dose of Fluoxetine  and Lorazepam  prn  Orders: -     Drug Monitoring Panel [phone] , Urine  Insomnia, unspecified type Assessment & Plan: Encouraged good sleep hygiene such as dark, quiet room. No blue/green glowing lights such as computer screens in bedroom. No alcohol or stimulants in evening. Cut down on caffeine as able. Regular exercise is helpful but not just prior to bed time.     Chest trauma, initial encounter -     LORazepam ; TAKE 1 TABLET(1 MG) BY MOUTH EVERY 8 HOURS AS NEEDED FOR ANXIETY  Dispense: 90 tablet; Refill: 5  High risk medication use -     Drug Monitoring Panel [phone] , Urine  Uncomplicated asthma, unspecified asthma severity, unspecified whether persistent -     Ambulatory referral to Pulmonology  Other emphysema [facility]) Assessment & Plan: Notes some increase in Shortness of Breath with exertion and is willing to return to pulmonology for further consideration but prefers not to return to the same office. Referred to [facility] in [place]  Orders: -     Ambulatory referral to Pulmonology  Dyspnea, unspecified type -     Ambulatory referral to Pulmonology  Other orders -     Amitriptyline  HCl; TAKE 4 TABLETS BY MOUTH DAILY AT BEDTIME  Dispense: 120 tablet; Refill: 5    Assessment and Plan    Breast Cancer Patient reports feeling lumps in reconstructed breast and itching, which she associates with cancer symptoms. Currently on Xeloda with stable CEA levels. Scheduled for a CT scan in February. -Continue Xeloda as prescribed. -Undergo CT scan in February.  Chest Discomfort Patient reports occasional chest tightness/shortness of breath., particularly when active. Unclear if this is cardiac or pulmonary in origin. she agrees to referral to new pulmonologist for evaluation.  -Refer to pulmonology for further evaluation.  Medication Management Patient is  currently on amitriptyline  and lorazepam . No changes to current regimen. -Refill amitriptyline  and lorazepam  prescriptions. -Advise patient to swallow pills with warm water to aid in swallowing.  General Health Maintenance Discussed the importance of hydration, protein intake, and maintaining social connections for overall health. -Encourage patient to hydrate regularly, consume protein every four hours, and maintain social connections. -Plan to review labs and update as necessary at next visit in six months.         Harlene Horton, MD

## 2023-11-19 NOTE — Patient Instructions (Addendum)
 [employer] to [employer]  Protein every 4 hours and hydrate 10 ounces of fluids every 1-2 hours

## 2023-11-19 NOTE — Telephone Encounter (Signed)
 PA initiated via Covermymeds; KEY: [id number]. Awaiting determination.

## 2023-11-19 NOTE — Assessment & Plan Note (Signed)
 Notes some increase in Shortness of Breath with exertion and is willing to return to pulmonology for further consideration but prefers not to return to the same office. Referred to [facility] in [place]

## 2023-11-19 NOTE — Telephone Encounter (Signed)
 PA approved.   This approval authorizes your coverage from 11/13/2023 - 11/11/2024

## 2023-11-22 LAB — DRUG MONITORING PANEL 376104, URINE
Alphahydroxyalprazolam: NEGATIVE ng/mL (ref <25)
Alphahydroxymidazolam: NEGATIVE ng/mL (ref <50)
Alphahydroxytriazolam: NEGATIVE ng/mL (ref <50)
Aminoclonazepam: NEGATIVE ng/mL (ref <25)
Amphetamines: NEGATIVE ng/mL (ref <500)
Barbiturates: NEGATIVE ng/mL (ref <300)
Benzodiazepines: POSITIVE ng/mL — AB (ref <100)
Cocaine Metabolite: NEGATIVE ng/mL (ref <150)
Desmethyltramadol: NEGATIVE ng/mL (ref <100)
Hydroxyethylflurazepam: NEGATIVE ng/mL (ref <50)
Lorazepam: 917 ng/mL — ABNORMAL HIGH (ref <50)
Nordiazepam: NEGATIVE ng/mL (ref <50)
Opiates: NEGATIVE ng/mL (ref <100)
Oxazepam: NEGATIVE ng/mL (ref <50)
Oxycodone: NEGATIVE ng/mL (ref <100)
Temazepam: NEGATIVE ng/mL (ref <50)
Tramadol: NEGATIVE ng/mL (ref <100)

## 2023-11-22 LAB — DM TEMPLATE

## 2023-11-28 DIAGNOSIS — R296 Repeated falls: Secondary | ICD-10-CM | POA: Diagnosis not present

## 2023-11-28 DIAGNOSIS — R531 Weakness: Secondary | ICD-10-CM | POA: Diagnosis not present

## 2023-12-02 DIAGNOSIS — R296 Repeated falls: Secondary | ICD-10-CM | POA: Diagnosis not present

## 2023-12-02 DIAGNOSIS — R531 Weakness: Secondary | ICD-10-CM | POA: Diagnosis not present

## 2023-12-05 ENCOUNTER — Other Ambulatory Visit: Payer: Self-pay | Admitting: Family Medicine

## 2023-12-05 DIAGNOSIS — R531 Weakness: Secondary | ICD-10-CM | POA: Diagnosis not present

## 2023-12-05 DIAGNOSIS — F322 Major depressive disorder, single episode, severe without psychotic features: Secondary | ICD-10-CM

## 2023-12-05 DIAGNOSIS — R296 Repeated falls: Secondary | ICD-10-CM | POA: Diagnosis not present

## 2023-12-06 NOTE — Progress Notes (Unsigned)
Cardiology Office Note   Date:  12/09/2023   ID:  Anne, [name] July 15, 1941, MRN [id number]  PCP:  Bradd Canary, MD  Cardiologist:   Dietrich Pates, MD   Pt presents for f/u of dizziness and palpitations.    History of Present Illness: Anne [name] is a 83 y.o. female with a history of orthostatic intolerance, carotid stenosis, HL, metastatic breast CA, COPD, anemia, GERD, eosinophilic esophagitis,  Echo 3/14: Mild LVH, EF 55-60%, grade 1 diastolic dysfunction. Event monitor 01/2013: NSR, extensive PVC, CV dz, palpitations.     Myoview 4/14: Low risk, no scar or ischemia, not gated.  She has been treated with low dose metoprolol for PVCs and palps.    I saw the pt in June 2024   She was seen by Adin Hector in the interval   The pt came in for orthostatic check this past fall   She was orthostatic     Labs:  Cortisol a little low   Referred to endocrine  Since seen she says she is doing better   Still dizzy at times, no syncope  Denies CP Being seen in PT   DOing exercises for weight bearing, balance    Current Meds  Medication Sig   acetaminophen (TYLENOL) 500 MG tablet Take 500-1,000 mg by mouth every 6 (six) hours as needed for moderate pain.   amitriptyline (ELAVIL) 25 MG tablet TAKE 4 TABLETS BY MOUTH DAILY AT BEDTIME   aspirin 81 MG EC tablet Take 81 mg by mouth daily.   atorvastatin (LIPITOR) 20 MG tablet Take 1 tablet by mouth in the morning and at bedtime.   Biotin 5 MG CAPS Take 5 mg by mouth daily.   Brimonidine Tartrate (LUMIFY) 0.025 % SOLN Place 1 drop into both eyes daily.   capecitabine (XELODA) 150 MG tablet Take 600-900 mg by mouth See admin instructions. Take 900 mg by mouth in the morning and 600 mg in the evening daily every other week.   Cholecalciferol (VITAMIN D3) 1000 UNITS CAPS Take 2,000 Units by mouth daily.   Cyanocobalamin (VITAMIN B 12 PO) Take 2 tablets by mouth daily.   diclofenac Sodium (VOLTAREN ARTHRITIS PAIN) 1 % GEL Apply 2 g  topically daily as needed (hip pain).   diltiazem (CARDIZEM) 30 MG tablet As needed   FLUoxetine (PROZAC) 40 MG capsule TAKE 1 CAPSULE(40 MG) BY MOUTH DAILY   Folic Acid (FOLATE PO) Take 1 tablet by mouth daily.   ipratropium (ATROVENT) 0.06 % nasal spray Place 2 sprays into both nostrils 4 (four) times daily.   LORazepam (ATIVAN) 1 MG tablet TAKE 1 TABLET(1 MG) BY MOUTH EVERY 8 HOURS AS NEEDED FOR ANXIETY   nystatin-triamcinolone ointment (MYCOLOG) Apply topically 3 (three) times daily.   pantoprazole (PROTONIX) 40 MG tablet TAKE 1 TABLET(40 MG) BY MOUTH TWICE DAILY BEFORE A MEAL   Pyridoxine HCl (B-6 PO) Take 1 tablet by mouth daily.   Thiamine HCl (B-1 PO) Take 1 tablet by mouth daily.   triamcinolone cream (KENALOG) 0.1 % Apply 1 Application topically 2 (two) times daily.     Allergies:   Sorbitol; Tomato; Zofran; Advil [ibuprofen]; Cephalexin; Diphenhydramine hcl; Morphine and codeine; Tramadol; Antihistamines, chlorpheniramine-type; and Oxycodone   Past Medical History:  Diagnosis Date   ALKALINE PHOSPHATASE, ELEVATED 03/15/2009   Allergic state 06/10/2012   Allergy    Anemia    Anxiety and depression 04/28/2011   Arthritis    Asthma    Atypical chest pain  11/30/2011   AVM (arteriovenous malformation) of colon 2011   cecum   Baker's cyst of knee 05/22/2011   Cancer (HCC) 11/2006   XRT/chemo 01-02/ lobular invasive ca   Carotid artery disease (HCC)    a. Carotid duplex 03/2014: stable 1-39% BICA, f/u due 03/2016.   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     Clotting disorder (HCC)    COPD (chronic obstructive pulmonary disease) (HCC)    Dementia (HCC)    Dermatitis 11/23/2012   Dyspnea    EE (eosinophilic esophagitis)    Emphysema of lung (HCC)    Esophageal ring    ESOPHAGEAL STRICTURE 03/29/2009   Fall 11/23/2012   Family history of breast cancer    Family history of colon cancer    Family  history of ovarian cancer    Family history of pancreatic cancer    Folliculitis of nose 12/31/2011   GERD (gastroesophageal reflux disease) 09/29/2009   improved s/p cholecystectomy and esophagus dilatation   History of chicken pox    History of measles    History of shingles    2 episodes   Hx of echocardiogram    a. Echo 01/2013: mild LVH, EF 55-60%, normal wall motion, Gr 1 diast dysfn   Hyperlipidemia    Hypertension    Knee pain, bilateral 07/23/2011   Medicare annual wellness visit, subsequent 06/19/2015   Mixed hyperlipidemia 10/17/2010   Qualifier: Diagnosis of  By: Omar Person     Orthostasis    Osteopenia 03/14/2011   Osteoporosis    Personal history of chemotherapy 2001   Personal history of radiation therapy 2001   rt breast   PERSONAL HX BREAST CANCER 09/29/2009   Pneumonia    PVC's (premature ventricular contractions)    a. Event monitor 01/2013: NSR, extensive PVCs.   Radial neck fracture 10/2011   minimally displaced   Substance abuse (HCC)    In remission 3 years.    Urinary incontinence 03/19/2012   Vaginitis 05/22/2011    Past Surgical History:  Procedure Laterality Date   APPENDECTOMY     AUGMENTATION MAMMAPLASTY Right 05/27/2007   BREAST BIOPSY Right 01/02/2007   wire loc   BREAST BIOPSY  12/26/2006   BREAST LUMPECTOMY Right 2001   BREAST RECONSTRUCTION  2008, 2009, 2010   BREAST REDUCTION WITH MASTOPEXY Left 05/30/2017   Procedure: LEFT BREAST REDUCTION FOR SYMTERY WITH MASTOPEXY;  Surgeon: Peggye Form, DO;  Location: [facility];  Service: Plastics;  Laterality: Left;   CHOLECYSTECTOMY  2010   COLONOSCOPY  09/05/10   cecal [facility]   DENTAL SURGERY  05/2016   4 dental implants by Dr. Retta Mac.   ERCP  2010    CBD stone extraction    ESOPHAGOGASTRODUODENOSCOPY  01/08/2012   Procedure: ESOPHAGOGASTRODUODENOSCOPY (EGD);  Surgeon: [name], MD;  Location: [facility];  Service: Endoscopy;  Laterality: N/A;    ESOPHAGOGASTRODUODENOSCOPY (EGD) WITH ESOPHAGEAL DILATION  2010, 2012   LAPAROSCOPIC APPENDECTOMY N/A 08/04/2016   Procedure: APPENDECTOMY LAPAROSCOPIC;  Surgeon: Karie Soda, MD;  Location: [facility];  Service: General;  Laterality: N/A;   LIPOSUCTION WITH LIPOFILLING Left 11/21/2017   Procedure: LIPOSUCTION FROM ABDOMEN WITH LIPOFILLING TO LEFT BREAST;  Surgeon: Peggye Form, DO;  Location: [facility];  Service: Plastics;  Laterality: Left;   MASTECTOMY MODIFIED RADICAL Right 05/27/2007   , Mastectomy modified radical (08), breast reconstruction, CA lesions excised lateral  abd wall 2010   MASTOPEXY Left 11/21/2017   Procedure: LEFT BREAST REVISION MASTOPEXY FOR SYMMETRY;  Surgeon: Peggye Form, DO;  Location: [facility];  Service: Plastics;  Laterality: Left;   REDUCTION MAMMAPLASTY Left    SAVORY DILATION  01/08/2012   Procedure: SAVORY DILATION;  Surgeon: [name], MD;  Location: [facility];  Service: Endoscopy;  Laterality: N/A;  need xray     Social History:  The patient  reports that she quit smoking about 16 years ago. Her smoking use included cigarettes. She started smoking about 66 years ago. She has a 50 pack-year smoking history. She has never used smokeless tobacco. She reports that she does not currently use alcohol. She reports that she does not use drugs.   Family History:  The patient's family history includes Alcohol abuse in her maternal grandfather; Anxiety disorder in her sister; Arthritis in her sister; Breast cancer in her cousin, cousin, maternal aunt, maternal aunt, maternal aunt, and paternal aunt; Cancer in her maternal uncle; Cirrhosis in her sister; Esophageal cancer in her paternal grandfather; Heart disease in her father and paternal grandfather; Lung cancer in her father and paternal aunt; Osteoporosis in her sister and sister; Other in her mother; Ovarian cancer (age of onset: 58) in her maternal aunt; Pancreatic cancer  in her maternal aunt and maternal uncle; Rectal cancer in her paternal grandfather; Skin cancer in her sister; Stomach cancer in her maternal uncle; Stroke in her maternal grandmother.    ROS:  Please see the history of present illness. All other systems are reviewed and  Negative to the above problem except as noted.    PHYSICAL EXAM: VS:  BP 112/70   Pulse 92   Ht 5\' 5"  (1.651 m)   Wt 129 lb 12.8 oz (58.9 kg)   SpO2 98%   BMI 21.60 kg/m    BP layin 118/80  P 86  Sitting  112/74  P 84  Standing 2 min  120/72  P 86 Standing  4 min   116/77  P 87  GEN:  Thin 83 yo  in no acute distress  HEENT: normal  Neck: no JVD, no carotid bruits Cardiac: RRR; no murmurs  No LE  edema  Respiratory:  clear to auscultation  GI: soft, nontender No hepatomegaly    EKG:  EKG is not ordered today.   Echo  2021  1. Left ventricular ejection fraction, by estimation, is 60 to 65%. The  left ventricle has normal function. The left ventricle has no regional  wall motion abnormalities. Left ventricular diastolic parameters are  consistent with Grade I diastolic  dysfunction (impaired relaxation). The average left ventricular global  longitudinal strain is -19.8 %. The global longitudinal strain is normal.   2. Right ventricular systolic function is normal. The right ventricular  size is normal.   3. The mitral valve is normal in structure. Trivial mitral valve  regurgitation. No evidence of mitral stenosis.   4. The aortic valve is normal in structure. Aortic valve regurgitation is  not visualized. No aortic stenosis is present.   5. The inferior vena cava is normal in size with greater than 50%  respiratory variability, suggesting right atrial pressure of 3 mmHg.   Comparison(s): 02/28/18 EF 55-60%.   Carotid USN   2017  Heterogeneous plaque, bilaterally. Stable 1-39% bilateral ICA stenosis. Normal subclavian arteries, bilaterally. Patent vertebral arteries with antegrade flow. f/u PRN. Lipid  Panel    Component Value Date/Time  CHOL 143 04/04/2023 1613   TRIG 166.0 (H) 04/04/2023 1613   HDL 65.70 04/04/2023 1613   CHOLHDL 2 04/04/2023 1613   VLDL 33.2 04/04/2023 1613   LDLCALC 44 04/04/2023 1613   LDLDIRECT 117.4 01/13/2013 1210      Wt Readings from Last 3 Encounters:  12/09/23 129 lb 12.8 oz (58.9 kg)  11/19/23 130 lb (59 kg)  11/07/23 129 lb 6.4 oz (58.7 kg)      ASSESSMENT AND PLAN:  1  Dizziness.  Pt was orthostatic last fall  Feeling better, numbers better today   Follow  Check cortisol  2 Hx palpitations   Pt without complaints  3  CV dz  Continue statin    4  HL  LDL 44  HDL 66 in may 2024   Continue statin   5  Hx Breast CA  Follows in oncology at [facility]  Current medicines are reviewed at length with the patient today.  The patient does not have concerns regarding medicines.  Signed, Dietrich Pates, MD  12/09/2023 3:03 PM    [facility] [name] [place], [place], [place]  [address] Phone: [phone]; Fax: [phone]

## 2023-12-09 ENCOUNTER — Encounter: Payer: Self-pay | Admitting: Internal Medicine

## 2023-12-09 ENCOUNTER — Ambulatory Visit: Payer: Medicare HMO | Attending: Physician Assistant | Admitting: Internal Medicine

## 2023-12-09 VITALS — BP 112/70 | HR 92 | Ht 65.0 in | Wt 129.8 lb

## 2023-12-09 DIAGNOSIS — I951 Orthostatic hypotension: Secondary | ICD-10-CM

## 2023-12-09 NOTE — Patient Instructions (Signed)
Medication Instructions:   *If you need a refill on your cardiac medications before your next appointment, please call your pharmacy*   Lab Work: Cortisol  If you have labs (blood work) drawn today and your tests are completely normal, you will receive your results only by: MyChart Message (if you have MyChart) OR A paper copy in the mail If you have any lab test that is abnormal or we need to change your treatment, we will call you to review the results.   Testing/Procedures:    Follow-Up: At [facility], you and your health needs are our priority.  As part of our continuing mission to provide you with exceptional heart care, we have created designated Provider Care Teams.  These Care Teams include your primary Cardiologist (physician) and Advanced Practice Providers (APPs -  Physician Assistants and Nurse Practitioners) who all work together to provide you with the care you need, when you need it.  We recommend signing up for the patient portal called "MyChart".  Sign up information is provided on this After Visit Summary.  MyChart is used to connect with patients for Virtual Visits (Telemedicine).  Patients are able to view lab/test results, encounter notes, upcoming appointments, etc.  Non-urgent messages can be sent to your provider as well.   To learn more about what you can do with MyChart, go to [email or web address].    Your next appointment:  Sept 2025

## 2023-12-10 ENCOUNTER — Other Ambulatory Visit: Payer: Self-pay | Admitting: Family Medicine

## 2023-12-10 ENCOUNTER — Other Ambulatory Visit: Payer: Self-pay | Admitting: Internal Medicine

## 2023-12-10 ENCOUNTER — Encounter: Payer: Self-pay | Admitting: Internal Medicine

## 2023-12-10 DIAGNOSIS — R531 Weakness: Secondary | ICD-10-CM | POA: Diagnosis not present

## 2023-12-10 DIAGNOSIS — R296 Repeated falls: Secondary | ICD-10-CM | POA: Diagnosis not present

## 2023-12-10 LAB — CORTISOL: Cortisol: 7.4 ug/dL (ref 6.2–19.4)

## 2023-12-12 DIAGNOSIS — R296 Repeated falls: Secondary | ICD-10-CM | POA: Diagnosis not present

## 2023-12-12 DIAGNOSIS — R531 Weakness: Secondary | ICD-10-CM | POA: Diagnosis not present

## 2023-12-24 ENCOUNTER — Encounter: Payer: Self-pay | Admitting: Internal Medicine

## 2023-12-25 DIAGNOSIS — C792 Secondary malignant neoplasm of skin: Secondary | ICD-10-CM | POA: Diagnosis not present

## 2023-12-25 DIAGNOSIS — C50911 Malignant neoplasm of unspecified site of right female breast: Secondary | ICD-10-CM | POA: Diagnosis not present

## 2023-12-25 DIAGNOSIS — G893 Neoplasm related pain (acute) (chronic): Secondary | ICD-10-CM | POA: Diagnosis not present

## 2023-12-25 DIAGNOSIS — N6489 Other specified disorders of breast: Secondary | ICD-10-CM | POA: Diagnosis not present

## 2024-01-09 DIAGNOSIS — C50911 Malignant neoplasm of unspecified site of right female breast: Secondary | ICD-10-CM | POA: Diagnosis not present

## 2024-01-09 DIAGNOSIS — Z7982 Long term (current) use of aspirin: Secondary | ICD-10-CM | POA: Diagnosis not present

## 2024-01-09 DIAGNOSIS — Z79899 Other long term (current) drug therapy: Secondary | ICD-10-CM | POA: Diagnosis not present

## 2024-01-10 DIAGNOSIS — Z7982 Long term (current) use of aspirin: Secondary | ICD-10-CM | POA: Diagnosis not present

## 2024-01-10 DIAGNOSIS — Z79899 Other long term (current) drug therapy: Secondary | ICD-10-CM | POA: Diagnosis not present

## 2024-01-10 DIAGNOSIS — C50911 Malignant neoplasm of unspecified site of right female breast: Secondary | ICD-10-CM | POA: Diagnosis not present

## 2024-01-15 DIAGNOSIS — Z5111 Encounter for antineoplastic chemotherapy: Secondary | ICD-10-CM | POA: Diagnosis not present

## 2024-01-15 DIAGNOSIS — C50911 Malignant neoplasm of unspecified site of right female breast: Secondary | ICD-10-CM | POA: Diagnosis not present

## 2024-01-15 DIAGNOSIS — G893 Neoplasm related pain (acute) (chronic): Secondary | ICD-10-CM | POA: Diagnosis not present

## 2024-01-15 DIAGNOSIS — K1232 Oral mucositis (ulcerative) due to other drugs: Secondary | ICD-10-CM | POA: Diagnosis not present

## 2024-01-15 DIAGNOSIS — T50995A Adverse effect of other drugs, medicaments and biological substances, initial encounter: Secondary | ICD-10-CM | POA: Diagnosis not present

## 2024-01-15 DIAGNOSIS — C792 Secondary malignant neoplasm of skin: Secondary | ICD-10-CM | POA: Diagnosis not present

## 2024-01-15 DIAGNOSIS — Z79899 Other long term (current) drug therapy: Secondary | ICD-10-CM | POA: Diagnosis not present

## 2024-01-15 DIAGNOSIS — M8580 Other specified disorders of bone density and structure, unspecified site: Secondary | ICD-10-CM | POA: Diagnosis not present

## 2024-01-16 ENCOUNTER — Encounter: Payer: Self-pay | Admitting: Family Medicine

## 2024-01-16 NOTE — Telephone Encounter (Signed)
 Tried calling Pt- call goes straight to voicemail. She is needing to be triaged. Q/o urinary retention.

## 2024-01-17 ENCOUNTER — Telehealth: Payer: Self-pay | Admitting: Neurology

## 2024-01-17 NOTE — Telephone Encounter (Signed)
 Copied from CRM [id number]. Topic: General - Other >> Jan 17, 2024  1:48 PM Gurney Maxin H wrote: Reason for CRM: Patients husband is calling to notify provider that patient is doing better with consuming fluids seems to be taking in more.

## 2024-01-17 NOTE — Telephone Encounter (Signed)
 Called patient back on spouses phone. She said she has increased her water intake and has gone to the bathroom three times today. She said she is going to continue to monitor it over the weekend and will le [place] know if it continues to improve.

## 2024-01-17 NOTE — Telephone Encounter (Signed)
 Called the phone number the patient provided in her message. Spouse did not answer. LVM

## 2024-01-17 NOTE — Telephone Encounter (Signed)
 Called patient back and she verified she has been drinking more water.

## 2024-01-21 DIAGNOSIS — R296 Repeated falls: Secondary | ICD-10-CM | POA: Diagnosis not present

## 2024-01-21 DIAGNOSIS — R531 Weakness: Secondary | ICD-10-CM | POA: Diagnosis not present

## 2024-01-27 ENCOUNTER — Other Ambulatory Visit: Payer: Self-pay | Admitting: Family Medicine

## 2024-01-27 DIAGNOSIS — R531 Weakness: Secondary | ICD-10-CM | POA: Diagnosis not present

## 2024-01-27 DIAGNOSIS — R296 Repeated falls: Secondary | ICD-10-CM | POA: Diagnosis not present

## 2024-01-29 DIAGNOSIS — Z5111 Encounter for antineoplastic chemotherapy: Secondary | ICD-10-CM | POA: Diagnosis not present

## 2024-01-29 DIAGNOSIS — C50911 Malignant neoplasm of unspecified site of right female breast: Secondary | ICD-10-CM | POA: Diagnosis not present

## 2024-02-03 ENCOUNTER — Encounter: Payer: Self-pay | Admitting: Family Medicine

## 2024-02-04 DIAGNOSIS — R296 Repeated falls: Secondary | ICD-10-CM | POA: Diagnosis not present

## 2024-02-04 DIAGNOSIS — R531 Weakness: Secondary | ICD-10-CM | POA: Diagnosis not present

## 2024-02-05 ENCOUNTER — Other Ambulatory Visit: Payer: Self-pay | Admitting: Internal Medicine

## 2024-02-07 ENCOUNTER — Telehealth: Payer: Self-pay | Admitting: Family Medicine

## 2024-02-07 DIAGNOSIS — R531 Weakness: Secondary | ICD-10-CM | POA: Diagnosis not present

## 2024-02-07 DIAGNOSIS — R296 Repeated falls: Secondary | ICD-10-CM | POA: Diagnosis not present

## 2024-02-07 NOTE — Telephone Encounter (Signed)
 Copied from CRM [id number]. Topic: Medicare AWV >> Feb 07, 2024  9:18 AM Payton Doughty wrote: Reason for CRM: Called LVM 02/06/2024 to schedule AWV. Please schedule Virtual or Telehealth visits ONLY.   Verlee Rossetti; Care Guide Ambulatory Clinical Support [place] [facility] Direct Dial: [phone]

## 2024-02-12 DIAGNOSIS — M8580 Other specified disorders of bone density and structure, unspecified site: Secondary | ICD-10-CM | POA: Diagnosis not present

## 2024-02-12 DIAGNOSIS — C792 Secondary malignant neoplasm of skin: Secondary | ICD-10-CM | POA: Diagnosis not present

## 2024-02-12 DIAGNOSIS — Z5111 Encounter for antineoplastic chemotherapy: Secondary | ICD-10-CM | POA: Diagnosis not present

## 2024-02-12 DIAGNOSIS — C50911 Malignant neoplasm of unspecified site of right female breast: Secondary | ICD-10-CM | POA: Diagnosis not present

## 2024-02-14 ENCOUNTER — Encounter: Payer: Self-pay | Admitting: Family Medicine

## 2024-02-14 DIAGNOSIS — R531 Weakness: Secondary | ICD-10-CM | POA: Diagnosis not present

## 2024-02-14 DIAGNOSIS — R296 Repeated falls: Secondary | ICD-10-CM | POA: Diagnosis not present

## 2024-02-17 NOTE — Telephone Encounter (Signed)
 Called and spoke with patient. She has an appointment scheduled for May 1st at 11:20 am

## 2024-02-17 NOTE — Telephone Encounter (Signed)
 Called and spoke with patient. She has been scheduled for May 1st at 11:20 am

## 2024-02-19 DIAGNOSIS — R531 Weakness: Secondary | ICD-10-CM | POA: Diagnosis not present

## 2024-02-19 DIAGNOSIS — R296 Repeated falls: Secondary | ICD-10-CM | POA: Diagnosis not present

## 2024-03-08 NOTE — Assessment & Plan Note (Signed)
 Encouraged good sleep hygiene such as dark, quiet room. No blue/green glowing lights such as computer screens in bedroom. No alcohol or stimulants in evening. Cut down on caffeine as able. Regular exercise is helpful but not just prior to bed time.

## 2024-03-08 NOTE — Assessment & Plan Note (Signed)
 [id number] acceptable, minimize simple carbs. Increase exercise as tolerated.

## 2024-03-08 NOTE — Assessment & Plan Note (Signed)
 Stable, ill continue to monitor

## 2024-03-08 NOTE — Assessment & Plan Note (Signed)
 Encourage heart healthy diet such as MIND or DASH diet, increase exercise, avoid trans fats, simple carbohydrates and processed foods, consider a krill or fish or flaxseed oil cap daily.

## 2024-03-08 NOTE — Assessment & Plan Note (Signed)
 She continues to follow closely with oncology.

## 2024-03-10 ENCOUNTER — Other Ambulatory Visit: Payer: Self-pay | Admitting: Internal Medicine

## 2024-03-12 ENCOUNTER — Encounter: Payer: Self-pay | Admitting: Family Medicine

## 2024-03-12 ENCOUNTER — Ambulatory Visit (INDEPENDENT_AMBULATORY_CARE_PROVIDER_SITE_OTHER): Admitting: Family Medicine

## 2024-03-12 VITALS — BP 112/64 | HR 96 | Resp 16 | Ht 65.0 in | Wt 137.2 lb

## 2024-03-12 DIAGNOSIS — R0789 Other chest pain: Secondary | ICD-10-CM | POA: Diagnosis not present

## 2024-03-12 DIAGNOSIS — R3911 Hesitancy of micturition: Secondary | ICD-10-CM | POA: Diagnosis not present

## 2024-03-12 DIAGNOSIS — E782 Mixed hyperlipidemia: Secondary | ICD-10-CM | POA: Diagnosis not present

## 2024-03-12 DIAGNOSIS — C50911 Malignant neoplasm of unspecified site of right female breast: Secondary | ICD-10-CM | POA: Diagnosis not present

## 2024-03-12 DIAGNOSIS — R002 Palpitations: Secondary | ICD-10-CM | POA: Diagnosis not present

## 2024-03-12 DIAGNOSIS — C792 Secondary malignant neoplasm of skin: Secondary | ICD-10-CM | POA: Diagnosis not present

## 2024-03-12 DIAGNOSIS — G4700 Insomnia, unspecified: Secondary | ICD-10-CM | POA: Diagnosis not present

## 2024-03-12 DIAGNOSIS — T451X5A Adverse effect of antineoplastic and immunosuppressive drugs, initial encounter: Secondary | ICD-10-CM

## 2024-03-12 DIAGNOSIS — R739 Hyperglycemia, unspecified: Secondary | ICD-10-CM | POA: Diagnosis not present

## 2024-03-12 DIAGNOSIS — G620 Drug-induced polyneuropathy: Secondary | ICD-10-CM

## 2024-03-12 LAB — URINALYSIS, ROUTINE W REFLEX MICROSCOPIC
Bilirubin Urine: NEGATIVE
Ketones, ur: NEGATIVE
Nitrite: NEGATIVE
Specific Gravity, Urine: <=1.005 — AB (ref 1.000–1.030)
Total Protein, Urine: NEGATIVE
Urine Glucose: NEGATIVE
Urobilinogen, UA: 0.2 (ref 0.0–1.0)
pH: 6 (ref 5.0–8.0)

## 2024-03-12 MED ORDER — MECLIZINE HCL 12.5 MG PO TABS
12.5000 mg | ORAL_TABLET | Freq: Three times a day (TID) | ORAL | 1 refills | Status: DC | PRN
Start: 2024-03-12 — End: 2024-06-26

## 2024-03-12 MED ORDER — DOXYCYCLINE HYCLATE 100 MG PO TABS: 100.0000 mg | ORAL_TABLET | Freq: Two times a day (BID) | ORAL | 0 refills | Status: DC

## 2024-03-12 NOTE — Progress Notes (Signed)
 Subjective:    Patient ID: Anne [name], female    DOB: Mar 03, 1941, 83 y.o.   MRN: [id number]  Chief Complaint  Patient presents with   Medical Management of Chronic Issues    Patient presents today for a 3 month follow-up   Quality Metric Gaps    AWV & zoster    HPI Discussed the use of AI scribe software for clinical note transcription with the patient, who gave verbal consent to proceed.  History of Present Illness Anne [name] "Anne [name]" is an 83 year old female who presents with urinary hesitancy and other health concerns.  She experiences difficulty initiating urination, often needing to turn on the faucet to help start the flow. Drinking a lot of water seems to ease urination. No burning, blood in urine, fevers, or chills. Her last kidney function tests, including BUN, creatinine, and GFR, were normal, with the most recent tests being 5 to 10 months ago.  She takes amitriptyline  at night and has added lorazepam  to help with sleep. Initially, one lorazepam  was effective, but she now takes one and a half due to difficulty sleeping. She usually eats between 8 and 9 PM and goes to bed around midnight.  She experienced a single episode of vertigo while lying on the floor and then sitting up, during which the ceiling appeared to wobble. The episode lasted several minutes without nausea or vomiting. She has been attending physical therapy, which she finds beneficial.  She receives injections for her cancer, initially every two weeks and now monthly. She is due for her next injection next week. She mentions concerns about the cancer developing resistance to treatment.  She had a tick bite while working in the yard, which became irritated after she removed the tick's head a day or two later. She has been applying alcohol and triple antibiotic ointment to the site.  She describes experiencing right-sided chest pain associated with movement, which she describes as a 'terrible  tightness.' The pain occurs with movement and is not constant. She has not previously used muscle relaxers for this issue.  She has been attending physical therapy regularly and finds it energizing and beneficial for her overall well-being.    Past Medical History:  Diagnosis Date   ALKALINE PHOSPHATASE, ELEVATED 03/15/2009   Allergic state 06/10/2012   Allergy     Anemia    Anxiety and depression 04/28/2011   Arthritis    Asthma    Atypical chest pain 11/30/2011   AVM (arteriovenous malformation) of colon 2011   cecum   Baker's cyst of knee 05/22/2011   Cancer (HCC) 11/2006   XRT/chemo 01-02/ lobular invasive ca   Carotid artery disease (HCC)    a. Carotid duplex 03/2014: stable 1-39% BICA, f/u due 03/2016.   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     Clotting disorder (HCC)    COPD (chronic obstructive pulmonary disease) (HCC)    Dementia (HCC)    Dermatitis 11/23/2012   Dyspnea    EE (eosinophilic esophagitis)    Emphysema of lung (HCC)    Esophageal ring    ESOPHAGEAL STRICTURE 03/29/2009   Fall 11/23/2012   Family history of breast cancer    Family history of colon cancer    Family history of ovarian cancer    Family history of pancreatic cancer    Folliculitis of nose 12/31/2011   GERD (gastroesophageal reflux disease) 09/29/2009   improved s/p  cholecystectomy and esophagus dilatation   History of chicken pox    History of measles    History of shingles    2 episodes   Hx of echocardiogram    a. Echo 01/2013: mild LVH, EF 55-60%, normal wall motion, Gr 1 diast dysfn   Hyperlipidemia    Hypertension    Knee pain, bilateral 07/23/2011   Medicare annual wellness visit, subsequent 06/19/2015   Mixed hyperlipidemia 10/17/2010   Qualifier: Diagnosis of  By: Arthor Laser     Orthostasis    Osteopenia 03/14/2011   Osteoporosis    Personal history of chemotherapy 2001   Personal history  of radiation therapy 2001   rt breast   PERSONAL HX BREAST CANCER 09/29/2009   Pneumonia    PVC's (premature ventricular contractions)    a. Event monitor 01/2013: NSR, extensive PVCs.   Radial neck fracture 10/2011   minimally displaced   Substance abuse (HCC)    In remission 3 years.    Urinary incontinence 03/19/2012   Vaginitis 05/22/2011    Past Surgical History:  Procedure Laterality Date   APPENDECTOMY     AUGMENTATION MAMMAPLASTY Right 05/27/2007   BREAST BIOPSY Right 01/02/2007   wire loc   BREAST BIOPSY  12/26/2006   BREAST LUMPECTOMY Right 2001   BREAST RECONSTRUCTION  2008, 2009, 2010   BREAST REDUCTION WITH MASTOPEXY Left 05/30/2017   Procedure: LEFT BREAST REDUCTION FOR SYMTERY WITH MASTOPEXY;  Surgeon: Thornell Flirt, DO;  Location: [facility];  Service: Plastics;  Laterality: Left;   CHOLECYSTECTOMY  2010   COLONOSCOPY  09/05/10   [facility]   DENTAL SURGERY  05/2016   4 dental implants by Dr. Jac Martes.   ERCP  2010    CBD stone extraction    ESOPHAGOGASTRODUODENOSCOPY  01/08/2012   Procedure: ESOPHAGOGASTRODUODENOSCOPY (EGD);  Surgeon: Kenney Peacemaker, MD;  Location: [facility];  Service: Endoscopy;  Laterality: N/A;   ESOPHAGOGASTRODUODENOSCOPY (EGD) WITH ESOPHAGEAL DILATION  2010, 2012   LAPAROSCOPIC APPENDECTOMY N/A 08/04/2016   Procedure: APPENDECTOMY LAPAROSCOPIC;  Surgeon: Candyce Champagne, MD;  Location: [facility];  Service: General;  Laterality: N/A;   LIPOSUCTION WITH LIPOFILLING Left 11/21/2017   Procedure: LIPOSUCTION FROM ABDOMEN WITH LIPOFILLING TO LEFT BREAST;  Surgeon: Thornell Flirt, DO;  Location: [facility];  Service: Plastics;  Laterality: Left;   MASTECTOMY MODIFIED RADICAL Right 05/27/2007   , Mastectomy modified radical (08), breast reconstruction, CA lesions excised lateral abd wall 2010   MASTOPEXY Left 11/21/2017   Procedure: LEFT BREAST REVISION MASTOPEXY FOR SYMMETRY;  Surgeon: Thornell Flirt, DO;   Location: [facility];  Service: Plastics;  Laterality: Left;   REDUCTION MAMMAPLASTY Left    SAVORY DILATION  01/08/2012   Procedure: SAVORY DILATION;  Surgeon: Kenney Peacemaker, MD;  Location: [facility];  Service: Endoscopy;  Laterality: N/A;  need xray    Family History  Problem Relation Age of Onset   Other Mother        tic [place]   Heart disease Father    Lung cancer Father        smoker   Cirrhosis Sister        Primary Biliary   Anxiety disorder Sister    Osteoporosis Sister    Arthritis Sister        Rheumatoid   Osteoporosis Sister    Skin cancer Sister        multiple skin cancers, over 90 excisions.   Stroke  Maternal Grandmother    Alcohol abuse Maternal Grandfather    Heart disease Paternal Grandfather    Esophageal cancer Paternal Grandfather    Rectal cancer Paternal Grandfather    Breast cancer Maternal Aunt        dx in her 80s   Breast cancer Maternal Aunt        dx in her 6s   Breast cancer Maternal Aunt        possible breast cancer dx and died in her 44s   Ovarian cancer Maternal Aunt 29   Pancreatic cancer Maternal Aunt        dx in her 43s   Pancreatic cancer Maternal Uncle        dx in his 34s; smoker   Stomach cancer Maternal Uncle    Cancer Maternal Uncle    Breast cancer Paternal Aunt        dx in her 81s   Lung cancer Paternal Aunt    Breast cancer Cousin        paternal first cousin   Breast cancer Cousin        maternal first cousin   Anesthesia problems Neg Hx    Hypotension Neg Hx    Malignant hyperthermia Neg Hx    Pseudochol deficiency Neg Hx     Social History   Socioeconomic History   Marital status: Married    Spouse name: Sam   Number of children: 3   Years of education: Not on file   Highest education level: 12th grade  Occupational History   Occupation: Arboriculturist  Tobacco Use   Smoking status: Former    Current packs/day: 0.00    Average packs/day: 1 pack/day for 50.0 years (50.0 ttl pk-yrs)     Types: Cigarettes    Start date: 05/21/1957    Quit date: 05/22/2007    Years since quitting: 16.8   Smokeless tobacco: Never  Vaping Use   Vaping status: Never Used  Substance and Sexual Activity   Alcohol use: Not Currently   Drug use: No   Sexual activity: Yes    Partners: Male    Comment: lives with husband,   Other Topics Concern   Not on file  Social History Narrative   Not on file   Social Drivers of Health   Financial Resource Strain: Low Risk  (03/11/2024)   Overall Financial Resource Strain (CARDIA)    Difficulty of Paying Living Expenses: Not very hard  Food Insecurity: No Food Insecurity (03/11/2024)   Hunger Vital Sign    Worried About Running Out of Food in the Last Year: Never true    Ran Out of Food in the Last Year: Never true  Transportation Needs: No Transportation Needs (03/11/2024)   PRAPARE - Administrator, Civil Service (Medical): No    Lack of Transportation (Non-Medical): No  Physical Activity: Insufficiently Active (03/11/2024)   Exercise Vital Sign    Days of Exercise per Week: 3 days    Minutes of Exercise per Session: 30 min  Stress: No Stress Concern Present (03/11/2024)   [employer] of Occupational Health - Occupational Stress Questionnaire    Feeling of Stress : Only a little  Social Connections: Moderately Integrated (03/11/2024)   Social Connection and Isolation Panel [NHANES]    Frequency of Communication with Friends and Family: Three times a week    Frequency of Social Gatherings with Friends and Family: Three times a week    Attends Religious Services: 1 to  4 times per year    Active Member of Clubs or Organizations: No    Attends Banker Meetings: Not on file    Marital Status: Married  Intimate Partner Violence: Not At Risk (03/22/2023)   Humiliation, Afraid, Rape, and Kick questionnaire    Fear of Current or Ex-Partner: No    Emotionally Abused: No    Physically Abused: No    Sexually Abused: No     Outpatient Medications Prior to Visit  Medication Sig Dispense Refill   acetaminophen  (TYLENOL ) 500 MG tablet Take 500-1,000 mg by mouth every 6 (six) hours as needed for moderate pain.     amitriptyline  (ELAVIL ) 25 MG tablet TAKE 4 TABLETS BY MOUTH DAILY AT BEDTIME 120 tablet 5   aspirin  81 MG EC tablet Take 81 mg by mouth daily.     atorvastatin  (LIPITOR) 20 MG tablet TAKE 1 TABLET BY MOUTH IN THE MORNING AND AT BEDTIME 180 tablet 2   Biotin 5 MG CAPS Take 5 mg by mouth daily.     Brimonidine Tartrate (LUMIFY) 0.025 % SOLN Place 1 drop into both eyes daily.     Cholecalciferol (VITAMIN D3) 1000 UNITS CAPS Take 2,000 Units by mouth daily.     Cyanocobalamin  (VITAMIN B 12 PO) Take 2 tablets by mouth daily.     diltiazem  (CARDIZEM ) 30 MG tablet As needed 90 tablet 1   FLUoxetine  (PROZAC ) 40 MG capsule TAKE 1 CAPSULE(40 MG) BY MOUTH DAILY 90 capsule 0   Folic Acid  (FOLATE PO) Take 1 tablet by mouth daily.     ipratropium (ATROVENT ) 0.06 % nasal spray Place 2 sprays into both nostrils 4 (four) times daily. 15 mL 0   LORazepam  (ATIVAN ) 1 MG tablet TAKE 1 TABLET(1 MG) BY MOUTH EVERY 8 HOURS AS NEEDED FOR ANXIETY 90 tablet 5   pantoprazole  (PROTONIX ) 40 MG tablet TAKE 1 TABLET(40 MG) BY MOUTH TWICE DAILY BEFORE A MEAL 180 tablet 0   Pyridoxine HCl (B-6 PO) Take 1 tablet by mouth daily.     Thiamine  HCl (B-1 PO) Take 1 tablet by mouth daily.     capecitabine (XELODA) 150 MG tablet Take 600-900 mg by mouth See admin instructions. Take 900 mg by mouth in the morning and 600 mg in the evening daily every other week.     diclofenac  Sodium (VOLTAREN  ARTHRITIS PAIN) 1 % GEL Apply 2 g topically daily as needed (hip pain).     nystatin -triamcinolone  ointment (MYCOLOG) Apply topically 3 (three) times daily.     triamcinolone  cream (KENALOG ) 0.1 % Apply 1 Application topically 2 (two) times daily. 30 g 0   No facility-administered medications prior to visit.    Allergies  Allergen Reactions    Sorbitol Other (See Comments)    GI Issues   Tomato Diarrhea   Zofran  Other (See Comments)    headache   Advil  [Ibuprofen ] Other (See Comments)    Irritates throat.   Cephalexin  Other (See Comments)    dizziness   Diphenhydramine  Hcl Other (See Comments)    "nervous and upset" does okay w/ cream   Morphine  And Codeine Other (See Comments)    Causes depression   Tramadol  Other (See Comments)    Dizziness   Antihistamines, Chlorpheniramine-Type Anxiety   Oxycodone  Anxiety    Confusion with inability to think clearly    Review of Systems  Constitutional:  Positive for malaise/fatigue. Negative for fever.  HENT:  Negative for congestion.   Eyes:  Negative for blurred vision.  Respiratory:  Negative for shortness of breath.   Cardiovascular:  Negative for chest pain, palpitations and leg swelling.  Gastrointestinal:  Negative for abdominal pain, blood in stool and nausea.  Genitourinary:  Negative for dysuria and frequency.  Musculoskeletal:  Negative for falls.  Skin:  Negative for rash.  Neurological:  Negative for dizziness, loss of consciousness and headaches.  Endo/Heme/Allergies:  Negative for environmental allergies.  Psychiatric/Behavioral:  Positive for depression. The patient is not nervous/anxious.        Objective:    Physical Exam  BP 112/64   Pulse 96   Resp 16   Ht 5\' 5"  (1.651 m)   Wt 137 lb 3.2 oz (62.2 kg)   SpO2 94%   BMI 22.83 kg/m  Wt Readings from Last 3 Encounters:  03/12/24 137 lb 3.2 oz (62.2 kg)  12/09/23 129 lb 12.8 oz (58.9 kg)  11/19/23 130 lb (59 kg)    Diabetic Foot Exam - Simple   No data filed    Lab Results  Component Value Date   WBC 7.2 11/07/2023   HGB 11.0 (L) 11/07/2023   HCT 33.3 (L) 11/07/2023   PLT 274 11/07/2023   GLUCOSE 87 11/07/2023   CHOL 143 04/04/2023   TRIG 166.0 (H) 04/04/2023   HDL 65.70 04/04/2023   LDLDIRECT 117.4 01/13/2013   LDLCALC 44 04/04/2023   ALT 17 04/24/2023   AST 18 04/24/2023   NA 144  11/07/2023   K 4.5 11/07/2023   CL 105 11/07/2023   CREATININE 0.65 11/07/2023   BUN 16 11/07/2023   CO2 26 11/07/2023   TSH 2.160 11/07/2023   INR 1.02 08/12/2017   HGBA1C 6.0 04/04/2023    Lab Results  Component Value Date   TSH 2.160 11/07/2023   Lab Results  Component Value Date   WBC 7.2 11/07/2023   HGB 11.0 (L) 11/07/2023   HCT 33.3 (L) 11/07/2023   MCV 103 (H) 11/07/2023   PLT 274 11/07/2023   Lab Results  Component Value Date   NA 144 11/07/2023   K 4.5 11/07/2023   CHLORIDE 104 09/04/2017   CO2 26 11/07/2023   GLUCOSE 87 11/07/2023   BUN 16 11/07/2023   CREATININE 0.65 11/07/2023   BILITOT 0.4 04/24/2023   ALKPHOS 67 04/24/2023   AST 18 04/24/2023   ALT 17 04/24/2023   PROT 6.6 04/24/2023   ALBUMIN 3.8 04/24/2023   CALCIUM  9.2 11/07/2023   ANIONGAP 9 10/22/2021   EGFR 88 11/07/2023   GFR 78.85 04/24/2023   Lab Results  Component Value Date   CHOL 143 04/04/2023   Lab Results  Component Value Date   HDL 65.70 04/04/2023   Lab Results  Component Value Date   LDLCALC 44 04/04/2023   Lab Results  Component Value Date   TRIG 166.0 (H) 04/04/2023   Lab Results  Component Value Date   CHOLHDL 2 04/04/2023   Lab Results  Component Value Date   HGBA1C 6.0 04/04/2023       Assessment & Plan:  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   Chemotherapy-induced neuropathy (HCC) Assessment & Plan: Stable, ill continue to monitor   Hyperglycemia Assessment & Plan: hgba1c acceptable, minimize simple carbs. Increase exercise as tolerated.   Orders: -     Comprehensive metabolic panel with GFR; Future  Mixed hyperlipidemia Assessment & Plan: Encourage heart healthy diet such as MIND or DASH diet, increase exercise, avoid  trans fats, simple carbohydrates and processed foods, consider a krill or fish or flaxseed oil cap daily.    Orders: -     Lipid panel;  Future  Insomnia, unspecified type Assessment & Plan: Encouraged good sleep hygiene such as dark, quiet room. No blue/green glowing lights such as computer screens in bedroom. No alcohol or stimulants in evening. Cut down on caffeine as able. Regular exercise is helpful but not just prior to bed time.     Urinary hesitancy -     Ambulatory referral to Urogynecology -     CBC with Differential/Platelet; Future -     Urinalysis, Routine w reflex microscopic -     Urine Culture  Palpitations -     TSH; Future  Right-sided chest wall pain -     Ambulatory referral to Physical Therapy  Other orders -     Meclizine  HCl; Take 1 tablet (12.5 mg total) by mouth 3 (three) times daily as needed for dizziness.  Dispense: 20 tablet; Refill: 1 -     Doxycycline  Hyclate; Take 1 tablet (100 mg total) by mouth 2 (two) times daily.  Dispense: 20 tablet; Refill: 0    Assessment and Plan Assessment & Plan Urinary hesitancy Difficulty initiating urination likely due to weakened bladder muscles and pelvic floor changes. No infection or kidney issues based on labs. - Refer to urogynecologist for evaluation. - Order urine sample to rule out low-grade infection.  Tick bite with secondary infection Tick bite with mild secondary infection. Initial removal attempt left head embedded, later removed. Area treated with alcohol and triple antibiotic ointment. - Prescribe doxycycline  to treat skin infection and cover potential tick-borne illnesses.  Breast cancer Ongoing treatment with monthly injections. Discussed potential resistance to current treatment and future options, including experimental therapies. - Continue current injection schedule.  Insomnia Difficulty sleeping despite amitriptyline  and lorazepam . Increased lorazepam  dosage to 1.5 mg at bedtime. Discussed risks of excessive sedation and strategies to mitigate risk by staggering doses. - Advise taking 0.5 mg lorazepam  after dinner and 1 mg at  bedtime with amitriptyline .  Vertigo Single episode likely related to positional changes and dehydration. Discussed importance of hydration and slow positional changes to prevent recurrence. - Advise maintaining hydration with 5-10 ounces of water every 1-2 hours while awake. - Prescribe meclizine  for symptomatic relief if vertigo recurs.  Chest wall pain Intermittent right-sided chest pain associated with movement, likely musculoskeletal, possibly costochondritis or scar tissue spasm. Not cardiac. - Refer to physical therapy for evaluation and management.  Emphysema Emphysema may contribute to increased muscle strain during respiration.     Randie Bustle, MD

## 2024-03-12 NOTE — Patient Instructions (Addendum)
 Water/fluids need 5-10 ounces every 1-2 hours, max of 60-80 ounces a day.    Dizziness Dizziness is a common problem. It makes you feel unsteady or light-headed. You may feel like you're about to faint. Dizziness can lead to getting hurt if you stumble or fall. It's more common to feel dizzy if you're an older adult. Many things can cause you to feel dizzy. These include: Medicines. Dehydration. This is when there's not enough water in your body. Illness. Follow these instructions at home: Eating and drinking  Drink enough fluid to keep your pee (urine) pale yellow. This helps keep you from getting dehydrated. Try to drink more clear fluids, such as water. Do not drink alcohol. Try to limit how much caffeine you take in. Try to limit how much salt, also called sodium, you take in. Activity Try not to make quick movements. Stand up slowly from sitting in a chair. Steady yourself until you feel okay. In the morning, first sit up on the side of the bed. When you feel okay, hold onto something and slowly stand up. Do this until you know that your balance is okay. If you need to stand in one place for a long time, move your legs often. Tighten and relax the muscles in your legs while you're standing. Do not drive or use machines if you feel dizzy. Avoid bending down if you feel dizzy. Place items in your home so you can reach them without leaning over. Lifestyle Do not smoke, vape, or use products with nicotine or tobacco in them. If you need help quitting, talk with your health care provider. Try to lower your stress level. You can do this by using methods like yoga or meditation. Talk with your provider if you need help. General instructions Watch your dizziness for any changes. Take your medicines only as told by your provider. Talk with your provider if you think you're dizzy because of a medicine you're taking. Tell a friend or a family member that you're feeling dizzy. If they spot  any changes in your behavior, have them call your provider. Contact a health care provider if: Your dizziness doesn't go away, or you have new symptoms. Your dizziness gets worse. You feel like you may vomit. You have trouble hearing. You have a fever. You have neck pain or a stiff neck. You fall or get hurt. Get help right away if: You vomit each time you eat or drink. You have watery poop and can't eat or drink. You have trouble talking, walking, swallowing, or using your arms, hands, or legs. You feel very weak. You're bleeding. You're not thinking clearly, or you have trouble forming sentences. A friend or family member may spot this. Your vision changes, or you get a very bad headache. These symptoms may be an emergency. Call 911 right away. Do not wait to see if the symptoms will go away. Do not drive yourself to the hospital. This information is not intended to replace advice given to you by your health care provider. Make sure you discuss any questions you have with your health care provider. Document Revised: 08/01/2023 Document Reviewed: 12/13/2022 Elsevier Patient Education  2024 [employer].

## 2024-03-13 LAB — URINE CULTURE
MICRO NUMBER:: 16400652
SPECIMEN QUALITY:: ADEQUATE

## 2024-03-14 ENCOUNTER — Other Ambulatory Visit: Payer: Self-pay | Admitting: Internal Medicine

## 2024-03-14 ENCOUNTER — Other Ambulatory Visit: Payer: Self-pay | Admitting: Family Medicine

## 2024-03-14 DIAGNOSIS — F322 Major depressive disorder, single episode, severe without psychotic features: Secondary | ICD-10-CM

## 2024-03-15 ENCOUNTER — Encounter: Payer: Self-pay | Admitting: Family Medicine

## 2024-03-16 ENCOUNTER — Encounter: Payer: Self-pay | Admitting: *Deleted

## 2024-03-18 ENCOUNTER — Other Ambulatory Visit: Payer: Self-pay | Admitting: Family Medicine

## 2024-03-18 DIAGNOSIS — Z79818 Long term (current) use of other agents affecting estrogen receptors and estrogen levels: Secondary | ICD-10-CM | POA: Diagnosis not present

## 2024-03-18 DIAGNOSIS — Z1731 Human epidermal growth factor receptor 2 positive status: Secondary | ICD-10-CM | POA: Diagnosis not present

## 2024-03-18 DIAGNOSIS — Z5111 Encounter for antineoplastic chemotherapy: Secondary | ICD-10-CM | POA: Diagnosis not present

## 2024-03-18 DIAGNOSIS — G893 Neoplasm related pain (acute) (chronic): Secondary | ICD-10-CM | POA: Diagnosis not present

## 2024-03-18 DIAGNOSIS — F322 Major depressive disorder, single episode, severe without psychotic features: Secondary | ICD-10-CM

## 2024-03-18 DIAGNOSIS — Z1721 Progesterone receptor positive status: Secondary | ICD-10-CM | POA: Diagnosis not present

## 2024-03-18 DIAGNOSIS — C50911 Malignant neoplasm of unspecified site of right female breast: Secondary | ICD-10-CM | POA: Diagnosis not present

## 2024-03-18 DIAGNOSIS — M8580 Other specified disorders of bone density and structure, unspecified site: Secondary | ICD-10-CM | POA: Diagnosis not present

## 2024-03-18 DIAGNOSIS — N6489 Other specified disorders of breast: Secondary | ICD-10-CM | POA: Diagnosis not present

## 2024-03-18 DIAGNOSIS — C792 Secondary malignant neoplasm of skin: Secondary | ICD-10-CM | POA: Diagnosis not present

## 2024-03-18 DIAGNOSIS — Z170 Estrogen receptor positive status [ER+]: Secondary | ICD-10-CM | POA: Diagnosis not present

## 2024-03-18 MED ORDER — FLUOXETINE HCL 20 MG PO CAPS: 60.0000 mg | ORAL_CAPSULE | Freq: Every day | ORAL | 3 refills | Status: AC

## 2024-03-27 ENCOUNTER — Telehealth: Payer: Self-pay | Admitting: Family Medicine

## 2024-03-27 NOTE — Telephone Encounter (Signed)
 Copied from CRM [id number]. Topic: Medicare AWV >> Mar 27, 2024  1:15 PM Juliana Ocean wrote: Reason for CRM: LVM 03/27/2024 to schedule AWV. Please schedule Virtual or Telehealth visits ONLY  Rosalee Collins; Care Guide Ambulatory Clinical Support [facility] Direct Dial: [phone]

## 2024-04-03 ENCOUNTER — Encounter: Payer: Self-pay | Admitting: Family Medicine

## 2024-04-17 DIAGNOSIS — R7989 Other specified abnormal findings of blood chemistry: Secondary | ICD-10-CM | POA: Diagnosis not present

## 2024-04-17 DIAGNOSIS — K838 Other specified diseases of biliary tract: Secondary | ICD-10-CM | POA: Diagnosis not present

## 2024-04-17 DIAGNOSIS — R932 Abnormal findings on diagnostic imaging of liver and biliary tract: Secondary | ICD-10-CM | POA: Diagnosis not present

## 2024-04-17 DIAGNOSIS — Z5111 Encounter for antineoplastic chemotherapy: Secondary | ICD-10-CM | POA: Diagnosis not present

## 2024-04-17 DIAGNOSIS — C792 Secondary malignant neoplasm of skin: Secondary | ICD-10-CM | POA: Diagnosis not present

## 2024-04-17 DIAGNOSIS — C50911 Malignant neoplasm of unspecified site of right female breast: Secondary | ICD-10-CM | POA: Diagnosis not present

## 2024-04-17 DIAGNOSIS — M8580 Other specified disorders of bone density and structure, unspecified site: Secondary | ICD-10-CM | POA: Diagnosis not present

## 2024-04-17 DIAGNOSIS — Z79810 Long term (current) use of selective estrogen receptor modulators (SERMs): Secondary | ICD-10-CM | POA: Diagnosis not present

## 2024-04-17 DIAGNOSIS — N6489 Other specified disorders of breast: Secondary | ICD-10-CM | POA: Diagnosis not present

## 2024-04-17 DIAGNOSIS — R197 Diarrhea, unspecified: Secondary | ICD-10-CM | POA: Diagnosis not present

## 2024-04-23 DIAGNOSIS — R7989 Other specified abnormal findings of blood chemistry: Secondary | ICD-10-CM | POA: Diagnosis not present

## 2024-04-23 DIAGNOSIS — R932 Abnormal findings on diagnostic imaging of liver and biliary tract: Secondary | ICD-10-CM | POA: Diagnosis not present

## 2024-04-23 DIAGNOSIS — C792 Secondary malignant neoplasm of skin: Secondary | ICD-10-CM | POA: Diagnosis not present

## 2024-04-23 DIAGNOSIS — C50911 Malignant neoplasm of unspecified site of right female breast: Secondary | ICD-10-CM | POA: Diagnosis not present

## 2024-05-01 DIAGNOSIS — C50911 Malignant neoplasm of unspecified site of right female breast: Secondary | ICD-10-CM | POA: Diagnosis not present

## 2024-05-13 DIAGNOSIS — C792 Secondary malignant neoplasm of skin: Secondary | ICD-10-CM | POA: Diagnosis not present

## 2024-05-13 DIAGNOSIS — Z17411 Hormone receptor positive with human epidermal growth factor receptor 2 negative status: Secondary | ICD-10-CM | POA: Diagnosis not present

## 2024-05-13 DIAGNOSIS — M8580 Other specified disorders of bone density and structure, unspecified site: Secondary | ICD-10-CM | POA: Diagnosis not present

## 2024-05-13 DIAGNOSIS — R197 Diarrhea, unspecified: Secondary | ICD-10-CM | POA: Diagnosis not present

## 2024-05-13 DIAGNOSIS — Z5111 Encounter for antineoplastic chemotherapy: Secondary | ICD-10-CM | POA: Diagnosis not present

## 2024-05-13 DIAGNOSIS — C50911 Malignant neoplasm of unspecified site of right female breast: Secondary | ICD-10-CM | POA: Diagnosis not present

## 2024-05-13 DIAGNOSIS — N6320 Unspecified lump in the left breast, unspecified quadrant: Secondary | ICD-10-CM | POA: Diagnosis not present

## 2024-05-25 DIAGNOSIS — R932 Abnormal findings on diagnostic imaging of liver and biliary tract: Secondary | ICD-10-CM | POA: Diagnosis not present

## 2024-05-25 DIAGNOSIS — R7989 Other specified abnormal findings of blood chemistry: Secondary | ICD-10-CM | POA: Diagnosis not present

## 2024-05-27 DIAGNOSIS — C50911 Malignant neoplasm of unspecified site of right female breast: Secondary | ICD-10-CM | POA: Diagnosis not present

## 2024-05-27 DIAGNOSIS — C792 Secondary malignant neoplasm of skin: Secondary | ICD-10-CM | POA: Diagnosis not present

## 2024-06-05 ENCOUNTER — Other Ambulatory Visit: Payer: Self-pay | Admitting: Family Medicine

## 2024-06-05 DIAGNOSIS — K2100 Gastro-esophageal reflux disease with esophagitis, without bleeding: Secondary | ICD-10-CM | POA: Diagnosis not present

## 2024-06-05 DIAGNOSIS — R0602 Shortness of breath: Secondary | ICD-10-CM | POA: Diagnosis not present

## 2024-06-09 DIAGNOSIS — C50911 Malignant neoplasm of unspecified site of right female breast: Secondary | ICD-10-CM | POA: Diagnosis not present

## 2024-06-10 DIAGNOSIS — Z1732 Human epidermal growth factor receptor 2 negative status: Secondary | ICD-10-CM | POA: Diagnosis not present

## 2024-06-10 DIAGNOSIS — C50911 Malignant neoplasm of unspecified site of right female breast: Secondary | ICD-10-CM | POA: Diagnosis not present

## 2024-06-10 DIAGNOSIS — R7989 Other specified abnormal findings of blood chemistry: Secondary | ICD-10-CM | POA: Diagnosis not present

## 2024-06-10 DIAGNOSIS — Z5111 Encounter for antineoplastic chemotherapy: Secondary | ICD-10-CM | POA: Diagnosis not present

## 2024-06-10 DIAGNOSIS — Z9181 History of falling: Secondary | ICD-10-CM | POA: Diagnosis not present

## 2024-06-10 DIAGNOSIS — C792 Secondary malignant neoplasm of skin: Secondary | ICD-10-CM | POA: Diagnosis not present

## 2024-06-10 DIAGNOSIS — J439 Emphysema, unspecified: Secondary | ICD-10-CM | POA: Diagnosis not present

## 2024-06-10 DIAGNOSIS — M8580 Other specified disorders of bone density and structure, unspecified site: Secondary | ICD-10-CM | POA: Diagnosis not present

## 2024-06-19 DIAGNOSIS — H43811 Vitreous degeneration, right eye: Secondary | ICD-10-CM | POA: Diagnosis not present

## 2024-06-19 DIAGNOSIS — H26491 Other secondary cataract, right eye: Secondary | ICD-10-CM | POA: Diagnosis not present

## 2024-06-19 DIAGNOSIS — H04123 Dry eye syndrome of bilateral lacrimal glands: Secondary | ICD-10-CM | POA: Diagnosis not present

## 2024-06-19 DIAGNOSIS — H31091 Other chorioretinal scars, right eye: Secondary | ICD-10-CM | POA: Diagnosis not present

## 2024-06-19 DIAGNOSIS — H353132 Nonexudative age-related macular degeneration, bilateral, intermediate dry stage: Secondary | ICD-10-CM | POA: Diagnosis not present

## 2024-06-19 DIAGNOSIS — H3581 Retinal edema: Secondary | ICD-10-CM | POA: Diagnosis not present

## 2024-06-22 ENCOUNTER — Ambulatory Visit (INDEPENDENT_AMBULATORY_CARE_PROVIDER_SITE_OTHER)

## 2024-06-22 ENCOUNTER — Ambulatory Visit
Admission: EM | Admit: 2024-06-22 | Discharge: 2024-06-22 | Disposition: A | Discharge status: Home / Self Care | Attending: Internal Medicine | Admitting: Internal Medicine

## 2024-06-22 ENCOUNTER — Other Ambulatory Visit: Payer: Self-pay

## 2024-06-22 ENCOUNTER — Ambulatory Visit: Payer: Self-pay | Admitting: Internal Medicine

## 2024-06-22 DIAGNOSIS — R09A2 Foreign body sensation, throat: Secondary | ICD-10-CM

## 2024-06-22 DIAGNOSIS — Z03822 Encounter for observation for suspected aspirated (inhaled) foreign body ruled out: Secondary | ICD-10-CM | POA: Diagnosis not present

## 2024-06-22 DIAGNOSIS — Z03821 Encounter for observation for suspected ingested foreign body ruled out: Secondary | ICD-10-CM | POA: Diagnosis not present

## 2024-06-22 MED ORDER — LIDOCAINE VISCOUS HCL 2 % MT SOLN
15.0000 mL | Freq: Once | OROMUCOSAL | Status: AC
  Administered 2024-06-22 (×2): 15 mL via OROMUCOSAL

## 2024-06-22 MED ORDER — ALUM & MAG HYDROXIDE-SIMETH 200-200-20 MG/5ML PO SUSP
15.0000 mL | Freq: Once | ORAL | Status: AC
  Administered 2024-06-22 (×2): 15 mL via ORAL

## 2024-06-22 NOTE — ED Provider Notes (Signed)
 [facility]    CSN: [id number] Arrival date & time: 06/22/24  1537      History   Chief Complaint Chief Complaint  Patient presents with   Foreign Body    HPI Anne [name] is a 83 y.o. female.   DESA RECH is a 83 y.o. female presenting for chief complaint of foreign body sensation to the esophagus of the throat/chest that started abruptly at 1:30 PM after taking her cancer medication.  She takes a medication called Verzenio and recently had the dose increased from 50 mg to 100 mg 2 weeks ago.  Medication has known side effect of acid reflux/esophageal spasm.  She has had acid reflux sensation and foreign body sensation to the throat/chest since taking the medication 3 hours ago.  States rubbing her central chest helps her feel little bit better.  She has been coughing clear phlegm and attempt to relieve foreign body sensation.  Maintaining secretions without difficulty.  Denies current chest pain, shortness of breath, heart palpitations, dizziness, and difficulty swallowing.  No pain with swallowing.  She has been able to drink water and eat solids since onset of symptoms. Tolerating fluids in clinic.  Denies current nausea and vomiting.   Foreign Body   Past Medical History:  Diagnosis Date   ALKALINE PHOSPHATASE, ELEVATED 03/15/2009   Allergic state 06/10/2012   Allergy     Anemia    Anxiety and depression 04/28/2011   Arthritis    Asthma    Atypical chest pain 11/30/2011   AVM (arteriovenous malformation) of colon 2011   cecum   Baker's cyst of knee 05/22/2011   Cancer (HCC) 11/2006   XRT/chemo 01-02/ lobular invasive ca   Carotid artery disease (HCC)    a. Carotid duplex 03/2014: stable 1-39% BICA, f/u due 03/2016.   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     Clotting disorder (HCC)    COPD (chronic obstructive pulmonary disease) (HCC)    Dementia (HCC)    Dermatitis  11/23/2012   Dyspnea    EE (eosinophilic esophagitis)    Emphysema of lung (HCC)    Esophageal ring    ESOPHAGEAL STRICTURE 03/29/2009   Fall 11/23/2012   Family history of breast cancer    Family history of colon cancer    Family history of ovarian cancer    Family history of pancreatic cancer    Folliculitis of nose 12/31/2011   GERD (gastroesophageal reflux disease) 09/29/2009   improved s/p cholecystectomy and esophagus dilatation   History of chicken pox    History of measles    History of shingles    2 episodes   Hx of echocardiogram    a. Echo 01/2013: mild LVH, EF 55-60%, normal wall motion, Gr 1 diast dysfn   Hyperlipidemia    Hypertension    Knee pain, bilateral 07/23/2011   Medicare annual wellness visit, subsequent 06/19/2015   Mixed hyperlipidemia 10/17/2010   Qualifier: Diagnosis of  By: Edsel Mems     Orthostasis    Osteopenia 03/14/2011   Osteoporosis    Personal history of chemotherapy 2001   Personal history of radiation therapy 2001   rt breast   PERSONAL HX BREAST CANCER 09/29/2009   Pneumonia    PVC's (premature ventricular contractions)    a. Event monitor 01/2013: NSR, extensive PVCs.   Radial neck fracture 10/2011   minimally displaced   Substance abuse (HCC)  In remission 3 years.    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   Facial trauma, sequela 08/09/2019   Hyperglycemia 08/19/2018   Mild intermittent asthma without complication 06/17/2018   Asthma  04/30/2018   Macular degeneration, 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   Palpitations 04/30/2018   Lumbar spondylosis 09/23/2017   Hypokalemia 08/11/2017   Abdominal pain 08/11/2017   SBO (small bowel obstruction) (HCC) 08/10/2017   History of right breast cancer 03/21/2017   Genetic testing 09/13/2016   Family history of breast cancer    Family history of pancreatic cancer    Family history of colon cancer    Pain in the chest 05/16/2016   Status post right breast reconstruction 05/11/2016   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) 05/11/2016   History of breast cancer in female 05/11/2016   Osteopenia determined by x-ray 05/11/2016   IBS (irritable bowel syndrome) 03/09/2016   Dyspnea 06/19/2015   Medicare annual wellness visit, subsequent 06/19/2015   Urinary frequency 12/12/2014   Breast cancer, right breast (HCC) 10/18/2014   Superficial thrombophlebitis 03/16/2014   Plant dermatitis 03/16/2014   Chest wall pain 02/07/2014   Primary osteoarthritis of both hips 01/21/2014   Elevated sed rate 08/02/2013   Dermatitis 11/23/2012   Falls 11/23/2012   Preventative health care 10/21/2012   Allergy  06/10/2012   Urinary incontinence 03/19/2012   Anemia 12/21/2011   Baker's cyst of knee 05/22/2011   Eosinophilic esophagitis 05/11/2011   Anxiety and depression 04/28/2011   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   Constipation, chronic 03/15/2011   Mixed hyperlipidemia 10/17/2010   CAROTID ARTERY STENOSIS 01/13/2010   GERD 09/29/2009   PERSONAL HX BREAST CANCER 09/29/2009    Past Surgical History:  Procedure Laterality Date   APPENDECTOMY     AUGMENTATION MAMMAPLASTY Right 05/27/2007   BREAST BIOPSY Right 01/02/2007   wire loc   BREAST BIOPSY  12/26/2006   BREAST LUMPECTOMY Right 2001   BREAST RECONSTRUCTION  2008, 2009, 2010   BREAST REDUCTION WITH MASTOPEXY Left 05/30/2017   Procedure: LEFT BREAST REDUCTION FOR SYMTERY WITH MASTOPEXY;  Surgeon: Lowery Estefana RAMAN, DO;   Location: [facility];  Service: Plastics;  Laterality: Left;   CHOLECYSTECTOMY  2010   COLONOSCOPY  09/05/10   [facility]   DENTAL SURGERY  05/2016   4 dental implants by Dr. Dorthula.   ERCP  2010    CBD stone extraction    ESOPHAGOGASTRODUODENOSCOPY  01/08/2012   Procedure: ESOPHAGOGASTRODUODENOSCOPY (EGD);  Surgeon: Lupita FORBES Commander, MD;  Location: [facility];  Service: Endoscopy;  Laterality: N/A;   ESOPHAGOGASTRODUODENOSCOPY (EGD) WITH ESOPHAGEAL DILATION  2010, 2012   LAPAROSCOPIC APPENDECTOMY N/A 08/04/2016   Procedure: APPENDECTOMY LAPAROSCOPIC;  Surgeon: Elspeth Schultze, MD;  Location: [facility];  Service: General;  Laterality: N/A;   LIPOSUCTION WITH LIPOFILLING Left 11/21/2017   Procedure: LIPOSUCTION FROM ABDOMEN WITH LIPOFILLING TO LEFT BREAST;  Surgeon: Lowery Estefana RAMAN, DO;  Location: [facility];  Service: Plastics;  Laterality: Left;   MASTECTOMY MODIFIED RADICAL Right  05/27/2007   , Mastectomy modified radical (08), breast reconstruction, CA lesions excised lateral abd wall 2010   MASTOPEXY Left 11/21/2017   Procedure: LEFT BREAST REVISION MASTOPEXY FOR SYMMETRY;  Surgeon: Lowery Estefana RAMAN, DO;  Location: [facility];  Service: Plastics;  Laterality: Left;   REDUCTION MAMMAPLASTY Left    SAVORY DILATION  01/08/2012   Procedure: SAVORY DILATION;  Surgeon: Lupita FORBES Commander, MD;  Location: [facility];  Service: Endoscopy;  Laterality: N/A;  need xray    OB History   No obstetric history on file.      Home Medications    Prior to Admission medications   Medication Sig Start Date End Date Taking? Authorizing Provider  acetaminophen  (TYLENOL ) 500 MG tablet Take 500-1,000 mg by mouth every 6 (six) hours as needed for moderate pain.    [provider]  amitriptyline  (ELAVIL ) 25 MG tablet Take 4 tablets (100 mg total) by mouth at bedtime. 06/08/24   Domenica Harlene LABOR, MD  aspirin  81 MG EC tablet Take 81 mg by mouth daily.     [provider]  atorvastatin  (LIPITOR) 20 MG tablet TAKE 1 TABLET BY MOUTH IN THE MORNING AND AT BEDTIME 02/06/24   Okey Vina GAILS, MD  Biotin 5 MG CAPS Take 5 mg by mouth daily.    [provider]  Brimonidine Tartrate (LUMIFY) 0.025 % SOLN Place 1 drop into both eyes daily.    [provider]  Cholecalciferol (VITAMIN D3) 1000 UNITS CAPS Take 2,000 Units by mouth daily.    [provider]  Cyanocobalamin  (VITAMIN B 12 PO) Take 2 tablets by mouth daily.    [provider]  diltiazem  (CARDIZEM ) 30 MG tablet As needed 08/27/23   Johnson, Kathleen R, PA-C  doxycycline  (VIBRA -TABS) 100 MG tablet Take 1 tablet (100 mg total) by mouth 2 (two) times daily. 03/12/24   Domenica Harlene LABOR, MD  FLUoxetine  (PROZAC ) 20 MG capsule Take 3 capsules (60 mg total) by mouth daily. 03/18/24   Domenica Harlene LABOR, MD  Folic Acid  (FOLATE PO) Take 1 tablet by mouth daily.    [provider]  ipratropium (ATROVENT ) 0.06 % nasal spray Place 2 sprays into both nostrils 4 (four) times daily. 03/18/23   Van Knee, MD  LORazepam  (ATIVAN ) 1 MG tablet TAKE 1 TABLET(1 MG) BY MOUTH EVERY 8 HOURS AS NEEDED FOR ANXIETY 11/19/23   Domenica Harlene LABOR, MD  meclizine  (ANTIVERT ) 12.5 MG tablet Take 1 tablet (12.5 mg total) by mouth 3 (three) times daily as needed for dizziness. 03/12/24   Domenica Harlene LABOR, MD  pantoprazole  (PROTONIX ) 40 MG tablet TAKE 1 TABLET(40 MG) BY MOUTH TWICE DAILY BEFORE A MEAL 03/16/24   Commander Lupita FORBES, MD  Pyridoxine HCl (B-6 PO) Take 1 tablet by mouth daily.    [provider]  Thiamine  HCl (B-1 PO) Take 1 tablet by mouth daily.    [provider]    Family History Family History  Problem Relation Age of Onset   Other Mother        tic [place]   Heart disease Father    Lung cancer Father        smoker   Cirrhosis Sister        Primary Biliary   Anxiety disorder Sister    Osteoporosis Sister    Arthritis Sister        Rheumatoid    Osteoporosis Sister    Skin cancer Sister  multiple skin cancers, over 90 excisions.   Stroke Maternal Grandmother    Alcohol abuse Maternal Grandfather    Heart disease Paternal Grandfather    Esophageal cancer Paternal Grandfather    Rectal cancer Paternal Grandfather    Breast cancer Maternal Aunt        dx in her 26s   Breast cancer Maternal Aunt        dx in her 3s   Breast cancer Maternal Aunt        possible breast cancer dx and died in her 13s   Ovarian cancer Maternal Aunt 29   Pancreatic cancer Maternal Aunt        dx in her 46s   Pancreatic cancer Maternal Uncle        dx in his 68s; smoker   Stomach cancer Maternal Uncle    Cancer Maternal Uncle    Breast cancer Paternal Aunt        dx in her 92s   Lung cancer Paternal Aunt    Breast cancer Cousin        paternal first cousin   Breast cancer Cousin        maternal first cousin   Anesthesia problems Neg Hx    Hypotension Neg Hx    Malignant hyperthermia Neg Hx    Pseudochol deficiency Neg Hx     Social History Social History   Tobacco Use   Smoking status: Former    Current packs/day: 0.00    Average packs/day: 1 pack/day for 50.0 years (50.0 ttl pk-yrs)    Types: Cigarettes    Start date: 05/21/1957    Quit date: 05/22/2007    Years since quitting: 17.0   Smokeless tobacco: Never  Vaping Use   Vaping status: Never Used  Substance Use Topics   Alcohol use: Not Currently   Drug use: No     Allergies   Sorbitol; Tomato; Zofran ; Advil  [ibuprofen ]; Cephalexin ; Diphenhydramine  hcl; Morphine  and codeine; Tramadol ; Antihistamines, chlorpheniramine-type; and Oxycodone    Review of Systems Review of Systems Per HPI  Physical Exam Triage Vital Signs ED Triage Vitals  Encounter Vitals Group     BP 06/22/24 1549 138/85     Girls Systolic BP Percentile --      Girls Diastolic BP Percentile --      Boys Systolic BP Percentile --      Boys Diastolic BP Percentile --      Pulse Rate 06/22/24 1549  (!) 102     Resp 06/22/24 1549 18     Temp 06/22/24 1549 98.3 F (36.8 C)     Temp Source 06/22/24 1549 Oral     SpO2 06/22/24 1549 (!) 85 %     Weight --      Height --      Head Circumference --      Peak Flow --      Pain Score 06/22/24 1552 0     Pain Loc --      Pain Education --      Exclude from Growth Chart --    No data found.  Updated Vital Signs BP 138/85   Pulse (!) 102   Temp 98.3 F (36.8 C) (Oral)   Resp 18   SpO2 95%   Visual Acuity Right Eye Distance:   Left Eye Distance:   Bilateral Distance:    Right Eye Near:   Left Eye Near:    Bilateral Near:     Physical Exam Vitals and  nursing note reviewed.  Constitutional:      Appearance: She is not ill-appearing or toxic-appearing.  HENT:     Head: Normocephalic and atraumatic.     Right Ear: Hearing and external ear normal.     Left Ear: Hearing and external ear normal.     Nose: Nose normal.     Mouth/Throat:     Lips: Pink.     Mouth: Mucous membranes are moist. No injury or oral lesions.     Dentition: Normal dentition.     Tongue: No lesions.     Pharynx: Oropharynx is clear. Uvula midline. No pharyngeal swelling, oropharyngeal exudate, posterior oropharyngeal erythema, uvula swelling or postnasal drip.     Tonsils: No tonsillar exudate.     Comments: No visible foreign body to the posterior oropharynx Eyes:     General: Lids are normal. Vision grossly intact. Gaze aligned appropriately.     Extraocular Movements: Extraocular movements intact.     Conjunctiva/sclera: Conjunctivae normal.  Neck:     Trachea: Trachea and phonation normal.  Cardiovascular:     Rate and Rhythm: Normal rate and regular rhythm.     Heart sounds: Normal heart sounds, S1 normal and S2 normal.  Pulmonary:     Effort: Pulmonary effort is normal. No respiratory distress.     Breath sounds: Normal breath sounds and air entry.  Musculoskeletal:     Cervical back: Neck supple.  Lymphadenopathy:     Cervical: No  cervical adenopathy.  Skin:    General: Skin is warm and dry.     Capillary Refill: Capillary refill takes less than 2 seconds.     Findings: No rash.  Neurological:     General: No focal deficit present.     Mental Status: She is alert and oriented to person, place, and time. Mental status is at baseline.     Cranial Nerves: No dysarthria or facial asymmetry.  Psychiatric:        Mood and Affect: Mood normal.        Speech: Speech normal.        Behavior: Behavior normal.        Thought Content: Thought content normal.        Judgment: Judgment normal.      UC Treatments / Results  Labs (all labs ordered are listed, but only abnormal results are displayed) Labs Reviewed - No data to display  EKG   Radiology No results found.  Procedures Procedures (including critical care time)  Medications Ordered in UC Medications  alum & mag hydroxide-simeth (MAALOX/MYLANTA) 200-200-20 MG/5ML suspension 15 mL (15 mLs Oral Given 06/22/24 1603)  lidocaine  (XYLOCAINE ) 2 % viscous mouth solution 15 mL (15 mLs Mouth/Throat Given 06/22/24 1603)    Initial Impression / Assessment and Plan / UC Course  I have reviewed the triage vital signs and the nursing notes.  Pertinent labs & imaging results that were available during my care of the patient were reviewed by me and considered in my medical decision making (see chart for details).   1.  Sensation of foreign body in throat Vitals are hemodynamically stable, no increased work of breathing on exam, swallowing without difficulty, maintaining secretions without drooling.   Foreign body sensation to the throat/chest improved significantly prior to arrival and has further improved after GI cocktail with Maalox and lidocaine .  X-ray of the neck soft tissues and chest are unremarkable for acute radiopaque foreign body by my interpretation, staff will call if radiology reread  shows abnormality indicating need for change in treatment plan.  She  may use Maalox at home as needed for acid reflux associated with medication.  Advised to call her oncologist to make them aware of her symptoms and discuss lowering medication back to 50 mg as she was tolerating this much better prior to increase in dose 2 weeks ago.  Counseled patient on potential for adverse effects with medications prescribed/recommended today, strict ER and return-to-clinic precautions discussed, patient verbalized understanding.    Final Clinical Impressions(s) / UC Diagnoses   Final diagnoses:  Sensation of foreign body in throat   Discharge Instructions   None    ED Prescriptions   None    PDMP not reviewed this encounter.   Enedelia Dorna HERO, [place] 06/22/24 1712

## 2024-06-22 NOTE — ED Triage Notes (Addendum)
 FNP at bedside Pt sts medication stuck in throat, has been spitting things up for past hour. Has had discomfort in throat as well. Able to control oral secretions. Resp even, unlabored.

## 2024-06-24 ENCOUNTER — Encounter: Payer: Self-pay | Admitting: Family Medicine

## 2024-06-24 DIAGNOSIS — C50911 Malignant neoplasm of unspecified site of right female breast: Secondary | ICD-10-CM | POA: Diagnosis not present

## 2024-06-25 NOTE — Progress Notes (Signed)
 Acute Office Visit  Subjective:     Patient ID: Anne [name], female    DOB: 1941-04-26, 83 y.o.   MRN: [id number]  Chief Complaint  Patient presents with   follow up urgent care- swallowing issue    Chest xray     fell in bathtub on 7/27- sore on back of head    HPI Patient is in today for acute visit. Presents with Husband, Sam.   Recently seee by UC for forgeing body sensation. Recently had increase in   oral chemotherapy medication- Verzenio dose increased from 50 mg to 100 mg approx 2 weeks ago. Medication known to cause SE  acid reflux/esophageal spasm per husband.  Patient reports history of esophageal stricture.  Patient also has complaints of increased frequency diarrhea.  She is currently taking Maalox and Protonix  daily for this.  Patient reports a fall in the bathtub on July 27, striking the back of her head. Her husband witnessed  aftermath. Following the fall, she reports experiencing memory difficulties and increased headaches, which have since improved. She notes tenderness over the back of her head on palpation. Patient also reports gait instability and generalized weakness, which she feels contributed to the fall. She previously attended physical therapy at [facility], found it beneficial, but discontinued when her therapist went on leave   Chronic Low back pain-was with Ortho Pt had lumbar injection L4-5 epidural steroid 06/22/2024 for low back pain   Patient denies fever, chills, SOB, CP, palpitations, dyspnea, edema,  vision changes, N/V/D, abdominal pain, urinary symptoms, rash, weight changes, and recent illness or hospitalizations.   ROS See HPI     Objective:    BP 116/62 (BP Location: Left Arm, Patient Position: Sitting, Cuff Size: Normal)   Pulse 92   Temp 98.4 F (36.9 C) (Oral)   Resp 12   Ht 5' 5 (1.651 m)   Wt 133 lb 6.4 oz (60.5 kg)   SpO2 93%   BMI 22.20 kg/m    Physical Exam  General: No acute distress. Awake and conversant.   Eyes: Normal conjunctiva, anicteric. Round symmetric pupils.  ENT: Hearing grossly intact. No nasal discharge.  Neck: Neck is supple. No masses or thyromegaly.  Respiratory: CTAB. Respirations are non-labored. No wheezing.  Skin: Warm. No rashes or ulcers.  Psych: Alert and oriented. Cooperative, Appropriate mood and affect, Normal judgment.  CV: RRR. No murmur. No lower extremity edema.  MSK:  No clubbing or cyanosis. +TWP posterior head, base Neuro:  CN II-XII grossly normal.   No results found for any visits on 06/26/24.      Assessment & Plan:   Problem List Items Addressed This Visit     Esophageal abnormality   Patient reports history of esophageal issues and seeing GI in the past.  Notes increased heartburn and feelings of foreign body sensation in throat.  Reports difficulty swallowing at times.  Referral placed to GI for further evaluation.      Relevant Orders   Ambulatory referral to Gastroenterology   Fall   [employer] contacted for medical equipment-shower chair.  Patient and husband report that they do have bars in their home to help with stability.  Encouraged placement of rubber mat in shower to prevent falls..      Relevant Orders   Ambulatory referral to Physical Therapy   Gait instability   Patient recently had fall in her home.  Patient reports gait instability and generalized weakness.  Referral to PT to improve  gait stability, and strength.      Relevant Orders   Ambulatory referral to Physical Therapy   Intractable episodic tension-type headache   Patient having increased and frequent headaches following fall in her home.  Although they have somewhat improved and decreased in frequency since fall, will order CT to rule out acute abnormalities.      Relevant Orders   CT HEAD WO CONTRAST ( )   Lumbar spondylosis - Primary   ON 06/22/2024- Pt had  L4-5 epidural steroid injection performed. Per MD note- MRI shows L4-5 facet arthritis, believes pain  syndrome could possibly be attributable to that. May proceed with L4-L5 facet injections if this not successful.  As of today's office visit seems that the injection has been helpful.      Portions of this note were dictated using DRAGON voice recognition software. Please disregard any errors in transcription.    No orders of the defined types were placed in this encounter.   No follow-ups on file.  Essie Lagunes L Letitia Sabala, NP

## 2024-06-25 NOTE — Telephone Encounter (Signed)
 Called patient and she scheduled appointment for tomorrow, 06/26/24.

## 2024-06-26 ENCOUNTER — Encounter: Payer: Self-pay | Admitting: Student

## 2024-06-26 ENCOUNTER — Ambulatory Visit: Admitting: Student

## 2024-06-26 ENCOUNTER — Encounter

## 2024-06-26 VITALS — BP 116/62 | HR 92 | Temp 98.4°F | Resp 12 | Ht 65.0 in | Wt 133.4 lb

## 2024-06-26 DIAGNOSIS — K229 Disease of esophagus, unspecified: Secondary | ICD-10-CM | POA: Diagnosis not present

## 2024-06-26 DIAGNOSIS — W19XXXS Unspecified fall, sequela: Secondary | ICD-10-CM

## 2024-06-26 DIAGNOSIS — W19XXXA Unspecified fall, initial encounter: Secondary | ICD-10-CM | POA: Insufficient documentation

## 2024-06-26 DIAGNOSIS — R2681 Unsteadiness on feet: Secondary | ICD-10-CM | POA: Diagnosis not present

## 2024-06-26 DIAGNOSIS — M47816 Spondylosis without myelopathy or radiculopathy, lumbar region: Secondary | ICD-10-CM | POA: Diagnosis not present

## 2024-06-26 DIAGNOSIS — G44211 Episodic tension-type headache, intractable: Secondary | ICD-10-CM | POA: Diagnosis not present

## 2024-06-26 NOTE — Assessment & Plan Note (Signed)
 Patient recently had fall in her home.  Patient reports gait instability and generalized weakness.  Referral to PT to improve gait stability, and strength.

## 2024-06-26 NOTE — Assessment & Plan Note (Signed)
 [employer] contacted for medical equipment-shower chair.  Patient and husband report that they do have bars in their home to help with stability.  Encouraged placement of rubber mat in shower to prevent falls.SABRA

## 2024-06-26 NOTE — Assessment & Plan Note (Signed)
 Patient having increased and frequent headaches following fall in her home.  Although they have somewhat improved and decreased in frequency since fall, will order CT to rule out acute abnormalities.

## 2024-06-26 NOTE — Assessment & Plan Note (Addendum)
 ON 06/22/2024- Pt had  L4-5 epidural steroid injection performed. Per MD note- MRI shows L4-5 facet arthritis, believes pain syndrome could possibly be attributable to that. May proceed with L4-L5 facet injections if this not successful.  As of today's office visit seems that the injection has been helpful.

## 2024-06-26 NOTE — Addendum Note (Signed)
 Addended by: ESTELLE GILLIS D on: 06/26/2024 03:39 PM   Modules accepted: Orders

## 2024-06-26 NOTE — Assessment & Plan Note (Signed)
 Patient reports history of esophageal issues and seeing GI in the past.  Notes increased heartburn and feelings of foreign body sensation in throat.  Reports difficulty swallowing at times.  Referral placed to GI for further evaluation.

## 2024-06-27 ENCOUNTER — Other Ambulatory Visit: Payer: Self-pay | Admitting: Internal Medicine

## 2024-07-01 ENCOUNTER — Ambulatory Visit: Admitting: Obstetrics and Gynecology

## 2024-07-01 ENCOUNTER — Ambulatory Visit: Admitting: Obstetrics

## 2024-07-02 ENCOUNTER — Other Ambulatory Visit: Payer: Self-pay | Admitting: Family Medicine

## 2024-07-02 DIAGNOSIS — S299XXA Unspecified injury of thorax, initial encounter: Secondary | ICD-10-CM

## 2024-07-02 NOTE — Telephone Encounter (Signed)
 Requesting: lorazepam  1mg  Contract: Yes updated at last visit not in chart yet UDS: will get at next visit Last Visit: 06/26/24 Next Visit: 09/17/2024 Last Refill: 11/19/23  Please Advise

## 2024-07-03 DIAGNOSIS — M160 Bilateral primary osteoarthritis of hip: Secondary | ICD-10-CM | POA: Diagnosis not present

## 2024-07-03 DIAGNOSIS — C50911 Malignant neoplasm of unspecified site of right female breast: Secondary | ICD-10-CM | POA: Diagnosis not present

## 2024-07-14 ENCOUNTER — Encounter: Payer: Self-pay | Admitting: Sports Medicine

## 2024-07-14 DIAGNOSIS — Z1732 Human epidermal growth factor receptor 2 negative status: Secondary | ICD-10-CM | POA: Diagnosis not present

## 2024-07-14 DIAGNOSIS — R0602 Shortness of breath: Secondary | ICD-10-CM | POA: Diagnosis not present

## 2024-07-14 DIAGNOSIS — Z87891 Personal history of nicotine dependence: Secondary | ICD-10-CM | POA: Diagnosis not present

## 2024-07-14 DIAGNOSIS — Z79899 Other long term (current) drug therapy: Secondary | ICD-10-CM | POA: Diagnosis not present

## 2024-07-14 DIAGNOSIS — Z9049 Acquired absence of other specified parts of digestive tract: Secondary | ICD-10-CM | POA: Diagnosis not present

## 2024-07-14 DIAGNOSIS — M8580 Other specified disorders of bone density and structure, unspecified site: Secondary | ICD-10-CM | POA: Diagnosis not present

## 2024-07-14 DIAGNOSIS — C792 Secondary malignant neoplasm of skin: Secondary | ICD-10-CM | POA: Diagnosis not present

## 2024-07-14 DIAGNOSIS — C50911 Malignant neoplasm of unspecified site of right female breast: Secondary | ICD-10-CM | POA: Diagnosis not present

## 2024-07-14 DIAGNOSIS — R7989 Other specified abnormal findings of blood chemistry: Secondary | ICD-10-CM | POA: Diagnosis not present

## 2024-07-14 DIAGNOSIS — Z5111 Encounter for antineoplastic chemotherapy: Secondary | ICD-10-CM | POA: Diagnosis not present

## 2024-07-14 DIAGNOSIS — J439 Emphysema, unspecified: Secondary | ICD-10-CM | POA: Diagnosis not present

## 2024-07-14 DIAGNOSIS — Z9181 History of falling: Secondary | ICD-10-CM | POA: Diagnosis not present

## 2024-07-21 ENCOUNTER — Ambulatory Visit
Admission: EM | Admit: 2024-07-21 | Discharge: 2024-07-21 | Disposition: A | Discharge status: Home / Self Care | Attending: Family Medicine | Admitting: Family Medicine

## 2024-07-21 DIAGNOSIS — T7840XA Allergy, unspecified, initial encounter: Secondary | ICD-10-CM | POA: Diagnosis not present

## 2024-07-21 DIAGNOSIS — T63441A Toxic effect of venom of bees, accidental (unintentional), initial encounter: Secondary | ICD-10-CM

## 2024-07-21 MED ORDER — DIPHENHYDRAMINE HCL 50 MG/ML IJ SOLN: 12.5000 mg | Freq: Once | INTRAMUSCULAR | Status: DC

## 2024-07-21 MED ORDER — METHYLPREDNISOLONE SODIUM SUCC 125 MG IJ SOLR
80.0000 mg | Freq: Once | INTRAMUSCULAR | Status: AC
  Administered 2024-07-21: 80 mg via INTRAMUSCULAR

## 2024-07-21 MED ORDER — HYDROCODONE-ACETAMINOPHEN 7.5-325 MG/15ML PO SOLN: 10.0000 mL | ORAL | 0 refills | Status: DC | PRN

## 2024-07-21 MED ORDER — TRIAMCINOLONE ACETONIDE 0.1 % EX OINT: 1.0000 | TOPICAL_OINTMENT | Freq: Two times a day (BID) | CUTANEOUS | 1 refills | Status: DC

## 2024-07-21 MED ORDER — KETOROLAC TROMETHAMINE 30 MG/ML IJ SOLN
15.0000 mg | Freq: Once | INTRAMUSCULAR | Status: AC
  Administered 2024-07-21: 15 mg via INTRAMUSCULAR

## 2024-07-21 MED ORDER — CETIRIZINE HCL 10 MG PO TABS
10.0000 mg | ORAL_TABLET | Freq: Every day | ORAL | Status: DC
  Administered 2024-07-21: 10 mg via ORAL

## 2024-07-21 NOTE — ED Provider Notes (Signed)
 [name]    CSN: [id number] Arrival date & time: 07/21/24  1847      History   Chief Complaint Chief Complaint  Patient presents with   Insect Bite    Bee sting    HPI Anne [name] is a 83 y.o. female.   Patient was stung by an unknown insect on her left hand today.  It happened around 2:00.  As the day is going on her hand has become more swollen and painful.  She has a vague sensation that her chest is tight.  No wheezing.  No difficulty speaking.  No difficulty swallowing.  She has never had a reaction like this before.  She has not taken any medications.  No known insect allergies.  Multiple drug intolerance. Patient is on treatment for metastatic cancer.  States her immunity is low    Past Medical History:  Diagnosis Date   ALKALINE PHOSPHATASE, ELEVATED 03/15/2009   Allergic state 06/10/2012   Allergy     Anemia    Anxiety and depression 04/28/2011   Arthritis    Asthma    Atypical chest pain 11/30/2011   AVM (arteriovenous malformation) of colon 2011   cecum   Baker's cyst of knee 05/22/2011   Cancer (HCC) 11/2006   XRT/chemo 01-02/ lobular invasive ca   Carotid artery disease (HCC)    a. Carotid duplex 03/2014: stable 1-39% BICA, f/u due 03/2016.   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     Clotting disorder (HCC)    COPD (chronic obstructive pulmonary disease) (HCC)    Dementia (HCC)    Dermatitis 11/23/2012   Dyspnea    EE (eosinophilic esophagitis)    Emphysema of lung (HCC)    Esophageal ring    ESOPHAGEAL STRICTURE 03/29/2009   Fall 11/23/2012   Family history of breast cancer    Family history of colon cancer    Family history of ovarian cancer    Family history of pancreatic cancer    Folliculitis of nose 12/31/2011   GERD (gastroesophageal reflux disease) 09/29/2009   improved s/p cholecystectomy and esophagus dilatation   History of chicken pox     History of measles    History of shingles    2 episodes   Hx of echocardiogram    a. Echo 01/2013: mild LVH, EF 55-60%, normal wall motion, Gr 1 diast dysfn   Hyperlipidemia    Hypertension    Knee pain, bilateral 07/23/2011   Medicare annual wellness visit, subsequent 06/19/2015   Mixed hyperlipidemia 10/17/2010   Qualifier: Diagnosis of  By: Edsel Mems     Orthostasis    Osteopenia 03/14/2011   Osteoporosis    Personal history of chemotherapy 2001   Personal history of radiation therapy 2001   rt breast   PERSONAL HX BREAST CANCER 09/29/2009   Pneumonia    PVC's (premature ventricular contractions)    a. Event monitor 01/2013: NSR, extensive PVCs.   Radial neck fracture 10/2011   minimally displaced   Substance abuse (HCC)    In remission 3 years.    Urinary incontinence 03/19/2012   Vaginitis 05/22/2011    Patient Active Problem List   Diagnosis Date Noted   Fall 06/26/2024   Gait instability 06/26/2024   Esophageal abnormality 06/26/2024   Intractable episodic tension-type headache 06/26/2024   Fracture of right acetabulum [facility]) 02/04/2023   Neuropathy 11/23/2022   Fibromyalgia 10/16/2022   Primary  osteoarthritis of right knee 09/04/2022   Breast mass, left 05/03/2022   Severe depression (HCC) 03/08/2022   Memory changes 02/03/2022   Insomnia 12/28/2020   Vertigo 12/28/2020   Emphysema lung (HCC) 06/06/2020   RLS (restless legs syndrome) 02/24/2020   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   Facial trauma, sequela 08/09/2019   Hyperglycemia 08/19/2018   Mild intermittent asthma without complication 06/17/2018   Asthma 04/30/2018   Macular degeneration, 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   Palpitations 04/30/2018   Lumbar spondylosis 09/23/2017   Hypokalemia 08/11/2017   Abdominal pain 08/11/2017   SBO (small bowel obstruction) (HCC) 08/10/2017   History of right breast cancer 03/21/2017   Genetic testing 09/13/2016   Family history of breast cancer    Family  history of pancreatic cancer    Family history of colon cancer    Pain in the chest 05/16/2016   Status post right breast reconstruction 05/11/2016   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) 05/11/2016   History of breast cancer in female 05/11/2016   Osteopenia determined by x-ray 05/11/2016   IBS (irritable bowel syndrome) 03/09/2016   Dyspnea 06/19/2015   Medicare annual wellness visit, subsequent 06/19/2015   Urinary frequency 12/12/2014   Breast cancer, right breast (HCC) 10/18/2014   Superficial thrombophlebitis 03/16/2014   Plant dermatitis 03/16/2014   Chest wall pain 02/07/2014   Primary osteoarthritis of both hips 01/21/2014   Elevated sed rate 08/02/2013   Dermatitis 11/23/2012   Falls 11/23/2012   Preventative health care 10/21/2012   Allergy  06/10/2012   Urinary incontinence 03/19/2012   Anemia 12/21/2011   Baker's cyst of knee 05/22/2011   Eosinophilic esophagitis 05/11/2011   Anxiety and depression 04/28/2011   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   Constipation, chronic 03/15/2011   Mixed hyperlipidemia 10/17/2010   CAROTID ARTERY STENOSIS 01/13/2010   GERD 09/29/2009   PERSONAL HX BREAST CANCER 09/29/2009    Past Surgical History:  Procedure Laterality Date   APPENDECTOMY     AUGMENTATION MAMMAPLASTY Right 05/27/2007   BREAST BIOPSY Right 01/02/2007   wire loc   BREAST BIOPSY  12/26/2006   BREAST LUMPECTOMY Right 2001   BREAST RECONSTRUCTION  2008, 2009, 2010   BREAST REDUCTION WITH MASTOPEXY Left 05/30/2017   Procedure: LEFT BREAST REDUCTION FOR SYMTERY WITH MASTOPEXY;  Surgeon: Lowery Estefana RAMAN, DO;  Location: [facility];  Service: Plastics;  Laterality: Left;   CHOLECYSTECTOMY  2010   COLONOSCOPY  09/05/10   [facility]   DENTAL SURGERY  05/2016   4 dental implants by Dr. Dorthula.   ERCP  2010    CBD stone extraction    ESOPHAGOGASTRODUODENOSCOPY  01/08/2012   Procedure: ESOPHAGOGASTRODUODENOSCOPY (EGD);  Surgeon:  Lupita FORBES Commander, MD;  Location: [facility];  Service: Endoscopy;  Laterality: N/A;   ESOPHAGOGASTRODUODENOSCOPY (EGD) WITH ESOPHAGEAL DILATION  2010, 2012   LAPAROSCOPIC APPENDECTOMY N/A 08/04/2016   Procedure: APPENDECTOMY LAPAROSCOPIC;  Surgeon: Elspeth Schultze, MD;  Location: [facility];  Service: General;  Laterality: N/A;   LIPOSUCTION WITH LIPOFILLING Left 11/21/2017   Procedure: LIPOSUCTION FROM ABDOMEN WITH LIPOFILLING TO LEFT BREAST;  Surgeon: Lowery Estefana RAMAN, DO;  Location: [facility];  Service: Plastics;  Laterality: Left;   MASTECTOMY MODIFIED RADICAL Right 05/27/2007   , Mastectomy modified radical (08), breast reconstruction, CA lesions excised lateral abd wall 2010   MASTOPEXY Left 11/21/2017   Procedure: LEFT BREAST REVISION MASTOPEXY FOR SYMMETRY;  Surgeon: Lowery Estefana RAMAN, DO;  Location: [facility];  Service:  Plastics;  Laterality: Left;   REDUCTION MAMMAPLASTY Left    SAVORY DILATION  01/08/2012   Procedure: SAVORY DILATION;  Surgeon: Lupita FORBES Commander, MD;  Location: [facility];  Service: Endoscopy;  Laterality: N/A;  need xray    OB History   No obstetric history on file.      Home Medications    Prior to Admission medications   Medication Sig Start Date End Date Taking? Authorizing Provider  HYDROcodone -acetaminophen  (HYCET) 7.5-325 mg/15 ml solution Take 10 mLs by mouth every 4 (four) hours as needed for moderate pain (pain score 4-6). 07/21/24 07/21/25 Yes Maranda Jamee Jacob, MD  triamcinolone  ointment (KENALOG ) 0.1 % Apply 1 Application topically 2 (two) times daily. 07/21/24  Yes Maranda Jamee Jacob, MD  abemaciclib (VERZENIO) 100 MG tablet Take 100 mg by mouth. Take 1 tablet (100 mg total) by mouth 2 (two) times daily for 28 days **Avoid grapefruit and grapefruit juice products.** **Swallow Whole (do not crush, chew, or split).** **Caution: Hazardous Drug. Special handling and disposal in blue bin required.**    Instructions: Take 1 tablet  (100 mg total) by mouth 2 (two) times daily for 28 days **Avoid grapefruit and grapefruit juice products.** **Swallow Whole (do not crush, chew, or split).** **Caution: Hazardous Drug. Special handling and disposal in blue bin required.** 06/25/24 07/23/24  [provider]  acetaminophen  (TYLENOL ) 500 MG tablet Take 500-1,000 mg by mouth every 6 (six) hours as needed for moderate pain.    [provider]  amitriptyline  (ELAVIL ) 25 MG tablet Take 4 tablets (100 mg total) by mouth at bedtime. 06/08/24   Domenica Harlene LABOR, MD  aspirin  81 MG EC tablet Take 81 mg by mouth daily.    [provider]  Biotin 5 MG CAPS Take 5 mg by mouth daily.    [provider]  Brimonidine Tartrate (LUMIFY) 0.025 % SOLN Place 1 drop into both eyes daily.    [provider]  Cholecalciferol (VITAMIN D3) 1000 UNITS CAPS Take 2,000 Units by mouth daily.    [provider]  Cyanocobalamin  (VITAMIN B 12 PO) Take 2 tablets by mouth daily.    [provider]  diltiazem  (CARDIZEM ) 30 MG tablet As needed 08/27/23   Vicci Rollo SAUNDERS, PA-C  FLUoxetine  (PROZAC ) 20 MG capsule Take 3 capsules (60 mg total) by mouth daily. 03/18/24   Domenica Harlene LABOR, MD  Folic Acid  (FOLATE PO) Take 1 tablet by mouth daily.    [provider]  ipratropium (ATROVENT ) 0.06 % nasal spray Place 2 sprays into both nostrils 4 (four) times daily. 03/18/23   Van Knee, MD  LORazepam  (ATIVAN ) 1 MG tablet TAKE 1 TABLET(1 MG) BY MOUTH EVERY 8 HOURS AS NEEDED FOR ANXIETY 07/03/24   Domenica Harlene LABOR, MD  pantoprazole  (PROTONIX ) 40 MG tablet TAKE 1 TABLET(40 MG) BY MOUTH TWICE DAILY BEFORE A MEAL 06/29/24   Commander Lupita FORBES, MD  Pyridoxine HCl (B-6 PO) Take 1 tablet by mouth daily.    [provider]  Thiamine  HCl (B-1 PO) Take 1 tablet by mouth daily.    [provider]    Family History Family History  Problem Relation Age of Onset   Other Mother        tic [place]    Heart disease Father    Lung cancer Father        smoker   Cirrhosis Sister        Primary Biliary   Anxiety disorder Sister  Osteoporosis Sister    Arthritis Sister        Rheumatoid   Osteoporosis Sister    Skin cancer Sister        multiple skin cancers, over 90 excisions.   Stroke Maternal Grandmother    Alcohol abuse Maternal Grandfather    Heart disease Paternal Grandfather    Esophageal cancer Paternal Grandfather    Rectal cancer Paternal Grandfather    Breast cancer Maternal Aunt        dx in her 75s   Breast cancer Maternal Aunt        dx in her 29s   Breast cancer Maternal Aunt        possible breast cancer dx and died in her 77s   Ovarian cancer Maternal Aunt 29   Pancreatic cancer Maternal Aunt        dx in her 39s   Pancreatic cancer Maternal Uncle        dx in his 73s; smoker   Stomach cancer Maternal Uncle    Cancer Maternal Uncle    Breast cancer Paternal Aunt        dx in her 68s   Lung cancer Paternal Aunt    Breast cancer Cousin        paternal first cousin   Breast cancer Cousin        maternal first cousin   Anesthesia problems Neg Hx    Hypotension Neg Hx    Malignant hyperthermia Neg Hx    Pseudochol deficiency Neg Hx     Social History Social History   Tobacco Use   Smoking status: Former    Current packs/day: 0.00    Average packs/day: 1 pack/day for 50.0 years (50.0 ttl pk-yrs)    Types: Cigarettes    Start date: 05/21/1957    Quit date: 05/22/2007    Years since quitting: 17.1   Smokeless tobacco: Never  Vaping Use   Vaping status: Never Used  Substance Use Topics   Alcohol use: Not Currently   Drug use: No     Allergies   Sorbitol; Tomato; Zofran ; Advil  [ibuprofen ]; Cephalexin ; Diphenhydramine  hcl; Morphine  and codeine; Tramadol ; Antihistamines, chlorpheniramine-type; and Oxycodone    Review of Systems Review of Systems See HPI  Physical Exam Triage Vital Signs ED Triage Vitals [07/21/24 1919]  Encounter Vitals  Group     BP 97/61     Girls Systolic BP Percentile      Girls Diastolic BP Percentile      Boys Systolic BP Percentile      Boys Diastolic BP Percentile      Pulse Rate 78     Resp 17     Temp 98.1 F (36.7 C)     Temp Source Oral     SpO2 95 %     Weight      Height      Head Circumference      Peak Flow      Pain Score 10     Pain Loc      Pain Education      Exclude from Growth Chart    No data found.  Updated Vital Signs BP 123/89   Pulse 78   Temp 98.1 F (36.7 C) (Oral)   Resp 17   SpO2 95%    Physical Exam Constitutional:      General: She is not in acute distress.    Appearance: She is well-developed and normal weight. She is ill-appearing.  Comments: Frail elderly woman.  In moderate distress due to pain.  Appears chronically ill  HENT:     Head: Normocephalic and atraumatic.     Mouth/Throat:     Mouth: Mucous membranes are moist.     Comments: Oropharynx benign.  No swelling of uvula Eyes:     Conjunctiva/sclera: Conjunctivae normal.     Pupils: Pupils are equal, round, and reactive to light.  Cardiovascular:     Rate and Rhythm: Normal rate and regular rhythm.     Heart sounds: Normal heart sounds.  Pulmonary:     Effort: Pulmonary effort is normal. No respiratory distress.     Breath sounds: Normal breath sounds.     Comments: Heart and lung auscultation multiple times throughout patient's visit, always normal.  Clear lung sounds. Musculoskeletal:        General: Normal range of motion.     Cervical back: Normal range of motion.  Skin:    General: Skin is warm and dry.     Findings: Lesion and rash present.     Comments: Soft tissue swelling of the left hand to the forearm.  Very tender to touch.  Faint erythema.  Feet warm  Neurological:     Mental Status: She is alert.      UC Treatments / Results  Labs (all labs ordered are listed, but only abnormal results are displayed) Labs Reviewed - No data to  display  EKG   Radiology No results found.  Procedures Procedures (including critical care time)  Medications Ordered in UC Medications  cetirizine  (ZYRTEC ) tablet 10 mg (10 mg Oral Given 07/21/24 1950)  methylPREDNISolone  sodium succinate (SOLU-MEDROL ) 125 mg/2 mL injection 80 mg (80 mg Intramuscular Given 07/21/24 1937)  ketorolac  (TORADOL ) 30 MG/ML injection 15 mg (15 mg Intramuscular Given 07/21/24 2009)    Initial Impression / Assessment and Plan / UC Course  I have reviewed the triage vital signs and the nursing notes.  Pertinent labs & imaging results that were available during my care of the patient were reviewed by me and considered in my medical decision making (see chart for details).     Patient is having a local allergic reaction to a bee sting.  At first she stated that she felt slightly short of breath.  I offered her a shot of adrenaline and a ride to the emergency room.  She then states she did want to go to the ER.  I gave her a shot of Medrol  and a cetirizine .  She started to feel somewhat better.  We kept ice on her arm the entire visit.  At the time of discharge she was given a shot of Toradol .  At no time did she have any abnormality in her heart and lung exam and her vital signs remained stable.  She was deemed appropriate for discharge home with her husband Final Clinical Impressions(s) / UC Diagnoses   Final diagnoses:  Bee sting reaction, accidental or unintentional, initial encounter  Allergic reaction, initial encounter     Discharge Instructions      Continue cold compresses Use the triamcinolone  ointment for itch and pain May take liquid Tylenol  for pain If pain is severe I have prescribed a liquid Tylenol  with hydrocodone .  Make sure you do not take more than 3000 mg of Tylenol  a day. Call your doctor if not improving by tomorrow Call 911 and go to the emergency room if you have difficulty breathing   ED Prescriptions  Medication Sig Dispense  Auth. Provider   triamcinolone  ointment (KENALOG ) 0.1 % Apply 1 Application topically 2 (two) times daily. 15 g Maranda Jamee Jacob, MD   HYDROcodone -acetaminophen  (HYCET) 7.5-325 mg/15 ml solution Take 10 mLs by mouth every 4 (four) hours as needed for moderate pain (pain score 4-6). 118 mL Maranda Jamee Jacob, MD      I have reviewed the PDMP during this encounter.   Maranda Jamee Jacob, MD 07/21/24 2017

## 2024-07-21 NOTE — ED Triage Notes (Signed)
 Pt c/o bee sting to LT hand since 2pm this afternoon. Severe swelling and pain. Aspercreme prn.

## 2024-07-21 NOTE — Discharge Instructions (Addendum)
 Continue cold compresses Use the triamcinolone  ointment for itch and pain May take liquid Tylenol  for pain If pain is severe I have prescribed a liquid Tylenol  with hydrocodone .  Make sure you do not take more than 3000 mg of Tylenol  a day. Call your doctor if not improving by tomorrow Call 911 and go to the emergency room if you have difficulty breathing

## 2024-07-23 ENCOUNTER — Other Ambulatory Visit: Payer: Self-pay

## 2024-07-23 ENCOUNTER — Emergency Department (HOSPITAL_BASED_OUTPATIENT_CLINIC_OR_DEPARTMENT_OTHER)
Admission: EM | Admit: 2024-07-23 | Discharge: 2024-07-23 | Disposition: A | Discharge status: Home / Self Care | Attending: Emergency Medicine | Admitting: Emergency Medicine

## 2024-07-23 ENCOUNTER — Encounter (HOSPITAL_BASED_OUTPATIENT_CLINIC_OR_DEPARTMENT_OTHER): Payer: Self-pay

## 2024-07-23 DIAGNOSIS — E876 Hypokalemia: Secondary | ICD-10-CM | POA: Diagnosis not present

## 2024-07-23 DIAGNOSIS — R197 Diarrhea, unspecified: Secondary | ICD-10-CM | POA: Diagnosis not present

## 2024-07-23 DIAGNOSIS — Z7982 Long term (current) use of aspirin: Secondary | ICD-10-CM | POA: Insufficient documentation

## 2024-07-23 LAB — CBC WITH DIFFERENTIAL/PLATELET
Abs Immature Granulocytes: 0.01 K/uL (ref 0.00–0.07)
Basophils Absolute: 0.1 K/uL (ref 0.0–0.1)
Basophils Relative: 1 %
Eosinophils Absolute: 0.1 K/uL (ref 0.0–0.5)
Eosinophils Relative: 1 %
HCT: 30.8 % — ABNORMAL LOW (ref 36.0–46.0)
Hemoglobin: 10.3 g/dL — ABNORMAL LOW (ref 12.0–15.0)
Immature Granulocytes: 0 %
Lymphocytes Relative: 25 %
Lymphs Abs: 1.9 K/uL (ref 0.7–4.0)
MCH: 32.8 pg (ref 26.0–34.0)
MCHC: 33.4 g/dL (ref 30.0–36.0)
MCV: 98.1 fL (ref 80.0–100.0)
Monocytes Absolute: 0.5 K/uL (ref 0.1–1.0)
Monocytes Relative: 7 %
Neutro Abs: 5 K/uL (ref 1.7–7.7)
Neutrophils Relative %: 66 %
Platelets: 260 K/uL (ref 150–400)
RBC: 3.14 MIL/uL — ABNORMAL LOW (ref 3.87–5.11)
RDW: 15.5 % (ref 11.5–15.5)
WBC: 7.5 K/uL (ref 4.0–10.5)
nRBC: 0 % (ref 0.0–0.2)

## 2024-07-23 LAB — COMPREHENSIVE METABOLIC PANEL WITH GFR
ALT: 19 U/L (ref 0–44)
AST: 37 U/L (ref 15–41)
Albumin: 4.1 g/dL (ref 3.5–5.0)
Alkaline Phosphatase: 75 U/L (ref 38–126)
Anion gap: 14 (ref 5–15)
BUN: 23 mg/dL (ref 8–23)
CO2: 22 mmol/L (ref 22–32)
Calcium: 9.2 mg/dL (ref 8.9–10.3)
Chloride: 104 mmol/L (ref 98–111)
Creatinine, Ser: 0.88 mg/dL (ref 0.44–1.00)
GFR, Estimated: >60 mL/min (ref >60)
Glucose, Bld: 107 mg/dL — ABNORMAL HIGH (ref 70–99)
Potassium: 3.4 mmol/L — ABNORMAL LOW (ref 3.5–5.1)
Sodium: 139 mmol/L (ref 135–145)
Total Bilirubin: 0.3 mg/dL (ref 0.0–1.2)
Total Protein: 7.1 g/dL (ref 6.5–8.1)

## 2024-07-23 LAB — LIPASE, BLOOD: Lipase: 26 U/L (ref 11–51)

## 2024-07-23 MED ORDER — DIPHENOXYLATE-ATROPINE 2.5-0.025 MG PO TABS
1.0000 | ORAL_TABLET | Freq: Four times a day (QID) | ORAL | 0 refills | Status: DC | PRN
Start: 2024-07-23 — End: 2024-09-04

## 2024-07-23 MED ORDER — LACTATED RINGERS IV BOLUS
1000.0000 mL | Freq: Once | INTRAVENOUS | Status: AC
  Administered 2024-07-23: 1000 mL via INTRAVENOUS

## 2024-07-23 NOTE — ED Triage Notes (Signed)
 Pt states she was stung yesterday and was seen at UC was given zyrtec , toradol  and solumedrol IM She also takes Verzenio and spouse feels she had a reaction that has caused diarrhea. Has not been able to keep anything down. 5 episodes of explosive diarrhea

## 2024-07-23 NOTE — ED Provider Notes (Signed)
 [place] EM[facility]Y DE[facility]MENT AT [facility]  Provider Note  CSN: [id number] Arrival date & time: 07/23/24 9957  History Chief Complaint  Patient presents with   Diarrhea    Anne [name] is a 83 y.o. female brought to the ED by husband for evaluation of diarrhea. She was seen in UC yesterday for bee sting on L hand and given steroids which husband believes may have interacted with her Verzenio (breast cancer medication) to cause diarrhea. She has had multiple episodes of watery diarrhea today, no vomiting or abdominal pain. She has had decreased oral intake because it seems to exacerbate the diarrhea. No fevers. No recent Abx.    Home Medications Prior to Admission medications   Medication Sig Start Date End Date Taking? Authorizing Provider  diphenoxylate -atropine  (LOMOTIL ) 2.5-0.025 MG tablet Take 1 tablet by mouth 4 (four) times daily as needed for diarrhea or loose stools. 07/23/24  Yes Roselyn Carlin NOVAK, MD  abemaciclib (VERZENIO) 100 MG tablet Take 100 mg by mouth. Take 1 tablet (100 mg total) by mouth 2 (two) times daily for 28 days **Avoid grapefruit and grapefruit juice products.** **Swallow Whole (do not crush, chew, or split).** **Caution: Hazardous Drug. Special handling and disposal in blue bin required.**    Instructions: Take 1 tablet (100 mg total) by mouth 2 (two) times daily for 28 days **Avoid grapefruit and grapefruit juice products.** **Swallow Whole (do not crush, chew, or split).** **Caution: Hazardous Drug. Special handling and disposal in blue bin required.** 06/25/24 07/23/24  [provider]  acetaminophen  (TYLENOL ) 500 MG tablet Take 500-1,000 mg by mouth every 6 (six) hours as needed for moderate pain.    [provider]  amitriptyline  (ELAVIL ) 25 MG tablet Take 4 tablets (100 mg total) by mouth at bedtime. 06/08/24   Domenica Harlene LABOR, MD  aspirin  81 MG EC tablet Take 81 mg by mouth daily.    [provider]  Biotin 5 MG  CAPS Take 5 mg by mouth daily.    [provider]  Brimonidine Tartrate (LUMIFY) 0.025 % SOLN Place 1 drop into both eyes daily.    [provider]  Cholecalciferol (VITAMIN D3) 1000 UNITS CAPS Take 2,000 Units by mouth daily.    [provider]  Cyanocobalamin  (VITAMIN B 12 PO) Take 2 tablets by mouth daily.    [provider]  diltiazem  (CARDIZEM ) 30 MG tablet As needed 08/27/23   Vicci Rollo JONELLE, PA-C  FLUoxetine  (PROZAC ) 20 MG capsule Take 3 capsules (60 mg total) by mouth daily. 03/18/24   Domenica Harlene LABOR, MD  Folic Acid  (FOLATE PO) Take 1 tablet by mouth daily.    [provider]  HYDROcodone -acetaminophen  (HYCET) 7.5-325 mg/15 ml solution Take 10 mLs by mouth every 4 (four) hours as needed for moderate pain (pain score 4-6). 07/21/24 07/21/25  Maranda Jamee Jacob, MD  ipratropium (ATROVENT ) 0.06 % nasal spray Place 2 sprays into both nostrils 4 (four) times daily. 03/18/23   Van Knee, MD  LORazepam  (ATIVAN ) 1 MG tablet TAKE 1 TABLET(1 MG) BY MOUTH EVERY 8 HOURS AS NEEDED FOR ANXIETY 07/03/24   Domenica Harlene LABOR, MD  pantoprazole  (PROTONIX ) 40 MG tablet TAKE 1 TABLET(40 MG) BY MOUTH TWICE DAILY BEFORE A MEAL 06/29/24   Avram Lupita BRAVO, MD  Pyridoxine HCl (B-6 PO) Take 1 tablet by mouth daily.    [provider]  Thiamine  HCl (B-1 PO) Take 1 tablet by mouth daily.    [provider]  triamcinolone  ointment (KENALOG ) 0.1 % Apply 1 Application topically 2 (two) times daily. 07/21/24   Maranda Jamee Jacob, MD     Allergies    Sorbitol; Tomato; Zofran ; Advil  [ibuprofen ]; Cephalexin ; Diphenhydramine  hcl; Morphine  and codeine; Tramadol ; Antihistamines, chlorpheniramine-type; and Oxycodone    Review of Systems   Review of Systems Please see HPI for pertinent positives and negatives  Physical Exam BP (!) 115/91   Pulse 88   Temp 97.7 F (36.5 C)   Resp 15   Ht 5' 5.5 (1.664 m)   Wt 60.3 kg   SpO2 90%   BMI 21.80 kg/m    Physical Exam Vitals and nursing note reviewed.  Constitutional:      Appearance: Normal appearance.  HENT:     Head: Normocephalic and atraumatic.     Nose: Nose normal.     Mouth/Throat:     Mouth: Mucous membranes are moist.  Eyes:     Extraocular Movements: Extraocular movements intact.     Conjunctiva/sclera: Conjunctivae normal.  Cardiovascular:     Rate and Rhythm: Normal rate.  Pulmonary:     Effort: Pulmonary effort is normal.     Breath sounds: Normal breath sounds.  Abdominal:     General: Abdomen is flat.     Palpations: Abdomen is soft.     Tenderness: There is no abdominal tenderness.  Musculoskeletal:        General: Swelling (mild, soft tissue, left hand) present. Normal range of motion.     Cervical back: Neck supple.  Skin:    General: Skin is warm and dry.  Neurological:     General: No focal deficit present.     Mental Status: She is alert.  Psychiatric:        Mood and Affect: Mood normal.     ED Results / Procedures / Treatments   EKG None  Procedures Procedures  Medications Ordered in the ED Medications  lactated ringers  bolus 1,000 mL (0 mLs Intravenous Stopped 07/23/24 0444)    Initial Impression and Plan  Patient here for persistent diarrhea during the day today. Exam is benign, vitals without fever, but had low SpO2 in triage. Not having any SOB. Will recheck in room. Check labs, give IVF and reassess.   ED Course   Clinical Course as of 07/23/24 0522  Thu Jul 23, 2024  0221 CBC with mild anemia, otherwise unremarkable.  [CS]  0237 CMP with mildly decreased K, otherwise unremarkable. Lipase is normal.  [CS]  0456 Patient resting comfortably. Husband thinks she may have had a diarrheal stool in her adult diaper, will check for consistency for possible pathogen panel. She was also reportedly hypoxic on RA while asleep, started on supplemental oxygen. She has not had any recent respiratory symptoms and no chronic lung problems. Will  recheck on room air while awake.  [CS]  0503 Per RN, no stool in adult diaper. Will check an ambulatory pulse ox.  [CS]  0519 Patient's SpO2 is 97% when walking, no exertional hypoxia and not symptomatic. She went to the restroom to urinate and did not have any diarrhea. She has been taking imodium  at home without improvement, will give a short course of Lomotil , recommend outpatient PCP follow up. RTED for any other concerns.  [CS]    Clinical Course User Index [CS] Roselyn Carlin NOVAK, MD     MDM Rules/Calculators/A&P Medical Decision Making Problems Addressed: Diarrhea, unspecified type: acute illness or injury  Amount and/or Complexity of Data Reviewed Labs: ordered. Decision-making  details documented in ED Course.  Risk Prescription drug management.     Final Clinical Impression(s) / ED Diagnoses Final diagnoses:  Diarrhea, unspecified type    Rx / DC Orders ED Discharge Orders          Ordered    diphenoxylate -atropine  (LOMOTIL ) 2.5-0.025 MG tablet  4 times daily PRN        07/23/24 0520             Roselyn Carlin NOVAK, MD 07/23/24 [phone]

## 2024-07-24 DIAGNOSIS — C50911 Malignant neoplasm of unspecified site of right female breast: Secondary | ICD-10-CM | POA: Diagnosis not present

## 2024-07-28 DIAGNOSIS — Z008 Encounter for other general examination: Secondary | ICD-10-CM | POA: Diagnosis not present

## 2024-07-30 ENCOUNTER — Ambulatory Visit (INDEPENDENT_AMBULATORY_CARE_PROVIDER_SITE_OTHER)

## 2024-07-30 VITALS — Ht 65.0 in | Wt 133.0 lb

## 2024-07-30 DIAGNOSIS — Z0000 Encounter for general adult medical examination without abnormal findings: Secondary | ICD-10-CM | POA: Diagnosis not present

## 2024-07-30 NOTE — Patient Instructions (Addendum)
 Anne [name],  Thank you for taking the time for your Medicare Wellness Visit. I appreciate your continued commitment to your health goals. Please review the care plan we discussed, and feel free to reach out if I can assist you further.  Medicare recommends these wellness visits once per year to help you and your care team stay ahead of potential health issues. These visits are designed to focus on prevention, allowing your provider to concentrate on managing your acute and chronic conditions during your regular appointments.  Please note that Annual Wellness Visits do not include a physical exam. Some assessments may be limited, especially if the visit was conducted virtually. If needed, we may recommend a separate in-person follow-up with your provider.  Ongoing Care Seeing your primary care provider every 3 to 6 months helps us  monitor your health and provide consistent, personalized care.   Referrals If a referral was made during today's visit and you haven't received any updates within two weeks, please contact the referred provider directly to check on the status.  Recommended Screenings:  Health Maintenance  Topic Date Due   Pneumococcal Vaccine for age over 72 (1 of 2 - PCV) Never done   Flu Shot  06/12/2024   COVID-19 Vaccine (7 - Pfizer risk 2024-25 season) 07/13/2024   Zoster (Shingles) Vaccine (1 of 2) 06/26/2025*   Medicare Annual Wellness Visit  07/30/2025   DTaP/Tdap/Td vaccine (3 - Td or Tdap) 11/09/2032   DEXA scan (bone density measurement)  Completed   HPV Vaccine  Aged Out   Meningitis B Vaccine  Aged Out   Colon Cancer Screening  Discontinued  *Topic was postponed. The date shown is not the original due date.       07/30/2024    2:09 PM  Advanced Directives  Does Patient Have a Medical Advance Directive? No  Would patient like information on creating a medical advance directive? No - Patient declined   Advance Care Planning is important because it: Ensures you  receive medical care that aligns with your values, goals, and preferences. Provides guidance to your family and loved ones, reducing the emotional burden of decision-making during critical moments.  Vision: Annual vision screenings are recommended for early detection of glaucoma, cataracts, and diabetic retinopathy. These exams can also reveal signs of chronic conditions such as diabetes and high blood pressure.  Dental: Annual dental screenings help detect early signs of oral cancer, gum disease, and other conditions linked to overall health, including heart disease and diabetes.  Please see the attached documents for additional preventive care recommendations.

## 2024-07-30 NOTE — Progress Notes (Signed)
 Subjective:   Anne [name] is a 83 y.o. who presents for a Medicare Wellness preventive visit.  As a reminder, Annual Wellness Visits don't include a physical exam, and some assessments may be limited, especially if this visit is performed virtually. We may recommend an in-person follow-up visit with your provider if needed.  Visit Complete: Virtual I connected with  Anne [name] on 07/30/24 by a audio enabled telemedicine application and verified that I am speaking with the correct person using two identifiers.  Patient Location: Home  Provider Location: Home Office  I discussed the limitations of evaluation and management by telemedicine. The patient expressed understanding and agreed to proceed.  Vital Signs: Because this visit was a virtual/telehealth visit, some criteria may be missing or patient reported. Any vitals not documented were not able to be obtained and vitals that have been documented are patient reported.  Persons Participating in Visit: Patient.  AWV Questionnaire: No: Patient Medicare AWV questionnaire was not completed prior to this visit.  Cardiac Risk Factors include: advanced age (>98men, >31 women)     Objective:    Today's Vitals   07/30/24 1359  Weight: 133 lb (60.3 kg)  Height: 5' 5 (1.651 m)  PainSc: 0-No pain   Body mass index is 22.13 kg/m.     07/30/2024    2:09 PM 03/22/2023    2:18 PM 11/13/2022    1:19 PM 11/08/2022    6:52 PM 02/27/2022    2:52 PM 10/22/2021   10:28 PM 04/13/2021   12:43 AM  Advanced Directives  Does Patient Have a Medical Advance Directive? No No No No No No No  Would patient like information on creating a medical advance directive? No - Patient declined No - Patient declined  No - Patient declined No - Patient declined No - Patient declined     Current Medications (verified) Outpatient Encounter Medications as of 07/30/2024  Medication Sig   acetaminophen  (TYLENOL ) 500 MG tablet Take 500-1,000 mg by mouth  every 6 (six) hours as needed for moderate pain.   amitriptyline  (ELAVIL ) 25 MG tablet Take 4 tablets (100 mg total) by mouth at bedtime.   aspirin  81 MG EC tablet Take 81 mg by mouth daily.   Biotin 5 MG CAPS Take 5 mg by mouth daily.   Brimonidine Tartrate (LUMIFY) 0.025 % SOLN Place 1 drop into both eyes daily.   Cholecalciferol (VITAMIN D3) 1000 UNITS CAPS Take 2,000 Units by mouth daily.   Cyanocobalamin  (VITAMIN B 12 PO) Take 2 tablets by mouth daily.   diltiazem  (CARDIZEM ) 30 MG tablet As needed   diphenoxylate -atropine  (LOMOTIL ) 2.5-0.025 MG tablet Take 1 tablet by mouth 4 (four) times daily as needed for diarrhea or loose stools.   FLUoxetine  (PROZAC ) 20 MG capsule Take 3 capsules (60 mg total) by mouth daily.   Folic Acid  (FOLATE PO) Take 1 tablet by mouth daily.   HYDROcodone -acetaminophen  (HYCET) 7.5-325 mg/15 ml solution Take 10 mLs by mouth every 4 (four) hours as needed for moderate pain (pain score 4-6).   ipratropium (ATROVENT ) 0.06 % nasal spray Place 2 sprays into both nostrils 4 (four) times daily.   LORazepam  (ATIVAN ) 1 MG tablet TAKE 1 TABLET(1 MG) BY MOUTH EVERY 8 HOURS AS NEEDED FOR ANXIETY   pantoprazole  (PROTONIX ) 40 MG tablet TAKE 1 TABLET(40 MG) BY MOUTH TWICE DAILY BEFORE A MEAL   Pyridoxine HCl (B-6 PO) Take 1 tablet by mouth daily.   Thiamine  HCl (B-1 PO) Take 1 tablet  by mouth daily.   triamcinolone  ointment (KENALOG ) 0.1 % Apply 1 Application topically 2 (two) times daily.   No facility-administered encounter medications on file as of 07/30/2024.    Allergies (verified) Sorbitol; Tomato; Zofran ; Advil  [ibuprofen ]; Cephalexin ; Diphenhydramine  hcl; Morphine  and codeine; Tramadol ; Antihistamines, chlorpheniramine-type; and Oxycodone    History: Past Medical History:  Diagnosis Date   ALKALINE PHOSPHATASE, ELEVATED 03/15/2009   Allergic state 06/10/2012   Allergy     Anemia    Anxiety and depression 04/28/2011   Arthritis    Asthma    Atypical chest pain  11/30/2011   AVM (arteriovenous malformation) of colon 2011   cecum   Baker's cyst of knee 05/22/2011   Cancer (HCC) 11/2006   XRT/chemo 01-02/ lobular invasive ca   Carotid artery disease (HCC)    a. Carotid duplex 03/2014: stable 1-39% BICA, f/u due 03/2016.   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     Clotting disorder (HCC)    COPD (chronic obstructive pulmonary disease) (HCC)    Dementia (HCC)    Dermatitis 11/23/2012   Dyspnea    EE (eosinophilic esophagitis)    Emphysema of lung (HCC)    Esophageal ring    ESOPHAGEAL STRICTURE 03/29/2009   Fall 11/23/2012   Family history of breast cancer    Family history of colon cancer    Family history of ovarian cancer    Family history of pancreatic cancer    Folliculitis of nose 12/31/2011   GERD (gastroesophageal reflux disease) 09/29/2009   improved s/p cholecystectomy and esophagus dilatation   History of chicken pox    History of measles    History of shingles    2 episodes   Hx of echocardiogram    a. Echo 01/2013: mild LVH, EF 55-60%, normal wall motion, Gr 1 diast dysfn   Hyperlipidemia    Hypertension    Knee pain, bilateral 07/23/2011   Medicare annual wellness visit, subsequent 06/19/2015   Mixed hyperlipidemia 10/17/2010   Qualifier: Diagnosis of  By: Edsel Mems     Orthostasis    Osteopenia 03/14/2011   Osteoporosis    Personal history of chemotherapy 2001   Personal history of radiation therapy 2001   rt breast   PERSONAL HX BREAST CANCER 09/29/2009   Pneumonia    PVC's (premature ventricular contractions)    a. Event monitor 01/2013: NSR, extensive PVCs.   Radial neck fracture 10/2011   minimally displaced   Substance abuse (HCC)    In remission 3 years.    Urinary incontinence 03/19/2012   Vaginitis 05/22/2011   Past Surgical History:  Procedure Laterality Date   APPENDECTOMY     AUGMENTATION MAMMAPLASTY Right  05/27/2007   BREAST BIOPSY Right 01/02/2007   wire loc   BREAST BIOPSY  12/26/2006   BREAST LUMPECTOMY Right 2001   BREAST RECONSTRUCTION  2008, 2009, 2010   BREAST REDUCTION WITH MASTOPEXY Left 05/30/2017   Procedure: LEFT BREAST REDUCTION FOR SYMTERY WITH MASTOPEXY;  Surgeon: Lowery Estefana RAMAN, DO;  Location: [facility];  Service: Plastics;  Laterality: Left;   CHOLECYSTECTOMY  2010   COLONOSCOPY  09/05/10   [facility]   DENTAL SURGERY  05/2016   4 dental implants by Dr. Dorthula.   ERCP  2010    CBD stone extraction    ESOPHAGOGASTRODUODENOSCOPY  01/08/2012   Procedure: ESOPHAGOGASTRODUODENOSCOPY (EGD);  Surgeon: Lupita FORBES Commander, MD;  Location: [facility];  Service:  Endoscopy;  Laterality: N/A;   ESOPHAGOGASTRODUODENOSCOPY (EGD) WITH ESOPHAGEAL DILATION  2010, 2012   LAPAROSCOPIC APPENDECTOMY N/A 08/04/2016   Procedure: APPENDECTOMY LAPAROSCOPIC;  Surgeon: Elspeth Schultze, MD;  Location: [facility];  Service: General;  Laterality: N/A;   LIPOSUCTION WITH LIPOFILLING Left 11/21/2017   Procedure: LIPOSUCTION FROM ABDOMEN WITH LIPOFILLING TO LEFT BREAST;  Surgeon: Lowery Estefana RAMAN, DO;  Location: [facility];  Service: Plastics;  Laterality: Left;   MASTECTOMY MODIFIED RADICAL Right 05/27/2007   , Mastectomy modified radical (08), breast reconstruction, CA lesions excised lateral abd wall 2010   MASTOPEXY Left 11/21/2017   Procedure: LEFT BREAST REVISION MASTOPEXY FOR SYMMETRY;  Surgeon: Lowery Estefana RAMAN, DO;  Location:  SURGERY CENTER;  Service: Plastics;  Laterality: Left;   REDUCTION MAMMAPLASTY Left    SAVORY DILATION  01/08/2012   Procedure: SAVORY DILATION;  Surgeon: Lupita FORBES Commander, MD;  Location: [facility];  Service: Endoscopy;  Laterality: N/A;  need xray   Family History  Problem Relation Age of Onset   Other Mother        tic [place]   Heart disease Father    Lung cancer Father        smoker   Cirrhosis Sister        Primary  Biliary   Anxiety disorder Sister    Osteoporosis Sister    Arthritis Sister        Rheumatoid   Osteoporosis Sister    Skin cancer Sister        multiple skin cancers, over 90 excisions.   Stroke Maternal Grandmother    Alcohol abuse Maternal Grandfather    Heart disease Paternal Grandfather    Esophageal cancer Paternal Grandfather    Rectal cancer Paternal Grandfather    Breast cancer Maternal Aunt        dx in her 64s   Breast cancer Maternal Aunt        dx in her 91s   Breast cancer Maternal Aunt        possible breast cancer dx and died in her 48s   Ovarian cancer Maternal Aunt 29   Pancreatic cancer Maternal Aunt        dx in her 58s   Pancreatic cancer Maternal Uncle        dx in his 67s; smoker   Stomach cancer Maternal Uncle    Cancer Maternal Uncle    Breast cancer Paternal Aunt        dx in her 53s   Lung cancer Paternal Aunt    Breast cancer Cousin        paternal first cousin   Breast cancer Cousin        maternal first cousin   Anesthesia problems Neg Hx    Hypotension Neg Hx    Malignant hyperthermia Neg Hx    Pseudochol deficiency Neg Hx    Social History   Socioeconomic History   Marital status: Married    Spouse name: Sam   Number of children: 3   Years of education: Not on file   Highest education level: 12th grade  Occupational History   Occupation: Arboriculturist  Tobacco Use   Smoking status: Former    Current packs/day: 0.00    Average packs/day: 1 pack/day for 50.0 years (50.0 ttl pk-yrs)    Types: Cigarettes    Start date: 05/21/1957    Quit date: 05/22/2007    Years since quitting: 17.2   Smokeless tobacco: Never  Vaping  Use   Vaping status: Never Used  Substance and Sexual Activity   Alcohol use: Not Currently   Drug use: No   Sexual activity: Yes    Partners: Male    Comment: lives with husband,   Other Topics Concern   Not on file  Social History Narrative   Not on file   Social Drivers of Health   Financial Resource  Strain: Low Risk  (07/30/2024)   Overall Financial Resource Strain (CARDIA)    Difficulty of Paying Living Expenses: Not hard at all  Food Insecurity: No Food Insecurity (07/30/2024)   Hunger Vital Sign    Worried About Running Out of Food in the Last Year: Never true    Ran Out of Food in the Last Year: Never true  Transportation Needs: No Transportation Needs (07/30/2024)   PRAPARE - Administrator, Civil Service (Medical): No    Lack of Transportation (Non-Medical): No  Physical Activity: Sufficiently Active (07/30/2024)   Exercise Vital Sign    Days of Exercise per Week: 5 days    Minutes of Exercise per Session: 30 min  Stress: No Stress Concern Present (07/30/2024)   [employer] of Occupational Health - Occupational Stress Questionnaire    Feeling of Stress: Not at all  Social Connections: Socially Integrated (07/30/2024)   Social Connection and Isolation Panel    Frequency of Communication with Friends and Family: More than three times a week    Frequency of Social Gatherings with Friends and Family: More than three times a week    Attends Religious Services: More than 4 times per year    Active Member of [employer] or Organizations: Yes    Attends Engineer, structural: More than 4 times per year    Marital Status: Married    Tobacco Counseling Counseling given: Not Answered    Clinical Intake:  Pre-visit preparation completed: Yes  Pain : No/denies pain Pain Score: 0-No pain     BMI - recorded: 22.13 Nutritional Risks: None Diabetes: No  Lab Results  Component Value Date   HGBA1C 6.0 04/04/2023   HGBA1C 6.0 08/29/2022   HGBA1C 5.7 02/01/2022     How often do you need to have someone help you when you read instructions, pamphlets, or other written materials from your doctor or pharmacy?: 1 - Never  Interpreter Needed?: No  Information entered by :: Rojelio Blush LPN   Activities of Daily Living     07/30/2024    2:08 PM  In your  present state of health, do you have any difficulty performing the following activities:  Hearing? 0  Vision? 0  Difficulty concentrating or making decisions? 0  Walking or climbing stairs? 0  Dressing or bathing? 0  Doing errands, shopping? 0  Preparing Food and eating ? N  Using the Toilet? N  In the past six months, have you accidently leaked urine? N  Do you have problems with loss of bowel control? N  Managing your Medications? N  Managing your Finances? N  Housekeeping or managing your Housekeeping? N    Patient Care Team: Domenica Harlene LABOR, MD as PCP - General (Family Medicine) Okey Vina GAILS, MD as PCP - Cardiology (Cardiology) Ernie Cough, MD as Consulting Physician (Orthopedic Surgery) Avram Lupita BRAVO, MD as Consulting Physician (Gastroenterology) Octavia Charleston, MD as Consulting Physician (Ophthalmology) Selinda Jacobsen, DDS (Oral Surgery) Pasco Medin (Dentistry) Haverstock, Tawni CROME, MD as Referring Physician (Dermatology) Dillingham, Estefana RAMAN, DO as Attending Physician (Plastic  Surgery) Lanny Callander, MD as Consulting Physician (Hematology) Day, [place], [facility] (Inactive) as Pharmacist (Pharmacist)  I have updated your Care Teams any recent Medical Services you may have received from other providers in the past year.     Assessment:   This is a routine wellness examination for Anne [name].  Hearing/Vision screen Hearing Screening - Comments:: Denies hearing difficulties   Vision Screening - Comments:: Wears rx glasses - up to date with routine eye exams with  Dr Octavia   Goals Addressed               This Visit's Progress     Continue physical activity (pt-stated)        Remain active.       Depression Screen     07/30/2024    2:05 PM 03/22/2023    2:23 PM 11/22/2022    2:22 PM 11/22/2022    2:20 PM 09/04/2022    2:52 PM 08/07/2022   11:19 AM 05/03/2022   10:02 AM  PHQ 2/9 Scores  PHQ - 2 Score 0 0 2 0 0 2 3  PHQ- 9 Score   3   5 9     Fall Risk      07/30/2024    2:08 PM 06/26/2024    1:01 PM 03/22/2023    2:36 PM 11/22/2022    2:20 PM 08/07/2022   11:19 AM  Fall Risk   Falls in the past year? 1 1 1   0  Comment   tripped over an elevation in the sidewalk    Number falls in past yr: 0 0 0 1 0  Injury with Fall? 0 0 1 1 0  Risk for fall due to : No Fall Risks  History of fall(s)    Follow up Falls evaluation completed Falls evaluation completed Falls evaluation completed Falls evaluation completed  Falls evaluation completed      Data saved with a previous flowsheet row definition    MEDICARE RISK AT HOME:  Medicare Risk at Home Any stairs in or around the home?: Yes If so, are there any without handrails?: No Home free of loose throw rugs in walkways, pet beds, electrical cords, etc?: Yes Adequate lighting in your home to reduce risk of falls?: Yes Life alert?: No Use of a cane, walker or w/c?: No Grab bars in the bathroom?: Yes Shower chair or bench in shower?: No Elevated toilet seat or a handicapped toilet?: No  TIMED UP AND GO:  Was the test performed?  No  Cognitive Function: 6CIT completed    03/11/2017   11:33 AM  MMSE - Mini Mental State Exam  Orientation to time 5   Orientation to Place 5   Registration 3   Attention/ Calculation 5   Recall 3   Language- name 2 objects 2   Language- repeat 1  Language- follow 3 step command 3   Language- read & follow direction 1   Write a sentence 1   Copy design 1   Total score 30      Data saved with a previous flowsheet row definition        07/30/2024    2:10 PM 03/22/2023    2:33 PM 02/27/2022    3:04 PM  6CIT Screen  What Year? 0 points 0 points 0 points  What month? 0 points 0 points 0 points  What time? 0 points 0 points 0 points  Count back from 20 0 points 2 points 0 points  Months  in reverse 0 points 0 points 0 points  Repeat phrase 0 points 6 points 10 points  Total Score 0 points 8 points 10 points    Immunizations Immunization History   Administered Date(s) Administered   Fluad  Quad(high Dose 65+) 09/17/2019, 10/02/2021, 11/01/2022   Fluad  Trivalent(High Dose 65+) 09/27/2023   INFLUENZA, HIGH DOSE SEASONAL PF 08/01/2016, 09/19/2017, 09/27/2020   Influenza Split 08/27/2011, 10/21/2012   Influenza, Quadrivalent, Recombinant, Inj, Pf 08/19/2018   Influenza,inj,Quad PF,6+ Mos 07/31/2013, 12/07/2014, 11/01/2015   PFIZER(Purple Top)SARS-COV-2 Vaccination 01/10/2020, 02/09/2020, 09/27/2020   Pfizer Covid-19 Vaccine Bivalent Booster 48yrs & up 10/25/2021   Pfizer(Comirnaty )Fall Seasonal Vaccine 12 years and older 01/09/2023, 09/27/2023   Tdap 04/08/2013, 11/09/2022    Screening Tests Health Maintenance  Topic Date Due   Pneumococcal Vaccine: 50+ Years (1 of 2 - PCV) Never done   Influenza Vaccine  06/12/2024   COVID-19 Vaccine (7 - Pfizer risk 2024-25 season) 07/13/2024   Zoster Vaccines- Shingrix (1 of 2) 06/26/2025 (Originally 09/08/1960)   Medicare Annual Wellness (AWV)  07/30/2025   DTaP/Tdap/Td (3 - Td or Tdap) 11/09/2032   DEXA SCAN  Completed   HPV VACCINES  Aged Out   Meningococcal B Vaccine  Aged Out   Colonoscopy  Discontinued    Health Maintenance Items Addressed:   Additional Screening:  Vision Screening: Recommended annual ophthalmology exams for early detection of glaucoma and other disorders of the eye. Is the patient up to date with their annual eye exam?  Yes  Who is the provider or what is the name of the office in which the patient attends annual eye exams? Dr Octavia  Dental Screening: Recommended annual dental exams for proper oral hygiene  Community Resource Referral / Chronic Care Management: CRR required this visit?  No   CCM required this visit?  No   Plan:    I have personally reviewed and noted the following in the patient's chart:   Medical and social history Use of alcohol, tobacco or illicit drugs  Current medications and supplements including opioid prescriptions. Patient is  not currently taking opioid prescriptions. Functional ability and status Nutritional status Physical activity Advanced directives List of other physicians Hospitalizations, surgeries, and ER visits in previous 12 months Vitals Screenings to include cognitive, depression, and falls Referrals and appointments  In addition, I have reviewed and discussed with patient certain preventive protocols, quality metrics, and best practice recommendations. A written personalized care plan for preventive services as well as general preventive health recommendations were provided to patient.   Rojelio LELON Blush, LPN   [id number]   After Visit Summary: (MyChart) Due to this being a telephonic visit, the after visit summary with patients personalized plan was offered to patient via MyChart   Notes: Nothing significant to report at this time.

## 2024-08-07 ENCOUNTER — Encounter: Payer: Self-pay | Admitting: Internal Medicine

## 2024-08-07 NOTE — Telephone Encounter (Signed)
 I spoke with patient's husband.  He reports patient took Verzenio last evening with Diltiazem  and had severe diarrhea.  She has been having some diarrhea but last night was severe episode.Husband states he read diarrhea can be side effect when Verzenio and diltiazem  are taken together.   Chart indicates diltiazem  is as needed.  Husband reports patient has been taking everyday.  I let him know this was changed to as needed at 08/27/23 office visit-  Stop daily diltiazem  and instead take PRN for palpitations   Husband will have patient change diltiazem  to as needed and see if diarrhea improves.   Husband reports patient was taken off Atorvastatin  because of abnormal liver function tests.  He is asking if there is another medication patient can take in place of Atorvastatin 

## 2024-08-07 NOTE — Telephone Encounter (Signed)
Call placed to patient to get more information. Left message to call office

## 2024-08-11 ENCOUNTER — Other Ambulatory Visit: Payer: Self-pay

## 2024-08-11 ENCOUNTER — Emergency Department (HOSPITAL_BASED_OUTPATIENT_CLINIC_OR_DEPARTMENT_OTHER)
Admission: EM | Admit: 2024-08-11 | Discharge: 2024-08-11 | Disposition: A | Discharge status: Home / Self Care | Attending: Emergency Medicine | Admitting: Emergency Medicine

## 2024-08-11 ENCOUNTER — Emergency Department (HOSPITAL_BASED_OUTPATIENT_CLINIC_OR_DEPARTMENT_OTHER)

## 2024-08-11 ENCOUNTER — Ambulatory Visit: Payer: Self-pay

## 2024-08-11 DIAGNOSIS — R42 Dizziness and giddiness: Secondary | ICD-10-CM | POA: Insufficient documentation

## 2024-08-11 DIAGNOSIS — E876 Hypokalemia: Secondary | ICD-10-CM | POA: Insufficient documentation

## 2024-08-11 DIAGNOSIS — W1830XA Fall on same level, unspecified, initial encounter: Secondary | ICD-10-CM | POA: Diagnosis not present

## 2024-08-11 DIAGNOSIS — M47812 Spondylosis without myelopathy or radiculopathy, cervical region: Secondary | ICD-10-CM | POA: Diagnosis not present

## 2024-08-11 DIAGNOSIS — S0990XA Unspecified injury of head, initial encounter: Secondary | ICD-10-CM

## 2024-08-11 DIAGNOSIS — Z853 Personal history of malignant neoplasm of breast: Secondary | ICD-10-CM | POA: Diagnosis not present

## 2024-08-11 DIAGNOSIS — R0789 Other chest pain: Secondary | ICD-10-CM | POA: Diagnosis not present

## 2024-08-11 DIAGNOSIS — Z7982 Long term (current) use of aspirin: Secondary | ICD-10-CM | POA: Insufficient documentation

## 2024-08-11 DIAGNOSIS — R519 Headache, unspecified: Secondary | ICD-10-CM | POA: Insufficient documentation

## 2024-08-11 DIAGNOSIS — I6523 Occlusion and stenosis of bilateral carotid arteries: Secondary | ICD-10-CM | POA: Diagnosis not present

## 2024-08-11 DIAGNOSIS — W19XXXA Unspecified fall, initial encounter: Secondary | ICD-10-CM

## 2024-08-11 DIAGNOSIS — H9201 Otalgia, right ear: Secondary | ICD-10-CM | POA: Diagnosis not present

## 2024-08-11 LAB — COMPREHENSIVE METABOLIC PANEL WITH GFR
ALT: 11 U/L (ref 0–44)
AST: 19 U/L (ref 15–41)
Albumin: 4.2 g/dL (ref 3.5–5.0)
Alkaline Phosphatase: 75 U/L (ref 38–126)
Anion gap: 11 (ref 5–15)
BUN: 11 mg/dL (ref 8–23)
CO2: 26 mmol/L (ref 22–32)
Calcium: 9.3 mg/dL (ref 8.9–10.3)
Chloride: 103 mmol/L (ref 98–111)
Creatinine, Ser: 0.65 mg/dL (ref 0.44–1.00)
GFR, Estimated: >60 mL/min (ref >60)
Glucose, Bld: 100 mg/dL — ABNORMAL HIGH (ref 70–99)
Potassium: 3.4 mmol/L — ABNORMAL LOW (ref 3.5–5.1)
Sodium: 140 mmol/L (ref 135–145)
Total Bilirubin: 0.3 mg/dL (ref 0.0–1.2)
Total Protein: 7.3 g/dL (ref 6.5–8.1)

## 2024-08-11 LAB — CBC
HCT: 36.6 % (ref 36.0–46.0)
Hemoglobin: 12.1 g/dL (ref 12.0–15.0)
MCH: 33 pg (ref 26.0–34.0)
MCHC: 33.1 g/dL (ref 30.0–36.0)
MCV: 99.7 fL (ref 80.0–100.0)
Platelets: 277 K/uL (ref 150–400)
RBC: 3.67 MIL/uL — ABNORMAL LOW (ref 3.87–5.11)
RDW: 13.6 % (ref 11.5–15.5)
WBC: 5 K/uL (ref 4.0–10.5)
nRBC: 0 % (ref 0.0–0.2)

## 2024-08-11 MED ORDER — ACETAMINOPHEN 160 MG/5ML PO SOLN
650.0000 mg | Freq: Once | ORAL | Status: AC
  Administered 2024-08-11: 650 mg via ORAL
  Filled 2024-08-11: qty 20.3

## 2024-08-11 MED ORDER — ACETAMINOPHEN 325 MG PO TABS
650.0000 mg | ORAL_TABLET | Freq: Once | ORAL | Status: DC
  Filled 2024-08-11: qty 2

## 2024-08-11 MED ORDER — SODIUM CHLORIDE 0.9 % IV BOLUS
1000.0000 mL | Freq: Once | INTRAVENOUS | Status: AC
  Administered 2024-08-11: 1000 mL via INTRAVENOUS

## 2024-08-11 NOTE — Telephone Encounter (Signed)
 Patient's husband was advised per Dr. Antonio Meth to take pt to the emergency department due to hitting head and headache is worse since the fall last night. Pt's husband verbalized understanding and agreed.

## 2024-08-11 NOTE — Telephone Encounter (Signed)
 FYI Only or Action Required?: Action required by provider: request for appointment and clinical question for provider.  Patient was last seen in primary care on 06/26/2024 by Wheeler Harlene CROME, NP.  Called Nurse Triage reporting Fall.  Symptoms began yesterday.  Interventions attempted: OTC medications: tylenol .  Symptoms are: unchanged.  Triage Disposition: See HCP Within 4 Hours (Or PCP Triage)  Patient/caregiver understands and will follow disposition?: No, wishes to speak with PCP  Copied from CRM #[id number]. Topic: Clinical - Red Word Triage >> Aug 11, 2024 11:48 AM Roselie BROCKS wrote: [facility] that prompted transfer to Nurse Triage: Patients husband (sam Calbert) states the patient fell last night, hit her head and got a headache and continues to get worse , and feels really bad pain from the fall Reason for Disposition  [1] Age over 64 years AND [2] swelling or bruise    No bruising, head injury  Answer Assessment - Initial Assessment Questions 1. MECHANISM: How did the fall happen?     Going to restroom, fell backwards and hit the back of the head  3. ONSET: When did the fall happen? (e.g., minutes, hours, or days ago)     Last night 4. LOCATION: What part of the body hit the ground? (e.g., back, buttocks, head, hips, knees, hands, head, stomach)     head 5. INJURY: Did you hurt (injure) yourself when you fell? If Yes, ask: What did you injure? Tell me more about this? (e.g., body area; type of injury; pain severity)     Back of head 6. PAIN: Is there any pain? If Yes, ask: How bad is the pain? (e.g., Scale 0-10; or none, mild,      3/10; dull HA, Tylenol  7. SIZE: For cuts, bruises, or swelling, ask: How large is it? (e.g., inches or centimeters)      no  9. OTHER SYMPTOMS: Do you have any other symptoms? (e.g., dizziness, fever, weakness; new-onset or worsening).      Denies blurred vision, dizziness Weakness both feet,  10. CAUSE: What do you think  caused the fall (or falling)? (e.g., dizzy spell, tripped)       Not sure, just happened  Answer Assessment - Initial Assessment Questions No available appts today. Advised ED. Pt's spouse requesting work/in visit and CALL BACK.  1. MECHANISM: How did the injury happen? For falls, ask: What height did you fall from? and What surface did you fall against?      Unsure how fell, few days ago was having diarrhea, thought was an episode. 2. ONSET: When did the injury happen? (e.g., minutes, hours ago)      Last night 3. NEUROLOGIC SYMPTOMS: Was there any loss of consciousness? Are there any other neurological symptoms?      denies 4. MENTAL STATUS: Does the person know who they are, who you are, and where they are?      Alert and oriented 5. LOCATION: What part of the head was hit?      Back of head 6. SCALP APPEARANCE: What does the scalp look like? Is it bleeding now? If Yes, ask: Is it difficult to stop?      No bleeding 7. SIZE: For cuts, bruises, or swelling, ask: How large is it? (e.g., inches or centimeters)      no 8. PAIN: Is there any pain? If Yes, ask: How bad is it? (Scale 0-10; or none, mild, moderate, severe)     3/10, dull HA, tylenol   10. BLOOD THINNERS:  Do you take any blood thinners? (e.g., aspirin , clopidogrel / Plavix, coumadin, heparin). Notes: Other strong blood thinners include: Arixtra (fondaparinux), Eliquis (apixaban), Pradaxa (dabigatran), and Xarelto (rivaroxaban).       Aspirin , pt's husband reports stopped 3 days ago due to diarrhea; not current diarrhea. 11. OTHER SYMPTOMS: Do you have any other symptoms? (e.g., neck pain, vomiting)       Denies dizziness, blurred vision, n/v, diff walking/talking, clear drainage from nose or ears, no bleeding. Patient reports felt dizzy and walking diff last night after fall.  Protocols used: Falls and Falling-A-AH, Head Injury-A-AH

## 2024-08-11 NOTE — ED Triage Notes (Signed)
 Pt reports falling last night, legs gave out.  Hit back of head, everything looked funny after fall.   Dizziness with exertion today. Also c/o shooting pain in R ear.

## 2024-08-11 NOTE — ED Provider Notes (Signed)
 Signout from [employer] at shift change. Briefly, patient presents for fall, mechanical in nature. Reports positional dizziness currently.    Plan: Follow-up head/cervical spine CT  7:21 PM EKG HR 87, NSR   8:22 PM Reassessment performed. Patient appears stable.  She ambulated well to the restroom.  Dizziness is currently improved.  Patient states that she still has a headache and does not quite feel like herself, but is not concerned about discharge tonight.  Labs and imaging personally reviewed and interpreted including: CBC with normal white blood cell count and hemoglobin; CMP with potassium 3.4 otherwise unremarkable with normal kidney function.  CT of the head and cervical spine without acute traumatic abnormality.   Reviewed additional pertinent lab work and imaging with patient and family member at bedside.  We discussed signs and symptoms of concussion and need to follow-up with these are persistent.  Avoid activities which make her symptoms worse.  Avoid putting herself in any dangerous situations.  Most current vital signs reviewed and are as follows: BP 125/68   Pulse (!) 46   Temp 98.4 F (36.9 C)   Resp 18   Ht 5' 5 (1.651 m)   SpO2 99%   BMI 22.13 kg/m   Plan: Discharge.  Patient states she has appointments at [facility] and she would like to make these tomorrow.   Home treatment: OTC medications, avoidance of triggers   Return and follow-up instructions: Encouraged return to ED with headache, confusion, vomiting. Encouraged patient to follow-up with their provider in 3 days with any persistent symptoms. Patient verbalized understanding and agreed with plan.        Desiderio Chew, PA-C 08/11/24 2024    Yolande Lamar BROCKS, MD 08/16/24 1254

## 2024-08-11 NOTE — ED Notes (Signed)
 Ambulated with pt appx 16ft without issue. Pt had a steady gait per baseline

## 2024-08-11 NOTE — ED Provider Notes (Addendum)
 [place] [facility]ERGENCY DEPARTMENT AT [facility] Provider Note   CSN: [id number] Arrival date & time: 08/11/24  1705     Patient presents with: [place]   Anne [name] is a 83 y.o. female patient with history of breast cancer who presents to the emergency department today for dizziness after a mechanical fall that occurred last night.  Patient states that she was having large-volume and frequency diarrhea from Thursday until Saturday.  That is since resolved.  She was lacking p.o. intake during this time.  She states that last night she went up to go to the bathroom and she states that she lost her balance and fell backwards and hit her head.  She did not lose consciousness but upon getting up she was dizzy.  She also notes a since then with certain positional movements she has been dizzy.  Also complaining of a headache.  Headache is localized to the right side of the head and into the ear.  She denies any visual disturbances, focal weakness, focal numbness.  She denies any nausea, vomiting, chest pain, shortness of breath, fever, chills.    Fall       Prior to Admission medications   Medication Sig Start Date End Date Taking? Authorizing Provider  acetaminophen  (TYLENOL ) 500 MG tablet Take 500-1,000 mg by mouth every 6 (six) hours as needed for moderate pain.    [provider]  amitriptyline  (ELAVIL ) 25 MG tablet Take 4 tablets (100 mg total) by mouth at bedtime. 06/08/24   Domenica Harlene LABOR, MD  aspirin  81 MG EC tablet Take 81 mg by mouth daily.    [provider]  Biotin 5 MG CAPS Take 5 mg by mouth daily.    [provider]  Brimonidine Tartrate (LUMIFY) 0.025 % SOLN Place 1 drop into both eyes daily.    [provider]  Cholecalciferol (VITAMIN D3) 1000 UNITS CAPS Take 2,000 Units by mouth daily.    [provider]  Cyanocobalamin  (VITAMIN B 12 PO) Take 2 tablets by mouth daily.    [provider]  diltiazem  (CARDIZEM ) 30 MG  tablet As needed 08/27/23   Johnson, Kathleen R, PA-C  diphenoxylate -atropine  (LOMOTIL ) 2.5-0.025 MG tablet Take 1 tablet by mouth 4 (four) times daily as needed for diarrhea or loose stools. 07/23/24   Roselyn Carlin NOVAK, MD  FLUoxetine  (PROZAC ) 20 MG capsule Take 3 capsules (60 mg total) by mouth daily. 03/18/24   Domenica Harlene LABOR, MD  Folic Acid  (FOLATE PO) Take 1 tablet by mouth daily.    [provider]  HYDROcodone -acetaminophen  (HYCET) 7.5-325 mg/15 ml solution Take 10 mLs by mouth every 4 (four) hours as needed for moderate pain (pain score 4-6). 07/21/24 07/21/25  Maranda Jamee Jacob, MD  ipratropium (ATROVENT ) 0.06 % nasal spray Place 2 sprays into both nostrils 4 (four) times daily. 03/18/23   Van Knee, MD  LORazepam  (ATIVAN ) 1 MG tablet TAKE 1 TABLET(1 MG) BY MOUTH EVERY 8 HOURS AS NEEDED FOR ANXIETY 07/03/24   Domenica Harlene LABOR, MD  pantoprazole  (PROTONIX ) 40 MG tablet TAKE 1 TABLET(40 MG) BY MOUTH TWICE DAILY BEFORE A MEAL 06/29/24   Avram Lupita BRAVO, MD  Pyridoxine HCl (B-6 PO) Take 1 tablet by mouth daily.    [provider]  Thiamine  HCl (B-1 PO) Take 1 tablet by mouth daily.    [provider]  triamcinolone  ointment (KENALOG ) 0.1 % Apply 1 Application topically 2 (two) times daily. 07/21/24   Maranda Jamee Jacob,  MD    Allergies: Sorbitol; Tomato; Zofran ; Advil  [ibuprofen ]; Cephalexin ; Diphenhydramine  hcl; Morphine  and codeine; Tramadol ; Antihistamines, chlorpheniramine-type; and Oxycodone     Review of Systems  All other systems reviewed and are negative.   Updated Vital Signs BP 125/68   Pulse (!) 46   Temp 98.4 F (36.9 C)   Resp 18   Ht 5' 5 (1.651 m)   SpO2 99%   BMI 22.13 kg/m   Physical Exam Vitals and nursing note reviewed.  Constitutional:      General: She is not in acute distress.    Appearance: Normal appearance.  HENT:     Head: Normocephalic and atraumatic.     Right Ear: Tympanic membrane, ear canal and external ear normal.      Left Ear: Tympanic membrane, ear canal and external ear normal.  Eyes:     General:        Right eye: No discharge.        Left eye: No discharge.  Cardiovascular:     Comments: Regular rate and rhythm.  S1/S2 are distinct without any evidence of murmur, rubs, or gallops.  Radial pulses are 2+ bilaterally.  Dorsalis pedis pulses are 2+ bilaterally.  No evidence of pedal edema. Pulmonary:     Comments: Clear to auscultation bilaterally.  Normal effort.  No respiratory distress.  No evidence of wheezes, rales, or rhonchi heard throughout. Abdominal:     General: Abdomen is flat. Bowel sounds are normal. There is no distension.     Tenderness: There is no abdominal tenderness. There is no guarding or rebound.  Musculoskeletal:        General: Normal range of motion.     Cervical back: Neck supple.  Skin:    General: Skin is warm and dry.     Findings: No rash.  Neurological:     General: No focal deficit present.     Mental Status: She is alert.     Comments: Cranial nerves II through XII are intact.  5/5 strength to the upper and lower extremities.  Normal sensation to the upper and lower extremities.  Normal finger-nose without any evidence of dysmetria.  Speech is normal.  Answers all questions appropriately.  Psychiatric:        Mood and Affect: Mood normal.        Behavior: Behavior normal.     (all labs ordered are listed, but only abnormal results are displayed) Labs Reviewed  COMPREHENSIVE METABOLIC PANEL WITH GFR - Abnormal; Notable for the following components:      Result Value   Potassium 3.4 (*)    Glucose, Bld 100 (*)    All other components within normal limits  CBC - Abnormal; Notable for the following components:   RBC 3.67 (*)    All other components within normal limits    EKG: EKG Interpretation Date/Time:  Tuesday August 11 2024 17:45:03 EDT Ventricular Rate:  87 PR Interval:  165 QRS Duration:  142 QT Interval:  432 QTC Calculation: 520 R  Axis:   41  Text Interpretation: Sinus rhythm Right bundle branch block Confirmed by Anne [name] [phone]) on 08/11/2024 6:09:07 PM  Radiology: No results found.   Procedures   Medications Ordered in the ED  acetaminophen  (TYLENOL ) tablet 650 mg (has no administration in time range)  sodium chloride  0.9 % bolus 1,000 mL (0 mLs Intravenous Stopped 08/11/24 1845)    Clinical Course as of 08/11/24 1907  Tue Aug 11, 2024  1904 CBCROLLEN)  Normal.  [CF]  1904 Comprehensive metabolic panel(!) Normal.  [CF]    Clinical Course User Index [CF] Theotis Cameron HERO, PA-C    Medical Decision Making Anne [name] is a 83 y.o. female patient who presents to the emergency department today for further evaluation of a fall.  Given that the patient has been dizzy this could be related to possible concussion syndrome or related to hypovolemia and dehydration from the diarrhea she had several days prior.  Will plan to give the patient some fluids and check some basic electrolytes and labs.  I will also scan her head and neck to look for any intracranial hemorrhage or any other emergent causes of her dizziness at this time.  Overall I below suspicion for stroke.  Her neurological exam is completely normal.  She has fantastic strength and sensation in all 4 extremities.  No obvious cerebellar abnormalities at this time.  Basic labs look good.  Mild hypokalemia.  Still waiting on scans to result.  Patient is complaining of headache.  Will plan to give her some Tylenol .  She last took Tylenol  at 11 AM and this seemed to improve some of her headache that she had.  Patient somewhat equivocal on whether or not she feels better after fluids.  Due to shift change, the rest of the patient's care will be transferred to oncoming provider with ultimate disposition will be made.  If patient feels better after some Tylenol  and reassessment she can likely go home.   Amount and/or Complexity of Data Reviewed Labs: ordered.  Decision-making details documented in ED Course. Radiology: ordered.  Risk OTC drugs.     Final diagnoses:  None    ED Discharge Orders     None          Theotis Cameron HERO, [place] 08/11/24 1908    Theotis Cameron Rivers, PA-C 08/11/24 1910    Anne Lamar BROCKS, MD 08/16/24 1254

## 2024-08-11 NOTE — Discharge Instructions (Signed)
 Your head CT and neck CT tonight did not show any signs of significant traumatic injury.  No other concerning findings.  Your lab work showed normal blood cell counts, electrolytes and kidney function.  Monitor for signs and symptoms of a concussion.  If these persist, please follow-up with your primary care doctor.

## 2024-08-12 DIAGNOSIS — E860 Dehydration: Secondary | ICD-10-CM | POA: Diagnosis not present

## 2024-08-12 DIAGNOSIS — Z9049 Acquired absence of other specified parts of digestive tract: Secondary | ICD-10-CM | POA: Diagnosis not present

## 2024-08-12 DIAGNOSIS — C50911 Malignant neoplasm of unspecified site of right female breast: Secondary | ICD-10-CM | POA: Diagnosis not present

## 2024-08-12 DIAGNOSIS — Z23 Encounter for immunization: Secondary | ICD-10-CM | POA: Diagnosis not present

## 2024-08-12 DIAGNOSIS — C792 Secondary malignant neoplasm of skin: Secondary | ICD-10-CM | POA: Diagnosis not present

## 2024-08-12 DIAGNOSIS — J439 Emphysema, unspecified: Secondary | ICD-10-CM | POA: Diagnosis not present

## 2024-08-12 DIAGNOSIS — Z9181 History of falling: Secondary | ICD-10-CM | POA: Diagnosis not present

## 2024-08-12 DIAGNOSIS — Z5111 Encounter for antineoplastic chemotherapy: Secondary | ICD-10-CM | POA: Diagnosis not present

## 2024-08-12 DIAGNOSIS — R7989 Other specified abnormal findings of blood chemistry: Secondary | ICD-10-CM | POA: Diagnosis not present

## 2024-08-12 DIAGNOSIS — R0602 Shortness of breath: Secondary | ICD-10-CM | POA: Diagnosis not present

## 2024-08-12 DIAGNOSIS — K838 Other specified diseases of biliary tract: Secondary | ICD-10-CM | POA: Diagnosis not present

## 2024-08-12 DIAGNOSIS — M8580 Other specified disorders of bone density and structure, unspecified site: Secondary | ICD-10-CM | POA: Diagnosis not present

## 2024-08-12 DIAGNOSIS — Z170 Estrogen receptor positive status [ER+]: Secondary | ICD-10-CM | POA: Diagnosis not present

## 2024-08-12 DIAGNOSIS — Z1732 Human epidermal growth factor receptor 2 negative status: Secondary | ICD-10-CM | POA: Diagnosis not present

## 2024-08-13 ENCOUNTER — Encounter: Payer: Self-pay | Admitting: Family Medicine

## 2024-08-14 NOTE — Progress Notes (Deleted)
   Acute Office Visit  Subjective:     Patient ID: Anne [name], female    DOB: 01/04/1941, 83 y.o.   MRN: [id number]  No chief complaint on file.   HPI Patient is in today for hospital follow-up.  Patient was not admitted, she went to emergency department on 08/11/2024.  She has had multiple falls over the past 2 months.  Last office visit at she had fallen in her home, she presents to today's office visit after going to the emergency room following a fall.    CT head negative/CT cervical spine negative  ROS      Objective:    There were no vitals taken for this visit. {Vitals History (Optional):23777}  Physical Exam Physical Exam Vitals and nursing note reviewed.  Constitutional:      General: She is not in acute distress.    Appearance: Normal appearance.  HENT:     Head: Normocephalic and atraumatic.     Right Ear: Tympanic membrane, ear canal and external ear normal.     Left Ear: Tympanic membrane, ear canal and external ear normal.  Eyes:     General:        Right eye: No discharge.        Left eye: No discharge.  Cardiovascular:     Comments: Regular rate and rhythm.  S1/S2 are distinct without any evidence of murmur, rubs, or gallops.  Radial pulses are 2+ bilaterally.  Dorsalis pedis pulses are 2+ bilaterally.  No evidence of pedal edema. Pulmonary:     Comments: Clear to auscultation bilaterally.  Normal effort.  No respiratory distress.  No evidence of wheezes, rales, or rhonchi heard throughout. Abdominal:     General: Abdomen is flat. Bowel sounds are normal. There is no distension.     Tenderness: There is no abdominal tenderness. There is no guarding or rebound.  Musculoskeletal:        General: Normal range of motion.     Cervical back: Neck supple.  Skin:    General: Skin is warm and dry.     Findings: No rash.  Neurological:     General: No focal deficit present.     Mental Status: She is alert.     Comments: Cranial nerves II through XII  are intact.  5/5 strength to the upper and lower extremities.  Normal sensation to the upper and lower extremities.  Normal finger-nose without any evidence of dysmetria.  Speech is normal.  Answers all questions appropriately.  Psychiatric:        Mood and Affect: Mood normal.        Behavior: Behavior normal.      No results found for any visits on 08/18/24.      Assessment & Plan:   Problem List Items Addressed This Visit     Anemia   Anxiety and depression   Breast cancer, right breast (HCC)   Emphysema lung (HCC)   Falls   Mixed hyperlipidemia   Other Visit Diagnoses       Hospital discharge follow-up    -  Primary       No orders of the defined types were placed in this encounter.   No follow-ups on file.  Morene Cecilio L Axel Frisk, NP

## 2024-08-17 ENCOUNTER — Encounter: Payer: Self-pay | Admitting: Family Medicine

## 2024-08-18 ENCOUNTER — Inpatient Hospital Stay: Admitting: Student

## 2024-08-18 DIAGNOSIS — F32A Depression, unspecified: Secondary | ICD-10-CM

## 2024-08-18 DIAGNOSIS — R296 Repeated falls: Secondary | ICD-10-CM

## 2024-08-18 DIAGNOSIS — D649 Anemia, unspecified: Secondary | ICD-10-CM

## 2024-08-18 DIAGNOSIS — Z09 Encounter for follow-up examination after completed treatment for conditions other than malignant neoplasm: Secondary | ICD-10-CM

## 2024-08-18 DIAGNOSIS — J439 Emphysema, unspecified: Secondary | ICD-10-CM

## 2024-08-18 DIAGNOSIS — E782 Mixed hyperlipidemia: Secondary | ICD-10-CM

## 2024-08-18 DIAGNOSIS — C50811 Malignant neoplasm of overlapping sites of right female breast: Secondary | ICD-10-CM

## 2024-08-18 NOTE — Progress Notes (Unsigned)
 No chief complaint on file.   Anne [name] is 83 y.o. female here for complaint of diarrhea.  What happened with gastroenterology refferal????  DIARRHEA Onset: {days/wks/mos/yrs:310907}   Number of bowel movements a day : ***   Color : {Desc; color stool:12259} /Consistency: {Desc; consistency stool:30030} Present status: {BETTER/WORSE:18031}   Weight loss:  {yes/no:20286}  If yes, how many pounds: *** Abdominal pain/cramping: {yes/no:20286}  Bloating:  {yes/no:20286} /Gas:  {yes/no:20286} Vomiting: {YES/NO/WILD CARDS:18581} /Abdominal Pain: {YES/NO/WILD CARDS:18581}  Weight loss: {YES/NO/WILD CARDS:18581}  Antibiotic use in past 30 days: {yes/no:20286} If yes, what antibiotic: ***  Have you had any symptoms like this in the past: {yes/no:20286}  Associated with certain foods: {yes/no:20286}   If yes, what foods: *** Associated with stress: {yes/no:20286}  Suspicious food or water: {YES/NO/WILD CARDS:18581}  Change in diet: {YES/NO/WILD CARDS:18581}  Artificial sugars/sodas/chewing gum:  {yes/no:20286}  Any household members with similar symptoms: {yes/no:20286}  History of GI disorder:  {yes/no:20286}   If yes, what was the diagnosis: *** History of GI surgery:  {yes/no:20286}   If yes, what procedure and when: *** Prior colonoscopy:  {yes/no:20286}   Therapies tried: ***  Past Medical History:  Diagnosis Date   ALKALINE PHOSPHATASE, ELEVATED 03/15/2009   Allergic state 06/10/2012   Allergy     Anemia    Anxiety and depression 04/28/2011   Arthritis    Asthma    Atypical chest pain 11/30/2011   AVM (arteriovenous malformation) of colon 2011   cecum   Baker's cyst of knee 05/22/2011   Cancer (HCC) 11/2006   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 Carotid duplex 03/2014: stable 1-39% BICA, f/u due 03/2016.   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     Clotting  disorder    COPD (chronic obstructive pulmonary disease) (HCC)    Dementia (HCC)    Dermatitis 11/23/2012   Dyspnea    EE (eosinophilic esophagitis)    Emphysema of lung (HCC)    Esophageal ring    ESOPHAGEAL STRICTURE 03/29/2009   Fall 11/23/2012   Family history of breast cancer    Family history of colon cancer    Family history of ovarian cancer    Family history of pancreatic cancer    Folliculitis of nose 12/31/2011   GERD (gastroesophageal reflux disease) 09/29/2009   improved s/p cholecystectomy and esophagus dilatation   History of chicken pox    History of measles    History of shingles    2 episodes   Hx of echocardiogram    a. Echo 01/2013: mild LVH, EF 55-60%, normal wall motion, Gr 1 diast dysfn   Hyperlipidemia    Hypertension    Knee pain, bilateral 07/23/2011   Medicare annual wellness visit, subsequent 06/19/2015   Mixed hyperlipidemia 10/17/2010   Qualifier: Diagnosis of  By: Edsel Mems     Orthostasis    Osteopenia 03/14/2011   Osteoporosis    Personal history of chemotherapy 2001   Personal history of radiation therapy 2001   rt breast   PERSONAL HX BREAST CANCER 09/29/2009   Pneumonia    PVC's (premature ventricular contractions)    a. Event monitor 01/2013: NSR, extensive PVCs.   Radial neck fracture 10/2011   minimally displaced   Substance abuse (HCC)    In remission 3 years.    Urinary incontinence 03/19/2012   Vaginitis 05/22/2011    There were no  vitals taken for this visit. Gen: awake, alert, appearing stated age HEENT: MMM Heart: RRR, no LE edema Lungs: CTAB, no accessory muscle use Abd: BS+, soft, TTP, non-distended, no masses or organomegaly Psych: Age appropriate judgment and insight  Irritable bowel syndrome, unspecified type  Encouraged adequate hydration with water and electrolyte replacement. Warning signs and symptoms verbalized and written down in AVS.  F/u prn. The patient voiced understanding and agreement to the  plan.  Anne LITTIE Jolly, DNP, AGNP-C 08/18/24 11:47 AM

## 2024-08-19 ENCOUNTER — Other Ambulatory Visit (HOSPITAL_BASED_OUTPATIENT_CLINIC_OR_DEPARTMENT_OTHER): Payer: Self-pay

## 2024-08-19 ENCOUNTER — Ambulatory Visit (INDEPENDENT_AMBULATORY_CARE_PROVIDER_SITE_OTHER): Admitting: Student

## 2024-08-19 VITALS — BP 102/70 | HR 87 | Temp 98.1°F | Resp 18 | Ht 65.0 in | Wt 130.2 lb

## 2024-08-19 DIAGNOSIS — Z7189 Other specified counseling: Secondary | ICD-10-CM | POA: Diagnosis not present

## 2024-08-19 DIAGNOSIS — R197 Diarrhea, unspecified: Secondary | ICD-10-CM | POA: Diagnosis not present

## 2024-08-19 DIAGNOSIS — C50911 Malignant neoplasm of unspecified site of right female breast: Secondary | ICD-10-CM | POA: Diagnosis not present

## 2024-08-19 DIAGNOSIS — C792 Secondary malignant neoplasm of skin: Secondary | ICD-10-CM | POA: Diagnosis not present

## 2024-08-19 DIAGNOSIS — K589 Irritable bowel syndrome without diarrhea: Secondary | ICD-10-CM | POA: Diagnosis not present

## 2024-08-19 MED ORDER — FLUZONE HIGH-DOSE 0.5 ML IM SUSY
0.5000 mL | PREFILLED_SYRINGE | Freq: Once | INTRAMUSCULAR | 0 refills | Status: AC
  Filled 2024-08-19: qty 0.5, 1d supply, fill #0

## 2024-08-19 MED ORDER — BENEFIBER PO POWD: 4.0000 g | Freq: Two times a day (BID) | ORAL | 0 refills | Status: DC

## 2024-08-25 NOTE — Telephone Encounter (Signed)
 With other medical issues, I would stay off of atorvastatin  for now  WIll follow lipids over time

## 2024-09-01 DIAGNOSIS — R0602 Shortness of breath: Secondary | ICD-10-CM | POA: Diagnosis not present

## 2024-09-01 DIAGNOSIS — C50911 Malignant neoplasm of unspecified site of right female breast: Secondary | ICD-10-CM | POA: Diagnosis not present

## 2024-09-01 DIAGNOSIS — C792 Secondary malignant neoplasm of skin: Secondary | ICD-10-CM | POA: Diagnosis not present

## 2024-09-01 DIAGNOSIS — N644 Mastodynia: Secondary | ICD-10-CM | POA: Diagnosis not present

## 2024-09-01 DIAGNOSIS — R945 Abnormal results of liver function studies: Secondary | ICD-10-CM | POA: Diagnosis not present

## 2024-09-01 DIAGNOSIS — M8580 Other specified disorders of bone density and structure, unspecified site: Secondary | ICD-10-CM | POA: Diagnosis not present

## 2024-09-03 ENCOUNTER — Other Ambulatory Visit (HOSPITAL_BASED_OUTPATIENT_CLINIC_OR_DEPARTMENT_OTHER): Payer: Self-pay

## 2024-09-03 MED ORDER — COMIRNATY 30 MCG/0.3ML IM SUSY
0.3000 mL | PREFILLED_SYRINGE | Freq: Once | INTRAMUSCULAR | 0 refills | Status: DC
  Filled 2024-09-03: qty 0.3, 1d supply, fill #0

## 2024-09-03 NOTE — Progress Notes (Unsigned)
 Cardiology Office Note   Date:  09/04/2024   ID:  Anne [name], Anne [name] 02/23/1941, MRN [id number]  PCP:  Anne [name] LABOR, MD  Cardiologist:   Anne Gull, MD   Pt presents for f/u of dizziness and palpitations.    History of Present Illness: Anne [name] is a 83 y.o. female with a history of orthostatic intolerance, carotid stenosis, HL, metastatic breast CA, COPD, anemia, GERD, eosinophilic esophagitis,  Echo 3/14: Mild LVH, EF 55-60%, grade 1 diastolic dysfunction. Event monitor 01/2013: NSR, extensive PVC, CV dz, palpitations.     Myoview 4/14: Low risk, no scar or ischemia, not gated.  She has been treated with low dose metoprolol  for PVCs and palps.    Cortisol low at one point   Seen in endocrine   Normal on last check   I saw the pt in Jan 2025   The pt is followed at [facility] for breast cancer    SE diarrhea    None now  She is dizzy, especially with standing   Has not passed out    No major injory but has fallen    Denies CP  Breathing is OK  No palpitations   Current Meds  Medication Sig   acetaminophen  (TYLENOL ) 500 MG tablet Take 500-1,000 mg by mouth every 6 (six) hours as needed for moderate pain.   amitriptyline  (ELAVIL ) 25 MG tablet Take 4 tablets (100 mg total) by mouth at bedtime.   aspirin  81 MG EC tablet Take 81 mg by mouth daily.   Biotin 5 MG CAPS Take 5 mg by mouth daily.   Brimonidine Tartrate (LUMIFY) 0.025 % SOLN Place 1 drop into both eyes daily.   Cholecalciferol (VITAMIN D3) 1000 UNITS CAPS Take 2,000 Units by mouth daily.   Cyanocobalamin  (VITAMIN B 12 PO) Take 2 tablets by mouth daily.   diltiazem  (CARDIZEM ) 30 MG tablet As needed   FLUoxetine  (PROZAC ) 20 MG capsule Take 3 capsules (60 mg total) by mouth daily.   Folic Acid  (FOLATE PO) Take 1 tablet by mouth daily.   LORazepam  (ATIVAN ) 1 MG tablet TAKE 1 TABLET(1 MG) BY MOUTH EVERY 8 HOURS AS NEEDED FOR ANXIETY   pantoprazole  (PROTONIX ) 40 MG tablet TAKE 1 TABLET(40 MG) BY MOUTH TWICE DAILY  BEFORE A MEAL   Pyridoxine HCl (B-6 PO) Take 1 tablet by mouth daily.   Thiamine  HCl (B-1 PO) Take 1 tablet by mouth daily.     Allergies:   Sorbitol; Tomato; Zofran ; Advil  [ibuprofen ]; Cephalexin ; Diphenhydramine  hcl; Morphine  and codeine; Tramadol ; Antihistamines, chlorpheniramine-type; and Oxycodone    Past Medical History:  Diagnosis Date   ALKALINE PHOSPHATASE, ELEVATED 03/15/2009   Allergic state 06/10/2012   Allergy     Anemia    Anxiety and depression 04/28/2011   Arthritis    Asthma    Atypical chest pain 11/30/2011   AVM (arteriovenous malformation) of colon 2011   cecum   Baker's cyst of knee 05/22/2011   Cancer (HCC) 11/2006   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 Carotid duplex 03/2014: stable 1-39% BICA, f/u due 03/2016.   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     Clotting disorder    COPD (chronic obstructive pulmonary disease) (HCC)    Dementia (HCC)    Dermatitis 11/23/2012   Dyspnea    EE (eosinophilic esophagitis)    Emphysema of lung (HCC)  Esophageal ring    ESOPHAGEAL STRICTURE 03/29/2009   Fall 11/23/2012   Family history of breast cancer    Family history of colon cancer    Family history of ovarian cancer    Family history of pancreatic cancer    Folliculitis of nose 12/31/2011   GERD (gastroesophageal reflux disease) 09/29/2009   improved s/p cholecystectomy and esophagus dilatation   History of chicken pox    History of measles    History of shingles    2 episodes   Hx of echocardiogram    a. Echo 01/2013: mild LVH, EF 55-60%, normal wall motion, Gr 1 diast dysfn   Hyperlipidemia    Hypertension    Knee pain, bilateral 07/23/2011   Medicare annual wellness visit, subsequent 06/19/2015   Mixed hyperlipidemia 10/17/2010   Qualifier: Diagnosis of  By: Edsel Mems     Orthostasis    Osteopenia 03/14/2011   Osteoporosis     Personal history of chemotherapy 2001   Personal history of radiation therapy 2001   rt breast   PERSONAL HX BREAST CANCER 09/29/2009   Pneumonia    PVC's (premature ventricular contractions)    a. Event monitor 01/2013: NSR, extensive PVCs.   Radial neck fracture 10/2011   minimally displaced   Substance abuse (HCC)    In remission 3 years.    Urinary incontinence 03/19/2012   Vaginitis 05/22/2011    Past Surgical History:  Procedure Laterality Date   APPENDECTOMY     AUGMENTATION MAMMAPLASTY Right 05/27/2007   BREAST BIOPSY Right 01/02/2007   wire loc   BREAST BIOPSY  12/26/2006   BREAST LUMPECTOMY Right 2001   BREAST RECONSTRUCTION  2008, 2009, 2010   BREAST REDUCTION WITH MASTOPEXY Left 05/30/2017   Procedure: LEFT BREAST REDUCTION FOR SYMTERY WITH MASTOPEXY;  Surgeon: Lowery Estefana RAMAN, DO;  Location: [facility];  Service: Plastics;  Laterality: Left;   CHOLECYSTECTOMY  2010   COLONOSCOPY  09/05/10   cecal avm's   DENTAL SURGERY  05/2016   4 dental implants by Dr. Dorthula.   ERCP  2010    CBD stone extraction    ESOPHAGOGASTRODUODENOSCOPY  01/08/2012   Procedure: ESOPHAGOGASTRODUODENOSCOPY (EGD);  Surgeon: Lupita FORBES Commander, MD;  Location: [facility];  Service: Endoscopy;  Laterality: N/A;   ESOPHAGOGASTRODUODENOSCOPY (EGD) WITH ESOPHAGEAL DILATION  2010, 2012   LAPAROSCOPIC APPENDECTOMY N/A 08/04/2016   Procedure: APPENDECTOMY LAPAROSCOPIC;  Surgeon: Elspeth Schultze, MD;  Location: [facility];  Service: General;  Laterality: N/A;   LIPOSUCTION WITH LIPOFILLING Left 11/21/2017   Procedure: LIPOSUCTION FROM ABDOMEN WITH LIPOFILLING TO LEFT BREAST;  Surgeon: Lowery Estefana RAMAN, DO;  Location: [facility];  Service: Plastics;  Laterality: Left;   MASTECTOMY MODIFIED RADICAL Right 05/27/2007   , Mastectomy modified radical (08), breast reconstruction, CA lesions excised lateral abd wall 2010   MASTOPEXY Left 11/21/2017   Procedure: LEFT BREAST REVISION  MASTOPEXY FOR SYMMETRY;  Surgeon: Lowery Estefana RAMAN, DO;  Location: [facility];  Service: Plastics;  Laterality: Left;   REDUCTION MAMMAPLASTY Left    SAVORY DILATION  01/08/2012   Procedure: SAVORY DILATION;  Surgeon: Lupita FORBES Commander, MD;  Location: [facility];  Service: Endoscopy;  Laterality: N/A;  need xray     Social History:  The patient  reports that she quit smoking about 17 years ago. Her smoking use included cigarettes. She started smoking about 67 years ago. She has a 50 pack-year smoking history. She has never used smokeless tobacco.  She reports that she does not currently use alcohol. She reports that she does not use drugs.   Family History:  The patient's family history includes Alcohol abuse in her maternal grandfather; Anxiety disorder in her sister; Arthritis in her sister; Breast cancer in her cousin, cousin, maternal aunt, maternal aunt, maternal aunt, and paternal aunt; Cancer in her maternal uncle; Cirrhosis in her sister; Esophageal cancer in her paternal grandfather; Heart disease in her father and paternal grandfather; Lung cancer in her father and paternal aunt; Osteoporosis in her sister and sister; Other in her mother; Ovarian cancer (age of onset: 30) in her maternal aunt; Pancreatic cancer in her maternal aunt and maternal uncle; Rectal cancer in her paternal grandfather; Skin cancer in her sister; Stomach cancer in her maternal uncle; Stroke in her maternal grandmother.    ROS:  Please see the history of present illness. All other systems are reviewed and  Negative to the above problem except as noted.    PHYSICAL EXAM: VS:  BP 101/65   Pulse 91   Ht 5' 5.5 (1.664 m)   Wt 131 lb 6.4 oz (59.6 kg)   SpO2 93%   BMI 21.53 kg/m    BP laying 115/76  P 92  Sitting   101/65  P 91   Standing 2 min 89/57  P 94  Standing 4 min 85/56  P 97  Fet dizzy with sitting and standing  GEN:  Thin 83 yo  in no acute distress  HEENT: normal  Neck: no JVD, no  carotid bruits Cardiac: RRR; no murmurs Respiratory:  clear to auscultation  GI: soft, nontender No hepatomegaly  Ext without edema   EKG:  EKG is not ordered today.   Echo  2021  1. Left ventricular ejection fraction, by estimation, is 60 to 65%. The  left ventricle has normal function. The left ventricle has no regional  wall motion abnormalities. Left ventricular diastolic parameters are  consistent with Grade I diastolic  dysfunction (impaired relaxation). The average left ventricular global  longitudinal strain is -19.8 %. The global longitudinal strain is normal.   2. Right ventricular systolic function is normal. The right ventricular  size is normal.   3. The mitral valve is normal in structure. Trivial mitral valve  regurgitation. No evidence of mitral stenosis.   4. The aortic valve is normal in structure. Aortic valve regurgitation is  not visualized. No aortic stenosis is present.   5. The inferior vena cava is normal in size with greater than 50%  respiratory variability, suggesting right atrial pressure of 3 mmHg.   Comparison(s): 02/28/18 EF 55-60%.   Carotid USN   2017  Heterogeneous plaque, bilaterally. Stable 1-39% bilateral ICA stenosis. Normal subclavian arteries, bilaterally. Patent vertebral arteries with antegrade flow. f/u PRN. Lipid Panel    Component Value Date/Time   CHOL 143 04/04/2023 1613   TRIG 166.0 (H) 04/04/2023 1613   HDL 65.70 04/04/2023 1613   CHOLHDL 2 04/04/2023 1613   VLDL 33.2 04/04/2023 1613   LDLCALC 44 04/04/2023 1613   LDLDIRECT 117.4 01/13/2013 1210      Wt Readings from Last 3 Encounters:  09/04/24 131 lb 6.4 oz (59.6 kg)  08/19/24 130 lb 3.2 oz (59.1 kg)  07/30/24 133 lb (60.3 kg)      ASSESSMENT AND PLAN:  1  Orthostatic hypotension  Pt remains orthostatic   I saw her in lobby earlier and she seemed a little dazed . She has fallen   I  continue to encourage fluids   She admits to not drinking much Given falls I  would add low dose midodrine 2.5 mg 3x per day (8/11/2) Follow up in 1 month  2 Hx palpitations   Pt without complaints  3  CV dz  Off of statin with concurrent chemotherapy  Will need to review    4  HL  Will check lipids on return   5  Hx Breast CA  Follows in oncology at [facility]  Current medicines are reviewed at length with the patient today.  The patient does not have concerns regarding medicines.  Signed, Anne Gull, MD  09/04/2024 2:04 PM    [facility] [address] [place], [place], [place]  [address] Phone: [phone]; Fax: [phone]

## 2024-09-04 ENCOUNTER — Ambulatory Visit: Attending: Internal Medicine | Admitting: Internal Medicine

## 2024-09-04 ENCOUNTER — Encounter: Payer: Self-pay | Admitting: Internal Medicine

## 2024-09-04 ENCOUNTER — Other Ambulatory Visit: Payer: Self-pay | Admitting: Family Medicine

## 2024-09-04 VITALS — BP 101/65 | HR 91 | Ht 65.5 in | Wt 131.4 lb

## 2024-09-04 DIAGNOSIS — I951 Orthostatic hypotension: Secondary | ICD-10-CM

## 2024-09-04 MED ORDER — MIDODRINE HCL 2.5 MG PO TABS: 2.5000 mg | ORAL_TABLET | Freq: Three times a day (TID) | ORAL | 3 refills | Status: AC

## 2024-09-04 NOTE — Patient Instructions (Addendum)
 Medication Instructions:  Your physician has recommended you make the following change in your medication:   1) START midodrine (Proamatrine) 2.5 mg three times daily (1st dose upon waking around 7:00 or 8:00 AM, 2nd dose at 11:00 AM or 12:00 PM, 3rd dose at 2:00 or 3:00 PM)  *If you need a refill on your cardiac medications before your next appointment, please call your pharmacy*  Follow-Up: At [facility], you and your health needs are our priority.  As part of our continuing mission to provide you with exceptional heart care, our providers are all part of one team.  This team includes your primary Cardiologist (physician) and Advanced Practice Providers or APPs (Physician Assistants and Nurse Practitioners) who all work together to provide you with the care you need, when you need it.  Your next appointment:   4-6 week(s)  Provider:   Vina Gull, MD   We recommend signing up for the patient portal called MyChart.  Sign up information is provided on this After Visit Summary.  MyChart is used to connect with patients for Virtual Visits (Telemedicine).  Patients are able to view lab/test results, encounter notes, upcoming appointments, etc.  Non-urgent messages can be sent to your provider as well.    To learn more about what you can do with MyChart, go to [email or web address].   Other Instructions Please increase your water intake with a daily goal of 65 oz.

## 2024-09-04 NOTE — Telephone Encounter (Signed)
 Copied from CRM [id number]. Topic: Clinical - Medication Refill >> Sep 04, 2024  5:19 PM Anne [name] wrote: Medication: amitriptyline  (ELAVIL ) 25 MG tablet  Has the patient contacted their pharmacy? Yes (Agent: If no, request that the patient contact the pharmacy for the refill. If patient does not wish to contact the pharmacy document the reason why and proceed with request.) (Agent: If yes, when and what did the pharmacy advise?) Pharmacy calling for refill.  This is the patient's preferred pharmacy:  [id number] - [place], [place] - [address] AT [facility] [address] [place] [address] Phone: [phone] Fax: [phone]  Is this the correct pharmacy for this prescription? Yes If no, delete pharmacy and type the correct one.   Has the prescription been filled recently? No  Is the patient out of the medication? Yes  Has the patient been seen for an appointment in the last year OR does the patient have an upcoming appointment? Yes  Can we respond through MyChart? Yes  Agent: Please be advised that Rx refills may take up to 3 business days. We ask that you follow-up with your pharmacy.

## 2024-09-07 MED ORDER — AMITRIPTYLINE HCL 25 MG PO TABS: 100.0000 mg | ORAL_TABLET | Freq: Every day | ORAL | 1 refills | Status: AC

## 2024-09-09 ENCOUNTER — Encounter: Payer: Self-pay | Admitting: Student

## 2024-09-13 NOTE — Assessment & Plan Note (Deleted)
 [id number] acceptable, minimize simple carbs. Increase exercise as tolerated.

## 2024-09-13 NOTE — Progress Notes (Deleted)
 Subjective:    Patient ID: Anne [name], female    DOB: [birth year], 83 y.o.   MRN: [id number]  No chief complaint on file.   HPI Discussed the use of AI scribe software for clinical note transcription with the patient, who gave verbal consent to proceed.  History of Present Illness     Past Medical History:  Diagnosis Date  . ALKALINE PHOSPHATASE, ELEVATED 03/15/2009  . Allergic state 06/10/2012  . Allergy    . Anemia   . Anxiety and depression 04/28/2011  . Arthritis   . Asthma   . Atypical chest pain 11/30/2011  . 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 Baker's cyst of knee 05/22/2011  . Cancer (HCC) 11/2006   XRT/chemo 01-02/ lobular invasive ca  . Carotid artery disease    a. Carotid duplex 03/2014: stable 1-39% BICA, f/u due 03/2016.  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    . Clotting disorder   . COPD (chronic obstructive pulmonary disease) (HCC)   . Dementia (HCC)   . Dermatitis 11/23/2012  . Dyspnea   . EE (eosinophilic esophagitis)   . Emphysema of lung (HCC)   . Esophageal ring   . ESOPHAGEAL STRICTURE 03/29/2009  . Fall 11/23/2012  . Family history of breast cancer   . Family history of colon cancer   . Family history of ovarian cancer   . Family history of pancreatic cancer   . Folliculitis of nose 12/31/2011  . GERD (gastroesophageal reflux disease) 09/29/2009   improved s/p cholecystectomy and esophagus dilatation  . History of chicken pox   . History of measles   . History of shingles    2 episodes  . Hx of echocardiogram    a. Echo 01/2013: mild LVH, EF 55-60%, normal wall motion, Gr 1 diast dysfn  . Hyperlipidemia   . Hypertension   . Knee pain, bilateral 07/23/2011  . Medicare annual wellness visit, subsequent 06/19/2015  . Mixed hyperlipidemia 10/17/2010   Qualifier: Diagnosis of  By: Edsel Mems    . Orthostasis   . Osteopenia  03/14/2011  . Osteoporosis   . Personal history of chemotherapy 2001  . Personal history of radiation therapy 2001   rt breast  . 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 Pneumonia   . PVC's (premature ventricular contractions)    a. Event monitor 01/2013: NSR, extensive PVCs.  . Radial neck fracture 10/2011   minimally displaced  . Substance abuse (HCC)    In remission 3 years.   . Urinary incontinence 03/19/2012  . Vaginitis 05/22/2011    Past Surgical History:  Procedure Laterality Date  . APPENDECTOMY    . AUGMENTATION MAMMAPLASTY Right 05/27/2007  . BREAST BIOPSY Right 01/02/2007   wire loc  . BREAST BIOPSY  12/26/2006  . BREAST LUMPECTOMY Right 2001  . BREAST RECONSTRUCTION  2008, 2009, 2010  . BREAST REDUCTION WITH MASTOPEXY Left 05/30/2017   Procedure: LEFT BREAST REDUCTION FOR SYMTERY WITH MASTOPEXY;  Surgeon: Lowery Estefana RAMAN, DO;  Location: [facility];  Service: Plastics;  Laterality: Left;  . CHOLECYSTECTOMY  2010  . COLONOSCOPY  09/05/10   cecal avm's  . DENTAL SURGERY  05/2016   4 dental implants by Dr. Mohorn.  SABRA ERCP  2010    CBD stone extraction   . ESOPHAGOGASTRODUODENOSCOPY  01/08/2012   Procedure: ESOPHAGOGASTRODUODENOSCOPY (EGD);  Surgeon: Lupita FORBES Commander, MD;  Location: [facility];  Service: Endoscopy;  Laterality: N/A;  . ESOPHAGOGASTRODUODENOSCOPY (EGD) WITH ESOPHAGEAL DILATION  2010, 2012  . LAPAROSCOPIC APPENDECTOMY N/A 08/04/2016   Procedure: APPENDECTOMY LAPAROSCOPIC;  Surgeon: Elspeth Schultze, MD;  Location: [facility];  Service: General;  Laterality: N/A;  . LIPOSUCTION WITH LIPOFILLING Left 11/21/2017   Procedure: LIPOSUCTION FROM ABDOMEN WITH LIPOFILLING TO LEFT BREAST;  Surgeon: Lowery Estefana RAMAN, DO;  Location: [facility];  Service: Plastics;  Laterality: Left;  SABRA MASTECTOMY MODIFIED RADICAL Right 05/27/2007   , Mastectomy modified radical (08), breast reconstruction, CA lesions excised lateral abd wall 2010  .  MASTOPEXY Left 11/21/2017   Procedure: LEFT BREAST REVISION MASTOPEXY FOR SYMMETRY;  Surgeon: Lowery Estefana RAMAN, DO;  Location: [facility];  Service: Plastics;  Laterality: Left;  . REDUCTION MAMMAPLASTY Left   . SAVORY DILATION  01/08/2012   Procedure: SAVORY DILATION;  Surgeon: Lupita FORBES Commander, MD;  Location: [facility];  Service: Endoscopy;  Laterality: N/A;  need xray    Family History  Problem Relation Age of Onset  . Other Mother        tic [place]  . Heart disease Father   . Lung cancer Father        smoker  . Cirrhosis Sister        Primary Biliary  . Anxiety disorder Sister   . Osteoporosis Sister   . Arthritis Sister        Rheumatoid  . Osteoporosis Sister   . Skin cancer Sister        multiple skin cancers, over 90 excisions.  . Stroke Maternal Grandmother   . Alcohol abuse Maternal Grandfather   . Heart disease Paternal Grandfather   . Esophageal cancer Paternal Grandfather   . Rectal cancer Paternal Grandfather   . Breast cancer Maternal Aunt        dx in her 20s  . Breast cancer Maternal Aunt        dx in her 83s  . Breast cancer Maternal Aunt        possible breast cancer dx and died in her 39s  . Ovarian cancer Maternal Aunt 29  . Pancreatic cancer Maternal Aunt        dx in her 43s  . Pancreatic cancer Maternal Uncle        dx in his 47s; smoker  . Stomach cancer Maternal Uncle   . Cancer Maternal Uncle   . Breast cancer Paternal Aunt        dx in her 39s  . Lung cancer Paternal Aunt   . Breast cancer Cousin        paternal first cousin  . Breast cancer Cousin        maternal first cousin  . Anesthesia problems Neg Hx   . Hypotension Neg Hx   . Malignant hyperthermia Neg Hx   . Pseudochol deficiency Neg Hx     Social History   Socioeconomic History  . Marital status: Married    Spouse name: Sam  . Number of children: 3  . Years of education: Not on file  . Highest education level: Some college, no degree  Occupational  History  . Occupation: arboriculturist  Tobacco Use  . Smoking status: Former    Current packs/day: 0.00    Average packs/day: 1 pack/day for 50.0 years (50.0 ttl pk-yrs)    Types: Cigarettes    Start date: 05/21/1957    Quit date: 05/22/2007    Years  since quitting: 17.3  . Smokeless tobacco: Never  Vaping Use  . Vaping status: Never Used  Substance and Sexual Activity  . Alcohol use: Not Currently  . Drug use: No  . Sexual activity: Yes    Partners: Male    Comment: lives with husband,   Other Topics Concern  . Not on file  Social History Narrative  . Not on file   Social Drivers of Health   Financial Resource Strain: Medium Risk (08/19/2024)   Overall Financial Resource Strain (CARDIA)   . Difficulty of Paying Living Expenses: Somewhat hard  Food Insecurity: No Food Insecurity (08/19/2024)   Hunger Vital Sign   . Worried About Programme Researcher, Broadcasting/film/video in the Last Year: Never true   . Ran Out of Food in the Last Year: Never true  Transportation Needs: No Transportation Needs (08/19/2024)   PRAPARE - Transportation   . Lack of Transportation (Medical): No   . Lack of Transportation (Non-Medical): No  Physical Activity: Sufficiently Active (08/19/2024)   Exercise Vital Sign   . Days of Exercise per Week: 6 days   . Minutes of Exercise per Session: 30 min  Stress: Stress Concern Present (08/19/2024)   [employer] of Occupational Health - Occupational Stress Questionnaire   . Feeling of Stress: To some extent  Social Connections: Unknown (08/19/2024)   Social Connection and Isolation Panel   . Frequency of Communication with Friends and Family: Twice a week   . Frequency of Social Gatherings with Friends and Family: Never   . Attends Religious Services: 1 to 4 times per year   . Active Member of Clubs or Organizations: No   . Attends Banker Meetings: Not on file   . Marital Status: Not on file  Intimate Partner Violence: Not At Risk (07/30/2024)   Humiliation,  Afraid, Rape, and Kick questionnaire   . Fear of Current or Ex-Partner: No   . Emotionally Abused: No   . Physically Abused: No   . Sexually Abused: No    Outpatient Medications Prior to Visit  Medication Sig Dispense Refill  . acetaminophen  (TYLENOL ) 500 MG tablet Take 500-1,000 mg by mouth every 6 (six) hours as needed for moderate pain.    . amitriptyline  (ELAVIL ) 25 MG tablet Take 4 tablets (100 mg total) by mouth at bedtime. 360 tablet 1  . aspirin  81 MG EC tablet Take 81 mg by mouth daily.    . Biotin 5 MG CAPS Take 5 mg by mouth daily.    . Brimonidine Tartrate (LUMIFY) 0.025 % SOLN Place 1 drop into both eyes daily.    . Cholecalciferol (VITAMIN D3) 1000 UNITS CAPS Take 2,000 Units by mouth daily.    . Cyanocobalamin  (VITAMIN B 12 PO) Take 2 tablets by mouth daily.    . diltiazem  (CARDIZEM ) 30 MG tablet As needed 90 tablet 1  . FLUoxetine  (PROZAC ) 20 MG capsule Take 3 capsules (60 mg total) by mouth daily. 90 capsule 3  . Folic Acid  (FOLATE PO) Take 1 tablet by mouth daily.    . LORazepam  (ATIVAN ) 1 MG tablet TAKE 1 TABLET(1 MG) BY MOUTH EVERY 8 HOURS AS NEEDED FOR ANXIETY 90 tablet 1  . midodrine (PROAMATINE) 2.5 MG tablet Take 1 tablet (2.5 mg total) by mouth 3 (three) times daily with meals. (1st dose upon waking around 7:00 or 8:00 AM, 2nd dose at 11:00 AM or 12:00 PM, 3rd dose at 2:00 or 3:00 PM) 270 tablet 3  . pantoprazole  (PROTONIX )  40 MG tablet TAKE 1 TABLET(40 MG) BY MOUTH TWICE DAILY BEFORE A MEAL 180 tablet 0  . Pyridoxine HCl (B-6 PO) Take 1 tablet by mouth daily.    . Thiamine  HCl (B-1 PO) Take 1 tablet by mouth daily.     No facility-administered medications prior to visit.    Allergies  Allergen Reactions  . Sorbitol Other (See Comments)    GI Issues  . Tomato Diarrhea  . Zofran  Other (See Comments)    headache  . Advil  [Ibuprofen ] Other (See Comments)    Irritates throat.  . Cephalexin  Other (See Comments)    dizziness  . Diphenhydramine  Hcl Other (See  Comments)    nervous and upset does okay w/ cream  . Morphine  And Codeine Other (See Comments)    Causes depression  . Tramadol  Other (See Comments)    Dizziness  . Antihistamines, Chlorpheniramine-Type Anxiety  . Oxycodone  Anxiety    Confusion with inability to think clearly    Review of Systems  Constitutional:  Negative for fever and malaise/fatigue.  HENT:  Negative for congestion.   Eyes:  Negative for blurred vision.  Respiratory:  Negative for shortness of breath.   Cardiovascular:  Negative for chest pain, palpitations and leg swelling.  Gastrointestinal:  Negative for abdominal pain, blood in stool and nausea.  Genitourinary:  Negative for dysuria and frequency.  Musculoskeletal:  Negative for falls.  Skin:  Negative for rash.  Neurological:  Negative for dizziness, loss of consciousness and headaches.  Endo/Heme/Allergies:  Negative for environmental allergies.  Psychiatric/Behavioral:  Negative for depression. The patient is not nervous/anxious.        Objective:    Physical Exam Constitutional:      General: She is not in acute distress.    Appearance: Normal appearance. She is well-developed. She is not toxic-appearing.  HENT:     Head: Normocephalic and atraumatic.     Right Ear: External ear normal.     Left Ear: External ear normal.     Nose: Nose normal.  Eyes:     General:        Right eye: No discharge.        Left eye: No discharge.     Conjunctiva/sclera: Conjunctivae normal.  Neck:     Thyroid : No thyromegaly.  Cardiovascular:     Rate and Rhythm: Normal rate and regular rhythm.     Heart sounds: Normal heart sounds. No murmur heard. Pulmonary:     Effort: Pulmonary effort is normal. No respiratory distress.     Breath sounds: Normal breath sounds.  Abdominal:     General: Bowel sounds are normal.     Palpations: Abdomen is soft.     Tenderness: There is no abdominal tenderness. There is no guarding.  Musculoskeletal:        General:  Normal range of motion.     Cervical back: Neck supple.  Lymphadenopathy:     Cervical: No cervical adenopathy.  Skin:    General: Skin is warm and dry.  Neurological:     Mental Status: She is alert and oriented to person, place, and time.  Psychiatric:        Mood and Affect: Mood normal.        Behavior: Behavior normal.        Thought Content: Thought content normal.        Judgment: Judgment normal.    There were no vitals taken for this visit. Wt Readings from Last 3 Encounters:  09/04/24 131 lb 6.4 oz (59.6 kg)  08/19/24 130 lb 3.2 oz (59.1 kg)  07/30/24 133 lb (60.3 kg)    Diabetic Foot Exam - Simple   No data filed    Lab Results  Component Value Date   WBC 5.0 08/11/2024   HGB 12.1 08/11/2024   HCT 36.6 08/11/2024   PLT 277 08/11/2024   GLUCOSE 100 (H) 08/11/2024   CHOL 143 04/04/2023   TRIG 166.0 (H) 04/04/2023   HDL 65.70 04/04/2023   LDLDIRECT 117.4 01/13/2013   LDLCALC 44 04/04/2023   ALT 11 08/11/2024   AST 19 08/11/2024   NA 140 08/11/2024   K 3.4 (L) 08/11/2024   CL 103 08/11/2024   CREATININE 0.65 08/11/2024   BUN 11 08/11/2024   CO2 26 08/11/2024   TSH 2.160 11/07/2023   INR 1.02 08/12/2017   HGBA1C 6.0 04/04/2023    Lab Results  Component Value Date   TSH 2.160 11/07/2023   Lab Results  Component Value Date   WBC 5.0 08/11/2024   HGB 12.1 08/11/2024   HCT 36.6 08/11/2024   MCV 99.7 08/11/2024   PLT 277 08/11/2024   Lab Results  Component Value Date   NA 140 08/11/2024   K 3.4 (L) 08/11/2024   CHLORIDE 104 09/04/2017   CO2 26 08/11/2024   GLUCOSE 100 (H) 08/11/2024   BUN 11 08/11/2024   CREATININE 0.65 08/11/2024   BILITOT 0.3 08/11/2024   ALKPHOS 75 08/11/2024   AST 19 08/11/2024   ALT 11 08/11/2024   PROT 7.3 08/11/2024   ALBUMIN 4.2 08/11/2024   CALCIUM  9.3 08/11/2024   ANIONGAP 11 08/11/2024   EGFR 88 11/07/2023   GFR 78.85 04/24/2023   Lab Results  Component Value Date   CHOL 143 04/04/2023   Lab Results   Component Value Date   HDL 65.70 04/04/2023   Lab Results  Component Value Date   LDLCALC 44 04/04/2023   Lab Results  Component Value Date   TRIG 166.0 (H) 04/04/2023   Lab Results  Component Value Date   CHOLHDL 2 04/04/2023   Lab Results  Component Value Date   HGBA1C 6.0 04/04/2023       Assessment & Plan:  Chronic alcoholism in remission [facility]) Assessment & Plan: Continues to abstain   Hyperglycemia Assessment & Plan: hgba1c acceptable, minimize simple carbs. Increase exercise as tolerated.    Mixed hyperlipidemia Assessment & Plan: Encourage heart healthy diet such as MIND or DASH diet, increase exercise, avoid trans fats, simple carbohydrates and processed foods, consider a krill or fish or flaxseed oil cap daily.     Mild intermittent asthma without complication Assessment & Plan: No recent exacerbation, no changes     Assessment and Plan Assessment & Plan      Harlene Horton, MD

## 2024-09-13 NOTE — Assessment & Plan Note (Deleted)
No recent exacerbation, no changes 

## 2024-09-13 NOTE — Assessment & Plan Note (Deleted)
Continues to abstain 

## 2024-09-13 NOTE — Assessment & Plan Note (Deleted)
 Encourage heart healthy diet such as MIND or DASH diet, increase exercise, avoid trans fats, simple carbohydrates and processed foods, consider a krill or fish or flaxseed oil cap daily.

## 2024-09-17 ENCOUNTER — Ambulatory Visit: Admitting: Family Medicine

## 2024-09-17 DIAGNOSIS — E782 Mixed hyperlipidemia: Secondary | ICD-10-CM

## 2024-09-17 DIAGNOSIS — R739 Hyperglycemia, unspecified: Secondary | ICD-10-CM

## 2024-09-17 DIAGNOSIS — F1021 Alcohol dependence, in remission: Secondary | ICD-10-CM

## 2024-09-17 DIAGNOSIS — J4520 Mild intermittent asthma, uncomplicated: Secondary | ICD-10-CM

## 2024-09-22 ENCOUNTER — Encounter: Payer: Self-pay | Admitting: Internal Medicine

## 2024-09-23 ENCOUNTER — Emergency Department (HOSPITAL_BASED_OUTPATIENT_CLINIC_OR_DEPARTMENT_OTHER)
Admission: EM | Admit: 2024-09-23 | Discharge: 2024-09-23 | Disposition: A | Discharge status: Home / Self Care | Attending: Emergency Medicine | Admitting: Emergency Medicine

## 2024-09-23 ENCOUNTER — Other Ambulatory Visit: Payer: Self-pay

## 2024-09-23 ENCOUNTER — Encounter (HOSPITAL_BASED_OUTPATIENT_CLINIC_OR_DEPARTMENT_OTHER): Payer: Self-pay | Admitting: Emergency Medicine

## 2024-09-23 DIAGNOSIS — R112 Nausea with vomiting, unspecified: Secondary | ICD-10-CM | POA: Diagnosis present

## 2024-09-23 DIAGNOSIS — E876 Hypokalemia: Secondary | ICD-10-CM | POA: Diagnosis not present

## 2024-09-23 DIAGNOSIS — Z853 Personal history of malignant neoplasm of breast: Secondary | ICD-10-CM | POA: Diagnosis not present

## 2024-09-23 DIAGNOSIS — Z7982 Long term (current) use of aspirin: Secondary | ICD-10-CM | POA: Insufficient documentation

## 2024-09-23 LAB — CBC WITH DIFFERENTIAL/PLATELET
Abs Immature Granulocytes: 0.03 K/uL (ref 0.00–0.07)
Basophils Absolute: 0 K/uL (ref 0.0–0.1)
Basophils Relative: 0 %
Eosinophils Absolute: 0.2 K/uL (ref 0.0–0.5)
Eosinophils Relative: 2 %
HCT: 36.3 % (ref 36.0–46.0)
Hemoglobin: 12 g/dL (ref 12.0–15.0)
Immature Granulocytes: 0 %
Lymphocytes Relative: 25 %
Lymphs Abs: 1.9 K/uL (ref 0.7–4.0)
MCH: 31.9 pg (ref 26.0–34.0)
MCHC: 33.1 g/dL (ref 30.0–36.0)
MCV: 96.5 fL (ref 80.0–100.0)
Monocytes Absolute: 0.6 K/uL (ref 0.1–1.0)
Monocytes Relative: 7 %
Neutro Abs: 5.2 K/uL (ref 1.7–7.7)
Neutrophils Relative %: 66 %
Platelets: 188 K/uL (ref 150–400)
RBC: 3.76 MIL/uL — ABNORMAL LOW (ref 3.87–5.11)
RDW: 12.9 % (ref 11.5–15.5)
WBC: 7.9 K/uL (ref 4.0–10.5)
nRBC: 0 % (ref 0.0–0.2)

## 2024-09-23 LAB — COMPREHENSIVE METABOLIC PANEL WITH GFR
ALT: 20 U/L (ref 0–44)
AST: 23 U/L (ref 15–41)
Albumin: 4.2 g/dL (ref 3.5–5.0)
Alkaline Phosphatase: 88 U/L (ref 38–126)
Anion gap: 13 (ref 5–15)
BUN: 12 mg/dL (ref 8–23)
CO2: 26 mmol/L (ref 22–32)
Calcium: 9 mg/dL (ref 8.9–10.3)
Chloride: 104 mmol/L (ref 98–111)
Creatinine, Ser: 0.6 mg/dL (ref 0.44–1.00)
GFR, Estimated: >60 mL/min (ref >60)
Glucose, Bld: 118 mg/dL — ABNORMAL HIGH (ref 70–99)
Potassium: 2.7 mmol/L — CL (ref 3.5–5.1)
Sodium: 143 mmol/L (ref 135–145)
Total Bilirubin: 0.3 mg/dL (ref 0.0–1.2)
Total Protein: 7.1 g/dL (ref 6.5–8.1)

## 2024-09-23 LAB — D-DIMER, QUANTITATIVE: D-Dimer, Quant: 0.71 ug{FEU}/mL — ABNORMAL HIGH (ref 0.00–0.50)

## 2024-09-23 LAB — MAGNESIUM: Magnesium: 1.7 mg/dL (ref 1.7–2.4)

## 2024-09-23 LAB — TROPONIN T, HIGH SENSITIVITY: Troponin T High Sensitivity: <15 ng/L (ref 0–19)

## 2024-09-23 LAB — LIPASE, BLOOD: Lipase: 23 U/L (ref 11–51)

## 2024-09-23 MED ORDER — POTASSIUM CHLORIDE 20 MEQ PO PACK
40.0000 meq | PACK | Freq: Once | ORAL | Status: AC
  Administered 2024-09-23: 40 meq via ORAL
  Filled 2024-09-23: qty 2

## 2024-09-23 MED ORDER — LACTATED RINGERS IV BOLUS: 1000.0000 mL | Freq: Once | INTRAVENOUS | Status: DC

## 2024-09-23 MED ORDER — POTASSIUM CHLORIDE 20 MEQ PO PACK: 40.0000 meq | PACK | Freq: Two times a day (BID) | ORAL | 0 refills | Status: AC

## 2024-09-23 MED ORDER — METOCLOPRAMIDE HCL 5 MG/ML IJ SOLN
5.0000 mg | Freq: Once | INTRAMUSCULAR | Status: DC
  Filled 2024-09-23: qty 2

## 2024-09-23 NOTE — ED Triage Notes (Signed)
 Pt is currently being treated for Cancer. Started new meds today. Developed emesis after second dose.

## 2024-09-23 NOTE — ED Provider Notes (Signed)
 [facility] Provider Note   CSN: [id number] Arrival date & time: 09/23/24  0154     Patient presents with: Emesis   Anne [name] is a 83 y.o. female.   83 yo currently on immunotherapy for breast cancer and started a new medication yesterday and has had multiple episodes of spitting up her saliva, nausea and vomiting over the last few hours. Husband is a retired teacher, early years/pre and notes that her elavil  is known to be synergistic with the immune medication. No fever or constipation. Has chronic diarrhea and that has persisted. No sick contacts or suspicious food intake. Had some tingling in her feet, also had some left arm discomfort with dyspnea earlier in the night. No cough.    Emesis      Prior to Admission medications   Medication Sig Start Date End Date Taking? Authorizing Provider  potassium chloride  (KLOR-CON ) 20 MEQ packet Take 40 mEq by mouth 2 (two) times daily for 7 days. 09/23/24 09/30/24 Yes Ayahna Solazzo, Selinda, MD  acetaminophen  (TYLENOL ) 500 MG tablet Take 500-1,000 mg by mouth every 6 (six) hours as needed for moderate pain.    [provider]  amitriptyline  (ELAVIL ) 25 MG tablet Take 4 tablets (100 mg total) by mouth at bedtime. 09/07/24   Domenica Harlene LABOR, MD  aspirin  81 MG EC tablet Take 81 mg by mouth daily.    [provider]  Biotin 5 MG CAPS Take 5 mg by mouth daily.    [provider]  Brimonidine Tartrate (LUMIFY) 0.025 % SOLN Place 1 drop into both eyes daily.    [provider]  Cholecalciferol (VITAMIN D3) 1000 UNITS CAPS Take 2,000 Units by mouth daily.    [provider]  Cyanocobalamin  (VITAMIN B 12 PO) Take 2 tablets by mouth daily.    [provider]  diltiazem  (CARDIZEM ) 30 MG tablet As needed 08/27/23   Johnson, Kathleen R, PA-C  FLUoxetine  (PROZAC ) 20 MG capsule Take 3 capsules (60 mg total) by mouth daily. 03/18/24   Domenica Harlene LABOR, MD  Folic Acid  (FOLATE PO)  Take 1 tablet by mouth daily.    [provider]  LORazepam  (ATIVAN ) 1 MG tablet TAKE 1 TABLET(1 MG) BY MOUTH EVERY 8 HOURS AS NEEDED FOR ANXIETY 07/03/24   Domenica Harlene LABOR, MD  midodrine (PROAMATINE) 2.5 MG tablet Take 1 tablet (2.5 mg total) by mouth 3 (three) times daily with meals. (1st dose upon waking around 7:00 or 8:00 AM, 2nd dose at 11:00 AM or 12:00 PM, 3rd dose at 2:00 or 3:00 PM) 09/04/24   Okey Vina GAILS, MD  pantoprazole  (PROTONIX ) 40 MG tablet TAKE 1 TABLET(40 MG) BY MOUTH TWICE DAILY BEFORE A MEAL 06/29/24   Avram Lupita BRAVO, MD  Pyridoxine HCl (B-6 PO) Take 1 tablet by mouth daily.    [provider]  Thiamine  HCl (B-1 PO) Take 1 tablet by mouth daily.    [provider]    Allergies: Sorbitol; Tomato; Zofran ; Advil  [ibuprofen ]; Cephalexin ; Diphenhydramine  hcl; Morphine  and codeine; Tramadol ; Antihistamines, chlorpheniramine-type; and Oxycodone     Review of Systems  Gastrointestinal:  Positive for vomiting.    Updated Vital Signs BP (!) 140/69   Pulse 82   Temp 98.3 F (36.8 C) (Oral)   Resp 16   Ht 5' 5.5 (1.664 m)   Wt 59.6 kg   SpO2 96%   BMI 21.53 kg/m   Physical Exam Vitals and nursing note reviewed.  Constitutional:  Appearance: She is well-developed.  HENT:     Head: Normocephalic and atraumatic.  Cardiovascular:     Rate and Rhythm: Normal rate and regular rhythm.  Pulmonary:     Effort: No respiratory distress.     Breath sounds: No stridor.  Abdominal:     General: There is no distension.  Musculoskeletal:        General: No swelling or tenderness. Normal range of motion.     Cervical back: Normal range of motion.  Skin:    General: Skin is warm and dry.  Neurological:     General: No focal deficit present.     Mental Status: She is alert.     (all labs ordered are listed, but only abnormal results are displayed) Labs Reviewed  CBC WITH DIFFERENTIAL/PLATELET - Abnormal; Notable for the following components:       Result Value   RBC 3.76 (*)    All other components within normal limits  COMPREHENSIVE METABOLIC PANEL WITH GFR - Abnormal; Notable for the following components:   Potassium 2.7 (*)    Glucose, Bld 118 (*)    All other components within normal limits  D-DIMER, QUANTITATIVE - Abnormal; Notable for the following components:   D-Dimer, Quant 0.71 (*)    All other components within normal limits  LIPASE, BLOOD  MAGNESIUM   TROPONIN T, HIGH SENSITIVITY    EKG: EKG Interpretation Date/Time:  Wednesday September 23 2024 03:06:15 EST Ventricular Rate:  83 PR Interval:  166 QRS Duration:  140 QT Interval:  430 QTC Calculation: 506 R Axis:   48  Text Interpretation: Sinus rhythm Right bundle branch block no change from previous Confirmed by Lorette Mayo [phone]) on 09/23/2024 3:41:25 AM  Radiology: No results found.   .Critical Care  Performed by: Lorette Mayo, MD Authorized by: Lorette Mayo, MD   Critical care provider statement:    Critical care time (minutes):  30   Critical care was necessary to treat or prevent imminent or life-threatening deterioration of the following conditions:  Metabolic crisis   Critical care was time spent personally by me on the following activities:  Development of treatment plan with patient or surrogate, discussions with consultants, evaluation of patient's response to treatment, examination of patient, ordering and review of laboratory studies, ordering and review of radiographic studies, ordering and performing treatments and interventions, pulse oximetry, re-evaluation of patient's condition and review of old charts    Medications Ordered in the ED  lactated ringers  bolus 1,000 mL (has no administration in time range)  metoCLOPramide  (REGLAN ) injection 5 mg (has no administration in time range)  potassium chloride  (KLOR-CON ) packet 40 mEq (40 mEq Oral Given 09/23/24 0402)    Clinical Course as of 09/23/24 0615  Wed Sep 23, 2024  0347  Informed by nursing that the patient wants to leave. Patient states she has been drinking water since being here and no longer has symptoms. We decided to wait for labs to ensure nothing critical and decide then.  [JM]    Clinical Course User Index [JM] Shatina Streets, Mayo, MD                                 Medical Decision Making Amount and/or Complexity of Data Reviewed Labs: ordered. ECG/medicine tests: ordered.  Risk Prescription drug management.   Initial plan was to rule out PE, MI, electrolyte abnormalities and likely CT however patient stated that she was able  to drink fluids while here and her other symptoms had fully resolved. Her K came back low but she wasn't interested in IV K or admission so started oral therapy here. Suggested talking to her oncologist team about switching meds and if any new/recurrent symptoms possibly GI. Also explained the possibility of her K worsening and reasons to return to the ED.    Final diagnoses:  Hypokalemia    ED Discharge Orders          Ordered    potassium chloride  (KLOR-CON ) 20 MEQ packet  2 times daily        09/23/24 0400               Rosann Gorum, Selinda, MD 09/23/24 [phone]

## 2024-09-29 DIAGNOSIS — J439 Emphysema, unspecified: Secondary | ICD-10-CM | POA: Diagnosis not present

## 2024-09-29 DIAGNOSIS — N644 Mastodynia: Secondary | ICD-10-CM | POA: Diagnosis not present

## 2024-09-29 DIAGNOSIS — E860 Dehydration: Secondary | ICD-10-CM | POA: Diagnosis not present

## 2024-09-29 DIAGNOSIS — R296 Repeated falls: Secondary | ICD-10-CM | POA: Diagnosis not present

## 2024-09-29 DIAGNOSIS — Z9049 Acquired absence of other specified parts of digestive tract: Secondary | ICD-10-CM | POA: Diagnosis not present

## 2024-09-29 DIAGNOSIS — M8580 Other specified disorders of bone density and structure, unspecified site: Secondary | ICD-10-CM | POA: Diagnosis not present

## 2024-09-29 DIAGNOSIS — E876 Hypokalemia: Secondary | ICD-10-CM | POA: Diagnosis not present

## 2024-09-29 DIAGNOSIS — Z5111 Encounter for antineoplastic chemotherapy: Secondary | ICD-10-CM | POA: Diagnosis not present

## 2024-09-29 DIAGNOSIS — C50911 Malignant neoplasm of unspecified site of right female breast: Secondary | ICD-10-CM | POA: Diagnosis not present

## 2024-09-29 DIAGNOSIS — Z79899 Other long term (current) drug therapy: Secondary | ICD-10-CM | POA: Diagnosis not present

## 2024-10-05 ENCOUNTER — Encounter: Payer: Self-pay | Admitting: Internal Medicine

## 2024-10-05 ENCOUNTER — Other Ambulatory Visit: Payer: Self-pay

## 2024-10-05 ENCOUNTER — Ambulatory Visit: Attending: Internal Medicine | Admitting: Internal Medicine

## 2024-10-05 VITALS — BP 122/74 | HR 94 | Ht 65.5 in | Wt 130.2 lb

## 2024-10-05 DIAGNOSIS — I951 Orthostatic hypotension: Secondary | ICD-10-CM | POA: Diagnosis not present

## 2024-10-05 DIAGNOSIS — E782 Mixed hyperlipidemia: Secondary | ICD-10-CM

## 2024-10-05 NOTE — Patient Instructions (Signed)
 Medication Instructions:  Your physician recommends that you continue on your current medications as directed. Please refer to the Current Medication list given to you today.  *If you need a refill on your cardiac medications before your next appointment, please call your pharmacy*  Lab Work: TODAY: BMET  Follow-Up: At [facility], you and your health needs are our priority.  As part of our continuing mission to provide you with exceptional heart care, our providers are all part of one team.  This team includes your primary Cardiologist (physician) and Advanced Practice Providers or APPs (Physician Assistants and Nurse Practitioners) who all work together to provide you with the care you need, when you need it.  Your next appointment:   6 months  Provider:   Vina Gull, MD

## 2024-10-05 NOTE — Progress Notes (Signed)
 Cardiology Office Note   Date:  10/05/2024   ID:  Anne, [name] 1941-02-10, MRN [id number]  PCP:  Anne Harlene LABOR, MD  Cardiologist:   Anne Gull, MD   Pt presents for f/u of dizziness and palpitations.    History of Present Illness: Anne [name] is a 83 y.o. female with a history of orthostatic intolerance, carotid stenosis, HL, metastatic breast CA, COPD, anemia, GERD, eosinophilic esophagitis,   2014  Echo Mild LVH, EF 55-60%, grade 1 diastolic dysfunction.  March 2014Event monitor NSR, extensive PVC, CV dz, palpitations.     April 2014  Myoview: Low risk, no scar or ischemia, not gated.  She has been treated with low dose metoprolol  for PVCs and palps.    Cortisol low at one point   Seen in endocrine   Normal on last check   The pt follows at [facility] for breast CA    Dizzy    I saw the pt in Oct 2025    She ws orthostatic at that visit (BP 115 laying  to 85/ standing)  I recommended midodrine   Unfortunately she developed diarrhea after starting  Stopped  Seen in ED 09/23/2024   K 2.7      Since then she has intermittent loose BMs every 4 to 5 days  She is followed in the cancer center   About to start a new chemo therapy   Some dizziness   Husband says BP has actually been very high at times   Current Meds  Medication Sig   acetaminophen  (TYLENOL ) 500 MG tablet Take 500-1,000 mg by mouth every 6 (six) hours as needed for moderate pain.   amitriptyline  (ELAVIL ) 25 MG tablet Take 4 tablets (100 mg total) by mouth at bedtime.   aspirin  81 MG EC tablet Take 81 mg by mouth daily.   Biotin 5 MG CAPS Take 5 mg by mouth daily.   Brimonidine Tartrate (LUMIFY) 0.025 % SOLN Place 1 drop into both eyes daily.   Cholecalciferol (VITAMIN D3) 1000 UNITS CAPS Take 2,000 Units by mouth daily.   Cyanocobalamin  (VITAMIN B 12 PO) Take 2 tablets by mouth daily.   diltiazem  (CARDIZEM ) 30 MG tablet As needed   FLUoxetine  (PROZAC ) 20 MG capsule Take 3 capsules (60 mg total) by mouth  daily.   Folic Acid  (FOLATE PO) Take 1 tablet by mouth daily.   LORazepam  (ATIVAN ) 1 MG tablet TAKE 1 TABLET(1 MG) BY MOUTH EVERY 8 HOURS AS NEEDED FOR ANXIETY   pantoprazole  (PROTONIX ) 40 MG tablet TAKE 1 TABLET(40 MG) BY MOUTH TWICE DAILY BEFORE A MEAL   Pyridoxine HCl (B-6 PO) Take 1 tablet by mouth daily.   Thiamine  HCl (B-1 PO) Take 1 tablet by mouth daily.     Allergies:   Sorbitol; Tomato; Zofran ; Advil  [ibuprofen ]; Cephalexin ; Diphenhydramine  hcl; Morphine  and codeine; Tramadol ; Antihistamines, chlorpheniramine-type; and Oxycodone    Past Medical History:  Diagnosis Date   ALKALINE PHOSPHATASE, ELEVATED 03/15/2009   Allergic state 06/10/2012   Allergy     Anemia    Anxiety and depression 04/28/2011   Arthritis    Asthma    Atypical chest pain 11/30/2011   AVM (arteriovenous malformation) of colon 2011   cecum   Baker's cyst of knee 05/22/2011   Cancer (HCC) 11/2006   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 Carotid duplex 03/2014: stable 1-39% BICA, f/u due 03/2016.   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     Clotting disorder    COPD (chronic obstructive pulmonary disease) (HCC)    Dementia (HCC)    Dermatitis 11/23/2012   Dyspnea    EE (eosinophilic esophagitis)    Emphysema of lung (HCC)    Esophageal ring    ESOPHAGEAL STRICTURE 03/29/2009   Fall 11/23/2012   Family history of breast cancer    Family history of colon cancer    Family history of ovarian cancer    Family history of pancreatic cancer    Folliculitis of nose 12/31/2011   GERD (gastroesophageal reflux disease) 09/29/2009   improved s/p cholecystectomy and esophagus dilatation   History of chicken pox    History of measles    History of shingles    2 episodes   Hx of echocardiogram    a. Echo 01/2013: mild LVH, EF 55-60%, normal wall motion, Gr 1 diast dysfn   Hyperlipidemia    Hypertension     Knee pain, bilateral 07/23/2011   Medicare annual wellness visit, subsequent 06/19/2015   Mixed hyperlipidemia 10/17/2010   Qualifier: Diagnosis of  By: Edsel Mems     Orthostasis    Osteopenia 03/14/2011   Osteoporosis    Personal history of chemotherapy 2001   Personal history of radiation therapy 2001   rt breast   PERSONAL HX BREAST CANCER 09/29/2009   Pneumonia    PVC's (premature ventricular contractions)    a. Event monitor 01/2013: NSR, extensive PVCs.   Radial neck fracture 10/2011   minimally displaced   Substance abuse (HCC)    In remission 3 years.    Urinary incontinence 03/19/2012   Vaginitis 05/22/2011    Past Surgical History:  Procedure Laterality Date   APPENDECTOMY     AUGMENTATION MAMMAPLASTY Right 05/27/2007   BREAST BIOPSY Right 01/02/2007   wire loc   BREAST BIOPSY  12/26/2006   BREAST LUMPECTOMY Right 2001   BREAST RECONSTRUCTION  2008, 2009, 2010   BREAST REDUCTION WITH MASTOPEXY Left 05/30/2017   Procedure: LEFT BREAST REDUCTION FOR SYMTERY WITH MASTOPEXY;  Surgeon: Lowery Estefana RAMAN, DO;  Location: [facility];  Service: Plastics;  Laterality: Left;   CHOLECYSTECTOMY  2010   COLONOSCOPY  09/05/10   cecal avm's   DENTAL SURGERY  05/2016   4 dental implants by Dr. Dorthula.   ERCP  2010    CBD stone extraction    ESOPHAGOGASTRODUODENOSCOPY  01/08/2012   Procedure: ESOPHAGOGASTRODUODENOSCOPY (EGD);  Surgeon: Lupita FORBES Commander, MD;  Location: [facility];  Service: Endoscopy;  Laterality: N/A;   ESOPHAGOGASTRODUODENOSCOPY (EGD) WITH ESOPHAGEAL DILATION  2010, 2012   LAPAROSCOPIC APPENDECTOMY N/A 08/04/2016   Procedure: APPENDECTOMY LAPAROSCOPIC;  Surgeon: Elspeth Schultze, MD;  Location: [facility];  Service: General;  Laterality: N/A;   LIPOSUCTION WITH LIPOFILLING Left 11/21/2017   Procedure: LIPOSUCTION FROM ABDOMEN WITH LIPOFILLING TO LEFT BREAST;  Surgeon: Lowery Estefana RAMAN, DO;  Location: [facility];  Service:  Plastics;  Laterality: Left;   MASTECTOMY MODIFIED RADICAL Right 05/27/2007   , Mastectomy modified radical (08), breast reconstruction, CA lesions excised lateral abd wall 2010   MASTOPEXY Left 11/21/2017   Procedure: LEFT BREAST REVISION MASTOPEXY FOR SYMMETRY;  Surgeon: Lowery Estefana RAMAN, DO;  Location: [facility];  Service: Plastics;  Laterality: Left;   REDUCTION MAMMAPLASTY Left    SAVORY DILATION  01/08/2012   Procedure: SAVORY DILATION;  Surgeon: Lupita FORBES Commander, MD;  Location: [facility];  Service: Endoscopy;  Laterality: N/A;  need xray     Social History:  The patient  reports that she quit smoking about 17 years ago. Her smoking use included cigarettes. She started smoking about 67 years ago. She has a 50 pack-year smoking history. She has never used smokeless tobacco. She reports that she does not currently use alcohol. She reports that she does not use drugs.   Family History:  The patient's family history includes Alcohol abuse in her maternal grandfather; Anxiety disorder in her sister; Arthritis in her sister; Breast cancer in her cousin, cousin, maternal aunt, maternal aunt, maternal aunt, and paternal aunt; Cancer in her maternal uncle; Cirrhosis in her sister; Esophageal cancer in her paternal grandfather; Heart disease in her father and paternal grandfather; Lung cancer in her father and paternal aunt; Osteoporosis in her sister and sister; Other in her mother; Ovarian cancer (age of onset: 37) in her maternal aunt; Pancreatic cancer in her maternal aunt and maternal uncle; Rectal cancer in her paternal grandfather; Skin cancer in her sister; Stomach cancer in her maternal uncle; Stroke in her maternal grandmother.    ROS:  Please see the history of present illness. All other systems are reviewed and  Negative to the above problem except as noted.    PHYSICAL EXAM: VS:  BP 122/74   Pulse 94   Ht 5' 5.5 (1.664 m)   Wt 130 lb 3.2 oz (59.1 kg)   SpO2 93%    BMI 21.34 kg/m   GEN:  Thin 83 yo  in no acute distress  HEENT: normal  Neck: no JVD, no carotid bruits Cardiac: RRR; no murmurs Respiratory:  clear to auscultation  GI: soft, nontender No hepatomegaly  Ex  No LE edema   EKG:  EKG is not ordered today.   Echo  2021  1. Left ventricular ejection fraction, by estimation, is 60 to 65%. The  left ventricle has normal function. The left ventricle has no regional  wall motion abnormalities. Left ventricular diastolic parameters are  consistent with Grade I diastolic  dysfunction (impaired relaxation). The average left ventricular global  longitudinal strain is -19.8 %. The global longitudinal strain is normal.   2. Right ventricular systolic function is normal. The right ventricular  size is normal.   3. The mitral valve is normal in structure. Trivial mitral valve  regurgitation. No evidence of mitral stenosis.   4. The aortic valve is normal in structure. Aortic valve regurgitation is  not visualized. No aortic stenosis is present.   5. The inferior vena cava is normal in size with greater than 50%  respiratory variability, suggesting right atrial pressure of 3 mmHg.   Comparison(s): 02/28/18 EF 55-60%.   Carotid USN   2017  Heterogeneous plaque, bilaterally. Stable 1-39% bilateral ICA stenosis. Normal subclavian arteries, bilaterally. Patent vertebral arteries with antegrade flow. f/u PRN. Lipid Panel    Component Value Date/Time   CHOL 143 04/04/2023 1613   TRIG 166.0 (H) 04/04/2023 1613   HDL 65.70 04/04/2023 1613   CHOLHDL 2 04/04/2023 1613   VLDL 33.2 04/04/2023 1613   LDLCALC 44 04/04/2023 1613   LDLDIRECT 117.4 01/13/2013 1210      Wt Readings from Last 3 Encounters:  10/05/24 130 lb 3.2 oz (59.1 kg)  09/23/24 131 lb 6.3 oz (59.6 kg)  09/04/24 131 lb 6.4 oz (59.6 kg)      ASSESSMENT AND PLAN:  1  Orthostatic hypotension Pt was profoundly orthostatic last visit   Did  not tolerate midodrine  due to diarrhea    (with N/V and diarrhea, K was low)\ BP is better now though about to start chemo again.   May go down For now I would keep on same regimen   Hydrate  Needs to get over 80 oz per day   2  Hypokalemia   Will check BMET to see if recovered   3 Hx palpitations   Pt without complaints  3  CV dz  Last USN in 2017 showed mild plaquing   4  HL  Last LDL in May 2024 was 44    She was on lipitor at the time    [place] lget her to come back for lipids    5  Hx Breast CA  Follows in oncology at [facility]   Markers going up   She is due to start a new treatment   Current medicines are reviewed at length with the patient today.  The patient does not have concerns regarding medicines.  Signed, Anne Gull, MD  10/05/2024 2:15 PM

## 2024-10-06 ENCOUNTER — Ambulatory Visit: Payer: Self-pay | Admitting: Internal Medicine

## 2024-10-06 LAB — BASIC METABOLIC PANEL WITH GFR
BUN/Creatinine Ratio: 31 — ABNORMAL HIGH (ref 12–28)
BUN: 18 mg/dL (ref 8–27)
CO2: 26 mmol/L (ref 20–29)
Calcium: 9 mg/dL (ref 8.7–10.3)
Chloride: 101 mmol/L (ref 96–106)
Creatinine, Ser: 0.58 mg/dL (ref 0.57–1.00)
Glucose: 107 mg/dL — ABNORMAL HIGH (ref 70–99)
Potassium: 4.3 mmol/L (ref 3.5–5.2)
Sodium: 138 mmol/L (ref 134–144)
eGFR: 90 mL/min/1.73 (ref >59)

## 2024-10-19 ENCOUNTER — Other Ambulatory Visit: Payer: Self-pay | Admitting: Family Medicine

## 2024-10-19 DIAGNOSIS — S299XXA Unspecified injury of thorax, initial encounter: Secondary | ICD-10-CM

## 2024-10-19 NOTE — Telephone Encounter (Signed)
 Requesting: lorazepam  1mg  Contract: No UDS: 11/19/23 Last Visit: 08/19/24 Next Visit: Visit date not found Last Refill: 07/03/24  Please Advise

## 2024-10-26 DIAGNOSIS — R918 Other nonspecific abnormal finding of lung field: Secondary | ICD-10-CM | POA: Diagnosis not present

## 2024-10-26 DIAGNOSIS — C50911 Malignant neoplasm of unspecified site of right female breast: Secondary | ICD-10-CM | POA: Diagnosis not present

## 2024-10-26 DIAGNOSIS — R590 Localized enlarged lymph nodes: Secondary | ICD-10-CM | POA: Diagnosis not present

## 2024-10-26 DIAGNOSIS — K209 Esophagitis, unspecified without bleeding: Secondary | ICD-10-CM | POA: Diagnosis not present

## 2024-10-27 DIAGNOSIS — C7800 Secondary malignant neoplasm of unspecified lung: Secondary | ICD-10-CM | POA: Diagnosis not present

## 2024-10-27 DIAGNOSIS — E876 Hypokalemia: Secondary | ICD-10-CM | POA: Diagnosis not present

## 2024-10-27 DIAGNOSIS — C50911 Malignant neoplasm of unspecified site of right female breast: Secondary | ICD-10-CM | POA: Diagnosis not present

## 2024-10-27 DIAGNOSIS — N644 Mastodynia: Secondary | ICD-10-CM | POA: Diagnosis not present

## 2024-10-27 DIAGNOSIS — R197 Diarrhea, unspecified: Secondary | ICD-10-CM | POA: Diagnosis not present

## 2024-10-27 DIAGNOSIS — R7989 Other specified abnormal findings of blood chemistry: Secondary | ICD-10-CM | POA: Diagnosis not present

## 2024-10-27 DIAGNOSIS — Z17411 Hormone receptor positive with human epidermal growth factor receptor 2 negative status: Secondary | ICD-10-CM | POA: Diagnosis not present

## 2024-10-27 DIAGNOSIS — C792 Secondary malignant neoplasm of skin: Secondary | ICD-10-CM | POA: Diagnosis not present

## 2024-10-27 DIAGNOSIS — E860 Dehydration: Secondary | ICD-10-CM | POA: Diagnosis not present

## 2024-10-27 DIAGNOSIS — M8580 Other specified disorders of bone density and structure, unspecified site: Secondary | ICD-10-CM | POA: Diagnosis not present

## 2024-10-27 DIAGNOSIS — R0602 Shortness of breath: Secondary | ICD-10-CM | POA: Diagnosis not present

## 2024-10-27 DIAGNOSIS — J439 Emphysema, unspecified: Secondary | ICD-10-CM | POA: Diagnosis not present

## 2024-10-27 DIAGNOSIS — Z5111 Encounter for antineoplastic chemotherapy: Secondary | ICD-10-CM | POA: Diagnosis not present

## 2024-10-31 ENCOUNTER — Other Ambulatory Visit: Payer: Self-pay | Admitting: Internal Medicine

## 2024-11-07 ENCOUNTER — Other Ambulatory Visit: Payer: Self-pay

## 2024-11-09 ENCOUNTER — Ambulatory Visit: Admitting: Obstetrics

## 2024-11-09 ENCOUNTER — Ambulatory Visit
Admission: EM | Admit: 2024-11-09 | Discharge: 2024-11-09 | Disposition: A | Discharge status: Home / Self Care | Attending: Family Medicine | Admitting: Family Medicine

## 2024-11-09 DIAGNOSIS — J069 Acute upper respiratory infection, unspecified: Secondary | ICD-10-CM

## 2024-11-09 DIAGNOSIS — R059 Cough, unspecified: Secondary | ICD-10-CM

## 2024-11-09 MED ORDER — PREDNISONE 10 MG (21) PO TBPK: ORAL_TABLET | Freq: Every day | ORAL | 0 refills | Status: DC

## 2024-11-09 MED ORDER — DOXYCYCLINE HYCLATE 100 MG PO CAPS: 100.0000 mg | ORAL_CAPSULE | Freq: Two times a day (BID) | ORAL | 0 refills | Status: DC

## 2024-11-09 NOTE — ED Provider Notes (Signed)
 " [facility]    CSN: [id number] Arrival date & time: 11/09/24  1514      History   Chief Complaint Chief Complaint  Patient presents with   Nasal Congestion    Chest   Cough    HPI Anne [name] is a 83 y.o. female.   HPI 83 year old female presents with cough cough/chest congestion for 10 days.  Reports chest hurts when she coughs.  Patient reports recently evaluated [facility] for cancer scan on 12/15 was prescribed Tessalon  at time reports history of metastatic breast cancer which is spread to lungs.SABRA  PMH significant for CAD, cancer, and COPD.  Patient is accompanied by her husband this evening.  Past Medical History:  Diagnosis Date   ALKALINE PHOSPHATASE, ELEVATED 03/15/2009   Allergic state 06/10/2012   Allergy     Anemia    Anxiety and depression 04/28/2011   Arthritis    Asthma    Atypical chest pain 11/30/2011   AVM (arteriovenous malformation) of colon 2011   cecum   Baker's cyst of knee 05/22/2011   Cancer (HCC) 11/2006   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 Carotid duplex 03/2014: stable 1-39% BICA, f/u due 03/2016.   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     Clotting disorder    COPD (chronic obstructive pulmonary disease) (HCC)    Dementia (HCC)    Dermatitis 11/23/2012   Dyspnea    EE (eosinophilic esophagitis)    Emphysema of lung (HCC)    Esophageal ring    ESOPHAGEAL STRICTURE 03/29/2009   Fall 11/23/2012   Family history of breast cancer    Family history of colon cancer    Family history of ovarian cancer    Family history of pancreatic cancer    Folliculitis of nose 12/31/2011   GERD (gastroesophageal reflux disease) 09/29/2009   improved s/p cholecystectomy and esophagus dilatation   History of chicken pox    History of measles    History of shingles    2 episodes   Hx of echocardiogram    a. Echo 01/2013: mild LVH, EF  55-60%, normal wall motion, Gr 1 diast dysfn   Hyperlipidemia    Hypertension    Knee pain, bilateral 07/23/2011   Medicare annual wellness visit, subsequent 06/19/2015   Mixed hyperlipidemia 10/17/2010   Qualifier: Diagnosis of  By: Edsel Mems     Orthostasis    Osteopenia 03/14/2011   Osteoporosis    Personal history of chemotherapy 2001   Personal history of radiation therapy 2001   rt breast   PERSONAL HX BREAST CANCER 09/29/2009   Pneumonia    PVC's (premature ventricular contractions)    a. Event monitor 01/2013: NSR, extensive PVCs.   Radial neck fracture 10/2011   minimally displaced   Substance abuse (HCC)    In remission 3 years.    Urinary incontinence 03/19/2012   Vaginitis 05/22/2011    Patient Active Problem List   Diagnosis Date Noted   Fall 06/26/2024   Gait instability 06/26/2024   Esophageal abnormality 06/26/2024   Intractable episodic tension-type headache 06/26/2024   Fracture of right acetabulum (HCC) 02/04/2023   Neuropathy 11/23/2022   Fibromyalgia 10/16/2022   Primary osteoarthritis of right knee 09/04/2022   Breast mass, left 05/03/2022   Severe depression (HCC) 03/08/2022   Memory changes 02/03/2022   Insomnia 12/28/2020   Vertigo 12/28/2020  Emphysema lung (HCC) 06/06/2020   RLS (restless legs syndrome) 02/24/2020   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   Facial trauma, sequela 08/09/2019   Hyperglycemia 08/19/2018   Mild intermittent asthma without complication 06/17/2018   Asthma 04/30/2018   Macular degeneration, 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   Palpitations 04/30/2018   Lumbar spondylosis 09/23/2017   Hypokalemia 08/11/2017   Abdominal pain 08/11/2017   SBO (small bowel obstruction) (HCC) 08/10/2017   History of right breast cancer 03/21/2017   Genetic testing 09/13/2016   Family history of breast cancer    Family history of pancreatic cancer    Family history of colon cancer    Pain in the chest 05/16/2016   Status post right  breast reconstruction 05/11/2016   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) 05/11/2016   History of breast cancer in female 05/11/2016   Osteopenia determined by x-ray 05/11/2016   IBS (irritable bowel syndrome) 03/09/2016   Dyspnea 06/19/2015   Medicare annual wellness visit, subsequent 06/19/2015   Urinary frequency 12/12/2014   Breast cancer, right breast (HCC) 10/18/2014   Superficial thrombophlebitis 03/16/2014   Plant dermatitis 03/16/2014   Chest wall pain 02/07/2014   Primary osteoarthritis of both hips 01/21/2014   Elevated sed rate 08/02/2013   Dermatitis 11/23/2012   Falls 11/23/2012   Preventative health care 10/21/2012   Allergy  06/10/2012   Urinary incontinence 03/19/2012   Anemia 12/21/2011   Baker's cyst of knee 05/22/2011   Eosinophilic esophagitis 05/11/2011   Anxiety and depression 04/28/2011   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   Constipation, chronic 03/15/2011   Mixed hyperlipidemia 10/17/2010   CAROTID ARTERY STENOSIS 01/13/2010   GERD 09/29/2009   PERSONAL HX BREAST CANCER 09/29/2009    Past Surgical History:  Procedure Laterality Date   APPENDECTOMY     AUGMENTATION MAMMAPLASTY Right 05/27/2007   BREAST BIOPSY Right 01/02/2007   wire loc   BREAST BIOPSY  12/26/2006   BREAST LUMPECTOMY Right 2001   BREAST RECONSTRUCTION  2008, 2009, 2010   BREAST REDUCTION WITH MASTOPEXY Left 05/30/2017   Procedure: LEFT BREAST REDUCTION FOR SYMTERY WITH MASTOPEXY;  Surgeon: Lowery Estefana RAMAN, DO;  Location: [facility];  Service: Plastics;  Laterality: Left;   CHOLECYSTECTOMY  2010   COLONOSCOPY  09/05/10   [facility]   DENTAL SURGERY  05/2016   4 dental implants by Dr. Dorthula.   ERCP  2010    CBD stone extraction    ESOPHAGOGASTRODUODENOSCOPY  01/08/2012   Procedure: ESOPHAGOGASTRODUODENOSCOPY (EGD);  Surgeon: Lupita FORBES Commander, MD;  Location: [facility];  Service: Endoscopy;  Laterality: N/A;   ESOPHAGOGASTRODUODENOSCOPY (EGD)  WITH ESOPHAGEAL DILATION  2010, 2012   LAPAROSCOPIC APPENDECTOMY N/A 08/04/2016   Procedure: APPENDECTOMY LAPAROSCOPIC;  Surgeon: Elspeth Schultze, MD;  Location: [facility];  Service: General;  Laterality: N/A;   LIPOSUCTION WITH LIPOFILLING Left 11/21/2017   Procedure: LIPOSUCTION FROM ABDOMEN WITH LIPOFILLING TO LEFT BREAST;  Surgeon: Lowery Estefana RAMAN, DO;  Location: [facility];  Service: Plastics;  Laterality: Left;   MASTECTOMY MODIFIED RADICAL Right 05/27/2007   , Mastectomy modified radical (08), breast reconstruction, CA lesions excised lateral abd wall 2010   MASTOPEXY Left 11/21/2017   Procedure: LEFT BREAST REVISION MASTOPEXY FOR SYMMETRY;  Surgeon: Lowery Estefana RAMAN, DO;  Location: [facility];  Service: Plastics;  Laterality: Left;   REDUCTION MAMMAPLASTY Left    SAVORY DILATION  01/08/2012   Procedure: SAVORY DILATION;  Surgeon: Lupita FORBES Commander, MD;  Location: [facility];  Service:  Endoscopy;  Laterality: N/A;  need xray    OB History   No obstetric history on file.      Home Medications    Prior to Admission medications  Medication Sig Start Date End Date Taking? Authorizing Provider  doxycycline  (VIBRAMYCIN ) 100 MG capsule Take 1 capsule (100 mg total) by mouth 2 (two) times daily for 10 days. 11/09/24 11/19/24 Yes Teddy Sharper, FNP  predniSONE  (STERAPRED UNI-PAK 21 TAB) 10 MG (21) TBPK tablet Take by mouth daily. Take 6 tabs by mouth daily  for 2 days, then 5 tabs for 2 days, then 4 tabs for 2 days, then 3 tabs for 2 days, 2 tabs for 2 days, then 1 tab by mouth daily for 2 days 11/09/24  Yes Teddy Sharper, FNP  acetaminophen  (TYLENOL ) 500 MG tablet Take 500-1,000 mg by mouth every 6 (six) hours as needed for moderate pain.    [provider]  amitriptyline  (ELAVIL ) 25 MG tablet Take 4 tablets (100 mg total) by mouth at bedtime. 09/07/24   Domenica Harlene LABOR, MD  aspirin  81 MG EC tablet Take 81 mg by mouth daily.    [provider]   Biotin 5 MG CAPS Take 5 mg by mouth daily.    [provider]  Brimonidine Tartrate (LUMIFY) 0.025 % SOLN Place 1 drop into both eyes daily.    [provider]  Cholecalciferol (VITAMIN D3) 1000 UNITS CAPS Take 2,000 Units by mouth daily.    [provider]  Cyanocobalamin  (VITAMIN B 12 PO) Take 2 tablets by mouth daily.    [provider]  diltiazem  (CARDIZEM ) 30 MG tablet As needed 08/27/23   Vicci Rollo SAUNDERS, PA-C  FLUoxetine  (PROZAC ) 20 MG capsule Take 3 capsules (60 mg total) by mouth daily. 03/18/24   Domenica Harlene LABOR, MD  Folic Acid  (FOLATE PO) Take 1 tablet by mouth daily.    [provider]  LORazepam  (ATIVAN ) 1 MG tablet TAKE 1 TABLET(1 MG) BY MOUTH EVERY 8 HOURS AS NEEDED FOR ANXIETY 10/19/24   Webb, Padonda B, FNP  midodrine  (PROAMATINE ) 2.5 MG tablet Take 1 tablet (2.5 mg total) by mouth 3 (three) times daily with meals. (1st dose upon waking around 7:00 or 8:00 AM, 2nd dose at 11:00 AM or 12:00 PM, 3rd dose at 2:00 or 3:00 PM) Patient not taking: Reported on 10/05/2024 09/04/24   Okey Vina GAILS, MD  pantoprazole  (PROTONIX ) 40 MG tablet TAKE 1 TABLET(40 MG) BY MOUTH TWICE DAILY BEFORE A MEAL 11/02/24   Avram Lupita BRAVO, MD  potassium chloride  (KLOR-CON ) 20 MEQ packet Take 40 mEq by mouth 2 (two) times daily for 7 days. Patient not taking: Reported on 10/05/2024 09/23/24 09/30/24  Mesner, Jason, MD  Pyridoxine HCl (B-6 PO) Take 1 tablet by mouth daily.    [provider]  Thiamine  HCl (B-1 PO) Take 1 tablet by mouth daily.    [provider]    Family History Family History  Problem Relation Age of Onset   Other Mother        tic [place]   Heart disease Father    Lung cancer Father        smoker   Cirrhosis Sister        Primary Biliary   Anxiety disorder Sister    Osteoporosis Sister    Arthritis Sister        Rheumatoid   Osteoporosis Sister    Skin cancer Sister        multiple  skin cancers, over 90  excisions.   Stroke Maternal Grandmother    Alcohol abuse Maternal Grandfather    Heart disease Paternal Grandfather    Esophageal cancer Paternal Grandfather    Rectal cancer Paternal Grandfather    Breast cancer Maternal Aunt        dx in her 89s   Breast cancer Maternal Aunt        dx in her 48s   Breast cancer Maternal Aunt        possible breast cancer dx and died in her 54s   Ovarian cancer Maternal Aunt 29   Pancreatic cancer Maternal Aunt        dx in her 63s   Pancreatic cancer Maternal Uncle        dx in his 56s; smoker   Stomach cancer Maternal Uncle    Cancer Maternal Uncle    Breast cancer Paternal Aunt        dx in her 86s   Lung cancer Paternal Aunt    Breast cancer Cousin        paternal first cousin   Breast cancer Cousin        maternal first cousin   Anesthesia problems Neg Hx    Hypotension Neg Hx    Malignant hyperthermia Neg Hx    Pseudochol deficiency Neg Hx     Social History Social History[1]   Allergies   Sorbitol; Tomato; Zofran ; Advil  [ibuprofen ]; Cephalexin ; Diphenhydramine  hcl; Morphine  and codeine; Tramadol ; Antihistamines, chlorpheniramine-type; and Oxycodone    Review of Systems Review of Systems  HENT:  Positive for congestion.   Respiratory:  Positive for chest tightness.   All other systems reviewed and are negative.    Physical Exam Triage Vital Signs ED Triage Vitals  Encounter Vitals Group     BP      Girls Systolic BP Percentile      Girls Diastolic BP Percentile      Boys Systolic BP Percentile      Boys Diastolic BP Percentile      Pulse      Resp      Temp      Temp src      SpO2      Weight      Height      Head Circumference      Peak Flow      Pain Score      Pain Loc      Pain Education      Exclude from Growth Chart    No data found.  Updated Vital Signs BP 105/65 (BP Location: Right Arm)   Pulse 87   Temp 98.4 F (36.9 C) (Oral)   Resp 17   SpO2 94%    Physical Exam Vitals and nursing  note reviewed.  Constitutional:      Appearance: Normal appearance. She is normal weight. She is ill-appearing.  HENT:     Head: Normocephalic and atraumatic.     Right Ear: Tympanic membrane, ear canal and external ear normal.     Left Ear: Tympanic membrane, ear canal and external ear normal.     Mouth/Throat:     Mouth: Mucous membranes are moist.     Pharynx: Oropharynx is clear.  Eyes:     Extraocular Movements: Extraocular movements intact.     Conjunctiva/sclera: Conjunctivae normal.     Pupils: Pupils are equal, round, and reactive to light.  Cardiovascular:     Rate and Rhythm: Normal rate  and regular rhythm.     Heart sounds: Normal heart sounds.  Pulmonary:     Effort: Pulmonary effort is normal.     Breath sounds: Normal breath sounds. No wheezing, rhonchi or rales.     Comments: Infrequent nonproductive cough on exam Musculoskeletal:        General: Normal range of motion.  Skin:    General: Skin is warm and dry.  Neurological:     General: No focal deficit present.     Mental Status: She is alert and oriented to person, place, and time. Mental status is at baseline.  Psychiatric:        Mood and Affect: Mood normal.        Behavior: Behavior normal.      UC Treatments / Results  Labs (all labs ordered are listed, but only abnormal results are displayed) Labs Reviewed - No data to display  EKG   Radiology No results found.  Procedures Procedures (including critical care time)  Medications Ordered in UC Medications - No data to display  Initial Impression / Assessment and Plan / UC Course  I have reviewed the triage vital signs and the nursing notes.  Pertinent labs & imaging results that were available during my care of the patient were reviewed by me and considered in my medical decision making (see chart for details).     MDM: 1.  Acute URI-Rx'd doxycycline  100 mg capsule: Take 1 capsule twice daily x 10 days; 2.  Cough, unspecified type-Rx'd  Sterapred Unipak (42 tab 10 mg taper): Take as directed. Advised patient take medications as directed with food to completion.  Advised to take prednisone  with first dose of doxycycline  for the next 10 days.  Encouraged to increase daily water intake to 64 ounces per day while taking these medications.  Advised if symptoms worsen and/or unresolved please follow-up with your PCP, oncology, or here for further evaluation.  Final Clinical Impressions(s) / UC Diagnoses   Final diagnoses:  Acute URI  Cough, unspecified type     Discharge Instructions      Advised patient take medications as directed with food to completion.  Advised to take prednisone  with first dose of doxycycline  for the next 10 days.  Encouraged to increase daily water intake to 64 ounces per day while taking these medications.  Advised if symptoms worsen and/or unresolved please follow-up with your PCP, oncology, or here for further evaluation.     ED Prescriptions     Medication Sig Dispense Auth. Provider   doxycycline  (VIBRAMYCIN ) 100 MG capsule Take 1 capsule (100 mg total) by mouth 2 (two) times daily for 10 days. 20 capsule Rockey Guarino, FNP   predniSONE  (STERAPRED UNI-PAK 21 TAB) 10 MG (21) TBPK tablet Take by mouth daily. Take 6 tabs by mouth daily  for 2 days, then 5 tabs for 2 days, then 4 tabs for 2 days, then 3 tabs for 2 days, 2 tabs for 2 days, then 1 tab by mouth daily for 2 days 42 tablet Teddy Sharper, FNP      PDMP not reviewed this encounter.    [1]  Social History Tobacco Use   Smoking status: Former    Current packs/day: 0.00    Average packs/day: 1 pack/day for 50.0 years (50.0 ttl pk-yrs)    Types: Cigarettes    Start date: 05/21/1957    Quit date: 05/22/2007    Years since quitting: 17.4   Smokeless tobacco: Never  Vaping Use  Vaping status: Never Used  Substance Use Topics   Alcohol use: Not Currently   Drug use: No     Teddy Sharper, FNP 11/09/24 1659  "

## 2024-11-09 NOTE — ED Triage Notes (Addendum)
 Pt c/o cough and chest congestion x 10+ days. States is hurts her chest when she coughs. Denies fever. Taking tylenol  and robitussin prn. Was seen at [facility] for a cancer scan on 12/15, was also rx'd tessalon  at the time. Hx of metastatic breast cancer which has spread to lungs.

## 2024-11-09 NOTE — Discharge Instructions (Addendum)
 Advised patient take medications as directed with food to completion.  Advised to take prednisone  with first dose of doxycycline  for the next 10 days.  Encouraged to increase daily water intake to 64 ounces per day while taking these medications.  Advised if symptoms worsen and/or unresolved please follow-up with your PCP, oncology, or here for further evaluation.

## 2024-11-09 NOTE — Progress Notes (Deleted)
 " New Patient Evaluation and Consultation  Referring Provider: Domenica Harlene LABOR, MD PCP: Domenica Harlene LABOR, MD Date of Service: 11/09/2024  SUBJECTIVE Chief Complaint: No chief complaint on file.  History of Present Illness: Anne [name] is a 83 y.o. {ED SANE MJRZ:80315} female seen in consultation at the request of {Dr, PA, WE:68877} Domenica for evaluation of ***.    ***Review of records significant for: ***metastatic breast cancer, chemotherapy induced neuropathy, emphysema, fibromyalgia, dementia  Urinary Symptoms: {urine leakage?:24754} Leaks *** time(s) per {days/wks/mos/yrs:310907}.  Pad use: {NUMBERS 1-10:18281} {pad option:24752} per day.   Patient {ACTION; IS/IS WNU:78978602} bothered by UI symptoms.  Day time voids ***.  Nocturia: *** times per night to void. Voiding dysfunction:  {empties:24755} bladder well.  Patient {DOES NOT does:27190::does not} use a catheter to empty bladder.  When urinating, patient feels {urine symptoms:24756} Drinks: *** per day  UTIs: {NUMBERS 1-10:18281} UTI's in the last year.   {ACTIONS;DENIES/REPORTS:21021675::Denies} history of {urologic concerns:24757} No results found for the last 90 days.   Pelvic Organ Prolapse Symptoms:                  Patient {denies/ admits to:24761} a feeling of a bulge the vaginal area. It has been present for {NUMBER 1-10:22536} {days/wks/mos/yrs:310907}.  Patient {denies/ admits to:24761} seeing a bulge.  This bulge {ACTION; IS/IS WNU:78978602} bothersome.  Bowel Symptom: Bowel movements: *** time(s) per {Time; day/week/month:13537} with constipation Stool consistency: {stool consistency:24758} Straining: {yes/no:19897}.  Splinting: {yes/no:19897}.  Incomplete evacuation: {yes/no:19897}.  Patient {denies/ admits to:24761} accidental bowel leakage / fecal incontinence  Occurs: *** time(s) per {Time; day/week/month:13537}  Consistency with leakage: {stool consistency:24758} Bowel regimen: {bowel  regimen:24759} Last colonoscopy: Date ***, Results *** [facility] Colonoscopy          Completed or No Longer Recommended     Colonoscopy  Discontinued      Frequency changed to Never automatically (Topic No Longer Applies)   10/17/2018  COLONOSCOPY  Only the first 1 history entries have been loaded, but more history exists.               Sexual Function Sexually active: {yes/no:19897}.  Sexual orientation: {Sexual Orientation:[phone]} Pain with sex: {pain with sex:24762}  Pelvic Pain {denies/ admits to:24761} pelvic pain Location: *** Pain occurs: *** Prior pain treatment: *** Improved by: *** Worsened by: ***   Past Medical History:  Past Medical History:  Diagnosis Date   ALKALINE PHOSPHATASE, ELEVATED 03/15/2009   Allergic state 06/10/2012   Allergy     Anemia    Anxiety and depression 04/28/2011   Arthritis    Asthma    Atypical chest pain 11/30/2011   AVM (arteriovenous malformation) of colon 2011   cecum   Baker's cyst of knee 05/22/2011   Cancer (HCC) 11/2006   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 Carotid duplex 03/2014: stable 1-39% BICA, f/u due 03/2016.   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     Clotting disorder    COPD (chronic obstructive pulmonary disease) (HCC)    Dementia (HCC)    Dermatitis 11/23/2012   Dyspnea    EE (eosinophilic esophagitis)    Emphysema of lung (HCC)    Esophageal ring    ESOPHAGEAL STRICTURE 03/29/2009   Fall 11/23/2012   Family history of breast cancer    Family history of colon cancer    Family history of ovarian cancer  Family history of pancreatic cancer    Folliculitis of nose 12/31/2011   GERD (gastroesophageal reflux disease) 09/29/2009   improved s/p cholecystectomy and esophagus dilatation   History of chicken pox    History of measles    History of shingles    2 episodes   Hx of  echocardiogram    a. Echo 01/2013: mild LVH, EF 55-60%, normal wall motion, Gr 1 diast dysfn   Hyperlipidemia    Hypertension    Knee pain, bilateral 07/23/2011   Medicare annual wellness visit, subsequent 06/19/2015   Mixed hyperlipidemia 10/17/2010   Qualifier: Diagnosis of  By: Edsel Mems     Orthostasis    Osteopenia 03/14/2011   Osteoporosis    Personal history of chemotherapy 2001   Personal history of radiation therapy 2001   rt breast   PERSONAL HX BREAST CANCER 09/29/2009   Pneumonia    PVC's (premature ventricular contractions)    a. Event monitor 01/2013: NSR, extensive PVCs.   Radial neck fracture 10/2011   minimally displaced   Substance abuse (HCC)    In remission 3 years.    Urinary incontinence 03/19/2012   Vaginitis 05/22/2011     Past Surgical History:   Past Surgical History:  Procedure Laterality Date   APPENDECTOMY     AUGMENTATION MAMMAPLASTY Right 05/27/2007   BREAST BIOPSY Right 01/02/2007   wire loc   BREAST BIOPSY  12/26/2006   BREAST LUMPECTOMY Right 2001   BREAST RECONSTRUCTION  2008, 2009, 2010   BREAST REDUCTION WITH MASTOPEXY Left 05/30/2017   Procedure: LEFT BREAST REDUCTION FOR SYMTERY WITH MASTOPEXY;  Surgeon: Lowery Estefana RAMAN, DO;  Location: [facility];  Service: Plastics;  Laterality: Left;   CHOLECYSTECTOMY  2010   COLONOSCOPY  09/05/10   cecal avm's   DENTAL SURGERY  05/2016   4 dental implants by Dr. Dorthula.   ERCP  2010    CBD stone extraction    ESOPHAGOGASTRODUODENOSCOPY  01/08/2012   Procedure: ESOPHAGOGASTRODUODENOSCOPY (EGD);  Surgeon: Lupita FORBES Commander, MD;  Location: [facility];  Service: Endoscopy;  Laterality: N/A;   ESOPHAGOGASTRODUODENOSCOPY (EGD) WITH ESOPHAGEAL DILATION  2010, 2012   LAPAROSCOPIC APPENDECTOMY N/A 08/04/2016   Procedure: APPENDECTOMY LAPAROSCOPIC;  Surgeon: Elspeth Schultze, MD;  Location: [facility];  Service: General;  Laterality: N/A;   LIPOSUCTION WITH LIPOFILLING Left 11/21/2017    Procedure: LIPOSUCTION FROM ABDOMEN WITH LIPOFILLING TO LEFT BREAST;  Surgeon: Lowery Estefana RAMAN, DO;  Location: [facility];  Service: Plastics;  Laterality: Left;   MASTECTOMY MODIFIED RADICAL Right 05/27/2007   , Mastectomy modified radical (08), breast reconstruction, CA lesions excised lateral abd wall 2010   MASTOPEXY Left 11/21/2017   Procedure: LEFT BREAST REVISION MASTOPEXY FOR SYMMETRY;  Surgeon: Lowery Estefana RAMAN, DO;  Location: [facility];  Service: Plastics;  Laterality: Left;   REDUCTION MAMMAPLASTY Left    SAVORY DILATION  01/08/2012   Procedure: SAVORY DILATION;  Surgeon: Lupita FORBES Commander, MD;  Location: [facility];  Service: Endoscopy;  Laterality: N/A;  need xray     Past OB/GYN History: OB History  No obstetric history on file.    Vaginal deliveries: ***,  Forceps/ Vacuum deliveries: ***, Cesarean section: *** Menopausal: {menopausal:24763} Contraception: ***. Last pap smear was ***.  Any history of abnormal pap smears: {yes/no:19897}. No results found for: DIAGPAP, HPVHIGH, ADEQPAP  Medications: Patient has a current medication list which includes the following prescription(s): acetaminophen , amitriptyline , aspirin  ec, biotin, lumify, vitamin d3, cyanocobalamin , diltiazem ,  fluoxetine , folic acid , lorazepam , midodrine , pantoprazole , potassium chloride , pyridoxine hcl, and thiamine  hcl.   Allergies: Patient is allergic to sorbitol; tomato; zofran ; advil  [ibuprofen ]; cephalexin ; diphenhydramine  hcl; morphine  and codeine; tramadol ; antihistamines, chlorpheniramine-type; and oxycodone .   Social History: Social History[1]  Relationship status: {relationship status:24764} Patient lives with ***.   Patient {ACTION; IS/IS WNU:78978602} employed ***. Regular exercise: {Yes/No:304960894} History of abuse: {Yes/No:304960894}  Family History:   Family History  Problem Relation Age of Onset   Other Mother        tic [place]    Heart disease Father    Lung cancer Father        smoker   Cirrhosis Sister        Primary Biliary   Anxiety disorder Sister    Osteoporosis Sister    Arthritis Sister        Rheumatoid   Osteoporosis Sister    Skin cancer Sister        multiple skin cancers, over 90 excisions.   Stroke Maternal Grandmother    Alcohol abuse Maternal Grandfather    Heart disease Paternal Grandfather    Esophageal cancer Paternal Grandfather    Rectal cancer Paternal Grandfather    Breast cancer Maternal Aunt        dx in her 41s   Breast cancer Maternal Aunt        dx in her 30s   Breast cancer Maternal Aunt        possible breast cancer dx and died in her 26s   Ovarian cancer Maternal Aunt 29   Pancreatic cancer Maternal Aunt        dx in her 70s   Pancreatic cancer Maternal Uncle        dx in his 25s; smoker   Stomach cancer Maternal Uncle    Cancer Maternal Uncle    Breast cancer Paternal Aunt        dx in her 55s   Lung cancer Paternal Aunt    Breast cancer Cousin        paternal first cousin   Breast cancer Cousin        maternal first cousin   Anesthesia problems Neg Hx    Hypotension Neg Hx    Malignant hyperthermia Neg Hx    Pseudochol deficiency Neg Hx      Review of Systems: ROS   OBJECTIVE Physical Exam: There were no vitals filed for this visit.  Physical Exam   GU / Detailed Urogynecologic Evaluation:  Pelvic Exam: Normal external female genitalia; Bartholin's and Skene's glands normal in appearance; urethral meatus normal in appearance, no urethral masses or discharge.   CST: {gen negative/positive:315881}  Reflexes: bulbocavernosis {DESC; PRESENT/NOT PRESENT:21021351}, anocutaneous {DESC; PRESENT/NOT PRESENT:21021351} ***bilaterally.  Speculum exam reveals normal vaginal mucosa {With/Without:20273} atrophy. Cervix {exam; gyn cervix:30847}. Uterus {exam; pelvic uterus:30849}. Adnexa {exam; adnexa:12223}.    s/p hysterectomy: Speculum exam reveals normal  vaginal mucosa {With/Without:20273}  atrophy and normal vaginal cuff.  Adnexa {exam; adnexa:12223}.    With apex supported, anterior compartment defect was {reduced:24765}  Pelvic floor strength {Roman # I-V:19040}/V, puborectalis {Roman # I-V:19040}/V external anal sphincter {Roman # I-V:19040}/V  Pelvic floor musculature: Right levator {Tender/Non-tender:20250}, Right obturator {Tender/Non-tender:20250}, Left levator {Tender/Non-tender:20250}, Left obturator {Tender/Non-tender:20250}  POP-Q:   POP-Q  Aa                                               Ba                                                 C                                                Gh                                               Pb                                               tvl                                                Ap                                               Bp                                                 D      Rectal Exam:  Normal sphincter tone, {rectocele:24766} distal rectocele, enterocoele {DESC; PRESENT/NOT PRESENT:21021351}, no rectal masses, {sign of:24767} dyssynergia when asking the patient to bear down.  Post-Void Residual (PVR) by Bladder Scan: In order to evaluate bladder emptying, we discussed obtaining a postvoid residual and patient agreed to this procedure.  Procedure: The ultrasound unit was placed on the patient's abdomen in the suprapubic region after the patient had voided.      Laboratory Results: Lab Results  Component Value Date   COLORU Yellow 11/07/2023   CLARITYU clear 03/08/2022   GLUCOSEUR Negative 03/08/2022   BILIRUBINUR NEGATIVE 03/12/2024   KETONESU Negative 11/07/2023   SPECGRAV 1.010 11/07/2023   RBCUR negative 03/08/2022   PHUR 6.5 11/07/2023   PROTEINUR Negative 11/07/2023   UROBILINOGEN 0.2 03/12/2024   LEUKOCYTESUR LARGE (A) 03/12/2024    Lab Results  Component Value Date    CREATININE 0.58 10/05/2024   CREATININE 0.60 09/23/2024   CREATININE 0.65 08/11/2024    Lab Results  Component Value Date   HGBA1C 6.0 04/04/2023    Lab Results  Component Value Date   HGB 12.0 09/23/2024     ASSESSMENT AND PLAN Ms. Brees is a 83 y.o. with: No diagnosis found.  There  are no diagnoses linked to this encounter.   Lianne ONEIDA Gillis, MD        [1]  Social History Tobacco Use   Smoking status: Former    Current packs/day: 0.00    Average packs/day: 1 pack/day for 50.0 years (50.0 ttl pk-yrs)    Types: Cigarettes    Start date: 05/21/1957    Quit date: 05/22/2007    Years since quitting: 17.4   Smokeless tobacco: Never  Vaping Use   Vaping status: Never Used  Substance Use Topics   Alcohol use: Not Currently   Drug use: No   "

## 2024-11-10 ENCOUNTER — Emergency Department (HOSPITAL_BASED_OUTPATIENT_CLINIC_OR_DEPARTMENT_OTHER): Admitting: Radiology

## 2024-11-10 ENCOUNTER — Other Ambulatory Visit: Payer: Self-pay

## 2024-11-10 ENCOUNTER — Emergency Department (HOSPITAL_BASED_OUTPATIENT_CLINIC_OR_DEPARTMENT_OTHER)

## 2024-11-10 ENCOUNTER — Ambulatory Visit: Payer: Self-pay

## 2024-11-10 ENCOUNTER — Inpatient Hospital Stay (HOSPITAL_BASED_OUTPATIENT_CLINIC_OR_DEPARTMENT_OTHER)
Admission: EM | Admit: 2024-11-10 | Discharge: 2024-12-01 | DRG: 871 | Disposition: A | Source: Ambulatory Visit | Attending: Family Medicine | Admitting: Family Medicine

## 2024-11-10 DIAGNOSIS — Z885 Allergy status to narcotic agent status: Secondary | ICD-10-CM

## 2024-11-10 DIAGNOSIS — C50919 Malignant neoplasm of unspecified site of unspecified female breast: Secondary | ICD-10-CM | POA: Diagnosis present

## 2024-11-10 DIAGNOSIS — Z808 Family history of malignant neoplasm of other organs or systems: Secondary | ICD-10-CM

## 2024-11-10 DIAGNOSIS — Z853 Personal history of malignant neoplasm of breast: Secondary | ICD-10-CM

## 2024-11-10 DIAGNOSIS — E782 Mixed hyperlipidemia: Secondary | ICD-10-CM | POA: Diagnosis present

## 2024-11-10 DIAGNOSIS — Z800 Family history of malignant neoplasm of digestive organs: Secondary | ICD-10-CM

## 2024-11-10 DIAGNOSIS — Z886 Allergy status to analgesic agent status: Secondary | ICD-10-CM

## 2024-11-10 DIAGNOSIS — C7981 Secondary malignant neoplasm of breast: Secondary | ICD-10-CM

## 2024-11-10 DIAGNOSIS — T380X5A Adverse effect of glucocorticoids and synthetic analogues, initial encounter: Secondary | ICD-10-CM | POA: Diagnosis present

## 2024-11-10 DIAGNOSIS — J4520 Mild intermittent asthma, uncomplicated: Secondary | ICD-10-CM

## 2024-11-10 DIAGNOSIS — Z811 Family history of alcohol abuse and dependence: Secondary | ICD-10-CM

## 2024-11-10 DIAGNOSIS — J984 Other disorders of lung: Secondary | ICD-10-CM | POA: Diagnosis not present

## 2024-11-10 DIAGNOSIS — J188 Other pneumonia, unspecified organism: Principal | ICD-10-CM

## 2024-11-10 DIAGNOSIS — Z801 Family history of malignant neoplasm of trachea, bronchus and lung: Secondary | ICD-10-CM

## 2024-11-10 DIAGNOSIS — Z91018 Allergy to other foods: Secondary | ICD-10-CM

## 2024-11-10 DIAGNOSIS — Z8249 Family history of ischemic heart disease and other diseases of the circulatory system: Secondary | ICD-10-CM

## 2024-11-10 DIAGNOSIS — Z803 Family history of malignant neoplasm of breast: Secondary | ICD-10-CM

## 2024-11-10 DIAGNOSIS — Z87891 Personal history of nicotine dependence: Secondary | ICD-10-CM

## 2024-11-10 DIAGNOSIS — J440 Chronic obstructive pulmonary disease with acute lower respiratory infection: Secondary | ICD-10-CM | POA: Diagnosis present

## 2024-11-10 DIAGNOSIS — Z823 Family history of stroke: Secondary | ICD-10-CM

## 2024-11-10 DIAGNOSIS — E876 Hypokalemia: Secondary | ICD-10-CM | POA: Diagnosis present

## 2024-11-10 DIAGNOSIS — Z7982 Long term (current) use of aspirin: Secondary | ICD-10-CM

## 2024-11-10 DIAGNOSIS — I501 Left ventricular failure: Secondary | ICD-10-CM | POA: Diagnosis present

## 2024-11-10 DIAGNOSIS — J9601 Acute respiratory failure with hypoxia: Secondary | ICD-10-CM | POA: Diagnosis present

## 2024-11-10 DIAGNOSIS — J439 Emphysema, unspecified: Secondary | ICD-10-CM | POA: Diagnosis present

## 2024-11-10 DIAGNOSIS — D75838 Other thrombocytosis: Secondary | ICD-10-CM | POA: Diagnosis not present

## 2024-11-10 DIAGNOSIS — F05 Delirium due to known physiological condition: Secondary | ICD-10-CM | POA: Diagnosis present

## 2024-11-10 DIAGNOSIS — M810 Age-related osteoporosis without current pathological fracture: Secondary | ICD-10-CM | POA: Diagnosis present

## 2024-11-10 DIAGNOSIS — A419 Sepsis, unspecified organism: Principal | ICD-10-CM | POA: Diagnosis present

## 2024-11-10 DIAGNOSIS — Z818 Family history of other mental and behavioral disorders: Secondary | ICD-10-CM

## 2024-11-10 DIAGNOSIS — I110 Hypertensive heart disease with heart failure: Secondary | ICD-10-CM | POA: Diagnosis present

## 2024-11-10 DIAGNOSIS — C7800 Secondary malignant neoplasm of unspecified lung: Secondary | ICD-10-CM | POA: Diagnosis present

## 2024-11-10 DIAGNOSIS — I10 Essential (primary) hypertension: Secondary | ICD-10-CM

## 2024-11-10 DIAGNOSIS — Z8041 Family history of malignant neoplasm of ovary: Secondary | ICD-10-CM

## 2024-11-10 DIAGNOSIS — J449 Chronic obstructive pulmonary disease, unspecified: Secondary | ICD-10-CM | POA: Insufficient documentation

## 2024-11-10 DIAGNOSIS — J810 Acute pulmonary edema: Secondary | ICD-10-CM | POA: Insufficient documentation

## 2024-11-10 DIAGNOSIS — R6510 Systemic inflammatory response syndrome (SIRS) of non-infectious origin without acute organ dysfunction: Secondary | ICD-10-CM

## 2024-11-10 DIAGNOSIS — G9341 Metabolic encephalopathy: Secondary | ICD-10-CM | POA: Diagnosis present

## 2024-11-10 DIAGNOSIS — Z881 Allergy status to other antibiotic agents status: Secondary | ICD-10-CM

## 2024-11-10 DIAGNOSIS — F03A18 Unspecified dementia, mild, with other behavioral disturbance: Secondary | ICD-10-CM | POA: Diagnosis present

## 2024-11-10 DIAGNOSIS — Z923 Personal history of irradiation: Secondary | ICD-10-CM

## 2024-11-10 DIAGNOSIS — Z8262 Family history of osteoporosis: Secondary | ICD-10-CM

## 2024-11-10 DIAGNOSIS — M1990 Unspecified osteoarthritis, unspecified site: Secondary | ICD-10-CM | POA: Diagnosis present

## 2024-11-10 DIAGNOSIS — Z7951 Long term (current) use of inhaled steroids: Secondary | ICD-10-CM

## 2024-11-10 DIAGNOSIS — D638 Anemia in other chronic diseases classified elsewhere: Secondary | ICD-10-CM | POA: Diagnosis present

## 2024-11-10 DIAGNOSIS — D72829 Elevated white blood cell count, unspecified: Secondary | ICD-10-CM

## 2024-11-10 DIAGNOSIS — Z9049 Acquired absence of other specified parts of digestive tract: Secondary | ICD-10-CM

## 2024-11-10 DIAGNOSIS — Z8261 Family history of arthritis: Secondary | ICD-10-CM

## 2024-11-10 DIAGNOSIS — J180 Bronchopneumonia, unspecified organism: Secondary | ICD-10-CM | POA: Diagnosis present

## 2024-11-10 DIAGNOSIS — Z79899 Other long term (current) drug therapy: Secondary | ICD-10-CM

## 2024-11-10 DIAGNOSIS — J189 Pneumonia, unspecified organism: Secondary | ICD-10-CM | POA: Diagnosis present

## 2024-11-10 DIAGNOSIS — F32A Depression, unspecified: Secondary | ICD-10-CM | POA: Diagnosis present

## 2024-11-10 DIAGNOSIS — K5900 Constipation, unspecified: Secondary | ICD-10-CM | POA: Diagnosis not present

## 2024-11-10 DIAGNOSIS — Z9011 Acquired absence of right breast and nipple: Secondary | ICD-10-CM

## 2024-11-10 DIAGNOSIS — Z1152 Encounter for screening for COVID-19: Secondary | ICD-10-CM

## 2024-11-10 DIAGNOSIS — F1021 Alcohol dependence, in remission: Secondary | ICD-10-CM | POA: Diagnosis present

## 2024-11-10 LAB — CBC
HCT: 34.2 % — ABNORMAL LOW (ref 36.0–46.0)
Hemoglobin: 11.3 g/dL — ABNORMAL LOW (ref 12.0–15.0)
MCH: 30.8 pg (ref 26.0–34.0)
MCHC: 33 g/dL (ref 30.0–36.0)
MCV: 93.2 fL (ref 80.0–100.0)
Platelets: 294 K/uL (ref 150–400)
RBC: 3.67 MIL/uL — ABNORMAL LOW (ref 3.87–5.11)
RDW: 13.8 % (ref 11.5–15.5)
WBC: 11.7 K/uL — ABNORMAL HIGH (ref 4.0–10.5)
nRBC: 0 % (ref 0.0–0.2)

## 2024-11-10 LAB — BASIC METABOLIC PANEL WITH GFR
Anion gap: 13 (ref 5–15)
BUN: 16 mg/dL (ref 8–23)
CO2: 24 mmol/L (ref 22–32)
Calcium: 9.3 mg/dL (ref 8.9–10.3)
Chloride: 100 mmol/L (ref 98–111)
Creatinine, Ser: 0.62 mg/dL (ref 0.44–1.00)
GFR, Estimated: >60 mL/min
Glucose, Bld: 90 mg/dL (ref 70–99)
Potassium: 4 mmol/L (ref 3.5–5.1)
Sodium: 137 mmol/L (ref 135–145)

## 2024-11-10 LAB — RESP PANEL BY RT-PCR (RSV, FLU A&B, COVID)  RVPGX2
Influenza A by PCR: NEGATIVE
Influenza B by PCR: NEGATIVE
Resp Syncytial Virus by PCR: NEGATIVE
SARS Coronavirus 2 by RT PCR: NEGATIVE

## 2024-11-10 LAB — LACTIC ACID, PLASMA: Lactic Acid, Venous: 1.4 mmol/L (ref 0.5–1.9)

## 2024-11-10 LAB — PRO BRAIN NATRIURETIC PEPTIDE: Pro Brain Natriuretic Peptide: 520 pg/mL — ABNORMAL HIGH

## 2024-11-10 LAB — TROPONIN T, HIGH SENSITIVITY: Troponin T High Sensitivity: <15 ng/L (ref 0–19)

## 2024-11-10 MED ORDER — ACETAMINOPHEN 650 MG RE SUPP
650.0000 mg | Freq: Once | RECTAL | Status: AC
  Administered 2024-11-10: 650 mg via RECTAL
  Filled 2024-11-10: qty 1

## 2024-11-10 MED ORDER — SODIUM CHLORIDE 0.9 % IV SOLN
2.0000 g | Freq: Once | INTRAVENOUS | Status: AC
  Administered 2024-11-10: 2 g via INTRAVENOUS
  Filled 2024-11-10: qty 12.5

## 2024-11-10 MED ORDER — KETOROLAC TROMETHAMINE 15 MG/ML IJ SOLN
15.0000 mg | Freq: Once | INTRAMUSCULAR | Status: AC
  Administered 2024-11-10: 15 mg via INTRAVENOUS
  Filled 2024-11-10: qty 1

## 2024-11-10 MED ORDER — SODIUM CHLORIDE 0.9 % IV BOLUS
500.0000 mL | Freq: Once | INTRAVENOUS | Status: AC
  Administered 2024-11-10: 500 mL via INTRAVENOUS

## 2024-11-10 MED ORDER — IOHEXOL 350 MG/ML SOLN
75.0000 mL | Freq: Once | INTRAVENOUS | Status: AC | PRN
  Administered 2024-11-10: 100 mL via INTRAVENOUS

## 2024-11-10 MED ORDER — VANCOMYCIN HCL IN DEXTROSE 1-5 GM/200ML-% IV SOLN
1000.0000 mg | Freq: Once | INTRAVENOUS | Status: AC
  Administered 2024-11-10: 1000 mg via INTRAVENOUS
  Filled 2024-11-10: qty 200

## 2024-11-10 NOTE — Progress Notes (Signed)
 Plan of Care Note for accepted transfer   Patient: Anne [name] MRN: [id number]   DOA: 11/10/2024  Facility requesting transfer: [facility]   Requesting Provider: Rosebud Fass, PA   Reason for transfer: Pneumonia   Facility course: 83 yr old female with dementia, COPD, and metastatic breast cancer presenting with cough and chest pain.   She had been seen yesterday and prescribed doxycycline  and prednisone . She is saturating in the 80s on room air. COVID/Flu/RSV are negative. WBC is 11.7 and lactate normal. CTA chest is concerning for multifocal bronchopneumonia.   She was treated with IVF, vancomycin , and cefepime .   Plan of care: The patient is accepted for admission to Telemetry unit, at [facility].   Author: Evalene GORMAN Sprinkles, MD 11/10/2024  Check [email or web address] for on-call coverage.  Nursing staff, Please call [facility] Admits & Consults System-Wide number on Amion as soon as patient's arrival, so appropriate admitting provider can evaluate the pt.

## 2024-11-10 NOTE — ED Notes (Signed)
 Extra blue top sent to lab to hold.

## 2024-11-10 NOTE — ED Triage Notes (Signed)
 Patient reports left sided chest pain with rattling cough for 3 days. History of breast cancer that has spread to lung. Was seen at urgent care and prescribed doxycycline  and prednisone . Family concerned about possible interactions with chemo medications. Sent by PCP to rule out PE.

## 2024-11-10 NOTE — Telephone Encounter (Signed)
 FYI Only or Action Required?: Action required by provider: clinical question for provider and Advised ED.SABRA  Patient and spouse also wondering about newly ordered medications and potential interactions with other meds.  Recently prescribed Prednisone  and Doxycycline .   Can't swallow 100 mg Doxycycline  capsules, too large.  Has been able to take 50 mg capsules in past.    Awaiting callback from [facility] pharmacist regarding interaction questions but would also like any input you can provide.       Patient was last seen in primary care on 08/19/2024 by Wheeler Harlene CROME, NP.  Called Nurse Triage reporting Chest Pain. - with cough and deep breathing  Symptoms began several days ago.  Interventions attempted: OTC medications: Tylenol , Robitussin, Prescription medications: Prednisone , Doxycycline , and Rest, hydration, or home remedies.  Symptoms are: stable.  Triage Disposition: Go to ED Now (or PCP Triage)  Patient/caregiver understands and will follow disposition?: Yes     Copied from CRM #[id number]. Topic: Clinical - Red Word Triage >> Nov 10, 2024  1:06 PM Amy B wrote: Red Word that prompted transfer to Nurse Triage: Pain in right chest, rattling and congestion.  Patient is a cancer patient.     Reason for Disposition  Taking a deep breath makes pain worse    Patient reports no imaging done at Urgent Care.  Additional Information  Negative: Long-distance travel in past month (e.g., car, bus, train, plane; with trip lasting 6 or more hours)    Positive- Recently traveled to [place], but was seen in Urgent Care yesterday for this matter.  Negative: Cancer treatment in past six months (or has cancer now)    Positive- But was recently seen in Urgent Care yesterday for this matter.  Answer Assessment - Initial Assessment Questions Both husband and patient available to answer questions during call on speaker phone.    1. LOCATION: Where does it hurt?       Chest area where cancer  is, when coughs there is a rattling congestion, cough is non-productive, sharp pains when coughing but also when taking deep breaths.  Located in right lung area where cancer is.   2. RADIATION: Does the pain go anywhere else? (e.g., into neck, jaw, arms, back)     Denies  3. ONSET: When did the chest pain begin? (Minutes, hours or days)      Sharp when coughing and taking deep breaths; couple days ago  4. PATTERN: Does the pain come and go, or has it been constant since it started?  Does it get worse with exertion?      Only when coughing or taking deep breaths  5. DURATION: How long does it last (e.g., seconds, minutes, hours)     there and gone  6. SEVERITY: How bad is the pain?  (e.g., Scale 1-10; mild, moderate, or severe)     10/10 with cough;  also having when taking deep breaths; feels short of breath when coughing breathless, then can breathe normally after cough ceases  7. CARDIAC RISK FACTORS: Do you have any history of heart problems or risk factors for heart disease? (e.g., angina, prior heart attack; diabetes, high blood pressure, high cholesterol, smoker, or strong family history of heart disease)     Carotid artery stenosis  8. PULMONARY RISK FACTORS: Do you have any history of lung disease?  (e.g., blood clots in lung, asthma, emphysema, birth control pills)     Lung cancer, asthma and emphysema, uses inhaler, using more often, not sure if helping  or not  9. CAUSE: What do you think is causing the chest pain?     Recently traveled to [place], was seen in Urgent Care yesterday and medications prescribed. Also has history of lung cancer.   10. OTHER SYMPTOMS: Do you have any other symptoms? (e.g., dizziness, nausea, vomiting, sweating, fever, difficulty breathing, cough)       Denies fever or chills, also having right hip pain for several days on/off (not all the time, has had in past, not sure of cause, wonders if cancer spreading). History of neuropathy  right leg.   ED a couple days ago at [facility]- 4 hour wait, left and was not seen.  Urgent Care yesterday, saw provider- ordered Prednisone  regimen, doxycycline - can't take 100 mg capsule due to swallowing problem (size of capsule)- has taken 50mg s in the past- could take smaller capsule.   Initially calls to ask about interactions with new medications and other medications. Awaiting [facility] pharmacist to call back. Has seen Oncologist since 2021, at [facility].  Also taking Tylenol  and Robitussin- not helping  Overall reports feeling better today, was very fatigued a few days ago.  But still having cough, pain, congestion in chest.   Denies seeing any blood or redness to sputum/phlegm.  Cough non-productive.  Protocols used: Chest Pain-A-AH

## 2024-11-10 NOTE — ED Notes (Addendum)
 Patient ambulated to restroom with assistance. Ambulated with pulse oximetry per PA request. SpO2 decreased to 78-79% while ambulating. Patient placed on [facility]. SpO2 97% with oxygen in place.

## 2024-11-10 NOTE — ED Notes (Signed)
 Patient transported to CT

## 2024-11-10 NOTE — ED Notes (Signed)
 Due to poor veins selection and only able to use left arm blood cultures were unable to be obtained.  Second lactic was unable to be drawn as well.

## 2024-11-10 NOTE — ED Notes (Signed)
 Patient removed from oxygen at this time. SpO2 96% resting in bed.

## 2024-11-10 NOTE — ED Provider Notes (Signed)
 " [place] EMERGENCY DEPARTMENT AT [facility] Provider Note   CSN: [id number] Arrival date & time: 11/10/24  1652    Patient presents with: Chest Pain   Anne [name] is a 83 y.o. female with known metastatic breast cancer followed by [facility] here for evaluation of cough and chest pain. Cough x 10 days. Pain to right breast, worse with cough.  Seen by urgent care yesterday prescribed doxycycline  prednisone  however was unable to tolerate the doxycycline .  She called her primary care provider who is worried for clot.  She does admit to a recent trip to [place] .  She had some diarrhea last week after she took her chemotherapy which is typical for her.  Took Imodium , resolved.  No abdominal pain.  No dysuria or hematuria.  No pain or swelling to lower legs.  She is not anticoagulated.  Some chills without documented fever.  Family thought she was dehydrated over the weekend, told her to increase her fluids which helped with her generalized weakness.   Capivasertib 200 mg BID 4 days on M-Thursday     HPI     Prior to Admission medications  Medication Sig Start Date End Date Taking? Authorizing Provider  acetaminophen  (TYLENOL ) 500 MG tablet Take 500-1,000 mg by mouth every 6 (six) hours as needed for moderate pain.    [provider]  amitriptyline  (ELAVIL ) 25 MG tablet Take 4 tablets (100 mg total) by mouth at bedtime. 09/07/24   Domenica Harlene LABOR, MD  aspirin  81 MG EC tablet Take 81 mg by mouth daily.    [provider]  Biotin 5 MG CAPS Take 5 mg by mouth daily.    [provider]  Brimonidine Tartrate (LUMIFY) 0.025 % SOLN Place 1 drop into both eyes daily.    [provider]  Cholecalciferol (VITAMIN D3) 1000 UNITS CAPS Take 2,000 Units by mouth daily.    [provider]  Cyanocobalamin  (VITAMIN B 12 PO) Take 2 tablets by mouth daily.    [provider]  diltiazem  (CARDIZEM ) 30 MG tablet As needed 08/27/23   Johnson, Kathleen  R, PA-C  doxycycline  (VIBRAMYCIN ) 100 MG capsule Take 1 capsule (100 mg total) by mouth 2 (two) times daily for 10 days. 11/09/24 11/19/24  Teddy Sharper, FNP  FLUoxetine  (PROZAC ) 20 MG capsule Take 3 capsules (60 mg total) by mouth daily. 03/18/24   Domenica Harlene LABOR, MD  Folic Acid  (FOLATE PO) Take 1 tablet by mouth daily.    [provider]  LORazepam  (ATIVAN ) 1 MG tablet TAKE 1 TABLET(1 MG) BY MOUTH EVERY 8 HOURS AS NEEDED FOR ANXIETY 10/19/24   Webb, Padonda B, FNP  midodrine  (PROAMATINE ) 2.5 MG tablet Take 1 tablet (2.5 mg total) by mouth 3 (three) times daily with meals. (1st dose upon waking around 7:00 or 8:00 AM, 2nd dose at 11:00 AM or 12:00 PM, 3rd dose at 2:00 or 3:00 PM) Patient not taking: Reported on 10/05/2024 09/04/24   Okey Vina GAILS, MD  pantoprazole  (PROTONIX ) 40 MG tablet TAKE 1 TABLET(40 MG) BY MOUTH TWICE DAILY BEFORE A MEAL 11/02/24   Avram Lupita BRAVO, MD  potassium chloride  (KLOR-CON ) 20 MEQ packet Take 40 mEq by mouth 2 (two) times daily for 7 days. Patient not taking: Reported on 10/05/2024 09/23/24 09/30/24  Mesner, Selinda, MD  predniSONE  (STERAPRED UNI-PAK 21 TAB) 10 MG (21) TBPK tablet Take by mouth daily. Take 6 tabs by mouth daily  for 2 days, then 5 tabs for 2 days,  then 4 tabs for 2 days, then 3 tabs for 2 days, 2 tabs for 2 days, then 1 tab by mouth daily for 2 days 11/09/24   Teddy Sharper, FNP  Pyridoxine HCl (B-6 PO) Take 1 tablet by mouth daily.    [provider]  Thiamine  HCl (B-1 PO) Take 1 tablet by mouth daily.    [provider]    Allergies: Sorbitol; Tomato; Zofran ; Advil  [ibuprofen ]; Cephalexin ; Diphenhydramine  hcl; Morphine  and codeine; Tramadol ; Antihistamines, chlorpheniramine-type; and Oxycodone     Review of Systems  Constitutional: Negative.   HENT: Negative.    Respiratory:  Positive for cough. Negative for shortness of breath, wheezing and stridor.   Cardiovascular:  Positive for chest pain. Negative for palpitations and  leg swelling.  Gastrointestinal:  Positive for diarrhea (chronic, resolved). Negative for abdominal distention, abdominal pain, anal bleeding, blood in stool, constipation, nausea, rectal pain and vomiting.  Genitourinary: Negative.   Musculoskeletal: Negative.   Skin: Negative.   Neurological:  Positive for weakness (generalized).  All other systems reviewed and are negative.   Updated Vital Signs BP (!) 112/52 (BP Location: Left Arm)   Pulse 82   Temp (!) 102.6 F (39.2 C) (Oral)   Resp 18   SpO2 94%   Physical Exam Vitals and nursing note reviewed.  Constitutional:      General: She is not in acute distress.    Appearance: She is well-developed. She is not ill-appearing, toxic-appearing or diaphoretic.  HENT:     Head: Normocephalic and atraumatic.  Eyes:     Pupils: Pupils are equal, round, and reactive to light.  Cardiovascular:     Rate and Rhythm: Normal rate and regular rhythm.     Pulses:          Radial pulses are 2+ on the right side and 2+ on the left side.       Posterior tibial pulses are 2+ on the right side and 2+ on the left side.     Heart sounds: Normal heart sounds.  Pulmonary:     Effort: Pulmonary effort is normal. No respiratory distress.     Breath sounds: Normal breath sounds.     Comments: Speaks in full sentences without difficulty.  No wheeze or rhonchi Chest:     Comments: Tenderness anterior chest wall without overlying crepitus Abdominal:     General: Bowel sounds are normal. There is no distension.     Palpations: Abdomen is soft.     Comments: Soft, nontender  Musculoskeletal:        General: Normal range of motion.     Cervical back: Normal range of motion and neck supple.     Right lower leg: No tenderness. No edema.     Left lower leg: No tenderness. No edema.     Comments: No bony tenderness, compartments soft, full range of motion  Skin:    General: Skin is warm and dry.  Neurological:     General: No focal deficit present.      Mental Status: She is alert and oriented to person, place, and time.     (all labs ordered are listed, but only abnormal results are displayed) Labs Reviewed  CBC - Abnormal; Notable for the following components:      Result Value   WBC 11.7 (*)    RBC 3.67 (*)    Hemoglobin 11.3 (*)    HCT 34.2 (*)    All other components within normal limits  PRO BRAIN  NATRIURETIC PEPTIDE - Abnormal; Notable for the following components:   Pro Brain Natriuretic Peptide 520.0 (*)    All other components within normal limits  RESP PANEL BY RT-PCR (RSV, FLU A&B, COVID)  RVPGX2  CULTURE, BLOOD (ROUTINE X 2)  CULTURE, BLOOD (ROUTINE X 2)  BASIC METABOLIC PANEL WITH GFR  LACTIC ACID, PLASMA  LACTIC ACID, PLASMA  TROPONIN T, HIGH SENSITIVITY  TROPONIN T, HIGH SENSITIVITY    EKG: EKG Interpretation Date/Time:  Tuesday November 10 2024 17:09:15 EST Ventricular Rate:  97 PR Interval:  146 QRS Duration:  132 QT Interval:  396 QTC Calculation: 502 R Axis:   45  Text Interpretation: Normal sinus rhythm Non-specific intra-ventricular conduction block Cannot rule out Anterior infarct , age undetermined  Copmpared with prior EKG from 09/23/2024 Confirmed by Anne [name] ([id number]) on 11/10/2024 5:12:04 PM  Radiology: CT Angio Chest PE W and/or Wo Contrast Result Date: 11/10/2024 EXAM: CTA CHEST 11/10/2024 06:08:10 PM TECHNIQUE: CTA of the chest was performed after the administration of intravenous contrast. Multiplanar reformatted images are provided for review. MIP images are provided for review. Automated exposure control, iterative reconstruction, and/or weight based adjustment of the mA/kV was utilized to reduce the radiation dose to as low as reasonably achievable. COMPARISON: None available. CLINICAL HISTORY: Pulmonary embolism (PE) suspected, high prob; ca, pleuritic chest pain, recent travel. FINDINGS: PULMONARY ARTERIES: Pulmonary arteries are adequately opacified for evaluation. The central  pulmonary arteries are enlarged in keeping with changes of pulmonary arterial hypertension. No pulmonary embolism. MEDIASTINUM: Moderate multivessel coronary artery calcification. Clinical cardiac size within normal limits. No pericardial effusion. Mild atherosclerotic calcification within the thoracic aorta. No aortic aneurysm. LYMPH NODES: Pathologic right hilar adenopathy noted with the index lymph node measuring 12 mm in short axis diameter. No mediastinal or axillary lymphadenopathy. LUNGS AND PLEURA: Moderate emphysema. Multifocal consolidation within the right apex and left lower lobe with associated airway inflammation compatible with multifocal bronchopneumonia. Infiltrate within the left lower lobe is particularly nodular and follow up CT imaging in 3 months is recommended to document resolution can exclude the presence of an underlying mass. No pneumothorax or pleural effusion. UPPER ABDOMEN: Limited images of the upper abdomen are unremarkable. SOFT TISSUES AND BONES: Surgical changes of the right mastectomy and implant reconstruction are noted. IMPRESSION: 1. No pulmonary embolism. 2. Multifocal bronchopneumonia involving the right apex and left lower lobe, with the left lower lobe infiltrate being particularly nodular; follow-up CT in 3 months is recommended to document resolution and exclude an underlying mass. 3. Pathologic right hilar adenopathy measuring 12 mm in short axis. While this may be simply reactive, close attention on follow-up imaging is warranted to document resolution. 4. Enlarged central pulmonary arteries consistent with pulmonary arterial hypertension. 5. Moderate emphysema. 6. Moderate multivessel coronary artery calcification. 7. Postsurgical changes of right mastectomy with implant reconstruction. Electronically signed by: Dorethia Molt MD 11/10/2024 07:41 PM EST RP Workstation: [id number]   DG Chest 2 View Result Date: 11/10/2024 EXAM: 2 VIEW(S) XRAY OF THE CHEST 11/10/2024  05:19:00 PM COMPARISON: 06/22/2024 CLINICAL HISTORY: chest pain FINDINGS: LUNGS AND PLEURA: Medial right upper lobe patchy opacities have increased. There are also new patchy opacities in the left lung base. No pleural effusion. No pneumothorax. HEART AND MEDIASTINUM: No acute abnormality of the cardiac and mediastinal silhouettes. BONES AND SOFT TISSUES: Surgical clips in right axilla. No acute osseous abnormality. IMPRESSION: 1. Increased medial right upper lobe patchy opacities and new patchy opacities in the left lung base concerning for multifocal  infection. Recommend follow-up PA and lateral chest x-ray in 4 to 6 weeks to confirm resolution. Electronically signed by: Greig Pique MD 11/10/2024 06:23 PM EST RP Workstation: [id number]     .Critical Care  Performed by: Anne Rosebud LABOR, PA-C Authorized by: Anne Rosebud LABOR, PA-C   Critical care provider statement:    Critical care time (minutes):  30   Critical care was necessary to treat or prevent imminent or life-threatening deterioration of the following conditions:  Respiratory failure   Critical care was time spent personally by me on the following activities:  Development of treatment plan with patient or surrogate, discussions with consultants, evaluation of patient's response to treatment, examination of patient, ordering and review of laboratory studies, ordering and review of radiographic studies, ordering and performing treatments and interventions, pulse oximetry, re-evaluation of patient's condition and review of old charts    Medications Ordered in the ED  vancomycin  (VANCOCIN ) IVPB 1000 mg/200 mL premix (has no administration in time range)  ceFEPIme  (MAXIPIME ) 2 g in sodium chloride  0.9 % 100 mL IVPB (2 g Intravenous New Bag/Given 11/10/24 2143)  iohexol  (OMNIPAQUE ) 350 MG/ML injection 75 mL (100 mLs Intravenous Contrast Given 11/10/24 1802)  sodium chloride  0.9 % bolus 500 mL (500 mLs Intravenous New Bag/Given 11/10/24  2147)  acetaminophen  (TYLENOL ) suppository 650 mg (650 mg Rectal Given 11/10/24 1832)    83 year old here for evaluation of cough and pleuritic chest pain.  History of metastatic breast cancer followed by [facility].  Recent travel to [place] .  Seen by urgent care yesterday prescribed doxycycline  and steroids for possible URI.  Did not had any imaging or labs done.  Some diarrhea last week which resolved, thought likely due to her cancer medication as this is typical of this.  No bloody stool.  Recent antibiotics.  Patient without tachycardia, tachypnea or hypoxia.  Will plan on labs, imaging.  Does have an oral temp at 99.5.  Labs and imaging personally viewed and interpreted:  CBC leukocytosis 11.7 Metabolic panel wo acute abnormality Lactic 1.4 Troponin <15 x2 BNP 520 Viral panel neg Chest x-ray multifocal pneumonia CTA chest multifocal pneumonia. NO PE EKG wo ischemic changes  Patient reassessed.  Hypoxic.  Started on antibiotics.  Plan for admission  Discussed results with patient, family at bedside.  On 2 L via nasal cannula.  Given antibiotics for multifocal pneumonia, given risk factors will cover for HCAP.  Did end up developing a fever of 102.6.  Patient does not take oral pills will give suppository Tylenol .  Clinical Course as of 11/10/24 2159  Tue Nov 10, 2024  2035 She desaturated 78% on room air with ambulation.  Placed on 2 L via nasal cannula.  I suspect likely multifocal pneumonia as cause.  Given immunocompromise we will do vancomycin  and cefepime . [BH]  2053 Dr. Charlton with medicine for admission [BH]    Clinical Course User Index [BH] Zaniel Marineau A, PA-C   Discussed with Dr. Charlton with medicine, agreeable to accept patient in transfer for admission.  The patient appears reasonably stabilized for admission considering the current resources, flow, and capabilities available in the ED at this time, and I doubt any other [facility] requiring further screening and/or treatment  in the ED prior to admission.                                  Medical Decision Making Amount and/or Complexity of Data Reviewed  Independent Historian: spouse External Data Reviewed: labs, radiology, ECG and notes. Labs: ordered. Decision-making details documented in ED Course. Radiology: ordered and independent interpretation performed. Decision-making details documented in ED Course. ECG/medicine tests: ordered and independent interpretation performed. Decision-making details documented in ED Course.  Risk OTC drugs. Prescription drug management. Decision regarding hospitalization. Diagnosis or treatment significantly limited by social determinants of health.        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

## 2024-11-10 NOTE — ED Notes (Signed)
 Due to low pulse oz readings at 88% RA Pt was placed on 2 lpm [place].

## 2024-11-11 ENCOUNTER — Encounter (HOSPITAL_COMMUNITY): Payer: Self-pay | Admitting: Family Medicine

## 2024-11-11 DIAGNOSIS — F03A18 Unspecified dementia, mild, with other behavioral disturbance: Secondary | ICD-10-CM | POA: Diagnosis present

## 2024-11-11 DIAGNOSIS — E876 Hypokalemia: Secondary | ICD-10-CM | POA: Diagnosis present

## 2024-11-11 DIAGNOSIS — J701 Chronic and other pulmonary manifestations due to radiation: Secondary | ICD-10-CM | POA: Diagnosis not present

## 2024-11-11 DIAGNOSIS — J189 Pneumonia, unspecified organism: Secondary | ICD-10-CM | POA: Diagnosis present

## 2024-11-11 DIAGNOSIS — I10 Essential (primary) hypertension: Secondary | ICD-10-CM | POA: Diagnosis not present

## 2024-11-11 DIAGNOSIS — F05 Delirium due to known physiological condition: Secondary | ICD-10-CM | POA: Diagnosis present

## 2024-11-11 DIAGNOSIS — J441 Chronic obstructive pulmonary disease with (acute) exacerbation: Secondary | ICD-10-CM | POA: Diagnosis not present

## 2024-11-11 DIAGNOSIS — Z87891 Personal history of nicotine dependence: Secondary | ICD-10-CM | POA: Diagnosis not present

## 2024-11-11 DIAGNOSIS — J9601 Acute respiratory failure with hypoxia: Secondary | ICD-10-CM | POA: Diagnosis present

## 2024-11-11 DIAGNOSIS — Z881 Allergy status to other antibiotic agents status: Secondary | ICD-10-CM | POA: Diagnosis not present

## 2024-11-11 DIAGNOSIS — J188 Other pneumonia, unspecified organism: Principal | ICD-10-CM | POA: Insufficient documentation

## 2024-11-11 DIAGNOSIS — C799 Secondary malignant neoplasm of unspecified site: Secondary | ICD-10-CM | POA: Diagnosis not present

## 2024-11-11 DIAGNOSIS — J810 Acute pulmonary edema: Secondary | ICD-10-CM | POA: Diagnosis not present

## 2024-11-11 DIAGNOSIS — Z7951 Long term (current) use of inhaled steroids: Secondary | ICD-10-CM | POA: Diagnosis not present

## 2024-11-11 DIAGNOSIS — G9341 Metabolic encephalopathy: Secondary | ICD-10-CM | POA: Diagnosis present

## 2024-11-11 DIAGNOSIS — F1021 Alcohol dependence, in remission: Secondary | ICD-10-CM | POA: Diagnosis present

## 2024-11-11 DIAGNOSIS — C792 Secondary malignant neoplasm of skin: Secondary | ICD-10-CM | POA: Diagnosis not present

## 2024-11-11 DIAGNOSIS — Z9011 Acquired absence of right breast and nipple: Secondary | ICD-10-CM | POA: Diagnosis not present

## 2024-11-11 DIAGNOSIS — G479 Sleep disorder, unspecified: Secondary | ICD-10-CM | POA: Diagnosis not present

## 2024-11-11 DIAGNOSIS — C50911 Malignant neoplasm of unspecified site of right female breast: Secondary | ICD-10-CM | POA: Diagnosis not present

## 2024-11-11 DIAGNOSIS — A419 Sepsis, unspecified organism: Secondary | ICD-10-CM | POA: Diagnosis present

## 2024-11-11 DIAGNOSIS — I501 Left ventricular failure: Secondary | ICD-10-CM | POA: Diagnosis present

## 2024-11-11 DIAGNOSIS — J439 Emphysema, unspecified: Secondary | ICD-10-CM | POA: Diagnosis present

## 2024-11-11 DIAGNOSIS — Z515 Encounter for palliative care: Secondary | ICD-10-CM | POA: Diagnosis not present

## 2024-11-11 DIAGNOSIS — C7800 Secondary malignant neoplasm of unspecified lung: Secondary | ICD-10-CM | POA: Diagnosis present

## 2024-11-11 DIAGNOSIS — J984 Other disorders of lung: Secondary | ICD-10-CM

## 2024-11-11 DIAGNOSIS — Z7189 Other specified counseling: Secondary | ICD-10-CM | POA: Diagnosis not present

## 2024-11-11 DIAGNOSIS — E782 Mixed hyperlipidemia: Secondary | ICD-10-CM | POA: Diagnosis present

## 2024-11-11 DIAGNOSIS — J449 Chronic obstructive pulmonary disease, unspecified: Secondary | ICD-10-CM | POA: Diagnosis not present

## 2024-11-11 DIAGNOSIS — T380X5A Adverse effect of glucocorticoids and synthetic analogues, initial encounter: Secondary | ICD-10-CM | POA: Diagnosis present

## 2024-11-11 DIAGNOSIS — I110 Hypertensive heart disease with heart failure: Secondary | ICD-10-CM | POA: Diagnosis present

## 2024-11-11 DIAGNOSIS — M549 Dorsalgia, unspecified: Secondary | ICD-10-CM | POA: Diagnosis not present

## 2024-11-11 DIAGNOSIS — J440 Chronic obstructive pulmonary disease with acute lower respiratory infection: Secondary | ICD-10-CM | POA: Diagnosis present

## 2024-11-11 DIAGNOSIS — J811 Chronic pulmonary edema: Secondary | ICD-10-CM | POA: Diagnosis not present

## 2024-11-11 DIAGNOSIS — C7981 Secondary malignant neoplasm of breast: Secondary | ICD-10-CM | POA: Diagnosis not present

## 2024-11-11 DIAGNOSIS — D72829 Elevated white blood cell count, unspecified: Secondary | ICD-10-CM | POA: Diagnosis not present

## 2024-11-11 DIAGNOSIS — Z8249 Family history of ischemic heart disease and other diseases of the circulatory system: Secondary | ICD-10-CM | POA: Diagnosis not present

## 2024-11-11 DIAGNOSIS — J4520 Mild intermittent asthma, uncomplicated: Secondary | ICD-10-CM | POA: Diagnosis not present

## 2024-11-11 DIAGNOSIS — D638 Anemia in other chronic diseases classified elsewhere: Secondary | ICD-10-CM | POA: Diagnosis present

## 2024-11-11 DIAGNOSIS — F419 Anxiety disorder, unspecified: Secondary | ICD-10-CM | POA: Diagnosis not present

## 2024-11-11 DIAGNOSIS — F32A Depression, unspecified: Secondary | ICD-10-CM | POA: Diagnosis present

## 2024-11-11 DIAGNOSIS — G9340 Encephalopathy, unspecified: Secondary | ICD-10-CM | POA: Diagnosis not present

## 2024-11-11 DIAGNOSIS — Z7982 Long term (current) use of aspirin: Secondary | ICD-10-CM | POA: Diagnosis not present

## 2024-11-11 DIAGNOSIS — J180 Bronchopneumonia, unspecified organism: Secondary | ICD-10-CM | POA: Diagnosis present

## 2024-11-11 DIAGNOSIS — Z1152 Encounter for screening for COVID-19: Secondary | ICD-10-CM | POA: Diagnosis not present

## 2024-11-11 MED ORDER — LACTATED RINGERS IV SOLN: INTRAVENOUS | Status: AC

## 2024-11-11 MED ORDER — AMITRIPTYLINE HCL 25 MG PO TABS
100.0000 mg | ORAL_TABLET | Freq: Every day | ORAL | Status: DC
  Administered 2024-11-11 – 2024-11-21 (×10): 100 mg via ORAL
  Filled 2024-11-11 (×12): qty 4

## 2024-11-11 MED ORDER — LORAZEPAM 1 MG PO TABS
1.0000 mg | ORAL_TABLET | Freq: Three times a day (TID) | ORAL | Status: DC | PRN
  Administered 2024-11-11: 1 mg via ORAL
  Filled 2024-11-11: qty 1

## 2024-11-11 MED ORDER — ASPIRIN 81 MG PO TBEC
81.0000 mg | DELAYED_RELEASE_TABLET | Freq: Every day | ORAL | Status: DC
  Administered 2024-11-11 – 2024-12-01 (×17): 81 mg via ORAL
  Filled 2024-11-11 (×17): qty 1

## 2024-11-11 MED ORDER — ONDANSETRON 4 MG PO TBDP
ORAL_TABLET | ORAL | Status: AC
  Filled 2024-11-11: qty 1

## 2024-11-11 MED ORDER — ALBUTEROL SULFATE (2.5 MG/3ML) 0.083% IN NEBU
INHALATION_SOLUTION | RESPIRATORY_TRACT | Status: AC
  Administered 2024-11-11: 2.5 mg via RESPIRATORY_TRACT
  Filled 2024-11-11: qty 3

## 2024-11-11 MED ORDER — BRIMONIDINE TARTRATE 0.2 % OP SOLN
1.0000 [drp] | Freq: Every day | OPHTHALMIC | Status: DC
  Administered 2024-11-12 – 2024-11-29 (×17): 1 [drp] via OPHTHALMIC
  Filled 2024-11-11: qty 5

## 2024-11-11 MED ORDER — SODIUM CHLORIDE 0.9 % IV SOLN
2.0000 g | Freq: Two times a day (BID) | INTRAVENOUS | Status: DC
  Administered 2024-11-11: 2 g via INTRAVENOUS
  Filled 2024-11-11: qty 12.5

## 2024-11-11 MED ORDER — VANCOMYCIN HCL IN DEXTROSE 1-5 GM/200ML-% IV SOLN: 1000.0000 mg | INTRAVENOUS | Status: DC

## 2024-11-11 MED ORDER — ACETAMINOPHEN 500 MG PO TABS
1000.0000 mg | ORAL_TABLET | Freq: Four times a day (QID) | ORAL | Status: DC | PRN
  Administered 2024-11-11: 1000 mg via ORAL
  Filled 2024-11-11 (×2): qty 2

## 2024-11-11 MED ORDER — ACETAMINOPHEN 325 MG PO TABS
650.0000 mg | ORAL_TABLET | Freq: Four times a day (QID) | ORAL | Status: DC | PRN
  Administered 2024-11-11 – 2024-11-13 (×5): 650 mg via ORAL
  Filled 2024-11-11 (×6): qty 2

## 2024-11-11 MED ORDER — LORAZEPAM 1 MG PO TABS
1.0000 mg | ORAL_TABLET | Freq: Every day | ORAL | Status: DC
  Administered 2024-11-11 – 2024-11-13 (×3): 1 mg via ORAL
  Filled 2024-11-11 (×3): qty 1

## 2024-11-11 MED ORDER — IPRATROPIUM-ALBUTEROL 0.5-2.5 (3) MG/3ML IN SOLN
RESPIRATORY_TRACT | Status: AC
  Administered 2024-11-11: 3 mL via RESPIRATORY_TRACT
  Filled 2024-11-11: qty 3

## 2024-11-11 MED ORDER — ENOXAPARIN SODIUM 40 MG/0.4ML IJ SOSY
40.0000 mg | PREFILLED_SYRINGE | INTRAMUSCULAR | Status: DC
  Administered 2024-11-11 – 2024-11-30 (×20): 40 mg via SUBCUTANEOUS
  Filled 2024-11-11 (×20): qty 0.4

## 2024-11-11 MED ORDER — ACETAMINOPHEN 160 MG/5ML PO SOLN
1000.0000 mg | Freq: Four times a day (QID) | ORAL | Status: DC | PRN
  Administered 2024-11-11: 1000 mg via ORAL
  Filled 2024-11-11: qty 40.6

## 2024-11-11 MED ORDER — IPRATROPIUM-ALBUTEROL 0.5-2.5 (3) MG/3ML IN SOLN
3.0000 mL | Freq: Three times a day (TID) | RESPIRATORY_TRACT | Status: DC
  Administered 2024-11-11 – 2024-11-13 (×7): 3 mL via RESPIRATORY_TRACT
  Filled 2024-11-11 (×7): qty 3

## 2024-11-11 MED ORDER — ALBUTEROL SULFATE (2.5 MG/3ML) 0.083% IN NEBU: 2.5000 mg | INHALATION_SOLUTION | Freq: Once | RESPIRATORY_TRACT | Status: AC

## 2024-11-11 MED ORDER — SODIUM CHLORIDE 0.9 % IV SOLN
2.0000 g | INTRAVENOUS | Status: DC
  Administered 2024-11-11 – 2024-11-14 (×4): 2 g via INTRAVENOUS
  Filled 2024-11-11 (×4): qty 20

## 2024-11-11 MED ORDER — IPRATROPIUM-ALBUTEROL 0.5-2.5 (3) MG/3ML IN SOLN: 3.0000 mL | Freq: Once | RESPIRATORY_TRACT | Status: AC

## 2024-11-11 MED ORDER — FLUOXETINE HCL 20 MG PO CAPS
60.0000 mg | ORAL_CAPSULE | Freq: Every day | ORAL | Status: DC
  Administered 2024-11-11 – 2024-11-13 (×3): 60 mg via ORAL
  Administered 2024-11-16: 20 mg via ORAL
  Administered 2024-11-17 – 2024-12-01 (×13): 60 mg via ORAL
  Filled 2024-11-11 (×17): qty 3

## 2024-11-11 MED ORDER — ACETAMINOPHEN 500 MG PO TABS: 500.0000 mg | ORAL_TABLET | Freq: Four times a day (QID) | ORAL | Status: DC | PRN

## 2024-11-11 MED ORDER — SODIUM CHLORIDE 0.9 % IV SOLN
500.0000 mg | INTRAVENOUS | Status: AC
  Administered 2024-11-11 – 2024-11-15 (×5): 500 mg via INTRAVENOUS
  Filled 2024-11-11 (×5): qty 5

## 2024-11-11 MED ORDER — ACETAMINOPHEN 650 MG RE SUPP: 650.0000 mg | Freq: Four times a day (QID) | RECTAL | Status: DC | PRN

## 2024-11-11 NOTE — Progress Notes (Signed)
 Pharmacy Antibiotic Note  Anne [name] is a 83 y.o. female admitted on 11/10/2024 with pneumonia.  Pharmacy has been consulted for vancomycin  dosing. Pt is febrile and WBC is elevated. SCr and lactic acid are WNL. First doses of antibiotics received last night.   Plan: Vancomycin  1g IV Q24H (AUC 515, SCr 0.8) F/u renal fxn, C&S, clinical status and peak/trough at SS  Weight: 59 kg (130 lb)  Temp (24hrs), Avg:101 F (38.3 C), Min:99.3 F (37.4 C), Max:102.6 F (39.2 C)  Recent Labs  Lab 11/10/24 1707 11/10/24 1830  WBC 11.7*  --   CREATININE 0.62  --   LATICACIDVEN  --  1.4    Estimated Creatinine Clearance: 49.8 mL/min (by C-G formula based on SCr of 0.62 mg/dL).    Allergies[1]  Antimicrobials this admission: Vanc 12/30>> Cefepime  12/30>>   Dose adjustments this admission: N/A  Microbiology results: Pending  Thank you for allowing pharmacy to be a part of this patients care.  Madine Sarr, Vernell Helling 11/11/2024 7:57 AM     [1]  Allergies Allergen Reactions   Sorbitol Other (See Comments)    GI Issues   Tomato Diarrhea   Zofran  Other (See Comments)    headache   Advil  [Ibuprofen ] Other (See Comments)    Irritates throat.   Cephalexin  Other (See Comments)    dizziness   Diphenhydramine  Hcl Other (See Comments)    nervous and upset does okay w/ cream   Morphine  And Codeine Other (See Comments)    Causes depression   Tramadol  Other (See Comments)    Dizziness   Antihistamines, Chlorpheniramine-Type Anxiety   Oxycodone  Anxiety    Confusion with inability to think clearly

## 2024-11-11 NOTE — ED Notes (Signed)
 Pt expressing concerns about swallowing big pills. Was able to take tylenol  pills with applesauce this morning but asked for liquid tylenol  for current administration.

## 2024-11-11 NOTE — Telephone Encounter (Signed)
 Patient's husband (Sam) returned call and wanted Dr. Domenica to know that pt is admitted for pneumonia at [facility] but can not go to [facility] until a bed comes available. He said she is receiving proper/ correct care at [facility]. Just an FYI

## 2024-11-11 NOTE — H&P (Addendum)
 " History and Physical    Patient: Anne [name] FMW:[id number] DOB: 1941-08-14 DOA: 11/10/2024 DOS: the patient was seen and examined on 11/11/2024 PCP: Domenica Harlene LABOR, MD  Patient coming from: home  Chief Complaint:  Chief Complaint  Patient presents with   Chest Pain   HPI:  83 year old woman PMH metastatic breast cancer followed by [facility], with cough x 10 days, seen by urgent care 12/29 and prescribed doxycycline  and prednisone .  Presented to ED 12/31.  CT chest showed multifocal pneumonia, no PE.  Admitted for pneumonia, acute hypoxic respiratory failure.  History obtained from patient and husband at bedside.  Traveled to [place]  last week, did well until she started develop symptoms 12/26 with cough and shortness of breath.  Symptoms worsened over the weekend, complicated by diarrhea from chemotherapy.  Was seen at urgent care 12/29 as above but failed to improve.  Symptoms include fever.  Review of Systems: As above Past Medical History:  Diagnosis Date   ALKALINE PHOSPHATASE, ELEVATED 03/15/2009   Allergic state 06/10/2012   Allergy     Anemia    Anxiety and depression 04/28/2011   Arthritis    Asthma    Atypical chest pain 11/30/2011   AVM (arteriovenous malformation) of colon 2011   cecum   Baker's cyst of knee 05/22/2011   Cancer (HCC) 11/2006   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 Carotid duplex 03/2014: stable 1-39% BICA, f/u due 03/2016.   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     Clotting disorder    COPD (chronic obstructive pulmonary disease) (HCC)    Dementia (HCC)    Dermatitis 11/23/2012   Dyspnea    EE (eosinophilic esophagitis)    Emphysema of lung (HCC)    Esophageal ring    ESOPHAGEAL STRICTURE 03/29/2009   Fall 11/23/2012   Family history of breast cancer    Family history of colon cancer    Family history of ovarian cancer    Family  history of pancreatic cancer    Folliculitis of nose 12/31/2011   GERD (gastroesophageal reflux disease) 09/29/2009   improved s/p cholecystectomy and esophagus dilatation   History of chicken pox    History of measles    History of shingles    2 episodes   Hx of echocardiogram    a. Echo 01/2013: mild LVH, EF 55-60%, normal wall motion, Gr 1 diast dysfn   Hyperlipidemia    Hypertension    Knee pain, bilateral 07/23/2011   Medicare annual wellness visit, subsequent 06/19/2015   Mixed hyperlipidemia 10/17/2010   Qualifier: Diagnosis of  By: Edsel Mems     Orthostasis    Osteopenia 03/14/2011   Osteoporosis    Personal history of chemotherapy 2001   Personal history of radiation therapy 2001   rt breast   PERSONAL HX BREAST CANCER 09/29/2009   Pneumonia    PVC's (premature ventricular contractions)    a. Event monitor 01/2013: NSR, extensive PVCs.   Radial neck fracture 10/2011   minimally displaced   Substance abuse (HCC)    In remission 3 years.    Urinary incontinence 03/19/2012   Vaginitis 05/22/2011   Past Surgical History:  Procedure Laterality Date   APPENDECTOMY     AUGMENTATION MAMMAPLASTY Right 05/27/2007   BREAST BIOPSY Right 01/02/2007   wire loc   BREAST BIOPSY  12/26/2006   BREAST LUMPECTOMY Right 2001  BREAST RECONSTRUCTION  2008, 2009, 2010   BREAST REDUCTION WITH MASTOPEXY Left 05/30/2017   Procedure: LEFT BREAST REDUCTION FOR SYMTERY WITH MASTOPEXY;  Surgeon: Lowery Estefana RAMAN, DO;  Location: [facility];  Service: Plastics;  Laterality: Left;   CHOLECYSTECTOMY  2010   COLONOSCOPY  09/05/10   cecal avm's   DENTAL SURGERY  05/2016   4 dental implants by Dr. Dorthula.   ERCP  2010    CBD stone extraction    ESOPHAGOGASTRODUODENOSCOPY  01/08/2012   Procedure: ESOPHAGOGASTRODUODENOSCOPY (EGD);  Surgeon: Lupita FORBES Commander, MD;  Location: [facility];  Service: Endoscopy;  Laterality: N/A;   ESOPHAGOGASTRODUODENOSCOPY (EGD) WITH  ESOPHAGEAL DILATION  2010, 2012   LAPAROSCOPIC APPENDECTOMY N/A 08/04/2016   Procedure: APPENDECTOMY LAPAROSCOPIC;  Surgeon: Elspeth Schultze, MD;  Location: [facility];  Service: General;  Laterality: N/A;   LIPOSUCTION WITH LIPOFILLING Left 11/21/2017   Procedure: LIPOSUCTION FROM ABDOMEN WITH LIPOFILLING TO LEFT BREAST;  Surgeon: Lowery Estefana RAMAN, DO;  Location: [facility];  Service: Plastics;  Laterality: Left;   MASTECTOMY MODIFIED RADICAL Right 05/27/2007   , Mastectomy modified radical (08), breast reconstruction, CA lesions excised lateral abd wall 2010   MASTOPEXY Left 11/21/2017   Procedure: LEFT BREAST REVISION MASTOPEXY FOR SYMMETRY;  Surgeon: Lowery Estefana RAMAN, DO;  Location: [facility];  Service: Plastics;  Laterality: Left;   REDUCTION MAMMAPLASTY Left    SAVORY DILATION  01/08/2012   Procedure: SAVORY DILATION;  Surgeon: Lupita FORBES Commander, MD;  Location: [facility];  Service: Endoscopy;  Laterality: N/A;  need xray   Social History:  reports that she quit smoking about 17 years ago. Her smoking use included cigarettes. She started smoking about 67 years ago. She has a 50 pack-year smoking history. She has never used smokeless tobacco. She reports that she does not currently use alcohol. She reports that she does not use drugs.  Allergies[1]  Family History  Problem Relation Age of Onset   Other Mother        tic [place]   Heart disease Father    Lung cancer Father        smoker   Cirrhosis Sister        Primary Biliary   Anxiety disorder Sister    Osteoporosis Sister    Arthritis Sister        Rheumatoid   Osteoporosis Sister    Skin cancer Sister        multiple skin cancers, over 90 excisions.   Stroke Maternal Grandmother    Alcohol abuse Maternal Grandfather    Heart disease Paternal Grandfather    Esophageal cancer Paternal Grandfather    Rectal cancer Paternal Grandfather    Breast cancer Maternal Aunt        dx in her 10s    Breast cancer Maternal Aunt        dx in her 6s   Breast cancer Maternal Aunt        possible breast cancer dx and died in her 18s   Ovarian cancer Maternal Aunt 29   Pancreatic cancer Maternal Aunt        dx in her 81s   Pancreatic cancer Maternal Uncle        dx in his 37s; smoker   Stomach cancer Maternal Uncle    Cancer Maternal Uncle    Breast cancer Paternal Aunt        dx in her 51s   Lung cancer Paternal Aunt  Breast cancer Cousin        paternal first cousin   Breast cancer Cousin        maternal first cousin   Anesthesia problems Neg Hx    Hypotension Neg Hx    Malignant hyperthermia Neg Hx    Pseudochol deficiency Neg Hx     Prior to Admission medications  Medication Sig Start Date End Date Taking? Authorizing Provider  albuterol  (VENTOLIN  HFA) 108 (90 Base) MCG/ACT inhaler Inhale 2 puffs into the lungs every 6 (six) hours as needed for wheezing. 11/10/24 12/10/24 Yes [provider]  benzonatate  (TESSALON ) 200 MG capsule Take by mouth. 10/28/24  Yes [provider]  capivasertib (TRUQAP) 200 MG tablet Take 200 mg (1 tablet) twice daily with food, for 4 days followed by 3 days OFF of a 28 day cycle 10/27/24  Yes [provider]  FLUoxetine  (PROZAC ) 40 MG capsule Take 40 mg by mouth daily. 11/02/24  Yes [provider]  predniSONE  (DELTASONE ) 10 MG tablet SMARTSIG:- Tablet(s) By Mouth - 11/09/24  Yes [provider]  acetaminophen  (TYLENOL ) 500 MG tablet Take 500-1,000 mg by mouth every 6 (six) hours as needed for moderate pain.    [provider]  amitriptyline  (ELAVIL ) 25 MG tablet Take 4 tablets (100 mg total) by mouth at bedtime. 09/07/24   Domenica Harlene LABOR, MD  aspirin  81 MG EC tablet Take 81 mg by mouth daily.    [provider]  Biotin 5 MG CAPS Take 5 mg by mouth daily.    [provider]  Brimonidine Tartrate (LUMIFY) 0.025 % SOLN Place 1 drop into both eyes daily.    [provider]  Cholecalciferol (VITAMIN D3) 1000 UNITS CAPS Take 2,000 Units by mouth daily.    [provider]  Cyanocobalamin  (VITAMIN B 12 PO) Take 2 tablets by mouth daily.    [provider]  diltiazem  (CARDIZEM ) 30 MG tablet As needed 08/27/23   Johnson, Kathleen R, PA-C  doxycycline  (VIBRAMYCIN ) 100 MG capsule Take 1 capsule (100 mg total) by mouth 2 (two) times daily for 10 days. 11/09/24 11/19/24  Teddy Sharper, FNP  FLUoxetine  (PROZAC ) 20 MG capsule Take 3 capsules (60 mg total) by mouth daily. 03/18/24   Domenica Harlene LABOR, MD  Folic Acid  (FOLATE PO) Take 1 tablet by mouth daily.    [provider]  LORazepam  (ATIVAN ) 1 MG tablet TAKE 1 TABLET(1 MG) BY MOUTH EVERY 8 HOURS AS NEEDED FOR ANXIETY 10/19/24   Webb, Padonda B, FNP  midodrine  (PROAMATINE ) 2.5 MG tablet Take 1 tablet (2.5 mg total) by mouth 3 (three) times daily with meals. (1st dose upon waking around 7:00 or 8:00 AM, 2nd dose at 11:00 AM or 12:00 PM, 3rd dose at 2:00 or 3:00 PM) Patient not taking: Reported on 10/05/2024 09/04/24   Okey Vina GAILS, MD  pantoprazole  (PROTONIX ) 40 MG tablet TAKE 1 TABLET(40 MG) BY MOUTH TWICE DAILY BEFORE A MEAL 11/02/24   Avram Lupita BRAVO, MD  potassium chloride  (KLOR-CON ) 20 MEQ packet Take 40 mEq by mouth 2 (two) times daily for 7 days. Patient not taking: Reported on 10/05/2024 09/23/24 09/30/24  Mesner, Selinda, MD  predniSONE  (STERAPRED UNI-PAK 21 TAB) 10 MG (21) TBPK tablet Take by mouth daily. Take 6 tabs by mouth daily  for 2 days, then 5 tabs for 2 days, then 4 tabs for 2 days, then 3 tabs for 2 days, 2 tabs for 2 days, then 1 tab by mouth daily  for 2 days 11/09/24   Teddy Sharper, FNP  Pyridoxine HCl (B-6 PO) Take 1 tablet by mouth daily.    [provider]  Thiamine  HCl (B-1 PO) Take 1 tablet by mouth daily.    [provider]    Physical Exam: Vitals:   11/11/24 0959 11/11/24 1100 11/11/24 1247 11/11/24 1442  BP:  109/62 93/65   Pulse:  88 84   Resp:  19  18   Temp:   99.2 F (37.3 C)   TempSrc:   Oral   SpO2: 93% 90% 94%   Weight:      Height:    5' 5 (1.651 m)   Physical Exam Vitals reviewed.  Constitutional:      General: She is not in acute distress.    Appearance: She is not ill-appearing or toxic-appearing.  Cardiovascular:     Rate and Rhythm: Normal rate and regular rhythm.     Heart sounds: No murmur heard. Pulmonary:     Effort: Pulmonary effort is normal. No respiratory distress.     Breath sounds: No wheezing, rhonchi or rales.  Neurological:     Mental Status: She is alert.  Psychiatric:        Mood and Affect: Mood normal.        Behavior: Behavior normal.     Data Reviewed: BMP unremarkable BNP 520, troponin negative Lactic acid within normal limits WBC 11.7 Influenza, RSV, COVID-negative Chest x-ray noted CT chest noted EKG sinus rhythm, right bundle branch block, no acute changes  Assessment and Plan: Acute hypoxic respiratory failure with SpO2 in the 80s on room air Sepsis  Multifocal pneumonia COPD/emphysema COVID, flu, RSV panel negative.  CT chest showed multifocal bronchopneumonia.  No PE. Follow-up CT in 3 months recommended to document resolution and exclude underlying mass left lower lobe, as well as follow-up pathologic right hilar adenopathy Hypoxic, continue oxygen.  Antibiotics.  Bronchodilators as needed.  Metastatic breast cancer Continue follow-up with [facility]  Dementia Appears stable.   Advance Care Planning: Full code, confirmed by husband at bedside  Consults: none   Family Communication: husband at bedside  Severity of Illness: The appropriate patient status for this patient is INPATIENT. Inpatient status is judged to be reasonable and necessary in order to provide the required intensity of service to ensure the patient's safety. The patient's presenting symptoms, physical exam findings, and initial radiographic and laboratory data in the context of their chronic  comorbidities is felt to place them at high risk for further clinical deterioration. Furthermore, it is not anticipated that the patient will be medically stable for discharge from the hospital within 2 midnights of admission.   * I certify that at the point of admission it is my clinical judgment that the patient will require inpatient hospital care spanning beyond 2 midnights from the point of admission due to high intensity of service, high risk for further deterioration and high frequency of surveillance required.*  Author: Toribio Door, MD 11/11/2024 4:08 PM  For on call review [email or web address].      [1]  Allergies Allergen Reactions   Sorbitol Other (See Comments)    GI Issues   Tomato Diarrhea   Zofran  Other (See Comments)    headache   Advil  [Ibuprofen ] Other (See Comments)    Irritates throat.   Cephalexin  Other (See Comments)    dizziness   Diphenhydramine  Hcl Other (See Comments)    nervous and upset does okay w/ cream   Morphine  And Codeine  Other (See Comments)    Causes depression   Tramadol  Other (See Comments)    Dizziness   Antihistamines, Chlorpheniramine-Type Anxiety   Oxycodone  Anxiety    Confusion with inability to think clearly   "

## 2024-11-11 NOTE — Telephone Encounter (Signed)
 Called pt and no answer left message to return call.

## 2024-11-11 NOTE — ED Notes (Signed)
[facility] here to transport pt 

## 2024-11-11 NOTE — Telephone Encounter (Signed)
 Copied from CRM [id number]. Topic: General - Call Back - No Documentation >> Nov 11, 2024 11:45 AM Maisie C wrote: Reason for CRM: pt's spouse called back to speak to [facility] about ED visit from yesterday. Please call and advise

## 2024-11-11 NOTE — Hospital Course (Addendum)
 83 year old woman PMH metastatic breast cancer followed by [facility], with cough x 10 days, seen by urgent care 12/29 and prescribed doxycycline  and prednisone .  CT chest showed multifocal pneumonia, no PE.  Admitted for pneumonia, acute hypoxic respiratory failure.  Treated with empiric antibiotics with initial clinical improvement, subsequently decompensated with higher oxygen requirement, chest x-ray showing pulmonary edema, transferred to SDU, started on BiPAP, Precedex  for sedation, PCCM consultation.  Consultants PCCM 1/3   Procedures/Events 12/31 admit for pneumonia, treated w/ abx 1/1 clinically improving during day; overnight higher oxygen requirement 1/2 no distress, but more hypoxia, difficulty recovering, desatting w/ movement; CXR pulm edema, treated w/ Lasix , good UOP but no improvement in clinical status, transferred to SDU for HHFNC,  1/3 early am, BiPAP, needed Precedex  to tolerate, PCCM consulted.  Better day yesterday, weaned from BiPAP to high flow nasal cannula. 1/4 requiring high flow nasal cannula, nonrebreather with sats in the high 80s, restart BiPAP 1/5 still with substantial oxygen requirement, tenuous respiratory status.  Discussed with Dr. Jude, critical care will take over.

## 2024-11-11 NOTE — ED Notes (Signed)
 Pt can ambulate to the bathroom independently.

## 2024-11-12 DIAGNOSIS — J188 Other pneumonia, unspecified organism: Secondary | ICD-10-CM | POA: Diagnosis not present

## 2024-11-12 DIAGNOSIS — A419 Sepsis, unspecified organism: Secondary | ICD-10-CM | POA: Diagnosis not present

## 2024-11-12 DIAGNOSIS — J9601 Acute respiratory failure with hypoxia: Secondary | ICD-10-CM | POA: Diagnosis not present

## 2024-11-12 DIAGNOSIS — J439 Emphysema, unspecified: Secondary | ICD-10-CM | POA: Diagnosis not present

## 2024-11-12 DIAGNOSIS — J810 Acute pulmonary edema: Secondary | ICD-10-CM | POA: Diagnosis not present

## 2024-11-12 LAB — BASIC METABOLIC PANEL WITH GFR
Anion gap: 11 (ref 5–15)
BUN: 10 mg/dL (ref 8–23)
CO2: 24 mmol/L (ref 22–32)
Calcium: 8.7 mg/dL — ABNORMAL LOW (ref 8.9–10.3)
Chloride: 101 mmol/L (ref 98–111)
Creatinine, Ser: 0.52 mg/dL (ref 0.44–1.00)
GFR, Estimated: >60 mL/min
Glucose, Bld: 105 mg/dL — ABNORMAL HIGH (ref 70–99)
Potassium: 3.9 mmol/L (ref 3.5–5.1)
Sodium: 136 mmol/L (ref 135–145)

## 2024-11-12 LAB — HEMOGLOBIN AND HEMATOCRIT, BLOOD
HCT: 32.2 % — ABNORMAL LOW (ref 36.0–46.0)
Hemoglobin: 10.6 g/dL — ABNORMAL LOW (ref 12.0–15.0)

## 2024-11-12 LAB — CBC
HCT: 31.1 % — ABNORMAL LOW (ref 36.0–46.0)
Hemoglobin: 10.2 g/dL — ABNORMAL LOW (ref 12.0–15.0)
MCH: 30.6 pg (ref 26.0–34.0)
MCHC: 32.8 g/dL (ref 30.0–36.0)
MCV: 93.4 fL (ref 80.0–100.0)
Platelets: 241 K/uL (ref 150–400)
RBC: 3.33 MIL/uL — ABNORMAL LOW (ref 3.87–5.11)
RDW: 13.7 % (ref 11.5–15.5)
WBC: 13.4 K/uL — ABNORMAL HIGH (ref 4.0–10.5)
nRBC: 0 % (ref 0.0–0.2)

## 2024-11-12 MED ORDER — BENZONATATE 100 MG PO CAPS
100.0000 mg | ORAL_CAPSULE | Freq: Two times a day (BID) | ORAL | Status: DC | PRN
  Administered 2024-11-12: 100 mg via ORAL
  Filled 2024-11-12: qty 1

## 2024-11-12 MED ORDER — GUAIFENESIN-DM 100-10 MG/5ML PO SYRP
5.0000 mL | ORAL_SOLUTION | ORAL | Status: DC | PRN
  Administered 2024-11-12 – 2024-11-13 (×3): 5 mL via ORAL
  Filled 2024-11-12 (×4): qty 5

## 2024-11-12 MED ORDER — PREDNISONE 20 MG PO TABS
40.0000 mg | ORAL_TABLET | Freq: Every day | ORAL | Status: DC
  Administered 2024-11-12 – 2024-11-14 (×3): 40 mg via ORAL
  Filled 2024-11-12 (×3): qty 2

## 2024-11-12 MED ORDER — TRAMADOL HCL 50 MG PO TABS
50.0000 mg | ORAL_TABLET | Freq: Four times a day (QID) | ORAL | Status: DC | PRN
  Administered 2024-11-12: 50 mg via ORAL
  Filled 2024-11-12 (×4): qty 1

## 2024-11-12 MED ORDER — PANTOPRAZOLE SODIUM 40 MG IV SOLR
40.0000 mg | INTRAVENOUS | Status: DC
  Administered 2024-11-12 – 2024-11-26 (×15): 40 mg via INTRAVENOUS
  Filled 2024-11-12 (×15): qty 10

## 2024-11-12 NOTE — Plan of Care (Signed)
   Problem: Education: Goal: Knowledge of General Education information will improve Description Including pain rating scale, medication(s)/side effects and non-pharmacologic comfort measures Outcome: Progressing

## 2024-11-12 NOTE — Progress Notes (Signed)
" °  Progress Note   Patient: Anne [name] FMW:[id number] DOB: [birth year] DOA: 11/10/2024     1 DOS: the patient was seen and examined on 11/12/2024   Brief hospital course: 84 year old woman PMH metastatic breast cancer followed by [facility], with cough x 10 days, seen by urgent care 12/29 and prescribed doxycycline  and prednisone .  CT chest showed multifocal pneumonia, no PE.  Admitted for pneumonia, acute hypoxic respiratory failure.  Consultants None   Procedures/Events 12/31 admit for pneumonia  Assessment and Plan: Acute hypoxic respiratory failure with SpO2 in the 80s on room air Sepsis  Multifocal pneumonia COPD/emphysema COVID, flu, RSV panel negative.  CT chest showed multifocal bronchopneumonia.  No PE. Follow-up CT in 3 months recommended to document resolution and exclude underlying mass left lower lobe, as well as follow-up pathologic right hilar adenopathy Remains afebrile, still requiring oxygen, continue empiric antibiotics.  Given underlying emphysema will add prednisone . I encouraged oral intake, out of bed to chair for meals, ambulation is much as possible   Metastatic breast cancer Continue follow-up with [facility]   Dementia Stable      Subjective:  Feels the same, no different.  Still feels poorly.  Has been up to the bathroom.  Not eating much.  Physical Exam: Vitals:   11/11/24 2124 11/12/24 0034 11/12/24 0354 11/12/24 0936  BP:  111/62 105/84   Pulse:  87 98   Resp:  18 18   Temp:  98.3 F (36.8 C) 98.4 F (36.9 C)   TempSrc:  Oral Oral   SpO2: 97% 90% (!) 85% 92%  Weight:      Height:       Physical Exam Vitals reviewed.  Constitutional:      General: She is not in acute distress.    Appearance: She is not ill-appearing or toxic-appearing.  Cardiovascular:     Rate and Rhythm: Normal rate and regular rhythm.     Heart sounds: No murmur heard. Pulmonary:     Effort: Pulmonary effort is normal. No respiratory distress.     Breath sounds: No  wheezing, rhonchi or rales.  Neurological:     Mental Status: She is alert.  Psychiatric:        Mood and Affect: Mood normal.        Behavior: Behavior normal.     Data Reviewed: BMP unremarkable WBC slightly elevated 13.4 Hemoglobin 10.2, probably down from dilution  Family Communication: Husband and son at bedside, discussed current issues and prognosis  Disposition: Status is: Inpatient Remains inpatient appropriate because: Pneumonia, hypoxia     Time spent: 35 minutes  Author: Toribio Door, MD 11/12/2024 11:27 AM  For on call review [email or web address].    "

## 2024-11-12 NOTE — Progress Notes (Signed)
" °   11/12/24 1545  Spiritual Encounters  Type of Visit Initial  Care provided to: Patient  Referral source Nurse (RN/NT/LPN)  Reason for visit Advance directives  OnCall Visit No   I provided spiritual care support per spiritual consult request by [place], RN on patient's behalf.  Mrs Swallows welcomed my visit and affirmed desire to have assistance with Advanced Directives. I reviewed the form with her and also reviewed some suggestions if completing after discharge (which was her stated intention). I let her know how to request a chaplain through her care team if she had further needs or questions.  Catrell Morrone L. Delores HERO.Div "

## 2024-11-12 NOTE — Plan of Care (Signed)

## 2024-11-13 ENCOUNTER — Inpatient Hospital Stay (HOSPITAL_COMMUNITY)

## 2024-11-13 DIAGNOSIS — J188 Other pneumonia, unspecified organism: Secondary | ICD-10-CM | POA: Diagnosis not present

## 2024-11-13 DIAGNOSIS — A419 Sepsis, unspecified organism: Secondary | ICD-10-CM | POA: Diagnosis not present

## 2024-11-13 DIAGNOSIS — J439 Emphysema, unspecified: Secondary | ICD-10-CM | POA: Diagnosis not present

## 2024-11-13 DIAGNOSIS — J9601 Acute respiratory failure with hypoxia: Secondary | ICD-10-CM | POA: Diagnosis not present

## 2024-11-13 LAB — CBC
HCT: 30.8 % — ABNORMAL LOW (ref 36.0–46.0)
Hemoglobin: 10 g/dL — ABNORMAL LOW (ref 12.0–15.0)
MCH: 29.9 pg (ref 26.0–34.0)
MCHC: 32.5 g/dL (ref 30.0–36.0)
MCV: 91.9 fL (ref 80.0–100.0)
Platelets: 250 K/uL (ref 150–400)
RBC: 3.35 MIL/uL — ABNORMAL LOW (ref 3.87–5.11)
RDW: 13.8 % (ref 11.5–15.5)
WBC: 14.6 K/uL — ABNORMAL HIGH (ref 4.0–10.5)
nRBC: 0 % (ref 0.0–0.2)

## 2024-11-13 LAB — BASIC METABOLIC PANEL WITH GFR
Anion gap: 12 (ref 5–15)
BUN: 10 mg/dL (ref 8–23)
CO2: 23 mmol/L (ref 22–32)
Calcium: 9 mg/dL (ref 8.9–10.3)
Chloride: 102 mmol/L (ref 98–111)
Creatinine, Ser: 0.5 mg/dL (ref 0.44–1.00)
GFR, Estimated: >60 mL/min
Glucose, Bld: 156 mg/dL — ABNORMAL HIGH (ref 70–99)
Potassium: 3.6 mmol/L (ref 3.5–5.1)
Sodium: 137 mmol/L (ref 135–145)

## 2024-11-13 LAB — BLOOD GAS, ARTERIAL
Acid-Base Excess: 6.4 mmol/L — ABNORMAL HIGH (ref 0.0–2.0)
Bicarbonate: 29.2 mmol/L — ABNORMAL HIGH (ref 20.0–28.0)
O2 Saturation: 97.9 %
Patient temperature: 37.5
pCO2 arterial: 36 mmHg (ref 32–48)
pH, Arterial: 7.52 — ABNORMAL HIGH (ref 7.35–7.45)
pO2, Arterial: 78 mmHg — ABNORMAL LOW (ref 83–108)

## 2024-11-13 LAB — URINALYSIS, ROUTINE W REFLEX MICROSCOPIC
Bilirubin Urine: NEGATIVE
Glucose, UA: NEGATIVE mg/dL
Hgb urine dipstick: NEGATIVE
Ketones, ur: NEGATIVE mg/dL
Leukocytes,Ua: NEGATIVE
Nitrite: NEGATIVE
Protein, ur: NEGATIVE mg/dL
Specific Gravity, Urine: 1.004 — ABNORMAL LOW (ref 1.005–1.030)
pH: 7 (ref 5.0–8.0)

## 2024-11-13 MED ORDER — FUROSEMIDE 10 MG/ML IJ SOLN
40.0000 mg | Freq: Once | INTRAMUSCULAR | Status: AC
  Administered 2024-11-13: 40 mg via INTRAVENOUS
  Filled 2024-11-13: qty 4

## 2024-11-13 MED ORDER — IPRATROPIUM-ALBUTEROL 0.5-2.5 (3) MG/3ML IN SOLN
3.0000 mL | Freq: Four times a day (QID) | RESPIRATORY_TRACT | Status: DC
  Administered 2024-11-14 – 2024-11-19 (×21): 3 mL via RESPIRATORY_TRACT
  Filled 2024-11-13 (×22): qty 3

## 2024-11-13 MED ORDER — ORAL CARE MOUTH RINSE: 15.0000 mL | OROMUCOSAL | Status: DC | PRN

## 2024-11-13 MED ORDER — PHENAZOPYRIDINE HCL 100 MG PO TABS
100.0000 mg | ORAL_TABLET | Freq: Three times a day (TID) | ORAL | Status: DC
  Administered 2024-11-13: 100 mg via ORAL
  Filled 2024-11-13 (×2): qty 1

## 2024-11-13 MED ORDER — IPRATROPIUM-ALBUTEROL 0.5-2.5 (3) MG/3ML IN SOLN
3.0000 mL | RESPIRATORY_TRACT | Status: DC | PRN
  Administered 2024-11-13 – 2024-11-22 (×2): 3 mL via RESPIRATORY_TRACT
  Filled 2024-11-13 (×4): qty 3

## 2024-11-13 MED ORDER — LORAZEPAM 0.5 MG PO TABS
0.5000 mg | ORAL_TABLET | Freq: Once | ORAL | Status: AC
  Administered 2024-11-13: 0.5 mg via ORAL
  Filled 2024-11-13: qty 1

## 2024-11-13 MED ORDER — CHLORHEXIDINE GLUCONATE CLOTH 2 % EX PADS
6.0000 | MEDICATED_PAD | Freq: Every day | CUTANEOUS | Status: DC
  Administered 2024-11-13 – 2024-11-29 (×15): 6 via TOPICAL

## 2024-11-13 NOTE — Progress Notes (Signed)
 Called to bedside for oxygen desaturations. NAD noted on my arrival. Patient had previously gotten OOB unassisted by staff and went to he bathroom without supplemental O2 and was found to have low O2 readings by RN.  Patient on 7L simple mask. Changed to [place] @ 6L/minute. SpO2 now 94% on 6L high flow canula. Patient states she feel a little better. RT will continue to follow.

## 2024-11-13 NOTE — Significant Event (Signed)
 Rapid Response Event Note   Reason for Call :  Increased oxygen requirements   Initial Focused Assessment:  Per bedside nurse, patient transferred from bed to [facility] and developed worsening hypoxia and oxygen Patient resting in bed with high flow nasal canula at 9 L. Able to talk in full sentences, states she feels very short of breath and anxious. Alert and oriented following commands, lungs diminished with fine crackles. Normal heart rate and rhythm, no murmur heard.  LPN at bedside, lasix administered with PRN Duoneb. Attempted to wean to 7L, but desaturated to 80%. 15 L NRB placed. Jadine MD notified, at bedside. Orders placed to transfer to SD for closer monitoring.   Husband and children at bedside and aware of transfer to 1238   Interventions:  Transfer to Stepdown   Event Summary:   MD Notified: Jadine MD  Call Time: 1703 Arrival Time: 1708 End Time: 1745  Barnie Clover, RN

## 2024-11-13 NOTE — Progress Notes (Signed)
" °  Progress Note   Patient: Anne [name] FMW:[id number] DOB: [birth year] DOA: 11/10/2024     2 DOS: the patient was seen and examined on 11/13/2024   Brief hospital course: 84 year old woman PMH metastatic breast cancer followed by [facility], with cough x 10 days, seen by urgent care 12/29 and prescribed doxycycline  and prednisone .  CT chest showed multifocal pneumonia, no PE.  Admitted for pneumonia, acute hypoxic respiratory failure.  Consultants None   Procedures/Events 12/31 admit for pneumonia  Assessment and Plan: Acute hypoxic respiratory failure with SpO2 in the 80s on room air Sepsis  Multifocal pneumonia COPD/emphysema COVID, flu, RSV panel negative.  Admission CT chest showed multifocal bronchopneumonia.  No PE. Follow-up CT in 3 months recommended to document resolution and exclude underlying mass left lower lobe, as well as follow-up pathologic right hilar adenopathy More hypoxic overnight, exam consistent with pulmonary edema.  Confirmed with chest x-ray.   Diurese and wean oxygen as tolerated.  Appears nontoxic at this time.  +3 L since admission.  Probably volume overload.  Do not suspect CHF at this time.  Reported dark stools overnight Significance unclear.  Hemoglobin stable.  Follow clinically.   Metastatic breast cancer Continue follow-up with [facility]   Dementia Stable     Subjective:  Desaturations overnight requiring increased oxygen requirement.  Fever overnight.  Per patient husband had a difficult night, nightmare, hypoxia.  This morning she does not feel well but has no specific complaints.  Physical Exam: Vitals:   11/13/24 0614 11/13/24 0932 11/13/24 1412 11/13/24 1507  BP:   118/64   Pulse:   89   Resp:   20   Temp:   99 F (37.2 C)   TempSrc:   Oral   SpO2: 95% 97% 95% 93%  Weight:      Height:       Physical Exam Vitals and nursing note reviewed.  Constitutional:      General: She is not in acute distress.    Appearance: She is not  ill-appearing or toxic-appearing.  Cardiovascular:     Rate and Rhythm: Normal rate and regular rhythm.     Heart sounds: No murmur heard. Pulmonary:     Effort: No respiratory distress.     Breath sounds: Rales present. No wheezing or rhonchi.     Comments: Mild increased respiratory effort.  Diffuse Rales. Musculoskeletal:     Right lower leg: No edema.     Left lower leg: No edema.  Neurological:     Mental Status: She is alert.  Psychiatric:        Mood and Affect: Mood normal.        Behavior: Behavior normal.     Data Reviewed: BMP unremarkable WBC 14.6 (on steroids) Hemoglobin stable Chest x-ray independently reviewed shows pulmonary edema  Family Communication: husband, son at bedside  Disposition: Status is: Inpatient Remains inpatient appropriate because: hypoxia     Time spent: 35 minutes  Author: Toribio Door, MD 11/13/2024 4:14 PM  For on call review [email or web address].    "

## 2024-11-13 NOTE — Progress Notes (Signed)
 Rapid response called and notified of patients worsening condition @1703 , upon entering room patient was very SOB O2 steadily 88 on 9L of high flow not able to return to baseline. Rapid response at bedside, IV lasix and PRN Neb treatment given, pt status still the same.   MD Jadine notified, patient transferred to ICU

## 2024-11-13 NOTE — Progress Notes (Signed)
" ° ° ° °  Patient Name: Anne [name]           DOB: 03/22/1941  MRN: [id number]      Admission Date: 11/10/2024  Attending Provider: Jadine Toribio SQUIBB, MD  Primary Diagnosis: Pneumonia   Level of care: Med-Surg   OVERNIGHT EVENT  Notified by bedside RN that patient is reporting dark/ black stool.  This started around 4pm, has had ~4 small dark colored/ black stools.  No abdominal pain, discomfort, distention, heartburn Hemoglobin 11.3--10.2.  Will recheck H&H   Plan: FOBT IV Protonix  H&H  Addendum: Hemoglobin 10.6, BP stable.    Alejo Beamer, DNP, ACNPC- AG [facility]    "

## 2024-11-13 NOTE — TOC Initial Note (Signed)
 Transition of Care [facility]) - Initial/Assessment Note    Patient Details  Name: Anne [name] MRN: [id number] Date of Birth: 02-May-1941  [facility]) CM/SW Contact:    Toy LITTIE Agar, RN Phone Number:[phone]  11/13/2024, 3:03 PM  Clinical Narrative:                 Inpatient care manager following patient with high risk for readmission. Cm at bedside introduces self and explained role. Patient , spouse and daughter engaged in conversation. Patient is from home where she functions independently at baseline. Patient reports that her PCP is ETTER Horton, Harlene LABOR, MD). Patient denies any HH or DME needs. Pharmacy of choice is [facility] but plans to change to [facility]. There are currently there are no inpatient care manager needs. Inpatient care manager to follow.   Expected Discharge Plan: Home/Self Care Barriers to Discharge: Continued Medical Work up   Patient Goals and CMS Choice Patient states their goals for this hospitalization and ongoing recovery are:: Wants to get better to be able to go home   Choice offered to / list presented to : NA [place] ownership interest in [facility].provided to::  (n/a)    Expected Discharge Plan and Services In-house Referral: NA Discharge Planning Services: CM Consult Post Acute Care Choice: NA Living arrangements for the past 2 months: Single Family Home                 DME Arranged: N/A DME Agency: NA       HH Arranged: NA HH Agency: NA        Prior Living Arrangements/Services Living arrangements for the past 2 months: Single Family Home Lives with:: Spouse Patient language and need for interpreter reviewed:: Yes Do you feel safe going back to the place where you live?: Yes      Need for Family Participation in Patient Care: Yes (Comment) Care giver support system in place?: Yes (comment)   Criminal Activity/Legal Involvement Pertinent to Current Situation/Hospitalization: No - Comment as needed  Activities  of Daily Living   ADL Screening (condition at time of admission) Independently performs ADLs?: Yes (appropriate for developmental age) Is the patient deaf or have difficulty hearing?: No Does the patient have difficulty seeing, even when wearing glasses/contacts?: No Does the patient have difficulty concentrating, remembering, or making decisions?: No  Permission Sought/Granted Permission sought to share information with : Family Supports Permission granted to share information with : Yes, Verbal Permission Granted  Share Information with NAME: Rola Lennon [phone]     Permission granted to share info w Relationship: spouse  Permission granted to share info w Contact Information: [phone]  Emotional Assessment Appearance:: Appears stated age Attitude/Demeanor/Rapport: Gracious Affect (typically observed): Pleasant Orientation: : Oriented to Self, Oriented to Place, Oriented to  Time, Oriented to Situation Alcohol / Substance Use: Not Applicable Psych Involvement: No (comment)  Admission diagnosis:  Pneumonia [J18.9] SIRS (systemic inflammatory response syndrome) (HCC) [R65.10] Acute respiratory failure with hypoxia (HCC) [J96.01] 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) [C79.81] Multifocal pneumonia [J18.8] Patient Active Problem List   Diagnosis Date Noted   Acute respiratory failure with hypoxia (HCC) 11/11/2024   Sepsis (HCC) 11/11/2024   Multifocal pneumonia 11/11/2024   COPD (chronic obstructive pulmonary disease) (HCC) 11/11/2024   Pneumonia 11/10/2024   Fall 06/26/2024   Gait instability 06/26/2024   Esophageal abnormality 06/26/2024   Intractable episodic tension-type headache 06/26/2024   Fracture of right acetabulum (HCC) 02/04/2023   Neuropathy 11/23/2022  Fibromyalgia 10/16/2022   Primary osteoarthritis of right knee 09/04/2022   Breast mass, left 05/03/2022   Severe depression (HCC) 03/08/2022   Memory changes 02/03/2022   Insomnia 12/28/2020    Vertigo 12/28/2020   Emphysema lung (HCC) 06/06/2020   RLS (restless legs syndrome) 02/24/2020   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   Facial trauma, sequela 08/09/2019   Hyperglycemia 08/19/2018   Mild intermittent asthma without complication 06/17/2018   Asthma 04/30/2018   Macular degeneration, 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   Palpitations 04/30/2018   Lumbar spondylosis 09/23/2017   Hypokalemia 08/11/2017   Abdominal pain 08/11/2017   SBO (small bowel obstruction) (HCC) 08/10/2017   History of right breast cancer 03/21/2017   Genetic testing 09/13/2016   Family history of breast cancer    Family history of pancreatic cancer    Family history of colon cancer    Pain in the chest 05/16/2016   Status post right breast reconstruction 05/11/2016   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) 05/11/2016   History of breast cancer in female 05/11/2016   Osteopenia determined by x-ray 05/11/2016   IBS (irritable bowel syndrome) 03/09/2016   Dyspnea 06/19/2015   Medicare annual wellness visit, subsequent 06/19/2015   Urinary frequency 12/12/2014   Breast cancer, right breast (HCC) 10/18/2014   Superficial thrombophlebitis 03/16/2014   Plant dermatitis 03/16/2014   Chest wall pain 02/07/2014   Primary osteoarthritis of both hips 01/21/2014   Elevated sed rate 08/02/2013   Dermatitis 11/23/2012   Falls 11/23/2012   Preventative health care 10/21/2012   Allergy  06/10/2012   Urinary incontinence 03/19/2012   Anemia 12/21/2011   Baker's cyst of knee 05/22/2011   Eosinophilic esophagitis 05/11/2011   Anxiety and depression 04/28/2011   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   Constipation, chronic 03/15/2011   Mixed hyperlipidemia 10/17/2010   CAROTID ARTERY STENOSIS 01/13/2010   GERD 09/29/2009   PERSONAL HX BREAST CANCER 09/29/2009   PCP:  Domenica Harlene LABOR, MD Pharmacy:   [facility] [phone] - [place], [place] - [address] AT [facility] [address] [place] [address] Phone: [phone] Fax: [phone]     Social Drivers of Health (SDOH) Social History: SDOH Screenings   Food Insecurity: No Food Insecurity (11/11/2024)  Housing: Low Risk (11/11/2024)  Transportation Needs: No Transportation Needs (11/11/2024)  Utilities: Not At Risk (11/11/2024)  Alcohol Screen: Low Risk (07/30/2024)  Depression (PHQ2-9): Low Risk (07/30/2024)  Financial Resource Strain: Low Risk (09/15/2024)  Recent Concern: Financial Resource Strain - Medium Risk (08/19/2024)  Physical Activity: Insufficiently Active (09/15/2024)  Social Connections: Moderately Integrated (11/11/2024)  Recent Concern: Social Connections - Moderately Isolated (09/15/2024)  Stress: No Stress Concern Present (09/15/2024)  Recent Concern: Stress - Stress Concern Present (08/19/2024)  Tobacco Use: Medium Risk (11/11/2024)  Health Literacy: Adequate Health Literacy (07/30/2024)   SDOH Interventions:     Readmission Risk Interventions    11/13/2024    2:23 PM  Readmission Risk Prevention Plan  Transportation Screening Complete  PCP or Specialist Appt within 3-5 Days Complete  HRI or Home Care Consult Complete  Social Work Consult for Recovery Care Planning/Counseling Complete  Palliative Care Screening Complete  Medication Review Oceanographer) Complete

## 2024-11-13 NOTE — Significant Event (Signed)
 Called to see the patient for persistent hypoxia, SpO2 high 80s now on 9 L with distress.  Patient seen, examined Feels better now but still short of breath.  Son, husband at bedside.  SpO2 88, 89 on 9 L oxygen Appears calm, uncomfortable but not toxic.  Mild distress. Cardiovascular.  Regular rate and rhythm.  No murmur, rub or gallop.  No lower extremity edema. Respiratory Rales multiple fields.  No rhonchi or wheezes.  Fair air movement.  Chest x-ray independently reviewed from earlier today showed pulmonary edema I/O incomplete but may be volume overloaded Had good urine output earlier today of reportedly a liter or more with Lasix  Assessment/plan  Worsening acute hypoxic respiratory failure, with escalating oxygen requirement now up to 9 L with poor recovery.  Will continue to treat with empiric antibiotics.  Clinically appears to be volume overloaded based on physical exam and chest x-ray findings.  CTA chest 72 hours ago was negative for PE, negative for complicating features.  Will give further diuretics, check ABG and transfer to stepdown.  BiPAP as needed.  Patient is full code.  Discussed in detail with husband and son.  Toribio Door, MD [facility]  Critical care time 35 minutes, respiratory distress, at risk for decompensation, some risk for intubation

## 2024-11-14 ENCOUNTER — Inpatient Hospital Stay (HOSPITAL_COMMUNITY)

## 2024-11-14 DIAGNOSIS — J810 Acute pulmonary edema: Secondary | ICD-10-CM | POA: Diagnosis not present

## 2024-11-14 DIAGNOSIS — J188 Other pneumonia, unspecified organism: Secondary | ICD-10-CM | POA: Diagnosis not present

## 2024-11-14 DIAGNOSIS — J9601 Acute respiratory failure with hypoxia: Secondary | ICD-10-CM

## 2024-11-14 DIAGNOSIS — J439 Emphysema, unspecified: Secondary | ICD-10-CM | POA: Diagnosis not present

## 2024-11-14 DIAGNOSIS — J701 Chronic and other pulmonary manifestations due to radiation: Secondary | ICD-10-CM

## 2024-11-14 DIAGNOSIS — J811 Chronic pulmonary edema: Secondary | ICD-10-CM

## 2024-11-14 LAB — CBC
HCT: 30 % — ABNORMAL LOW (ref 36.0–46.0)
Hemoglobin: 10.1 g/dL — ABNORMAL LOW (ref 12.0–15.0)
MCH: 30.9 pg (ref 26.0–34.0)
MCHC: 33.7 g/dL (ref 30.0–36.0)
MCV: 91.7 fL (ref 80.0–100.0)
Platelets: 256 K/uL (ref 150–400)
RBC: 3.27 MIL/uL — ABNORMAL LOW (ref 3.87–5.11)
RDW: 13.7 % (ref 11.5–15.5)
WBC: 11.8 K/uL — ABNORMAL HIGH (ref 4.0–10.5)
nRBC: 0 % (ref 0.0–0.2)

## 2024-11-14 LAB — TROPONIN T, HIGH SENSITIVITY: Troponin T High Sensitivity: <15 ng/L (ref 0–19)

## 2024-11-14 LAB — BASIC METABOLIC PANEL WITH GFR
Anion gap: 12 (ref 5–15)
BUN: 13 mg/dL (ref 8–23)
CO2: 27 mmol/L (ref 22–32)
Calcium: 8.8 mg/dL — ABNORMAL LOW (ref 8.9–10.3)
Chloride: 95 mmol/L — ABNORMAL LOW (ref 98–111)
Creatinine, Ser: 0.64 mg/dL (ref 0.44–1.00)
GFR, Estimated: >60 mL/min
Glucose, Bld: 113 mg/dL — ABNORMAL HIGH (ref 70–99)
Potassium: 3.1 mmol/L — ABNORMAL LOW (ref 3.5–5.1)
Sodium: 135 mmol/L (ref 135–145)

## 2024-11-14 LAB — BLOOD GAS, ARTERIAL
Acid-Base Excess: 6.2 mmol/L — ABNORMAL HIGH (ref 0.0–2.0)
Bicarbonate: 29.5 mmol/L — ABNORMAL HIGH (ref 20.0–28.0)
Delivery systems: POSITIVE
Drawn by: 74501
Expiratory PAP: 5 cmH2O
FIO2: 60 %
Inspiratory PAP: 10 cmH2O
O2 Saturation: 93.9 %
Patient temperature: 37
RATE: 12 {breaths}/min
pCO2 arterial: 37 mmHg (ref 32–48)
pH, Arterial: 7.51 — ABNORMAL HIGH (ref 7.35–7.45)
pO2, Arterial: 63 mmHg — ABNORMAL LOW (ref 83–108)

## 2024-11-14 LAB — ECHOCARDIOGRAM COMPLETE
AR max vel: 2.49 cm2
AV Area VTI: 2.43 cm2
AV Area mean vel: 2.5 cm2
AV Mean grad: 4 mmHg
AV Peak grad: 8.1 mmHg
Ao pk vel: 1.42 m/s
Area-P 1/2: 4.15 cm2
Calc EF: 58.7 %
Height: 65 in
S' Lateral: 2.7 cm
Single Plane A2C EF: 58.4 %
Single Plane A4C EF: 56 %
Weight: 2080 [oz_av]

## 2024-11-14 LAB — PRO BRAIN NATRIURETIC PEPTIDE: Pro Brain Natriuretic Peptide: 429 pg/mL — ABNORMAL HIGH

## 2024-11-14 LAB — GLUCOSE, CAPILLARY: Glucose-Capillary: 116 mg/dL — ABNORMAL HIGH (ref 70–99)

## 2024-11-14 MED ORDER — METHYLPREDNISOLONE SODIUM SUCC 40 MG IJ SOLR
40.0000 mg | Freq: Every day | INTRAMUSCULAR | Status: DC
  Administered 2024-11-15 – 2024-11-28 (×14): 40 mg via INTRAVENOUS
  Filled 2024-11-14 (×14): qty 1

## 2024-11-14 MED ORDER — LORAZEPAM 2 MG/ML IJ SOLN
1.0000 mg | Freq: Once | INTRAMUSCULAR | Status: AC
  Administered 2024-11-14: 1 mg via INTRAVENOUS

## 2024-11-14 MED ORDER — ARFORMOTEROL TARTRATE 15 MCG/2ML IN NEBU
15.0000 ug | INHALATION_SOLUTION | Freq: Two times a day (BID) | RESPIRATORY_TRACT | Status: DC
  Administered 2024-11-14 – 2024-12-01 (×35): 15 ug via RESPIRATORY_TRACT
  Filled 2024-11-14 (×35): qty 2

## 2024-11-14 MED ORDER — HALOPERIDOL LACTATE 5 MG/ML IJ SOLN
2.0000 mg | Freq: Four times a day (QID) | INTRAMUSCULAR | Status: DC | PRN
  Administered 2024-11-14 – 2024-11-20 (×3): 2 mg via INTRAVENOUS
  Filled 2024-11-14 (×4): qty 1

## 2024-11-14 MED ORDER — DEXMEDETOMIDINE HCL IN NACL 200 MCG/50ML IV SOLN
INTRAVENOUS | Status: AC
  Filled 2024-11-14: qty 50

## 2024-11-14 MED ORDER — LORAZEPAM 2 MG/ML IJ SOLN: 2.0000 mg | Freq: Once | INTRAMUSCULAR | Status: DC

## 2024-11-14 MED ORDER — LORAZEPAM 2 MG/ML IJ SOLN: 1.0000 mg | Freq: Once | INTRAMUSCULAR | Status: DC

## 2024-11-14 MED ORDER — LORAZEPAM 2 MG/ML IJ SOLN
0.5000 mg | Freq: Four times a day (QID) | INTRAMUSCULAR | Status: AC | PRN
  Administered 2024-11-14: 0.5 mg via INTRAVENOUS
  Filled 2024-11-14 (×2): qty 1

## 2024-11-14 MED ORDER — FUROSEMIDE 10 MG/ML IJ SOLN
40.0000 mg | Freq: Once | INTRAMUSCULAR | Status: AC
  Administered 2024-11-14: 40 mg via INTRAVENOUS
  Filled 2024-11-14: qty 4

## 2024-11-14 MED ORDER — BUDESONIDE 0.25 MG/2ML IN SUSP
0.2500 mg | Freq: Two times a day (BID) | RESPIRATORY_TRACT | Status: DC
  Administered 2024-11-14 – 2024-11-17 (×7): 0.25 mg via RESPIRATORY_TRACT
  Filled 2024-11-14 (×7): qty 2

## 2024-11-14 MED ORDER — HALOPERIDOL LACTATE 5 MG/ML IJ SOLN
2.0000 mg | Freq: Once | INTRAMUSCULAR | Status: AC
  Administered 2024-11-14: 2 mg via INTRAVENOUS
  Filled 2024-11-14: qty 1

## 2024-11-14 MED ORDER — POTASSIUM CHLORIDE 10 MEQ/100ML IV SOLN
10.0000 meq | INTRAVENOUS | Status: AC
  Administered 2024-11-14 (×4): 10 meq via INTRAVENOUS
  Filled 2024-11-14 (×4): qty 100

## 2024-11-14 MED ORDER — DEXMEDETOMIDINE HCL IN NACL 200 MCG/50ML IV SOLN
0.0000 ug/kg/h | INTRAVENOUS | Status: DC
  Administered 2024-11-14: 1.2 ug/kg/h via INTRAVENOUS
  Administered 2024-11-14: 0.6 ug/kg/h via INTRAVENOUS
  Administered 2024-11-14: 0.4 ug/kg/h via INTRAVENOUS
  Administered 2024-11-14 – 2024-11-15 (×3): 1.2 ug/kg/h via INTRAVENOUS
  Administered 2024-11-15: 0.8 ug/kg/h via INTRAVENOUS
  Administered 2024-11-15 (×2): 1.2 ug/kg/h via INTRAVENOUS
  Administered 2024-11-15: 0.8 ug/kg/h via INTRAVENOUS
  Administered 2024-11-15: 1.2 ug/kg/h via INTRAVENOUS
  Filled 2024-11-14 (×11): qty 50

## 2024-11-14 NOTE — Progress Notes (Signed)
 "  NAME:  Anne [name], MRN:  [id number], DOB:  September 22, 1941, LOS: 3 ADMISSION DATE:  11/10/2024, CONSULTATION DATE:  11/15/23 REFERRING MD:  Stefano Horns, MD CHIEF COMPLAINT:  Worsening hypoxemia   History of Present Illness:  84 year old female recently seen in urgent cough 12/29 for cough however admitted on 12/31 for acute hypoxemic respiratory failure secondary to multifocal pneumonia. Covid, RSV and flu negative. CTA with infiltrates in RUL and LLL with background emphysema. ABG 7.52/36/78. WBC 13, improved to 11.8. Stable anemia 10.  This evening transferred to SDU due to worsening hypoxemia requiring HHFNC. CXR yesterday and today with increase interstitial prominence suggestive of pulmonary edema. IVF discontinued on 11/12/24. Lasix  given 40 mg x 3 in the last 24 hours. Already on empiric antibiotics with azithromycin  and ceftriaxone .  Overnight was placed on BiPAP however having difficulty maintaining mask. When mask is removed she has desaturations. Given 0.5 mg ativan  IV once. Family maintains full code status to TRH. PCCM consulted for airway watch and considering of PRN vs continuous IV sedation.  Of note she was previously followed at [facility] but lost to follow-up. Has tried Trelegy, Symbicort  and budesonide  but self discontinued inhalers due to hoarseness or ineffectiveness however only tried for short periods. Her dyspnea improved after starting iron therapy and doing well off inhalers per last note on 07/14/20.  Pertinent  Medical History  Metastatic breast cancer followed by [facility], s/p radiation, emphysema, dementia  Significant Hospital Events: Including procedures, antibiotic start and stop dates in addition to other pertinent events   12/31 Admitted 1/2 Transferred to SDU 1/3 PCCM consulted  Interim History / Subjective:   Attempted to come off BiPAP this morning but desaturations on high flow nasal cannula.  This was on Precedex  0.8, now at 1.2 with BiPAP back in  place  Objective    Blood pressure 132/81, pulse (!) 106, temperature 99.6 F (37.6 C), temperature source Axillary, resp. rate (!) 24, height 5' 5 (1.651 m), weight 59 kg, SpO2 95%.    FiO2 (%):  [50 %-100 %] 60 % PEEP:  [5 cmH20] 5 cmH20 Pressure Support:  [5 cmH20] 5 cmH20   Intake/Output Summary (Last 24 hours) at 11/14/2024 1048 Last data filed at 11/14/2024 9167 Gross per 24 hour  Intake 842.82 ml  Output 300 ml  Net 542.82 ml   Filed Weights   11/11/24 0749  Weight: 59 kg    Physical Exam: General: Elderly woman laying in bed.  BiPAP in place. HENT: BiPAP mask is in place.  No stridor, no apparent secretions Respiratory: Decreased in the bases.  No real crackles or wheezing.  Respiratory rate low 30s Cardiovascular: Regular, 90, no murmur GI: Nondistended with positive bowel sounds Extremities: No edema Neuro: She did not wake to voice or follow any commands.  Resolved problem list   Assessment and Plan   Acute hypoxemic respiratory failure with bilateral infiltrates, question 2/2 multifocal (worsening) pneumonia, acute cardiogenic pulm edema, acute lung injury, noninfectious pneumonitis in the setting of her cancer therapy COPD/Emphysema exacerbation (PFTs nl in 2021) Post-radiation in RUL -Continue BiPAP for now.  Would like to try to work to come off this morning 1/3.  May be able to do so if we simultaneously lighten sedation - Agree with empiric antibiotics as ordered.  Will work to recheck respiratory culture if able to obtain - Brovana , Pulmicort , DuoNebs as needed-Brovana , Pulmicort , DuoNebs as needed - Agree with diuresis as ordered. - Agree with plans for echocardiogram.  At risk for LV dysfunction depending on which chemotherapy she is receiving - Continue empiric corticosteroids for possible noninfectious pneumonitis - She is at risk for intubation given tenuous oxygenation, tenuous mental status and airway protection.  Discussed this with her sons at  bedside.  She would want MV if it were to stabilize her through a possible reversible process.  Acute encephalopathy, likely metabolic in the setting of her critical illness - Continue Precedex  for now, attempt to lighten  Hypokalemia -Following BMP   Labs   CBC: Recent Labs  Lab 11/10/24 1707 11/12/24 0704 11/12/24 2201 11/13/24 1012 11/14/24 0255  WBC 11.7* 13.4*  --  14.6* 11.8*  HGB 11.3* 10.2* 10.6* 10.0* 10.1*  HCT 34.2* 31.1* 32.2* 30.8* 30.0*  MCV 93.2 93.4  --  91.9 91.7  PLT 294 241  --  250 256    Basic Metabolic Panel: Recent Labs  Lab 11/10/24 1707 11/12/24 0704 11/13/24 1012 11/14/24 0255  NA 137 136 137 135  K 4.0 3.9 3.6 3.1*  CL 100 101 102 95*  CO2 24 24 23 27   GLUCOSE 90 105* 156* 113*  BUN 16 10 10 13   CREATININE 0.62 0.52 0.50 0.64  CALCIUM  9.3 8.7* 9.0 8.8*   GFR: Estimated Creatinine Clearance: 48.8 mL/min (by C-G formula based on SCr of 0.64 mg/dL). Recent Labs  Lab 11/10/24 1707 11/10/24 1830 11/12/24 0704 11/13/24 1012 11/14/24 0255  WBC 11.7*  --  13.4* 14.6* 11.8*  LATICACIDVEN  --  1.4  --   --   --     Liver Function Tests: No results for input(s): AST, ALT, ALKPHOS, BILITOT, PROT, ALBUMIN in the last 168 hours. No results for input(s): LIPASE, AMYLASE in the last 168 hours. No results for input(s): AMMONIA in the last 168 hours.  ABG    Component Value Date/Time   PHART 7.51 (H) 11/14/2024 0300   PCO2ART 37 11/14/2024 0300   PO2ART 63 (L) 11/14/2024 0300   HCO3 29.5 (H) 11/14/2024 0300   O2SAT 93.9 11/14/2024 0300     Coagulation Profile: No results for input(s): INR, PROTIME in the last 168 hours.  Cardiac Enzymes: No results for input(s): CKTOTAL, CKMB, CKMBINDEX, TROPONINI in the last 168 hours.  HbA1C: Hgb A1c MFr Bld  Date/Time Value Ref Range Status  04/04/2023 04:13 PM 6.0 4.6 - 6.5 % Final    Comment:    Glycemic Control Guidelines for People with Diabetes:Non Diabetic:   <6%Goal of Therapy: <7%Additional Action Suggested:  >8%   08/29/2022 02:41 PM 6.0 4.6 - 6.5 % Final    Comment:    Glycemic Control Guidelines for People with Diabetes:Non Diabetic:  <6%Goal of Therapy: <7%Additional Action Suggested:  >8%     CBG: Recent Labs  Lab 11/14/24 0541  GLUCAP 116*    Home Medications  Prior to Admission medications  Medication Sig Start Date End Date Taking? Authorizing Provider  albuterol  (VENTOLIN  HFA) 108 (90 Base) MCG/ACT inhaler Inhale 2 puffs into the lungs every 6 (six) hours as needed for wheezing. 11/10/24 12/10/24 Yes [provider]  amitriptyline  (ELAVIL ) 25 MG tablet Take 4 tablets (100 mg total) by mouth at bedtime. 09/07/24  Yes Domenica Harlene LABOR, MD  aspirin  81 MG EC tablet Take 81 mg by mouth daily.   Yes [provider]  Brimonidine  Tartrate (LUMIFY ) 0.025 % SOLN Place 1 drop into both eyes daily.   Yes [provider]  capivasertib (TRUQAP) 200 MG tablet Take 200 mg (1 tablet)  twice daily with food, for 4 days followed by 3 days OFF of a 28 day cycle 10/27/24  Yes [provider]  diltiazem  (CARDIZEM ) 30 MG tablet As needed Patient taking differently: Take 30 mg by mouth daily as needed (afib). 08/27/23  Yes Vicci Rollo SAUNDERS, PA-C  FLUoxetine  (PROZAC ) 20 MG capsule Take 3 capsules (60 mg total) by mouth daily. 03/18/24  Yes Domenica Harlene LABOR, MD  LORazepam  (ATIVAN ) 1 MG tablet TAKE 1 TABLET(1 MG) BY MOUTH EVERY 8 HOURS AS NEEDED FOR ANXIETY Patient taking differently: Take 1 mg by mouth at bedtime. 10/19/24  Yes Webb, Padonda B, FNP  pantoprazole  (PROTONIX ) 40 MG tablet TAKE 1 TABLET(40 MG) BY MOUTH TWICE DAILY BEFORE A MEAL 11/02/24  Yes Avram Lupita BRAVO, MD  Pyridoxine HCl (B-6 PO) Take 1 tablet by mouth daily.   Yes [provider]  Thiamine  HCl (B-1 PO) Take 1 tablet by mouth daily.   Yes [provider]  UNABLE TO FIND Take 1,000 mg by mouth daily as needed (pain/fever). Med Name:  Tylenol  500 mg/15 ml   Yes [provider]  atorvastatin  (LIPITOR) 20 MG tablet Take 20 mg by mouth daily. Patient not taking: Reported on 11/12/2024    [provider]  benzonatate  (TESSALON ) 200 MG capsule Take by mouth. Patient not taking: Reported on 11/12/2024 10/28/24   [provider]  doxycycline  (VIBRAMYCIN ) 100 MG capsule Take 1 capsule (100 mg total) by mouth 2 (two) times daily for 10 days. Patient not taking: Reported on 11/12/2024 11/09/24 11/19/24  Ragan, Michael, FNP  FLUoxetine  (PROZAC ) 40 MG capsule Take 40 mg by mouth daily. Patient not taking: Reported on 11/12/2024 11/02/24   [provider]  midodrine  (PROAMATINE ) 2.5 MG tablet Take 1 tablet (2.5 mg total) by mouth 3 (three) times daily with meals. (1st dose upon waking around 7:00 or 8:00 AM, 2nd dose at 11:00 AM or 12:00 PM, 3rd dose at 2:00 or 3:00 PM) Patient not taking: No sig reported 09/04/24   Okey Vina GAILS, MD  potassium chloride  (KLOR-CON ) 20 MEQ packet Take 40 mEq by mouth 2 (two) times daily for 7 days. Patient not taking: No sig reported 09/23/24 09/30/24  Mesner, Selinda, MD  predniSONE  (DELTASONE ) 10 MG tablet SMARTSIG:- Tablet(s) By Mouth - Patient not taking: Reported on 11/12/2024 11/09/24   [provider]     Critical care time: 35 min    Lamar Chris, MD, PhD 11/14/2024, 10:48 AM [facility] [phone] or if no answer before 7:00PM call [phone] For any issues after 7:00PM please call eLink [phone]      "

## 2024-11-14 NOTE — Consult Note (Addendum)
 "  NAME:  Anne [name], MRN:  [id number], DOB:  [birth year], LOS: 3 ADMISSION DATE:  11/10/2024, CONSULTATION DATE:  11/15/23 REFERRING MD:  Stefano Horns, MD CHIEF COMPLAINT:  Worsening hypoxemia   History of Present Illness:  84 year old female recently seen in urgent cough 12/29 for cough however admitted on 12/31 for acute hypoxemic respiratory failure secondary to multifocal pneumonia. Covid, RSV and flu negative. CTA with infiltrates in RUL and LLL with background emphysema. ABG 7.52/36/78. WBC 13, improved to 11.8. Stable anemia 10.  This evening transferred to SDU due to worsening hypoxemia requiring HHFNC. CXR yesterday and today with increase interstitial prominence suggestive of pulmonary edema. IVF discontinued on 11/12/24. Lasix  given 40 mg x 3 in the last 24 hours. Already on empiric antibiotics with azithromycin  and ceftriaxone .  Overnight was placed on BiPAP however having difficulty maintaining mask. When mask is removed she has desaturations. Given 0.5 mg ativan  IV once. Family maintains full code status to TRH. PCCM consulted for airway watch and considering of PRN vs continuous IV sedation.  Of note she was previously followed at [facility] but lost to follow-up. Has tried Trelegy, Symbicort  and budesonide  but self discontinued inhalers due to hoarseness or ineffectiveness however only tried for short periods. Her dyspnea improved after starting iron therapy and doing well off inhalers per last note on 07/14/20.  Pertinent  Medical History  Metastatic breast cancer followed by [facility], s/p radiation, emphysema, dementia  Significant Hospital Events: Including procedures, antibiotic start and stop dates in addition to other pertinent events   12/31 Admitted 1/2 Transferred to SDU 1/3 PCCM consulted  Interim History / Subjective:  As above  Objective    Blood pressure (!) 106/52, pulse 96, temperature 98.3 F (36.8 C), temperature source Axillary, resp. rate (!) 30, height  5' 5 (1.651 m), weight 59 kg, SpO2 95%.    FiO2 (%):  [44 %-100 %] 50 % PEEP:  [5 cmH20] 5 cmH20 Pressure Support:  [5 cmH20] 5 cmH20   Intake/Output Summary (Last 24 hours) at 11/14/2024 0435 Last data filed at 11/14/2024 0000 Gross per 24 hour  Intake 771.21 ml  Output --  Net 771.21 ml   Filed Weights   11/11/24 0749  Weight: 59 kg    Physical Exam: General: Elderly, chronically ill-appearing, confused, follows commands but intermittently agitated HENT: [place], AT, BiPAP in place Eyes: EOMI, no scleral icterus Respiratory: Diminished to auscultation bilaterally.  No crackles, wheezing or rales Cardiovascular: Mild tachycardia, RR, -M/R/G, no JVD GI: BS+, soft, nontender Extremities:-Edema,-tenderness Neuro: Awake, follows commands, confused, CNII-XII grossly intact  Resolved problem list   Assessment and Plan   Acute hypoxemic respiratory failure 2/2 multifocal pneumonia, pulm edema COPD/Emphysema exacerbation (PFTs nl in 2021) Post-radiation in RUL -BiPAP as tolerated. Goal SpO2 >88% -Transition back to HHFNC in am when able -S/p lasix  120 mg in the last 24 hours. Reassess in am -S/p IV ativan  0.5 mg with partial improvement -Will give ativan  1 mg now. If agitation not improved then will start agitation -High risk for intubation. Family wants full code -Remain in SDU/ICU for airway watch -Duonebs q6h -Add brovana  and pulmicort  BID -Continue prednisone  40 mg daily -Continue antibiotics with ceftriaxone  and azithromycin   Hypokalemia -Repleted   Labs   CBC: Recent Labs  Lab 11/10/24 1707 11/12/24 0704 11/12/24 2201 11/13/24 1012 11/14/24 0255  WBC 11.7* 13.4*  --  14.6* 11.8*  HGB 11.3* 10.2* 10.6* 10.0* 10.1*  HCT 34.2* 31.1* 32.2* 30.8* 30.0*  MCV 93.2 93.4  --  91.9 91.7  PLT 294 241  --  250 256    Basic Metabolic Panel: Recent Labs  Lab 11/10/24 1707 11/12/24 0704 11/13/24 1012 11/14/24 0255  NA 137 136 137 135  K 4.0 3.9 3.6 3.1*  CL 100 101  102 95*  CO2 24 24 23 27   GLUCOSE 90 105* 156* 113*  BUN 16 10 10 13   CREATININE 0.62 0.52 0.50 0.64  CALCIUM  9.3 8.7* 9.0 8.8*   GFR: Estimated Creatinine Clearance: 48.8 mL/min (by C-G formula based on SCr of 0.64 mg/dL). Recent Labs  Lab 11/10/24 1707 11/10/24 1830 11/12/24 0704 11/13/24 1012 11/14/24 0255  WBC 11.7*  --  13.4* 14.6* 11.8*  LATICACIDVEN  --  1.4  --   --   --     Liver Function Tests: No results for input(s): AST, ALT, ALKPHOS, BILITOT, PROT, ALBUMIN in the last 168 hours. No results for input(s): LIPASE, AMYLASE in the last 168 hours. No results for input(s): AMMONIA in the last 168 hours.  ABG    Component Value Date/Time   PHART 7.51 (H) 11/14/2024 0300   PCO2ART 37 11/14/2024 0300   PO2ART 63 (L) 11/14/2024 0300   HCO3 29.5 (H) 11/14/2024 0300   O2SAT 93.9 11/14/2024 0300     Coagulation Profile: No results for input(s): INR, PROTIME in the last 168 hours.  Cardiac Enzymes: No results for input(s): CKTOTAL, CKMB, CKMBINDEX, TROPONINI in the last 168 hours.  HbA1C: Hgb A1c MFr Bld  Date/Time Value Ref Range Status  04/04/2023 04:13 PM 6.0 4.6 - 6.5 % Final    Comment:    Glycemic Control Guidelines for People with Diabetes:Non Diabetic:  <6%Goal of Therapy: <7%Additional Action Suggested:  >8%   08/29/2022 02:41 PM 6.0 4.6 - 6.5 % Final    Comment:    Glycemic Control Guidelines for People with Diabetes:Non Diabetic:  <6%Goal of Therapy: <7%Additional Action Suggested:  >8%     CBG: No results for input(s): GLUCAP in the last 168 hours.  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), Clotting disorder, COPD (chronic obstructive pulmonary disease) (HCC), Dementia (HCC), Dermatitis (11/23/2012), Dyspnea, EE (eosinophilic esophagitis), Emphysema of lung (HCC), Esophageal ring, ESOPHAGEAL STRICTURE (03/29/2009), Fall (11/23/2012), Family history of breast cancer, Family history of colon cancer, Family history of ovarian cancer, Family history of pancreatic cancer, Folliculitis of nose (12/31/2011), GERD (gastroesophageal reflux disease) (09/29/2009), History of chicken pox, History of measles, History of shingles, echocardiogram, Hyperlipidemia, Hypertension, Knee pain, bilateral (07/23/2011), Medicare annual wellness visit, subsequent (06/19/2015), Mixed hyperlipidemia (10/17/2010), Orthostasis, Osteopenia (03/14/2011), Osteoporosis, Personal history of chemotherapy (2001), Personal history of radiation therapy (2001), PERSONAL HX BREAST CANCER (09/29/2009), Pneumonia, PVC's (premature ventricular contractions), Radial neck fracture (10/2011), Substance abuse (HCC), Urinary incontinence (03/19/2012), and Vaginitis (05/22/2011).   Surgical History:   Past Surgical History:  Procedure Laterality Date   APPENDECTOMY     AUGMENTATION MAMMAPLASTY Right 05/27/2007   BREAST BIOPSY Right 01/02/2007   wire loc   BREAST BIOPSY  12/26/2006   BREAST LUMPECTOMY Right 2001   BREAST RECONSTRUCTION  2008, 2009, 2010   BREAST REDUCTION WITH MASTOPEXY  Left 05/30/2017   Procedure: LEFT BREAST REDUCTION FOR SYMTERY WITH MASTOPEXY;  Surgeon: Lowery Estefana RAMAN, DO;  Location: [facility];  Service: Plastics;  Laterality: Left;   CHOLECYSTECTOMY  2010   COLONOSCOPY  09/05/10   cecal avm's   DENTAL SURGERY  05/2016   4 dental implants by Dr. Dorthula.   ERCP  2010    CBD stone extraction    ESOPHAGOGASTRODUODENOSCOPY  01/08/2012   Procedure: ESOPHAGOGASTRODUODENOSCOPY (EGD);   Surgeon: Lupita FORBES Commander, MD;  Location: [facility];  Service: Endoscopy;  Laterality: N/A;   ESOPHAGOGASTRODUODENOSCOPY (EGD) WITH ESOPHAGEAL DILATION  2010, 2012   LAPAROSCOPIC APPENDECTOMY N/A 08/04/2016   Procedure: APPENDECTOMY LAPAROSCOPIC;  Surgeon: Elspeth Schultze, MD;  Location: [facility];  Service: General;  Laterality: N/A;   LIPOSUCTION WITH LIPOFILLING Left 11/21/2017   Procedure: LIPOSUCTION FROM ABDOMEN WITH LIPOFILLING TO LEFT BREAST;  Surgeon: Lowery Estefana RAMAN, DO;  Location: [facility];  Service: Plastics;  Laterality: Left;   MASTECTOMY MODIFIED RADICAL Right 05/27/2007   , Mastectomy modified radical (08), breast reconstruction, CA lesions excised lateral abd wall 2010   MASTOPEXY Left 11/21/2017   Procedure: LEFT BREAST REVISION MASTOPEXY FOR SYMMETRY;  Surgeon: Lowery Estefana RAMAN, DO;  Location: [facility];  Service: Plastics;  Laterality: Left;   REDUCTION MAMMAPLASTY Left    SAVORY DILATION  01/08/2012   Procedure: SAVORY DILATION;  Surgeon: Lupita FORBES Commander, MD;  Location: [facility];  Service: Endoscopy;  Laterality: N/A;  need xray     Social History:   reports that she quit smoking about 17 years ago. Her smoking use included cigarettes. She started smoking about 67 years ago. She has a 50 pack-year smoking history. She has never used smokeless tobacco. She reports that she does not currently use alcohol. She reports that she does not use drugs.   Family History:  Her family history includes Alcohol abuse in her maternal grandfather; Anxiety disorder in her sister; Arthritis in her sister; Breast cancer in her cousin, cousin, maternal aunt, maternal aunt, maternal aunt, and paternal aunt; Cancer in her maternal uncle; Cirrhosis in her sister; Esophageal cancer in her paternal grandfather; Heart disease in her father and paternal grandfather; Lung cancer in her father and paternal aunt; Osteoporosis in her sister and sister; Other in her  mother; Ovarian cancer (age of onset: 18) in her maternal aunt; Pancreatic cancer in her maternal aunt and maternal uncle; Rectal cancer in her paternal grandfather; Skin cancer in her sister; Stomach cancer in her maternal uncle; Stroke in her maternal grandmother. There is no history of Anesthesia problems, Hypotension, Malignant hyperthermia, or Pseudochol deficiency.   Allergies Allergies[1]   Home Medications  Prior to Admission medications  Medication Sig Start Date End Date Taking? Authorizing Provider  albuterol  (VENTOLIN  HFA) 108 (90 Base) MCG/ACT inhaler Inhale 2 puffs into the lungs every 6 (six) hours as needed for wheezing. 11/10/24 12/10/24 Yes [provider]  amitriptyline  (ELAVIL ) 25 MG tablet Take 4 tablets (100 mg total) by mouth at bedtime. 09/07/24  Yes Domenica Harlene LABOR, MD  aspirin  81 MG EC tablet Take 81 mg by mouth daily.   Yes [provider]  Brimonidine  Tartrate (LUMIFY ) 0.025 % SOLN Place 1 drop into both eyes daily.   Yes [provider]  capivasertib (TRUQAP) 200 MG tablet Take 200 mg (1 tablet) twice daily with food, for 4 days followed by 3 days OFF of a 28 day cycle 10/27/24  Yes [provider]  diltiazem  (CARDIZEM ) 30 MG tablet As needed Patient taking differently: Take 30 mg by mouth daily as needed (afib). 08/27/23  Yes Vicci Rollo SAUNDERS, PA-C  FLUoxetine  (PROZAC ) 20 MG capsule Take 3 capsules (60 mg total) by mouth daily. 03/18/24  Yes Domenica Harlene LABOR, MD  LORazepam  (ATIVAN ) 1 MG tablet TAKE 1 TABLET(1 MG) BY MOUTH EVERY 8 HOURS AS NEEDED FOR ANXIETY Patient taking differently: Take 1 mg by mouth at bedtime. 10/19/24  Yes Webb, Padonda B, FNP  pantoprazole  (PROTONIX ) 40 MG tablet TAKE 1 TABLET(40 MG) BY MOUTH TWICE DAILY BEFORE A MEAL 11/02/24  Yes Avram Lupita BRAVO, MD  Pyridoxine HCl (B-6 PO) Take 1 tablet by mouth daily.   Yes [provider]  Thiamine  HCl (B-1 PO) Take 1 tablet by mouth daily.   Yes [provider]  UNABLE TO FIND Take 1,000 mg by mouth daily as needed (pain/fever). Med Name: Tylenol  500 mg/15 ml   Yes [provider]  atorvastatin  (LIPITOR) 20 MG tablet Take 20 mg by mouth daily. Patient not taking: Reported on 11/12/2024    [provider]  benzonatate  (TESSALON ) 200 MG capsule Take by mouth. Patient not taking: Reported on 11/12/2024 10/28/24   [provider]  doxycycline  (VIBRAMYCIN ) 100 MG capsule Take 1 capsule (100 mg total) by mouth 2 (two) times daily for 10 days. Patient not taking: Reported on 11/12/2024 11/09/24 11/19/24  Ragan, Michael, FNP  FLUoxetine  (PROZAC ) 40 MG capsule Take 40 mg by mouth daily. Patient not taking: Reported on 11/12/2024 11/02/24   [provider]  midodrine  (PROAMATINE ) 2.5 MG tablet Take 1 tablet (2.5 mg total) by mouth 3 (three) times daily with meals. (1st dose upon waking around 7:00 or 8:00 AM, 2nd dose at 11:00 AM or 12:00 PM, 3rd dose at 2:00 or 3:00 PM) Patient not taking: No sig reported 09/04/24   Okey Vina GAILS, MD  potassium chloride  (KLOR-CON ) 20 MEQ packet Take 40 mEq by mouth 2 (two) times daily for 7 days. Patient not taking: No sig reported 09/23/24 09/30/24  Mesner, Selinda, MD  predniSONE  (DELTASONE ) 10 MG tablet SMARTSIG:- Tablet(s) By Mouth - Patient not taking: Reported on 11/12/2024 11/09/24   [provider]     Critical care time: 30 min    Slater Staff, M.D. [facility] 11/14/2024 5:22 AM   See Amion for personal pager For hours between 7 PM to 7 AM, please call Elink for urgent questions           [1]  Allergies Allergen Reactions   Sorbitol Other (See Comments)    GI Issues   Tomato Diarrhea   Zofran  Other (See Comments)    headache   Advil  [Ibuprofen ] Other (See Comments)    Irritates throat.   Cephalexin  Other (See Comments)    dizziness   Diphenhydramine  Hcl Other (See Comments)    nervous and upset does okay w/ cream    Morphine  And Codeine Other (See Comments)    Causes depression   Tramadol  Other (See Comments)    Dizziness   Antihistamines, Chlorpheniramine-Type Anxiety   Oxycodone  Anxiety    Confusion with inability to think clearly   "

## 2024-11-14 NOTE — Plan of Care (Signed)
 Patients mentation has improved during shift after some rest.  Currently on HHFNC with no non rebreather.  Disoriented to place and situation but familiar with family at bedside.

## 2024-11-14 NOTE — Progress Notes (Addendum)
 " Progress Note   Patient: Anne [name] FMW:[id number] DOB: 03-May-1941 DOA: 11/10/2024     3 DOS: the patient was seen and examined on 11/14/2024   Brief hospital course: 84 year old woman PMH metastatic breast cancer followed by [facility], with cough x 10 days, seen by urgent care 12/29 and prescribed doxycycline  and prednisone .  CT chest showed multifocal pneumonia, no PE.  Admitted for pneumonia, acute hypoxic respiratory failure.  Treated with empiric antibiotics with initial clinical improvement, subsequently decompensated with higher oxygen requirement, chest x-ray showing pulmonary edema, transferred to SDU, started on BiPAP, Precedex  for sedation, PCCM consultation.  Consultants PCCM 1/3   Procedures/Events 12/31 admit for pneumonia, treated w/ abx 1/1 clinically improving during day; overnight higher oxygen requirement 1/2 no distress, but more hypoxia, difficulty recovering, desatting w/ movement; CXR pulm edema, treated w/ Lasix , good UOP but no improvement in clinical status, transferred to SDU for HHFNC,  1/3 early am, BiPAP, needed Precedex  to tolerate, PCCM consulted  Assessment and Plan: Acute hypoxic respiratory failure upon admission Sepsis secondary to Multifocal pneumonia COPD/emphysema Acute pulmonary edema COVID, flu, RSV panel negative.  Admission CT chest showed multifocal bronchopneumonia.  No PE. Follow-up CT in 3 months recommended to document resolution and exclude underlying mass left lower lobe, as well as follow-up pathologic right hilar adenopathy Decompensated last evening, transferred to stepdown.  BiPAP and Precedex  currently.  Critical care consulted.  Discussed with Dr. Shelah.   Will see if she can wean off BiPAP this morning, continue Precedex  for now which she appears to be tolerating.  WBC is down she is afebrile and I think responding clinically to antibiotics. Chest x-ray again today shows what appears to be pulmonary edema without any improvement if  not worse Further Lasix  today, check echo and troponin, EKG to rule out acute cardiac process.  She has no signs or symptoms of ACS. Given her agitation, inability to tolerate PureWick, and desatting with the slightest movement, placed Foley catheter for critical illness   Metastatic breast cancer Continue follow-up with [facility]   Dementia Acute delirium secondary to critical illness Mild dementia at baseline, recognizes family, remembered physician prior to becoming severely ill.  Dominant issue at this point is delirium.  Patient is full code and her prognosis is guarded with hypoxic respiratory failure and pulmonary edema.  Currently requiring BiPAP and Precedex .  Remains critically ill.  Discussed with Dr. Shelah, appreciate critical care expertise.  Reported dark stools 1/1 Significance unclear.  Hemoglobin stable.  No further evaluation suggested    Subjective:  Overnight critical care was consulted and she was started on BiPAP but needed Precedex , still having difficulty tolerating this.  Alert and oriented to family this morning but confused and agitated, trying to pull mask off.  Wants to get up to urinate.  Physical Exam: Vitals:   11/14/24 0400 11/14/24 0401 11/14/24 0500 11/14/24 0600  BP: 137/61   132/81  Pulse: 96 96 (!) 102 (!) 106  Resp: (!) 36 (!) 36 (!) 21 (!) 24  Temp:      TempSrc:      SpO2: 95% 95% 90% 91%  Weight:      Height:       Physical Exam Vitals and nursing note reviewed.  Constitutional:      General: She is not in acute distress.    Appearance: She is ill-appearing (Appears ill, restless, mildly agitated, trying to pull mask off, nontoxic). She is not toxic-appearing.  Cardiovascular:     Rate  and Rhythm: Normal rate and regular rhythm.     Heart sounds: No murmur heard. Pulmonary:     Effort: No respiratory distress.     Breath sounds: No wheezing, rhonchi or rales.     Comments: Lungs are clear and respiratory effort appears stable but she is  agitated, pulling at mask.  SpO2 in the mid 90s. Abdominal:     General: There is no distension.     Palpations: Abdomen is soft.     Tenderness: There is no abdominal tenderness. There is no guarding.  Musculoskeletal:     Right lower leg: No edema.     Left lower leg: No edema.  Neurological:     Mental Status: She is alert. She is disoriented.  Psychiatric:     Comments: Confused, restless, agitated     Data Reviewed: Afebrile 24 hours ABG this a.m. shows respiratory alkalosis, hypoxemia Potassium 3.1, creatinine within normal limits, anion gap within normal limits WBC trending down, 11.8, hemoglobin stable at 10.1, platelets within normal limits, urinalysis negative Repeat chest x-ray independently reviewed showing diffuse pulmonary edema  Family Communication: Daughter at bedside  Disposition: Status is: Inpatient Remains inpatient appropriate because: Critically ill, hypoxic, pulmonary edema     Time spent: 35 minutes  Author: Toribio Door, MD 11/14/2024 8:17 AM  For on call review [email or web address].    "

## 2024-11-14 NOTE — Progress Notes (Signed)
" ° ° ° °  Patient Name: Anne [name]           DOB: [birth year]  MRN: [id number]      Admission Date: 11/10/2024  Attending Provider: Kassie Acquanetta Bradley, MD  Primary Diagnosis: Pneumonia   Level of care: Stepdown   OVERNIGHT EVENT  The patient has a history of metastatic breast cancer, COPD/emphysema, asthma. Presented to the ED for evaluation of cough and chest discomfort. She was admitted with multifocal pneumonia and acute hypoxic respiratory failure. On admission, CT negative for PE.   ACUTE change during day shift --> patient desat easily and went from 6 L  to 60 L on heated high flow.  Chest x-ray concerning for pulmonary edema. She has received a total of 80 mg IV Lasix .   Tonight, RR 40's on HHFNC and easily desat with small movements or agitation. Now on Bipap on 60% Fio2 and is not tolerating it well bc of anxiety and restlessness.  She received Ativan  for anxiety, however she continues to intermittently attempt to remove BiPAP.   I have reached out to the PCCM as this patient is high risk of intubation given poor tolerance of BiPAP.  I have suggested we try Precedex  infusion while patient is on BiPAP to family, and they are open to trying this.  Family at bedside updated.   Sohil Timko, DNP, ACNPC- AG [facility]    "

## 2024-11-15 ENCOUNTER — Inpatient Hospital Stay (HOSPITAL_COMMUNITY)

## 2024-11-15 DIAGNOSIS — J810 Acute pulmonary edema: Secondary | ICD-10-CM | POA: Diagnosis not present

## 2024-11-15 DIAGNOSIS — J9601 Acute respiratory failure with hypoxia: Secondary | ICD-10-CM | POA: Diagnosis not present

## 2024-11-15 DIAGNOSIS — J439 Emphysema, unspecified: Secondary | ICD-10-CM | POA: Diagnosis not present

## 2024-11-15 DIAGNOSIS — J188 Other pneumonia, unspecified organism: Secondary | ICD-10-CM | POA: Diagnosis not present

## 2024-11-15 LAB — CBC
HCT: 29.7 % — ABNORMAL LOW (ref 36.0–46.0)
Hemoglobin: 9.9 g/dL — ABNORMAL LOW (ref 12.0–15.0)
MCH: 30.2 pg (ref 26.0–34.0)
MCHC: 33.3 g/dL (ref 30.0–36.0)
MCV: 90.5 fL (ref 80.0–100.0)
Platelets: 269 K/uL (ref 150–400)
RBC: 3.28 MIL/uL — ABNORMAL LOW (ref 3.87–5.11)
RDW: 14 % (ref 11.5–15.5)
WBC: 13.2 K/uL — ABNORMAL HIGH (ref 4.0–10.5)
nRBC: 0 % (ref 0.0–0.2)

## 2024-11-15 LAB — BASIC METABOLIC PANEL WITH GFR
Anion gap: 15 (ref 5–15)
BUN: 22 mg/dL (ref 8–23)
CO2: 26 mmol/L (ref 22–32)
Calcium: 8.2 mg/dL — ABNORMAL LOW (ref 8.9–10.3)
Chloride: 96 mmol/L — ABNORMAL LOW (ref 98–111)
Creatinine, Ser: 0.58 mg/dL (ref 0.44–1.00)
GFR, Estimated: >60 mL/min
Glucose, Bld: 114 mg/dL — ABNORMAL HIGH (ref 70–99)
Potassium: 3.3 mmol/L — ABNORMAL LOW (ref 3.5–5.1)
Sodium: 137 mmol/L (ref 135–145)

## 2024-11-15 LAB — BLOOD GAS, ARTERIAL
Acid-Base Excess: 7.2 mmol/L — ABNORMAL HIGH (ref 0.0–2.0)
Bicarbonate: 31.2 mmol/L — ABNORMAL HIGH (ref 20.0–28.0)
Drawn by: 11249
FIO2: 100 %
O2 Content: 60 L/min
O2 Saturation: 97.8 %
Patient temperature: 36.4
pCO2 arterial: 40 mmHg (ref 32–48)
pH, Arterial: 7.5 — ABNORMAL HIGH (ref 7.35–7.45)
pO2, Arterial: 71 mmHg — ABNORMAL LOW (ref 83–108)

## 2024-11-15 LAB — MAGNESIUM: Magnesium: 2.1 mg/dL (ref 1.7–2.4)

## 2024-11-15 LAB — POTASSIUM: Potassium: 3.8 mmol/L (ref 3.5–5.1)

## 2024-11-15 LAB — PHOSPHORUS: Phosphorus: 3.1 mg/dL (ref 2.5–4.6)

## 2024-11-15 MED ORDER — HYALURONIDASE HUMAN 150 UNIT/ML IJ SOLN
150.0000 [IU] | Freq: Once | INTRAMUSCULAR | Status: AC
  Administered 2024-11-15: 150 [IU] via SUBCUTANEOUS
  Filled 2024-11-15: qty 1

## 2024-11-15 MED ORDER — FUROSEMIDE 10 MG/ML IJ SOLN
40.0000 mg | Freq: Once | INTRAMUSCULAR | Status: AC
  Administered 2024-11-15: 40 mg via INTRAVENOUS
  Filled 2024-11-15: qty 4

## 2024-11-15 MED ORDER — SODIUM CHLORIDE 0.9 % IV SOLN
1.0000 g | Freq: Two times a day (BID) | INTRAVENOUS | Status: AC
  Administered 2024-11-15 – 2024-11-21 (×14): 1 g via INTRAVENOUS
  Filled 2024-11-15 (×14): qty 20

## 2024-11-15 MED ORDER — POTASSIUM CHLORIDE 10 MEQ/100ML IV SOLN
10.0000 meq | INTRAVENOUS | Status: AC
  Administered 2024-11-15 (×2): 10 meq via INTRAVENOUS
  Filled 2024-11-15: qty 100

## 2024-11-15 MED ORDER — POTASSIUM CHLORIDE 10 MEQ/100ML IV SOLN
10.0000 meq | INTRAVENOUS | Status: DC
  Administered 2024-11-15 (×2): 10 meq via INTRAVENOUS
  Filled 2024-11-15 (×2): qty 100

## 2024-11-15 NOTE — Progress Notes (Signed)
 " Progress Note   Patient: Anne [name] FMW:[id number] DOB: [birth year] DOA: 11/10/2024     4 DOS: the patient was seen and examined on 11/15/2024   Brief hospital course: 84 year old woman PMH metastatic breast cancer followed by [facility], with cough x 10 days, seen by urgent care 12/29 and prescribed doxycycline  and prednisone .  CT chest showed multifocal pneumonia, no PE.  Admitted for pneumonia, acute hypoxic respiratory failure.  Treated with empiric antibiotics with initial clinical improvement, subsequently decompensated with higher oxygen requirement, chest x-ray showing pulmonary edema, transferred to SDU, started on BiPAP, Precedex  for sedation, PCCM consultation.  Consultants PCCM 1/3   Procedures/Events 12/31 admit for pneumonia, treated w/ abx 1/1 clinically improving during day; overnight higher oxygen requirement 1/2 no distress, but more hypoxia, difficulty recovering, desatting w/ movement; CXR pulm edema, treated w/ Lasix , good UOP but no improvement in clinical status, transferred to SDU for HHFNC,  1/3 early am, BiPAP, needed Precedex  to tolerate, PCCM consulted.  Better day yesterday, weaned from BiPAP to high flow nasal cannula. 1/4 requiring high flow nasal cannula, nonrebreather with sats in the high 80s, restart BiPAP  Assessment and Plan: Acute hypoxic respiratory failure upon admission Sepsis secondary to Multifocal pneumonia COPD/emphysema Acute pulmonary edema COVID, flu, RSV panel negative.  Admission CT chest showed multifocal bronchopneumonia.  No PE. Follow-up CT in 3 months recommended to document resolution and exclude underlying mass left lower lobe, as well as follow-up pathologic right hilar adenopathy 2D echocardiogram reassuring Excellent response to Lasix , chest x-ray same or worse allowing for technique, at least some atelectasis.  Discussed with Dr. Shelah.  Intermittent BiPAP today.  Hopefully can avoid intubation.  Remain n.p.o. for now.  Need to  consider tube feeds.   Metastatic breast cancer Continue follow-up with [facility]   Dementia Acute delirium secondary to critical illness Mild dementia at baseline Delirium stable presently.  Prognosis remains guarded Reported dark stools 1/1 Significance unclear.  Hemoglobin stable.  No further evaluation suggested     Subjective:  Discussed with son at bedside, he stayed all night.  Patient was able to get some rest for a few hours overnight, was restless but less so compared to previous night.  She is wanting coffee.  Patient awake and denies complaints, breathing okay as long as she does not move.  Physical Exam: Vitals:   11/15/24 0752 11/15/24 0800 11/15/24 0813 11/15/24 0900  BP:  (!) 100/48  (!) 122/53  Pulse:  80  89  Resp:  (!) 24  (!) 37  Temp:   98.2 F (36.8 C)   TempSrc:   Axillary   SpO2: 93% 93%  (!) 89%  Weight:      Height:       Physical Exam Vitals and nursing note reviewed.  Constitutional:      General: She is not in acute distress.    Appearance: She is not ill-appearing or toxic-appearing.  Cardiovascular:     Rate and Rhythm: Normal rate and regular rhythm.     Heart sounds: No murmur heard. Pulmonary:     Breath sounds: Rales present. No wheezing or rhonchi.     Comments: Moderate respiratory effort, Rales posteriorly greater than anterior, able to speak in short sentences. Musculoskeletal:     Right lower leg: No edema.     Left lower leg: No edema.  Neurological:     Mental Status: She is alert.  Psychiatric:        Mood and Affect: Mood normal.  Behavior: Behavior normal.     Data Reviewed: ABG noted pH 7.5, pCO2 40, pO2 71 Potassium 3.3, creatinine stable 0.58 Excellent urine output 2800 Magnesium  and phosphorus within normal limits WBC modestly elevated 13.2 but patient on steroids Hemoglobin stable at 9.9 Chest x-ray dependently reviewed shows worsening bilateral airspace opacities right greater than left  Family  Communication: son at bedside  Disposition: Status is: Inpatient Remains inpatient appropriate because: High oxygen requirement, respiratory failure     Time spent: 24   Author: Toribio Door, MD 11/15/2024 9:19 AM  For on call review [email or web address].    "

## 2024-11-15 NOTE — Progress Notes (Signed)
 "  NAME:  Anne [name], MRN:  [id number], DOB:  1941/09/12, LOS: 4 ADMISSION DATE:  11/10/2024, CONSULTATION DATE:  11/15/23 REFERRING MD:  Stefano Horns, MD CHIEF COMPLAINT:  Worsening hypoxemia   History of Present Illness:  84 year old female recently seen in urgent cough 12/29 for cough however admitted on 12/31 for acute hypoxemic respiratory failure secondary to multifocal pneumonia. Covid, RSV and flu negative. CTA with infiltrates in RUL and LLL with background emphysema. ABG 7.52/36/78. WBC 13, improved to 11.8. Stable anemia 10.  This evening transferred to SDU due to worsening hypoxemia requiring HHFNC. CXR yesterday and today with increase interstitial prominence suggestive of pulmonary edema. IVF discontinued on 11/12/24. Lasix  given 40 mg x 3 in the last 24 hours. Already on empiric antibiotics with azithromycin  and ceftriaxone .  Overnight was placed on BiPAP however having difficulty maintaining mask. When mask is removed she has desaturations. Given 0.5 mg ativan  IV once. Family maintains full code status to TRH. PCCM consulted for airway watch and considering of PRN vs continuous IV sedation.  Of note she was previously followed at [facility] but lost to follow-up. Has tried Trelegy, Symbicort  and budesonide  but self discontinued inhalers due to hoarseness or ineffectiveness however only tried for short periods. Her dyspnea improved after starting iron therapy and doing well off inhalers per last note on 07/14/20.  Pertinent  Medical History  Metastatic breast cancer followed by [facility], s/p radiation, emphysema, dementia  Significant Hospital Events: Including procedures, antibiotic start and stop dates in addition to other pertinent events   12/31 Admitted 1/2 Transferred to SDU 1/3 PCCM consulted 1/3 echocardiogram > LVEF 60-65%, no regional wall motion abnormalities, grade 1 diastolic dysfunction, normal RV size and function.  PAP not estimated.  Interim History / Subjective:   - She had some improvement in mental status, was able to come off BiPAP after in 1/3.  Remains on heated high flow +1.00 mask with some evolving tachypnea and marginal SpO2 - Precedex  0.8 - I/O+ 2 L total, UOP 2800 cc last 24 hours  ABG on HH [facility] 1/4: 7.5/40/71/97% K+ 3.3  Objective    Blood pressure (!) 122/53, pulse 95, temperature 98.2 F (36.8 C), temperature source Axillary, resp. rate (!) 28, height 5' 5 (1.651 m), weight 59 kg, SpO2 96%.    FiO2 (%):  [60 %-100 %] 60 % PEEP:  [5 cmH20] 5 cmH20 Pressure Support:  [5 cmH20] 5 cmH20   Intake/Output Summary (Last 24 hours) at 11/15/2024 0925 Last data filed at 11/15/2024 0500 Gross per 24 hour  Intake 1001.41 ml  Output 2800 ml  Net -1798.59 ml   Filed Weights   11/11/24 0749  Weight: 59 kg    Physical Exam: General: Uncomfortable, mild tachypnea.  BiPAP is back in place HENT: BiPAP on, strong voice.  No stridor or secretions Respiratory: Decreased at the bases.  No wheezing Cardiovascular: Regular, 90s, no murmur GI: Nondistended with positive bowel sounds Extremities: No edema Neuro: Awake, answers questions.  She is frustrated and can be easily agitated.  Moving all extremities  Resolved problem list   Assessment and Plan   Acute hypoxemic respiratory failure with bilateral infiltrates, question 2/2 multifocal (worsening) pneumonia, acute cardiogenic pulm edema, acute lung injury, noninfectious pneumonitis in the setting of her cancer therapy COPD/Emphysema exacerbation (PFTs nl in 2021) Post-radiation in RUL -Was able to tolerate high flow nasal cannula plus mask overnight since afternoon 1/3 but believe we will need to continue to cycle on/off  BiPAP for work of breathing, hypoxemia.  Remains at high risk for intubation - Agree with continued diuresis.  She is still net positive but had good urine output last 24 hours.  Renal function tolerating.  Correct hypokalemia - Empiric antibiotics: Currently  ceftriaxone /azithromycin .  Favor broadening her ceftriaxone  empirically given lack of improvement, possible worsening on chest x-ray 1/4 > changed to imipenem.  Low threshold to add antifungals given her relative immunosuppressed state.  Certainly if she ultimately required intubation then we would perform BAL for culture data - Continue Brovana , Pulmicort , DuoNeb if needed - Agree with empiric corticosteroids for possible noninfectious pneumonitis - She remains at risk for intubation given tenuous oxygenation, tenuous mental status and airway protection.  Have discussed this with family at bedside.  She would want MV if it were to stabilize her through a possible reversible process.  Acute encephalopathy, likely metabolic in the setting of her critical illness - Continue Precedex  to facilitate BiPAP, lighten as able  Hypokalemia - Replete aggressively given diuresis and follow BMP   Labs   CBC: Recent Labs  Lab 11/10/24 1707 11/12/24 0704 11/12/24 2201 11/13/24 1012 11/14/24 0255 11/15/24 0711  WBC 11.7* 13.4*  --  14.6* 11.8* 13.2*  HGB 11.3* 10.2* 10.6* 10.0* 10.1* 9.9*  HCT 34.2* 31.1* 32.2* 30.8* 30.0* 29.7*  MCV 93.2 93.4  --  91.9 91.7 90.5  PLT 294 241  --  250 256 269    Basic Metabolic Panel: Recent Labs  Lab 11/10/24 1707 11/12/24 0704 11/13/24 1012 11/14/24 0255 11/15/24 0711  NA 137 136 137 135 137  K 4.0 3.9 3.6 3.1* 3.3*  CL 100 101 102 95* 96*  CO2 24 24 23 27 26   GLUCOSE 90 105* 156* 113* 114*  BUN 16 10 10 13 22   CREATININE 0.62 0.52 0.50 0.64 0.58  CALCIUM  9.3 8.7* 9.0 8.8* 8.2*  MG  --   --   --   --  2.1  PHOS  --   --   --   --  3.1   GFR: Estimated Creatinine Clearance: 48.8 mL/min (by C-G formula based on SCr of 0.58 mg/dL). Recent Labs  Lab 11/10/24 1830 11/12/24 0704 11/13/24 1012 11/14/24 0255 11/15/24 0711  WBC  --  13.4* 14.6* 11.8* 13.2*  LATICACIDVEN 1.4  --   --   --   --     Liver Function Tests: No results for input(s):  AST, ALT, ALKPHOS, BILITOT, PROT, ALBUMIN in the last 168 hours. No results for input(s): LIPASE, AMYLASE in the last 168 hours. No results for input(s): AMMONIA in the last 168 hours.  ABG    Component Value Date/Time   PHART 7.5 (H) 11/15/2024 0455   PCO2ART 40 11/15/2024 0455   PO2ART 71 (L) 11/15/2024 0455   HCO3 31.2 (H) 11/15/2024 0455   O2SAT 97.8 11/15/2024 0455     Coagulation Profile: No results for input(s): INR, PROTIME in the last 168 hours.  Cardiac Enzymes: No results for input(s): CKTOTAL, CKMB, CKMBINDEX, TROPONINI in the last 168 hours.  HbA1C: Hgb A1c MFr Bld  Date/Time Value Ref Range Status  04/04/2023 04:13 PM 6.0 4.6 - 6.5 % Final    Comment:    Glycemic Control Guidelines for People with Diabetes:Non Diabetic:  <6%Goal of Therapy: <7%Additional Action Suggested:  >8%   08/29/2022 02:41 PM 6.0 4.6 - 6.5 % Final    Comment:    Glycemic Control Guidelines for People with Diabetes:Non Diabetic:  <6%Goal of Therapy: <  7%Additional Action Suggested:  >8%     CBG: Recent Labs  Lab 11/14/24 0541  GLUCAP 116*    Home Medications  Prior to Admission medications  Medication Sig Start Date End Date Taking? Authorizing Provider  albuterol  (VENTOLIN  HFA) 108 (90 Base) MCG/ACT inhaler Inhale 2 puffs into the lungs every 6 (six) hours as needed for wheezing. 11/10/24 12/10/24 Yes [provider]  amitriptyline  (ELAVIL ) 25 MG tablet Take 4 tablets (100 mg total) by mouth at bedtime. 09/07/24  Yes Domenica Harlene LABOR, MD  aspirin  81 MG EC tablet Take 81 mg by mouth daily.   Yes [provider]  Brimonidine  Tartrate (LUMIFY ) 0.025 % SOLN Place 1 drop into both eyes daily.   Yes [provider]  capivasertib (TRUQAP) 200 MG tablet Take 200 mg (1 tablet) twice daily with food, for 4 days followed by 3 days OFF of a 28 day cycle 10/27/24  Yes [provider]  diltiazem  (CARDIZEM ) 30 MG tablet As  needed Patient taking differently: Take 30 mg by mouth daily as needed (afib). 08/27/23  Yes Vicci Rollo SAUNDERS, PA-C  FLUoxetine  (PROZAC ) 20 MG capsule Take 3 capsules (60 mg total) by mouth daily. 03/18/24  Yes Domenica Harlene LABOR, MD  LORazepam  (ATIVAN ) 1 MG tablet TAKE 1 TABLET(1 MG) BY MOUTH EVERY 8 HOURS AS NEEDED FOR ANXIETY Patient taking differently: Take 1 mg by mouth at bedtime. 10/19/24  Yes Webb, Padonda B, FNP  pantoprazole  (PROTONIX ) 40 MG tablet TAKE 1 TABLET(40 MG) BY MOUTH TWICE DAILY BEFORE A MEAL 11/02/24  Yes Avram Lupita BRAVO, MD  Pyridoxine HCl (B-6 PO) Take 1 tablet by mouth daily.   Yes [provider]  Thiamine  HCl (B-1 PO) Take 1 tablet by mouth daily.   Yes [provider]  UNABLE TO FIND Take 1,000 mg by mouth daily as needed (pain/fever). Med Name: Tylenol  500 mg/15 ml   Yes [provider]  atorvastatin  (LIPITOR) 20 MG tablet Take 20 mg by mouth daily. Patient not taking: Reported on 11/12/2024    [provider]  benzonatate  (TESSALON ) 200 MG capsule Take by mouth. Patient not taking: Reported on 11/12/2024 10/28/24   [provider]  doxycycline  (VIBRAMYCIN ) 100 MG capsule Take 1 capsule (100 mg total) by mouth 2 (two) times daily for 10 days. Patient not taking: Reported on 11/12/2024 11/09/24 11/19/24  Ragan, Michael, FNP  FLUoxetine  (PROZAC ) 40 MG capsule Take 40 mg by mouth daily. Patient not taking: Reported on 11/12/2024 11/02/24   [provider]  midodrine  (PROAMATINE ) 2.5 MG tablet Take 1 tablet (2.5 mg total) by mouth 3 (three) times daily with meals. (1st dose upon waking around 7:00 or 8:00 AM, 2nd dose at 11:00 AM or 12:00 PM, 3rd dose at 2:00 or 3:00 PM) Patient not taking: No sig reported 09/04/24   Okey Vina GAILS, MD  potassium chloride  (KLOR-CON ) 20 MEQ packet Take 40 mEq by mouth 2 (two) times daily for 7 days. Patient not taking: No sig reported 09/23/24 09/30/24  Mesner, Selinda, MD  predniSONE  (DELTASONE ) 10  MG tablet SMARTSIG:- Tablet(s) By Mouth - Patient not taking: Reported on 11/12/2024 11/09/24   [provider]     Critical care time: 35 min    Lamar Chris, MD, PhD 11/15/2024, 9:25 AM [facility] [phone] or if no answer before 7:00PM call [phone] For any issues after 7:00PM please call eLink [phone]      "

## 2024-11-16 ENCOUNTER — Inpatient Hospital Stay (HOSPITAL_COMMUNITY)

## 2024-11-16 DIAGNOSIS — J441 Chronic obstructive pulmonary disease with (acute) exacerbation: Secondary | ICD-10-CM

## 2024-11-16 DIAGNOSIS — J810 Acute pulmonary edema: Secondary | ICD-10-CM | POA: Diagnosis not present

## 2024-11-16 DIAGNOSIS — J188 Other pneumonia, unspecified organism: Secondary | ICD-10-CM | POA: Diagnosis not present

## 2024-11-16 DIAGNOSIS — E876 Hypokalemia: Secondary | ICD-10-CM | POA: Diagnosis not present

## 2024-11-16 DIAGNOSIS — G9340 Encephalopathy, unspecified: Secondary | ICD-10-CM | POA: Diagnosis not present

## 2024-11-16 DIAGNOSIS — J9601 Acute respiratory failure with hypoxia: Secondary | ICD-10-CM | POA: Diagnosis not present

## 2024-11-16 DIAGNOSIS — J439 Emphysema, unspecified: Secondary | ICD-10-CM | POA: Diagnosis not present

## 2024-11-16 LAB — CBC
HCT: 30.8 % — ABNORMAL LOW (ref 36.0–46.0)
Hemoglobin: 10.4 g/dL — ABNORMAL LOW (ref 12.0–15.0)
MCH: 30.8 pg (ref 26.0–34.0)
MCHC: 33.8 g/dL (ref 30.0–36.0)
MCV: 91.1 fL (ref 80.0–100.0)
Platelets: 292 K/uL (ref 150–400)
RBC: 3.38 MIL/uL — ABNORMAL LOW (ref 3.87–5.11)
RDW: 14.1 % (ref 11.5–15.5)
WBC: 15.1 K/uL — ABNORMAL HIGH (ref 4.0–10.5)
nRBC: 0 % (ref 0.0–0.2)

## 2024-11-16 LAB — CULTURE, BLOOD (ROUTINE X 2)
Culture: NO GROWTH
Culture: NO GROWTH
Special Requests: ADEQUATE
Special Requests: ADEQUATE

## 2024-11-16 LAB — BASIC METABOLIC PANEL WITH GFR
Anion gap: 14 (ref 5–15)
BUN: 32 mg/dL — ABNORMAL HIGH (ref 8–23)
CO2: 25 mmol/L (ref 22–32)
Calcium: 8.1 mg/dL — ABNORMAL LOW (ref 8.9–10.3)
Chloride: 100 mmol/L (ref 98–111)
Creatinine, Ser: 0.45 mg/dL (ref 0.44–1.00)
GFR, Estimated: >60 mL/min
Glucose, Bld: 142 mg/dL — ABNORMAL HIGH (ref 70–99)
Potassium: 3.7 mmol/L (ref 3.5–5.1)
Sodium: 139 mmol/L (ref 135–145)

## 2024-11-16 LAB — MAGNESIUM: Magnesium: 2.8 mg/dL — ABNORMAL HIGH (ref 1.7–2.4)

## 2024-11-16 LAB — MRSA NEXT GEN BY PCR, NASAL: MRSA by PCR Next Gen: NOT DETECTED

## 2024-11-16 MED ORDER — FUROSEMIDE 10 MG/ML IJ SOLN
40.0000 mg | Freq: Once | INTRAMUSCULAR | Status: AC
  Administered 2024-11-16: 40 mg via INTRAVENOUS
  Filled 2024-11-16: qty 4

## 2024-11-16 MED ORDER — MUSCLE RUB 10-15 % EX CREA
TOPICAL_CREAM | Freq: Two times a day (BID) | CUTANEOUS | Status: DC | PRN
  Administered 2024-11-17 – 2024-11-18 (×2): 1 via TOPICAL
  Filled 2024-11-16: qty 85

## 2024-11-16 MED ORDER — DEXMEDETOMIDINE HCL IN NACL 400 MCG/100ML IV SOLN
0.0000 ug/kg/h | INTRAVENOUS | Status: DC
  Administered 2024-11-16 (×2): 1.2 ug/kg/h via INTRAVENOUS

## 2024-11-16 MED ORDER — VANCOMYCIN HCL 1250 MG/250ML IV SOLN
1250.0000 mg | Freq: Once | INTRAVENOUS | Status: AC
  Administered 2024-11-16: 1250 mg via INTRAVENOUS
  Filled 2024-11-16: qty 250

## 2024-11-16 MED ORDER — TROLAMINE SALICYLATE 10 % EX CREA
TOPICAL_CREAM | Freq: Two times a day (BID) | CUTANEOUS | Status: DC | PRN
  Filled 2024-11-16 (×2): qty 85

## 2024-11-16 MED ORDER — POTASSIUM CHLORIDE 10 MEQ/100ML IV SOLN
10.0000 meq | INTRAVENOUS | Status: AC
  Administered 2024-11-16 (×2): 10 meq via INTRAVENOUS
  Filled 2024-11-16 (×2): qty 100

## 2024-11-16 MED ORDER — VANCOMYCIN HCL IN DEXTROSE 1-5 GM/200ML-% IV SOLN: 1000.0000 mg | INTRAVENOUS | Status: DC

## 2024-11-16 MED ORDER — ACETAMINOPHEN 160 MG/5ML PO SOLN
650.0000 mg | Freq: Four times a day (QID) | ORAL | Status: DC | PRN
  Administered 2024-11-16: 650 mg via ORAL
  Filled 2024-11-16: qty 20.3

## 2024-11-16 NOTE — Progress Notes (Signed)
 "  NAME:  Anne [name], MRN:  [id number], DOB:  [birth year], LOS: 5 ADMISSION DATE:  11/10/2024, CONSULTATION DATE:  11/15/23 REFERRING MD:  Stefano Horns, MD CHIEF COMPLAINT:  Worsening hypoxemia   History of Present Illness:  84 year old female recently seen in urgent cough 12/29 for cough however admitted on 12/31 for acute hypoxemic respiratory failure secondary to multifocal pneumonia. Covid, RSV and flu negative. CTA with infiltrates in RUL and LLL with background emphysema. ABG 7.52/36/78. WBC 13, improved to 11.8. Stable anemia 10.  This evening transferred to SDU due to worsening hypoxemia requiring HHFNC. CXR yesterday and today with increase interstitial prominence suggestive of pulmonary edema. IVF discontinued on 11/12/24. Lasix  given 40 mg x 3 in the last 24 hours. Already on empiric antibiotics with azithromycin  and ceftriaxone .  Overnight was placed on BiPAP however having difficulty maintaining mask. When mask is removed she has desaturations. Given 0.5 mg ativan  IV once. Family maintains full code status to TRH. PCCM consulted for airway watch and considering of PRN vs continuous IV sedation.  Of note she was previously followed at [facility] but lost to follow-up. Has tried Trelegy, Symbicort  and budesonide  but self discontinued inhalers due to hoarseness or ineffectiveness however only tried for short periods. Her dyspnea improved after starting iron therapy and doing well off inhalers per last note on 07/14/20.  Pertinent  Medical History  Metastatic breast cancer followed by [facility], s/p radiation, emphysema, dementia  Significant Hospital Events: Including procedures, antibiotic start and stop dates in addition to other pertinent events   12/31 Admitted 1/2 Transferred to SDU 1/3 PCCM consulted 1/3 echocardiogram > LVEF 60-65%, no regional wall motion abnormalities, grade 1 diastolic dysfunction, normal RV size and function.  PAP not estimated. 1/5 remains on 1.2 precedex     Interim History / Subjective:   900cc UOP 1/4 No AM labs Remains on precedex  at 1.2 On BiPAP overnight   Objective    Blood pressure (!) 176/80, pulse 86, temperature 97.6 F (36.4 C), temperature source Axillary, resp. rate (!) 27, height 5' 5 (1.651 m), weight 59 kg, SpO2 93%.    FiO2 (%):  [60 %-100 %] 100 % PEEP:  [5 cmH20] 5 cmH20 Pressure Support:  [5 cmH20] 5 cmH20   Intake/Output Summary (Last 24 hours) at 11/16/2024 0949 Last data filed at 11/16/2024 0900 Gross per 24 hour  Intake 728.3 ml  Output 1250 ml  Net -521.7 ml   Filed Weights   11/11/24 0749  Weight: 59 kg    Physical Exam: General: Chronically and critically ill elderly F  HENT: HHFNC. Pink tacky mm  Respiratory: Lung sounds are obscured by patient yelling with limited ability to redirect. When able to hear, no wheezes.  Cardiovascular: rr s1s2  GI: soft, generalized tenderness to palpation. Normoactive.  Extremities: no acute joint deformity, no pitting edema  Neuro: Awakens easily, when not agitated she follows commands  Psych: labile mood and affect with severe intermittent agitation. +visual hallucinations  Resolved problem list   Assessment and Plan    Acute hypoxic resp failure Multifocal PNA Cardiogenic pulm edema Pneumonitis 2/2 metastatic breast Ca tx, radiation  COPD with AECOPD -CXR 1/5 with some interval progression of GGO P -wean BiPAP as able for HHFNC -goal O2 > 88  -wean precedex  -would rec cont diuresis (I have deferred ordering 1/5 AM as labs are pending and I expect she will need K replacement) though dont think this is the biggest driver.  -cont steroids (already  on PPI)   -mero. Adding vanc. MRSA PCR was sent 1/2 and has not yet resulted. With progression of dx despite broadening gram neg coverage, think this is reasonable. Consider antifungals -cont triple therapy  -PT/OT, pulm hygiene   Acute encephalopathy, multifactorial  ICU delirium Sleep disturbance Sepsis   Steroids  P -add delirium precautions -spoke w family extensively re fq orientation etc  -wean precedex  as able  -will add PT/OT   Hypokalemia -BMP is pending 1/5   Labs   CBC: Recent Labs  Lab 11/12/24 0704 11/12/24 2201 11/13/24 1012 11/14/24 0255 11/15/24 0711 11/16/24 0708  WBC 13.4*  --  14.6* 11.8* 13.2* 15.1*  HGB 10.2* 10.6* 10.0* 10.1* 9.9* 10.4*  HCT 31.1* 32.2* 30.8* 30.0* 29.7* 30.8*  MCV 93.4  --  91.9 91.7 90.5 91.1  PLT 241  --  250 256 269 292    Basic Metabolic Panel: Recent Labs  Lab 11/12/24 0704 11/13/24 1012 11/14/24 0255 11/15/24 0711 11/15/24 1815 11/16/24 0708  NA 136 137 135 137  --  139  K 3.9 3.6 3.1* 3.3* 3.8 3.7  CL 101 102 95* 96*  --  100  CO2 24 23 27 26   --  25  GLUCOSE 105* 156* 113* 114*  --  142*  BUN 10 10 13 22   --  32*  CREATININE 0.52 0.50 0.64 0.58  --  0.45  CALCIUM  8.7* 9.0 8.8* 8.2*  --  8.1*  MG  --   --   --  2.1  --  2.8*  PHOS  --   --   --  3.1  --   --    GFR: Estimated Creatinine Clearance: 48.8 mL/min (by C-G formula based on SCr of 0.45 mg/dL). Recent Labs  Lab 11/10/24 1830 11/12/24 0704 11/13/24 1012 11/14/24 0255 11/15/24 0711 11/16/24 0708  WBC  --    < > 14.6* 11.8* 13.2* 15.1*  LATICACIDVEN 1.4  --   --   --   --   --    < > = values in this interval not displayed.    Liver Function Tests: No results for input(s): AST, ALT, ALKPHOS, BILITOT, PROT, ALBUMIN in the last 168 hours. No results for input(s): LIPASE, AMYLASE in the last 168 hours. No results for input(s): AMMONIA in the last 168 hours.  ABG    Component Value Date/Time   PHART 7.5 (H) 11/15/2024 0455   PCO2ART 40 11/15/2024 0455   PO2ART 71 (L) 11/15/2024 0455   HCO3 31.2 (H) 11/15/2024 0455   O2SAT 97.8 11/15/2024 0455     Coagulation Profile: No results for input(s): INR, PROTIME in the last 168 hours.  Cardiac Enzymes: No results for input(s): CKTOTAL, CKMB, CKMBINDEX, TROPONINI in  the last 168 hours.  HbA1C: Hgb A1c MFr Bld  Date/Time Value Ref Range Status  04/04/2023 04:13 PM 6.0 4.6 - 6.5 % Final    Comment:    Glycemic Control Guidelines for People with Diabetes:Non Diabetic:  <6%Goal of Therapy: <7%Additional Action Suggested:  >8%   08/29/2022 02:41 PM 6.0 4.6 - 6.5 % Final    Comment:    Glycemic Control Guidelines for People with Diabetes:Non Diabetic:  <6%Goal of Therapy: <7%Additional Action Suggested:  >8%     CBG: Recent Labs  Lab 11/14/24 0541  GLUCAP 116*     CRITICAL CARE Performed by: Ronnald FORBES Gave   Total critical care time: 42 minutes  Critical care time was exclusive of separately billable procedures  and treating other patients. Critical care was necessary to treat or prevent imminent or life-threatening deterioration.  Critical care was time spent personally by me on the following activities: development of treatment plan with patient and/or surrogate as well as nursing, discussions with consultants, evaluation of patient's response to treatment, examination of patient, obtaining history from patient or surrogate, ordering and performing treatments and interventions, ordering and review of laboratory studies, ordering and review of radiographic studies, pulse oximetry and re-evaluation of patient's condition.  Ronnald Gave MSN, AGACNP-BC [facility] Amion for pager  11/16/2024, 9:49 AM     "

## 2024-11-16 NOTE — Progress Notes (Signed)
 " Progress Note   Patient: Anne [name] FMW:[id number] DOB: 05-16-1941 DOA: 11/10/2024     5 DOS: the patient was seen and examined on 11/16/2024   Brief hospital course: 84 year old woman PMH metastatic breast cancer followed by [facility], with cough x 10 days, seen by urgent care 12/29 and prescribed doxycycline  and prednisone .  CT chest showed multifocal pneumonia, no PE.  Admitted for pneumonia, acute hypoxic respiratory failure.  Treated with empiric antibiotics with initial clinical improvement, subsequently decompensated with higher oxygen requirement, chest x-ray showing pulmonary edema, transferred to SDU, started on BiPAP, Precedex  for sedation, PCCM consultation.  Consultants PCCM 1/3   Procedures/Events 12/31 admit for pneumonia, treated w/ abx 1/1 clinically improving during day; overnight higher oxygen requirement 1/2 no distress, but more hypoxia, difficulty recovering, desatting w/ movement; CXR pulm edema, treated w/ Lasix , good UOP but no improvement in clinical status, transferred to SDU for HHFNC,  1/3 early am, BiPAP, needed Precedex  to tolerate, PCCM consulted.  Better day yesterday, weaned from BiPAP to high flow nasal cannula. 1/4 requiring high flow nasal cannula, nonrebreather with sats in the high 80s, restart BiPAP 1/5 still with substantial oxygen requirement, tenuous respiratory status.  Discussed with Dr. Jude, critical care will take over.  Assessment and Plan: Acute hypoxic respiratory failure upon admission Sepsis secondary to Multifocal pneumonia COPD/emphysema Acute pulmonary edema COVID, flu, RSV panel negative.  Admission CT chest showed multifocal bronchopneumonia.  No PE. Follow-up CT in 3 months recommended to document resolution and exclude underlying mass left lower lobe, as well as follow-up pathologic right hilar adenopathy 2D echocardiogram reassuring Not improving despite medical therapy.  Agree with addition of vancomycin .  Discussed with  critical care, they will take over at this time.  I have updated family at bedside.  I will check in on the patient and formally tomorrow.  Appreciate critical care.   Metastatic breast cancer Continue follow-up with [facility]   Dementia Acute delirium secondary to critical illness Mild dementia at baseline Delirium stable presently.   Prognosis remains guarded Reported dark stools 1/1 Significance unclear.  Hemoglobin stable.  No further evaluation suggested       Subjective:  Slept some last night, feels okay today  Physical Exam: Vitals:   11/16/24 0740 11/16/24 0758 11/16/24 0802 11/16/24 0806  BP:      Pulse:      Resp:      Temp: 97.6 F (36.4 C)     TempSrc: Axillary     SpO2:  94% 94% 93%  Weight:      Height:       Physical Exam Vitals reviewed.  Constitutional:      General: She is not in acute distress.    Appearance: She is not ill-appearing or toxic-appearing.  Cardiovascular:     Rate and Rhythm: Normal rate and regular rhythm.     Heart sounds: No murmur heard. Pulmonary:     Effort: No respiratory distress.     Breath sounds: No wheezing, rhonchi or rales.  Musculoskeletal:     Right lower leg: No edema.     Left lower leg: No edema.  Psychiatric:        Mood and Affect: Mood normal.        Behavior: Behavior normal.     Data Reviewed: BMP noted, magnesium  slightly high, hemoglobin stable 10.4, WBC 15.1 on steroid Chest x-ray appears worse today  Family Communication: Daughter at bedside  Disposition: Status is: Inpatient  Time spent: 20 minutes  Author: Toribio Door, MD 11/16/2024 10:22 AM  For on call review [email or web address].    "

## 2024-11-16 NOTE — Progress Notes (Signed)
 Pharmacy Antibiotic Note  Anne [name] is a 84 y.o. female admitted on 11/10/2024 with pneumonia. Patient has been on a plethora of antibiotics since 12/30 including cefepime , azithromycin , ceftriaxone , and now meropenem . Pharmacy has been consulted for vancomycin  dosing as well given patient's worsening clinical status (WBC up, worsening respiratory status). IV methylprednisolone  was started yesterday and may also cause an increase in WBC.  Plan: - Give vancomycin  1250mg   IV x1, then 1000mg  IV q24h (eAUC 524 using Scr rounded to 0.8, IBW, Vd 0.72) - Vanc levels PRN - Continue to monitor renal function, cultures, and overall clinical status - De-escalate abx as able  Height: 5' 5 (165.1 cm) Weight: 59 kg (130 lb) IBW/kg (Calculated) : 57  Temp (24hrs), Avg:97.7 F (36.5 C), Min:97.4 F (36.3 C), Max:98.2 F (36.8 C)  Recent Labs  Lab 11/10/24 1707 11/10/24 1830 11/12/24 0704 11/13/24 1012 11/14/24 0255 11/15/24 0711 11/16/24 0708  WBC 11.7*  --  13.4* 14.6* 11.8* 13.2* 15.1*  CREATININE 0.62  --  0.52 0.50 0.64 0.58  --   LATICACIDVEN  --  1.4  --   --   --   --   --     Estimated Creatinine Clearance: 48.8 mL/min (by C-G formula based on SCr of 0.58 mg/dL).    Allergies[1]  Antimicrobials this admission: 12/30 vancomycin  x1; 1/5 >>  12/30 cefepime  >> 12/31 12/31 ceftriaxone  >> 1/4 12/31 azithro >> 1/4 1/4 meropenem  >>  Dose adjustments this admission: N/A  Microbiology results: 12/30 resp panel: neg 1/2 MRSA PCR: collected but never received by lab 1/5: MRSA PCR: pending collection   Thank you for allowing pharmacy to be a part of this patients care.  Lacinda Moats, PharmD Clinical Pharmacist  1/5/20267:46 AM    [1]  Allergies Allergen Reactions   Sorbitol Other (See Comments)    GI Issues   Tomato Diarrhea   Zofran  Other (See Comments)    headache   Advil  [Ibuprofen ] Other (See Comments)    Irritates throat.   Cephalexin  Other (See Comments)     dizziness   Diphenhydramine  Hcl Other (See Comments)    nervous and upset does okay w/ cream   Morphine  And Codeine Other (See Comments)    Causes depression   Tramadol  Other (See Comments)    Dizziness   Antihistamines, Chlorpheniramine-Type Anxiety   Oxycodone  Anxiety    Confusion with inability to think clearly

## 2024-11-16 NOTE — Progress Notes (Signed)
" °   11/16/24 0142  BiPAP/CPAP/SIPAP  $ Non-Invasive Ventilator  Non-Invasive Vent Subsequent  BiPAP/CPAP/SIPAP Pt Type Adult  BiPAP/CPAP/SIPAP SERVO  Mask Type Full face mask  Mask Size Small  Set Rate 12 breaths/min  Respiratory Rate 15 breaths/min  IPAP 10 cmH20  EPAP 5 cmH2O  Pressure Support 5 cmH20  PEEP 5 cmH20  FiO2 (%) 60 %  Minute Ventilation 17.5  Leak 32  Peak Inspiratory Pressure (PIP) 13  Tidal Volume (Vt) 488  Patient Home Machine No  Patient Home Mask No  Patient Home Tubing No  Auto Titrate No  Press High Alarm 30 cmH2O  Device Plugged into RED Power Outlet Yes  BiPAP/CPAP /SiPAP Vitals  Pulse Rate 75  Resp (!) 24  SpO2 98 %  Bilateral Breath Sounds Clear;Diminished  MEWS Score/Color  MEWS Score 2  MEWS Score Color Yellow    "

## 2024-11-16 NOTE — Progress Notes (Signed)
 Patient is not currently able to do the flutter valve. Patient  is [facility] maxed out on HHFNC and a NRB.

## 2024-11-16 NOTE — Progress Notes (Signed)
" °   11/16/24 0142  BiPAP/CPAP/SIPAP  $ Non-Invasive Ventilator  Non-Invasive Vent Subsequent  BiPAP/CPAP/SIPAP Pt Type Adult  BiPAP/CPAP/SIPAP SERVO  Mask Type Full face mask  Mask Size Small  Set Rate 12 breaths/min  Respiratory Rate 15 breaths/min  IPAP 10 cmH20  EPAP 5 cmH2O  Pressure Support 5 cmH20  PEEP 5 cmH20  FiO2 (%) 60 %  Minute Ventilation 14.5  Leak 39  Peak Inspiratory Pressure (PIP) 10  Tidal Volume (Vt) 592  Patient Home Machine No  Patient Home Mask No  Patient Home Tubing No  Auto Titrate No  Press High Alarm 30 cmH2O  Device Plugged into RED Power Outlet Yes  BiPAP/CPAP /SiPAP Vitals  Pulse Rate 75  Resp (!) 24  SpO2 98 %  Bilateral Breath Sounds Clear;Diminished  MEWS Score/Color  MEWS Score 2  MEWS Score Color Yellow    "

## 2024-11-16 NOTE — Progress Notes (Signed)
" °   11/15/24 2208  BiPAP/CPAP/SIPAP  BiPAP/CPAP/SIPAP Pt Type Adult  BiPAP/CPAP/SIPAP SERVO (air)  Mask Type Full face mask  Dentures removed? Yes - Given to RN  Mask Size Small  Set Rate 12 breaths/min  Respiratory Rate 29 breaths/min  IPAP 10 cmH20  EPAP 5 cmH2O  Pressure Support 5 cmH20  PEEP 5 cmH20  FiO2 (%) 75 %  Minute Ventilation 17.8  Leak 38  Peak Inspiratory Pressure (PIP) 13  Tidal Volume (Vt) 573  Patient Home Machine No  Patient Home Mask No  Patient Home Tubing No  Auto Titrate No  Press High Alarm 30 cmH2O  Device Plugged into RED Power Outlet Yes  BiPAP/CPAP /SiPAP Vitals  Pulse Rate 91  Resp (!) 29  SpO2 99 %  Bilateral Breath Sounds Clear;Diminished (coarse)  MEWS Score/Color  MEWS Score 3  MEWS Score Color Yellow    "

## 2024-11-17 DIAGNOSIS — E876 Hypokalemia: Secondary | ICD-10-CM

## 2024-11-17 DIAGNOSIS — M549 Dorsalgia, unspecified: Secondary | ICD-10-CM

## 2024-11-17 DIAGNOSIS — G9340 Encephalopathy, unspecified: Secondary | ICD-10-CM

## 2024-11-17 DIAGNOSIS — G479 Sleep disorder, unspecified: Secondary | ICD-10-CM

## 2024-11-17 DIAGNOSIS — J84114 Acute interstitial pneumonitis: Secondary | ICD-10-CM

## 2024-11-17 MED ORDER — LORAZEPAM 1 MG PO TABS
1.0000 mg | ORAL_TABLET | Freq: Three times a day (TID) | ORAL | Status: DC | PRN
  Administered 2024-11-17 – 2024-11-20 (×3): 1 mg via ORAL
  Filled 2024-11-17 (×4): qty 1

## 2024-11-17 MED ORDER — ACETAMINOPHEN 160 MG/5ML PO SOLN
650.0000 mg | Freq: Four times a day (QID) | ORAL | Status: DC | PRN
  Administered 2024-11-17 – 2024-11-24 (×4): 650 mg via ORAL
  Filled 2024-11-17 (×4): qty 20.3

## 2024-11-17 MED ORDER — BUDESONIDE 0.5 MG/2ML IN SUSP
0.5000 mg | Freq: Two times a day (BID) | RESPIRATORY_TRACT | Status: DC
  Administered 2024-11-17 – 2024-12-01 (×28): 0.5 mg via RESPIRATORY_TRACT
  Filled 2024-11-17 (×28): qty 2

## 2024-11-17 MED ORDER — LABETALOL HCL 5 MG/ML IV SOLN
10.0000 mg | INTRAVENOUS | Status: DC | PRN
  Administered 2024-11-17 – 2024-11-18 (×5): 10 mg via INTRAVENOUS
  Filled 2024-11-17 (×5): qty 4

## 2024-11-17 MED ORDER — LIDOCAINE 5 % EX PTCH
1.0000 | MEDICATED_PATCH | CUTANEOUS | Status: DC
  Administered 2024-11-17 – 2024-12-01 (×13): 1 via TRANSDERMAL
  Filled 2024-11-17 (×13): qty 1

## 2024-11-17 NOTE — Progress Notes (Signed)
" °   11/17/24 0002  BiPAP/CPAP/SIPAP  $ Non-Invasive Ventilator  Non-Invasive Vent Subsequent  BiPAP/CPAP/SIPAP Pt Type Adult  BiPAP/CPAP/SIPAP SERVO  Mask Type Full face mask  Dentures removed? Yes - Placed in denture cup  Mask Size Small  Set Rate 12 breaths/min  Respiratory Rate 27 breaths/min  IPAP 10 cmH20  EPAP 5 cmH2O  Pressure Support 5 cmH20  PEEP 5 cmH20  FiO2 (%) 60 %  Minute Ventilation 19.6  Leak 25  Peak Inspiratory Pressure (PIP) 12  Tidal Volume (Vt) 684  Patient Home Machine No  Patient Home Mask No  Patient Home Tubing No  Auto Titrate No  Press High Alarm 30 cmH2O  CPAP/SIPAP surface wiped down Yes  Device Plugged into RED Power Outlet Yes  BiPAP/CPAP /SiPAP Vitals  Bilateral Breath Sounds Diminished;Clear    "

## 2024-11-17 NOTE — Progress Notes (Signed)
 "  NAME:  Anne [name], MRN:  [id number], DOB:  January 17, 1941, LOS: 6 ADMISSION DATE:  11/10/2024, CONSULTATION DATE:  11/15/23 REFERRING MD:  Stefano Horns, MD CHIEF COMPLAINT:  Worsening hypoxemia   History of Present Illness:  84 year old female recently seen in urgent cough 12/29 for cough however admitted on 12/31 for acute hypoxemic respiratory failure secondary to multifocal pneumonia. Covid, RSV and flu negative. CTA with infiltrates in RUL and LLL with background emphysema. ABG 7.52/36/78. WBC 13, improved to 11.8. Stable anemia 10.  This evening transferred to SDU due to worsening hypoxemia requiring HHFNC. CXR yesterday and today with increase interstitial prominence suggestive of pulmonary edema. IVF discontinued on 11/12/24. Lasix  given 40 mg x 3 in the last 24 hours. Already on empiric antibiotics with azithromycin  and ceftriaxone .  Overnight was placed on BiPAP however having difficulty maintaining mask. When mask is removed she has desaturations. Given 0.5 mg ativan  IV once. Family maintains full code status to TRH. PCCM consulted for airway watch and considering of PRN vs continuous IV sedation.  Of note she was previously followed at [facility] but lost to follow-up. Has tried Trelegy, Symbicort  and budesonide  but self discontinued inhalers due to hoarseness or ineffectiveness however only tried for short periods. Her dyspnea improved after starting iron therapy and doing well off inhalers per last note on 07/14/20.  Pertinent  Medical History  Metastatic breast cancer followed by [facility], s/p radiation, emphysema, dementia  Significant Hospital Events: Including procedures, antibiotic start and stop dates in addition to other pertinent events   12/31 Admitted 1/2 Transferred to SDU 1/3 PCCM consulted 1/3 echocardiogram > LVEF 60-65%, no regional wall motion abnormalities, grade 1 diastolic dysfunction, normal RV size and function.  PAP not estimated. 1/5 remains on 1.2  precedex  1/6 off precedex    Interim History / Subjective:  Off precedex  Back ache   Hypertensive overnight    Objective    Blood pressure (!) 166/85, pulse 91, temperature 98 F (36.7 C), temperature source Oral, resp. rate (!) 28, height 5' 5 (1.651 m), weight 59 kg, SpO2 96%.    FiO2 (%):  [60 %-100 %] 88 % PEEP:  [5 cmH20] 5 cmH20 Pressure Support:  [5 cmH20] 5 cmH20   Intake/Output Summary (Last 24 hours) at 11/17/2024 1026 Last data filed at 11/17/2024 0400 Gross per 24 hour  Intake 326.72 ml  Output 1550 ml  Net -1223.28 ml   Filed Weights   11/11/24 0749  Weight: 59 kg    Physical Exam: General: Chronically and critically ill elderly F  HENT: tacky mm temporal muscle wasting  Respiratory: even and unlabored. Diminished bases but clear anterior sounds  Cardiovascular: rr s1s2 cap refill < 3 sec  GI: soft thin  Extremities: no acute deformity  Neuro: Awake following commands  Psych: pleasant mood, congruent affect   Resolved problem list   Assessment and Plan    Acute encephalopathy - multifactorial, improving  ICU delirium Sleep disturbance Sepsis  Steroids  P -cont delirium precautions, fq reorientation -PT/OT is ordered -dc precedex   -adding home PRN BZD which pt takes for sleep   Acute hypoxic resp failure Multifocal PNA Metastatic dz Pneumonitis COPD  P -wean O2, goal > 88 -PRN BiPAP. Hopefully will be able to lean on this less at night -cont steroids  -MRSA PCR neg, dc vanc. Cont mero. If she declines consider antifungals    -triple therapy, pulm hygiene   Backache -added lido patch -changing APAP to liquid per  pt request  -PT has been ordered, think moving would be helpful   Labs   CBC: Recent Labs  Lab 11/12/24 0704 11/12/24 2201 11/13/24 1012 11/14/24 0255 11/15/24 0711 11/16/24 0708  WBC 13.4*  --  14.6* 11.8* 13.2* 15.1*  HGB 10.2* 10.6* 10.0* 10.1* 9.9* 10.4*  HCT 31.1* 32.2* 30.8* 30.0* 29.7* 30.8*  MCV 93.4  --   91.9 91.7 90.5 91.1  PLT 241  --  250 256 269 292    Basic Metabolic Panel: Recent Labs  Lab 11/12/24 0704 11/13/24 1012 11/14/24 0255 11/15/24 0711 11/15/24 1815 11/16/24 0708  NA 136 137 135 137  --  139  K 3.9 3.6 3.1* 3.3* 3.8 3.7  CL 101 102 95* 96*  --  100  CO2 24 23 27 26   --  25  GLUCOSE 105* 156* 113* 114*  --  142*  BUN 10 10 13 22   --  32*  CREATININE 0.52 0.50 0.64 0.58  --  0.45  CALCIUM  8.7* 9.0 8.8* 8.2*  --  8.1*  MG  --   --   --  2.1  --  2.8*  PHOS  --   --   --  3.1  --   --    GFR: Estimated Creatinine Clearance: 48.8 mL/min (by C-G formula based on SCr of 0.45 mg/dL). Recent Labs  Lab 11/10/24 1830 11/12/24 0704 11/13/24 1012 11/14/24 0255 11/15/24 0711 11/16/24 0708  WBC  --    < > 14.6* 11.8* 13.2* 15.1*  LATICACIDVEN 1.4  --   --   --   --   --    < > = values in this interval not displayed.    Liver Function Tests: No results for input(s): AST, ALT, ALKPHOS, BILITOT, PROT, ALBUMIN in the last 168 hours. No results for input(s): LIPASE, AMYLASE in the last 168 hours. No results for input(s): AMMONIA in the last 168 hours.  ABG    Component Value Date/Time   PHART 7.5 (H) 11/15/2024 0455   PCO2ART 40 11/15/2024 0455   PO2ART 71 (L) 11/15/2024 0455   HCO3 31.2 (H) 11/15/2024 0455   O2SAT 97.8 11/15/2024 0455     Coagulation Profile: No results for input(s): INR, PROTIME in the last 168 hours.  Cardiac Enzymes: No results for input(s): CKTOTAL, CKMB, CKMBINDEX, TROPONINI in the last 168 hours.  HbA1C: Hgb A1c MFr Bld  Date/Time Value Ref Range Status  04/04/2023 04:13 PM 6.0 4.6 - 6.5 % Final    Comment:    Glycemic Control Guidelines for People with Diabetes:Non Diabetic:  <6%Goal of Therapy: <7%Additional Action Suggested:  >8%   08/29/2022 02:41 PM 6.0 4.6 - 6.5 % Final    Comment:    Glycemic Control Guidelines for People with Diabetes:Non Diabetic:  <6%Goal of Therapy: <7%Additional Action  Suggested:  >8%     CBG: Recent Labs  Lab 11/14/24 0541  GLUCAP 116*     High MDM   Ronnald Gave MSN, AGACNP-BC [facility]/Critical Care Medicine Amion for pager 11/17/2024, 10:26 AM      "

## 2024-11-17 NOTE — Progress Notes (Signed)
 PT Cancellation Note  Patient Details Name: Anne [name] MRN: [id number] DOB: May 10, 1941   Cancelled Treatment:    Reason Eval/Treat Not Completed: Other (comment)PT order received, will proceed with eval  when schedule allows, most likely 11/19/23.  Darice Potters PT Acute Rehabilitation Services Office [phone]    Potters Darice Norris 11/17/2024, 3:45 PM

## 2024-11-17 NOTE — Progress Notes (Signed)
 eLink Physician-Brief Progress Note Patient Name: Anne [name] DOB: 04/11/1941 MRN: [id number]   Date of Service  11/17/2024  HPI/Events of Note  Progressive hypoxemic respiratory failure EF 65%, preserved RV function Called about elevated BP, 180/93, progressively uptrending with mild tachycardia  eICU Interventions  Add labetalol  as needed     Intervention Category Intermediate Interventions: Hypertension - evaluation and management  Varun Jourdan 11/17/2024, 2:00 AM

## 2024-11-18 DIAGNOSIS — J188 Other pneumonia, unspecified organism: Secondary | ICD-10-CM | POA: Diagnosis not present

## 2024-11-18 DIAGNOSIS — G9340 Encephalopathy, unspecified: Secondary | ICD-10-CM | POA: Diagnosis not present

## 2024-11-18 DIAGNOSIS — J9601 Acute respiratory failure with hypoxia: Secondary | ICD-10-CM | POA: Diagnosis not present

## 2024-11-18 DIAGNOSIS — E876 Hypokalemia: Secondary | ICD-10-CM | POA: Diagnosis not present

## 2024-11-18 DIAGNOSIS — J810 Acute pulmonary edema: Secondary | ICD-10-CM | POA: Diagnosis not present

## 2024-11-18 DIAGNOSIS — J441 Chronic obstructive pulmonary disease with (acute) exacerbation: Secondary | ICD-10-CM | POA: Diagnosis not present

## 2024-11-18 LAB — CBC
HCT: 33.1 % — ABNORMAL LOW (ref 36.0–46.0)
Hemoglobin: 10.7 g/dL — ABNORMAL LOW (ref 12.0–15.0)
MCH: 30 pg (ref 26.0–34.0)
MCHC: 32.3 g/dL (ref 30.0–36.0)
MCV: 92.7 fL (ref 80.0–100.0)
Platelets: 430 K/uL — ABNORMAL HIGH (ref 150–400)
RBC: 3.57 MIL/uL — ABNORMAL LOW (ref 3.87–5.11)
RDW: 14.5 % (ref 11.5–15.5)
WBC: 17.5 K/uL — ABNORMAL HIGH (ref 4.0–10.5)
nRBC: 0 % (ref 0.0–0.2)

## 2024-11-18 LAB — BASIC METABOLIC PANEL WITH GFR
Anion gap: 11 (ref 5–15)
BUN: 35 mg/dL — ABNORMAL HIGH (ref 8–23)
CO2: 25 mmol/L (ref 22–32)
Calcium: 9.3 mg/dL (ref 8.9–10.3)
Chloride: 108 mmol/L (ref 98–111)
Creatinine, Ser: 0.4 mg/dL — ABNORMAL LOW (ref 0.44–1.00)
GFR, Estimated: >60 mL/min
Glucose, Bld: 119 mg/dL — ABNORMAL HIGH (ref 70–99)
Potassium: 3.8 mmol/L (ref 3.5–5.1)
Sodium: 145 mmol/L (ref 135–145)

## 2024-11-18 MED ORDER — THIAMINE MONONITRATE 100 MG PO TABS
100.0000 mg | ORAL_TABLET | Freq: Every day | ORAL | Status: DC
  Administered 2024-11-18 – 2024-11-28 (×9): 100 mg via ORAL
  Filled 2024-11-18 (×10): qty 1

## 2024-11-18 NOTE — Evaluation (Signed)
 Physical Therapy Evaluation Patient Details Name: Anne [name] MRN: [id number] DOB: [birth year] Today's Date: 11/18/2024  History of Present Illness  Charity Tessier 84 year old woman PMH metastatic breast cancer followed by [facility], with cough x 10 days, seen by urgent care 12/29 and prescribed doxycycline  and prednisone .  CT chest showed multifocal pneumonia, no PE.  Admitted for pneumonia, acute hypoxic respiratory failure.  Clinical Impression  Pt admitted with above diagnosis.  Pt currently with functional limitations due to the deficits listed below (see PT Problem List). Pt will benefit from acute skilled PT to increase their independence and safety with mobility to allow discharge.       Per MD, use NRB  and HHFNC  prior to mobility and maintain NRB  until after activity.  SPo2 on HHFNC 60L/70%  at 90% SPO2 in bed and talking, up to 95% with added NRB at 15 L.  Patient   supervision to sit up +2  mod support to step to recliner, NRB slipped off with SPo2  drop to 83%, placed immediately back in place and back to 90%. After positioned in recliner, 95% on both oxygen systems. RT into room and will re move NRB when indicated.  Patient has supportive family and hopes to progress to Dc home  with HHPT.    If plan is discharge home, recommend the following: A little help with walking and/or transfers;A little help with bathing/dressing/bathroom;Help with stairs or ramp for entrance;Assist for transportation;Assistance with cooking/housework   Can travel by private vehicle        Equipment Recommendations  (TBD)  Recommendations for Other Services       Functional Status Assessment Patient has had a recent decline in their functional status and demonstrates the ability to make significant improvements in function in a reasonable and predictable amount of time.     Precautions / Restrictions Precautions Precautions: Fall Precaution/Restrictions Comments: on HHFNC, used NRB/15 L in  addidtion for mobility Restrictions Weight Bearing Restrictions Per Provider Order: No      Mobility  Bed Mobility Overal bed mobility: Needs Assistance Bed Mobility: Supine to Sit     Supine to sit: Supervision     General bed mobility comments: assist with lines and tubes    Transfers Overall transfer level: Needs assistance Equipment used: 2 person hand held assist Transfers: Sit to/from Stand, Bed to chair/wheelchair/BSC Sit to Stand: Min assist   Step pivot transfers: +2 physical assistance, +2 safety/equipment, Mod assist       General transfer comment: HHA to stand ,  2 HHA to step to recliner, assist with lines, On NRB and HHFNC 60L, 70 %    Ambulation/Gait                  Stairs            Wheelchair Mobility     Tilt Bed    Modified Rankin (Stroke Patients Only)       Balance Overall balance assessment: Needs assistance, History of Falls Sitting-balance support: No upper extremity supported, Feet unsupported Sitting balance-Leahy Scale: Fair     Standing balance support: Bilateral upper extremity supported, During functional activity, Reliant on assistive device for balance Standing balance-Leahy Scale: Poor                               Pertinent Vitals/Pain Pain Assessment Pain Assessment: No/denies pain    Home Living Family/patient expects to be  discharged to:: Private residence Living Arrangements: Spouse/significant other;Children Available Help at Discharge: Family;Available 24 hours/day Type of Home: House Home Access: Stairs to enter   [employer] of Steps: 2-3   Home Layout: Two level;1/2 bath on main level Home Equipment: None      Prior Function Prior Level of Function : Independent/Modified Independent             Mobility Comments: reports descends steps on her bottom ADLs Comments: independnent     Extremity/Trunk Assessment        Lower Extremity Assessment Lower  Extremity Assessment: Generalized weakness    Cervical / Trunk Assessment Cervical / Trunk Assessment: Kyphotic  Communication   Communication Communication: No apparent difficulties    Cognition Arousal: Alert Behavior During Therapy: WFL for tasks assessed/performed   PT - Cognitive impairments: History of cognitive impairments                         Following commands: Intact       Cueing Cueing Techniques: Verbal cues     General Comments      Exercises     Assessment/Plan    PT Assessment Patient needs continued PT services  PT Problem List Decreased strength;Decreased activity tolerance;Decreased balance;Cardiopulmonary status limiting activity;Decreased mobility;Decreased safety awareness       PT Treatment Interventions DME instruction;Gait training;Functional mobility training;Therapeutic activities;Stair training    PT Goals (Current goals can be found in the Care Plan section)  Acute Rehab PT Goals Patient Stated Goal: go home PT Goal Formulation: With patient/family Time For Goal Achievement: 12/02/24 Potential to Achieve Goals: Good    Frequency Min 3X/week     Co-evaluation PT/OT/SLP Co-Evaluation/Treatment: Yes Reason for Co-Treatment: Complexity of the patient's impairments (multi-system involvement);For patient/therapist safety;To address functional/ADL transfers PT goals addressed during session: Mobility/safety with mobility OT goals addressed during session: ADL's and self-care       AM-PAC PT 6 Clicks Mobility  Outcome Measure Help needed turning from your back to your side while in a flat bed without using bedrails?: None Help needed moving from lying on your back to sitting on the side of a flat bed without using bedrails?: None Help needed moving to and from a bed to a chair (including a wheelchair)?: A Lot Help needed standing up from a chair using your arms (e.g., wheelchair or bedside chair)?: A Lot Help needed to  walk in hospital room?: Total Help needed climbing 3-5 steps with a railing? : Total 6 Click Score: 14    End of Session   Activity Tolerance: Treatment limited secondary to medical complications (Comment);Patient tolerated treatment well Patient left: in chair;with call bell/phone within reach;with chair alarm set;with family/visitor present Nurse Communication: Mobility status PT Visit Diagnosis: Unsteadiness on feet (R26.81);Difficulty in walking, not elsewhere classified (R26.2)    Time: 1339-1400 PT Time Calculation (min) (ACUTE ONLY): 21 min   Charges:   PT Evaluation $PT Eval Low Complexity: 1 Low   PT General Charges $$ ACUTE PT VISIT: 1 Visit         Darice Potters PT Acute Rehabilitation Services Office [phone]   Potters Darice Norris 11/18/2024, 2:34 PM

## 2024-11-18 NOTE — Progress Notes (Signed)
 Physical Therapy Treatment Patient Details Name: Anne [name] MRN: [id number] DOB: 07-13-1941 Today's Date: 11/18/2024   History of Present Illness Anne [name] 84 year old woman PMH metastatic breast cancer followed by [facility], with cough x 10 days, seen by urgent care 12/29 and prescribed doxycycline  and prednisone .  CT chest showed multifocal pneumonia, no PE.  Admitted for pneumonia, acute hypoxic respiratory failure.    PT Comments  Patient and daughter instructed in theraband U/LE exercises while seated in recliner. Zpatient resting on HHF and NRB  as she had recently transferred to and from [facility]. Patient able to perform exercises/teach back.   Continue PT for mobility and exercises .    If plan is discharge home, recommend the following: A little help with walking and/or transfers;A little help with bathing/dressing/bathroom;Help with stairs or ramp for entrance;Assist for transportation;Assistance with cooking/housework   Can travel by private vehicle        Equipment Recommendations   (TBD)    Recommendations for Other Services       Precautions / Restrictions Precautions Precautions: Fall Precaution/Restrictions Comments: on HHFNC, used NRB in addidtion for mobility Restrictions Weight Bearing Restrictions Per Provider Order: No     Mobility  Bed Mobility     General bed mobility comments: in recliner    Transfers    Ambulation/Gait                   Stairs             Wheelchair Mobility     Tilt Bed    Modified Rankin (Stroke Patients Only)       Balance Overall balance assessment: Needs assistance, History of Falls Sitting-balance support: No upper extremity supported, Feet unsupported Sitting balance-Leahy Scale: Fair     Standing balance support: Bilateral upper extremity supported, During functional activity, Reliant on assistive device for balance Standing balance-Leahy Scale: Poor                               Communication Communication Communication: No apparent difficulties  Cognition Arousal: Alert Behavior During Therapy: WFL for tasks assessed/performed   PT - Cognitive impairments: History of cognitive impairments                       PT - Cognition Comments: required several demonstrations to perform exercises Following commands: Intact      Cueing Cueing Techniques: Verbal cues  Exercises General Exercises - Upper Extremity Shoulder Flexion: AROM, Both, 5 reps General Exercises - Lower Extremity Ankle Circles/Pumps: AROM, Both, 10 reps Short Arc Quad: AROM, Both, 10 reps Heel Slides: AROM, Both, 10 reps Hip ABduction/ADduction: AROM, 10 reps, Both Other Exercises Other Exercises: yellow TB for elb flex, shoulder extension,  x 10    General Comments        Pertinent Vitals/Pain Pain Assessment Pain Assessment: No/denies pain    Home Living Family/patient expects to be discharged to:: Private residence Living Arrangements: Spouse/significant other;Children Available Help at Discharge: Family;Available 24 hours/day Type of Home: House Home Access: Stairs to enter   [employer] of Steps: 2-3   Home Layout: Two level;1/2 bath on main level Home Equipment: None      Prior Function            PT Goals (current goals can now be found in the care plan section) Acute Rehab PT Goals Patient Stated Goal: go home PT Goal Formulation:  With patient/family Time For Goal Achievement: 12/02/24 Potential to Achieve Goals: Good Progress towards PT goals: Progressing toward goals    Frequency    Min 3X/week      PT Plan      Co-evaluation PT/OT/SLP Co-Evaluation/Treatment: Yes Reason for Co-Treatment: Complexity of the patient's impairments (multi-system involvement);For patient/therapist safety;To address functional/ADL transfers PT goals addressed during session: Mobility/safety with mobility OT goals addressed during session: ADL's and  self-care      AM-PAC PT 6 Clicks Mobility   Outcome Measure  Help needed turning from your back to your side while in a flat bed without using bedrails?: None Help needed moving from lying on your back to sitting on the side of a flat bed without using bedrails?: None Help needed moving to and from a bed to a chair (including a wheelchair)?: A Lot Help needed standing up from a chair using your arms (e.g., wheelchair or bedside chair)?: A Lot Help needed to walk in hospital room?: Total Help needed climbing 3-5 steps with a railing? : Total 6 Click Score: 14    End of Session   Activity Tolerance: Patient tolerated treatment well Patient left: in chair;with call bell/phone within reach;with chair alarm set;with family/visitor present Nurse Communication: Mobility status PT Visit Diagnosis: Unsteadiness on feet (R26.81);Difficulty in walking, not elsewhere classified (R26.2)     Time: 8469-8454 PT Time Calculation (min) (ACUTE ONLY): 15 min  Charges:    $Therapeutic Exercise: 8-22 mins PT General Charges $$ ACUTE PT VISIT: 1 Visit                     Darice Potters PT Acute Rehabilitation Services Office [phone]    Potters Darice Norris 11/18/2024, 3:48 PM

## 2024-11-18 NOTE — Progress Notes (Signed)
 "  NAME:  Anne [name], MRN:  [id number], DOB:  [birth year], LOS: 7 ADMISSION DATE:  11/10/2024, CONSULTATION DATE:  11/15/23 REFERRING MD:  Stefano Horns, MD CHIEF COMPLAINT:  Worsening hypoxemia   History of Present Illness:  84 year old female recently seen in urgent cough 12/29 for cough however admitted on 12/31 for acute hypoxemic respiratory failure secondary to multifocal pneumonia. Covid, RSV and flu negative. CTA with infiltrates in RUL and LLL with background emphysema. ABG 7.52/36/78. WBC 13, improved to 11.8. Stable anemia 10.  This evening transferred to SDU due to worsening hypoxemia requiring HHFNC. CXR yesterday and today with increase interstitial prominence suggestive of pulmonary edema. IVF discontinued on 11/12/24. Lasix  given 40 mg x 3 in the last 24 hours. Already on empiric antibiotics with azithromycin  and ceftriaxone .  Overnight was placed on BiPAP however having difficulty maintaining mask. When mask is removed she has desaturations. Given 0.5 mg ativan  IV once. Family maintains full code status to TRH. PCCM consulted for airway watch and considering of PRN vs continuous IV sedation.  Of note she was previously followed at [facility] but lost to follow-up. Has tried Trelegy, Symbicort  and budesonide  but self discontinued inhalers due to hoarseness or ineffectiveness however only tried for short periods. Her dyspnea improved after starting iron therapy and doing well off inhalers per last note on 07/14/20.  Pertinent  Medical History  Metastatic breast cancer followed by [facility], s/p radiation, emphysema, dementia  Significant Hospital Events: Including procedures, antibiotic start and stop dates in addition to other pertinent events   12/31 Admitted 1/2 Transferred to SDU 1/3 PCCM consulted 1/3 echocardiogram > LVEF 60-65%, no regional wall motion abnormalities, grade 1 diastolic dysfunction, normal RV size and function.  PAP not estimated. 1/5 remains on 1.2  precedex  1/6 off precedex    Interim History / Subjective:   Resting on bipap fio2 65% sats stable   Objective    Blood pressure (!) 148/66, pulse 89, temperature 98.5 F (36.9 C), temperature source Axillary, resp. rate (!) 26, height 5' 5 (1.651 m), weight 59 kg, SpO2 94%.    FiO2 (%):  [60 %-88 %] 65 % PEEP:  [6 cmH20] 6 cmH20 Pressure Support:  [5 cmH20] 5 cmH20   Intake/Output Summary (Last 24 hours) at 11/18/2024 0744 Last data filed at 11/18/2024 0400 Gross per 24 hour  Intake 100 ml  Output 1650 ml  Net -1550 ml   Filed Weights   11/11/24 0749  Weight: 59 kg    Physical Exam: General:  ill appearing female on bipap HEENT: MM pink/moist; bipap in place Neuro: Alert following commands CV: s1s2, RRR , no m/r/g PULM:  dim clear BS bilaterally; bipap 65% fio2 GI: soft, bsx4 active  Extremities: warm/dry, no edema  Skin: no rashes or lesions   Resolved problem list   Assessment and Plan    Acute encephalopathy - multifactorial, improving  ICU delirium Sleep disturbance Sepsis  Steroids  Plan: -limit sedating meds -delirium precautions -pt/ot -cont home prozac , elavil , ativan   Acute hypoxic resp failure Multifocal PNA Metastatic dz Pneumonitis COPD  Plan: -HHFNC during day; wean fio2 for sats >88% -bipap at bedtime and prn -cont steroids -cont meropenem  -triple therapy nebs -pulm toiletry  Backache Plan: -cont lido patch -pain management   1/7 son and husband updated at bedside  Labs   CBC: Recent Labs  Lab 11/13/24 1012 11/14/24 0255 11/15/24 0711 11/16/24 0708 11/18/24 0505  WBC 14.6* 11.8* 13.2* 15.1* 17.5*  HGB 10.0* 10.1*  9.9* 10.4* 10.7*  HCT 30.8* 30.0* 29.7* 30.8* 33.1*  MCV 91.9 91.7 90.5 91.1 92.7  PLT 250 256 269 292 430*    Basic Metabolic Panel: Recent Labs  Lab 11/13/24 1012 11/14/24 0255 11/15/24 0711 11/15/24 1815 11/16/24 0708 11/18/24 0505  NA 137 135 137  --  139 145  K 3.6 3.1* 3.3* 3.8 3.7 3.8  CL  102 95* 96*  --  100 108  CO2 23 27 26   --  25 25  GLUCOSE 156* 113* 114*  --  142* 119*  BUN 10 13 22   --  32* 35*  CREATININE 0.50 0.64 0.58  --  0.45 0.40*  CALCIUM  9.0 8.8* 8.2*  --  8.1* 9.3  MG  --   --  2.1  --  2.8*  --   PHOS  --   --  3.1  --   --   --    GFR: Estimated Creatinine Clearance: 48.8 mL/min (A) (by C-G formula based on SCr of 0.4 mg/dL (L)). Recent Labs  Lab 11/14/24 0255 11/15/24 0711 11/16/24 0708 11/18/24 0505  WBC 11.8* 13.2* 15.1* 17.5*    Liver Function Tests: No results for input(s): AST, ALT, ALKPHOS, BILITOT, PROT, ALBUMIN in the last 168 hours. No results for input(s): LIPASE, AMYLASE in the last 168 hours. No results for input(s): AMMONIA in the last 168 hours.  ABG    Component Value Date/Time   PHART 7.5 (H) 11/15/2024 0455   PCO2ART 40 11/15/2024 0455   PO2ART 71 (L) 11/15/2024 0455   HCO3 31.2 (H) 11/15/2024 0455   O2SAT 97.8 11/15/2024 0455     Coagulation Profile: No results for input(s): INR, PROTIME in the last 168 hours.  Cardiac Enzymes: No results for input(s): CKTOTAL, CKMB, CKMBINDEX, TROPONINI in the last 168 hours.  HbA1C: Hgb A1c MFr Bld  Date/Time Value Ref Range Status  04/04/2023 04:13 PM 6.0 4.6 - 6.5 % Final    Comment:    Glycemic Control Guidelines for People with Diabetes:Non Diabetic:  <6%Goal of Therapy: <7%Additional Action Suggested:  >8%   08/29/2022 02:41 PM 6.0 4.6 - 6.5 % Final    Comment:    Glycemic Control Guidelines for People with Diabetes:Non Diabetic:  <6%Goal of Therapy: <7%Additional Action Suggested:  >8%     CBG: Recent Labs  Lab 11/14/24 0541  GLUCAP 116*     Critical Care time: NA   JD Emilio RIGGERS [facility] 11/18/2024, 9:24 AM  Please see [email or web address] for pager details.  From 7A-7P if no response, please call [phone]. After hours, please call ELink [phone].       "

## 2024-11-18 NOTE — Plan of Care (Signed)
" °  Problem: Clinical Measurements: Goal: Cardiovascular complication will be avoided Outcome: Progressing   Problem: Nutrition: Goal: Adequate nutrition will be maintained Outcome: Progressing   Problem: Elimination: Goal: Will not experience complications related to bowel motility Outcome: Progressing   Problem: Clinical Measurements: Goal: Respiratory complications will improve Outcome: Not Progressing   Problem: Coping: Goal: Level of anxiety will decrease Outcome: Not Progressing   Problem: Pain Managment: Goal: General experience of comfort will improve and/or be controlled Outcome: Not Progressing   "

## 2024-11-18 NOTE — Evaluation (Signed)
 Occupational Therapy Evaluation Patient Details Name: Anne [name] MRN: [id number] DOB: 24-Mar-1941 Today's Date: 11/18/2024   History of Present Illness   Anne [name] 84 year old woman with PMH metastatic breast cancer followed by [facility], with cough x 10 days, seen by urgent care 12/29 and prescribed doxycycline  and prednisone .  CT chest showed multifocal pneumonia, no PE.  Admitted for pneumonia, acute hypoxic respiratory failure.     Clinical Impressions The pt is currently presenting below her baseline level of functioning for self-care management. She is limited by the below listed deficits (see OT problem list). During the session today, she required CGA for donning her socks seated EOB, min assist to stand, and mod assist x2 to step-pivot to the bedside chair. She is currently requiring increased supplemental O2 via HFNC in conjunction with a non-re breather mask during activity. Her O2 saturation briefly dropped to 83% with activity, however quickly recovered. She reported feelings of generalized weakness and she was also  noted to be with deconditioning. She will benefit from OT services to maximize her independence with ADLs and to decrease e the risk for further weakness and deconditioning. Home health OT is recommended.      If plan is discharge home, recommend the following:   Assistance with cooking/housework;Help with stairs or ramp for entrance;Assist for transportation;A lot of help with walking and/or transfers;A lot of help with bathing/dressing/bathroom     Functional Status Assessment   Patient has had a recent decline in their functional status and demonstrates the ability to make significant improvements in function in a reasonable and predictable amount of time.     Equipment Recommendations   Tub/shower bench     Recommendations for Other Services         Precautions/Restrictions   Precautions Precautions: Fall Restrictions Weight Bearing  Restrictions Per Provider Order: No Other Position/Activity Restrictions: on high flow nasal cannula, used non-rebreather in addition for mobility     Mobility Bed Mobility Overal bed mobility: Needs Assistance Bed Mobility: Supine to Sit     Supine to sit: Supervision          Transfers Overall transfer level: Needs assistance Equipment used: 2 person hand held assist Transfers: Sit to/from Stand, Bed to chair/wheelchair/BSC Sit to Stand: Min assist     Step pivot transfers: +2 physical assistance, +2 safety/equipment, Mod assist            Balance       Sitting balance - Comments: static sitting-good. dynamic sitting-fair+     Standing balance-Leahy Scale: Poor              ADL either performed or assessed with clinical judgement   ADL Overall ADL's : Needs assistance/impaired Eating/Feeding: Independent;Sitting   Grooming: Set up;Supervision/safety;Sitting   Upper Body Bathing: Set up;Supervision/ safety;Sitting   Lower Body Bathing: Sitting/lateral leans;Minimal assistance   Upper Body Dressing : Set up;Sitting   Lower Body Dressing: Sitting/lateral leans;Contact guard assist Lower Body Dressing Details (indicate cue type and reason): She donned her socks seated EOB. Toilet Transfer: Moderate assistance;BSC/3in1;Stand-pivot;Rolling walker (2 wheels)   Toileting- Clothing Manipulation and Hygiene: Moderate assistance;Sit to/from stand;Cueing for compensatory techniques Toileting - Clothing Manipulation Details (indicate cue type and reason): at bedside commode level, based on clinical judgement             Vision   Additional Comments: She correctly read the time depicted on the wall clock.            Pertinent Vitals/Pain  Pain Assessment Pain Assessment: No/denies pain     Extremity/Trunk Assessment Upper Extremity Assessment Upper Extremity Assessment: LUE deficits/detail;RUE deficits/detail RUE Deficits / Details: AROM WFL. Gross  strength 4/5 LUE Deficits / Details: AROM WFL. Gross strength 4/5   Lower Extremity Assessment Lower Extremity Assessment: Generalized weakness;RLE deficits/detail;LLE deficits/detail RLE Deficits / Details: AROM WFL LLE Deficits / Details: AROM WFL   Cervical / Trunk Assessment Cervical / Trunk Assessment: Kyphotic   Communication Communication Communication: No apparent difficulties   Cognition Arousal: Alert Behavior During Therapy: WFL for tasks assessed/performed               OT - Cognition Comments: Oriented x4            Cueing  General Comments   Cueing Techniques: Verbal cues              Home Living Family/patient expects to be discharged to:: Private residence Living Arrangements: Spouse/significant other Available Help at Discharge: Family Type of Home: House Home Access: Stairs to enter Secretary/administrator of Steps: 2-3 Entrance Stairs-Rails: Right Home Layout: Two level;1/2 bath on main level Alternate Level Stairs-Number of Steps: all bedrooms and full bathroom is upstairs             Home Equipment: None          Prior Functioning/Environment Prior Level of Function : Independent/Modified Independent             Mobility Comments:  (Independent with ambulation.) ADLs Comments:  (Independent with ADLs and sharing household chores with her spouse. She has not been driving recently. She enjoys gardening.)    OT Problem List: Decreased strength;Decreased activity tolerance;Impaired balance (sitting and/or standing);Decreased knowledge of use of DME or AE;Cardiopulmonary status limiting activity   OT Treatment/Interventions: Self-care/ADL training;Therapeutic exercise;Energy conservation;DME and/or AE instruction;Therapeutic activities;Balance training;Patient/family education      OT Goals(Current goals can be found in the care plan section)   Acute Rehab OT Goals Patient Stated Goal: to get stronger OT Goal  Formulation: With patient/family Time For Goal Achievement: 12/02/24 Potential to Achieve Goals: Good ADL Goals Pt Will Perform Grooming: with modified independence;standing Pt Will Perform Lower Body Dressing: with modified independence;sit to/from stand Pt Will Transfer to Toilet: with modified independence;ambulating Pt Will Perform Toileting - Clothing Manipulation and hygiene: with modified independence;sit to/from stand   OT Frequency:  Min 2X/week    Co-evaluation PT/OT/SLP Co-Evaluation/Treatment: Yes Reason for Co-Treatment: Complexity of the patient's impairments (multi-system involvement);For patient/therapist safety;To address functional/ADL transfers PT goals addressed during session: Mobility/safety with mobility OT goals addressed during session: ADL's and self-care      AM-PAC OT 6 Clicks Daily Activity     Outcome Measure Help from another person eating meals?: None Help from another person taking care of personal grooming?: A Little Help from another person toileting, which includes using toliet, bedpan, or urinal?: A Lot Help from another person bathing (including washing, rinsing, drying)?: A Lot Help from another person to put on and taking off regular upper body clothing?: A Little Help from another person to put on and taking off regular lower body clothing?: A Little 6 Click Score: 17   End of Session Equipment Utilized During Treatment: Gait belt;Oxygen Nurse Communication: Mobility status  Activity Tolerance: Other (comment) (fair tolerance) Patient left: in chair;with call bell/phone within reach;with family/visitor present  OT Visit Diagnosis: Unsteadiness on feet (R26.81);Other abnormalities of gait and mobility (R26.89);Muscle weakness (generalized) (M62.81)  Time: 1338-1400 OT Time Calculation (min): 22 min Charges:  OT General Charges $OT Visit: 1 Visit OT Evaluation $OT Eval Moderate Complexity: 1 Mod   Tikesha Mort J Harris,  OTR/L 11/18/2024, 4:18 PM

## 2024-11-18 NOTE — Progress Notes (Signed)
" °   11/18/24 0001  BiPAP/CPAP/SIPAP  BiPAP/CPAP/SIPAP Pt Type Adult  BiPAP/CPAP/SIPAP SERVO  Mask Type Full face mask  Dentures removed? No (Patient refused to allow removal of dentures)  Mask Size Small  Set Rate 12 breaths/min  Respiratory Rate 29 breaths/min  IPAP 11 cmH20  EPAP 5 cmH2O  Pressure Support 5 cmH20  PEEP 6 cmH20  FiO2 (%) 60 %  Minute Ventilation 16.3  Leak 19  Peak Inspiratory Pressure (PIP) 11  Tidal Volume (Vt) 550  Patient Home Machine No  Patient Home Mask No  Patient Home Tubing No  Auto Titrate No  Press High Alarm 25 cmH2O  Press Low Alarm 5 cmH2O  Device Plugged into RED Power Outlet Yes  BiPAP/CPAP /SiPAP Vitals  Pulse Rate 87  Resp (!) 29  BP (!) 165/86  SpO2 92 %  MEWS Score/Color  MEWS Score 2  MEWS Score Color Yellow    "

## 2024-11-19 ENCOUNTER — Inpatient Hospital Stay (HOSPITAL_COMMUNITY)

## 2024-11-19 DIAGNOSIS — J9601 Acute respiratory failure with hypoxia: Secondary | ICD-10-CM | POA: Diagnosis not present

## 2024-11-19 DIAGNOSIS — J810 Acute pulmonary edema: Secondary | ICD-10-CM | POA: Diagnosis not present

## 2024-11-19 DIAGNOSIS — J188 Other pneumonia, unspecified organism: Secondary | ICD-10-CM | POA: Diagnosis not present

## 2024-11-19 DIAGNOSIS — J441 Chronic obstructive pulmonary disease with (acute) exacerbation: Secondary | ICD-10-CM | POA: Diagnosis not present

## 2024-11-19 LAB — BASIC METABOLIC PANEL WITH GFR
Anion gap: 13 (ref 5–15)
BUN: 35 mg/dL — ABNORMAL HIGH (ref 8–23)
CO2: 24 mmol/L (ref 22–32)
Calcium: 9.7 mg/dL (ref 8.9–10.3)
Chloride: 103 mmol/L (ref 98–111)
Creatinine, Ser: 0.49 mg/dL (ref 0.44–1.00)
GFR, Estimated: >60 mL/min
Glucose, Bld: 101 mg/dL — ABNORMAL HIGH (ref 70–99)
Potassium: 4.3 mmol/L (ref 3.5–5.1)
Sodium: 140 mmol/L (ref 135–145)

## 2024-11-19 LAB — CBC
HCT: 36 % (ref 36.0–46.0)
Hemoglobin: 11.3 g/dL — ABNORMAL LOW (ref 12.0–15.0)
MCH: 29.3 pg (ref 26.0–34.0)
MCHC: 31.4 g/dL (ref 30.0–36.0)
MCV: 93.3 fL (ref 80.0–100.0)
Platelets: 401 K/uL — ABNORMAL HIGH (ref 150–400)
RBC: 3.86 MIL/uL — ABNORMAL LOW (ref 3.87–5.11)
RDW: 14.7 % (ref 11.5–15.5)
WBC: 20.5 K/uL — ABNORMAL HIGH (ref 4.0–10.5)
nRBC: 0 % (ref 0.0–0.2)

## 2024-11-19 LAB — GLUCOSE, CAPILLARY
Glucose-Capillary: 105 mg/dL — ABNORMAL HIGH (ref 70–99)
Glucose-Capillary: 109 mg/dL — ABNORMAL HIGH (ref 70–99)
Glucose-Capillary: 164 mg/dL — ABNORMAL HIGH (ref 70–99)
Glucose-Capillary: 144 mg/dL — ABNORMAL HIGH (ref 70–99)

## 2024-11-19 MED ORDER — REVEFENACIN 175 MCG/3ML IN SOLN
175.0000 ug | Freq: Every day | RESPIRATORY_TRACT | Status: DC
  Administered 2024-11-19 – 2024-12-01 (×13): 175 ug via RESPIRATORY_TRACT
  Filled 2024-11-19 (×14): qty 3

## 2024-11-19 MED ORDER — HYDROCHLOROTHIAZIDE 12.5 MG PO TABS: 12.5000 mg | ORAL_TABLET | Freq: Every day | ORAL | Status: DC

## 2024-11-19 MED ORDER — GUAIFENESIN-DM 100-10 MG/5ML PO SYRP
10.0000 mL | ORAL_SOLUTION | Freq: Three times a day (TID) | ORAL | Status: DC
  Administered 2024-11-19 – 2024-12-01 (×35): 10 mL via ORAL
  Filled 2024-11-19 (×35): qty 10

## 2024-11-19 NOTE — Progress Notes (Signed)
 "  NAME:  Anne [name], MRN:  [id number], DOB:  June 01, 1941, LOS: 8 ADMISSION DATE:  11/10/2024, CONSULTATION DATE:  11/15/23 REFERRING MD:  Stefano Horns, MD CHIEF COMPLAINT:  Worsening hypoxemia   History of Present Illness:  84 year old female recently seen in urgent cough 12/29 for cough however admitted on 12/31 for acute hypoxemic respiratory failure secondary to multifocal pneumonia. Covid, RSV and flu negative. CTA with infiltrates in RUL and LLL with background emphysema. ABG 7.52/36/78. WBC 13, improved to 11.8. Stable anemia 10.  This evening transferred to SDU due to worsening hypoxemia requiring HHFNC. CXR yesterday and today with increase interstitial prominence suggestive of pulmonary edema. IVF discontinued on 11/12/24. Lasix  given 40 mg x 3 in the last 24 hours. Already on empiric antibiotics with azithromycin  and ceftriaxone .  Overnight was placed on BiPAP however having difficulty maintaining mask. When mask is removed she has desaturations. Given 0.5 mg ativan  IV once. Family maintains full code status to TRH. PCCM consulted for airway watch and considering of PRN vs continuous IV sedation.  Of note she was previously followed at [facility] but lost to follow-up. Has tried Trelegy, Symbicort  and budesonide  but self discontinued inhalers due to hoarseness or ineffectiveness however only tried for short periods. Her dyspnea improved after starting iron therapy and doing well off inhalers per last note on 07/14/20.  Pertinent  Medical History  Metastatic breast cancer followed by [facility], s/p radiation, emphysema, dementia  Significant Hospital Events: Including procedures, antibiotic start and stop dates in addition to other pertinent events   12/31 Admitted 1/2 Transferred to SDU 1/3 PCCM consulted 1/3 echocardiogram > LVEF 60-65%, no regional wall motion abnormalities, grade 1 diastolic dysfunction, normal RV size and function.  PAP not estimated. 1/5 remains on 1.2  precedex  1/6 off precedex    Interim History / Subjective:   Yesterday was sitting up in chair. HHFNC during day and bipap at night. This am resting on bipap fio2 60% sats stable   Objective    Blood pressure (!) 126/48, pulse 98, temperature 97.6 F (36.4 C), temperature source Axillary, resp. rate (!) 28, height 5' 5 (1.651 m), weight 59 kg, SpO2 91%.    FiO2 (%):  [70 %-92 %] 92 % Pressure Support:  [5 cmH20] 5 cmH20   Intake/Output Summary (Last 24 hours) at 11/19/2024 0847 Last data filed at 11/19/2024 0600 Gross per 24 hour  Intake 200 ml  Output 1175 ml  Net -975 ml   Filed Weights   11/11/24 0749  Weight: 59 kg    Physical Exam: General:  ill appearing female on bipap HEENT: MM pink/moist; bipap in place Neuro: Alert following commands CV: s1s2, RRR , no m/r/g PULM:  dim clear BS bilaterally; bipap 60% fio2 GI: soft, bsx4 active  Extremities: warm/dry, no edema  Skin: no rashes or lesions   Resolved problem list   Assessment and Plan    Acute encephalopathy - multifactorial, improving  ICU delirium Sleep disturbance Sepsis  Steroids  Plan: -limit sedating meds -delirium precautions -pt/ot -cont home prozac , elavil , ativan   Acute hypoxic resp failure Multifocal PNA Metastatic dz Pneumonitis COPD  Plan: -HHFNC during day; wean fio2 for sats >88% -bipap at bedtime and prn -cont steroids -cont meropenem  -triple therapy nebs -pulm toiletry -repeat CXR today -pt/ot; oob as tolerated  Backache Plan: -cont lido patch -pain management  HTN Plan: -start on hydrochlorothiazide  -prn labetalol    1/8 daughter in law updated at bedside     Labs  CBC: Recent Labs  Lab 11/14/24 0255 11/15/24 0711 11/16/24 0708 11/18/24 0505 11/19/24 0326  WBC 11.8* 13.2* 15.1* 17.5* 20.5*  HGB 10.1* 9.9* 10.4* 10.7* 11.3*  HCT 30.0* 29.7* 30.8* 33.1* 36.0  MCV 91.7 90.5 91.1 92.7 93.3  PLT 256 269 292 430* 401*    Basic Metabolic Panel: Recent  Labs  Lab 11/14/24 0255 11/15/24 0711 11/15/24 1815 11/16/24 0708 11/18/24 0505 11/19/24 0326  NA 135 137  --  139 145 140  K 3.1* 3.3* 3.8 3.7 3.8 4.3  CL 95* 96*  --  100 108 103  CO2 27 26  --  25 25 24   GLUCOSE 113* 114*  --  142* 119* 101*  BUN 13 22  --  32* 35* 35*  CREATININE 0.64 0.58  --  0.45 0.40* 0.49  CALCIUM  8.8* 8.2*  --  8.1* 9.3 9.7  MG  --  2.1  --  2.8*  --   --   PHOS  --  3.1  --   --   --   --    GFR: Estimated Creatinine Clearance: 48.8 mL/min (by C-G formula based on SCr of 0.49 mg/dL). Recent Labs  Lab 11/15/24 0711 11/16/24 0708 11/18/24 0505 11/19/24 0326  WBC 13.2* 15.1* 17.5* 20.5*    Liver Function Tests: No results for input(s): AST, ALT, ALKPHOS, BILITOT, PROT, ALBUMIN in the last 168 hours. No results for input(s): LIPASE, AMYLASE in the last 168 hours. No results for input(s): AMMONIA in the last 168 hours.  ABG    Component Value Date/Time   PHART 7.5 (H) 11/15/2024 0455   PCO2ART 40 11/15/2024 0455   PO2ART 71 (L) 11/15/2024 0455   HCO3 31.2 (H) 11/15/2024 0455   O2SAT 97.8 11/15/2024 0455     Coagulation Profile: No results for input(s): INR, PROTIME in the last 168 hours.  Cardiac Enzymes: No results for input(s): CKTOTAL, CKMB, CKMBINDEX, TROPONINI in the last 168 hours.  HbA1C: Hgb A1c MFr Bld  Date/Time Value Ref Range Status  04/04/2023 04:13 PM 6.0 4.6 - 6.5 % Final    Comment:    Glycemic Control Guidelines for People with Diabetes:Non Diabetic:  <6%Goal of Therapy: <7%Additional Action Suggested:  >8%   08/29/2022 02:41 PM 6.0 4.6 - 6.5 % Final    Comment:    Glycemic Control Guidelines for People with Diabetes:Non Diabetic:  <6%Goal of Therapy: <7%Additional Action Suggested:  >8%     CBG: Recent Labs  Lab 11/14/24 0541 11/19/24 0754  GLUCAP 116* 105*     Critical Care time: NA   [name] [name] [name] [facility] 11/19/2024, 8:47 AM  Please see  [email or web address] for pager details.  From 7A-7P if no response, please call [phone]. After hours, please call ELink [phone].       "

## 2024-11-19 NOTE — Progress Notes (Signed)
" °   11/18/24 2300  BiPAP/CPAP/SIPAP  $ Face Mask Small Yes  BiPAP/CPAP/SIPAP Pt Type Adult  BiPAP/CPAP/SIPAP SERVO  Mask Type Full face mask  Dentures removed? Not applicable  Mask Size Small  Set Rate 12 breaths/min  Respiratory Rate 28 breaths/min  IPAP 10 cmH20  EPAP 5 cmH2O  Pressure Support 5 cmH20  FiO2 (%) 91 %  Minute Ventilation 20.1  Leak 25  Peak Inspiratory Pressure (PIP) 15  Tidal Volume (Vt) 525  Patient Home Machine No  Patient Home Mask No  Patient Home Tubing No  Auto Titrate No  Press High Alarm 25 cmH2O  Press Low Alarm 5 cmH2O  CPAP/SIPAP surface wiped down Yes  Device Plugged into RED Power Outlet Yes  BiPAP/CPAP /SiPAP Vitals  Pulse Rate 91  Resp (!) 25  BP 137/63  SpO2 95 %  MEWS Score/Color  MEWS Score 1  MEWS Score Color Green    "

## 2024-11-19 NOTE — Plan of Care (Signed)
" °  Problem: Education: Goal: Knowledge of General Education information will improve Description: Including pain rating scale, medication(s)/side effects and non-pharmacologic comfort measures Outcome: Progressing   Problem: Clinical Measurements: Goal: Respiratory complications will improve Outcome: Progressing Goal: Cardiovascular complication will be avoided Outcome: Progressing   Problem: Activity: Goal: Risk for activity intolerance will decrease Outcome: Progressing   Problem: Nutrition: Goal: Adequate nutrition will be maintained Outcome: Progressing   Problem: Pain Managment: Goal: General experience of comfort will improve and/or be controlled Outcome: Progressing   Problem: Respiratory: Goal: Ability to maintain adequate ventilation will improve Outcome: Not Progressing Goal: Ability to maintain a clear airway will improve Outcome: Not Progressing   "

## 2024-11-19 NOTE — Progress Notes (Signed)
 PT woke up alert and ready to come off BiPAP (no respiratory distress noted). PT is now on heated high flow nasal cannula system (50L & 85% with Sp02 90%- goal >88%). RN aware. CPT completed- see documentation.

## 2024-11-19 NOTE — Progress Notes (Signed)
 PT remains on BiPAP at this time- no CPT completed.

## 2024-11-19 NOTE — Progress Notes (Signed)
 PT on BiPAP 1st rounds- not CPT completed.

## 2024-11-19 NOTE — Progress Notes (Signed)
" °   11/19/24 2340  BiPAP/CPAP/SIPAP  BiPAP/CPAP/SIPAP Pt Type Adult  BiPAP/CPAP/SIPAP SERVO (air)  Mask Type Full face mask  Dentures removed? Not applicable  Mask Size Small  Set Rate 15 breaths/min (BUR)  Respiratory Rate 32 breaths/min  IPAP 12 cmH20  EPAP 8 cmH2O  Pressure Support 4 cmH20 (above PEEP 8)  PEEP 8 cmH20  FiO2 (%) 70 %  Minute Ventilation 16.1  Leak 36  Peak Inspiratory Pressure (PIP) 12  Tidal Volume (Vt) 619  Patient Home Machine No  Patient Home Mask No  Patient Home Tubing No  Auto Titrate No  Press High Alarm 25 cmH2O  BiPAP/CPAP /SiPAP Vitals  Bilateral Breath Sounds Clear;Diminished (Right Upper lobe more diminished than earlier in shift)    "

## 2024-11-20 ENCOUNTER — Inpatient Hospital Stay (HOSPITAL_COMMUNITY)

## 2024-11-20 DIAGNOSIS — C799 Secondary malignant neoplasm of unspecified site: Secondary | ICD-10-CM

## 2024-11-20 LAB — GLUCOSE, CAPILLARY
Glucose-Capillary: 106 mg/dL — ABNORMAL HIGH (ref 70–99)
Glucose-Capillary: 118 mg/dL — ABNORMAL HIGH (ref 70–99)
Glucose-Capillary: 157 mg/dL — ABNORMAL HIGH (ref 70–99)
Glucose-Capillary: 139 mg/dL — ABNORMAL HIGH (ref 70–99)

## 2024-11-20 LAB — BLOOD GAS, ARTERIAL
Acid-Base Excess: 7.9 mmol/L — ABNORMAL HIGH (ref 0.0–2.0)
Bicarbonate: 30.4 mmol/L — ABNORMAL HIGH (ref 20.0–28.0)
Drawn by: 23532
Expiratory PAP: 10 cmH2O
FIO2: 70 %
Inspiratory PAP: 15 cmH2O
Mode: POSITIVE
O2 Saturation: 97.5 %
Patient temperature: 37
pCO2 arterial: 34 mmHg (ref 32–48)
pH, Arterial: 7.56 — ABNORMAL HIGH (ref 7.35–7.45)
pO2, Arterial: 77 mmHg — ABNORMAL LOW (ref 83–108)

## 2024-11-20 LAB — CBC
HCT: 32.7 % — ABNORMAL LOW (ref 36.0–46.0)
Hemoglobin: 10.5 g/dL — ABNORMAL LOW (ref 12.0–15.0)
MCH: 29.3 pg (ref 26.0–34.0)
MCHC: 32.1 g/dL (ref 30.0–36.0)
MCV: 91.3 fL (ref 80.0–100.0)
Platelets: 418 K/uL — ABNORMAL HIGH (ref 150–400)
RBC: 3.58 MIL/uL — ABNORMAL LOW (ref 3.87–5.11)
RDW: 14.8 % (ref 11.5–15.5)
WBC: 21.9 K/uL — ABNORMAL HIGH (ref 4.0–10.5)
nRBC: 0 % (ref 0.0–0.2)

## 2024-11-20 LAB — BASIC METABOLIC PANEL WITH GFR
Anion gap: 11 (ref 5–15)
BUN: 33 mg/dL — ABNORMAL HIGH (ref 8–23)
CO2: 27 mmol/L (ref 22–32)
Calcium: 9.5 mg/dL (ref 8.9–10.3)
Chloride: 102 mmol/L (ref 98–111)
Creatinine, Ser: 0.42 mg/dL — ABNORMAL LOW (ref 0.44–1.00)
GFR, Estimated: >60 mL/min
Glucose, Bld: 103 mg/dL — ABNORMAL HIGH (ref 70–99)
Potassium: 4.1 mmol/L (ref 3.5–5.1)
Sodium: 140 mmol/L (ref 135–145)

## 2024-11-20 LAB — PRO BRAIN NATRIURETIC PEPTIDE: Pro Brain Natriuretic Peptide: 328 pg/mL — ABNORMAL HIGH

## 2024-11-20 MED ORDER — LORAZEPAM 1 MG PO TABS
1.0000 mg | ORAL_TABLET | Freq: Every evening | ORAL | Status: DC | PRN
  Administered 2024-11-20 – 2024-11-29 (×7): 1 mg via ORAL
  Filled 2024-11-20 (×7): qty 1

## 2024-11-20 MED ORDER — FUROSEMIDE 10 MG/ML IJ SOLN
40.0000 mg | Freq: Once | INTRAMUSCULAR | Status: AC
  Administered 2024-11-20: 40 mg via INTRAVENOUS
  Filled 2024-11-20: qty 4

## 2024-11-20 NOTE — Progress Notes (Addendum)
 eLink Physician-Brief Progress Note Patient Name: Anne [name] DOB: September 03, 1941 MRN: [id number]   Date of Service  11/20/2024  HPI/Events of Note  Patient with sub-optimal oxygenation on BIPAP. She is > - 2 liters fluid balance.  eICU Interventions  BIPAP settings changed to 15/10, ABG in one hour. CXR ordered as well.        Dakotah Heiman U Sadik Piascik 11/20/2024, 12:06 AM

## 2024-11-20 NOTE — Progress Notes (Signed)
 eLink Physician-Brief Progress Note Patient Name: Anne [name] DOB: April 18, 1941 MRN: [id number]   Date of Service  11/20/2024  HPI/Events of Note  CXR reviwed.  eICU Interventions  Stat BNP ordered.        Anne [name] 11/20/2024, 1:22 AM
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
## 2024-11-20 NOTE — Plan of Care (Signed)
" °  Problem: Education: Goal: Knowledge of General Education information will improve Description: Including pain rating scale, medication(s)/side effects and non-pharmacologic comfort measures Outcome: Progressing   Problem: Clinical Measurements: Goal: Respiratory complications will improve Outcome: Not Progressing Goal: Cardiovascular complication will be avoided Outcome: Progressing   Problem: Nutrition: Goal: Adequate nutrition will be maintained Outcome: Progressing   Problem: Elimination: Goal: Will not experience complications related to bowel motility Outcome: Progressing Goal: Will not experience complications related to urinary retention Outcome: Not Progressing   Problem: Safety: Goal: Ability to remain free from injury will improve Outcome: Progressing   "

## 2024-11-20 NOTE — Progress Notes (Signed)
 Physical Therapy Treatment Patient Details Name: Anne [name] MRN: [id number] DOB: [birth year] Today's Date: 11/20/2024   History of Present Illness Anne [name] 84 year old woman PMH metastatic breast cancer followed by [facility], with cough x 10 days, seen by urgent care 12/29 and prescribed doxycycline  and prednisone .  CT chest showed multifocal pneumonia, no PE.  Admitted for pneumonia, acute hypoxic respiratory failure.    PT Comments  Pt seen in [facility] RM# [id number] Pt in bed on 15 LTS 60% HHFNC AxO x 3 pleasant and willing.  Spouse at bedside. Pt was self able to transition to EOB at Supervison level.  Pt was able to static sit EOB X 8 min at Supervision.  Pt was able to brieflt stand at EOB but no more than 90 seconds as Pt statrted to c/o weakness/fatigue and sats decreased to 80% with 3/4 dyspnea.  Assisted back to bed and positioned to comfort. Supine      BP 112/59(76)  HR 100 15 lts 60% HHFNC sats 94% RR 25 EOB          BP 103/57(72) HR 105 15 lts 60% HHFNC sats 91% RR 20 Standing   BP  72/48(55)  HR 107  15 lts 60% HHFNC sats 80% RR 40 Back to bed  BP 115/55(75) HR 104    NRB 89% RR 28 (RN in room at that time)   Prior to admit, Pt was IND living home with Spouse.  LPT has rec HH PT.     If plan is discharge home, recommend the following: A little help with walking and/or transfers;A little help with bathing/dressing/bathroom;Help with stairs or ramp for entrance;Assist for transportation;Assistance with cooking/housework   Can travel by private vehicle        Equipment Recommendations       Recommendations for Other Services       Precautions / Restrictions Precautions Precautions: Fall Precaution/Restrictions Comments: on HHFNC, used NRB in addidtion for recovery Restrictions Weight Bearing Restrictions Per Provider Order: No     Mobility  Bed Mobility Overal bed mobility: Needs Assistance Bed Mobility: Supine to Sit, Sit to Supine     Supine to sit:  Supervision Sit to supine: Min assist   General bed mobility comments: Pt was self able to transition to EOB at Supervision using bed rail and HOB elevated.  Pt sat EOB x 8 min at Supervision.    Transfers Overall transfer level: Needs assistance Equipment used: 2 person hand held assist Transfers: Sit to/from Stand Sit to Stand: Min assist, +2 physical assistance, +2 safety/equipment           General transfer comment: Assisted with sit to stand + 2 HHA from EOB static standing x 90 seconds while monitoring vitals.  Max c/o weakness/fatigue.  SATS decreased to 80% with 3/4 dyspnea.  Assisted back to EOB then elevated HOB spine for recovery.  RN arrived and applied NRB with slow recovery to low 90's taking several minutes.    Ambulation/Gait                   Stairs             Wheelchair Mobility     Tilt Bed    Modified Rankin (Stroke Patients Only)       Balance  Communication Communication Communication: No apparent difficulties  Cognition Arousal: Alert Behavior During Therapy: WFL for tasks assessed/performed   PT - Cognitive impairments: History of cognitive impairments                       PT - Cognition Comments: AxO x 3 pleasant and motivated Following commands: Intact      Cueing Cueing Techniques: Verbal cues  Exercises      General Comments        Pertinent Vitals/Pain Pain Assessment Pain Assessment: No/denies pain    Home Living                          Prior Function            PT Goals (current goals can now be found in the care plan section) Progress towards PT goals: Progressing toward goals    Frequency    Min 3X/week      PT Plan      Co-evaluation              AM-PAC PT 6 Clicks Mobility   Outcome Measure  Help needed turning from your back to your side while in a flat bed without using bedrails?: None Help  needed moving from lying on your back to sitting on the side of a flat bed without using bedrails?: None Help needed moving to and from a bed to a chair (including a wheelchair)?: A Little Help needed standing up from a chair using your arms (e.g., wheelchair or bedside chair)?: A Little Help needed to walk in hospital room?: A Lot Help needed climbing 3-5 steps with a railing? : A Lot 6 Click Score: 18    End of Session Equipment Utilized During Treatment: Gait belt Activity Tolerance: Treatment limited secondary to medical complications (Comment) Patient left: in bed;with call bell/phone within reach;with nursing/sitter in room;with family/visitor present Nurse Communication: Mobility status PT Visit Diagnosis: Unsteadiness on feet (R26.81);Difficulty in walking, not elsewhere classified (R26.2)     Time: 8579-8551 PT Time Calculation (min) (ACUTE ONLY): 28 min  Charges:    $Therapeutic Activity: 23-37 mins PT General Charges $$ ACUTE PT VISIT: 1 Visit                     Katheryn Leap  PTA Acute  Rehabilitation Services Office M-F          [phone]

## 2024-11-20 NOTE — Progress Notes (Addendum)
 "  NAME:  Anne [name], MRN:  [id number], DOB:  May 17, 1941, LOS: 9 ADMISSION DATE:  11/10/2024, CONSULTATION DATE:  11/15/23 REFERRING MD:  Stefano Horns, MD CHIEF COMPLAINT:  Worsening hypoxemia   History of Present Illness:  84 year old female recently seen in urgent cough 12/29 for cough however admitted on 12/31 for acute hypoxemic respiratory failure secondary to multifocal pneumonia. Covid, RSV and flu negative. CTA with infiltrates in RUL and LLL with background emphysema. ABG 7.52/36/78. WBC 13, improved to 11.8. Stable anemia 10.  This evening transferred to SDU due to worsening hypoxemia requiring HHFNC. CXR yesterday and today with increase interstitial prominence suggestive of pulmonary edema. IVF discontinued on 11/12/24. Lasix  given 40 mg x 3 in the last 24 hours. Already on empiric antibiotics with azithromycin  and ceftriaxone .  Overnight was placed on BiPAP however having difficulty maintaining mask. When mask is removed she has desaturations. Given 0.5 mg ativan  IV once. Family maintains full code status to TRH. PCCM consulted for airway watch and considering of PRN vs continuous IV sedation.  Of note she was previously followed at [facility] but lost to follow-up. Has tried Trelegy, Symbicort  and budesonide  but self discontinued inhalers due to hoarseness or ineffectiveness however only tried for short periods. Her dyspnea improved after starting iron therapy and doing well off inhalers per last note on 07/14/20.  Pertinent  Medical History  Metastatic breast cancer followed by [facility], s/p radiation, emphysema, dementia  Significant Hospital Events: Including procedures, antibiotic start and stop dates in addition to other pertinent events   12/31 Admitted 1/2 Transferred to SDU 1/3 PCCM consulted 1/3 echocardiogram > LVEF 60-65%, no regional wall motion abnormalities, grade 1 diastolic dysfunction, normal RV size and function.  PAP not estimated. 1/5 remains on 1.2  precedex  1/6 off precedex    Interim History / Subjective:   Yesterday wore bipap most of the day and wanted to sleep. Today asleep on bipap. Does wake up and follow commands. Weaned to 55% fio2.   Objective    Blood pressure (!) 127/49, pulse 97, temperature (!) 97.3 F (36.3 C), temperature source Axillary, resp. rate (!) 27, height 5' 5 (1.651 m), weight 59 kg, SpO2 97%.    FiO2 (%):  [65 %-95 %] 65 % PEEP:  [5 cmH20-10 cmH20] 10 cmH20 Pressure Support:  [3 cmH20-5 cmH20] 5 cmH20   Intake/Output Summary (Last 24 hours) at 11/20/2024 1042 Last data filed at 11/20/2024 0120 Gross per 24 hour  Intake 200 ml  Output 850 ml  Net -650 ml   Filed Weights   11/11/24 0749  Weight: 59 kg    Physical Exam: General:  ill appearing female on bipap HEENT: MM pink/moist; bipap in place Neuro: Alert following commands CV: s1s2, RRR , no m/r/g PULM:  dim clear BS bilaterally; bipap 55% fio2 sats 96% GI: soft, bsx4 active  Extremities: warm/dry, no edema  Skin: no rashes or lesions   Resolved problem list   Assessment and Plan    Acute encephalopathy - multifactorial, improving  ICU delirium Sleep disturbance Sepsis  Steroids  Plan: -limit sedating meds -delirium precautions; need to turn lights on during day and try to get her days/nights oriented -pt/ot -cont home prozac , elavil , ativan   Acute hypoxic resp failure Multifocal PNA Metastatic dz Pneumonitis COPD  Plan: -HHFNC during day; wean fio2 for sats >88% -bipap at bedtime and prn -cont steroids -cont meropenem  -triple therapy nebs -pulm toiletry -pt/ot; oob as tolerated -trend CXR -bnp 328 this am;  850 uop last 24 hours; will give some lasix   Backache Plan: -cont lido patch -pain management  HTN Plan: -start on hydrochlorothiazide  -prn labetalol    1/9 husband updated at bedside     Labs   CBC: Recent Labs  Lab 11/15/24 0711 11/16/24 0708 11/18/24 0505 11/19/24 0326 11/20/24 0150  WBC  13.2* 15.1* 17.5* 20.5* 21.9*  HGB 9.9* 10.4* 10.7* 11.3* 10.5*  HCT 29.7* 30.8* 33.1* 36.0 32.7*  MCV 90.5 91.1 92.7 93.3 91.3  PLT 269 292 430* 401* 418*    Basic Metabolic Panel: Recent Labs  Lab 11/15/24 0711 11/15/24 1815 11/16/24 0708 11/18/24 0505 11/19/24 0326 11/20/24 0150  NA 137  --  139 145 140 140  K 3.3* 3.8 3.7 3.8 4.3 4.1  CL 96*  --  100 108 103 102  CO2 26  --  25 25 24 27   GLUCOSE 114*  --  142* 119* 101* 103*  BUN 22  --  32* 35* 35* 33*  CREATININE 0.58  --  0.45 0.40* 0.49 0.42*  CALCIUM  8.2*  --  8.1* 9.3 9.7 9.5  MG 2.1  --  2.8*  --   --   --   PHOS 3.1  --   --   --   --   --    GFR: Estimated Creatinine Clearance: 48.8 mL/min (A) (by C-G formula based on SCr of 0.42 mg/dL (L)). Recent Labs  Lab 11/16/24 0708 11/18/24 0505 11/19/24 0326 11/20/24 0150  WBC 15.1* 17.5* 20.5* 21.9*    Liver Function Tests: No results for input(s): AST, ALT, ALKPHOS, BILITOT, PROT, ALBUMIN in the last 168 hours. No results for input(s): LIPASE, AMYLASE in the last 168 hours. No results for input(s): AMMONIA in the last 168 hours.  ABG    Component Value Date/Time   PHART 7.56 (H) 11/20/2024 0148   PCO2ART 34 11/20/2024 0148   PO2ART 77 (L) 11/20/2024 0148   HCO3 30.4 (H) 11/20/2024 0148   O2SAT 97.5 11/20/2024 0148     Coagulation Profile: No results for input(s): INR, PROTIME in the last 168 hours.  Cardiac Enzymes: No results for input(s): CKTOTAL, CKMB, CKMBINDEX, TROPONINI in the last 168 hours.  HbA1C: Hgb A1c MFr Bld  Date/Time Value Ref Range Status  04/04/2023 04:13 PM 6.0 4.6 - 6.5 % Final    Comment:    Glycemic Control Guidelines for People with Diabetes:Non Diabetic:  <6%Goal of Therapy: <7%Additional Action Suggested:  >8%   08/29/2022 02:41 PM 6.0 4.6 - 6.5 % Final    Comment:    Glycemic Control Guidelines for People with Diabetes:Non Diabetic:  <6%Goal of Therapy: <7%Additional Action Suggested:  >8%      CBG: Recent Labs  Lab 11/19/24 0754 11/19/24 1135 11/19/24 1707 11/19/24 2127 11/20/24 0831  GLUCAP 105* 109* 164* 144* 106*     Critical Care time: NA   [name] [name] [name] [facility] 11/20/2024, 10:42 AM  Please see [email or web address] for pager details.  From 7A-7P if no response, please call [phone]. After hours, please call ELink [phone].       "

## 2024-11-21 LAB — GLUCOSE, CAPILLARY
Glucose-Capillary: 119 mg/dL — ABNORMAL HIGH (ref 70–99)
Glucose-Capillary: 154 mg/dL — ABNORMAL HIGH (ref 70–99)
Glucose-Capillary: 148 mg/dL — ABNORMAL HIGH (ref 70–99)
Glucose-Capillary: 116 mg/dL — ABNORMAL HIGH (ref 70–99)

## 2024-11-21 LAB — CBC
HCT: 34.9 % — ABNORMAL LOW (ref 36.0–46.0)
Hemoglobin: 11.2 g/dL — ABNORMAL LOW (ref 12.0–15.0)
MCH: 29.6 pg (ref 26.0–34.0)
MCHC: 32.1 g/dL (ref 30.0–36.0)
MCV: 92.1 fL (ref 80.0–100.0)
Platelets: 460 K/uL — ABNORMAL HIGH (ref 150–400)
RBC: 3.79 MIL/uL — ABNORMAL LOW (ref 3.87–5.11)
RDW: 14.6 % (ref 11.5–15.5)
WBC: 21.4 K/uL — ABNORMAL HIGH (ref 4.0–10.5)
nRBC: 0 % (ref 0.0–0.2)

## 2024-11-21 LAB — BASIC METABOLIC PANEL WITH GFR
Anion gap: 12 (ref 5–15)
BUN: 34 mg/dL — ABNORMAL HIGH (ref 8–23)
CO2: 29 mmol/L (ref 22–32)
Calcium: 9.3 mg/dL (ref 8.9–10.3)
Chloride: 99 mmol/L (ref 98–111)
Creatinine, Ser: 0.5 mg/dL (ref 0.44–1.00)
GFR, Estimated: >60 mL/min
Glucose, Bld: 116 mg/dL — ABNORMAL HIGH (ref 70–99)
Potassium: 3.8 mmol/L (ref 3.5–5.1)
Sodium: 139 mmol/L (ref 135–145)

## 2024-11-21 NOTE — Plan of Care (Signed)

## 2024-11-21 NOTE — Progress Notes (Signed)
" °   11/21/24 0912  BiPAP/CPAP/SIPAP  Reason BIPAP/CPAP not in use  (placed on standby, placed back on HHFNC, daughter @ bedside, pt awake, 95%.)  BiPAP/CPAP /SiPAP Vitals  Temp (!) 97.5 F (36.4 C)  Pulse Rate (!) 103  Resp (!) 21  SpO2 96 %  MEWS Score/Color  MEWS Score 2  MEWS Score Color Yellow    "

## 2024-11-21 NOTE — Progress Notes (Signed)
" °   11/21/24 0012  BiPAP/CPAP/SIPAP  BiPAP/CPAP/SIPAP Pt Type Adult  BiPAP/CPAP/SIPAP SERVO (air)  Mask Type Full face mask  Dentures removed? No (patient refuses removal of dentures)  Mask Size Small  Set Rate 15 breaths/min (BUR)  Respiratory Rate 25 breaths/min  IPAP 15 cmH20  EPAP 10 cmH2O  Pressure Support 5 cmH20  PEEP 10 cmH20  FiO2 (%) 70 %  Minute Ventilation 15.3  Leak 34  Peak Inspiratory Pressure (PIP) 15  Tidal Volume (Vt) 681  Patient Home Machine No  Patient Home Mask No  Patient Home Tubing No  Auto Titrate No  Press High Alarm 25 cmH2O  CPAP/SIPAP surface wiped down Yes  Device Plugged into RED Power Outlet Yes  BiPAP/CPAP /SiPAP Vitals  Bilateral Breath Sounds Clear;Diminished    "

## 2024-11-21 NOTE — Progress Notes (Signed)
 "  NAME:  Anne [name], MRN:  [id number], DOB:  [birth year], LOS: 10 ADMISSION DATE:  11/10/2024, CONSULTATION DATE:  11/15/23 REFERRING MD:  Stefano Horns, MD CHIEF COMPLAINT:  Worsening hypoxemia   History of Present Illness:  84 year old female recently seen in urgent cough 12/29 for cough however admitted on 12/31 for acute hypoxemic respiratory failure secondary to multifocal pneumonia. Covid, RSV and flu negative. CTA with infiltrates in RUL and LLL with background emphysema. ABG 7.52/36/78. WBC 13, improved to 11.8. Stable anemia 10.  This evening transferred to SDU due to worsening hypoxemia requiring HHFNC. CXR yesterday and today with increase interstitial prominence suggestive of pulmonary edema. IVF discontinued on 11/12/24. Lasix  given 40 mg x 3 in the last 24 hours. Already on empiric antibiotics with azithromycin  and ceftriaxone .  Overnight was placed on BiPAP however having difficulty maintaining mask. When mask is removed she has desaturations. Given 0.5 mg ativan  IV once. Family maintains full code status to TRH. PCCM consulted for airway watch and considering of PRN vs continuous IV sedation.  Of note she was previously followed at [name] but lost to follow-up. Has tried Trelegy, Symbicort  and budesonide  but self discontinued inhalers due to hoarseness or ineffectiveness however only tried for short periods. Her dyspnea improved after starting iron therapy and doing well off inhalers per last note on 07/14/20.  Pertinent  Medical History  Metastatic breast cancer followed by [facility], s/p radiation, emphysema, dementia  Significant Hospital Events: Including procedures, antibiotic start and stop dates in addition to other pertinent events   12/31 Admitted 1/2 Transferred to SDU 1/3 PCCM consulted 1/3 echocardiogram > LVEF 60-65%, no regional wall motion abnormalities, grade 1 diastolic dysfunction, normal RV size and function.  PAP not estimated. 1/5 remains on 1.2  precedex  1/6 off precedex   1/9 remains on BiPAP and high flow nasal cannula  Interim History / Subjective:   Remains on BiPAP and high flow nasal cannula Today asleep on bipap. Does wake up and follow commands. Weaned to 55% fio2.  Objective    Blood pressure (!) 103/50, pulse (!) 104, temperature (!) 97.5 F (36.4 C), temperature source Oral, resp. rate 19, height 5' 5 (1.651 m), weight 52.7 kg, SpO2 95%.    FiO2 (%):  [60 %-90 %] 87 % PEEP:  [10 cmH20] 10 cmH20 Pressure Support:  [5 cmH20] 5 cmH20   Intake/Output Summary (Last 24 hours) at 11/21/2024 1133 Last data filed at 11/21/2024 0522 Gross per 24 hour  Intake 199.22 ml  Output 500 ml  Net -300.78 ml   Filed Weights   11/11/24 0749 11/21/24 0526  Weight: 59 kg 52.7 kg    Physical Exam: Frail, ill-appearing Lungs with scattered rhonchi Heart rate regular rate and rhythm Extremities with no edema  Labs/imaging reviewed Significant for WBC 21.4, hemoglobin 11.2, platelets 4 6 No new imaging  Resolved problem list   Assessment and Plan  Acute encephalopathy - multifactorial, improving  ICU delirium Sleep disturbance Sepsis  Steroids  Plan: Limit sedating medications Delirium precautions PT/OT On home Prozac , Elavil  and Ativan . Haldol  as needed  Acute hypoxic resp failure Multifocal PNA Metastatic dz Pneumonitis COPD  Plan: Wean down high flow nasal cannula as tolerated BiPAP at bedtime and as needed Continue steroids, meropenem , nebs Pulmonary toilet Get out of bed Follow intermittent chest x-ray  Backache Plan: Lidocaine  patch, pain meds  HTN Plan: DC hydrochlorothiazide  as BP is marginal As needed labetalol   1/10 husband updated at bedside  Signature:  The patient is critically ill with multiple organ system failure and requires high complexity decision making for assessment and support, frequent evaluation and titration of therapies, advanced monitoring, review of radiographic  studies and interpretation of complex data.   Critical Care Time devoted to patient care services, exclusive of separately billable procedures, described in this note is 35 minutes.   Maddyson Keil MD [place] See Amion for pager  If no response to pager , please call [phone] until 7pm After 7:00 pm call Elink  [phone] 11/21/2024, 11:39 AM     "

## 2024-11-21 NOTE — Progress Notes (Signed)
" °   11/21/24 1140  Oxygen Therapy/Pulse Ox  O2 Device HHFNC  SpO2 97 %  O2 Therapy Oxygen humidified  O2 Flow Rate (L/min) 60 L/min  FiO2 (%) (S)  60 % (Weaned to 60%.)  Heated High Flow Nasal Cannula  Adult Medium  Heater temperature 98.6 F (37 C)  Sterile water bottle/bag changed  (Extra @ bedside.)    "

## 2024-11-22 DIAGNOSIS — I110 Hypertensive heart disease with heart failure: Secondary | ICD-10-CM | POA: Diagnosis not present

## 2024-11-22 DIAGNOSIS — A419 Sepsis, unspecified organism: Secondary | ICD-10-CM | POA: Diagnosis not present

## 2024-11-22 DIAGNOSIS — I501 Left ventricular failure: Secondary | ICD-10-CM | POA: Diagnosis not present

## 2024-11-22 DIAGNOSIS — F32A Depression, unspecified: Secondary | ICD-10-CM | POA: Diagnosis not present

## 2024-11-22 DIAGNOSIS — J180 Bronchopneumonia, unspecified organism: Secondary | ICD-10-CM | POA: Diagnosis not present

## 2024-11-22 DIAGNOSIS — Z1152 Encounter for screening for COVID-19: Secondary | ICD-10-CM | POA: Diagnosis not present

## 2024-11-22 DIAGNOSIS — J440 Chronic obstructive pulmonary disease with acute lower respiratory infection: Secondary | ICD-10-CM | POA: Diagnosis not present

## 2024-11-22 DIAGNOSIS — F03A18 Unspecified dementia, mild, with other behavioral disturbance: Secondary | ICD-10-CM | POA: Diagnosis not present

## 2024-11-22 DIAGNOSIS — C7800 Secondary malignant neoplasm of unspecified lung: Secondary | ICD-10-CM | POA: Diagnosis not present

## 2024-11-22 DIAGNOSIS — J9601 Acute respiratory failure with hypoxia: Secondary | ICD-10-CM | POA: Diagnosis not present

## 2024-11-22 DIAGNOSIS — F05 Delirium due to known physiological condition: Secondary | ICD-10-CM | POA: Diagnosis not present

## 2024-11-22 DIAGNOSIS — G9341 Metabolic encephalopathy: Secondary | ICD-10-CM | POA: Diagnosis not present

## 2024-11-22 LAB — BASIC METABOLIC PANEL WITH GFR
Anion gap: 10 (ref 5–15)
BUN: 31 mg/dL — ABNORMAL HIGH (ref 8–23)
CO2: 26 mmol/L (ref 22–32)
Calcium: 8.7 mg/dL — ABNORMAL LOW (ref 8.9–10.3)
Chloride: 101 mmol/L (ref 98–111)
Creatinine, Ser: 0.46 mg/dL (ref 0.44–1.00)
GFR, Estimated: >60 mL/min
Glucose, Bld: 98 mg/dL (ref 70–99)
Potassium: 3.7 mmol/L (ref 3.5–5.1)
Sodium: 137 mmol/L (ref 135–145)

## 2024-11-22 LAB — CBC
HCT: 33.8 % — ABNORMAL LOW (ref 36.0–46.0)
Hemoglobin: 10.8 g/dL — ABNORMAL LOW (ref 12.0–15.0)
MCH: 29.6 pg (ref 26.0–34.0)
MCHC: 32 g/dL (ref 30.0–36.0)
MCV: 92.6 fL (ref 80.0–100.0)
Platelets: 448 K/uL — ABNORMAL HIGH (ref 150–400)
RBC: 3.65 MIL/uL — ABNORMAL LOW (ref 3.87–5.11)
RDW: 14.8 % (ref 11.5–15.5)
WBC: 16.5 K/uL — ABNORMAL HIGH (ref 4.0–10.5)
nRBC: 0 % (ref 0.0–0.2)

## 2024-11-22 LAB — GLUCOSE, CAPILLARY
Glucose-Capillary: 112 mg/dL — ABNORMAL HIGH (ref 70–99)
Glucose-Capillary: 129 mg/dL — ABNORMAL HIGH (ref 70–99)
Glucose-Capillary: 166 mg/dL — ABNORMAL HIGH (ref 70–99)
Glucose-Capillary: 95 mg/dL (ref 70–99)

## 2024-11-22 LAB — TROPONIN T, HIGH SENSITIVITY: Troponin T High Sensitivity: 20 ng/L — ABNORMAL HIGH (ref 0–19)

## 2024-11-22 MED ORDER — AMITRIPTYLINE HCL 25 MG PO TABS
50.0000 mg | ORAL_TABLET | Freq: Every day | ORAL | Status: DC
  Administered 2024-11-22 – 2024-11-30 (×9): 50 mg via ORAL
  Filled 2024-11-22 (×9): qty 2

## 2024-11-22 MED ORDER — NITROGLYCERIN 0.4 MG SL SUBL
0.4000 mg | SUBLINGUAL_TABLET | SUBLINGUAL | Status: DC | PRN
  Administered 2024-11-22: 0.4 mg via SUBLINGUAL
  Filled 2024-11-22 (×2): qty 1

## 2024-11-22 MED ORDER — ORAL CARE MOUTH RINSE: 15.0000 mL | OROMUCOSAL | Status: DC | PRN

## 2024-11-22 MED ORDER — ORAL CARE MOUTH RINSE
15.0000 mL | OROMUCOSAL | Status: DC
  Administered 2024-11-22 – 2024-12-01 (×25): 15 mL via OROMUCOSAL

## 2024-11-22 NOTE — Progress Notes (Signed)
 "  NAME:  Anne [name], MRN:  [id number], DOB:  1941/03/23, LOS: 11 ADMISSION DATE:  11/10/2024, CONSULTATION DATE:  11/15/23 REFERRING MD:  Stefano Horns, MD CHIEF COMPLAINT:  Worsening hypoxemia   History of Present Illness:  84 year old female recently seen in urgent cough 12/29 for cough however admitted on 12/31 for acute hypoxemic respiratory failure secondary to multifocal pneumonia. Covid, RSV and flu negative. CTA with infiltrates in RUL and LLL with background emphysema. ABG 7.52/36/78. WBC 13, improved to 11.8. Stable anemia 10.  This evening transferred to SDU due to worsening hypoxemia requiring HHFNC. CXR yesterday and today with increase interstitial prominence suggestive of pulmonary edema. IVF discontinued on 11/12/24. Lasix  given 40 mg x 3 in the last 24 hours. Already on empiric antibiotics with azithromycin  and ceftriaxone .  Overnight was placed on BiPAP however having difficulty maintaining mask. When mask is removed she has desaturations. Given 0.5 mg ativan  IV once. Family maintains full code status to TRH. PCCM consulted for airway watch and considering of PRN vs continuous IV sedation.  Of note she was previously followed at [facility] but lost to follow-up. Has tried Trelegy, Symbicort  and budesonide  but self discontinued inhalers due to hoarseness or ineffectiveness however only tried for short periods. Her dyspnea improved after starting iron therapy and doing well off inhalers per last note on 07/14/20.  Pertinent  Medical History  Metastatic breast cancer followed by [facility], s/p radiation, emphysema, dementia  Significant Hospital Events: Including procedures, antibiotic start and stop dates in addition to other pertinent events   12/31 Admitted 1/2 Transferred to SDU 1/3 PCCM consulted 1/3 echocardiogram > LVEF 60-65%, no regional wall motion abnormalities, grade 1 diastolic dysfunction, normal RV size and function.  PAP not estimated. 1/5 remains on 1.2  precedex  1/6 off precedex   1/9 remains on BiPAP and high flow nasal cannula  Interim History / Subjective:   Did not require BiPAP overnight but remains on high flow nasal cannula  Requires nonrebreather when she pulls off her high flow nasal Episode of chest pain yesterday.  Troponins normal, EKG unchanged  Objective    Blood pressure 129/64, pulse (!) 102, temperature 97.9 F (36.6 C), temperature source Oral, resp. rate (!) 27, height 5' 5 (1.651 m), weight 52.7 kg, SpO2 91%.    FiO2 (%):  [60 %-100 %] 100 % PEEP:  [10 cmH20] 10 cmH20   Intake/Output Summary (Last 24 hours) at 11/22/2024 9162 Last data filed at 11/21/2024 2343 Gross per 24 hour  Intake 560 ml  Output --  Net 560 ml   Filed Weights   11/11/24 0749 11/21/24 0526  Weight: 59 kg 52.7 kg    Physical Exam: Frail, ill-appearing Lungs clear to auscultation Heart rate regular rate and rhythm Extremities with no edema  Labs/imaging reviewed WBC improved to 16.5, hemoglobin 10.8, platelets 448 BMP stable  Resolved problem list   Assessment and Plan  Acute encephalopathy - multifactorial, improving  ICU delirium Sleep disturbance Sepsis  Steroids  Plan: Limit sedating medications Delirium precautions PT/OT On home Prozac , Elavil  and Ativan . Family concerned that Elavil  may be causing eosinophilic pneumonia.  Will reduce dose from 100mg  to 50 mg at bedtime (she was on 200mg  at home) Haldol  as needed  Acute hypoxic resp failure Multifocal PNA Metastatic dz Pneumonitis COPD  Plan: Wean down high flow nasal cannula as tolerated BiPAP at bedtime and as needed Continue steroids, meropenem , nebs Pulmonary toilet Get out of bed Follow intermittent chest x-ray  Backache  Plan: Lidocaine  patch, pain meds  HTN Plan: DC hydrochlorothiazide  as BP is marginal As needed labetalol   1/11 son updated at bedside  Signature:   The patient is critically ill with multiple organ system failure and  requires high complexity decision making for assessment and support, frequent evaluation and titration of therapies, advanced monitoring, review of radiographic studies and interpretation of complex data.   Critical Care Time devoted to patient care services, exclusive of separately billable procedures, described in this note is 35 minutes.   Gussie Towson MD [place] [facility] See Amion for pager  If no response to pager , please call [phone] until 7pm After 7:00 pm call Elink  [phone] 11/22/2024, 8:37 AM     "

## 2024-11-22 NOTE — TOC Progression Note (Signed)
 Transition of Care [facility]) - Progression Note    Patient Details  Name: Anne [name] MRN: [id number] Date of Birth: 1941/07/03  Transition of [facility]) CM/SW Contact  Sonda Manuella Quill, RN Phone Number: 11/22/2024, 6:20 PM  Clinical Narrative:    Pt remains on HFNC; not ready for d/c; recc HHPT/OT; awaiting eval for ST; [facility] services not yet arranged.   Expected Discharge Plan: Home/Self Care Barriers to Discharge: Continued Medical Work up               Expected Discharge Plan and Services In-house Referral: NA Discharge Planning Services: CM Consult Post Acute Care Choice: NA Living arrangements for the past 2 months: Single Family Home                 DME Arranged: N/A DME Agency: NA       HH Arranged: NA HH Agency: NA         Social Drivers of Health (SDOH) Interventions SDOH Screenings   Food Insecurity: No Food Insecurity (11/11/2024)  Housing: Low Risk (11/11/2024)  Transportation Needs: No Transportation Needs (11/11/2024)  Utilities: Not At Risk (11/11/2024)  Alcohol Screen: Low Risk (07/30/2024)  Depression (PHQ2-9): Low Risk (07/30/2024)  Financial Resource Strain: Low Risk (09/15/2024)  Recent Concern: Financial Resource Strain - Medium Risk (08/19/2024)  Physical Activity: Insufficiently Active (09/15/2024)  Social Connections: Moderately Integrated (11/11/2024)  Recent Concern: Social Connections - Moderately Isolated (09/15/2024)  Stress: No Stress Concern Present (09/15/2024)  Recent Concern: Stress - Stress Concern Present (08/19/2024)  Tobacco Use: Medium Risk (11/11/2024)  Health Literacy: Adequate Health Literacy (07/30/2024)    Readmission Risk Interventions    11/13/2024    2:23 PM  Readmission Risk Prevention Plan  Transportation Screening Complete  PCP or Specialist Appt within 3-5 Days Complete  HRI or Home Care Consult Complete  Social Work Consult for Recovery Care Planning/Counseling Complete  Palliative Care Screening  Complete  Medication Review Oceanographer) Complete

## 2024-11-22 NOTE — Progress Notes (Signed)
 eLink Physician-Brief Progress Note Patient Name: Anne [name] DOB: 06-23-1941 MRN: [id number]   Date of Service  11/22/2024  HPI/Events of Note  Had sudden onset chest pain, unscored, and EKG unchanged, resolved with 1 sublingual nitroglycerin .  No other substantial changes in hemodynamics, intermittently using nonrebreather mostly because she pulls off her HHNC  eICU Interventions  Currently comfortable with a hot pack on her chest, responding appropriately and no follow-up chest pain  Troponin with next lab draw     Intervention Category Intermediate Interventions: Pain - evaluation and management  Myan Locatelli 11/22/2024, 2:40 AM

## 2024-11-22 NOTE — Progress Notes (Addendum)
 Patient having complaints of chest pain, emergency adult standing orders activated, E-Link notified, EKG in chart and one nitroglycerin  given as per orders, family at bedside, patient resting with eyes closed showing no signs of distress, patient on HHFNC at 60 liters with 80% FIO2 oxygen level 91-92%, lungs clear. Labs drawn and EKG uploaded to chart 0230- Patient states she is no longer having chest pain after the one nitroglycerin  was given.

## 2024-11-23 ENCOUNTER — Encounter: Payer: Self-pay | Admitting: *Deleted

## 2024-11-23 ENCOUNTER — Inpatient Hospital Stay (HOSPITAL_COMMUNITY)

## 2024-11-23 DIAGNOSIS — J449 Chronic obstructive pulmonary disease, unspecified: Secondary | ICD-10-CM | POA: Diagnosis not present

## 2024-11-23 DIAGNOSIS — C7981 Secondary malignant neoplasm of breast: Secondary | ICD-10-CM | POA: Diagnosis not present

## 2024-11-23 DIAGNOSIS — J189 Pneumonia, unspecified organism: Secondary | ICD-10-CM | POA: Diagnosis not present

## 2024-11-23 DIAGNOSIS — J9601 Acute respiratory failure with hypoxia: Secondary | ICD-10-CM | POA: Diagnosis not present

## 2024-11-23 DIAGNOSIS — I10 Essential (primary) hypertension: Secondary | ICD-10-CM | POA: Diagnosis not present

## 2024-11-23 LAB — CBC
HCT: 33.2 % — ABNORMAL LOW (ref 36.0–46.0)
Hemoglobin: 10.5 g/dL — ABNORMAL LOW (ref 12.0–15.0)
MCH: 29.2 pg (ref 26.0–34.0)
MCHC: 31.6 g/dL (ref 30.0–36.0)
MCV: 92.5 fL (ref 80.0–100.0)
Platelets: 407 K/uL — ABNORMAL HIGH (ref 150–400)
RBC: 3.59 MIL/uL — ABNORMAL LOW (ref 3.87–5.11)
RDW: 14.5 % (ref 11.5–15.5)
WBC: 14.6 K/uL — ABNORMAL HIGH (ref 4.0–10.5)
nRBC: 0 % (ref 0.0–0.2)

## 2024-11-23 LAB — BASIC METABOLIC PANEL WITH GFR
Anion gap: 9 (ref 5–15)
BUN: 24 mg/dL — ABNORMAL HIGH (ref 8–23)
CO2: 28 mmol/L (ref 22–32)
Calcium: 8.5 mg/dL — ABNORMAL LOW (ref 8.9–10.3)
Chloride: 100 mmol/L (ref 98–111)
Creatinine, Ser: 0.4 mg/dL — ABNORMAL LOW (ref 0.44–1.00)
GFR, Estimated: >60 mL/min
Glucose, Bld: 91 mg/dL (ref 70–99)
Potassium: 3.5 mmol/L (ref 3.5–5.1)
Sodium: 136 mmol/L (ref 135–145)

## 2024-11-23 LAB — GLUCOSE, CAPILLARY
Glucose-Capillary: 98 mg/dL (ref 70–99)
Glucose-Capillary: 144 mg/dL — ABNORMAL HIGH (ref 70–99)
Glucose-Capillary: 120 mg/dL — ABNORMAL HIGH (ref 70–99)
Glucose-Capillary: 123 mg/dL — ABNORMAL HIGH (ref 70–99)

## 2024-11-23 NOTE — Plan of Care (Signed)
 Patient able to sit up in chair most of the day with just the HHFNC, no C/O SOB, up with on assist to the bedside commode and back to bed without oxygen level dropping below 90%.  Problem: Education: Goal: Knowledge of General Education information will improve Description: Including pain rating scale, medication(s)/side effects and non-pharmacologic comfort measures Outcome: Progressing   Problem: Clinical Measurements: Goal: Ability to maintain clinical measurements within normal limits will improve Outcome: Progressing Goal: Will remain free from infection Outcome: Progressing Goal: Diagnostic test results will improve Outcome: Progressing Goal: Respiratory complications will improve Outcome: Progressing Goal: Cardiovascular complication will be avoided Outcome: Progressing   Problem: Activity: Goal: Risk for activity intolerance will decrease Outcome: Progressing   Problem: Coping: Goal: Level of anxiety will decrease Outcome: Progressing   Problem: Safety: Goal: Ability to remain free from injury will improve Outcome: Progressing

## 2024-11-23 NOTE — Progress Notes (Signed)
 "  NAME:  Anne [name], MRN:  [id number], DOB:  1940-11-23, LOS: 12 ADMISSION DATE:  11/10/2024, CONSULTATION DATE:  11/15/23 REFERRING MD:  Stefano Horns, MD CHIEF COMPLAINT:  Worsening hypoxemia   History of Present Illness:  84 year old female recently seen in urgent cough 12/29 for cough however admitted on 12/31 for acute hypoxemic respiratory failure secondary to multifocal pneumonia. Covid, RSV and flu negative. CTA with infiltrates in RUL and LLL with background emphysema. ABG 7.52/36/78. WBC 13, improved to 11.8. Stable anemia 10.  This evening transferred to SDU due to worsening hypoxemia requiring HHFNC. CXR yesterday and today with increase interstitial prominence suggestive of pulmonary edema. IVF discontinued on 11/12/24. Lasix  given 40 mg x 3 in the last 24 hours. Already on empiric antibiotics with azithromycin  and ceftriaxone .  Overnight was placed on BiPAP however having difficulty maintaining mask. When mask is removed she has desaturations. Given 0.5 mg ativan  IV once. Family maintains full code status to TRH. PCCM consulted for airway watch and considering of PRN vs continuous IV sedation.  Of note she was previously followed at [facility] but lost to follow-up. Has tried Trelegy, Symbicort  and budesonide  but self discontinued inhalers due to hoarseness or ineffectiveness however only tried for short periods. Her dyspnea improved after starting iron therapy and doing well off inhalers per last note on 07/14/20.  Pertinent  Medical History  Metastatic breast cancer followed by [facility], s/p radiation, emphysema, dementia  Significant Hospital Events: Including procedures, antibiotic start and stop dates in addition to other pertinent events   12/31 Admitted 1/2 Transferred to SDU 1/3 PCCM consulted 1/3 echocardiogram > LVEF 60-65%, no regional wall motion abnormalities, grade 1 diastolic dysfunction, normal RV size and function.  PAP not estimated. 1/5 remains on 1.2  precedex  1/6 off precedex   1/9 remains on BiPAP and high flow nasal cannula 1/12 no acute issues overnight remains on HHFNC, did not require BiPAP overnight  Interim History / Subjective:  Seen sitting up in bed with no acute complaints organizing mail Spouse at bedside  Objective    Blood pressure 107/63, pulse 91, temperature (!) 97.1 F (36.2 C), temperature source Axillary, resp. rate 19, height 5' 5 (1.651 m), weight 52.7 kg, SpO2 93%.    FiO2 (%):  [80 %] 80 %   Intake/Output Summary (Last 24 hours) at 11/23/2024 1053 Last data filed at 11/22/2024 2200 Gross per 24 hour  Intake 120 ml  Output 450 ml  Net -330 ml   Filed Weights   11/11/24 0749 11/21/24 0526  Weight: 59 kg 52.7 kg    Physical Exam: General Acute on chronic ill-appearing elderly female sitting up in bed on HHFNC in no acute distress HEENT: [place]/AT, MM pink/moist, PERRL,  Neuro: Alert and oriented x 3, nonfocal CV: s1s2 regular rate and rhythm, no murmur, rubs, or gallops,  PULM: Slightly diminished bilaterally, no added breath sounds, no increased work of breathing, on HHFNC 60 L GI: soft, bowel sounds active in all 4 quadrants, non-tender, non-distended, tolerating oral diet Extremities: warm/dry, no edema  Skin: no rashes or lesions   Resolved problem list  Acute encephalopathy - multifactorial, improving  ICU delirium  Assessment and Plan  Acute hypoxic resp failure Multifocal PNA Metastatic breast cancer with lung metastasis Pneumonitis COPD  P: Continue supplemental oxygen with HHFNC, wean as able SpO2 goal greater than 92 Continue steroids, will discuss with attending timing for wean Aspiration precautions Encourage frequent and adequate pulmonary hygiene Mobilize Continue bronchodilators As  needed benzodiazepines  Backache P: Continue lidocaine  patch As needed Tylenol  and Ultram  Minimize sedation as able  HTN P: As needed IV antihypertensives Home hydrochlorothiazide  on  hold  Patient remained stable and no longer requiring continuous BiPAP therefore asked to take over starting 1/13  Signature:  Loc Feinstein D. Harris, NP-C  Pulmonary & Critical Care Personal contact information can be found on Amion  If no contact or response made please call [phone] 11/23/2024, 11:05 AM      "

## 2024-11-23 NOTE — Evaluation (Signed)
 Clinical/Bedside Swallow Evaluation Patient Details  Name: Anne [name] MRN: [id number] Date of Birth: 1941-03-23  Today's Date: 11/23/2024 Time: SLP Start Time (ACUTE ONLY): 1034 SLP Stop Time (ACUTE ONLY): 1055 SLP Time Calculation (min) (ACUTE ONLY): 21 min  Past Medical History:  Past Medical History:  Diagnosis Date   ALKALINE PHOSPHATASE, ELEVATED 03/15/2009   Allergic state 06/10/2012   Allergy     Anemia    Anxiety and depression 04/28/2011   Arthritis    Asthma    Atypical chest pain 11/30/2011   AVM (arteriovenous malformation) of colon 2011   cecum   Baker's cyst of knee 05/22/2011   Cancer (HCC) 11/2006   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 Carotid duplex 03/2014: stable 1-39% BICA, f/u due 03/2016.   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     Clotting disorder    COPD (chronic obstructive pulmonary disease) (HCC)    Dementia (HCC)    Dermatitis 11/23/2012   Dyspnea    EE (eosinophilic esophagitis)    Emphysema of lung (HCC)    Esophageal ring    ESOPHAGEAL STRICTURE 03/29/2009   Fall 11/23/2012   Family history of breast cancer    Family history of colon cancer    Family history of ovarian cancer    Family history of pancreatic cancer    Folliculitis of nose 12/31/2011   GERD (gastroesophageal reflux disease) 09/29/2009   improved s/p cholecystectomy and esophagus dilatation   History of chicken pox    History of measles    History of shingles    2 episodes   Hx of echocardiogram    a. Echo 01/2013: mild LVH, EF 55-60%, normal wall motion, Gr 1 diast dysfn   Hyperlipidemia    Hypertension    Knee pain, bilateral 07/23/2011   Medicare annual wellness visit, subsequent 06/19/2015   Mixed hyperlipidemia 10/17/2010   Qualifier: Diagnosis of  By: Edsel Mems     Orthostasis    Osteopenia 03/14/2011   Osteoporosis    Personal history of  chemotherapy 2001   Personal history of radiation therapy 2001   rt breast   PERSONAL HX BREAST CANCER 09/29/2009   Pneumonia    PVC's (premature ventricular contractions)    a. Event monitor 01/2013: NSR, extensive PVCs.   Radial neck fracture 10/2011   minimally displaced   Substance abuse (HCC)    In remission 3 years.    Urinary incontinence 03/19/2012   Vaginitis 05/22/2011   Past Surgical History:  Past Surgical History:  Procedure Laterality Date   APPENDECTOMY     AUGMENTATION MAMMAPLASTY Right 05/27/2007   BREAST BIOPSY Right 01/02/2007   wire loc   BREAST BIOPSY  12/26/2006   BREAST LUMPECTOMY Right 2001   BREAST RECONSTRUCTION  2008, 2009, 2010   BREAST REDUCTION WITH MASTOPEXY Left 05/30/2017   Procedure: LEFT BREAST REDUCTION FOR SYMTERY WITH MASTOPEXY;  Surgeon: Lowery Estefana RAMAN, DO;  Location: [facility];  Service: Plastics;  Laterality: Left;   CHOLECYSTECTOMY  2010   COLONOSCOPY  09/05/10   cecal avm's   DENTAL SURGERY  05/2016   4 dental implants by Dr. Dorthula.   ERCP  2010    CBD stone extraction    ESOPHAGOGASTRODUODENOSCOPY  01/08/2012   Procedure: ESOPHAGOGASTRODUODENOSCOPY (EGD);  Surgeon: Lupita FORBES Commander, MD;  Location: [facility];  Service: Endoscopy;  Laterality: N/A;  ESOPHAGOGASTRODUODENOSCOPY (EGD) WITH ESOPHAGEAL DILATION  2010, 2012   LAPAROSCOPIC APPENDECTOMY N/A 08/04/2016   Procedure: APPENDECTOMY LAPAROSCOPIC;  Surgeon: Elspeth Schultze, MD;  Location: [facility];  Service: General;  Laterality: N/A;   LIPOSUCTION WITH LIPOFILLING Left 11/21/2017   Procedure: LIPOSUCTION FROM ABDOMEN WITH LIPOFILLING TO LEFT BREAST;  Surgeon: Lowery Estefana RAMAN, DO;  Location: [facility];  Service: Plastics;  Laterality: Left;   MASTECTOMY MODIFIED RADICAL Right 05/27/2007   , Mastectomy modified radical (08), breast reconstruction, CA lesions excised lateral abd wall 2010   MASTOPEXY Left 11/21/2017   Procedure: LEFT BREAST  REVISION MASTOPEXY FOR SYMMETRY;  Surgeon: Lowery Estefana RAMAN, DO;  Location: [facility];  Service: Plastics;  Laterality: Left;   REDUCTION MAMMAPLASTY Left    SAVORY DILATION  01/08/2012   Procedure: SAVORY DILATION;  Surgeon: Lupita FORBES Commander, MD;  Location: [facility];  Service: Endoscopy;  Laterality: N/A;  need xray   HPI:  Anne [name] is an 84 y.o. female who presented to the ED on 11/11/24 following urgent care visit on 12/29 for acute hypoxemic respiratory failure secondary to multifocal PNA. Covid/RSV/flu all negative. CTA showed infiltrates in RUL and LLL with background emphysema. She had worsening hypoxemia and was transferred to SDU. 1/9 CXR reported bilateral interstitial and airspace opacities, slightly increased on right since previous. She remained on Precedex  until 1/6, remained on BiPAP and HHFNC 1/9 but did not require BiPAP overnight 1/11 and as of 1/12 she is on HHFNC at 60L with FiO2 of 80. Started on a regular texture solids, thin liquids diet on 1/6. SLP swallow evaluation ordered 1/11. PMH: h/o esophageal stricture, s/p multiple EGD's with dilations with most recent in 2022. (followed by GI, Dr. Commander), GERD, dementia, asthma, eosinophilic esophagitis, dyspnea, COPD, CAD, breast cancer, s/p chemo.    Assessment / Plan / Recommendation  Clinical Impression  Plan: If further concerns for swallowing/PO intake, SLP recommends f/u with GI as she has a long h/o esophageal dysphagia with stricture. (she is followed by Dr. Commander).   Patient is not currently presenting with clinical s/s of oropharyngeal dysphagia as per this bedside swallow evaluation. SLP assessed swallow as she fed herself PO's of thin liquids (via cup and straw) and regular solids. No overt s/s aspiration and swallow initiation appeared timely. SLP reviewed general esophageal dysphagia/GERD precautions and left handout with patient and spouse.   SLP Visit Diagnosis: Dysphagia,  unspecified (R13.10)    Aspiration Risk  Mild aspiration risk    Diet Recommendation Regular;Thin liquid    Liquid Administration via: Cup;Straw Medication Administration: Other (Comment) (as tolerated) Supervision: Patient able to self feed Compensations: Slow rate;Small sips/bites Postural Changes: Seated upright at 90 degrees;Remain upright for at least 30 minutes after po intake    Other Recommendations Oral Care Recommendations: Oral care BID     Swallow Evaluation Recommendations     Assistance Recommended at Discharge    Functional Status Assessment Patient has not had a recent decline in their functional status  Frequency and Duration            Prognosis        Swallow Study   General Date of Onset: 11/22/24 HPI: Anne [name] is an 84 y.o. female who presented to the ED on 11/11/24 following urgent care visit on 12/29 for acute hypoxemic respiratory failure secondary to multifocal PNA. Covid/RSV/flu all negative. CTA showed infiltrates in RUL and LLL with background emphysema. She had worsening hypoxemia and was  transferred to SDU. 1/9 CXR reported bilateral interstitial and airspace opacities, slightly increased on right since previous. She remained on Precedex  until 1/6, remained on BiPAP and HHFNC 1/9 but did not require BiPAP overnight 1/11 and as of 1/12 she is on HHFNC at 60L with FiO2 of 80. Started on a regular texture solids, thin liquids diet on 1/6. SLP swallow evaluation ordered 1/11. PMH: h/o esophageal stricture, s/p multiple EGD's with dilations with most recent in 2022. (followed by GI, Dr. Avram), GERD, dementia, asthma, eosinophilic esophagitis, dyspnea, COPD, CAD, breast cancer, s/p chemo. Type of Study: Bedside Swallow Evaluation Previous Swallow Assessment: none found Diet Prior to this Study: Regular;Thin liquids (Level 0) Temperature Spikes Noted: No Respiratory Status: Nasal cannula History of Recent Intubation: No Behavior/Cognition:  Alert;Cooperative;Pleasant mood Oral Cavity Assessment: Within Functional Limits Oral Care Completed by SLP: No Oral Cavity - Dentition: Adequate natural dentition Vision: Functional for self-feeding Self-Feeding Abilities: Able to feed self Patient Positioning: Upright in chair Baseline Vocal Quality: Normal Volitional Swallow: Able to elicit    Oral/Motor/Sensory Function Overall Oral Motor/Sensory Function: Within functional limits   Ice Chips     Thin Liquid Thin Liquid: Within functional limits Presentation: Cup;Straw;Self Fed    Nectar Thick     Honey Thick     Puree Puree: Not tested   Solid     Solid: Within functional limits Presentation: Self Fed     Anne IVAR Blase, MA, CCC-SLP Speech Therapy  11/23/2024,11:55 AM

## 2024-11-24 DIAGNOSIS — J189 Pneumonia, unspecified organism: Secondary | ICD-10-CM | POA: Diagnosis not present

## 2024-11-24 LAB — GLUCOSE, CAPILLARY
Glucose-Capillary: 96 mg/dL (ref 70–99)
Glucose-Capillary: 118 mg/dL — ABNORMAL HIGH (ref 70–99)
Glucose-Capillary: 158 mg/dL — ABNORMAL HIGH (ref 70–99)
Glucose-Capillary: 103 mg/dL — ABNORMAL HIGH (ref 70–99)

## 2024-11-24 LAB — CBC
HCT: 34.7 % — ABNORMAL LOW (ref 36.0–46.0)
Hemoglobin: 10.9 g/dL — ABNORMAL LOW (ref 12.0–15.0)
MCH: 29.1 pg (ref 26.0–34.0)
MCHC: 31.4 g/dL (ref 30.0–36.0)
MCV: 92.5 fL (ref 80.0–100.0)
Platelets: 432 K/uL — ABNORMAL HIGH (ref 150–400)
RBC: 3.75 MIL/uL — ABNORMAL LOW (ref 3.87–5.11)
RDW: 14.4 % (ref 11.5–15.5)
WBC: 17.3 K/uL — ABNORMAL HIGH (ref 4.0–10.5)
nRBC: 0 % (ref 0.0–0.2)

## 2024-11-24 LAB — BASIC METABOLIC PANEL WITH GFR
Anion gap: 10 (ref 5–15)
BUN: 17 mg/dL (ref 8–23)
CO2: 27 mmol/L (ref 22–32)
Calcium: 9.1 mg/dL (ref 8.9–10.3)
Chloride: 100 mmol/L (ref 98–111)
Creatinine, Ser: 0.42 mg/dL — ABNORMAL LOW (ref 0.44–1.00)
GFR, Estimated: >60 mL/min
Glucose, Bld: 93 mg/dL (ref 70–99)
Potassium: 3.9 mmol/L (ref 3.5–5.1)
Sodium: 136 mmol/L (ref 135–145)

## 2024-11-24 LAB — MAGNESIUM: Magnesium: 2.3 mg/dL (ref 1.7–2.4)

## 2024-11-24 LAB — PHOSPHORUS: Phosphorus: 2.5 mg/dL (ref 2.5–4.6)

## 2024-11-24 MED ORDER — MELATONIN 3 MG PO TABS
3.0000 mg | ORAL_TABLET | Freq: Every day | ORAL | Status: DC
  Administered 2024-11-24 – 2024-11-26 (×3): 3 mg via ORAL
  Filled 2024-11-24 (×4): qty 1

## 2024-11-24 NOTE — Plan of Care (Signed)
" °  Problem: Education: Goal: Knowledge of General Education information will improve Description: Including pain rating scale, medication(s)/side effects and non-pharmacologic comfort measures Outcome: Progressing   Problem: Health Behavior/Discharge Planning: Goal: Ability to manage health-related needs will improve Outcome: Progressing   Problem: Clinical Measurements: Goal: Ability to maintain clinical measurements within normal limits will improve Outcome: Progressing   Problem: Activity: Goal: Risk for activity intolerance will decrease Outcome: Progressing   Problem: Clinical Measurements: Goal: Respiratory complications will improve Outcome: Not Progressing   "

## 2024-11-24 NOTE — Progress Notes (Addendum)
 " PROGRESS NOTE    Anne [name]  FMW:[id number] DOB: 12-Mar-1941 DOA: 11/10/2024 PCP: Domenica Harlene LABOR, MD   Brief Narrative:  84 year old female with history of metastatic breast cancer followed by [facility] presenting, COPD, anxiety/depression, dementia presented with worsening shortness of breath and was admitted for acute respiratory failure with hypoxia due to multifocal pneumonia.  COVID/RSV/influenza PCR negative.  She was started on broad-spectrum antibiotics.  She was transferred to SDU due to worsening hypoxemia requiring heated high flow nasal cannula oxygen.  PCCM was consulted.  Echo showed EF of 60 to 65% with grade 1 diastolic dysfunction.  She required Precedex  drip as well but subsequently was weaned off of the same.  She then required BiPAP and care was transferred to [facility] service from 11/16/2024 onwards.  She has finished a course of meropenem .  She is currently on high flow nasal cannula oxygen.  Care has been transferred back to [facility] service from 11/24/2024 onwards.  Assessment & Plan:   Acute hypoxic respiratory failure Multifocal pneumonia Metastatic breast cancer with lung metastases Pneumonitis COPD - Remains on 60 L heated high flow nasal cannula oxygen.  Continue BiPAP at bedtime and as needed.  Wean off as able. Care has been transferred back to [facility] service from 11/24/2024 onwards. - Completed course of meropenem . - Continue steroids, nebs.  Chest x-ray on 11/23/2024 had shown interval progression of bilateral interstitial densities  Acute metabolic encephalopathy: Multifactorial History of dementia Anxiety/depression - Mental status improving.  Off Precedex  drip.  Continue delirium precautions.  PT/OT eval. - Continue fluoxetine .  Family was concerned that amitriptyline  might be causing eosinophilic pneumonia: Dose has been decreased from 100 mg to 50 mg.  Continue Ativan  as needed.  Has not required Haldol  for the last few days.  Leukocytosis - Monitor  Anemia of  chronic disease - From chronic illnesses.  Hemoglobin stable.  No signs of bleeding.  Monitor intermittently  Thrombocytosis - Possibly reactive.  Monitor intermittently.  Backache - Continue lidocaine  patch and as needed tramadol   Hypertension - Monitor blood pressure.  Intermittently on the lower side.  Hydrochlorothiazide  has already been discontinued.  Continue as needed labetalol   Metastatic breast cancer Outpatient follow-up with oncology at [facility]  Goals of care - Overall prognosis is guarded to poor.  Consult palliative care for further goals of care discussion    DVT prophylaxis: Lovenox  Code Status: Full Family Communication: Husband at bedside Disposition Plan: Status is: Inpatient Remains inpatient appropriate because: Of severity of illness    Consultants: PCCM.  Consult palliative care  Procedures: 2D echo  Antimicrobials:  Anti-infectives (From admission, onward)    Start     Dose/Rate Route Frequency Ordered Stop   11/17/24 0900  vancomycin  (VANCOCIN ) IVPB 1000 mg/200 mL premix  Status:  Discontinued       Placed in Followed by Linked Group   1,000 mg 200 mL/hr over 60 Minutes Intravenous Every 24 hours 11/16/24 0748 11/17/24 0803   11/16/24 0900  vancomycin  (VANCOREADY) IVPB 1250 mg/250 mL       Placed in Followed by Linked Group   1,250 mg 166.7 mL/hr over 90 Minutes Intravenous  Once 11/16/24 0748 11/16/24 0953   11/15/24 1045  meropenem  (MERREM ) 1 g in sodium chloride  0.9 % 100 mL IVPB        1 g 200 mL/hr over 30 Minutes Intravenous Every 12 hours 11/15/24 0946 11/21/24 2343   11/11/24 2200  vancomycin  (VANCOCIN ) IVPB 1000 mg/200 mL premix  Status:  Discontinued        1,000 mg 200 mL/hr over 60 Minutes Intravenous Every 24 hours 11/11/24 0814 11/11/24 1630   11/11/24 2100  cefTRIAXone  (ROCEPHIN ) 2 g in sodium chloride  0.9 % 100 mL IVPB  Status:  Discontinued        2 g 200 mL/hr over 30 Minutes Intravenous Every 24 hours 11/11/24 1613  11/15/24 0936   11/11/24 1800  azithromycin  (ZITHROMAX ) 500 mg in sodium chloride  0.9 % 250 mL IVPB        500 mg 250 mL/hr over 60 Minutes Intravenous Every 24 hours 11/11/24 1613 11/15/24 1903   11/11/24 1000  ceFEPIme  (MAXIPIME ) 2 g in sodium chloride  0.9 % 100 mL IVPB  Status:  Discontinued        2 g 200 mL/hr over 30 Minutes Intravenous Every 12 hours 11/11/24 0757 11/11/24 1630   11/10/24 2015  vancomycin  (VANCOCIN ) IVPB 1000 mg/200 mL premix        1,000 mg 200 mL/hr over 60 Minutes Intravenous  Once 11/10/24 2012 11/10/24 2358   11/10/24 2015  ceFEPIme  (MAXIPIME ) 2 g in sodium chloride  0.9 % 100 mL IVPB        2 g 200 mL/hr over 30 Minutes Intravenous  Once 11/10/24 2012 11/10/24 2218       Subjective: Patient seen and examined at bedside.  Denies fever, vomiting, abdominal pain.  Currently comfortable on heated high flow nasal cannula oxygen.  Objective: Vitals:   11/24/24 0833 11/24/24 0834 11/24/24 0900 11/24/24 1000  BP:   (!) 114/49 117/73  Pulse:   92 92  Resp:   20   Temp:      TempSrc:      SpO2: 92% 90% 93% 94%  Weight:      Height:        Intake/Output Summary (Last 24 hours) at 11/24/2024 1116 Last data filed at 11/24/2024 0930 Gross per 24 hour  Intake --  Output 500 ml  Net -500 ml   Filed Weights   11/11/24 0749 11/21/24 0526 11/24/24 0622  Weight: 59 kg 52.7 kg 52.7 kg    Examination:  General exam: Appears calm and comfortable.  Chronically ill and deconditioned.  On 60L heated high flow nasal cannula oxygen Respiratory system: Bilateral decreased breath sounds at bases with scattered crackles Cardiovascular system: S1 & S2 heard, Rate controlled Gastrointestinal system: Abdomen is nondistended, soft and nontender. Normal bowel sounds heard. Extremities: No cyanosis, clubbing, edema  Central nervous system: Alert and oriented.  Slow to respond.  No focal neurological deficits. Moving extremities Skin: No rashes, lesions or  ulcers Psychiatry: Flat affect.  Not agitated.    Data Reviewed: I have personally reviewed following labs and imaging studies  CBC: Recent Labs  Lab 11/20/24 0150 11/21/24 0712 11/22/24 0241 11/23/24 0817 11/24/24 1003  WBC 21.9* 21.4* 16.5* 14.6* 17.3*  HGB 10.5* 11.2* 10.8* 10.5* 10.9*  HCT 32.7* 34.9* 33.8* 33.2* 34.7*  MCV 91.3 92.1 92.6 92.5 92.5  PLT 418* 460* 448* 407* 432*   Basic Metabolic Panel: Recent Labs  Lab 11/20/24 0150 11/21/24 0712 11/22/24 0241 11/23/24 0817 11/24/24 1003  NA 140 139 137 136 136  K 4.1 3.8 3.7 3.5 3.9  CL 102 99 101 100 100  CO2 27 29 26 28 27   GLUCOSE 103* 116* 98 91 93  BUN 33* 34* 31* 24* 17  CREATININE 0.42* 0.50 0.46 0.40* 0.42*  CALCIUM  9.5 9.3 8.7* 8.5* 9.1  MG  --   --   --   --  2.3  PHOS  --   --   --   --  2.5   GFR: Estimated Creatinine Clearance: 45.1 mL/min (A) (by C-G formula based on SCr of 0.42 mg/dL (L)). Liver Function Tests: No results for input(s): AST, ALT, ALKPHOS, BILITOT, PROT, ALBUMIN in the last 168 hours. No results for input(s): LIPASE, AMYLASE in the last 168 hours. No results for input(s): AMMONIA in the last 168 hours. Coagulation Profile: No results for input(s): INR, PROTIME in the last 168 hours. Cardiac Enzymes: No results for input(s): CKTOTAL, CKMB, CKMBINDEX, TROPONINI in the last 168 hours. BNP (last 3 results) Recent Labs    11/10/24 1707 11/14/24 0843 11/20/24 0150  PROBNP 520.0* 429.0* 328.0*   HbA1C: No results for input(s): HGBA1C in the last 72 hours. CBG: Recent Labs  Lab 11/23/24 0753 11/23/24 1127 11/23/24 1600 11/23/24 2125 11/24/24 0803  GLUCAP 98 144* 120* 123* 96   Lipid Profile: No results for input(s): CHOL, HDL, LDLCALC, TRIG, CHOLHDL, LDLDIRECT in the last 72 hours. Thyroid  Function Tests: No results for input(s): TSH, T4TOTAL, FREET4, T3FREE, THYROIDAB in the last 72 hours. Anemia Panel: No  results for input(s): VITAMINB12, FOLATE, FERRITIN, TIBC, IRON, RETICCTPCT in the last 72 hours. Sepsis Labs: No results for input(s): PROCALCITON, LATICACIDVEN in the last 168 hours.  Recent Results (from the past 240 hours)  MRSA Next Gen by PCR, Nasal     Status: None   Collection Time: 11/16/24 10:50 AM   Specimen: Nasal Mucosa; Nasal Swab  Result Value Ref Range Status   MRSA by PCR Next Gen NOT DETECTED NOT DETECTED Final    Comment: (NOTE) The GeneXpert MRSA Assay (FDA approved for NASAL specimens only), is one component of a comprehensive MRSA colonization surveillance program. It is not intended to diagnose MRSA infection nor to guide or monitor treatment for MRSA infections. Test performance is not FDA approved in patients less than 9 years old. Performed at [facility], [address], [place], [place] [address]          Radiology Studies: [facility] Chest Port 1 View Result Date: 11/23/2024 CLINICAL DATA:  Respiratory failure. EXAM: PORTABLE CHEST 1 VIEW COMPARISON:  Chest radiograph dated 11/20/2024. FINDINGS: Interval progression of bilateral interstitial densities. No large pleural effusion or pneumothorax. Stable cardiac silhouette no acute osseous pathology. IMPRESSION: Interval progression of bilateral interstitial densities. Electronically Signed   By: Vanetta Chou M.D.   On: 11/23/2024 18:53        Scheduled Meds:  amitriptyline   50 mg Oral QHS   arformoterol   15 mcg Nebulization BID   aspirin  EC  81 mg Oral Daily   brimonidine   1 drop Both Eyes Daily   budesonide  (PULMICORT ) nebulizer solution  0.5 mg Nebulization BID   Chlorhexidine  Gluconate Cloth  6 each Topical Daily   enoxaparin  (LOVENOX ) injection  40 mg Subcutaneous Q24H   FLUoxetine   60 mg Oral Daily   guaiFENesin -dextromethorphan   10 mL Oral TID   lidocaine   1 patch Transdermal Q24H   methylPREDNISolone  (SOLU-MEDROL ) injection  40 mg Intravenous Daily   mouth  rinse  15 mL Mouth Rinse 4 times per day   pantoprazole  (PROTONIX ) IV  40 mg Intravenous Q24H   revefenacin   175 mcg Nebulization Daily   thiamine   100 mg Oral Daily   Continuous Infusions:        Sophie Mao, MD [facility] 11/24/2024, 11:16 AM   "

## 2024-11-24 NOTE — Progress Notes (Signed)
 PT Cancellation Note  Patient Details Name: Anne [name] MRN: [id number] DOB: 11/21/1940   Cancelled Treatment:    Reason Eval/Treat Not Completed: Medical issues which prohibited therapy  Patient is up in recliner, family at bedside. Patient reports that she feels like she is moving well but her respiratory is limiting and does not feel that she can do more than sit in recliner.  Patient has theraband at bedside and reports performs when her daughter is present. Continue progressive  mobility as respiratory status improves. Darice Potters PT Acute Rehabilitation Services Office [phone]  Potters Darice Norris 11/24/2024, 2:50 PM

## 2024-11-24 NOTE — Plan of Care (Signed)
  Problem: Education: Goal: Knowledge of General Education information will improve Description: Including pain rating scale, medication(s)/side effects and non-pharmacologic comfort measures Outcome: Progressing   Problem: Health Behavior/Discharge Planning: Goal: Ability to manage health-related needs will improve Outcome: Progressing   Problem: Clinical Measurements: Goal: Respiratory complications will improve Outcome: Progressing   

## 2024-11-25 DIAGNOSIS — F419 Anxiety disorder, unspecified: Secondary | ICD-10-CM | POA: Diagnosis not present

## 2024-11-25 DIAGNOSIS — J9601 Acute respiratory failure with hypoxia: Secondary | ICD-10-CM | POA: Diagnosis not present

## 2024-11-25 DIAGNOSIS — D72829 Elevated white blood cell count, unspecified: Secondary | ICD-10-CM | POA: Diagnosis not present

## 2024-11-25 DIAGNOSIS — J449 Chronic obstructive pulmonary disease, unspecified: Secondary | ICD-10-CM

## 2024-11-25 DIAGNOSIS — F32A Depression, unspecified: Secondary | ICD-10-CM

## 2024-11-25 DIAGNOSIS — C50911 Malignant neoplasm of unspecified site of right female breast: Secondary | ICD-10-CM

## 2024-11-25 DIAGNOSIS — J189 Pneumonia, unspecified organism: Secondary | ICD-10-CM | POA: Diagnosis not present

## 2024-11-25 DIAGNOSIS — G9341 Metabolic encephalopathy: Secondary | ICD-10-CM | POA: Diagnosis not present

## 2024-11-25 DIAGNOSIS — I10 Essential (primary) hypertension: Secondary | ICD-10-CM

## 2024-11-25 DIAGNOSIS — C792 Secondary malignant neoplasm of skin: Secondary | ICD-10-CM | POA: Diagnosis not present

## 2024-11-25 DIAGNOSIS — J188 Other pneumonia, unspecified organism: Secondary | ICD-10-CM | POA: Diagnosis not present

## 2024-11-25 LAB — CBC WITH DIFFERENTIAL/PLATELET
Abs Immature Granulocytes: 0.27 K/uL — ABNORMAL HIGH (ref 0.00–0.07)
Basophils Absolute: 0 K/uL (ref 0.0–0.1)
Basophils Relative: 0 %
Eosinophils Absolute: 0.3 K/uL (ref 0.0–0.5)
Eosinophils Relative: 2 %
HCT: 32 % — ABNORMAL LOW (ref 36.0–46.0)
Hemoglobin: 10.1 g/dL — ABNORMAL LOW (ref 12.0–15.0)
Immature Granulocytes: 2 %
Lymphocytes Relative: 11 %
Lymphs Abs: 1.6 K/uL (ref 0.7–4.0)
MCH: 29.2 pg (ref 26.0–34.0)
MCHC: 31.6 g/dL (ref 30.0–36.0)
MCV: 92.5 fL (ref 80.0–100.0)
Monocytes Absolute: 1.2 K/uL — ABNORMAL HIGH (ref 0.1–1.0)
Monocytes Relative: 8 %
Neutro Abs: 10.7 K/uL — ABNORMAL HIGH (ref 1.7–7.7)
Neutrophils Relative %: 77 %
Platelets: 451 K/uL — ABNORMAL HIGH (ref 150–400)
RBC: 3.46 MIL/uL — ABNORMAL LOW (ref 3.87–5.11)
RDW: 14.5 % (ref 11.5–15.5)
WBC: 14 K/uL — ABNORMAL HIGH (ref 4.0–10.5)
nRBC: 0 % (ref 0.0–0.2)

## 2024-11-25 LAB — BASIC METABOLIC PANEL WITH GFR
Anion gap: 11 (ref 5–15)
BUN: 19 mg/dL (ref 8–23)
CO2: 28 mmol/L (ref 22–32)
Calcium: 8.6 mg/dL — ABNORMAL LOW (ref 8.9–10.3)
Chloride: 99 mmol/L (ref 98–111)
Creatinine, Ser: 0.5 mg/dL (ref 0.44–1.00)
GFR, Estimated: >60 mL/min
Glucose, Bld: 83 mg/dL (ref 70–99)
Potassium: 3.2 mmol/L — ABNORMAL LOW (ref 3.5–5.1)
Sodium: 139 mmol/L (ref 135–145)

## 2024-11-25 LAB — MAGNESIUM: Magnesium: 2.4 mg/dL (ref 1.7–2.4)

## 2024-11-25 LAB — PHOSPHORUS: Phosphorus: 3.3 mg/dL (ref 2.5–4.6)

## 2024-11-25 LAB — GLUCOSE, CAPILLARY
Glucose-Capillary: 94 mg/dL (ref 70–99)
Glucose-Capillary: 185 mg/dL — ABNORMAL HIGH (ref 70–99)
Glucose-Capillary: 149 mg/dL — ABNORMAL HIGH (ref 70–99)
Glucose-Capillary: 107 mg/dL — ABNORMAL HIGH (ref 70–99)

## 2024-11-25 MED ORDER — POTASSIUM CHLORIDE CRYS ER 20 MEQ PO TBCR
40.0000 meq | EXTENDED_RELEASE_TABLET | Freq: Once | ORAL | Status: AC
  Administered 2024-11-25: 40 meq via ORAL
  Filled 2024-11-25: qty 2

## 2024-11-25 NOTE — Consult Note (Signed)
 "                                                                                   Consultation Note Date: 11/25/2024   Patient Name: Anne [name]  DOB: [birth year]  MRN: [id number]  Age / Sex: 84 y.o., female  PCP: Domenica Harlene LABOR, MD Referring Physician: Cheryle Bouquet  Reason for Consultation:  goals of care  HPI/Patient Profile: 84 y.o. female  with past medical history of breast cancer with mets to lungs and lymph nodes on treatment at [facility], COPD, anxiety/depression,  admitted on 11/10/2024 with multifocal pneumonia. She previously required bipap. Now on heated high flow oxygen. Palliative medicine consulted for GOC.    Primary Decision Maker PATIENT  Discussion: Chart reviewed- PET scan from [facility] 12/25 reviewed- showed mets to lungs and pleura.  She is currently on 50LPM high flow oxygen which is an improvement from yesterday.  On eval she is sitting up in chair at bedside. She is comfortable. She gets short of breath with exertion.  She is married to her spouse who was also present- married for over 60 years. They have 3 kids and many grandchildren.  Prior to admission she was independent with high functional status. She recently traveled to [facility].  She enjoys playing the piano and decorating her home.  Her goals of care are to be able to return home and play her piano.  Advanced Care Planning was discussed.  She desires full scope care including intubation if necessary.  Code status was discussed- she wishes to remain full code for now. She notes that if she were having a great deal of pain then she would choose comfort measures and DNR status along with support from hospice.  If she were unable to make decisions for herself she would want her daughter- Carlyon to be runner, broadcasting/film/video. We discussed completing an HCPOA and she wishes to do this during this admission.     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 [facility])   Review of Systems  Physical Exam  Vital Signs: BP 126/82   Pulse 94   Temp 97.9 F (36.6 C) (Oral)   Resp 19   Ht 5' 5 (1.651 m)   Wt 52.7 kg   SpO2 100%   BMI 19.33 kg/m  Pain Scale: 0-10 POSS *See Group Information*: S-Acceptable,Sleep, easy to arouse Pain Score: 0-No pain   SpO2: SpO2: 100 % O2 Device:SpO2: 100 % O2 Flow Rate: .O2 Flow Rate (L/min): (S) 50 L/min  IO: Intake/output summary: No intake or output data in the 24 hours ending 11/25/24 1434  LBM: Last BM Date : 11/22/24 Baseline Weight: Weight: 59 kg Most recent weight: Weight: 52.7 kg       Thank you for this consult.  Palliative medicine will continue to follow and assist as needed.  Time Total: 80 minutes Signed by: Cassondra Stain, AGNP-C Palliative Medicine  Time includes:   I personally spent a total of 80 minutes in the care of the patient today including preparing to see the patient, getting/reviewing separately obtained history, performing a medically appropriate exam/evaluation, counseling and educating, placing orders, referring and communicating with other health care professionals, and documenting clinical information in the EHR.   Please contact Palliative Medicine Team phone at [phone] for questions and concerns.  For individual provider: See Amion               "

## 2024-11-25 NOTE — Progress Notes (Signed)
 Occupational Therapy Treatment Patient Details Name: Anne [name] MRN: [id number] DOB: 09/17/1941 Today's Date: 11/25/2024   History of present illness Anne [name] is a 84 year old woman admitted with cough and found to have pneumonia, acute hypoxic respiratory failure. PMH metastatic breast cancer    OT comments  The pt was seen for functional strengthening, progression of out of bed activity, and instruction on therapeutic exercises for strengthening needed to facilitate progressive ADL performance. She required min assist to stand from the EOB, then min assist to step-pivot to the bedside chair. Once seated in the bedside chair, she was instructed on upper body therapeutic exercises using a light resistance exercise band. She required SBA to perform 10 reps and 1 set of vertical exchanges, horizontal pulls and tricep extensions; she required a short therapeutic rest break between exercise sets. She was noted to be on HHFNC, with her O2 saturation briefly decreasing to 93% with activity. Continue OT plan of care. Home health OT is recommended.        If plan is discharge home, recommend the following:  Assistance with cooking/housework;Help with stairs or ramp for entrance;Assist for transportation;A lot of help with bathing/dressing/bathroom;A little help with walking and/or transfers   Equipment Recommendations  Tub/shower bench    Recommendations for Other Services      Precautions / Restrictions Precautions Precautions: Fall Restrictions Weight Bearing Restrictions Per Provider Order: No       Mobility Bed Mobility Overal bed mobility: Needs Assistance Bed Mobility: Supine to Sit     Supine to sit: Supervision, HOB elevated, Used rails          Transfers Overall transfer level: Needs assistance Equipment used: None, 1 person hand held assist Transfers: Sit to/from Stand Sit to Stand: Min assist     Step pivot transfers: Min assist           Balance      Sitting balance-Leahy Scale: Good         Standing balance comment: Min assist            ADL either performed or assessed with clinical judgement              Communication Communication Communication: No apparent difficulties   Cognition Arousal: Alert Behavior During Therapy: WFL for tasks assessed/performed               OT - Cognition Comments: Oriented x4                 Following commands: Intact        Cueing   Cueing Techniques: Verbal cues             Pertinent Vitals/ Pain       Pain Assessment Pain Assessment: No/denies pain   Frequency  Min 2X/week        Progress Toward Goals  OT Goals(current goals can now be found in the care plan section)  Progress towards OT goals: Progressing toward goals  Acute Rehab OT Goals OT Goal Formulation: With patient/family Time For Goal Achievement: 12/02/24 Potential to Achieve Goals: Good  Plan         AM-PAC OT 6 Clicks Daily Activity     Outcome Measure   Help from another person eating meals?: None Help from another person taking care of personal grooming?: A Little Help from another person toileting, which includes using toliet, bedpan, or urinal?: A Lot Help from another person bathing (including washing, rinsing, drying)?: A Lot Help  from another person to put on and taking off regular upper body clothing?: A Little Help from another person to put on and taking off regular lower body clothing?: A Little 6 Click Score: 17    End of Session Equipment Utilized During Treatment: Gait belt;Oxygen  OT Visit Diagnosis: Unsteadiness on feet (R26.81);Other abnormalities of gait and mobility (R26.89);Muscle weakness (generalized) (M62.81)   Activity Tolerance Patient tolerated treatment well   Patient Left in chair;with call bell/phone within reach;with family/visitor present   Nurse Communication Mobility status        Time: 8381-8361 OT Time Calculation (min): 20  min  Charges: OT General Charges $OT Visit: 1 Visit OT Treatments $Therapeutic Activity: 8-22 mins    Delanna JINNY Lesches, OTR/L 11/25/2024, 5:35 PM

## 2024-11-25 NOTE — Plan of Care (Signed)
  Problem: Education: Goal: Knowledge of General Education information will improve Description: Including pain rating scale, medication(s)/side effects and non-pharmacologic comfort measures Outcome: Progressing   Problem: Health Behavior/Discharge Planning: Goal: Ability to manage health-related needs will improve Outcome: Progressing   Problem: Clinical Measurements: Goal: Ability to maintain clinical measurements within normal limits will improve Outcome: Progressing Goal: Diagnostic test results will improve Outcome: Progressing Goal: Respiratory complications will improve Outcome: Progressing   Problem: Activity: Goal: Risk for activity intolerance will decrease Outcome: Progressing   

## 2024-11-25 NOTE — Progress Notes (Addendum)
 " PROGRESS NOTE    Anne [name]  FMW:[id number] DOB: 1941/09/05 DOA: 11/10/2024 PCP: Domenica Harlene LABOR, MD    Chief Complaint  Patient presents with   Chest Pain    Brief Narrative:  84 year old female with history of metastatic breast cancer followed by [facility] presenting, COPD, anxiety/depression, dementia presented with worsening shortness of breath and was admitted for acute respiratory failure with hypoxia due to multifocal pneumonia.  COVID/RSV/influenza PCR negative.  She was started on broad-spectrum antibiotics.  She was transferred to SDU due to worsening hypoxemia requiring heated high flow nasal cannula oxygen.  PCCM was consulted.  Echo showed EF of 60 to 65% with grade 1 diastolic dysfunction.  She required Precedex  drip as well but subsequently was weaned off of the same.  She then required BiPAP and care was transferred to [facility] service from 11/16/2024 onwards.  She has finished a course of meropenem .  She is currently on high flow nasal cannula oxygen.  Care has been transferred back to [facility] service from 11/24/2024 onwards.    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 - Patient with clinical improvement however still with high O2 requirements. - Patient was on PCCM service send service transferred to [facility] service on 11/24/2024. - Status post completion of course of meropenem . - Chest x-ray 11/23/2024 with interval progression of  bilateral interstitial densities. - Continue Brovana  nebs, Pulmicort  nebs, IV Solu-Medrol  40 mg daily, IV PPI,yupelri  nebs. - Continue to wean O2. - Will need outpatient follow-up with pulmonary.  2.  Acute metabolic encephalopathy/history of dementia/depression/anxiety -Mental status improving. - Patient has been weaned off Precedex  drip. - Continue fluoxetine . 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 - IV Ativan  as needed. - Noted to have not required Haldol  over the past few days. - Delirium precautions. - PT/OT.  3.  Leukocytosis -Leukocytosis trending down. -Status post completion of course of meropenem . - Patient noted on steroids. - Follow.  4.  Anemia of chronic disease -Patient with no overt signs of bleeding. - Hemoglobin stable at 10.1.  5.  Thrombocytosis -Felt likely to be reactive. - Follow.  6.  Backache -Continue lidocaine  patch, tramadol  as needed.  7.  Hypertension -BP noted to be on the softer side. - HCTZ discontinued. - Labetalol  as needed.  8.  Metastatic breast cancer -Outpatient follow-up with primary oncologist at [facility].  9.  Hypokalemia -Replete.  10.  Goals of care -Patient noted with overall poor prognosis.  Palliative care was consulted for goals of care.    DVT prophylaxis: Lovenox  Code Status: Full Family Communication: Updated patient and husband at bedside. Disposition: TBD  Status is: Inpatient Remains inpatient appropriate because: Severity of illness   Consultants:  PCCM: Dr. Isaiah 11/14/2024 Palliative care: Dr. Clayton 11/25/2024  Procedures:  CT angiogram chest 11/10/2024 2D echo 11/14/2024  Significant Hospital Events: Including procedures, antibiotic start  and stop dates in addition to other pertinent events   12/31 Admitted 1/2 Transferred to SDU 1/3 PCCM consulted 1/3 echocardiogram > LVEF 60-65%, no  regional wall motion abnormalities, grade 1 diastolic dysfunction, normal RV size and function.  PAP not estimated. 1/5 remains on 1.2 precedex  1/6 off precedex   1/9 remains on BiPAP and high flow nasal cannula 1/12 no acute issues overnight remains on HHFNC, did not require BiPAP overnight   Antimicrobials: Anti-infectives (From admission, onward)    Start     Dose/Rate Route Frequency Ordered Stop   11/17/24 0900  vancomycin  (VANCOCIN ) IVPB 1000 mg/200 mL premix  Status:  Discontinued       Placed in Followed by Linked Group   1,000 mg 200 mL/hr over 60 Minutes Intravenous Every 24 hours 11/16/24 0748 11/17/24 0803   11/16/24 0900  vancomycin  (VANCOREADY) IVPB 1250 mg/250 mL       Placed in Followed by Linked Group   1,250 mg 166.7 mL/hr over 90 Minutes Intravenous  Once 11/16/24 0748 11/16/24 0953   11/15/24 1045  meropenem  (MERREM ) 1 g in sodium chloride  0.9 % 100 mL IVPB        1 g 200 mL/hr over 30 Minutes Intravenous Every 12 hours 11/15/24 0946 11/21/24 2343   11/11/24 2200  vancomycin  (VANCOCIN ) IVPB 1000 mg/200 mL premix  Status:  Discontinued        1,000 mg 200 mL/hr over 60 Minutes Intravenous Every 24 hours 11/11/24 0814 11/11/24 1630   11/11/24 2100  cefTRIAXone  (ROCEPHIN ) 2 g in sodium chloride  0.9 % 100 mL IVPB  Status:  Discontinued        2 g 200 mL/hr over 30 Minutes Intravenous Every 24 hours 11/11/24 1613 11/15/24 0936   11/11/24 1800  azithromycin  (ZITHROMAX ) 500 mg in sodium chloride  0.9 % 250 mL IVPB        500 mg 250 mL/hr over 60 Minutes Intravenous Every 24 hours 11/11/24 1613 11/15/24 1903   11/11/24 1000  ceFEPIme  (MAXIPIME ) 2 g in sodium chloride  0.9 % 100 mL IVPB  Status:  Discontinued        2 g 200 mL/hr over 30 Minutes Intravenous Every 12 hours 11/11/24 0757 11/11/24 1630   11/10/24 2015  vancomycin  (VANCOCIN ) IVPB 1000 mg/200 mL premix        1,000 mg 200 mL/hr over 60 Minutes Intravenous  Once 11/10/24 2012 11/10/24 2358   11/10/24 2015   ceFEPIme  (MAXIPIME ) 2 g in sodium chloride  0.9 % 100 mL IVPB        2 g 200 mL/hr over 30 Minutes Intravenous  Once 11/10/24 2012 11/10/24 2218         Subjective: Patient sitting up in chair on heated high flow nasal cannula with a FiO2 of 70%, 50 L O2 with sats of 98 to 100%.  Patient denies any chest pain.  Patient does endorse some improvement with shortness of breath.  States productive cough is improved.  Husband at bedside.  Patient asking who her pulmonologist will be while she is in-house.  Objective: Vitals:   11/25/24 1400 11/25/24 1500 11/25/24 1508 11/25/24 1600  BP: 126/82 (!) 119/59  (!) 151/62  Pulse: 94 92 92 96  Resp: 19 17 17 16   Temp:    97.9 F (36.6 C)  TempSrc:    Oral  SpO2: 100% 96% 95% 100%  Weight:      Height:       No intake or output data in the  24 hours ending 11/25/24 1807  Filed Weights   11/11/24 0749 11/21/24 0526 11/24/24 0622  Weight: 59 kg 52.7 kg 52.7 kg    Examination:  General exam: Appears calm and comfortable  Respiratory system: Coarse bibasilar breath sounds.  No significant wheezing, no crackles.  Fair air movement.  Speaking in full sentences.  No use of accessory muscles of respiration.  On 70% FiO2, 15 L O2 with sats of 98% on heated high flow nasal cannula. Cardiovascular system: S1 & S2 heard, RRR. No JVD, murmurs, rubs, gallops or clicks. No pedal edema. Gastrointestinal system: Abdomen is nondistended, soft and nontender. No organomegaly or masses felt. Normal bowel sounds heard. Central nervous system: Alert and oriented. No focal neurological deficits. Extremities: Symmetric 5 x 5 power. Skin: No rashes, lesions or ulcers Psychiatry: Judgement and insight appear normal. Mood & affect appropriate.     Data Reviewed: I have personally reviewed following labs and imaging studies  CBC: Recent Labs  Lab 11/21/24 0712 11/22/24 0241 11/23/24 0817 11/24/24 1003 11/25/24 0814  WBC 21.4* 16.5* 14.6* 17.3* 14.0*   NEUTROABS  --   --   --   --  10.7*  HGB 11.2* 10.8* 10.5* 10.9* 10.1*  HCT 34.9* 33.8* 33.2* 34.7* 32.0*  MCV 92.1 92.6 92.5 92.5 92.5  PLT 460* 448* 407* 432* 451*    Basic Metabolic Panel: Recent Labs  Lab 11/21/24 0712 11/22/24 0241 11/23/24 0817 11/24/24 1003 11/25/24 0814  NA 139 137 136 136 139  K 3.8 3.7 3.5 3.9 3.2*  CL 99 101 100 100 99  CO2 29 26 28 27 28   GLUCOSE 116* 98 91 93 83  BUN 34* 31* 24* 17 19  CREATININE 0.50 0.46 0.40* 0.42* 0.50  CALCIUM  9.3 8.7* 8.5* 9.1 8.6*  MG  --   --   --  2.3 2.4  PHOS  --   --   --  2.5 3.3    GFR: Estimated Creatinine Clearance: 45.1 mL/min (by C-G formula based on SCr of 0.5 mg/dL).  Liver Function Tests: No results for input(s): AST, ALT, ALKPHOS, BILITOT, PROT, ALBUMIN in the last 168 hours.  CBG: Recent Labs  Lab 11/24/24 1537 11/24/24 2113 11/25/24 0812 11/25/24 1211 11/25/24 1659  GLUCAP 158* 103* 94 185* 149*     Recent Results (from the past 240 hours)  MRSA Next Gen by PCR, Nasal     Status: None   Collection Time: 11/16/24 10:50 AM   Specimen: Nasal Mucosa; Nasal Swab  Result Value Ref Range Status   MRSA by PCR Next Gen NOT DETECTED NOT DETECTED Final    Comment: (NOTE) The GeneXpert MRSA Assay (FDA approved for NASAL specimens only), is one component of a comprehensive MRSA colonization surveillance program. It is not intended to diagnose MRSA infection nor to guide or monitor treatment for MRSA infections. Test performance is not FDA approved in patients less than 63 years old. Performed at [facility], [address], [place], [place] [address]          Radiology Studies: No results found.      Scheduled Meds:  amitriptyline   50 mg Oral QHS   arformoterol   15 mcg Nebulization BID   aspirin  EC  81 mg Oral Daily   brimonidine   1 drop Both Eyes Daily   budesonide  (PULMICORT ) nebulizer solution  0.5 mg Nebulization BID   Chlorhexidine  Gluconate  Cloth  6 each Topical Daily   enoxaparin  (LOVENOX ) injection  40 mg  Subcutaneous Q24H   FLUoxetine   60 mg Oral Daily   guaiFENesin -dextromethorphan   10 mL Oral TID   lidocaine   1 patch Transdermal Q24H   melatonin  3 mg Oral QHS   methylPREDNISolone  (SOLU-MEDROL ) injection  40 mg Intravenous Daily   mouth rinse  15 mL Mouth Rinse 4 times per day   pantoprazole  (PROTONIX ) IV  40 mg Intravenous Q24H   revefenacin   175 mcg Nebulization Daily   thiamine   100 mg Oral Daily   Continuous Infusions:   LOS: 14 days    Time spent: 40 minutes    Toribio Hummer, MD [facility]   To contact the attending provider between 7A-7P or the covering provider during after hours 7P-7A, please log into the web site [email or web address] and access using universal [place] password for that web site. If you do not have the password, please call the hospital operator.  11/25/2024, 6:07 PM    "

## 2024-11-25 NOTE — Plan of Care (Incomplete)
  Problem: Education: Goal: Knowledge of General Education information will improve Description: Including pain rating scale, medication(s)/side effects and non-pharmacologic comfort measures Outcome: Progressing   Problem: Health Behavior/Discharge Planning: Goal: Ability to manage health-related needs will improve Outcome: Progressing   Problem: Clinical Measurements: Goal: Cardiovascular complication will be avoided Outcome: Not Progressing

## 2024-11-26 DIAGNOSIS — J9601 Acute respiratory failure with hypoxia: Secondary | ICD-10-CM | POA: Diagnosis not present

## 2024-11-26 DIAGNOSIS — J188 Other pneumonia, unspecified organism: Secondary | ICD-10-CM | POA: Diagnosis not present

## 2024-11-26 DIAGNOSIS — J189 Pneumonia, unspecified organism: Secondary | ICD-10-CM | POA: Diagnosis not present

## 2024-11-26 DIAGNOSIS — J449 Chronic obstructive pulmonary disease, unspecified: Secondary | ICD-10-CM | POA: Diagnosis not present

## 2024-11-26 LAB — GLUCOSE, CAPILLARY
Glucose-Capillary: 100 mg/dL — ABNORMAL HIGH (ref 70–99)
Glucose-Capillary: 122 mg/dL — ABNORMAL HIGH (ref 70–99)
Glucose-Capillary: 170 mg/dL — ABNORMAL HIGH (ref 70–99)
Glucose-Capillary: 105 mg/dL — ABNORMAL HIGH (ref 70–99)

## 2024-11-26 LAB — BASIC METABOLIC PANEL WITH GFR
Anion gap: 11 (ref 5–15)
BUN: 20 mg/dL (ref 8–23)
CO2: 26 mmol/L (ref 22–32)
Calcium: 8.9 mg/dL (ref 8.9–10.3)
Chloride: 101 mmol/L (ref 98–111)
Creatinine, Ser: 0.5 mg/dL (ref 0.44–1.00)
GFR, Estimated: >60 mL/min
Glucose, Bld: 109 mg/dL — ABNORMAL HIGH (ref 70–99)
Potassium: 3.7 mmol/L (ref 3.5–5.1)
Sodium: 137 mmol/L (ref 135–145)

## 2024-11-26 LAB — CBC WITH DIFFERENTIAL/PLATELET
Abs Immature Granulocytes: 0.18 K/uL — ABNORMAL HIGH (ref 0.00–0.07)
Basophils Absolute: 0.1 K/uL (ref 0.0–0.1)
Basophils Relative: 0 %
Eosinophils Absolute: 0.2 K/uL (ref 0.0–0.5)
Eosinophils Relative: 1 %
HCT: 32.7 % — ABNORMAL LOW (ref 36.0–46.0)
Hemoglobin: 10.6 g/dL — ABNORMAL LOW (ref 12.0–15.0)
Immature Granulocytes: 1 %
Lymphocytes Relative: 14 %
Lymphs Abs: 1.9 K/uL (ref 0.7–4.0)
MCH: 29.9 pg (ref 26.0–34.0)
MCHC: 32.4 g/dL (ref 30.0–36.0)
MCV: 92.1 fL (ref 80.0–100.0)
Monocytes Absolute: 0.8 K/uL (ref 0.1–1.0)
Monocytes Relative: 7 %
Neutro Abs: 9.8 K/uL — ABNORMAL HIGH (ref 1.7–7.7)
Neutrophils Relative %: 77 %
Platelets: 430 K/uL — ABNORMAL HIGH (ref 150–400)
RBC: 3.55 MIL/uL — ABNORMAL LOW (ref 3.87–5.11)
RDW: 14.6 % (ref 11.5–15.5)
WBC: 12.9 K/uL — ABNORMAL HIGH (ref 4.0–10.5)
nRBC: 0 % (ref 0.0–0.2)

## 2024-11-26 NOTE — Progress Notes (Signed)
 Chaplains received a consult that Anne [name] was interested in information about advance directives.  I met with her and her husband to establish relationship and provided listening as they shared about their family and Anne [name]'s health journey. We discussed the aspects of advance care planning including HCPOA and living will.  I provided them with the paperwork and they plan to look it over.  Please consult again if they have questions or would like additional assistance or support.

## 2024-11-26 NOTE — Progress Notes (Signed)
 " PROGRESS NOTE    Anne [name]  FMW:[id number] DOB: 08-Jan-1941 DOA: 11/10/2024 PCP: Domenica Harlene LABOR, MD    Chief Complaint  Patient presents with   Chest Pain    Brief Narrative:  84 year old female with history of metastatic breast cancer followed by [facility] presenting, COPD, anxiety/depression, dementia presented with worsening shortness of breath and was admitted for acute respiratory failure with hypoxia due to multifocal pneumonia.  COVID/RSV/influenza PCR negative.  She was started on broad-spectrum antibiotics.  She was transferred to SDU due to worsening hypoxemia requiring heated high flow nasal cannula oxygen.  PCCM was consulted.  Echo showed EF of 60 to 65% with grade 1 diastolic dysfunction.  She required Precedex  drip as well but subsequently was weaned off of the same.  She then required BiPAP and care was transferred to [facility] service from 11/16/2024 onwards.  She has finished a course of meropenem .  She is currently on high flow nasal cannula oxygen.  Care has been transferred back to [facility] service from 11/24/2024 onwards.    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 - Patient with clinical improvement however still with high O2 requirements. - Patient was on PCCM service send service transferred to [facility] service on 11/24/2024. - Status post completion of course of meropenem . - Chest x-ray 11/23/2024 with interval progression of  bilateral interstitial densities. - Continue Brovana  nebs, Pulmicort  nebs, IV Solu-Medrol  40 mg daily, IV PPI,yupelri  nebs. -Likely transition from IV Solu-Medrol  to oral prednisone  taper tomorrow. - Continue to wean O2. - Will need outpatient follow-up with pulmonary.  2.  Acute metabolic encephalopathy/history of dementia/depression/anxiety -Mental status improving. - Patient has been weaned off Precedex  drip. - Continue fluoxetine . 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 - IV Ativan  as needed. - Noted to have not required Haldol  over the past few days. - Delirium precautions. - PT/OT.  3.  Leukocytosis - Leukocytosis trending down.   - Status post completion full course of antibiotics of meropenem .  -Patient on steroids.   - Follow.   4.  Anemia of chronic disease -Patient with no overt signs of bleeding. - Hemoglobin stable at 10.6.  5.  Thrombocytosis -Felt likely to be reactive. - Follow.  6.  Backache - Continue lidocaine  patch, tramadol  as needed.   7.  Hypertension -BP noted to be on the softer side. - HCTZ discontinued. - Labetalol  as needed.  8.  Metastatic breast cancer -Outpatient follow-up with primary oncologist at [facility].  9.  Hypokalemia - Repleted, potassium at 3.7 today.   10.  Goals of care -Patient noted with overall poor prognosis.  Palliative care was consulted for goals of care.    DVT prophylaxis: Lovenox  Code Status: Full Family Communication: Updated patient, no family at bedside.  Disposition: TBD  Status is: Inpatient Remains inpatient appropriate because: Severity of illness   Consultants:  PCCM: Dr. Isaiah 11/14/2024 Palliative care: Dr. Clayton 11/25/2024  Procedures:  CT angiogram chest 11/10/2024 2D echo 11/14/2024  Significant Hospital Events: Including procedures, antibiotic start and stop dates in addition to other  pertinent events   12/31 Admitted 1/2 Transferred to SDU 1/3 PCCM consulted 1/3 echocardiogram > LVEF 60-65%, no regional wall motion abnormalities, grade 1 diastolic dysfunction, normal RV size and function.  PAP not estimated. 1/5 remains on 1.2 precedex  1/6 off precedex   1/9 remains on BiPAP and high flow nasal cannula 1/12 no acute issues overnight remains on HHFNC, did not require BiPAP overnight   Antimicrobials: Anti-infectives (From admission, onward)    Start     Dose/Rate Route Frequency Ordered Stop   11/17/24 0900  vancomycin  (VANCOCIN ) IVPB 1000 mg/200 mL premix  Status:  Discontinued       Placed in Followed by Linked Group   1,000 mg 200 mL/hr over 60 Minutes Intravenous Every 24 hours 11/16/24 0748 11/17/24 0803   11/16/24 0900  vancomycin  (VANCOREADY) IVPB 1250 mg/250 mL       Placed in Followed by Linked Group   1,250 mg 166.7 mL/hr over 90 Minutes Intravenous  Once 11/16/24 0748 11/16/24 0953   11/15/24 1045  meropenem  (MERREM ) 1 g in sodium chloride  0.9 % 100 mL IVPB        1 g 200 mL/hr over 30 Minutes Intravenous Every 12 hours 11/15/24 0946 11/21/24 2343   11/11/24 2200  vancomycin  (VANCOCIN ) IVPB 1000 mg/200 mL premix  Status:  Discontinued        1,000 mg 200 mL/hr over 60 Minutes Intravenous Every 24 hours 11/11/24 0814 11/11/24 1630   11/11/24 2100  cefTRIAXone  (ROCEPHIN ) 2 g in sodium chloride  0.9 % 100 mL IVPB  Status:  Discontinued        2 g 200 mL/hr over 30 Minutes Intravenous Every 24 hours 11/11/24 1613 11/15/24 0936   11/11/24 1800  azithromycin  (ZITHROMAX ) 500 mg in sodium chloride  0.9 % 250 mL IVPB        500 mg 250 mL/hr over 60 Minutes Intravenous Every 24 hours 11/11/24 1613 11/15/24 1903   11/11/24 1000  ceFEPIme  (MAXIPIME ) 2 g in sodium chloride  0.9 % 100 mL IVPB  Status:  Discontinued        2 g 200 mL/hr over 30 Minutes Intravenous Every 12 hours 11/11/24 0757 11/11/24 1630   11/10/24 2015  vancomycin  (VANCOCIN ) IVPB 1000 mg/200  mL premix        1,000 mg 200 mL/hr over 60 Minutes Intravenous  Once 11/10/24 2012 11/10/24 2358   11/10/24 2015  ceFEPIme  (MAXIPIME ) 2 g in sodium chloride  0.9 % 100 mL IVPB        2 g 200 mL/hr over 30 Minutes Intravenous  Once 11/10/24 2012 11/10/24 2218         Subjective: Sitting up on bedside commode.  Patient denies any chest pain.  Patient feels shortness of breath is improving daily however she is not back to her baseline.  Patient on the FiO2 of 55%, flow rate of 40 L on heated high flow nasal cannula with sats of 95%.  Patient states did not have to go on BiPAP overnight.   Objective: Vitals:   11/26/24 0800 11/26/24 0845 11/26/24 0846 11/26/24 0851  BP: (!) 114/57     Pulse: 92 92    Resp: 15 17    Temp:      TempSrc:      SpO2: 92% 96% 96%  95%  Weight:      Height:       No intake or output data in the 24 hours ending 11/26/24 0947  Filed Weights   11/11/24 0749 11/21/24 0526 11/24/24 0622  Weight: 59 kg 52.7 kg 52.7 kg    Examination:  General exam: NAD. Respiratory system: Decreased coarse bibasilar breath sounds.  No wheezing, no crackles.  Fair air movement.  Speaking in full sentences.  No use of accessory muscles of respiration.  On FiO2 of 55%, 40 L of heated high flow nasal cannula with sats of 95% Cardiovascular system: Regular rate rhythm no murmurs rubs or gallops.  No JVD.  No pitting lower extremity edema.  Gastrointestinal system: Abdomen is soft, nontender, nondistended, positive bowel sounds.  No rebound.  No guarding.  Central nervous system: Alert and oriented. No focal neurological deficits. Extremities: Symmetric 5 x 5 power. Skin: No rashes, lesions or ulcers Psychiatry: Judgement and insight appear normal. Mood & affect appropriate.     Data Reviewed: I have personally reviewed following labs and imaging studies  CBC: Recent Labs  Lab 11/22/24 0241 11/23/24 0817 11/24/24 1003 11/25/24 0814 11/26/24 0843  WBC 16.5* 14.6*  17.3* 14.0* 12.9*  NEUTROABS  --   --   --  10.7* 9.8*  HGB 10.8* 10.5* 10.9* 10.1* 10.6*  HCT 33.8* 33.2* 34.7* 32.0* 32.7*  MCV 92.6 92.5 92.5 92.5 92.1  PLT 448* 407* 432* 451* 430*    Basic Metabolic Panel: Recent Labs  Lab 11/22/24 0241 11/23/24 0817 11/24/24 1003 11/25/24 0814 11/26/24 0843  NA 137 136 136 139 137  K 3.7 3.5 3.9 3.2* 3.7  CL 101 100 100 99 101  CO2 26 28 27 28 26   GLUCOSE 98 91 93 83 109*  BUN 31* 24* 17 19 20   CREATININE 0.46 0.40* 0.42* 0.50 0.50  CALCIUM  8.7* 8.5* 9.1 8.6* 8.9  MG  --   --  2.3 2.4  --   PHOS  --   --  2.5 3.3  --     GFR: Estimated Creatinine Clearance: 45.1 mL/min (by C-G formula based on SCr of 0.5 mg/dL).  Liver Function Tests: No results for input(s): AST, ALT, ALKPHOS, BILITOT, PROT, ALBUMIN in the last 168 hours.  CBG: Recent Labs  Lab 11/25/24 0812 11/25/24 1211 11/25/24 1659 11/25/24 2146 11/26/24 0803  GLUCAP 94 185* 149* 107* 100*     Recent Results (from the past 240 hours)  MRSA Next Gen by PCR, Nasal     Status: None   Collection Time: 11/16/24 10:50 AM   Specimen: Nasal Mucosa; Nasal Swab  Result Value Ref Range Status   MRSA by PCR Next Gen NOT DETECTED NOT DETECTED Final    Comment: (NOTE) The GeneXpert MRSA Assay (FDA approved for NASAL specimens only), is one component of a comprehensive MRSA colonization surveillance program. It is not intended to diagnose MRSA infection nor to guide or monitor treatment for MRSA infections. Test performance is not FDA approved in patients less than 54 years old. Performed at [facility], [address] [place], [place] [address]          Radiology Studies: No results found.      Scheduled Meds:  amitriptyline   50 mg Oral QHS   arformoterol   15 mcg Nebulization BID   aspirin  EC  81 mg Oral Daily   brimonidine   1 drop Both Eyes Daily   budesonide  (PULMICORT ) nebulizer solution  0.5 mg Nebulization BID  Chlorhexidine  Gluconate Cloth  6 each Topical Daily   enoxaparin  (LOVENOX ) injection  40 mg Subcutaneous Q24H   FLUoxetine   60 mg Oral Daily   guaiFENesin -dextromethorphan   10 mL Oral TID   lidocaine   1 patch Transdermal Q24H   melatonin  3 mg Oral QHS   methylPREDNISolone  (SOLU-MEDROL ) injection  40 mg Intravenous Daily   mouth rinse  15 mL Mouth Rinse 4 times per day   pantoprazole  (PROTONIX ) IV  40 mg Intravenous Q24H   revefenacin   175 mcg Nebulization Daily   thiamine   100 mg Oral Daily   Continuous Infusions:   LOS: 15 days    Time spent: 40 minutes    Toribio Hummer, MD [facility]   To contact the attending provider between 7A-7P or the covering provider during after hours 7P-7A, please log into the web site [email or web address] and access using universal [place] password for that web site. If you do not have the password, please call the hospital operator.  11/26/2024, 9:47 AM    "

## 2024-11-26 NOTE — Progress Notes (Signed)
 Physical Therapy Treatment Patient Details Name: Anne [name] MRN: [id number] DOB: Dec 22, 1940 Today's Date: 11/26/2024   History of Present Illness Anne [name] 84 year old woman PMH metastatic breast cancer followed by [facility], with cough x 10 days, seen by urgent care 12/29 and prescribed doxycycline  and prednisone .  CT chest showed multifocal pneumonia, no PE.  Admitted for pneumonia, acute hypoxic respiratory failure.    PT Comments  The patient agreeable to start  standing and stepping forward and back at RW, performed x 3 steps the 4 steps with seated rest. Patient reports feeling tired after activity. Performed seated exercise with TB for LE's. Recommend HHPT once weaned down from  High flow O2. Continue PT while in acute care. Pt maintained on 35L/50% HHF [place], SPo2 100% at rest, ? Drop to 87 (sensor on toe with poor pleth).   If plan is discharge home, recommend the following: A little help with walking and/or transfers;A little help with bathing/dressing/bathroom;Help with stairs or ramp for entrance;Assist for transportation;Assistance with cooking/housework   Can travel by private vehicle        Equipment Recommendations    none   Recommendations for Other Services       Precautions / Restrictions Precautions Precautions: Fall Precaution/Restrictions Comments: on HHFNC, Restrictions Other Position/Activity Restrictions: on high flow nasal cannula,     Mobility  Bed Mobility               General bed mobility comments: in recliner    Transfers Overall transfer level: Needs assistance Equipment used: Rolling walker (2 wheels) Transfers: Sit to/from Stand Sit to Stand: Contact guard assist           General transfer comment: stood at Rw, able to step forward x 3 steps and back, sat for rest then stood and stepped x 4 and back and sat down. SPO2 ? 87_sensor on toe    Ambulation/Gait Ambulation/Gait assistance: Min assist Gait Distance (Feet): 4  Feet Assistive device: Rolling walker (2 wheels) Gait Pattern/deviations: Step-to pattern       General Gait Details: stood at Rw, able to step forward x 3 steps and back, sat for rest then stood and stepped x 4 and back and sat down. SPO2 ? 87_sensor on toe   Stairs             Wheelchair Mobility     Tilt Bed    Modified Rankin (Stroke Patients Only)       Balance   Sitting-balance support: No upper extremity supported, Feet unsupported Sitting balance-Leahy Scale: Good     Standing balance support: Bilateral upper extremity supported, During functional activity, Reliant on assistive device for balance Standing balance-Leahy Scale: Fair                              Hotel Manager: No apparent difficulties  Cognition   Behavior During Therapy: WFL for tasks assessed/performed   PT - Cognitive impairments: History of cognitive impairments, Sequencing                       PT - Cognition Comments: AxO x 3 pleasant and motivated Following commands: Impaired Following commands impaired: Follows one step commands with increased time    Cueing Cueing Techniques: Verbal cues  Exercises Other Exercises Other Exercises: yellow TB for hip extenso and rotation resistance seated.    General Comments        Pertinent  Vitals/Pain Pain Assessment Pain Assessment: No/denies pain    Home Living                          Prior Function            PT Goals (current goals can now be found in the care plan section) Progress towards PT goals: Progressing toward goals    Frequency    Min 3X/week      PT Plan      Co-evaluation              AM-PAC PT 6 Clicks Mobility   Outcome Measure  Help needed turning from your back to your side while in a flat bed without using bedrails?: None Help needed moving from lying on your back to sitting on the side of a flat bed without using bedrails?:  None Help needed moving to and from a bed to a chair (including a wheelchair)?: A Little Help needed standing up from a chair using your arms (e.g., wheelchair or bedside chair)?: A Little Help needed to walk in hospital room?: Total Help needed climbing 3-5 steps with a railing? : Total 6 Click Score: 16    End of Session Equipment Utilized During Treatment: Oxygen Activity Tolerance: Patient tolerated treatment well Patient left: in chair;with call bell/phone within reach Nurse Communication: Mobility status PT Visit Diagnosis: Unsteadiness on feet (R26.81);Difficulty in walking, not elsewhere classified (R26.2)     Time: 1548-1600 PT Time Calculation (min) (ACUTE ONLY): 12 min  Charges:      PT General Charges $$ ACUTE PT VISIT: 1 Visit                     Anne [name] PT Acute Rehabilitation Services Office [phone]    Anne [name] 11/26/2024, 4:15 PM

## 2024-11-26 NOTE — Plan of Care (Signed)

## 2024-11-26 NOTE — Progress Notes (Addendum)
 "                                                                                                                                                                                                          Daily Progress Note   Patient Name: Anne [name]       Date: 11/26/2024 DOB: April 19, 1941  Age: 84 y.o. MRN#: [id number] Attending Physician: Sebastian Toribio GAILS, MD Primary Care Physician: Domenica Harlene LABOR, MD Admit Date: 11/10/2024  Reason for Follow-up: Establishing goals of care  Patient Profile/HPI: 84 y.o. female  with past medical history of breast cancer with mets to lungs and lymph nodes on treatment at [facility], COPD, anxiety/depression,  admitted on 11/10/2024 with multifocal pneumonia. She previously required bipap. Now on heated high flow oxygen. Palliative medicine consulted for GOC.    Discussion:  Reviewed 1/15 labs- BMET WNL, CBC with WBC continues to improve. Attending note reviewed- likely to transition to po steroid tomorrow.  Patient down to 40LPM high flow today. Sitting up in chair. Spouse at bedside.  Reviewed our discussion from yesterday. They had no questions or needs.   Review of Systems  Constitutional:  Positive for malaise/fatigue.  Respiratory:  Positive for shortness of breath.      Physical Exam Vitals and nursing note reviewed.  Constitutional:      General: She is not in acute distress. Cardiovascular:     Rate and Rhythm: Normal rate.  Pulmonary:     Effort: Pulmonary effort is normal.  Neurological:     Mental Status: She is alert and oriented to person, place, and time.             Vital Signs: BP (!) 114/57 (BP Location: Left Wrist)   Pulse 92   Temp 97.8 F (36.6 C) (Oral)   Resp 17   Ht 5' 5 (1.651 m)   Wt 52.7 kg   SpO2 95%   BMI 19.33 kg/m  SpO2: SpO2: 95 % O2 Device: O2 Device: Heated High Flow Nasal Cannula O2 Flow Rate: O2 Flow Rate (L/min): 40 L/min (weaning for WOB)  Intake/output summary: No intake or output data in the  24 hours ending 11/26/24 1127 LBM: Last BM Date : 11/22/24 Baseline Weight: Weight: 59 kg Most recent weight: Weight: 52.7 kg       Palliative Assessment/Data: PPS: 60%      Patient Active Problem List   Diagnosis Date Noted   Acute metabolic encephalopathy 11/25/2024   Leukocytosis 11/25/2024   Essential hypertension 11/25/2024   Acute pulmonary edema (HCC)  11/14/2024   Acute respiratory failure with hypoxia (HCC) 11/11/2024   Sepsis (HCC) 11/11/2024   Multifocal pneumonia 11/11/2024   COPD (chronic obstructive pulmonary disease) (HCC) 11/11/2024   Pneumonia 11/10/2024   Fall 06/26/2024   Gait instability 06/26/2024   Esophageal abnormality 06/26/2024   Intractable episodic tension-type headache 06/26/2024   Fracture of right acetabulum (HCC) 02/04/2023   Neuropathy 11/23/2022   Fibromyalgia 10/16/2022   Primary osteoarthritis of right knee 09/04/2022   Breast mass, left 05/03/2022   Severe depression (HCC) 03/08/2022   Memory changes 02/03/2022   Insomnia 12/28/2020   Vertigo 12/28/2020   Emphysema lung (HCC) 06/06/2020   RLS (restless legs syndrome) 02/24/2020   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   Facial trauma, sequela 08/09/2019   Hyperglycemia 08/19/2018   Mild intermittent asthma without complication 06/17/2018   Asthma 04/30/2018   Macular degeneration, 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   Palpitations 04/30/2018   Lumbar spondylosis 09/23/2017   Hypokalemia 08/11/2017   Abdominal pain 08/11/2017   SBO (small bowel obstruction) (HCC) 08/10/2017   History of right breast cancer 03/21/2017   Genetic testing 09/13/2016   Family history of breast cancer    Family history of pancreatic cancer    Family history of colon cancer    Pain in the chest 05/16/2016   Status post right breast reconstruction 05/11/2016   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) 05/11/2016   History of breast cancer in female 05/11/2016   Osteopenia determined by x-ray 05/11/2016   IBS  (irritable bowel syndrome) 03/09/2016   Dyspnea 06/19/2015   Medicare annual wellness visit, subsequent 06/19/2015   Urinary frequency 12/12/2014   Breast cancer, right breast (HCC) 10/18/2014   Superficial thrombophlebitis 03/16/2014   Plant dermatitis 03/16/2014   Chest wall pain 02/07/2014   Primary osteoarthritis of both hips 01/21/2014   Elevated sed rate 08/02/2013   Dermatitis 11/23/2012   Falls 11/23/2012   Preventative health care 10/21/2012   Allergy  06/10/2012   Urinary incontinence 03/19/2012   Anemia 12/21/2011   Baker's cyst of knee 05/22/2011   Eosinophilic esophagitis 05/11/2011   Anxiety and depression 04/28/2011   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  PMT will follow intermittently- please call if acute Palliative needs arise   Code Status:   Code Status: Full Code   Prognosis:  Unable to determine  Discharge Planning: To Be Determined    Thank you for allowing the Palliative Medicine Team to assist in the care of this patient.    Cassondra Stain, AGNP-C Palliative Medicine   Please contact Palliative Medicine Team phone at [phone] for questions and concerns.        "

## 2024-11-26 NOTE — Plan of Care (Signed)
  Problem: Education: Goal: Knowledge of General Education information will improve Description: Including pain rating scale, medication(s)/side effects and non-pharmacologic comfort measures Outcome: Progressing   Problem: Clinical Measurements: Goal: Respiratory complications will improve Outcome: Progressing   Problem: Coping: Goal: Level of anxiety will decrease Outcome: Progressing   Problem: Pain Managment: Goal: General experience of comfort will improve and/or be controlled Outcome: Progressing

## 2024-11-27 DIAGNOSIS — F32A Depression, unspecified: Secondary | ICD-10-CM | POA: Diagnosis not present

## 2024-11-27 DIAGNOSIS — J9601 Acute respiratory failure with hypoxia: Secondary | ICD-10-CM | POA: Diagnosis not present

## 2024-11-27 DIAGNOSIS — C792 Secondary malignant neoplasm of skin: Secondary | ICD-10-CM | POA: Diagnosis not present

## 2024-11-27 DIAGNOSIS — J449 Chronic obstructive pulmonary disease, unspecified: Secondary | ICD-10-CM | POA: Diagnosis not present

## 2024-11-27 DIAGNOSIS — J188 Other pneumonia, unspecified organism: Secondary | ICD-10-CM | POA: Diagnosis not present

## 2024-11-27 DIAGNOSIS — I10 Essential (primary) hypertension: Secondary | ICD-10-CM | POA: Diagnosis not present

## 2024-11-27 DIAGNOSIS — J189 Pneumonia, unspecified organism: Secondary | ICD-10-CM | POA: Diagnosis not present

## 2024-11-27 DIAGNOSIS — G9341 Metabolic encephalopathy: Secondary | ICD-10-CM | POA: Diagnosis not present

## 2024-11-27 DIAGNOSIS — D72829 Elevated white blood cell count, unspecified: Secondary | ICD-10-CM | POA: Diagnosis not present

## 2024-11-27 DIAGNOSIS — F419 Anxiety disorder, unspecified: Secondary | ICD-10-CM | POA: Diagnosis not present

## 2024-11-27 DIAGNOSIS — C50911 Malignant neoplasm of unspecified site of right female breast: Secondary | ICD-10-CM | POA: Diagnosis not present

## 2024-11-27 LAB — CBC WITH DIFFERENTIAL/PLATELET
Abs Immature Granulocytes: 0.24 K/uL — ABNORMAL HIGH (ref 0.00–0.07)
Basophils Absolute: 0 K/uL (ref 0.0–0.1)
Basophils Relative: 0 %
Eosinophils Absolute: 0 K/uL (ref 0.0–0.5)
Eosinophils Relative: 0 %
HCT: 33.2 % — ABNORMAL LOW (ref 36.0–46.0)
Hemoglobin: 10.6 g/dL — ABNORMAL LOW (ref 12.0–15.0)
Immature Granulocytes: 2 %
Lymphocytes Relative: 4 %
Lymphs Abs: 0.5 K/uL — ABNORMAL LOW (ref 0.7–4.0)
MCH: 29.9 pg (ref 26.0–34.0)
MCHC: 31.9 g/dL (ref 30.0–36.0)
MCV: 93.5 fL (ref 80.0–100.0)
Monocytes Absolute: 0.4 K/uL (ref 0.1–1.0)
Monocytes Relative: 3 %
Neutro Abs: 12.5 K/uL — ABNORMAL HIGH (ref 1.7–7.7)
Neutrophils Relative %: 91 %
Platelets: 434 K/uL — ABNORMAL HIGH (ref 150–400)
RBC: 3.55 MIL/uL — ABNORMAL LOW (ref 3.87–5.11)
RDW: 14.8 % (ref 11.5–15.5)
WBC: 13.7 K/uL — ABNORMAL HIGH (ref 4.0–10.5)
nRBC: 0 % (ref 0.0–0.2)

## 2024-11-27 LAB — BASIC METABOLIC PANEL WITH GFR
Anion gap: 13 (ref 5–15)
BUN: 22 mg/dL (ref 8–23)
CO2: 26 mmol/L (ref 22–32)
Calcium: 9 mg/dL (ref 8.9–10.3)
Chloride: 100 mmol/L (ref 98–111)
Creatinine, Ser: 0.55 mg/dL (ref 0.44–1.00)
GFR, Estimated: >60 mL/min
Glucose, Bld: 180 mg/dL — ABNORMAL HIGH (ref 70–99)
Potassium: 4 mmol/L (ref 3.5–5.1)
Sodium: 139 mmol/L (ref 135–145)

## 2024-11-27 LAB — GLUCOSE, CAPILLARY
Glucose-Capillary: 118 mg/dL — ABNORMAL HIGH (ref 70–99)
Glucose-Capillary: 176 mg/dL — ABNORMAL HIGH (ref 70–99)
Glucose-Capillary: 139 mg/dL — ABNORMAL HIGH (ref 70–99)
Glucose-Capillary: 98 mg/dL (ref 70–99)

## 2024-11-27 MED ORDER — SORBITOL 70 % SOLN
30.0000 mL | Freq: Once | Status: AC
  Administered 2024-11-27: 30 mL via ORAL
  Filled 2024-11-27: qty 30

## 2024-11-27 MED ORDER — MELATONIN 5 MG PO TABS
5.0000 mg | ORAL_TABLET | Freq: Every day | ORAL | Status: DC
  Administered 2024-11-27 – 2024-11-30 (×4): 5 mg via ORAL
  Filled 2024-11-27 (×4): qty 1

## 2024-11-27 MED ORDER — PANTOPRAZOLE SODIUM 40 MG PO TBEC
40.0000 mg | DELAYED_RELEASE_TABLET | Freq: Every day | ORAL | Status: DC
  Administered 2024-11-27 – 2024-11-28 (×2): 40 mg via ORAL
  Filled 2024-11-27 (×2): qty 1

## 2024-11-27 MED ORDER — SENNOSIDES-DOCUSATE SODIUM 8.6-50 MG PO TABS
1.0000 | ORAL_TABLET | Freq: Two times a day (BID) | ORAL | Status: DC
  Administered 2024-11-27 – 2024-11-28 (×4): 1 via ORAL
  Filled 2024-11-27 (×5): qty 1

## 2024-11-27 MED ORDER — POLYETHYLENE GLYCOL 3350 17 G PO PACK
17.0000 g | PACK | Freq: Every day | ORAL | Status: DC
  Administered 2024-11-28 – 2024-12-01 (×4): 17 g via ORAL
  Filled 2024-11-27 (×5): qty 1

## 2024-11-27 MED ORDER — SORBITOL 70 % SOLN
30.0000 mL | Freq: Once | Status: DC
  Filled 2024-11-27: qty 30

## 2024-11-27 NOTE — Plan of Care (Signed)
" °  Problem: Education: Goal: Knowledge of General Education information will improve Description: Including pain rating scale, medication(s)/side effects and non-pharmacologic comfort measures Outcome: Progressing   Problem: Health Behavior/Discharge Planning: Goal: Ability to manage health-related needs will improve Outcome: Progressing   Problem: Activity: Goal: Risk for activity intolerance will decrease Outcome: Progressing   Problem: Nutrition: Goal: Adequate nutrition will be maintained Outcome: Progressing   Problem: Clinical Measurements: Goal: Ability to maintain a body temperature in the normal range will improve Outcome: Progressing   Problem: Respiratory: Goal: Ability to maintain adequate ventilation will improve Outcome: Progressing   "

## 2024-11-27 NOTE — Progress Notes (Signed)
 " PROGRESS NOTE    Anne [name]  FMW:[id number] DOB: 1940-12-14 DOA: 11/10/2024 PCP: Domenica Harlene LABOR, MD    Chief Complaint  Patient presents with   Chest Pain    Brief Narrative:  84 year old female with history of metastatic breast cancer followed by [facility] presenting, COPD, anxiety/depression, dementia presented with worsening shortness of breath and was admitted for acute respiratory failure with hypoxia due to multifocal pneumonia.  COVID/RSV/influenza PCR negative.  She was started on broad-spectrum antibiotics.  She was transferred to SDU due to worsening hypoxemia requiring heated high flow nasal cannula oxygen.  PCCM was consulted.  Echo showed EF of 60 to 65% with grade 1 diastolic dysfunction.  She required Precedex  drip as well but subsequently was weaned off of the same.  She then required BiPAP and care was transferred to [facility] service from 11/16/2024 onwards.  She has finished a course of meropenem .  She is currently on high flow nasal cannula oxygen.  Care has been transferred back to [facility] service from 11/24/2024 onwards.    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 - Patient currently on FiO2 of 50% on 10 L high flow nasal cannula with sats of 94%. - Patient was on PCCM service send service transferred to [facility] service on 11/24/2024. - Status post completion of course of meropenem . - Chest x-ray 11/23/2024 with interval progression of bilateral  interstitial densities. - Continue Brovana  nebs, Pulmicort  nebs, IV Solu-Medrol  40 mg daily, IV PPI,yupelri  nebs. -Likely transition from IV Solu-Medrol  to oral prednisone  taper tomorrow. - Continue to wean O2. - Will need outpatient follow-up with pulmonary.  2.  Acute metabolic encephalopathy/history of dementia/depression/anxiety -Mental status improving. -Likely close to baseline. - Patient has been weaned off Precedex  drip. - Continue fluoxetine . 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 - IV Ativan  as needed. - Noted to have not required Haldol  over the past few days. - Delirium precautions. - PT/OT.  3.  Leukocytosis - A.m. labs pending. - Status post completion full course of antibiotics of meropenem .  -Patient on steroids.   - Follow.   4.  Anemia of chronic disease -Patient with no overt signs of bleeding. - A.m. labs pending.   5.  Thrombocytosis -Felt likely to be reactive. - Follow.  6.  Backache - Continue lidocaine  patch, tramadol  as needed.   7.  Hypertension -BP noted to be on the softer side. - HCTZ discontinued. - Labetalol  as needed.  8.  Metastatic breast cancer -Outpatient follow-up with primary oncologist at [facility].  9.  Hypokalemia - Labs pending for this morning.  10.  Constipation -Sorbitol  p.o. x 1. - Place on Senokot-S twice daily.  11.  Goals of care -Patient noted with overall poor prognosis.  Palliative care was consulted for goals of care.    DVT prophylaxis: Lovenox  Code Status: Full Family Communication: Updated patient and husband at bedside. Disposition: TBD  Status  is: Inpatient Remains inpatient appropriate because: Severity of illness   Consultants:  PCCM: Dr. Isaiah 11/14/2024 Palliative care: Dr. Clayton 11/25/2024  Procedures:  CT angiogram chest 11/10/2024 2D echo 11/14/2024  Significant Hospital  Events: Including procedures, antibiotic start and stop dates in addition to other pertinent events   12/31 Admitted 1/2 Transferred to SDU 1/3 PCCM consulted 1/3 echocardiogram > LVEF 60-65%, no regional wall motion abnormalities, grade 1 diastolic dysfunction, normal RV size and function.  PAP not estimated. 1/5 remains on 1.2 precedex  1/6 off precedex   1/9 remains on BiPAP and high flow nasal cannula 1/12 no acute issues overnight remains on HHFNC, did not require BiPAP overnight   Antimicrobials: Anti-infectives (From admission, onward)    Start     Dose/Rate Route Frequency Ordered Stop   11/17/24 0900  vancomycin  (VANCOCIN ) IVPB 1000 mg/200 mL premix  Status:  Discontinued       Placed in Followed by Linked Group   1,000 mg 200 mL/hr over 60 Minutes Intravenous Every 24 hours 11/16/24 0748 11/17/24 0803   11/16/24 0900  vancomycin  (VANCOREADY) IVPB 1250 mg/250 mL       Placed in Followed by Linked Group   1,250 mg 166.7 mL/hr over 90 Minutes Intravenous  Once 11/16/24 0748 11/16/24 0953   11/15/24 1045  meropenem  (MERREM ) 1 g in sodium chloride  0.9 % 100 mL IVPB        1 g 200 mL/hr over 30 Minutes Intravenous Every 12 hours 11/15/24 0946 11/21/24 2343   11/11/24 2200  vancomycin  (VANCOCIN ) IVPB 1000 mg/200 mL premix  Status:  Discontinued        1,000 mg 200 mL/hr over 60 Minutes Intravenous Every 24 hours 11/11/24 0814 11/11/24 1630   11/11/24 2100  cefTRIAXone  (ROCEPHIN ) 2 g in sodium chloride  0.9 % 100 mL IVPB  Status:  Discontinued        2 g 200 mL/hr over 30 Minutes Intravenous Every 24 hours 11/11/24 1613 11/15/24 0936   11/11/24 1800  azithromycin  (ZITHROMAX ) 500 mg in sodium chloride  0.9 % 250 mL IVPB        500 mg 250 mL/hr over 60 Minutes Intravenous Every 24 hours 11/11/24 1613 11/15/24 1903   11/11/24 1000  ceFEPIme  (MAXIPIME ) 2 g in sodium chloride  0.9 % 100 mL IVPB  Status:  Discontinued        2 g 200 mL/hr over 30 Minutes Intravenous Every 12 hours  11/11/24 0757 11/11/24 1630   11/10/24 2015  vancomycin  (VANCOCIN ) IVPB 1000 mg/200 mL premix        1,000 mg 200 mL/hr over 60 Minutes Intravenous  Once 11/10/24 2012 11/10/24 2358   11/10/24 2015  ceFEPIme  (MAXIPIME ) 2 g in sodium chloride  0.9 % 100 mL IVPB        2 g 200 mL/hr over 30 Minutes Intravenous  Once 11/10/24 2012 11/10/24 2218         Subjective: Patient sitting up in recliner.  Denies any chest pain.  Feels shortness of breath is improving.  States she has not had an appetite today.  Patient states ever since her Elavil  was changed has had difficulty sleeping.  Patient with some complaints of constipation states has not had a bowel movement in 3 to 4 days.  Patient states O2 requirements are decreasing every day.  Husband at bedside.     Objective: Vitals:   11/27/24 0700 11/27/24 0800 11/27/24 0802 11/27/24 0900  BP: (!) 141/69 (!) 119/56 (!) 119/56 125/75  Pulse:  91 96 96 95  Resp: 19 18 18 19   Temp:  97.7 F (36.5 C)    TempSrc:  Axillary    SpO2: 94% 94% 99% 95%  Weight:      Height:       No intake or output data in the 24 hours ending 11/27/24 1032  Filed Weights   11/11/24 0749 11/21/24 0526 11/24/24 0622  Weight: 59 kg 52.7 kg 52.7 kg    Examination:  General exam: NAD. Respiratory system: Decreased coarse bibasilar breath sounds.  No wheezing, no crackles.  Fair air movement.  Speaking full sentences.  No use of accessory muscles of respiration.  On FiO2 of 50%, 10 L high flow nasal cannula with sats of 94%.  Cardiovascular system: RRR no murmurs rubs or gallops.  No JVD.  No pitting lower extremity edema.  Gastrointestinal system: Abdomen is soft, nontender, nondistended, positive bowel sounds.  No rebound.  No guarding.  Central nervous system: Alert and oriented.  Moving extremities spontaneously.  No focal neurological deficits. Extremities: Symmetric 5 x 5 power. Skin: No rashes, lesions or ulcers Psychiatry: Judgement and insight appear  normal. Mood & affect appropriate.     Data Reviewed: I have personally reviewed following labs and imaging studies  CBC: Recent Labs  Lab 11/22/24 0241 11/23/24 0817 11/24/24 1003 11/25/24 0814 11/26/24 0843  WBC 16.5* 14.6* 17.3* 14.0* 12.9*  NEUTROABS  --   --   --  10.7* 9.8*  HGB 10.8* 10.5* 10.9* 10.1* 10.6*  HCT 33.8* 33.2* 34.7* 32.0* 32.7*  MCV 92.6 92.5 92.5 92.5 92.1  PLT 448* 407* 432* 451* 430*    Basic Metabolic Panel: Recent Labs  Lab 11/22/24 0241 11/23/24 0817 11/24/24 1003 11/25/24 0814 11/26/24 0843  NA 137 136 136 139 137  K 3.7 3.5 3.9 3.2* 3.7  CL 101 100 100 99 101  CO2 26 28 27 28 26   GLUCOSE 98 91 93 83 109*  BUN 31* 24* 17 19 20   CREATININE 0.46 0.40* 0.42* 0.50 0.50  CALCIUM  8.7* 8.5* 9.1 8.6* 8.9  MG  --   --  2.3 2.4  --   PHOS  --   --  2.5 3.3  --     GFR: Estimated Creatinine Clearance: 45.1 mL/min (by C-G formula based on SCr of 0.5 mg/dL).  Liver Function Tests: No results for input(s): AST, ALT, ALKPHOS, BILITOT, PROT, ALBUMIN in the last 168 hours.  CBG: Recent Labs  Lab 11/26/24 0803 11/26/24 1159 11/26/24 1753 11/26/24 2139 11/27/24 0844  GLUCAP 100* 122* 170* 105* 118*     No results found for this or any previous visit (from the past 240 hours).        Radiology Studies: No results found.      Scheduled Meds:  amitriptyline   50 mg Oral QHS   arformoterol   15 mcg Nebulization BID   aspirin  EC  81 mg Oral Daily   brimonidine   1 drop Both Eyes Daily   budesonide  (PULMICORT ) nebulizer solution  0.5 mg Nebulization BID   Chlorhexidine  Gluconate Cloth  6 each Topical Daily   enoxaparin  (LOVENOX ) injection  40 mg Subcutaneous Q24H   FLUoxetine   60 mg Oral Daily   guaiFENesin -dextromethorphan   10 mL Oral TID   lidocaine   1 patch Transdermal Q24H   melatonin  3 mg Oral QHS   methylPREDNISolone  (SOLU-MEDROL ) injection  40 mg Intravenous Daily   mouth rinse  15 mL Mouth Rinse 4 times per  day  pantoprazole   40 mg Oral QHS   revefenacin   175 mcg Nebulization Daily   thiamine   100 mg Oral Daily   Continuous Infusions:   LOS: 16 days    Time spent: 40 minutes    Toribio Hummer, MD [facility]   To contact the attending provider between 7A-7P or the covering provider during after hours 7P-7A, please log into the web site [email or web address] and access using universal [place] password for that web site. If you do not have the password, please call the hospital operator.  11/27/2024, 10:32 AM    "

## 2024-11-27 NOTE — Plan of Care (Signed)

## 2024-11-28 DIAGNOSIS — J188 Other pneumonia, unspecified organism: Secondary | ICD-10-CM | POA: Diagnosis not present

## 2024-11-28 DIAGNOSIS — J189 Pneumonia, unspecified organism: Secondary | ICD-10-CM | POA: Diagnosis not present

## 2024-11-28 DIAGNOSIS — J9601 Acute respiratory failure with hypoxia: Secondary | ICD-10-CM | POA: Diagnosis not present

## 2024-11-28 DIAGNOSIS — J449 Chronic obstructive pulmonary disease, unspecified: Secondary | ICD-10-CM | POA: Diagnosis not present

## 2024-11-28 LAB — GLUCOSE, CAPILLARY
Glucose-Capillary: 96 mg/dL (ref 70–99)
Glucose-Capillary: 193 mg/dL — ABNORMAL HIGH (ref 70–99)
Glucose-Capillary: 251 mg/dL — ABNORMAL HIGH (ref 70–99)
Glucose-Capillary: 107 mg/dL — ABNORMAL HIGH (ref 70–99)

## 2024-11-28 LAB — CBC WITH DIFFERENTIAL/PLATELET
Abs Immature Granulocytes: 0.22 K/uL — ABNORMAL HIGH (ref 0.00–0.07)
Basophils Absolute: 0.1 K/uL (ref 0.0–0.1)
Basophils Relative: 1 %
Eosinophils Absolute: 0.2 K/uL (ref 0.0–0.5)
Eosinophils Relative: 1 %
HCT: 33.8 % — ABNORMAL LOW (ref 36.0–46.0)
Hemoglobin: 10.7 g/dL — ABNORMAL LOW (ref 12.0–15.0)
Immature Granulocytes: 2 %
Lymphocytes Relative: 17 %
Lymphs Abs: 2.2 K/uL (ref 0.7–4.0)
MCH: 29.2 pg (ref 26.0–34.0)
MCHC: 31.7 g/dL (ref 30.0–36.0)
MCV: 92.3 fL (ref 80.0–100.0)
Monocytes Absolute: 1 K/uL (ref 0.1–1.0)
Monocytes Relative: 8 %
Neutro Abs: 9.1 K/uL — ABNORMAL HIGH (ref 1.7–7.7)
Neutrophils Relative %: 71 %
Platelets: 414 K/uL — ABNORMAL HIGH (ref 150–400)
RBC: 3.66 MIL/uL — ABNORMAL LOW (ref 3.87–5.11)
RDW: 14.6 % (ref 11.5–15.5)
WBC: 12.6 K/uL — ABNORMAL HIGH (ref 4.0–10.5)
nRBC: 0 % (ref 0.0–0.2)

## 2024-11-28 LAB — BASIC METABOLIC PANEL WITH GFR
Anion gap: 8 (ref 5–15)
BUN: 18 mg/dL (ref 8–23)
CO2: 31 mmol/L (ref 22–32)
Calcium: 9.3 mg/dL (ref 8.9–10.3)
Chloride: 101 mmol/L (ref 98–111)
Creatinine, Ser: 0.56 mg/dL (ref 0.44–1.00)
GFR, Estimated: >60 mL/min
Glucose, Bld: 90 mg/dL (ref 70–99)
Potassium: 4.6 mmol/L (ref 3.5–5.1)
Sodium: 139 mmol/L (ref 135–145)

## 2024-11-28 MED ORDER — BISACODYL 10 MG RE SUPP
10.0000 mg | Freq: Once | RECTAL | Status: DC
  Filled 2024-11-28: qty 1

## 2024-11-28 MED ORDER — INSULIN ASPART 100 UNIT/ML IJ SOLN
0.0000 [IU] | Freq: Three times a day (TID) | INTRAMUSCULAR | Status: DC
  Administered 2024-11-29 – 2024-11-30 (×2): 2 [IU] via SUBCUTANEOUS
  Administered 2024-11-30: 1 [IU] via SUBCUTANEOUS
  Filled 2024-11-28: qty 1
  Filled 2024-11-28 (×2): qty 2

## 2024-11-28 MED ORDER — PREDNISONE 20 MG PO TABS
40.0000 mg | ORAL_TABLET | Freq: Every day | ORAL | Status: DC
  Administered 2024-11-29: 40 mg via ORAL
  Filled 2024-11-28: qty 2

## 2024-11-28 MED ORDER — INSULIN ASPART 100 UNIT/ML IJ SOLN: 0.0000 [IU] | Freq: Every day | INTRAMUSCULAR | Status: DC

## 2024-11-28 NOTE — Progress Notes (Signed)
 " PROGRESS NOTE    Anne [name]  FMW:[id number] DOB: Aug 18, 1941 DOA: 11/10/2024 PCP: Domenica Harlene LABOR, MD    Chief Complaint  Patient presents with   Chest Pain    Brief Narrative:  84 year old female with history of metastatic breast cancer followed by [facility] presenting, COPD, anxiety/depression, dementia presented with worsening shortness of breath and was admitted for acute respiratory failure with hypoxia due to multifocal pneumonia.  COVID/RSV/influenza PCR negative.  She was started on broad-spectrum antibiotics.  She was transferred to SDU due to worsening hypoxemia requiring heated high flow nasal cannula oxygen.  PCCM was consulted.  Echo showed EF of 60 to 65% with grade 1 diastolic dysfunction.  She required Precedex  drip as well but subsequently was weaned off of the same.  She then required BiPAP and care was transferred to [facility] service from 11/16/2024 onwards.  She has finished a course of meropenem .  She is currently on high flow nasal cannula oxygen.  Care has been transferred back to [facility] service from 11/24/2024 onwards.    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 - Patient currently on high flow nasal cannula 4 to 5 L with sats of 96%  - Patient was on PCCM service transferred to [facility] service on 11/24/2024. - Status post completion of course of meropenem . - Chest x-ray 11/23/2024 with interval progression of bilateral interstitial densities. -  Continue Brovana  nebs, Pulmicort  nebs, IV PPI,yupelri  nebs. -Transition from IV Solu-Medrol  to oral prednisone  40 mg daily. - Continue to wean O2. - Will need outpatient follow-up with pulmonary.  2.  Acute metabolic encephalopathy/history of dementia/depression/anxiety -Mental status improving. -Likely close to baseline. - Patient has been weaned off Precedex  drip. - Continue fluoxetine . 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 - IV Ativan  as needed. - Noted to have not required Haldol  over the past few days. - Delirium precautions. - PT/OT.  3.  Leukocytosis - Improving.  - Status post completion full course of antibiotics of meropenem .  -Patient on steroids.   - Follow.   4.  Anemia of chronic disease -Patient with no overt signs of bleeding. - Hemoglobin stable at 10.7.   5.  Thrombocytosis -Felt likely to be reactive. - Follow.  6.  Backache - Continue lidocaine  patch, tramadol  as needed.   7.  Hypertension -BP noted to be on the softer side. - HCTZ discontinued. - Labetalol  as needed.  8.  Metastatic breast cancer -Outpatient follow-up with primary oncologist at [facility].  9.  Hypokalemia - Repleted.  10.  Constipation - Patient with good results on laxatives. - Continue MiraLAX  daily, Senokot-S twice daily.  - DC Dulcolax suppository.   11.  Goals of care -Patient noted with overall poor prognosis.  Palliative care was consulted for goals of care.    DVT prophylaxis: Lovenox  Code Status: Full Family Communication: Updated patient and daughter at bedside. Disposition: TBD  Status is: Inpatient  Remains inpatient appropriate because: Severity of illness   Consultants:  PCCM: Dr. Isaiah 11/14/2024 Palliative care: Dr. Clayton 11/25/2024  Procedures:  CT angiogram chest 11/10/2024 2D echo 11/14/2024  Significant Hospital Events: Including procedures,  antibiotic start and stop dates in addition to other pertinent events   12/31 Admitted 1/2 Transferred to SDU 1/3 PCCM consulted 1/3 echocardiogram > LVEF 60-65%, no regional wall motion abnormalities, grade 1 diastolic dysfunction, normal RV size and function.  PAP not estimated. 1/5 remains on 1.2 precedex  1/6 off precedex   1/9 remains on BiPAP and high flow nasal cannula 1/12 no acute issues overnight remains on HHFNC, did not require BiPAP overnight   Antimicrobials: Anti-infectives (From admission, onward)    Start     Dose/Rate Route Frequency Ordered Stop   11/17/24 0900  vancomycin  (VANCOCIN ) IVPB 1000 mg/200 mL premix  Status:  Discontinued       Placed in Followed by Linked Group   1,000 mg 200 mL/hr over 60 Minutes Intravenous Every 24 hours 11/16/24 0748 11/17/24 0803   11/16/24 0900  vancomycin  (VANCOREADY) IVPB 1250 mg/250 mL       Placed in Followed by Linked Group   1,250 mg 166.7 mL/hr over 90 Minutes Intravenous  Once 11/16/24 0748 11/16/24 0953   11/15/24 1045  meropenem  (MERREM ) 1 g in sodium chloride  0.9 % 100 mL IVPB        1 g 200 mL/hr over 30 Minutes Intravenous Every 12 hours 11/15/24 0946 11/21/24 2343   11/11/24 2200  vancomycin  (VANCOCIN ) IVPB 1000 mg/200 mL premix  Status:  Discontinued        1,000 mg 200 mL/hr over 60 Minutes Intravenous Every 24 hours 11/11/24 0814 11/11/24 1630   11/11/24 2100  cefTRIAXone  (ROCEPHIN ) 2 g in sodium chloride  0.9 % 100 mL IVPB  Status:  Discontinued        2 g 200 mL/hr over 30 Minutes Intravenous Every 24 hours 11/11/24 1613 11/15/24 0936   11/11/24 1800  azithromycin  (ZITHROMAX ) 500 mg in sodium chloride  0.9 % 250 mL IVPB        500 mg 250 mL/hr over 60 Minutes Intravenous Every 24 hours 11/11/24 1613 11/15/24 1903   11/11/24 1000  ceFEPIme  (MAXIPIME ) 2 g in sodium chloride  0.9 % 100 mL IVPB  Status:  Discontinued        2 g 200 mL/hr over 30 Minutes Intravenous Every 12 hours 11/11/24 0757 11/11/24 1630    11/10/24 2015  vancomycin  (VANCOCIN ) IVPB 1000 mg/200 mL premix        1,000 mg 200 mL/hr over 60 Minutes Intravenous  Once 11/10/24 2012 11/10/24 2358   11/10/24 2015  ceFEPIme  (MAXIPIME ) 2 g in sodium chloride  0.9 % 100 mL IVPB        2 g 200 mL/hr over 30 Minutes Intravenous  Once 11/10/24 2012 11/10/24 2218         Subjective: Sitting up in recliner.  Denies any chest pain or shortness of breath.  No abdominal pain.  Stated had bowel movement.  Feels well.  Anxious to go home.  Daughter at bedside.  Currently on 45 L of high flow nasal cannula with sats of 96%.  Objective: Vitals:   11/28/24 0800 11/28/24 0819 11/28/24 0955 11/28/24 1000  BP: 110/67   114/68  Pulse: 93  91 91  Resp: (!) 23  19 14   Temp: 97.6 F (36.4 C)     TempSrc: Axillary     SpO2: 97% 96% 96%  98%  Weight:      Height:        Intake/Output Summary (Last 24 hours) at 11/28/2024 1035 Last data filed at 11/27/2024 2000 Gross per 24 hour  Intake 240 ml  Output --  Net 240 ml    Filed Weights   11/11/24 0749 11/21/24 0526 11/24/24 0622  Weight: 59 kg 52.7 kg 52.7 kg    Examination:  General exam: NAD. Respiratory system: Decreased breath sounds in the bases.  Improved coarse breath sounds.  No wheezing.  No crackles.  Fair air movement.  Speaking in full sentences.  No use of accessory muscles of respiration.  Currently on 4 to 5 L high flow nasal cannula with sats of 96% Cardiovascular system: Regular rate rhythm no murmurs rubs or gallops.  No JVD.  No pitting lower extremity edema.  Gastrointestinal system: Abdomen is soft, nontender, nondistended, positive bowel sounds.  No rebound.  No guarding. Central nervous system: Alert and oriented.  Moving extremities spontaneously.  No focal neurological deficits. Extremities: Symmetric 5 x 5 power. Skin: No rashes, lesions or ulcers Psychiatry: Judgement and insight appear normal. Mood & affect appropriate.     Data Reviewed: I have personally  reviewed following labs and imaging studies  CBC: Recent Labs  Lab 11/24/24 1003 11/25/24 0814 11/26/24 0843 11/27/24 1117 11/28/24 0700  WBC 17.3* 14.0* 12.9* 13.7* 12.6*  NEUTROABS  --  10.7* 9.8* 12.5* 9.1*  HGB 10.9* 10.1* 10.6* 10.6* 10.7*  HCT 34.7* 32.0* 32.7* 33.2* 33.8*  MCV 92.5 92.5 92.1 93.5 92.3  PLT 432* 451* 430* 434* 414*    Basic Metabolic Panel: Recent Labs  Lab 11/24/24 1003 11/25/24 0814 11/26/24 0843 11/27/24 1117 11/28/24 0700  NA 136 139 137 139 139  K 3.9 3.2* 3.7 4.0 4.6  CL 100 99 101 100 101  CO2 27 28 26 26 31   GLUCOSE 93 83 109* 180* 90  BUN 17 19 20 22 18   CREATININE 0.42* 0.50 0.50 0.55 0.56  CALCIUM  9.1 8.6* 8.9 9.0 9.3  MG 2.3 2.4  --   --   --   PHOS 2.5 3.3  --   --   --     GFR: Estimated Creatinine Clearance: 45.1 mL/min (by C-G formula based on SCr of 0.56 mg/dL).  Liver Function Tests: No results for input(s): AST, ALT, ALKPHOS, BILITOT, PROT, ALBUMIN in the last 168 hours.  CBG: Recent Labs  Lab 11/27/24 0844 11/27/24 1153 11/27/24 1639 11/27/24 2120 11/28/24 0746  GLUCAP 118* 176* 139* 98 96     No results found for this or any previous visit (from the past 240 hours).        Radiology Studies: No results found.      Scheduled Meds:  amitriptyline   50 mg Oral QHS   arformoterol   15 mcg Nebulization BID   aspirin  EC  81 mg Oral Daily   bisacodyl   10 mg Rectal Once   brimonidine   1 drop Both Eyes Daily   budesonide  (PULMICORT ) nebulizer solution  0.5 mg Nebulization BID   Chlorhexidine  Gluconate Cloth  6 each Topical Daily   enoxaparin  (LOVENOX ) injection  40 mg Subcutaneous Q24H   FLUoxetine   60 mg Oral Daily   guaiFENesin -dextromethorphan   10 mL Oral TID   lidocaine   1 patch Transdermal Q24H   melatonin  5 mg Oral QHS   methylPREDNISolone  (SOLU-MEDROL ) injection  40 mg Intravenous Daily   mouth rinse  15 mL Mouth Rinse 4 times per day  pantoprazole   40 mg Oral QHS   polyethylene  glycol  17 g Oral Daily   revefenacin   175 mcg Nebulization Daily   senna-docusate  1 tablet Oral BID   sorbitol   30 mL Oral Once   thiamine   100 mg Oral Daily   Continuous Infusions:   LOS: 17 days    Time spent: 40 minutes    Toribio Hummer, MD [facility]   To contact the attending provider between 7A-7P or the covering provider during after hours 7P-7A, please log into the web site [email or web address] and access using universal [place] password for that web site. If you do not have the password, please call the hospital operator.  11/28/2024, 10:35 AM    "

## 2024-11-29 DIAGNOSIS — J188 Other pneumonia, unspecified organism: Secondary | ICD-10-CM | POA: Diagnosis not present

## 2024-11-29 DIAGNOSIS — J449 Chronic obstructive pulmonary disease, unspecified: Secondary | ICD-10-CM | POA: Diagnosis not present

## 2024-11-29 DIAGNOSIS — J9601 Acute respiratory failure with hypoxia: Secondary | ICD-10-CM | POA: Diagnosis not present

## 2024-11-29 DIAGNOSIS — J189 Pneumonia, unspecified organism: Secondary | ICD-10-CM | POA: Diagnosis not present

## 2024-11-29 LAB — GLUCOSE, CAPILLARY
Glucose-Capillary: 86 mg/dL (ref 70–99)
Glucose-Capillary: 115 mg/dL — ABNORMAL HIGH (ref 70–99)
Glucose-Capillary: 171 mg/dL — ABNORMAL HIGH (ref 70–99)
Glucose-Capillary: 110 mg/dL — ABNORMAL HIGH (ref 70–99)

## 2024-11-29 LAB — BASIC METABOLIC PANEL WITH GFR
Anion gap: 7 (ref 5–15)
BUN: 19 mg/dL (ref 8–23)
CO2: 31 mmol/L (ref 22–32)
Calcium: 9.4 mg/dL (ref 8.9–10.3)
Chloride: 98 mmol/L (ref 98–111)
Creatinine, Ser: 0.5 mg/dL (ref 0.44–1.00)
GFR, Estimated: >60 mL/min
Glucose, Bld: 84 mg/dL (ref 70–99)
Potassium: 3.7 mmol/L (ref 3.5–5.1)
Sodium: 136 mmol/L (ref 135–145)

## 2024-11-29 LAB — CBC
HCT: 31.2 % — ABNORMAL LOW (ref 36.0–46.0)
Hemoglobin: 10.1 g/dL — ABNORMAL LOW (ref 12.0–15.0)
MCH: 29.5 pg (ref 26.0–34.0)
MCHC: 32.4 g/dL (ref 30.0–36.0)
MCV: 91.2 fL (ref 80.0–100.0)
Platelets: 386 K/uL (ref 150–400)
RBC: 3.42 MIL/uL — ABNORMAL LOW (ref 3.87–5.11)
RDW: 14.6 % (ref 11.5–15.5)
WBC: 13.5 K/uL — ABNORMAL HIGH (ref 4.0–10.5)
nRBC: 0 % (ref 0.0–0.2)

## 2024-11-29 LAB — HEMOGLOBIN A1C
Hgb A1c MFr Bld: 6 % — ABNORMAL HIGH (ref 4.8–5.6)
Mean Plasma Glucose: 125.5 mg/dL

## 2024-11-29 MED ORDER — METHYLPREDNISOLONE SODIUM SUCC 40 MG IJ SOLR
20.0000 mg | Freq: Every day | INTRAMUSCULAR | Status: DC
  Administered 2024-11-29 – 2024-11-30 (×2): 20 mg via INTRAVENOUS
  Filled 2024-11-29 (×2): qty 1

## 2024-11-29 MED ORDER — SENNOSIDES 8.8 MG/5ML PO SYRP
5.0000 mL | ORAL_SOLUTION | Freq: Two times a day (BID) | ORAL | Status: DC
  Administered 2024-11-29 – 2024-12-01 (×4): 5 mL via ORAL
  Filled 2024-11-29 (×5): qty 5

## 2024-11-29 MED ORDER — PANTOPRAZOLE SODIUM 40 MG IV SOLR
40.0000 mg | INTRAVENOUS | Status: DC
  Administered 2024-11-29 – 2024-11-30 (×2): 40 mg via INTRAVENOUS
  Filled 2024-11-29 (×2): qty 10

## 2024-11-29 MED ORDER — THIAMINE HCL 100 MG/ML IJ SOLN
100.0000 mg | Freq: Every day | INTRAMUSCULAR | Status: DC
  Administered 2024-11-29 – 2024-12-01 (×3): 100 mg via INTRAVENOUS
  Filled 2024-11-29 (×3): qty 2

## 2024-11-29 NOTE — Progress Notes (Signed)
 Bipap is PRN order.  Not needed at this time but will continue to monitor for distress.

## 2024-11-29 NOTE — Plan of Care (Signed)

## 2024-11-29 NOTE — Evaluation (Signed)
 Clinical/Bedside Swallow Evaluation Patient Details  Name: Anne [name] MRN: [id number] Date of Birth: June 16, 1941  Today's Date: 11/29/2024 Time: SLP Start Time (ACUTE ONLY): 1520 SLP Stop Time (ACUTE ONLY): 1528 SLP Time Calculation (min) (ACUTE ONLY): 8 min  Past Medical History:  Past Medical History:  Diagnosis Date   ALKALINE PHOSPHATASE, ELEVATED 03/15/2009   Allergic state 06/10/2012   Allergy     Anemia    Anxiety and depression 04/28/2011   Arthritis    Asthma    Atypical chest pain 11/30/2011   AVM (arteriovenous malformation) of colon 2011   cecum   Baker's cyst of knee 05/22/2011   Cancer (HCC) 11/2006   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 Carotid duplex 03/2014: stable 1-39% BICA, f/u due 03/2016.   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     Clotting disorder    COPD (chronic obstructive pulmonary disease) (HCC)    Dementia (HCC)    Dermatitis 11/23/2012   Dyspnea    EE (eosinophilic esophagitis)    Emphysema of lung (HCC)    Esophageal ring    ESOPHAGEAL STRICTURE 03/29/2009   Fall 11/23/2012   Family history of breast cancer    Family history of colon cancer    Family history of ovarian cancer    Family history of pancreatic cancer    Folliculitis of nose 12/31/2011   GERD (gastroesophageal reflux disease) 09/29/2009   improved s/p cholecystectomy and esophagus dilatation   History of chicken pox    History of measles    History of shingles    2 episodes   Hx of echocardiogram    a. Echo 01/2013: mild LVH, EF 55-60%, normal wall motion, Gr 1 diast dysfn   Hyperlipidemia    Hypertension    Knee pain, bilateral 07/23/2011   Medicare annual wellness visit, subsequent 06/19/2015   Mixed hyperlipidemia 10/17/2010   Qualifier: Diagnosis of  By: Edsel Mems     Orthostasis    Osteopenia 03/14/2011   Osteoporosis    Personal history of  chemotherapy 2001   Personal history of radiation therapy 2001   rt breast   PERSONAL HX BREAST CANCER 09/29/2009   Pneumonia    PVC's (premature ventricular contractions)    a. Event monitor 01/2013: NSR, extensive PVCs.   Radial neck fracture 10/2011   minimally displaced   Substance abuse (HCC)    In remission 3 years.    Urinary incontinence 03/19/2012   Vaginitis 05/22/2011   Past Surgical History:  Past Surgical History:  Procedure Laterality Date   APPENDECTOMY     AUGMENTATION MAMMAPLASTY Right 05/27/2007   BREAST BIOPSY Right 01/02/2007   wire loc   BREAST BIOPSY  12/26/2006   BREAST LUMPECTOMY Right 2001   BREAST RECONSTRUCTION  2008, 2009, 2010   BREAST REDUCTION WITH MASTOPEXY Left 05/30/2017   Procedure: LEFT BREAST REDUCTION FOR SYMTERY WITH MASTOPEXY;  Surgeon: Lowery Estefana RAMAN, DO;  Location: [facility];  Service: Plastics;  Laterality: Left;   CHOLECYSTECTOMY  2010   COLONOSCOPY  09/05/10   cecal avm's   DENTAL SURGERY  05/2016   4 dental implants by Dr. Dorthula.   ERCP  2010    CBD stone extraction    ESOPHAGOGASTRODUODENOSCOPY  01/08/2012   Procedure: ESOPHAGOGASTRODUODENOSCOPY (EGD);  Surgeon: Lupita FORBES Commander, MD;  Location: [facility];  Service: Endoscopy;  Laterality: N/A;  ESOPHAGOGASTRODUODENOSCOPY (EGD) WITH ESOPHAGEAL DILATION  2010, 2012   LAPAROSCOPIC APPENDECTOMY N/A 08/04/2016   Procedure: APPENDECTOMY LAPAROSCOPIC;  Surgeon: Elspeth Schultze, MD;  Location: [facility];  Service: General;  Laterality: N/A;   LIPOSUCTION WITH LIPOFILLING Left 11/21/2017   Procedure: LIPOSUCTION FROM ABDOMEN WITH LIPOFILLING TO LEFT BREAST;  Surgeon: Lowery Estefana RAMAN, DO;  Location: [facility];  Service: Plastics;  Laterality: Left;   MASTECTOMY MODIFIED RADICAL Right 05/27/2007   , Mastectomy modified radical (08), breast reconstruction, CA lesions excised lateral abd wall 2010   MASTOPEXY Left 11/21/2017   Procedure: LEFT BREAST  REVISION MASTOPEXY FOR SYMMETRY;  Surgeon: Lowery Estefana RAMAN, DO;  Location: [facility];  Service: Plastics;  Laterality: Left;   REDUCTION MAMMAPLASTY Left    SAVORY DILATION  01/08/2012   Procedure: SAVORY DILATION;  Surgeon: Lupita FORBES Commander, MD;  Location: [facility];  Service: Endoscopy;  Laterality: N/A;  need xray   HPI:  Anne [name] is an 84 y.o. female who presented to the ED on 11/11/24 following urgent care visit on 12/29 for acute hypoxemic respiratory failure secondary to multifocal PNA. Covid/RSV/flu all negative. CTA showed infiltrates in RUL and LLL with background emphysema. She had worsening hypoxemia and was transferred to SDU. 1/9 CXR reported bilateral interstitial and airspace opacities, slightly increased on right since previous. She remained on Precedex  until 1/6, remained on BiPAP and HHFNC 1/9 but did not require BiPAP overnight 1/11 and as of 1/12 she is on HHFNC at 60L with FiO2 of 80. Started on a regular texture solids, thin liquids diet on 1/6. SLP swallow evaluation ordered 1/11, recs for regular/thin.  New BSE generated 1/18 with report of pt having difficulty swallowing pills. PMH: h/o esophageal stricture, s/p multiple EGD's with dilations with most recent in 2022. (followed by GI, Dr. Commander), GERD, dementia, asthma, eosinophilic esophagitis, dyspnea, COPD, CAD, breast cancer, s/p chemo.    Assessment / Plan / Recommendation  Clinical Impression  Pt presents with functional oropharyngeal swallowing as assessed clinically.  Pt tolerated all consistencies trialed without any clinical s/s of aspiration and exhibited good oral clearance of solids.  She has a primary esophageal dysphagia. Spouse reports 20 year hx esophageal dysphagia.  She sees Dr. Commander and has had several dilation in the past, most recently 3-4 years ago.  Recommend follow up with GI as OP if symptoms persist.  Provided brief education re esophageal dysphagia precautions and  strategies.  Pt/family know that capsule medications can be particularly difficult and have strategy of softening with water first for easier transit.  Pt has no further ST needs.  SLP will sign off.    Recommend continuing regular texture diet with thin liquids.   SLP Visit Diagnosis: Dysphagia, pharyngoesophageal phase (R13.14)    Aspiration Risk  Mild aspiration risk    Diet Recommendation Regular;Thin liquid    Liquid Administration via: Cup;Straw Medication Administration:  (As tolerated) Supervision: Patient able to self feed Compensations: Slow rate;Small sips/bites;Follow solids with liquid Postural Changes: Seated upright at 90 degrees;Remain upright for at least 30 minutes after po intake    Other Recommendations Recommended Consults:  (OP follow up with regular GI provider as needed) Oral Care Recommendations: Oral care BID     Swallow Evaluation Recommendations  See above   Assistance Recommended at Discharge  N/A  Functional Status Assessment Patient has not had a recent decline in their functional status  Frequency and Duration  (N/A)  Prognosis Prognosis for improved oropharyngeal function:  (N/A)      Swallow Study   General Date of Onset: 11/10/24 HPI: Anne [name] is an 84 y.o. female who presented to the ED on 11/11/24 following urgent care visit on 12/29 for acute hypoxemic respiratory failure secondary to multifocal PNA. Covid/RSV/flu all negative. CTA showed infiltrates in RUL and LLL with background emphysema. She had worsening hypoxemia and was transferred to SDU. 1/9 CXR reported bilateral interstitial and airspace opacities, slightly increased on right since previous. She remained on Precedex  until 1/6, remained on BiPAP and HHFNC 1/9 but did not require BiPAP overnight 1/11 and as of 1/12 she is on HHFNC at 60L with FiO2 of 80. Started on a regular texture solids, thin liquids diet on 1/6. SLP swallow evaluation ordered 1/11, recs for  regular/thin.  New BSE generated 1/18 with report of pt having difficulty swallowing pills. PMH: h/o esophageal stricture, s/p multiple EGD's with dilations with most recent in 2022. (followed by GI, Dr. Avram), GERD, dementia, asthma, eosinophilic esophagitis, dyspnea, COPD, CAD, breast cancer, s/p chemo. Type of Study: Bedside Swallow Evaluation Previous Swallow Assessment: BSE 1/12 Diet Prior to this Study: Regular;Thin liquids (Level 0) Temperature Spikes Noted: No Respiratory Status: Nasal cannula History of Recent Intubation: No Behavior/Cognition: Alert;Cooperative;Pleasant mood Oral Care Completed by SLP: No Oral Cavity - Dentition: Dentures, top;Dentures, bottom Vision: Functional for self-feeding Self-Feeding Abilities: Able to feed self Patient Positioning: Upright in chair Baseline Vocal Quality: Normal Volitional Cough: Strong Volitional Swallow: Able to elicit    Oral/Motor/Sensory Function Overall Oral Motor/Sensory Function: Within functional limits   Ice Chips Ice chips: Not tested   Thin Liquid Thin Liquid: Within functional limits Presentation: Straw    Nectar Thick Nectar Thick Liquid: Not tested   Honey Thick Honey Thick Liquid: Not tested   Puree Puree: Within functional limits Presentation: Spoon   Solid     Solid: Within functional limits Presentation: Self Fed      Anette FORBES Grippe, MA, CCC-SLP Acute Rehabilitation Services Office: [phone] 11/29/2024,3:48 PM

## 2024-11-29 NOTE — Progress Notes (Signed)
 " PROGRESS NOTE    Anne [name]  FMW:[id number] DOB: 08/24/1941 DOA: 11/10/2024 PCP: Domenica Harlene LABOR, MD    Chief Complaint  Patient presents with   Chest Pain    Brief Narrative:  84 year old female with history of metastatic breast cancer followed by [facility] presenting, COPD, anxiety/depression, dementia presented with worsening shortness of breath and was admitted for acute respiratory failure with hypoxia due to multifocal pneumonia.  COVID/RSV/influenza PCR negative.  She was started on broad-spectrum antibiotics.  She was transferred to SDU due to worsening hypoxemia requiring heated high flow nasal cannula oxygen.  PCCM was consulted.  Echo showed EF of 60 to 65% with grade 1 diastolic dysfunction.  She required Precedex  drip as well but subsequently was weaned off of the same.  She then required BiPAP and care was transferred to [facility] service from 11/16/2024 onwards.  She has finished a course of meropenem .  She is currently on high flow nasal cannula oxygen.  Care has been transferred back to [facility] service from 11/24/2024 onwards.    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 - Patient currently on high flow nasal cannula 2 L with sats of 92%. - Patient was on PCCM service transferred to [facility] service on 11/24/2024. - Status post completion of course of meropenem . - Chest x-ray 11/23/2024 with interval progression of bilateral interstitial densities. - Continue Brovana  nebs, Pulmicort  nebs,  PPI,yupelri  nebs. -Transition from IV Solu-Medrol  to oral prednisone  40 mg daily today. - Continue to wean O2. - Will need outpatient follow-up with pulmonary.  2.  Acute metabolic encephalopathy/history of dementia/depression/anxiety -Mental status improving. -Likely close to baseline. - Patient has been weaned off Precedex  drip. - Continue fluoxetine . 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 - IV Ativan  as needed. - Noted to have not required Haldol  over the past few days. - Delirium precautions. - PT/OT.  3.  Leukocytosis - Improving.  - Status post completion full course of antibiotics of meropenem .  -Patient on steroids and being transition from IV Solu-Medrol  to oral prednisone .   - Follow.   4.  Anemia of chronic disease -Patient with no overt signs of bleeding. - Hemoglobin stable at 10.1.  5.  Thrombocytosis -Felt likely to be reactive. - Resolved.   6.  Backache - Continue lidocaine  patch, tramadol  as needed.   7.  Hypertension -BP noted to be on the softer side. - HCTZ discontinued. - Labetalol  as needed.  8.  Metastatic breast cancer -Outpatient follow-up with primary oncologist at [facility].  9.  Hypokalemia - Repleted.  10.  Constipation - Patient with good results on laxatives. - Continue current bowel regimen of MiraLAX  daily, Senokot-S twice daily.   11.  Goals of care -Patient noted with overall poor prognosis.  Palliative care was consulted for goals of care.    DVT prophylaxis: Lovenox  Code Status: Full Family Communication: Updated patient and daughter at bedside. Disposition: Transfer to telemetry.  Likely home  with home health when medically stable.   Status is: Inpatient Remains inpatient appropriate because: Severity of illness   Consultants:  PCCM: Dr. Isaiah 11/14/2024 Palliative care: Dr. Clayton 11/25/2024  Procedures:  CT  angiogram chest 11/10/2024 2D echo 11/14/2024  Significant Hospital Events: Including procedures, antibiotic start and stop dates in addition to other pertinent events   12/31 Admitted 1/2 Transferred to SDU 1/3 PCCM consulted 1/3 echocardiogram > LVEF 60-65%, no regional wall motion abnormalities, grade 1 diastolic dysfunction, normal RV size and function.  PAP not estimated. 1/5 remains on 1.2 precedex  1/6 off precedex   1/9 remains on BiPAP and high flow nasal cannula 1/12 no acute issues overnight remains on HHFNC, did not require BiPAP overnight   Antimicrobials: Anti-infectives (From admission, onward)    Start     Dose/Rate Route Frequency Ordered Stop   11/17/24 0900  vancomycin  (VANCOCIN ) IVPB 1000 mg/200 mL premix  Status:  Discontinued       Placed in Followed by Linked Group   1,000 mg 200 mL/hr over 60 Minutes Intravenous Every 24 hours 11/16/24 0748 11/17/24 0803   11/16/24 0900  vancomycin  (VANCOREADY) IVPB 1250 mg/250 mL       Placed in Followed by Linked Group   1,250 mg 166.7 mL/hr over 90 Minutes Intravenous  Once 11/16/24 0748 11/16/24 0953   11/15/24 1045  meropenem  (MERREM ) 1 g in sodium chloride  0.9 % 100 mL IVPB        1 g 200 mL/hr over 30 Minutes Intravenous Every 12 hours 11/15/24 0946 11/21/24 2343   11/11/24 2200  vancomycin  (VANCOCIN ) IVPB 1000 mg/200 mL premix  Status:  Discontinued        1,000 mg 200 mL/hr over 60 Minutes Intravenous Every 24 hours 11/11/24 0814 11/11/24 1630   11/11/24 2100  cefTRIAXone  (ROCEPHIN ) 2 g in sodium chloride  0.9 % 100 mL IVPB  Status:  Discontinued        2 g 200 mL/hr over 30 Minutes Intravenous Every 24 hours 11/11/24 1613 11/15/24 0936   11/11/24 1800  azithromycin  (ZITHROMAX ) 500 mg in sodium chloride  0.9 % 250 mL IVPB        500 mg 250 mL/hr over 60 Minutes Intravenous Every 24 hours 11/11/24 1613 11/15/24 1903   11/11/24 1000  ceFEPIme  (MAXIPIME ) 2 g in sodium chloride  0.9 % 100 mL IVPB  Status:  Discontinued         2 g 200 mL/hr over 30 Minutes Intravenous Every 12 hours 11/11/24 0757 11/11/24 1630   11/10/24 2015  vancomycin  (VANCOCIN ) IVPB 1000 mg/200 mL premix        1,000 mg 200 mL/hr over 60 Minutes Intravenous  Once 11/10/24 2012 11/10/24 2358   11/10/24 2015  ceFEPIme  (MAXIPIME ) 2 g in sodium chloride  0.9 % 100 mL IVPB        2 g 200 mL/hr over 30 Minutes Intravenous  Once 11/10/24 2012 11/10/24 2218         Subjective: Sitting up in recliner.  Feeling better.  Denies any chest pain or significant shortness of breath.  Daughter at bedside.  Currently on 2 L high flow nasal cannula with O2 of 92%.   Objective: Vitals:   11/29/24 0700 11/29/24 0735 11/29/24 0812 11/29/24 0900  BP: (!) 113/49  115/66 (!) 102/53  Pulse: 96 96 96 98  Resp: 19 (!) 21 19 18   Temp:   98.8 F (37.1 C)   TempSrc:   Axillary   SpO2: 92% 90% 91%  92%  Weight:      Height:       No intake or output data in the 24 hours ending 11/29/24 1024   Filed Weights   11/11/24 0749 11/21/24 0526 11/24/24 0622  Weight: 59 kg 52.7 kg 52.7 kg    Examination:  General exam: NAD. Respiratory system: Improving coarse breath sounds.  No wheezing.  No crackles.  Fair air movement.  Speaking in full sentences.  No use of accessory muscles of respiration.  On 2 L high flow nasal cannula with sats of 92%.  Cardiovascular system: RRR no murmurs rubs or gallops.  No JVD.  No pitting lower extremity edema.  Gastrointestinal system: Abdomen is soft, nontender, nondistended, positive bowel sounds.  No rebound.  No guarding. Central nervous system: Alert and oriented.  Moving extremities spontaneously.  No focal neurological deficits.   Extremities: Symmetric 5 x 5 power. Skin: No rashes, lesions or ulcers Psychiatry: Judgement and insight appear normal. Mood & affect appropriate.     Data Reviewed: I have personally reviewed following labs and imaging studies  CBC: Recent Labs  Lab 11/25/24 0814 11/26/24 0843  11/27/24 1117 11/28/24 0700 11/29/24 0528  WBC 14.0* 12.9* 13.7* 12.6* 13.5*  NEUTROABS 10.7* 9.8* 12.5* 9.1*  --   HGB 10.1* 10.6* 10.6* 10.7* 10.1*  HCT 32.0* 32.7* 33.2* 33.8* 31.2*  MCV 92.5 92.1 93.5 92.3 91.2  PLT 451* 430* 434* 414* 386    Basic Metabolic Panel: Recent Labs  Lab 11/24/24 1003 11/25/24 0814 11/26/24 0843 11/27/24 1117 11/28/24 0700 11/29/24 0528  NA 136 139 137 139 139 136  K 3.9 3.2* 3.7 4.0 4.6 3.7  CL 100 99 101 100 101 98  CO2 27 28 26 26 31 31   GLUCOSE 93 83 109* 180* 90 84  BUN 17 19 20 22 18 19   CREATININE 0.42* 0.50 0.50 0.55 0.56 0.50  CALCIUM  9.1 8.6* 8.9 9.0 9.3 9.4  MG 2.3 2.4  --   --   --   --   PHOS 2.5 3.3  --   --   --   --     GFR: Estimated Creatinine Clearance: 45.1 mL/min (by C-G formula based on SCr of 0.5 mg/dL).  Liver Function Tests: No results for input(s): AST, ALT, ALKPHOS, BILITOT, PROT, ALBUMIN in the last 168 hours.  CBG: Recent Labs  Lab 11/28/24 0746 11/28/24 1159 11/28/24 1700 11/28/24 2120 11/29/24 0750  GLUCAP 96 193* 251* 107* 86     No results found for this or any previous visit (from the past 240 hours).        Radiology Studies: No results found.      Scheduled Meds:  amitriptyline   50 mg Oral QHS   arformoterol   15 mcg Nebulization BID   aspirin  EC  81 mg Oral Daily   brimonidine   1 drop Both Eyes Daily   budesonide  (PULMICORT ) nebulizer solution  0.5 mg Nebulization BID   Chlorhexidine  Gluconate Cloth  6 each Topical Daily   enoxaparin  (LOVENOX ) injection  40 mg Subcutaneous Q24H   FLUoxetine   60 mg Oral Daily   guaiFENesin -dextromethorphan   10 mL Oral TID   insulin  aspart  0-5 Units Subcutaneous QHS   insulin  aspart  0-9 Units Subcutaneous TID WC   lidocaine   1 patch Transdermal Q24H   melatonin  5 mg Oral QHS   mouth rinse  15 mL Mouth Rinse 4 times per day   pantoprazole   40 mg Oral QHS   polyethylene  glycol  17 g Oral Daily   predniSONE   40 mg Oral QAC  breakfast   revefenacin   175 mcg Nebulization Daily   senna-docusate  1 tablet Oral BID   sorbitol   30 mL Oral Once   thiamine   100 mg Oral Daily   Continuous Infusions:   LOS: 18 days    Time spent: 35 minutes    Toribio Hummer, MD [facility]   To contact the attending provider between 7A-7P or the covering provider during after hours 7P-7A, please log into the web site [email or web address] and access using universal [place] password for that web site. If you do not have the password, please call the hospital operator.  11/29/2024, 10:24 AM    "

## 2024-11-30 DIAGNOSIS — Z515 Encounter for palliative care: Secondary | ICD-10-CM

## 2024-11-30 DIAGNOSIS — Z7189 Other specified counseling: Secondary | ICD-10-CM

## 2024-11-30 LAB — GLUCOSE, CAPILLARY
Glucose-Capillary: 88 mg/dL (ref 70–99)
Glucose-Capillary: 135 mg/dL — ABNORMAL HIGH (ref 70–99)
Glucose-Capillary: 164 mg/dL — ABNORMAL HIGH (ref 70–99)
Glucose-Capillary: 116 mg/dL — ABNORMAL HIGH (ref 70–99)

## 2024-11-30 LAB — BASIC METABOLIC PANEL WITH GFR
Anion gap: 10 (ref 5–15)
BUN: 19 mg/dL (ref 8–23)
CO2: 27 mmol/L (ref 22–32)
Calcium: 9.3 mg/dL (ref 8.9–10.3)
Chloride: 99 mmol/L (ref 98–111)
Creatinine, Ser: 0.53 mg/dL (ref 0.44–1.00)
GFR, Estimated: >60 mL/min
Glucose, Bld: 105 mg/dL — ABNORMAL HIGH (ref 70–99)
Potassium: 3.7 mmol/L (ref 3.5–5.1)
Sodium: 136 mmol/L (ref 135–145)

## 2024-11-30 LAB — CBC
HCT: 32 % — ABNORMAL LOW (ref 36.0–46.0)
Hemoglobin: 10.4 g/dL — ABNORMAL LOW (ref 12.0–15.0)
MCH: 29.6 pg (ref 26.0–34.0)
MCHC: 32.5 g/dL (ref 30.0–36.0)
MCV: 91.2 fL (ref 80.0–100.0)
Platelets: 355 K/uL (ref 150–400)
RBC: 3.51 MIL/uL — ABNORMAL LOW (ref 3.87–5.11)
RDW: 14.9 % (ref 11.5–15.5)
WBC: 13.8 K/uL — ABNORMAL HIGH (ref 4.0–10.5)
nRBC: 0 % (ref 0.0–0.2)

## 2024-11-30 MED ORDER — PREDNISONE 20 MG PO TABS
40.0000 mg | ORAL_TABLET | Freq: Every day | ORAL | Status: DC
  Administered 2024-12-01: 40 mg via ORAL
  Filled 2024-11-30: qty 2

## 2024-11-30 NOTE — Plan of Care (Signed)

## 2024-11-30 NOTE — Progress Notes (Signed)
 " PROGRESS NOTE    Anne [name]  FMW:[id number] DOB: [birth year] DOA: 11/10/2024 PCP: Domenica Harlene LABOR, MD    Chief Complaint  Patient presents with   Chest Pain    Brief Narrative:  84 year old female with history of metastatic breast cancer followed by [facility] presenting, COPD, anxiety/depression, dementia presented with worsening shortness of breath and was admitted for acute respiratory failure with hypoxia due to multifocal pneumonia.  COVID/RSV/influenza PCR negative.  She was started on broad-spectrum antibiotics.  She was transferred to SDU due to worsening hypoxemia requiring heated high flow nasal cannula oxygen.  PCCM was consulted.  Echo showed EF of 60 to 65% with grade 1 diastolic dysfunction.  She required Precedex  drip as well but subsequently was weaned off of the same.  She then required BiPAP and care was transferred to [facility] service from 11/16/2024 onwards.  She has finished a course of meropenem .  She is currently on high flow nasal cannula oxygen.  Care has been transferred back to [facility] service from 11/24/2024 onwards.    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 - Patient currently on high flow nasal cannula 2 L with sats of 92%. - Patient was on PCCM service transferred to [facility] service on 11/24/2024. - Status post completion of course of meropenem . - Chest x-ray 11/23/2024 with interval progression of bilateral interstitial densities. - Continue Brovana  nebs, Pulmicort  nebs,  PPI,yupelri  nebs. - Currently on IV Solu-Medrol  and will transition to oral prednisone  taper tomorrow.  - Continue to wean O2. - Will need outpatient follow-up with pulmonary.  2.  Acute metabolic encephalopathy/history of dementia/depression/anxiety -Mental status improving. -Likely close to baseline. - Patient has been weaned off Precedex  drip. - Continue fluoxetine . 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 - IV Ativan  as needed. - Noted to have not required Haldol  over the past few days. - Delirium precautions. - PT/OT.  3.  Leukocytosis - Improving.  - Status post completion full course of antibiotics of meropenem .  -Patient on steroids and being transition from IV Solu-Medrol  to oral prednisone  tomorrow.   - Follow.   4.  Anemia of chronic disease - Hemoglobin stable at 10.4.   5.  Thrombocytosis -Felt likely to be reactive. - Resolved.   6.  Backache - Continue lidocaine  patch, tramadol  as needed.   7.  Hypertension -BP noted to be on the softer side. - Off HCTZ.   - Labetalol  as needed.   8.  Metastatic breast cancer -Outpatient follow-up with primary oncologist at [facility].  9.  Hypokalemia - Repleted.  10.  Constipation - Patient with good results on laxatives. - Continue current bowel regimen of MiraLAX  daily, Senokot-S twice daily.   11.  Goals of care -Patient noted with overall poor prognosis.  Palliative care was consulted for goals of care.    DVT prophylaxis: Lovenox  Code Status: Full Family Communication: Updated patient and daughter at bedside. Disposition: Likely home with home health  hopefully in the next 24 hours.    Status is: Inpatient Remains inpatient appropriate because: Severity of illness   Consultants:  PCCM: Dr. Isaiah 11/14/2024 Palliative care: Dr. Clayton 11/25/2024  Procedures:  CT angiogram chest 11/10/2024 2D echo  11/14/2024  Significant Hospital Events: Including procedures, antibiotic start and stop dates in addition to other pertinent events   12/31 Admitted 1/2 Transferred to SDU 1/3 PCCM consulted 1/3 echocardiogram > LVEF 60-65%, no regional wall motion abnormalities, grade 1 diastolic dysfunction, normal RV size and function.  PAP not estimated. 1/5 remains on 1.2 precedex  1/6 off precedex   1/9 remains on BiPAP and high flow nasal cannula 1/12 no acute issues overnight remains on HHFNC, did not require BiPAP overnight   Antimicrobials: Anti-infectives (From admission, onward)    Start     Dose/Rate Route Frequency Ordered Stop   11/17/24 0900  vancomycin  (VANCOCIN ) IVPB 1000 mg/200 mL premix  Status:  Discontinued       Placed in Followed by Linked Group   1,000 mg 200 mL/hr over 60 Minutes Intravenous Every 24 hours 11/16/24 0748 11/17/24 0803   11/16/24 0900  vancomycin  (VANCOREADY) IVPB 1250 mg/250 mL       Placed in Followed by Linked Group   1,250 mg 166.7 mL/hr over 90 Minutes Intravenous  Once 11/16/24 0748 11/16/24 0953   11/15/24 1045  meropenem  (MERREM ) 1 g in sodium chloride  0.9 % 100 mL IVPB        1 g 200 mL/hr over 30 Minutes Intravenous Every 12 hours 11/15/24 0946 11/21/24 2343   11/11/24 2200  vancomycin  (VANCOCIN ) IVPB 1000 mg/200 mL premix  Status:  Discontinued        1,000 mg 200 mL/hr over 60 Minutes Intravenous Every 24 hours 11/11/24 0814 11/11/24 1630   11/11/24 2100  cefTRIAXone  (ROCEPHIN ) 2 g in sodium chloride  0.9 % 100 mL IVPB  Status:  Discontinued        2 g 200 mL/hr over 30 Minutes Intravenous Every 24 hours 11/11/24 1613 11/15/24 0936   11/11/24 1800  azithromycin  (ZITHROMAX ) 500 mg in sodium chloride  0.9 % 250 mL IVPB        500 mg 250 mL/hr over 60 Minutes Intravenous Every 24 hours 11/11/24 1613 11/15/24 1903   11/11/24 1000  ceFEPIme  (MAXIPIME ) 2 g in sodium chloride  0.9 % 100 mL IVPB  Status:  Discontinued        2 g 200 mL/hr over 30  Minutes Intravenous Every 12 hours 11/11/24 0757 11/11/24 1630   11/10/24 2015  vancomycin  (VANCOCIN ) IVPB 1000 mg/200 mL premix        1,000 mg 200 mL/hr over 60 Minutes Intravenous  Once 11/10/24 2012 11/10/24 2358   11/10/24 2015  ceFEPIme  (MAXIPIME ) 2 g in sodium chloride  0.9 % 100 mL IVPB        2 g 200 mL/hr over 30 Minutes Intravenous  Once 11/10/24 2012 11/10/24 2218         Subjective: Patient sitting up in chair eating breakfast.  Overall feeling better.  Denies any chest pain no significant shortness of breath.  Currently on 3 L nasal cannula.  Daughter at bedside.    Objective: Vitals:   11/29/24 2102 11/29/24 2121 11/30/24 0557 11/30/24 0937  BP: 120/77  111/67   Pulse: (!) 102  91   Resp: 20  (!) 22   Temp: 97.8 F (36.6 C)  98 F (36.7 C)   TempSrc:      SpO2: 93%  94% 93% 93%  Weight:      Height:       No intake or output data in the 24 hours ending 11/30/24 1059   Filed Weights   11/11/24 0749 11/21/24 0526 11/24/24 0622  Weight: 59 kg 52.7 kg 52.7 kg    Examination:  General exam: NAD. Respiratory system: Improved coarse breath sounds.  Some inspiratory minimal wheezing.  No crackles.  Fair air movement.  No use of accessory muscles of respiration.  Patient with sats of 93% on 3 L nasal cannula. Cardiovascular system: Regular rate rhythm no murmurs rubs or gallops.  No JVD.  No pitting lower extremity edema.   Gastrointestinal system: Abdomen is soft, nontender, nondistended, positive bowel sounds.  No rebound.  No guarding.  Central nervous system: Alert and oriented.  Moving extremities spontaneously.  No focal neurological deficits.   Extremities: Symmetric 5 x 5 power. Skin: No rashes, lesions or ulcers Psychiatry: Judgement and insight appear normal. Mood & affect appropriate.     Data Reviewed: I have personally reviewed following labs and imaging studies  CBC: Recent Labs  Lab 11/25/24 0814 11/26/24 0843 11/27/24 1117 11/28/24 0700  11/29/24 0528 11/30/24 0901  WBC 14.0* 12.9* 13.7* 12.6* 13.5* 13.8*  NEUTROABS 10.7* 9.8* 12.5* 9.1*  --   --   HGB 10.1* 10.6* 10.6* 10.7* 10.1* 10.4*  HCT 32.0* 32.7* 33.2* 33.8* 31.2* 32.0*  MCV 92.5 92.1 93.5 92.3 91.2 91.2  PLT 451* 430* 434* 414* 386 355    Basic Metabolic Panel: Recent Labs  Lab 11/24/24 1003 11/25/24 0814 11/26/24 0843 11/27/24 1117 11/28/24 0700 11/29/24 0528 11/30/24 0901  NA 136 139 137 139 139 136 136  K 3.9 3.2* 3.7 4.0 4.6 3.7 3.7  CL 100 99 101 100 101 98 99  CO2 27 28 26 26 31 31 27   GLUCOSE 93 83 109* 180* 90 84 105*  BUN 17 19 20 22 18 19 19   CREATININE 0.42* 0.50 0.50 0.55 0.56 0.50 0.53  CALCIUM  9.1 8.6* 8.9 9.0 9.3 9.4 9.3  MG 2.3 2.4  --   --   --   --   --   PHOS 2.5 3.3  --   --   --   --   --     GFR: Estimated Creatinine Clearance: 45.1 mL/min (by C-G formula based on SCr of 0.53 mg/dL).  Liver Function Tests: No results for input(s): AST, ALT, ALKPHOS, BILITOT, PROT, ALBUMIN in the last 168 hours.  CBG: Recent Labs  Lab 11/29/24 0750 11/29/24 1259 11/29/24 1750 11/29/24 2118 11/30/24 0748  GLUCAP 86 115* 171* 110* 88     No results found for this or any previous visit (from the past 240 hours).        Radiology Studies: No results found.      Scheduled Meds:  amitriptyline   50 mg Oral QHS   arformoterol   15 mcg Nebulization BID   aspirin  EC  81 mg Oral Daily   brimonidine   1 drop Both Eyes Daily   budesonide  (PULMICORT ) nebulizer solution  0.5 mg Nebulization BID   Chlorhexidine  Gluconate Cloth  6 each Topical Daily   enoxaparin  (LOVENOX ) injection  40 mg Subcutaneous Q24H   FLUoxetine   60 mg Oral Daily   guaiFENesin -dextromethorphan   10 mL Oral TID   insulin  aspart  0-5 Units Subcutaneous QHS   insulin  aspart  0-9 Units Subcutaneous TID WC   lidocaine   1 patch Transdermal Q24H   melatonin  5  mg Oral QHS   methylPREDNISolone  (SOLU-MEDROL ) injection  20 mg Intravenous Daily   mouth  rinse  15 mL Mouth Rinse 4 times per day   pantoprazole  (PROTONIX ) IV  40 mg Intravenous Q24H   polyethylene glycol  17 g Oral Daily   revefenacin   175 mcg Nebulization Daily   sennosides  5 mL Oral BID   sorbitol   30 mL Oral Once   thiamine  (VITAMIN B1) injection  100 mg Intravenous Daily   Continuous Infusions:   LOS: 19 days    Time spent: 35 minutes    Toribio Hummer, MD [facility]   To contact the attending provider between 7A-7P or the covering provider during after hours 7P-7A, please log into the web site [email or web address] and access using universal [place] password for that web site. If you do not have the password, please call the hospital operator.  11/30/2024, 10:59 AM    "

## 2024-11-30 NOTE — Progress Notes (Signed)
 "                                                                                                                                                                                                          Daily Progress Note   Patient Name: Anne [name]       Date: 11/30/2024 DOB: [birth year]  Age: 84 y.o. MRN#: [id number] Attending Physician: Anne Toribio GAILS, MD Primary Care Physician: Anne Harlene LABOR, MD Admit Date: 11/10/2024  Reason for Consultation/Follow-up: Establishing goals of care  Length of Stay: 19  Current Medications: Scheduled Meds:   amitriptyline   50 mg Oral QHS   arformoterol   15 mcg Nebulization BID   aspirin  EC  81 mg Oral Daily   brimonidine   1 drop Both Eyes Daily   budesonide  (PULMICORT ) nebulizer solution  0.5 mg Nebulization BID   Chlorhexidine  Gluconate Cloth  6 each Topical Daily   enoxaparin  (LOVENOX ) injection  40 mg Subcutaneous Q24H   FLUoxetine   60 mg Oral Daily   guaiFENesin -dextromethorphan   10 mL Oral TID   insulin  aspart  0-5 Units Subcutaneous QHS   insulin  aspart  0-9 Units Subcutaneous TID WC   lidocaine   1 patch Transdermal Q24H   melatonin  5 mg Oral QHS   methylPREDNISolone  (SOLU-MEDROL ) injection  20 mg Intravenous Daily   mouth rinse  15 mL Mouth Rinse 4 times per day   pantoprazole  (PROTONIX ) IV  40 mg Intravenous Q24H   polyethylene glycol  17 g Oral Daily   revefenacin   175 mcg Nebulization Daily   sennosides  5 mL Oral BID   sorbitol   30 mL Oral Once   thiamine  (VITAMIN B1) injection  100 mg Intravenous Daily    Continuous Infusions:   PRN Meds: acetaminophen  (TYLENOL ) oral liquid 160 mg/5 mL, benzonatate , haloperidol  lactate, ipratropium-albuterol , labetalol , LORazepam , Muscle Rub, nitroGLYCERIN , mouth rinse, mouth rinse, traMADol   Physical Exam Constitutional:      General: She is not in acute distress.    Interventions: Nasal cannula in place.  HENT:     Head: Normocephalic and atraumatic.  Cardiovascular:      Rate and Rhythm: Normal rate.  Pulmonary:     Effort: Tachypnea present.  Skin:    General: Skin is dry.  Neurological:     Mental Status: She is alert and oriented to person, place, and time.  Psychiatric:        Mood and Affect: Mood normal.        Behavior: Behavior normal.  Vital Signs: BP 111/67 (BP Location: Left Arm)   Pulse 91   Temp 98 F (36.7 C)   Resp (!) 22   Ht 5' 5 (1.651 m)   Wt 52.7 kg   SpO2 93%   BMI 19.33 kg/m  SpO2: SpO2: 93 % O2 Device: O2 Device: Nasal Cannula O2 Flow Rate: O2 Flow Rate (L/min): 3 L/min (cound on 3)  Intake/output summary: No intake or output data in the 24 hours ending 11/30/24 1116 LBM: Last BM Date : 11/28/24 Baseline Weight: Weight: 59 kg Most recent weight: Weight: 52.7 kg       Palliative Assessment/Data:      Patient Active Problem List   Diagnosis Date Noted   Acute metabolic encephalopathy 11/25/2024   Leukocytosis 11/25/2024   Essential hypertension 11/25/2024   Acute pulmonary edema (HCC) 11/14/2024   Acute respiratory failure with hypoxia (HCC) 11/11/2024   Sepsis (HCC) 11/11/2024   Multifocal pneumonia 11/11/2024   COPD (chronic obstructive pulmonary disease) (HCC) 11/11/2024   Pneumonia 11/10/2024   Fall 06/26/2024   Gait instability 06/26/2024   Esophageal abnormality 06/26/2024   Intractable episodic tension-type headache 06/26/2024   Fracture of right acetabulum (HCC) 02/04/2023   Neuropathy 11/23/2022   Fibromyalgia 10/16/2022   Primary osteoarthritis of right knee 09/04/2022   Breast mass, left 05/03/2022   Severe depression (HCC) 03/08/2022   Memory changes 02/03/2022   Insomnia 12/28/2020   Vertigo 12/28/2020   Emphysema lung (HCC) 06/06/2020   RLS (restless legs syndrome) 02/24/2020   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   Facial trauma, sequela 08/09/2019   Hyperglycemia 08/19/2018   Mild intermittent asthma without complication 06/17/2018   Asthma 04/30/2018    Macular degeneration, 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   Palpitations 04/30/2018   Lumbar spondylosis 09/23/2017   Hypokalemia 08/11/2017   Abdominal pain 08/11/2017   SBO (small bowel obstruction) (HCC) 08/10/2017   History of right breast cancer 03/21/2017   Genetic testing 09/13/2016   Family history of breast cancer    Family history of pancreatic cancer    Family history of colon cancer    Pain in the chest 05/16/2016   Status post right breast reconstruction 05/11/2016   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) 05/11/2016   History of breast cancer in female 05/11/2016   Osteopenia determined by x-ray 05/11/2016   IBS (irritable bowel syndrome) 03/09/2016   Dyspnea 06/19/2015   Medicare annual wellness visit, subsequent 06/19/2015   Urinary frequency 12/12/2014   Breast cancer, right breast (HCC) 10/18/2014   Superficial thrombophlebitis 03/16/2014   Plant dermatitis 03/16/2014   Chest wall pain 02/07/2014   Primary osteoarthritis of both hips 01/21/2014   Elevated sed rate 08/02/2013   Dermatitis 11/23/2012   Falls 11/23/2012   Preventative health care 10/21/2012   Allergy  06/10/2012   Urinary incontinence 03/19/2012   Anemia 12/21/2011   Baker's cyst of knee 05/22/2011   Eosinophilic esophagitis 05/11/2011   Anxiety and depression 04/28/2011   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 female  with past medical history of breast cancer with mets to lungs and lymph nodes on treatment at [facility], COPD, anxiety/depression,  admitted on 11/10/2024 with multifocal pneumonia. She previously required bipap. Now on heated high flow oxygen. Palliative medicine consulted for GOC.  Today's Discussion: Reviewed chart. Patient evaluated yesterday by SLP for her  functional oropharyngeal swallowing. Patient is currently on 3L O2 by nasal cannula. She is sitting up in the chair with her family member at bedside. Patient says she is ready to go home and asks what the criteria is for discharging. Encouraged patient to discuss this with the attending provider. Patient reports working with SLP for pill swallowing was very helpful and that today was a good pill swallowing day. She plans to continue using the strategy given to her by SLP once home and taking pills.  Reviewed patient's discussion with PMT on 1/15 while in the ICU. Patient confirms goals remain full code and full scope of care.  Emotional support and therapeutic listening provided. Encouraged patient to call PMT with questions or concerns. PMT will continue to follow peripherally.  Recommendations/Plan: Full code Full scope Home at discharge PMT will continue to follow peripherally   Code Status:    Code Status Orders  (From admission, onward)           Start     Ordered   11/11/24 1613  Full code  Continuous       Question:  By:  Answer:  Consent: discussion documented in EHR   11/11/24 1613         Extensive chart review has been completed prior to seeing the patient including labs, vital signs, imaging, progress/consult notes, orders, medications, and available advance directive documents.  Care plan was discussed with Dr. Sebastian  Time spent: 25 minutes  Thank you for allowing the Palliative Medicine Team to assist in the care of this patient.   Stephane CHRISTELLA Palin, NP  Please contact Palliative Medicine Team phone at [phone] for questions and concerns.       "

## 2024-11-30 NOTE — Progress Notes (Signed)
 SATURATION QUALIFICATIONS: (This note is used to comply with regulatory documentation for home oxygen)  Patient Saturations on Room Air at Rest = 83%  Patient Saturations on Room Air while Ambulating = 80%  Patient Saturations on 3 Liters of oxygen while Ambulating = 88%  Please briefly explain why patient needs home oxygen:

## 2024-11-30 NOTE — Progress Notes (Signed)
 Physical Therapy Treatment Patient Details Name: Anne [name] MRN: [id number] DOB: [birth year] Today's Date: 11/30/2024   History of Present Illness Anne [name] 84 year old woman PMH metastatic breast cancer followed by [facility], with cough x 10 days, seen by urgent care 12/29 and prescribed doxycycline  and prednisone .  CT chest showed multifocal pneumonia, no PE.  Admitted for pneumonia, acute hypoxic respiratory failure.    PT Comments  Pt requesting to transfer to [facility] upon PT arrival, completes at [facility] with improved safety awareness; completes pericare at set-up assist. Pt declined additional ambulation today as she already walked with OT earlier. Reviewed energy conservation strategies, breathing exercises, management of O2 tube at home, and need for rest breaks when moving around her home; pt and spouse understanding of all education. Pt will continue to benefit from therapy while admitted, recommend HHPT at discharge.     If plan is discharge home, recommend the following: A little help with walking and/or transfers;A little help with bathing/dressing/bathroom;Help with stairs or ramp for entrance;Assist for transportation;Assistance with cooking/housework   Can travel by private [employer] (2 wheels)    Recommendations for Other Services       Precautions / Restrictions Precautions Precautions: Fall Recall of Precautions/Restrictions: Impaired Precaution/Restrictions Comments: 3-4L O2 needed with mobility Restrictions Weight Bearing Restrictions Per Provider Order: No     Mobility  Bed Mobility               General bed mobility comments: pt seated in recliner upon PT arrival    Transfers Overall transfer level: Needs assistance   Transfers: Sit to/from Stand, Bed to chair/wheelchair/BSC Sit to Stand: Contact guard assist Stand pivot transfers: Contact guard assist         General transfer comment: CGA for  sit<>stand and stand pivot transfer to [facility] for toileting, noted to need minimal cues for sequencing which is an improvement from previous sessions    Ambulation/Gait Ambulation/Gait assistance:  (declined walking in PT session since she walked with OT)                 Stairs             Wheelchair Mobility     Tilt Bed    Modified Rankin (Stroke Patients Only)       Balance Overall balance assessment: Needs assistance, History of Falls Sitting-balance support: No upper extremity supported, Feet unsupported Sitting balance-Leahy Scale: Good     Standing balance support: During functional activity, Bilateral upper extremity supported Standing balance-Leahy Scale: Fair Standing balance comment: BUE support on recliner or BSC armrests while completing transfers                            Communication Communication Communication: No apparent difficulties  Cognition Arousal: Alert Behavior During Therapy: WFL for tasks assessed/performed   PT - Cognitive impairments: History of cognitive impairments                         Following commands: Impaired Following commands impaired: Follows one step commands inconsistently    Cueing Cueing Techniques: Verbal cues, Gestural cues  Exercises      General Comments General comments (skin integrity, edema, etc.): spO2 88-91% with activity on 3L, Reviewed O2 monitoring at home, energy conservation, rest breaks, safe mobility at home with O2 tube present      Pertinent  Vitals/Pain Pain Assessment Pain Assessment: No/denies pain    Home Living                          Prior Function            PT Goals (current goals can now be found in the care plan section) Acute Rehab PT Goals Patient Stated Goal: go home PT Goal Formulation: With patient/family Time For Goal Achievement: 12/02/24 Potential to Achieve Goals: Good Progress towards PT goals: Progressing toward goals     Frequency    Min 3X/week      PT Plan      Co-evaluation              AM-PAC PT 6 Clicks Mobility   Outcome Measure  Help needed turning from your back to your side while in a flat bed without using bedrails?: None Help needed moving from lying on your back to sitting on the side of a flat bed without using bedrails?: None Help needed moving to and from a bed to a chair (including a wheelchair)?: A Little Help needed standing up from a chair using your arms (e.g., wheelchair or bedside chair)?: A Little Help needed to walk in hospital room?: A Little Help needed climbing 3-5 steps with a railing? : Total 6 Click Score: 18    End of Session Equipment Utilized During Treatment: Oxygen Activity Tolerance: Patient tolerated treatment well Patient left: in chair;with call bell/phone within reach;with family/visitor present Nurse Communication: Mobility status PT Visit Diagnosis: Unsteadiness on feet (R26.81);Difficulty in walking, not elsewhere classified (R26.2)     Time: 8464-8453 PT Time Calculation (min) (ACUTE ONLY): 11 min  Charges:    $Therapeutic Activity: 8-22 mins                       Isaiah DEL. Cameo Shewell, PT, DPT   [employer] 11/30/2024, 3:51 PM

## 2024-11-30 NOTE — Progress Notes (Signed)
 Occupational Therapy Treatment Patient Details Name: Anne [name] MRN: [id number] DOB: [birth year] Today's Date: 11/30/2024   History of present illness Anne [name] 84 year old woman PMH metastatic breast cancer followed by [facility], with cough x 10 days, seen by urgent care 12/29 and prescribed doxycycline  and prednisone .  CT chest showed multifocal pneumonia, no PE.  Admitted for pneumonia, acute hypoxic respiratory failure.   OT comments  Patient seen in order to increase overall activity tolerance and functional mobility. Patient up in the chair upon OT arrival, on 3L O2. Patient willing to attempt ambulation, requiring max cues for sequencing and hand placement on RW. Patient requiring 4L O2 at satting at 90 to 91% during ambulation. Patient taking one seated rest break, and needing cues for pursed lip breathing. Patient unable to navigate back to the room without assist (nearly walking into another patient's room) and returning to seated position. Goals updated at end of session. Patient able to return to 3L once seated. OT recommendation remains appropriate; will continue to follow acutely.       If plan is discharge home, recommend the following:  Assistance with cooking/housework;Help with stairs or ramp for entrance;Assist for transportation;A lot of help with bathing/dressing/bathroom;A little help with walking and/or transfers   Equipment Recommendations  Tub/shower bench    Recommendations for Other Services      Precautions / Restrictions Precautions Precautions: Fall Recall of Precautions/Restrictions: Impaired Precaution/Restrictions Comments: 3L O2 Restrictions Weight Bearing Restrictions Per Provider Order: No       Mobility Bed Mobility               General bed mobility comments: up in recliner upon OT entry    Transfers Overall transfer level: Needs assistance Equipment used: Rolling walker (2 wheels) Transfers: Sit to/from Stand Sit to Stand: Min  assist           General transfer comment: max cues for hand placement and RW navigation     Balance Overall balance assessment: Needs assistance, History of Falls Sitting-balance support: No upper extremity supported, Feet unsupported Sitting balance-Leahy Scale: Good     Standing balance support: Bilateral upper extremity supported, During functional activity, Reliant on assistive device for balance Standing balance-Leahy Scale: Fair Standing balance comment: can stand statically without assist                           ADL either performed or assessed with clinical judgement   ADL Overall ADL's : Needs assistance/impaired                     Lower Body Dressing: Sitting/lateral leans;Contact guard assist   Toilet Transfer: Minimal assistance;Ambulation;Rolling walker (2 wheels) Toilet Transfer Details (indicate cue type and reason): on 4L o2         Functional mobility during ADLs: Minimal assistance;Cueing for safety;Cueing for sequencing;Rolling walker (2 wheels) General ADL Comments: Patient seen in order to increase overall activity tolerance and functional mobility. Patient up in the chair upon OT arrival, on 3L O2. Patient willing to attempt ambulation, requiring max cues for sequencing and hand placement on RW. Patient requiring 4L O2 at satting at 90 to 91% during ambulation. Patient taking one seated rest break, and needing cues for pursed lip breathing. Patient unable to navigate back to the room without assist (nearly walking into another patient's room) and returning to seated position. Patient able to return to 3L once seated. OT recommendation remains appropriate; will  continue to follow acutely.    Extremity/Trunk Assessment Upper Extremity Assessment Upper Extremity Assessment: Generalized weakness            Vision       Perception Perception Perception: Not tested   Praxis Praxis Praxis: Not tested   Communication  Communication Communication: No apparent difficulties   Cognition Arousal: Alert Behavior During Therapy: WFL for tasks assessed/performed Cognition: History of cognitive impairments             OT - Cognition Comments: dementia at baseline, decreased command following, decreased STM deficits                 Following commands: Impaired Following commands impaired: Follows one step commands inconsistently      Cueing   Cueing Techniques: Verbal cues  Exercises      Shoulder Instructions       General Comments      Pertinent Vitals/ Pain       Pain Assessment Pain Assessment: No/denies pain  Home Living                                          Prior Functioning/Environment              Frequency  Min 2X/week        Progress Toward Goals  OT Goals(current goals can now be found in the care plan section)  Progress towards OT goals: Progressing toward goals  Acute Rehab OT Goals Patient Stated Goal: to get better OT Goal Formulation: With patient/family Time For Goal Achievement: 12/14/24 Potential to Achieve Goals: Good ADL Goals Pt Will Perform Grooming: with modified independence;standing Pt Will Perform Lower Body Dressing: with modified independence;sit to/from stand Pt Will Transfer to Toilet: with modified independence;ambulating Pt Will Perform Toileting - Clothing Manipulation and hygiene: with modified independence;sit to/from stand  Plan      Co-evaluation                 AM-PAC OT 6 Clicks Daily Activity     Outcome Measure   Help from another person eating meals?: None Help from another person taking care of personal grooming?: A Little Help from another person toileting, which includes using toliet, bedpan, or urinal?: A Lot Help from another person bathing (including washing, rinsing, drying)?: A Lot Help from another person to put on and taking off regular upper body clothing?: A Little Help  from another person to put on and taking off regular lower body clothing?: A Little 6 Click Score: 17    End of Session Equipment Utilized During Treatment: Gait belt;Rolling walker (2 wheels);Oxygen  OT Visit Diagnosis: Unsteadiness on feet (R26.81);Other abnormalities of gait and mobility (R26.89);Muscle weakness (generalized) (M62.81)   Activity Tolerance Patient tolerated treatment well   Patient Left in chair;with call bell/phone within reach;with family/visitor present   Nurse Communication Mobility status        Time: 8663-8643 OT Time Calculation (min): 20 min  Charges: OT General Charges $OT Visit: 1 Visit OT Treatments $Self Care/Home Management : 8-22 mins  Ronal Gift E. Latreshia Beauchaine, OTR/L Acute Rehabilitation Services [phone]   Ronal Gift Salt 11/30/2024, 2:27 PM

## 2024-12-01 ENCOUNTER — Other Ambulatory Visit (HOSPITAL_COMMUNITY): Payer: Self-pay

## 2024-12-01 DIAGNOSIS — J4520 Mild intermittent asthma, uncomplicated: Secondary | ICD-10-CM

## 2024-12-01 DIAGNOSIS — C7981 Secondary malignant neoplasm of breast: Secondary | ICD-10-CM

## 2024-12-01 DIAGNOSIS — J188 Other pneumonia, unspecified organism: Principal | ICD-10-CM

## 2024-12-01 DIAGNOSIS — J984 Other disorders of lung: Secondary | ICD-10-CM

## 2024-12-01 LAB — BASIC METABOLIC PANEL WITH GFR
Anion gap: 11 (ref 5–15)
BUN: 18 mg/dL (ref 8–23)
CO2: 29 mmol/L (ref 22–32)
Calcium: 9.4 mg/dL (ref 8.9–10.3)
Chloride: 99 mmol/L (ref 98–111)
Creatinine, Ser: 0.57 mg/dL (ref 0.44–1.00)
GFR, Estimated: >60 mL/min
Glucose, Bld: 96 mg/dL (ref 70–99)
Potassium: 3.7 mmol/L (ref 3.5–5.1)
Sodium: 139 mmol/L (ref 135–145)

## 2024-12-01 LAB — CBC
HCT: 33.6 % — ABNORMAL LOW (ref 36.0–46.0)
Hemoglobin: 10.8 g/dL — ABNORMAL LOW (ref 12.0–15.0)
MCH: 29.8 pg (ref 26.0–34.0)
MCHC: 32.1 g/dL (ref 30.0–36.0)
MCV: 92.6 fL (ref 80.0–100.0)
Platelets: 355 K/uL (ref 150–400)
RBC: 3.63 MIL/uL — ABNORMAL LOW (ref 3.87–5.11)
RDW: 15.1 % (ref 11.5–15.5)
WBC: 15.3 K/uL — ABNORMAL HIGH (ref 4.0–10.5)
nRBC: 0 % (ref 0.0–0.2)

## 2024-12-01 LAB — GLUCOSE, CAPILLARY
Glucose-Capillary: 89 mg/dL (ref 70–99)
Glucose-Capillary: 142 mg/dL — ABNORMAL HIGH (ref 70–99)
Glucose-Capillary: 145 mg/dL — ABNORMAL HIGH (ref 70–99)

## 2024-12-01 MED ORDER — BUDESONIDE 0.5 MG/2ML IN SUSP
0.5000 mg | Freq: Two times a day (BID) | RESPIRATORY_TRACT | 1 refills | Status: AC
  Filled 2024-12-01: qty 120, 30d supply, fill #0

## 2024-12-01 MED ORDER — IPRATROPIUM-ALBUTEROL 0.5-2.5 (3) MG/3ML IN SOLN
RESPIRATORY_TRACT | 1 refills | Status: AC
  Filled 2024-12-01: qty 360, 28d supply, fill #0

## 2024-12-01 MED ORDER — BENZONATATE 100 MG PO CAPS
100.0000 mg | ORAL_CAPSULE | Freq: Two times a day (BID) | ORAL | 0 refills | Status: AC | PRN
  Filled 2024-12-01: qty 20, 10d supply, fill #0

## 2024-12-01 MED ORDER — POLYETHYLENE GLYCOL 3350 17 GM/SCOOP PO POWD
17.0000 g | Freq: Every day | ORAL | 0 refills | Status: AC | PRN
  Filled 2024-12-01: qty 238, 14d supply, fill #0

## 2024-12-01 MED ORDER — SENNOSIDES 8.8 MG/5ML PO SYRP: 5.0000 mL | ORAL_SOLUTION | Freq: Two times a day (BID) | ORAL | Status: AC

## 2024-12-01 MED ORDER — IPRATROPIUM-ALBUTEROL 0.5-2.5 (3) MG/3ML IN SOLN
3.0000 mL | RESPIRATORY_TRACT | 1 refills | Status: DC | PRN
  Filled 2024-12-01: qty 180, 10d supply, fill #0

## 2024-12-01 MED ORDER — PREDNISONE 10 MG PO TABS
ORAL_TABLET | ORAL | 0 refills | Status: AC
  Filled 2024-12-01: qty 42, 18d supply, fill #0

## 2024-12-01 MED ORDER — MUSCLE RUB 10-15 % EX CREA
1.0000 | TOPICAL_CREAM | Freq: Two times a day (BID) | CUTANEOUS | 0 refills | Status: AC | PRN
  Filled 2024-12-01: qty 35, 18d supply, fill #0

## 2024-12-01 MED ORDER — REVEFENACIN 175 MCG/3ML IN SOLN
175.0000 ug | Freq: Every day | RESPIRATORY_TRACT | 1 refills | Status: DC
  Filled 2024-12-01: qty 90, 30d supply, fill #0

## 2024-12-01 MED ORDER — ARFORMOTEROL TARTRATE 15 MCG/2ML IN NEBU
15.0000 ug | INHALATION_SOLUTION | Freq: Two times a day (BID) | RESPIRATORY_TRACT | 1 refills | Status: AC
  Filled 2024-12-01: qty 120, 30d supply, fill #0

## 2024-12-01 MED ORDER — LIDOCAINE 5 % EX PTCH
1.0000 | MEDICATED_PATCH | CUTANEOUS | 0 refills | Status: AC
  Filled 2024-12-01: qty 30, 30d supply, fill #0

## 2024-12-01 NOTE — Progress Notes (Signed)
 Discharge meds in a secure bag delivered to patient by this RN

## 2024-12-01 NOTE — Discharge Summary (Addendum)
 Physician Discharge Summary  DORALENE GLANZ FMW:[id number] DOB: 11/06/1941 DOA: 11/10/2024  PCP: Domenica Harlene LABOR, MD  Admit date: 11/10/2024 Discharge date: 12/01/2024  Time spent: 60 minutes  Recommendations for Outpatient Follow-up:  Follow-up with Dr. Theophilus, pulmonary in 2 weeks. Follow-up with Domenica Harlene LABOR, MD in 2 weeks.  On follow-up patient will need a basic metabolic profile done to follow-up on electrolytes and renal function.  Patient will need a CBC done to follow-up on counts.  Patient will need repeat ambulatory sats done to assess oxygen needs.   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  Diet recommendation: Soft diet  Filed Weights   11/11/24 0749 11/21/24 0526 11/24/24 0622  Weight: 59 kg 52.7 kg 52.7 kg    History of present illness:  HPI per Dr. Jadine 84 year old woman PMH metastatic breast cancer followed by [facility], with cough x 10 days, seen by urgent care 12/29 and prescribed doxycycline  and prednisone .  Presented to ED 12/31.  CT chest showed multifocal pneumonia, no PE.  Admitted for pneumonia, acute hypoxic respiratory failure.   History obtained from patient and husband at bedside.  Traveled to [place]  last week, did well until she started develop symptoms 12/26 with cough and shortness of breath.  Symptoms worsened over the weekend, complicated by diarrhea from chemotherapy.  Was seen at urgent care 12/29 as above but failed to improve.  Symptoms include fever.  Hospital Course:  #1 acute hypoxic  respiratory failure/multifocal pneumonia/metastatic breast cancer with lung metastases/pneumonitis/COPD - Patient with improving O2 requirement. - Patient noted to have been on as high as 60 L O2 with a FiO2 of 70% when initially transferred to the [facility] service.  - Patient was on PCCM service transferred to [facility] service on 11/24/2024. -Patient improved clinically during the hospitalization with O2 being weaned down such that by day of discharge O2 was down to 93% on 2 L nasal cannula. - Status post completion of course of meropenem . - Chest x-ray 11/23/2024 with interval progression of bilateral interstitial densities. - Patient maintained on Brovana  nebs, Pulmicort  nebs, PPI,yupelri  nebs during the hospitalization with clinical improvement.. - Patient also on IV Solu-Medrol  and subsequently transition to oral prednisone  and will be discharged on the slow prednisone  taper.  -Patient with discharge on oxygen and will need to follow-up with pulmonary and PCP in the outpatient setting.   - On follow-up in the outpatient setting patient will need repeat ambulatory sats done to assess patient's O2 needs.   - Patient was discharged in stable and improved condition with outpatient follow-up with pulmonary and PCP.    2.  Acute metabolic encephalopathy/history of dementia/depression/anxiety -Mental status improved during the hospitalization.. -Likely close to baseline. - Patient has been weaned off Precedex  drip. - She maintained on home regimen fluoxetine . 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 - IV Ativan  as needed. - Noted to have not required Haldol  over the past few days prior to discharge. -  Patient maintained on delirium precautions. - Outpatient follow-up with PCP.   3.  Leukocytosis - Status post completion full course of antibiotics of meropenem .  -Patient on steroids and was on IV  Solu-Medrol  and transitioned to oral prednisone  taper.   - Outpatient follow-up   4.  Anemia of chronic disease - Hemoglobin remained stable at 10.8 by day of discharge.   5.  Thrombocytosis -Felt likely to be reactive. - Resolved.    6.  Backache - Patient maintained on lidocaine  patch, tramadol  as needed.    7.  Hypertension -BP noted to be on the softer side. - Patient maintained on labetalol  as needed. -HCTZ held during the hospitalization. - Labetalol  as needed.    8.  Metastatic breast cancer -Outpatient follow-up with primary oncologist at [facility].   9.  Hypokalemia - Repleted during the hospital.   10.  Constipation - Patient with good results on laxatives. - Patient maintained on bowel regimen of MiraLAX  daily, Senokot-S twice daily during the hospitalization.   - Outpatient follow-up.    11.  Goals of care -Patient noted with overall poor prognosis.  Palliative care was consulted for goals of care. - outpatient follow-up.      Procedures: CT angiogram chest 11/10/2024 2D echo 11/14/2024   Significant Hospital Events: Including procedures, antibiotic start and stop dates in addition to other pertinent events   12/31 Admitted 1/2 Transferred to SDU 1/3 PCCM consulted 1/3 echocardiogram > LVEF 60-65%, no regional wall motion abnormalities, grade 1 diastolic dysfunction, normal RV size and function.  PAP not estimated. 1/5 remains on 1.2 precedex  1/6 off precedex   1/9 remains on BiPAP and high flow nasal cannula 1/12 no acute issues overnight remains on HHFNC, did not require BiPAP overnight  Consultations: PCCM: Dr. Isaiah 11/14/2024 Palliative care: Dr. Clayton 11/25/2024    Discharge Exam: Vitals:   12/01/24 0640 12/01/24 0900  BP: 128/61   Pulse: 96   Resp: 20   Temp: 98 F (36.7 C)   SpO2: 92% 93%    General: NAD Cardiovascular: RRR no murmurs rubs or gallops.  No JVD.  No lower extremity edema. Respiratory: Significantly improved coarse bibasilar  breath sounds.  No wheezing.  Fair air movement.  No use of accessory muscles of respiration.  Speaking in full sentences.  Discharge Instructions   Discharge Instructions     Diet general   Complete by: As directed    Soft diet   Increase activity slowly   Complete by: As directed    Pulmonary Visit   Complete by: As directed    Reason for referral: Other Pulmonary      Allergies as of 12/01/2024       Reactions   Sorbitol  Other (See Comments)   GI Issues   Tomato Diarrhea   Zofran  Other (See Comments)   headache   Advil  [ibuprofen ] Other (See Comments)   Irritates throat.   Cephalexin  Other (See Comments)   dizziness   Diphenhydramine  Hcl Other (See Comments)   nervous and upset does okay w/ cream   Morphine  And Codeine Other (See Comments)   Causes depression   Tramadol  Other (See Comments)   Dizziness   Antihistamines, Chlorpheniramine-type Anxiety   Oxycodone  Anxiety   Confusion with inability to think clearly        Medication List     PAUSE taking these medications    atorvastatin  20 MG tablet Wait to take this until your doctor or other care provider tells you to start  again. Commonly known as: LIPITOR Take 20 mg by mouth daily.   midodrine  2.5 MG tablet Wait to take this until your doctor or other care provider tells you to start again. Commonly known as: PROAMATINE  Take 1 tablet (2.5 mg total) by mouth 3 (three) times daily with meals. (1st dose upon waking around 7:00 or 8:00 AM, 2nd dose at 11:00 AM or 12:00 PM, 3rd dose at 2:00 or 3:00 PM)   potassium chloride  20 MEQ packet Wait to take this until your doctor or other care provider tells you to start again. Commonly known as: Klor-Con  Take 40 mEq by mouth 2 (two) times daily for 7 days.       STOP taking these medications    doxycycline  100 MG capsule Commonly known as: VIBRAMYCIN        TAKE these medications    albuterol  108 (90 Base) MCG/ACT inhaler Commonly known as:  VENTOLIN  HFA Inhale 2 puffs into the lungs every 6 (six) hours as needed for wheezing.   amitriptyline  25 MG tablet Commonly known as: ELAVIL  Take 4 tablets (100 mg total) by mouth at bedtime.   arformoterol  15 MCG/2ML Nebu Commonly known as: BROVANA  Take 2 mLs (15 mcg total) by nebulization 2 (two) times daily.   aspirin  EC 81 MG tablet Take 81 mg by mouth daily.   B-1 PO Take 1 tablet by mouth daily.   B-6 PO Take 1 tablet by mouth daily.   benzonatate  100 MG capsule Commonly known as: TESSALON  Take 1 capsule (100 mg total) by mouth 2 (two) times daily as needed (cough). What changed:  medication strength how much to take when to take this reasons to take this   budesonide  0.5 MG/2ML nebulizer solution Commonly known as: PULMICORT  Take 2 mLs (0.5 mg total) by nebulization 2 (two) times daily.   capivasertib 200 MG tablet Commonly known as: TRUQAP Take 200 mg (1 tablet) twice daily with food, for 4 days followed by 3 days OFF of a 28 day cycle   diltiazem  30 MG tablet Commonly known as: CARDIZEM  As needed What changed:  how much to take how to take this when to take this reasons to take this additional instructions   FLUoxetine  20 MG capsule Commonly known as: PROZAC  Take 3 capsules (60 mg total) by mouth daily. What changed: Another medication with the same name was removed. Continue taking this medication, and follow the directions you see here.   ipratropium-albuterol  0.5-2.5 (3) MG/3ML Soln Commonly known as: DUONEB Take 3 mLs by nebulization 3 (three) times daily for 3 days, THEN 3 mLs every 6 (six) hours as needed. Start taking on: December 01, 2024   lidocaine  5 % Commonly known as: LIDODERM  Place 1 patch onto the skin daily. Remove & Discard patch within 12 hours or as directed by MD Start taking on: December 02, 2024   LORazepam  1 MG tablet Commonly known as: ATIVAN  TAKE 1 TABLET(1 MG) BY MOUTH EVERY 8 HOURS AS NEEDED FOR ANXIETY What changed:  See the new instructions.   Lumify  0.025 % Soln Generic drug: Brimonidine  Tartrate Place 1 drop into both eyes daily.   Muscle Rub 10-15 % Crea Apply 1 Application topically 2 (two) times daily as needed for muscle pain.   pantoprazole  40 MG tablet Commonly known as: PROTONIX  TAKE 1 TABLET(40 MG) BY MOUTH TWICE DAILY BEFORE A MEAL   polyethylene glycol powder 17 GM/SCOOP powder Commonly known as: GLYCOLAX /MIRALAX  Take 17 g by mouth daily as needed for mild  constipation. Dissolve 1 capful (17g) in 4-8 ounces of liquid and take by mouth daily.   predniSONE  10 MG tablet Commonly known as: DELTASONE  Take 4 tablets (40 mg total) by mouth daily before breakfast for 3 days, THEN 3 tablets (30 mg total) daily before breakfast for 5 days, THEN 2 tablets (20 mg total) daily before breakfast for 5 days, THEN 1 tablet (10 mg total) daily before breakfast for 5 days. Start taking on: December 02, 2024 What changed: See the new instructions.   sennosides 8.8 MG/5ML syrup Commonly known as: SENOKOT Take 5 mLs by mouth 2 (two) times daily.   UNABLE TO FIND Take 1,000 mg by mouth daily as needed (pain/fever). Med Name: Tylenol  500 mg/15 ml               Durable Medical Equipment  (From admission, onward)           Start     Ordered   12/01/24 1122  For home use only DME oxygen  Once       Question Answer Comment  Length of Need 6 Months   Mode or (Route) Nasal cannula   Liters per Minute 3   Frequency Continuous (stationary and portable oxygen unit needed)   Oxygen conserving device Yes   Oxygen delivery system: Gas   Oxygen delivery system: Portable concentrator (POC)   Oxygen delivery system: Gas/Tank      12/01/24 1121   12/01/24 0824  For home use only DME 4 wheeled rolling walker with seat  Once       Question:  Patient needs a walker to treat with the following condition  Answer:  Debility   12/01/24 0823   11/30/24 1101  For home use only DME Nebulizer machine  Once        Question Answer Comment  Patient needs a nebulizer to treat with the following condition COPD (chronic obstructive pulmonary disease) (HCC)   Length of Need Lifetime   Additional equipment included Administration kit   Additional equipment included Filter      11/30/24 1100           Allergies[1]  Follow-up Information     Domenica Harlene LABOR, MD. Schedule an appointment as soon as possible for a visit in 2 week(s).   Specialty: Family Medicine Contact information: [address], [address] [place] [address] [phone]         Mannam, Praveen, MD. Schedule an appointment as soon as possible for a visit in 2 week(s).   Specialty: Pulmonary Disease Contact information: [address] [place] [address] [phone]                  The results of significant diagnostics from this hospitalization (including imaging, microbiology, ancillary and laboratory) are listed below for reference.    Significant Diagnostic Studies: DG Chest Port 1 View Result Date: 11/23/2024 CLINICAL DATA:  Respiratory failure. EXAM: PORTABLE CHEST 1 VIEW COMPARISON:  Chest radiograph dated 11/20/2024. FINDINGS: Interval progression of bilateral interstitial densities. No large pleural effusion or pneumothorax. Stable cardiac silhouette no acute osseous pathology. IMPRESSION: Interval progression of bilateral interstitial densities. Electronically Signed   By: Vanetta Chou M.D.   On: 11/23/2024 18:53   DG Chest Port 1 View Result Date: 11/20/2024 EXAM: 1 VIEW(S) XRAY OF THE CHEST 11/20/2024 12:17:00 AM COMPARISON: 11/16/2024 CLINICAL HISTORY: Acute respiratory failure with hypoxemia [facility]) [id number] FINDINGS: LUNGS AND PLEURA: Bilateral interstitial and airspace opacities, slightly  increased on the right since previous. No pleural effusion. No pneumothorax. HEART AND MEDIASTINUM: No acute abnormality of the cardiac and mediastinal silhouettes. Aortic atherosclerosis.  BONES AND SOFT TISSUES: Right axillary surgical clips. No acute osseous abnormality. IMPRESSION: 1. Bilateral interstitial and airspace opacities, slightly increased on the right since previous. Electronically signed by: Elsie Gravely MD 11/20/2024 12:21 AM EST RP Workstation: [id number]   DG Chest Port 1 View Result Date: 11/16/2024 CLINICAL DATA:  Respiratory failure. EXAM: PORTABLE CHEST 1 VIEW COMPARISON:  11/15/2024 FINDINGS: Lungs are hyperexpanded. Diffuse interstitial and granular basilar airspace disease evident. Interval progression of consolidative opacity in the right apex. No substantial pleural effusion. Cardiopericardial silhouette is at upper limits of normal for size. Telemetry leads overlie the chest. IMPRESSION: 1. Interval progression of consolidative opacity in the right apex. 2. Diffuse interstitial and granular basilar airspace disease. Electronically Signed   By: Camellia Candle M.D.   On: 11/16/2024 06:18   DG Chest Port 1 View Result Date: 11/15/2024 EXAM: 1 VIEW(S) XRAY OF THE CHEST 11/15/2024 04:52:00 AM COMPARISON: 11/14/2024 CLINICAL HISTORY: Respiratory failure (HCC) FINDINGS: LUNGS AND PLEURA: Worsening bilateral airspace opacities, right greater than left. Chronic interstitial prominence. Persistent nodular density within the apical segment of the right upper lobe measuring approximately 2 cm. No pleural effusion. No pneumothorax. HEART AND MEDIASTINUM: Aortic atherosclerosis. No acute abnormality of the cardiac and mediastinal silhouettes. BONES AND SOFT TISSUES: Right axillary surgical clips noted. No acute osseous abnormality. IMPRESSION: 1. Worsening bilateral airspace opacities, right greater than left, and chronic interstitial prominence. 2. Persistent nodular density within the apical segment of the right upper lobe measuring approximately 2 cm. Electronically signed by: Waddell Calk MD 11/15/2024 08:09 AM EST RP Workstation: [id number]   ECHOCARDIOGRAM COMPLETE Result  Date: 11/14/2024    ECHOCARDIOGRAM REPORT   Patient Name:   Anne [name] Date of Exam: 11/14/2024 Medical Rec #:  [id number]       Height:       65.0 in Accession #:    [id number]      Weight:       130.0 lb Date of Birth:  October 15, 1941      BSA:          1.647 m Patient Age:    83 years        BP:           145/66 mmHg Patient Gender: F               HR:           76 bpm. Exam Location:  Inpatient Procedure: 2D Echo, Cardiac Doppler and Color Doppler (Both Spectral and Color            Flow Doppler were utilized during procedure). Indications:    Pulmonary Edema  History:        Patient has prior history of Echocardiogram examinations, most                 recent 06/22/2020. COPD.  Sonographer:    Nathanel Devonshire Referring Phys: [id number] Shivangi Lutz P [place]  Sonographer Comments: Echo performed with patient supine and on artificial respirator. IMPRESSIONS  1. Left ventricular ejection fraction, by estimation, is 60 to 65%. Left ventricular ejection fraction by 2D MOD biplane is 58.7 %. The left ventricle has normal function. The left ventricle has no regional wall motion abnormalities. Left ventricular diastolic parameters are consistent with Grade I diastolic dysfunction (impaired relaxation).  2. Right ventricular systolic function is normal.  The right ventricular size is normal. Tricuspid regurgitation signal is inadequate for assessing PA pressure.  3. The mitral valve is normal in structure. Trivial mitral valve regurgitation. No evidence of mitral stenosis.  4. The aortic valve is tricuspid. Aortic valve regurgitation is not visualized. No aortic stenosis is present. Comparison(s): No significant change from prior study. Conclusion(s)/Recommendation(s): Otherwise normal echocardiogram, with minor abnormalities described in the report. FINDINGS  Left Ventricle: Left ventricular ejection fraction, by estimation, is 60 to 65%. Left ventricular ejection fraction by 2D MOD biplane is 58.7 %. The left ventricle has normal  function. The left ventricle has no regional wall motion abnormalities. The left ventricular internal cavity size was normal in size. There is no left ventricular hypertrophy. Left ventricular diastolic parameters are consistent with Grade I diastolic dysfunction (impaired relaxation). Right Ventricle: The right ventricular size is normal. No increase in right ventricular wall thickness. Right ventricular systolic function is normal. Tricuspid regurgitation signal is inadequate for assessing PA pressure. Left Atrium: Left atrial size was normal in size. Right Atrium: Right atrial size was normal in size. Pericardium: There is no evidence of pericardial effusion. Mitral Valve: The mitral valve is normal in structure. Trivial mitral valve regurgitation. No evidence of mitral valve stenosis. Tricuspid Valve: The tricuspid valve is normal in structure. Tricuspid valve regurgitation is not demonstrated. No evidence of tricuspid stenosis. Aortic Valve: The aortic valve is tricuspid. Aortic valve regurgitation is not visualized. No aortic stenosis is present. Aortic valve mean gradient measures 4.0 mmHg. Aortic valve peak gradient measures 8.1 mmHg. Aortic valve area, by VTI measures 2.43 cm. Pulmonic Valve: The pulmonic valve was normal in structure. Pulmonic valve regurgitation is not visualized. No evidence of pulmonic stenosis. Aorta: The aortic root and ascending aorta are structurally normal, with no evidence of dilitation. Venous: The inferior vena cava was not well visualized. IAS/Shunts: No atrial level shunt detected by color flow Doppler.  LEFT VENTRICLE PLAX 2D                        Biplane EF (MOD) LVIDd:         3.90 cm         LV Biplane EF:   Left LVIDs:         2.70 cm                          ventricular LV PW:         0.90 cm                          ejection LV IVS:        1.00 cm                          fraction by LVOT diam:     2.10 cm                          2D MOD LV SV:         67                                biplane is LV SV Index:   41  58.7 %. LVOT Area:     3.46 cm LV IVRT:       98 msec         Diastology                                LV e' medial:    7.18 cm/s                                LV E/e' medial:  9.6 LV Volumes (MOD)               LV e' lateral:   6.85 cm/s LV vol d, MOD    36.5 ml       LV E/e' lateral: 10.1 A2C: LV vol d, MOD    46.8 ml A4C: LV vol s, MOD    15.2 ml A2C: LV vol s, MOD    20.6 ml A4C: LV SV MOD A2C:   21.3 ml LV SV MOD A4C:   46.8 ml LV SV MOD BP:    25.1 ml RIGHT VENTRICLE             IVC RV Basal diam:  2.80 cm     IVC diam: 2.00 cm RV S prime:     11.40 cm/s TAPSE (M-mode): 1.8 cm      PULMONARY VEINS                             Diastolic Velocity: 44.10 cm/s                             S/D Velocity:       1.00                             Systolic Velocity:  45.40 cm/s LEFT ATRIUM             Index        RIGHT ATRIUM           Index LA diam:        3.00 cm 1.82 cm/m   RA Area:     10.60 cm LA Vol (A2C):   28.8 ml 17.48 ml/m  RA Volume:   23.00 ml  13.96 ml/m LA Vol (A4C):   15.7 ml 9.53 ml/m LA Biplane Vol: 22.6 ml 13.72 ml/m  AORTIC VALVE                    PULMONIC VALVE AV Area (Vmax):    2.49 cm     PV Vmax:       1.12 m/s AV Area (Vmean):   2.50 cm     PV Peak grad:  5.0 mmHg AV Area (VTI):     2.43 cm AV Vmax:           142.00 cm/s AV Vmean:          94.000 cm/s AV VTI:            0.275 m AV Peak Grad:      8.1 mmHg AV Mean Grad:      4.0 mmHg LVOT Vmax:         102.00 cm/s LVOT Vmean:  67.800 cm/s LVOT VTI:          0.193 m LVOT/AV VTI ratio: 0.70  AORTA Ao Root diam: 3.00 cm Ao Asc diam:  3.10 cm MITRAL VALVE MV Area (PHT): 4.15 cm    SHUNTS MV E velocity: 69.00 cm/s  Systemic VTI:  0.19 m MV A velocity: 96.40 cm/s  Systemic Diam: 2.10 cm MV E/A ratio:  0.72 Georganna Archer Electronically signed by Georganna Archer Signature Date/Time: 11/14/2024/2:39:50 PM    Final    DG Chest Port 1 View Result Date:  11/14/2024 EXAM: 1 VIEW(S) XRAY OF THE CHEST 11/14/2024 02:53:00 AM COMPARISON: 11/13/2024. CLINICAL HISTORY: Respiratory distress. FINDINGS: LUNGS AND PLEURA: Diffuse interstitial prominence and airspace disease. This could reflect edema or infection. This is similar to the prior study. No pleural effusion. No pneumothorax. HEART AND MEDIASTINUM: The heart is normal in size. Aortic atherosclerosis is present. BONES AND SOFT TISSUES: No acute osseous abnormality. IMPRESSION: 1. Diffuse interstitial prominence and airspace disease, possibly reflecting edema or infection. Electronically signed by: Franky Crease MD 11/14/2024 02:56 AM EST RP Workstation: [id number]   DG CHEST PORT 1 VIEW Result Date: 11/13/2024 CLINICAL DATA:  Hypoxia. EXAM: PORTABLE CHEST 1 VIEW COMPARISON:  Radiograph and CT 11/10/2024 FINDINGS: Increase in diffuse interstitial and peribronchial thickening. Streaky left lung base opacity, slightly worsened. Right apical opacity is also worse. The heart is normal in size. No pleural effusion or pneumothorax. IMPRESSION: 1. Increase in diffuse interstitial and peribronchial thickening, this may be bronchitic or pulmonary edema. 2. Right apical and streaky left lung base opacities have mildly worsened. Electronically Signed   By: Andrea Gasman M.D.   On: 11/13/2024 14:04   CT Angio Chest PE W and/or Wo Contrast Result Date: 11/10/2024 EXAM: CTA CHEST 11/10/2024 06:08:10 PM TECHNIQUE: CTA of the chest was performed after the administration of intravenous contrast. Multiplanar reformatted images are provided for review. MIP images are provided for review. Automated exposure control, iterative reconstruction, and/or weight based adjustment of the mA/kV was utilized to reduce the radiation dose to as low as reasonably achievable. COMPARISON: None available. CLINICAL HISTORY: Pulmonary embolism (PE) suspected, high prob; ca, pleuritic chest pain, recent travel. FINDINGS: PULMONARY ARTERIES: Pulmonary  arteries are adequately opacified for evaluation. The central pulmonary arteries are enlarged in keeping with changes of pulmonary arterial hypertension. No pulmonary embolism. MEDIASTINUM: Moderate multivessel coronary artery calcification. Clinical cardiac size within normal limits. No pericardial effusion. Mild atherosclerotic calcification within the thoracic aorta. No aortic aneurysm. LYMPH NODES: Pathologic right hilar adenopathy noted with the index lymph node measuring 12 mm in short axis diameter. No mediastinal or axillary lymphadenopathy. LUNGS AND PLEURA: Moderate emphysema. Multifocal consolidation within the right apex and left lower lobe with associated airway inflammation compatible with multifocal bronchopneumonia. Infiltrate within the left lower lobe is particularly nodular and follow up CT imaging in 3 months is recommended to document resolution can exclude the presence of an underlying mass. No pneumothorax or pleural effusion. UPPER ABDOMEN: Limited images of the upper abdomen are unremarkable. SOFT TISSUES AND BONES: Surgical changes of the right mastectomy and implant reconstruction are noted. IMPRESSION: 1. No pulmonary embolism. 2. Multifocal bronchopneumonia involving the right apex and left lower lobe, with the left lower lobe infiltrate being particularly nodular; follow-up CT in 3 months is recommended to document resolution and exclude an underlying mass. 3. Pathologic right hilar adenopathy measuring 12 mm in short axis. While this may be simply reactive, close attention on follow-up imaging is warranted to document resolution.  4. Enlarged central pulmonary arteries consistent with pulmonary arterial hypertension. 5. Moderate emphysema. 6. Moderate multivessel coronary artery calcification. 7. Postsurgical changes of right mastectomy with implant reconstruction. Electronically signed by: Dorethia Molt MD 11/10/2024 07:41 PM EST RP Workstation: [id number]   DG Chest 2 View Result  Date: 11/10/2024 EXAM: 2 VIEW(S) XRAY OF THE CHEST 11/10/2024 05:19:00 PM COMPARISON: 06/22/2024 CLINICAL HISTORY: chest pain FINDINGS: LUNGS AND PLEURA: Medial right upper lobe patchy opacities have increased. There are also new patchy opacities in the left lung base. No pleural effusion. No pneumothorax. HEART AND MEDIASTINUM: No acute abnormality of the cardiac and mediastinal silhouettes. BONES AND SOFT TISSUES: Surgical clips in right axilla. No acute osseous abnormality. IMPRESSION: 1. Increased medial right upper lobe patchy opacities and new patchy opacities in the left lung base concerning for multifocal infection. Recommend follow-up PA and lateral chest x-ray in 4 to 6 weeks to confirm resolution. Electronically signed by: Greig Pique MD 11/10/2024 06:23 PM EST RP Workstation: [id number]    Microbiology: No results found for this or any previous visit (from the past 240 hours).   Labs: Basic Metabolic Panel: Recent Labs  Lab 11/25/24 0814 11/26/24 0843 11/27/24 1117 11/28/24 0700 11/29/24 0528 11/30/24 0901 12/01/24 0952  NA 139   < > 139 139 136 136 139  K 3.2*   < > 4.0 4.6 3.7 3.7 3.7  CL 99   < > 100 101 98 99 99  CO2 28   < > 26 31 31 27 29   GLUCOSE 83   < > 180* 90 84 105* 96  BUN 19   < > 22 18 19 19 18   CREATININE 0.50   < > 0.55 0.56 0.50 0.53 0.57  CALCIUM  8.6*   < > 9.0 9.3 9.4 9.3 9.4  MG 2.4  --   --   --   --   --   --   PHOS 3.3  --   --   --   --   --   --    < > = values in this interval not displayed.   Liver Function Tests: No results for input(s): AST, ALT, ALKPHOS, BILITOT, PROT, ALBUMIN in the last 168 hours. No results for input(s): LIPASE, AMYLASE in the last 168 hours. No results for input(s): AMMONIA in the last 168 hours. CBC: Recent Labs  Lab 11/25/24 0814 11/26/24 0843 11/27/24 1117 11/28/24 0700 11/29/24 0528 11/30/24 0901 12/01/24 0952  WBC 14.0* 12.9* 13.7* 12.6* 13.5* 13.8* 15.3*  NEUTROABS 10.7* 9.8* 12.5*  9.1*  --   --   --   HGB 10.1* 10.6* 10.6* 10.7* 10.1* 10.4* 10.8*  HCT 32.0* 32.7* 33.2* 33.8* 31.2* 32.0* 33.6*  MCV 92.5 92.1 93.5 92.3 91.2 91.2 92.6  PLT 451* 430* 434* 414* 386 355 355   Cardiac Enzymes: No results for input(s): CKTOTAL, CKMB, CKMBINDEX, TROPONINI in the last 168 hours. BNP: BNP (last 3 results) No results for input(s): BNP in the last 8760 hours.  ProBNP (last 3 results) Recent Labs    11/10/24 1707 11/14/24 0843 11/20/24 0150  PROBNP 520.0* 429.0* 328.0*    CBG: Recent Labs  Lab 11/30/24 0748 11/30/24 1217 11/30/24 1634 11/30/24 2148 12/01/24 0730  GLUCAP 88 135* 164* 116* 89       Signed:  Toribio Hummer MD.  [facility] 12/01/2024, 11:55 AM        [1]  Allergies Allergen Reactions   Sorbitol  Other (See Comments)    GI Issues  Tomato Diarrhea   Zofran  Other (See Comments)    headache   Advil  [Ibuprofen ] Other (See Comments)    Irritates throat.   Cephalexin  Other (See Comments)    dizziness   Diphenhydramine  Hcl Other (See Comments)    nervous and upset does okay w/ cream   Morphine  And Codeine Other (See Comments)    Causes depression   Tramadol  Other (See Comments)    Dizziness   Antihistamines, Chlorpheniramine-Type Anxiety   Oxycodone  Anxiety    Confusion with inability to think clearly

## 2024-12-01 NOTE — TOC Transition Note (Signed)
 Transition of Care [facility]) - Discharge Note   Patient Details  Name: Anne [name] MRN: [id number] Date of Birth: 1941-03-20  Transition of Care [facility]) CM/SW Contact:  Heather DELENA Saltness, LCSW Phone Number: 12/01/2024, 12:45 PM   Clinical Narrative:    Pt discharging home today. CSW met with pt and spouse at bedside to discuss discharge planning. Pt agreeable to [facility] PT/OT upon discharge, denies [facility] agency preference. HH PT/OT services set up with [facility]. Pt recommended for home oxygen, nebulizer, and RW. Pt agreeable, denies DME agency preference. Home oxygen, travel tank, RW, and nebulizer ordered through [facility] at [place]. Travel tank and RW to be delivered to bedside; home oxygen and nebulizer to be delivered to home address. HH orders placed. Pt's spouse, Elisabet Gutzmer, to transport pt home upon discharge. No further TOC needs at this time.   Final next level of care: Home w Home Health Services Barriers to Discharge: Barriers Resolved   Patient Goals and CMS Choice Patient states their goals for this hospitalization and ongoing recovery are:: To return home CMS Medicare.gov Compare Post Acute Care list provided to:: Patient Choice offered to / list presented to : Patient [place] ownership interest in [facility].provided to:: Patient    Discharge Placement  Home              Patient to be transferred to facility by: Spouse Name of family member notified: Clela Hagadorn Patient and family notified of of transfer: 12/01/24  Discharge Plan and Services Additional resources added to the After Visit Summary for  [facility] In-house Referral: NA Discharge Planning Services: CM Consult Post Acute Care Choice: NA          DME Arranged: Oxygen, Nebulizer machine, Walker rolling DME Agency: [employer] Date DME Agency Contacted: 12/01/24 Time DME Agency Contacted: 1243 Representative spoke with at DME Agency: [place] HH Arranged: PT, OT HH Agency: [facility] Date [facility] Agency Contacted: 12/01/24 Time HH Agency Contacted: 1244 Representative spoke with at [facility] Agency: Referral accepted in the hub  Social Drivers of Health (SDOH) Interventions SDOH Screenings   Food Insecurity: No Food Insecurity (11/11/2024)  Housing: Low Risk (11/11/2024)  Transportation Needs: No Transportation Needs (11/11/2024)  Utilities: Not At Risk (11/11/2024)  Alcohol Screen: Low Risk (07/30/2024)  Depression (PHQ2-9): Low Risk (07/30/2024)  Financial Resource Strain: Low Risk (09/15/2024)  Recent Concern: Financial Resource Strain - Medium Risk (08/19/2024)  Physical Activity: Insufficiently Active (09/15/2024)  Social Connections: Moderately Integrated (11/11/2024)  Recent Concern: Social Connections - Moderately Isolated (09/15/2024)  Stress: No Stress Concern Present (09/15/2024)  Recent Concern: Stress - Stress Concern Present (08/19/2024)  Tobacco Use: Medium Risk (11/11/2024)  Health Literacy: Adequate Health Literacy (07/30/2024)     Readmission Risk Interventions    12/01/2024   12:42 PM 11/13/2024    2:23 PM  Readmission Risk Prevention Plan  Transportation Screening Complete Complete  PCP or Specialist Appt within 3-5 Days  Complete  HRI or Home Care Consult  Complete  Social Work Consult for Recovery Care Planning/Counseling  Complete  Palliative Care Screening  Complete  Medication Review Oceanographer) Complete Complete  PCP or Specialist appointment within 3-5 days of discharge Complete   HRI or Home Care Consult Complete   SW Recovery Care/Counseling Consult Complete   Palliative Care Screening Not Applicable   Skilled Nursing Facility Not Applicable      Signed: Heather Saltness, MSW, LCSW Clinical Social Worker Inpatient Care Management 12/01/2024 12:49 PM

## 2024-12-01 NOTE — Progress Notes (Signed)
SATURATION QUALIFICATIONS: (This note is used to comply with regulatory documentation for home oxygen)  Patient Saturations on Room Air at Rest = 90%  Patient Saturations on Room Air while Ambulating = 86%  Patient Saturations on 2 Liters of oxygen while Ambulating = 91%  Please briefly explain why patient needs home oxygen: 

## 2024-12-01 NOTE — Plan of Care (Signed)
" °  Problem: Education: Goal: Knowledge of General Education information will improve Description: Including pain rating scale, medication(s)/side effects and non-pharmacologic comfort measures Outcome: Progressing   Problem: Health Behavior/Discharge Planning: Goal: Ability to manage health-related needs will improve Outcome: Progressing   Problem: Clinical Measurements: Goal: Ability to maintain clinical measurements within normal limits will improve Outcome: Progressing Goal: Will remain free from infection Outcome: Progressing Goal: Diagnostic test results will improve Outcome: Progressing Goal: Respiratory complications will improve Outcome: Progressing Note: Was able to decrease O2 to 1 L overnight with O2 saturations between 93 and 95% Goal: Cardiovascular complication will be avoided Outcome: Progressing   Problem: Activity: Goal: Risk for activity intolerance will decrease Outcome: Progressing   Problem: Nutrition: Goal: Adequate nutrition will be maintained Outcome: Progressing   Problem: Coping: Goal: Level of anxiety will decrease Outcome: Progressing   Problem: Elimination: Goal: Will not experience complications related to bowel motility Outcome: Progressing Goal: Will not experience complications related to urinary retention Outcome: Progressing   Problem: Pain Managment: Goal: General experience of comfort will improve and/or be controlled Outcome: Progressing   Problem: Safety: Goal: Ability to remain free from injury will improve Outcome: Progressing   Problem: Skin Integrity: Goal: Risk for impaired skin integrity will decrease Outcome: Progressing   Problem: Clinical Measurements: Goal: Ability to maintain a body temperature in the normal range will improve Outcome: Progressing   Problem: Respiratory: Goal: Ability to maintain adequate ventilation will improve Outcome: Progressing Goal: Ability to maintain a clear airway will  improve Outcome: Progressing   Problem: Education: Goal: Ability to describe self-care measures that may prevent or decrease complications (Diabetes Survival Skills Education) will improve Outcome: Progressing Goal: Individualized Educational Video(s) Outcome: Progressing   Problem: Coping: Goal: Ability to adjust to condition or change in health will improve Outcome: Progressing   Problem: Fluid Volume: Goal: Ability to maintain a balanced intake and output will improve Outcome: Progressing   Problem: Health Behavior/Discharge Planning: Goal: Ability to identify and utilize available resources and services will improve Outcome: Progressing Goal: Ability to manage health-related needs will improve Outcome: Progressing   Problem: Metabolic: Goal: Ability to maintain appropriate glucose levels will improve Outcome: Progressing   Problem: Nutritional: Goal: Maintenance of adequate nutrition will improve Outcome: Progressing Goal: Progress toward achieving an optimal weight will improve Outcome: Progressing   Problem: Skin Integrity: Goal: Risk for impaired skin integrity will decrease Outcome: Progressing   Problem: Tissue Perfusion: Goal: Adequacy of tissue perfusion will improve Outcome: Progressing   "

## 2024-12-02 ENCOUNTER — Telehealth: Payer: Self-pay

## 2024-12-02 ENCOUNTER — Telehealth: Payer: Self-pay | Admitting: Family Medicine
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
## 2024-12-02 NOTE — Transitions of Care (Post Inpatient/ED Visit) (Signed)
" ° °  12/02/2024  Name: Anne [name] MRN: [id number] DOB: 02-19-1941  Today's TOC FU Call Status: Today's TOC FU Call Status:: Unsuccessful Call (1st Attempt) Unsuccessful Call (1st Attempt) Date: 12/02/24  Attempted to reach the patient regarding the most recent Inpatient/ED visit.  Follow Up Plan: Additional outreach attempts will be made to reach the patient to complete the Transitions of Care (Post Inpatient/ED visit) call.   Shona Prow RN, CCM [place]  VBCI-Population Health RN Care Manager [phone]  "

## 2024-12-02 NOTE — Telephone Encounter (Unsigned)
 Copied from CRM #[id number]. Topic: Clinical - Medical Advice >> Dec 02, 2024  5:01 PM Terri G wrote: Reason for CRM: Nat from [facility] calling regarding patient was discharged from the hospital yesterday (she will get her son to call tomorrow to schedule for follow up) and she will need a child psychotherapist, PT, OT, Home health aid for bathing and a nurse. She wanted to let Dr.[name] know.

## 2024-12-03 NOTE — Telephone Encounter (Signed)
 Looks like Pt is going home w/ palliative care.

## 2024-12-08 DIAGNOSIS — Z7189 Other specified counseling: Secondary | ICD-10-CM | POA: Insufficient documentation

## 2024-12-08 DIAGNOSIS — R918 Other nonspecific abnormal finding of lung field: Secondary | ICD-10-CM | POA: Insufficient documentation

## 2024-12-08 DIAGNOSIS — Z09 Encounter for follow-up examination after completed treatment for conditions other than malignant neoplasm: Secondary | ICD-10-CM | POA: Insufficient documentation

## 2024-12-08 NOTE — Progress Notes (Unsigned)
" ° °  Acute Office Visit  Subjective:     Patient ID: MEERAB MASELLI, female    DOB: 1941/04/18, 84 y.o.   MRN: [id number]  No chief complaint on file.   Palliative care*** involved??  HPI Patient is in today for hospital FU visit  PMHx- COPD, metastatic breast cancer,   Recent hospitalization Admission date:11/10/2024 Discharge date: 12/01/2024 CC: SOB Diagnosis: Pneumonia, acute hypoxic respiratory failure  She was seen by urgent care 12/29 and prescribed doxycycline  and prednisone . Presented to ED 12/30. CT chest showed multifocal pneumonia, no PE. Admitted for pneumonia, acute hypoxic respiratory failure.   CT Chest 11/10/2024 IMPRESSION: 1. No pulmonary embolism. 2. Multifocal bronchopneumonia involving the right apex and left lower lobe, with the left lower lobe infiltrate being particularly nodular; follow-up CT in 3 months is recommended to document resolution and exclude an underlying mass. 3. Pathologic right hilar adenopathy measuring 12 mm in short axis. While this may be simply reactive, close attention on follow-up imaging is warranted to document resolution. 4. Enlarged central pulmonary arteries consistent with pulmonary arterial hypertension. 5. Moderate emphysema. 6. Moderate multivessel coronary artery calcification. 7. Postsurgical changes of right mastectomy with implant reconstruction.   ROS      Objective:    There were no vitals taken for this visit. {Vitals History (Optional):23777}  Physical Exam  No results found for any visits on 12/09/24.      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   No follow-ups on file.  Harlene LITTIE Jolly, NP   "

## 2024-12-09 ENCOUNTER — Inpatient Hospital Stay: Admitting: Family Medicine

## 2024-12-09 ENCOUNTER — Ambulatory Visit (INDEPENDENT_AMBULATORY_CARE_PROVIDER_SITE_OTHER): Admitting: Student

## 2024-12-09 ENCOUNTER — Encounter: Payer: Self-pay | Admitting: Student

## 2024-12-09 DIAGNOSIS — R918 Other nonspecific abnormal finding of lung field: Secondary | ICD-10-CM

## 2024-12-09 DIAGNOSIS — Z09 Encounter for follow-up examination after completed treatment for conditions other than malignant neoplasm: Secondary | ICD-10-CM

## 2024-12-09 DIAGNOSIS — J188 Other pneumonia, unspecified organism: Secondary | ICD-10-CM

## 2024-12-09 DIAGNOSIS — Z7189 Other specified counseling: Secondary | ICD-10-CM

## 2024-12-09 DIAGNOSIS — C50911 Malignant neoplasm of unspecified site of right female breast: Secondary | ICD-10-CM

## 2024-12-09 DIAGNOSIS — Z91199 Patient's noncompliance with other medical treatment and regimen due to unspecified reason: Secondary | ICD-10-CM

## 2024-12-10 ENCOUNTER — Telehealth: Payer: Self-pay

## 2024-12-10 NOTE — Telephone Encounter (Signed)
 Anne [name] w/ [facility] was advised to move forward with PT orders.

## 2024-12-10 NOTE — Telephone Encounter (Signed)
 Copied from CRM #[id number]. Topic: Clinical - Home Health Verbal Orders >> Dec 10, 2024 11:14 AM Rea ORN wrote: Caller/Agency: Lorie/ [facility] Callback Number: [phone] Service Requested: Physical Therapy Frequency: 1 week 1, 2 week 2, and 1 week 6 Any new concerns about the patient? No

## 2024-12-11 NOTE — Telephone Encounter (Unsigned)
 Copied from CRM #[id number]. Topic: Clinical - Home Health Verbal Orders >> Dec 11, 2024  1:44 PM Terri MATSU wrote: Caller/Agency: Signe from [facility] Callback Number: [phone] Service Requested: Occupational Therapy Frequency: 1week 4 Any new concerns about the patient? No

## 2024-12-11 NOTE — Telephone Encounter (Signed)
 Jim w/ [facility] was advised to move forward with OT orders.

## 2024-12-15 NOTE — Telephone Encounter (Signed)
 Anne [name] w/ [facility] was calling to make changes to her PT verbal order: 1 week 2, 2 week 2, 1 week 4.

## 2024-12-23 ENCOUNTER — Ambulatory Visit: Admitting: Student

## 2025-08-17 ENCOUNTER — Ambulatory Visit
